# Patient Record
Sex: Male | Born: 1945 | Race: White | Hispanic: No | Marital: Married | Smoking: Former smoker
Health system: Southern US, Community
[De-identification: ages and names within clinical notes are randomized; demographics above are authoritative.]

## PROBLEM LIST (undated history)

## (undated) DIAGNOSIS — E7800 Pure hypercholesterolemia, unspecified: Secondary | ICD-10-CM

## (undated) DIAGNOSIS — R040 Epistaxis: Secondary | ICD-10-CM

## (undated) DIAGNOSIS — F419 Anxiety disorder, unspecified: Secondary | ICD-10-CM

## (undated) DIAGNOSIS — I10 Essential (primary) hypertension: Secondary | ICD-10-CM

## (undated) DIAGNOSIS — E669 Obesity, unspecified: Secondary | ICD-10-CM

## (undated) DIAGNOSIS — M1990 Unspecified osteoarthritis, unspecified site: Secondary | ICD-10-CM

## (undated) DIAGNOSIS — J439 Emphysema, unspecified: Secondary | ICD-10-CM

## (undated) DIAGNOSIS — R7303 Prediabetes: Secondary | ICD-10-CM

## (undated) DIAGNOSIS — Z8709 Personal history of other diseases of the respiratory system: Secondary | ICD-10-CM

## (undated) DIAGNOSIS — F17210 Nicotine dependence, cigarettes, uncomplicated: Secondary | ICD-10-CM

## (undated) DIAGNOSIS — I2510 Atherosclerotic heart disease of native coronary artery without angina pectoris: Secondary | ICD-10-CM

## (undated) DIAGNOSIS — C679 Malignant neoplasm of bladder, unspecified: Secondary | ICD-10-CM

## (undated) DIAGNOSIS — Z86010 Personal history of colonic polyps: Secondary | ICD-10-CM

## (undated) DIAGNOSIS — J449 Chronic obstructive pulmonary disease, unspecified: Secondary | ICD-10-CM

## (undated) HISTORY — DX: Personal history of other diseases of the respiratory system: Z87.09

## (undated) HISTORY — DX: Essential (primary) hypertension: I10

## (undated) HISTORY — DX: Chronic obstructive pulmonary disease, unspecified: J44.9

## (undated) HISTORY — DX: Pure hypercholesterolemia, unspecified: E78.00

## (undated) HISTORY — DX: Malignant neoplasm of bladder, unspecified: C67.9

## (undated) HISTORY — PX: PTCA: SHX146

## (undated) HISTORY — DX: Anxiety disorder, unspecified: F41.9

## (undated) HISTORY — DX: Unspecified osteoarthritis, unspecified site: M19.90

## (undated) HISTORY — DX: Prediabetes: R73.03

## (undated) HISTORY — DX: Personal history of colonic polyps: Z86.010

## (undated) HISTORY — DX: Epistaxis: R04.0

## (undated) HISTORY — DX: Nicotine dependence, cigarettes, uncomplicated: F17.210

## (undated) HISTORY — DX: Obesity, unspecified: E66.9

## (undated) HISTORY — DX: Atherosclerotic heart disease of native coronary artery without angina pectoris: I25.10

## (undated) HISTORY — DX: Emphysema, unspecified: J43.9

---

## 1994-11-15 HISTORY — PX: OTHER SURGICAL HISTORY: SHX169

## 1998-04-26 ENCOUNTER — Inpatient Hospital Stay (HOSPITAL_COMMUNITY): Admission: EM | Admit: 1998-04-26 | Discharge: 1998-05-01 | Payer: Self-pay | Admitting: Emergency Medicine

## 1998-07-10 ENCOUNTER — Ambulatory Visit: Admission: RE | Admit: 1998-07-10 | Discharge: 1998-07-10 | Payer: Self-pay | Admitting: Pulmonary Disease

## 2002-11-15 DIAGNOSIS — Z86010 Personal history of colonic polyps: Secondary | ICD-10-CM

## 2002-11-15 HISTORY — DX: Personal history of colonic polyps: Z86.010

## 2004-09-24 ENCOUNTER — Ambulatory Visit: Payer: Self-pay | Admitting: Gastroenterology

## 2004-10-20 ENCOUNTER — Ambulatory Visit: Payer: Self-pay | Admitting: Pulmonary Disease

## 2004-12-16 ENCOUNTER — Ambulatory Visit: Payer: Self-pay | Admitting: Pulmonary Disease

## 2005-01-14 ENCOUNTER — Ambulatory Visit: Payer: Self-pay | Admitting: Pulmonary Disease

## 2005-03-19 ENCOUNTER — Ambulatory Visit: Payer: Self-pay | Admitting: Pulmonary Disease

## 2005-07-20 ENCOUNTER — Ambulatory Visit: Payer: Self-pay | Admitting: Pulmonary Disease

## 2005-11-22 ENCOUNTER — Ambulatory Visit: Payer: Self-pay | Admitting: Pulmonary Disease

## 2005-11-26 ENCOUNTER — Ambulatory Visit: Payer: Self-pay | Admitting: Pulmonary Disease

## 2006-03-22 ENCOUNTER — Ambulatory Visit: Payer: Self-pay | Admitting: Pulmonary Disease

## 2006-05-04 ENCOUNTER — Ambulatory Visit: Payer: Self-pay | Admitting: Pulmonary Disease

## 2006-09-22 ENCOUNTER — Ambulatory Visit: Payer: Self-pay | Admitting: Pulmonary Disease

## 2006-12-19 ENCOUNTER — Ambulatory Visit: Payer: Self-pay | Admitting: Pulmonary Disease

## 2006-12-19 LAB — CONVERTED CEMR LAB
ALT: 32 units/L (ref 0–40)
AST: 27 units/L (ref 0–37)
Albumin: 3.9 g/dL (ref 3.5–5.2)
Alkaline Phosphatase: 43 units/L (ref 39–117)
BUN: 14 mg/dL (ref 6–23)
Bilirubin, Direct: 0.2 mg/dL (ref 0.0–0.3)
CO2: 32 meq/L (ref 19–32)
Calcium: 9.1 mg/dL (ref 8.4–10.5)
Chloride: 105 meq/L (ref 96–112)
Creatinine, Ser: 1.1 mg/dL (ref 0.4–1.5)
GFR calc Af Amer: 88 mL/min
GFR calc non Af Amer: 72 mL/min
Glucose, Bld: 122 mg/dL — ABNORMAL HIGH (ref 70–99)
Hgb A1c MFr Bld: 6.3 % — ABNORMAL HIGH (ref 4.6–6.0)
Potassium: 4.2 meq/L (ref 3.5–5.1)
Sodium: 143 meq/L (ref 135–145)
Total Bilirubin: 0.7 mg/dL (ref 0.3–1.2)
Total Protein: 7.2 g/dL (ref 6.0–8.3)

## 2007-01-09 ENCOUNTER — Ambulatory Visit: Payer: Self-pay | Admitting: Cardiology

## 2007-01-17 ENCOUNTER — Ambulatory Visit: Payer: Self-pay

## 2007-02-06 ENCOUNTER — Ambulatory Visit: Payer: Self-pay | Admitting: Cardiology

## 2007-02-06 LAB — CONVERTED CEMR LAB
BUN: 17 mg/dL (ref 6–23)
Basophils Absolute: 0 10*3/uL (ref 0.0–0.1)
Basophils Relative: 0.3 % (ref 0.0–1.0)
CO2: 27 meq/L (ref 19–32)
Calcium: 9.5 mg/dL (ref 8.4–10.5)
Chloride: 109 meq/L (ref 96–112)
Creatinine, Ser: 1 mg/dL (ref 0.4–1.5)
Eosinophils Absolute: 0.2 10*3/uL (ref 0.0–0.6)
Eosinophils Relative: 3.4 % (ref 0.0–5.0)
GFR calc Af Amer: 98 mL/min
GFR calc non Af Amer: 81 mL/min
Glucose, Bld: 121 mg/dL — ABNORMAL HIGH (ref 70–99)
HCT: 45.3 % (ref 39.0–52.0)
Hemoglobin: 15.4 g/dL (ref 13.0–17.0)
INR: 1 (ref 0.9–2.0)
Lymphocytes Relative: 28.6 % (ref 12.0–46.0)
MCHC: 34 g/dL (ref 30.0–36.0)
MCV: 96.9 fL (ref 78.0–100.0)
Monocytes Absolute: 0.6 10*3/uL (ref 0.2–0.7)
Monocytes Relative: 9.3 % (ref 3.0–11.0)
Neutro Abs: 3.9 10*3/uL (ref 1.4–7.7)
Neutrophils Relative %: 58.4 % (ref 43.0–77.0)
Platelets: 243 10*3/uL (ref 150–400)
Potassium: 4.5 meq/L (ref 3.5–5.1)
Prothrombin Time: 11.8 s (ref 10.0–14.0)
RBC: 4.68 M/uL (ref 4.22–5.81)
RDW: 12.9 % (ref 11.5–14.6)
Sodium: 146 meq/L — ABNORMAL HIGH (ref 135–145)
WBC: 6.6 10*3/uL (ref 4.5–10.5)
aPTT: 25.3 s — ABNORMAL LOW (ref 26.5–36.5)

## 2007-02-10 ENCOUNTER — Inpatient Hospital Stay (HOSPITAL_BASED_OUTPATIENT_CLINIC_OR_DEPARTMENT_OTHER): Admission: RE | Admit: 2007-02-10 | Discharge: 2007-02-10 | Payer: Self-pay | Admitting: Cardiology

## 2007-02-10 ENCOUNTER — Ambulatory Visit: Payer: Self-pay | Admitting: Cardiology

## 2007-02-14 ENCOUNTER — Ambulatory Visit (HOSPITAL_COMMUNITY): Admission: RE | Admit: 2007-02-14 | Discharge: 2007-02-15 | Payer: Self-pay | Admitting: Cardiology

## 2007-02-14 ENCOUNTER — Ambulatory Visit: Payer: Self-pay | Admitting: Cardiology

## 2007-02-23 ENCOUNTER — Ambulatory Visit: Payer: Self-pay | Admitting: Cardiology

## 2007-04-11 ENCOUNTER — Ambulatory Visit: Payer: Self-pay | Admitting: Pulmonary Disease

## 2007-07-18 ENCOUNTER — Ambulatory Visit: Payer: Self-pay

## 2007-07-25 ENCOUNTER — Ambulatory Visit: Payer: Self-pay | Admitting: Cardiology

## 2007-08-15 ENCOUNTER — Ambulatory Visit: Payer: Self-pay | Admitting: Pulmonary Disease

## 2007-08-15 LAB — CONVERTED CEMR LAB
ALT: 22 units/L (ref 0–53)
AST: 22 units/L (ref 0–37)
Albumin: 3.8 g/dL (ref 3.5–5.2)
Alkaline Phosphatase: 55 units/L (ref 39–117)
BUN: 13 mg/dL (ref 6–23)
Basophils Absolute: 0 10*3/uL (ref 0.0–0.1)
Basophils Relative: 0.3 % (ref 0.0–1.0)
Bilirubin, Direct: 0.2 mg/dL (ref 0.0–0.3)
CO2: 26 meq/L (ref 19–32)
Calcium: 9.2 mg/dL (ref 8.4–10.5)
Chloride: 105 meq/L (ref 96–112)
Cholesterol: 151 mg/dL (ref 0–200)
Creatinine, Ser: 0.8 mg/dL (ref 0.4–1.5)
Eosinophils Absolute: 0.1 10*3/uL (ref 0.0–0.6)
Eosinophils Relative: 2 % (ref 0.0–5.0)
GFR calc Af Amer: 126 mL/min
GFR calc non Af Amer: 104 mL/min
Glucose, Bld: 100 mg/dL — ABNORMAL HIGH (ref 70–99)
HCT: 38.7 % — ABNORMAL LOW (ref 39.0–52.0)
HDL: 23.1 mg/dL — ABNORMAL LOW (ref >39.0)
Hemoglobin: 13.6 g/dL (ref 13.0–17.0)
Hgb A1c MFr Bld: 6.1 % — ABNORMAL HIGH (ref 4.6–6.0)
LDL Cholesterol: 107 mg/dL — ABNORMAL HIGH (ref 0–99)
Lymphocytes Relative: 23.1 % (ref 12.0–46.0)
MCHC: 35.1 g/dL (ref 30.0–36.0)
MCV: 95.7 fL (ref 78.0–100.0)
Monocytes Absolute: 0.5 10*3/uL (ref 0.2–0.7)
Monocytes Relative: 8.3 % (ref 3.0–11.0)
Neutro Abs: 4.5 10*3/uL (ref 1.4–7.7)
Neutrophils Relative %: 66.3 % (ref 43.0–77.0)
PSA: 0.48 ng/mL (ref 0.10–4.00)
Platelets: 288 10*3/uL (ref 150–400)
Potassium: 3.7 meq/L (ref 3.5–5.1)
RBC: 4.04 M/uL — ABNORMAL LOW (ref 4.22–5.81)
RDW: 12.9 % (ref 11.5–14.6)
Sodium: 139 meq/L (ref 135–145)
Total Bilirubin: 1 mg/dL (ref 0.3–1.2)
Total CHOL/HDL Ratio: 6.5
Total Protein: 7.5 g/dL (ref 6.0–8.3)
Triglycerides: 106 mg/dL (ref 0–149)
VLDL: 21 mg/dL (ref 0–40)
WBC: 6.6 10*3/uL (ref 4.5–10.5)

## 2007-12-18 DIAGNOSIS — R7301 Impaired fasting glucose: Secondary | ICD-10-CM | POA: Insufficient documentation

## 2007-12-18 DIAGNOSIS — K2990 Gastroduodenitis, unspecified, without bleeding: Secondary | ICD-10-CM

## 2007-12-18 DIAGNOSIS — I10 Essential (primary) hypertension: Secondary | ICD-10-CM | POA: Insufficient documentation

## 2007-12-18 DIAGNOSIS — F419 Anxiety disorder, unspecified: Secondary | ICD-10-CM

## 2007-12-18 DIAGNOSIS — E669 Obesity, unspecified: Secondary | ICD-10-CM

## 2007-12-18 DIAGNOSIS — E7800 Pure hypercholesterolemia, unspecified: Secondary | ICD-10-CM

## 2007-12-18 DIAGNOSIS — D126 Benign neoplasm of colon, unspecified: Secondary | ICD-10-CM

## 2007-12-18 DIAGNOSIS — K2970 Gastritis, unspecified, without bleeding: Secondary | ICD-10-CM | POA: Insufficient documentation

## 2007-12-19 ENCOUNTER — Ambulatory Visit: Payer: Self-pay | Admitting: Pulmonary Disease

## 2007-12-19 DIAGNOSIS — I2510 Atherosclerotic heart disease of native coronary artery without angina pectoris: Secondary | ICD-10-CM | POA: Insufficient documentation

## 2007-12-19 DIAGNOSIS — J449 Chronic obstructive pulmonary disease, unspecified: Secondary | ICD-10-CM

## 2007-12-19 DIAGNOSIS — J019 Acute sinusitis, unspecified: Secondary | ICD-10-CM

## 2008-01-11 ENCOUNTER — Ambulatory Visit: Payer: Self-pay | Admitting: Gastroenterology

## 2008-03-14 ENCOUNTER — Telehealth (INDEPENDENT_AMBULATORY_CARE_PROVIDER_SITE_OTHER): Payer: Self-pay | Admitting: *Deleted

## 2008-06-20 ENCOUNTER — Ambulatory Visit: Payer: Self-pay | Admitting: Pulmonary Disease

## 2008-06-20 LAB — CONVERTED CEMR LAB
ALT: 31 units/L (ref 0–53)
AST: 28 units/L (ref 0–37)
Albumin: 4.2 g/dL (ref 3.5–5.2)
Alkaline Phosphatase: 40 units/L (ref 39–117)
BUN: 19 mg/dL (ref 6–23)
Basophils Absolute: 0.1 10*3/uL (ref 0.0–0.1)
Basophils Relative: 0.8 % (ref 0.0–3.0)
Bilirubin, Direct: 0.2 mg/dL (ref 0.0–0.3)
CO2: 26 meq/L (ref 19–32)
Calcium: 9.7 mg/dL (ref 8.4–10.5)
Chloride: 103 meq/L (ref 96–112)
Cholesterol: 142 mg/dL (ref 0–200)
Creatinine, Ser: 0.9 mg/dL (ref 0.4–1.5)
Eosinophils Absolute: 0.2 10*3/uL (ref 0.0–0.7)
Eosinophils Relative: 1.8 % (ref 0.0–5.0)
GFR calc Af Amer: 110 mL/min
GFR calc non Af Amer: 91 mL/min
Glucose, Bld: 107 mg/dL — ABNORMAL HIGH (ref 70–99)
HCT: 40.4 % (ref 39.0–52.0)
HDL: 28.2 mg/dL — ABNORMAL LOW (ref >39.0)
Hemoglobin: 14.3 g/dL (ref 13.0–17.0)
Hgb A1c MFr Bld: 6.2 % — ABNORMAL HIGH (ref 4.6–6.0)
LDL Cholesterol: 87 mg/dL (ref 0–99)
Lymphocytes Relative: 16.2 % (ref 12.0–46.0)
MCHC: 35.3 g/dL (ref 30.0–36.0)
MCV: 95.8 fL (ref 78.0–100.0)
Monocytes Absolute: 0.5 10*3/uL (ref 0.1–1.0)
Monocytes Relative: 6.3 % (ref 3.0–12.0)
Neutro Abs: 6.2 10*3/uL (ref 1.4–7.7)
Neutrophils Relative %: 74.9 % (ref 43.0–77.0)
PSA: 0.33 ng/mL (ref 0.10–4.00)
Platelets: 179 10*3/uL (ref 150–400)
Potassium: 3.9 meq/L (ref 3.5–5.1)
RBC: 4.22 M/uL (ref 4.22–5.81)
RDW: 12.5 % (ref 11.5–14.6)
Sodium: 142 meq/L (ref 135–145)
TSH: 1.11 microintl units/mL (ref 0.35–5.50)
Total Bilirubin: 1.2 mg/dL (ref 0.3–1.2)
Total CHOL/HDL Ratio: 5
Total Protein: 7.7 g/dL (ref 6.0–8.3)
Triglycerides: 135 mg/dL (ref 0–149)
VLDL: 27 mg/dL (ref 0–40)
WBC: 8.4 10*3/uL (ref 4.5–10.5)

## 2008-07-24 ENCOUNTER — Ambulatory Visit: Payer: Self-pay | Admitting: Cardiology

## 2008-12-17 ENCOUNTER — Telehealth (INDEPENDENT_AMBULATORY_CARE_PROVIDER_SITE_OTHER): Payer: Self-pay | Admitting: *Deleted

## 2008-12-20 ENCOUNTER — Ambulatory Visit: Payer: Self-pay | Admitting: Pulmonary Disease

## 2009-04-21 ENCOUNTER — Ambulatory Visit: Payer: Self-pay | Admitting: Pulmonary Disease

## 2009-04-21 LAB — CONVERTED CEMR LAB: Vit D, 25-Hydroxy: 54 ng/mL (ref 30–89)

## 2009-04-26 LAB — CONVERTED CEMR LAB
ALT: 29 units/L (ref 0–53)
AST: 29 units/L (ref 0–37)
Albumin: 4.2 g/dL (ref 3.5–5.2)
Alkaline Phosphatase: 44 units/L (ref 39–117)
BUN: 13 mg/dL (ref 6–23)
Basophils Absolute: 0 10*3/uL (ref 0.0–0.1)
Basophils Relative: 0.1 % (ref 0.0–3.0)
Bilirubin, Direct: 0.2 mg/dL (ref 0.0–0.3)
CO2: 29 meq/L (ref 19–32)
Calcium: 8.9 mg/dL (ref 8.4–10.5)
Chloride: 107 meq/L (ref 96–112)
Cholesterol: 151 mg/dL (ref 0–200)
Creatinine, Ser: 0.8 mg/dL (ref 0.4–1.5)
Eosinophils Absolute: 0.2 10*3/uL (ref 0.0–0.7)
Eosinophils Relative: 2.7 % (ref 0.0–5.0)
GFR calc non Af Amer: 103.43 mL/min (ref >60)
Glucose, Bld: 108 mg/dL — ABNORMAL HIGH (ref 70–99)
HCT: 43 % (ref 39.0–52.0)
HDL: 30.6 mg/dL — ABNORMAL LOW (ref >39.00)
Hemoglobin: 15.1 g/dL (ref 13.0–17.0)
Hgb A1c MFr Bld: 6.2 % (ref 4.6–6.5)
LDL Cholesterol: 97 mg/dL (ref 0–99)
Lymphocytes Relative: 25.7 % (ref 12.0–46.0)
Lymphs Abs: 1.6 10*3/uL (ref 0.7–4.0)
MCHC: 35 g/dL (ref 30.0–36.0)
MCV: 97.3 fL (ref 78.0–100.0)
Monocytes Absolute: 0.6 10*3/uL (ref 0.1–1.0)
Monocytes Relative: 10.1 % (ref 3.0–12.0)
Neutro Abs: 3.7 10*3/uL (ref 1.4–7.7)
Neutrophils Relative %: 61.4 % (ref 43.0–77.0)
PSA: 0.44 ng/mL (ref 0.10–4.00)
Platelets: 198 10*3/uL (ref 150.0–400.0)
Potassium: 3.7 meq/L (ref 3.5–5.1)
RBC: 4.42 M/uL (ref 4.22–5.81)
RDW: 12.6 % (ref 11.5–14.6)
Sodium: 143 meq/L (ref 135–145)
TSH: 1.15 microintl units/mL (ref 0.35–5.50)
Total Bilirubin: 1.3 mg/dL — ABNORMAL HIGH (ref 0.3–1.2)
Total CHOL/HDL Ratio: 5
Total Protein: 7.9 g/dL (ref 6.0–8.3)
Triglycerides: 117 mg/dL (ref 0.0–149.0)
VLDL: 23.4 mg/dL (ref 0.0–40.0)
WBC: 6.1 10*3/uL (ref 4.5–10.5)

## 2009-04-30 ENCOUNTER — Encounter (INDEPENDENT_AMBULATORY_CARE_PROVIDER_SITE_OTHER): Payer: Self-pay | Admitting: *Deleted

## 2009-07-17 ENCOUNTER — Ambulatory Visit: Payer: Self-pay | Admitting: Cardiology

## 2009-08-04 ENCOUNTER — Encounter: Payer: Self-pay | Admitting: Pulmonary Disease

## 2009-08-20 ENCOUNTER — Ambulatory Visit: Payer: Self-pay | Admitting: Pulmonary Disease

## 2009-08-20 ENCOUNTER — Telehealth (INDEPENDENT_AMBULATORY_CARE_PROVIDER_SITE_OTHER): Payer: Self-pay | Admitting: *Deleted

## 2009-08-20 ENCOUNTER — Inpatient Hospital Stay (HOSPITAL_COMMUNITY): Admission: AD | Admit: 2009-08-20 | Discharge: 2009-08-22 | Payer: Self-pay | Admitting: Pulmonary Disease

## 2009-08-20 LAB — CONVERTED CEMR LAB
Basophils Absolute: 0 10*3/uL (ref 0.0–0.1)
Basophils Relative: 0 % (ref 0.0–3.0)
Eosinophils Absolute: 0.2 10*3/uL (ref 0.0–0.7)
Eosinophils Relative: 1.7 % (ref 0.0–5.0)
HCT: 44 % (ref 39.0–52.0)
Hemoglobin: 15.1 g/dL (ref 13.0–17.0)
Lymphocytes Relative: 17.7 % (ref 12.0–46.0)
Lymphs Abs: 1.7 10*3/uL (ref 0.7–4.0)
MCHC: 34.3 g/dL (ref 30.0–36.0)
MCV: 99 fL (ref 78.0–100.0)
Monocytes Absolute: 0.6 10*3/uL (ref 0.1–1.0)
Monocytes Relative: 6.6 % (ref 3.0–12.0)
Neutro Abs: 7.1 10*3/uL (ref 1.4–7.7)
Neutrophils Relative %: 74 % (ref 43.0–77.0)
Platelets: 230 10*3/uL (ref 150.0–400.0)
RBC: 4.44 M/uL (ref 4.22–5.81)
RDW: 12.7 % (ref 11.5–14.6)
WBC: 9.6 10*3/uL (ref 4.5–10.5)

## 2009-08-22 ENCOUNTER — Encounter: Payer: Self-pay | Admitting: Pulmonary Disease

## 2009-08-27 ENCOUNTER — Ambulatory Visit: Payer: Self-pay | Admitting: Pulmonary Disease

## 2009-09-16 ENCOUNTER — Encounter: Payer: Self-pay | Admitting: Pulmonary Disease

## 2009-10-20 ENCOUNTER — Ambulatory Visit: Payer: Self-pay | Admitting: Pulmonary Disease

## 2009-10-28 ENCOUNTER — Encounter: Payer: Self-pay | Admitting: Pulmonary Disease

## 2009-11-15 DIAGNOSIS — C679 Malignant neoplasm of bladder, unspecified: Secondary | ICD-10-CM

## 2009-11-15 HISTORY — DX: Malignant neoplasm of bladder, unspecified: C67.9

## 2010-03-18 ENCOUNTER — Telehealth (INDEPENDENT_AMBULATORY_CARE_PROVIDER_SITE_OTHER): Payer: Self-pay | Admitting: *Deleted

## 2010-03-26 ENCOUNTER — Ambulatory Visit: Payer: Self-pay | Admitting: Pulmonary Disease

## 2010-03-28 DIAGNOSIS — M1990 Unspecified osteoarthritis, unspecified site: Secondary | ICD-10-CM | POA: Insufficient documentation

## 2010-03-30 ENCOUNTER — Ambulatory Visit: Payer: Self-pay | Admitting: Pulmonary Disease

## 2010-04-05 LAB — CONVERTED CEMR LAB
ALT: 42 units/L (ref 0–53)
AST: 34 units/L (ref 0–37)
Albumin: 4.1 g/dL (ref 3.5–5.2)
Alkaline Phosphatase: 42 units/L (ref 39–117)
BUN: 19 mg/dL (ref 6–23)
Basophils Absolute: 0 10*3/uL (ref 0.0–0.1)
Basophils Relative: 0.2 % (ref 0.0–3.0)
Bilirubin, Direct: 0.2 mg/dL (ref 0.0–0.3)
CO2: 29 meq/L (ref 19–32)
Calcium: 8.9 mg/dL (ref 8.4–10.5)
Chloride: 104 meq/L (ref 96–112)
Cholesterol: 144 mg/dL (ref 0–200)
Creatinine, Ser: 0.9 mg/dL (ref 0.4–1.5)
Direct LDL: 88.7 mg/dL
Eosinophils Absolute: 0.2 10*3/uL (ref 0.0–0.7)
Eosinophils Relative: 3.5 % (ref 0.0–5.0)
GFR calc non Af Amer: 94.87 mL/min (ref >60)
Glucose, Bld: 121 mg/dL — ABNORMAL HIGH (ref 70–99)
HCT: 42 % (ref 39.0–52.0)
HDL: 26 mg/dL — ABNORMAL LOW (ref >39.00)
Hemoglobin: 14.9 g/dL (ref 13.0–17.0)
Hgb A1c MFr Bld: 6.7 % — ABNORMAL HIGH (ref 4.6–6.5)
Lymphocytes Relative: 27.7 % (ref 12.0–46.0)
Lymphs Abs: 1.7 10*3/uL (ref 0.7–4.0)
MCHC: 35.4 g/dL (ref 30.0–36.0)
MCV: 97.5 fL (ref 78.0–100.0)
Monocytes Absolute: 0.7 10*3/uL (ref 0.1–1.0)
Monocytes Relative: 10.8 % (ref 3.0–12.0)
Neutro Abs: 3.5 10*3/uL (ref 1.4–7.7)
Neutrophils Relative %: 57.8 % (ref 43.0–77.0)
PSA: 0.3 ng/mL (ref 0.10–4.00)
Platelets: 198 10*3/uL (ref 150.0–400.0)
Potassium: 4.9 meq/L (ref 3.5–5.1)
RBC: 4.31 M/uL (ref 4.22–5.81)
RDW: 13.6 % (ref 11.5–14.6)
Sodium: 139 meq/L (ref 135–145)
TSH: 1.65 microintl units/mL (ref 0.35–5.50)
Total Bilirubin: 1 mg/dL (ref 0.3–1.2)
Total CHOL/HDL Ratio: 6
Total Protein: 7.2 g/dL (ref 6.0–8.3)
Triglycerides: 215 mg/dL — ABNORMAL HIGH (ref 0.0–149.0)
VLDL: 43 mg/dL — ABNORMAL HIGH (ref 0.0–40.0)
WBC: 6.1 10*3/uL (ref 4.5–10.5)

## 2010-05-14 ENCOUNTER — Ambulatory Visit: Payer: Self-pay | Admitting: Pulmonary Disease

## 2010-05-14 ENCOUNTER — Telehealth (INDEPENDENT_AMBULATORY_CARE_PROVIDER_SITE_OTHER): Payer: Self-pay | Admitting: *Deleted

## 2010-05-14 ENCOUNTER — Ambulatory Visit: Payer: Self-pay | Admitting: Internal Medicine

## 2010-05-14 DIAGNOSIS — R319 Hematuria, unspecified: Secondary | ICD-10-CM

## 2010-05-14 LAB — CONVERTED CEMR LAB
Bilirubin Urine: NEGATIVE
Ketones, ur: NEGATIVE mg/dL
Leukocytes, UA: NEGATIVE
Nitrite: NEGATIVE
Specific Gravity, Urine: 1.015 (ref 1.000–1.030)
Total Protein, Urine: 30 mg/dL
Urine Glucose: NEGATIVE mg/dL
Urobilinogen, UA: 0.2 (ref 0.0–1.0)
pH: 8 (ref 5.0–8.0)

## 2010-05-20 ENCOUNTER — Ambulatory Visit: Payer: Self-pay | Admitting: Pulmonary Disease

## 2010-05-22 LAB — CONVERTED CEMR LAB
Bilirubin Urine: NEGATIVE
Ketones, ur: NEGATIVE mg/dL
Leukocytes, UA: NEGATIVE
Nitrite: NEGATIVE
Specific Gravity, Urine: <=1.005 (ref 1.000–1.030)
Total Protein, Urine: NEGATIVE mg/dL
Urine Glucose: NEGATIVE mg/dL
Urobilinogen, UA: 0.2 (ref 0.0–1.0)
pH: 7 (ref 5.0–8.0)

## 2010-06-30 ENCOUNTER — Ambulatory Visit: Payer: Self-pay | Admitting: Pulmonary Disease

## 2010-06-30 LAB — CONVERTED CEMR LAB
Bilirubin Urine: NEGATIVE
Ketones, ur: NEGATIVE mg/dL
Leukocytes, UA: NEGATIVE
Nitrite: NEGATIVE
Specific Gravity, Urine: <=1.005 (ref 1.000–1.030)
Total Protein, Urine: NEGATIVE mg/dL
Urine Glucose: NEGATIVE mg/dL
Urobilinogen, UA: 0.2 (ref 0.0–1.0)
pH: 7 (ref 5.0–8.0)

## 2010-07-02 ENCOUNTER — Telehealth (INDEPENDENT_AMBULATORY_CARE_PROVIDER_SITE_OTHER): Payer: Self-pay | Admitting: *Deleted

## 2010-07-02 ENCOUNTER — Encounter: Payer: Self-pay | Admitting: Adult Health

## 2010-07-15 ENCOUNTER — Encounter: Payer: Self-pay | Admitting: Adult Health

## 2010-07-16 ENCOUNTER — Telehealth: Payer: Self-pay | Admitting: Adult Health

## 2010-07-17 ENCOUNTER — Ambulatory Visit: Payer: Self-pay | Admitting: Cardiology

## 2010-07-24 ENCOUNTER — Ambulatory Visit (HOSPITAL_BASED_OUTPATIENT_CLINIC_OR_DEPARTMENT_OTHER): Admission: RE | Admit: 2010-07-24 | Discharge: 2010-07-24 | Payer: Self-pay | Admitting: Urology

## 2010-08-03 ENCOUNTER — Encounter: Payer: Self-pay | Admitting: Adult Health

## 2010-09-07 ENCOUNTER — Telehealth (INDEPENDENT_AMBULATORY_CARE_PROVIDER_SITE_OTHER): Payer: Self-pay | Admitting: *Deleted

## 2010-09-14 ENCOUNTER — Ambulatory Visit (HOSPITAL_BASED_OUTPATIENT_CLINIC_OR_DEPARTMENT_OTHER): Admission: RE | Admit: 2010-09-14 | Discharge: 2010-09-14 | Payer: Self-pay | Admitting: Urology

## 2010-09-21 ENCOUNTER — Encounter: Payer: Self-pay | Admitting: Adult Health

## 2010-09-22 ENCOUNTER — Ambulatory Visit: Payer: Self-pay | Admitting: Pulmonary Disease

## 2010-09-22 DIAGNOSIS — C679 Malignant neoplasm of bladder, unspecified: Secondary | ICD-10-CM

## 2010-11-27 ENCOUNTER — Ambulatory Visit
Admission: RE | Admit: 2010-11-27 | Discharge: 2010-11-27 | Payer: Self-pay | Source: Home / Self Care | Attending: Urology | Admitting: Urology

## 2010-11-30 LAB — POCT I-STAT 4, (NA,K, GLUC, HGB,HCT)
Glucose, Bld: 112 mg/dL — ABNORMAL HIGH (ref 70–99)
HCT: 45 % (ref 39.0–52.0)
Hemoglobin: 15.3 g/dL (ref 13.0–17.0)
Potassium: 4.4 mEq/L (ref 3.5–5.1)
Sodium: 143 mEq/L (ref 135–145)

## 2010-12-08 ENCOUNTER — Encounter: Payer: Self-pay | Admitting: Adult Health

## 2010-12-17 NOTE — Assessment & Plan Note (Signed)
Summary: NP follow up - hematuria   Primary Provider/Referring Provider:  Kriste Basque, MD  CC:  5 day follow up - states symptoms have completely resolved.  History of Present Illness: 65 yo male with known hx of HTN, CAD    ~  Dec10:  he's been stable- no renewed epistaxis etc... he saw DrWolicki who rec a sleep study, but pt cancelled this... he tells me he is trying the "e-cigarette" & cut down his regular cigs to 1/4ppd... BP controlled, back on Plavix now and no angina   ~  Mar 26, 2010:  stable 33mo- but still smoking 5-10 cig/d, hasn't lost weight (actually gained 6#), not exercising etc... BP controlled- denies CP,worsening dyspnea, etc... due for CXR & fasting labs...  May 14, 2010--Presents for work in visit today. Had episode of bloody urine x1 episode yesterday with extreme pain, and states has had some a lesser degree of bloody urine since with small blood clots 4-5x with no pain.  also some right kidney achiness since onset. No discharge, dysuria, abd pain, n/v. Has seen blood in urine today but not as much pain. UA today positive for blood   May 21, 2010--Returns for 5 day follow up - states symptoms have completely resolved. Was seen last visit w/ gross hematuria. KUB neg for renal calculi. UA positive for bld. He was tx for possible UTI +/- kidney stone. w/ fluids and cipro. He is much improved w/ no futher visible blood. He says shortly after leaving our office last week, he urinated w/ passage of small pebble w/ resolution of pain and hematuria.  Denies chest pain, dyspnea, orthopnea, hemoptysis, fever, n/v/d, edema, headache,back pain .   Medications Prior to Update: 1)  Nasonex 50 Mcg/act  Susp (Mometasone Furoate) .... 2 Spray Each Nostril Two Times A Day 2)  Advair Diskus 250-50 Mcg/dose  Misc (Fluticasone-Salmeterol) .... I Puff Two Times A Day, Rinse Mouth 3)  Bufferin Low Dose 81 Mg Tbec (Aspirin) .... Take 1 Tab By Mouth Once Daily.Marland KitchenMarland Kitchen 4)  Plavix 75 Mg  Tabs (Clopidogrel  Bisulfate) .... Take 1 Tab By Mouth Once Daily.Marland KitchenMarland Kitchen 5)  Nitroglycerin 0.4 Mg Subl (Nitroglycerin) .... One Tablet Under Tongue Every 5 Minutes As Needed For Chest Pain---May Repeat Times Three 6)  Metoprolol Tartrate 25 Mg  Tabs (Metoprolol Tartrate) .... Take One Tablet By Mouth Three Times A Day 7)  Amlodipine Besylate 10 Mg Tabs (Amlodipine Besylate) .... Take 1 Tablet By Mouth Once A Day 8)  Lisinopril 40 Mg Tabs (Lisinopril) .... Take 1 Tablet By Mouth Once A Day 9)  Simvastatin 80 Mg  Tabs (Simvastatin) .... Take 1 Tablet By Mouth Once A Day 10)  Fish Oil 1200 Mg  Caps (Omega-3 Fatty Acids) .... Take 1 Tablet By Mouth Once A Day 11)  Flaxseed Oil 1000 Mg  Caps (Flaxseed (Linseed)) .... Take 1 Tablet By Mouth Once A Day 12)  Ranitidine Hcl 300 Mg Caps (Ranitidine Hcl) .... Take 1 Tab By Mouth Once Daily For Stomach Acid... 13)  Multivitamins   Tabs (Multiple Vitamin) .... Take 1 Tablet By Mouth Once A Day 14)  Clorazepate Dipotassium 7.5 Mg  Tabs (Clorazepate Dipotassium) .... Take 3 Tablet By Mouth As Needed 15)  Icaps  Caps (Multiple Vitamins-Minerals) .... Take One Capsule By Mouth Two Times A Day 16)  Tylenol Arthritis Pain 650 Mg Cr-Tabs (Acetaminophen) .... Per Bottle 17)  Cipro 500 Mg Tabs (Ciprofloxacin Hcl) .[name].. 1 By Mouth Two Times A Day  Current  Medications (verified): 1)  Nasonex 50 Mcg/act  Susp (Mometasone Furoate) .... 2 Spray Each Nostril Two Times A Day 2)  Advair Diskus 250-50 Mcg/dose  Misc (Fluticasone-Salmeterol) .... I Puff Two Times A Day, Rinse Mouth 3)  Bufferin Low Dose 81 Mg Tbec (Aspirin) .... Take 1 Tab By Mouth Once Daily.Marland KitchenMarland Kitchen 4)  Plavix 75 Mg  Tabs (Clopidogrel Bisulfate) .... Take 1 Tab By Mouth Once Daily.Marland KitchenMarland Kitchen 5)  Nitroglycerin 0.4 Mg Subl (Nitroglycerin) .... One Tablet Under Tongue Every 5 Minutes As Needed For Chest Pain---May Repeat Times Three 6)  Metoprolol Tartrate 25 Mg  Tabs (Metoprolol Tartrate) .... Take One Tablet By Mouth Three Times A Day 7)   Amlodipine Besylate 10 Mg Tabs (Amlodipine Besylate) .... Take 1 Tablet By Mouth Once A Day 8)  Lisinopril 40 Mg Tabs (Lisinopril) .... Take 1 Tablet By Mouth Once A Day 9)  Simvastatin 80 Mg  Tabs (Simvastatin) .... Take 1 Tablet By Mouth Once A Day 10)  Fish Oil 1200 Mg  Caps (Omega-3 Fatty Acids) .... Take 1 Tablet By Mouth Once A Day 11)  Flaxseed Oil 1000 Mg  Caps (Flaxseed (Linseed)) .... Take 1 Tablet By Mouth Once A Day 12)  Ranitidine Hcl 300 Mg Caps (Ranitidine Hcl) .... Take 1 Tab By Mouth Once Daily For Stomach Acid... 13)  Multivitamins   Tabs (Multiple Vitamin) .... Take 1 Tablet By Mouth Once A Day 14)  Clorazepate Dipotassium 7.5 Mg  Tabs (Clorazepate Dipotassium) .... Take 3 Tablet By Mouth As Needed 15)  Icaps  Caps (Multiple Vitamins-Minerals) .... Take One Capsule By Mouth Two Times A Day 16)  Tylenol Arthritis Pain 650 Mg Cr-Tabs (Acetaminophen) .... Per Bottle 17)  Cipro 500 Mg Tabs (Ciprofloxacin Hcl) .[name].. 1 By Mouth Two Times A Day  Allergies (verified): 1)  ! 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 Coronary artery disease, status post remote diaphragmatic wall     infarction in 1995 treated with stenting in the right coronary     artery with subsequent overlapping stents placed in the right     coronary artery and recently 3 non-overlapping Cypher stents placed     in the proximal mid and distal right coronary artery in 2008 --total 6 stents 2. Good left ventricular function. 3. Hypertension. 4. Hyperlipidemia. 5. Tobacco use.   Past Surgical  History: Last updated: 03/26/2010 S/P Cataract Surg & subseq posterior vitrectomy in Left eye by DrRankin 1996  Family History: Last updated: 07/04/08 mother deceased age 70  from heart problems father deceased age 35 from cancer 1 sibling alive age 89 1 sibling alive age 74  Social History: Last updated: 04-Jul-2008 current smoker  1/2 ppd exercises with work drinks caffeine--3-4 cups per day  no etoh married 2 children  Risk Factors: Smoking Status: current (12/18/2007)  Review of Systems      See HPI  Vital Signs:  Patient profile:   65 year old male Height:      67 inches Weight:      242 pounds BMI:     38.04 O2 Sat:      93 % on Room air Temp:     98.7 degrees F oral Pulse rate:   76 / minute BP sitting:  104 / 72  (left arm) Cuff size:   regular  Vitals Entered By: Boone Master CNA/MA (May 20, 2010 10:14 AM)  O2 Flow:  Room air CC: 5 day follow up - states symptoms have completely resolved Is Patient Diabetic? No Comments Medications reviewed with patient Daytime contact number verified with patient. Boone Master CNA/MA  May 20, 2010 10:15 AM    Physical Exam  Additional Exam:  General: well developed adult HEENT: [place]/AT.  Mucus membranes pink/moist.  No JVD.  No lymphadenopathy.    Neuro: Awake, alert, oriented.  CV: S1S2 RRR, no murmurs/rubs/gallops.  Radial pulses +2.   Pulm: Resp's shallow, tachypnic.  Lungs bilaterally diminished.  GI: abdomen round/soft, non-tender to palpation.  BSx4 active. No organomegaly., neg CVA tenderness. no guarding or rebound.  Skin: no rashes or lesions Ext: warm/dry.  No edema      Impression & Recommendations:  Problem # 1:  HEMATURIA UNSPECIFIED (ICD-599.70) Suspect secondary to renal calculi (w/ visible passage of stone"pebble".  UA shows some residual blood will need to have follow up UA in 1-2 months to verifiy clearance.  REC:  kidney stone education given.  advised on increaesing fluids.  follow  up for repeat urine in 4 week.    Orders: TLB-Udip ONLY ([id number]) Est. Patient Level II (47829)  Complete Medication List: 1)  Nasonex 50 Mcg/act Susp (Mometasone furoate) .... 2 spray each nostril two times a day 2)  Advair Diskus 250-50 Mcg/dose Misc (Fluticasone-salmeterol) .... I puff two times a day, rinse mouth 3)  Bufferin Low Dose 81 Mg Tbec (Aspirin) .... Take 1 tab by mouth once daily.Marland KitchenMarland Kitchen 4)  Plavix 75 Mg Tabs (Clopidogrel bisulfate) .... Take 1 tab by mouth once daily.Marland KitchenMarland Kitchen 5)  Nitroglycerin 0.4 Mg Subl (Nitroglycerin) .... One tablet under tongue every 5 minutes as needed for chest pain---may repeat times three 6)  Metoprolol Tartrate 25 Mg Tabs (Metoprolol tartrate) .... Take one tablet by mouth three times a day 7)  Amlodipine Besylate 10 Mg Tabs (Amlodipine besylate) .... Take 1 tablet by mouth once a day 8)  Lisinopril 40 Mg Tabs (Lisinopril) .... Take 1 tablet by mouth once a day 9)  Simvastatin 80 Mg Tabs (Simvastatin) .... Take 1 tablet by mouth once a day 10)  Fish Oil 1200 Mg Caps (Omega-3 fatty acids) .... Take 1 tablet by mouth once a day 11)  Flaxseed Oil 1000 Mg Caps (Flaxseed (linseed)) .... Take 1 tablet by mouth once a day 12)  Ranitidine Hcl 300 Mg Caps (Ranitidine hcl) .... Take 1 tab by mouth once daily for stomach acid... 13)  Multivitamins Tabs (Multiple vitamin) .... Take 1 tablet by mouth once a day 14)  Clorazepate Dipotassium 7.5 Mg Tabs (Clorazepate dipotassium) .... Take 3 tablet by mouth as needed 15)  Icaps Caps (Multiple vitamins-minerals) .... Take one capsule by mouth two times a day 16)  Tylenol Arthritis Pain 650 Mg Cr-tabs (Acetaminophen) .... Per bottle 17)  Cipro 500 Mg Tabs (Ciprofloxacin hcl) .[name].. 1 by mouth two times a day  Patient Instructions: 1)  We are repeating your urine today , I will call with results.  2)  Increase fluids, drink lots of water.  3)  Please contact office for sooner follow up if symptoms do not improve or worsen

## 2010-12-17 NOTE — Letter (Signed)
Summary: [facility]  [facility]   Imported By: Lester [place] 08/11/2010 08:29:00  _____________________________________________________________________  External Attachment:    Type:   Image     Comment:   External Document

## 2010-12-17 NOTE — Progress Notes (Signed)
Summary: hematuria/ pt in pain  Phone Note Call from Patient Call back at Home Phone [phone]   Caller: Spouse Call For: tammy parrett Summary of Call: spouse states that pt is still passing blood while urinating. today unable to urinate at all. in pain. still waiting for referral to urologist. please advise asap today.  Initial call taken by: Tivis Ringer, CNA,  July 02, 2010 11:21 AM  Follow-up for Phone Call        Called Dr. Vernie Ammons pt to be seen today at 3pm today. Pt's wife is aware to be at Dr.Ottelin's office at 245pm.Katie [facility] CMA  July 02, 2010 11:33 AM

## 2010-12-17 NOTE — Progress Notes (Signed)
Summary: talk to nurse  Phone Note Call from Patient Call back at Home Phone [phone]   Caller: Patient Call For: nadel Summary of Call: Pt states he has been passing blood since yesterday evening, pls advise.//pleasasnt garden Initial call taken by: Darletta Moll,  May 14, 2010 9:03 AM  Follow-up for Phone Call        Spoke with pt.  He states that yesterday he had the urge to urinate and had trouble with this.  When he finally did urine had blood in it.  I advised needs ov for eval.    Pt needs to come in at 2:30 to have udip and cx done.  Spoke with him and he is okay with this.  Labs ordered in IDX STAT. Follow-up by: Vernie Murders,  May 14, 2010 9:22 AM  New Problems: HEMATURIA UNSPECIFIED (ICD-599.70)   New Problems: HEMATURIA UNSPECIFIED (ICD-599.70)

## 2010-12-17 NOTE — Progress Notes (Signed)
Summary: thanks  Phone Note Call from Patient Call back at Home Phone [phone]   Caller: Spouse//rita Call For: parrett Summary of Call: Wanted to thank TP and Katheren Shams. for taking care of her husband and getting him an appt with Dr. Vernie Ammons. Initial call taken by: Darletta Moll,  July 16, 2010 3:00 PM  Follow-up for Phone Call        great  Follow-up by: Rubye Oaks NP,  July 16, 2010 3:12 PM

## 2010-12-17 NOTE — Assessment & Plan Note (Signed)
Summary: [name] ///kp   Primary Care Provider:  Kriste Basque, MD  CC:  5 month [name] & review of mult medical problems....  History of Present Illness: 65 y/o WM here for a follow up visit and on-going management of mult med problems...    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  Unfortunately he continues to smoke 1/2ppd- denies CP, palpit, or change in SOB/ DOE... no new complaints or concerns... he has been caring for his wife who had a TKR recently...  ~  Oct10:  Adm 10/10 w/ epistaxis ?hemoptysis- CXR= NAD, labs OK, seen by DrWolicki w/ rec for saline lavage.... nose bleed resolved & disch home... improved now- & he reports no further problems... he is anxious to restart his Plavix since DrBrodie stressed to him how important this med was (I indicated to him that smoking cessation was even MORE important)...  ~  Dec10:  he's been stable- no renewed epistaxis etc... he saw DrWolicki who rec a sleep study, but pt cancelled this... he tells me he is trying the "e-cigarette" & cut down his regular cigs to 1/4ppd... BP controlled, back on Plavix now and no angina reported...    ~  Mar 26, 2010:  stable 46mo- but still smoking 5-10 cig/d, hasn't lost weight (actually gained 6#), not exercising etc... BP controlled- denies CP,worsening dyspnea, etc... due for CXR & fasting labs...    Current Problem List:  Hx of SINUSITIS (ICD-473.9) - on NASONEX 2spQhs & recent Rx w/ Augmentin/ Saline... notes occas sinus congestion, drainage, sl HA, etc... he knows that he needs to quit smoking!  ~  10/10: eval for epistaxis by DrWolicki- stable, no recurrence...   COPD (ICD-496) - STILL SMOKES 5-10 cig/d as above... He's refused Chantix... on ADVAIR 250Bid... episode of hemoptysis 6/99 without lesion on CXR, CTChest, or Bronchoscopy...  ~  Adm 10/10 w/ epistaxsis & eval DrWolicki as noted...  HYPERTENSION (ICD-401.9) - controlled on METOPROLOL 25mg Tid,  NORVASC  10mg /d, LISINOPRIL 40mg /d...  BP=140/80, not checking BP's at home... wt back up to 244#... denies HA, fatigue, visual changes, CP, palipit, dizziness, syncope, dyspnea, edema, etc...   CORONARY ARTERY DISEASE (ICD-414.00) - ASA 81mg /d & PLAVIX 75mg /d as before... 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 Myoview 4/08 abnormal w/ inferior ischemia and subseq cath revealing more restenoses in RCA- s/p repeat PTCA/ stents... f/u Myoview 9/08 w/ prev infer infarct, no ischemia, EF=56%...  ~  08/27/09:  OK to restart Plavix & watch for any incr bleeding...  HYPERCHOLESTEROLEMIA (ICD-272.0) - on SIMVASTATIN 80 now + Fish Oil & Red Wine per DrBB- not really on diet, not exercising & we discussed this again today!  ~  FLP 9/08 on Simva40 showed TChol 151, TG 106, HDL 23, LDL 107  ~  FLP 8/09 showed TChol 142, TG 135, HDL 28, LDL 87  ~  FLP 6/10 showed TChol 151, TG 117, HDL 31, LDL 97  ~  FLP in hosp 10/10 showed TChol 128, TG 110, HDL 26, LDL 80  ~  FLP 5/11 showed =   DIABETES MELLITUS, BORDERLINE (ICD-790.29) - on diet alone...  ~  labs 9/08 showed BS=100, HgA1c=6.1  ~  labs 8/09 showed BS= 107, A1c= 6.2  ~  labs 6/10 showed BS= 108, A1c= 6.2  ~  labd 5/11 showed =   OBESITY (ICD-278.00) - weight was down as low as  202# in 2/09, and steadily incr to 244# now... we reviewed diet + exercise necessary to lose weight...  GASTRITIS (ICD-535.50) - had EGD 10/05 showing gastritis... on RANITADINE 300mg /d due to his Plavix Rx, off prev Omep.  COLONIC POLYPS (ICD-211.3) - last colonoscopy by DrStark 3/04 showed 7mm polyp = hyperplastic w/ f/u planned 31yrs..  DEGENERATIVE JOINT DISEASE (ICD-715.90) - he has right shoulder pain, also hip & knee pain, Rx ADVIL w/ some benefit... offered Ortho referral & he will decide.  ANXIETY (ICD-300.00) - on CHLORAZEPATE 7.5mg  Prn...   Allergies: 1)  ! * Mucinex Dm  Comments:  Nurse/Medical Assistant: The patient's medications and  allergies were reviewed with the patient and were updated in the Medication and Allergy Lists.  Past History:  Past Medical History: Hx of SINUSITIS (ICD-473.9) EPISTAXIS (ICD-784.7) COPD (ICD-496) CIGARETTE SMOKER (ICD-305.1) HYPERTENSION (ICD-401.9) CORONARY ARTERY DISEASE (ICD-414.00) HYPERCHOLESTEROLEMIA (ICD-272.0) DIABETES MELLITUS, BORDERLINE (ICD-790.29) OBESITY (ICD-278.00) GASTRITIS (ICD-535.50) COLONIC POLYPS (ICD-211.3) DEGENERATIVE JOINT DISEASE (ICD-715.90) ANXIETY (ICD-300.00)  1. Coronary artery disease, status post remote diaphragmatic wall     infarction in 1995 treated with stenting in the right coronary     artery with subsequent overlapping stents placed in the right     coronary artery and recently 3 non-overlapping Cypher stents placed     in the proximal mid and distal right coronary artery in 2008 --total 6 stents 2. Good left ventricular function. 3. Hypertension. 4. Hyperlipidemia. 5. Tobacco use.   Past Surgical History: S/P Cataract Surg & subseq posterior vitrectomy in Left eye by DrRankin 1996  Family History: Reviewed history from 06/20/2008 and no changes required. mother deceased age 42  from heart problems father deceased age 30 from cancer 1 sibling alive age 56 1 sibling alive age 33  Social History: Reviewed history from 06/20/2008 and no changes required. current smoker  1/2 ppd exercises with work drinks caffeine--3-4 cups per day  no etoh married 2 children  Review of Systems      See HPI       The patient complains of dyspnea on exertion and difficulty walking.  The patient denies anorexia, fever, weight loss, weight gain, vision loss, decreased hearing, hoarseness, chest pain, syncope, peripheral edema, prolonged cough, headaches, hemoptysis, abdominal pain, melena, hematochezia, severe indigestion/heartburn, hematuria, incontinence, muscle weakness, suspicious skin lesions, transient blindness, depression, unusual weight  change, abnormal bleeding, enlarged lymph nodes, and angioedema.    Vital Signs:  Patient profile:   65 year old male Height:      67 inches Weight:      244 pounds BMI:     38.35 O2 Sat:      94 % on Room air Temp:     97.9 degrees F oral Pulse rate:   68 / minute BP sitting:   140 / 80  (left arm) Cuff size:   large  Vitals Entered By: Randell Loop CMA (Mar 26, 2010 9:05 AM)  O2 Sat at Rest %:  94 O2 Flow:  Room air CC: 5 month [name] & review of mult medical problems... Is Patient Diabetic? Yes Pain Assessment Patient in pain? yes      Comments no changes in meds--pt pays out of pocket for his generic meds at [facility] in [place] and uses [facility] for his non-generic meds   Physical Exam  Additional Exam:  WD, Obese, 65 y/o WM in NAD... GENERAL:  Alert & oriented; pleasant & cooperative... HEENT:  [place]/AT, EOM-wnl, PERRLA, EACs-clear, TMs-wnl, NOSE- sl congestion, THROAT-clear &  wnl. NECK:  Supple w/ fairROM; no JVD; normal carotid impulses w/o bruits; no thyromegaly or nodules palpated; no lymphadenopathy. CHEST:  Clear to P & A; without wheezes/ rales/ or rhonchi heard... HEART:  Regular Rhythm; without murmurs/ rubs/ or gallops detected... ABDOMEN:  Obese, soft, & nontender; normal bowel sounds; no organomegaly or masses detected. EXT: without deformities, mild arthritic changes; no varicose veins/ +venous insuffic/ no edema. NEURO:  CN's intact; no focal neuro deficits... DERM:  No lesions noted; no rash etc...    MISC. Report  Procedure date:  03/26/2010  Findings:      CHEST - 2 VIEW Comparison: 08/21/2009   Findings: Bullous emphysema again noted.  No evidence of pulmonary infiltrate or edema.  No evidence of pleural effusion.  Heart size and mediastinal contours are within normal limits.  No mass or adenopathy identified.   IMPRESSION: Bullous emphysema.  No active disease.   Read By:  Danae Orleans,  M.D.   MISC. Report  Procedure date:   03/26/2010  Findings:      PT will return next week for fasting blood work & we will communicate the results to him...  SN   Impression & Recommendations:  Problem # 1:  COPD (ICD-496) He must quit all smoking but is not motivated to quit... we discussed smoking cessation strategies but he declines Chantix etc. His updated medication list for this problem includes:    Advair Diskus 250-50 Mcg/dose Misc (Fluticasone-salmeterol) ..... I puff two times a day, rinse mouth  Orders: T-2 View CXR (71020TC)  Problem # 2:  HYPERTENSION (ICD-401.9) Controlled>  same meds. His updated medication list for this problem includes:    Metoprolol Tartrate 25 Mg Tabs (Metoprolol tartrate) .[name]... Take one tablet by mouth three times a day    Amlodipine Besylate 10 Mg Tabs (Amlodipine besylate) .[name]... Take 1 tablet by mouth once a day    Lisinopril 40 Mg Tabs (Lisinopril) .[name]... Take 1 tablet by mouth once a day  Problem # 3:  CORONARY ARTERY DISEASE (ICD-414.00) Followed by DrBrodie>  continue same meds. His updated medication list for this problem includes:    Bufferin Low Dose 81 Mg Tbec (Aspirin) .[name]... Take 1 tab by mouth once daily...    Plavix 75 Mg Tabs (Clopidogrel bisulfate) .[name]... Take 1 tab by mouth once daily...    Nitroglycerin 0.4 Mg Subl (Nitroglycerin) ..... One tablet under tongue every 5 minutes as needed for chest pain---may repeat times three    Metoprolol Tartrate 25 Mg Tabs (Metoprolol tartrate) .[name]... Take one tablet by mouth three times a day    Amlodipine Besylate 10 Mg Tabs (Amlodipine besylate) .[name]... Take 1 tablet by mouth once a day    Lisinopril 40 Mg Tabs (Lisinopril) .[name]... Take 1 tablet by mouth once a day  Problem # 4:  HYPERCHOLESTEROLEMIA (ICD-272.0) On Simva80 w/ FLP pending>  needs better diet, get weight down... His updated medication list for this problem includes:    Simvastatin 80 Mg Tabs (Simvastatin) .[name]... Take 1 tablet by mouth once a day  Problem # 5:   DIABETES MELLITUS, BORDERLINE (ICD-790.29) On  diet alone>  needs to get weight down!!!  Problem # 6:  GASTRITIS (ICD-535.50) Continue Ranitadine in light of Plavix rx... His updated medication list for this problem includes:    Ranitidine Hcl 300 Mg Caps (Ranitidine hcl) .[name]... Take 1 tab by mouth once daily for stomach acid...  Problem # 7:  ANXIETY (ICD-300.00) Meds refilled... His updated medication list  for this problem includes:    Clorazepate Dipotassium 7.5 Mg Tabs (Clorazepate dipotassium) .[name]... Take 3 tablet by mouth as needed  Complete Medication List: 1)  Nasonex 50 Mcg/act Susp (Mometasone furoate) .... 2 spray each nostril two times a day 2)  Advair Diskus 250-50 Mcg/dose Misc (Fluticasone-salmeterol) .... I puff two times a day, rinse mouth 3)  Bufferin Low Dose 81 Mg Tbec (Aspirin) .... Take 1 tab by mouth once daily.Marland KitchenMarland Kitchen 4)  Plavix 75 Mg Tabs (Clopidogrel bisulfate) .... Take 1 tab by mouth once daily.Marland KitchenMarland Kitchen 5)  Nitroglycerin 0.4 Mg Subl (Nitroglycerin) .... One tablet under tongue every 5 minutes as needed for chest pain---may repeat times three 6)  Metoprolol Tartrate 25 Mg Tabs (Metoprolol tartrate) .... Take one tablet by mouth three times a day 7)  Amlodipine Besylate 10 Mg Tabs (Amlodipine besylate) .... Take 1 tablet by mouth once a day 8)  Lisinopril 40 Mg Tabs (Lisinopril) .... Take 1 tablet by mouth once a day 9)  Simvastatin 80 Mg Tabs (Simvastatin) .... Take 1 tablet by mouth once a day 10)  Fish Oil 1200 Mg Caps (Omega-3 fatty acids) .... Take 1 tablet by mouth once a day 11)  Flaxseed Oil 1000 Mg Caps (Flaxseed (linseed)) .... Take 1 tablet by mouth once a day 12)  Ranitidine Hcl 300 Mg Caps (Ranitidine hcl) .... Take 1 tab by mouth once daily for stomach acid... 13)  Multivitamins Tabs (Multiple vitamin) .... Take 1 tablet by mouth once a day 14)  Clorazepate Dipotassium 7.5 Mg Tabs (Clorazepate dipotassium) .... Take 3 tablet by mouth as needed 15)  Icaps Caps  (Multiple vitamins-minerals) .... Take one capsule by mouth two times a day  Patient Instructions: 1)  Today we updated your med list- see below....  2)  Continue your current meds the same... 3)  Let's get back on track w/ our diet & exercise program- the goal is to lose 15-20lbs!!! 4)  [name], you need to quit the last of those cigarettes!!! (please quit before they kill you).Marland KitchenMarland Kitchen 5)  Today we did your f/u CXR.Marland KitchenMarland Kitchen  6)  please return to our lab one morning next week for your FASTING blood work... 7)  Then call the "phone tree" in a few days for your lab results... 8)  Call for any problems.Marland KitchenMarland Kitchen 9)  Please schedule a follow-up appointment in 6 months.

## 2010-12-17 NOTE — Assessment & Plan Note (Signed)
Summary: Acute NP office visit - hematuria   Primary Provider/Referring Provider:  Kriste Basque, MD  CC:  bloody urine x1 episode yesterday with extreme pain and and states has had some a lesser degree of bloody urine since with small blood clots 4-5x with no pain.  also some right kidney achiness since onset.[name]  History of Present Illness: 65  with known history of Hyperlipidemia, DM, CAD.  August 20, 2009 --Pt presents to office with c/o coughing up blood since last pm.  Reports he fills his mouth with blood when coughing, chest burning, shortness of breath.  Also reports continuous drip of blood from nose but feels the blood is coming up from his lungs. Was in usual state of health prior to  last evening.      06/2008-- follow up visit and on-going management of mult med problems... 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  Unfortunately he continues to smoke 1/2ppd- denies CP, palpit, or change in SOB/ DOE... no new complaints or concerns...  December 20, 2008--follow up visit. Also c/o nasal congestion, drainage, cough, yellow mucous, ear fullness,. Since last visit doing wel, tolerating meds well. Has gained 15# , "not eating good". Pt is not fasing today. Denies chest pain, dyspnea, orthopnea, hemoptysis, fever, n/v/d, edema, recent travel or antibiotics.  Pt is still smoking.     ~  Dec10:  he's been stable- no renewed epistaxis etc... he saw DrWolicki who rec a sleep study, but pt cancelled this... he tells me he is trying the "e-cigarette" & cut down his regular cigs to 1/4ppd... BP controlled, back on Plavix now and no angina   ~  Mar 26, 2010:  stable 66mo- but still smoking 5-10 cig/d, hasn't lost weight (actually gained 6#), not exercising etc... BP controlled- denies CP,worsening dyspnea, etc... due for CXR & fasting labs...  May 14, 2010--Presents for work in visit today. Had episode of bloody urine x1 episode yesterday with extreme pain, and  states has had some a lesser degree of bloody urine since with small blood clots 4-5x with no pain.  also some right kidney achiness since onset. No discharge, dysuria, abd pain, n/v. Has seen blood in urine today but not as much pain. UA today positive for blood. Denies chest pain, dyspnea, orthopnea, hemoptysis, fever, n/v/d, edema, headache,recent travel or antibiotics. NO prevous episodes.    Medications Prior to Update: 1)  Nasonex 50 Mcg/act  Susp (Mometasone Furoate) .... 2 Spray Each Nostril Two Times A Day 2)  Advair Diskus 250-50 Mcg/dose  Misc (Fluticasone-Salmeterol) .... I Puff Two Times A Day, Rinse Mouth 3)  Bufferin Low Dose 81 Mg Tbec (Aspirin) .... Take 1 Tab By Mouth Once Daily.Marland KitchenMarland Kitchen 4)  Plavix 75 Mg  Tabs (Clopidogrel Bisulfate) .... Take 1 Tab By Mouth Once Daily.Marland KitchenMarland Kitchen 5)  Nitroglycerin 0.4 Mg Subl (Nitroglycerin) .... One Tablet Under Tongue Every 5 Minutes As Needed For Chest Pain---May Repeat Times Three 6)  Metoprolol Tartrate 25 Mg  Tabs (Metoprolol Tartrate) .... Take One Tablet By Mouth Three Times A Day 7)  Amlodipine Besylate 10 Mg Tabs (Amlodipine Besylate) .... Take 1 Tablet By Mouth Once A Day 8)  Lisinopril 40 Mg Tabs (Lisinopril) .... Take 1 Tablet By Mouth Once A Day 9)  Simvastatin 80 Mg  Tabs (Simvastatin) .... Take 1 Tablet By Mouth Once A Day 10)  Fish Oil 1200 Mg  Caps (Omega-3 Fatty Acids) .... Take 1  Tablet By Mouth Once A Day 11)  Flaxseed Oil 1000 Mg  Caps (Flaxseed (Linseed)) .... Take 1 Tablet By Mouth Once A Day 12)  Ranitidine Hcl 300 Mg Caps (Ranitidine Hcl) .... Take 1 Tab By Mouth Once Daily For Stomach Acid... 13)  Multivitamins   Tabs (Multiple Vitamin) .... Take 1 Tablet By Mouth Once A Day 14)  Clorazepate Dipotassium 7.5 Mg  Tabs (Clorazepate Dipotassium) .... Take 3 Tablet By Mouth As Needed 15)  Icaps  Caps (Multiple Vitamins-Minerals) .... Take One Capsule By Mouth Two Times A Day  Current Medications (verified): 1)  Nasonex 50 Mcg/act  Susp  (Mometasone Furoate) .... 2 Spray Each Nostril Two Times A Day 2)  Advair Diskus 250-50 Mcg/dose  Misc (Fluticasone-Salmeterol) .... I Puff Two Times A Day, Rinse Mouth 3)  Bufferin Low Dose 81 Mg Tbec (Aspirin) .... Take 1 Tab By Mouth Once Daily.Marland KitchenMarland Kitchen 4)  Plavix 75 Mg  Tabs (Clopidogrel Bisulfate) .... Take 1 Tab By Mouth Once Daily.Marland KitchenMarland Kitchen 5)  Nitroglycerin 0.4 Mg Subl (Nitroglycerin) .... One Tablet Under Tongue Every 5 Minutes As Needed For Chest Pain---May Repeat Times Three 6)  Metoprolol Tartrate 25 Mg  Tabs (Metoprolol Tartrate) .... Take One Tablet By Mouth Three Times A Day 7)  Amlodipine Besylate 10 Mg Tabs (Amlodipine Besylate) .... Take 1 Tablet By Mouth Once A Day 8)  Lisinopril 40 Mg Tabs (Lisinopril) .... Take 1 Tablet By Mouth Once A Day 9)  Simvastatin 80 Mg  Tabs (Simvastatin) .... Take 1 Tablet By Mouth Once A Day 10)  Fish Oil 1200 Mg  Caps (Omega-3 Fatty Acids) .... Take 1 Tablet By Mouth Once A Day 11)  Flaxseed Oil 1000 Mg  Caps (Flaxseed (Linseed)) .... Take 1 Tablet By Mouth Once A Day 12)  Ranitidine Hcl 300 Mg Caps (Ranitidine Hcl) .... Take 1 Tab By Mouth Once Daily For Stomach Acid... 13)  Multivitamins   Tabs (Multiple Vitamin) .... Take 1 Tablet By Mouth Once A Day 14)  Clorazepate Dipotassium 7.5 Mg  Tabs (Clorazepate Dipotassium) .... Take 3 Tablet By Mouth As Needed 15)  Icaps  Caps (Multiple Vitamins-Minerals) .... Take One Capsule By Mouth Two Times A Day 16)  Tylenol Arthritis Pain 650 Mg Cr-Tabs (Acetaminophen) .... Per Bottle  Allergies (verified): 1)  ! * Mucinex Dm  Past History:  Past Medical History: Last updated: 03/26/2010 Hx of SINUSITIS (ICD-473.9) EPISTAXIS (ICD-784.7) COPD (ICD-496) CIGARETTE SMOKER (ICD-305.1) HYPERTENSION (ICD-401.9) CORONARY ARTERY DISEASE (ICD-414.00) HYPERCHOLESTEROLEMIA (ICD-272.0) DIABETES MELLITUS, BORDERLINE (ICD-790.29) OBESITY (ICD-278.00) GASTRITIS (ICD-535.50) COLONIC POLYPS (ICD-211.3) DEGENERATIVE JOINT  DISEASE (ICD-715.90) ANXIETY (ICD-300.00)  1. Coronary artery disease, status post remote diaphragmatic wall     infarction in 1995 treated with stenting in the right coronary     artery with subsequent overlapping stents placed in the right     coronary artery and recently 3 non-overlapping Cypher stents placed     in the proximal mid and distal right coronary artery in 2008 --total 6 stents 2. Good left ventricular function. 3. Hypertension. 4. Hyperlipidemia. 5. Tobacco use.   Past Surgical History: Last updated: 03/26/2010 S/P Cataract Surg & subseq posterior vitrectomy in Left eye by DrRankin 1996  Family History: Last updated: 2008/06/22 mother deceased age 4  from heart problems father deceased age 41 from cancer 1 sibling alive age 48 1 sibling alive age 28  Social History: Last updated: June 22, 2008 current smoker  1/2 ppd exercises with work drinks caffeine--3-4 cups per day  no etoh married 2  children  Risk Factors: Smoking Status: current (12/18/2007)  Review of Systems      See HPI  Vital Signs:  Patient profile:   65 year old male Height:      67 inches Weight:      242.31 pounds BMI:     38.09 O2 Sat:      94 % on Room air Temp:     99.2 degrees F oral Pulse rate:   94 / minute BP sitting:   120 / 80  (left arm) Cuff size:   regular  Vitals Entered By: Boone Master CNA/MA (May 14, 2010 3:06 PM)  O2 Flow:  Room air CC: bloody urine x1 episode yesterday with extreme pain, and states has had some a lesser degree of bloody urine since with small blood clots 4-5x with no pain.  also some right kidney achiness since onset. Is Patient Diabetic? No Comments Medications reviewed with patient Daytime contact number verified with patient. Boone Master CNA/MA  May 14, 2010 3:06 PM    Physical Exam  Additional Exam:  General: well developed adult HEENT: [place]/AT.  Mucus membranes pink/moist.  No JVD.  No lymphadenopathy.    Neuro: Awake, alert,  oriented.  CV: S1S2 RRR, no murmurs/rubs/gallops.  Radial pulses +2.   Pulm: Resp's shallow, tachypnic.  Lungs bilaterally diminished.  GI: abdomen round/soft, non-tender to palpation.  BSx4 active. No organomegaly., neg CVA tenderness. no guarding or rebound.  Skin: no rashes or lesions Ext: warm/dry.  No edema      Impression & Recommendations:  Problem # 1:  HEMATURIA UNSPECIFIED (ICD-599.70) UA postiive for blood, ?hemorrhagic cystitis. Cx pending. May have renal calculi, none seen on KUB today will  encourage to push fluids, empiric abx for now. close follow up w/ repeat ua.  REC:  Cirpo 500mg  two times a day for 10 days.  Increase fluids, drink plenty of water.  Tylneol as needed pain.  follow up 5 days for repeat UA w/ micro.  Hold Aspirin and plavix until seen back in office.  if this worsens will need to go to ER over holiday weekend.  Please contact office for sooner follow up if symptoms do not improve or worsen   Orders: T-1 View Abdomen (KUB) (74000TC) Est. Patient Level IV (16109)  Medications Added to Medication List This Visit: 1)  Tylenol Arthritis Pain 650 Mg Cr-tabs (Acetaminophen) .... Per bottle 2)  Cipro 500 Mg Tabs (Ciprofloxacin hcl) .[name].. 1 by mouth two times a day  Complete Medication List: 1)  Nasonex 50 Mcg/act Susp (Mometasone furoate) .... 2 spray each nostril two times a day 2)  Advair Diskus 250-50 Mcg/dose Misc (Fluticasone-salmeterol) .... I puff two times a day, rinse mouth 3)  Bufferin Low Dose 81 Mg Tbec (Aspirin) .... Take 1 tab by mouth once daily.Marland KitchenMarland Kitchen 4)  Plavix 75 Mg Tabs (Clopidogrel bisulfate) .... Take 1 tab by mouth once daily.Marland KitchenMarland Kitchen 5)  Nitroglycerin 0.4 Mg Subl (Nitroglycerin) .... One tablet under tongue every 5 minutes as needed for chest pain---may repeat times three 6)  Metoprolol Tartrate 25 Mg Tabs (Metoprolol tartrate) .... Take one tablet by mouth three times a day 7)  Amlodipine Besylate 10 Mg Tabs (Amlodipine besylate) .... Take  1 tablet by mouth once a day 8)  Lisinopril 40 Mg Tabs (Lisinopril) .... Take 1 tablet by mouth once a day 9)  Simvastatin 80 Mg Tabs (Simvastatin) .... Take 1 tablet by mouth once a day 10)  Fish Oil 1200 Mg Caps (Omega-3  fatty acids) .... Take 1 tablet by mouth once a day 11)  Flaxseed Oil 1000 Mg Caps (Flaxseed (linseed)) .... Take 1 tablet by mouth once a day 12)  Ranitidine Hcl 300 Mg Caps (Ranitidine hcl) .... Take 1 tab by mouth once daily for stomach acid... 13)  Multivitamins Tabs (Multiple vitamin) .... Take 1 tablet by mouth once a day 14)  Clorazepate Dipotassium 7.5 Mg Tabs (Clorazepate dipotassium) .... Take 3 tablet by mouth as needed 15)  Icaps Caps (Multiple vitamins-minerals) .... Take one capsule by mouth two times a day 16)  Tylenol Arthritis Pain 650 Mg Cr-tabs (Acetaminophen) .... Per bottle 17)  Cipro 500 Mg Tabs (Ciprofloxacin hcl) .[name].. 1 by mouth two times a day  Patient Instructions: 1)  Cirpo 500mg  two times a day for 10 days.  2)  Increase fluids, drink plenty of water.  3)  Tylneol as needed pain.  4)  follow up 5 days for repeat UA w/ micro.  5)  Hold Aspirin and plavix until seen back in office.  6)  if this worsens will need to go to ER over holiday weekend.  7)  Please contact office for sooner follow up if symptoms do not improve or worsen  Prescriptions: CIPRO 500 MG TABS (CIPROFLOXACIN HCL) 1 by mouth two times a day  #20 x 0   Entered and Authorized by:   Rubye Oaks NP   Signed by:   Rubye Oaks NP on 05/14/2010   Method used:   Electronically to        [facility] (retail)       [address]       [place], [place]  [address]       Ph: [phone]       Fax: [phone]   RxID:   [id number]    Immunization History:  Influenza Immunization History:    Influenza:  historical (08/15/2009)  Pneumovax Immunization History:    Pneumovax:  historical (11/15/1998)

## 2010-12-17 NOTE — Letter (Signed)
Summary: [facility]   Imported By: Lester Silverton 09/28/2010 10:53:04  _____________________________________________________________________  External Attachment:    Type:   Image     Comment:   External Document

## 2010-12-17 NOTE — Consult Note (Signed)
Summary: [facility]   Imported By: Sherian Rein 07/27/2010 09:40:22  _____________________________________________________________________  External Attachment:    Type:   Image     Comment:   External Document

## 2010-12-17 NOTE — Progress Notes (Signed)
Summary: PRESCRIPTS  Phone Note Call from Patient   Caller: Patient Call For: NADEL Summary of Call: PT HAS CHANGED INSURANCE HE WOULD LIKE WRITTEN PRESCRIPT OF HIS MED AND HE WILL PICK UP Initial call taken by: Rickard Patience,  Mar 18, 2010 8:47 AM  Follow-up for Phone Call        pt is trying to avoid [facility] and take some of his rx to [facility] and some to [facility] depending on what they charge. He wants handwritten rx for each med so he can take them to the ccorrect pharmacy. I advised pt that advair and clorazepate we called into [facility] recently with refills. he states that is fine for the clorazepate but he need new rx for advair because he will take that to [facility]. i HAVE PRINTED REQUESTED RXS AND PLACED ON sn LOOK-AT. Carron Curie CMA  Mar 18, 2010 9:31 AM    Additional Follow-up for Phone Call Additional follow up Details #1::        called spoke with patient, advised of ready rx's.  pt to pick up at his convenience. Boone Master CNA  Mar 18, 2010 2:35 PM     Prescriptions: SIMVASTATIN 80 MG  TABS (SIMVASTATIN) Take 1 tablet by mouth once a day  #30 x 4   Entered by:   Carron Curie CMA   Authorized by:   Michele Mcalpine MD   Signed by:   Carron Curie CMA on 03/18/2010   Method used:   Print then Give to Patient   RxID:   [id number] LISINOPRIL 40 MG TABS (LISINOPRIL) Take 1 tablet by mouth once a day  #30 x 4   Entered by:   Carron Curie CMA   Authorized by:   Michele Mcalpine MD   Signed by:   Carron Curie CMA on 03/18/2010   Method used:   Print then Give to Patient   RxID:   [id number] AMLODIPINE BESYLATE 10 MG TABS (AMLODIPINE BESYLATE) Take 1 tablet by mouth once a day  #30 x 4   Entered by:   Carron Curie CMA   Authorized by:   Michele Mcalpine MD   Signed by:   Carron Curie CMA on 03/18/2010   Method used:   Print then Give to Patient   RxID:   [id number] METOPROLOL TARTRATE 25 MG  TABS  (METOPROLOL TARTRATE) take one tablet by mouth three times a day  #90 x 4   Entered by:   Carron Curie CMA   Authorized by:   Michele Mcalpine MD   Signed by:   Carron Curie CMA on 03/18/2010   Method used:   Print then Give to Patient   RxID:   [id number] PLAVIX 75 MG  TABS (CLOPIDOGREL BISULFATE) take 1 tab by mouth once daily...  #30 x 4   Entered by:   Carron Curie CMA   Authorized by:   Michele Mcalpine MD   Signed by:   Carron Curie CMA on 03/18/2010   Method used:   Print then Give to Patient   RxID:   [id number] ADVAIR DISKUS 250-50 MCG/DOSE  MISC (FLUTICASONE-SALMETEROL) i puff two times a day, rinse mouth  #60 Each x 4   Entered by:   Carron Curie CMA   Authorized by:   Michele Mcalpine MD   Signed by:   Carron Curie CMA on 03/18/2010   Method used:   Print then Give to Patient  RxID:   [id number] NASONEX 50 MCG/ACT  SUSP (MOMETASONE FUROATE) 2 spray each nostril two times a day  #17 Gram x 4   Entered by:   Carron Curie CMA   Authorized by:   Michele Mcalpine MD   Signed by:   Carron Curie CMA on 03/18/2010   Method used:   Print then Give to Patient   RxID:   [id number]

## 2010-12-17 NOTE — Assessment & Plan Note (Signed)
Summary: f1y  Medications Added ASPIRIN 81 MG TBEC (ASPIRIN) Take one tablet by mouth daily- on HOLD PLAVIX 75 MG  TABS (CLOPIDOGREL BISULFATE) take 1 tab by mouth once daily- on HOLD METOPROLOL TARTRATE 25 MG  TABS (METOPROLOL TARTRATE) take two tablets in the morning and one tablet in the evening SIMVASTATIN 20 MG TABS (SIMVASTATIN) Take one tablet by mouth daily at bedtime FISH OIL 1200 MG  CAPS (OMEGA-3 FATTY ACIDS) Take 1 tablet by mouth once a day- on HOLD      Allergies Added:   Visit Type:  Pre-op Evaluation Primary Ursula Dermody:  Dr Vernie Ammons  CC:  No cardiac complaints. The pt was diagnosed wed. with bladder cancer. He is pending bladder surgery next week on 9/9 with Dr. Vernie Ammons.[name]  History of Present Illness: The patient is 65 years old and comes in today for preoperative evaluation prior bladder surgery.  he recently was found to have hematuria and workup revealed a bladder cancer and he is scheduled for surgery I believe under general anesthesia a week from Friday.  He has CAD. In 1995 he had a inferior MI treated with a bare-metal stent. In 2008 he had 3 Cypher stent placed to the right coronary artery. He has done well recently with no recent chest pain shortness of breath or palpitations. His other problems include hypertension, hyperlipidemia, and continued cigarette use.  Current Medications (verified): 1)  Nasonex 50 Mcg/act  Susp (Mometasone Furoate) .... 2 Spray Each Nostril Two Times A Day 2)  Advair Diskus 250-50 Mcg/dose  Misc (Fluticasone-Salmeterol) .... I Puff Two Times A Day, Rinse Mouth 3)  Aspirin 81 Mg Tbec (Aspirin) .... Take One Tablet By Mouth Daily- On Hold 4)  Plavix 75 Mg  Tabs (Clopidogrel Bisulfate) .... Take 1 Tab By Mouth Once Daily- On Hold 5)  Nitroglycerin 0.4 Mg Subl (Nitroglycerin) .... One Tablet Under Tongue Every 5 Minutes As Needed For Chest Pain---May Repeat Times Three 6)  Metoprolol Tartrate 25 Mg  Tabs (Metoprolol Tartrate) .... Take Two  Tablets in The Morning and One Tablet in The Evening 7)  Amlodipine Besylate 10 Mg Tabs (Amlodipine Besylate) .... Take 1 Tablet By Mouth Once A Day 8)  Lisinopril 40 Mg Tabs (Lisinopril) .... Take 1 Tablet By Mouth Once A Day 9)  Simvastatin 80 Mg  Tabs (Simvastatin) .... Take 1 Tablet By Mouth Once A Day 10)  Fish Oil 1200 Mg  Caps (Omega-3 Fatty Acids) .... Take 1 Tablet By Mouth Once A Day- On Hold 11)  Flaxseed Oil 1000 Mg  Caps (Flaxseed (Linseed)) .... Take 1 Tablet By Mouth Once A Day 12)  Ranitidine Hcl 300 Mg Caps (Ranitidine Hcl) .... Take 1 Tab By Mouth Once Daily For Stomach Acid... 13)  Multivitamins   Tabs (Multiple Vitamin) .... Take 1 Tablet By Mouth Once A Day 14)  Clorazepate Dipotassium 7.5 Mg  Tabs (Clorazepate Dipotassium) .... Take 3 Tablet By Mouth As Needed 15)  Icaps  Caps (Multiple Vitamins-Minerals) .... Take One Capsule By Mouth Two Times A Day 16)  Tylenol Arthritis Pain 650 Mg Cr-Tabs (Acetaminophen) .... Per Bottle  Allergies (verified): 1)  ! * Mucinex Dm  Past History:  Past Medical History: Reviewed history from 06/30/2010 and no changes required. Hx of SINUSITIS (ICD-473.9) EPISTAXIS (ICD-784.7) COPD (ICD-496) CIGARETTE SMOKER (ICD-305.1) HYPERTENSION (ICD-401.9) CORONARY ARTERY DISEASE (ICD-414.00) HYPERCHOLESTEROLEMIA (ICD-272.0) DIABETES MELLITUS, BORDERLINE (ICD-790.29) OBESITY (ICD-278.00) GASTRITIS (ICD-535.50) COLONIC POLYPS (ICD-211.3) DEGENERATIVE JOINT DISEASE (ICD-715.90) ANXIETY (ICD-300.00)  1. Coronary artery disease, status post  remote diaphragmatic wall     infarction in 1995 treated with stenting in the right coronary     artery with subsequent overlapping stents placed in the right     coronary artery and recently 3 non-overlapping Cypher stents placed     in the proximal mid and distal right coronary artery in 2008 --total 6 stents 2. Good left ventricular function. 3. Hypertension. 4. Hyperlipidemia. 5. Tobacco use.  6.  Hematuria >>refer to urology June 30, 2010 >>>>  Family History: Reviewed history from 06/20/2008 and no changes required. mother deceased age 44  from heart problems father deceased age 75 from cancer 1 sibling alive age 49 1 sibling alive age 71  Social History: Reviewed history from 06/20/2008 and no changes required. current smoker  1/2 ppd exercises with work drinks caffeine--3-4 cups per day  no etoh married 2 children  Review of Systems       ROS is negative except as outlined in HPI.   Vital Signs:  Patient profile:   65 year old male Height:      67 inches Weight:      221.50 pounds Pulse rate:   91 / minute Pulse rhythm:   regular BP sitting:   130 / 70  (left arm)  Vitals Entered By: Sherri Rad, RN, BSN (July 17, 2010 3:12 PM)  Physical Exam  Additional Exam:  Gen. Well-nourished, in no distress   Neck: No JVD, thyroid not enlarged, no carotid bruits Lungs: No tachypnea, clear without rales, rhonchi or wheezes Cardiovascular: Rhythm regular, PMI not displaced,  heart sounds  normal, no murmurs or gallops, no peripheral edema, pulses normal in all 4 extremities. Abdomen: BS normal, abdomen soft and non-tender without masses or organomegaly, no hepatosplenomegaly. MS: No deformities, no cyanosis or clubbing   Neuro:  No focal sns   Skin:  no lesions    Impression & Recommendations:  Problem # 1:  PREOPERATIVE EXAMINATION (ICD-V72.84) He is scheduled for bladder surgery for bladder cancer in about 10 days. His cardiac condition has been stable with no recent chest pain shortness of breath or palpitations and he has never had any problem with congestive heart failure. His left card and shows an old diaphragmatic wall infarction which has not changed. I think his cardiac condition is stable and I think his cardiac risk for undergoing surgery is only slightly increased. I recommend proceeding with surgery as planned.  I would like to resume both the  aspirin and Plavix after surgery.  Problem # 2:  CORONARY ARTERY DISEASE (ICD-414.00) He has CAD as described in the history of present illness. He's had no recent chest pain this problem appears stable. He is currently off aspirin and Plavix and I would recommend resuming both of these drugs as soon as urology feels it is safe after surgery.  He has 3 first generation drug and stents in right coronary so I would recommend continued Plavix use long-term. His updated medication list for this problem includes:    Aspirin 81 Mg Tbec (Aspirin) .[name]... Take one tablet by mouth daily- on hold    Plavix 75 Mg Tabs (Clopidogrel bisulfate) .[name]... Take 1 tab by mouth once daily- on hold    Nitroglycerin 0.4 Mg Subl (Nitroglycerin) ..... One tablet under tongue every 5 minutes as needed for chest pain---may repeat times three    Metoprolol Tartrate 25 Mg Tabs (Metoprolol tartrate) .[name]... Take two tablets in the morning and one tablet in the evening  Amlodipine Besylate 10 Mg Tabs (Amlodipine besylate) .[name]... Take 1 tablet by mouth once a day    Lisinopril 40 Mg Tabs (Lisinopril) .[name]... Take 1 tablet by mouth once a day  Orders: EKG w/ Interpretation (93000)  Problem # 3:  CIGARETTE SMOKER (ICD-305.1) Wwill continue to encourage cessation.  Problem # 4:  HYPERTENSION (ICD-401.9)  This is well controlled on current medications. His updated medication list for this problem includes:    Aspirin 81 Mg Tbec (Aspirin) .[name]... Take one tablet by mouth daily- on hold    Metoprolol Tartrate 25 Mg Tabs (Metoprolol tartrate) .[name]... Take two tablets in the morning and one tablet in the evening    Amlodipine Besylate 10 Mg Tabs (Amlodipine besylate) .[name]... Take 1 tablet by mouth once a day    Lisinopril 40 Mg Tabs (Lisinopril) .[name]... Take 1 tablet by mouth once a day  His updated medication list for this problem includes:    Aspirin 81 Mg Tbec (Aspirin) .[name]... Take one tablet by mouth daily- on hold    Metoprolol Tartrate  25 Mg Tabs (Metoprolol tartrate) .[name]... Take two tablets in the morning and one tablet in the evening    Amlodipine Besylate 10 Mg Tabs (Amlodipine besylate) .[name]... Take 1 tablet by mouth once a day    Lisinopril 40 Mg Tabs (Lisinopril) .[name]... Take 1 tablet by mouth once a day  Patient Instructions: 1)  Decrease simvastatin to 20mg  once daily. 2)  Your physician wants you to follow-up in: 6 months with Dr. Excell Seltzer.  You will receive a reminder letter in the mail two months in advance. If you don't receive a letter, please call our office to schedule the follow-up appointment. 3)  Your physician recommends that you return for FASTING lab work in: 6 weeks- lipid/liver (272.2;414.01).

## 2010-12-17 NOTE — Assessment & Plan Note (Signed)
Summary: 6 months/apc   Primary Care Provider:  Dr Vernie Ammons  CC:  6 month ROV & review of mult medical problems....  History of Present Illness: 65 y/o WM here for a follow up visit and on-going management of mult med problems...    ~  Mar 26, 2010:  stable 35mo- but still smoking 5-10 cig/d, hasn't lost weight (actually gained 6#), not exercising etc... BP controlled- denies CP,worsening dyspnea, etc... due for CXR & fasting labs...   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 he is now sched for 6 BCG instillations... he was seen by DrBBrodie 9/11- preop eval OK for surg (hx CAD, prev IWMI & several PCIs> stable, no angina, & he tol surg well...     Current Problem List:  Hx of SINUSITIS (ICD-473.9) - on NASONEX 2spQhs & recent Rx w/ Augmentin/ Saline... notes occas sinus congestion, drainage, sl HA, etc... he knows that he needs to quit smoking!  ~  10/10: Adm for epistaxis w/ eval by DrWolicki- CXR= NAD, labs OK, rec for saline lavage- nose bleed resolved & disch home (no recurrence so far).  COPD (ICD-496) - STILL SMOKES 5-10 cig/d as above... He's refused Chantix... on ADVAIR 250Bid... episode of hemoptysis 6/99 without lesion on CXR, CTChest, or Bronchoscopy...  ~  Adm 10/10 w/ epistaxsis & eval DrWolicki as noted> he wanted to do Sleep Study but pt cancelled it & declines to resched.  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  HYPERTENSION (ICD-401.9) - controlled on METOPROLOL 25mg Tid,  NORVASC 10mg /d, LISINOPRIL 40mg /d...  BP=122/70, not checking BP's at home... denies HA, fatigue, visual changes, CP, palipit, dizziness, syncope, dyspnea, edema, etc...   CORONARY ARTERY DISEASE  (ICD-414.00) - ASA 81mg /d & PLAVIX 75mg /d as before... 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 Myoview 4/08 abnormal w/ inferior ischemia and subseq cath revealing restenoses in RCA- s/p repeat PTCA/ stents... f/u Myoview 9/08 w/ prev infer infarct, no ischemia, EF=56%...  ~  9/11:  pre op cardiac eval DrBrodie> OK to hold ASA/ Plavix for bladder surg.  HYPERCHOLESTEROLEMIA (Mixed Hyperlipidemia) - prev on Simva80 but DrBrodie decr him to SIMVASTATIN 20mg /d + Fish Oil & Red Wine qd- not really on diet, not exercising & we discussed this again today!  ~  FLP 9/08 on Simva40 showed TChol 151, TG 106, HDL 23, LDL 107  ~  FLP 8/09 on Simva80 showed TChol 142, TG 135, HDL 28, LDL 87  ~  FLP 6/10 on Simva80 showed TChol 151, TG 117, HDL 31, LDL 97  ~  FLP in hosp 10/10 showed TChol 128, TG 110, HDL 26, LDL 80  ~  FLP 5/11 on Simva80 showed TChol 144, TG 215, HDL 26, LDL 89  ~  9/11:  DrBrodie decr him to Simva20... we discussed that he may need ch to Lipitor.  DIABETES MELLITUS, BORDERLINE (ICD-790.29) - on diet alone...  ~  labs 9/08 showed BS=100, HgA1c=6.1  ~  labs 8/09 showed BS= 107, A1c= 6.2  ~  labs 6/10 showed BS= 108, A1c= 6.2  ~  labd 5/11 (wt=244#) showed BS= 121. A1c= 6.7.[name]. he was told "get wt  down, or start meds".  OBESITY (ICD-278.00) - weight was down as low as 202# in 2/09, and steadily incr to 244# by 5/11... we reviewed diet + exercise necessary to lose weight...  ~  weight 5/11 = 244#  ~  weight 11/11 = 217# (after dx & rx for bladder cancer)  GASTRITIS (ICD-535.50) - had EGD 10/05 showing gastritis... on RANITADINE 300mg /d due to his Plavix Rx, off prev Omep.  COLONIC POLYPS (ICD-211.3) - last colonoscopy by DrStark 3/04 showed 7mm polyp = hyperplastic w/ f/u planned 3yrs..  DEGENERATIVE JOINT DISEASE (ICD-715.90) - he has right shoulder pain, also hip & knee pain, Rx ADVIL w/ some benefit... offered Ortho referral & he will decide.  ANXIETY  (ICD-300.00) - on CHLORAZEPATE 7.5mg  Prn...   Preventive Screening-Counseling & Management  Alcohol-Tobacco     Smoking Status: current     Packs/Day: 0.5  Allergies: 1)  ! * Mucinex Dm  Comments:  Nurse/Medical Assistant: The patient's medications and allergies were reviewed with the patient and were updated in the Medication and Allergy Lists.  Past History:  Past Medical History: Hx of SINUSITIS (ICD-473.9) EPISTAXIS (ICD-784.7) COPD (ICD-496) CIGARETTE SMOKER (ICD-305.1) HYPERTENSION (ICD-401.9) CORONARY ARTERY DISEASE (ICD-414.00) HYPERCHOLESTEROLEMIA (ICD-272.0) DIABETES MELLITUS, BORDERLINE (ICD-790.29) OBESITY (ICD-278.00) GASTRITIS (ICD-535.50) COLONIC POLYPS (ICD-211.3) DEGENERATIVE JOINT DISEASE (ICD-715.90) ANXIETY (ICD-300.00)  1. Coronary artery disease, status post remote diaphragmatic wall     infarction in 1995 treated with stenting in the right coronary     artery with subsequent overlapping stents placed in the right     coronary artery and recently 3 non-overlapping Cypher stents placed     in the proximal mid and distal right coronary artery in 2008 --total 6 stents 2. Good left ventricular function. 3. Hypertension. 4. Hyperlipidemia. 5. Tobacco use.  6. Hematuria >>refer to urology June 30, 2010 >>>>dx w/ transitional cell bladder cancer f/by Dr. Vernie Ammons  Past Surgical History: S/P Cataract Surg & subseq posterior vitrectomy in Left eye by DrRankin 1996  Family History: Reviewed history from 06/20/2008 and no changes required. mother deceased age 67  from heart problems father deceased age 60 from cancer 1 sibling alive age 58 1 sibling alive age 67  Social History: Reviewed history from 06/20/2008 and no changes required. current smoker  1/2 ppd exercises with work drinks caffeine--3-4 cups per day  no etoh married 2 children Packs/Day:  0.5  Review of Systems      See HPI       The patient complains of dyspnea on exertion.   The patient denies anorexia, fever, weight loss, weight gain, vision loss, decreased hearing, hoarseness, chest pain, syncope, peripheral edema, prolonged cough, headaches, hemoptysis, abdominal pain, melena, hematochezia, severe indigestion/heartburn, hematuria, incontinence, muscle weakness, suspicious skin lesions, transient blindness, difficulty walking, depression, unusual weight change, abnormal bleeding, enlarged lymph nodes, and angioedema.    Vital Signs:  Patient profile:   65 year old male Height:      67 inches Weight:      217.13 pounds BMI:     34.13 O2 Sat:      99 % on Room air Temp:     98.3 degrees F oral Pulse rate:   68 / minute BP sitting:   122 / 70  (left arm) Cuff size:   large  Vitals Entered By: Randell Loop CMA (September 22, 2010 11:14 AM)  O2 Sat at Rest %:  99 O2 Flow:  Room air CC: 6 month ROV & review of mult medical  problems... Is Patient Diabetic? Yes Pain Assessment Patient in pain? no      Comments no changes in meds today   Physical Exam  Additional Exam:  WD, Obese, 65 y/o WM in NAD... GENERAL:  Alert & oriented; pleasant & cooperative... HEENT:  [place]/AT, EOM-wnl, PERRLA, EACs-clear, TMs-wnl, NOSE- sl congestion, THROAT-clear & wnl. NECK:  Supple w/ fairROM; no JVD; normal carotid impulses w/o bruits; no thyromegaly or nodules palpated; no lymphadenopathy. CHEST:  Clear to P & A; without wheezes/ rales/ or rhonchi heard... HEART:  Regular Rhythm; without murmurs/ rubs/ or gallops detected... ABDOMEN:  Obese, soft, & nontender; normal bowel sounds; no organomegaly or masses detected. EXT: without deformities, mild arthritic changes; no varicose veins/ +venous insuffic/ no edema. NEURO:  CN's intact; no focal neuro deficits... DERM:  No lesions noted; no rash etc...    MISC. Report  Procedure date:  09/22/2010  Findings:      DATA REVIEWED:  ~  Urology eval w/ CT Abd, Cystoscopy, Path reports, etc...  ~  Pre op Cards eval DrBBrodie  9/11...  ~  Labs summary sheets...   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  Problem # 2:  COPD (ICD-496) Still smoking despite everything!!! Discussed smoking cessation options but he won't even discuss this... His updated medication list for this problem includes:    Advair Diskus 250-50 Mcg/dose Misc (Fluticasone-salmeterol) ..... I puff two times a day, rinse mouth  Problem # 3:  HYPERTENSION (ICD-401.9) Controlled on meds> continue same... His updated medication list for this problem includes:    Metoprolol Tartrate 25 Mg Tabs (Metoprolol tartrate) .[name]... Take two tablets in the morning and one tablet in the evening    Amlodipine Besylate 10 Mg Tabs (Amlodipine besylate) .[name]... Take 1 tablet by mouth once a day    Lisinopril 40 Mg Tabs (Lisinopril) .[name]... Take 1 tablet by mouth once a day  Problem # 4:  CORONARY ARTERY DISEASE (ICD-414.00) S/p preop eval from DrBB> stable, no angina, too sedentary, still smoking, etc... continue current meds. His updated medication list for this problem includes:    Aspirin 81 Mg Tbec (Aspirin) .[name]... Take one tablet by mouth daily- on hold    Plavix 75 Mg Tabs (Clopidogrel bisulfate) .[name]... Take 1 tab by mouth once daily- on hold    Nitroglycerin 0.4 Mg Subl (Nitroglycerin) ..... One tablet under tongue every 5 minutes as needed for chest pain---may repeat times three    Metoprolol Tartrate 25 Mg Tabs (Metoprolol tartrate) .[name]... Take two tablets in the morning and one tablet in the evening    Amlodipine Besylate 10 Mg Tabs (Amlodipine besylate) .[name]... Take 1 tablet by mouth once a day    Lisinopril 40 Mg Tabs (Lisinopril) .[name]... Take 1 tablet by mouth once a day  Problem # 5:  HYPERCHOLESTEROLEMIA (ICD-272.0) He has a mixed hyperlipidemia & should be better w/ his wt loss but DrBB decr his Simva to 20mg  & f/u FLP pending  (may need switch to Lipitor)... His updated medication list for this problem includes:    Simvastatin 20 Mg Tabs (Simvastatin) .[name]... Take one tablet by mouth daily at bedtime  Problem # 6:  OBESITY (ICD-278.00) Great job w/ wt reduction...  Problem # 7:  GASTRITIS (ICD-535.50) GI is stable>  continue same Rx... His updated medication list for this problem includes:    Ranitidine Hcl 300 Mg Caps (  Ranitidine hcl) .[name]... Take 1 tab by mouth once daily for stomach acid...  Problem # 8:  DEGENERATIVE JOINT DISEASE (ICD-715.90) Aware> stable on OTC pain Rx... His updated medication list for this problem includes:    Aspirin 81 Mg Tbec (Aspirin) .[name]... Take one tablet by mouth daily- on hold    Tylenol Arthritis Pain 650 Mg Cr-tabs (Acetaminophen) .[name]... Per bottle  Complete Medication List: 1)  Nasonex 50 Mcg/act Susp (Mometasone furoate) .... 2 spray each nostril two times a day 2)  Advair Diskus 250-50 Mcg/dose Misc (Fluticasone-salmeterol) .... I puff two times a day, rinse mouth 3)  Aspirin 81 Mg Tbec (Aspirin) .... Take one tablet by mouth daily- on hold 4)  Plavix 75 Mg Tabs (Clopidogrel bisulfate) .... Take 1 tab by mouth once daily- on hold 5)  Nitroglycerin 0.4 Mg Subl (Nitroglycerin) .... One tablet under tongue every 5 minutes as needed for chest pain---may repeat times three 6)  Metoprolol Tartrate 25 Mg Tabs (Metoprolol tartrate) .... Take two tablets in the morning and one tablet in the evening 7)  Amlodipine Besylate 10 Mg Tabs (Amlodipine besylate) .... Take 1 tablet by mouth once a day 8)  Lisinopril 40 Mg Tabs (Lisinopril) .... Take 1 tablet by mouth once a day 9)  Simvastatin 20 Mg Tabs (Simvastatin) .... Take one tablet by mouth daily at bedtime 10)  Fish Oil 1200 Mg Caps (Omega-3 fatty acids) .... Take 1 tablet by mouth once a day- on hold 11)  Flaxseed Oil 1000 Mg Caps (Flaxseed (linseed)) .... Take 1 tablet by mouth once a day 12)  Ranitidine Hcl 300 Mg Caps (Ranitidine  hcl) .... Take 1 tab by mouth once daily for stomach acid... 13)  Multivitamins Tabs (Multiple vitamin) .... Take 1 tablet by mouth once a day 14)  Alprazolam 0.5 Mg Tabs (Alprazolam) .... Take 1 tab by mouth three times a day as needed for nerves... 15)  Icaps Caps (Multiple vitamins-minerals) .... Take one capsule by mouth two times a day 16)  Tylenol Arthritis Pain 650 Mg Cr-tabs (Acetaminophen) .... Per bottle  Patient Instructions: 1)  Today we updated your med list- see below.... 2)  We refilled your perscriptions as requested & changed the Chlorazepate to ALPRAZOLAM..[name] 3)  Keep up the good work w/ weight reduction... and try to discontinue the last of those cigarettes.Marland KitchenMarland Kitchen 4)  Call for any problems.Marland KitchenMarland Kitchen 5)  Please schedule a follow-up appointment in 6 months, with FASTING blood work at that visit... Prescriptions: ALPRAZOLAM 0.5 MG TABS (ALPRAZOLAM) take 1 tab by mouth three times a day as needed for nerves...  #90 x 6   Entered and Authorized by:   Michele Mcalpine MD   Signed by:   Michele Mcalpine MD on 09/22/2010   Method used:   Print then Give to Patient   RxID:   [id number] RANITIDINE HCL 300 MG CAPS (RANITIDINE HCL) take 1 tab by mouth once daily for stomach acid...  #30 x 12   Entered and Authorized by:   Michele Mcalpine MD   Signed by:   Michele Mcalpine MD on 09/22/2010   Method used:   Print then Give to Patient   RxID:   [id number] ADVAIR DISKUS 250-50 MCG/DOSE  MISC (FLUTICASONE-SALMETEROL) i puff two times a day, rinse mouth  #1 x 12   Entered and Authorized by:   Michele Mcalpine MD   Signed by:   Michele Mcalpine MD on 09/22/2010   Method used:  Print then Give to Patient   RxID:   [id number] NASONEX 50 MCG/ACT  SUSP (MOMETASONE FUROATE) 2 spray each nostril two times a day  #1 x 12   Entered and Authorized by:   Michele Mcalpine MD   Signed by:   Michele Mcalpine MD on 09/22/2010   Method used:   Print then Give to Patient   RxID:    [id number]    Immunization History:  Influenza Immunization History:    Influenza:  historical (08/03/2010)

## 2010-12-17 NOTE — Progress Notes (Signed)
Summary: Records Request   Faxed OV & EKG to [facility] at [facility] ([phone]). Debby Freiberg  September 07, 2010 12:35 PM

## 2010-12-17 NOTE — Letter (Signed)
Summary: [facility]   Imported By: Sherian Rein 07/27/2010 09:43:14  _____________________________________________________________________  External Attachment:    Type:   Image     Comment:   External Document

## 2010-12-23 NOTE — Letter (Signed)
Summary: Ihor Gully MD/[facility]  Ihor Gully MD/[facility]   Imported By: Lester [place] 12/17/2010 09:04:43  _____________________________________________________________________  External Attachment:    Type:   Image     Comment:   External Document

## 2011-01-05 ENCOUNTER — Ambulatory Visit (INDEPENDENT_AMBULATORY_CARE_PROVIDER_SITE_OTHER): Payer: Medicare Other | Admitting: Cardiovascular Disease

## 2011-01-05 ENCOUNTER — Encounter: Payer: Self-pay | Admitting: Cardiovascular Disease

## 2011-01-05 DIAGNOSIS — I2510 Atherosclerotic heart disease of native coronary artery without angina pectoris: Secondary | ICD-10-CM

## 2011-01-12 NOTE — Assessment & Plan Note (Signed)
Summary: f/u   Medications Added ASPIRIN 81 MG TBEC (ASPIRIN) Take one tablet by mouth daily PLAVIX 75 MG  TABS (CLOPIDOGREL BISULFATE) take 1 tab by mouth once daily FISH OIL 1200 MG  CAPS (OMEGA-3 FATTY ACIDS) Take 1 tablet by mouth once a day XANAX 0.5 MG TABS (ALPRAZOLAM) as needed        Visit Type:  Follow-up Primary Provider:  Dr Kriste Basque  CC:  No complaints.  History of Present Illness: 65 year-old male with hx of CAD and inferior MI back in 1995. He has undergone multiple PCI procedures, most-recently treated with overlapping DES in the right coronary artery in 2009. He has undergone 3 separate bladder surgeries since his last visit with Dr Juanda Chance and has had no cardiac problems since then. He has been on and off of plavix around his surgeries without problems.  The patient is doing well at present. He is getting back to his normal activities following surgery. The patient denies chest pain, dyspnea, orthopnea, PND, edema, palpitations, lightheadedness, or syncope. He does have generalized fatigue.   Current Medications (verified): 1)  Nasonex 50 Mcg/act  Susp (Mometasone Furoate) .... 2 Spray Each Nostril Two Times A Day 2)  Advair Diskus 250-50 Mcg/dose  Misc (Fluticasone-Salmeterol) .... I Puff Two Times A Day, Rinse Mouth 3)  Aspirin 81 Mg Tbec (Aspirin) .... Take One Tablet By Mouth Daily 4)  Plavix 75 Mg  Tabs (Clopidogrel Bisulfate) .... Take 1 Tab By Mouth Once Daily 5)  Nitroglycerin 0.4 Mg Subl (Nitroglycerin) .... One Tablet Under Tongue Every 5 Minutes As Needed For Chest Pain---May Repeat Times Three 6)  Metoprolol Tartrate 25 Mg  Tabs (Metoprolol Tartrate) .... Take Two Tablets in The Morning and One Tablet in The Evening 7)  Amlodipine Besylate 10 Mg Tabs (Amlodipine Besylate) .... Take 1 Tablet By Mouth Once A Day 8)  Lisinopril 40 Mg Tabs (Lisinopril) .... Take 1 Tablet By Mouth Once A Day 9)  Simvastatin 20 Mg Tabs (Simvastatin) .... Take One Tablet By Mouth  Daily At Bedtime 10)  Fish Oil 1200 Mg  Caps (Omega-3 Fatty Acids) .... Take 1 Tablet By Mouth Once A Day 11)  Flaxseed Oil 1000 Mg  Caps (Flaxseed (Linseed)) .... Take 1 Tablet By Mouth Once A Day 12)  Ranitidine Hcl 300 Mg Caps (Ranitidine Hcl) .... Take 1 Tab By Mouth Once Daily For Stomach Acid... 13)  Multivitamins   Tabs (Multiple Vitamin) .... Take 1 Tablet By Mouth Once A Day 14)  Xanax 0.5 Mg Tabs (Alprazolam) .... As Needed 15)  Icaps  Caps (Multiple Vitamins-Minerals) .... Take One Capsule By Mouth Two Times A Day 16)  Tylenol Arthritis Pain 650 Mg Cr-Tabs (Acetaminophen) .... Per Bottle  Allergies: 1)  ! * Mucinex Dm  Past History:  Past medical history reviewed for relevance to current acute and chronic problems.  Past Medical History: Hx of SINUSITIS (ICD-473.9) EPISTAXIS (ICD-784.7) COPD (ICD-496) CIGARETTE SMOKER (ICD-305.1) HYPERTENSION (ICD-401.9) CORONARY ARTERY DISEASE (ICD-414.00), hx MI 1995. 3 DES in the RCA in 2009. HYPERCHOLESTEROLEMIA (ICD-272.0) DIABETES MELLITUS, BORDERLINE (ICD-790.29) OBESITY (ICD-278.00) GASTRITIS (ICD-535.50) COLONIC POLYPS (ICD-211.3) DEGENERATIVE JOINT DISEASE (ICD-715.90) ANXIETY (ICD-300.00)  1. Coronary artery disease, status post remote diaphragmatic wall     infarction in 1995 treated with stenting in the right coronary     artery with subsequent overlapping stents placed in the right     coronary artery and recently 3 non-overlapping Cypher stents placed     in the proximal mid and  distal right coronary artery in 2008 --total 6 stents 2. Good left ventricular function. 3. Hypertension. 4. Hyperlipidemia. 5. Tobacco use.  6. Hematuria >>refer to urology June 30, 2010 >>>>dx w/ transitional cell bladder cancer f/by Dr. Vernie Ammons  Review of Systems       Negative except as per HPI   Vital Signs:  Patient profile:   65 year old male Height:      67 inches Weight:      212 pounds BMI:     33.32 Pulse rate:   79 /  minute Pulse rhythm:   regular Resp:     18 per minute BP sitting:   138 / 84  (left arm) Cuff size:   large  Vitals Entered By: Vikki Ports (January 05, 2011 1:45 PM)  Physical Exam  General:  Pt is alert and oriented, overweight male in no acute distress. HEENT: normal Neck: normal carotid upstrokes without bruits, JVP normal Lungs: CTA CV: RRR without murmur or gallop Abd: soft, obese, NT, positive BS Ext: no clubbing, cyanosis, or edema. peripheral pulses 2+ and equal Skin: warm and dry without rash    EKG  Procedure date:  01/05/2011  Findings:      NSR 79 bpm, within normal limits.  Impression & Recommendations:  Problem # 1:  CORONARY ARTERY DISEASE (ICD-414.00) The patient is stable without angina. He has undergone 3 surgeries with interruption of dual antiplatelet Rx and hasn't had ischemic problems. Recommend followup Myoview stress testing in one year. Surveillance stress testing in this patient is reasonable as he has multiple stents and past history of silent ischemia.  His updated medication list for this problem includes:    Aspirin 81 Mg Tbec (Aspirin) .[name]... Take one tablet by mouth daily    Plavix 75 Mg Tabs (Clopidogrel bisulfate) .[name]... Take 1 tab by mouth once daily    Nitroglycerin 0.4 Mg Subl (Nitroglycerin) ..... One tablet under tongue every 5 minutes as needed for chest pain---may repeat times three    Metoprolol Tartrate 25 Mg Tabs (Metoprolol tartrate) .[name]... Take two tablets in the morning and one tablet in the evening    Amlodipine Besylate 10 Mg Tabs (Amlodipine besylate) .[name]... Take 1 tablet by mouth once a day    Lisinopril 40 Mg Tabs (Lisinopril) .[name]... Take 1 tablet by mouth once a day  Problem # 2:  HYPERTENSION (ICD-401.9) Controlled.  His updated medication list for this problem includes:    Aspirin 81 Mg Tbec (Aspirin) .[name]... Take one tablet by mouth daily    Metoprolol Tartrate 25 Mg Tabs (Metoprolol tartrate) .[name]... Take two tablets  in the morning and one tablet in the evening    Amlodipine Besylate 10 Mg Tabs (Amlodipine besylate) .[name]... Take 1 tablet by mouth once a day    Lisinopril 40 Mg Tabs (Lisinopril) .[name]... Take 1 tablet by mouth once a day  BP today: 138/84 Prior BP: 122/70 (09/22/2010)  Labs Reviewed: K+: 4.9 (03/30/2010) Creat: : 0.9 (03/30/2010)   Chol: 144 (03/30/2010)   HDL: 26.00 (03/30/2010)   LDL: 97 (04/21/2009)   TG: 215.0 (03/30/2010)  Problem # 3:  HYPERCHOLESTEROLEMIA (ICD-272.0) Low HDL noted. Medication cost is a major issue. Continue low-dose simvastatin.  His updated medication list for this problem includes:    Simvastatin 20 Mg Tabs (Simvastatin) .[name]... Take one tablet by mouth daily at bedtime  CHOL: 144 (03/30/2010)   LDL: 97 (04/21/2009)   HDL: 26.00 (03/30/2010)   TG: 215.0 (03/30/2010)  Patient Instructions: 1)  Your physician recommends that you schedule a follow-up appointment in: 1 year with Dr. Excell Seltzer 2)  Your physician recommends that you continue on your current medications as directed. Please refer to the Current Medication list given to you today. 3)  Your physician has requested that you have an lexiscan myoview prior to your appointment with Dr. Excell Seltzer. For further information please visit [email or web address]  Please follow instruction sheet, as given. Please call us for instructions when you schedule your test.

## 2011-01-27 LAB — POCT I-STAT 4, (NA,K, GLUC, HGB,HCT)
Glucose, Bld: 126 mg/dL — ABNORMAL HIGH (ref 70–99)
HCT: 33 % — ABNORMAL LOW (ref 39.0–52.0)
Hemoglobin: 11.2 g/dL — ABNORMAL LOW (ref 13.0–17.0)
Potassium: 4.4 mEq/L (ref 3.5–5.1)
Sodium: 142 mEq/L (ref 135–145)

## 2011-01-27 LAB — GLUCOSE, CAPILLARY: Glucose-Capillary: 148 mg/dL — ABNORMAL HIGH (ref 70–99)

## 2011-01-28 LAB — POCT I-STAT 4, (NA,K, GLUC, HGB,HCT)
Glucose, Bld: 121 mg/dL — ABNORMAL HIGH (ref 70–99)
HCT: 39 % (ref 39.0–52.0)
Hemoglobin: 13.3 g/dL (ref 13.0–17.0)
Potassium: 3.8 mEq/L (ref 3.5–5.1)
Sodium: 142 mEq/L (ref 135–145)

## 2011-01-28 LAB — URINE CULTURE
Colony Count: >=100000
Culture  Setup Time: 201109091025

## 2011-01-28 LAB — GLUCOSE, CAPILLARY: Glucose-Capillary: 114 mg/dL — ABNORMAL HIGH (ref 70–99)

## 2011-02-18 LAB — COMPREHENSIVE METABOLIC PANEL
ALT: 27 U/L (ref 0–53)
AST: 28 U/L (ref 0–37)
Albumin: 3.6 g/dL (ref 3.5–5.2)
Alkaline Phosphatase: 35 U/L — ABNORMAL LOW (ref 39–117)
BUN: 12 mg/dL (ref 6–23)
CO2: 25 mEq/L (ref 19–32)
Calcium: 8.6 mg/dL (ref 8.4–10.5)
Chloride: 105 mEq/L (ref 96–112)
Creatinine, Ser: 0.83 mg/dL (ref 0.4–1.5)
GFR calc Af Amer: >60 mL/min (ref >60)
GFR calc non Af Amer: >60 mL/min (ref >60)
Glucose, Bld: 130 mg/dL — ABNORMAL HIGH (ref 70–99)
Potassium: 3.6 mEq/L (ref 3.5–5.1)
Sodium: 138 mEq/L (ref 135–145)
Total Bilirubin: 0.9 mg/dL (ref 0.3–1.2)
Total Protein: 6.6 g/dL (ref 6.0–8.3)

## 2011-02-18 LAB — CBC
HCT: 36.3 % — ABNORMAL LOW (ref 39.0–52.0)
HCT: 37.7 % — ABNORMAL LOW (ref 39.0–52.0)
Hemoglobin: 12.7 g/dL — ABNORMAL LOW (ref 13.0–17.0)
Hemoglobin: 13.2 g/dL (ref 13.0–17.0)
MCHC: 34.9 g/dL (ref 30.0–36.0)
MCHC: 35 g/dL (ref 30.0–36.0)
MCV: 100.2 fL — ABNORMAL HIGH (ref 78.0–100.0)
MCV: 99 fL (ref 78.0–100.0)
Platelets: 192 10*3/uL (ref 150–400)
Platelets: 196 10*3/uL (ref 150–400)
RBC: 3.63 MIL/uL — ABNORMAL LOW (ref 4.22–5.81)
RBC: 3.81 MIL/uL — ABNORMAL LOW (ref 4.22–5.81)
RDW: 13.3 % (ref 11.5–15.5)
RDW: 13.6 % (ref 11.5–15.5)
WBC: 7 10*3/uL (ref 4.0–10.5)
WBC: 7.3 10*3/uL (ref 4.0–10.5)

## 2011-02-18 LAB — BASIC METABOLIC PANEL
BUN: 11 mg/dL (ref 6–23)
CO2: 27 mEq/L (ref 19–32)
Calcium: 9.3 mg/dL (ref 8.4–10.5)
Chloride: 106 mEq/L (ref 96–112)
Creatinine, Ser: 0.83 mg/dL (ref 0.4–1.5)
GFR calc Af Amer: >60 mL/min (ref >60)
GFR calc non Af Amer: >60 mL/min (ref >60)
Glucose, Bld: 122 mg/dL — ABNORMAL HIGH (ref 70–99)
Potassium: 3.7 mEq/L (ref 3.5–5.1)
Sodium: 140 mEq/L (ref 135–145)

## 2011-02-18 LAB — HEMOGLOBIN A1C
Hgb A1c MFr Bld: 6.4 % — ABNORMAL HIGH (ref 4.6–6.1)
Mean Plasma Glucose: 137 mg/dL

## 2011-02-18 LAB — LIPID PANEL
LDL Cholesterol: 80 mg/dL (ref 0–99)
VLDL: 22 mg/dL (ref 0–40)

## 2011-03-24 ENCOUNTER — Ambulatory Visit (INDEPENDENT_AMBULATORY_CARE_PROVIDER_SITE_OTHER): Payer: Medicare Other | Admitting: Pulmonary Disease

## 2011-03-24 ENCOUNTER — Encounter: Payer: Self-pay | Admitting: Pulmonary Disease

## 2011-03-24 ENCOUNTER — Other Ambulatory Visit (INDEPENDENT_AMBULATORY_CARE_PROVIDER_SITE_OTHER): Payer: Medicare Other

## 2011-03-24 ENCOUNTER — Ambulatory Visit (INDEPENDENT_AMBULATORY_CARE_PROVIDER_SITE_OTHER)
Admission: RE | Admit: 2011-03-24 | Discharge: 2011-03-24 | Disposition: A | Discharge status: Home / Self Care | Payer: Medicare Other | Source: Ambulatory Visit | Attending: Pulmonary Disease | Admitting: Pulmonary Disease

## 2011-03-24 DIAGNOSIS — C679 Malignant neoplasm of bladder, unspecified: Secondary | ICD-10-CM

## 2011-03-24 DIAGNOSIS — F419 Anxiety disorder, unspecified: Secondary | ICD-10-CM

## 2011-03-24 DIAGNOSIS — E7800 Pure hypercholesterolemia, unspecified: Secondary | ICD-10-CM

## 2011-03-24 DIAGNOSIS — M545 Low back pain, unspecified: Secondary | ICD-10-CM

## 2011-03-24 DIAGNOSIS — K2990 Gastroduodenitis, unspecified, without bleeding: Secondary | ICD-10-CM

## 2011-03-24 DIAGNOSIS — F411 Generalized anxiety disorder: Secondary | ICD-10-CM

## 2011-03-24 DIAGNOSIS — I2510 Atherosclerotic heart disease of native coronary artery without angina pectoris: Secondary | ICD-10-CM

## 2011-03-24 DIAGNOSIS — R7309 Other abnormal glucose: Secondary | ICD-10-CM

## 2011-03-24 DIAGNOSIS — J449 Chronic obstructive pulmonary disease, unspecified: Secondary | ICD-10-CM

## 2011-03-24 DIAGNOSIS — I10 Essential (primary) hypertension: Secondary | ICD-10-CM

## 2011-03-24 DIAGNOSIS — J4489 Other specified chronic obstructive pulmonary disease: Secondary | ICD-10-CM

## 2011-03-24 DIAGNOSIS — K2970 Gastritis, unspecified, without bleeding: Secondary | ICD-10-CM

## 2011-03-24 DIAGNOSIS — M1990 Unspecified osteoarthritis, unspecified site: Secondary | ICD-10-CM

## 2011-03-24 DIAGNOSIS — E669 Obesity, unspecified: Secondary | ICD-10-CM

## 2011-03-24 DIAGNOSIS — N139 Obstructive and reflux uropathy, unspecified: Secondary | ICD-10-CM

## 2011-03-24 LAB — CBC WITH DIFFERENTIAL/PLATELET
Basophils Absolute: 0 10*3/uL (ref 0.0–0.1)
Eosinophils Relative: 2.3 % (ref 0.0–5.0)
HCT: 42.8 % (ref 39.0–52.0)
Hemoglobin: 15.1 g/dL (ref 13.0–17.0)
Lymphocytes Relative: 22.5 % (ref 12.0–46.0)
Lymphs Abs: 1.5 10*3/uL (ref 0.7–4.0)
Monocytes Relative: 8 % (ref 3.0–12.0)
Neutro Abs: 4.4 10*3/uL (ref 1.4–7.7)
Platelets: 211 10*3/uL (ref 150.0–400.0)
WBC: 6.6 10*3/uL (ref 4.5–10.5)

## 2011-03-24 LAB — BASIC METABOLIC PANEL
BUN: 11 mg/dL (ref 6–23)
CO2: 28 mEq/L (ref 19–32)
Calcium: 9.3 mg/dL (ref 8.4–10.5)
GFR: 101.35 mL/min (ref >60.00)
Glucose, Bld: 109 mg/dL — ABNORMAL HIGH (ref 70–99)

## 2011-03-24 LAB — HEPATIC FUNCTION PANEL
Albumin: 4.1 g/dL (ref 3.5–5.2)
Total Protein: 7.1 g/dL (ref 6.0–8.3)

## 2011-03-24 LAB — PSA: PSA: 0.53 ng/mL (ref 0.10–4.00)

## 2011-03-24 LAB — LIPID PANEL
Cholesterol: 162 mg/dL (ref 0–200)
HDL: 31.9 mg/dL — ABNORMAL LOW (ref >39.00)
Triglycerides: 159 mg/dL — ABNORMAL HIGH (ref 0.0–149.0)
VLDL: 31.8 mg/dL (ref 0.0–40.0)

## 2011-03-24 LAB — TSH: TSH: 0.98 u[IU]/mL (ref 0.35–5.50)

## 2011-03-24 MED ORDER — MOMETASONE FUROATE 50 MCG/ACT NA SUSP: 2.0000 | Freq: Two times a day (BID) | NASAL | Status: DC

## 2011-03-24 MED ORDER — SIMVASTATIN 20 MG PO TABS: 20.0000 mg | ORAL_TABLET | Freq: Every day | ORAL | Status: DC

## 2011-03-24 MED ORDER — AMLODIPINE BESYLATE 10 MG PO TABS: 10.0000 mg | ORAL_TABLET | Freq: Every day | ORAL | Status: DC

## 2011-03-24 MED ORDER — METOPROLOL TARTRATE 25 MG PO TABS: ORAL_TABLET | ORAL | Status: DC

## 2011-03-24 MED ORDER — RANITIDINE HCL 300 MG PO CAPS: 300.0000 mg | ORAL_CAPSULE | Freq: Every evening | ORAL | Status: DC

## 2011-03-24 MED ORDER — FLUTICASONE-SALMETEROL 250-50 MCG/DOSE IN AEPB: 1.0000 | INHALATION_SPRAY | Freq: Two times a day (BID) | RESPIRATORY_TRACT | Status: DC

## 2011-03-24 MED ORDER — ALPRAZOLAM 0.5 MG PO TABS: 0.5000 mg | ORAL_TABLET | Freq: Three times a day (TID) | ORAL | Status: DC | PRN

## 2011-03-24 MED ORDER — CHLORZOXAZONE 250 MG PO TABS: 250.0000 mg | ORAL_TABLET | Freq: Four times a day (QID) | ORAL | Status: DC | PRN

## 2011-03-24 MED ORDER — CLOPIDOGREL BISULFATE 75 MG PO TABS: 75.0000 mg | ORAL_TABLET | Freq: Every day | ORAL | Status: DC

## 2011-03-24 MED ORDER — LISINOPRIL 40 MG PO TABS: 40.0000 mg | ORAL_TABLET | Freq: Every day | ORAL | Status: DC

## 2011-03-24 NOTE — Progress Notes (Signed)
Subjective:    Patient ID: [name], male    DOB: 1946-08-17, 65 y.o.   MRN: [id number]  HPI 65 y/o WM here for a follow up visit and on-going management of mult med problems...   ~  Mar 26, 2010:  stable 7mo- but still smoking 5-10 cig/d, hasn't lost weight (actually gained 6#), not exercising etc... BP controlled- denies CP,worsening dyspnea, etc... due for CXR & fasting labs...  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 he is now sched for 6 BCG instillations... he was seen by DrBBrodie 9/11- preop eval OK for surg (hx CAD, prev IWMI & several PCIs> stable, no angina, & he tol surg well...   ~  Mat 9, 2012:  7mo ROV & doing well he says> in the interim he has had f/u w/ [name] & [name]; smoking "less"; BP controlled; weight down nicely w/ diet & exercise;  Due for f/u CXR & fasting labs> see below:        Problem List:  Hx of SINUSITIS (ICD-473.9) - on NASONEX 2spQhs & recent Rx w/ Augmentin/ Saline... notes occas sinus congestion, drainage, sl HA, etc... he knows that he needs to quit smoking! ~  10/10: Adm for epistaxis w/ eval by DrWolicki- CXR= NAD, labs OK, rec for saline lavage- nose bleed resolved & disch home (no recurrence so far).  COPD (ICD-496) - on ADVAIR 250Bid; STILL SMOKES 5-10 cig/d & he's refused Chantix...  ~  He had hemoptysis 6/99 without lesion on CXR, CTChest, or Bronchoscopy... ~  Adm 10/10 w/ epistaxsis & eval DrWolicki as noted> he wanted to do Sleep Study but pt cancelled it & declined to resched. 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 ~  CXR 5/12 showed ectatic calcif & tort Aorta, COPD w/ incr markings ar bases, NAD, +degen  spondylosis.  HYPERTENSION (ICD-401.9) - controlled on METOPROLOL 25mg Tid,  NORVASC 10mg /d, LISINOPRIL 40mg /d...   ~  11/11:  BP=122/70, not checking BP's at home... denies HA, fatigue, visual changes, CP, palipit, dizziness, syncope, dyspnea, edema, etc. ~  5/12:  BP= 120/72, pt very pleased w/ his wt loss & improved exercise ability.  CORONARY ARTERY DISEASE (ICD-414.00) - ASA 81mg /d & PLAVIX 75mg /d...   ~  Hx prev IWMI and PTCA/ stent in RCA by DrBrodie 12/95 & 5/96...  ~  Myoview 4/08 abnormal w/ inferior ischemia and subseq cath revealing restenoses in RCA- s/p repeat PTCA/ stents...  ~  f/u Myoview 9/08 w/ prev infer infarct, no ischemia, EF=56%... ~  9/11:  pre op cardiac eval DrBrodie> OK to hold ASA/ Plavix for bladder surg. 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  HYPERCHOLESTEROLEMIA (Mixed Hyperlipidemia) - prev on Simva80 but DrBrodie decr him to SIMVASTATIN 20mg /d + Fish Oil & Red Wine qd... ~  FLP 9/08 on Simva40 showed TChol 151, TG 106, HDL 23, LDL 107 ~  FLP 8/09 on Simva80 showed TChol 142, TG 135, HDL 28, LDL 87 ~  FLP 6/10 on Simva80 showed TChol 151, TG 117, HDL 31, LDL 97 ~  FLP in hosp 10/10  showed TChol 128, TG 110, HDL 26, LDL 80 ~  FLP 5/11 on Simva80 showed TChol 144, TG 215, HDL 26, LDL 89 ~  9/11:  DrBrodie decr him to Simva20... we discussed the need for better diet & wt reduction. ~  FLP 5/12 on Simva20 showed TChol 162, TG 159, HDL 32, LDL 98  DIABETES MELLITUS, BORDERLINE (ICD-790.29) - on diet alone... ~  labs 9/08 showed BS=100, HgA1c=6.1 ~  labs 8/09 showed BS= 107, A1c= 6.2 ~  labs 6/10 showed BS= 108, A1c= 6.2 ~  lab 5/11 (wt=244#) showed BS= 121. A1c= 6.7.[name]. he was told "get wt down, or start meds". ~  Labs 5/12 showed BS= 109, A1c= 6.1  OBESITY (ICD-278.00) - weight was down as low as 202# in 2/09, and steadily incr to 244# by 5/11... ~  weight 5/11 = 244#.[name].  we  reviewed diet + exercise necessary to lose weight... ~  weight 11/11 = 217# (after dx & rx for bladder cancer) ~  Weight 5/12 = 214# but says it's 192# at home...  GASTRITIS (ICD-535.50) - had EGD 10/05 showing gastritis... on RANITADINE 300mg /d due to his Plavix Rx, off prev Omep.  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 ~  4/12:  Pt reportsd f/u cysto was neg, doing well...  DEGENERATIVE JOINT DISEASE (ICD-715.90) - He has c/o right shoulder pain, hip pain, knee pain, and LBP> Rx ADVIL w/ some benefit... offered Ortho referral & he will decide.  ANXIETY (ICD-300.00) - on CHLORAZEPATE 7.5mg  Prn...   Past Surgical History  Procedure Date  . Cataract surgery/sub posterior vitrectomy in left eye 1996    Dr. Luciana Axe    Outpatient Encounter Prescriptions as of 03/24/2011  Medication Sig Dispense Refill  . Acetaminophen (TYLENOL ARTHRITIS PAIN PO) Per bottle instructions       . ALPRAZolam (XANAX) 0.5 MG tablet Take 0.5 mg by mouth 3 (three) times daily as needed.        [name] amLODipine (NORVASC) 10 MG tablet Take 10 mg by mouth daily.        [name] aspirin 81 MG tablet Take 81 mg by mouth daily.        . clopidogrel (PLAVIX) 75 MG tablet Take 75 mg by mouth daily.        . Flaxseed, Linseed, (FLAXSEED OIL) 1000 MG CAPS Take 1 capsule by mouth daily.        . Fluticasone-Salmeterol (ADVAIR DISKUS) 250-50 MCG/DOSE AEPB Inhale 1 puff into the lungs every 12 (twelve) hours.        [name] lisinopril (PRINIVIL,ZESTRIL) 40 MG tablet Take 40 mg by mouth daily.        . metoprolol tartrate (LOPRESSOR) 25 MG tablet Take 2 tablets by mouth in the  morning and 1 tablet by mouth at night       . mometasone (NASONEX) 50 MCG/ACT nasal spray 2 sprays by Nasal route 2 (two) times daily.        . Multiple Vitamin (MULTIVITAMIN) capsule Take 1 capsule by mouth daily.        . Multiple Vitamins-Minerals (OCUVITE ADULT 50+ PO) Take  1 tablet by mouth 2 (two) times daily.        . nitroGLYCERIN (NITROSTAT) 0.4 MG SL tablet Place 0.4 mg under the tongue every 5 (five) minutes as needed.        . Omega-3 Fatty Acids (FISH OIL) 1200 MG CAPS Take 1 capsule by mouth daily.        . ranitidine (ZANTAC) 300 MG capsule Take 300 mg by mouth every evening.        . simvastatin (ZOCOR) 20 MG tablet Take 20 mg by mouth at bedtime.          Allergies  Allergen Reactions  . Dextromethorphan-Guaifenesin     REACTION: GI upset    Review of Systems        See HPI - all other systems neg except as noted... The patient complains of some dyspnea on exertion.  The patient denies anorexia, fever, weight loss, weight gain, vision loss, decreased hearing, hoarseness, chest pain, syncope, peripheral edema, prolonged cough, headaches, hemoptysis, abdominal pain, melena, hematochezia, severe indigestion/heartburn, hematuria, incontinence, muscle weakness, suspicious skin lesions, transient blindness, difficulty walking, depression, unusual weight change, abnormal bleeding, enlarged lymph nodes, and angioedema.     Objective:   Physical Exam      WD, Obese, 65 y/o WM in NAD... Vital Signs:  Reviewed... GENERAL:  Alert & oriented; pleasant & cooperative... HEENT:  [place]/AT, EOM-wnl, PERRLA, EACs-clear, TMs-wnl, NOSE- sl congestion, THROAT-clear & wnl. NECK:  Supple w/ fairROM; no JVD; normal carotid impulses w/o bruits; no thyromegaly or nodules palpated; no lymphadenopathy. CHEST:  Clear to P & A; without wheezes/ rales/ or rhonchi heard... HEART:  Regular Rhythm; without murmurs/ rubs/ or gallops detected... ABDOMEN:  Obese, soft, & nontender; normal bowel sounds; no  organomegaly or masses detected. EXT: without deformities, mild arthritic changes; no varicose veins/ +venous insuffic/ no edema. NEURO:  CN's intact; no focal neuro deficits... DERM:  No lesions noted; no rash etc...   Assessment & Plan:   COPD>  He is still smoking & understands the importance of smoking cessation; continue Advair, Mucinex prn, wt reduction/ exercise/ etc...  HBP>  Controlled on meds + diet & exercise program...  CAD>  On ASA/ Plavix & stable w/o angina, etc...  LIPIDS>  Improved on diet/ wt reduction & his Simva20/ FishOil/ & wine daily (states DrBrodie told him to do this); FLP looks better w/ wt down to 214#...  DM>  Labs much improved w/ wt reduction...  GU>  He has BPH w/ LTOS, and TCCa of bladder, followed by [name] & pt reports surveillence cysto 4/12 was neg.Marland KitchenMarland Kitchen

## 2011-03-24 NOTE — Patient Instructions (Signed)
Today we updated your med list in EPIC...    Continue the same meds for now...  Keep up the great job w/ diet & exercise...  Today we did your follow up CXR & fasting blood work...    Please call the PHONE TREE in a few days for your results...    Dial [id number] & when prompted enter your patient number followed by the # symbol...    Your patient number is:  [id number]#  Call for any questions...  Let's plan a follow up visit in 6 months, sooner should the need arise.Marland KitchenMarland Kitchen

## 2011-03-25 ENCOUNTER — Telehealth: Payer: Self-pay | Admitting: Pulmonary Disease

## 2011-03-25 MED ORDER — CHLORZOXAZONE 500 MG PO TABS: 250.0000 mg | ORAL_TABLET | Freq: Four times a day (QID) | ORAL | Status: AC | PRN

## 2011-03-25 NOTE — Telephone Encounter (Signed)
Was stopped by [facility] while she had pharmacy on the phone.  Gave her the "okay" to give verbal for 500mg  chlorzaxazone, 1/2 tab daily > stood by her desk while this was done.  Will sign off on note.  TD is aware.  Appropriate change made on med list.

## 2011-03-26 ENCOUNTER — Encounter: Payer: Self-pay | Admitting: Pulmonary Disease

## 2011-03-30 NOTE — Assessment & Plan Note (Signed)
[facility]                            CARDIOLOGY OFFICE NOTE   NAME:[name], [name]                      MRN:          [id number]  DATE:07/24/2008                            DOB:          07-Jan-1946    PRIMARY CARE PHYSICIAN:  Lonzo Cloud. Kriste Basque, MD   CLINICAL HISTORY:  Mr. Siebenaler is 65 years old and returns for management  of his coronary heart disease.  He had a diaphragmatic wall infarction  treated with stenting of the mid right coronary artery with subsequent  PTCA for in-stent restenosis in 1995.  He had 2 new overlapping stents  in the proximal right coronary artery at that time.  In 2008, he had an  abnormal Myoview scan and was found to have a tight stenosis in the  proximal mid and distal right coronary artery and underwent placement of  3 non-overlapping Cypher stents.  This gave him a total of 6 stents in  the right coronary artery.  He has done quite well since that time.  He  has had no recent chest pain, shortness of breath, or palpitations.   PAST MEDICAL HISTORY:  Significant for hypertension, hyperlipidemia, and  COPD.   CURRENT MEDICATIONS:  1. Amlodipine 5 mg daily.  2. Lisinopril 20 mg daily.  3. Felodipine 5 mg daily.  4. Prilosec.  5. Nasonex.  6. Plavix.  7. Aspirin.  8. Advair.  9. Potassium.  10.Robitussin.  11.Fish oil.  12.Flaxseed oil.  13.Metoprolol.  14.Simvastatin 80 mg.   PHYSICAL EXAMINATION:  VITAL SIGNS:  The blood pressure is 152/89 and  pulse 73 and regular.  NECK:  There was no venous distention.  The carotid upstrokes were full  without bruits.  CHEST:  Clear.  HEART:  Rhythm is regular.  There are no murmurs or gallops.  ABDOMEN:  Soft.  No organomegaly.  Abdomen is protuberant.  There is no  hepatosplenomegaly.  EXTREMITIES:  There is trace peripheral edema and pedal pulses are  equal.   Electrocardiogram was normal.   IMPRESSION:  1. Coronary artery disease, status post remote diaphragmatic  wall      infarction in 1995 treated with stenting in the right coronary      artery with subsequent overlapping stents placed in the right      coronary artery and recently 3 non-overlapping Cypher stents placed      in the proximal mid and distal right coronary artery in 2008, now      stable.  2. Good left ventricular function.  3. Hypertension.  4. Hyperlipidemia.  5. Tobacco use.   RECOMMENDATIONS:  I think, Mr. Penrod is doing well.  I will need to  encourage him about smoking.  His blood pressure is not optimal.  He is  on 2 ACE inhibitors.  I think, we can simplify and adjust his regimen.  We will have an increase of Felodipine from 5 to 10 a day until he runs  out and then, we will have him go on lisinopril 40 mg a day.  We will  check his BMP and blood pressure a  week after this change.  He can  double up on the 20 mg of lisinopril until he runs out and we will give  him a new prescription for 40.  I will plan to see him back in followup  in a year.      [name], MD, [facility]  Electronically Signed    BRB/MedQ  DD: 07/24/2008  DT: 07/25/2008  Job #: [id number]   cc:   Lonzo Cloud. Kriste Basque, MD

## 2011-03-30 NOTE — Assessment & Plan Note (Signed)
[place] [facility]                            CARDIOLOGY OFFICE NOTE   NAME:Kalisz, BERYLE ZEITZ                      MRN:          [id number]  DATE:07/25/2007                            DOB:          [birth year]    CLINICAL HISTORY:  Mr. Lambert is 65 years old.  In 1995, he had a  diaphragmatic wall infarction treated with stenting to the mid right  coronary artery with subsequent PTCA for in-stent restenosis and  placement of 2 overlapping stents in the proximal right coronary artery.  He recently had an abnormal Myoview scan, although he had no symptoms of  angina and underwent catheterization.  He was found to have tight  lesions in the proximal, mid, and distal right coronary artery, 2 of  which were in-stent restenotic lesions.  We put 3 non-overlapping CYPHER  stents in the proximal, mid, and distal vessel.  He has done quite well  since that time and had a Myoview scan recently, which showed no  evidence of ischemia.   He had an IVIS to see if he would qualify for the SATURN trial, but  apparently, his LDL was too low.   PAST MEDICAL HISTORY:  Significant for hypertension, hyperlipidemia, and  COPD.   CURRENT MEDICATIONS:  Lisinopril.  Felodipine.  Prilosec.  Nasonex.  Simvastatin.  Plavix.  Aspirin.  Advair.  Potassium.  Metoprolol.   EXAMINATION:  Blood pressure is 132/73, pulse 65 and regular.  There was no venous distension.  The carotid pulses were full without  bruits.  CHEST:  Clear without rales or rhonchi.  CARDIAC:  Rhythm was regular.  I could hear no murmurs or gallops.  ABDOMEN:  Soft with normal bowel sounds.  Peripheral pulses were full and there is no peripheral edema.   IMPRESSION:  1. Coronary artery disease status post remote diaphragmatic wall      infarction in 1995, treated with stenting in the right coronary      artery with recent placement of 3 non-overlapping CYPHER stents in      the proximal, mid, and distal right  coronary artery for in-stent      restenotic lesions now stable with negative Myoview scan.  2. Good left ventricular function.  3. Hyperlipidemia.  4. Hypertension.  5. Tobacco use.   RECOMMENDATIONS:  I think Mr. Kazee is doing well.  He needs to see Dr.  Kriste Basque next month and he is going to follow up on his cholesterol  readings.  Will need to counsel him on  smoking.  His HDL has been low, and I talked to him about low glycemic  diet.  We will plan to see him back in followup in a year.     [name], MD, [facility]  Electronically Signed    BRB/MedQ  DD: 07/25/2007  DT: 07/26/2007  Job #: [id number]

## 2011-04-02 NOTE — Assessment & Plan Note (Signed)
[facility]                            CARDIOLOGY OFFICE NOTE   NAME:[name], Andrew BERNARDS                    MRN:          [id number]  DATE:01/09/2007                            DOB:          [birth year]    PRIMARY CARE PHYSICIAN:  Lonzo Cloud. Kriste Basque, MD   REASON FOR REFERRAL:  Cardiac evaluation in a patient with known and  previous stent procedure.   CLINICAL HISTORY:  [name] is 65 years old and has known coronary  disease. He had three non-overlapping bare metal stents placed in his  right coronary in 1999. It was a difficult procedure, but he  subsequently has done quite well. He has had no recent chest pain,  shortness of breath or palpitations.   He saw Dr. Kriste Basque recently for a complete physical evaluation. A friend  of his, who was a patient of Dr. Jodelle Green, died suddenly so he became  concerned about his potential risk and he and Dr. Kriste Basque decided to  arrange this cardiac consultation today.   PAST MEDICAL HISTORY:  Significant for:  1. Hypertension.  2. Hyperlipidemia.  3. Chronic obstructive pulmonary disease with continued cigarette use.  4. There is no history of diabetes.   CURRENT MEDICATIONS:  1. Toprol.  2. Norvasc.  3. Zocor.  4. Zestoretic.  5. Astelin.  6. Advair.  7. Prilosec.  8. Minitabs.  9. Nasonex.   SOCIAL HISTORY:  He retired about a year ago and was previously I  believe a Chartered certified accountant. He will be on [employer] shortly. He smokes  about a half a pack of cigarettes a day and lives with his wife.   FAMILY HISTORY:  Is positive in that his father had a heart attack and  stroke, but lived to the age of 16. He has a brother who had an  myocardial infarction and stents and is living.   REVIEW OF SYSTEMS:  Is positive for symptoms of reflux.   PHYSICAL EXAMINATION:  Blood pressure is 129/84 and the pulse is 76 and  regular.  There was no venous distention. The carotid pulses were full without  bruits.  CHEST:  Was clear without rales or rhonchi.  CARDIAC: Rhythm was regular. The heart sounds were normal and there were  no murmurs or gallops.  ABDOMEN: Was soft with normal bowel sounds. There was no  hepatosplenomegaly.  The peripheral pulses were full and there was no peripheral edema.  MUSCULOSKELETAL: System showed no deformities.  SKIN: Was warm and dry.  NEUROLOGIC: Examination showed no focal neurological signs.   An ECG was normal.   IMPRESSION:  1. Coronary artery disease, status post prior placement of three non-      overlapping stents in the right coronary artery in 1999.  2. Good left ventricular function.  3. Hypertension.  4. Hyperlipidemia.  5. Chronic obstructive pulmonary disease.   RECOMMENDATIONS:  Mr. Restivo appears to be stable from the standpoint of  cardiac disease. He has lost 30 pounds and he has cut back on his  cigarette smoking, but his risk factor modification is still not ideal.  His last  cholesterol showed an HDL of 22 and an LDL of 108, so he is  still not at target. I will increase Zocor from 20 to 40 a day, but I  think he would prefer to stay on generic medication so this may be the  best we can do. Will arrange for him to have an adenosine rest/stress  Myoview scan to rule out silent ischemia. I encouraged him to continue  work on cutting back on the cigarettes and to continue to work on losing  weight. If his Myoview scan is negative, I will leave his other followup  to Dr. Kriste Basque.     [name], MD, [facility]  Electronically Signed    BRB/MedQ  DD: 01/09/2007  DT: 01/09/2007  Job #: [id number]

## 2011-04-02 NOTE — Discharge Summary (Signed)
[name], [name]               ACCOUNT NO.:  [id number]   MEDICAL RECORD NO.:  [id number]          PATIENT TYPE:  OIB   LOCATION:  6532                         FACILITY:  [facility]   PHYSICIAN:  Bruce R. Juanda Chance, MD, FACCDATE OF BIRTH:  1946-06-25   DATE OF ADMISSION:  02/14/2007  DATE OF DISCHARGE:  02/15/2007                               DISCHARGE SUMMARY   DISCHARGING DIAGNOSES:  1. Coronary artery disease status post cardiac catheterization with      percutaneous coronary intervention of the tandem lesions in the      proximal mid and distal right coronary artery using Taxus drug-      eluting stents with improvement in the distal stenosis from 90 to      0% and improvement in mid in-stent restenosis from 80 to 0% and      improvement in the proximal in-stent restenosis from 80 to 0%.      Also intravascular ultrasound IVUS of the circumflex artery as part      of the SATURN trial.  2. Participant in the SATURN trial research study of statins secondary      to hyperlipidemia.  3. Ongoing tobacco use.   PAST MEDICAL HISTORY:  1. Coronary artery disease status post 3 non-overlapping bare metal      stents in the right coronary artery in 1999.  2. Chronic obstructive pulmonary disease with ongoing tobacco use.  3. Hyperlipidemia.  4. Hypertension.   PROCEDURES THIS ADMISSION:  Cardiac catheterization on February 14, 2007  with intervention per Dr. Charlies Constable.   HOSPITAL COURSE:  [name] is a 65 year old Caucasian gentleman with a  known history of coronary artery disease status post cardiac  catheterization on February 10, 2007.  The patient was found to have  coronary artery disease status post remote diaphragmatic wall infarction  and remote PTCA of the right coronary artery.  [name] has developed  multiple restenotic sites in the right coronary artery.  He underwent  intervention with Taxus drug-eluting stents on February 14, 2007 as  described above.  The patient tolerated  the procedure without  complications.  On the day of discharge, he is afebrile, heart rate 76,  blood pressure 113/64, saturation 93% on room air.  Sinus rhythm without  ectopy.  Hemoglobin and hematocrit of 13.7 and 39.7.  Potassium 3.8, BUN  and creatinine 10 and 0.89.  Cath site to right groin stable.  Patient  being discharged home after being seen by Dr. Juanda Chance.  Follow-up  appointment with Dr. Juanda Chance February 27, 2007 at 11:15.  The patient will  also be seen by the research team at [name] on that same  date as a participant in the SATURN drug study.   MEDICATIONS AT DISCHARGE:  1. As previously, Toprol XL 75 mg daily.  2. Lisinopril/HCT 20/25 mg daily.  3. Aspirin 325.  4. Amlodipine 5 mg daily.  5. Advair 250/50 one puff b.i.d.   The patient was started on Chantix therapy while an inpatient.  He is  not sure that he will be able to afford this  at home; however, I have  written a prescription for the Chantix starter pack and also  prescription for Wellbutrin 150 mg daily.  The patient has been  instructed to use either of these, whichever one he can afford.  I have  also given a prescription for his nitroglycerin, his Plavix, lisinopril  and Toprol, which he is to continue as previously taken.  Also  reinforced smoking cessation with him and a heart-healthy diet.  He has  been given the post cardiac catheterization discharge instructions and  agrees to call our office for any problems from his cath site prior to  his appointment.   DURATION OF DISCHARGE ENCOUNTER:  Thirty minutes.      Dorian Pod, ACNP      Bruce R. Juanda Chance, MD, [facility]  Electronically Signed    MB/MEDQ  D:  02/15/2007  T:  02/15/2007  Job:  [id number]   cc:   Andrew Cloud. Kriste Basque, MD

## 2011-04-02 NOTE — Assessment & Plan Note (Signed)
[facility]                            CARDIOLOGY OFFICE NOTE   NAME:[name], [name]                      MRN:          [id number]  DATE:02/23/2007                            DOB:          1945-12-28    PRIMARY CARE PHYSICIAN:  Dr. Lonzo Cloud. [name].   CLINICAL HISTORY:  [name] is 65 years old and returns for a follow  up visit after his recent percutaneous coronary intervention.  He had a  out of hospital diaphragmatic wall infarction in 1995 with subsequent  stenting in the right mid coronary artery and then had PTCA for in-stent  restenosis and placement of 2 non-overlapping stents in the proximal  right coronary artery.  He recently had an abnormal Myoview scan,  although he had no symptoms of angina.  This showed inferior ischemia.  He was studied in the outpatient laboratory  and found to have a tight  stenoses in the proximal mid and distal right coronary artery.  We  brought him back in for intervention and we treated him with 3 non-  overlapping Cypher drug-eluting stents.  He also had IVUS of a  circumflex lesion from the Saturn trial.  We did IVUS in the both the AV  circumflex and the marginal.  The marginal had a lesion which appeared  much tighter by IVUS than it did by angiography.   He has done quite well since that time.  He has no symptoms of chest  pain, shortness of breath or palpitations.  His LDL was too low for him  to qualify for the Saturn trial.   PAST MEDICAL HISTORY:  Significant for hypertension, hyperlipidemia and  COPD.   CURRENT MEDICATIONS:  Toprol, lisinopril, aspirin, Norvasc, Advair,  Zocor and Plavix.   EXAMINATION:  VITAL SIGNS:  The blood pressure is 115/74 and the pulse  73 and regular.  NECK:  There was no vein distension.  The carotid pulses were full  without bruits.  CHEST:  Clear.  CARDIAC:  Rhythm was regular without murmurs or gallops.  ABDOMEN:  Soft without organomegaly.  EXTREMITIES:   Peripheral pulses were full with no peripheral edema.  The  right femoral artery site had a small knot, but there was no widened  pulsation.   IMPRESSION:  1. Coronary artery disease status post remote diaphragmatic wall      infarction status post multiple percutaneous coronary interventions      with recent placement of 3 non-overlapping Cypher drug-eluting      stents in the proximal, mid and distal right coronary artery for in-      stent  restenosis in the proximal and mid artery for a non-stent      restenosis in the distal right coronary artery.  2. Good left ventricular function.  3. Hyperlipidemia.  4. Hypertension.  5. Tobacco use.   RECOMMENDATIONS:  I think [name] is doing well.  We will plan to  continue his current medications.  We will plan to see him back 6 months  from the time of his intervention, which will be September and we will  plan a repeat Myoview scan prior to that visit.     [name], MD, [facility]  Electronically Signed    BRB/MedQ  DD: 02/23/2007  DT: 02/23/2007  Job #: [id number]   cc:   Andrew Cloud. Kriste Basque, MD

## 2011-04-02 NOTE — Cardiovascular Report (Signed)
[name], [name]               ACCOUNT NO.:  [id number]   MEDICAL RECORD NO.:  [id number]          PATIENT TYPE:  OIB   LOCATION:  2807                         FACILITY:  [facility]   PHYSICIAN:  Andrew Beals. Juanda Chance, MD, FACCDATE OF BIRTH:  [birth year]   DATE OF PROCEDURE:  02/14/2007  DATE OF DISCHARGE:                            CARDIAC CATHETERIZATION   HISTORY:  Mr. [name] is 65 years old.  In 1995, he had an out of  hospital diaphragmatic wall infarction with sepsis stenting of the mid-  right coronary artery.  In 1996, he developed restenosis and had a PTCA  for in-stent restenosis and two non-overlapping stents in the proximal  right coronary in addition to the proximal right coronary artery.  Recently, Dr. Kriste [name] referred him for further evaluation because he was  quite concerned that a friend had died, and we did a Myoview scan which  showed inferior ischemia.  The was studied in the outpatient laboratory  and found to have three tight lesions in the proximal and mid right  coronary arteries within the stent, and a separate lesion in the distal  right coronary.  We brought him back today for intervention.   PROCEDURE:  The procedure was followed with a right femoral arterial  sheath and a 6-French JR-4 guiding catheter with side holes.  The  patient given Angiomax bolus and infusion.  He was given chewable  aspirin.  He had been on Plavix load from last week.  We passed a  Prowater wire across these without difficulty.  We predilated all three  lesions with a 2.25 x 10 mm cutting balloon, performing one inflation up  to 8 atmospheres at each site.  We then deployed a 2.5 x 13 mm Cypher  stent in the distal lesion.  We post dilated it with a 2.75 x 12-mm  Quantum Maverick up to 16 atmospheres.  We then deployed a 2.5 x 18 mm  Cypher stent within the in-stent restenosis in the mid-right coronary  artery, deployed this with one inflation of 15 atmospheres for 30  seconds.  We post  dilated with a 2.75 x 12-mm Quantum Maverick following  two inflations up to 16 atmospheres for 30 seconds.  We then treated the  proximal in-stent restenosis with a long 28 mm x 3 mm Cypher stent  deploying this with one inflation of 16 atmospheres for 30 seconds.  We  post dilated with a 3.75 x 20-mm Quantum Maverick performing three  inflations up to 18 atmospheres for 30 seconds.  We then performed an  IVUS run and documented that there was some malaposition of the distal  and mid stents at the proximal portion where the vessel became more  ectatic.  For this reason, we dilated the proximal portion of the distal  stent with a 3.0 x 8 mm Quantum Maverick performing one inflation up to  15 atmospheres for 30 seconds.  We post dilated the proximal portion of  the mid stent with a 3.75 x 8-mm Quantum Maverick.  Final diagnostic  study was then prompted with the guiding catheter.   The  patient had been enrolled in the Saturn Trial and we performed  intravascular ultrasound on the circumflex artery.  We used a CLS4  guiding catheter with side holes.  We initially passed a Prowater wire  down the AV circ and did automatic pullback.  There was not very much  plaque in this vessel, so we also decided to IVUS the marginal branch to  the circumflex artery.  We passed the Prowater wire down the marginal  branch and did automatic pullback with the Atlantis catheter.  Final  diagnostic study was then prompted with the guiding catheter.   The patient tolerated procedure well and left the laboratory in  satisfactory condition.  The right femoral was too small to close with  AngioSeal.   RESULTS:  Initially, the in-stent restenosis in the proximal right groin  was estimated at 80% and following stenting has improved to 0%.   The in-stent restenosis in the mid artery was initially 80% and  following stenting has improved to 0%.  The stenosis in the distal  portion of the right coronary was  initially 90% and following stenting  has improved to 0%.   The IVUS run of the AV circumflex showed minimal plaque.   The IVUS run of the circumflex artery showed that there was a moderately  tight lesion that in its smallest dimension was 2.0 x 1.5 mm.  The  reference vessel was 4 mm, although the reference lumen in its best  portion was 3 mm in diameter.   CONCLUSION:  1. Successful PCI of tandem lesions in the proximal mid and distal      right coronary artery using Taxus drug-eluting stents with      improvement in the distal stenosis from 90% to 0% and improvement      in the mid in-stent restenosis from 80% to 0% and improvement in      the proximal in-stent restenosis from 80% to 0%.  2. IVUS of the circumflex artery as part of the Saturn Trial.   RECOMMENDATIONS:  The patient will return for further observation.  He  will be at somewhat increased risk for stent thrombosis because of the  multiple stents and because of the use of the stent for in-stent  restenosis.  We made every attempt to optimize our results using IVUS  guidance.  He also has what appears to be a tight lesion in the  circumflex marginal vessel which did not show up as being ischemic on  his Myoview scan.  Will plan to follow that lesion, but will plan a  follow-up Myoview scan in 6 months.      [name], MD, [facility]  Electronically Signed     BRB/MEDQ  D:  02/14/2007  T:  02/14/2007  Job:  [id number]   cc:   Lonzo Cloud. Andrew Basque, MD  Cardiopulmonary

## 2011-04-02 NOTE — H&P (Signed)
[name], [name]               ACCOUNT NO.:  [id number]   MEDICAL RECORD NO.:  [id number]           PATIENT TYPE:   LOCATION:                                 FACILITY:   PHYSICIAN:  Bruce R. Juanda Chance, MD, [facility]   DATE OF BIRTH:   DATE OF ADMISSION:  02/14/2007  DATE OF DISCHARGE:                              HISTORY & PHYSICAL   CLINICAL HISTORY:  The patient is 65 years old and in 1995 he had an  diaphragmatic wall infarction with subsequent stenting of the mid-right  coronary artery.  He developed restenosis in 1996 and had PTCA for in-  stent restenosis with two non-overlapping stents in the proximal right  in addition to the previous stent.  I saw him recently in office for  further evaluation and we did a Myoview scan which showed inferior  ischemia.  For this reason, he is brought in for evaluation with  angiography.  He has had no recent chest pain.   PAST MEDICAL HISTORY:  Significant for:  1. Chronic obstructive pulmonary disease with continued tobacco use.  2. Hyperlipidemia.  3. Hypertension.   MEDICATIONS:  Include:  1. Toprol XL 75 mg daily.  2. Lisinopril/hydrochlorothiazide 20/25 mg daily.  3. Aspirin 325 mg daily.  4. Lotrel 5 mg daily.  5. Advair 250/50 one puff b.i.d.   SOCIAL HISTORY/FAMILY HISTORY:  As per in the old records.   REVIEW OF SYSTEMS:  Positive for fatigue.   PHYSICAL EXAMINATION:  VITAL SIGNS:  The blood pressure was 113/60,  pulse 70 and regular.  NECK:  There was no venous distention. Carotids without bruits.  CHEST:  Had decreased breath sounds.  CARDIOVASCULAR:  Heart rhythm was regular.  I could hear no murmurs or  gallops.  ABDOMEN:  Soft.  Normal bowel sounds.  EXTREMITIES:  There was no peripheral edema.   IMPRESSION:  1. Coronary artery disease status post prior diaphragmatic wall      infarction and status post multiple percutaneous interventions as      described above with recent abnormal Myoview scan.  2. Good left  ventricular function.  3. Hypertension.  4. Hyperlipidemia.  5. Coronary artery disease.   PLAN:  Will plan to evaluate patient with catheterization today.      [name], MD, [facility]  Electronically Signed     BRB/MEDQ  D:  09/28/2007  T:  09/28/2007  Job:  [id number]

## 2011-04-02 NOTE — Cardiovascular Report (Signed)
[name], [name]               ACCOUNT NO.:  [id number]   MEDICAL RECORD NO.:  [id number]          PATIENT TYPE:  INP   LOCATION:  6532                         FACILITY:  [facility]   PHYSICIAN:  Andrew R. Juanda Chance, MD, FACCDATE OF BIRTH:  [birth year]   DATE OF PROCEDURE:  02/14/2007  DATE OF DISCHARGE:  02/15/2007                            CARDIAC CATHETERIZATION   CLINICAL HISTORY:  [name] is 65 years old and a patient of Dr.  Kriste Basque.  He had a remote diaphragmatic wall infarction and remote PTCA of  the right coronary artery.  He underwent catheterization on March 28 in  the outpatient laboratory and was found to have multiple restenotic  lesions in the right coronary artery and was brought back for  intervention.  He has chronic obstructive pulmonary disease with  continued cigarette use, hypertension, hyperlipidemia.   PROCEDURE:  The procedure was performed by the right femoral artery  using an arterial sheath and a JR-4-guiding catheter with side holes.  Patient was given Angiomax bolus and infusion and had been previously  loaded with Plavix and aspirin.  We passed the ProWater wire down the  wire without difficulty.  We initially went in with a 2.25 x 10 mm  cutting balloon, performed several inflations at 8 atmospheres for 20  seconds.  We then deployed a 2.5 x 13 mm CYPHER stent in the distal  right coronary artery and post dilated this with a 2.75 and 3.0 Quantum  Maverick up to 16 atmospheres for 20 seconds.  We then deployed a 2.5 x  18 mm CYPHER stent in the mid-right coronary artery and post dilated  this with a 2.75 and a 3.75 Quantum Maverick with inflations up to 16  atmospheres for 20 seconds.  We finally deployed a 3.0 x 28 mm CYPHER  stent in the proximal right coronary artery and post dilated this with a  3.75 Quantum Maverick.   Following completion of the coronary intervention, we performed  intravascular ultrasound in the circumflex artery with an [facility]  thymus  catheter and automatic pullback as part of the Saturn Trial.   RESULTS:  Initially, the stenosis in the distal right coronary artery  was 90%; this improved to 0% with a drug-eluting stent.  The stenosis in  the mid-right coronary artery which was an in-stent restenosis improved  from 80% to 0% with the use of a CYPHER drug-eluting stent.  Stenosis in  the proximal right groin which was also an in-stent restenotic lesion  improved from 80% to 0% with a CYPHER drug-eluting stent.   Intravascular ultrasound showed a mild plaque in AV circumflex and  mildly tight lesion with calcification in the marginal branch of the  circumflex artery.   CONCLUSION:  1. Successful PCI of tandem lesions in the proximal, mid and distal      right coronary artery with improvement in distal stenosis from 90%      to 0% using a CYPHER drug-eluting stent, improvement in the mid in-      stent restenosis from 80% to 0% using a CYPHER drug-eluting stent  and improvement in the proximal in-stent restenosis from 80% to 0%      using a CYPHER drug-eluting stent.  2. Moderately severe plaque in the obtuse marginal branch of the      circumflex artery performed by intravascular ultrasound as part of      the Saturn Trial.   DISPOSITION:  The patient to return post intervention for further  observation.      Andrew Elvera Lennox Juanda Chance, MD, [facility]  Electronically Signed     BRB/MEDQ  D:  06/29/2007  T:  06/29/2007  Job:  [id number]

## 2011-04-02 NOTE — Cardiovascular Report (Signed)
NAME:  [name], [name]               ACCOUNT NO.:  [id number]   MEDICAL RECORD NO.:  [id number]          PATIENT TYPE:  OIB   LOCATION:  NA                           FACILITY:  [facility]   PHYSICIAN:  Bruce R. Juanda Chance, MD, FACCDATE OF BIRTH:  1946/06/22   DATE OF PROCEDURE:  02/10/2007  DATE OF DISCHARGE:                            CARDIAC CATHETERIZATION   HISTORY:  [name] is 65 years old.  In 1995, he had an out-of-  hospital diaphragmatic wall infarction with subsequent stenting of the  mid right coronary artery.  In 1996, he developed restenosis and had  PTCA for in-stent restenosis and had two non-overlapping stents in the  proximal right coronary artery in addition to the stent in the mid right  coronary artery.  He has done well since that time.  He recently had a  friend who died, and he called [name] and they arranged for him to  come back in followup.  He had not been having any angina, but we did a  Myoview scan which suggested inferior ischemia.  For this reason we  brought him in for a catheterization.   PROCEDURE:  The procedure was performed via the right femoral with an  arterial sheath 4-French preformed coronary artery catheters.  A front  wall arterial puncture was performed and nonopaque contrast was used.  The patient tolerated the procedure well and left the laboratory in  satisfactory condition.   RESULTS:  The aortic pressure was 103/62 with a mean of 80 and left  ventricular pressure was 103/7   The left main coronary artery was free of significant disease.   Left anterior descending artery gave rise to a diagonal branch and four  septal perforators.  The LAD was irregular.  There was 30% proximal  stenosis and 40% mid stenosis.   The circumflex artery gave rise to a marginal branch and a second  marginal branch and a posterolateral branch.  There was 40% narrowing in  the first marginal branch.   The right coronary artery had moderate calcification.   The vessel gave  rise to a conus branch, a right ventricular branch, a posterior  descending branch and two posterolateral branches.  Because of the  calcification it was somewhat difficult to identify the three stents,  but it appeared that there were two non-overlapping stents in the  proximal right coronary artery and one non-overlapping stent in the mid  right coronary.  There was 90% stenosis within the first stent and 80%  stenosis within the second stent with some segmental disease between the  two stents.  The first stent was a [id number] and the second stent was a  [id number].  There was 80% narrowing within the stent in the mid vessel.  There was 90% stenosis in the distal vessel.   The left ventriculogram performed in the RAO projection showed  hypokinesis of the inferobasal segment.  The overall ejection fraction  was estimated at 55%.   CONCLUSION:  Coronary artery disease status post remote diaphragmatic  wall infarction and remote PTCA of the right coronary artery with 30%  narrowing in the proximal LAD and 40% narrowing in the mid LAD, 40%  narrowing in the marginal branch of the circumflex artery, 90% stenosis  within the first stent in the proximal right coronary artery and 80%  stenosis within the second stent in the proximal right coronary artery,  80% narrowing within the stent in the mid right coronary artery and 90%  stenosis in the distal right coronary artery with inferobasal wall  hypokinesis and estimated fraction of 55%.   RECOMMENDATIONS:  [name] has developed multiple restenotic sites in  the right coronary artery.  Will plan to bring him back next week for  intervention.      [name], MD, [facility]  Electronically Signed     BRB/MEDQ  D:  02/10/2007  T:  02/10/2007  Job:  [id number]   cc:   Andrew Cloud. Kriste Basque, MD  Cath Lab

## 2011-04-05 ENCOUNTER — Other Ambulatory Visit: Payer: Self-pay | Admitting: Pulmonary Disease

## 2011-09-30 ENCOUNTER — Ambulatory Visit: Payer: Medicare Other | Admitting: Pulmonary Disease

## 2011-10-13 ENCOUNTER — Other Ambulatory Visit: Payer: Self-pay | Admitting: Pulmonary Disease

## 2011-10-19 ENCOUNTER — Encounter: Payer: Self-pay | Admitting: Pulmonary Disease

## 2011-10-19 ENCOUNTER — Ambulatory Visit (INDEPENDENT_AMBULATORY_CARE_PROVIDER_SITE_OTHER): Payer: Medicare Other | Admitting: Pulmonary Disease

## 2011-10-19 ENCOUNTER — Other Ambulatory Visit: Payer: Self-pay | Admitting: Pulmonary Disease

## 2011-10-19 DIAGNOSIS — F411 Generalized anxiety disorder: Secondary | ICD-10-CM

## 2011-10-19 DIAGNOSIS — E7800 Pure hypercholesterolemia, unspecified: Secondary | ICD-10-CM

## 2011-10-19 DIAGNOSIS — E669 Obesity, unspecified: Secondary | ICD-10-CM

## 2011-10-19 DIAGNOSIS — M1990 Unspecified osteoarthritis, unspecified site: Secondary | ICD-10-CM

## 2011-10-19 DIAGNOSIS — I10 Essential (primary) hypertension: Secondary | ICD-10-CM

## 2011-10-19 DIAGNOSIS — J449 Chronic obstructive pulmonary disease, unspecified: Secondary | ICD-10-CM

## 2011-10-19 DIAGNOSIS — D126 Benign neoplasm of colon, unspecified: Secondary | ICD-10-CM

## 2011-10-19 DIAGNOSIS — C679 Malignant neoplasm of bladder, unspecified: Secondary | ICD-10-CM

## 2011-10-19 DIAGNOSIS — R7309 Other abnormal glucose: Secondary | ICD-10-CM

## 2011-10-19 DIAGNOSIS — F17200 Nicotine dependence, unspecified, uncomplicated: Secondary | ICD-10-CM

## 2011-10-19 DIAGNOSIS — I2510 Atherosclerotic heart disease of native coronary artery without angina pectoris: Secondary | ICD-10-CM

## 2011-10-19 NOTE — Patient Instructions (Signed)
Today we updated your med list in our EPIC system...    Continue your current medications the same...  Keep up the good work w/ your diet & increase your exercise program (just not on the roof!)...  Call for any problems...  Let's plan a follow up visit in 6 months w/ CXR & FASTING blood work at that time.Marland KitchenMarland Kitchen

## 2011-10-19 NOTE — Progress Notes (Signed)
Subjective:    Patient ID: [name], male    DOB: [birth year], 65 y.o.   MRN: [id number]  HPI 65 y/o WM here for a follow up visit and on-going management of mult med problems...   ~  Mar 26, 2010:  stable 457mo- but still smoking 5-10 cig/d, hasn't lost weight (actually gained 6#), not exercising etc... BP controlled- denies CP,worsening dyspnea, etc... due for CXR & fasting labs...  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 he is now sched for 6 BCG instillations... he was seen by DrBBrodie 9/11- preop eval OK for surg (hx CAD, prev IWMI & several PCIs> stable, no angina, & he tol surg well...   ~  Mar 24, 2011:  457mo ROV & doing well he says> in the interim he has had f/u w/ [name] & [name]; smoking "less"; BP controlled; weight down nicely w/ diet & exercise;  Due for f/u CXR & fasting labs> see below:  ~  October 19, 2011:  57mo ROV & doing satis he says w/o new complaints or concerns at this time... We reviewed his prev labs from 5/12...    He saw [name] 10/12 for f/u hematuria & TCCa of bladder w/ TURBT 9/11 & subseq 6wk course of BCG when f/u cysto & bx revealed CIS; his last cysto was 1/12 w/ repeat bx all neg for TCCa/ CIS; repeat cysto 10/12 was also neg x scars where prev bx were done (f/u planned 52yr)...        Problem List:  Hx of SINUSITIS (ICD-473.9) - on NASONEX 2spQhs & recent Rx w/ Augmentin/ Saline... notes occas sinus congestion, drainage, sl HA, etc... he knows that he needs to quit smoking! ~  10/10: Adm for epistaxis w/ eval by DrWolicki- CXR= NAD, labs OK, rec for saline lavage- nose bleed resolved & disch home (no recurrence so far).  COPD (ICD-496) - on ADVAIR  250Bid; STILL SMOKES 5-10 cig/d & he's refused Chantix...  ~  He had hemoptysis 6/99 without lesion on CXR, CTChest, or Bronchoscopy... ~  Adm 10/10 w/ epistaxsis & eval DrWolicki as noted> he wanted to do Sleep Study but pt cancelled it & declined to resched. 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 ~  CXR 5/12 showed ectatic calcif & tort Aorta, COPD w/ incr markings ar bases, NAD, +degen spondylosis.  HYPERTENSION (ICD-401.9) - controlled on METOPROLOL 25mg Tid,  NORVASC 10mg /d, LISINOPRIL 40mg /d...   ~  11/11:  BP=122/70, not checking BP's at home... denies HA, fatigue, visual changes, CP, palipit, dizziness, syncope, dyspnea, edema, etc. ~  5/12:  BP= 120/72, pt very pleased w/ his wt loss & improved exercise ability.  CORONARY ARTERY DISEASE (ICD-414.00) - ASA 81mg /d & PLAVIX 75mg /d...   ~  Hx prev IWMI and PTCA/ stent in RCA by DrBrodie 12/95 & 5/96...  ~  Myoview 4/08 abnormal w/ inferior ischemia and subseq cath revealing restenoses in RCA- s/p repeat PTCA/ stents...  ~  f/u Myoview 9/08 w/ prev infer infarct, no ischemia, EF=56%... ~  9/11:  pre op cardiac eval DrBrodie> OK to hold ASA/ Plavix for bladder surg. ~  2/12: f/u [name] w/ hx CAD, MI in  95, mult PCIs last 2009> stable, on ASA/ Plavix, no changes made; EKG= NSR, WNL...  HYPERCHOLESTEROLEMIA (Mixed Hyperlipidemia) - prev on Simva80 but DrBrodie decr him to SIMVASTATIN 20mg /d + Fish Oil & Red Wine qd... ~  FLP 9/08 on Simva40 showed TChol 151, TG 106, HDL 23, LDL 107 ~  FLP 8/09 on Simva80 showed TChol 142, TG 135, HDL 28, LDL 87 ~  FLP 6/10 on Simva80 showed TChol 151, TG 117, HDL 31, LDL 97 ~  FLP in hosp 10/10 showed TChol 128, TG 110, HDL 26, LDL 80 ~  FLP 5/11 on Simva80 showed TChol 144, TG 215, HDL 26, LDL 89 ~  9/11:  DrBrodie decr him to Simva20... we discussed the need for better diet & wt reduction. ~  FLP 5/12 on Simva20 showed TChol 162, TG 159, HDL 32, LDL 98  DIABETES MELLITUS, BORDERLINE  (ICD-790.29) - on diet alone... ~  labs 9/08 showed BS=100, HgA1c=6.1 ~  labs 8/09 showed BS= 107, A1c= 6.2 ~  labs 6/10 showed BS= 108, A1c= 6.2 ~  lab 5/11 (wt=244#) showed BS= 121. A1c= 6.7.[name]. he was told "get wt down, or start meds". ~  Labs 5/12 showed BS= 109, A1c= 6.1  OBESITY (ICD-278.00) - weight was down as low as 202# in 2/09, and steadily incr to 244# by 5/11... ~  weight 5/11 = 244#.[name].  we reviewed diet + exercise necessary to lose weight... ~  weight 11/11 = 217# (after dx & rx for bladder cancer) ~  Weight 5/12 = 214# but says it's 192# at home...  GASTRITIS (ICD-535.50) - had EGD 10/05 showing gastritis... on RANITADINE 300mg /d due to his Plavix Rx, off prev Omep.  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 ~  4/12:  Pt reported f/u cysto was neg, doing well... ~  10/12:  Note from [name] reviewed> cysto was neg, no recurrent tumors...  DEGENERATIVE JOINT DISEASE (ICD-715.90) - He has c/o right shoulder pain, hip pain, knee pain, and LBP> Rx ADVIL w/ some benefit... offered Ortho referral & he will decide.  ANXIETY (ICD-300.00) - on CHLORAZEPATE 7.5mg  Prn...   Past Surgical History  Procedure Date  . Cataract surgery/sub posterior vitrectomy in left eye 1996    Dr. Luciana Axe    Outpatient Encounter Prescriptions as of 10/19/2011  Medication Sig Dispense Refill  . ALPRAZolam (XANAX) 0.5 MG tablet TAKE ONE TABLET BY MOUTH THREE TIMES DAILY AS NEEDED  90 tablet  5  . amLODipine (NORVASC) 10 MG tablet Take 1 tablet (10 mg total) by mouth daily.  30 tablet  11  . aspirin 81 MG tablet Take 81 mg by mouth  daily.        . clopidogrel (PLAVIX) 75 MG tablet Take 1 tablet (75 mg total) by mouth daily.  30 tablet  11  . Flaxseed, Linseed, (FLAXSEED OIL) 1000 MG CAPS Take 1 capsule by mouth daily.        . Fluticasone-Salmeterol (ADVAIR DISKUS) 250-50 MCG/DOSE AEPB Inhale 1 puff into the lungs every 12 (twelve) hours.  60 each  11  . Ibuprofen (ADVIL PO) Take by mouth as  needed.        [name] lisinopril (PRINIVIL,ZESTRIL) 40 MG tablet Take 1 tablet (40 mg total) by mouth daily.  30 tablet  11  . metoprolol tartrate (LOPRESSOR) 25 MG tablet Take 2 tablets by mouth in the morning and 1 tablet by mouth at night  90 tablet  11  . mometasone (NASONEX) 50 MCG/ACT nasal spray 2 sprays by Nasal route 2 (two) times daily.  17 g  11  . Multiple Vitamin (MULTIVITAMIN) capsule Take 1 capsule by mouth daily.        . Multiple Vitamins-Minerals (OCUVITE ADULT 50+ PO) Take 1 tablet by mouth 2 (two) times daily.        . nitroGLYCERIN (NITROSTAT) 0.4 MG SL tablet Place 0.4 mg under the tongue every 5 (five) minutes as needed.        . Omega-3 Fatty Acids (FISH OIL) 1200 MG CAPS Take 1 capsule by mouth daily.        . ranitidine (ZANTAC) 300 MG capsule Take 1 capsule (300 mg total) by mouth every evening.  30 capsule  11  . simvastatin (ZOCOR) 20 MG tablet Take 1 tablet (20 mg total) by mouth at bedtime.  30 tablet  11  . DISCONTD: Acetaminophen (TYLENOL ARTHRITIS PAIN PO) Per bottle instructions         Allergies  Allergen Reactions  . Guiatuss Dm     REACTION: GI upset    Current Medications, Allergies, Past Medical History, Past Surgical History, Family History, and Social History were reviewed in [employer] record.    Review of Systems        See HPI - all other systems neg except as noted... The patient complains of some dyspnea on exertion.  The patient denies anorexia, fever, weight loss, weight gain, vision loss, decreased hearing, hoarseness, chest pain, syncope, peripheral edema,  prolonged cough, headaches, hemoptysis, abdominal pain, melena, hematochezia, severe indigestion/heartburn, hematuria, incontinence, muscle weakness, suspicious skin lesions, transient blindness, difficulty walking, depression, unusual weight change, abnormal bleeding, enlarged lymph nodes, and angioedema.     Objective:   Physical Exam      WD, Obese, 65 y/o WM in NAD... Vital Signs:  Reviewed... GENERAL:  Alert & oriented; pleasant & cooperative... HEENT:  Hydro/AT, EOM-wnl, PERRLA, EACs-clear, TMs-wnl, NOSE- sl congestion, THROAT-clear & wnl. NECK:  Supple w/ fairROM; no JVD; normal carotid impulses w/o bruits; no thyromegaly or nodules palpated; no lymphadenopathy. CHEST:  Clear to P & A; without wheezes/ rales/ or rhonchi heard... HEART:  Regular Rhythm; without murmurs/ rubs/ or gallops detected... ABDOMEN:  Obese, soft, & nontender; normal bowel sounds; no organomegaly or masses detected. EXT: without deformities, mild arthritic changes; no varicose veins/ +venous insuffic/ no edema. NEURO:  CN's intact; no focal neuro deficits... DERM:  No lesions noted; no rash etc...   Assessment & Plan:   COPD>  He is still smoking & understands the importance of smoking cessation; continue Advair, Mucinex prn, wt reduction/ exercise/ etc...  HBP>  Controlled on meds + diet & exercise program...  CAD>  On ASA/ Plavix & stable w/o angina, etc...  LIPIDS>  Improved on diet/ wt reduction & his Simva20/ FishOil/ & wine daily (states DrBrodie told him to do this); FLP looks better w/ wt down to 214#...  DM>  Labs much improved w/ wt reduction...  GI> Hx Gastritis, Polpyps>  On Ranitadine w/o symptoms & f/u colon due from DrStark in 2014...  GU>  He has BPH w/ LTOS,  and TCCa of bladder, followed by [name] & pt reports surveillence cysto 4/12 was neg.Marland KitchenMarland Kitchen

## 2011-11-12 ENCOUNTER — Encounter: Payer: Self-pay | Admitting: Pulmonary Disease

## 2011-12-09 DIAGNOSIS — Z8551 Personal history of malignant neoplasm of bladder: Secondary | ICD-10-CM | POA: Diagnosis not present

## 2011-12-09 DIAGNOSIS — D090 Carcinoma in situ of bladder: Secondary | ICD-10-CM | POA: Diagnosis not present

## 2012-01-04 ENCOUNTER — Other Ambulatory Visit (HOSPITAL_COMMUNITY): Payer: Self-pay | Admitting: Cardiovascular Disease

## 2012-01-04 DIAGNOSIS — I2510 Atherosclerotic heart disease of native coronary artery without angina pectoris: Secondary | ICD-10-CM

## 2012-01-05 ENCOUNTER — Ambulatory Visit (HOSPITAL_COMMUNITY): Payer: Medicare Other | Attending: Cardiovascular Disease | Admitting: Radiology

## 2012-01-05 VITALS — BP 132/76 | Ht 67.0 in | Wt 205.0 lb

## 2012-01-05 DIAGNOSIS — Z9861 Coronary angioplasty status: Secondary | ICD-10-CM | POA: Diagnosis not present

## 2012-01-05 DIAGNOSIS — I10 Essential (primary) hypertension: Secondary | ICD-10-CM | POA: Insufficient documentation

## 2012-01-05 DIAGNOSIS — I4949 Other premature depolarization: Secondary | ICD-10-CM

## 2012-01-05 DIAGNOSIS — Z8249 Family history of ischemic heart disease and other diseases of the circulatory system: Secondary | ICD-10-CM | POA: Insufficient documentation

## 2012-01-05 DIAGNOSIS — I252 Old myocardial infarction: Secondary | ICD-10-CM | POA: Insufficient documentation

## 2012-01-05 DIAGNOSIS — E669 Obesity, unspecified: Secondary | ICD-10-CM | POA: Diagnosis not present

## 2012-01-05 DIAGNOSIS — F17200 Nicotine dependence, unspecified, uncomplicated: Secondary | ICD-10-CM | POA: Insufficient documentation

## 2012-01-05 DIAGNOSIS — E785 Hyperlipidemia, unspecified: Secondary | ICD-10-CM | POA: Insufficient documentation

## 2012-01-05 DIAGNOSIS — I2510 Atherosclerotic heart disease of native coronary artery without angina pectoris: Secondary | ICD-10-CM | POA: Diagnosis not present

## 2012-01-05 MED ORDER — REGADENOSON 0.4 MG/5ML IV SOLN
0.4000 mg | Freq: Once | INTRAVENOUS | Status: AC
  Administered 2012-01-05: 0.4 mg via INTRAVENOUS

## 2012-01-05 MED ORDER — TECHNETIUM TC 99M TETROFOSMIN IV KIT
33.0000 | PACK | Freq: Once | INTRAVENOUS | Status: AC | PRN
  Administered 2012-01-05: 33 via INTRAVENOUS

## 2012-01-05 MED ORDER — TECHNETIUM TC 99M TETROFOSMIN IV KIT
11.0000 | PACK | Freq: Once | INTRAVENOUS | Status: AC | PRN
  Administered 2012-01-05: 11 via INTRAVENOUS

## 2012-01-05 NOTE — Progress Notes (Signed)
[facility] SITE 3 NUCLEAR MED [address] [place] [address] [phone]  Cardiology Nuclear Med Study  [name] is a 66 y.o. male [id number] [birth year]   Nuclear Med Background Indication for Stress Test:  Evaluation for Ischemia and PTCA/Stent Patency History: '95 Myocardial Infarction and Multiple Stentings;3/08 Stent-RCA; 4/08 PTCA/Stents-RCA; 9/08 OZH:[id number] inferior infarct, no ischemia, EF=56% Cardiac Risk Factors: Family History - CAD, Hypertension, Lipids, Obesity and Smoker  Symptoms:  No cardiac complaints.   Nuclear Pre-Procedure Caffeine/Decaff Intake:  None NPO After: 7:00pm   Lungs:  Clear.  O2 SAT 94% on RA. IV 0.9% NS with Angio Cath:  22g  IV Site: R Antecubital  IV Started by:  Stanton Kidney, EMT-P  Chest Size (in):  48 Cup Size: n/a  Height: 5\' 7"  (1.702 m)  Weight:  205 lb (92.987 kg)  BMI:  Body mass index is 32.11 kg/(m^2). Tech Comments:  Metoprolol held this am, as directed.    Nuclear Med Study 1 or 2 day study: 1 day  Stress Test Type:  Eugenie Birks  Reading MD: Charlton Haws, MD  Order Authorizing Provider:  Tonny Bollman, MD  Resting Radionuclide: Technetium 26m Tetrofosmin  Resting Radionuclide Dose: 11.0 mCi   Stress Radionuclide:  Technetium 63m Tetrofosmin  Stress Radionuclide Dose: 33.0 mCi           Stress Protocol Rest HR: 74 Stress HR: 110  Rest BP: 132/76 Stress BP: 128/84  Exercise Time (min): n/a METS: n/a   Predicted Max HR: 154 bpm % Max HR: 71.43 bpm Rate Pressure Product: 96295   Dose of Adenosine (mg):  n/a Dose of Lexiscan: 0.4 mg  Dose of Atropine (mg): n/a Dose of Dobutamine: n/a mcg/kg/min (at max HR)  Stress Test Technologist: Smiley Houseman, CMA-N  Nuclear Technologist:  Doyne Keel, CNMT     Rest Procedure:  Myocardial perfusion imaging was performed at rest 45 minutes following the intravenous administration of Technetium 7m Tetrofosmin.  Rest ECG:  No acute changes, occasional  PVC.  Stress Procedure:  The patient received IV Lexiscan 0.4 mg over 15-seconds.  Technetium 70m Tetrofosmin injected at 30-seconds.  There were no significant changes with Lexiscan.  Quantitative spect images were obtained after a 45 minute delay.  Stress ECG: No significant change from baseline ECG  QPS Raw Data Images:  Normal; no motion artifact; normal heart/lung ratio. Stress Images:  There is decreased uptake in the inferior wall. Rest Images:  There is decreased uptake in the inferior wall. Subtraction (SDS):  There is a fixed defect that is most consistent with a previous infarction. Transient Ischemic Dilatation (Normal <1.22):  1.12 Lung/Heart Ratio (Normal <0.45):  0.34  Quantitative Gated Spect Images QGS EDV:  110 ml QGS ESV:  45 ml QGS cine images:  NL LV Function; NL Wall Motion QGS EF: 58%  Impression Exercise Capacity:  Lexiscan with no exercise. BP Response:  Normal blood pressure response. Clinical Symptoms:  There is dyspnea. ECG Impression:  No significant ST segment change suggestive of ischemia. Comparison with Prior Nuclear Study: No images to compare  Overall Impression:  Low risk stress nuclear study. Small inferior wall infarct from apex to base with no ischemia.  EF preserved 58%   Charlton Haws

## 2012-01-11 ENCOUNTER — Encounter: Payer: Self-pay | Admitting: Cardiovascular Disease

## 2012-01-11 ENCOUNTER — Ambulatory Visit (INDEPENDENT_AMBULATORY_CARE_PROVIDER_SITE_OTHER): Payer: Medicare Other | Admitting: Cardiovascular Disease

## 2012-01-11 DIAGNOSIS — I10 Essential (primary) hypertension: Secondary | ICD-10-CM

## 2012-01-11 DIAGNOSIS — I2510 Atherosclerotic heart disease of native coronary artery without angina pectoris: Secondary | ICD-10-CM | POA: Diagnosis not present

## 2012-01-11 DIAGNOSIS — E7800 Pure hypercholesterolemia, unspecified: Secondary | ICD-10-CM | POA: Diagnosis not present

## 2012-01-11 NOTE — Assessment & Plan Note (Signed)
LDL cholesterol is less than 100. Total cholesterol when last checked was 162 with a low HDL of 32. The patient is on simvastatin 20 mg and fish oil.  Followup labs will be drawn this summer when he sees Dr. Kriste Basque in followup.

## 2012-01-11 NOTE — Patient Instructions (Signed)
Your physician has recommended you make the following change in your medication: STOP Plavix  Your physician wants you to follow-up in: 1 YEAR.  You will receive a reminder letter in the mail two months in advance. If you don't receive a letter, please call our office to schedule the follow-up appointment.

## 2012-01-11 NOTE — Assessment & Plan Note (Signed)
Blood pressure is well-controlled on multidrug therapy. The patient continues on amlodipine, lisinopril, and metoprolol.

## 2012-01-11 NOTE — Assessment & Plan Note (Signed)
The patient is stable without angina. He has a low risk Myoview as described above. He will continue with his current medical program, with the exception of discontinuation of Plavix. He has 2 years out from his last stenting procedure and he is experiencing side effects of Plavix. I think it is reasonable to discontinue it at this point. He should remain on aspirin 81 mg daily. Smoking cessation counseling was given today.

## 2012-01-11 NOTE — Progress Notes (Signed)
HPI:  This is a 66 year old gentleman with history of coronary artery disease and inferior myocardial infarction in 1995. He has undergone multiple PCI procedures, last in 2009 when he was treated with overlapping drug-eluting stents in the right coronary artery.  The patient had a stress Myoview scan February 20th demonstrating a fixed defect in the inferior wall. The gated left ventricular ejection fraction was 58%.  This study impression was "low risk."  The patient has been experiencing right-sided flank pain associated with Plavix. He tells me that if he stops taking Plavix for 2-3 days that his pain dissipates. He has not been taking it regularly because of this. The patient denies chest pain or pressure. He denies dyspnea, orthopnea, PND, edema, or palpitations.  He continues to smoke cigarettes, one half to one pack daily. We discussed this at length today.  Outpatient Encounter Prescriptions as of 01/11/2012  Medication Sig Dispense Refill  . ALPRAZolam (XANAX) 0.5 MG tablet TAKE ONE TABLET BY MOUTH THREE TIMES DAILY AS NEEDED  90 tablet  5  . amLODipine (NORVASC) 10 MG tablet Take 1 tablet (10 mg total) by mouth daily.  30 tablet  11  . aspirin 81 MG tablet Take 81 mg by mouth daily.        . clopidogrel (PLAVIX) 75 MG tablet Take 1 tablet (75 mg total) by mouth daily.  30 tablet  11  . Flaxseed, Linseed, (FLAXSEED OIL) 1000 MG CAPS Take 1 capsule by mouth daily.        . Fluticasone-Salmeterol (ADVAIR DISKUS) 250-50 MCG/DOSE AEPB Inhale 1 puff into the lungs every 12 (twelve) hours.  60 each  11  . Ibuprofen (ADVIL PO) Take by mouth as needed.        [name] lisinopril (PRINIVIL,ZESTRIL) 40 MG tablet Take 1 tablet (40 mg total) by mouth daily.  30 tablet  11  . metoprolol tartrate (LOPRESSOR) 25 MG tablet Take 2 tablets by mouth in the morning and 1 tablet by mouth at night  90 tablet  11  . mometasone (NASONEX) 50 MCG/ACT nasal spray Place 2 sprays into the nose as needed.      .  Multiple Vitamin (MULTIVITAMIN) capsule Take 1 capsule by mouth daily.        . Multiple Vitamins-Minerals (OCUVITE ADULT 50+ PO) Take 1 tablet by mouth 2 (two) times daily.        . nitroGLYCERIN (NITROSTAT) 0.4 MG SL tablet Place 0.4 mg under the tongue every 5 (five) minutes as needed.        . Omega-3 Fatty Acids (FISH OIL) 1200 MG CAPS Take 1 capsule by mouth daily.        . ranitidine (ZANTAC) 300 MG capsule Take 300 mg by mouth as needed.      . simvastatin (ZOCOR) 20 MG tablet Take 1 tablet (20 mg total) by mouth at bedtime.  30 tablet  11  . DISCONTD: mometasone (NASONEX) 50 MCG/ACT nasal spray 2 sprays by Nasal route 2 (two) times daily.  17 g  11  . DISCONTD: ranitidine (ZANTAC) 300 MG capsule Take 1 capsule (300 mg total) by mouth every evening.  30 capsule  11    Allergies  Allergen Reactions  . Guiatuss Dm     REACTION: GI upset    Past Medical History  Diagnosis Date  . History of sinusitis   . Epistaxis   . COPD (chronic obstructive pulmonary disease)   . Cigarette smoker   . Hypertension   .  CAD (coronary artery disease)   . Hypercholesteremia   . Borderline diabetes mellitus   . Obesity   . History of colonic polyps   . DJD (degenerative joint disease)   . Anxiety     ROS: Negative except as per HPI  BP 128/78  Pulse 76  Ht 5\' 7"  (1.702 m)  Wt 93.949 kg (207 lb 1.9 oz)  BMI 32.44 kg/m2  PHYSICAL EXAM: Pt is alert and oriented, NAD HEENT: normal Neck: JVP - normal, carotids 2+= without bruits Lungs: Prolonged expiration, otherwise clear CV: RRR without murmur or gallop Abd: soft, NT, Positive BS, obese Ext: no C/C/E, distal pulses intact and equal Skin: warm/dry no rash  ASSESSMENT AND PLAN:

## 2012-01-26 DIAGNOSIS — M67919 Unspecified disorder of synovium and tendon, unspecified shoulder: Secondary | ICD-10-CM | POA: Diagnosis not present

## 2012-01-26 DIAGNOSIS — M719 Bursopathy, unspecified: Secondary | ICD-10-CM | POA: Diagnosis not present

## 2012-01-26 DIAGNOSIS — M25519 Pain in unspecified shoulder: Secondary | ICD-10-CM | POA: Diagnosis not present

## 2012-02-23 DIAGNOSIS — M25519 Pain in unspecified shoulder: Secondary | ICD-10-CM | POA: Diagnosis not present

## 2012-02-23 DIAGNOSIS — M67919 Unspecified disorder of synovium and tendon, unspecified shoulder: Secondary | ICD-10-CM | POA: Diagnosis not present

## 2012-03-08 DIAGNOSIS — Z8551 Personal history of malignant neoplasm of bladder: Secondary | ICD-10-CM | POA: Diagnosis not present

## 2012-03-08 DIAGNOSIS — D090 Carcinoma in situ of bladder: Secondary | ICD-10-CM | POA: Diagnosis not present

## 2012-04-11 ENCOUNTER — Other Ambulatory Visit: Payer: Self-pay | Admitting: Pulmonary Disease

## 2012-04-11 ENCOUNTER — Telehealth: Payer: Self-pay | Admitting: Pulmonary Disease

## 2012-04-11 MED ORDER — FLUTICASONE-SALMETEROL 250-50 MCG/DOSE IN AEPB: 1.0000 | INHALATION_SPRAY | Freq: Two times a day (BID) | RESPIRATORY_TRACT | Status: DC

## 2012-04-11 NOTE — Telephone Encounter (Signed)
Refill sent. Pt is aware. Cayleb Jarnigan, CMA  

## 2012-04-18 ENCOUNTER — Ambulatory Visit (INDEPENDENT_AMBULATORY_CARE_PROVIDER_SITE_OTHER): Payer: Medicare Other | Admitting: Pulmonary Disease

## 2012-04-18 ENCOUNTER — Ambulatory Visit (INDEPENDENT_AMBULATORY_CARE_PROVIDER_SITE_OTHER)
Admission: RE | Admit: 2012-04-18 | Discharge: 2012-04-18 | Disposition: A | Discharge status: Home / Self Care | Payer: Medicare Other | Source: Ambulatory Visit | Attending: Pulmonary Disease | Admitting: Pulmonary Disease

## 2012-04-18 ENCOUNTER — Encounter: Payer: Self-pay | Admitting: Pulmonary Disease

## 2012-04-18 ENCOUNTER — Other Ambulatory Visit (INDEPENDENT_AMBULATORY_CARE_PROVIDER_SITE_OTHER): Payer: Medicare Other

## 2012-04-18 VITALS — BP 122/64 | HR 69 | Temp 98.3°F | Ht 67.0 in | Wt 211.0 lb

## 2012-04-18 DIAGNOSIS — D126 Benign neoplasm of colon, unspecified: Secondary | ICD-10-CM

## 2012-04-18 DIAGNOSIS — E7800 Pure hypercholesterolemia, unspecified: Secondary | ICD-10-CM

## 2012-04-18 DIAGNOSIS — J449 Chronic obstructive pulmonary disease, unspecified: Secondary | ICD-10-CM | POA: Diagnosis not present

## 2012-04-18 DIAGNOSIS — K2970 Gastritis, unspecified, without bleeding: Secondary | ICD-10-CM

## 2012-04-18 DIAGNOSIS — M1990 Unspecified osteoarthritis, unspecified site: Secondary | ICD-10-CM

## 2012-04-18 DIAGNOSIS — F17200 Nicotine dependence, unspecified, uncomplicated: Secondary | ICD-10-CM

## 2012-04-18 DIAGNOSIS — J984 Other disorders of lung: Secondary | ICD-10-CM | POA: Diagnosis not present

## 2012-04-18 DIAGNOSIS — N139 Obstructive and reflux uropathy, unspecified: Secondary | ICD-10-CM | POA: Diagnosis not present

## 2012-04-18 DIAGNOSIS — I2510 Atherosclerotic heart disease of native coronary artery without angina pectoris: Secondary | ICD-10-CM | POA: Diagnosis not present

## 2012-04-18 DIAGNOSIS — F411 Generalized anxiety disorder: Secondary | ICD-10-CM

## 2012-04-18 DIAGNOSIS — I10 Essential (primary) hypertension: Secondary | ICD-10-CM

## 2012-04-18 DIAGNOSIS — R7309 Other abnormal glucose: Secondary | ICD-10-CM

## 2012-04-18 DIAGNOSIS — J45909 Unspecified asthma, uncomplicated: Secondary | ICD-10-CM | POA: Diagnosis not present

## 2012-04-18 DIAGNOSIS — J439 Emphysema, unspecified: Secondary | ICD-10-CM | POA: Diagnosis not present

## 2012-04-18 DIAGNOSIS — C679 Malignant neoplasm of bladder, unspecified: Secondary | ICD-10-CM

## 2012-04-18 LAB — HEPATIC FUNCTION PANEL
Albumin: 4.1 g/dL (ref 3.5–5.2)
Bilirubin, Direct: 0.1 mg/dL (ref 0.0–0.3)
Total Protein: 7.9 g/dL (ref 6.0–8.3)

## 2012-04-18 LAB — CBC WITH DIFFERENTIAL/PLATELET
Basophils Relative: 0.3 % (ref 0.0–3.0)
Eosinophils Relative: 2.6 % (ref 0.0–5.0)
Hemoglobin: 15.3 g/dL (ref 13.0–17.0)
Lymphocytes Relative: 21.3 % (ref 12.0–46.0)
Monocytes Relative: 9.3 % (ref 3.0–12.0)
Neutro Abs: 5.1 10*3/uL (ref 1.4–7.7)
Neutrophils Relative %: 66.5 % (ref 43.0–77.0)
RBC: 4.53 Mil/uL (ref 4.22–5.81)
WBC: 7.6 10*3/uL (ref 4.5–10.5)

## 2012-04-18 LAB — PSA: PSA: 0.5 ng/mL (ref 0.10–4.00)

## 2012-04-18 LAB — BASIC METABOLIC PANEL
BUN: 18 mg/dL (ref 6–23)
CO2: 27 mEq/L (ref 19–32)
Calcium: 9 mg/dL (ref 8.4–10.5)
Creatinine, Ser: 0.8 mg/dL (ref 0.4–1.5)

## 2012-04-18 LAB — LIPID PANEL
Cholesterol: 130 mg/dL (ref 0–200)
HDL: 32.7 mg/dL — ABNORMAL LOW (ref >39.00)
LDL Cholesterol: 77 mg/dL (ref 0–99)
Total CHOL/HDL Ratio: 4
Triglycerides: 102 mg/dL (ref 0.0–149.0)
VLDL: 20.4 mg/dL (ref 0.0–40.0)

## 2012-04-18 NOTE — Progress Notes (Signed)
Subjective:    Patient ID: [name], male    DOB: 1946/06/17, 66 y.o.   MRN: [id number]  HPI 66 y/o WM here for a follow up visit and on-going management of mult med problems...   ~  May11:  stable 60mo- but still smoking 5-10 cig/d, hasn't lost weight (actually gained 6#), not exercising etc... BP controlled- denies CP,worsening dyspnea, etc... due for CXR & fasting labs... 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 he is now sched for 6 BCG instillations... he was seen by DrBBrodie 9/11- preop eval OK for surg (hx CAD, prev IWMI & several PCIs> stable, no angina, & he tol surg well...   ~  Mar 24, 2011:  60mo ROV & doing well he says> in the interim he has had f/u w/ [name] & [name]; smoking "less"; BP controlled; weight down nicely w/ diet & exercise;  Due for f/u CXR & fasting labs> see below:  ~  October 19, 2011:  52mo ROV & doing satis he says w/o new complaints or concerns at this time... We reviewed his prev labs from 5/12...    He saw [name] 10/12 for f/u hematuria & TCCa of bladder w/ TURBT 9/11 & subseq 6wk course of BCG when f/u cysto & bx revealed CIS; his last cysto was 1/12 w/ repeat bx all neg for TCCa/ CIS; repeat cysto 10/12 was also neg x scars where prev bx were done (f/u planned 18yr)...  ~  April 18, 2012:  60mo ROV & [place] says that he is doing well & denies new complaints or concerns; he tells me he is sched for Cat surg soon...     He saw [name] 2/13 for Cards f/u> Hx CAD & IWMI 1995, hx mult PCIs, Myoview 2/13 showed fixed defect in infer wall, no ischemia, EF=58%; despite everything he continues to smoke! They decided to stop his Plavix since he wasn't taking it regularly anyway &  sustitute ASA81mg /d...    He saw [name] 4/13 for Urology f/u> hx BPH w/ mild obstructive symptoms, hx TCCa of bladder, s/p TURBT 9/11 (hi grade TCCa w/ focal invasion), then had 6wks BCG, surveillance cysto 4/13 was neg & they continue to check Q71mo...  We reviewed prob list, meds, xrays and labs> see below>>          Problem List:  Hx of SINUSITIS (ICD-473.9) - off Nasonex now & recent Rx w/ Augmentin/ Saline... notes occas sinus congestion, drainage, sl HA, etc... he knows that he needs to quit smoking! ~  10/10: Adm for epistaxis w/ eval by DrWolicki- CXR= NAD, labs OK, rec for saline lavage- nose bleed resolved & disch home (no recurrence so far).  COPD (ICD-496) - on ADVAIR 250Bid; STILL SMOKES 5-10 cig/d & he's refused Chantix...  ~  He had hemoptysis 6/99 without lesion on CXR, CTChest, or Bronchoscopy... ~  Adm 10/10 w/ epistaxsis & eval DrWolicki as noted> he wanted to do Sleep Study but pt cancelled it & declined to resched. 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 ~  CXR 5/12 showed ectatic calcif & tort Aorta, COPD w/ incr markings ar bases, NAD, +degen spondylosis. ~  CXR 6/13 showed COPD/  emphysema, scarring in the lower lobes, otherw clear lungs, DJD in spine...  HYPERTENSION (ICD-401.9) - controlled on METOPROLOL 25mg Tid,  NORVASC 10mg /d, LISINOPRIL 40mg /d...   ~  11/11:  BP=122/70, not checking BP's at home... denies HA, fatigue, visual changes, CP, palipit, dizziness, syncope, dyspnea, edema, etc. ~  5/12:  BP= 120/72, pt very pleased w/ his wt loss & improved exercise ability. ~  6/13:  BP= 122/64 & feeling well w/o CP, palpit, SOB, edema, etc...  CORONARY ARTERY DISEASE (ICD-414.00) - ASA 81mg /d & off Plavix as of 2/13 officially as he wasn't taking it regularly anyway. ~  Hx prev IWMI and PTCA/ stent in RCA by DrBrodie 12/95 & 5/96...  ~  Myoview 4/08 abnormal w/ inferior ischemia and subseq cath revealing restenoses in RCA- s/p repeat PTCA/ stents...  ~   f/u Myoview 9/08 w/ prev infer infarct, no ischemia, EF=56%... ~  9/11:  pre op cardiac eval DrBrodie> OK to hold ASA/ Plavix for bladder surg. 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 ~  2/13:  He had f/u [name] w/ Myoview showing a fixed defect in infer wall, no ischemia, EF=58%; pt wasn't taking Plavix, therefore stopped in favor of ASA81mg /d...  HYPERCHOLESTEROLEMIA (Mixed Hyperlipidemia) - prev on Simva80 but DrBrodie decr him to SIMVASTATIN 20mg /d + Fish Oil & Red Wine qd... ~  FLP 9/08 on Simva40 showed TChol 151, TG 106, HDL 23, LDL 107 ~  FLP 8/09 on Simva80 showed TChol 142, TG 135, HDL 28, LDL 87 ~  FLP 6/10 on Simva80 showed TChol 151, TG 117, HDL 31, LDL 97 ~  FLP in hosp 10/10 showed TChol 128, TG 110, HDL 26, LDL 80 ~  FLP 5/11 on Simva80 showed TChol 144, TG 215, HDL 26, LDL 89 ~  9/11:  DrBrodie decr him to Simva20... we discussed the need for better diet & wt reduction. ~  FLP 5/12 on Simva20 showed TChol 162, TG 159, HDL 32, LDL 98 ~  FLP 6/13 on Simva20 showed TChol 130, TG 102, HDL 33, LDL 77... rec to incr exercise program & get wt down!  DIABETES MELLITUS, BORDERLINE (ICD-790.29) - on diet alone... ~  labs 9/08 showed BS=100, HgA1c=6.1 ~  labs 8/09 showed BS= 107, A1c= 6.2 ~  labs 6/10 showed BS= 108, A1c= 6.2 ~  lab 5/11 (wt=244#) showed BS= 121. A1c= 6.7.[name]. he was told "get wt down, or start meds". ~  Labs 5/12 showed BS= 109, A1c= 6.1 ~  Labs 6/13 showed BS= 114, & he needs to lose the weight...  OBESITY (ICD-278.00) - weight was down as low as 202# in 2/09, and steadily incr to 244# by 5/11... ~  weight 5/11 = 244#.[name].  we reviewed diet + exercise necessary to lose weight... ~  weight 11/11 = 217# (after dx & rx for bladder cancer) ~  Weight 5/12 = 214# but says it's 192# at home... ~  Weight 6/13 = 211# but knows it's better since his pant/ belt is down to 36" (under his  roll).  GASTRITIS (ICD-535.50) - had EGD 10/05 showing gastritis... on RANITADINE 300mg /d due to his Plavix Rx, off prev Omep.  COLONIC POLYPS (ICD-211.3) - last colonoscopy by DrStark 3/04 showed 7mm polyp = hyperplastic w/ f/u planned 74yrs.[name]  BPH w/ LTOS BLADDER CANCER>  Eval by [name]> ~ Episode hematuria 7/11 w/ neg KUB/ CT scan; Cysto w/ papillary tumor  w/ TURBT 9/11 w/ hi grade transitional cell ca; 10/11 repeat cysto w/ CIS on bx, therefore f/u w/ intravesical BCG treatments, & repeat cysto 1/12 was free of malig disease... ~  4/12:  Pt reported f/u cysto was neg, doing well... ~  10/12:  Note from [name] reviewed> cysto was neg, no recurrent tumors... ~  4/13:  Note from [name] reviewed> neg f/u cysto w/o recurrent TCCa...  DEGENERATIVE JOINT DISEASE (ICD-715.90) - He has c/o right shoulder pain, hip pain, knee pain, and LBP> Rx ADVIL w/ some benefit... offered Ortho referral & he will decide.  ANXIETY (ICD-300.00) - on ALPRAZOLAM 0.5mg  Prn...   Past Surgical History  Procedure Date  . Cataract surgery/sub posterior vitrectomy in left eye 1996    Dr. Luciana Axe    Outpatient Encounter Prescriptions as of 04/18/2012  Medication Sig Dispense Refill  . ALPRAZolam (XANAX) 0.5 MG tablet TAKE ONE TABLET BY MOUTH THREE TIMES DAILY AS NEEDED  90 tablet  5  . amLODipine (NORVASC) 10 MG tablet TAKE ONE TABLET BY MOUTH EVERY DAY  30 tablet  11  . aspirin 81 MG tablet Take 81 mg by mouth daily.        . Flaxseed, Linseed, (FLAXSEED OIL) 1000 MG CAPS Take 1 capsule by mouth daily.        . Fluticasone-Salmeterol (ADVAIR DISKUS) 250-50 MCG/DOSE AEPB Inhale 1 puff into the lungs every 12 (twelve) hours.  60 each  11  . Ibuprofen (ADVIL PO) Take by mouth as needed.        [name] lisinopril (PRINIVIL,ZESTRIL) 40 MG tablet Take 1 tablet (40 mg total) by mouth daily.  30 tablet  11  . metoprolol tartrate (LOPRESSOR) 25 MG tablet TAKE TWO TABLETS BY MOUTH IN THE MORNING AND ONE AT NIGHT  90  tablet  11  . Multiple Vitamin (MULTIVITAMIN) capsule Take 1 capsule by mouth daily.        . Multiple Vitamins-Minerals (OCUVITE ADULT 50+ PO) Take 1 tablet by mouth 2 (two) times daily.        . nitroGLYCERIN (NITROSTAT) 0.4 MG SL tablet Place 0.4 mg under the tongue every 5 (five) minutes as needed.        . Omega-3 Fatty Acids (FISH OIL) 1200 MG CAPS Take 1 capsule by mouth daily.        . simvastatin (ZOCOR) 20 MG tablet TAKE ONE TABLET BY MOUTH AT BEDTIME  30 tablet  11  . DISCONTD: mometasone (NASONEX) 50 MCG/ACT nasal spray Place 2 sprays into the nose as needed.      [name] DISCONTD: ranitidine (ZANTAC) 300 MG capsule Take 300 mg by mouth as needed.        Allergies  Allergen Reactions  . Dextromethorphan-Guaifenesin     REACTION: GI upset    Current Medications, Allergies, Past Medical History, Past Surgical History, Family History, and Social History were reviewed in [employer] record.    Review of Systems        See HPI - all other systems neg except as noted... The patient complains of some dyspnea on exertion.  The patient denies anorexia, fever, weight loss, weight gain, vision loss, decreased hearing, hoarseness, chest pain, syncope, peripheral edema, prolonged cough, headaches, hemoptysis, abdominal pain, melena, hematochezia, severe indigestion/heartburn, hematuria, incontinence, muscle weakness, suspicious skin lesions, transient blindness, difficulty walking, depression, unusual weight change, abnormal bleeding, enlarged lymph nodes, and angioedema.     Objective:   Physical Exam      WD,  Obese, 66 y/o WM in NAD... Vital Signs:  Reviewed... GENERAL:  Alert & oriented; pleasant & cooperative... HEENT:  [place]/AT, EOM-wnl, PERRLA, EACs-clear, TMs-wnl, NOSE- sl congestion, THROAT-clear & wnl. NECK:  Supple w/ fairROM; no JVD; normal carotid impulses w/o bruits; no thyromegaly or nodules palpated; no lymphadenopathy. CHEST:  Clear to P & A; without  wheezes/ rales/ or rhonchi heard... HEART:  Regular Rhythm; without murmurs/ rubs/ or gallops detected... ABDOMEN:  Obese, soft, & nontender; normal bowel sounds; no organomegaly or masses detected. EXT: without deformities, mild arthritic changes; no varicose veins/ +venous insuffic/ no edema. NEURO:  CN's intact; no focal neuro deficits... DERM:  No lesions noted; no rash etc...  RADIOLOGY DATA:  Reviewed in the EPIC EMR & discussed w/ the patient...  LABORATORY DATA:  Reviewed in the EPIC EMR & discussed w/ the patient...   Assessment & Plan:   COPD>  He is still smoking & understands the importance of smoking cessation; continue Advair, Mucinex prn, wt reduction/ exercise/ etc...  HBP>  Controlled on meds + diet & exercise program...  CAD>  On ASA/ off Plavix & stable w/o angina, etc...  LIPIDS>  Improved on diet/ wt reduction & his Simva20/ FishOil/ & wine daily (states DrBrodie told him to do this)...  DM>  Labs much improved w/ wt reduction...  GI> Hx Gastritis, Polpyps>  On Ranitadine w/o symptoms & f/u colon due from DrStark in 2014...  GU>  He has BPH w/ LTOS, and TCCa of bladder, followed by [name] & pt reports surveillence cysto 4/13 was neg...   Patient's Medications  New Prescriptions   No medications on file  Previous Medications   ALPRAZOLAM (XANAX) 0.5 MG TABLET    TAKE ONE TABLET BY MOUTH THREE TIMES DAILY AS NEEDED   AMLODIPINE (NORVASC) 10 MG TABLET    TAKE ONE TABLET BY MOUTH EVERY DAY   ASPIRIN 81 MG TABLET    Take 81 mg by mouth daily.     FLAXSEED, LINSEED, (FLAXSEED OIL) 1000 MG CAPS    Take 1 capsule by mouth daily.     FLUTICASONE-SALMETEROL (ADVAIR DISKUS) 250-50 MCG/DOSE AEPB    Inhale 1 puff into the lungs every 12 (twelve) hours.   IBUPROFEN (ADVIL PO)    Take by mouth as needed.     LISINOPRIL (PRINIVIL,ZESTRIL) 40 MG TABLET    Take 1 tablet (40 mg total) by mouth daily.   METOPROLOL TARTRATE (LOPRESSOR) 25 MG TABLET    TAKE TWO TABLETS BY  MOUTH IN THE MORNING AND ONE AT NIGHT   MULTIPLE VITAMIN (MULTIVITAMIN) CAPSULE    Take 1 capsule by mouth daily.     MULTIPLE VITAMINS-MINERALS (OCUVITE ADULT 50+ PO)    Take 1 tablet by mouth 2 (two) times daily.     NITROGLYCERIN (NITROSTAT) 0.4 MG SL TABLET    Place 0.4 mg under the tongue every 5 (five) minutes as needed.     OMEGA-3 FATTY ACIDS (FISH OIL) 1200 MG CAPS    Take 1 capsule by mouth daily.     SIMVASTATIN (ZOCOR) 20 MG TABLET    TAKE ONE TABLET BY MOUTH AT BEDTIME  Modified Medications   No medications on file  Discontinued Medications   MOMETASONE (NASONEX) 50 MCG/ACT NASAL SPRAY    Place 2 sprays into the nose as needed.   RANITIDINE (ZANTAC) 300 MG CAPSULE    Take 300 mg by mouth as needed.

## 2012-04-18 NOTE — Patient Instructions (Signed)
Today we updated your med list in our EPIC system...    Continue your current medications the same...  Today we did your follow up CXR & FASTING blood work...    We will call you w/ the results when avail...  Keep up the good work w/ diet & exercise!!!    Now all you need to do is QUIT SMOKING!!!  Call for any questions...  Let's continue or 6 month follow up visits.Marland KitchenMarland Kitchen

## 2012-04-25 ENCOUNTER — Telehealth: Payer: Self-pay | Admitting: Pulmonary Disease

## 2012-04-25 NOTE — Telephone Encounter (Signed)
Spoke with pt requesting  A 90 day supply of following meds be sent to   [facility]  norvasc 10 mg  simistatin 20 mg  Lisinopril 40  Xanax0.5 Allergies  Allergen Reactions  . Dextromethorphan-Guaifenesin     REACTION: GI upset   Dr Kriste Basque is this ok to fill. Thank you

## 2012-04-25 NOTE — Telephone Encounter (Signed)
Per SN-cant do 90 day Xanax due to controlled substance- can give Xanax #90 take 1 po tid with 5 refills; otherwise okay to give Amlodipine 10mg  #90 take 1 po qd with 4 refills; Simvastatin 20mg  #90 take 1 po qhs with 4 refills; Lisinopril 40mg  #90 take 1 po qd with 4 refills. Okay to send to [facility].

## 2012-04-26 MED ORDER — LISINOPRIL 40 MG PO TABS: 40.0000 mg | ORAL_TABLET | Freq: Every day | ORAL | Status: DC

## 2012-04-26 MED ORDER — ALPRAZOLAM 0.5 MG PO TABS: 0.5000 mg | ORAL_TABLET | Freq: Three times a day (TID) | ORAL | Status: DC | PRN

## 2012-04-26 MED ORDER — AMLODIPINE BESYLATE 10 MG PO TABS: 10.0000 mg | ORAL_TABLET | Freq: Every day | ORAL | Status: DC

## 2012-04-26 MED ORDER — SIMVASTATIN 20 MG PO TABS: 20.0000 mg | ORAL_TABLET | Freq: Every day | ORAL | Status: DC

## 2012-04-26 NOTE — Telephone Encounter (Signed)
Called and spoke with pts wife and she is aware of the rx for the alprazolam has been faxed to [facility] pharmacy. Nothing further needed.

## 2012-04-26 NOTE — Telephone Encounter (Signed)
I have sent all meds to [facility] EXCEPT xanax. I ATC the pt to see if they still want it sent since it will only be for 30 day supply. I ATCx1 NA, no voicemail. WCB. Carron Curie, CMA

## 2012-06-13 DIAGNOSIS — D090 Carcinoma in situ of bladder: Secondary | ICD-10-CM | POA: Diagnosis not present

## 2012-06-13 DIAGNOSIS — Z8551 Personal history of malignant neoplasm of bladder: Secondary | ICD-10-CM | POA: Diagnosis not present

## 2012-06-14 DIAGNOSIS — H2510 Age-related nuclear cataract, unspecified eye: Secondary | ICD-10-CM | POA: Diagnosis not present

## 2012-06-15 ENCOUNTER — Other Ambulatory Visit: Payer: Self-pay | Admitting: Cardiovascular Disease

## 2012-06-15 DIAGNOSIS — R82998 Other abnormal findings in urine: Secondary | ICD-10-CM | POA: Diagnosis not present

## 2012-06-15 DIAGNOSIS — Z8551 Personal history of malignant neoplasm of bladder: Secondary | ICD-10-CM | POA: Diagnosis not present

## 2012-06-15 DIAGNOSIS — D090 Carcinoma in situ of bladder: Secondary | ICD-10-CM | POA: Diagnosis not present

## 2012-06-15 MED ORDER — NITROGLYCERIN 0.4 MG SL SUBL: 0.4000 mg | SUBLINGUAL_TABLET | SUBLINGUAL | Status: DC | PRN

## 2012-07-05 ENCOUNTER — Other Ambulatory Visit: Payer: Self-pay | Admitting: Pulmonary Disease

## 2012-07-16 HISTORY — PX: OTHER SURGICAL HISTORY: SHX169

## 2012-09-21 ENCOUNTER — Ambulatory Visit (INDEPENDENT_AMBULATORY_CARE_PROVIDER_SITE_OTHER): Payer: Medicare Other

## 2012-09-21 DIAGNOSIS — Z23 Encounter for immunization: Secondary | ICD-10-CM

## 2012-10-05 DIAGNOSIS — Z961 Presence of intraocular lens: Secondary | ICD-10-CM | POA: Diagnosis not present

## 2012-10-19 ENCOUNTER — Ambulatory Visit (INDEPENDENT_AMBULATORY_CARE_PROVIDER_SITE_OTHER): Payer: Medicare Other | Admitting: Pulmonary Disease

## 2012-10-19 ENCOUNTER — Encounter: Payer: Self-pay | Admitting: Pulmonary Disease

## 2012-10-19 VITALS — BP 122/82 | HR 70 | Temp 98.7°F | Ht 67.0 in | Wt 219.2 lb

## 2012-10-19 DIAGNOSIS — J449 Chronic obstructive pulmonary disease, unspecified: Secondary | ICD-10-CM | POA: Diagnosis not present

## 2012-10-19 DIAGNOSIS — F411 Generalized anxiety disorder: Secondary | ICD-10-CM

## 2012-10-19 DIAGNOSIS — E669 Obesity, unspecified: Secondary | ICD-10-CM

## 2012-10-19 DIAGNOSIS — E7800 Pure hypercholesterolemia, unspecified: Secondary | ICD-10-CM | POA: Diagnosis not present

## 2012-10-19 DIAGNOSIS — N139 Obstructive and reflux uropathy, unspecified: Secondary | ICD-10-CM

## 2012-10-19 DIAGNOSIS — I10 Essential (primary) hypertension: Secondary | ICD-10-CM

## 2012-10-19 DIAGNOSIS — I2510 Atherosclerotic heart disease of native coronary artery without angina pectoris: Secondary | ICD-10-CM | POA: Diagnosis not present

## 2012-10-19 DIAGNOSIS — R7309 Other abnormal glucose: Secondary | ICD-10-CM

## 2012-10-19 DIAGNOSIS — D126 Benign neoplasm of colon, unspecified: Secondary | ICD-10-CM

## 2012-10-19 DIAGNOSIS — K2990 Gastroduodenitis, unspecified, without bleeding: Secondary | ICD-10-CM

## 2012-10-19 DIAGNOSIS — M1990 Unspecified osteoarthritis, unspecified site: Secondary | ICD-10-CM

## 2012-10-19 DIAGNOSIS — K2970 Gastritis, unspecified, without bleeding: Secondary | ICD-10-CM

## 2012-10-19 DIAGNOSIS — C679 Malignant neoplasm of bladder, unspecified: Secondary | ICD-10-CM

## 2012-10-19 NOTE — Patient Instructions (Addendum)
Today we updated your med list in our EPIC system...    Continue your current medications the same...  Keep up the good work w/ smoking cessation...    Let's get on track w/ our diet & exercise program...  Call for any questions...  Let's [plan a follow up visit in 6 months w/ CXR & FASTING blood work.Marland KitchenMarland Kitchen

## 2012-10-19 NOTE — Progress Notes (Signed)
Subjective:    Patient ID: [name], male    DOB: 26-Apr-1946, 66 y.o.   MRN: [id number]  HPI 66 y/o WM here for a follow up visit and on-going management of mult med problems...   ~  Mar 24, 2011:  74mo ROV & doing well he says> in the interim he has had f/u w/ [name] & [name]; smoking "less"; BP controlled; weight down nicely w/ diet & exercise;  Due for f/u CXR & fasting labs> see below:  ~  October 19, 2011:  68mo ROV & doing satis he says w/o new complaints or concerns at this time... We reviewed his prev labs from 5/12...    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  ~  April 18, 2012:  74mo ROV & [name] says that he is doing well & denies new complaints or concerns; he tells me he is sched for Cat surg soon...     He saw [name] 2/13 for Cards f/u> Hx CAD & IWMI 1995, hx mult PCIs, Myoview 2/13 showed fixed defect in infer wall, no ischemia, EF=58%; despite everything he continues to smoke! They decided to stop his Plavix since he wasn't taking it regularly anyway & sustitute ASA81mg /d...    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 We reviewed prob list, meds, xrays and labs> see below>>  ~  October 19, 2012:  74mo ROV & [name] reports stable, proud that he has decreased his smoking further to 1/4 ppd, encouraged to quit... We reviewed the following problems during today's visit>>    COPD, smoker> on Advair250; still smoking but decr to 1/4ppd; min cough, sm amt beige phlegm w/o blood, denies SOB but sedentary x yard work...    HBP> on Metop25-3/d, Amlod10, Lisin40;  BP=122/82 & he denies CP, palpit, SOB, edema...    CAD> on ASA81; followed by [name] & follow up due; denies angina etc but too sedentary; his 35 y/o brother died recently w/ AAA rupture...    Chol> on Simva20; FLP 6/13 showed TChol 130, TG 102, HDL 33, LDL 77     DM> on diet alone; we reviewed low carb diet, exercise program, and wt reduction...    Obese> wt=219# which is up 8# despite his attempts at diet, exercise    GI- Gastritis, polyps> prev on Zantac; last colon 2004 w/ hyperplastic polyp removed & f/u due 2014...    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    DJD> on OTC analgesics as needed...    Anxiety> on Alpraz0.5mg  prn... We reviewed prob list, meds, xrays and labs> see below for updates >> he received 2013 Flu vaccine 11/13...         Problem List:  Hx of SINUSITIS (ICD-473.9) - off Nasonex now & recent Rx w/ Augmentin/ Saline... notes occas sinus congestion, drainage, sl HA, etc... he knows that he needs to quit smoking! ~  10/10: Adm for epistaxis  w/ eval by DrWolicki- CXR= NAD, labs OK, rec for saline lavage- nose bleed resolved & disch home (no recurrence so far).  COPD (ICD-496) - on ADVAIR 250Bid; STILL SMOKES 5-10 cig/d & he's refused Chantix...  ~  He had hemoptysis 6/99 without lesion on CXR, CTChest, or Bronchoscopy... ~  Adm 10/10 w/ epistaxsis & eval DrWolicki as noted> he wanted to do Sleep Study but pt cancelled it & declined to resched. 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 ~  CXR 5/12 showed ectatic calcif & tort Aorta, COPD w/ incr markings ar bases, NAD, +degen spondylosis. ~  CXR 6/13 showed COPD/ emphysema, scarring in the lower lobes, otherw clear lungs, DJD in spine...  HYPERTENSION (ICD-401.9) - controlled on METOPROLOL 25mg Tid,  NORVASC 10mg /d, LISINOPRIL 40mg /d...   ~  11/11:  BP=122/70, not checking BP's at home... denies HA, fatigue, visual changes,  CP, palipit, dizziness, syncope, dyspnea, edema, etc. ~  5/12:  BP= 120/72, pt very pleased w/ his wt loss & improved exercise ability. ~  6/13:  BP= 122/64 & feeling well w/o CP, palpit, SOB, edema, etc... ~  12/13:  BP=122/82 & he denies CP, palpit, SOB, edema...  CORONARY ARTERY DISEASE (ICD-414.00) - ASA 81mg /d & off Plavix as of 2/13 officially as he wasn't taking it regularly anyway. ~  Hx prev IWMI and PTCA/ stent in RCA by DrBrodie 12/95 & 5/96...  ~  Myoview 4/08 abnormal w/ inferior ischemia and subseq cath revealing restenoses in RCA- s/p repeat PTCA/ stents...  ~  f/u Myoview 9/08 w/ prev infer infarct, no ischemia, EF=56%... ~  9/11:  pre op cardiac eval DrBrodie> OK to hold ASA/ Plavix for bladder surg. 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 ~  2/13:  He had f/u [name] w/ Myoview showing a fixed defect in infer wall, no ischemia, EF=58%; pt wasn't taking Plavix, therefore stopped in favor of ASA81mg /d...  HYPERCHOLESTEROLEMIA (Mixed Hyperlipidemia) - prev on Simva80 but DrBrodie decr him to SIMVASTATIN 20mg /d + Fish Oil & Red Wine qd... ~  FLP 9/08 on Simva40 showed TChol 151, TG 106, HDL 23, LDL 107 ~  FLP 8/09 on Simva80 showed TChol 142, TG 135, HDL 28, LDL 87 ~  FLP 6/10 on Simva80 showed TChol 151, TG 117, HDL 31, LDL 97 ~  FLP in hosp 10/10 showed TChol 128, TG 110, HDL 26, LDL 80 ~  FLP 5/11 on Simva80 showed TChol 144, TG 215, HDL 26, LDL 89 ~  9/11:  DrBrodie decr him to Simva20... we discussed the need for better diet & wt reduction. ~  FLP 5/12 on Simva20 showed TChol 162, TG 159, HDL 32, LDL 98 ~  FLP 6/13 on Simva20 showed TChol 130, TG 102, HDL 33, LDL 77... rec to incr exercise program & get wt down!  DIABETES MELLITUS, BORDERLINE (ICD-790.29) - on diet alone... ~  labs 9/08 showed BS=100, HgA1c=6.1 ~  labs 8/09 showed BS= 107, A1c= 6.2 ~  labs 6/10 showed BS= 108, A1c= 6.2 ~   lab 5/11 (wt=244#) showed BS= 121. A1c= 6.7.[name]. he was told "get wt down, or start meds". ~  Labs 5/12 showed BS= 109, A1c= 6.1 ~  Labs 6/13 showed BS= 114, & he needs to lose the weight...  OBESITY (ICD-278.00) - weight was down as low as 202# in 2/09, and steadily incr to 244# by 5/11... ~  weight 5/11 = 244#.[name].  we reviewed diet + exercise necessary to lose weight... ~  weight 11/11 = 217# (after dx & rx for bladder cancer) ~  Weight 5/12 = 214# but says it's 192# at home... ~  Weight 6/13 = 211# but knows it's better since his pant/ belt is down to 36" (under his roll).  GASTRITIS (ICD-535.50) - had EGD 10/05 showing gastritis... on RANITADINE 300mg /d due to his Plavix Rx, off prev Omep.  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 ~  4/12:  Pt reported f/u cysto was neg, doing well... ~  10/12:  Note from [name] reviewed> cysto was neg, no recurrent tumors... ~  4/13:  Note from [name] reviewed> neg f/u cysto w/o recurrent TCCa...  DEGENERATIVE JOINT DISEASE (ICD-715.90) - He has c/o right shoulder pain, hip pain, knee pain, and LBP> Rx ADVIL w/ some benefit... offered Ortho referral & he will decide.  ANXIETY (ICD-300.00) - on ALPRAZOLAM 0.5mg  Prn...   Past Surgical History  Procedure Date  . Cataract surgery/sub posterior vitrectomy in left eye 1996    Dr. Luciana Axe  . Cataract surgery septemeber 2013    Outpatient Encounter Prescriptions as of 10/19/2012  Medication Sig Dispense Refill  . ALPRAZolam (XANAX) 0.5 MG tablet TAKE ONE TABLET BY MOUTH THREE TIMES DAILY AS NEEDED  90 tablet  5  . amLODipine (NORVASC)  10 MG tablet Take 1 tablet (10 mg total) by mouth daily.  90 tablet  4  . aspirin 81 MG tablet Take 81 mg by mouth daily.        . Flaxseed, Linseed, (FLAXSEED OIL) 1000 MG CAPS Take 1 capsule by mouth daily.        . Fluticasone-Salmeterol (ADVAIR DISKUS) 250-50 MCG/DOSE AEPB Inhale 1 puff into the lungs every 12 (twelve) hours.  60 each  11  . Ibuprofen (ADVIL PO) Take by mouth as needed.        [name] lisinopril (PRINIVIL,ZESTRIL) 40 MG tablet Take 1 tablet (40 mg total) by mouth daily.  90 tablet  4  . metoprolol tartrate (LOPRESSOR) 25 MG tablet TAKE TWO TABLETS BY MOUTH IN THE MORNING AND ONE AT NIGHT  90 tablet  11  . Multiple Vitamin (MULTIVITAMIN) capsule Take 1 capsule by mouth daily.        . Multiple Vitamins-Minerals (OCUVITE ADULT 50+ PO) Take 1 tablet by mouth 2 (two) times daily.        . nitroGLYCERIN (NITROSTAT) 0.4 MG SL tablet Place 1 tablet (0.4 mg total) under the tongue every 5 (five) minutes as needed.  25 tablet  11  . Omega-3 Fatty Acids (FISH OIL) 1200 MG CAPS Take 1 capsule by mouth daily.        . simvastatin (ZOCOR) 20 MG tablet Take 1 tablet (20 mg total) by mouth at bedtime.  90 tablet  4    Allergies  Allergen Reactions  . Dextromethorphan-Guaifenesin     REACTION: GI upset    Current Medications, Allergies, Past Medical History, Past Surgical History, Family History, and Social History were reviewed in [employer] record.  Review of Systems        See HPI - all other systems neg except as noted... The patient complains of some dyspnea on exertion.  The patient denies anorexia, fever, weight loss, weight gain, vision loss, decreased hearing, hoarseness, chest pain, syncope, peripheral edema, prolonged cough, headaches, hemoptysis, abdominal pain, melena, hematochezia, severe indigestion/heartburn, hematuria, incontinence, muscle weakness, suspicious skin lesions, transient blindness, difficulty walking, depression, unusual weight change,  abnormal bleeding, enlarged lymph nodes, and angioedema.     Objective:   Physical Exam      WD, Obese, 66 y/o WM in NAD... Vital Signs:  Reviewed... GENERAL:  Alert & oriented; pleasant & cooperative... HEENT:  Golden/AT, EOM-wnl, PERRLA, EACs-clear, TMs-wnl, NOSE- sl congestion, THROAT-clear & wnl. NECK:  Supple w/ fairROM; no JVD; normal carotid impulses w/o bruits; no thyromegaly or nodules palpated; no lymphadenopathy. CHEST:  Clear to P & A; without wheezes/ rales/ or rhonchi heard... HEART:  Regular Rhythm; without murmurs/ rubs/ or gallops detected... ABDOMEN:  Obese, soft, & nontender; normal bowel sounds; no organomegaly or masses detected. EXT: without deformities, mild arthritic changes; no varicose veins/ +venous insuffic/ no edema. NEURO:  CN's intact; no focal neuro deficits... DERM:  No lesions noted; no rash etc...  RADIOLOGY DATA:  Reviewed in the EPIC EMR & discussed w/ the patient...  LABORATORY DATA:  Reviewed in the EPIC EMR & discussed w/ the patient...   Assessment & Plan:    COPD>  He is still smoking & understands the importance of smoking cessation; continue Advair, Mucinex prn, wt reduction/ exercise/ etc...  HBP>  Controlled on meds + diet & exercise program...  CAD>  On ASA/ off Plavix & stable w/o angina, etc...  LIPIDS>  Improved on diet/ wt reduction & his Simva20/ FishOil/ & wine daily (states DrBrodie told him to do this)...  DM>  Labs much improved w/ wt reduction...  GI> Hx Gastritis, Polpyps>  On Ranitadine w/o symptoms & f/u colon due from DrStark in 2014...  GU>  He has BPH w/ LTOS, and TCCa of bladder, followed by [name] & pt reports surveillence cysto 4/13 was neg...   Patient's Medications  New Prescriptions   No medications on file  Previous Medications   ALPRAZOLAM (XANAX) 0.5 MG TABLET    TAKE ONE TABLET BY MOUTH THREE TIMES DAILY AS NEEDED   AMLODIPINE (NORVASC) 10 MG TABLET    Take 1 tablet (10 mg total) by mouth daily.    ASPIRIN 81 MG TABLET    Take 81 mg by mouth daily.     FLAXSEED, LINSEED, (FLAXSEED OIL) 1000 MG CAPS    Take 1 capsule by mouth daily.     FLUTICASONE-SALMETEROL (ADVAIR DISKUS) 250-50 MCG/DOSE AEPB    Inhale 1 puff into the lungs every 12 (twelve) hours.   IBUPROFEN (ADVIL PO)    Take by mouth as needed.     LISINOPRIL (PRINIVIL,ZESTRIL) 40 MG TABLET    Take 1 tablet (40 mg total) by mouth daily.   METOPROLOL TARTRATE (LOPRESSOR) 25 MG TABLET    TAKE TWO TABLETS BY MOUTH IN THE MORNING AND ONE AT NIGHT   MULTIPLE VITAMIN (MULTIVITAMIN) CAPSULE    Take 1 capsule by mouth daily.     MULTIPLE VITAMINS-MINERALS (OCUVITE ADULT 50+ PO)    Take 1 tablet by mouth 2 (two) times daily.     NITROGLYCERIN (NITROSTAT) 0.4 MG SL TABLET    Place 1 tablet (0.4 mg total) under the tongue every 5 (five) minutes as needed.  OMEGA-3 FATTY ACIDS (FISH OIL) 1200 MG CAPS    Take 1 capsule by mouth daily.     SIMVASTATIN (ZOCOR) 20 MG TABLET    Take 1 tablet (20 mg total) by mouth at bedtime.  Modified Medications   No medications on file  Discontinued Medications   No medications on file

## 2012-12-12 DIAGNOSIS — Z8551 Personal history of malignant neoplasm of bladder: Secondary | ICD-10-CM | POA: Diagnosis not present

## 2012-12-12 DIAGNOSIS — D090 Carcinoma in situ of bladder: Secondary | ICD-10-CM | POA: Diagnosis not present

## 2012-12-29 ENCOUNTER — Other Ambulatory Visit: Payer: Self-pay | Admitting: Pulmonary Disease

## 2013-01-25 ENCOUNTER — Ambulatory Visit (INDEPENDENT_AMBULATORY_CARE_PROVIDER_SITE_OTHER): Payer: Medicare Other | Admitting: Cardiovascular Disease

## 2013-01-25 ENCOUNTER — Encounter: Payer: Self-pay | Admitting: Cardiovascular Disease

## 2013-01-25 VITALS — BP 124/72 | HR 65 | Ht 67.0 in | Wt 221.8 lb

## 2013-01-25 DIAGNOSIS — I2510 Atherosclerotic heart disease of native coronary artery without angina pectoris: Secondary | ICD-10-CM

## 2013-01-25 NOTE — Patient Instructions (Addendum)
Your physician wants you to follow-up in: 12 months.  You will receive a reminder letter in the mail two months in advance. If you don't receive a letter, please call our office to schedule the follow-up appointment.  Your physician recommends that you continue on your current medications as directed. Please refer to the Current Medication list given to you today.   

## 2013-01-25 NOTE — Progress Notes (Signed)
HPI:  67 year old gentleman presenting for followup of coronary artery disease. He had a remote inferior MI in 1995. He has undergone multiple PCI procedures, most recently in 2009 when he was treated with overlapping drug-eluting stents in the right coronary artery. His last nuclear stress test in 2013 was low risk.  He has been very sedentary over the winter. He was out using a chainsaw the past week because of all the trees that are down around his property. He had to rest frequently. He did not have chest pain or pressure. He has shortness of breath with exertion as well as fatigue. He continues to smoke cigarettes. He denies leg swelling, orthopnea, PND, palpitations, lightheadedness, or syncope.  Outpatient Encounter Prescriptions as of 01/25/2013  Medication Sig Dispense Refill  . ALPRAZolam (XANAX) 0.5 MG tablet TAKE ONE TABLET BY MOUTH THREE TIMES DAILY AS NEEDED  90 tablet  4  . amLODipine (NORVASC) 10 MG tablet Take 1 tablet (10 mg total) by mouth daily.  90 tablet  4  . aspirin 81 MG tablet Take 81 mg by mouth daily.        . Flaxseed, Linseed, (FLAXSEED OIL) 1000 MG CAPS Take 1 capsule by mouth daily.        . Fluticasone-Salmeterol (ADVAIR DISKUS) 250-50 MCG/DOSE AEPB Inhale 1 puff into the lungs every 12 (twelve) hours.  60 each  11  . Ibuprofen (ADVIL PO) Take by mouth as needed.        [name] lisinopril (PRINIVIL,ZESTRIL) 40 MG tablet Take 1 tablet (40 mg total) by mouth daily.  90 tablet  4  . metoprolol tartrate (LOPRESSOR) 25 MG tablet TAKE TWO TABLETS BY MOUTH IN THE MORNING AND ONE AT NIGHT  90 tablet  11  . Multiple Vitamin (MULTIVITAMIN) capsule Take 1 capsule by mouth daily.        . Multiple Vitamins-Minerals (OCUVITE ADULT 50+ PO) Take 1 tablet by mouth 2 (two) times daily.        . nitroGLYCERIN (NITROSTAT) 0.4 MG SL tablet Place 1 tablet (0.4 mg total) under the tongue every 5 (five) minutes as needed.  25 tablet  11  . Omega-3 Fatty Acids (FISH OIL) 1200 MG CAPS Take 1  capsule by mouth daily.        . simvastatin (ZOCOR) 20 MG tablet Take 1 tablet (20 mg total) by mouth at bedtime.  90 tablet  4   No facility-administered encounter medications on file as of 01/25/2013.    Allergies  Allergen Reactions  . Dextromethorphan-Guaifenesin     REACTION: GI upset    Past Medical History  Diagnosis Date  . History of sinusitis   . Epistaxis   . COPD (chronic obstructive pulmonary disease)   . Cigarette smoker   . Hypertension   . CAD (coronary artery disease)   . Hypercholesteremia   . Borderline diabetes mellitus   . Obesity   . History of colonic polyps   . DJD (degenerative joint disease)   . Anxiety     ROS: Negative except as per HPI  BP 124/72  Pulse 65  Ht 5\' 7"  (1.702 m)  Wt 100.608 kg (221 lb 12.8 oz)  BMI 34.73 kg/m2  PHYSICAL EXAM: Pt is alert and oriented, pleasant obese male in NAD HEENT: normal Neck: JVP - normal, carotids 2+= without bruits Lungs: CTA bilaterally CV: RRR without murmur or gallop Abd: soft, NT, obese Ext: no C/C/E, distal pulses intact and equal Skin: warm/dry no rash  EKG:  Normal sinus rhythm 65 beats per minute, within normal limits.  ASSESSMENT AND PLAN: 1. Coronary atherosclerosis, native vessel. No clear cut symptoms of angina. I suspect his fatigue and exertional intolerance or related to obesity and continued cigarette smoking. He is on appropriate medical therapy. We had extensive discussion today about necessary lifestyle changes. Specifically he was counseled regarding tobacco cessation, weight loss, and initiation of an exercise program. He will continue his current medical regimen and I would like to see him back in 12 months.  2. Hypertension. Blood pressure is well controlled on a combination of amlodipine, lisinopril, and metoprolol.  3. Hyperlipidemia. His cholesterol is managed by Dr. Kriste Basque. He is on simvastatin 20 mg daily.  Tonny Bollman 01/25/2013 11:15 AM

## 2013-04-18 DIAGNOSIS — L03019 Cellulitis of unspecified finger: Secondary | ICD-10-CM | POA: Diagnosis not present

## 2013-04-19 DIAGNOSIS — L03019 Cellulitis of unspecified finger: Secondary | ICD-10-CM | POA: Diagnosis not present

## 2013-04-20 ENCOUNTER — Ambulatory Visit: Payer: Medicare Other | Admitting: Pulmonary Disease

## 2013-04-23 ENCOUNTER — Ambulatory Visit: Payer: Medicare Other | Admitting: Pulmonary Disease

## 2013-04-23 DIAGNOSIS — L03019 Cellulitis of unspecified finger: Secondary | ICD-10-CM | POA: Diagnosis not present

## 2013-04-30 ENCOUNTER — Other Ambulatory Visit: Payer: Self-pay | Admitting: Pulmonary Disease

## 2013-05-01 ENCOUNTER — Other Ambulatory Visit: Payer: Self-pay | Admitting: Pulmonary Disease

## 2013-05-02 ENCOUNTER — Telehealth: Payer: Self-pay | Admitting: Pulmonary Disease

## 2013-05-02 MED ORDER — FLUTICASONE-SALMETEROL 250-50 MCG/DOSE IN AEPB: 1.0000 | INHALATION_SPRAY | Freq: Two times a day (BID) | RESPIRATORY_TRACT | Status: DC

## 2013-05-02 NOTE — Telephone Encounter (Signed)
Called and spoke with pt and he is aware that other meds have already been sent in to the pharmacy.  Pt stated that the advair was sent to [facility] and this should have gone to [facility].  i called [facility] and they have cancelled this rx for this pt and i have resent this to [facility]. Nothing further is needed.

## 2013-05-02 NOTE — Telephone Encounter (Signed)
Pt called and states that his rx for advair was sent to [facility] but should be sent to [facility]. Pt ph# [phone]. [name]

## 2013-05-02 NOTE — Telephone Encounter (Signed)
[name] has been sent in to last until his appointment in 05/2013. Pt is aware. He stated something about [name] was supposed to send over requests for his other medications.  Leigh - have you seen anything on this pt from [facility]?

## 2013-05-07 DIAGNOSIS — L03019 Cellulitis of unspecified finger: Secondary | ICD-10-CM | POA: Diagnosis not present

## 2013-06-05 ENCOUNTER — Ambulatory Visit (INDEPENDENT_AMBULATORY_CARE_PROVIDER_SITE_OTHER)
Admission: RE | Admit: 2013-06-05 | Discharge: 2013-06-05 | Disposition: A | Discharge status: Home / Self Care | Payer: Medicare Other | Source: Ambulatory Visit | Attending: Pulmonary Disease | Admitting: Pulmonary Disease

## 2013-06-05 ENCOUNTER — Ambulatory Visit (INDEPENDENT_AMBULATORY_CARE_PROVIDER_SITE_OTHER): Payer: Medicare Other | Admitting: Pulmonary Disease

## 2013-06-05 ENCOUNTER — Encounter: Payer: Self-pay | Admitting: Pulmonary Disease

## 2013-06-05 ENCOUNTER — Encounter: Payer: Self-pay | Admitting: Gastroenterology

## 2013-06-05 ENCOUNTER — Other Ambulatory Visit (INDEPENDENT_AMBULATORY_CARE_PROVIDER_SITE_OTHER): Payer: Medicare Other

## 2013-06-05 VITALS — BP 130/78 | HR 70 | Temp 98.8°F | Ht 67.0 in | Wt 230.0 lb

## 2013-06-05 DIAGNOSIS — C679 Malignant neoplasm of bladder, unspecified: Secondary | ICD-10-CM

## 2013-06-05 DIAGNOSIS — J449 Chronic obstructive pulmonary disease, unspecified: Secondary | ICD-10-CM

## 2013-06-05 DIAGNOSIS — D126 Benign neoplasm of colon, unspecified: Secondary | ICD-10-CM

## 2013-06-05 DIAGNOSIS — I10 Essential (primary) hypertension: Secondary | ICD-10-CM | POA: Diagnosis not present

## 2013-06-05 DIAGNOSIS — N320 Bladder-neck obstruction: Secondary | ICD-10-CM | POA: Diagnosis not present

## 2013-06-05 DIAGNOSIS — R7309 Other abnormal glucose: Secondary | ICD-10-CM | POA: Diagnosis not present

## 2013-06-05 DIAGNOSIS — K2990 Gastroduodenitis, unspecified, without bleeding: Secondary | ICD-10-CM

## 2013-06-05 DIAGNOSIS — E7800 Pure hypercholesterolemia, unspecified: Secondary | ICD-10-CM | POA: Diagnosis not present

## 2013-06-05 DIAGNOSIS — K2970 Gastritis, unspecified, without bleeding: Secondary | ICD-10-CM

## 2013-06-05 DIAGNOSIS — M1990 Unspecified osteoarthritis, unspecified site: Secondary | ICD-10-CM

## 2013-06-05 DIAGNOSIS — E669 Obesity, unspecified: Secondary | ICD-10-CM

## 2013-06-05 DIAGNOSIS — N139 Obstructive and reflux uropathy, unspecified: Secondary | ICD-10-CM

## 2013-06-05 DIAGNOSIS — I2510 Atherosclerotic heart disease of native coronary artery without angina pectoris: Secondary | ICD-10-CM

## 2013-06-05 DIAGNOSIS — F411 Generalized anxiety disorder: Secondary | ICD-10-CM | POA: Diagnosis not present

## 2013-06-05 LAB — CBC WITH DIFFERENTIAL/PLATELET
Basophils Absolute: 0 10*3/uL (ref 0.0–0.1)
HCT: 44.6 % (ref 39.0–52.0)
Lymphs Abs: 1.8 10*3/uL (ref 0.7–4.0)
MCV: 100.3 fl — ABNORMAL HIGH (ref 78.0–100.0)
Monocytes Absolute: 0.5 10*3/uL (ref 0.1–1.0)
Monocytes Relative: 7.4 % (ref 3.0–12.0)
Neutrophils Relative %: 64.6 % (ref 43.0–77.0)
Platelets: 210 10*3/uL (ref 150.0–400.0)
RDW: 14.1 % (ref 11.5–14.6)

## 2013-06-05 LAB — HEPATIC FUNCTION PANEL
ALT: 37 U/L (ref 0–53)
AST: 30 U/L (ref 0–37)
Albumin: 4.5 g/dL (ref 3.5–5.2)
Alkaline Phosphatase: 41 U/L (ref 39–117)

## 2013-06-05 LAB — BASIC METABOLIC PANEL
Calcium: 9.9 mg/dL (ref 8.4–10.5)
GFR: 96.54 mL/min (ref >60.00)
Potassium: 5 mEq/L (ref 3.5–5.1)
Sodium: 141 mEq/L (ref 135–145)

## 2013-06-05 LAB — TSH: TSH: 1.26 u[IU]/mL (ref 0.35–5.50)

## 2013-06-05 LAB — LIPID PANEL
HDL: 32.7 mg/dL — ABNORMAL LOW (ref >39.00)
LDL Cholesterol: 96 mg/dL (ref 0–99)
VLDL: 29 mg/dL (ref 0.0–40.0)

## 2013-06-05 MED ORDER — ALPRAZOLAM 0.5 MG PO TABS: ORAL_TABLET | ORAL | Status: DC

## 2013-06-05 MED ORDER — IBUPROFEN 600 MG PO TABS: 600.0000 mg | ORAL_TABLET | Freq: Every day | ORAL | Status: DC | PRN

## 2013-06-05 MED ORDER — SIMVASTATIN 20 MG PO TABS: ORAL_TABLET | ORAL | Status: DC

## 2013-06-05 NOTE — Patient Instructions (Addendum)
Today we updated your med list in our EPIC system...    Continue your current medications the same...  Today we did your follow up CXR 7 FASTING blood work...    We will contact you w/ the results when available...   Let's get on track w/ our diet & exercise program...  Call for any questions...  Let's plan a follow up visit in 58mo, sooner if needed for problems.Marland KitchenMarland Kitchen

## 2013-06-05 NOTE — Progress Notes (Signed)
Subjective:    Patient ID: [name], male    DOB: 1946/03/27, 67 y.o.   MRN: [id number]  HPI 67 y/o WM here for a follow up visit and on-going management of mult med problems...   ~  Mar 24, 2011:  81mo ROV & doing well he says> in the interim he has had f/u w/ [name] & [name]; smoking "less"; BP controlled; weight down nicely w/ diet & exercise;  Due for f/u CXR & fasting labs> see below:  ~  October 19, 2011:  21mo ROV & doing satis he says w/o new complaints or concerns at this time... We reviewed his prev labs from 5/12...    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  ~  April 18, 2012:  81mo ROV & [name] says that he is doing well & denies new complaints or concerns; he tells me he is sched for Cat surg soon...     He saw [name] 2/13 for Cards f/u> Hx CAD & IWMI 1995, hx mult PCIs, Myoview 2/13 showed fixed defect in infer wall, no ischemia, EF=58%; despite everything he continues to smoke! They decided to stop his Plavix since he wasn't taking it regularly anyway & sustitute ASA81mg /d...    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 We reviewed prob list, meds, xrays and labs> see below>>  ~  October 19, 2012:  81mo ROV & [name] reports stable, proud that he has decreased his smoking further to 1/4 ppd, encouraged to quit... We reviewed the following problems during today's visit>>    COPD, smoker> on Advair250; still smoking but decr to 1/4ppd; min cough, sm amt beige phlegm w/o blood, denies SOB but sedentary x yard work...    HBP> on Metop25-3/d, Amlod10, Lisin40;  BP=122/82 & he denies CP, palpit, SOB, edema...    CAD> on ASA81; followed by [name] & follow up due; denies angina etc but too sedentary; his 83 y/o brother died recently w/ AAA rupture...    Chol> on Simva20; FLP 6/13 showed TChol 130, TG 102, HDL 33, LDL 77     DM> on diet alone; we reviewed low carb diet, exercise program, and wt reduction...    Obese> wt=219# which is up 8# despite his attempts at diet, exercise    GI- Gastritis, polyps> prev on Zantac; last colon 2004 w/ hyperplastic polyp removed & f/u due 2014...    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    DJD> on OTC analgesics as needed...    Anxiety> on Alpraz0.5mg  prn... We reviewed prob list, meds, xrays and labs> see below for updates >> he received 2013 Flu vaccine 11/13...  ~  June 05, 2013:  21mo ROV and [name] has gained 11# having stopped smoking in the interval, states his last cig was 6/22- now we have to work on his weight;  We reviewed the following medical problems during today's office visit >>  COPD, ex-smoker> on Advair250; states he quit smoking 6/14; min cough, sm amt beige phlegm w/o blood, denies SOB but sedentary x yard work...    HBP> on Metop25-3/d, Amlod10, Lisin40; BP=130/78 & he denies CP, palpit, SOB, edema...    CAD> on ASA81; followed by [name] & seen 3/14> CAD, IWMI 1995, mult PCIs, last Myoview 2013 was low risk; too sedentary, no changes made; 36 y/o brother died recently w/ AAA rupture...    Chol> on Simva20; FLP 7/14 showed TChol 158, TG 145, HDL 33, LDL 96; needs better diet & wt reduction or incr dose next time...     DM> on diet alone; LABS 7/14 showed BS= 120, A1c= 6.7; we reviewed low carb diet, exercise program, and wt reduction...    Obese> wt=230# which is up 11# despite his attempts at diet, exercise!    GI- Gastritis, polyps> prev on Zantac; last colon 2004 w/ hyperplastic polyp removed & f/u due  2014...    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    DJD> on OTC analgesics as needed...    Anxiety> on Alpraz0.5mg  prn... We reviewed prob list, meds, xrays and labs> see below for updates >>  CXR 7/14 showed COPD/ emphysema, incr markings and scarring at the bases, NAD... LABS 7/14:  FLP- ok on Simva20;  Chems- ok x BS=120 A1c=6.7;  CBC- wnl;  TSH=1.26;  PSA=0.41...           Problem List:  Hx of SINUSITIS (ICD-473.9) - off Nasonex now & recent Rx w/ Augmentin/ Saline... notes occas sinus congestion, drainage, sl HA, etc... he knows that he needs to quit smoking! ~  10/10: Adm for epistaxis w/ eval by DrWolicki- CXR= NAD, labs OK, rec for saline lavage- nose bleed resolved & disch home (no recurrence so far).  COPD (ICD-496) - on ADVAIR 250Bid; STILL SMOKES 5-10 cig/d & he's refused Chantix...  ~  He had hemoptysis 6/99 without lesion on CXR, CTChest, or Bronchoscopy... ~  Adm 10/10 w/ epistaxsis & eval DrWolicki as noted> he wanted to do Sleep Study but pt cancelled it & declined to resched. 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 ~  CXR 5/12 showed ectatic calcif & tort Aorta, COPD w/ incr markings ar bases, NAD, +degen spondylosis. ~  CXR 6/13 showed COPD/ emphysema, scarring in the lower lobes, otherw clear lungs, DJD in spine... ~  CXR 7/14 showed COPD/ emphysema, incr markings and scarring at the bases, NAD.  HYPERTENSION (ICD-401.9) - controlled on METOPROLOL 25mg Tid,  NORVASC 10mg /d, LISINOPRIL 40mg /d...   ~  11/11:  BP=122/70, not checking BP's at home... denies HA, fatigue, visual changes, CP, palipit, dizziness, syncope, dyspnea, edema, etc. ~  5/12:  BP= 120/72, pt very pleased w/ his wt loss & improved exercise ability. ~  6/13:  BP= 122/64 & feeling well w/o CP, palpit, SOB, edema, etc... ~  12/13:  BP=122/82 & he denies CP, palpit, SOB, edema... ~  7/14: on  Metop25-3/d, Amlod10, Lisin40; BP=130/78 & he denies CP, palpit, SOB, edema  CORONARY ARTERY DISEASE (ICD-414.00) - ASA 81mg /d & off Plavix as of 2/13 officially as he wasn't taking it regularly anyway. ~  Hx prev IWMI and PTCA/ stent in RCA by DrBrodie 12/95 & 5/96...  ~  Myoview 4/08 abnormal w/ inferior ischemia and subseq cath revealing restenoses in RCA- s/p repeat PTCA/ stents...  ~  f/u  Myoview 9/08 w/ prev infer infarct, no ischemia, EF=56%... ~  9/11:  pre op cardiac eval DrBrodie> OK to hold ASA/ Plavix for bladder surg. 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 ~  2/13:  He had f/u [name] w/ Myoview showing a fixed defect in infer wall, no ischemia, EF=58%; pt wasn't taking Plavix, therefore stopped in favor of ASA81mg /d... ~  3/14:  He had f/u [name]> CAD, IWMI 1995, mult PCIs, last Myoview 2013 was low risk; too sedentary, no changes made; 37 y/o brother died recently w/ AAA rupture... ~  EKG 3/14 showed NSR, rate65, WNL, NAD...  HYPERCHOLESTEROLEMIA (Mixed Hyperlipidemia) - prev on Simva80 but DrBrodie decr him to SIMVASTATIN 20mg /d + Fish Oil & Red Wine qd... ~  FLP 9/08 on Simva40 showed TChol 151, TG 106, HDL 23, LDL 107 ~  FLP 8/09 on Simva80 showed TChol 142, TG 135, HDL 28, LDL 87 ~  FLP 6/10 on Simva80 showed TChol 151, TG 117, HDL 31, LDL 97 ~  FLP in hosp 10/10 showed TChol 128, TG 110, HDL 26, LDL 80 ~  FLP 5/11 on Simva80 showed TChol 144, TG 215, HDL 26, LDL 89 ~  9/11:  DrBrodie decr him to Simva20... we discussed the need for better diet & wt reduction. ~  FLP 5/12 on Simva20 showed TChol 162, TG 159, HDL 32, LDL 98 ~  FLP 6/13 on Simva20 showed TChol 130, TG 102, HDL 33, LDL 77... rec to incr exercise program & get wt down! ~  FLP 7/14 on Simva20 showed TChol 158, TG 145, HDL 33, LDL 96... He has gained way too much wt 7 must get on diet!  DIABETES MELLITUS, BORDERLINE (ICD-790.29)  - on diet alone... ~  labs 9/08 showed BS=100, HgA1c=6.1 ~  labs 8/09 showed BS= 107, A1c= 6.2 ~  labs 6/10 showed BS= 108, A1c= 6.2 ~  lab 5/11 (wt=244#) showed BS= 121. A1c= 6.7.[name]. he was told "get wt down, or start meds". ~  Labs 5/12 showed BS= 109, A1c= 6.1 ~  Labs 6/13 showed BS= 114, & he needs to lose the weight... ~  Labs 7/14 on diet alone showed BS= 120, A1c= 6.7  OBESITY (ICD-278.00) - weight was down as low as 202# in 2/09, and steadily incr to 244# by 5/11... ~  weight 5/11 = 244#.[name].  we reviewed diet + exercise necessary to lose weight... ~  weight 11/11 = 217# (after dx & rx for bladder cancer) ~  Weight 5/12 = 214# but says it's 192# at home... ~  Weight 6/13 = 211# but knows it's better since his pant/ belt is down to 36" (under his roll). ~  Weight 7/14 = 230# but he has quit smoking he says, now must lose the weight...  GASTRITIS (ICD-535.50) - had EGD 10/05 showing gastritis... on RANITADINE 300mg /d due to his Plavix Rx, off prev Omep.  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 ~  4/12:  Pt reported f/u cysto was neg, doing well... ~  10/12:  Note from [name] reviewed> cysto was neg, no recurrent tumors... ~  4/13:  Note from [name] reviewed> neg f/u cysto w/o recurrent TCCa... He wants to continue Q43mo cystoscopies... ~  1/14:  He had f/u w/ [name]> Hx TCCa bladder, s/p TURBT 9/11, given BCG 11/11-12/11 and f/u Cysto 1/12 was neg; repeat cysto 1/14 was neg as well...  DEGENERATIVE JOINT DISEASE (ICD-715.90) - He has c/o right shoulder pain, hip pain, knee pain, and LBP> Rx ADVIL w/ some  benefit... offered Ortho referral & he will decide.  ANXIETY (ICD-300.00) - on ALPRAZOLAM 0.5mg  Prn...   Past Surgical History  Procedure Laterality Date  . Cataract surgery/sub posterior vitrectomy in left eye  1996    Dr. Luciana Axe  . Cataract surgery  septemeber 2013    Outpatient Encounter Prescriptions as of 06/05/2013  Medication Sig Dispense Refill  . ALPRAZolam (XANAX) 0.5 MG tablet TAKE ONE TABLET BY MOUTH THREE TIMES DAILY AS NEEDED  90 tablet  4  . amLODipine (NORVASC) 10 MG tablet TAKE 1 TABLET (10 MG TOTAL) BY MOUTH DAILY.  90 tablet  4  . aspirin 81 MG tablet Take 81 mg by mouth daily.        . Flaxseed, Linseed, (FLAXSEED OIL) 1000 MG CAPS Take 1 capsule by mouth daily.        . Fluticasone-Salmeterol (ADVAIR DISKUS) 250-50 MCG/DOSE AEPB Inhale 1 puff into the lungs every 12 (twelve) hours.  60 each  1  . Ibuprofen (ADVIL PO) Take by mouth as needed.        [name] lisinopril (PRINIVIL,ZESTRIL) 40 MG tablet TAKE 1 TABLET (40 MG TOTAL) BY MOUTH DAILY.  90 tablet  4  . metoprolol tartrate (LOPRESSOR) 25 MG tablet TAKE TWO TABLETS BY MOUTH IN THE MORNING AND ONE AT NIGHT  90 tablet  4  . Multiple Vitamin (MULTIVITAMIN) capsule Take 1 capsule by mouth daily.        . Multiple Vitamins-Minerals (OCUVITE ADULT 50+ PO) Take 1 tablet by mouth 2 (two) times daily.        . nitroGLYCERIN (NITROSTAT) 0.4 MG SL tablet Place 1 tablet (0.4 mg total) under the tongue every 5 (five) minutes as needed.  25 tablet  11  . Omega-3 Fatty Acids (FISH OIL) 1200 MG CAPS Take 1 capsule by mouth daily.        . simvastatin (ZOCOR) 20 MG tablet TAKE 1 TABLET (20 MG TOTAL) BY MOUTH AT BEDTIME.  90 tablet  4   No facility-administered encounter medications on file as of 06/05/2013.    Allergies  Allergen Reactions  . Dextromethorphan-Guaifenesin     REACTION: GI upset    Current Medications, Allergies, Past Medical History, Past Surgical History, Family History, and Social History were reviewed in  [employer] record.    Review of Systems        See HPI - all other systems neg except as noted... The patient complains of some dyspnea on exertion.  The patient denies anorexia, fever, weight loss, weight gain, vision loss, decreased hearing, hoarseness, chest pain, syncope, peripheral edema, prolonged cough, headaches, hemoptysis, abdominal pain, melena, hematochezia, severe indigestion/heartburn, hematuria, incontinence, muscle weakness, suspicious skin lesions, transient blindness, difficulty walking, depression, unusual weight change, abnormal bleeding, enlarged lymph nodes, and angioedema.     Objective:   Physical Exam      WD, Obese, 67 y/o WM in NAD... Vital Signs:  Reviewed... GENERAL:  Alert & oriented; pleasant & cooperative... HEENT:  [place]/AT, EOM-wnl, PERRLA, EACs-clear, TMs-wnl, NOSE- sl congestion, THROAT-clear & wnl. NECK:  Supple w/ fairROM; no JVD; normal carotid impulses w/o bruits; no thyromegaly or nodules palpated; no lymphadenopathy. CHEST:  Clear to P & A; without wheezes/ rales/ or rhonchi heard... HEART:  Regular Rhythm; without murmurs/ rubs/ or gallops detected... ABDOMEN:  Obese, soft, & nontender; normal bowel sounds; no organomegaly or masses detected. EXT: without deformities, mild arthritic changes; no varicose veins/ +venous insuffic/ no edema. NEURO:  CN's intact; no focal neuro deficits... DERM:  No lesions noted; no rash etc...  RADIOLOGY DATA:  Reviewed in the EPIC EMR & discussed w/ the patient...  LABORATORY DATA:  Reviewed in the EPIC EMR & discussed w/ the patient...   Assessment & Plan:    COPD>  He says he has quit smoking, & gained wt to prove it! continue Advair, Mucinex prn, wt reduction/ exercise/ etc...  HBP>  Controlled on meds + diet & exercise program...  CAD>  On ASA/ off Plavix & stable w/o angina, etc...  LIPIDS>  Improved his Simva20/ FishOil/ & wine daily (states DrBrodie told him to do  this)...  DM>  Labs stable but he'll need meds soon if he doesn't get the wt down!  GI> Hx Gastritis, Polpyps>  On Ranitadine w/o symptoms & f/u colon due from DrStark in 2014...  GU>  He has BPH w/ LTOS, and TCCa of bladder, followed by [name] & pt reports surveillence cysto 1/14 was neg...   Patient's Medications  New Prescriptions   IBUPROFEN (ADVIL,MOTRIN) 600 MG TABLET    Take 1 tablet (600 mg total) by mouth daily as needed for pain.  Previous Medications   AMLODIPINE (NORVASC) 10 MG TABLET    TAKE 1 TABLET (10 MG TOTAL) BY MOUTH DAILY.   ASPIRIN 81 MG TABLET    Take 81 mg by mouth daily.     FLAXSEED, LINSEED, (FLAXSEED OIL) 1000 MG CAPS    Take 1 capsule by mouth daily.     FLUTICASONE-SALMETEROL (ADVAIR DISKUS) 250-50 MCG/DOSE AEPB    Inhale 1 puff into the lungs every 12 (twelve) hours.   LISINOPRIL (PRINIVIL,ZESTRIL) 40 MG TABLET    TAKE 1 TABLET (40 MG TOTAL) BY MOUTH DAILY.   METOPROLOL TARTRATE (LOPRESSOR) 25 MG TABLET    TAKE TWO TABLETS BY MOUTH IN THE MORNING AND ONE AT NIGHT   MULTIPLE VITAMIN (MULTIVITAMIN) CAPSULE    Take 1 capsule by mouth daily.     MULTIPLE VITAMINS-MINERALS (OCUVITE ADULT 50+ PO)    Take 1 tablet by mouth 2 (two) times daily.     NITROGLYCERIN (NITROSTAT) 0.4 MG SL TABLET    Place 1 tablet (0.4 mg total) under the tongue every 5 (five) minutes as needed.   OMEGA-3 FATTY ACIDS (FISH OIL) 1200 MG CAPS    Take 1 capsule by mouth daily.    Modified Medications   Modified Medication Previous Medication   ALPRAZOLAM (XANAX) 0.5 MG TABLET ALPRAZolam (XANAX) 0.5 MG tablet      TAKE ONE TABLET BY MOUTH THREE TIMES DAILY AS NEEDED    TAKE ONE TABLET BY MOUTH THREE TIMES DAILY AS NEEDED   SIMVASTATIN (ZOCOR) 20 MG TABLET simvastatin (ZOCOR) 20 MG tablet      TAKE 1 TABLET (20 MG TOTAL) BY MOUTH AT BEDTIME.    TAKE 1 TABLET (20 MG TOTAL) BY MOUTH AT BEDTIME.  Discontinued Medications   IBUPROFEN (ADVIL PO)    Take by mouth as needed.

## 2013-06-12 DIAGNOSIS — Z8551 Personal history of malignant neoplasm of bladder: Secondary | ICD-10-CM | POA: Diagnosis not present

## 2013-06-12 DIAGNOSIS — D090 Carcinoma in situ of bladder: Secondary | ICD-10-CM | POA: Diagnosis not present

## 2013-06-29 ENCOUNTER — Telehealth: Payer: Self-pay | Admitting: Pulmonary Disease

## 2013-06-29 MED ORDER — FLUTICASONE-SALMETEROL 250-50 MCG/DOSE IN AEPB: 1.0000 | INHALATION_SPRAY | Freq: Two times a day (BID) | RESPIRATORY_TRACT | Status: DC

## 2013-06-29 NOTE — Telephone Encounter (Signed)
Rx has been sent in. 

## 2013-07-06 ENCOUNTER — Ambulatory Visit (INDEPENDENT_AMBULATORY_CARE_PROVIDER_SITE_OTHER): Payer: Medicare Other | Admitting: Gastroenterology

## 2013-07-06 ENCOUNTER — Encounter: Payer: Self-pay | Admitting: Gastroenterology

## 2013-07-06 VITALS — BP 120/74 | HR 72 | Ht 67.0 in | Wt 228.6 lb

## 2013-07-06 DIAGNOSIS — Z1211 Encounter for screening for malignant neoplasm of colon: Secondary | ICD-10-CM | POA: Diagnosis not present

## 2013-07-06 DIAGNOSIS — K5900 Constipation, unspecified: Secondary | ICD-10-CM

## 2013-07-06 MED ORDER — PEG-KCL-NACL-NASULF-NA ASC-C 100 G PO SOLR: 1.0000 | Freq: Once | ORAL | Status: DC

## 2013-07-06 NOTE — Patient Instructions (Addendum)
You have been scheduled for a colonoscopy with propofol. Please follow written instructions given to you at your visit today.  Please pick up your prep kit at the pharmacy within the next 1-3 days. If you use inhalers (even only as needed), please bring them with you on the day of your procedure. Your physician has requested that you go to www.[email or web address] and enter the access code given to you at your visit today. This web site gives a general overview about your procedure. However, you should still follow specific instructions given to you by our office regarding your preparation for the procedure.  Thank you for choosing me and [facility].  Malcolm T. Stark, Jr., MD., FACG  

## 2013-07-06 NOTE — Progress Notes (Signed)
History of Present Illness: This is a 67 year old male accompanied by his wife. He was recently evaluated by Dr. Kriste Basque and is referred for colorectal cancer screening. He notes occasional mild constipation and has no other gastrointestinal complaints. Denies weight loss, abdominal pain, diarrhea, change in stool caliber, melena, hematochezia, nausea, vomiting, dysphagia, reflux symptoms, chest pain.  Review of Systems: Pertinent positive and negative review of systems were noted in the above HPI section. All other review of systems were otherwise negative.  Current Medications, Allergies, Past Medical History, Past Surgical History, Family History and Social History were reviewed in [employer] [facility] record.  Physical Exam: General: Well developed , well nourished, obese, no acute distress Head: Normocephalic and atraumatic Eyes:  sclerae anicteric, EOMI Ears: Normal auditory acuity Mouth: No deformity or lesions Neck: Supple, no masses or thyromegaly Lungs: Clear throughout to auscultation Heart: Regular rate and rhythm; no murmurs, rubs or bruits Abdomen: Soft, large, non tender and non distended. No masses, hepatosplenomegaly or hernias noted. Normal Bowel sounds Rectal: deferred to colonoscopy Musculoskeletal: Symmetrical with no gross deformities  Skin: No lesions on visible extremities Pulses:  Normal pulses noted Extremities: No clubbing, cyanosis, edema or deformities noted Neurological: Alert oriented x 4, grossly nonfocal Cervical Nodes:  No significant cervical adenopathy Inguinal Nodes: No significant inguinal adenopathy Psychological:  Alert and cooperative. Normal mood and affect  Assessment and Recommendations:  1. Colorectal cancer screening, average risk. Prior history of a hyperplastic colon polyp in 2004. Mild occasional, constipation. Increase dietary fiber and water intake. Schedule colonoscopy. The risks, benefits, and alternatives to colonoscopy  with possible biopsy and possible polypectomy were discussed with the patient and they consent to proceed.   [name]

## 2013-07-11 ENCOUNTER — Encounter: Payer: Self-pay | Admitting: Gastroenterology

## 2013-07-11 ENCOUNTER — Ambulatory Visit (AMBULATORY_SURGERY_CENTER): Payer: Medicare Other | Admitting: Gastroenterology

## 2013-07-11 VITALS — BP 116/68 | HR 62 | Temp 96.9°F | Resp 12 | Ht 67.0 in | Wt 228.0 lb

## 2013-07-11 DIAGNOSIS — J449 Chronic obstructive pulmonary disease, unspecified: Secondary | ICD-10-CM | POA: Diagnosis not present

## 2013-07-11 DIAGNOSIS — Z1211 Encounter for screening for malignant neoplasm of colon: Secondary | ICD-10-CM | POA: Diagnosis not present

## 2013-07-11 DIAGNOSIS — E119 Type 2 diabetes mellitus without complications: Secondary | ICD-10-CM | POA: Diagnosis not present

## 2013-07-11 DIAGNOSIS — I10 Essential (primary) hypertension: Secondary | ICD-10-CM | POA: Diagnosis not present

## 2013-07-11 DIAGNOSIS — D126 Benign neoplasm of colon, unspecified: Secondary | ICD-10-CM

## 2013-07-11 DIAGNOSIS — E669 Obesity, unspecified: Secondary | ICD-10-CM | POA: Diagnosis not present

## 2013-07-11 MED ORDER — SODIUM CHLORIDE 0.9 % IV SOLN: 500.0000 mL | INTRAVENOUS | Status: DC

## 2013-07-11 NOTE — Op Note (Signed)
[facility] [address] [place], [address]   COLONOSCOPY PROCEDURE REPORT PATIENT: [name], [name]  MR#: [id number] BIRTHDATE: [birth year] , 67  yrs. old GENDER: Male ENDOSCOPIST: Meryl Dare, MD, [facility] PROCEDURE DATE:  07/11/2013 PROCEDURE:   Colonoscopy with snare polypectomy First Screening Colonoscopy - Avg.  risk and is 50 yrs.  old or older - No.  Prior Negative Screening - Now for repeat screening. 10 or more years since last screening  History of Adenoma - Now for follow-up colonoscopy & has been > or = to 3 yrs.  N/A  Polyps Removed Today? Yes. ASA CLASS:   Class II INDICATIONS:average risk screening. MEDICATIONS: MAC sedation, administered by CRNA and propofol (Diprivan) 300mg  IV DESCRIPTION OF PROCEDURE:   After the risks benefits and alternatives of the procedure were thoroughly explained, informed consent was obtained.  A digital rectal exam revealed no abnormalities of the rectum.   The LB [id number]  endoscope was introduced through the anus and advanced to the cecum, which was identified by both the appendix and ileocecal valve. No adverse events experienced.   The quality of the prep was good, using MoviPrep  The instrument was then slowly withdrawn as the colon was fully examined.  COLON FINDINGS: A sessile polyp measuring 5 mm in size was found in the ascending colon.  A polypectomy was performed with a cold snare.  The resection was complete and the polyp tissue was completely retrieved.   A sessile polyp measuring 7 mm in size was found in the transverse colon.  A polypectomy was performed with a cold snare.  The resection was complete and the polyp tissue was completely retrieved.   A few sessile polyps measuring 6 mm in size were found in the sigmoid colon.  A polypectomy was performed with a cold snare.  The resection was complete and the polyp tissue was completely retrieved.   Mild diverticulosis was noted in the sigmoid  colon.   The colon was otherwise normal.  There was no diverticulosis, inflammation, polyps or cancers unless previously stated.  Retroflexed views revealed no abnormalities. The time to cecum=1 minutes 26 seconds.  Withdrawal time=11 minutes 50 seconds. The scope was withdrawn and the procedure completed. COMPLICATIONS: There were no complications. ENDOSCOPIC IMPRESSION: 1.   Sessile polyp measuring 5 mm in the ascending colon; polypectomy performed with a cold snare 2.   Sessile polyp measuring 7 mm in the transverse colon; polypectomy performed with a cold snare 3.   Sessile polyp measuring 6 mm in the sigmoid colon; polypectomy performed with a cold snare 4.   Mild diverticulosis was noted in the sigmoid colon RECOMMENDATIONS: 1.  Await pathology results 2.  Repeat colonoscopy in 5 years if polyp(s) adenomatous; otherwise 10 years 3.  High fiber diet with liberal fluid intake.  eSigned:  Meryl Dare, MD, [facility] 07/11/2013 9:14 AM

## 2013-07-11 NOTE — Progress Notes (Signed)
Lidocaine-40mg IV prior to Propofol InductionPropofol given over incremental dosages 

## 2013-07-11 NOTE — Progress Notes (Signed)
Called to room to assist during endoscopic procedure.  Patient ID and intended procedure confirmed with present staff. Received instructions for my participation in the procedure from the performing physician.  

## 2013-07-11 NOTE — Progress Notes (Signed)
Patient did not experience any of the following events: a burn prior to discharge; a fall within the facility; wrong site/side/patient/procedure/implant event; or a hospital transfer or hospital admission upon discharge from the facility. (G8907) Patient did not have preoperative order for IV antibiotic SSI prophylaxis. ([id number])  

## 2013-07-11 NOTE — Patient Instructions (Signed)
YOU HAD AN ENDOSCOPIC PROCEDURE TODAY AT THE [facility]: Refer to the procedure report that was given to you for any specific questions about what was found during the examination.  If the procedure report does not answer your questions, please call your gastroenterologist to clarify.  If you requested that your care partner not be given the details of your procedure findings, then the procedure report has been included in a sealed envelope for you to review at your convenience later.  YOU SHOULD EXPECT: Some feelings of bloating in the abdomen. Passage of more gas than usual.  Walking can help get rid of the air that was put into your GI tract during the procedure and reduce the bloating. If you had a lower endoscopy (such as a colonoscopy or flexible sigmoidoscopy) you may notice spotting of blood in your stool or on the toilet paper. If you underwent a bowel prep for your procedure, then you may not have a normal bowel movement for a few days.  DIET: Your first meal following the procedure should be a light meal and then it is ok to progress to your normal diet.  A half-sandwich or bowl of soup is an example of a good first meal.  Heavy or fried foods are harder to digest and may make you feel nauseous or bloated.  Likewise meals heavy in dairy and vegetables can cause extra gas to form and this can also increase the bloating.  Drink plenty of fluids but you should avoid alcoholic beverages for 24 hours.  ACTIVITY: Your care partner should take you home directly after the procedure.  You should plan to take it easy, moving slowly for the rest of the day.  You can resume normal activity the day after the procedure however you should NOT DRIVE or use heavy machinery for 24 hours (because of the sedation medicines used during the test).    SYMPTOMS TO REPORT IMMEDIATELY: A gastroenterologist can be reached at any hour.  During normal business hours, 8:30 AM to 5:00 PM Monday through Friday,  call [phone].  After hours and on weekends, please call the GI answering service at [phone] who will take a message and have the physician on call contact you.   Following lower endoscopy (colonoscopy or flexible sigmoidoscopy):  Excessive amounts of blood in the stool  Significant tenderness or worsening of abdominal pains  Swelling of the abdomen that is new, acute  Fever of 100F or higher    FOLLOW UP: If any biopsies were taken you will be contacted by phone or by letter within the next 1-3 weeks.  Call your gastroenterologist if you have not heard about the biopsies in 3 weeks.  Our staff will call the home number listed on your records the next business day following your procedure to check on you and address any questions or concerns that you may have at that time regarding the information given to you following your procedure. This is a courtesy call and so if there is no answer at the home number and we have not heard from you through the emergency physician on call, we will assume that you have returned to your regular daily activities without incident.  SIGNATURES/CONFIDENTIALITY: You and/or your care partner have signed paperwork which will be entered into your electronic medical record.  These signatures attest to the fact that that the information above on your After Visit Summary has been reviewed and is understood.  Full responsibility of the confidentiality   of this discharge information lies with you and/or your care-partner.     

## 2013-07-12 ENCOUNTER — Telehealth: Payer: Self-pay | Admitting: *Deleted

## 2013-07-12 NOTE — Telephone Encounter (Signed)
  Follow up Call-  Call back number 07/11/2013  Post procedure Call Back phone  # [phone]  Permission to leave phone message Yes     Patient questions:  Do you have a fever, pain , or abdominal swelling? no Pain Score  0 *  Have you tolerated food without any problems? yes  Have you been able to return to your normal activities? yes  Do you have any questions about your discharge instructions: Diet   no Medications  no Follow up visit  no  Do you have questions or concerns about your Care? no  Actions: * If pain score is 4 or above: No action needed, pain <4.

## 2013-07-17 ENCOUNTER — Encounter: Payer: Self-pay | Admitting: Gastroenterology

## 2013-08-23 DIAGNOSIS — Z23 Encounter for immunization: Secondary | ICD-10-CM | POA: Diagnosis not present

## 2013-09-05 ENCOUNTER — Other Ambulatory Visit: Payer: Self-pay | Admitting: Pulmonary Disease

## 2013-09-05 MED ORDER — FLUTICASONE-SALMETEROL 250-50 MCG/DOSE IN AEPB: 1.0000 | INHALATION_SPRAY | Freq: Two times a day (BID) | RESPIRATORY_TRACT | Status: DC

## 2013-10-01 ENCOUNTER — Other Ambulatory Visit: Payer: Self-pay | Admitting: Pulmonary Disease

## 2013-10-08 DIAGNOSIS — Z961 Presence of intraocular lens: Secondary | ICD-10-CM | POA: Diagnosis not present

## 2013-12-03 ENCOUNTER — Other Ambulatory Visit: Payer: Self-pay | Admitting: Pulmonary Disease

## 2013-12-05 ENCOUNTER — Encounter: Payer: Self-pay | Admitting: Pulmonary Disease

## 2013-12-05 ENCOUNTER — Ambulatory Visit (INDEPENDENT_AMBULATORY_CARE_PROVIDER_SITE_OTHER): Payer: Medicare Other | Admitting: Pulmonary Disease

## 2013-12-05 VITALS — BP 120/72 | HR 70 | Temp 98.6°F | Ht 67.0 in | Wt 229.2 lb

## 2013-12-05 DIAGNOSIS — K2990 Gastroduodenitis, unspecified, without bleeding: Secondary | ICD-10-CM

## 2013-12-05 DIAGNOSIS — F17200 Nicotine dependence, unspecified, uncomplicated: Secondary | ICD-10-CM

## 2013-12-05 DIAGNOSIS — J449 Chronic obstructive pulmonary disease, unspecified: Secondary | ICD-10-CM | POA: Diagnosis not present

## 2013-12-05 DIAGNOSIS — F411 Generalized anxiety disorder: Secondary | ICD-10-CM

## 2013-12-05 DIAGNOSIS — I2510 Atherosclerotic heart disease of native coronary artery without angina pectoris: Secondary | ICD-10-CM

## 2013-12-05 DIAGNOSIS — M1990 Unspecified osteoarthritis, unspecified site: Secondary | ICD-10-CM

## 2013-12-05 DIAGNOSIS — D126 Benign neoplasm of colon, unspecified: Secondary | ICD-10-CM

## 2013-12-05 DIAGNOSIS — C679 Malignant neoplasm of bladder, unspecified: Secondary | ICD-10-CM

## 2013-12-05 DIAGNOSIS — I10 Essential (primary) hypertension: Secondary | ICD-10-CM

## 2013-12-05 DIAGNOSIS — K2970 Gastritis, unspecified, without bleeding: Secondary | ICD-10-CM

## 2013-12-05 DIAGNOSIS — E7800 Pure hypercholesterolemia, unspecified: Secondary | ICD-10-CM

## 2013-12-05 DIAGNOSIS — R7309 Other abnormal glucose: Secondary | ICD-10-CM

## 2013-12-05 DIAGNOSIS — E669 Obesity, unspecified: Secondary | ICD-10-CM

## 2013-12-05 MED ORDER — METOPROLOL TARTRATE 25 MG PO TABS: ORAL_TABLET | ORAL | Status: DC

## 2013-12-05 MED ORDER — NITROGLYCERIN 0.4 MG SL SUBL: 0.4000 mg | SUBLINGUAL_TABLET | SUBLINGUAL | Status: DC | PRN

## 2013-12-05 MED ORDER — FLUTICASONE-SALMETEROL 250-50 MCG/DOSE IN AEPB: 1.0000 | INHALATION_SPRAY | Freq: Two times a day (BID) | RESPIRATORY_TRACT | Status: DC

## 2013-12-05 MED ORDER — LISINOPRIL 40 MG PO TABS: ORAL_TABLET | ORAL | Status: DC

## 2013-12-05 MED ORDER — ALPRAZOLAM 0.5 MG PO TABS: ORAL_TABLET | ORAL | Status: DC

## 2013-12-05 MED ORDER — SIMVASTATIN 20 MG PO TABS: ORAL_TABLET | ORAL | Status: DC

## 2013-12-05 MED ORDER — AMLODIPINE BESYLATE 10 MG PO TABS: ORAL_TABLET | ORAL | Status: DC

## 2013-12-05 NOTE — Progress Notes (Signed)
Subjective:    Patient ID: [name], male    DOB: [birth year], 68 y.o.   MRN: [id number]  HPI 68 y/o WM here for a follow up visit and on-going management of mult med problems...   ~  April 18, 2012:  88mo ROV & [name] says that he is doing well & denies new complaints or concerns; he tells me he is sched for Cat surg soon...     He saw [name] 2/13 for Cards f/u> Hx CAD & IWMI 1995, hx mult PCIs, Myoview 2/13 showed fixed defect in infer wall, no ischemia, EF=58%; despite everything he continues to smoke! They decided to stop his Plavix since he wasn't taking it regularly anyway & sustitute ASA81mg /d...    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 We reviewed prob list, meds, xrays and labs> see below>>  ~  October 19, 2012:  76mo ROV & [name] reports stable, proud that he has decreased his smoking further to 1/4 ppd, encouraged to quit... We reviewed the following problems during today's visit>>    COPD, smoker> on Advair250; still smoking but decr to 1/4ppd; min cough, sm amt beige phlegm w/o blood, denies SOB but sedentary x yard work...    HBP> on Metop25-3/d, Amlod10, Lisin40; BP=122/82 & he denies CP, palpit, SOB, edema...    CAD> on ASA81; followed by [name] & follow up due; denies angina etc but too sedentary; his 73 y/o brother died recently w/ AAA rupture...    Chol> on Simva20; FLP 6/13 showed TChol 130, TG 102, HDL 33, LDL 77     DM> on diet alone; we reviewed low carb diet, exercise program, and wt reduction...    Obese> wt=219# which is up 8# despite his attempts at diet, exercise    GI- Gastritis, polyps> prev on Zantac; last colon 2004 w/ hyperplastic polyp removed & f/u due 2014...    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    DJD> on OTC analgesics as needed...    Anxiety> on Alpraz0.5mg  prn... We reviewed prob list, meds, xrays and labs> see below for updates >> he received 2013 Flu vaccine 11/13...  ~  June 05, 2013:  42mo ROV and [name] has gained 11# having stopped smoking in the interval, states his last cig was 6/22- now we have to work on his weight;  We reviewed the following medical problems during today's office visit >>     COPD, ex-smoker> on Advair250; states he quit smoking 6/14; min cough, sm amt beige phlegm w/o blood, denies SOB but sedentary x yard work...    HBP> on Metop25-3/d, Amlod10, Lisin40; BP=130/78 & he denies CP, palpit, SOB, edema...    CAD> on ASA81; followed by [name] & seen 3/14> CAD, IWMI 1995, mult PCIs, last Myoview 2013 was low risk; too sedentary, no changes made; 36 y/o brother died recently w/ AAA rupture...    Chol> on Simva20; FLP 7/14 showed TChol 158, TG 145, HDL 33, LDL 96; needs better diet & wt reduction or incr dose next time...     DM> on diet alone; LABS 7/14 showed BS= 120, A1c= 6.7; we reviewed low carb diet, exercise program, and wt reduction.Marland KitchenMarland Kitchen  Obese> wt=230# which is up 11# despite his attempts at diet, exercise!    GI- Gastritis, polyps> prev on Zantac; last colon 2004 w/ hyperplastic polyp removed & f/u due 2014...    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    DJD> on OTC analgesics as needed...    Anxiety> on Alpraz0.5mg  prn... We reviewed prob list, meds, xrays and labs> see below for updates >>   CXR 7/14 showed COPD/ emphysema, incr markings and scarring at the bases, NAD...  LABS 7/14:  FLP- ok on Simva20;  Chems- ok x BS=120 A1c=6.7;  CBC- wnl;  TSH=1.26;  PSA=0.41...   ~  December 05, 2013:  61mo ROV & [name] says he's decr his smoking from Prev 3-4ppd down to 3 cig/d;  His weight is unchanged at 229# w/ BMI=36...    He remains on  Advair250> states breathing is ok, at baseline CAT score=12; baseline cough, sput, no hemoptysis, stable DOE, no edema...    BP remains stable on Metop25-2AM & 1PM, Amlod5, Lisin40; BP= 120/72 and he denies CP, palpit, ch in SOB, edema...    Chol is treated w/ Simva20> last FLP 7/14 was ok x low HDL=33, weight = 229# (no change) & we reviewed diet, exercise, wt reduction strategies...    Borderline DM treated w/ diet alone 7 he realizes that he must lose wt, limit carbs, or face DM meds in the future... We reviewed prob list, meds, xrays and labs> see below for updates >> he received the 2014 flu vaccine 10/14 and requests refills today...           Problem List:  Hx of SINUSITIS (ICD-473.9) - off Nasonex now & recent Rx w/ Augmentin/ Saline... notes occas sinus congestion, drainage, sl HA, etc... he knows that he needs to quit smoking! ~  10/10: Adm for epistaxis w/ eval by DrWolicki- CXR= NAD, labs OK, rec for saline lavage- nose bleed resolved & disch home (no recurrence so far).  COPD (ICD-496) - on ADVAIR 250Bid; STILL SMOKES 5-10 cig/d & he's refused Chantix...  ~  He had hemoptysis 6/99 without lesion on CXR, CTChest, or Bronchoscopy... ~  Adm 10/10 w/ epistaxsis & eval DrWolicki as noted> he wanted to do Sleep Study but pt cancelled it & declined to resched. 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 ~  CXR 5/12 showed ectatic calcif & tort Aorta, COPD w/ incr markings ar bases, NAD, +degen spondylosis. ~  CXR 6/13 showed COPD/ emphysema, scarring in the lower lobes, otherw clear lungs, DJD in spine... ~  CXR 7/14 showed COPD/ emphysema, incr markings and scarring at the bases, NAD. ~  1/15: [name] says he's decr his smoking from Prev 3-4ppd down to 3 cig/d;  He remains on Advair250> states breathing is ok, at baseline CAT score=12; baseline cough, sput, no hemoptysis, stable DOE, no edema...  HYPERTENSION (ICD-401.9) - controlled on METOPROLOL 25mg Tid,  NORVASC 10mg /d,  LISINOPRIL 40mg /d...   ~  11/11:  BP=122/70, not checking BP's at home... denies HA, fatigue, visual changes, CP, palipit, dizziness, syncope, dyspnea, edema, etc. ~  5/12:  BP= 120/72, pt very pleased w/ his wt loss & improved exercise ability. ~  6/13:  BP= 122/64 & feeling well w/o CP, palpit, SOB, edema, etc... ~  12/13:  BP=122/82 & he denies CP, palpit, SOB, edema... ~  7/14: on Metop25-3/d, Amlod10, Lisin40; BP=130/78 &  he denies CP, palpit, SOB, edema ~  1/15: BP remains stable on Metop25-2AM & 1PM, Amlod5, Lisin40; BP= 120/72 and he denies CP, palpit, ch in SOB, edema.  CORONARY ARTERY DISEASE (ICD-414.00) - ASA 81mg /d & off Plavix as of 2/13 officially as he wasn't taking it regularly anyway. ~  Hx prev IWMI and PTCA/ stent in RCA by DrBrodie 12/95 & 5/96...  ~  Myoview 4/08 abnormal w/ inferior ischemia and subseq cath revealing restenoses in RCA- s/p repeat PTCA/ stents...  ~  f/u Myoview 9/08 w/ prev infer infarct, no ischemia, EF=56%... ~  9/11:  pre op cardiac eval DrBrodie> OK to hold ASA/ Plavix for bladder surg. 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 ~  2/13:  He had f/u [name] w/ Myoview showing a fixed defect in infer wall, no ischemia, EF=58%; pt wasn't taking Plavix, therefore stopped in favor of ASA81mg /d... ~  3/14:  He had f/u [name]> CAD, IWMI 1995, mult PCIs, last Myoview 2013 was low risk; too sedentary, no changes made; 52 y/o brother died recently w/ AAA rupture... ~  EKG 3/14 showed NSR, rate65, WNL, NAD...  HYPERCHOLESTEROLEMIA (Mixed Hyperlipidemia) - prev on Simva80 but DrBrodie decr him to SIMVASTATIN 20mg /d + Fish Oil & Red Wine qd... ~  Browning 9/08 on Simva40 showed TChol 151, TG 106, HDL 23, LDL 107 ~  FLP 8/09 on Simva80 showed TChol 142, TG 135, HDL 28, LDL 87 ~  FLP 6/10 on Simva80 showed TChol 151, TG 117, HDL 31, LDL 97 ~  FLP in hosp 10/10 showed TChol 128, TG 110, HDL  26, LDL 80 ~  FLP 5/11 on Simva80 showed TChol 144, TG 215, HDL 26, LDL 89 ~  9/11:  DrBrodie decr him to Simva20... we discussed the need for better diet & wt reduction. ~  FLP 5/12 on Simva20 showed TChol 162, TG 159, HDL 32, LDL 98 ~  FLP 6/13 on Simva20 showed TChol 130, TG 102, HDL 33, LDL 77... rec to incr exercise program & get wt down! ~  FLP 7/14 on Simva20 showed TChol 158, TG 145, HDL 33, LDL 96... He has gained way too much wt 7 must get on diet!  DIABETES MELLITUS, BORDERLINE (ICD-790.29) - on diet alone... ~  labs 9/08 showed BS=100, HgA1c=6.1 ~  labs 8/09 showed BS= 107, A1c= 6.2 ~  labs 6/10 showed BS= 108, A1c= 6.2 ~  lab 5/11 (wt=244#) showed BS= 121. A1c= 6.7.[name]. he was told "get wt down, or start meds". ~  Labs 5/12 showed BS= 109, A1c= 6.1 ~  Labs 6/13 showed BS= 114, & he needs to lose the weight... ~  Labs 7/14 on diet alone showed BS= 120, A1c= 6.7  OBESITY (ICD-278.00) - weight was down as low as 202# in 2/09, and steadily incr to 244# by 5/11... ~  weight 5/11 = 244#.[name].  we reviewed diet + exercise necessary to lose weight... ~  weight 11/11 = 217# (after dx & rx for bladder cancer) ~  Weight 5/12 = 214# but says it's 192# at home... ~  Weight 6/13 = 211# but knows it's better since his pant/ belt is down to 36" (under his roll). ~  Weight 7/14 = 230# but he has quit smoking he says, now must lose the weight... ~  Weight 1/15 = 229#  GASTRITIS (ICD-535.50) - had EGD 10/05 showing gastritis... on RANITADINE  300mg /d due to his Plavix Rx, off prev Omep.  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 ~   4/12:  Pt reported f/u cysto was neg, doing well... ~  10/12:  Note from [name] reviewed> cysto was neg, no recurrent tumors... ~  4/13:  Note from [name] reviewed> neg f/u cysto w/o recurrent TCCa... He wants to continue Q57mo cystoscopies... ~  1/14:  He had f/u w/ [name]> Hx TCCa bladder, s/p TURBT 9/11, given BCG 11/11-12/11 and f/u Cysto 1/12 was neg; repeat cysto 1/14 was neg as well...  DEGENERATIVE JOINT DISEASE (ICD-715.90) - He has c/o right shoulder pain, hip pain, knee pain, and LBP> Rx ADVIL w/ some benefit... offered Ortho referral & he will decide.  ANXIETY (ICD-300.00) - on ALPRAZOLAM 0.5mg  Prn...   Past Surgical History  Procedure Laterality Date  . Cataract surgery/sub posterior vitrectomy in left eye  1996    Dr. Zadie Rhine  . Cataract surgery  septemeber 2013  . [facility]  [phone]    Outpatient Encounter Prescriptions as of 12/05/2013  Medication Sig  . ALPRAZolam (XANAX) 0.5 MG tablet TAKE ONE TABLET BY MOUTH THREE TIMES DAILY AS NEEDED  . amLODipine (NORVASC) 10 MG tablet TAKE 1 TABLET (10 MG TOTAL) BY MOUTH DAILY.  [name] aspirin 81 MG tablet Take 81 mg by mouth daily.    . Flaxseed, Linseed, (FLAXSEED OIL) 1000 MG CAPS Take 1 capsule by mouth daily.    . Fluticasone-Salmeterol (ADVAIR DISKUS) 250-50 MCG/DOSE AEPB Inhale 1 puff into the lungs every 12 (twelve) hours.  [name] ibuprofen (ADVIL,MOTRIN) 600 MG tablet Take 1 tablet (600 mg total) by mouth daily as needed for pain.  [name] lisinopril (PRINIVIL,ZESTRIL) 40 MG tablet TAKE 1 TABLET (40 MG TOTAL) BY MOUTH DAILY.  . metoprolol tartrate (LOPRESSOR) 25 MG tablet TAKE TWO TABLETS BY MOUTH IN THE MORNING AND ONE TABLET IN THE EVENING  . Multiple Vitamin (MULTIVITAMIN) capsule Take 1 capsule by mouth daily.    . Multiple Vitamins-Minerals (OCUVITE ADULT 50+ PO) Take 1 tablet by mouth 2 (two) times daily.    . nitroGLYCERIN (NITROSTAT) 0.4 MG SL tablet Place 1 tablet (0.4 mg total) under the tongue every 5 (five) minutes as  needed.  . Omega-3 Fatty Acids (FISH OIL) 1200 MG CAPS Take 1 capsule by mouth daily.    . simvastatin (ZOCOR) 20 MG tablet TAKE 1 TABLET (20 MG TOTAL) BY MOUTH AT BEDTIME.    Allergies  Allergen Reactions  . Dextromethorphan-Guaifenesin     REACTION: GI upset    Current Medications, Allergies, Past Medical History, Past Surgical History, Family History, and Social History were reviewed in [employer] record.    Review of Systems        See HPI - all other systems neg except as noted... The patient complains of some dyspnea on exertion.  The patient denies anorexia, fever, weight loss, weight gain, vision loss, decreased hearing, hoarseness, chest pain, syncope, peripheral edema, prolonged cough, headaches, hemoptysis, abdominal pain, melena, hematochezia, severe indigestion/heartburn, hematuria, incontinence, muscle weakness, suspicious skin lesions, transient blindness, difficulty walking, depression, unusual weight change, abnormal bleeding, enlarged  lymph nodes, and angioedema.     Objective:   Physical Exam      WD, Obese, 68 y/o WM in NAD... Vital Signs:  Reviewed... GENERAL:  Alert & oriented; pleasant & cooperative... HEENT:  [place]/AT, EOM-wnl, PERRLA, EACs-clear, TMs-wnl, NOSE- sl congestion, THROAT-clear & wnl. NECK:  Supple w/ fairROM; no JVD; normal carotid impulses w/o bruits; no thyromegaly or nodules palpated; no lymphadenopathy. CHEST:  Clear to P & A; without wheezes/ rales/ or rhonchi heard... HEART:  Regular Rhythm; without murmurs/ rubs/ or gallops detected... ABDOMEN:  Obese, soft, & nontender; normal bowel sounds; no organomegaly or masses detected. EXT: without deformities, mild arthritic changes; no varicose veins/ +venous insuffic/ no edema. NEURO:  CN's intact; no focal neuro deficits... DERM:  No lesions noted; no rash etc...  RADIOLOGY DATA:  Reviewed in the EPIC EMR & discussed w/ the patient...  LABORATORY DATA:  Reviewed in the  EPIC EMR & discussed w/ the patient...   Assessment & Plan:    COPD>  He says he has cut back on smoking, & gained wt to prove it! continue Advair, Mucinex prn, wt reduction/ exercise/ etc...  HBP>  Controlled on meds + diet & exercise program...  CAD>  On ASA/ off Plavix & stable w/o angina, etc...  LIPIDS>  Improved his Simva20/ FishOil/ & wine daily (states DrBrodie told him to do this)...  DM>  Labs stable but he'll need meds soon if he doesn't get the wt down!  GI> Hx Gastritis, Polpyps>  On Ranitadine w/o symptoms & f/u colon due from DrStark in 2014...  GU>  He has BPH w/ LTOS, and TCCa of bladder, followed by [name] & pt reports surveillence cysto 1/14 was neg...   Patient's Medications  New Prescriptions   No medications on file  Previous Medications   ASPIRIN 81 MG TABLET    Take 81 mg by mouth daily.     FLAXSEED, LINSEED, (FLAXSEED OIL) 1000 MG CAPS    Take 1 capsule by mouth daily.     IBUPROFEN (ADVIL,MOTRIN) 600 MG TABLET    Take 1 tablet (600 mg total) by mouth daily as needed for pain.   MULTIPLE VITAMIN (MULTIVITAMIN) CAPSULE    Take 1 capsule by mouth daily.     MULTIPLE VITAMINS-MINERALS (OCUVITE ADULT 50+ PO)    Take 1 tablet by mouth 2 (two) times daily.     OMEGA-3 FATTY ACIDS (FISH OIL) 1200 MG CAPS    Take 1 capsule by mouth daily.    Modified Medications   Modified Medication Previous Medication   ALPRAZOLAM (XANAX) 0.5 MG TABLET ALPRAZolam (XANAX) 0.5 MG tablet      TAKE ONE TABLET BY MOUTH THREE TIMES DAILY AS NEEDED    TAKE ONE TABLET BY MOUTH THREE TIMES DAILY AS NEEDED   AMLODIPINE (NORVASC) 10 MG TABLET amLODipine (NORVASC) 10 MG tablet      TAKE 1 TABLET (10 MG TOTAL) BY MOUTH DAILY.    TAKE 1 TABLET (10 MG TOTAL) BY MOUTH DAILY.   FLUTICASONE-SALMETEROL (ADVAIR DISKUS) 250-50 MCG/DOSE AEPB Fluticasone-Salmeterol (ADVAIR DISKUS) 250-50 MCG/DOSE AEPB      Inhale 1 puff into the lungs every 12 (twelve) hours.    Inhale 1 puff into the lungs  every 12 (twelve) hours.   LISINOPRIL (PRINIVIL,ZESTRIL) 40 MG TABLET lisinopril (PRINIVIL,ZESTRIL) 40 MG tablet      TAKE 1 TABLET (40 MG TOTAL) BY MOUTH DAILY.    TAKE 1 TABLET (40 MG TOTAL) BY MOUTH DAILY.  METOPROLOL TARTRATE (LOPRESSOR) 25 MG TABLET metoprolol tartrate (LOPRESSOR) 25 MG tablet      TAKE TWO TABLETS BY MOUTH IN THE MORNING AND ONE TABLET IN THE EVENING    TAKE TWO TABLETS BY MOUTH IN THE MORNING AND ONE TABLET IN THE EVENING   NITROGLYCERIN (NITROSTAT) 0.4 MG SL TABLET nitroGLYCERIN (NITROSTAT) 0.4 MG SL tablet      Place 1 tablet (0.4 mg total) under the tongue every 5 (five) minutes as needed.    Place 1 tablet (0.4 mg total) under the tongue every 5 (five) minutes as needed.   SIMVASTATIN (ZOCOR) 20 MG TABLET simvastatin (ZOCOR) 20 MG tablet      TAKE 1 TABLET (20 MG TOTAL) BY MOUTH AT BEDTIME.    TAKE 1 TABLET (20 MG TOTAL) BY MOUTH AT BEDTIME.  Discontinued Medications   No medications on file

## 2013-12-05 NOTE — Patient Instructions (Signed)
Today we updated your med list in our EPIC system...    Continue your current medications the same...  We refilled your meds per request...  Keep up the good work w/ diet, exercise, & smoking cessation efforts!!!  Call for any questions...  Let's plan a follow up visit in 55mo, sooner if needed for problems.Marland KitchenMarland Kitchen

## 2013-12-18 DIAGNOSIS — Z8551 Personal history of malignant neoplasm of bladder: Secondary | ICD-10-CM | POA: Diagnosis not present

## 2013-12-18 DIAGNOSIS — N35919 Unspecified urethral stricture, male, unspecified site: Secondary | ICD-10-CM | POA: Diagnosis not present

## 2014-02-13 DIAGNOSIS — Z1331 Encounter for screening for depression: Secondary | ICD-10-CM | POA: Diagnosis not present

## 2014-02-13 DIAGNOSIS — I10 Essential (primary) hypertension: Secondary | ICD-10-CM | POA: Diagnosis not present

## 2014-02-13 DIAGNOSIS — E669 Obesity, unspecified: Secondary | ICD-10-CM | POA: Diagnosis not present

## 2014-02-13 DIAGNOSIS — Z9861 Coronary angioplasty status: Secondary | ICD-10-CM | POA: Diagnosis not present

## 2014-02-13 DIAGNOSIS — Z8551 Personal history of malignant neoplasm of bladder: Secondary | ICD-10-CM | POA: Diagnosis not present

## 2014-02-13 DIAGNOSIS — F17200 Nicotine dependence, unspecified, uncomplicated: Secondary | ICD-10-CM | POA: Diagnosis not present

## 2014-02-13 DIAGNOSIS — E785 Hyperlipidemia, unspecified: Secondary | ICD-10-CM | POA: Diagnosis not present

## 2014-02-13 DIAGNOSIS — J449 Chronic obstructive pulmonary disease, unspecified: Secondary | ICD-10-CM | POA: Diagnosis not present

## 2014-03-06 ENCOUNTER — Ambulatory Visit (INDEPENDENT_AMBULATORY_CARE_PROVIDER_SITE_OTHER): Payer: Medicare Other | Admitting: Cardiovascular Disease

## 2014-03-06 ENCOUNTER — Encounter: Payer: Self-pay | Admitting: Cardiovascular Disease

## 2014-03-06 VITALS — BP 140/80 | HR 75 | Ht 67.0 in | Wt 214.0 lb

## 2014-03-06 DIAGNOSIS — I2510 Atherosclerotic heart disease of native coronary artery without angina pectoris: Secondary | ICD-10-CM | POA: Diagnosis not present

## 2014-03-06 NOTE — Progress Notes (Signed)
HPI:  68 year old gentleman presenting for followup of coronary artery disease. He had a remote inferior MI in 1995. He has undergone multiple PCI procedures, most recently in 2009 when he was treated with overlapping drug-eluting stents in the right coronary artery. His last nuclear stress test in 2013 was low risk.  The patient is doing okay from a cardiac perspective. He denies chest pain, chest pressure, or shortness of breath. 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 He continues to smoke and has struggled with quitting. He is down to about one half pack per day. He is using nicotine gum. His weight is down a little better from previous visits.  Outpatient Encounter Prescriptions as of 03/06/2014  Medication Sig  . ALPRAZolam (XANAX) 0.5 MG tablet TAKE ONE TABLET BY MOUTH THREE TIMES DAILY AS NEEDED  . amLODipine (NORVASC) 10 MG tablet TAKE 1 TABLET (10 MG TOTAL) BY MOUTH DAILY.  [name] aspirin 81 MG tablet Take 81 mg by mouth daily.    . Flaxseed, Linseed, (FLAXSEED OIL) 1000 MG CAPS Take 1 capsule by mouth daily.    . Fluticasone-Salmeterol (ADVAIR DISKUS) 250-50 MCG/DOSE AEPB Inhale 1 puff into the lungs every 12 (twelve) hours.  [name] ibuprofen (ADVIL,MOTRIN) 600 MG tablet Take 1 tablet (600 mg total) by mouth daily as needed for pain.  [name] lisinopril (PRINIVIL,ZESTRIL) 40 MG tablet TAKE 1 TABLET (40 MG TOTAL) BY MOUTH DAILY.  . metoprolol tartrate (LOPRESSOR) 25 MG tablet TAKE TWO TABLETS BY MOUTH IN THE MORNING AND ONE TABLET IN THE EVENING  . Multiple Vitamin (MULTIVITAMIN) capsule Take 1 capsule by mouth daily.    . Multiple Vitamins-Minerals (OCUVITE ADULT 50+ PO) Take 1 tablet by mouth 2 (two) times daily.    . nitroGLYCERIN (NITROSTAT) 0.4 MG SL tablet Place 1 tablet (0.4 mg total) under the tongue every 5 (five) minutes as needed.  . Omega-3 Fatty Acids (FISH OIL) 1200 MG CAPS Take 1 capsule by mouth daily.    . simvastatin (ZOCOR) 20 MG tablet TAKE 1 TABLET (20 MG TOTAL)  BY MOUTH AT BEDTIME.    Allergies  Allergen Reactions  . Dextromethorphan-Guaifenesin     REACTION: GI upset    Past Medical History  Diagnosis Date  . History of sinusitis   . Epistaxis   . COPD (chronic obstructive pulmonary disease)   . Cigarette smoker   . Hypertension   . CAD (coronary artery disease)   . Hypercholesteremia   . Borderline diabetes mellitus   . Obesity   . History of colonic polyps 2004    hyperplastic   . DJD (degenerative joint disease)   . Anxiety   . Bladder cancer 2011    ROS: Negative except as per HPI  BP 140/80  Pulse 75  Ht 5\' 7"  (1.702 m)  Wt 214 lb (97.07 kg)  BMI 33.51 kg/m2  PHYSICAL EXAM: Pt is alert and oriented, pleasant obese male in NAD HEENT: normal Neck: JVP - normal, carotids 2+= without bruits Lungs: CTA bilaterally CV: RRR without murmur or gallop Abd: soft, NT, Positive BS, obese Ext: no C/C/E, distal pulses intact and equal Skin: warm/dry no rash  EKG:  Normal sinus rhythm 75 beats per minute, within normal limits.  ASSESSMENT AND PLAN: 1. Coronary artery disease, native vessel. The patient is stable without symptoms of angina. We reviewed the importance of complete tobacco cessation as well as dietary modification and weight loss. The patient understands. He is considering a nicotine  patch. He will continue on his current medical program without changes and I will see him back in one year.  2. Hypertension. Blood pressure is controlled on amlodipine, lisinopril, and metoprolol.  3. Hyperlipidemia. He remains on simvastatin. Again, lifestyle modification was reviewed.  Sherren Mocha 03/06/2014 12:47 PM

## 2014-03-06 NOTE — Patient Instructions (Signed)
Your physician wants you to follow-up in: 1 YEAR with Dr Cooper.  You will receive a reminder letter in the mail two months in advance. If you don't receive a letter, please call our office to schedule the follow-up appointment.  Your physician recommends that you continue on your current medications as directed. Please refer to the Current Medication list given to you today.  

## 2014-06-04 ENCOUNTER — Ambulatory Visit (INDEPENDENT_AMBULATORY_CARE_PROVIDER_SITE_OTHER): Payer: Medicare Other | Admitting: Pulmonary Disease

## 2014-06-04 ENCOUNTER — Encounter: Payer: Self-pay | Admitting: Pulmonary Disease

## 2014-06-04 ENCOUNTER — Ambulatory Visit (INDEPENDENT_AMBULATORY_CARE_PROVIDER_SITE_OTHER)
Admission: RE | Admit: 2014-06-04 | Discharge: 2014-06-04 | Disposition: A | Discharge status: Home / Self Care | Payer: Medicare Other | Source: Ambulatory Visit | Attending: Pulmonary Disease | Admitting: Pulmonary Disease

## 2014-06-04 VITALS — BP 122/60 | HR 76 | Temp 97.7°F | Ht 65.0 in | Wt 215.6 lb

## 2014-06-04 DIAGNOSIS — I2510 Atherosclerotic heart disease of native coronary artery without angina pectoris: Secondary | ICD-10-CM | POA: Diagnosis not present

## 2014-06-04 DIAGNOSIS — R7309 Other abnormal glucose: Secondary | ICD-10-CM

## 2014-06-04 DIAGNOSIS — F17200 Nicotine dependence, unspecified, uncomplicated: Secondary | ICD-10-CM | POA: Diagnosis not present

## 2014-06-04 DIAGNOSIS — I10 Essential (primary) hypertension: Secondary | ICD-10-CM

## 2014-06-04 DIAGNOSIS — E7800 Pure hypercholesterolemia, unspecified: Secondary | ICD-10-CM

## 2014-06-04 DIAGNOSIS — J449 Chronic obstructive pulmonary disease, unspecified: Secondary | ICD-10-CM

## 2014-06-04 DIAGNOSIS — J4489 Other specified chronic obstructive pulmonary disease: Secondary | ICD-10-CM

## 2014-06-04 NOTE — Patient Instructions (Signed)
Today we updated your med list in our EPIC system...    Continue your current medications the same...  Continue your ADVAIR twice daily...    You absolutely MUST quit the smoking!!!       Please let me know if you want some help w/ smoking cessation...  Today we did your follow up CXR and a Pulmonary function test...    We will contact you w/ the results when available...   Keep up the good work w/ DIET & EXERCISE...     The goal is to lose another 15-20 lbs...  Call for any questions...  Let's plan a follow up visit in 12mo, sooner if needed for problems.Marland KitchenMarland Kitchen

## 2014-06-04 NOTE — Progress Notes (Signed)
Subjective:    Patient ID: [name], male    DOB: [birth year], 68 y.o.   MRN: [id number]  HPI 68 y/o WM here for a follow up visit and on-going management of mult med problems...   ~  October 19, 2012:  63mo ROV & [name] reports stable, proud that he has decreased his smoking further to 1/4 ppd, encouraged to quit... We reviewed the following problems during today's visit>>    COPD, smoker> on Advair250; still smoking but decr to 1/4ppd; min cough, sm amt beige phlegm w/o blood, denies SOB but sedentary x yard work...    HBP> on Metop25-3/d, Amlod10, Lisin40; BP=122/82 & he denies CP, palpit, SOB, edema...    CAD> on ASA81; followed by [name] & follow up due; denies angina etc but too sedentary; his 24 y/o brother died recently w/ AAA rupture...    Chol> on Simva20; FLP 6/13 showed TChol 130, TG 102, HDL 33, LDL 77     DM> on diet alone; we reviewed low carb diet, exercise program, and wt reduction...    Obese> wt=219# which is up 8# despite his attempts at diet, exercise    GI- Gastritis, polyps> prev on Zantac; last colon 2004 w/ hyperplastic polyp removed & f/u due 2014...    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    DJD> on OTC analgesics as needed...    Anxiety> on Alpraz0.5mg  prn... We reviewed prob list, meds, xrays and labs> see below for updates >> he received 2013 Flu vaccine 11/13...  ~  June 05, 2013:  53mo ROV and [place] has gained 11# having stopped smoking in the interval, states his last cig was 6/22- now we have to work on his weight;  We reviewed the following medical problems during today's office visit >>     COPD, ex-smoker> on Advair250; states he quit smoking 6/14; min cough, sm amt beige phlegm w/o blood, denies SOB but sedentary x yard work...    HBP> on Metop25-3/d, Amlod10, Lisin40; BP=130/78 & he denies CP, palpit, SOB, edema...    CAD> on ASA81; followed by [name] &  seen 3/14> CAD, IWMI 1995, mult PCIs, last Myoview 2013 was low risk; too sedentary, no changes made; 54 y/o brother died recently w/ AAA rupture...    Chol> on Simva20; FLP 7/14 showed TChol 158, TG 145, HDL 33, LDL 96; needs better diet & wt reduction or incr dose next time...     DM> on diet alone; LABS 7/14 showed BS= 120, A1c= 6.7; we reviewed low carb diet, exercise program, and wt reduction...    Obese> wt=230# which is up 11# despite his attempts at diet, exercise!    GI- Gastritis, polyps> prev on Zantac; last colon 2004 w/ hyperplastic polyp removed & f/u due 2014...    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    DJD> on OTC analgesics as needed...    Anxiety> on Alpraz0.5mg  prn... We reviewed prob list, meds, xrays and labs> see below for updates >>   CXR 7/14 showed COPD/ emphysema, incr markings and scarring at the bases, NAD...  LABS 7/14:  FLP- ok on Simva20;  Chems- ok x BS=120 A1c=6.7;  CBC- wnl;  TSH=1.26;  PSA=0.41...   ~  December 05, 2013:  5mo ROV & [name] says  he's decr his smoking from Prev 3-4ppd down to 3 cig/d;  His weight is unchanged at 229# w/ BMI=36...    He remains on Advair250> states breathing is ok, at baseline CAT score=12; baseline cough, sput, no hemoptysis, stable DOE, no edema...    BP remains stable on Metop25-2AM & 1PM, Amlod5, Lisin40; BP= 120/72 and he denies CP, palpit, ch in SOB, edema...    Chol is treated w/ Simva20> last FLP 7/14 was ok x low HDL=33, weight = 229# (no change) & we reviewed diet, exercise, wt reduction strategies...    Borderline DM treated w/ diet alone 7 he realizes that he must lose wt, limit carbs, or face DM meds in the future... We reviewed prob list, meds, xrays and labs> see below for updates >> he received the 2014 flu vaccine 10/14 and requests refills today...   ~  June 04, 2014:  25mo ROV & [name] has established Primary Care  follow up w/ [name] at [facility]>>     Here for a 80mo pulmonary follow up visit> he has Stage3 COPD (see PFTs below) on Advair250Bid but still smoking & while he claimed to have decreased to 3cig/d last visit, he is now back up to 1/2-1ppd again (recall hx of former 3-4ppd habit);  Furthermore he anounces that he is in the donut hole and needs samples of his Advair & declines to consider additional medications (ie- Spiriva) due to the cost issues;  He notes mild cough, sm amt beige-yellow sput production, no hemoptysis; he denies CP, palpit, or edema; he notes SOB/ DOE w/ exercise/ activity in the hot weather but he maintains 3 yards (riding mower) and has no issues w/ ADLs...     He had f/u visit w/ [name] for Cards 4/15> known CAD w/ IWMI 1995, mult PCIs over the yrs and last 2009 w/ overlapping stents in RCA, last Myoview was 2013 & low risk; asked to quit smoking & lose weight; meds include: ASA81, Metop25Bid, Amlod10, Lisin40, and no changes made...     He is maintained on Simva20 + FishOil for his Lipids and he is due for yearly FLP which he will get w/ [name] in August...    He had f/u visit w/ [name], Urology 2/15> Hx Bladder cancer 2011 w/ TURBT followed by BCG and surveillance cysto 2/15 was neg; BPH w/ LTOS and CC is nocturia x 2-3; last PSA was 0.41 in [id number]...    He is under some increased stress w/ wife Dx w/ skin cancer (?melanoma) for which she has had several surgeries; he has Xanax 0.5mg  for prn use... We reviewed prob list, meds, xrays and labs> see below for updates >>   CXR 7/15 showed underlying COPD, mild bibasilar fibrosis, norm heart size, DJD in spine, NAD...   PFT 7/15 showed> FVC=3.18 (78%), FEV1=1.22 (38%), %1sec=38; mid-flows=13% predicted... This is c/w GOLD Stage3 COPD and we discussed this... REC>  He is rec to quit all smoking immediately; he has been using the nicotine gum & rec to try patches, offered Wellbutrin, he refuses Chantix; needs to  continue ADVAIR250Bid & samples givem; he declines to start [place] at this time due to cost & donut hole (we will address this again on return); reminded to use MUCINEX 600mg - 1to2 Bid w/ fluids, and OK to use OTC DELSYM as needed for cough...            Problem List:  Hx of SINUSITIS (ICD-473.9) - off Nasonex now & recent Rx w/ Augmentin/  Saline... notes occas sinus congestion, drainage, sl HA, etc... he knows that he needs to quit smoking! ~  10/10: Adm for epistaxis w/ eval by DrWolicki- CXR= NAD, labs OK, rec for saline lavage- nose bleed resolved & disch home (no recurrence so far).  COPD (ICD-496) - on ADVAIR 250Bid; STILL SMOKES CIGARETTES & he's refused Chantix...  ~  He had hemoptysis 6/99 without lesion on CXR, CTChest, or Bronchoscopy... ~  Adm 10/10 w/ epistaxsis & eval DrWolicki as noted> he wanted to do Sleep Study but pt cancelled it & declined to resched. 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 ~  CXR 5/12 showed ectatic calcif & tort Aorta, COPD w/ incr markings ar bases, NAD, +degen spondylosis. ~  CXR 6/13 showed COPD/ emphysema, scarring in the lower lobes, otherw clear lungs, DJD in spine... ~  CXR 7/14 showed COPD/ emphysema, incr markings and scarring at the bases, NAD. ~  1/15: [name] says he's decr his smoking from prev 3-4ppd down to 3 cig/d;  He remains on Advair250> states breathing is ok, at baseline CAT score=12; baseline cough, sput, no hemoptysis, stable DOE, no edema... ~  CXR 7/15 showed underlying COPD, mild bibasilar fibrosis, norm heart size, DJD in spine, NAD...  ~  PFT 7/15 showed> FVC=3.18 (78%), FEV1=1.22 (38%), %1sec=38; mid-flows=13% predicted... This is c/w GOLD Stage3 COPD and we discussed this> He is rec to quit all smoking immediately; he has been using the nicotine gum & rec to try patches, offered Wellbutrin, he refuses Chantix; needs to continue ADVAIR250Bid & samples givem; he declines to start [place] at this time due to cost & donut  hole (we will address this again on return); reminded to use MUCINEX 600mg - 1to2 Bid w/ fluids, and OK to use OTC DELSYM as needed for cough...      HE HAS ESTABLISHED PRIMARY CARE w/ [name], [facility]>>   HYPERTENSION (ICD-401.9) - controlled on METOPROLOL 25mg Tid,  NORVASC 10mg /d, LISINOPRIL 40mg /d...   ~  11/11:  BP=122/70, not checking BP's at home... denies HA, fatigue, visual changes, CP, palipit, dizziness, syncope, dyspnea, edema, etc. ~  5/12:  BP= 120/72, pt very pleased w/ his wt loss & improved exercise ability. ~  6/13:  BP= 122/64 & feeling well w/o CP, palpit, SOB, edema, etc... ~  12/13:  BP=122/82 & he denies CP, palpit, SOB, edema... ~  7/14: on Metop25-3/d, Amlod10, Lisin40; BP=130/78 & he denies CP, palpit, SOB, edema ~  1/15: BP remains stable on Metop25-2AM & 1PM, Amlod5, Lisin40; BP= 120/72 and he denies CP, palpit, ch in SOB, edema.  CORONARY ARTERY DISEASE (ICD-414.00) - ASA 81mg /d & off Plavix as of 2/13 officially as he wasn't taking it regularly anyway. ~  Hx prev IWMI and PTCA/ stent in RCA by DrBrodie 12/95 & 5/96...  ~  Myoview 4/08 abnormal w/ inferior ischemia and subseq cath revealing restenoses in RCA- s/p repeat PTCA/ stents...  ~  f/u Myoview 9/08 w/ prev infer infarct, no ischemia, EF=56%... ~  9/11:  pre op cardiac eval DrBrodie> OK to hold ASA/ Plavix for bladder surg. 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 ~  2/13:  He had f/u [name] w/ Myoview showing a fixed defect in infer wall, no ischemia, EF=58%; pt wasn't taking Plavix, therefore stopped in favor of ASA81mg /d... ~  3/14:  He had f/u [name]> CAD, IWMI 1995, mult PCIs, last  Myoview 2013 was low risk; too sedentary, no changes made; 86 y/o brother died recently w/ AAA rupture... ~  EKG 3/14 showed NSR, rate65, WNL, NAD...  HYPERCHOLESTEROLEMIA (Mixed Hyperlipidemia) - prev on Simva80 but DrBrodie decr  him to SIMVASTATIN 20mg /d + Fish Oil & Red Wine qd... ~  [place] 9/08 on Simva40 showed TChol 151, TG 106, HDL 23, LDL 107 ~  FLP 8/09 on Simva80 showed TChol 142, TG 135, HDL 28, LDL 87 ~  FLP 6/10 on Simva80 showed TChol 151, TG 117, HDL 31, LDL 97 ~  FLP in hosp 10/10 showed TChol 128, TG 110, HDL 26, LDL 80 ~  FLP 5/11 on Simva80 showed TChol 144, TG 215, HDL 26, LDL 89 ~  9/11:  DrBrodie decr him to Simva20... we discussed the need for better diet & wt reduction. ~  FLP 5/12 on Simva20 showed TChol 162, TG 159, HDL 32, LDL 98 ~  FLP 6/13 on Simva20 showed TChol 130, TG 102, HDL 33, LDL 77... rec to incr exercise program & get wt down! ~  FLP 7/14 on Simva20 showed TChol 158, TG 145, HDL 33, LDL 96... He has gained way too much wt 7 must get on diet!  DIABETES MELLITUS, BORDERLINE (ICD-790.29) - on diet alone... ~  labs 9/08 showed BS=100, HgA1c=6.1 ~  labs 8/09 showed BS= 107, A1c= 6.2 ~  labs 6/10 showed BS= 108, A1c= 6.2 ~  lab 5/11 (wt=244#) showed BS= 121. A1c= 6.7.[name]. he was told "get wt down, or start meds". ~  Labs 5/12 showed BS= 109, A1c= 6.1 ~  Labs 6/13 showed BS= 114, & he needs to lose the weight... ~  Labs 7/14 on diet alone showed BS= 120, A1c= 6.7  OBESITY (ICD-278.00) - weight was down as low as 202# in 2/09, and steadily incr to 244# by 5/11... ~  weight 5/11 = 244#.[name].  we reviewed diet + exercise necessary to lose weight... ~  weight 11/11 = 217# (after dx & rx for bladder cancer) ~  Weight 5/12 = 214# but says it's 192# at home... ~  Weight 6/13 = 211# but knows it's better since his pant/ belt is down to 36" (under his roll). ~  Weight 7/14 = 230# but he has quit smoking he says, now must lose the weight... ~  Weight 1/15 = 229#  GASTRITIS (ICD-535.50) - had EGD 10/05 showing gastritis... on RANITADINE 300mg /d due to his Plavix Rx, off prev Omep.  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 ~  4/12:  Pt reported f/u cysto was neg, doing well... ~  10/12:  Note from [name] reviewed> cysto was neg, no recurrent tumors... ~  4/13:  Note from [name] reviewed> neg f/u cysto w/o recurrent TCCa... He wants to continue Q78mo cystoscopies... ~  1/14:  He had f/u w/ [name]> Hx TCCa bladder, s/p TURBT 9/11, given BCG 11/11-12/11 and f/u Cysto 1/12 was neg; repeat cysto 1/14 was neg as well...  DEGENERATIVE JOINT DISEASE (ICD-715.90) - He has c/o right shoulder pain, hip pain, knee pain, and LBP> Rx ADVIL w/ some benefit... offered Ortho  referral & he will decide.  ANXIETY (ICD-300.00) - on ALPRAZOLAM 0.5mg  Prn...   Past Surgical History  Procedure Laterality Date  . Cataract surgery/sub posterior vitrectomy in left eye  1996    Dr. Zadie Rhine  . Cataract surgery  septemeber 2013  . [facility]  [phone]    Outpatient Encounter Prescriptions as of 06/04/2014  Medication Sig  . ALPRAZolam (XANAX) 0.5 MG tablet TAKE ONE TABLET BY MOUTH THREE TIMES DAILY AS NEEDED  . amLODipine (NORVASC) 10 MG tablet TAKE 1 TABLET (10 MG TOTAL) BY MOUTH DAILY.  [name] aspirin 81 MG tablet Take 81 mg by mouth daily.    . Flaxseed, Linseed, (FLAXSEED OIL) 1000 MG CAPS Take 1 capsule by mouth daily.    . Fluticasone-Salmeterol (ADVAIR DISKUS) 250-50 MCG/DOSE AEPB Inhale 1 puff into the lungs every 12 (twelve) hours.  [name] ibuprofen (ADVIL,MOTRIN) 600 MG tablet Take 1 tablet (600 mg total) by mouth daily as needed for pain.  [name] lisinopril (PRINIVIL,ZESTRIL) 40 MG tablet TAKE 1 TABLET (40 MG TOTAL) BY MOUTH DAILY.  . metoprolol tartrate (LOPRESSOR) 25 MG tablet TAKE TWO TABLETS BY MOUTH IN THE MORNING AND ONE TABLET  IN THE EVENING  . Multiple Vitamin (MULTIVITAMIN) capsule Take 1 capsule by mouth daily.    . Multiple Vitamins-Minerals (OCUVITE ADULT 50+ PO) Take 1 tablet by mouth 2 (two) times daily.    . nitroGLYCERIN (NITROSTAT) 0.4 MG SL tablet Place 1 tablet (0.4 mg total) under the tongue every 5 (five) minutes as needed.  . Omega-3 Fatty Acids (FISH OIL) 1200 MG CAPS Take 1 capsule by mouth daily.    . simvastatin (ZOCOR) 20 MG tablet TAKE 1 TABLET (20 MG TOTAL) BY MOUTH AT BEDTIME.    Allergies  Allergen Reactions  . Dextromethorphan-Guaifenesin     REACTION: GI upset    Current Medications, Allergies, Past Medical History, Past Surgical History, Family History, and Social History were reviewed in [employer] record.    Review of Systems        See HPI - all other systems neg except as noted... The patient complains of some dyspnea on exertion.  The patient denies anorexia, fever, weight loss, weight gain, vision loss, decreased hearing, hoarseness, chest pain, syncope, peripheral edema, prolonged cough, headaches, hemoptysis, abdominal pain, melena, hematochezia, severe indigestion/heartburn, hematuria, incontinence, muscle weakness, suspicious skin lesions, transient blindness, difficulty walking, depression, unusual weight change, abnormal bleeding, enlarged lymph nodes, and angioedema.     Objective:   Physical Exam    WD, Obese, 68 y/o WM in NAD... Vital Signs:  Reviewed... GENERAL:  Alert & oriented; pleasant & cooperative... HEENT:  [place]/AT, EOM-wnl, PERRLA, EACs-clear, TMs-wnl, NOSE- sl congestion, THROAT-clear & wnl. NECK:  Supple w/ fairROM; no JVD; normal carotid impulses w/o bruits; no thyromegaly or nodules palpated; no lymphadenopathy. CHEST:  Clear to P & A; without wheezes/ rales/ or rhonchi heard... HEART:  Regular Rhythm; without murmurs/ rubs/ or gallops detected... ABDOMEN:  Obese, soft, & nontender; normal bowel sounds; no organomegaly or masses  detected. EXT: without deformities, mild arthritic changes; no varicose veins/ +venous insuffic/ no edema. NEURO:  CN's intact; no focal neuro deficits... DERM:  No lesions noted; no rash etc...  RADIOLOGY DATA:  Reviewed in the EPIC EMR & discussed w/ the patient...  LABORATORY DATA:  Reviewed in the EPIC EMR & discussed w/ the patient...   Assessment & Plan:    COPD>  He has GOLD Stage3 COPD, still smoking cigaretttes and begged to quit; continue  ADVAIR250Bid & needs Spiriva added but he declines due to cost & donut hole; I reminded him that if he quit smoking he would have the $$ for his meds and some left over!   Primary Care followed by [name]>> HBP>  .. CAD>   Followed by [name] LIPIDS>   DM>   GI> Hx Gastritis, Polpyps>   GU>   Followed by [name]   Patient's Medications  New Prescriptions   No medications on file  Previous Medications   ALPRAZOLAM (XANAX) 0.5 MG TABLET    TAKE ONE TABLET BY MOUTH THREE TIMES DAILY AS NEEDED   AMLODIPINE (NORVASC) 10 MG TABLET    TAKE 1 TABLET (10 MG TOTAL) BY MOUTH DAILY.   ASPIRIN 81 MG TABLET    Take 81 mg by mouth daily.     FLAXSEED, LINSEED, (FLAXSEED OIL) 1000 MG CAPS    Take 1 capsule by mouth daily.     FLUTICASONE-SALMETEROL (ADVAIR DISKUS) 250-50 MCG/DOSE AEPB    Inhale 1 puff into the lungs every 12 (twelve) hours.   IBUPROFEN (ADVIL,MOTRIN) 600 MG TABLET    Take 1 tablet (600 mg total) by mouth daily as needed for pain.   LISINOPRIL (PRINIVIL,ZESTRIL) 40 MG TABLET    TAKE 1 TABLET (40 MG TOTAL) BY MOUTH DAILY.   METOPROLOL TARTRATE (LOPRESSOR) 25 MG TABLET    TAKE TWO TABLETS BY MOUTH IN THE MORNING AND ONE TABLET IN THE EVENING   MULTIPLE VITAMIN (MULTIVITAMIN) CAPSULE    Take 1 capsule by mouth daily.     MULTIPLE VITAMINS-MINERALS (OCUVITE ADULT 50+ PO)    Take 1 tablet by mouth 2 (two) times daily.     NITROGLYCERIN (NITROSTAT) 0.4 MG SL TABLET    Place 1 tablet (0.4 mg total) under the tongue every 5 (five)  minutes as needed.   OMEGA-3 FATTY ACIDS (FISH OIL) 1200 MG CAPS    Take 1 capsule by mouth daily.     SIMVASTATIN (ZOCOR) 20 MG TABLET    TAKE 1 TABLET (20 MG TOTAL) BY MOUTH AT BEDTIME.  Modified Medications   No medications on file  Discontinued Medications   No medications on file

## 2014-06-18 DIAGNOSIS — N35919 Unspecified urethral stricture, male, unspecified site: Secondary | ICD-10-CM | POA: Diagnosis not present

## 2014-06-18 DIAGNOSIS — Z8551 Personal history of malignant neoplasm of bladder: Secondary | ICD-10-CM | POA: Diagnosis not present

## 2014-06-26 DIAGNOSIS — I10 Essential (primary) hypertension: Secondary | ICD-10-CM | POA: Diagnosis not present

## 2014-06-26 DIAGNOSIS — Z125 Encounter for screening for malignant neoplasm of prostate: Secondary | ICD-10-CM | POA: Diagnosis not present

## 2014-06-26 DIAGNOSIS — E785 Hyperlipidemia, unspecified: Secondary | ICD-10-CM | POA: Diagnosis not present

## 2014-07-02 ENCOUNTER — Other Ambulatory Visit: Payer: Self-pay | Admitting: Pulmonary Disease

## 2014-07-03 DIAGNOSIS — Z23 Encounter for immunization: Secondary | ICD-10-CM | POA: Diagnosis not present

## 2014-07-03 DIAGNOSIS — E785 Hyperlipidemia, unspecified: Secondary | ICD-10-CM | POA: Diagnosis not present

## 2014-07-03 DIAGNOSIS — I10 Essential (primary) hypertension: Secondary | ICD-10-CM | POA: Diagnosis not present

## 2014-07-03 DIAGNOSIS — E669 Obesity, unspecified: Secondary | ICD-10-CM | POA: Diagnosis not present

## 2014-07-03 DIAGNOSIS — Z9861 Coronary angioplasty status: Secondary | ICD-10-CM | POA: Diagnosis not present

## 2014-07-03 DIAGNOSIS — Z8551 Personal history of malignant neoplasm of bladder: Secondary | ICD-10-CM | POA: Diagnosis not present

## 2014-07-03 DIAGNOSIS — Z0000 Encounter for general adult medical examination without abnormal findings: Secondary | ICD-10-CM | POA: Diagnosis not present

## 2014-07-03 DIAGNOSIS — J449 Chronic obstructive pulmonary disease, unspecified: Secondary | ICD-10-CM | POA: Diagnosis not present

## 2014-07-04 DIAGNOSIS — Z136 Encounter for screening for cardiovascular disorders: Secondary | ICD-10-CM | POA: Diagnosis not present

## 2014-07-05 ENCOUNTER — Other Ambulatory Visit: Payer: Self-pay | Admitting: Pulmonary Disease

## 2014-08-07 DIAGNOSIS — Z23 Encounter for immunization: Secondary | ICD-10-CM | POA: Diagnosis not present

## 2014-12-05 ENCOUNTER — Encounter: Payer: Self-pay | Admitting: Pulmonary Disease

## 2014-12-05 ENCOUNTER — Ambulatory Visit (INDEPENDENT_AMBULATORY_CARE_PROVIDER_SITE_OTHER): Payer: Medicare Other | Admitting: Pulmonary Disease

## 2014-12-05 ENCOUNTER — Other Ambulatory Visit: Payer: Self-pay | Admitting: Pulmonary Disease

## 2014-12-05 VITALS — BP 124/70 | HR 76

## 2014-12-05 DIAGNOSIS — E7800 Pure hypercholesterolemia, unspecified: Secondary | ICD-10-CM

## 2014-12-05 DIAGNOSIS — E669 Obesity, unspecified: Secondary | ICD-10-CM | POA: Diagnosis not present

## 2014-12-05 DIAGNOSIS — J0100 Acute maxillary sinusitis, unspecified: Secondary | ICD-10-CM | POA: Diagnosis not present

## 2014-12-05 DIAGNOSIS — J449 Chronic obstructive pulmonary disease, unspecified: Secondary | ICD-10-CM | POA: Diagnosis not present

## 2014-12-05 DIAGNOSIS — R7301 Impaired fasting glucose: Secondary | ICD-10-CM

## 2014-12-05 DIAGNOSIS — I10 Essential (primary) hypertension: Secondary | ICD-10-CM | POA: Diagnosis not present

## 2014-12-05 MED ORDER — LISINOPRIL 40 MG PO TABS: ORAL_TABLET | ORAL | Status: DC

## 2014-12-05 MED ORDER — TIOTROPIUM BROMIDE MONOHYDRATE 18 MCG IN CAPS: 18.0000 ug | ORAL_CAPSULE | Freq: Every day | RESPIRATORY_TRACT | Status: DC

## 2014-12-05 MED ORDER — AMOXICILLIN-POT CLAVULANATE 875-125 MG PO TABS: 1.0000 | ORAL_TABLET | Freq: Two times a day (BID) | ORAL | Status: DC

## 2014-12-05 MED ORDER — SIMVASTATIN 20 MG PO TABS: ORAL_TABLET | ORAL | Status: DC

## 2014-12-05 MED ORDER — FLUTICASONE-SALMETEROL 250-50 MCG/DOSE IN AEPB: 1.0000 | INHALATION_SPRAY | Freq: Two times a day (BID) | RESPIRATORY_TRACT | Status: DC

## 2014-12-05 MED ORDER — IBUPROFEN 600 MG PO TABS: 600.0000 mg | ORAL_TABLET | Freq: Every day | ORAL | Status: DC | PRN

## 2014-12-05 MED ORDER — ALPRAZOLAM 0.5 MG PO TABS: 0.5000 mg | ORAL_TABLET | Freq: Three times a day (TID) | ORAL | Status: DC | PRN

## 2014-12-05 MED ORDER — AMLODIPINE BESYLATE 10 MG PO TABS: ORAL_TABLET | ORAL | Status: DC

## 2014-12-05 NOTE — Patient Instructions (Signed)
Today we updated your med list in our EPIC system...    Continue your current medications the same...  For your SINUSITIS>>    We wrote a new prescription for AUGMENTIN 875mg - take one tab twice daily til gone...    You should also take a probiotic like ALIGN daily while you are on the Augmentin...    You may use a SALINE nasal spray and OTC MUCINEX as needed...  Continue the [id number]- twwice daily...  Add the new SPIRIVA- inhale the contents of one cap daily...  yu need to continue working on smoking cessation!!!  Call for any questions...  Let's plan a follow up visit in 43mo at which time we will recheck your CXR & breathing test on the meds,,,

## 2014-12-05 NOTE — Progress Notes (Signed)
Subjective:    Patient ID: [name], male    DOB: 1946-03-24, 69 y.o.   MRN: [id number]  HPI 69 y/o WM here for a follow up visit and on-going management of mult med problems...  ~  SEE PREV EPIC NOTES FOR OLDER DATA >>   ~  June 05, 2013:  46mo ROV and [name] has gained 11# having stopped smoking in the interval, states his last cig was 6/22- now we have to work on his weight;  We reviewed the following medical problems during today's office visit >>     COPD, ex-smoker> on Advair250; states he quit smoking 6/14; min cough, sm amt beige phlegm w/o blood, denies SOB but sedentary x yard work...    HBP> on Metop25-3/d, Amlod10, Lisin40; BP=130/78 & he denies CP, palpit, SOB, edema...    CAD> on ASA81; followed by [name] & seen 3/14> CAD, IWMI 1995, mult PCIs, last Myoview 2013 was low risk; too sedentary, no changes made; 60 y/o brother died recently w/ AAA rupture...    Chol> on Simva20; FLP 7/14 showed TChol 158, TG 145, HDL 33, LDL 96; needs better diet & wt reduction or incr dose next time...     DM> on diet alone; LABS 7/14 showed BS= 120, A1c= 6.7; we reviewed low carb diet, exercise program, and wt reduction...    Obese> wt=230# which is up 11# despite his attempts at diet, exercise!    GI- Gastritis, polyps> prev on Zantac; last colon 2004 w/ hyperplastic polyp removed & f/u due 2014...    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    DJD> on OTC analgesics as needed...    Anxiety> on Alpraz0.5mg  prn... We reviewed prob list, meds, xrays and labs> see below for updates >>   CXR 7/14 showed COPD/ emphysema, incr markings and scarring at the bases, NAD...  LABS 7/14:  FLP- ok on Simva20;  Chems- ok x BS=120 A1c=6.7;  CBC- wnl;  TSH=1.26;  PSA=0.41...   ~  December 05, 2013:  73mo ROV & [name] says he's decr his smoking from Prev 3-4ppd down to 3 cig/d;  His weight is unchanged at 229# w/  BMI=36...    He remains on Advair250> states breathing is ok, at baseline CAT score=12; baseline cough, sput, no hemoptysis, stable DOE, no edema...    BP remains stable on Metop25-2AM & 1PM, Amlod5, Lisin40; BP= 120/72 and he denies CP, palpit, ch in SOB, edema...    Chol is treated w/ Simva20> last FLP 7/14 was ok x low HDL=33, weight = 229# (no change) & we reviewed diet, exercise, wt reduction strategies...    Borderline DM treated w/ diet alone 7 he realizes that he must lose wt, limit carbs, or face DM meds in the future... We reviewed prob list, meds, xrays and labs> see below for updates >> he received the 2014 flu vaccine 10/14 and requests refills today...   ~  June 04, 2014:  49mo ROV & [name] has established Primary Care follow up w/ [name] at [facility]>>     Here for a 54mo pulmonary follow up visit> he has Stage3 COPD (see PFTs below) on Advair250Bid but still smoking & while he claimed to have decreased to 3cig/d last visit, he is now back up to 1/2-1ppd again (recall hx of former 3-4ppd habit);  Furthermore he anounces that he is in the donut  hole and needs samples of his Advair & declines to consider additional medications (ie- Spiriva) due to the cost issues;  He notes mild cough, sm amt beige-yellow sput production, no hemoptysis; he denies CP, palpit, or edema; he notes SOB/ DOE w/ exercise/ activity in the hot weather but he maintains 3 yards (riding mower) and has no issues w/ ADLs...     He had f/u visit w/ [name] for Cards 4/15> known CAD w/ IWMI 1995, mult PCIs over the yrs and last 2009 w/ overlapping stents in RCA, last Myoview was 2013 & low risk; asked to quit smoking & lose weight; meds include: ASA81, Metop25Bid, Amlod10, Lisin40, and no changes made...     He is maintained on Simva20 + FishOil for his Lipids and he is due for yearly FLP which he will get w/ [name] in August...    He had f/u visit w/ [name], Urology 2/15> Hx Bladder cancer 2011 w/ TURBT  followed by BCG and surveillance cysto 2/15 was neg; BPH w/ LTOS and CC is nocturia x 2-3; last PSA was 0.41 in [id number]...    He is under some increased stress w/ wife Dx w/ skin cancer (?melanoma) for which she has had several surgeries; he has Xanax 0.5mg  for prn use... We reviewed prob list, meds, xrays and labs> see below for updates >>   CXR 7/15 showed underlying COPD, mild bibasilar fibrosis, norm heart size, DJD in spine, NAD...   PFT 7/15 showed> FVC=3.18 (78%), FEV1=1.22 (38%), %1sec=38; mid-flows=13% predicted... This is c/w GOLD Stage3 COPD and we discussed this... REC>  He is rec to quit all smoking immediately; he has been using the nicotine gum & rec to try patches, offered Wellbutrin, he refuses Chantix; needs to continue ADVAIR250Bid & samples givem; he declines to start [place] at this time due to cost & donut hole (we will address this again on return); reminded to use MUCINEX 600mg - 1to2 Bid w/ fluids, and OK to use OTC DELSYM as needed for cough...    ~  December 05, 2014:  48mo ROV & pulmonary recheck; [name] has continued smoking, perhaps decr slightly to 1/2ppd but unable to quite & he has gained 6# in the interim up to 221# today;  His CC is sinusitis w/ nasal congestion, drainage & green mucus plus maxillary/ frontal HAs; he denies f/c/s, notes stable cough & chest symptoms w/ DOE;  He has been taking his Advair250Bid but prev refused addition of an anticholinergic due to "the donut hole";  States his breathing is "fine, except in cold air" ( we discussed wearing a scarf in winter etc)... He has GOLD Stage3 COPD w/ FEV1=1.22 (38%predicted) done 7/15;;  Exam shows decr BS bilat w/ few scat rhonchi & mild exp wheezing esp w/ forced maneuver...     We reviewed prob list, meds, xrays and labs> see below for updates >> he is followed by [facility] for [facility] and sees him once per year... PLAN>> we discussed treating his acute max sinusitis w/ Augmentin875Bid, plus align daily &  nasal saline as needed; he knows the importance of smoking cessation but is not motivated & declines smoking cessation hekp; he will continue the Advair250Bid 7 we are adding spiriva via handihaler once per day; we plan ROV in 47mo w/ f/u CXR & PFT, call in the interim for any breathing issues....           Problem List:  Hx of SINUSITIS (ICD-473.9) - off Nasonex now & recent Rx w/  Augmentin/ Saline... notes occas sinus congestion, drainage, sl HA, etc... he knows that he needs to quit smoking! ~  10/10: Adm for epistaxis w/ eval by DrWolicki- CXR= NAD, labs OK, rec for saline lavage- nose bleed resolved & disch home (no recurrence so far). ~  1/16: he presented w/ acute max & frontal sinusitis> Rx w/ augmentin, Align, Saline, Mucinex, etc...  COPD (ICD-496) - on ADVAIR 250Bid; STILL SMOKES CIGARETTES & he's refused Chantix...  ~  He had hemoptysis 6/99 without lesion on CXR, CTChest, or Bronchoscopy... ~  Adm 10/10 w/ epistaxsis & eval DrWolicki as noted> he wanted to do Sleep Study but pt cancelled it & declined to resched. 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 ~  CXR 5/12 showed ectatic calcif & tort Aorta, COPD w/ incr markings ar bases, NAD, +degen spondylosis. ~  CXR 6/13 showed COPD/ emphysema, scarring in the lower lobes, otherw clear lungs, DJD in spine... ~  CXR 7/14 showed COPD/ emphysema, incr markings and scarring at the bases, NAD. ~  1/15: [name] says he's decr his smoking from prev 3-4ppd down to 3 cig/d;  He remains on Advair250> states breathing is ok, at baseline CAT score=12; baseline cough, sput, no hemoptysis, stable DOE, no edema... ~  CXR 7/15 showed underlying COPD, mild bibasilar fibrosis, norm heart size, DJD in spine, NAD...  ~  PFT 7/15 showed> FVC=3.18 (78%), FEV1=1.22 (38%), %1sec=38; mid-flows=13% predicted... This is c/w GOLD Stage3 COPD and we discussed this> He is rec to quit all smoking immediately; he has been using the nicotine gum & rec to try  patches, offered Wellbutrin, he refuses Chantix; needs to continue ADVAIR250Bid & samples givem; he declines to start [place] at this time due to cost & donut hole (we will address this again on return); reminded to use MUCINEX 600mg - 1to2 Bid w/ fluids, and OK to use OTC DELSYM as needed for cough...   ~  1/16: he claims stable on Advair250 but still smoking & can't vs won't quit; we decided to add Spiriva daily via handihaler...    HE HAS ESTABLISHED PRIMARY CARE w/ [name], [facility]>>   HYPERTENSION (ICD-401.9) - controlled on METOPROLOL 25mg Tid,  NORVASC 10mg /d, LISINOPRIL 40mg /d...   ~  11/11:  BP=122/70, not checking BP's at home... denies HA, fatigue, visual changes, CP, palipit, dizziness, syncope, dyspnea, edema, etc. ~  5/12:  BP= 120/72, pt very pleased w/ his wt loss & improved exercise ability. ~  6/13:  BP= 122/64 & feeling well w/o CP, palpit, SOB, edema, etc... ~  12/13:  BP=122/82 & he denies CP, palpit, SOB, edema... ~  7/14: on Metop25-3/d, Amlod10, Lisin40; BP=130/78 & he denies CP, palpit, SOB, edema ~  1/15: BP remains stable on Metop25-2AM & 1PM, Amlod5, Lisin40; BP= 120/72 and he denies CP, palpit, ch in SOB, edema.  CORONARY ARTERY DISEASE (ICD-414.00) - ASA 81mg /d & off Plavix as of 2/13 officially as he wasn't taking it regularly anyway. ~  Hx prev IWMI and PTCA/ stent in RCA by DrBrodie 12/95 & 5/96...  ~  Myoview 4/08 abnormal w/ inferior ischemia and subseq cath revealing restenoses in RCA- s/p repeat PTCA/ stents...  ~  f/u Myoview 9/08 w/ prev infer infarct, no ischemia, EF=56%... ~  9/11:  pre op cardiac eval DrBrodie> OK to hold ASA/ Plavix for bladder surg. ~  2/12: f/u [name] w/ hx CAD, MI in 95, mult PCIs last 2009> stable, on ASA/ Plavix, no changes made; EKG= NSR, WNL.Marland KitchenMarland Kitchen ~  2/13:  He had f/u [name] w/ Myoview showing a fixed defect in infer wall, no ischemia, EF=58%; pt wasn't taking Plavix, therefore stopped in favor of ASA81mg /d... ~   3/14:  He had f/u [name]> CAD, IWMI 1995, mult PCIs, last Myoview 2013 was low risk; too sedentary, no changes made; 26 y/o brother died recently w/ AAA rupture... ~  EKG 3/14 showed NSR, rate65, WNL, NAD...  HYPERCHOLESTEROLEMIA (Mixed Hyperlipidemia) - prev on Simva80 but DrBrodie decr him to SIMVASTATIN 20mg /d + Fish Oil & Red Wine qd... ~  [place] 9/08 on Simva40 showed TChol 151, TG 106, HDL 23, LDL 107 ~  FLP 8/09 on Simva80 showed TChol 142, TG 135, HDL 28, LDL 87 ~  FLP 6/10 on Simva80 showed TChol 151, TG 117, HDL 31, LDL 97 ~  FLP in hosp 10/10 showed TChol 128, TG 110, HDL 26, LDL 80 ~  FLP 5/11 on Simva80 showed TChol 144, TG 215, HDL 26, LDL 89 ~  9/11:  DrBrodie decr him to Simva20... we discussed the need for better diet & wt reduction. ~  FLP 5/12 on Simva20 showed TChol 162, TG 159, HDL 32, LDL 98 ~  FLP 6/13 on Simva20 showed TChol 130, TG 102, HDL 33, LDL 77... rec to incr exercise program & get wt down! ~  FLP 7/14 on Simva20 showed TChol 158, TG 145, HDL 33, LDL 96... He has gained way too much wt 7 must get on diet!  DIABETES MELLITUS, BORDERLINE (ICD-790.29) - on diet alone... ~  labs 9/08 showed BS=100, HgA1c=6.1 ~  labs 8/09 showed BS= 107, A1c= 6.2 ~  labs 6/10 showed BS= 108, A1c= 6.2 ~  lab 5/11 (wt=244#) showed BS= 121. A1c= 6.7.[name]. he was told "get wt down, or start meds". ~  Labs 5/12 showed BS= 109, A1c= 6.1 ~  Labs 6/13 showed BS= 114, & he needs to lose the weight... ~  Labs 7/14 on diet alone showed BS= 120, A1c= 6.7  OBESITY (ICD-278.00) - weight was down as low as 202# in 2/09, and steadily incr to 244# by 5/11... ~  weight 5/11 = 244#.[name].  we reviewed diet + exercise necessary to lose weight... ~  weight 11/11 = 217# (after dx & rx for bladder cancer) ~  Weight 5/12 = 214# but says it's 192# at home... ~  Weight 6/13 = 211# but knows it's better since his pant/ belt is down to 36" (under his roll). ~  Weight 7/14 = 230# but he has quit smoking he says,  now must lose the weight... ~  Weight 1/15 = 229#  GASTRITIS (ICD-535.50) - had EGD 10/05 showing gastritis... on RANITADINE 300mg /d due to his Plavix Rx, off prev Omep.  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 ~  4/12:  Pt reported f/u cysto was neg, doing well... ~  10/12:  Note from [name] reviewed> cysto was neg, no recurrent tumors... ~  4/13:  Note from [name] reviewed> neg f/u cysto w/o recurrent TCCa... He wants to continue Q56mo cystoscopies... ~  1/14:  He had f/u w/ [name]> Hx TCCa bladder, s/p TURBT  9/11, given BCG 11/11-12/11 and f/u Cysto 1/12 was neg; repeat cysto 1/14 was neg as well...  DEGENERATIVE JOINT DISEASE (ICD-715.90) - He has c/o right shoulder pain, hip pain, knee pain, and LBP> Rx ADVIL w/ some benefit... offered Ortho referral & he will decide.  ANXIETY (ICD-300.00) - on ALPRAZOLAM 0.5mg  Prn...   Past Surgical History  Procedure Laterality Date  . Cataract surgery/sub posterior vitrectomy in left eye  1996    Dr. Zadie Rhine  . Cataract surgery  septemeber 2013  . [facility]  [phone]    Outpatient Encounter Prescriptions as of 12/05/2014  Medication Sig  . ALPRAZolam (XANAX) 0.5 MG tablet Take 1 tablet (0.5 mg total) by mouth 3 (three) times daily as needed.  [name] amLODipine (NORVASC) 10 MG tablet TAKE 1 TABLET (10 MG TOTAL) BY MOUTH DAILY.  [name] aspirin 81 MG tablet Take 81 mg by mouth daily.    . Flaxseed, Linseed, (FLAXSEED OIL) 1000 MG CAPS Take 1 capsule by mouth daily.    . Fluticasone-Salmeterol (ADVAIR DISKUS) 250-50 MCG/DOSE AEPB Inhale 1 puff into the lungs every 12 (twelve) hours.   [name] ibuprofen (ADVIL,MOTRIN) 600 MG tablet Take 1 tablet (600 mg total) by mouth daily as needed.  [name] lisinopril (PRINIVIL,ZESTRIL) 40 MG tablet TAKE 1 TABLET (40 MG TOTAL) BY MOUTH DAILY.  . metoprolol tartrate (LOPRESSOR) 25 MG tablet TAKE TWO TABLETS BY MOUTH IN THE MORNING AND ONE TABLET IN THE EVENING  . Multiple Vitamin (MULTIVITAMIN) capsule Take 1 capsule by mouth daily.    . Multiple Vitamins-Minerals (OCUVITE ADULT 50+ PO) Take 1 tablet by mouth 2 (two) times daily.    . Omega-3 Fatty Acids (FISH OIL) 1200 MG CAPS Take 1 capsule by mouth daily.    . simvastatin (ZOCOR) 20 MG tablet TAKE 1 TABLET (20 MG TOTAL) BY MOUTH AT BEDTIME.  . [DISCONTINUED] ALPRAZolam (XANAX) 0.5 MG tablet TAKE ONE TABLET BY MOUTH THREE TIMES DAILY AS NEEDED  . [DISCONTINUED] amLODipine (NORVASC) 10 MG tablet TAKE 1 TABLET (10 MG TOTAL) BY MOUTH DAILY.  . [DISCONTINUED] Fluticasone-Salmeterol (ADVAIR DISKUS) 250-50 MCG/DOSE AEPB Inhale 1 puff into the lungs every 12 (twelve) hours.  . [DISCONTINUED] ibuprofen (ADVIL,MOTRIN) 600 MG tablet Take 1 tablet (600 mg total) by mouth daily as needed for pain.  . [DISCONTINUED] lisinopril (PRINIVIL,ZESTRIL) 40 MG tablet TAKE 1 TABLET (40 MG TOTAL) BY MOUTH DAILY.  . [DISCONTINUED] simvastatin (ZOCOR) 20 MG tablet TAKE 1 TABLET (20 MG TOTAL) BY MOUTH AT BEDTIME.  [name] amoxicillin-clavulanate (AUGMENTIN) 875-125 MG per tablet Take 1 tablet by mouth 2 (two) times daily.  [name] tiotropium (SPIRIVA) 18 MCG inhalation capsule Place 1 capsule (18 mcg total) into inhaler and inhale daily.  . [DISCONTINUED] nitroGLYCERIN (NITROSTAT) 0.4 MG SL tablet Place 1 tablet (0.4 mg total) under the tongue every 5 (five) minutes as needed. (Patient not taking: Reported on 12/05/2014)    Allergies  Allergen Reactions  . Dextromethorphan-Guaifenesin     REACTION: GI upset    Current Medications, Allergies, Past Medical History, Past Surgical History, Family History, and Social History were reviewed  in [employer] record.    Review of Systems        See HPI - all other systems neg except as noted... The patient complains of some dyspnea on exertion.  The patient denies anorexia, fever, weight loss, weight gain, vision loss, decreased hearing, hoarseness, chest pain, syncope, peripheral edema, prolonged cough, headaches, hemoptysis, abdominal pain, melena, hematochezia, severe indigestion/heartburn, hematuria, incontinence, muscle weakness, suspicious  skin lesions, transient blindness, difficulty walking, depression, unusual weight change, abnormal bleeding, enlarged lymph nodes, and angioedema.     Objective:   Physical Exam    WD, Obese, 69 y/o WM in NAD... Vital Signs:  Reviewed... GENERAL:  Alert & oriented; pleasant & cooperative... HEENT:  [place]/AT, EOM-wnl, PERRLA, EACs-clear, TMs-wnl, NOSE- sl congestion, THROAT-clear & wnl. NECK:  Supple w/ fairROM; no JVD; normal carotid impulses w/o bruits; no thyromegaly or nodules palpated; no lymphadenopathy. CHEST:  Clear to P & A; without wheezes/ rales/ or rhonchi heard... HEART:  Regular Rhythm; without murmurs/ rubs/ or gallops detected... ABDOMEN:  Obese, soft, & nontender; normal bowel sounds; no organomegaly or masses detected. EXT: without deformities, mild arthritic changes; no varicose veins/ +venous insuffic/ no edema. NEURO:  CN's intact; no focal neuro deficits... DERM:  No lesions noted; no rash etc...  RADIOLOGY DATA:  Reviewed in the EPIC EMR & discussed w/ the patient...  LABORATORY DATA:  Reviewed in the EPIC EMR & discussed w/ the patient...   Assessment & Plan:    COPD>  He has GOLD Stage3 COPD, still smoking cigaretttes and begged to quit; continue ADVAIR250Bid & add Spiriva daily; I reminded him that if he quit smoking he would have the $$ for his meds and some left over!  Acute sinusitis> presented 1/16 w/ 2wk hx bilat max & frontal sinusitis; treated w/ Augmentin875 Bid + nasal saline  etc...   Primary Care followed by [name]>> HBP>  .. CAD>   Followed by [name] LIPIDS>   DM>   GI> Hx Gastritis, Polpyps>   GU>   Followed by [name]   Patient's Medications  New Prescriptions   AMOXICILLIN-CLAVULANATE (AUGMENTIN) 875-125 MG PER TABLET    Take 1 tablet by mouth 2 (two) times daily.   TIOTROPIUM (SPIRIVA) 18 MCG INHALATION CAPSULE    Place 1 capsule (18 mcg total) into inhaler and inhale daily.  Previous Medications   ASPIRIN 81 MG TABLET    Take 81 mg by mouth daily.     FLAXSEED, LINSEED, (FLAXSEED OIL) 1000 MG CAPS    Take 1 capsule by mouth daily.     METOPROLOL TARTRATE (LOPRESSOR) 25 MG TABLET    TAKE TWO TABLETS BY MOUTH IN THE MORNING AND ONE TABLET IN THE EVENING   MULTIPLE VITAMIN (MULTIVITAMIN) CAPSULE    Take 1 capsule by mouth daily.     MULTIPLE VITAMINS-MINERALS (OCUVITE ADULT 50+ PO)    Take 1 tablet by mouth 2 (two) times daily.     OMEGA-3 FATTY ACIDS (FISH OIL) 1200 MG CAPS    Take 1 capsule by mouth daily.    Modified Medications   Modified Medication Previous Medication   ALPRAZOLAM (XANAX) 0.5 MG TABLET ALPRAZolam (XANAX) 0.5 MG tablet      Take 1 tablet (0.5 mg total) by mouth 3 (three) times daily as needed.    TAKE ONE TABLET BY MOUTH THREE TIMES DAILY AS NEEDED   AMLODIPINE (NORVASC) 10 MG TABLET amLODipine (NORVASC) 10 MG tablet      TAKE 1 TABLET (10 MG TOTAL) BY MOUTH DAILY.    TAKE 1 TABLET (10 MG TOTAL) BY MOUTH DAILY.   FLUTICASONE-SALMETEROL (ADVAIR DISKUS) 250-50 MCG/DOSE AEPB Fluticasone-Salmeterol (ADVAIR DISKUS) 250-50 MCG/DOSE AEPB      Inhale 1 puff into the lungs every 12 (twelve) hours.    Inhale 1 puff into the lungs every 12 (twelve) hours.   IBUPROFEN (ADVIL,MOTRIN) 600 MG TABLET ibuprofen (ADVIL,MOTRIN) 600 MG tablet  Take 1 tablet (600 mg total) by mouth daily as needed.    Take 1 tablet (600 mg total) by mouth daily as needed for pain.   LISINOPRIL (PRINIVIL,ZESTRIL) 40 MG TABLET lisinopril (PRINIVIL,ZESTRIL)  40 MG tablet      TAKE 1 TABLET (40 MG TOTAL) BY MOUTH DAILY.    TAKE 1 TABLET (40 MG TOTAL) BY MOUTH DAILY.   SIMVASTATIN (ZOCOR) 20 MG TABLET simvastatin (ZOCOR) 20 MG tablet      TAKE 1 TABLET (20 MG TOTAL) BY MOUTH AT BEDTIME.    TAKE 1 TABLET (20 MG TOTAL) BY MOUTH AT BEDTIME.  Discontinued Medications   NITROGLYCERIN (NITROSTAT) 0.4 MG SL TABLET    Place 1 tablet (0.4 mg total) under the tongue every 5 (five) minutes as needed.

## 2014-12-24 DIAGNOSIS — N359 Urethral stricture, unspecified: Secondary | ICD-10-CM | POA: Diagnosis not present

## 2014-12-24 DIAGNOSIS — R3915 Urgency of urination: Secondary | ICD-10-CM | POA: Diagnosis not present

## 2014-12-24 DIAGNOSIS — Z8551 Personal history of malignant neoplasm of bladder: Secondary | ICD-10-CM | POA: Diagnosis not present

## 2015-06-05 ENCOUNTER — Ambulatory Visit (INDEPENDENT_AMBULATORY_CARE_PROVIDER_SITE_OTHER)
Admission: RE | Admit: 2015-06-05 | Discharge: 2015-06-05 | Disposition: A | Discharge status: Home / Self Care | Payer: Medicare Other | Source: Ambulatory Visit | Attending: Pulmonary Disease | Admitting: Pulmonary Disease

## 2015-06-05 ENCOUNTER — Encounter: Payer: Self-pay | Admitting: Pulmonary Disease

## 2015-06-05 ENCOUNTER — Ambulatory Visit (INDEPENDENT_AMBULATORY_CARE_PROVIDER_SITE_OTHER): Payer: Medicare Other | Admitting: Pulmonary Disease

## 2015-06-05 VITALS — BP 114/64 | HR 78 | Temp 97.3°F | Wt 204.2 lb

## 2015-06-05 DIAGNOSIS — I10 Essential (primary) hypertension: Secondary | ICD-10-CM

## 2015-06-05 DIAGNOSIS — J449 Chronic obstructive pulmonary disease, unspecified: Secondary | ICD-10-CM | POA: Diagnosis not present

## 2015-06-05 DIAGNOSIS — Z720 Tobacco use: Secondary | ICD-10-CM | POA: Diagnosis not present

## 2015-06-05 DIAGNOSIS — I2510 Atherosclerotic heart disease of native coronary artery without angina pectoris: Secondary | ICD-10-CM | POA: Diagnosis not present

## 2015-06-05 DIAGNOSIS — F17200 Nicotine dependence, unspecified, uncomplicated: Secondary | ICD-10-CM

## 2015-06-05 MED ORDER — FLUTICASONE-SALMETEROL 250-50 MCG/DOSE IN AEPB: 1.0000 | INHALATION_SPRAY | Freq: Two times a day (BID) | RESPIRATORY_TRACT | Status: DC

## 2015-06-05 NOTE — Patient Instructions (Signed)
Today we updated your med list in our EPIC system...    Continue your current medications the same...  Be sure to use your [place] regularly every day...    And continue to work on smoking cessation...  Today we did a follow up CXR-     It shows your COPD/ emphysema, some scar tissue but no acute changes...    Given your long smoking history you should consider the "low-dose screening CT Chest" for the early detection of lung cancer...       We can set this up t any time if you agree...  Call for any questions...  Let's plan a follow up visit in 46mo sooner if needed for problems..Marland KitchenMarland Kitchen

## 2015-06-05 NOTE — Progress Notes (Addendum)
Subjective:    Patient ID: [name], male    DOB: 12-13-1945, 69 y.o.   MRN: [id number]  HPI [name] here for a follow up visit and on-going management of mult med problems...  ~  SEE PREV EPIC NOTES FOR OLDER DATA >>   ~  June 05, 2013:  [name] gained 11# having stopped smoking in the interval, states his last cig was 6/22- now we have to work on his weight;  We reviewed the following medical problems during today's office visit >>     COPD, ex-smoker> on Advair250; states he quit smoking 6/14; min cough, sm amt beige phlegm w/o blood, denies SOB but sedentary x yard work...    HBP> on Metop25-3/d, Amlod10, Lisin40; BP=130/78 & he denies CP, palpit, SOB, edema...    CAD> on ASA81; followed by [name] & seen 3/14> CAD, IWMI 1995, mult PCIs, last Myoview 2013 was low risk; too sedentary, no changes made; 752y/o brother died recently w/ AAA rupture...    Chol> on Simva20; FLP 7/14 showed TChol 158, TG 145, HDL 33, LDL 96; needs better diet & wt reduction or incr dose next time...     DM> on diet alone; LABS 7/14 showed BS= 120, A1c= 6.7; we reviewed low carb diet, exercise program, and wt reduction...    Obese> wt=230# which is up 11# despite his attempts at diet, exercise!    GI- Gastritis, polyps> prev on Zantac; last colon 2004 w/ hyperplastic polyp removed & f/u due 2014...    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    DJD> on OTC analgesics as needed...    Anxiety> on Alpraz0.563mprn... We reviewed prob list, meds, xrays and labs> see below for updates >>   CXR 7/14 showed COPD/ emphysema, incr markings and scarring at the bases, NAD...  LABS 7/14:  FLP- ok on Simva20;  Chems- ok x BS=120 A1c=6.7;  CBC- wnl;  TSH=1.26;  PSA=0.41...   ~  December 05, 2013:  [name] says he's decr his smoking from Prev 3-4ppd down to 3 cig/d;  His weight is unchanged at 229# w/  BMI=36...    He remains on Advair250> states breathing is ok, at baseline CAT score=12; baseline cough, sput, no hemoptysis, stable DOE, no edema...    BP remains stable on Metop25-2AM & 1PM, Amlod5, Lisin40; BP= 120/72 and he denies CP, palpit, ch in SOB, edema...    Chol is treated w/ Simva20> last FLP 7/14 was ok x low HDL=33, weight = 229# (no change) & we reviewed diet, exercise, wt reduction strategies...    Borderline DM treated w/ diet alone 7 he realizes that he must lose wt, limit carbs, or face DM meds in the future... We reviewed prob list, meds, xrays and labs> see below for updates >> he received the 2014 flu vaccine 10/14 and requests refills today...   ~  June 04, 2014:  [name] & [name] has established Primary Care follow [facility]/ [name]     Here for a 643mo 54moonary follow up visit> he has Stage3 COPD (see PFTs below) on Advair250Bid but still smoking & while he claimed to have decreased to 3cig/d last visit, he is now back up to 1/2-1ppd again (recall hx of former 3-4ppd habit);  Furthermore he anounces that he is in the donut  hole and needs samples of his Advair & declines to consider additional medications (ie- Spiriva) due to the cost issues;  He notes mild cough, sm amt beige-yellow sput production, no hemoptysis; he denies CP, palpit, or edema; he notes SOB/ DOE w/ exercise/ activity in the hot weather but he maintains 3 yards (riding mower) and has no issues w/ ADLs...     He had f/u visit w/ [name] for Cards 4/15> known CAD w/ IWMI 1995, mult PCIs over the yrs and last 2009 w/ overlapping stents in RCA, last Myoview was 2013 & low risk; asked to quit smoking & lose weight; meds include: ASA81, Metop25Bid, Amlod10, Lisin40, and no changes made...     He is maintained on Simva20 + FishOil for his Lipids and he is due for yearly FLP which he will get w/ [name] in August...    He had f/u visit w/ [name], Urology 2/15> Hx Bladder cancer 2011 w/ TURBT  followed by BCG and surveillance cysto 2/15 was neg; BPH w/ LTOS and CC is nocturia x 2-3; last PSA was 0.41 in [id number]...    He is under some increased stress w/ wife Dx w/ skin cancer (?melanoma) for which she has had several surgeries; he has [name] for prn use... We reviewed prob list, meds, xrays and labs> see below for updates >>   CXR 7/15 showed underlying COPD, mild bibasilar fibrosis, norm heart size, DJD in spine, NAD...   PFT 7/15 showed> FVC=3.18 (78%), FEV1=1.22 (38%), %1sec=38; mid-flows=13% predicted... This is c/w GOLD Stage3 COPD and we discussed this... REC>  He is rec to quit all smoking immediately; he has been using the nicotine gum & rec to try patches, offered Wellbutrin, he refuses Chantix; needs to continue ADVAIR250Bid & samples givem; he declines to start SSpauldingat this time due to cost & donut hole (we will address this again on return); reminded to use MUCINEX 6070m 1to2 Bid w/ fluids, and OK to use OTC DELSYM as needed for cough...    ~  December 05, 2014:  39m64moV & pulmonary recheck; [name] continued smoking, perhaps decr slightly to 1/2ppd but unable to quit & he has gained 6# in the interim up to 221# today;  His CC is sinusitis w/ nasal congestion, drainage & green mucus plus maxillary/ frontal HAs; he denies f/c/s, notes stable cough & chest symptoms w/ DOE;  He has been taking his Advair250Bid but prev refused addition of an anticholinergic due to "the donut hole";  States his breathing is "fine, except in cold air" ( we discussed wearing a scarf in winter etc)... He has GOLD Stage3 COPD w/ FEV1=1.22 (38%predicted) done 7/15;;  Exam shows decr BS bilat w/ few scat rhonchi & mild exp wheezing esp w/ forced maneuver...     We reviewed prob list, meds, xrays and labs> see below for updates >> [name] and sees him once per year... PLAN>> we discussed treating his acute max sinusitis w/ Augmentin875Bid, plus align daily &  nasal saline as needed; he knows the importance of smoking cessation but is not motivated & declines smoking cessation help; he will continue the Advair250Bid 7 we are adding spiriva via handihaler once per day; we plan ROV in 39mo8mof/u CXR & PFT, call in the interim for any breathing issues....   ~  June 05, 2015:  39mo 75mo& Nathaneal reports a good interval, no new complaints or concerns; still smoking +/-1ppd and  blames family stress; notes cough, sm amy yellow sput, no hemoptysis, SOB w/o change/stable- still mows 3 yards, does chores, tends to 20+chickens & their eggs, etc; he has repeatedly declined smoking cessation help, clearly not motivated to quit, not even doing his inhalers regularly!!!    COPD, +smoker> on Advair250Bid & Spiriva daily; symptoms as above, he is stoic, still smoking 1ppd & NOT motivated to quit, asked to cut back & use both inhalers regularly...    HBP> on Metop25Bid, Amlod10, Lisin40; BP=114/64 & he denies CP, palpit, SOB, edema...    CAD> on ASA81; followed by [name] & seen 4/15 (overdue for f/u)> CAD, IWMI 1995, mult PCIs, last Myoview 2013 was low risk; too sedentary, no changes made; 23 y/o brother died w/ AAA rupture...    Chol> on Simva20; FLP 7/14 showed TChol 158, TG 145, HDL 33, LDL 96; needs better diet & wt reduction and ret for Fasting blood work overdue.    DM> on diet alone; LABS 7/14 showed BS= 120, A1c= 6.7; we reviewed low carb diet, exercise program, and wt reduction...    Obese> wt recorded as 204# today but it doesn't look like he's lost 26#; we reviewed diet & exercise...    GI- Gastritis, polyps> prev on Zantac; prev colon 2004 w/ hyperplastic polyp removed & f/u 8/14 by DrStark w/ 3 polyps removed (5-19m size, all were tubular adenomas), +divertics in sigmoid, f/u planned 5 yrs... NOTE> exam showed a small palp knot in mid-abd above the umbilicus ?etiology (I offered CT Abd but he declines & wants to discuss w/ his primary- [name]).    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    DJD> on OTC analgesics as needed...    Anxiety> on Alpraz0.68m39mrn... We reviewed prob list, meds, xrays and labs> see below for updates >>  NOTE> HE IS FOLLOWED BY [name] for GEN MED/ PCP now...  CXR 06/05/15 showed stable heart size, Ao atherosclerosis & tortuousiy, COPD/emphysema, incr markings at the bases, arthritis in Tspine...  LABS 7/16: pt asked to ret for fasting blood work... IMP/PLAN>>  ErnRajeevys he is stable but continues to smoke 1ppd; CXR w/ COPD/emphysema, atherosclerotic Ao, chronic changes but NAD- we discussed the low-dose screening CT Chest for the early detection of lung cancer (he is a candidate & he will consider participating in this program); in the meanwhile- continue Advair/ Spiriva regularly;  He is overdue for Cards f/u w/ [name], and needs to continue Q6mo19mo w/ [name] (w/ copy of notes to us);[place] will ret for FASTING blood work.  ADDENDUM>> given his age and smoking history, ErneIsreal Molinea candidate for LUNG CANCER SCREENING w/ LOW-DOSE CT Chest>>  This office visit constitutes a face-to-face visit for the purpose of lung cancer screening counseling and a shared decision making visit...  Eligibility for LDCT screening:  1) Age=70 (must be 55-77y/o);  2) Absence of symptoms of lung cancer: he denies CP, ch in SOB, hemoptysis.  Smoking History:  1) He has 60+ pk-yrs (must have >30 pk-yr hx smoking);  2) Smoking status- still smoking ~1ppd (current or former smoker who quit<15 yrs ago).  Counseling:  Importance of smoking cessation &/or continued abstinence discussed ; offered smoking cessation interventions- pt declined; pt agreed to continued  LDCT screening annually.  Shared decision making:  We reviewed the potential benefits  and harms of screening> eg. Early detection of lung cancer and improved survival, the need for additional diagnostic tests & possible surgery, the risk of over-diagnosis/ false positives/ and radiation exposure... All questions were answered to the best of my ability & he would like to consider the LDCT screening & he will let us know his decision...          Problem List:  Hx of SINUSITIS (ICD-473.9) - off Nasonex now & recent Rx w/ Augmentin/ Saline... notes occas sinus congestion, drainage, sl HA, etc... he knows that he needs to quit smoking! ~  10/10: Adm for epistaxis w/ eval by DrWolicki- CXR= NAD, labs OK, rec for saline lavage- nose bleed resolved & disch home (no recurrence so far). ~  1/16: he presented w/ acute max & frontal sinusitis> Rx w/ augmentin, Align, Saline, Mucinex, etc...  COPD (ICD-496) - on ADVAIR 250Bid; STILL SMOKES CIGARETTES & he's refused Chantix...  ~  He had hemoptysis 6/99 without lesion on CXR, CTChest, or Bronchoscopy... ~  Adm 10/10 w/ epistaxsis & eval DrWolicki as noted> he wanted to do Sleep Study but pt cancelled it & declined to resched. 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 ~  CXR 5/12 showed ectatic calcif & tort Aorta, COPD w/ incr markings ar bases, NAD, +degen spondylosis. ~  CXR 6/13 showed COPD/ emphysema, scarring in the lower lobes, otherw clear lungs, DJD in spine... ~  CXR 7/14 showed COPD/ emphysema, incr markings and scarring at the bases, NAD. ~  1/15: [name] says he's decr his smoking from prev 3-4ppd down to 3 cig/d;  He remains on Advair250> states breathing is ok, at baseline CAT score=12; baseline cough, sput, no hemoptysis, stable DOE, no edema... ~  CXR 7/15 showed underlying COPD, mild bibasilar fibrosis, norm heart size, DJD in spine, NAD...  ~  PFT 7/15 showed> FVC=3.18 (78%), FEV1=1.22 (38%), %1sec=38; mid-flows=13% predicted... This is c/w GOLD Stage3 COPD and we discussed this> He is rec to quit all smoking  immediately; he has been using the nicotine gum & rec to try patches, offered Wellbutrin, he refuses Chantix; needs to continue ADVAIR250Bid & samples givem; he declines to start Ubly at this time due to cost & donut hole (we will address this again on return); reminded to use MUCINEX 664m- 1to2 Bid w/ fluids, and OK to use OTC DELSYM as needed for cough...   ~  1/16: he claims stable on Advair250 but still smoking & can't vs won't quit; we decided to add Spiriva daily via handihaler... 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    HE HAS ESTABLISHED PRIMARY CARE w/ [name]>>   HYPERTENSION (ICD-401.9) - controlled on METOPROLOL 259mid,  NORVASC 1033m, LISINOPRIL 27m44m..   ~  11/11:  BP=122/70, not checking BP's at home... denies HA, fatigue, visual changes, CP, palipit, dizziness, syncope, dyspnea, edema, etc. ~  5/12:  BP= 120/72, pt very pleased w/ his wt loss & improved exercise ability. ~  6/13:  BP= 122/64 & feeling well w/o CP, palpit, SOB, edema, etc... ~  12/13:  BP=122/82 & he denies CP, palpit,  SOB, edema... ~  7/14: on Metop25-3/d, Amlod10, Lisin40; BP=130/78 & he denies CP, palpit, SOB, edema ~  1/15: BP remains stable on Metop25-2AM & 1PM, Amlod5, Lisin40; BP= 120/72 and he denies CP, palpit, ch in SOB, edema.  CORONARY ARTERY DISEASE (ICD-414.00) - ASA 66m/d & off Plavix as of 2/13 officially as he wasn't taking it regularly anyway. ~  Hx prev IWMI and PTCA/ stent in RCA by DrBrodie 12/95 & 5/96...  ~  Myoview 4/08 abnormal w/ inferior ischemia and subseq cath revealing restenoses in RCA- s/p repeat PTCA/ stents...  ~  f/u Myoview 9/08 w/ prev infer infarct, no ischemia, EF=56%... ~   9/11:  pre op cardiac eval DrBrodie> OK to hold ASA/ Plavix for bladder surg. 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 ~  2/13:  He had f/u [name] w/ Myoview showing a fixed defect in infer wall, no ischemia, EF=58%; pt wasn't taking Plavix, therefore stopped in favor of ASA872md... ~  3/14:  He had f/u [name]> CAD, IWMI 1995, mult PCIs, last Myoview 2013 was low risk; too sedentary, no changes made; 7338/o brother died recently w/ AAA rupture... ~  EKG 3/14 showed NSR, rate65, WNL, NAD...  HYPERCHOLESTEROLEMIA (Mixed Hyperlipidemia) - prev on Simva80 but DrBrodie decr him to SIMVASTATIN 2043m + Fish Oil & Red Wine qd... ~  FLPPort Dickinson08 on Simva40 showed TChol 151, TG 106, HDL 23, LDL 107 ~  FLP 8/09 on Simva80 showed TChol 142, TG 135, HDL 28, LDL 87 ~  FLP 6/10 on Simva80 showed TChol 151, TG 117, HDL 31, LDL 97 ~  FLP in hosp 10/10 showed TChol 128, TG 110, HDL 26, LDL 80 ~  FLP 5/11 on Simva80 showed TChol 144, TG 215, HDL 26, LDL 89 ~  9/11:  DrBrodie decr him to Simva20... we discussed the need for better diet & wt reduction. ~  FLP 5/12 on Simva20 showed TChol 162, TG 159, HDL 32, LDL 98 ~  FLP 6/13 on Simva20 showed TChol 130, TG 102, HDL 33, LDL 77... rec to incr exercise program & get wt down! ~  FLP 7/14 on Simva20 showed TChol 158, TG 145, HDL 33, LDL 96... He has gained way too much wt 7 must get on diet!  DIABETES MELLITUS, BORDERLINE (ICD-790.29) - on diet alone... ~  labs 9/08 showed BS=100, HgA1c=6.1 ~  labs 8/09 showed BS= 107, A1c= 6.2 ~  labs 6/10 showed BS= 108, A1c= 6.2 ~  lab 5/11 (wt=244#) showed BS= 121. A1c= 6.7... Marland Kitchene was told "get wt down, or start meds". ~  Labs 5/12 showed BS= 109, A1c= 6.1 ~  Labs 6/13 showed BS= 114, & he needs to lose the weight... ~  Labs 7/14 on diet alone showed BS= 120, A1c= 6.7  OBESITY (ICD-278.00) - weight was down as low as 202# in 2/09, and steadily  incr to 244# by 5/11... ~  weight 5/11 = 244#... Marland Kitchenwe reviewed diet + exercise necessary to lose weight... ~  weight 11/11 = 217# (after dx & rx for bladder cancer) ~  Weight 5/12 = 214# but says it's 192# at home... ~  Weight 6/13 = 211# but knows it's better since his pant/ belt is down to 36" (under his roll). ~  Weight 7/14 = 230# but he has quit smoking he says, now must lose the weight... ~  Weight 1/15 =  229#  GASTRITIS (ICD-535.50) - had EGD 10/05 showing gastritis... on RANITADINE 338m/d due to his Plavix Rx, off prev Omep.  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 ~  4/12:  Pt reported f/u cysto was neg, doing well... ~  10/12:  Note from [name] reviewed> cysto was neg, no recurrent tumors... ~  4/13:  Note from [name] reviewed> neg f/u cysto w/o recurrent TCCa... He wants to continue Q6mo52motoscopies... ~  1/14:  He had f/u w/ [name]> Hx TCCa bladder, s/p TURBT 9/11, given BCG 11/11-12/11 and f/u Cysto 1/12 was neg; repeat cysto 1/14 was neg as well...  DEGENERATIVE JOINT DISEASE (ICD-715.90) - He has c/o right shoulder pain, hip pain, knee pain, and LBP> Rx ADVIL w/ some benefit... offered Ortho referral & he will decide.  ANXIETY (ICD-300.00) - on ALPRAZOLAM 0.5mg 82m...   Past Surgical History  Procedure Laterality Date  . Cataract surgery/sub posterior vitrectomy in left eye  1996    Dr. RankiZadie Rhineataract surgery  septemeber 2013  . Ptca  1995,[phone] Encounter Prescriptions as of 06/05/2015  Medication Sig  . ALPRAZolam (XANAX) 0.5 MG tablet Take 1 tablet (0.5 mg total) by  mouth 3 (three) times daily as needed.  . amLMarland KitchenDipine (NORVASC) 10 MG tablet TAKE 1 TABLET (10 MG TOTAL) BY MOUTH DAILY.  . amoMarland Kitchenicillin-clavulanate (AUGMENTIN) 875-125 MG per tablet Take 1 tablet by mouth 2 (two) times daily.  . aspMarland Kitchenrin 81 MG tablet Take 81 mg by mouth daily.    . Flaxseed, Linseed, (FLAXSEED OIL) 1000 MG CAPS Take 1 capsule by mouth daily.    . Fluticasone-Salmeterol (ADVAIR DISKUS) 250-50 MCG/DOSE AEPB Inhale 1 puff into the lungs every 12 (twelve) hours.  . ibuMarland Kitchenrofen (ADVIL,MOTRIN) 600 MG tablet Take 1 tablet (600 mg total) by mouth daily as needed.  . lisMarland Kitchennopril (PRINIVIL,ZESTRIL) 40 MG tablet TAKE 1 TABLET (40 MG TOTAL) BY MOUTH DAILY.  . metoprolol tartrate (LOPRESSOR) 25 MG tablet TAKE TWO TABLETS BY MOUTH IN THE MORNING AND ONE TABLET IN THE EVENING  . Multiple Vitamin (MULTIVITAMIN) capsule Take 1 capsule by mouth daily.    . Multiple Vitamins-Minerals (OCUVITE ADULT 50+ PO) Take 1 tablet by mouth 2 (two) times daily.    . Omega-3 Fatty Acids (FISH OIL) 1200 MG CAPS Take 1 capsule by mouth daily.    . simvastatin (ZOCOR) 20 MG tablet TAKE 1 TABLET (20 MG TOTAL) BY MOUTH AT BEDTIME.  . tioMarland Kitchenropium (SPIRIVA) 18 MCG inhalation capsule Place 1 capsule (18 mcg total) into inhaler and inhale daily.   No facility-administered encounter medications on file as of 06/05/2015.    Allergies  Allergen Reactions  . Dextromethorphan-Guaifenesin     REACTION: GI upset    Current Medications, Allergies, Past Medical History, Past Surgical History, Family History, and Social History were reviewed in [employer].    Review of Systems        See HPI - all other systems neg except as noted... The patient complains of some dyspnea on exertion.  The patient denies anorexia, fever, weight loss, weight gain, vision  loss, decreased hearing, hoarseness, chest pain, syncope, peripheral edema, prolonged cough, headaches, hemoptysis, abdominal pain, melena,  hematochezia, severe indigestion/heartburn, hematuria, incontinence, muscle weakness, suspicious skin lesions, transient blindness, difficulty walking, depression, unusual weight change, abnormal bleeding, enlarged lymph nodes, and angioedema.     Objective:   Physical Exam    WD, Obese, 69 y/o [name] in NAD... Vital Signs:  Reviewed... GENERAL:  Alert & oriented; pleasant & cooperative... HEENT:  [place]/AT, EOM-wnl, PERRLA, EACs-clear, TMs-wnl, NOSE- sl congestion, THROAT-clear & wnl. NECK:  Supple w/ fairROM; no JVD; normal carotid impulses w/o bruits; no thyromegaly or nodules palpated; no lymphadenopathy. CHEST:  Clear to P & A; without wheezes/ rales/ or rhonchi heard... HEART:  Regular Rhythm; without murmurs/ rubs/ or gallops detected... ABDOMEN:  Obese, soft, & nontender; ?firm knot palpated in mid abd above umbilicus ?etiology; normal bowel sounds; no organomegaly detected. EXT: without deformities, mild arthritic changes; no varicose veins/ +venous insuffic/ no edema. NEURO:  CN's intact; no focal neuro deficits... DERM:  No lesions noted; no rash etc...  RADIOLOGY DATA:  Reviewed in the EPIC EMR & discussed w/ the patient...  LABORATORY DATA:  Reviewed in the EPIC EMR & discussed w/ the patient...   Assessment & Plan:    COPD>  He has GOLD Stage3 COPD, still smoking cigaretttes and begged to quit; continue ADVAIR250Bid & Spiriva daily; I reminded him that if he quit smoking he would have the $$ for his meds and some left over!  We discussed his participation in the low dose screening CT program & he will decide...  Acute sinusitis> presented 1/16 w/ 2wk hx bilat max & frontal sinusitis; treated w/ Augmentin875 Bid + nasal saline etc...   Primary Care followed by [name]>> HBP>  .. CAD>   Followed by [name]> he is overdue for f/u OV... LIPIDS>   DM>   GI> Hx Gastritis, Polyps>  Knot palpated in mid abd ?etiology, non-tender, offered CT Abd for further eval- he declines and  wants to discuss w/ his primary, DrHowlerda. GU>   Followed by [name]   Patient's Medications  New Prescriptions   FLUTICASONE-SALMETEROL (ADVAIR DISKUS) 250-50 MCG/DOSE AEPB    Inhale 1 puff into the lungs 2 (two) times daily.  Previous Medications   ALPRAZOLAM (XANAX) 0.5 MG TABLET    Take 1 tablet (0.5 mg total) by mouth 3 (three) times daily as needed.   AMLODIPINE (NORVASC) 10 MG TABLET    TAKE 1 TABLET (10 MG TOTAL) BY MOUTH DAILY.   ASPIRIN 81 MG TABLET    Take 81 mg by mouth daily.     FLAXSEED, LINSEED, (FLAXSEED OIL) 1000 MG CAPS    Take 1 capsule by mouth daily.     FLUTICASONE-SALMETEROL (ADVAIR DISKUS) 250-50 MCG/DOSE AEPB    Inhale 1 puff into the lungs every 12 (twelve) hours.   IBUPROFEN (ADVIL,MOTRIN) 600 MG TABLET    Take 1 tablet (600 mg total) by mouth daily as needed.   LISINOPRIL (PRINIVIL,ZESTRIL) 40 MG TABLET    TAKE 1 TABLET (40 MG TOTAL) BY MOUTH DAILY.   METOPROLOL TARTRATE (LOPRESSOR) 25 MG TABLET    TAKE TWO TABLETS BY MOUTH IN THE MORNING AND ONE TABLET IN THE EVENING   MULTIPLE VITAMIN (MULTIVITAMIN) CAPSULE    Take 1 capsule by mouth daily.     MULTIPLE VITAMINS-MINERALS (OCUVITE ADULT 50+ PO)    Take 1 tablet by mouth 2 (two) times daily.     OMEGA-3 FATTY ACIDS (FISH OIL) 1200 MG CAPS  Take 1 capsule by mouth daily.     SIMVASTATIN (ZOCOR) 20 MG TABLET    TAKE 1 TABLET (20 MG TOTAL) BY MOUTH AT BEDTIME.   TIOTROPIUM (SPIRIVA) 18 MCG INHALATION CAPSULE    Place 1 capsule (18 mcg total) into inhaler and inhale daily.  Modified Medications   No medications on file  Discontinued Medications   AMOXICILLIN-CLAVULANATE (AUGMENTIN) 875-125 MG PER TABLET    Take 1 tablet by mouth 2 (two) times daily.

## 2015-06-08 ENCOUNTER — Encounter: Payer: Self-pay | Admitting: Pulmonary Disease

## 2015-06-26 ENCOUNTER — Other Ambulatory Visit: Payer: Self-pay | Admitting: Pulmonary Disease

## 2015-07-02 ENCOUNTER — Other Ambulatory Visit: Payer: Self-pay | Admitting: Pulmonary Disease

## 2015-07-09 DIAGNOSIS — I10 Essential (primary) hypertension: Secondary | ICD-10-CM | POA: Diagnosis not present

## 2015-07-09 DIAGNOSIS — E785 Hyperlipidemia, unspecified: Secondary | ICD-10-CM | POA: Diagnosis not present

## 2015-07-09 DIAGNOSIS — Z125 Encounter for screening for malignant neoplasm of prostate: Secondary | ICD-10-CM | POA: Diagnosis not present

## 2015-07-16 DIAGNOSIS — I25118 Atherosclerotic heart disease of native coronary artery with other forms of angina pectoris: Secondary | ICD-10-CM | POA: Diagnosis not present

## 2015-07-16 DIAGNOSIS — R809 Proteinuria, unspecified: Secondary | ICD-10-CM | POA: Diagnosis not present

## 2015-07-16 DIAGNOSIS — E785 Hyperlipidemia, unspecified: Secondary | ICD-10-CM | POA: Diagnosis not present

## 2015-07-16 DIAGNOSIS — R739 Hyperglycemia, unspecified: Secondary | ICD-10-CM | POA: Diagnosis not present

## 2015-07-16 DIAGNOSIS — Z23 Encounter for immunization: Secondary | ICD-10-CM | POA: Diagnosis not present

## 2015-07-16 DIAGNOSIS — F17200 Nicotine dependence, unspecified, uncomplicated: Secondary | ICD-10-CM | POA: Diagnosis not present

## 2015-07-16 DIAGNOSIS — R7309 Other abnormal glucose: Secondary | ICD-10-CM | POA: Diagnosis not present

## 2015-07-16 DIAGNOSIS — Z0000 Encounter for general adult medical examination without abnormal findings: Secondary | ICD-10-CM | POA: Diagnosis not present

## 2015-07-16 DIAGNOSIS — I10 Essential (primary) hypertension: Secondary | ICD-10-CM | POA: Diagnosis not present

## 2015-07-16 DIAGNOSIS — Z6833 Body mass index (BMI) 33.0-33.9, adult: Secondary | ICD-10-CM | POA: Diagnosis not present

## 2015-07-16 DIAGNOSIS — Z1389 Encounter for screening for other disorder: Secondary | ICD-10-CM | POA: Diagnosis not present

## 2015-07-16 DIAGNOSIS — J449 Chronic obstructive pulmonary disease, unspecified: Secondary | ICD-10-CM | POA: Diagnosis not present

## 2015-07-18 DIAGNOSIS — Z1212 Encounter for screening for malignant neoplasm of rectum: Secondary | ICD-10-CM | POA: Diagnosis not present

## 2015-10-20 ENCOUNTER — Encounter (HOSPITAL_COMMUNITY): Payer: Self-pay | Admitting: Family Medicine

## 2015-10-20 ENCOUNTER — Emergency Department (HOSPITAL_COMMUNITY): Payer: Medicare Other

## 2015-10-20 ENCOUNTER — Inpatient Hospital Stay (HOSPITAL_COMMUNITY)
Admission: EM | Admit: 2015-10-20 | Discharge: 2015-10-29 | DRG: 190 | Disposition: A | Discharge status: Home / Self Care | Payer: Medicare Other | Attending: Internal Medicine | Admitting: Internal Medicine

## 2015-10-20 DIAGNOSIS — I272 Other secondary pulmonary hypertension: Secondary | ICD-10-CM | POA: Diagnosis present

## 2015-10-20 DIAGNOSIS — Z6835 Body mass index (BMI) 35.0-35.9, adult: Secondary | ICD-10-CM

## 2015-10-20 DIAGNOSIS — F17210 Nicotine dependence, cigarettes, uncomplicated: Secondary | ICD-10-CM | POA: Diagnosis present

## 2015-10-20 DIAGNOSIS — R0602 Shortness of breath: Secondary | ICD-10-CM | POA: Diagnosis not present

## 2015-10-20 DIAGNOSIS — J9601 Acute respiratory failure with hypoxia: Secondary | ICD-10-CM | POA: Diagnosis present

## 2015-10-20 DIAGNOSIS — J440 Chronic obstructive pulmonary disease with acute lower respiratory infection: Principal | ICD-10-CM | POA: Diagnosis present

## 2015-10-20 DIAGNOSIS — Z9842 Cataract extraction status, left eye: Secondary | ICD-10-CM | POA: Diagnosis not present

## 2015-10-20 DIAGNOSIS — R0600 Dyspnea, unspecified: Secondary | ICD-10-CM | POA: Diagnosis not present

## 2015-10-20 DIAGNOSIS — I420 Dilated cardiomyopathy: Secondary | ICD-10-CM | POA: Diagnosis present

## 2015-10-20 DIAGNOSIS — Z955 Presence of coronary angioplasty implant and graft: Secondary | ICD-10-CM

## 2015-10-20 DIAGNOSIS — D7589 Other specified diseases of blood and blood-forming organs: Secondary | ICD-10-CM | POA: Diagnosis present

## 2015-10-20 DIAGNOSIS — E784 Other hyperlipidemia: Secondary | ICD-10-CM | POA: Diagnosis not present

## 2015-10-20 DIAGNOSIS — Z7951 Long term (current) use of inhaled steroids: Secondary | ICD-10-CM

## 2015-10-20 DIAGNOSIS — R0902 Hypoxemia: Secondary | ICD-10-CM | POA: Diagnosis not present

## 2015-10-20 DIAGNOSIS — M1990 Unspecified osteoarthritis, unspecified site: Secondary | ICD-10-CM | POA: Diagnosis present

## 2015-10-20 DIAGNOSIS — Z8551 Personal history of malignant neoplasm of bladder: Secondary | ICD-10-CM

## 2015-10-20 DIAGNOSIS — E669 Obesity, unspecified: Secondary | ICD-10-CM | POA: Diagnosis present

## 2015-10-20 DIAGNOSIS — Z888 Allergy status to other drugs, medicaments and biological substances status: Secondary | ICD-10-CM

## 2015-10-20 DIAGNOSIS — E785 Hyperlipidemia, unspecified: Secondary | ICD-10-CM | POA: Diagnosis present

## 2015-10-20 DIAGNOSIS — Z79899 Other long term (current) drug therapy: Secondary | ICD-10-CM | POA: Diagnosis not present

## 2015-10-20 DIAGNOSIS — J441 Chronic obstructive pulmonary disease with (acute) exacerbation: Secondary | ICD-10-CM | POA: Diagnosis not present

## 2015-10-20 DIAGNOSIS — J189 Pneumonia, unspecified organism: Secondary | ICD-10-CM | POA: Diagnosis present

## 2015-10-20 DIAGNOSIS — R7303 Prediabetes: Secondary | ICD-10-CM | POA: Diagnosis present

## 2015-10-20 DIAGNOSIS — F419 Anxiety disorder, unspecified: Secondary | ICD-10-CM | POA: Diagnosis present

## 2015-10-20 DIAGNOSIS — I25118 Atherosclerotic heart disease of native coronary artery with other forms of angina pectoris: Secondary | ICD-10-CM | POA: Diagnosis not present

## 2015-10-20 DIAGNOSIS — Z9841 Cataract extraction status, right eye: Secondary | ICD-10-CM

## 2015-10-20 DIAGNOSIS — Z7982 Long term (current) use of aspirin: Secondary | ICD-10-CM

## 2015-10-20 DIAGNOSIS — I10 Essential (primary) hypertension: Secondary | ICD-10-CM | POA: Diagnosis not present

## 2015-10-20 DIAGNOSIS — F1910 Other psychoactive substance abuse, uncomplicated: Secondary | ICD-10-CM

## 2015-10-20 DIAGNOSIS — I2510 Atherosclerotic heart disease of native coronary artery without angina pectoris: Secondary | ICD-10-CM | POA: Diagnosis present

## 2015-10-20 DIAGNOSIS — Z720 Tobacco use: Secondary | ICD-10-CM

## 2015-10-20 DIAGNOSIS — R509 Fever, unspecified: Secondary | ICD-10-CM | POA: Diagnosis not present

## 2015-10-20 DIAGNOSIS — J449 Chronic obstructive pulmonary disease, unspecified: Secondary | ICD-10-CM | POA: Diagnosis not present

## 2015-10-20 DIAGNOSIS — J9622 Acute and chronic respiratory failure with hypercapnia: Secondary | ICD-10-CM | POA: Diagnosis present

## 2015-10-20 DIAGNOSIS — E7800 Pure hypercholesterolemia, unspecified: Secondary | ICD-10-CM | POA: Diagnosis present

## 2015-10-20 DIAGNOSIS — J9621 Acute and chronic respiratory failure with hypoxia: Secondary | ICD-10-CM | POA: Diagnosis present

## 2015-10-20 DIAGNOSIS — F17200 Nicotine dependence, unspecified, uncomplicated: Secondary | ICD-10-CM | POA: Diagnosis not present

## 2015-10-20 DIAGNOSIS — Z6837 Body mass index (BMI) 37.0-37.9, adult: Secondary | ICD-10-CM | POA: Diagnosis not present

## 2015-10-20 LAB — BRAIN NATRIURETIC PEPTIDE: B NATRIURETIC PEPTIDE 5: 498.4 pg/mL — AB (ref 0.0–100.0)

## 2015-10-20 LAB — CBC
HCT: 49.1 % (ref 39.0–52.0)
HEMOGLOBIN: 15.3 g/dL (ref 13.0–17.0)
MCH: 32.3 pg (ref 26.0–34.0)
MCHC: 31.2 g/dL (ref 30.0–36.0)
MCV: 103.8 fL — ABNORMAL HIGH (ref 78.0–100.0)
PLATELETS: 149 10*3/uL — AB (ref 150–400)
RBC: 4.73 MIL/uL (ref 4.22–5.81)
RDW: 19.9 % — ABNORMAL HIGH (ref 11.5–15.5)
WBC: 7.9 10*3/uL (ref 4.0–10.5)

## 2015-10-20 LAB — BASIC METABOLIC PANEL
ANION GAP: 7 (ref 5–15)
BUN: 12 mg/dL (ref 6–20)
CALCIUM: 9.1 mg/dL (ref 8.9–10.3)
CO2: 29 mmol/L (ref 22–32)
CREATININE: 0.81 mg/dL (ref 0.61–1.24)
Chloride: 102 mmol/L (ref 101–111)
Glucose, Bld: 122 mg/dL — ABNORMAL HIGH (ref 65–99)
Potassium: 5 mmol/L (ref 3.5–5.1)
SODIUM: 138 mmol/L (ref 135–145)

## 2015-10-20 LAB — VITAMIN B12: Vitamin B-12: 1614 pg/mL — ABNORMAL HIGH (ref 180–914)

## 2015-10-20 LAB — I-STAT TROPONIN, ED: TROPONIN I, POC: 0.01 ng/mL (ref 0.00–0.08)

## 2015-10-20 LAB — MRSA PCR SCREENING: MRSA by PCR: NEGATIVE

## 2015-10-20 LAB — FOLATE: Folate: 32.2 ng/mL (ref >5.9)

## 2015-10-20 LAB — D-DIMER, QUANTITATIVE (NOT AT ARMC): D DIMER QUANT: 1.09 ug{FEU}/mL — AB (ref 0.00–0.50)

## 2015-10-20 LAB — STREP PNEUMONIAE URINARY ANTIGEN: Strep Pneumo Urinary Antigen: NEGATIVE

## 2015-10-20 MED ORDER — FOLIC ACID 1 MG PO TABS
1.0000 mg | ORAL_TABLET | Freq: Every day | ORAL | Status: DC
  Administered 2015-10-20 – 2015-10-29 (×10): 1 mg via ORAL
  Filled 2015-10-20 (×10): qty 1

## 2015-10-20 MED ORDER — LORAZEPAM 2 MG/ML IJ SOLN: 1.0000 mg | Freq: Four times a day (QID) | INTRAMUSCULAR | Status: AC | PRN

## 2015-10-20 MED ORDER — ADULT MULTIVITAMIN W/MINERALS CH: 1.0000 | ORAL_TABLET | Freq: Every day | ORAL | Status: DC

## 2015-10-20 MED ORDER — SIMVASTATIN 20 MG PO TABS
20.0000 mg | ORAL_TABLET | Freq: Every day | ORAL | Status: DC
  Administered 2015-10-21 – 2015-10-28 (×8): 20 mg via ORAL
  Filled 2015-10-20 (×8): qty 1

## 2015-10-20 MED ORDER — IPRATROPIUM-ALBUTEROL 0.5-2.5 (3) MG/3ML IN SOLN
3.0000 mL | Freq: Four times a day (QID) | RESPIRATORY_TRACT | Status: DC
  Administered 2015-10-21 – 2015-10-28 (×29): 3 mL via RESPIRATORY_TRACT
  Filled 2015-10-20 (×28): qty 3

## 2015-10-20 MED ORDER — OMEGA-3-ACID ETHYL ESTERS 1 G PO CAPS
1.0000 g | ORAL_CAPSULE | Freq: Every day | ORAL | Status: DC
  Administered 2015-10-20 – 2015-10-29 (×10): 1 g via ORAL
  Filled 2015-10-20 (×11): qty 1

## 2015-10-20 MED ORDER — LISINOPRIL 20 MG PO TABS
40.0000 mg | ORAL_TABLET | Freq: Every day | ORAL | Status: DC
  Administered 2015-10-20 – 2015-10-24 (×5): 40 mg via ORAL
  Filled 2015-10-20 (×5): qty 2

## 2015-10-20 MED ORDER — MOMETASONE FURO-FORMOTEROL FUM 100-5 MCG/ACT IN AERO
2.0000 | INHALATION_SPRAY | Freq: Two times a day (BID) | RESPIRATORY_TRACT | Status: DC
  Administered 2015-10-20 – 2015-10-29 (×18): 2 via RESPIRATORY_TRACT
  Filled 2015-10-20: qty 8.8

## 2015-10-20 MED ORDER — DEXTROSE 5 % IV SOLN
500.0000 mg | INTRAVENOUS | Status: DC
  Administered 2015-10-21 – 2015-10-23 (×3): 500 mg via INTRAVENOUS
  Filled 2015-10-20 (×4): qty 500

## 2015-10-20 MED ORDER — IPRATROPIUM-ALBUTEROL 0.5-2.5 (3) MG/3ML IN SOLN
3.0000 mL | RESPIRATORY_TRACT | Status: DC
  Administered 2015-10-20: 3 mL via RESPIRATORY_TRACT
  Filled 2015-10-20: qty 3

## 2015-10-20 MED ORDER — THIAMINE HCL 100 MG/ML IJ SOLN: 100.0000 mg | Freq: Every day | INTRAMUSCULAR | Status: DC

## 2015-10-20 MED ORDER — ALBUTEROL (5 MG/ML) CONTINUOUS INHALATION SOLN
10.0000 mg/h | INHALATION_SOLUTION | RESPIRATORY_TRACT | Status: AC
  Administered 2015-10-20 (×2): 10 mg/h via RESPIRATORY_TRACT
  Filled 2015-10-20: qty 20

## 2015-10-20 MED ORDER — MAGNESIUM SULFATE 2 GM/50ML IV SOLN
2.0000 g | Freq: Once | INTRAVENOUS | Status: AC
  Administered 2015-10-20: 2 g via INTRAVENOUS
  Filled 2015-10-20: qty 50

## 2015-10-20 MED ORDER — PANTOPRAZOLE SODIUM 40 MG PO TBEC
40.0000 mg | DELAYED_RELEASE_TABLET | Freq: Every day | ORAL | Status: DC
  Administered 2015-10-21 – 2015-10-29 (×9): 40 mg via ORAL
  Filled 2015-10-20 (×9): qty 1

## 2015-10-20 MED ORDER — DEXTROSE 5 % IV SOLN
1.0000 g | INTRAVENOUS | Status: DC
  Administered 2015-10-21 – 2015-10-24 (×4): 1 g via INTRAVENOUS
  Filled 2015-10-20 (×5): qty 10

## 2015-10-20 MED ORDER — AZITHROMYCIN 250 MG PO TABS
500.0000 mg | ORAL_TABLET | Freq: Once | ORAL | Status: AC
  Administered 2015-10-20: 500 mg via ORAL
  Filled 2015-10-20: qty 2

## 2015-10-20 MED ORDER — DEXAMETHASONE SODIUM PHOSPHATE 10 MG/ML IJ SOLN
10.0000 mg | Freq: Once | INTRAMUSCULAR | Status: AC
  Administered 2015-10-20: 10 mg via INTRAVENOUS
  Filled 2015-10-20: qty 1

## 2015-10-20 MED ORDER — METHYLPREDNISOLONE SODIUM SUCC 125 MG IJ SOLR
60.0000 mg | Freq: Four times a day (QID) | INTRAMUSCULAR | Status: DC
  Administered 2015-10-20 – 2015-10-21 (×3): 60 mg via INTRAVENOUS
  Filled 2015-10-20 (×3): qty 2

## 2015-10-20 MED ORDER — DEXTROSE 5 % IV SOLN
1.0000 g | Freq: Once | INTRAVENOUS | Status: AC
  Administered 2015-10-20: 1 g via INTRAVENOUS
  Filled 2015-10-20: qty 10

## 2015-10-20 MED ORDER — ASPIRIN EC 81 MG PO TBEC
81.0000 mg | DELAYED_RELEASE_TABLET | Freq: Every day | ORAL | Status: DC
  Administered 2015-10-21 – 2015-10-29 (×9): 81 mg via ORAL
  Filled 2015-10-20 (×9): qty 1

## 2015-10-20 MED ORDER — MULTIVITAMINS PO CAPS: 1.0000 | ORAL_CAPSULE | Freq: Every day | ORAL | Status: DC

## 2015-10-20 MED ORDER — LORAZEPAM 1 MG PO TABS: 1.0000 mg | ORAL_TABLET | Freq: Four times a day (QID) | ORAL | Status: AC | PRN

## 2015-10-20 MED ORDER — ADULT MULTIVITAMIN W/MINERALS CH
1.0000 | ORAL_TABLET | Freq: Every day | ORAL | Status: DC
  Administered 2015-10-21 – 2015-10-29 (×9): 1 via ORAL
  Filled 2015-10-20 (×9): qty 1

## 2015-10-20 MED ORDER — NICOTINE 21 MG/24HR TD PT24
21.0000 mg | MEDICATED_PATCH | Freq: Every day | TRANSDERMAL | Status: DC
  Administered 2015-10-20 – 2015-10-29 (×10): 21 mg via TRANSDERMAL
  Filled 2015-10-20 (×10): qty 1

## 2015-10-20 MED ORDER — ALPRAZOLAM 0.5 MG PO TABS
0.5000 mg | ORAL_TABLET | Freq: Three times a day (TID) | ORAL | Status: DC | PRN
  Administered 2015-10-21 – 2015-10-28 (×8): 0.5 mg via ORAL
  Filled 2015-10-20 (×8): qty 1

## 2015-10-20 MED ORDER — METOPROLOL TARTRATE 25 MG PO TABS
25.0000 mg | ORAL_TABLET | Freq: Two times a day (BID) | ORAL | Status: DC
  Administered 2015-10-20 – 2015-10-21 (×2): 25 mg via ORAL
  Filled 2015-10-20 (×2): qty 1

## 2015-10-20 MED ORDER — ENOXAPARIN SODIUM 40 MG/0.4ML ~~LOC~~ SOLN
40.0000 mg | SUBCUTANEOUS | Status: DC
  Administered 2015-10-20 – 2015-10-21 (×2): 40 mg via SUBCUTANEOUS
  Filled 2015-10-20 (×2): qty 0.4

## 2015-10-20 MED ORDER — ALBUTEROL (5 MG/ML) CONTINUOUS INHALATION SOLN: 10.0000 mg/h | INHALATION_SOLUTION | Freq: Once | RESPIRATORY_TRACT | Status: DC

## 2015-10-20 MED ORDER — ASPIRIN 81 MG PO TABS: 81.0000 mg | ORAL_TABLET | Freq: Every day | ORAL | Status: DC

## 2015-10-20 MED ORDER — FUROSEMIDE 10 MG/ML IJ SOLN
20.0000 mg | INTRAMUSCULAR | Status: AC
  Administered 2015-10-20: 20 mg via INTRAVENOUS
  Filled 2015-10-20: qty 2

## 2015-10-20 MED ORDER — ALBUTEROL SULFATE (2.5 MG/3ML) 0.083% IN NEBU: 2.5000 mg | INHALATION_SOLUTION | RESPIRATORY_TRACT | Status: DC | PRN

## 2015-10-20 MED ORDER — VITAMIN B-1 100 MG PO TABS
100.0000 mg | ORAL_TABLET | Freq: Every day | ORAL | Status: DC
  Administered 2015-10-20 – 2015-10-29 (×10): 100 mg via ORAL
  Filled 2015-10-20 (×10): qty 1

## 2015-10-20 NOTE — Progress Notes (Signed)
10/20/2015 Patient came from the emergency room to 2 central at 1907. He is alert, oriented and ambulatory. Patient came up to 2central with a non re breather at [employer]. He was place on the monitor, received CHG bath, MRSA swab. Notify Elink and central monitor of patient of patient arrival to the unit.  Patient have bruises on arms and chest. Rico Sheehan RN.

## 2015-10-20 NOTE — ED Notes (Signed)
Pt here for worsening SOB over the past month. sts productive cough. Sent here by PCP with low sats around 74% on 2L. Pt 88% at triage on 3L.

## 2015-10-20 NOTE — ED Notes (Signed)
Sats room air 78%. 85% on 5L [place]. Place nonrebreather on pt

## 2015-10-20 NOTE — ED Provider Notes (Signed)
CSN: [id number]     Arrival date & time 10/20/15  1113 History   First MD Initiated Contact with Patient 10/20/15 1231     Chief Complaint  Patient presents with  . Shortness of Breath     (Consider location/radiation/quality/duration/timing/severity/associated sxs/prior Treatment) HPI Comments: 69 year old male with extensive past medical history including COPD, CAD, hypertension, hyperlipidemia who presents with shortness of breath and cough. The patient reports a one-month history of worsening shortness of breath associated with productive cough. He has had mild subjective fevers. Patient notes a 15-20 pound weight gain over the past few months. He was seen at his PCP office where he was noted to have low oxygen saturation and was given Solu-Medrol, breathing treatment, and oxygen prior to transport here. Patient denies any recent treatment for COPD exacerbation. He denies any chest pain or abdominal pain. No vomiting. No sick contacts.  Patient is a 69 y.o. male presenting with shortness of breath. The history is provided by the patient.  Shortness of Breath   Past Medical History  Diagnosis Date  . History of sinusitis   . Epistaxis   . COPD (chronic obstructive pulmonary disease) ([place])   . Cigarette smoker   . Hypertension   . CAD (coronary artery disease)   . Hypercholesteremia   . Borderline diabetes mellitus   . Obesity   . History of colonic polyps 2004    hyperplastic   . DJD (degenerative joint disease)   . Anxiety   . Bladder cancer ([place]) 2011   Past Surgical History  Procedure Laterality Date  . Cataract surgery/sub posterior vitrectomy in left eye  1996    Dr. Zadie Rhine  . Cataract surgery  septemeber 2013  . [facility]  [phone]   Family History  Problem Relation Age of Onset  . Heart disease Mother   . Heart disease Brother   . Cancer Brother   . Aneurysm Brother    Social History  Substance Use Topics  . Smoking status: Current Every Day Smoker -- 0.50  packs/day for 45 years    Types: Cigarettes    Last Attempt to Quit: 05/06/2013  . Smokeless tobacco: Never Used  . Alcohol Use: 0.0 oz/week    0 Standard drinks or equivalent per week     Comment: glass of wine at night    Review of Systems  Respiratory: Positive for shortness of breath.    10 Systems reviewed and are negative for acute change except as noted in the HPI.    Allergies  Dextromethorphan-guaifenesin  Home Medications   Prior to Admission medications   Medication Sig Start Date End Date Taking? Authorizing Provider  ALPRAZolam Duanne Moron) 0.5 MG tablet TAKE ONE TABLET BY MOUTH THREE TIMES DAILY AS NEEDED 07/03/15   Noralee Space, MD  amLODipine (NORVASC) 10 MG tablet TAKE 1 TABLET (10 MG TOTAL) BY MOUTH DAILY. 12/05/14   Noralee Space, MD  aspirin 81 MG tablet Take 81 mg by mouth daily.      Historical Provider, MD  Flaxseed, Linseed, (FLAXSEED OIL) 1000 MG CAPS Take 1 capsule by mouth daily.      Historical Provider, MD  Fluticasone-Salmeterol (ADVAIR DISKUS) 250-50 MCG/DOSE AEPB Inhale 1 puff into the lungs every 12 (twelve) hours. 12/05/14   Noralee Space, MD  Fluticasone-Salmeterol (ADVAIR DISKUS) 250-50 MCG/DOSE AEPB Inhale 1 puff into the lungs 2 (two) times daily. 06/05/15 06/06/15  Noralee Space, MD  ibuprofen (ADVIL,MOTRIN) 600 MG tablet Take 1 tablet (600 mg total)  by mouth daily as needed. 12/05/14   Noralee Space, MD  lisinopril (PRINIVIL,ZESTRIL) 40 MG tablet TAKE 1 TABLET (40 MG TOTAL) BY MOUTH DAILY. 12/05/14   Noralee Space, MD  metoprolol tartrate (LOPRESSOR) 25 MG tablet TAKE TWO TABLETS BY MOUTH IN THE MORNING AND ONE TABLET IN THE EVENING 12/05/13   Noralee Space, MD  Multiple Vitamin (MULTIVITAMIN) capsule Take 1 capsule by mouth daily.      Historical Provider, MD  Multiple Vitamins-Minerals (OCUVITE ADULT 50+ PO) Take 1 tablet by mouth 2 (two) times daily.      Historical Provider, MD  Omega-3 Fatty Acids (FISH OIL) 1200 MG CAPS Take 1 capsule by mouth  daily.      Historical Provider, MD  simvastatin (ZOCOR) 20 MG tablet TAKE 1 TABLET (20 MG TOTAL) BY MOUTH AT BEDTIME. 12/05/14   Noralee Space, MD  tiotropium (SPIRIVA) 18 MCG inhalation capsule Place 1 capsule (18 mcg total) into inhaler and inhale daily. 12/05/14   Noralee Space, MD   BP 123/77 mmHg  Pulse 71  Temp(Src) 98.2 F (36.8 C)  Resp 17  Wt 226 lb (102.513 kg)  SpO2 95% Physical Exam  Constitutional: He is oriented to person, place, and time. He appears well-developed and well-nourished.  Sitting up, increased WOB but NAD  HENT:  Head: Normocephalic and atraumatic.  Moist mucous membranes  Eyes: Conjunctivae are normal. Pupils are equal, round, and reactive to light.  Neck: Neck supple.  Cardiovascular: Normal rate, regular rhythm and normal heart sounds.   No murmur heard. Pulmonary/Chest:  Increased WOB w/ severely diminished BS b/l, faint expiratory wheezes  Abdominal: Soft. Bowel sounds are normal. He exhibits no distension. There is no tenderness.  Musculoskeletal:  Trace b/l LE edema  Neurological: He is alert and oriented to person, place, and time.  Fluent speech  Skin: Skin is warm.  diaphoretic  Psychiatric: He has a normal mood and affect. Judgment normal.  Nursing note and vitals reviewed.   ED Course  .Critical Care Performed by: Sharlett Iles Authorized by: Sharlett Iles Total critical care time: 40 minutes Critical care time was exclusive of separately billable procedures and treating other patients. Critical care was necessary to treat or prevent imminent or life-threatening deterioration of the following conditions: respiratory failure. Critical care was time spent personally by me on the following activities: development of treatment plan with patient or surrogate, evaluation of patient's response to treatment, examination of patient, obtaining history from patient or surrogate, ordering and performing treatments and interventions,  ordering and review of radiographic studies, ordering and review of laboratory studies, pulse oximetry, re-evaluation of patient's condition and review of old charts.   (including critical care time) Labs Review Labs Reviewed  BASIC METABOLIC PANEL - Abnormal; Notable for the following:    Glucose, Bld 122 (*)    All other components within normal limits  CBC - Abnormal; Notable for the following:    MCV 103.8 (*)    RDW 19.9 (*)    Platelets 149 (*)    All other components within normal limits  BRAIN NATRIURETIC PEPTIDE - Abnormal; Notable for the following:    B Natriuretic Peptide 498.4 (*)    All other components within normal limits  I-STAT TROPOININ, ED    Imaging Review Dg Chest 2 View  10/20/2015  CLINICAL DATA:  Shortness of breath for 1 month.  Low-grade fever. EXAM: CHEST  2 VIEW COMPARISON:  June 05, 2015 FINDINGS: There  is stable fibrotic change in the mid and lower lung zones bilaterally. There is bullous disease in the upper lobes. There is no well-defined edema or consolidation. The heart is borderline enlarged. Pulmonary vascularity is stable with decreased vascularity to the upper lobes due the bullous disease in the upper lobes. No adenopathy. No bone lesions. IMPRESSION: Bullous disease is noted in both upper lobes with diminished pulmonary vascularity to the upper lobes. Underlying interstitial fibrosis. There may be some redistribution of blood flow to viable segments of lung contributing to the interstitial prominence in the mid and lower lung zones. No new opacity. No change in cardiac silhouette. Electronically Signed   By: Lowella Grip III M.D.   On: 10/20/2015 12:29   I have personally reviewed and evaluated these lab results as part of my medical decision-making.   EKG Interpretation None     Medications  albuterol (PROVENTIL,VENTOLIN) solution continuous neb (not administered)  magnesium sulfate IVPB 2 g 50 mL (2 g Intravenous New Bag/Given 10/20/15  1322)  azithromycin (ZITHROMAX) tablet 500 mg (not administered)  dexamethasone (DECADRON) injection 10 mg (10 mg Intravenous Given 10/20/15 1322)  furosemide (LASIX) injection 20 mg (20 mg Intravenous Given 10/20/15 1322)    MDM   Final diagnoses:  Chronic obstructive pulmonary disease with acute exacerbation ([place])  Hypoxia   Patient presents with 1 month of worsening shortness of breath and was noted to be hypoxic at PCP office today. On arrival, patient w/ diminished breath sounds bilaterally, 78% on RA. He was placed on NRB. Faint exp wheezes noted. Ace the patient on continuous albuterol and gave magnesium, Decadron, and azithromycin given his productive cough. Obtained chest x-ray which showed some changes consistent with COPD, no obvious infiltrate. Labs notable for BNP 498. Gave the patient '20mg'$  IV lasix.  On reexamination after one hour of continuous albuterol, the patient continued to have a significant oxygen requirement but reported that his work of breathing was improved. He had mild improvement in aeration although continues to have significantly diminished BS. I have ordered a second hour of continuous albuterol. Discussed presentation with [facility] hospitalist, Dr. Tana Coast; I appreciate her assistance with patient's care. Patient admitted to stepdown for further treatment.  Sharlett Iles, MD 10/20/15 [phone]

## 2015-10-20 NOTE — H&P (Signed)
History and Physical        Hospital Admission Note Date: 10/20/2015  Patient name: [name] Medical record number: [id number] Date of birth: Dec 07, 1945 Age: 69 y.o. Gender: male  PCP: [name], MD  Referring physician: Dr. Rex Kras   Chief Complaint:  Shortness of breath, sent from PCPs office with hypoxia  HPI: Patient is a 69 year old male with COPD, CAD, hypertension, hyperlipidemia presented with coughing, hypoxia and shortness of breath, going on for last 1 month. Patient reported that his symptoms started one month ago. Per patient, they have mold in the house and his mother-in-law who has been living in the house has been dusting which flared his symptoms. He also noted subjective fevers, productive cough with yellowish and greenish sputum. Patient reported about 15 pound weight gain over the last few months and swelling of his ankles. The patient was seen at his PCPs office today and was noted to have hypoxia with O2 sats in the 70s. He was given Solu-Medrol, breathing treatments and O2 and patient was transported to ED. At the time of my encounter, patient still has significant wheezing and O2 sats in 80s on 4 L O2 via nasal cannula  ED workup showed unremarkable CBC, BMET, MCV 103.8, BNP elevated at 498.4, troponin 0.01. Chest x-ray showed bullous disease in both upper lobes with diminished pulmonary vascularity to the upper lobes, underlying interstitial fibrosis.   Review of Systems:  Constitutional: + fever, chills, diaphoresis, poor appetite and fatigue.  HEENT: Denies photophobia, eye pain, redness, hearing loss, ear pain, +congestion,  denies sore throat, rhinorrhea, sneezing, mouth sores, trouble swallowing, neck pain, neck stiffness and tinnitus.   Respiratory: please see history of present illness  Cardiovascular: Denies chest pain, palpitations and +  leg swelling.  Gastrointestinal: Denies nausea, vomiting, abdominal pain, diarrhea, constipation, blood in stool and abdominal distention.  Genitourinary: Denies dysuria, urgency, frequency, hematuria, flank pain and difficulty urinating.  Musculoskeletal: Denies myalgias, back pain, joint swelling, arthralgias and gait problem.  Skin: Denies pallor, rash and wound.  Neurological: Denies dizziness, seizures, syncope, weakness, light-headedness, numbness and headaches.  Hematological: Denies adenopathy. Easy bruising, personal or family bleeding history  Psychiatric/Behavioral: Denies suicidal ideation, mood changes, confusion, nervousness, sleep disturbance and agitation  Past Medical History: Past Medical History  Diagnosis Date  . History of sinusitis   . Epistaxis   . COPD (chronic obstructive pulmonary disease) ([place])   . Cigarette smoker   . Hypertension   . CAD (coronary artery disease)   . Hypercholesteremia   . Borderline diabetes mellitus   . Obesity   . History of colonic polyps 2004    hyperplastic   . DJD (degenerative joint disease)   . Anxiety   . Bladder cancer ([place]) 2011    Past Surgical History  Procedure Laterality Date  . Cataract surgery/sub posterior vitrectomy in left eye  1996    Dr. Zadie Rhine  . Cataract surgery  septemeber 2013  . [facility]  [phone]    Medications: Prior to Admission medications   Medication Sig Start Date End Date Taking? Authorizing Provider  ALPRAZolam (XANAX) 0.5 MG tablet TAKE ONE TABLET BY MOUTH THREE TIMES DAILY AS NEEDED  Patient taking differently: TAKE ONE TABLET BY MOUTH THREE TIMES DAILY AS NEEDED for anxiety or sleep 07/03/15  Yes Noralee Space, MD  amLODipine (NORVASC) 10 MG tablet TAKE 1 TABLET (10 MG TOTAL) BY MOUTH DAILY. 12/05/14  Yes Noralee Space, MD  aspirin 81 MG tablet Take 81 mg by mouth at bedtime.    Yes Historical Provider, MD  Flaxseed, Linseed, (FLAXSEED OIL) 1000 MG CAPS Take 1 capsule by mouth daily.      Yes Historical Provider, MD  Fluticasone-Salmeterol (ADVAIR DISKUS) 250-50 MCG/DOSE AEPB Inhale 1 puff into the lungs every 12 (twelve) hours. 12/05/14  Yes Noralee Space, MD  lisinopril (PRINIVIL,ZESTRIL) 40 MG tablet TAKE 1 TABLET (40 MG TOTAL) BY MOUTH DAILY. 12/05/14  Yes Noralee Space, MD  metoprolol tartrate (LOPRESSOR) 25 MG tablet TAKE TWO TABLETS BY MOUTH IN THE MORNING AND ONE TABLET IN THE EVENING Patient taking differently: Take 25-50 mg by mouth 2 (two) times daily. TAKE TWO TABLETS BY MOUTH IN THE MORNING AND ONE TABLET IN THE EVENING 12/05/13  Yes Noralee Space, MD  Multiple Vitamin (MULTIVITAMIN) capsule Take 1 capsule by mouth daily.     Yes Historical Provider, MD  Multiple Vitamins-Minerals (OCUVITE ADULT 50+ PO) Take 1 tablet by mouth 2 (two) times daily.     Yes Historical Provider, MD  Omega-3 Fatty Acids (FISH OIL) 1200 MG CAPS Take 1 capsule by mouth daily.     Yes Historical Provider, MD  simvastatin (ZOCOR) 20 MG tablet TAKE 1 TABLET (20 MG TOTAL) BY MOUTH AT BEDTIME. 12/05/14  Yes Noralee Space, MD  tiotropium (SPIRIVA) 18 MCG inhalation capsule Place 1 capsule (18 mcg total) into inhaler and inhale daily. 12/05/14  Yes Noralee Space, MD  Fluticasone-Salmeterol (ADVAIR DISKUS) 250-50 MCG/DOSE AEPB Inhale 1 puff into the lungs 2 (two) times daily. 06/05/15 06/06/15  Noralee Space, MD  ibuprofen (ADVIL,MOTRIN) 600 MG tablet Take 1 tablet (600 mg total) by mouth daily as needed. Patient taking differently: Take 600 mg by mouth daily as needed for moderate pain.  12/05/14   Noralee Space, MD    Allergies:   Allergies  Allergen Reactions  . Dextromethorphan-Guaifenesin     REACTION: GI upset    Social History:  reports that he has been smoking Cigarettes.  Patient reports that for the last 1 month he has been smoking one to 2 packs per day due to stress. He has a 22.5 pack-year smoking history. He has never used smokeless tobacco. He reports that he drinks alcohol occasionally.  He reports that he does not use illicit drugs. He lives at home with his wife and 27 year old mother-in-law   Family History: Family History  Problem Relation Age of Onset  . Heart disease Mother   . Heart disease Brother   . Cancer Brother   . Aneurysm Brother    patient reported sister has advanced lung cancer  Physical Exam: Blood pressure 112/62, pulse 87, temperature 98.2 F (36.8 C), resp. rate 17, weight 102.513 kg (226 lb), SpO2 87 %. General: Alert, awake, oriented x3, in mild respiratory distress, able to speak in full sentences. HEENT: normocephalic, atraumatic, anicteric sclera, pink conjunctiva, pupils equal and reactive to light and accomodation, oropharynx clear Neck: supple, no masses or lymphadenopathy, no goiter, no bruits  Heart: Regular rate and rhythm, without murmurs, rubs or gallops. Lungs: Diminished breath sounds with diffuse expiratory wheezing Abdomen: Soft, nontender, nondistended, positive bowel sounds, no masses. Extremities: No clubbing,  cyanosis, trace edema with positive pedal pulses. Neuro: Grossly intact, no focal neurological deficits, strength 5/5 upper and lower extremities bilaterally Psych: alert and oriented x 3, normal mood and affect Skin: no rashes or lesions, warm and dry   LABS on Admission:  Basic Metabolic Panel:  Recent Labs Lab 10/20/15 1157  NA 138  K 5.0  CL 102  CO2 29  GLUCOSE 122*  BUN 12  CREATININE 0.81  CALCIUM 9.1   Liver Function Tests: No results for input(s): AST, ALT, ALKPHOS, BILITOT, PROT, ALBUMIN in the last 168 hours. No results for input(s): LIPASE, AMYLASE in the last 168 hours. No results for input(s): AMMONIA in the last 168 hours. CBC:  Recent Labs Lab 10/20/15 1157  WBC 7.9  HGB 15.3  HCT 49.1  MCV 103.8*  PLT 149*   Cardiac Enzymes: No results for input(s): CKTOTAL, CKMB, CKMBINDEX, TROPONINI in the last 168 hours. BNP: Invalid input(s): POCBNP CBG: No results for input(s): GLUCAP in  the last 168 hours.  Radiological Exams on Admission:  Dg Chest 2 View  10/20/2015  CLINICAL DATA:  Shortness of breath for 1 month.  Low-grade fever. EXAM: CHEST  2 VIEW COMPARISON:  June 05, 2015 FINDINGS: There is stable fibrotic change in the mid and lower lung zones bilaterally. There is bullous disease in the upper lobes. There is no well-defined edema or consolidation. The heart is borderline enlarged. Pulmonary vascularity is stable with decreased vascularity to the upper lobes due the bullous disease in the upper lobes. No adenopathy. No bone lesions. IMPRESSION: Bullous disease is noted in both upper lobes with diminished pulmonary vascularity to the upper lobes. Underlying interstitial fibrosis. There may be some redistribution of blood flow to viable segments of lung contributing to the interstitial prominence in the mid and lower lung zones. No new opacity. No change in cardiac silhouette. Electronically Signed   By: Lowella Grip III M.D.   On: 10/20/2015 12:29    *I have personally reviewed the images above*  EKG: Independently reviewed. Rate 78, T-wave inversion in V2 and V3   Assessment/Plan Principal Problem:   Acute respiratory failure (HCC) with hypoxia likely due to COPD exacerbation with ongoing nicotine abuse, clinical pneumonia, possible underlying CHF - Admit to stepdown, place on scheduled bronchodilators, to let her, IV Solu-Medrol, IV Rocephin and Zithromax - Obtain blood cultures, sputum cultures, urine legionella antigen, urine strep antigen - Patient counseled strongly on smoking cessation - Obtain 2-D echocardiogram to rule out underlying CHF, patient was given Lasix 20 mg IV 1 in ed  Active Problems:   HYPERCHOLESTEROLEMIA - Continue statin    Obesity - Patient counseled on diet and weight control    Essential hypertension - Continue lisinopril, beta blocker    Nicotine abuse - Patient counseled strongly on smoking cessation, placed a nicotine  patch  Macrocytosis: Possibly due to alcohol, check B12, folate   DVT prophylaxis: Lovenox  CODE STATUS: Full CODE STATUS  Family Communication: Admission, patients condition and plan of care including tests being ordered have been discussed with the patient  who indicates understanding and agree with the plan and Code Status  Disposition plan: Further plan will depend as patient's clinical course evolves and further radiologic and laboratory data become available. Likely home When ready  Time Spent on Admission: 6 minutes   RAI,RIPUDEEP M.D. [facility] 10/20/2015, 4:47 PM Pager: [phone]  If 7PM-7AM, please contact night-coverage [email or web address] Password [email or web address]

## 2015-10-20 NOTE — ED Notes (Signed)
Heart healthy meal tray ordered @ 17:35

## 2015-10-21 ENCOUNTER — Ambulatory Visit (HOSPITAL_COMMUNITY): Payer: Medicare Other

## 2015-10-21 DIAGNOSIS — E7800 Pure hypercholesterolemia, unspecified: Secondary | ICD-10-CM

## 2015-10-21 DIAGNOSIS — R0600 Dyspnea, unspecified: Secondary | ICD-10-CM

## 2015-10-21 LAB — CBC
HEMATOCRIT: 44.7 % (ref 39.0–52.0)
HEMOGLOBIN: 14.2 g/dL (ref 13.0–17.0)
MCH: 32.5 pg (ref 26.0–34.0)
MCHC: 31.8 g/dL (ref 30.0–36.0)
MCV: 102.3 fL — AB (ref 78.0–100.0)
PLATELETS: 150 10*3/uL (ref 150–400)
RBC: 4.37 MIL/uL (ref 4.22–5.81)
RDW: 19.8 % — ABNORMAL HIGH (ref 11.5–15.5)
WBC: 6 10*3/uL (ref 4.0–10.5)

## 2015-10-21 LAB — EXPECTORATED SPUTUM ASSESSMENT W GRAM STAIN, RFLX TO RESP C

## 2015-10-21 LAB — BASIC METABOLIC PANEL
ANION GAP: 5 (ref 5–15)
BUN: 17 mg/dL (ref 6–20)
CO2: 30 mmol/L (ref 22–32)
Calcium: 8.6 mg/dL — ABNORMAL LOW (ref 8.9–10.3)
Chloride: 100 mmol/L — ABNORMAL LOW (ref 101–111)
Creatinine, Ser: 0.94 mg/dL (ref 0.61–1.24)
GFR calc Af Amer: >60 mL/min (ref >60)
GLUCOSE: 140 mg/dL — AB (ref 65–99)
POTASSIUM: 4.9 mmol/L (ref 3.5–5.1)
Sodium: 135 mmol/L (ref 135–145)

## 2015-10-21 LAB — EXPECTORATED SPUTUM ASSESSMENT W REFEX TO RESP CULTURE

## 2015-10-21 LAB — STREP PNEUMONIAE URINARY ANTIGEN: STREP PNEUMO URINARY ANTIGEN: NEGATIVE

## 2015-10-21 LAB — HIV ANTIBODY (ROUTINE TESTING W REFLEX): HIV Screen 4th Generation wRfx: NONREACTIVE

## 2015-10-21 MED ORDER — CETYLPYRIDINIUM CHLORIDE 0.05 % MT LIQD
7.0000 mL | Freq: Two times a day (BID) | OROMUCOSAL | Status: DC
  Administered 2015-10-21 – 2015-10-28 (×10): 7 mL via OROMUCOSAL

## 2015-10-21 MED ORDER — AMLODIPINE BESYLATE 10 MG PO TABS
10.0000 mg | ORAL_TABLET | Freq: Every day | ORAL | Status: DC
  Administered 2015-10-21 – 2015-10-29 (×9): 10 mg via ORAL
  Filled 2015-10-21 (×9): qty 1

## 2015-10-21 MED ORDER — METHYLPREDNISOLONE SODIUM SUCC 125 MG IJ SOLR
60.0000 mg | Freq: Every day | INTRAMUSCULAR | Status: DC
  Administered 2015-10-22 – 2015-10-28 (×7): 60 mg via INTRAVENOUS
  Filled 2015-10-21 (×7): qty 2

## 2015-10-21 NOTE — Progress Notes (Signed)
Utilization Review Completed.  

## 2015-10-21 NOTE — Progress Notes (Signed)
TEAM 1 - Stepdown/ICU TEAM Progress Note  [name] IRJ:[id number] DOB: 1946-07-04 DOA: 10/20/2015 PCP: [name], MD  Admit HPI / Brief Narrative: 69 year old WM PMHx Anxiety, Bladder Ca, Hx Colonic Polyps (hyperplastic), COPD with continued Tobacco Abuse (1-2 PPD), CAD, HTN, HLD,   Presented with coughing, hypoxia and shortness of breath, going on for last 1 month. Patient reported that his symptoms started one month ago. Per patient, they have mold in the house and his mother-in-law who has been living in the house has been dusting which flared his symptoms. He also noted subjective fevers, productive cough with yellowish and greenish sputum. Patient reported about 15 pound weight gain over the last few months and swelling of his ankles. The patient was seen at his PCPs office today and was noted to have hypoxia with O2 sats in the 70s. He was given Solu-Medrol, breathing treatments and O2 and patient was transported to ED. At the time of my encounter, patient still has significant wheezing and O2 sats in 80s on 4 L O2 via nasal cannula ED workup showed unremarkable CBC, BMET, MCV 103.8, BNP elevated at 498.4, troponin 0.01. Chest x-ray showed bullous disease in both upper lobes with diminished pulmonary vascularity to the upper lobes, underlying interstitial fibrosis.  HPI/Subjective: 12/6 A/O 4, states does not use O2 at home, however does use Spiriva and Advair. Patient states continues to smoke. Wife states that patient is always fatigued.  Assessment/Plan: Acute respiratory failure (HCC) with hypoxia likely due to COPD exacerbation -Ongoing nicotine abuse, clinical pneumonia, possible underlying CHF - Solu-Medrol 60 mg daily -Continue Dulera -DuoNeb QID -Flutter valve - IV Rocephin and Zithromax - Obtain blood cultures pending -sputum cultures pending -Strep pneumo urine antigen/Legionella urine antigen pending  Essential HTN -Given patient's significant  respiratory disease would not use beta blocker;DC metoprolol -Amlodipine 10 mg daily -Lisinopril 40 mg daily -If BP still not controlled would add clonidine -Obtain 2-D echocardiogram to rule out underlying CHF  HYPERCHOLESTEROLEMIA - Simvastatin 20 mg daily -Lipid panel pending  Obesity - Patient counseled on diet and weight control  Nicotine abuse - Patient counseled strongly on smoking cessation, placed a nicotine patch  Macrocytosis:  -Possibly due to alcohol, check B12, folate    Code Status: FULL Family Communication: family present at time of exam Disposition Plan: SNF vs home    Consultants: NA  Procedure/Significant Events: 12/5 CXR;Bullous disease  both upper lobes w/ diminished pulmonary vascularity to the upper lobes. Underlying interstitial fibrosis.   Culture 12/5 MRSA by PCR negative 12/5 blood right AC/wrist NGTD 12/5 urine NGTD 12/5 HIV negative 12/6 strep pneumo urine antigen negative 12/6 respiratory virus panel pending   Antibiotics: Azithromycin 12/5>> Ceftriaxone 12/5>>  DVT prophylaxis: Lovenox   Devices    LINES / TUBES:      Continuous Infusions:   Objective: VITAL SIGNS: Temp: 98.5 F (36.9 C) (12/06 1758) Temp Source: Oral (12/06 1758) BP: 126/64 mmHg (12/06 1758) Pulse Rate: 91 (12/06 1758) SPO2; FIO2:   Intake/Output Summary (Last 24 hours) at 10/21/15 1859 Last data filed at 10/21/15 1700  Gross per 24 hour  Intake   1080 ml  Output   1200 ml  Net   -120 ml     Exam: General: A/O 4, positive  acute respiratory distress Eyes: Negative headache, negative scleral hemorrhage ENT: Negative Runny nose, negative gingival bleeding, Neck:  Negative scars, masses, torticollis, lymphadenopathy, JVD Lungs: diffuse poor air movement, positive expiratory wheezing, negative crackles Cardiovascular: Regular  rate and rhythm without murmur gallop or rub normal S1 and S2 Abdomen:negative abdominal pain, nondistended,  positive soft, bowel sounds, no rebound, no ascites, no appreciable mass Extremities: No significant cyanosis, clubbing, or edema bilateral lower extremities Psychiatric:  Negative depression, negative anxiety, negative fatigue, negative mania  Neurologic:  Cranial nerves II through XII intact, tongue/uvula midline, all extremities muscle strength 5/5, sensation intact throughout,  negative dysarthria, negative expressive aphasia, negative receptive aphasia.   Data Reviewed: Basic Metabolic Panel:  Recent Labs Lab 10/20/15 1157 10/21/15 0748  NA 138 135  K 5.0 4.9  CL 102 100*  CO2 29 30  GLUCOSE 122* 140*  BUN 12 17  CREATININE 0.81 0.94  CALCIUM 9.1 8.6*   Liver Function Tests: No results for input(s): AST, ALT, ALKPHOS, BILITOT, PROT, ALBUMIN in the last 168 hours. No results for input(s): LIPASE, AMYLASE in the last 168 hours. No results for input(s): AMMONIA in the last 168 hours. CBC:  Recent Labs Lab 10/20/15 1157 10/21/15 0315  WBC 7.9 6.0  HGB 15.3 14.2  HCT 49.1 44.7  MCV 103.8* 102.3*  PLT 149* 150   Cardiac Enzymes: No results for input(s): CKTOTAL, CKMB, CKMBINDEX, TROPONINI in the last 168 hours. BNP (last 3 results)  Recent Labs  10/20/15 1157  BNP 498.4*    ProBNP (last 3 results) No results for input(s): PROBNP in the last 8760 hours.  CBG: No results for input(s): GLUCAP in the last 168 hours.  Recent Results (from the past 240 hour(s))  MRSA PCR Screening     Status: None   Collection Time: 10/20/15  7:27 PM  Result Value Ref Range Status   MRSA by PCR NEGATIVE NEGATIVE Final    Comment:        The GeneXpert MRSA Assay (FDA approved for NASAL specimens only), is one component of a comprehensive MRSA colonization surveillance program. It is not intended to diagnose MRSA infection nor to guide or monitor treatment for MRSA infections.   Culture, blood (routine x 2) Call MD if unable to obtain prior to antibiotics being given      Status: None (Preliminary result)   Collection Time: 10/20/15  8:42 PM  Result Value Ref Range Status   Specimen Description BLOOD RIGHT ANTECUBITAL  Final   Special Requests BOTTLES DRAWN AEROBIC AND ANAEROBIC 5CC  Final   Culture NO GROWTH < 24 HOURS  Final   Report Status PENDING  Incomplete  Culture, blood (routine x 2) Call MD if unable to obtain prior to antibiotics being given     Status: None (Preliminary result)   Collection Time: 10/20/15  8:50 PM  Result Value Ref Range Status   Specimen Description BLOOD RIGHT WRIST  Final   Special Requests BOTTLES DRAWN AEROBIC AND ANAEROBIC 10CC  Final   Culture NO GROWTH < 24 HOURS  Final   Report Status PENDING  Incomplete  Urine culture     Status: None (Preliminary result)   Collection Time: 10/20/15 10:35 PM  Result Value Ref Range Status   Specimen Description URINE, CLEAN CATCH  Final   Special Requests NONE  Final   Culture TOO YOUNG TO READ  Final   Report Status PENDING  Incomplete     Studies:  Recent x-ray studies have been reviewed in detail by the Attending Physician  Scheduled Meds:  Scheduled Meds: . amLODipine  10 mg Oral Daily  . antiseptic oral rinse  7 mL Mouth Rinse BID  . aspirin EC  81  mg Oral Daily  . azithromycin  500 mg Intravenous Q24H  . cefTRIAXone (ROCEPHIN)  IV  1 g Intravenous Q24H  . enoxaparin (LOVENOX) injection  40 mg Subcutaneous Q24H  . folic acid  1 mg Oral Daily  . ipratropium-albuterol  3 mL Nebulization QID  . lisinopril  40 mg Oral Daily  . [START ON 10/22/2015] methylPREDNISolone (SOLU-MEDROL) injection  60 mg Intravenous Daily  . mometasone-formoterol  2 puff Inhalation BID  . multivitamin with minerals  1 tablet Oral Daily  . nicotine  21 mg Transdermal Daily  . omega-3 acid ethyl esters  1 g Oral Daily  . pantoprazole  40 mg Oral Q0600  . simvastatin  20 mg Oral q1800  . thiamine  100 mg Oral Daily   Or  . thiamine  100 mg Intravenous Daily    Time spent on care of this  patient: 40 mins   Joya Willmott, Geraldo Docker , MD  [facility] Office  [phone] Pager - [phone]  On-Call/Text Page:      [email or web address]      password [email or web address]  If 7PM-7AM, please contact night-coverage [email or web address] Password [email or web address] 10/21/2015, 6:59 PM   LOS: 1 day   Care during the described time interval was provided by me .  I have reviewed this patient's available data, including medical history, events of note, physical examination, and all test results as part of my evaluation. I have personally reviewed and interpreted all radiology studies.   Dia Crawford, MD [phone] Pager

## 2015-10-21 NOTE — Progress Notes (Signed)
  Echocardiogram 2D Echocardiogram has been performed.  Darlina Sicilian M 10/21/2015, 12:09 PM

## 2015-10-22 DIAGNOSIS — J9601 Acute respiratory failure with hypoxia: Secondary | ICD-10-CM

## 2015-10-22 LAB — CBC WITH DIFFERENTIAL/PLATELET
Basophils Absolute: 0 10*3/uL (ref 0.0–0.1)
Basophils Relative: 0 %
Eosinophils Absolute: 0 10*3/uL (ref 0.0–0.7)
Eosinophils Relative: 0 %
HEMATOCRIT: 45.6 % (ref 39.0–52.0)
HEMOGLOBIN: 13.9 g/dL (ref 13.0–17.0)
LYMPHS ABS: 1.1 10*3/uL (ref 0.7–4.0)
Lymphocytes Relative: 8 %
MCH: 31.7 pg (ref 26.0–34.0)
MCHC: 30.5 g/dL (ref 30.0–36.0)
MCV: 104.1 fL — AB (ref 78.0–100.0)
MONOS PCT: 8 %
Monocytes Absolute: 1.2 10*3/uL — ABNORMAL HIGH (ref 0.1–1.0)
NEUTROS ABS: 12.8 10*3/uL — AB (ref 1.7–7.7)
NEUTROS PCT: 84 %
Platelets: 156 10*3/uL (ref 150–400)
RBC: 4.38 MIL/uL (ref 4.22–5.81)
RDW: 19.7 % — ABNORMAL HIGH (ref 11.5–15.5)
WBC: 14.3 10*3/uL — AB (ref 4.0–10.5)

## 2015-10-22 LAB — RESPIRATORY VIRUS PANEL
Adenovirus: NEGATIVE
INFLUENZA A: NEGATIVE
INFLUENZA B 1: NEGATIVE
METAPNEUMOVIRUS: NEGATIVE
PARAINFLUENZA 1 A: NEGATIVE
Parainfluenza 2: NEGATIVE
Parainfluenza 3: NEGATIVE
RESPIRATORY SYNCYTIAL VIRUS A: NEGATIVE
Respiratory Syncytial Virus B: NEGATIVE
Rhinovirus: NEGATIVE

## 2015-10-22 LAB — URINE CULTURE

## 2015-10-22 LAB — LIPID PANEL
Cholesterol: 138 mg/dL (ref 0–200)
HDL: 25 mg/dL — AB (ref >40)
LDL CALC: 98 mg/dL (ref 0–99)
TRIGLYCERIDES: 75 mg/dL (ref <150)
Total CHOL/HDL Ratio: 5.5 RATIO
VLDL: 15 mg/dL (ref 0–40)

## 2015-10-22 LAB — COMPREHENSIVE METABOLIC PANEL
ALT: 17 U/L (ref 17–63)
AST: 31 U/L (ref 15–41)
Albumin: 3.2 g/dL — ABNORMAL LOW (ref 3.5–5.0)
Alkaline Phosphatase: 39 U/L (ref 38–126)
Anion gap: 5 (ref 5–15)
BILIRUBIN TOTAL: 0.6 mg/dL (ref 0.3–1.2)
BUN: 20 mg/dL (ref 6–20)
CALCIUM: 8.7 mg/dL — AB (ref 8.9–10.3)
CHLORIDE: 102 mmol/L (ref 101–111)
CO2: 31 mmol/L (ref 22–32)
CREATININE: 0.89 mg/dL (ref 0.61–1.24)
Glucose, Bld: 135 mg/dL — ABNORMAL HIGH (ref 65–99)
Potassium: 5.2 mmol/L — ABNORMAL HIGH (ref 3.5–5.1)
Sodium: 138 mmol/L (ref 135–145)
TOTAL PROTEIN: 6.2 g/dL — AB (ref 6.5–8.1)

## 2015-10-22 LAB — LEGIONELLA ANTIGEN, URINE

## 2015-10-22 LAB — LEGIONELLA PNEUMOPHILA SEROGP 1 UR AG: L. PNEUMOPHILA SEROGP 1 UR AG: NEGATIVE

## 2015-10-22 LAB — LACTIC ACID, PLASMA: LACTIC ACID, VENOUS: 2 mmol/L (ref 0.5–2.0)

## 2015-10-22 LAB — MAGNESIUM: MAGNESIUM: 2.2 mg/dL (ref 1.7–2.4)

## 2015-10-22 MED ORDER — ENOXAPARIN SODIUM 40 MG/0.4ML ~~LOC~~ SOLN
40.0000 mg | SUBCUTANEOUS | Status: DC
  Administered 2015-10-22 – 2015-10-28 (×7): 40 mg via SUBCUTANEOUS
  Filled 2015-10-22 (×7): qty 0.4

## 2015-10-22 MED ORDER — SENNOSIDES-DOCUSATE SODIUM 8.6-50 MG PO TABS
1.0000 | ORAL_TABLET | Freq: Two times a day (BID) | ORAL | Status: DC
  Administered 2015-10-22 – 2015-10-24 (×2): 1 via ORAL
  Filled 2015-10-22 (×9): qty 1

## 2015-10-22 MED ORDER — POLYETHYLENE GLYCOL 3350 17 G PO PACK
17.0000 g | PACK | Freq: Every day | ORAL | Status: DC
  Filled 2015-10-22 (×4): qty 1

## 2015-10-22 MED ORDER — FLAXSEED OIL 1000 MG PO CAPS: 1.0000 | ORAL_CAPSULE | Freq: Every day | ORAL | Status: DC

## 2015-10-22 NOTE — Progress Notes (Signed)
[place] TEAM 1 - Stepdown/ICU TEAM PROGRESS NOTE  SAMIT SYLVE WLN:[id number] DOB: [birth year] DOA: 10/20/2015 PCP: [name], MD  Admit HPI / Brief Narrative: 69 year old M Hx Anxiety, Bladder Ca, Colonic Polyps (hyperplastic), COPD with continued Tobacco Abuse (1-2 PPD), CAD, HTN, and HLD who presented with coug, hypoxia, and shortness of breath for 1 month. Per patient, they have mold in the house and his mother-in-law who has been living in the house has been dusting which flared his symptoms. Patient reported about 15 pound weight gain over the last few months and swelling of his ankles. The patient was seen at his PCPs office and was noted to have hypoxia with O2 sats in the 70s. He was given Solu-Medrol, breathing treatments and O2 and transported to ED.  In the ED Chest x-ray showed bullous disease in both upper lobes with diminished pulmonary vascularity to the upper lobes, underlying interstitial fibrosis.  HPI/Subjective: The patient states he feels somewhat less short of breath today.  He denies chest pain nausea vomiting or abdominal pain.  He denies a headache.  He states he does not use oxygen at home.  He admits to ongoing tobacco abuse.  Assessment/Plan:  Acute respiratory failure with hypoxia likely due to COPD exacerbation -Ongoing smoking -Strep pneumo urine antigen / Legionella urine antigen both negative -less wheezing today - cont current tx - add flutter valve - slowly improving  -wean O2 as able - not yet on home O2 - may require temporary home O2 support at d/c    Essential HTN -BP now well controlled   HLD  -Simvastatin 20 mg daily -LDL 98 / HDL 25  Obesity - Body mass index is 35.14 kg/(m^2). -Patient counseled on diet and weight control  Nicotine abuse -Patient counseled again strongly on smoking cessation - nicotine patch  Macrocytosis w/o anemia  -folate and B12 normal/high   Code Status: FULL Family Communication: Spoke with patient and  wife at bedside Disposition Plan: SDU  Consultants: none  Procedures: 12/6 TTE - mild LVH - EF 55-60 % - no WMA - LA mod dilated - RV mod dilated - RA severely dilated - PA pressure 51 mmHg  Antibiotics: Azithromycin 12/5 > Ceftriaxone 12/5 >  DVT prophylaxis: lovenox   Objective: Blood pressure 119/59, pulse 88, temperature 98.3 F (36.8 C), temperature source Oral, resp. rate 20, height '5\' 6"'$  (1.676 m), weight 98.7 kg (217 lb 9.5 oz), SpO2 94 %.  Intake/Output Summary (Last 24 hours) at 10/22/15 1437 Last data filed at 10/22/15 1323  Gross per 24 hour  Intake    780 ml  Output   2160 ml  Net  -1380 ml   Exam: General: No acute respiratory distress at rest in bed  Lungs: Poor air movement throughout all fields, mild expiratory wheeze, no focal crackles Cardiovascular: Heart sounds distant - Regular rate and rhythm without murmur gallop or rub normal S1 and S2 Abdomen: Nontender, overweight, soft, bowel sounds positive, no rebound, no ascites, no appreciable mass Extremities: No significant cyanosis, clubbing, or edema bilateral lower extremities  Data Reviewed: Basic Metabolic Panel:  Recent Labs Lab 10/20/15 1157 10/21/15 0748 10/22/15 0309  NA 138 135 138  K 5.0 4.9 5.2*  CL 102 100* 102  CO2 '29 30 31  '$ GLUCOSE 122* 140* 135*  BUN '12 17 20  '$ CREATININE 0.81 0.94 0.89  CALCIUM 9.1 8.6* 8.7*  MG  --   --  2.2    CBC:  Recent Labs Lab 10/20/15  1157 10/21/15 0315 10/22/15 0309  WBC 7.9 6.0 14.3*  NEUTROABS  --   --  12.8*  HGB 15.3 14.2 13.9  HCT 49.1 44.7 45.6  MCV 103.8* 102.3* 104.1*  PLT 149* 150 156    Liver Function Tests:  Recent Labs Lab 10/22/15 0309  AST 31  ALT 17  ALKPHOS 39  BILITOT 0.6  PROT 6.2*  ALBUMIN 3.2*    Recent Results (from the past 240 hour(s))  MRSA PCR Screening     Status: None   Collection Time: 10/20/15  7:27 PM  Result Value Ref Range Status   MRSA by PCR NEGATIVE NEGATIVE Final    Comment:        The  GeneXpert MRSA Assay (FDA approved for NASAL specimens only), is one component of a comprehensive MRSA colonization surveillance program. It is not intended to diagnose MRSA infection nor to guide or monitor treatment for MRSA infections.   Culture, blood (routine x 2) Call MD if unable to obtain prior to antibiotics being given     Status: None (Preliminary result)   Collection Time: 10/20/15  8:42 PM  Result Value Ref Range Status   Specimen Description BLOOD RIGHT ANTECUBITAL  Final   Special Requests BOTTLES DRAWN AEROBIC AND ANAEROBIC 5CC  Final   Culture NO GROWTH 2 DAYS  Final   Report Status PENDING  Incomplete  Culture, blood (routine x 2) Call MD if unable to obtain prior to antibiotics being given     Status: None (Preliminary result)   Collection Time: 10/20/15  8:50 PM  Result Value Ref Range Status   Specimen Description BLOOD RIGHT WRIST  Final   Special Requests BOTTLES DRAWN AEROBIC AND ANAEROBIC 10CC  Final   Culture NO GROWTH 2 DAYS  Final   Report Status PENDING  Incomplete  Urine culture     Status: None   Collection Time: 10/20/15 10:35 PM  Result Value Ref Range Status   Specimen Description URINE, CLEAN CATCH  Final   Special Requests NONE  Final   Culture MULTIPLE SPECIES PRESENT, SUGGEST RECOLLECTION  Final   Report Status 10/22/2015 FINAL  Final  Culture, blood (routine x 2)     Status: None (Preliminary result)   Collection Time: 10/21/15  2:10 PM  Result Value Ref Range Status   Specimen Description BLOOD LEFT ARM  Final   Special Requests BOTTLES DRAWN AEROBIC ONLY 3CCS  Final   Culture NO GROWTH < 24 HOURS  Final   Report Status PENDING  Incomplete  Culture, blood (routine x 2)     Status: None (Preliminary result)   Collection Time: 10/21/15  2:15 PM  Result Value Ref Range Status   Specimen Description BLOOD LEFT HAND  Final   Special Requests IN PEDIATRIC BOTTLE 2.5CCS  Final   Culture NO GROWTH < 24 HOURS  Final   Report Status PENDING   Incomplete  Culture, expectorated sputum-assessment     Status: None   Collection Time: 10/21/15  8:24 PM  Result Value Ref Range Status   Specimen Description SPUTUM  Final   Special Requests NONE  Final   Sputum evaluation   Final    MICROSCOPIC FINDINGS SUGGEST THAT THIS SPECIMEN IS NOT REPRESENTATIVE OF LOWER RESPIRATORY SECRETIONS. PLEASE RECOLLECT. CRITICAL RESULT CALLED TO, READ BACK BY AND VERIFIED WITH: CHRIS [id number] 10/21/15 mkelly    Report Status 10/21/2015 FINAL  Final     Studies:   Recent x-ray studies have been reviewed in detail  by the Attending Physician  Scheduled Meds:  Scheduled Meds: . amLODipine  10 mg Oral Daily  . antiseptic oral rinse  7 mL Mouth Rinse BID  . aspirin EC  81 mg Oral Daily  . azithromycin  500 mg Intravenous Q24H  . cefTRIAXone (ROCEPHIN)  IV  1 g Intravenous Q24H  . enoxaparin (LOVENOX) injection  40 mg Subcutaneous Q24H  . folic acid  1 mg Oral Daily  . ipratropium-albuterol  3 mL Nebulization QID  . lisinopril  40 mg Oral Daily  . methylPREDNISolone (SOLU-MEDROL) injection  60 mg Intravenous Daily  . mometasone-formoterol  2 puff Inhalation BID  . multivitamin with minerals  1 tablet Oral Daily  . nicotine  21 mg Transdermal Daily  . omega-3 acid ethyl esters  1 g Oral Daily  . pantoprazole  40 mg Oral Q0600  . simvastatin  20 mg Oral q1800  . thiamine  100 mg Oral Daily   Or  . thiamine  100 mg Intravenous Daily    Time spent on care of this patient: 35 mins   Eliberto Sole T , MD   [facility] Office  [phone] Pager - Text Page per Shea Evans as per below:  On-Call/Text Page:      [email or web address]      password [email or web address]  If 7PM-7AM, please contact night-coverage [email or web address] Password [email or web address] 10/22/2015, 2:37 PM   LOS: 2 days

## 2015-10-22 NOTE — Progress Notes (Signed)
PHARMACIST - PHYSICIAN ORDER COMMUNICATION  CONCERNING: P&T Medication Policy on Herbal Medications  DESCRIPTION:  This patient's order for:  Flaxseed oil  has been noted.  This product(s) is classified as an "herbal" or natural product. Due to a lack of definitive safety studies or FDA approval, nonstandard manufacturing practices, plus the potential risk of unknown drug-drug interactions while on inpatient medications, the Pharmacy and Therapeutics Committee does not permit the use of "herbal" or natural products of this type within [facility].   ACTION TAKEN: The pharmacy department is unable to verify this order at this time and your patient has been informed of this safety policy. Please reevaluate patient's clinical condition at discharge and address if the herbal or natural product(s) should be resumed at that time.

## 2015-10-23 LAB — CBC
HEMATOCRIT: 47.5 % (ref 39.0–52.0)
HEMOGLOBIN: 14.3 g/dL (ref 13.0–17.0)
MCH: 31.1 pg (ref 26.0–34.0)
MCHC: 30.1 g/dL (ref 30.0–36.0)
MCV: 103.3 fL — AB (ref 78.0–100.0)
Platelets: 166 10*3/uL (ref 150–400)
RBC: 4.6 MIL/uL (ref 4.22–5.81)
RDW: 19.4 % — ABNORMAL HIGH (ref 11.5–15.5)
WBC: 13.1 10*3/uL — AB (ref 4.0–10.5)

## 2015-10-23 LAB — COMPREHENSIVE METABOLIC PANEL
ALK PHOS: 39 U/L (ref 38–126)
ALT: 26 U/L (ref 17–63)
AST: 33 U/L (ref 15–41)
Albumin: 3.2 g/dL — ABNORMAL LOW (ref 3.5–5.0)
Anion gap: 9 (ref 5–15)
BILIRUBIN TOTAL: 1 mg/dL (ref 0.3–1.2)
BUN: 17 mg/dL (ref 6–20)
CHLORIDE: 99 mmol/L — AB (ref 101–111)
CO2: 30 mmol/L (ref 22–32)
CREATININE: 0.92 mg/dL (ref 0.61–1.24)
Calcium: 8.6 mg/dL — ABNORMAL LOW (ref 8.9–10.3)
GFR calc Af Amer: >60 mL/min (ref >60)
Glucose, Bld: 107 mg/dL — ABNORMAL HIGH (ref 65–99)
Potassium: 4.6 mmol/L (ref 3.5–5.1)
Sodium: 138 mmol/L (ref 135–145)
Total Protein: 6.5 g/dL (ref 6.5–8.1)

## 2015-10-23 NOTE — Progress Notes (Signed)
[facility] TEAM 1 - Stepdown/ICU TEAM Progress Note  [name] RUE:[id number] DOB: June 13, 1946 DOA: 10/20/2015 PCP: [name], MD  Admit HPI / Brief Narrative: 69 year old WM PMHx Anxiety, Bladder Ca, Hx Colonic Polyps (hyperplastic), COPD with continued Tobacco Abuse (1-2 PPD), CAD, HTN, HLD,   Presented with coughing, hypoxia and shortness of breath, going on for last 1 month. Patient reported that his symptoms started one month ago. Per patient, they have mold in the house and his mother-in-law who has been living in the house has been dusting which flared his symptoms. He also noted subjective fevers, productive cough with yellowish and greenish sputum. Patient reported about 15 pound weight gain over the last few months and swelling of his ankles. The patient was seen at his PCPs office today and was noted to have hypoxia with O2 sats in the 70s. He was given Solu-Medrol, breathing treatments and O2 and patient was transported to ED. At the time of my encounter, patient still has significant wheezing and O2 sats in 80s on 4 L O2 via nasal cannula ED workup showed unremarkable CBC, BMET, MCV 103.8, BNP elevated at 498.4, troponin 0.01. Chest x-ray showed bullous disease in both upper lobes with diminished pulmonary vascularity to the upper lobes, underlying interstitial fibrosis.  HPI/Subjective: 12/8 A/O 4, patient able to walk to and from the bathroom today, sitting in chair for while. Patient still having positive DOE. States he understands he must stop smoking.  Assessment/Plan: Acute respiratory failure (HCC) with hypoxia likely due to COPD exacerbation -Ongoing nicotine abuse, clinical pneumonia, possible underlying CHF - Solu-Medrol 60 mg daily -Continue Dulera -DuoNeb QID -Flutter valve - IV Rocephin and Zithromax - Obtain blood cultures pending -sputum cultures pending -Strep pneumo urine antigen/Legionella urine antigen negative  Essential HTN -Given patient's  significant respiratory disease would not use beta blocker;DC metoprolol -Amlodipine 10 mg daily -Lisinopril 40 mg daily -If BP still not controlled would add clonidine -Obtain 2-D echocardiogram to rule out underlying CHF  HYPERCHOLESTEROLEMIA - Simvastatin 20 mg daily -Lipid panel pending  Obesity - Patient counseled on diet and weight control  Nicotine abuse - Patient counseled strongly on smoking cessation, placed a nicotine patch  Macrocytosis:  -Possibly due to alcohol, check B12, folate    Code Status: FULL Family Communication: family present at time of exam Disposition Plan: SNF vs home    Consultants: NA  Procedure/Significant Events: 12/5 CXR;Bullous disease  both upper lobes w/ diminished pulmonary vascularity to the upper lobes. Underlying interstitial fibrosis.   Culture 12/5 MRSA by PCR negative 12/5 blood right AC/wrist NGTD 12/5 urine positive multiple species 12/5 HIV negative 12/6 strep pneumo urine antigen negative 12/6 respiratory virus panel negative 12/6 blood left arm/hand NGTD   Antibiotics: Azithromycin 12/5>> Ceftriaxone 12/5>>  DVT prophylaxis: Lovenox   Devices    LINES / TUBES:      Continuous Infusions:   Objective: VITAL SIGNS: Temp: 98 F (36.7 C) (12/08 1200) Temp Source: Oral (12/08 1200) BP: 137/63 mmHg (12/08 1200) Pulse Rate: 97 (12/08 0941) SPO2; FIO2:   Intake/Output Summary (Last 24 hours) at 10/23/15 1423 Last data filed at 10/23/15 1007  Gross per 24 hour  Intake    540 ml  Output   1800 ml  Net  -1260 ml     Exam: General: A/O 4, positive  acute respiratory distress (significantly improved) Eyes: Negative headache, negative scleral hemorrhage ENT: Negative Runny nose, negative gingival bleeding, Neck:  Negative scars, masses, torticollis, lymphadenopathy, JVD Lungs:  diffuse poor air movement (improved from 48 hours ago), negative expiratory wheezing, negative crackles Cardiovascular:  Regular rate and rhythm without murmur gallop or rub normal S1 and S2 Abdomen:negative abdominal pain, nondistended, positive soft, bowel sounds, no rebound, no ascites, no appreciable mass Extremities: No significant cyanosis, clubbing, or edema bilateral lower extremities Psychiatric:  Negative depression, negative anxiety, negative fatigue, negative mania  Neurologic:  Cranial nerves II through XII intact, tongue/uvula midline, all extremities muscle strength 5/5, sensation intact throughout,  negative dysarthria, negative expressive aphasia, negative receptive aphasia.   Data Reviewed: Basic Metabolic Panel:  Recent Labs Lab 10/20/15 1157 10/21/15 0748 10/22/15 0309 10/23/15 0345  NA 138 135 138 138  K 5.0 4.9 5.2* 4.6  CL 102 100* 102 99*  CO2 '29 30 31 30  '$ GLUCOSE 122* 140* 135* 107*  BUN '12 17 20 17  '$ CREATININE 0.81 0.94 0.89 0.92  CALCIUM 9.1 8.6* 8.7* 8.6*  MG  --   --  2.2  --    Liver Function Tests:  Recent Labs Lab 10/22/15 0309 10/23/15 0345  AST 31 33  ALT 17 26  ALKPHOS 39 39  BILITOT 0.6 1.0  PROT 6.2* 6.5  ALBUMIN 3.2* 3.2*   No results for input(s): LIPASE, AMYLASE in the last 168 hours. No results for input(s): AMMONIA in the last 168 hours. CBC:  Recent Labs Lab 10/20/15 1157 10/21/15 0315 10/22/15 0309 10/23/15 0345  WBC 7.9 6.0 14.3* 13.1*  NEUTROABS  --   --  12.8*  --   HGB 15.3 14.2 13.9 14.3  HCT 49.1 44.7 45.6 47.5  MCV 103.8* 102.3* 104.1* 103.3*  PLT 149* 150 156 166   Cardiac Enzymes: No results for input(s): CKTOTAL, CKMB, CKMBINDEX, TROPONINI in the last 168 hours. BNP (last 3 results)  Recent Labs  10/20/15 1157  BNP 498.4*    ProBNP (last 3 results) No results for input(s): PROBNP in the last 8760 hours.  CBG: No results for input(s): GLUCAP in the last 168 hours.  Recent Results (from the past 240 hour(s))  MRSA PCR Screening     Status: None   Collection Time: 10/20/15  7:27 PM  Result Value Ref Range  Status   MRSA by PCR NEGATIVE NEGATIVE Final    Comment:        The GeneXpert MRSA Assay (FDA approved for NASAL specimens only), is one component of a comprehensive MRSA colonization surveillance program. It is not intended to diagnose MRSA infection nor to guide or monitor treatment for MRSA infections.   Culture, blood (routine x 2) Call MD if unable to obtain prior to antibiotics being given     Status: None (Preliminary result)   Collection Time: 10/20/15  8:42 PM  Result Value Ref Range Status   Specimen Description BLOOD RIGHT ANTECUBITAL  Final   Special Requests BOTTLES DRAWN AEROBIC AND ANAEROBIC 5CC  Final   Culture NO GROWTH 3 DAYS  Final   Report Status PENDING  Incomplete  Culture, blood (routine x 2) Call MD if unable to obtain prior to antibiotics being given     Status: None (Preliminary result)   Collection Time: 10/20/15  8:50 PM  Result Value Ref Range Status   Specimen Description BLOOD RIGHT WRIST  Final   Special Requests BOTTLES DRAWN AEROBIC AND ANAEROBIC 10CC  Final   Culture NO GROWTH 3 DAYS  Final   Report Status PENDING  Incomplete  Urine culture     Status: None   Collection Time:  10/20/15 10:35 PM  Result Value Ref Range Status   Specimen Description URINE, CLEAN CATCH  Final   Special Requests NONE  Final   Culture MULTIPLE SPECIES PRESENT, SUGGEST RECOLLECTION  Final   Report Status 10/22/2015 FINAL  Final  Respiratory virus panel     Status: None   Collection Time: 10/21/15 12:57 PM  Result Value Ref Range Status   Source - RVPAN NASOPHARYNGEAL  Final   Respiratory Syncytial Virus A Negative Negative Final   Respiratory Syncytial Virus B Negative Negative Final   Influenza A Negative Negative Final   Influenza B Negative Negative Final   Parainfluenza 1 Negative Negative Final   Parainfluenza 2 Negative Negative Final   Parainfluenza 3 Negative Negative Final   Metapneumovirus Negative Negative Final   Rhinovirus Negative Negative Final     Adenovirus Negative Negative Final    Comment: (NOTE) Performed At: [facility] [address] [place], [place] [id number] Lindon Romp MD CW:[id number]   Culture, blood (routine x 2)     Status: None (Preliminary result)   Collection Time: 10/21/15  2:10 PM  Result Value Ref Range Status   Specimen Description BLOOD LEFT ARM  Final   Special Requests BOTTLES DRAWN AEROBIC ONLY 3CCS  Final   Culture NO GROWTH 2 DAYS  Final   Report Status PENDING  Incomplete  Culture, blood (routine x 2)     Status: None (Preliminary result)   Collection Time: 10/21/15  2:15 PM  Result Value Ref Range Status   Specimen Description BLOOD LEFT HAND  Final   Special Requests IN PEDIATRIC BOTTLE 2.5CCS  Final   Culture NO GROWTH 2 DAYS  Final   Report Status PENDING  Incomplete  Culture, expectorated sputum-assessment     Status: None   Collection Time: 10/21/15  8:24 PM  Result Value Ref Range Status   Specimen Description SPUTUM  Final   Special Requests NONE  Final   Sputum evaluation   Final    MICROSCOPIC FINDINGS SUGGEST THAT THIS SPECIMEN IS NOT REPRESENTATIVE OF LOWER RESPIRATORY SECRETIONS. PLEASE RECOLLECT. CRITICAL RESULT CALLED TO, READ BACK BY AND VERIFIED WITH: CHRIS [id number] 10/21/15 mkelly    Report Status 10/21/2015 FINAL  Final     Studies:  Recent x-ray studies have been reviewed in detail by the Attending Physician  Scheduled Meds:  Scheduled Meds: . amLODipine  10 mg Oral Daily  . antiseptic oral rinse  7 mL Mouth Rinse BID  . aspirin EC  81 mg Oral Daily  . azithromycin  500 mg Intravenous Q24H  . cefTRIAXone (ROCEPHIN)  IV  1 g Intravenous Q24H  . enoxaparin (LOVENOX) injection  40 mg Subcutaneous Q24H  . folic acid  1 mg Oral Daily  . ipratropium-albuterol  3 mL Nebulization QID  . lisinopril  40 mg Oral Daily  . methylPREDNISolone (SOLU-MEDROL) injection  60 mg Intravenous Daily  . mometasone-formoterol  2 puff Inhalation BID  . multivitamin with  minerals  1 tablet Oral Daily  . nicotine  21 mg Transdermal Daily  . omega-3 acid ethyl esters  1 g Oral Daily  . pantoprazole  40 mg Oral Q0600  . polyethylene glycol  17 g Oral Daily  . senna-docusate  1 tablet Oral BID  . simvastatin  20 mg Oral q1800  . thiamine  100 mg Oral Daily   Or  . thiamine  100 mg Intravenous Daily    Time spent on care of this patient: 40 mins  Allie Bossier , MD  [facility] Office  [phone] Pager [phone]  On-Call/Text Page:      [email or web address]      password [email or web address]  If 7PM-7AM, please contact night-coverage [email or web address] Password [email or web address] 10/23/2015, 2:23 PM   LOS: 3 days   Care during the described time interval was provided by me .  I have reviewed this patient's available data, including medical history, events of note, physical examination, and all test results as part of my evaluation. I have personally reviewed and interpreted all radiology studies.   Dia Crawford, MD [phone] Pager

## 2015-10-24 MED ORDER — AZITHROMYCIN 500 MG PO TABS
500.0000 mg | ORAL_TABLET | Freq: Every day | ORAL | Status: DC
  Administered 2015-10-24 – 2015-10-25 (×2): 500 mg via ORAL
  Filled 2015-10-24 (×3): qty 1

## 2015-10-24 MED ORDER — FUROSEMIDE 10 MG/ML IJ SOLN
40.0000 mg | Freq: Once | INTRAMUSCULAR | Status: AC
  Administered 2015-10-24: 40 mg via INTRAVENOUS
  Filled 2015-10-24: qty 4

## 2015-10-24 MED ORDER — LISINOPRIL 20 MG PO TABS
20.0000 mg | ORAL_TABLET | Freq: Every day | ORAL | Status: DC
  Administered 2015-10-25 – 2015-10-27 (×3): 20 mg via ORAL
  Filled 2015-10-24 (×4): qty 1

## 2015-10-24 NOTE — Progress Notes (Signed)
Occupational Therapy Evaluation Patient Details Name: [name] MRN: [id number] DOB: [birth year] Today's Date: 10/24/2015    History of Present Illness 69 year old M Hx Anxiety, Bladder Ca, Colonic Polyps (hyperplastic), COPD with continued Tobacco Abuse (1-2 PPD), CAD, HTN, and HLD who presented with coug, hypoxia, and shortness of breath for 1 month.  hypoxia with O2 sats in the 70s. He was given Solu-Medrol, breathing treatments and O2 and transported to ED. In the ED Chest x-ray showed bullous disease in both upper lobes with diminished pulmonary vascularity to the upper lobes, underlying interstitial fibrosis.   Clinical Impression   PTA, pt independent with ADL and mobility and very active. Pt currently requires A for ADL and is limited by poor endurance  -on 3L O2. Dypsnea 2/4 with minimal activity. Will benefit from OT for education on energy conservation and reducing risk of falls to facilitate safe D/C home.    Follow Up Recommendations  No OT follow up;Supervision - Intermittent    Equipment Recommendations  3 in 1 bedside comode    Recommendations for Other Services       Precautions / Restrictions Precautions Precautions: Fall Restrictions Weight Bearing Restrictions: No      Mobility Bed Mobility Overal bed mobility: Independent             General bed mobility comments: pt sitting EOB  Transfers Overall transfer level: Needs assistance Equipment used: Rolling walker (2 wheeled)   Sit to Stand: Min guard         General transfer comment: Pt needed steadying assist. Pt declined mobility due to being "so tired from walking earlier"    Balance                                            ADL Overall ADL's : Needs assistance/impaired                                     Functional mobility during ADLs: Min guard General ADL Comments: Pt overall requires set up /supervision with ADL due to SOB with minimal  activity. Began education regarding E conservation and discussed need to modify activities given COPD. Will continue to assess on next visit. discussed using 3 in 1 for bathing. Pt states he does not have a 3 in 1.      Vision     Perception     Praxis      Pertinent Vitals/Pain Pain Assessment: No/denies pain     Hand Dominance Right   Extremity/Trunk Assessment Upper Extremity Assessment Upper Extremity Assessment: Overall WFL for tasks assessed   Lower Extremity Assessment Lower Extremity Assessment: Defer to PT evaluation   Cervical / Trunk Assessment Cervical / Trunk Assessment: Normal   Communication Communication Communication: No difficulties   Cognition Arousal/Alertness: Awake/alert Behavior During Therapy: WFL for tasks assessed/performed Overall Cognitive Status: Within Functional Limits for tasks assessed                     General Comments       Exercises       Shoulder Instructions      Home Living Family/patient expects to be discharged to:: Private residence Living Arrangements: Spouse/significant other Available Help at Discharge: Family;Available 24 hours/day Type of Home: House Home Access: Stairs to  enter Entrance Stairs-Number of Steps: 3 Entrance Stairs-Rails: Right;Left;Can reach both Home Layout: One level     Bathroom Shower/Tub: Tub/shower unit;Door   [employer] Toilet: Programmer, systems: Yes   Home Equipment: Bedside commode;Walker - standard;Cane - single point;Grab bars - tub/shower   Additional Comments: ? pt states he does not have a BSC - this is his mother in laws      Prior Functioning/Environment Level of Independence: Independent        Comments: very active    OT Diagnosis: Generalized weakness   OT Problem List: Decreased activity tolerance;Decreased knowledge of use of DME or AE;Cardiopulmonary status limiting activity;Obesity   OT Treatment/Interventions: Self-care/ADL  training;Energy conservation;Therapeutic activities;Patient/family education    OT Goals(Current goals can be found in the care plan section) Acute Rehab OT Goals Patient Stated Goal: to get better OT Goal Formulation: With patient Time For Goal Achievement: 11/07/15 Potential to Achieve Goals: Good ADL Goals Additional ADL Goal #1: Pt verbalize understadnign of 3 energy conservation techniques for ADL Additional ADL Goal #2: maintain O2 greater than 90 during ADL task  OT Frequency: Min 2X/week   Barriers to D/C:            Co-evaluation              End of Session Equipment Utilized During Treatment: Oxygen (3L) Nurse Communication: Mobility status  Activity Tolerance: Patient limited by fatigue Patient left: in bed;with call bell/phone within reach   Time: 1659-1715 OT Time Calculation (min): 16 min Charges:  OT General Charges $OT Visit: 1 Procedure G-Codes:    [name],HILLARY 11/13/15, 5:38 PM   [facility], OTR/L  [phone] 11-13-15

## 2015-10-24 NOTE — Evaluation (Signed)
Physical Therapy Evaluation Patient Details Name: [name] MRN: [id number] DOB: 11/19/1945 Today's Date: 10/24/2015   History of Present Illness  69 year old M Hx Anxiety, Bladder Ca, Colonic Polyps (hyperplastic), COPD with continued Tobacco Abuse (1-2 PPD), CAD, HTN, and HLD who presented with coug, hypoxia, and shortness of breath for 1 month. Per patient, they have mold in the house and his mother-in-law who has been living in the house has been dusting which flared his symptoms. Patient reported about 15 pound weight gain over the last few months and swelling of his ankles. The patient was seen at his PCPs office and was noted to have hypoxia with O2 sats in the 70s. He was given Solu-Medrol, breathing treatments and O2 and transported to ED. In the ED Chest x-ray showed bullous disease in both upper lobes with diminished pulmonary vascularity to the upper lobes, underlying interstitial fibrosis.  Clinical Impression  Pt admitted with above diagnosis. Pt currently with functional limitations due to the deficits listed below (see PT Problem List). Pt will need Outpt PT f/u to progress endurance.  Pt will also need RW.  Home O2 as well.   Will follow acutely.  Pt will benefit from skilled PT to increase their independence and safety with mobility to allow discharge to the venue listed below.     Follow Up Recommendations Supervision/Assistance - 24 hour;Outpatient PT    Equipment Recommendations  Rolling walker with 5" wheels, home O2   Recommendations for Other Services       Precautions / Restrictions Precautions Precautions: Fall Restrictions Weight Bearing Restrictions: No      Mobility  Bed Mobility Overal bed mobility: Independent                Transfers Overall transfer level: Needs assistance Equipment used: Rolling walker (2 wheeled) Transfers: Sit to/from Stand Sit to Stand: Min guard         General transfer comment: Pt needed steadying  assist.  Ambulation/Gait Ambulation/Gait assistance: Min guard;+2 safety/equipment Ambulation Distance (Feet): 100 Feet Assistive device: Rolling walker (2 wheeled) Gait Pattern/deviations: Step-through pattern;Decreased stride length;Drifts right/left;Wide base of support   Gait velocity interpretation: Below normal speed for age/gender General Gait Details: Pt was able to ambulate with RW and was generally unsteady at times needing the support of the RW.  Pt desats unless on 6LO2.    Stairs            Wheelchair Mobility    Modified Rankin (Stroke Patients Only)        Balance Overall balance assessment: Needs assistance Sitting-balance support: No upper extremity supported;Feet supported Sitting balance-Leahy Scale: Good     Standing balance support: Bilateral upper extremity supported;During functional activity Standing balance-Leahy Scale: Poor Standing balance comment: relies on RW for balance.                               Pertinent Vitals/Pain Pain Assessment: No/denies pain     SATURATION QUALIFICATIONS: (This note is used to comply with regulatory documentation for home oxygen)  Patient Saturations on Room Air at Rest = 85-88%  Patient Saturations on Room Air while Ambulating = Unable to assess as pt was desating down to 85% on RA at rest%  Patient Saturations on 4 Liters of oxygen while Ambulating = 86-89%  Patient Saturations on 6 Liters of oxygen while Ambulating = 89-92%   Please briefly explain why patient needs home oxygen:Pt desats on  RA at rest. Pt desats with O2 with ambulation unless incr to 6Liters of O2 and also needed cues for pursed lip breathing to keep sats >90%. Home Living Family/patient expects to be discharged to:: Private residence Living Arrangements: Spouse/significant other Available Help at Discharge: Family;Available 24 hours/day Type of Home: House Home Access: Stairs to enter Entrance Stairs-Rails:  Right;Left;Can reach both Entrance Stairs-Number of Steps: 3 Home Layout: One level Home Equipment: Bedside commode;Walker - standard;Cane - single point;Grab bars - tub/shower      Prior Function Level of Independence: Independent               Hand Dominance   Dominant Hand: Right    Extremity/Trunk Assessment   Upper Extremity Assessment: Defer to OT evaluation           Lower Extremity Assessment: Overall WFL for tasks assessed      Cervical / Trunk Assessment: Normal  Communication   Communication: No difficulties  Cognition Arousal/Alertness: Awake/alert Behavior During Therapy: WFL for tasks assessed/performed Overall Cognitive Status: Within Functional Limits for tasks assessed                      General Comments      Exercises        Assessment/Plan    PT Assessment Patient needs continued PT services  PT Diagnosis Generalized weakness   PT Problem List Decreased activity tolerance;Decreased balance;Decreased mobility;Decreased knowledge of use of DME;Decreased safety awareness;Decreased knowledge of precautions;Cardiopulmonary status limiting activity  PT Treatment Interventions DME instruction;Gait training;Functional mobility training;Therapeutic activities;Therapeutic exercise;Balance training;Patient/family education;Stair training   PT Goals (Current goals can be found in the Care Plan section) Acute Rehab PT Goals Patient Stated Goal: to get better PT Goal Formulation: With patient Time For Goal Achievement: 11/07/15 Potential to Achieve Goals: Good    Frequency Min 3X/week   Barriers to discharge        Co-evaluation               End of Session Equipment Utilized During Treatment: Gait belt;Oxygen Activity Tolerance: Patient limited by fatigue Patient left: with call bell/phone within reach;in bed;with family/visitor present (sitting on EOB) Nurse Communication: Mobility status         Time: 2542-7062 PT  Time Calculation (min) (ACUTE ONLY): 19 min   Charges:   PT Evaluation $Initial PT Evaluation Tier I: 1 Procedure     PT G CodesDenice Paradise 10-30-2015, 1:01 PM  [place] [facility] [facility] Acute Rehabilitation [phone] (pager)

## 2015-10-24 NOTE — Care Management Important Message (Signed)
Important Message  Patient Details  Name: [name] MRN: [id number] Date of Birth: [birth year]   Medicare Important Message Given:  Yes    Dafney Farler P Gertude Benito 10/24/2015, 1:31 PM

## 2015-10-24 NOTE — Progress Notes (Signed)
[facility] TEAM 1 - Stepdown/ICU TEAM  [name] UUV:[id number] DOB: May 25, 1946 DOA: 10/20/2015 PCP: [name], MD  Admit HPI / Brief Narrative: 69 year old M Hx Anxiety, Bladder Ca, Colonic Polyps (hyperplastic), COPD with continued Tobacco Abuse (1-2 PPD), CAD, HTN, and HLD who presented with coug, hypoxia, and shortness of breath for 1 month. Per patient, they have mold in the house and his mother-in-law who has been living in the house has been dusting which flared his symptoms. Patient reported about 15 pound weight gain over the last few months and swelling of his ankles. The patient was seen at his PCPs office and was noted to have hypoxia with O2 sats in the 70s. He was given Solu-Medrol, breathing treatments and O2 and transported to ED.  In the ED Chest x-ray showed bullous disease in both upper lobes with diminished pulmonary vascularity to the upper lobes, underlying interstitial fibrosis.  HPI/Subjective: The patient is not making much progress with attempts to wean his oxygen thus far.  He admits to feeling significantly short of breath and even somewhat dizzy when he attempts to exert himself.  He denies chest pain nausea vomiting fevers chills or abdominal pain.  Assessment/Plan:  Acute respiratory failure with hypoxia likely due to COPD exacerbation -Ongoing smoking but now understands that he can never smoke again  -Strep pneumo urine antigen / Legionella urine antigen both negative -wean O2 as able - was not yet on home O2 prior to admit, but this may be unavoidable at time of d/c - may require temporary home O2 support at d/c - failed attempt to obtain ambulatory O2 sat today as he still required 5+L just sitting still - cont in attempts to wean   Essential HTN -BP well controlled   HLD  -Simvastatin 20 mg daily -LDL 98 / HDL 25  Obesity - Body mass index is 35.14 kg/(m^2). -Patient counseled on diet and weight control  Nicotine abuse -Patient counseled  again strongly on smoking cessation - nicotine patch  Macrocytosis w/o anemia  -folate and B12 normal/high   Code Status: FULL Family Communication: no family present at time of exam today  Disposition Plan: SDU until O2 requirement less signif   Consultants: none  Procedures: 12/6 TTE - mild LVH - EF 55-60 % - no WMA - LA mod dilated - RV mod dilated - RA severely dilated - PA pressure 51 mmHg  Antibiotics: Azithromycin 12/5 > Ceftriaxone 12/5 >  DVT prophylaxis: lovenox   Objective: Blood pressure 127/78, pulse 96, temperature 98 F (36.7 C), temperature source Oral, resp. rate 23, height '5\' 6"'$  (1.676 m), weight 98.7 kg (217 lb 9.5 oz), SpO2 91 %.  Intake/Output Summary (Last 24 hours) at 10/24/15 1525 Last data filed at 10/24/15 1522  Gross per 24 hour  Intake    890 ml  Output   3575 ml  Net  -2685 ml   Exam: General: No acute respiratory distress sitting at bedside but requiring 4L to keep sat ~90% Lungs: Poor air movement throughout all fields, no wheeze, no focal crackles Cardiovascular: Heart sounds distant - Regular rate and rhythm without murmur gallop or rub Abdomen: Nontender, overweight, soft, bowel sounds positive, no rebound, no ascites, no appreciable mass Extremities: No significant cyanosis, or clubbing - trace edema bilateral lower extremities  Data Reviewed: Basic Metabolic Panel:  Recent Labs Lab 10/20/15 1157 10/21/15 0748 10/22/15 0309 10/23/15 0345  NA 138 135 138 138  K 5.0 4.9 5.2* 4.6  CL 102 100*  102 99*  CO2 '29 30 31 30  '$ GLUCOSE 122* 140* 135* 107*  BUN '12 17 20 17  '$ CREATININE 0.81 0.94 0.89 0.92  CALCIUM 9.1 8.6* 8.7* 8.6*  MG  --   --  2.2  --     CBC:  Recent Labs Lab 10/20/15 1157 10/21/15 0315 10/22/15 0309 10/23/15 0345  WBC 7.9 6.0 14.3* 13.1*  NEUTROABS  --   --  12.8*  --   HGB 15.3 14.2 13.9 14.3  HCT 49.1 44.7 45.6 47.5  MCV 103.8* 102.3* 104.1* 103.3*  PLT 149* 150 156 166    Liver Function  Tests:  Recent Labs Lab 10/22/15 0309 10/23/15 0345  AST 31 33  ALT 17 26  ALKPHOS 39 39  BILITOT 0.6 1.0  PROT 6.2* 6.5  ALBUMIN 3.2* 3.2*    Recent Results (from the past 240 hour(s))  MRSA PCR Screening     Status: None   Collection Time: 10/20/15  7:27 PM  Result Value Ref Range Status   MRSA by PCR NEGATIVE NEGATIVE Final    Comment:        The GeneXpert MRSA Assay (FDA approved for NASAL specimens only), is one component of a comprehensive MRSA colonization surveillance program. It is not intended to diagnose MRSA infection nor to guide or monitor treatment for MRSA infections.   Culture, blood (routine x 2) Call MD if unable to obtain prior to antibiotics being given     Status: None (Preliminary result)   Collection Time: 10/20/15  8:42 PM  Result Value Ref Range Status   Specimen Description BLOOD RIGHT ANTECUBITAL  Final   Special Requests BOTTLES DRAWN AEROBIC AND ANAEROBIC 5CC  Final   Culture NO GROWTH 4 DAYS  Final   Report Status PENDING  Incomplete  Culture, blood (routine x 2) Call MD if unable to obtain prior to antibiotics being given     Status: None (Preliminary result)   Collection Time: 10/20/15  8:50 PM  Result Value Ref Range Status   Specimen Description BLOOD RIGHT WRIST  Final   Special Requests BOTTLES DRAWN AEROBIC AND ANAEROBIC 10CC  Final   Culture NO GROWTH 4 DAYS  Final   Report Status PENDING  Incomplete  Urine culture     Status: None   Collection Time: 10/20/15 10:35 PM  Result Value Ref Range Status   Specimen Description URINE, CLEAN CATCH  Final   Special Requests NONE  Final   Culture MULTIPLE SPECIES PRESENT, SUGGEST RECOLLECTION  Final   Report Status 10/22/2015 FINAL  Final  Respiratory virus panel     Status: None   Collection Time: 10/21/15 12:57 PM  Result Value Ref Range Status   Source - RVPAN NASOPHARYNGEAL  Final   Respiratory Syncytial Virus A Negative Negative Final   Respiratory Syncytial Virus B Negative  Negative Final   Influenza A Negative Negative Final   Influenza B Negative Negative Final   Parainfluenza 1 Negative Negative Final   Parainfluenza 2 Negative Negative Final   Parainfluenza 3 Negative Negative Final   Metapneumovirus Negative Negative Final   Rhinovirus Negative Negative Final   Adenovirus Negative Negative Final    Comment: (NOTE) Performed At: [facility] [address] [place], [place] [id number] Lindon Romp MD ID:[id number]   Culture, blood (routine x 2)     Status: None (Preliminary result)   Collection Time: 10/21/15  2:10 PM  Result Value Ref Range Status   Specimen Description BLOOD LEFT ARM  Final   Special Requests BOTTLES DRAWN AEROBIC ONLY 3CCS  Final   Culture NO GROWTH 3 DAYS  Final   Report Status PENDING  Incomplete  Culture, blood (routine x 2)     Status: None (Preliminary result)   Collection Time: 10/21/15  2:15 PM  Result Value Ref Range Status   Specimen Description BLOOD LEFT HAND  Final   Special Requests IN PEDIATRIC BOTTLE 2.5CCS  Final   Culture NO GROWTH 3 DAYS  Final   Report Status PENDING  Incomplete  Culture, expectorated sputum-assessment     Status: None   Collection Time: 10/21/15  8:24 PM  Result Value Ref Range Status   Specimen Description SPUTUM  Final   Special Requests NONE  Final   Sputum evaluation   Final    MICROSCOPIC FINDINGS SUGGEST THAT THIS SPECIMEN IS NOT REPRESENTATIVE OF LOWER RESPIRATORY SECRETIONS. PLEASE RECOLLECT. CRITICAL RESULT CALLED TO, READ BACK BY AND VERIFIED WITH: CHRIS [id number] 10/21/15 mkelly    Report Status 10/21/2015 FINAL  Final     Studies:   Recent x-ray studies have been reviewed in detail by the Attending Physician  Scheduled Meds:  Scheduled Meds: . amLODipine  10 mg Oral Daily  . antiseptic oral rinse  7 mL Mouth Rinse BID  . aspirin EC  81 mg Oral Daily  . azithromycin  500 mg Oral Daily  . cefTRIAXone (ROCEPHIN)  IV  1 g Intravenous Q24H  . enoxaparin  (LOVENOX) injection  40 mg Subcutaneous Q24H  . folic acid  1 mg Oral Daily  . ipratropium-albuterol  3 mL Nebulization QID  . lisinopril  40 mg Oral Daily  . methylPREDNISolone (SOLU-MEDROL) injection  60 mg Intravenous Daily  . mometasone-formoterol  2 puff Inhalation BID  . multivitamin with minerals  1 tablet Oral Daily  . nicotine  21 mg Transdermal Daily  . omega-3 acid ethyl esters  1 g Oral Daily  . pantoprazole  40 mg Oral Q0600  . polyethylene glycol  17 g Oral Daily  . senna-docusate  1 tablet Oral BID  . simvastatin  20 mg Oral q1800  . thiamine  100 mg Oral Daily    Time spent on care of this patient: 35 mins   Peyten Punches T , MD   [facility] Office  [phone] Pager - Text Page per Shea Evans as per below:  On-Call/Text Page:      [email or web address]      password [email or web address]  If 7PM-7AM, please contact night-coverage [email or web address] Password [email or web address] 10/24/2015, 3:25 PM   LOS: 4 days

## 2015-10-24 NOTE — Progress Notes (Signed)
SATURATION QUALIFICATIONS: (This note is used to comply with regulatory documentation for home oxygen)  Patient Saturations on Room Air at Rest = 85-88%  Patient Saturations on Room Air while Ambulating = Unable to assess as pt was desating down to 85% on RA at rest%  Patient Saturations on 4 Liters of oxygen while Ambulating = 86-89%  Patient Saturations on 6 Liters of oxygen while Ambulating = 89-92%   Please briefly explain why patient needs home oxygen:Pt desats on RA at rest.  Pt desats with O2 with ambulation unless incr to 6Liters of O2 and also needed cues for pursed lip breathing to keep sats >90%.  Thanks. [facility] [phone] (pager)

## 2015-10-25 LAB — CULTURE, BLOOD (ROUTINE X 2)
CULTURE: NO GROWTH
Culture: NO GROWTH

## 2015-10-25 MED ORDER — LEVOFLOXACIN 750 MG PO TABS
750.0000 mg | ORAL_TABLET | ORAL | Status: AC
  Administered 2015-10-25 – 2015-10-27 (×3): 750 mg via ORAL
  Filled 2015-10-25 (×3): qty 1

## 2015-10-25 MED ORDER — FUROSEMIDE 10 MG/ML IJ SOLN
40.0000 mg | Freq: Once | INTRAMUSCULAR | Status: AC
  Administered 2015-10-25: 40 mg via INTRAVENOUS
  Filled 2015-10-25: qty 4

## 2015-10-25 NOTE — Progress Notes (Signed)
[name] [id number]:[id number] DOB: 07-28-1946 DOA: 10/20/2015 PCP: [name], MD  Admit HPI / Brief Narrative: 69 year old M Hx Anxiety, Bladder Ca, Colonic Polyps (hyperplastic), COPD with continued Tobacco Abuse (1-2 PPD), CAD, HTN, and HLD who presented with coug, hypoxia, and shortness of breath for 1 month. Per patient, they have mold in the house and his mother-in-law who has been living in the house has been dusting which flared his symptoms. Patient reported about 15 pound weight gain over the last few months and swelling of his ankles. The patient was seen at his PCPs office and was noted to have hypoxia with O2 sats in the 70s. He was given Solu-Medrol, breathing treatments and O2 and transported to ED.  In the ED Chest x-ray showed bullous disease in both upper lobes with diminished pulmonary vascularity to the upper lobes, underlying interstitial fibrosis.  HPI/Subjective: The patient is sitting up at the bedside.  He reports slow improvement in his shortness of breath.  He denies chest pain fevers chills nausea vomiting or abdominal pain.  Assessment/Plan:  Acute respiratory failure with hypoxia likely due to COPD exacerbation -Ongoing smoking but now understands that he can never smoke again  -Strep pneumo urine antigen / Legionella urine antigen both negative -wean O2 as able - was not yet on home O2 prior to admit, but this may be unavoidable at time of d/c - may require temporary home O2 support at d/c - cont in attempts to wean, with some improvement today from 5>3 L requirement   Essential HTN -BP well controlled   HLD  -Simvastatin 20 mg daily -LDL 98 / HDL 25  Obesity - Body mass index is 34.21 kg/(m^2). -Patient counseled on diet and weight control  Nicotine abuse -Patient counseled again strongly on smoking cessation - nicotine patch  Macrocytosis w/o anemia  -folate and B12 normal/high   Code Status: FULL Family  Communication: no family present at time of exam today  Disposition Plan: SDU until O2 requirement further improved   Consultants: none  Procedures: 12/6 TTE - mild LVH - EF 55-60 % - no WMA - LA mod dilated - RV mod dilated - RA severely dilated - PA pressure 51 mmHg  Antibiotics: Azithromycin 12/5 > 12/10 Ceftriaxone 12/5 > 12/10 Levaquin 12/10 >  DVT prophylaxis: lovenox   Objective: Blood pressure 135/82, pulse 99, temperature 98.5 F (36.9 C), temperature source Oral, resp. rate 18, height '5\' 6"'$  (1.676 m), weight 96.1 kg (211 lb 13.8 oz), SpO2 90 %.  Intake/Output Summary (Last 24 hours) at 10/25/15 1523 Last data filed at 10/25/15 1300  Gross per 24 hour  Intake    940 ml  Output   3030 ml  Net  -2090 ml   Exam: General: No acute respiratory distress sitting at bedside - requiring 3L to keep sat ~90% Lungs: Poor air movement throughout all fields, no wheeze Cardiovascular: Heart sounds distant - Regular rate and rhythm without murmur  Abdomen: Nontender, overweight, soft, bowel sounds positive, no rebound, no ascites, no appreciable mass Extremities: No significant cyanosis, or clubbing - trace edema bilateral lower extremities  Data Reviewed: Basic Metabolic Panel:  Recent Labs Lab 10/20/15 1157 10/21/15 0748 10/22/15 0309 10/23/15 0345  NA 138 135 138 138  K 5.0 4.9 5.2* 4.6  CL 102 100* 102 99*  CO2 '29 30 31 30  '$ GLUCOSE 122* 140* 135* 107*  BUN '12 17 20 17  '$ CREATININE 0.81 0.94 0.89  0.92  CALCIUM 9.1 8.6* 8.7* 8.6*  MG  --   --  2.2  --     CBC:  Recent Labs Lab 10/20/15 1157 10/21/15 0315 10/22/15 0309 10/23/15 0345  WBC 7.9 6.0 14.3* 13.1*  NEUTROABS  --   --  12.8*  --   HGB 15.3 14.2 13.9 14.3  HCT 49.1 44.7 45.6 47.5  MCV 103.8* 102.3* 104.1* 103.3*  PLT 149* 150 156 166    Liver Function Tests:  Recent Labs Lab 10/22/15 0309 10/23/15 0345  AST 31 33  ALT 17 26  ALKPHOS 39 39  BILITOT 0.6 1.0  PROT 6.2* 6.5  ALBUMIN 3.2*  3.2*    Recent Results (from the past 240 hour(s))  MRSA PCR Screening     Status: None   Collection Time: 10/20/15  7:27 PM  Result Value Ref Range Status   MRSA by PCR NEGATIVE NEGATIVE Final    Comment:        The GeneXpert MRSA Assay (FDA approved for NASAL specimens only), is one component of a comprehensive MRSA colonization surveillance program. It is not intended to diagnose MRSA infection nor to guide or monitor treatment for MRSA infections.   Culture, blood (routine x 2) Call MD if unable to obtain prior to antibiotics being given     Status: None   Collection Time: 10/20/15  8:42 PM  Result Value Ref Range Status   Specimen Description BLOOD RIGHT ANTECUBITAL  Final   Special Requests BOTTLES DRAWN AEROBIC AND ANAEROBIC 5CC  Final   Culture NO GROWTH 5 DAYS  Final   Report Status 10/25/2015 FINAL  Final  Culture, blood (routine x 2) Call MD if unable to obtain prior to antibiotics being given     Status: None   Collection Time: 10/20/15  8:50 PM  Result Value Ref Range Status   Specimen Description BLOOD RIGHT WRIST  Final   Special Requests BOTTLES DRAWN AEROBIC AND ANAEROBIC 10CC  Final   Culture NO GROWTH 5 DAYS  Final   Report Status 10/25/2015 FINAL  Final  Urine culture     Status: None   Collection Time: 10/20/15 10:35 PM  Result Value Ref Range Status   Specimen Description URINE, CLEAN CATCH  Final   Special Requests NONE  Final   Culture MULTIPLE SPECIES PRESENT, SUGGEST RECOLLECTION  Final   Report Status 10/22/2015 FINAL  Final  Respiratory virus panel     Status: None   Collection Time: 10/21/15 12:57 PM  Result Value Ref Range Status   Source - RVPAN NASOPHARYNGEAL  Final   Respiratory Syncytial Virus A Negative Negative Final   Respiratory Syncytial Virus B Negative Negative Final   Influenza A Negative Negative Final   Influenza B Negative Negative Final   Parainfluenza 1 Negative Negative Final   Parainfluenza 2 Negative Negative Final    Parainfluenza 3 Negative Negative Final   Metapneumovirus Negative Negative Final   Rhinovirus Negative Negative Final   Adenovirus Negative Negative Final    Comment: (NOTE) Performed At: [facility] [address] [place], [place] [id number] Lindon Romp MD [id number]   Culture, blood (routine x 2)     Status: None (Preliminary result)   Collection Time: 10/21/15  2:10 PM  Result Value Ref Range Status   Specimen Description BLOOD LEFT ARM  Final   Special Requests BOTTLES DRAWN AEROBIC ONLY 3CCS  Final   Culture NO GROWTH 4 DAYS  Final   Report Status PENDING  Incomplete  Culture, blood (routine x 2)     Status: None (Preliminary result)   Collection Time: 10/21/15  2:15 PM  Result Value Ref Range Status   Specimen Description BLOOD LEFT HAND  Final   Special Requests IN PEDIATRIC BOTTLE 2.5CCS  Final   Culture NO GROWTH 4 DAYS  Final   Report Status PENDING  Incomplete  Culture, expectorated sputum-assessment     Status: None   Collection Time: 10/21/15  8:24 PM  Result Value Ref Range Status   Specimen Description SPUTUM  Final   Special Requests NONE  Final   Sputum evaluation   Final    MICROSCOPIC FINDINGS SUGGEST THAT THIS SPECIMEN IS NOT REPRESENTATIVE OF LOWER RESPIRATORY SECRETIONS. PLEASE RECOLLECT. CRITICAL RESULT CALLED TO, READ BACK BY AND VERIFIED WITH: CHRIS [id number] 10/21/15 mkelly    Report Status 10/21/2015 FINAL  Final     Studies:   Recent x-ray studies have been reviewed in detail by the Attending Physician  Scheduled Meds:  Scheduled Meds: . amLODipine  10 mg Oral Daily  . antiseptic oral rinse  7 mL Mouth Rinse BID  . aspirin EC  81 mg Oral Daily  . azithromycin  500 mg Oral Daily  . cefTRIAXone (ROCEPHIN)  IV  1 g Intravenous Q24H  . enoxaparin (LOVENOX) injection  40 mg Subcutaneous Q24H  . folic acid  1 mg Oral Daily  . ipratropium-albuterol  3 mL Nebulization QID  . lisinopril  20 mg Oral Daily  . methylPREDNISolone  (SOLU-MEDROL) injection  60 mg Intravenous Daily  . mometasone-formoterol  2 puff Inhalation BID  . multivitamin with minerals  1 tablet Oral Daily  . nicotine  21 mg Transdermal Daily  . omega-3 acid ethyl esters  1 g Oral Daily  . pantoprazole  40 mg Oral Q0600  . polyethylene glycol  17 g Oral Daily  . senna-docusate  1 tablet Oral BID  . simvastatin  20 mg Oral q1800  . thiamine  100 mg Oral Daily    Time spent on care of this patient: 25 mins   [facility] T , MD   [facility] Office  [phone] Pager - Text Page per Shea Evans as per below:  On-Call/Text Page:      [email or web address]      password [email or web address]  If 7PM-7AM, please contact night-coverage [email or web address] Password [email or web address] 10/25/2015, 3:23 PM   LOS: 5 days

## 2015-10-26 LAB — BASIC METABOLIC PANEL
ANION GAP: 8 (ref 5–15)
BUN: 24 mg/dL — ABNORMAL HIGH (ref 6–20)
CALCIUM: 9.1 mg/dL (ref 8.9–10.3)
CHLORIDE: 94 mmol/L — AB (ref 101–111)
CO2: 31 mmol/L (ref 22–32)
Creatinine, Ser: 0.99 mg/dL (ref 0.61–1.24)
GFR calc non Af Amer: >60 mL/min (ref >60)
Glucose, Bld: 109 mg/dL — ABNORMAL HIGH (ref 65–99)
POTASSIUM: 5.2 mmol/L — AB (ref 3.5–5.1)
Sodium: 133 mmol/L — ABNORMAL LOW (ref 135–145)

## 2015-10-26 LAB — CULTURE, BLOOD (ROUTINE X 2)
CULTURE: NO GROWTH
CULTURE: NO GROWTH

## 2015-10-26 MED ORDER — FUROSEMIDE 10 MG/ML IJ SOLN
40.0000 mg | Freq: Once | INTRAMUSCULAR | Status: AC
  Administered 2015-10-26: 40 mg via INTRAVENOUS
  Filled 2015-10-26: qty 4

## 2015-10-26 NOTE — Progress Notes (Signed)
TEAM 1 - Stepdown/ICU TEAM  [name] JJH:[id number] DOB: [birth year] DOA: 10/20/2015 PCP: [name], MD  Admit HPI / Brief Narrative: 69 year old M Hx Anxiety, Bladder Ca, Colonic Polyps (hyperplastic), COPD with continued Tobacco Abuse (1-2 PPD), CAD, HTN, and HLD who presented with coug, hypoxia, and shortness of breath for 1 month. Per patient, they have mold in the house and his mother-in-law who has been living in the house has been dusting which flared his symptoms. Patient reported about 15 pound weight gain over the last few months and swelling of his ankles. The patient was seen at his PCPs office and was noted to have hypoxia with O2 sats in the 70s. He was given Solu-Medrol, breathing treatments and O2 and transported to ED.  In the ED Chest x-ray showed bullous disease in both upper lobes with diminished pulmonary vascularity to the upper lobes, underlying interstitial fibrosis.  HPI/Subjective: The patient is up ambulating in the hallway without on 2 L nasal cannula oxygen without difficulty.  He denies chest pain fevers chills nausea vomiting or abdominal pain.  He feels his shortness of breath has greatly improved.  He greatly desires to be liberated from oxygen prior to his discharge home.  Assessment/Plan:  Acute respiratory failure with hypoxia likely due to COPD exacerbation -Ongoing smoking but now understands that he can never smoke again  -Strep pneumo urine antigen / Legionella urine antigen both negative -wean O2 as able - was not yet on home O2 prior to admit, but this may be unavoidable at time of d/c - may require temporary home O2 support at d/c - cont in attempts to wean - now down to 2L [place] only - will give patient additional 48 hours to wean but if not successful in liberating from oxygen at time we'll discharge home with supplemental oxygen  Essential HTN -BP well controlled   HLD  -Simvastatin 20 mg daily -LDL 98 / HDL 25  Obesity - Body  mass index is 33.57 kg/(m^2). -Patient counseled on diet and weight control  Nicotine abuse -Patient counseled strongly on smoking cessation - nicotine patch  Macrocytosis w/o anemia  -folate and B12 normal/high   Code Status: FULL Family Communication: no family present at time of exam today  Disposition Plan: Transfer to telemetry bed with continuous oxygen monitoring - continue to wean oxygen as able   Consultants: none  Procedures: 12/6 TTE - mild LVH - EF 55-60 % - no WMA - LA mod dilated - RV mod dilated - RA severely dilated - PA pressure 51 mmHg  Antibiotics: Azithromycin 12/5 > 12/10 Ceftriaxone 12/5 > 12/10 Levaquin 12/10 >  DVT prophylaxis: lovenox   Objective: Blood pressure 120/70, pulse 102, temperature 99.1 F (37.3 C), temperature source Oral, resp. rate 22, height '5\' 6"'$  (1.676 m), weight 94.3 kg (207 lb 14.3 oz), SpO2 93 %.  Intake/Output Summary (Last 24 hours) at 10/26/15 1423 Last data filed at 10/26/15 0900  Gross per 24 hour  Intake    940 ml  Output   3100 ml  Net  -2160 ml   Exam: General: No acute respiratory distress sitting at bedside - requiring 2L to keep sat ~94% Lungs: Improved air movement throughout all fields with only mild wheezing diffusely Cardiovascular: Heart sounds distant - Regular rate and rhythm  Abdomen: Nontender, overweight, soft, bowel sounds positive, no rebound, no ascites, no appreciable mass Extremities: No significant cyanosis, or clubbing - no edema bilateral lower extremities  Data Reviewed: Basic  Metabolic Panel:  Recent Labs Lab 10/20/15 1157 10/21/15 0748 10/22/15 0309 10/23/15 0345 10/26/15 0355  NA 138 135 138 138 133*  K 5.0 4.9 5.2* 4.6 5.2*  CL 102 100* 102 99* 94*  CO2 '29 30 31 30 31  '$ GLUCOSE 122* 140* 135* 107* 109*  BUN '12 17 20 17 '$ 24*  CREATININE 0.81 0.94 0.89 0.92 0.99  CALCIUM 9.1 8.6* 8.7* 8.6* 9.1  MG  --   --  2.2  --   --     CBC:  Recent Labs Lab 10/20/15 1157 10/21/15 0315  10/22/15 0309 10/23/15 0345  WBC 7.9 6.0 14.3* 13.1*  NEUTROABS  --   --  12.8*  --   HGB 15.3 14.2 13.9 14.3  HCT 49.1 44.7 45.6 47.5  MCV 103.8* 102.3* 104.1* 103.3*  PLT 149* 150 156 166    Liver Function Tests:  Recent Labs Lab 10/22/15 0309 10/23/15 0345  AST 31 33  ALT 17 26  ALKPHOS 39 39  BILITOT 0.6 1.0  PROT 6.2* 6.5  ALBUMIN 3.2* 3.2*    Recent Results (from the past 240 hour(s))  MRSA PCR Screening     Status: None   Collection Time: 10/20/15  7:27 PM  Result Value Ref Range Status   MRSA by PCR NEGATIVE NEGATIVE Final    Comment:        The GeneXpert MRSA Assay (FDA approved for NASAL specimens only), is one component of a comprehensive MRSA colonization surveillance program. It is not intended to diagnose MRSA infection nor to guide or monitor treatment for MRSA infections.   Culture, blood (routine x 2) Call MD if unable to obtain prior to antibiotics being given     Status: None   Collection Time: 10/20/15  8:42 PM  Result Value Ref Range Status   Specimen Description BLOOD RIGHT ANTECUBITAL  Final   Special Requests BOTTLES DRAWN AEROBIC AND ANAEROBIC 5CC  Final   Culture NO GROWTH 5 DAYS  Final   Report Status 10/25/2015 FINAL  Final  Culture, blood (routine x 2) Call MD if unable to obtain prior to antibiotics being given     Status: None   Collection Time: 10/20/15  8:50 PM  Result Value Ref Range Status   Specimen Description BLOOD RIGHT WRIST  Final   Special Requests BOTTLES DRAWN AEROBIC AND ANAEROBIC 10CC  Final   Culture NO GROWTH 5 DAYS  Final   Report Status 10/25/2015 FINAL  Final  Urine culture     Status: None   Collection Time: 10/20/15 10:35 PM  Result Value Ref Range Status   Specimen Description URINE, CLEAN CATCH  Final   Special Requests NONE  Final   Culture MULTIPLE SPECIES PRESENT, SUGGEST RECOLLECTION  Final   Report Status 10/22/2015 FINAL  Final  Respiratory virus panel     Status: None   Collection Time:  10/21/15 12:57 PM  Result Value Ref Range Status   Source - RVPAN NASOPHARYNGEAL  Final   Respiratory Syncytial Virus A Negative Negative Final   Respiratory Syncytial Virus B Negative Negative Final   Influenza A Negative Negative Final   Influenza B Negative Negative Final   Parainfluenza 1 Negative Negative Final   Parainfluenza 2 Negative Negative Final   Parainfluenza 3 Negative Negative Final   Metapneumovirus Negative Negative Final   Rhinovirus Negative Negative Final   Adenovirus Negative Negative Final    Comment: (NOTE) Performed At: [facility] [address] [place], [place] [id number]  Lindon Romp MD SW:[id number]   Culture, blood (routine x 2)     Status: None   Collection Time: 10/21/15  2:10 PM  Result Value Ref Range Status   Specimen Description BLOOD LEFT ARM  Final   Special Requests BOTTLES DRAWN AEROBIC ONLY 3CCS  Final   Culture NO GROWTH 5 DAYS  Final   Report Status 10/26/2015 FINAL  Final  Culture, blood (routine x 2)     Status: None   Collection Time: 10/21/15  2:15 PM  Result Value Ref Range Status   Specimen Description BLOOD LEFT HAND  Final   Special Requests IN PEDIATRIC BOTTLE 2.5CCS  Final   Culture NO GROWTH 5 DAYS  Final   Report Status 10/26/2015 FINAL  Final  Culture, expectorated sputum-assessment     Status: None   Collection Time: 10/21/15  8:24 PM  Result Value Ref Range Status   Specimen Description SPUTUM  Final   Special Requests NONE  Final   Sputum evaluation   Final    MICROSCOPIC FINDINGS SUGGEST THAT THIS SPECIMEN IS NOT REPRESENTATIVE OF LOWER RESPIRATORY SECRETIONS. PLEASE RECOLLECT. CRITICAL RESULT CALLED TO, READ BACK BY AND VERIFIED WITH: CHRIS [id number] 10/21/15 mkelly    Report Status 10/21/2015 FINAL  Final     Studies:   Recent x-ray studies have been reviewed in detail by the Attending Physician  Scheduled Meds:  Scheduled Meds: . amLODipine  10 mg Oral Daily  . antiseptic oral rinse  7 mL Mouth  Rinse BID  . aspirin EC  81 mg Oral Daily  . enoxaparin (LOVENOX) injection  40 mg Subcutaneous Q24H  . folic acid  1 mg Oral Daily  . ipratropium-albuterol  3 mL Nebulization QID  . levofloxacin  750 mg Oral Q24H  . lisinopril  20 mg Oral Daily  . methylPREDNISolone (SOLU-MEDROL) injection  60 mg Intravenous Daily  . mometasone-formoterol  2 puff Inhalation BID  . multivitamin with minerals  1 tablet Oral Daily  . nicotine  21 mg Transdermal Daily  . omega-3 acid ethyl esters  1 g Oral Daily  . pantoprazole  40 mg Oral Q0600  . polyethylene glycol  17 g Oral Daily  . senna-docusate  1 tablet Oral BID  . simvastatin  20 mg Oral q1800  . thiamine  100 mg Oral Daily    Time spent on care of this patient: 25 mins   [facility] T , MD   [facility] Office  [phone] Pager - Text Page per Shea Evans as per below:  On-Call/Text Page:      [email or web address]      password [email or web address]  If 7PM-7AM, please contact night-coverage [email or web address] Password [email or web address] 10/26/2015, 2:23 PM   LOS: 6 days

## 2015-10-27 LAB — BASIC METABOLIC PANEL
Anion gap: 10 (ref 5–15)
BUN: 23 mg/dL — AB (ref 6–20)
CALCIUM: 9.1 mg/dL (ref 8.9–10.3)
CHLORIDE: 94 mmol/L — AB (ref 101–111)
CO2: 31 mmol/L (ref 22–32)
CREATININE: 1.1 mg/dL (ref 0.61–1.24)
GFR calc non Af Amer: >60 mL/min (ref >60)
GLUCOSE: 121 mg/dL — AB (ref 65–99)
Potassium: 4.5 mmol/L (ref 3.5–5.1)
Sodium: 135 mmol/L (ref 135–145)

## 2015-10-27 MED ORDER — FUROSEMIDE 10 MG/ML IJ SOLN
40.0000 mg | Freq: Once | INTRAMUSCULAR | Status: AC
  Administered 2015-10-27: 40 mg via INTRAVENOUS
  Filled 2015-10-27: qty 4

## 2015-10-27 MED ORDER — LISINOPRIL 10 MG PO TABS
10.0000 mg | ORAL_TABLET | Freq: Every day | ORAL | Status: DC
  Administered 2015-10-28 – 2015-10-29 (×2): 10 mg via ORAL
  Filled 2015-10-27 (×2): qty 1

## 2015-10-27 MED ORDER — SALINE SPRAY 0.65 % NA SOLN
1.0000 | NASAL | Status: DC | PRN
  Filled 2015-10-27: qty 44

## 2015-10-27 NOTE — Progress Notes (Signed)
Pt transferred to 3 [address] bed 20 per w/c with all belongings. Wife aware that pt is transferring.

## 2015-10-27 NOTE — Progress Notes (Signed)
Report called to [address] [place] to [employer]. RN aware that pt will be coming in a wheelchair.

## 2015-10-27 NOTE — Progress Notes (Signed)
Occupational Therapy Treatment Patient Details Name: [name] MRN: [id number] DOB: 06/19/1946 Today's Date: 10/27/2015    History of present illness 69 year old M Hx Anxiety, Bladder Ca, Colonic Polyps (hyperplastic), COPD with continued Tobacco Abuse (1-2 PPD), CAD, HTN, and HLD who presented with coug, hypoxia, and shortness of breath for 1 month.  hypoxia with O2 sats in the 70s. He was given Solu-Medrol, breathing treatments and O2 and transported to ED. In the ED Chest x-ray showed bullous disease in both upper lobes with diminished pulmonary vascularity to the upper lobes, underlying interstitial fibrosis.   OT comments  Pt requires 5L supplemental 02 to maintain 02 sats >88% during activity.   Reviewed energy conservation techniques, needs reinforcement.   Follow Up Recommendations  Home health OT    Equipment Recommendations  3 in 1 bedside comode    Recommendations for Other Services      Precautions / Restrictions Precautions Precautions: Fall       Mobility Bed Mobility                  Transfers Overall transfer level: Needs assistance   Transfers: Sit to/from Stand;Stand Pivot Transfers Sit to Stand: Supervision Stand pivot transfers: Supervision            Balance           Standing balance support: No upper extremity supported Standing balance-Leahy Scale: Fair                     ADL Overall ADL's : Needs assistance/impaired                                       General ADL Comments: Pt requires supervision with ADLs.  Discussed recommendation to sit for showering, use of LH brush for LB ADLs. Pacinig, rest breaks,  Discussed the need to maintain 02 sats >88% and to rest and breathe if 02 drops.  Also discussed risk of falls increases when his 02 drops during activity,   Sats 89% on 1L at rest.  Sats dropped to 84% on 3L with activity and increased to 89% on 5L.   Pt needs reinforcement for pursed lip  breathing.  Reinforced use of IS and flutter valve       Vision                     Perception     Praxis      Cognition   Behavior During Therapy: WFL for tasks assessed/performed Overall Cognitive Status: Within Functional Limits for tasks assessed                       Extremity/Trunk Assessment               Exercises     Shoulder Instructions       General Comments      Pertinent Vitals/ Pain       Pain Assessment: No/denies pain  Home Living                                          Prior Functioning/Environment              Frequency Min 2X/week     Progress Toward Goals  OT Goals(current goals can now be found in the care plan section)  Progress towards OT goals: Progressing toward goals  Acute Rehab OT Goals Patient Stated Goal: to get better OT Goal Formulation: With patient Time For Goal Achievement: 11/07/15 Potential to Achieve Goals: Good ADL Goals Additional ADL Goal #1: Pt verbalize understadnign of 3 energy conservation techniques for ADL Additional ADL Goal #2: maintain O2 greater than 90 during ADL task  Plan Discharge plan remains appropriate    Co-evaluation                 End of Session Equipment Utilized During Treatment: Oxygen   Activity Tolerance Patient limited by fatigue   Patient Left in bed;with call bell/phone within reach;with family/visitor present (pt sitting EOB )   Nurse Communication Mobility status        Time: 6599-3570 OT Time Calculation (min): 30 min  Charges: OT General Charges $OT Visit: 1 Procedure OT Treatments $Self Care/Home Management : 8-22 mins $Therapeutic Activity: 8-22 mins  Angelamarie Avakian M 10/27/2015, 12:18 PM

## 2015-10-27 NOTE — Progress Notes (Signed)
Transfer report received at 1222 and pt arrived to the unit via wheelchair with spouse and belongings to the side at 1300; VSS; telemetry applied and verified; pt oriented to the unit and room; call light within reach along with room telephone. Pt educated on fall precautions and preventions which he voices understanding; Pt remains on oxygen; IV intact. Pt sitting up in bed within reach and spouse at bedside. Will closely monitor. Delia Heady RN

## 2015-10-27 NOTE — Progress Notes (Signed)
SATURATION QUALIFICATIONS: (This note is used to comply with regulatory documentation for home oxygen)  Patient Saturations on Room Air at Rest = 84-89%  Patient Saturations on Room Air while Ambulating = Unable due to low sats on RA at rest  Patient Saturations on 6 Liters of oxygen while Ambulating = 89-93%  Please briefly explain why patient needs home oxygen:Requires O2 for ambulation and at rest to keep sats >88%.  Thanks.  [place] [phone] (pager)

## 2015-10-27 NOTE — Progress Notes (Signed)
Physical Therapy Treatment Patient Details Name: [name] MRN: [id number] DOB: 1945-12-01 Today's Date: 10/27/2015    History of Present Illness 69 year old M Hx Anxiety, Bladder Ca, Colonic Polyps (hyperplastic), COPD with continued Tobacco Abuse (1-2 PPD), CAD, HTN, and HLD who presented with coug, hypoxia, and shortness of breath for 1 month.  hypoxia with O2 sats in the 70s. He was given Solu-Medrol, breathing treatments and O2 and transported to ED. In the ED Chest x-ray showed bullous disease in both upper lobes with diminished pulmonary vascularity to the upper lobes, underlying interstitial fibrosis.    PT Comments    Pt admitted with above diagnosis. Pt currently with functional limitations due to balance and endurance deficits. Pt on 1LO2 on arrival with sats 90-93%.  Pt progressing with ambulation and needs supervision with RW.  Pt still has issues with sats and needed 6LO2 to keep sats >89% during walk.  Pt also needed cues for pursed lip breathing.  Pt states that doctor told him "it can drop to 83% as long as it rebounds quickly."  MD: Please advise as to parameters for O2 sats.  Pt will benefit from skilled PT to increase their independence and safety with mobility to allow discharge to the venue listed below.    Follow Up Recommendations  Outpatient PT;Supervision/Assistance - 24 hour     Equipment Recommendations  Rolling walker with 5" wheels (home O2)    Recommendations for Other Services       Precautions / Restrictions Precautions Precautions: Fall Restrictions Weight Bearing Restrictions: No    Mobility  Bed Mobility               General bed mobility comments: pt sitting EOB  Transfers Overall transfer level: Needs assistance Equipment used: Rolling walker (2 wheeled) Transfers: Sit to/from Stand Sit to Stand: Supervision            Ambulation/Gait Ambulation/Gait assistance: Supervision Ambulation Distance (Feet): 300  Feet Assistive device: Rolling walker (2 wheeled) Gait Pattern/deviations: Step-through pattern;Decreased stride length;Wide base of support   Gait velocity interpretation: Below normal speed for age/gender General Gait Details: Pt was able to ambulate with RW and was generally steady.  Still likes to use the RW.  Pt did not desat below 89% on 6LO2 with ambulation with pursed lip breathing.     Stairs            Wheelchair Mobility    Modified Rankin (Stroke Patients Only)       Balance Overall balance assessment: Needs assistance;History of Falls Sitting-balance support: No upper extremity supported;Feet supported Sitting balance-Leahy Scale: Good     Standing balance support: During functional activity;Bilateral upper extremity supported Standing balance-Leahy Scale: Poor Standing balance comment: Pt requires bil UE support.                     Cognition Arousal/Alertness: Awake/alert Behavior During Therapy: WFL for tasks assessed/performed Overall Cognitive Status: Within Functional Limits for tasks assessed                      Exercises General Exercises - Upper Extremity Shoulder Flexion: AROM;Both;10 reps;Seated General Exercises - Lower Extremity Ankle Circles/Pumps: AROM;Both;10 reps;Seated Long Arc Quad: AROM;Both;10 reps;Seated    General Comments        Pertinent Vitals/Pain Pain Assessment: No/denies pain  Sats 87-91% with ambulation with 6LO2.  On 1LO2 at rest with sats 88-92%.      Home Living  Prior Function            PT Goals (current goals can now be found in the care plan section) Progress towards PT goals: Progressing toward goals    Frequency  Min 3X/week    PT Plan Current plan remains appropriate    Co-evaluation             End of Session Equipment Utilized During Treatment: Gait belt;Oxygen Activity Tolerance: Patient limited by fatigue Patient left: in bed;with call  bell/phone within reach;with family/visitor present     Time: 1610-9604 PT Time Calculation (min) (ACUTE ONLY): 15 min  Charges:  $Gait Training: 8-22 mins                    G CodesDenice Paradise 11-15-2015, 4:19 PM Ada Jrue Jarriel,PT Acute Rehabilitation [phone] (pager)

## 2015-10-27 NOTE — Progress Notes (Signed)
[place] TEAM 1 - Stepdown/ICU TEAM  [name] UPJ:[id number] DOB: 03-23-1946 DOA: 10/20/2015 PCP: [name], MD  Admit HPI / Brief Narrative: 69 year old M Hx Anxiety, Bladder Ca, Colonic Polyps (hyperplastic), COPD with continued Tobacco Abuse (1-2 PPD), CAD, HTN, and HLD who presented with coug, hypoxia, and shortness of breath for 1 month. Per patient, they have mold in the house and his mother-in-law who has been living in the house has been dusting which flared his symptoms. Patient reported about 15 pound weight gain over the last few months and swelling of his ankles. The patient was seen at his PCPs office and was noted to have hypoxia with O2 sats in the 70s. He was given Solu-Medrol, breathing treatments and O2 and transported to ED.  In the ED Chest x-ray showed bullous disease in both upper lobes with diminished pulmonary vascularity to the upper lobes, underlying interstitial fibrosis.  HPI/Subjective: The patient continues to slowly improve.  During my visit today I have weaned him to 1 L nasal cannula and while sitting still he is tolerating this well.  He denies chest pain fevers chills nausea or vomiting.  He feels that his breathing continues to improve.  Assessment/Plan:  Acute respiratory failure with hypoxia likely due to COPD exacerbation -Ongoing smoking but now understands that he can never smoke again  -Strep pneumo urine antigen / Legionella urine antigen both negative -wean O2 as able - was not yet on home O2 prior to admit, but this may be unavoidable at time of d/c - may require temporary home O2 support at d/c - cont in attempts to wean - now down to 1L [place] w/ rest - have agreed to continue to attempt wean until Wed 12/14 - if not weaned to RA by Wed, pt understands he will need to be d/c home on O2 support   Essential HTN -BP well controlled   HLD  -Simvastatin 20 mg daily -LDL 98 / HDL 25  Obesity - Body mass index is 33.29 kg/(m^2). -Patient  counseled on diet and weight control  Nicotine abuse -Patient counseled strongly on smoking cessation - nicotine patch  Macrocytosis w/o anemia  -folate and B12 normal/high   Code Status: FULL Family Communication: Spoke with patient and wife at bedside Disposition Plan: Transfer to telemetry bed with continuous oxygen monitoring - continue to wean oxygen as able - d/c home 12/14  Consultants: none  Procedures: 12/6 TTE - mild LVH - EF 55-60 % - no WMA - LA mod dilated - RV mod dilated - RA severely dilated - PA pressure 51 mmHg  Antibiotics: Azithromycin 12/5 > 12/10 Ceftriaxone 12/5 > 12/10 Levaquin 12/10 >  DVT prophylaxis: lovenox   Objective: Blood pressure 123/87, pulse 92, temperature 98.8 F (37.1 C), temperature source Oral, resp. rate 18, height '5\' 6"'$  (1.676 m), weight 93.5 kg (206 lb 2.1 oz), SpO2 92 %.  Intake/Output Summary (Last 24 hours) at 10/27/15 1020 Last data filed at 10/26/15 1700  Gross per 24 hour  Intake    720 ml  Output    800 ml  Net    -80 ml   Exam: General: No acute respiratory distress sitting on side of bed on 1L [place] Lungs: no wheezing on exam today - no focal crackles  Cardiovascular: Regular rate and rhythm w/o M Abdomen: Nontender, overweight, soft, bowel sounds positive, no rebound, no ascites, no appreciable mass Extremities: No significant cyanosis, clubbing, or edema bilateral lower extremities  Data Reviewed: Basic Metabolic  Panel:  Recent Labs Lab 10/21/15 0748 10/22/15 0309 10/23/15 0345 10/26/15 0355 10/27/15 0301  NA 135 138 138 133* 135  K 4.9 5.2* 4.6 5.2* 4.5  CL 100* 102 99* 94* 94*  CO2 '30 31 30 31 31  '$ GLUCOSE 140* 135* 107* 109* 121*  BUN '17 20 17 '$ 24* 23*  CREATININE 0.94 0.89 0.92 0.99 1.10  CALCIUM 8.6* 8.7* 8.6* 9.1 9.1  MG  --  2.2  --   --   --     CBC:  Recent Labs Lab 10/20/15 1157 10/21/15 0315 10/22/15 0309 10/23/15 0345  WBC 7.9 6.0 14.3* 13.1*  NEUTROABS  --   --  12.8*  --   HGB  15.3 14.2 13.9 14.3  HCT 49.1 44.7 45.6 47.5  MCV 103.8* 102.3* 104.1* 103.3*  PLT 149* 150 156 166    Liver Function Tests:  Recent Labs Lab 10/22/15 0309 10/23/15 0345  AST 31 33  ALT 17 26  ALKPHOS 39 39  BILITOT 0.6 1.0  PROT 6.2* 6.5  ALBUMIN 3.2* 3.2*    Recent Results (from the past 240 hour(s))  MRSA PCR Screening     Status: None   Collection Time: 10/20/15  7:27 PM  Result Value Ref Range Status   MRSA by PCR NEGATIVE NEGATIVE Final    Comment:        The GeneXpert MRSA Assay (FDA approved for NASAL specimens only), is one component of a comprehensive MRSA colonization surveillance program. It is not intended to diagnose MRSA infection nor to guide or monitor treatment for MRSA infections.   Culture, blood (routine x 2) Call MD if unable to obtain prior to antibiotics being given     Status: None   Collection Time: 10/20/15  8:42 PM  Result Value Ref Range Status   Specimen Description BLOOD RIGHT ANTECUBITAL  Final   Special Requests BOTTLES DRAWN AEROBIC AND ANAEROBIC 5CC  Final   Culture NO GROWTH 5 DAYS  Final   Report Status 10/25/2015 FINAL  Final  Culture, blood (routine x 2) Call MD if unable to obtain prior to antibiotics being given     Status: None   Collection Time: 10/20/15  8:50 PM  Result Value Ref Range Status   Specimen Description BLOOD RIGHT WRIST  Final   Special Requests BOTTLES DRAWN AEROBIC AND ANAEROBIC 10CC  Final   Culture NO GROWTH 5 DAYS  Final   Report Status 10/25/2015 FINAL  Final  Urine culture     Status: None   Collection Time: 10/20/15 10:35 PM  Result Value Ref Range Status   Specimen Description URINE, CLEAN CATCH  Final   Special Requests NONE  Final   Culture MULTIPLE SPECIES PRESENT, SUGGEST RECOLLECTION  Final   Report Status 10/22/2015 FINAL  Final  Respiratory virus panel     Status: None   Collection Time: 10/21/15 12:57 PM  Result Value Ref Range Status   Source - RVPAN NASOPHARYNGEAL  Final    Respiratory Syncytial Virus A Negative Negative Final   Respiratory Syncytial Virus B Negative Negative Final   Influenza A Negative Negative Final   Influenza B Negative Negative Final   Parainfluenza 1 Negative Negative Final   Parainfluenza 2 Negative Negative Final   Parainfluenza 3 Negative Negative Final   Metapneumovirus Negative Negative Final   Rhinovirus Negative Negative Final   Adenovirus Negative Negative Final    Comment: (NOTE) Performed At: [facility] [address] [place], [place] [id number] Evette Doffing  Darrick Penna MD TG:[id number]   Culture, blood (routine x 2)     Status: None   Collection Time: 10/21/15  2:10 PM  Result Value Ref Range Status   Specimen Description BLOOD LEFT ARM  Final   Special Requests BOTTLES DRAWN AEROBIC ONLY 3CCS  Final   Culture NO GROWTH 5 DAYS  Final   Report Status 10/26/2015 FINAL  Final  Culture, blood (routine x 2)     Status: None   Collection Time: 10/21/15  2:15 PM  Result Value Ref Range Status   Specimen Description BLOOD LEFT HAND  Final   Special Requests IN PEDIATRIC BOTTLE 2.5CCS  Final   Culture NO GROWTH 5 DAYS  Final   Report Status 10/26/2015 FINAL  Final  Culture, expectorated sputum-assessment     Status: None   Collection Time: 10/21/15  8:24 PM  Result Value Ref Range Status   Specimen Description SPUTUM  Final   Special Requests NONE  Final   Sputum evaluation   Final    MICROSCOPIC FINDINGS SUGGEST THAT THIS SPECIMEN IS NOT REPRESENTATIVE OF LOWER RESPIRATORY SECRETIONS. PLEASE RECOLLECT. CRITICAL RESULT CALLED TO, READ BACK BY AND VERIFIED WITH: CHRIS [id number] 10/21/15 mkelly    Report Status 10/21/2015 FINAL  Final     Studies:   Recent x-ray studies have been reviewed in detail by the Attending Physician  Scheduled Meds:  Scheduled Meds: . amLODipine  10 mg Oral Daily  . antiseptic oral rinse  7 mL Mouth Rinse BID  . aspirin EC  81 mg Oral Daily  . enoxaparin (LOVENOX) injection  40 mg  Subcutaneous Q24H  . folic acid  1 mg Oral Daily  . ipratropium-albuterol  3 mL Nebulization QID  . levofloxacin  750 mg Oral Q24H  . lisinopril  20 mg Oral Daily  . methylPREDNISolone (SOLU-MEDROL) injection  60 mg Intravenous Daily  . mometasone-formoterol  2 puff Inhalation BID  . multivitamin with minerals  1 tablet Oral Daily  . nicotine  21 mg Transdermal Daily  . omega-3 acid ethyl esters  1 g Oral Daily  . pantoprazole  40 mg Oral Q0600  . polyethylene glycol  17 g Oral Daily  . senna-docusate  1 tablet Oral BID  . simvastatin  20 mg Oral q1800  . thiamine  100 mg Oral Daily    Time spent on care of this patient: 25 mins   [facility] T , MD   [facility] Office  [phone] Pager - Text Page per Shea Evans as per below:  On-Call/Text Page:      [email or web address]      password [email or web address]  If 7PM-7AM, please contact night-coverage [email or web address] Password [email or web address] 10/27/2015, 10:20 AM   LOS: 7 days

## 2015-10-28 ENCOUNTER — Other Ambulatory Visit: Payer: Self-pay

## 2015-10-28 DIAGNOSIS — I272 Other secondary pulmonary hypertension: Secondary | ICD-10-CM

## 2015-10-28 DIAGNOSIS — I420 Dilated cardiomyopathy: Secondary | ICD-10-CM

## 2015-10-28 MED ORDER — IPRATROPIUM-ALBUTEROL 0.5-2.5 (3) MG/3ML IN SOLN
3.0000 mL | Freq: Three times a day (TID) | RESPIRATORY_TRACT | Status: DC
  Administered 2015-10-28 – 2015-10-29 (×3): 3 mL via RESPIRATORY_TRACT
  Filled 2015-10-28 (×3): qty 3

## 2015-10-28 MED ORDER — METHYLPREDNISOLONE SODIUM SUCC 40 MG IJ SOLR
40.0000 mg | Freq: Every day | INTRAMUSCULAR | Status: DC
  Administered 2015-10-29: 40 mg via INTRAVENOUS
  Filled 2015-10-28: qty 1

## 2015-10-28 MED ORDER — SIMVASTATIN 40 MG PO TABS: 40.0000 mg | ORAL_TABLET | Freq: Every day | ORAL | Status: DC

## 2015-10-28 NOTE — Consult Note (Signed)
   [facility] Inpatient Consult   10/28/2015  [name] May 23, 1946 [id number] Patient evaluated for community based chronic disease management services with [place] [facility] as a benefit of patient's Medicare Insurance. Patient was admitted for Acute Respiratory Failure.  Met with patient and his wife [name], bedside to explain [place] Management services. Consent form signed for [place] Management services.  Patient endorses that Dr. Cyndie Chime is his primary care provider and Dr. Teressa Lower as his pulmonologist.  Patient will receive post hospital discharge call and will be evaluated for monthly home visits for assessments and disease process education.  Left contact information and [facility] literature at bedside. Will make Inpatient Case Manager aware that [place] Management following. Of note, [facility] Care Management services does not replace or interfere with any services that are arranged by inpatient case management or social work.  For additional questions or referrals please contact:   Natividad Brood, RN BSN [facility] Liaison  [phone] business mobile phone

## 2015-10-28 NOTE — Progress Notes (Signed)
Nutrition Brief Note  Patient identified on the Malnutrition Screening Tool (MST) Report. Score filed inaccurately.   Wt Readings from Last 15 Encounters:  10/28/15 203 lb 8 oz (92.307 kg)  06/05/15 204 lb 3.2 oz (92.625 kg)  06/04/14 215 lb 9.6 oz (97.796 kg)  03/06/14 214 lb (97.07 kg)  12/05/13 229 lb 3.2 oz (103.964 kg)  07/11/13 228 lb (103.42 kg)  07/06/13 228 lb 9.6 oz (103.692 kg)  06/05/13 230 lb (104.327 kg)  01/25/13 221 lb 12.8 oz (100.608 kg)  10/19/12 219 lb 3.2 oz (99.428 kg)  04/18/12 211 lb (95.709 kg)  01/11/12 207 lb 1.9 oz (93.949 kg)  01/05/12 205 lb (92.987 kg)  10/19/11 213 lb 12.8 oz (96.979 kg)  03/24/11 213 lb 9.6 oz (96.888 kg)    Body mass index is 32.86 kg/(m^2). Patient meets criteria for Obesity based on current BMI. Weight is stable from 5 months ago. He denies having a decreased appetite.   Current diet order is Heart Healthy, patient is consuming approximately 100% of meals at this time. Labs and medications reviewed.   No nutrition interventions warranted at this time. If nutrition issues arise, please consult RD.   Scarlette Ar RD, LDN Inpatient Clinical Dietitian Pager: [phone] After Hours Pager: [phone]

## 2015-10-28 NOTE — Progress Notes (Signed)
CSW found and provided application for disability placard for pt- explained process to pt  No further CSW needs at this time  Domenica Reamer, [place] Social Worker [phone]

## 2015-10-28 NOTE — Care Management Note (Signed)
Case Management Note  Patient Details  Name: [name] MRN: [id number] Date of Birth: Oct 13, 1946  Subjective/Objective:       Admitted with  Acute Resp Failure             Action/Plan: Lives at home with spouse. PCP is [name], MD; Private insurance with Medicare and [employer]; pharmacy of choice is [facility] and he use a mail order pharmacy. Patient is agreeable to Outpatient PT/OT [facility]- referral made as requested. DME ordered -home 02, 3:1 and rolling walker.  Expected Discharge Date:      Possibly 10/29/2015           Expected Discharge Plan:  Home/Self Care  Discharge planning Services  CM Consult Choice offered to:  Patient  DME Arranged:  Walker rolling, 3-N-1, Oxygen DME Agency:  [place] Arranged:  NA    Status of Service:  In process, will continue to follow  Medicare Important Message Given:  Yes  Royston Bake RN<MHA,BSN [phone] 10/28/2015, 3:27 PM

## 2015-10-28 NOTE — Care Management Important Message (Signed)
Important Message  Patient Details  Name: [name] MRN: [id number] Date of Birth: Nov 25, 1945   Medicare Important Message Given:  Yes    Barb Merino Tiffeny Minchew 10/28/2015, 9:58 AM

## 2015-10-28 NOTE — Progress Notes (Signed)
SATURATION QUALIFICATIONS: (This note is used to comply with regulatory documentation for home oxygen)  Patient Saturations on Room Air at Rest = 91%  Patient Saturations on Room Air while Ambulating = 84%  Patient Saturations on 2 Liters of oxygen while Ambulating = 90%  Please briefly explain why patient needs home oxygen: 

## 2015-10-28 NOTE — Progress Notes (Signed)
[place] TEAM 1 - Stepdown/ICU TEAM Progress Note  [name]:[id number] DOB: 02/14/1946 DOA: 10/20/2015 PCP: [name], MD  Admit HPI / Brief Narrative: 69 year old WM PMHx Anxiety, Bladder Ca, Hx Colonic Polyps (hyperplastic), COPD with continued Tobacco Abuse (1-2 PPD), CAD, HTN, HLD,   Presented with coughing, hypoxia and shortness of breath, going on for last 1 month. Patient reported that his symptoms started one month ago. Per patient, they have mold in the house and his mother-in-law who has been living in the house has been dusting which flared his symptoms. He also noted subjective fevers, productive cough with yellowish and greenish sputum. Patient reported about 15 pound weight gain over the last few months and swelling of his ankles. The patient was seen at his PCPs office today and was noted to have hypoxia with O2 sats in the 70s. He was given Solu-Medrol, breathing treatments and O2 and patient was transported to ED. At the time of my encounter, patient still has significant wheezing and O2 sats in 80s on 4 L O2 via nasal cannula ED workup showed unremarkable CBC, BMET, MCV 103.8, BNP elevated at 498.4, troponin 0.01. Chest x-ray showed bullous disease in both upper lobes with diminished pulmonary vascularity to the upper lobes, underlying interstitial fibrosis.  HPI/Subjective: 12/13 A/O 4, patient able to walk to the length of the hallway and back without O2. Currently sitting on edge of bed eating his dinner comfortably.  States he understands he must stop smoking.  Assessment/Plan: Acute respiratory failure (HCC) with hypoxia likely due to COPD exacerbation -Ongoing nicotine abuse, clinical pneumonia, possible underlying CHF - Trace Solu-Medrol 40 mg daily -Continue Dulera -DuoNeb TID -Flutter valve - Completed course of antibiotics - blood cultures/sputum negative -Strep pneumo urine antigen/Legionella urine antigen negative - cont in attempts to wean  - now down to 1L [place] w/ rest - have agreed to continue to attempt wean until Wed 12/14 - if not weaned to RA by Wed, pt understands he will need to be d/c home on O2 support -Counseled patient most likely will need home O2;  SATURATION QUALIFICATIONS: (This note is used to comply with regulatory documentation for home oxygen) Patient Saturations on Room Air at Rest = 84-89% Patient Saturations on Room Air while Ambulating = Unable due to low sats on RA at rest Patient Saturations on 6 Liters of oxygen while Ambulating = 89-93% Please briefly explain why patient needs home oxygen:Requires O2 for ambulation and at rest to keep sats >88%. Thanks.   Dilated cardiomyopathy -See central hypertension -BP and HR currently controlled  Pulmonary Hypertension -See Essential hypertension -Patient understands the severity of his cardiovascular disease is going to attend pulmonary rehabilitation. In addition has agreed to DC smoking.  Essential HTN -Given patient's significant respiratory disease would not use beta blocker;DC metoprolol -Amlodipine 10 mg daily -Lisinopril 10 mg daily  HYPERCHOLESTEROLEMIA - Increase Simvastatin '40mg'$  daily -Lipid panel outside ADA guidelines  Obesity - Patient counseled on diet and weight control  Nicotine abuse - Patient counseled strongly on smoking cessation, placed a nicotine patch  Macrocytosis:  -Possibly due to alcohol, check B12, folate   Goals of care -Will be discharged in a.m. to pulmonary rehabilitation   Code Status: FULL Family Communication: family present at time of exam Disposition Plan: SNF vs home    Consultants: NA  Procedure/Significant Events: 12/5 CXR;Bullous disease  both upper lobes w/ diminished pulmonary vascularity to the upper lobes. Underlying interstitial fibrosis.  12/6 echocardiogram;mild LVH. -LVEF= 55%  to 60%. -Left atrium: moderately dilated- Right ventricle: moderately dilated.  - Right atrium: moderately to  severely dilated. - Pulmonary arteries: PA peak pressure: 51 mm Hg (S).   Culture 12/5 MRSA by PCR negative 12/5 blood right AC/wrist negative final 12/5 urine positive multiple species 12/5 HIV negative 12/6 strep pneumo urine antigen negative 12/6 respiratory virus panel negative 12/6 blood left arm/hand negative final 12/6 sputum not lower respiratory tract secretions   Antibiotics: Azithromycin 12/5>> 12/10 Ceftriaxone 12/5>> 12/10 Levofloxacin 12/10>> 12/12  DVT prophylaxis: Lovenox   Devices    LINES / TUBES:      Continuous Infusions:   Objective: VITAL SIGNS: Temp: 98.9 F (37.2 C) (12/13 1226) Temp Source: Oral (12/13 1226) BP: 114/68 mmHg (12/13 1226) Pulse Rate: 92 (12/13 1226) SPO2; FIO2:   Intake/Output Summary (Last 24 hours) at 10/28/15 1814 Last data filed at 10/28/15 1757  Gross per 24 hour  Intake   1350 ml  Output   1725 ml  Net   -375 ml     Exam: General: A/O 4, positive  acute respiratory distress (significantly improved) Eyes: Negative headache, negative scleral hemorrhage ENT: Negative Runny nose, negative gingival bleeding, Neck:  Negative scars, masses, torticollis, lymphadenopathy, JVD Lungs: decreased air movement but clear to auscultation bilateral, negative expiratory wheezing, negative crackles Cardiovascular: Regular rate and rhythm without murmur gallop or rub normal S1 and S2 Abdomen:negative abdominal pain, nondistended, positive soft, bowel sounds, no rebound, no ascites, no appreciable mass Extremities: No significant cyanosis, clubbing, or edema bilateral lower extremities Psychiatric:  Negative depression, negative anxiety, negative fatigue, negative mania  Neurologic:  Cranial nerves II through XII intact, tongue/uvula midline, all extremities muscle strength 5/5, sensation intact throughout,  negative dysarthria, negative expressive aphasia, negative receptive aphasia.   Data Reviewed: Basic Metabolic  Panel:  Recent Labs Lab 10/22/15 0309 10/23/15 0345 10/26/15 0355 10/27/15 0301  NA 138 138 133* 135  K 5.2* 4.6 5.2* 4.5  CL 102 99* 94* 94*  CO2 '31 30 31 31  '$ GLUCOSE 135* 107* 109* 121*  BUN 20 17 24* 23*  CREATININE 0.89 0.92 0.99 1.10  CALCIUM 8.7* 8.6* 9.1 9.1  MG 2.2  --   --   --    Liver Function Tests:  Recent Labs Lab 10/22/15 0309 10/23/15 0345  AST 31 33  ALT 17 26  ALKPHOS 39 39  BILITOT 0.6 1.0  PROT 6.2* 6.5  ALBUMIN 3.2* 3.2*   No results for input(s): LIPASE, AMYLASE in the last 168 hours. No results for input(s): AMMONIA in the last 168 hours. CBC:  Recent Labs Lab 10/22/15 0309 10/23/15 0345  WBC 14.3* 13.1*  NEUTROABS 12.8*  --   HGB 13.9 14.3  HCT 45.6 47.5  MCV 104.1* 103.3*  PLT 156 166   Cardiac Enzymes: No results for input(s): CKTOTAL, CKMB, CKMBINDEX, TROPONINI in the last 168 hours. BNP (last 3 results)  Recent Labs  10/20/15 1157  BNP 498.4*    ProBNP (last 3 results) No results for input(s): PROBNP in the last 8760 hours.  CBG: No results for input(s): GLUCAP in the last 168 hours.  Recent Results (from the past 240 hour(s))  MRSA PCR Screening     Status: None   Collection Time: 10/20/15  7:27 PM  Result Value Ref Range Status   MRSA by PCR NEGATIVE NEGATIVE Final    Comment:        The GeneXpert MRSA Assay (FDA approved for NASAL specimens only), is one component  of a comprehensive MRSA colonization surveillance program. It is not intended to diagnose MRSA infection nor to guide or monitor treatment for MRSA infections.   Culture, blood (routine x 2) Call MD if unable to obtain prior to antibiotics being given     Status: None   Collection Time: 10/20/15  8:42 PM  Result Value Ref Range Status   Specimen Description BLOOD RIGHT ANTECUBITAL  Final   Special Requests BOTTLES DRAWN AEROBIC AND ANAEROBIC 5CC  Final   Culture NO GROWTH 5 DAYS  Final   Report Status 10/25/2015 FINAL  Final  Culture, blood  (routine x 2) Call MD if unable to obtain prior to antibiotics being given     Status: None   Collection Time: 10/20/15  8:50 PM  Result Value Ref Range Status   Specimen Description BLOOD RIGHT WRIST  Final   Special Requests BOTTLES DRAWN AEROBIC AND ANAEROBIC 10CC  Final   Culture NO GROWTH 5 DAYS  Final   Report Status 10/25/2015 FINAL  Final  Urine culture     Status: None   Collection Time: 10/20/15 10:35 PM  Result Value Ref Range Status   Specimen Description URINE, CLEAN CATCH  Final   Special Requests NONE  Final   Culture MULTIPLE SPECIES PRESENT, SUGGEST RECOLLECTION  Final   Report Status 10/22/2015 FINAL  Final  Respiratory virus panel     Status: None   Collection Time: 10/21/15 12:57 PM  Result Value Ref Range Status   Source - RVPAN NASOPHARYNGEAL  Final   Respiratory Syncytial Virus A Negative Negative Final   Respiratory Syncytial Virus B Negative Negative Final   Influenza A Negative Negative Final   Influenza B Negative Negative Final   Parainfluenza 1 Negative Negative Final   Parainfluenza 2 Negative Negative Final   Parainfluenza 3 Negative Negative Final   Metapneumovirus Negative Negative Final   Rhinovirus Negative Negative Final   Adenovirus Negative Negative Final    Comment: (NOTE) Performed At: [facility] [address] [place], [place] [id number] Lindon Romp MD [id number]   Culture, blood (routine x 2)     Status: None   Collection Time: 10/21/15  2:10 PM  Result Value Ref Range Status   Specimen Description BLOOD LEFT ARM  Final   Special Requests BOTTLES DRAWN AEROBIC ONLY 3CCS  Final   Culture NO GROWTH 5 DAYS  Final   Report Status 10/26/2015 FINAL  Final  Culture, blood (routine x 2)     Status: None   Collection Time: 10/21/15  2:15 PM  Result Value Ref Range Status   Specimen Description BLOOD LEFT HAND  Final   Special Requests IN PEDIATRIC BOTTLE 2.5CCS  Final   Culture NO GROWTH 5 DAYS  Final   Report Status  10/26/2015 FINAL  Final  Culture, expectorated sputum-assessment     Status: None   Collection Time: 10/21/15  8:24 PM  Result Value Ref Range Status   Specimen Description SPUTUM  Final   Special Requests NONE  Final   Sputum evaluation   Final    MICROSCOPIC FINDINGS SUGGEST THAT THIS SPECIMEN IS NOT REPRESENTATIVE OF LOWER RESPIRATORY SECRETIONS. PLEASE RECOLLECT. CRITICAL RESULT CALLED TO, READ BACK BY AND VERIFIED WITH: CHRIS [id number] 10/21/15 mkelly    Report Status 10/21/2015 FINAL  Final     Studies:  Recent x-ray studies have been reviewed in detail by the Attending Physician  Scheduled Meds:  Scheduled Meds: . amLODipine  10 mg Oral Daily  .  antiseptic oral rinse  7 mL Mouth Rinse BID  . aspirin EC  81 mg Oral Daily  . enoxaparin (LOVENOX) injection  40 mg Subcutaneous Q24H  . folic acid  1 mg Oral Daily  . ipratropium-albuterol  3 mL Nebulization TID  . lisinopril  10 mg Oral Daily  . [START ON 10/29/2015] methylPREDNISolone (SOLU-MEDROL) injection  40 mg Intravenous Daily  . mometasone-formoterol  2 puff Inhalation BID  . multivitamin with minerals  1 tablet Oral Daily  . nicotine  21 mg Transdermal Daily  . omega-3 acid ethyl esters  1 g Oral Daily  . pantoprazole  40 mg Oral Q0600  . polyethylene glycol  17 g Oral Daily  . senna-docusate  1 tablet Oral BID  . [START ON 10/29/2015] simvastatin  40 mg Oral q1800  . thiamine  100 mg Oral Daily    Time spent on care of this patient: 40 mins   Elyjah Hazan, Geraldo Docker , MD  [facility] Office  [phone] Pager [phone]  On-Call/Text Page:      [email or web address]      password [email or web address]  If 7PM-7AM, please contact night-coverage [email or web address] Password [email or web address] 10/28/2015, 6:14 PM   LOS: 8 days   Care during the described time interval was provided by me .  I have reviewed this patient's available data, including medical history, events of note, physical examination, and all test results as part of my evaluation. I  have personally reviewed and interpreted all radiology studies.   Dia Crawford, MD [phone] Pager

## 2015-10-29 ENCOUNTER — Telehealth: Payer: Self-pay | Admitting: Pulmonary Disease

## 2015-10-29 MED ORDER — TIOTROPIUM BROMIDE MONOHYDRATE 18 MCG IN CAPS
18.0000 ug | ORAL_CAPSULE | Freq: Every day | RESPIRATORY_TRACT | Status: DC
  Filled 2015-10-29: qty 5

## 2015-10-29 MED ORDER — PREDNISONE 20 MG PO TABS: ORAL_TABLET | ORAL | Status: DC

## 2015-10-29 MED ORDER — ALBUTEROL SULFATE (2.5 MG/3ML) 0.083% IN NEBU
2.5000 mg | INHALATION_SOLUTION | Freq: Four times a day (QID) | RESPIRATORY_TRACT | Status: DC
  Administered 2015-10-29: 2.5 mg via RESPIRATORY_TRACT
  Filled 2015-10-29: qty 3

## 2015-10-29 MED ORDER — NICOTINE 21 MG/24HR TD PT24: 21.0000 mg | MEDICATED_PATCH | Freq: Every day | TRANSDERMAL | Status: DC

## 2015-10-29 MED ORDER — PREDNISONE 20 MG PO TABS: 40.0000 mg | ORAL_TABLET | Freq: Every day | ORAL | Status: DC

## 2015-10-29 MED ORDER — ALBUTEROL SULFATE (2.5 MG/3ML) 0.083% IN NEBU: 3.0000 mL | INHALATION_SOLUTION | Freq: Four times a day (QID) | RESPIRATORY_TRACT | Status: DC | PRN

## 2015-10-29 MED ORDER — SIMVASTATIN 40 MG PO TABS: 40.0000 mg | ORAL_TABLET | Freq: Every day | ORAL | Status: DC

## 2015-10-29 MED ORDER — ALBUTEROL SULFATE HFA 108 (90 BASE) MCG/ACT IN AERS: 2.0000 | INHALATION_SPRAY | Freq: Four times a day (QID) | RESPIRATORY_TRACT | Status: DC | PRN

## 2015-10-29 MED ORDER — SALINE SPRAY 0.65 % NA SOLN: 1.0000 | NASAL | Status: AC | PRN

## 2015-10-29 MED ORDER — ALBUTEROL SULFATE (2.5 MG/3ML) 0.083% IN NEBU: 2.5000 mg | INHALATION_SOLUTION | Freq: Four times a day (QID) | RESPIRATORY_TRACT | Status: DC | PRN

## 2015-10-29 MED ORDER — LISINOPRIL 10 MG PO TABS: 10.0000 mg | ORAL_TABLET | Freq: Every day | ORAL | Status: DC

## 2015-10-29 NOTE — Telephone Encounter (Signed)
Spoke with pt. Advised him that we would have to speak to SN about working him into the scheduled.  SN - please advise if we can work the pt into your schedule. Thanks.

## 2015-10-29 NOTE — Discharge Summary (Signed)
DISCHARGE SUMMARY  [name]  MR#: [id number]  DOB:[birth year]  Date of Admission: 10/20/2015 Date of Discharge: 10/29/2015  Attending Physician:MCCLUNG,JEFFREY T  Patient's WNI:[email or web address], Andrew Reaper, MD  Consults:  none  Disposition: D/C home   Follow-up Appts:     Follow-up Information    Follow up with [facility].   Specialty:  Rehabilitation   Why:  Coy Saunas will call you for a start up date and time for your therapy   Contact information:   [place] [phone]      Follow up with [name], MD.   Specialty:  Internal Medicine   Why:  The hospital secretary will make you a follow up appointment for a visit in 7-10 days.  If you are not given an appointment at the time of your discharge, call the office to arrange.     Contact information:   [address] [place] [address] [phone]       Follow up with Noralee Space, MD.   Specialty:  Pulmonary Disease   Why:  The hospital secretary will make you a follow up appointment for a visit in 7-10 days.  If you are not given an appointment at the time of your discharge, call the office to arrange.    Contact information:   [place] [address] [phone]      Tests Needing Follow-up: reassessment of oxygen needs will be required - pt d/c on new home O2, but was making progress w/ weaning at time of d/c home   Discharge Diagnoses: Acute respiratory failure with hypoxia due to COPD exacerbation Essential HTN HLD  Obesity - Body mass index is 33.29 kg/(m^2). Nicotine abuse Macrocytosis w/o anemia   Initial presentation: 69 year old M Hx Anxiety, Bladder Ca, Colonic Polyps (hyperplastic), COPD with continued Tobacco Abuse (1-2 PPD), CAD, HTN, and HLD who presented with coug, hypoxia, and shortness of breath for 1 month. Per patient, they have mold in the house and his mother-in-law  who has been living in the house has been dusting which flared his symptoms. Patient reported about 15 pound weight gain over the last few months and swelling of his ankles. The patient was seen at his PCPs office and was noted to have hypoxia with O2 sats in the 70s. He was given Solu-Medrol, breathing treatments and O2 and transported to ED. In the ED Chest x-ray showed bullous disease in both upper lobes with diminished pulmonary vascularity to the upper lobes, underlying interstitial fibrosis.  Hospital Course:  Acute respiratory failure with hypoxia due to COPD exacerbation -Ongoing smoking but now understands that he can never smoke again  -Strep pneumo urine antigen / Legionella urine antigen both negative -weaned O2 as able - was not yet on home O2 prior to admit, but O2 support remained necessary at time of d/c - cont in attempts to wean - down to 1L [place] w/ rest at time of d/c, w/ 2-3LPM on exertion or when sleeping    Essential HTN -BP well controlled th/o majority of hospital stay   HLD  -Simvastatin increased to '40mg'$  QD -LDL 98 / HDL 25  Obesity - Body mass index is 33.29 kg/(m^2). -Patient counseled on diet and weight control  Nicotine abuse -Patient counseled strongly on smoking cessation - nicotine patch provided when in hospital   Macrocytosis w/o anemia  -folate and B12 normal/high     Medication List    STOP taking these  medications        metoprolol tartrate 25 MG tablet  Commonly known as:  LOPRESSOR     OCUVITE ADULT 50+ PO      TAKE these medications        albuterol 108 (90 BASE) MCG/ACT inhaler  Commonly known as:  PROVENTIL HFA;VENTOLIN HFA  Inhale 2 puffs into the lungs 4 (four) times daily as needed for wheezing (for use when not at home / unable to use nebulizer).     albuterol (2.5 MG/3ML) 0.083% nebulizer solution  Commonly known as:  PROVENTIL  Take 3 mLs (2.5 mg total) by nebulization 4 (four) times daily as needed for wheezing or shortness  of breath.     ALPRAZolam 0.5 MG tablet  Commonly known as:  XANAX  TAKE ONE TABLET BY MOUTH THREE TIMES DAILY AS NEEDED     amLODipine 10 MG tablet  Commonly known as:  NORVASC  TAKE 1 TABLET (10 MG TOTAL) BY MOUTH DAILY.     aspirin 81 MG tablet  Take 81 mg by mouth at bedtime.     Fish Oil 1200 MG Caps  Take 1 capsule by mouth daily.     Flaxseed Oil 1000 MG Caps  Take 1 capsule by mouth daily.     Fluticasone-Salmeterol 250-50 MCG/DOSE Aepb  Commonly known as:  ADVAIR DISKUS  Inhale 1 puff into the lungs every 12 (twelve) hours.     ibuprofen 600 MG tablet  Commonly known as:  ADVIL,MOTRIN  Take 1 tablet (600 mg total) by mouth daily as needed.     lisinopril 10 MG tablet  Commonly known as:  PRINIVIL,ZESTRIL  Take 1 tablet (10 mg total) by mouth daily.     multivitamin capsule  Take 1 capsule by mouth daily.     nicotine 21 mg/24hr patch  Commonly known as:  NICODERM CQ - dosed in mg/24 hours  Place 1 patch (21 mg total) onto the skin daily.     predniSONE 20 MG tablet  Commonly known as:  DELTASONE  Take 2 tablets a day on 12/15 and 12/16, then one tablet a day for 6 days, then 1/2 (half) a tablet for 6 days, then stop     simvastatin 40 MG tablet  Commonly known as:  ZOCOR  Take 1 tablet (40 mg total) by mouth daily at 6 PM.     sodium chloride 0.65 % Soln nasal spray  Commonly known as:  OCEAN  Place 1 spray into both nostrils as needed for congestion.     tiotropium 18 MCG inhalation capsule  Commonly known as:  SPIRIVA  Place 1 capsule (18 mcg total) into inhaler and inhale daily.        Day of Discharge BP 176/64 mmHg  Pulse 90  Temp(Src) 98.2 F (36.8 C) (Oral)  Resp 18  Ht '5\' 6"'$  (1.676 m)  Wt 91.672 kg (202 lb 1.6 oz)  BMI 32.64 kg/m2  SpO2 92%  Physical Exam: General: No acute respiratory distress at rest on side of bed  Lungs: distant breath sounds th/o - no active wheeze or focal crackles  Cardiovascular: Regular rate and rhythm  without murmur gallop or rub Abdomen: Nontender, nondistended, soft, bowel sounds positive, no rebound, no ascites, no appreciable mass Extremities: No significant cyanosis, clubbing, or edema bilateral lower extremities  Basic Metabolic Panel:  Recent Labs Lab 10/23/15 0345 10/26/15 0355 10/27/15 0301  NA 138 133* 135  K 4.6 5.2* 4.5  CL 99* 94* 94*  CO2 '30 31 31  '$ GLUCOSE 107* 109* 121*  BUN 17 24* 23*  CREATININE 0.92 0.99 1.10  CALCIUM 8.6* 9.1 9.1    Liver Function Tests:  Recent Labs Lab 10/23/15 0345  AST 33  ALT 26  ALKPHOS 39  BILITOT 1.0  PROT 6.5  ALBUMIN 3.2*   CBC:  Recent Labs Lab 10/23/15 0345  WBC 13.1*  HGB 14.3  HCT 47.5  MCV 103.3*  PLT 166    Recent Results (from the past 240 hour(s))  MRSA PCR Screening     Status: None   Collection Time: 10/20/15  7:27 PM  Result Value Ref Range Status   MRSA by PCR NEGATIVE NEGATIVE Final    Comment:        The GeneXpert MRSA Assay (FDA approved for NASAL specimens only), is one component of a comprehensive MRSA colonization surveillance program. It is not intended to diagnose MRSA infection nor to guide or monitor treatment for MRSA infections.   Culture, blood (routine x 2) Call MD if unable to obtain prior to antibiotics being given     Status: None   Collection Time: 10/20/15  8:42 PM  Result Value Ref Range Status   Specimen Description BLOOD RIGHT ANTECUBITAL  Final   Special Requests BOTTLES DRAWN AEROBIC AND ANAEROBIC 5CC  Final   Culture NO GROWTH 5 DAYS  Final   Report Status 10/25/2015 FINAL  Final  Culture, blood (routine x 2) Call MD if unable to obtain prior to antibiotics being given     Status: None   Collection Time: 10/20/15  8:50 PM  Result Value Ref Range Status   Specimen Description BLOOD RIGHT WRIST  Final   Special Requests BOTTLES DRAWN AEROBIC AND ANAEROBIC 10CC  Final   Culture NO GROWTH 5 DAYS  Final   Report Status 10/25/2015 FINAL  Final  Urine culture      Status: None   Collection Time: 10/20/15 10:35 PM  Result Value Ref Range Status   Specimen Description URINE, CLEAN CATCH  Final   Special Requests NONE  Final   Culture MULTIPLE SPECIES PRESENT, SUGGEST RECOLLECTION  Final   Report Status 10/22/2015 FINAL  Final  Respiratory virus panel     Status: None   Collection Time: 10/21/15 12:57 PM  Result Value Ref Range Status   Source - RVPAN NASOPHARYNGEAL  Final   Respiratory Syncytial Virus A Negative Negative Final   Respiratory Syncytial Virus B Negative Negative Final   Influenza A Negative Negative Final   Influenza B Negative Negative Final   Parainfluenza 1 Negative Negative Final   Parainfluenza 2 Negative Negative Final   Parainfluenza 3 Negative Negative Final   Metapneumovirus Negative Negative Final   Rhinovirus Negative Negative Final   Adenovirus Negative Negative Final    Comment: (NOTE) Performed At: [facility] [address] [place], [place] [id number] Lindon Romp MD [id number]   Culture, blood (routine x 2)     Status: None   Collection Time: 10/21/15  2:10 PM  Result Value Ref Range Status   Specimen Description BLOOD LEFT ARM  Final   Special Requests BOTTLES DRAWN AEROBIC ONLY 3CCS  Final   Culture NO GROWTH 5 DAYS  Final   Report Status 10/26/2015 FINAL  Final  Culture, blood (routine x 2)     Status: None   Collection Time: 10/21/15  2:15 PM  Result Value Ref Range Status   Specimen Description BLOOD LEFT HAND  Final  Special Requests IN PEDIATRIC BOTTLE 2.5CCS  Final   Culture NO GROWTH 5 DAYS  Final   Report Status 10/26/2015 FINAL  Final  Culture, expectorated sputum-assessment     Status: None   Collection Time: 10/21/15  8:24 PM  Result Value Ref Range Status   Specimen Description SPUTUM  Final   Special Requests NONE  Final   Sputum evaluation   Final    MICROSCOPIC FINDINGS SUGGEST THAT THIS SPECIMEN IS NOT REPRESENTATIVE OF LOWER RESPIRATORY SECRETIONS. PLEASE  RECOLLECT. CRITICAL RESULT CALLED TO, READ BACK BY AND VERIFIED WITH: CHRIS [id number] 10/21/15 mkelly    Report Status 10/21/2015 FINAL  Final     Time spent in discharge (includes decision making & examination of pt): >35 minutes  10/29/2015, 11:44 AM   Cherene Altes, MD [facility] Office  [phone] Pager [phone]  On-Call/Text Page:      [email or web address]      password [facility]

## 2015-10-29 NOTE — Telephone Encounter (Signed)
Per SN>> add pt to schedule on 10/31/15 at 10am  Attempted to call pt No answer and could not leave a VM  Will try to call pt back later

## 2015-10-29 NOTE — Progress Notes (Signed)
[facility] called for delivery of home oxygen, walker and 3:1 for discharge home today. Mindi Slicker [facility] [phone]

## 2015-10-29 NOTE — Progress Notes (Addendum)
SATURATION QUALIFICATIONS: (This note is used to comply with regulatory documentation for home oxygen)  Patient Saturations on Room Air at Rest = 88%  Patient Saturations on Room Air while Ambulating =80-85 %    Please briefly explain why patient needs home oxygen: Patient noted minor distress with ambulation. Desat in the mids 80s % at this time

## 2015-10-29 NOTE — Progress Notes (Signed)
Discharge instructions reviewed with patient/family. All questions answered at this time. RX given to patient. Transport by family.  Ave Filter, RN

## 2015-10-29 NOTE — Discharge Instructions (Signed)
YOU MUST NEVER EVER SMOKE SO MUCH AS EVEN ONE MORE CIGARETTE!  NEVER!  Chronic Obstructive Pulmonary Disease  Chronic obstructive pulmonary disease (COPD) is a common lung condition in which airflow from the lungs is limited. COPD is a general term that can be used to describe many different lung problems that limit airflow, including both chronic bronchitis and emphysema. If you have COPD, your lung function will probably never return to normal, but there are measures you can take to improve lung function and make yourself feel better. CAUSES   Smoking (common).  Exposure to secondhand smoke.  Genetic problems.  Chronic inflammatory lung diseases or recurrent infections. SYMPTOMS  Shortness of breath, especially with physical activity.  Deep, persistent (chronic) cough with a large amount of thick mucus.  Wheezing.  Rapid breaths (tachypnea).  Gray or bluish discoloration (cyanosis) of the skin, especially in your fingers, toes, or lips.  Fatigue.  Weight loss.  Frequent infections or episodes when breathing symptoms become much worse (exacerbations).  Chest tightness. DIAGNOSIS Your health care provider will take a medical history and perform a physical examination to diagnose COPD. Additional tests for COPD may include:  Lung (pulmonary) function tests.  Chest X-ray.  CT scan.  Blood tests. TREATMENT  Treatment for COPD may include:  Inhaler and nebulizer medicines. These help manage the symptoms of COPD and make your breathing more comfortable.  Supplemental oxygen. Supplemental oxygen is only helpful if you have a low oxygen level in your blood.  Exercise and physical activity. These are beneficial for nearly all people with COPD.  Lung surgery or transplant.  Nutrition therapy to gain weight, if you are underweight.  Pulmonary rehabilitation. This may involve working with a team of health care providers and specialists, such as respiratory, occupational,  and physical therapists. HOME CARE INSTRUCTIONS  Take all medicines (inhaled or pills) as directed by your health care provider.  Avoid over-the-counter medicines or cough syrups that dry up your airway (such as antihistamines) and slow down the elimination of secretions unless instructed otherwise by your health care provider.  If you are a smoker, the most important thing that you can do is stop smoking. Continuing to smoke will cause further lung damage and breathing trouble. Ask your health care provider for help with quitting smoking. He or she can direct you to community resources or hospitals that provide support.  Avoid exposure to irritants such as smoke, chemicals, and fumes that aggravate your breathing.  Use oxygen therapy and pulmonary rehabilitation if directed by your health care provider. If you require home oxygen therapy, ask your health care provider whether you should purchase a pulse oximeter to measure your oxygen level at home.  Avoid contact with individuals who have a contagious illness.  Avoid extreme temperature and humidity changes.  Eat healthy foods. Eating smaller, more frequent meals and resting before meals may help you maintain your strength.  Stay active, but balance activity with periods of rest. Exercise and physical activity will help you maintain your ability to do things you want to do.  Preventing infection and hospitalization is very important when you have COPD. Make sure to receive all the vaccines your health care provider recommends, especially the pneumococcal and influenza vaccines. Ask your health care provider whether you need a pneumonia vaccine.  Learn and use relaxation techniques to manage stress.  Learn and use controlled breathing techniques as directed by your health care provider. Controlled breathing techniques include:  Pursed lip breathing. Start by breathing  in (inhaling) through your nose for 1 second. Then, purse your lips as  if you were going to whistle and breathe out (exhale) through the pursed lips for 2 seconds.  Diaphragmatic breathing. Start by putting one hand on your abdomen just above your waist. Inhale slowly through your nose. The hand on your abdomen should move out. Then purse your lips and exhale slowly. You should be able to feel the hand on your abdomen moving in as you exhale.  Learn and use controlled coughing to clear mucus from your lungs. Controlled coughing is a series of short, progressive coughs. The steps of controlled coughing are: 1. Lean your head slightly forward. 2. Breathe in deeply using diaphragmatic breathing. 3. Try to hold your breath for 3 seconds. 4. Keep your mouth slightly open while coughing twice. 5. Spit any mucus out into a tissue. 6. Rest and repeat the steps once or twice as needed. SEEK MEDICAL CARE IF:  You are coughing up more mucus than usual.  There is a change in the color or thickness of your mucus.  Your breathing is more labored than usual.  Your breathing is faster than usual. SEEK IMMEDIATE MEDICAL CARE IF:  You have shortness of breath while you are resting.  You have shortness of breath that prevents you from:  Being able to talk.  Performing your usual physical activities.  You have chest pain lasting longer than 5 minutes.  Your skin color is more cyanotic than usual.  You measure low oxygen saturations for longer than 5 minutes with a pulse oximeter. MAKE SURE YOU:  Understand these instructions.  Will watch your condition.  Will get help right away if you are not doing well or get worse.   This information is not intended to replace advice given to you by your health care provider. Make sure you discuss any questions you have with your health care provider.   Document Released: 08/11/2005 Document Revised: 11/22/2014 Document Reviewed: 06/28/2013 Elsevier Interactive Patient Education [employer].

## 2015-10-29 NOTE — Progress Notes (Signed)
Physical Therapy Treatment Patient Details Name: [name] MRN: [id number] DOB: 1946-08-23 Today's Date: 10/29/2015    History of Present Illness 69 year old M Hx Anxiety, Bladder Ca, Colonic Polyps (hyperplastic), COPD with continued Tobacco Abuse (1-2 PPD), CAD, HTN, and HLD who presented with coug, hypoxia, and shortness of breath for 1 month.  hypoxia with O2 sats in the 70s. He was given Solu-Medrol, breathing treatments and O2 and transported to ED. In the ED Chest x-ray showed bullous disease in both upper lobes with diminished pulmonary vascularity to the upper lobes, underlying interstitial fibrosis.    PT Comments    Pt performed activity but stated he didn't need any directions/education. He continues to improve his capacity to ambulate on less O2. When he desat'd to 87%, he was easily able to increase his SpO2 to >90% with rest and pursed lip breathing. Will continue to follow to improve activity tolerance. His SpO2 maintained at 95% on 1L O2 when sitting EOB after Tx.    Follow Up Recommendations   Outpatient PT     Equipment Recommendations   2-Wheeled Walker    Recommendations for Other Services       Precautions / Restrictions      Mobility  Bed Mobility               General bed mobility comments: Pt sitting EOB upon arrival  Transfers Overall transfer level: Independent   Transfers: Sit to/from Stand Sit to Stand: Independent            Ambulation/Gait Ambulation/Gait assistance: Supervision Ambulation Distance (Feet): 350 Feet Assistive device: Rolling walker (2 wheeled) Gait Pattern/deviations: Step-through pattern;Decreased stride length;Wide base of support   Gait velocity interpretation: Below normal speed for age/gender General Gait Details: Pt was able to ambulate with RW and was generally steady. Still likes to use the RW. Ambulated on 3L, pt desat to 87% but increased quickly >90% with short rest and purse lip breathing.     Stairs            Wheelchair Mobility    Modified Rankin (Stroke Patients Only)       Balance Overall balance assessment: Needs assistance;History of Falls   Sitting balance-Leahy Scale: Good     Standing balance support: During functional activity;Bilateral upper extremity supported Standing balance-Leahy Scale: Poor Standing balance comment: Pt preferred use of RW when ambulating for steadiness                    Cognition Arousal/Alertness: Awake/alert Behavior During Therapy: WFL for tasks assessed/performed Overall Cognitive Status: Within Functional Limits for tasks assessed                      Exercises General Exercises - Upper Extremity Shoulder Flexion: AROM;Both;10 reps;Seated Shoulder ABduction: AROM;Both;5 reps;Seated General Exercises - Lower Extremity Long Arc Quad: AROM;Both;10 reps;Seated Hip Flexion/Marching: AROM;Seated;Both;10 reps    General Comments        Pertinent Vitals/Pain Pain Assessment: No/denies pain    Home Living                      Prior Function            PT Goals (current goals can now be found in the care plan section) Progress towards PT goals: Progressing toward goals    Frequency  Min 3X/week    PT Plan Current plan remains appropriate    Co-evaluation  End of Session Equipment Utilized During Treatment: Oxygen Activity Tolerance: Patient tolerated treatment well Patient left: with call bell/phone within reach;in bed     Time: 0756-0812 PT Time Calculation (min) (ACUTE ONLY): 16 min  Charges:  $Gait Training: 8-22 mins                    G CodesHaynes [name] 10/30/15, 10:06 AM [name], SPT 2015-10-30 10:06 AM

## 2015-10-30 ENCOUNTER — Other Ambulatory Visit: Payer: Self-pay | Admitting: *Deleted

## 2015-10-30 DIAGNOSIS — J441 Chronic obstructive pulmonary disease with (acute) exacerbation: Secondary | ICD-10-CM

## 2015-10-30 NOTE — Telephone Encounter (Signed)
Spoke with pt's wife. Pt has been added on to SN's schedule. Nothing further was needed.

## 2015-10-30 NOTE — Telephone Encounter (Signed)
ATC, Rang several time with NA. No vmail to leave message [facility]

## 2015-10-30 NOTE — Patient Outreach (Signed)
[facility]) Care Management  [place]  10/30/2015   [name] [birth year] [id number]  Initial Transition of care call  Spoke with patient today concerning his recent discharge from the hospital and introduced to [facility] care management services and purpose of call. Patient reports that he understands his discharge instructions, and states that he has been able to all prescriptions filled, has nebulizer and albuterol for nebulizer, and able to verbalize understanding of how to take tapering doses of prednisone. [name] reports that he slept with his oxygen at 2 liters and has turned it down to 1 liter during the day, he states his O2 saturation is 95%. Denies smoking, and interested in smoking cessation help, reinforced the importance of not smoking. [name] has a follow up appointment with [name] on 12/16, and encouraged him to make a appointment with his PCP,[name], he states that his wife will do that for him. Reminded patient that Outpatient Rehab will call him with start date.  Discussed importance with patient of notifying MD of increase in symptoms of shortness of breath,increased sputum and if nebulizer and inhaler are not helping.  Provided my contact information and encouraged to call for concerns.   Plan Reiterate with patient the importance of taking all medication as prescribed and to notify Md of increased shortness of breath and begin education on COPD yellow zone and actions to take. Continue transition of care call for next week, and schedule home visit Send PCP letter of involvement Consult to pharmacist regarding inhaler medication cost,  Patient concerns about  being in the donut  whole, and smoking cessation    [facility] CM Care Plan Problem One        Most Recent Value   Care Plan Problem One  Recent hospital Admission for COPD with acute exacerbation at risk    Role Documenting the Problem One  Care Management [place] for Problem One  Active   [facility] Long Term Goal (31-90 days)  Patient will not experience a hospital admission related to COPD in the next 60 days   [facility] Long Term Goal Start Date  10/30/15   Interventions for Problem One Long Term Goal  Discussed the importance of notifying MD of increased symptoms,and not getting relief with inhaler and nebulizer treatment to arrange early office visit   [facility] CM Short Term Goal #1 (0-30 days)  Patient will not experience a hospital admission in the next 30 days   [facility] CM Short Term Goal #1 Start Date  10/30/15   Interventions for Short Term Goal #1  Discuss COPD zones and actions to take if an acute episode occurs, reinforced importance of not smoking,importance of taking all medication as prescribed, and to notify MD of problems or concerns    [facility] CM Short Term Goal #2 (0-30 days)  Patient will  verbalize attending  follow up visit with PCP/ Specialist in 7 days of discharge   [facility] CM Short Term Goal #2 Start Date  10/30/15   Interventions for Short Term Goal #2  Discussed importance of earlier follow up with MD after discharge from the hospital, patient has scheduled appointment with pulmonologist on 12/16, wife to call and schedule PCP follow up.     Andrew Draft, RN, [place] [facility] [phone]- Mobile [phone]- Toll Free Main Office

## 2015-10-31 ENCOUNTER — Encounter: Payer: Self-pay | Admitting: *Deleted

## 2015-10-31 ENCOUNTER — Telehealth: Payer: Self-pay | Admitting: Pulmonary Disease

## 2015-10-31 ENCOUNTER — Encounter: Payer: Self-pay | Admitting: Pulmonary Disease

## 2015-10-31 ENCOUNTER — Ambulatory Visit (INDEPENDENT_AMBULATORY_CARE_PROVIDER_SITE_OTHER): Payer: Medicare Other | Admitting: Pulmonary Disease

## 2015-10-31 VITALS — BP 124/76 | HR 72 | Temp 97.8°F | Wt 210.4 lb

## 2015-10-31 DIAGNOSIS — I272 Other secondary pulmonary hypertension: Secondary | ICD-10-CM

## 2015-10-31 DIAGNOSIS — E669 Obesity, unspecified: Secondary | ICD-10-CM

## 2015-10-31 DIAGNOSIS — J9611 Chronic respiratory failure with hypoxia: Secondary | ICD-10-CM | POA: Diagnosis not present

## 2015-10-31 DIAGNOSIS — I2510 Atherosclerotic heart disease of native coronary artery without angina pectoris: Secondary | ICD-10-CM | POA: Diagnosis not present

## 2015-10-31 DIAGNOSIS — J441 Chronic obstructive pulmonary disease with (acute) exacerbation: Secondary | ICD-10-CM | POA: Diagnosis not present

## 2015-10-31 DIAGNOSIS — I10 Essential (primary) hypertension: Secondary | ICD-10-CM | POA: Diagnosis not present

## 2015-10-31 DIAGNOSIS — J449 Chronic obstructive pulmonary disease, unspecified: Secondary | ICD-10-CM

## 2015-10-31 DIAGNOSIS — Z87891 Personal history of nicotine dependence: Secondary | ICD-10-CM

## 2015-10-31 NOTE — Patient Instructions (Signed)
Today we updated your med list in our EPIC system...  Continue the OXYGEN- one liter per min at rest and 2L/min w/ activity/ exercise...  Continue your NEBULIZER  Three times daily...    Followed by the [id number]- twice daily & and the [facility] once daily as we discussed...  Continue the PREDNISONE taper as outlined at your [facility] discharge...  Use the [facility] & flutter valve if you are congested...  And you know the importance of not smoking anymore...  Call for any questions...  Let's plan a follow up visit in 3 weeks, sooner if needed for problems.Marland KitchenMarland Kitchen

## 2015-10-31 NOTE — Progress Notes (Signed)
Subjective:    Patient ID: [name], male    DOB: Jun 02, 1946, 69 y.o.   MRN: [id number]  HPI 69 y/o WM here for a follow up visit and on-going management of mult med problems...  ~  SEE PREV EPIC NOTES FOR OLDER DATA >>    CXR 7/14 showed COPD/ emphysema, incr markings and scarring at the bases, NAD...  LABS 7/14:  FLP- ok on Simva20;  Chems- ok x BS=120 A1c=6.7;  CBC- wnl;  TSH=1.26;  PSA=0.41...  [name] has established [name] follow up w/ [name] at [name]>>   CXR 7/15 showed underlying COPD, mild bibasilar fibrosis, norm heart size, DJD in spine, NAD...   PFT 7/15 showed> FVC=3.18 (78%), FEV1=1.22 (38%), %1sec=38; mid-flows=13% predicted... This is c/w GOLD Stage3 COPD and we discussed this...  ~  December 05, 2014:  79moROV & pulmonary recheck; [name] continued smoking, perhaps decr slightly to 1/2ppd but unable to quit & he has gained 6# in the interim up to 221# today;  His CC is sinusitis w/ nasal congestion, drainage & green mucus plus maxillary/ frontal HAs; he denies f/c/s, notes stable cough & chest symptoms w/ DOE;  He has been taking his Advair250Bid but prev refused addition of an anticholinergic due to "the donut hole";  States his breathing is "fine, except in cold air" ( we discussed wearing a scarf in winter etc)... He has GOLD Stage3 COPD w/ FEV1=1.22 (38%predicted) done 7/15;;  Exam shows decr BS bilat w/ few scat rhonchi & mild exp wheezing esp w/ forced maneuver...     We reviewed prob list, meds, xrays and labs> see below for updates >> he is followed by [name] and sees him once per year... PLAN>> we discussed treating his acute max sinusitis w/ Augmentin875Bid, plus align daily & nasal saline as needed; he knows the importance of smoking cessation but is not motivated & declines smoking cessation help; he will continue the Advair250Bid 7 we are adding spiriva via handihaler once per day; we plan ROV in 681mo/ f/u CXR & PFT,  call in the interim for any breathing issues....   ~  June 05, 2015:  64m70moV & [name] a Andrew interval, no new complaints or concerns; still smoking +/-1ppd and blames family stress; notes cough, sm amy yellow sput, no hemoptysis, SOB w/o change/stable- still mows 3 yards, does chores, tends to 20+chickens & their eggs, etc; he has repeatedly declined smoking cessation help, clearly not motivated to quit, not even doing his inhalers regularly!!!    COPD, +smoker> on Advair250Bid & Spiriva daily; symptoms as above, he is stoic, still smoking 1ppd & NOT motivated to quit, asked to cut back & use both inhalers regularly...    HBP> on Metop25Bid, Amlod10, Lisin40; BP=114/64 & he denies CP, palpit, SOB, edema...    CAD> on ASA81; followed by [name] & seen 4/15 (overdue for f/u)> CAD, IWMI 1995, mult PCIs, last Myoview 2013 was low risk; too sedentary, no changes made; 73 65o brother died w/ AAA rupture...    Chol> on Simva20; FLP 7/14 showed TChol 158, TG 145, HDL 33, LDL 96; needs better diet & wt reduction and ret for Fasting blood work overdue.    DM> on diet alone; LABS 7/14 showed BS= 120, A1c= 6.7; we reviewed low carb diet, exercise program, and wt reduction...    Obese> wt recorded as 204# today but it doesn't look like he's lost 26#; we reviewed diet &  exercise...    GI- Gastritis, polyps> prev on Zantac; prev colon 2004 w/ hyperplastic polyp removed & f/u 8/14 by [name] w/ 3 polyps removed (5-60m size, all were tubular adenomas), +divertics in sigmoid, f/u planned 5 yrs... NOTE> exam showed a small palp knot in mid-abd above the umbilicus ?etiology (I offered CT Abd but he declines & wants to discuss w/ his primary- [name]).    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    DJD> on OTC analgesics  as needed...    Anxiety> on Alpraz0.18m57mrn... We reviewed prob list, meds, xrays and labs> see below for updates >>  NOTE> HE IS FOLLOWED BY [name] for GEN MED/ PCP now...  CXR 06/05/15 showed stable heart size, Ao atherosclerosis & tortuousiy, COPD/emphysema, incr markings at the bases, arthritis in Tspine...  LABS 7/16: pt asked to ret for fasting blood work... IMP/PLAN>>  [name] he is stable but continues to smoke 1ppd; CXR w/ COPD/emphysema, atherosclerotic Ao, chronic changes but NAD- we discussed the low-dose screening CT Chest for the early detection of lung cancer (he is a candidate & he will consider participating in this program); in the meanwhile- continue Advair/ Spiriva regularly;  He is overdue for Cards f/u w/ [name], and needs to continue Q6mo29mo w/ [name] (w/ copy of notes to us);[name] will ret for FASTING blood work.  ADDENDUM>> given his age and smoking history, [name] candidate for LUNG CANCER SCREENING w/ LOW-DOSE CT Chest>>  This office visit constitutes a face-to-face visit for the purpose of lung cancer screening counseling and a shared decision making visit...  Eligibility for LDCT screening:  1) Age=70 (must be 55-77y/o);  2) Absence of symptoms of lung cancer: he denies CP, ch in SOB, hemoptysis.  Smoking History:  1) He has 60+ pk-yrs (must have >30 pk-yr hx smoking);  2) Smoking status- still smoking ~1ppd (current or former smoker who quit<15 yrs ago).  Counseling:  Importance of smoking cessation &/or continued abstinence discussed ; offered smoking cessation interventions- pt declined; pt agreed to continued LDCT screening annually.  Shared decision making:  We reviewed the potential benefits and harms of screening> eg. Early detection of lung cancer and improved survival, the need for additional diagnostic tests & possible surgery, the risk of over-diagnosis/ false positives/ and radiation exposure... All questions were answered to the best  of my ability & he would like to consider the LDCT screening & he will let us kKoreaw his decision=> he ignored this discussion & never called.  ~  October 31, 2015:  5 month ROV & post hospital follow up visit>  [name] was Adm 12/5 - 10/29/15 by [facility] w/ acute hypoxemic resp failure precipitated by a COPD exac; for his part he noted incr SOB, cough, green sput, & "slowing down", he denied f/c/s, no CP, etc;  In the ER he was hypoxemic w/ O2sats in the 70s on RA and eval revealed> CXR- COPD, bullous dis, incr markings at bases, NAD; Cultures of blood/ sput/ urine were all neg; Serologies for strep & legionella were neg; Viral panel was neg;  LABs were OK (WBC=7.9=>14, Hg=15.3=>13.9, BS=100-140, BNP=498);  EKG showed NSR, rate78, NSSTTWA;  2DEcho 10/21/15 showed mildLVH, norm LVF w/ EF=55-60%, no regional wall motion abn, LA-mod  dil, RA- mod to severe dil, RV- mod dil, mod TR, PAsys-82mHg... He was treated w/ Oxygen, IV Solumedrol, Rocephin/ Zithromax, NEBS, Lovenox, flutter, etc; he was disch on Home O2- 2L/min activ & 1L/min rest, Pred taper, Levaquin, Advair250/ Spiriva...  He returns today feeling better, notes his home pulse-ox checks are improving & he has less SOB/DOE, less cough/sput, etc;  He says he has not smoked since PTA...      EXAM shows Afeb, VSS, O2sat=94% on 2L/min [place];  Wt=210# (he was 204 prev);  HEENT- neg, mallampati1;  Chest- decr BS bilat, few scat rhonchi, no w/r/consolidation;  Heart- RR w/o m/r/g;  Abd- obese, soft;  Ext- neg w/o c/c/e...  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           Problem List:  Hx of SINUSITIS  (ICD-473.9) - off Nasonex now & recent Rx w/ Augmentin/ Saline... notes occas sinus congestion, drainage, sl HA, etc... he knows that he needs to quit smoking! ~  10/10: Adm for epistaxis w/ eval by DrWolicki- CXR= NAD, labs OK, rec for saline lavage- nose bleed resolved & disch home (no recurrence so far). ~  1/16: he presented w/ acute max & frontal sinusitis> Rx w/ augmentin, Align, Saline, Mucinex, etc...  COPD (ICD-496) - on ADVAIR 250Bid; STILL SMOKES CIGARETTES & he's refused Chantix...  ~  He had hemoptysis 6/99 without lesion on CXR, CTChest, or Bronchoscopy... ~  Adm 10/10 w/ epistaxsis & eval DrWolicki as noted> he wanted to do Sleep Study but pt cancelled it & declined to resched. ~  CXR 5/11 showed COPD/ bullous emphysema, NAD..[name] ~  CXR 5/12 showed ectatic calcif & tort Aorta, COPD w/ incr markings ar bases, NAD, +degen spondylosis. ~  CXR 6/13 showed COPD/ emphysema, scarring in the lower lobes, otherw clear lungs, DJD in spine... ~  CXR 7/14 showed COPD/ emphysema, incr markings and scarring at the bases, NAD. ~  1/15: Andrew Bittersays he's decr his smoking from prev 3-4ppd down to 3 cig/d;  He remains on Advair250> states breathing is ok, at baseline CAT score=12; baseline cough, sput, no hemoptysis, stable DOE, no edema... ~  CXR 7/15 showed underlying COPD, mild bibasilar fibrosis, norm heart size, DJD in spine, NAD...  ~  PFT 7/15 showed> FVC=3.18 (78%), FEV1=1.22 (38%), %1sec=38; mid-flows=13% predicted... This is c/w GOLD Stage3 COPD and we discussed this> He is rec to quit all smoking immediately; he has been using the nicotine gum & rec to try patches, offered Wellbutrin, he refuses Chantix; needs to continue ADVAIR250Bid & samples givem; he declines to start [place] this time due to cost & donut hole (we will address this again on return); reminded to use MUCINEX '600mg'$ - 1to2 Bid w/ fluids, and OK to use OTC DELSYM as needed for cough...   ~  1/16: he claims stable on Advair250 but  still smoking & can't vs won't quit; we decided to add Spiriva daily via handihaler... ~  7/16: on Advair250Bid & Spiriva daily; symptoms as above, he is stoic, still smoking 1ppd & NOT motivated to quit, asked to cut back & use both inhalers  regularly;  We reviewed the low-dose screening CT Chest program for the early detection of lung cvancer- he will consider this & let me know his decision... ~  12/16:  He was [facility] for 9d w/ hypoxemic resp failure, COPD exac, and found to have pulmHTN w/ 2DEcho showing PAsys=51;  Treated w/ O2, Pred taper, antibiotics, NEBS, Advair/Spiriva, etc;  He says he has quit smoking...    HE HAS ESTABLISHED PRIMARY [name] w/ [name], [facility]>>   HYPERTENSION (ICD-401.9) - controlled on METOPROLOL '25mg'$ Tid,  NORVASC '10mg'$ /d, LISINOPRIL '40mg'$ /d...   ~  11/11:  BP=122/70, not checking BP's at home... denies HA, fatigue, visual changes, CP, palipit, dizziness, syncope, dyspnea, edema, etc. ~  5/12:  BP= 120/72, pt very pleased w/ his wt loss & improved exercise ability. ~  6/13:  BP= 122/64 & feeling well w/o CP, palpit, SOB, edema, etc... ~  12/13:  BP=122/82 & he denies CP, palpit, SOB, edema... ~  7/14: on Metop25-3/d, Amlod10, Lisin40; BP=130/78 & he denies CP, palpit, SOB, edema ~  1/15: BP remains stable on Metop25-2AM & 1PM, Amlod5, Lisin40; BP= 120/72 and he denies CP, palpit, ch in SOB, edema.  CORONARY ARTERY DISEASE (ICD-414.00) - ASA '81mg'$ /d & off Plavix as of 2/13 officially as he wasn't taking it regularly anyway. ~  Hx prev IWMI and PTCA/ stent in RCA by DrBrodie 12/95 & 5/96...  ~  Myoview 4/08 abnormal w/ inferior ischemia and subseq cath revealing restenoses in RCA- s/p repeat PTCA/ stents...  ~  f/u Myoview 9/08 w/ prev infer infarct, no ischemia, EF=56%... ~  9/11:  pre op cardiac eval DrBrodie> OK to hold ASA/ Plavix for bladder surg. ~  2/12: f/u [name] w/ hx CAD, MI in 95, mult PCIs last 2009> stable, on ASA/ Plavix, no changes made; EKG=  NSR, WNL... ~  2/13:  He had f/u [name] w/ Myoview showing a fixed defect in infer wall, no ischemia, EF=58%; pt wasn't taking Plavix, therefore stopped in favor of ASA'81mg'$ /d... ~  3/14:  He had f/u [name]> CAD, IWMI 1995, mult PCIs, last Myoview 2013 was low risk; too sedentary, no changes made; 29 y/o brother died recently w/ AAA rupture... ~  EKG 3/14 showed NSR, rate65, WNL, NAD...  HYPERCHOLESTEROLEMIA (Mixed Hyperlipidemia) - prev on Simva80 but DrBrodie decr him to SIMVASTATIN '20mg'$ /d + Fish Oil & Red Wine qd... ~  Aldrich 9/08 on Simva40 showed TChol 151, TG 106, HDL 23, LDL 107 ~  FLP 8/09 on Simva80 showed TChol 142, TG 135, HDL 28, LDL 87 ~  FLP 6/10 on Simva80 showed TChol 151, TG 117, HDL 31, LDL 97 ~  FLP in hosp 10/10 showed TChol 128, TG 110, HDL 26, LDL 80 ~  FLP 5/11 on Simva80 showed TChol 144, TG 215, HDL 26, LDL 89 ~  9/11:  DrBrodie decr him to Simva20... we discussed the need for better diet & wt reduction. ~  FLP 5/12 on Simva20 showed TChol 162, TG 159, HDL 32, LDL 98 ~  FLP 6/13 on Simva20 showed TChol 130, TG 102, HDL 33, LDL 77... rec to incr exercise program & get wt down! ~  FLP 7/14 on Simva20 showed TChol 158, TG 145, HDL 33, LDL 96... He has gained way too much wt 7 must get on diet!  DIABETES MELLITUS, BORDERLINE (ICD-790.29) - on diet alone... ~  labs 9/08 showed BS=100, HgA1c=6.1 ~  labs 8/09 showed BS= 107, A1c= 6.2 ~  labs 6/10 showed BS= 108, A1c= 6.2 ~  lab 5/11 (wt=244#) showed BS= 121. A1c= 6.7.[name]. he was told "get wt down, or start meds". ~  Labs 5/12 showed BS= 109, A1c= 6.1 ~  Labs 6/13 showed BS= 114, & he needs to lose the weight... ~  Labs 7/14 on diet alone showed BS= 120, A1c= 6.7  OBESITY (ICD-278.00) - weight was down as low as 202# in 2/09, and steadily incr to 244# by 5/11... ~  weight 5/11 = 244#.[name].  we reviewed diet + exercise necessary to lose weight... ~  weight 11/11 = 217# (after dx & rx for bladder cancer) ~  Weight 5/12 = 214#  but says it's 192# at home... ~  Weight 6/13 = 211# but knows it's better since his pant/ belt is down to 36" (under his roll). ~  Weight 7/14 = 230# but he has quit smoking he says, now must lose the weight... ~  Weight 1/15 = 229#  GASTRITIS (ICD-535.50) - had EGD 10/05 showing gastritis... on RANITADINE '300mg'$ /d due to his Plavix Rx, off prev Omep.  COLONIC POLYPS (ICD-211.3) - last colonoscopy by [name] 3/04 showed 86m polyp = hyperplastic w/ f/u planned 132yr. 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 ~  4/12:  Pt reported f/u cysto was neg, doing well... ~  10/12:  Note from [name] reviewed> cysto was neg, no recurrent tumors... ~  4/13:  Note from [name] reviewed> neg f/u cysto w/o recurrent TCCa... He wants to continue Q6m37mostoscopies... ~  1/14:  He had f/u w/ [name]> Hx TCCa bladder, s/p TURBT 9/11, given BCG 11/11-12/11 and f/u Cysto 1/12 was neg; repeat cysto 1/14 was neg as well...  DEGENERATIVE JOINT DISEASE (ICD-715.90) - He has c/o right shoulder pain, hip pain, knee pain, and LBP> Rx ADVIL w/ some benefit... offered Ortho referral & he will decide.  ANXIETY (ICD-300.00) - on ALPRAZOLAM 0.'5mg'$  Prn...   Past Surgical History  Procedure Laterality Date  . Cataract surgery/sub posterior vitrectomy in left eye  1996    Dr. RanZadie Rhine Cataract surgery  septemeber 2013  . Ptca  [phone] Outpatient Encounter Prescriptions as of 10/31/2015  Medication Sig  . albuterol (PROVENTIL HFA;VENTOLIN HFA) 108 (90 BASE) MCG/ACT inhaler Inhale 2 puffs into the lungs 4 (four) times daily as needed for wheezing (for use when not at home / unable to use nebulizer).  . aMarland Kitchenbuterol (PROVENTIL) (2.5 MG/3ML) 0.083% nebulizer  solution Take 3 mLs (2.5 mg total) by nebulization 4 (four) times daily as needed for wheezing or shortness of breath.  . ALPRAZolam (XANAX) 0.5 MG tablet TAKE ONE TABLET BY MOUTH THREE TIMES DAILY AS NEEDED (Patient taking differently: TAKE ONE TABLET BY MOUTH THREE TIMES DAILY AS NEEDED for anxiety or sleep)  . amLODipine (NORVASC) 10 MG tablet TAKE 1 TABLET (10 MG TOTAL) BY MOUTH DAILY.  . aMarland Kitchenpirin 81 MG tablet Take 81 mg by mouth at bedtime.   . Flaxseed, Linseed, (FLAXSEED OIL) 1000 MG CAPS Take 1 capsule by mouth daily.    . Fluticasone-Salmeterol (ADVAIR DISKUS) 250-50 MCG/DOSE AEPB Inhale 1 puff into the lungs every 12 (twelve) hours.  . iMarland Kitchenuprofen (ADVIL,MOTRIN) 600 MG tablet Take 1 tablet (600 mg total) by mouth daily as needed. (Patient  taking differently: Take 600 mg by mouth daily as needed for moderate pain. )  . lisinopril (PRINIVIL,ZESTRIL) 10 MG tablet Take 1 tablet (10 mg total) by mouth daily.  . Multiple Vitamin (MULTIVITAMIN) capsule Take 1 capsule by mouth daily.    . nicotine (NICODERM CQ - DOSED IN MG/24 HOURS) 21 mg/24hr patch Place 1 patch (21 mg total) onto the skin daily.  . Omega-3 Fatty Acids (FISH OIL) 1200 MG CAPS Take 1 capsule by mouth daily.    . predniSONE (DELTASONE) 20 MG tablet Take 2 tablets a day on 12/15 and 12/16, then one tablet a day for 6 days, then 1/2 (half) a tablet for 6 days, then stop  . simvastatin (ZOCOR) 40 MG tablet Take 1 tablet (40 mg total) by mouth daily at 6 PM.  . sodium chloride (OCEAN) 0.65 % SOLN nasal spray Place 1 spray into both nostrils as needed for congestion.  [name] tiotropium (SPIRIVA) 18 MCG inhalation capsule Place 1 capsule (18 mcg total) into inhaler and inhale daily.   No facility-administered encounter medications on file as of 10/31/2015.    Allergies  Allergen Reactions  . Dextromethorphan-Guaifenesin     REACTION: GI upset    Current Medications, Allergies, Past Medical History, Past Surgical History, Family History,  and Social History were reviewed in [employer] record.    Review of Systems        See HPI - all other systems neg except as noted... The patient complains of some dyspnea on exertion.  The patient denies anorexia, fever, weight loss, weight gain, vision loss, decreased hearing, hoarseness, chest pain, syncope, peripheral edema, prolonged cough, headaches, hemoptysis, abdominal pain, melena, hematochezia, severe indigestion/heartburn, hematuria, incontinence, muscle weakness, suspicious skin lesions, transient blindness, difficulty walking, depression, unusual weight change, abnormal bleeding, enlarged lymph nodes, and angioedema.     Objective:   Physical Exam    WD, Obese, 69 y/o WM in NAD... Vital Signs:  Reviewed... GENERAL:  Alert & oriented; pleasant & cooperative... HEENT:  [place]/AT, EOM-wnl, PERRLA, EACs-clear, TMs-wnl, NOSE- sl congestion, THROAT-clear & wnl. NECK:  Supple w/ fairROM; no JVD; normal carotid impulses w/o bruits; no thyromegaly or nodules palpated; no lymphadenopathy. CHEST:  decr BS at bases bilat w/ few scat rhonchi, no wheezing/ rales/ or signs of consolidation... HEART:  Regular Rhythm; without murmurs/ rubs/ or gallops detected... ABDOMEN:  Obese, soft, & nontender; sm knot palpated in mid abd above umbilicus; normal bowel sounds; no organomegaly detected. EXT: without deformities, mild arthritic changes; no varicose veins/ +venous insuffic/ no edema. NEURO:  CN's intact; no focal neuro deficits... DERM:  No lesions noted; no rash etc...  RADIOLOGY DATA:  Reviewed in the EPIC EMR & discussed w/ the patient...  LABORATORY DATA:  Reviewed in the EPIC EMR & discussed w/ the patient...   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   Acute sinusitis> presented 1/16 w/ 2wk hx bilat max & frontal sinusitis; treated w/ Augmentin875 Bid + nasal saline etc...   Primary [name] followed by [name]>> HBP>  .. CAD>   Followed by [name]>  he is overdue for f/u OV... LIPIDS>   DM>   GI> Hx Gastritis, Polyps>   GU>   Followed by [name]   Patient's Medications  New Prescriptions   No medications on file  Previous Medications   ALBUTEROL (PROVENTIL HFA;VENTOLIN HFA) 108 (90 BASE) MCG/ACT INHALER    Inhale 2 puffs into the lungs 4 (four) times daily as needed for wheezing (for use when not at home / unable to use nebulizer).   ALBUTEROL (PROVENTIL) (2.5 MG/3ML) 0.083% NEBULIZER SOLUTION    Take 3 mLs (2.5 mg total) by nebulization 4 (four) times daily as needed for wheezing or shortness of breath.   ALPRAZOLAM (XANAX) 0.5 MG TABLET    TAKE ONE TABLET BY MOUTH THREE TIMES DAILY AS NEEDED   AMLODIPINE (NORVASC) 10 MG TABLET    TAKE 1 TABLET (10 MG TOTAL) BY MOUTH DAILY.   ASPIRIN 81 MG TABLET    Take 81 mg by mouth at bedtime.    FLAXSEED, LINSEED, (FLAXSEED OIL) 1000 MG CAPS    Take 1 capsule by mouth daily.     FLUTICASONE-SALMETEROL (ADVAIR DISKUS) 250-50 MCG/DOSE AEPB    Inhale 1 puff into the lungs every 12 (twelve) hours.   IBUPROFEN (ADVIL,MOTRIN) 600 MG TABLET    Take 1 tablet (600 mg total) by mouth daily as needed.   LISINOPRIL (PRINIVIL,ZESTRIL) 10 MG TABLET    Take 1 tablet (10 mg total) by mouth daily.   MULTIPLE VITAMIN (MULTIVITAMIN) CAPSULE    Take 1 capsule by mouth daily.     NICOTINE (NICODERM CQ - DOSED IN MG/24 HOURS) 21 MG/24HR PATCH    Place 1 patch (21 mg total) onto the skin daily.   OMEGA-3 FATTY ACIDS (FISH OIL) 1200 MG CAPS    Take 1 capsule by mouth daily.     PREDNISONE (DELTASONE) 20 MG TABLET    Take 2 tablets a day on  12/15 and 12/16, then one tablet a day for 6 days, then 1/2 (half) a tablet for 6 days, then stop   SIMVASTATIN (ZOCOR) 40 MG TABLET    Take 1 tablet (40 mg total) by mouth daily at 6 PM.   SODIUM CHLORIDE (OCEAN) 0.65 % SOLN NASAL SPRAY    Place 1 spray into both nostrils as needed for congestion.   TIOTROPIUM (SPIRIVA) 18 MCG INHALATION CAPSULE    Place 1 capsule (18 mcg total) into inhaler and inhale daily.  Modified Medications   No medications on file  Discontinued Medications   No medications on file

## 2015-11-03 NOTE — Telephone Encounter (Signed)
[name] please advise where patient can be worked in to [employer] schedule. Seen 10/31/15 and needs a 3 week recheck. Thanks.

## 2015-11-04 NOTE — Telephone Encounter (Signed)
[name] please advise. Thanks.

## 2015-11-04 NOTE — Telephone Encounter (Signed)
Per SN>> Schedule pt for follow up appt on 11-21-15 at [facility] and spoke with pt's wife and she stated that this appt time and date was fine  Pt added to SN schedule  Nothing further is needed

## 2015-11-06 ENCOUNTER — Other Ambulatory Visit: Payer: Self-pay | Admitting: *Deleted

## 2015-11-06 ENCOUNTER — Encounter: Payer: Self-pay | Admitting: Pharmacist

## 2015-11-06 ENCOUNTER — Other Ambulatory Visit: Payer: Self-pay | Admitting: Pharmacist

## 2015-11-06 ENCOUNTER — Ambulatory Visit: Payer: Medicare Other | Attending: Internal Medicine

## 2015-11-06 DIAGNOSIS — R5381 Other malaise: Secondary | ICD-10-CM | POA: Diagnosis not present

## 2015-11-06 DIAGNOSIS — M6281 Muscle weakness (generalized): Secondary | ICD-10-CM | POA: Diagnosis not present

## 2015-11-06 NOTE — Therapy (Addendum)
[facility] [address] [place], [place], [address] Phone: [phone]   Fax:  [phone]  Physical Therapy Evaluation  Patient Details  Name: [name] MRN: [id number] Date of Birth: 1946/01/28 Referring Provider: Dr. Thereasa Solo  Encounter Date: 11/06/2015      PT End of Session - 11/06/15 1457    Visit Number 1   Number of Visits 1   Date for PT Re-Evaluation 11/06/15   Authorization Type G-code   PT Start Time 1019   PT Stop Time 1050   PT Time Calculation (min) 31 min   Equipment Utilized During Treatment Gait belt;Oxygen   Activity Tolerance Other (comment)  limited by decr. SaO2 on SpO2   Behavior During Therapy [facility] for tasks assessed/performed      Past Medical History  Diagnosis Date  . History of sinusitis   . Epistaxis   . COPD (chronic obstructive pulmonary disease) ([place])   . Cigarette smoker   . Hypertension   . CAD (coronary artery disease)   . Hypercholesteremia   . Borderline diabetes mellitus   . Obesity   . History of colonic polyps 2004    hyperplastic   . DJD (degenerative joint disease)   . Anxiety   . Bladder cancer ([place]) 2011    Past Surgical History  Procedure Laterality Date  . Cataract surgery/sub posterior vitrectomy in left eye  1996    Dr. Zadie Rhine  . Cataract surgery  septemeber 2013  . Ptca  [phone]    There were no vitals filed for this visit.  Addendum by Billie Ruddy, PT, DPT [facility] [address] [place], [place], [address] Phone: [phone]   Fax:  [phone] 05/10/2016, 1:27 PM  Visit Diagnosis: Muscle weakness (generalized) - Plan: PT plan of care cert/re-cert        Subjective Assessment - 11/06/15 1028    Subjective Pt reported he was in the hospital on 10/20/15 for COPD exerabation. Pt wears oxygen at all times 1L/min when resting and 2L/min when active and at night. Pt denied falls in  last 6 months and feels balance is back to normal and does not require AD to amb.  Pt not sure why he's at PT, as he thought that he would go to pulmonary rehab.   Patient is accompained by: Family member  dtr-[name]   Patient Stated Goals Back to my normal life, without oxygen. No mobility goals, he feels balance and gait is back to normal.   Currently in Pain? No/denies            [facility] PT Assessment - 11/06/15 1032    Assessment   Medical Diagnosis Acute repiratory failure   Referring Provider Dr. Thereasa Solo   Onset Date/Surgical Date 10/20/15   Prior Therapy none   Precautions   Precautions None   Restrictions   Weight Bearing Restrictions No   Balance Screen   Has the patient fallen in the past 6 months No   Has the patient had a decrease in activity level because of a fear of falling?  No   Is the patient reluctant to leave their home because of a fear of falling?  No   Home Social worker Private residence   Living Arrangements Children;Spouse/significant other   Available Help at Discharge Family   Type of [place] to enter   Entrance Stairs-Number of Steps 7-8   Entrance Stairs-Rails Right  Home Layout One level   [place] - 2 wheels;[place]asandra Knudsen - single point   Prior Function   Level of Independence Independent   Vocation Retired   Charity fundraiser Status Within Functional Limits for tasks assessed   Sensation   Light Touch Appears Intact   Additional Comments Denies N/T   Coordination   Gross Motor Movements are Fluid and Coordinated Not tested   Fine Motor Movements are Fluid and Coordinated Not tested   ROM / Strength   AROM / PROM / Strength AROM;Strength   AROM   Overall AROM  Within functional limits for tasks performed   Overall AROM Comments B UE/LE ROM   Strength   Overall Strength Within functional limits for tasks performed   Overall Strength Comments B LE strength: globally 4-5/5 and B  UE strength WFL.   Transfers   Transfers Sit to Stand;Stand to Sit   Sit to Stand 7: Independent;Without upper extremity assist;From chair/3-in-1   Stand to Sit 7: Independent;Without upper extremity assist;To chair/3-in-1   Ambulation/Gait   Ambulation/Gait Yes   Ambulation/Gait Assistance 6: Modified independent (Device/Increase time)  increased time, amb. with SpO2   Ambulation Distance (Feet) 300 Feet   Assistive device None   Gait Pattern Within Functional Limits   Ambulation Surface Level;Indoor   Gait velocity 2.63f/sec.  no AD   Standardized Balance Assessment   Standardized Balance Assessment Timed Up and Go Test;Dynamic Gait Index   Dynamic Gait Index   Level Surface Normal   Change in Gait Speed Normal   Gait with Horizontal Head Turns Normal   Gait with Vertical Head Turns Normal   Gait and Pivot Turn Moderate Impairment   Step Over Obstacle Normal   Step Around Obstacles Normal   Steps Mild Impairment   Total Score 21   Timed Up and Go Test   TUG Normal TUG   Normal TUG (seconds) 9.4                           PT Education - 11/06/15 1456    Education provided Yes   Education Details PT discussed outcome measure results and that pt does not require PT at this time, as he is back to PLOF and not at falls risk. Pt would benefit from pulmonary rehab, as pt's SaO2 on 1.5-2.0L of O2 decresaed during activity to 88% but increased. to >/=92% with seated rest break and pursed lip breathing.    Person(s) Educated Patient;Child(ren)   Methods Explanation   Comprehension Verbalized understanding          PT Short Term Goals - 11/06/15 1500    PT SHORT TERM GOAL #1   Title eval only           PT Long Term Goals - 11/06/15 1500    PT LONG TERM GOAL #1   Title eval only               Plan - 11/06/15 1457    Clinical Impression Statement Pt is a pleasant 89y/omale presenting to OPPT neuro with acute respiratory failure. Pt's gait,  strength, and balance was [facility] DGI score indicates pt is not at risk for falls. Pt did experience decrease SaO2 on 1.5-2.0L of O2 during activity and required seated rest breaks and pursed lip breathing to incr. SaO2 >/=92%. Pt does not require PT services at this time but would benefit from pulmonary rehab to improve  endurance and decr. reliance of SpO2.    PT Frequency One time visit   Consulted and Agree with Plan of Care Patient;Family member/caregiver   Family Member Consulted pt dtr-[name]          G-Codes - Dec 02, 2015 1500    Functional Assessment Tool Used DGI: 21/24; gait speed no AD: 2.31f/sec; TUG no AD: 9.4 sec.   Functional Limitation Mobility: Walking and moving around   Mobility: Walking and Moving Around Current Status [phone] At least 1 percent but less than 20 percent impaired, limited or restricted   Mobility: Walking and Moving Around Goal Status [phone] At least 1 percent but less than 20 percent impaired, limited or restricted   Mobility: Walking and Moving Around Discharge Status [phone] At least 1 percent but less than 20 percent impaired, limited or restricted       Problem List Patient Active Problem List   Diagnosis Date Noted  . Chronic respiratory failure with hypoxia ([place] 10/31/2015  . Ex-smoker 10/31/2015  . Dilated cardiomyopathy ([place]   . Pulmonary hypertension ([place]   . COPD exacerbation ([place] 10/20/2015  . Nicotine abuse 10/20/2015  . 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 LBP (low back pain) 03/24/2011  . CARCINOMA, BLADDER, TRANSITIONAL CELL 09/22/2010  . HEMATURIA UNSPECIFIED 05/14/2010  . DEGENERATIVE JOINT DISEASE 03/28/2010  . CIGARETTE SMOKER 06/20/2008  . Coronary atherosclerosis of native coronary artery 12/19/2007  . Acute sinusitis 12/19/2007  . COPD mixed type ([place] 12/19/2007  . COLONIC POLYPS 12/18/2007  . HYPERCHOLESTEROLEMIA 12/18/2007  . Obesity 12/18/2007  . ANXIETY 12/18/2007  . Essential hypertension 12/18/2007  .  GASTRITIS 12/18/2007  . Impaired fasting glucose 12/18/2007    Ensley Blas L 101-17-2017 3:01 PM  [place]SSpart[address] [place] [address]Phone: [phone]  Fax:  [phone] Name: ETULLY MCINTURFFMRN: [id number] of Birth: [birth year]  JGeoffry Paradise PT,DPT 12017/01/173:02 PM Phone: [phone]: [phone]

## 2015-11-06 NOTE — Patient Outreach (Signed)
[facility] [facility]) Care Management  [place]   11/06/2015  FRIEDRICH HARRIOTT 1946-08-16 [id number]  Subjective: [name] is a 69yo who was referred to [place] for medication assistance with inhalers and smoking cessation.  Patient is currently in the donut hole.    I made outreach call to patient who reports he currently has all of his medications.  He reports he has been in the donut hole for several months, but he states his daughter was able to pick p his Advair and Spiriva inhalers on Tuesday.  Discussed with patient how the donut hole works and that he will be out of the donut hole beginning in January 2017.  Patient reports he has no trouble affording his medication copays when he is not in the donut hole.  Discussed patient assistance programs with the patient and explained to him that he can apply for assistance with his medications in the future if he goes in the donut hole again next year.  Patient voiced understanding.      Patient reports he has been tobacco free since he was admitted to the hospital on 10/20/15.  Patient reports he is not using any pharmacotherapy for tobacco cessation.  He has patches at home but has not been using them.    Objective:   Current Medications: Current Outpatient Prescriptions  Medication Sig Dispense Refill  . albuterol (PROVENTIL HFA;VENTOLIN HFA) 108 (90 BASE) MCG/ACT inhaler Inhale 2 puffs into the lungs 4 (four) times daily as needed for wheezing (for use when not at home / unable to use nebulizer). 1 Inhaler 0  . albuterol (PROVENTIL) (2.5 MG/3ML) 0.083% nebulizer solution Take 3 mLs (2.5 mg total) by nebulization 4 (four) times daily as needed for wheezing or shortness of breath. 75 mL 0  . ALPRAZolam (XANAX) 0.5 MG tablet TAKE ONE TABLET BY MOUTH THREE TIMES DAILY AS NEEDED (Patient taking differently: TAKE ONE TABLET BY MOUTH THREE TIMES DAILY AS NEEDED for anxiety or sleep) 90 tablet 2  . amLODipine (NORVASC)  10 MG tablet TAKE 1 TABLET (10 MG TOTAL) BY MOUTH DAILY. 90 tablet 4  . aspirin 81 MG tablet Take 81 mg by mouth at bedtime.     . Flaxseed, Linseed, (FLAXSEED OIL) 1000 MG CAPS Take 1 capsule by mouth daily.      . Fluticasone-Salmeterol (ADVAIR DISKUS) 250-50 MCG/DOSE AEPB Inhale 1 puff into the lungs every 12 (twelve) hours. 60 each 11  . ibuprofen (ADVIL,MOTRIN) 600 MG tablet Take 1 tablet (600 mg total) by mouth daily as needed. (Patient taking differently: Take 600 mg by mouth daily as needed for moderate pain. ) 30 tablet 11  . lisinopril (PRINIVIL,ZESTRIL) 10 MG tablet Take 1 tablet (10 mg total) by mouth daily. 30 tablet 0  . Multiple Vitamin (MULTIVITAMIN) capsule Take 1 capsule by mouth daily.      . Omega-3 Fatty Acids (FISH OIL) 1200 MG CAPS Take 1 capsule by mouth daily.      . predniSONE (DELTASONE) 20 MG tablet Take 2 tablets a day on 12/15 and 12/16, then one tablet a day for 6 days, then 1/2 (half) a tablet for 6 days, then stop 13 tablet 0  . simvastatin (ZOCOR) 40 MG tablet Take 1 tablet (40 mg total) by mouth daily at 6 PM. 30 tablet 0  . sodium chloride (OCEAN) 0.65 % SOLN nasal spray Place 1 spray into both nostrils as needed for congestion.  0  . tiotropium (SPIRIVA) 18 MCG  inhalation capsule Place 1 capsule (18 mcg total) into inhaler and inhale daily. 90 capsule 3  . nicotine (NICODERM CQ - DOSED IN MG/24 HOURS) 21 mg/24hr patch Place 1 patch (21 mg total) onto the skin daily. (Patient not taking: Reported on 11/06/2015) 28 patch 0   No current facility-administered medications for this visit.   Functional Status: In your present state of health, do you have any difficulty performing the following activities: 10/20/2015  Hearing? N  Vision? N  Difficulty concentrating or making decisions? N  Walking or climbing stairs? N  Dressing or bathing? N  Doing errands, shopping? N   Fall/Depression Screening: PHQ 2/9 Scores 06/05/2013  PHQ - 2 Score 0    Assessment: 1.   Medication assistance:  Patient reports he has all medications at this time.  Patient will be out of donut hole in January 2017 and patient reports he has no trouble affording his medication copays when he is not in the donut hole.    2.  Tobacco cessation:  Patient reports he is quit smoking and denies needing any assistance with smoking cessation.    3.  Medication review:   Drugs sorted by system:  Neurologic/Psychologic: alprazolam  Cardiovascular: amlodipine, aspirin, lisinopril, fish oil, simvastatin   Pulmonary/Allergy: albuterol HFA and nebulizer, Advair, prednisone, saline nasal spray, Spiriva  Gastrointestinal: none  Endocrine: none  Renal: none  Topical: nicotine patches (patient is not using)  Pain: ibuprofen  Vitamins/Minerals: multivitamin  Infectious Diseases: none  Miscellaneous: flaxseed oil   Duplications in therapy: none noted Gaps in therapy: none noted Medications to avoid in the elderly: alprazolam (increaesd risk of cognitive impairment, delirium, falls, fractures, and motor vehicle crashes in older adults); ibuprofen (increased risk of GI bleeding)  Drug interactions: simvastatin and amlodipine - plasma concentrations and pharmacologic effects of simvastatin may be increased by amlodipine; aspirin and ibuprofen - regular use of ibuprofen may decrease antiplatelet effects of aspirin Other issues noted: none noted  Plan:  1.  Medication review:  All medications appear appropriate based on patient's problem list.  Please use caution with medications on the beers list.  Of note, there is a drug interaction with simvastatin and amlodipine.  The daily dose of simvastatin should not exceed '20mg'$  in patients receiving amlodipine.  Would recommend dose reduction of simvastatin dose to '20mg'$  or switching to alternative statin.  Will send a fax to patient's primary care provider, Dr. Ardeth Perfect with this information.  There is also interaction between ibuprofen and  aspirin.  Patient's reports he does not take ibuprofen regularly.  I counseled patient to administer ibuprofen at least 8 hours before or 30 minutes after aspirin.  Patient voices understanding.   2.  Medication assistance:  Will send outreach letter to patient with my contact information should he need assistance in applying for patient assistance programs in the future.   3.  Tobacco cessation:  Encouraged patient to contact me if he needs assistance with smoking cessation in the future.   4.  Will close pharmacy program at this time as no further intervention needed.  Will alert [facility] Landis Martins of pharmacy program closure.    Elisabeth Most, Pharm.D. Pharmacy Resident [place] [phone]

## 2015-11-06 NOTE — Patient Outreach (Signed)
[place] [facility]) Care Management  11/06/2015  [name] [birth year] [id number]   Transition of care week 2  Telephone call to [name], doing well denies any increases in shortness of breath. Patient continues not to smoke. [name] has had follow up visit with his PCP as well as Dr.Nadel  Patient reports taking his medications without problems and has all inhalers for use Patient unable to set up home visit appointment at this time No new concern, reinforced to call MD with new symptoms, of increased shortness of breath,  Cough,sputum production.  Plan Continue to call with transition of care and schedule home visit at call on next week   [facility] CM Care Plan Problem One        Most Recent Value   Care Plan Problem One  Recent hospital Admission for COPD with acute exacerbation at risk    Role Documenting the Problem One  Care Management [place] for Problem One  Active   [facility] Long Term Goal (31-90 days)  Patient will not experience a hospital admission related to COPD in the next 60 days   [facility] Long Term Goal Start Date  10/30/15   Interventions for Problem One Long Term Goal  Discussed the importance of notifying MD of increased symptoms,and not getting relief with inhaler and nebulizer treatment to arrange early office visit   [facility] CM Short Term Goal #1 (0-30 days)  Patient will not experience a hospital admission in the next 30 days   [facility] CM Short Term Goal #1 Start Date  10/30/15   Interventions for Short Term Goal #1  Discuss COPD zones and actions to take if an acute episode occurs, reinforced importance of not smoking,importance of taking all medication as prescribed, and to notify MD of problems or concerns    [facility] CM Short Term Goal #2 (0-30 days)  Patient will  verbalize attending  follow up visit with PCP/ Specialist in 7 days of discharge   [facility] CM Short Term Goal #2 Start Date  10/30/15   [facility] CM Short Term Goal #2 Met Date  11/06/15      Joylene Draft, RN, [place] Management [phone]- Mobile [phone]- [place] .

## 2015-11-13 ENCOUNTER — Other Ambulatory Visit: Payer: Self-pay | Admitting: *Deleted

## 2015-11-13 NOTE — Patient Outreach (Signed)
[facility] [facility]) Care Management  11/13/2015  [name] [birth year] [id number]  Transition of care week 3 Spoke with [name] by telephone, reports that he is doing "Pretty good", he has moved from his daughter's home back to his home with his wife on this week, voiced being happy at being back at home and getting into his own routine.  [name] has attended session at outpatient neuro rehab- and stated his is not sure why he went there, he thought that he was to be going to pulmonary rehab,"I think that would be the best thing for me"  [name] continues to wear his oxygen at 1 liter when resting and up to 2 liters with activity, he continues not to smoke and reports that he is taking his medication as prescribed. Patient denies any episodes of increased shortness of breath or  sputum production . Patient voiced understanding to call MD for increased shortness of breath, patient is able to state actions that he is to take for symptoms.  Plan Discuss with PCP office patient request for Pulmonary Rehab consult,- spoke with Amy from the office request had already been sent - anticipate patient will be contacted on next week, I  updated [name].  Patient will attend follow up appointment with Dr.Nadel on next week, and Cardiology Continue transition of care call on next week and Initial home visit on January 9.  Joylene Draft, RN, [place] Management [phone]- Mobile [phone]- Toll Free Main Office  Joylene Draft, [place], [place] Management [phone]- Mobile [phone]- [place] Office

## 2015-11-14 ENCOUNTER — Telehealth: Payer: Self-pay | Admitting: Pulmonary Disease

## 2015-11-14 ENCOUNTER — Ambulatory Visit: Payer: Medicare Other | Admitting: Occupational Therapy

## 2015-11-14 MED ORDER — ALBUTEROL SULFATE (2.5 MG/3ML) 0.083% IN NEBU: 2.5000 mg | INHALATION_SOLUTION | Freq: Three times a day (TID) | RESPIRATORY_TRACT | Status: DC

## 2015-11-14 NOTE — Telephone Encounter (Signed)
Called # provided and line busy x 3 wcb

## 2015-11-14 NOTE — Telephone Encounter (Signed)
Called spoke with pt. Aware RX for albuterol neb has been sent in. Nothing further needed

## 2015-11-18 NOTE — Progress Notes (Signed)
Cardiology Office Note    Date:  11/19/2015   ID:  [name], DOB 19-Dec-1945, MRN [id number]  PCP:  [name], MD  Cardiologist:  Dr. Sherren Mocha   Electrophysiologist:  n/a  Chief Complaint  Patient presents with  . Follow-up  . Coronary Artery Disease  . Hospitalization Follow-up    admx with COPD exacerbation 10/2015    History of Present Illness:  [name] is a 70 y.o. male with a hx of CAD status post remote inferior MI in 1995. He has undergone multiple PCI procedures. Most recent PCI was in 2009 (DES 3, overlapping in the RCA). Other history includes COPD, tobacco abuse, HTN, HL, bladder CA. Last seen by Dr. Burt Knack 4/15.  He was admitted 12/5-12/14 with hypoxic respiratory failure secondary to AECOPD.  He returns for follow-up. Overall, he is feeling better. Breathing is improved. He is now on O2. He has stopped smoking. Denies chest discomfort, syncope, orthopnea, PND. He did have some LE edema. He tells me he received Lasix in the hospital. Edema has resolved.   Past Medical History  Diagnosis Date  . History of sinusitis   . Epistaxis   . COPD (chronic obstructive pulmonary disease) ([place])   . Cigarette smoker   . Hypertension   . CAD (coronary artery disease)   . Hypercholesteremia   . Borderline diabetes mellitus   . Obesity   . History of colonic polyps 2004    hyperplastic   . DJD (degenerative joint disease)   . Anxiety   . Bladder cancer ([place]) 2011    Past Surgical History  Procedure Laterality Date  . Cataract surgery/sub posterior vitrectomy in left eye  1996    Dr. Zadie Rhine  . Cataract surgery  septemeber 2013  . [facility]  [phone]    Current Outpatient Prescriptions  Medication Sig Dispense Refill  . albuterol (PROVENTIL HFA;VENTOLIN HFA) 108 (90 BASE) MCG/ACT inhaler Inhale 2 puffs into the lungs 4 (four) times daily as needed for wheezing (for use when not at home / unable to use nebulizer). 1 Inhaler 0  . albuterol  (PROVENTIL) (2.5 MG/3ML) 0.083% nebulizer solution Take 3 mLs (2.5 mg total) by nebulization 3 (three) times daily. 360 mL 3  . ALPRAZolam (XANAX) 0.5 MG tablet Take 0.5 mg by mouth 3 (three) times daily as needed for anxiety or sleep.    [name] amLODipine (NORVASC) 10 MG tablet TAKE 1 TABLET (10 MG TOTAL) BY MOUTH DAILY. 90 tablet 4  . aspirin 81 MG tablet Take 81 mg by mouth at bedtime.     . Flaxseed, Linseed, (FLAXSEED OIL) 1000 MG CAPS Take 1 capsule by mouth daily.      . Fluticasone-Salmeterol (ADVAIR DISKUS) 250-50 MCG/DOSE AEPB Inhale 1 puff into the lungs every 12 (twelve) hours. 60 each 11  . ibuprofen (ADVIL,MOTRIN) 600 MG tablet Take 600 mg by mouth every 6 (six) hours as needed for moderate pain.    [name] lisinopril (PRINIVIL,ZESTRIL) 10 MG tablet Take 1 tablet (10 mg total) by mouth daily. 30 tablet 0  . Multiple Vitamin (MULTIVITAMIN) capsule Take 1 capsule by mouth daily.      . Omega-3 Fatty Acids (FISH OIL) 1200 MG CAPS Take 1 capsule by mouth daily.      . simvastatin (ZOCOR) 20 MG tablet Take 1 tablet (20 mg total) by mouth daily at 6 PM. 30 tablet 2  . sodium chloride (OCEAN) 0.65 % SOLN nasal spray Place 1 spray into both nostrils as  needed for congestion.  0  . tiotropium (SPIRIVA) 18 MCG inhalation capsule Place 1 capsule (18 mcg total) into inhaler and inhale daily. 90 capsule 3   No current facility-administered medications for this visit.    Allergies:   Dextromethorphan-guaifenesin   Social History   Social History  . Marital Status: Married    Spouse Name: [name]  . Number of Children: 2  . Years of Education: N/A   Occupational History  . Retired    Social History Main Topics  . Smoking status: Former Smoker -- 0.50 packs/day for 45 years    Types: Cigarettes    Quit date: 10/19/2013  . Smokeless tobacco: Never Used  . Alcohol Use: 0.0 oz/week    0 Standard drinks or equivalent per week     Comment: glass of wine at night  . Drug Use: No  . Sexual Activity:  Not Asked   Other Topics Concern  . None   Social History Narrative     Family History:  The patient's family history includes Aneurysm in his brother; Cancer in his brother; Heart disease in his brother and mother.   ROS:   Please see the history of present illness.    Review of Systems  Constitution: Positive for decreased appetite.  Cardiovascular: Positive for dyspnea on exertion and leg swelling.  Respiratory: Positive for shortness of breath.   All other systems reviewed and are negative.   PHYSICAL EXAM:   VS:  BP 112/66 mmHg  Pulse 58  Ht '5\' 6"'$  (1.676 m)  Wt 217 lb 12.8 oz (98.793 kg)  BMI 35.17 kg/m2   GEN: Well nourished, well developed, in no acute distress HEENT: normal Neck: no JVD, no masses Cardiac: Normal S1/S2, RRR; no murmurs, rubs, or gallops, no edema;     Respiratory:  Decreased breath soundsbilaterally; no wheezing, rhonchi or rales GI: soft, nontender, nondistended, + BS MS: no deformity or atrophy Skin: warm and dry, no rash Neuro:  Bilateral strength equal, no focal deficits  Psych: Alert and oriented x 3, normal affect  Wt Readings from Last 3 Encounters:  11/19/15 217 lb 12.8 oz (98.793 kg)  10/31/15 210 lb 6.4 oz (95.437 kg)  10/29/15 202 lb 1.6 oz (91.672 kg)      Studies/Labs Reviewed:   EKG:  EKG is  ordered today.  The ekg ordered today demonstrates  NSR, HR 73, normal axis, QTC 398 ms, no change since prior tracing   Recent Labs: 10/20/2015: B Natriuretic Peptide 498.4* 10/22/2015: Magnesium 2.2 10/23/2015: ALT 26; Hemoglobin 14.3; Platelets 166 10/27/2015: BUN 23*; Creatinine, Ser 1.10; Potassium 4.5; Sodium 135   Recent Lipid Panel    Component Value Date/Time   CHOL 138 10/22/2015 0309   TRIG 75 10/22/2015 0309   HDL 25* 10/22/2015 0309   CHOLHDL 5.5 10/22/2015 0309   VLDL 15 10/22/2015 0309   LDLCALC 98 10/22/2015 0309   LDLDIRECT 88.7 03/30/2010 0948    Additional studies/ records that were reviewed today include:    Echo 10/21/15 Mild LVH, EF 55-60%, no RWMA, MAC, mod LAE, mod to severe RAE, mod TR, PASP 51 mmHg  Myoview 2/13 Low risk stress nuclear study. Small inferior wall infarct from apex to base with no ischemia. EF preserved 58%  PCI 4/08 [place] DES x 3 to RCA  [facility] 3/08 LAD prox 30%, mid 40% OM 40% RCA prox 90% ISR, then 80% ISR, mid 80% ISR, dist 90% EF 55%   ASSESSMENT:    1. Coronary  artery disease involving native coronary artery of native heart without angina pectoris   2. Essential hypertension   3. Hyperlipidemia   4. COPD mixed type ([place])     PLAN:  In order of problems listed above:  1. CAD - Hx of remote MI and multiple PCI procedures.  Last PCI in 2009 with DES x 3 to RCA.  Myoview in 2013 was low risk.  He denies any symptoms of angina. Continue aspirin, amlodipine, statin.  2. HTN -  Controlled.  Continue current Rx.   3. HL - Managed by PCP.  He was changed to Simvastatin 40 in the hospital. Max dose of Simvastatin with concomitant Amlodipine Rx is 20 mg.  Decrease Simvastatin to 20 mg QHS.  FU with PCP for repeat labs in 2-3 mos.  4. COPD - Recent admit for AECOPD. He has already FU with Pulmonology.  He has quit smoking.    Medication Adjustments/Labs and Tests Ordered: Current medicines are reviewed at length with the patient today.  Concerns regarding medicines are outlined above.  Medication changes, Labs and Tests ordered today are listed below. Patient Instructions  Medication Instructions:  1. DECREASE SIMVASTATIN TO 20 MG DAILY; NEW RX SENT IN  Labwork: FOLLOW UP WITH PRIMARY CARE IN REGARDS TO NEEDING FASTING LAB WORK FOR YOUR CHOLESTEROL TO BE DONE IN ABOUT 3 MONTHS; SINCE [place]zar [place], PAC HAS DECREASED SIMVASTATIN TODAY;   Testing/Procedures: NONE  Follow-Up: Your physician wants you to follow-up in: [place]. Burt Knack. You will receive a reminder letter in the mail two months in advance. If you don't receive a letter, please call our  office to schedule the follow-up appointment.   Any Other Special Instructions Will Be Listed Below (If Applicable).     If you need a refill on your cardiac medications before your next appointment, please call your pharmacy.    Signed, Richardson Dopp, PA-C  11/19/2015 9:56 AM    [facility] [address], [place], [place]  [address] Phone: [phone]; Fax: [phone]

## 2015-11-19 ENCOUNTER — Other Ambulatory Visit: Payer: Self-pay | Admitting: *Deleted

## 2015-11-19 ENCOUNTER — Ambulatory Visit (INDEPENDENT_AMBULATORY_CARE_PROVIDER_SITE_OTHER): Payer: Medicare Other | Admitting: Physician Assistant

## 2015-11-19 ENCOUNTER — Encounter: Payer: Self-pay | Admitting: Physician Assistant

## 2015-11-19 VITALS — BP 112/66 | HR 58 | Ht 66.0 in | Wt 217.8 lb

## 2015-11-19 DIAGNOSIS — I10 Essential (primary) hypertension: Secondary | ICD-10-CM | POA: Diagnosis not present

## 2015-11-19 DIAGNOSIS — E785 Hyperlipidemia, unspecified: Secondary | ICD-10-CM

## 2015-11-19 DIAGNOSIS — I2510 Atherosclerotic heart disease of native coronary artery without angina pectoris: Secondary | ICD-10-CM | POA: Diagnosis not present

## 2015-11-19 DIAGNOSIS — J449 Chronic obstructive pulmonary disease, unspecified: Secondary | ICD-10-CM

## 2015-11-19 MED ORDER — NITROGLYCERIN 0.4 MG SL SUBL: 0.4000 mg | SUBLINGUAL_TABLET | SUBLINGUAL | Status: AC | PRN

## 2015-11-19 MED ORDER — SIMVASTATIN 20 MG PO TABS: 20.0000 mg | ORAL_TABLET | Freq: Every day | ORAL | Status: AC

## 2015-11-19 NOTE — Addendum Note (Signed)
Addended by: Michae Kava on: 11/19/2015 10:00 AM   Modules accepted: Orders

## 2015-11-19 NOTE — Patient Outreach (Signed)
[facility]) Care Management  11/19/2015  [name] Jan 16, 1946 [id number]   Transition of care week 4   AndrewKim report that he is managing will at home, taking medications as ordered, still wearing home O2 at 1 to 2 liter, not smoking. Patient denies increase in shortness of breath episode. Patient anticipating being able to start pulmonary rehab, states he has an appointment with Dr.Nadel on Friday.  Patient voiced no further needs or concerns at this time  Plan Home visit on Monday, January 9.  Joylene Draft, RN, [place] Management [phone]- Mobile [phone]- Toll Free Main Office

## 2015-11-19 NOTE — Patient Instructions (Addendum)
Medication Instructions:  1. DECREASE SIMVASTATIN TO 20 MG DAILY; NEW RX SENT IN  2. A REFILL FOR NTG HAS BEEN SENT IN  Labwork: FOLLOW UP WITH PRIMARY CARE IN REGARDS TO NEEDING FASTING LAB WORK FOR YOUR CHOLESTEROL TO BE DONE IN ABOUT 3 MONTHS; SINCE [place] WEAVER, PAC HAS DECREASED SIMVASTATIN TODAY;   Testing/Procedures: NONE  Follow-Up: Your physician wants you to follow-up in: [place]. Burt Knack. You will receive a reminder letter in the mail two months in advance. If you don't receive a letter, please call our office to schedule the follow-up appointment.   Any Other Special Instructions Will Be Listed Below (If Applicable).     If you need a refill on your cardiac medications before your next appointment, please call your pharmacy.

## 2015-11-21 ENCOUNTER — Encounter: Payer: Self-pay | Admitting: Pulmonary Disease

## 2015-11-21 ENCOUNTER — Ambulatory Visit (INDEPENDENT_AMBULATORY_CARE_PROVIDER_SITE_OTHER): Payer: Medicare Other | Admitting: Pulmonary Disease

## 2015-11-21 VITALS — BP 130/72 | HR 70 | Temp 98.1°F | Ht 66.0 in | Wt 216.0 lb

## 2015-11-21 DIAGNOSIS — Z87891 Personal history of nicotine dependence: Secondary | ICD-10-CM

## 2015-11-21 DIAGNOSIS — J9611 Chronic respiratory failure with hypoxia: Secondary | ICD-10-CM | POA: Diagnosis not present

## 2015-11-21 DIAGNOSIS — J449 Chronic obstructive pulmonary disease, unspecified: Secondary | ICD-10-CM

## 2015-11-21 DIAGNOSIS — I10 Essential (primary) hypertension: Secondary | ICD-10-CM | POA: Diagnosis not present

## 2015-11-21 DIAGNOSIS — I2510 Atherosclerotic heart disease of native coronary artery without angina pectoris: Secondary | ICD-10-CM

## 2015-11-21 DIAGNOSIS — I272 Other secondary pulmonary hypertension: Secondary | ICD-10-CM

## 2015-11-21 DIAGNOSIS — I420 Dilated cardiomyopathy: Secondary | ICD-10-CM

## 2015-11-21 MED ORDER — AMLODIPINE BESYLATE 10 MG PO TABS: ORAL_TABLET | ORAL | Status: DC

## 2015-11-21 NOTE — Patient Instructions (Signed)
Today we updated your med list in our EPIC system...    Continue your current medications the same...  Continue your NEB treatments 3 times daily, followed by the [place] as we've discussed...  We will make a referral to PULMONARY REHAB at [facility]...  We will place a request w/ [facility] for a portable oxygen concentrator to provide for longer time out & aabout...  Stay as active as poss (and as directed by the rehab team), AND get on that weight reducing diet....  Call for any questions...  Let's plan a follow up visit in 6 weeks, sooner if needed for problems.Marland KitchenMarland Kitchen

## 2015-11-23 ENCOUNTER — Encounter: Payer: Self-pay | Admitting: Pulmonary Disease

## 2015-11-23 NOTE — Progress Notes (Signed)
Subjective:    Patient ID: [name], male    DOB: 27-Dec-1945, 70 y.o.   MRN: [id number]  HPI 70 y/o WM here for a follow up visit and on-going management of mult med problems...  ~  SEE PREV EPIC NOTES FOR OLDER DATA >>    CXR 7/14 showed COPD/ emphysema, incr markings and scarring at the bases, NAD...  LABS 7/14:  FLP- ok on Simva20;  Chems- ok x BS=120 A1c=6.7;  CBC- wnl;  TSH=1.26;  PSA=0.41...  [name] has established Primary [name] follow up w/ [name] at [facility]>>   CXR 7/15 showed underlying COPD, mild bibasilar fibrosis, norm heart size, DJD in spine, NAD...   PFT 7/15 showed> FVC=3.18 (78%), FEV1=1.22 (38%), %1sec=38; mid-flows=13% predicted... This is c/w GOLD Stage3 COPD and we discussed this...  ~  December 05, 2014:  73moROV & pulmonary recheck; EVencenthas continued smoking, perhaps decr slightly to 1/2ppd but unable to quit & he has gained 6# in the interim up to 221# today;  His CC is sinusitis w/ nasal congestion, drainage & green mucus plus maxillary/ frontal HAs; he denies f/c/s, notes stable cough & chest symptoms w/ DOE;  He has been taking his Advair250Bid but prev refused addition of an anticholinergic due to "the donut hole";  States his breathing is "fine, except in cold air" ( we discussed wearing a scarf in winter etc)... He has GOLD Stage3 COPD w/ FEV1=1.22 (38%predicted) done 7/15;;  Exam shows decr BS bilat w/ few scat rhonchi & mild exp wheezing esp w/ forced maneuver...     We reviewed prob list, meds, xrays and labs> see below for updates >> he is followed by [name] and sees him once per year... PLAN>> we discussed treating his acute max sinusitis w/ Augmentin875Bid, plus align daily & nasal saline as needed; he knows the importance of smoking cessation but is not motivated & declines smoking cessation help; he will continue the Advair250Bid 7 we are adding spiriva via handihaler once per day; we plan ROV in 617mo/ f/u CXR & PFT,  call in the interim for any breathing issues....   ~  June 05, 2015:  37m75moV & ErnDonielports a good interval, no new complaints or concerns; still smoking +/-1ppd and blames family stress; notes cough, sm amy yellow sput, no hemoptysis, SOB w/o change/stable- still mows 3 yards, does chores, tends to 20+chickens & their eggs, etc; he has repeatedly declined smoking cessation help, clearly not motivated to quit, not even doing his inhalers regularly!!!    COPD, +smoker> on Advair250Bid & Spiriva daily; symptoms as above, he is stoic, still smoking 1ppd & NOT motivated to quit, asked to cut back & use both inhalers regularly...    HBP> on Metop25Bid, Amlod10, Lisin40; BP=114/64 & he denies CP, palpit, SOB, edema...    CAD> on ASA81; followed by [name] & seen 4/15 (overdue for f/u)> CAD, IWMI 1995, mult PCIs, last Myoview 2013 was low risk; too sedentary, no changes made; 73 55o brother died w/ AAA rupture...    Chol> on Simva20; FLP 7/14 showed TChol 158, TG 145, HDL 33, LDL 96; needs better diet & wt reduction and ret for Fasting blood work overdue.    DM> on diet alone; LABS 7/14 showed BS= 120, A1c= 6.7; we reviewed low carb diet, exercise program, and wt reduction...    Obese> wt recorded as 204# today but it doesn't look like he's lost 26#; we reviewed diet &  exercise...    GI- Gastritis, polyps> prev on Zantac; prev colon 2004 w/ hyperplastic polyp removed & f/u 8/14 by [name] w/ 3 polyps removed (5-60m size, all were tubular adenomas), +divertics in sigmoid, f/u planned 5 yrs... NOTE> exam showed a small palp knot in mid-abd above the umbilicus ?etiology (I offered CT Abd but he declines & wants to discuss w/ his primary- [name]).    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    DJD> on OTC analgesics  as needed...    Anxiety> on Alpraz0.18m57mrn... We reviewed prob list, meds, xrays and labs> see below for updates >>  NOTE> HE IS FOLLOWED BY [name] for GEN MED/ PCP now...  CXR 06/05/15 showed stable heart size, Ao atherosclerosis & tortuousiy, COPD/emphysema, incr markings at the bases, arthritis in Tspine...  LABS 7/16: pt asked to ret for fasting blood work... IMP/PLAN>>  [name] he is stable but continues to smoke 1ppd; CXR w/ COPD/emphysema, atherosclerotic Ao, chronic changes but NAD- we discussed the low-dose screening CT Chest for the early detection of lung cancer (he is a candidate & he will consider participating in this program); in the meanwhile- continue Advair/ Spiriva regularly;  He is overdue for Cards f/u w/ [name], and needs to continue Q6mo29mo w/ [name] (w/ copy of notes to us);[place] will ret for FASTING blood work.  ADDENDUM>> given his age and smoking history, [name] candidate for LUNG CANCER SCREENING w/ LOW-DOSE CT Chest>>  This office visit constitutes a face-to-face visit for the purpose of lung cancer screening counseling and a shared decision making visit...  Eligibility for LDCT screening:  1) Age=70 (must be 55-77y/o);  2) Absence of symptoms of lung cancer: he denies CP, ch in SOB, hemoptysis.  Smoking History:  1) He has 60+ pk-yrs (must have >30 pk-yr hx smoking);  2) Smoking status- still smoking ~1ppd (current or former smoker who quit<15 yrs ago).  Counseling:  Importance of smoking cessation &/or continued abstinence discussed ; offered smoking cessation interventions- pt declined; pt agreed to continued LDCT screening annually.  Shared decision making:  We reviewed the potential benefits and harms of screening> eg. Early detection of lung cancer and improved survival, the need for additional diagnostic tests & possible surgery, the risk of over-diagnosis/ false positives/ and radiation exposure... All questions were answered to the best  of my ability & he would like to consider the LDCT screening & he will let us kKoreaw his decision=> he ignored this discussion & never called.  ~  October 31, 2015:  5 month ROV & post [name] follow up visit>  [name] was Adm 12/5 - 10/29/15 by [facility] w/ acute hypoxemic resp failure precipitated by a COPD exac; for his part he noted incr SOB, cough, green sput, & "slowing down", he denied f/c/s, no CP, etc;  In the ER he was hypoxemic w/ O2sats in the 70s on RA and eval revealed> CXR- COPD, bullous dis, incr markings at bases, NAD; Cultures of blood/ sput/ urine were all neg; Serologies for strep & legionella were neg; Viral panel was neg;  LABs were OK (WBC=7.9=>14, Hg=15.3=>13.9, BS=100-140, BNP=498);  EKG showed NSR, rate78, NSSTTWA;  2DEcho 10/21/15 showed mildLVH, norm LVF w/ EF=55-60%, no regional wall motion abn, LA-mod  dil, RA- mod to severe dil, RV- mod dil, mod TR, PAsys-25mHg... He was treated w/ Oxygen, IV Solumedrol, Rocephin/ Zithromax, NEBS, Lovenox, flutter, etc; he was disch on Home O2- 2L/min activ & 1L/min rest, Pred taper, Levaquin, Advair250/ Spiriva...  He returns today feeling better, notes his home pulse-ox checks are improving & he has less SOB/DOE, less cough/sput, etc;  He says he has not smoked since PTA...      EXAM shows Afeb, VSS, O2sat=94% on 2L/min [name];  Wt=210# (he was 204 prev);  HEENT- neg, mallampati1;  Chest- decr BS bilat, few scat rhonchi, no w/r/consolidation;  Heart- RR w/o m/r/g;  Abd- obese, soft;  Ext- neg w/o c/c/e...  IMP/PLAN>>  COPD/emphysema, GOLD Stage 3, chronic hypoxemic resp failure- on Home O2 now, exsmoker- quit 10/2015, pulmonary HTN (PAsys by [name]- WHO group 3>  I congratulated him on not smoking & we discussed continuing the Oxygen regularly (esp due to the PKindred [name] [place], Prednisone taper, Advair, Spiriva, NEBS w/ Albut, Mucinex/flutter;  He will follow up in 3 wks when the Pred has been tapered & we briefly discussed the need for Full PFTs and CT Chest later...    ~  November 21, 2015:  3wk ROV & [name] reports that he feels much better, SOB improved, cough improved, still prod of a sm amt green sput w/o blood w/o f/c/s;  He's been using the NEB w/ Albut TID followed by his AdvairBid & SpirivaQd; he is off the Pred now, he is too sedentary & not exercising => he needs PULM REHAB & we will refer, [facility] will inquire as to what he might be elig for...       EXAM shows Afeb, VSS, O2sat=98% on 2L/min [place];  Wt=216# (he was 204-210# prev);  HEENT- neg, mallampati2;  Chest- decr BS bilat, otherw clear w/o w/r/r;  Heart- RR w/o m/r/g;  Abd- obese, soft;  Ext- neg w/o c/c/e...  IMP/PLAN>>  COPD/emphysema, GOLD Stage 3, chronic hypoxemic resp failure- on Home O2, exsmoker- quit 10/2015, pulmonary HTN (PAsys by [name]- WHO group 3> referred for PulmRehab, contact [facility] re- POC, he still needs Full PFTs & CT Chest... We plan ROV in 6 wks.          Problem List:  Hx of SINUSITIS (ICD-473.9) - off Nasonex now & recent Rx w/ Augmentin/ Saline... notes occas sinus congestion, drainage, sl HA, etc... he knows that he needs to quit smoking! ~  10/10: Adm for epistaxis w/ eval by DrWolicki- CXR= NAD, labs OK, rec for saline lavage- nose bleed resolved & disch home (no recurrence so far). ~  1/16: he presented w/ acute max & frontal sinusitis> Rx w/ augmentin, Align, Saline, Mucinex, etc...  COPD (ICD-496) - on ADVAIR 250Bid; STILL SMOKES CIGARETTES & he's refused Chantix...  ~  He had hemoptysis 6/99 without lesion on CXR, CTChest, or Bronchoscopy... ~  Adm 10/10 w/ epistaxsis & eval DrWolicki as noted> he wanted to do Sleep Study but pt cancelled it & declined to resched. ~  CXR 5/11 showed COPD/ bullous emphysema, NAD..[name] ~  CXR 5/12 showed ectatic calcif & tort Aorta, COPD w/ incr markings ar bases, NAD, +degen spondylosis. ~  CXR 6/13 showed COPD/ emphysema, scarring in the lower lobes, otherw clear lungs, DJD in spine... ~  CXR 7/14 showed COPD/  emphysema, incr markings and scarring at the bases, NAD. ~  1/15: [name] he's decr his smoking from prev 3-4ppd down to 3 cig/d;  He remains on Advair250> states breathing is ok, at baseline CAT score=12; baseline cough, sput, no hemoptysis, stable DOE, no edema... ~  CXR 7/15 showed underlying COPD, mild bibasilar fibrosis, norm heart size, DJD in spine, NAD...  ~  PFT 7/15 showed> FVC=3.18 (78%), FEV1=1.22 (38%), %1sec=38; mid-flows=13% predicted... This is c/w GOLD Stage3 COPD and we discussed this> He is rec to quit all smoking immediately; he has been using the nicotine gum & rec to try patches, offered Wellbutrin, he refuses Chantix; needs to continue ADVAIR250Bid & samples givem; he declines to start [place] at this time due to cost & donut hole (we will address this again on return); reminded to use MUCINEX '600mg'$ - 1to2 Bid w/ fluids, and OK to use OTC DELSYM as needed for cough...   ~  1/16: he claims stable on Advair250 but still smoking & can't vs won't quit; we decided to add Spiriva daily via handihaler... 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 ~  12/16:  He was [facility] for 9d w/ hypoxemic resp failure, COPD exac, and found to have pulmHTN w/ 2DEcho showing PAsys=51;  Treated w/ O2, Pred taper, antibiotics, NEBS, Advair/Spiriva, etc;  He says he has quit smoking... ~  [facility] 10/2015> CXR- COPD, bullous dis, incr markings at bases, NAD; Cultures of blood/ sput/ urine were all neg; Serologies for strep & legionella were neg; Viral panel was neg; He was treated w/ Oxygen, IV Solumedrol, Rocephin/ Zithromax, NEBS, Lovenox, flutter, etc; He was disch on Home O2- 2L/min activ & 1L/min rest, Pred taper, NEBS, Levaquin, Advair250/  Spiriva => improved.   HE HAS ESTABLISHED PRIMARY [name] w/ [name], [facility]>>   HYPERTENSION (ICD-401.9) - controlled on METOPROLOL '25mg'$ Tid,  NORVASC '10mg'$ /d, LISINOPRIL '40mg'$ /d...   ~  11/11:  BP=122/70, not checking BP's at home... denies HA, fatigue, visual changes, CP, palipit, dizziness, syncope, dyspnea, edema, etc. ~  5/12:  BP= 120/72, pt very pleased w/ his wt loss & improved exercise ability. ~  6/13:  BP= 122/64 & feeling well w/o CP, palpit, SOB, edema, etc... ~  12/13:  BP=122/82 & he denies CP, palpit, SOB, edema... ~  7/14: on Metop25-3/d, Amlod10, Lisin40; BP=130/78 & he denies CP, palpit, SOB, edema ~  1/15: BP remains stable on Metop25-2AM & 1PM, Amlod5, Lisin40; BP= 120/72 and he denies CP, palpit, ch in SOB, edema. ~  2016> His meds have been adjusted- now on Rx w/ Amlod10, Lisin10; BP= 130/72 7 he denies CP, palpit, edema; chr SOB/DOE due to his COPD.  CORONARY ARTERY DISEASE (ICD-414.00) - ASA '81mg'$ /d & off Plavix as of 2/13 officially as he wasn't taking it regularly anyway. ~  Hx prev IWMI and PTCA/ stent in RCA by DrBrodie 12/95 & 5/96...  ~  Myoview 4/08 abnormal w/ inferior ischemia and subseq cath revealing restenoses in RCA- s/p repeat PTCA/ stents...  ~  f/u Myoview 9/08 w/ prev infer infarct, no ischemia, EF=56%... ~  9/11:  pre op cardiac eval DrBrodie> OK to hold ASA/ Plavix for bladder surg. ~  2/12: f/u [name] w/ hx CAD, MI in 95, mult PCIs last 2009> stable, on ASA/ Plavix, no changes made; EKG= NSR, WNL... ~  2/13:  He had f/u  [name] w/ Myoview showing a fixed defect in infer wall, no ischemia, EF=58%; pt wasn't taking Plavix, therefore stopped in favor of ASA'81mg'$ /d... ~  3/14:  He had f/u [name]> CAD, IWMI 1995, mult PCIs, last Myoview 2013 was low risk; too sedentary, no changes made; 56 y/o brother died recently w/ AAA rupture... ~  EKG 3/14 showed NSR, rate65, WNL, NAD... ~  He maintains f/u w/ CARDS, [name]- known CAD, Hx inferior MI  1995, s/p mult PCIs- most recent 2009; finally qit smoking 12/16 after [name] for AECOPD w/ hypoxemia; on ASA, statin, Amlod & Lisin.  HYPERCHOLESTEROLEMIA (Mixed Hyperlipidemia) - prev on Simva80 but DrBrodie decr him to SIMVASTATIN '20mg'$ /d + Fish Oil & Red Wine qd... ~  [place] 9/08 on Simva40 showed TChol 151, TG 106, HDL 23, LDL 107 ~  FLP 8/09 on Simva80 showed TChol 142, TG 135, HDL 28, LDL 87 ~  FLP 6/10 on Simva80 showed TChol 151, TG 117, HDL 31, LDL 97 ~  FLP in hosp 10/10 showed TChol 128, TG 110, HDL 26, LDL 80 ~  FLP 5/11 on Simva80 showed TChol 144, TG 215, HDL 26, LDL 89 ~  9/11:  DrBrodie decr him to Simva20... we discussed the need for better diet & wt reduction. ~  FLP 5/12 on Simva20 showed TChol 162, TG 159, HDL 32, LDL 98 ~  FLP 6/13 on Simva20 showed TChol 130, TG 102, HDL 33, LDL 77... rec to incr exercise program & get wt down! ~  FLP 7/14 on Simva20 showed TChol 158, TG 145, HDL 33, LDL 96... He has gained way too much wt 7 must get on diet! ~  Followed by PCP- [name]...  DIABETES MELLITUS, BORDERLINE (ICD-790.29) - on diet alone... ~  labs 9/08 showed BS=100, HgA1c=6.1 ~  labs 8/09 showed BS= 107, A1c= 6.2 ~  labs 6/10 showed BS= 108, A1c= 6.2 ~  lab 5/11 (wt=244#) showed BS= 121. A1c= 6.7.[name]. he was told "get wt down, or start meds". ~  Labs 5/12 showed BS= 109, A1c= 6.1 ~  Labs 6/13 showed BS= 114, & he needs to lose the weight... ~  Labs 7/14 on diet alone showed BS= 120, A1c= 6.7 ~  Followed by PCP- [name]...  OBESITY (ICD-278.00) - weight was down as low as 202# in 2/09, and steadily incr to 244# by 5/11... ~  weight 5/11 = 244#.[name].  we reviewed diet + exercise necessary to lose weight... ~  weight 11/11 = 217# (after dx & rx for bladder cancer) ~  Weight 5/12 = 214# but says it's 192# at home... ~  Weight 6/13 = 211# but knows it's better since his pant/ belt is down to 36" (under his roll). ~  Weight 7/14 = 230# but he has quit smoking he says, now must  lose the weight... ~  Weight 1/15 = 229#  GASTRITIS (ICD-535.50) - had EGD 10/05 showing gastritis... on RANITADINE '300mg'$ /d due to his Plavix Rx, off prev Omep.  COLONIC POLYPS (ICD-211.3) - last colonoscopy by [name] 3/04 showed 74m polyp = hyperplastic w/ f/u planned 141yr. 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 ~  4/12:  Pt reported f/u cysto was neg, doing well... ~  10/12:  Note  from [name] reviewed> cysto was neg, no recurrent tumors... ~  4/13:  Note from [name] reviewed> neg f/u cysto w/o recurrent TCCa... He wants to continue Q49mocystoscopies... ~  1/14:  He had f/u w/ [name]> Hx TCCa bladder, s/p TURBT 9/11, given BCG 11/11-12/11 and f/u Cysto 1/12 was neg; repeat cysto 1/14 was neg as well...  DEGENERATIVE JOINT DISEASE (ICD-715.90) - He has c/o right shoulder pain, hip pain, knee pain, and LBP> Rx ADVIL w/ some benefit... offered Ortho referral & he will decide.  ANXIETY (ICD-300.00) - on ALPRAZOLAM 0.'5mg'$  Prn...   Past Surgical History  Procedure Laterality Date  . Cataract surgery/sub posterior vitrectomy in left eye  1996    Dr. RZadie Rhine . Cataract surgery  septemeber 2013  . Ptca  [phone]   Outpatient Encounter Prescriptions as of 11/21/2015  Medication Sig  . albuterol (PROVENTIL HFA;VENTOLIN HFA) 108 (90 BASE) MCG/ACT inhaler Inhale 2 puffs into the lungs 4 (four) times daily as needed for wheezing (for use when not at home / unable to use nebulizer).  .Marland Kitchenalbuterol (PROVENTIL) (2.5 MG/3ML) 0.083% nebulizer solution Take 3 mLs (2.5 mg total) by nebulization 3 (three) times daily.  .Marland KitchenALPRAZolam (XANAX) 0.5 MG tablet Take 0.5 mg by mouth 3 (three) times daily as needed for anxiety or sleep.  .[name] amLODipine (NORVASC) 10 MG tablet TAKE 1 TABLET (10 MG TOTAL) BY MOUTH DAILY.  .Marland Kitchenaspirin 81 MG tablet Take 81 mg by mouth at bedtime.   . Flaxseed, Linseed, (FLAXSEED OIL) 1000 MG CAPS Take 1 capsule by mouth daily.    . Fluticasone-Salmeterol (ADVAIR DISKUS) 250-50 MCG/DOSE AEPB Inhale 1 puff into the lungs every 12 (twelve) hours.  .Marland Kitchenibuprofen (ADVIL,MOTRIN) 600 MG tablet Take 600 mg by mouth every 6 (six) hours as needed for moderate pain.  .Marland Kitchenlisinopril (PRINIVIL,ZESTRIL) 10 MG tablet Take 1 tablet (10 mg total) by mouth daily.  . Multiple Vitamin (MULTIVITAMIN) capsule Take 1 capsule by mouth daily.    . nitroGLYCERIN (NITROSTAT) 0.4 MG SL tablet Place 1 tablet (0.4 mg total) under the tongue every 5 (five) minutes as needed for chest pain.  . Omega-3 Fatty Acids (FISH OIL) 1200 MG CAPS Take 1 capsule by mouth daily.    . simvastatin (ZOCOR) 20 MG tablet Take 1 tablet (20 mg total) by mouth daily at 6 PM.  . sodium chloride (OCEAN) 0.65 % SOLN nasal spray Place 1 spray into both nostrils as needed for congestion.  .Marland Kitchentiotropium (SPIRIVA) 18 MCG inhalation capsule Place 1 capsule (18 mcg total) into inhaler and inhale daily.  . [DISCONTINUED] amLODipine (NORVASC) 10 MG tablet TAKE 1 TABLET (10 MG TOTAL) BY MOUTH DAILY.   No facility-administered encounter medications on file as of 11/21/2015.    Allergies  Allergen Reactions  . Dextromethorphan-Guaifenesin     REACTION: GI upset    Current Medications, Allergies, Past Medical History, Past Surgical History, Family History, and Social History were reviewed in CRel[employer] Energyrecord.    Review of Systems        See HPI - all other systems neg except as noted... The patient complains of some dyspnea on exertion.  The patient denies anorexia, fever, weight loss, weight gain, vision loss, decreased hearing, hoarseness, chest pain, syncope, peripheral edema, prolonged cough, headaches, hemoptysis, abdominal pain, melena,  hematochezia, severe indigestion/heartburn, hematuria, incontinence, muscle weakness, suspicious skin lesions, transient blindness, difficulty walking, depression, unusual weight change, abnormal bleeding, enlarged lymph nodes, and angioedema.  Objective:   Physical Exam    WD, Obese, 70 y/o WM in NAD... Vital Signs:  Reviewed... GENERAL:  Alert & oriented; pleasant & cooperative... HEENT:  [place]/AT, EOM-wnl, PERRLA, EACs-clear, TMs-wnl, NOSE- sl congestion, THROAT-clear & wnl. NECK:  Supple w/ fairROM; no JVD; normal carotid impulses w/o bruits; no thyromegaly or nodules palpated; no lymphadenopathy. CHEST:  decr BS at bases bilat w/ few scat rhonchi, no wheezing/ rales/ or signs of consolidation... HEART:  Regular Rhythm; without murmurs/ rubs/ or gallops detected... ABDOMEN:  Obese, soft, & nontender; sm knot palpated in mid abd above umbilicus; normal bowel sounds; no organomegaly detected. EXT: without deformities, mild arthritic changes; no varicose veins/ +venous insuffic/ no edema. NEURO:  CN's intact; no focal neuro deficits... DERM:  No lesions noted; no rash etc...  RADIOLOGY DATA:  Reviewed in the EPIC EMR & discussed w/ the patient...  LABORATORY DATA:  Reviewed in the EPIC EMR & discussed w/ the patient...   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  1/6>  referred for PulmRehab, contact [name] re- POC, he still needs Full PFTs & CT Chest... We plan ROV in 6 wks  Acute sinusitis> presented 1/16 w/ 2wk hx bilat max & frontal sinusitis; treated w/ Augmentin875 Bid + nasal saline etc...   Primary  [name] followed by [name]>> HBP>   CAD>            Followed by [name] & [name] LIPIDS>   DM>   GI> Hx Gastritis, Polyps>   GU>   Followed by [name]   Patient's Medications  New Prescriptions   No medications on file  Previous Medications   ALBUTEROL (PROVENTIL HFA;VENTOLIN HFA) 108 (90 BASE) MCG/ACT INHALER    Inhale 2 puffs into the lungs 4 (four) times daily as needed for wheezing (for use when not at home / unable to use nebulizer).   ALBUTEROL (PROVENTIL) (2.5 MG/3ML) 0.083% NEBULIZER SOLUTION    Take 3 mLs (2.5 mg total) by nebulization 3 (three) times daily.   ALPRAZOLAM (XANAX) 0.5 MG TABLET    Take 0.5 mg by mouth 3 (three) times daily as needed for anxiety or sleep.   ASPIRIN 81 MG TABLET    Take 81 mg by mouth at bedtime.    FLAXSEED, LINSEED, (FLAXSEED OIL) 1000 MG CAPS    Take 1 capsule by mouth daily.     FLUTICASONE-SALMETEROL (ADVAIR DISKUS) 250-50 MCG/DOSE AEPB    Inhale 1 puff into the lungs every 12 (twelve) hours.   IBUPROFEN (ADVIL,MOTRIN) 600 MG TABLET    Take 600 mg by mouth every 6 (six) hours as needed for moderate pain.   LISINOPRIL (PRINIVIL,ZESTRIL) 10 MG TABLET    Take 1 tablet (10 mg total) by mouth daily.   MULTIPLE VITAMIN (MULTIVITAMIN) CAPSULE    Take 1 capsule by mouth daily.     NITROGLYCERIN (NITROSTAT) 0.4 MG SL TABLET    Place 1 tablet (0.4 mg total) under the tongue every 5 (five) minutes as needed for chest pain.   OMEGA-3 FATTY ACIDS (FISH OIL) 1200 MG CAPS    Take 1 capsule  by mouth daily.     SIMVASTATIN (ZOCOR) 20 MG TABLET    Take 1 tablet (20 mg total) by mouth daily at 6 PM.   SODIUM CHLORIDE (OCEAN) 0.65 % SOLN NASAL SPRAY    Place 1 spray into both nostrils as needed for congestion.   TIOTROPIUM (SPIRIVA) 18 MCG INHALATION CAPSULE    Place 1 capsule (18 mcg total) into inhaler and inhale daily.  Modified Medications   Modified Medication Previous Medication   AMLODIPINE (NORVASC) 10 MG TABLET amLODipine (NORVASC) 10 MG tablet       TAKE 1 TABLET (10 MG TOTAL) BY MOUTH DAILY.    TAKE 1 TABLET (10 MG TOTAL) BY MOUTH DAILY.  Discontinued Medications   No medications on file

## 2015-11-24 ENCOUNTER — Encounter: Payer: Self-pay | Admitting: *Deleted

## 2015-11-24 ENCOUNTER — Ambulatory Visit: Payer: Self-pay | Admitting: *Deleted

## 2015-11-24 ENCOUNTER — Other Ambulatory Visit: Payer: Self-pay | Admitting: *Deleted

## 2015-11-24 NOTE — Patient Outreach (Signed)
[facility]) Care Management  11/24/2015  [name] [birth year] [id number]   Transition of care  Initial Home visit scheduled for today, Visit canceled due to weather conditions. I spoke with patient by telephone patient stating "It is not a good idea for you, to come out here today, you probably can't get in the drive.  [name] reports that he is doing great, had another visit with Dr.Nadel and hopes to start Pulmonary rehab soon, as MD has sent in a request. [name] reports that he is taking all medications, inhaler, nebulizer treatments as ordered. Patient reports that he carries his rescue inhaler in his pocket, but denies having to use it . Patient states that he has not smoked since December 2, and doesn't plan to start again, and does not want anyone preaching to him about smoking. Patient continues to use oxygen 2 liters by nasal cannula. Reviewed COPD zones, patient describes his breathing as in the Green zone.   [name] declines rescheduling a home visit, reports that he is managing well at home. Patient is agreeable to final transition of care phone call and next week and follow up by telephone with Health Coach.   Plan Patient will benefit from further education on COPD Send patient COPD education packet, COPD magnet and [facility] Calendar Final Transition of care call on next week, and transition patient to Health Coach for further COPD education  Andrew Draft, RN, [place] Management [phone]- Mobile [phone]- [place]  .

## 2015-11-27 DIAGNOSIS — J441 Chronic obstructive pulmonary disease with (acute) exacerbation: Secondary | ICD-10-CM | POA: Diagnosis not present

## 2015-11-27 DIAGNOSIS — I10 Essential (primary) hypertension: Secondary | ICD-10-CM | POA: Diagnosis not present

## 2015-11-27 DIAGNOSIS — J9601 Acute respiratory failure with hypoxia: Secondary | ICD-10-CM | POA: Diagnosis not present

## 2015-11-27 DIAGNOSIS — I272 Other secondary pulmonary hypertension: Secondary | ICD-10-CM | POA: Diagnosis not present

## 2015-11-27 DIAGNOSIS — Z87891 Personal history of nicotine dependence: Secondary | ICD-10-CM | POA: Diagnosis not present

## 2015-11-27 DIAGNOSIS — Z6833 Body mass index (BMI) 33.0-33.9, adult: Secondary | ICD-10-CM | POA: Diagnosis not present

## 2015-12-01 ENCOUNTER — Encounter: Payer: Self-pay | Admitting: *Deleted

## 2015-12-01 ENCOUNTER — Other Ambulatory Visit: Payer: Self-pay | Admitting: *Deleted

## 2015-12-01 ENCOUNTER — Telehealth: Payer: Self-pay | Admitting: Pulmonary Disease

## 2015-12-01 ENCOUNTER — Ambulatory Visit: Payer: Self-pay | Admitting: *Deleted

## 2015-12-01 DIAGNOSIS — J441 Chronic obstructive pulmonary disease with (acute) exacerbation: Secondary | ICD-10-CM

## 2015-12-01 NOTE — Telephone Encounter (Signed)
Attempted to call [name]. Received a fast busy signal. Will try back.

## 2015-12-01 NOTE — Telephone Encounter (Signed)
Order was placed 11/21/15 for the POC eval to [facility]. LMTCB x 1 with Melissa from [facility] to see if pt did not truly pass?

## 2015-12-01 NOTE — Patient Outreach (Signed)
[facility]) Care Management  12/01/2015  TREMEL SETTERS Mar 26, 1946 [id number]  Transition of care call final call  Mr.Yu reports that he is doing fine, he continues to wear his oxygen at 2 liters, he states that his oxygen saturation per his meter is 97 %. He denies increased shortness of breath, cough or sputum production, and denies need for his rescue inhaler.  Patient verified that he has received mailed information regarding COPD packet and [facility] calendar.  Mr.Bettenhausen reports that he is managing well at home, taking all of medications including nebulizer treatments as ordered.  Mr.Korson voiced his only concern is regarding his recent evaluation for the "simply go mini" oxygen unit. Patient voiced concern regarding [facility] at [facility] , that he did not qualify for the mini tank.   Plan Follow up with [facility] regarding patient concerns, notify Dr.Nadel of patient request another evaluation for the simply go Mini unit.- ( as [place] states current order has expired ) Spoke with Apolonio Schneiders at [facility] and she stated that patient did not pass the evaluation with the respiratory therapist for this oxygen unit.- I have placed a phone call to Dr.Nadel office spoke with Wilburn Cornelia that took the message regarding request. I will follow up with Mr.Rhudy regarding communicating his concern to Dr.Nadel Will plan to close case and transfer to Health Coach patient in agreement for continued education for COPD  Joylene Draft, RN, [place] Management [phone]- Mobile [phone]- [place] Office

## 2015-12-01 NOTE — Telephone Encounter (Signed)
Melissa called back [phone]

## 2015-12-02 NOTE — Telephone Encounter (Signed)
Spoke with Melissa at [facility], states he was at 85% sp02 on 5lpm pulse- states he could perhaps use a Simply Go to deliver 2lpm continuous, but did not qualify for the Simply Go Mini.  Melissa states that the Simply Go is larger and is unsure if pt would want this.    SN would you be ok with this order?  Thanks!

## 2015-12-02 NOTE — Telephone Encounter (Signed)
Per SN: please explain to the patient that it is his choice, but he may want to wait to see if he improves over time on therapy and then requalify for the mini.

## 2015-12-02 NOTE — Telephone Encounter (Signed)
Spoke with pt, states he will wait until after his next ov with SN to reevaluate on 01/07/16.  Nothing further needed at this time.

## 2015-12-04 ENCOUNTER — Telehealth (HOSPITAL_COMMUNITY): Payer: Self-pay

## 2015-12-08 ENCOUNTER — Ambulatory Visit: Payer: No Typology Code available for payment source | Admitting: Pulmonary Disease

## 2015-12-15 ENCOUNTER — Telehealth: Payer: Self-pay | Admitting: Pulmonary Disease

## 2015-12-15 ENCOUNTER — Other Ambulatory Visit: Payer: Self-pay | Admitting: Pulmonary Disease

## 2015-12-15 NOTE — Telephone Encounter (Signed)
Patient calling to check on the status of Pulmonary Rehab referral. Patient says that he spoke with Truddie Crumble at Pulmonary Rehab and that Truddie Crumble told her that she sent papers to our office to have them filled out and they are awaiting the orders.    [facility] - please advise on status of this referral.  Patient getting upset.

## 2015-12-15 NOTE — Telephone Encounter (Signed)
Called and spoke with pt. Informed him of the below. Pt voiced understanding and had no further questions. Will forward to [facility] for follow up.

## 2015-12-15 NOTE — Telephone Encounter (Signed)
The pulm rehab form was given to Dr Lenna Gilford on January 6 for him to complete.  He hasn't given it back for it to be sent over to Pulmonary Rehab.

## 2015-12-17 ENCOUNTER — Telehealth: Payer: Self-pay | Admitting: Pulmonary Disease

## 2015-12-17 NOTE — Telephone Encounter (Signed)
Patient calling to confirm that we refilled his Advair and Spiriva. Advised patient that Advair and Spiriva were sent to his pharmacy yesterday and that we received confirmation that it has been received from pharmacy.   ADVAIR DISKUS 250-50 MCG/DOSE AEPB 60 each 11 12/16/2015       Sig: INHALE 1 PUFF BY MOUTH EVERY 12 HOURS     E-Prescribing Status: Receipt confirmed by pharmacy (12/16/2015 4:28 PM EST)     SPIRIVA HANDIHALER 18 MCG inhalation capsule 90 capsule 3 12/16/2015       Sig: INHALE CONTENTS OF 1 CAPSULE DAILY     E-Prescribing Status: Receipt confirmed by pharmacy (12/16/2015 4:28 PM EST)

## 2015-12-17 NOTE — Telephone Encounter (Signed)
[name], please advise status for this form, thanks

## 2015-12-17 NOTE — Telephone Encounter (Signed)
This has not been returned back to me nor do I remember seeing this.   PCC's can a new form be completed for him to sign? Thanks.

## 2015-12-17 NOTE — Telephone Encounter (Signed)
I will bring another pulmonary rehab form to [place].

## 2015-12-18 NOTE — Telephone Encounter (Signed)
Form completed and given back to [place]. Called and spoke to pt to inform him. Pt verbalized understanding and denied any further questions or concerns at this time.

## 2015-12-30 DIAGNOSIS — Z8551 Personal history of malignant neoplasm of bladder: Secondary | ICD-10-CM | POA: Diagnosis not present

## 2015-12-30 DIAGNOSIS — Z0000 Encounter for general adult medical examination without abnormal findings: Secondary | ICD-10-CM | POA: Diagnosis not present

## 2016-01-06 ENCOUNTER — Ambulatory Visit: Payer: Medicare Other | Admitting: Pulmonary Disease

## 2016-01-07 ENCOUNTER — Ambulatory Visit: Payer: Medicare Other | Admitting: Pulmonary Disease

## 2016-01-14 ENCOUNTER — Other Ambulatory Visit: Payer: Self-pay | Admitting: *Deleted

## 2016-01-14 NOTE — Patient Outreach (Signed)
[place] [facility]) Care Management  01/14/2016  BRANDON WIECHMAN 19-Apr-1946 [id number]   RN Health Coach telephone call to patient.  Hipaa compliance verified. RN Health Coach  introduced self to patient. Patient was very agreeable to follow up out reach calls. Patient was referred by Joylene Draft.  Plan: RN Health Coach will do follow up call within a month for assessment. Patient agreed to follow up call  [name], BSN, [place] Management RN Health Coach Phone: [place] complies with applicable Federal civil rights laws and does not discriminate on the basis of race, color, national origin, age, disability, or sex. Espaol (Spanish)  [place] cumple con las leyes federales de derechos civiles aplicables y no discrimina por motivos de raza, color, nacionalidad, edad, discapacidad o sexo.    [place]  [place] tun th? lu?t dn quy?n hi?n hnh c?a Lin bang v khng phn bi?t ?i x? d?a trn ch?ng t?c, mu da, ngu?n g?c qu?c gia, ? tu?i, khuy?t t?t, ho?c gi?i tnh.    (Arabic)    [place]                      .

## 2016-01-28 ENCOUNTER — Ambulatory Visit: Payer: Self-pay | Admitting: *Deleted

## 2016-01-28 ENCOUNTER — Other Ambulatory Visit: Payer: Self-pay | Admitting: *Deleted

## 2016-01-29 ENCOUNTER — Encounter: Payer: Self-pay | Admitting: *Deleted

## 2016-01-29 ENCOUNTER — Other Ambulatory Visit: Payer: Self-pay | Admitting: *Deleted
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
## 2016-01-29 NOTE — Patient Outreach (Signed)
[facility]) Care Management  01/29/2016  [name] [birth year] [id number]   RN Health Coach telephone call to patient.  Hipaa compliance verified. Patient had been upset because he hadn't heard anything from Rehab. RN Health Coach called [name] and made sure that they had received the forms from the Dr.  Pulmonary rehab has received the forms and  will be calling the patient soon.  RN made patient aware that they have been received. Patient was very pleased with information.  [name], BSN, RN [facility] Care Management RN Health Coach Phone: [place] complies with applicable Federal civil rights laws and does not discriminate on the basis of race, color, national origin, age, disability, or sex. Espaol (Spanish)  [place] cumple con las leyes federales de derechos civiles aplicables y no discrimina por motivos de raza, color, nacionalidad, edad, discapacidad o sexo.    [place]  [place] tun th? lu?t dn quy?n hi?n hnh c?a Lin bang v khng phn bi?t ?i x? d?a trn ch?ng t?c, mu da, ngu?n g?c qu?c gia, ? tu?i, khuy?t t?t, ho?c gi?i tnh.    (Arabic)    [place]                      .

## 2016-02-02 ENCOUNTER — Encounter: Payer: Self-pay | Admitting: *Deleted

## 2016-02-03 ENCOUNTER — Ambulatory Visit (INDEPENDENT_AMBULATORY_CARE_PROVIDER_SITE_OTHER): Payer: Medicare Other | Admitting: Pulmonary Disease

## 2016-02-03 ENCOUNTER — Encounter: Payer: Self-pay | Admitting: Pulmonary Disease

## 2016-02-03 ENCOUNTER — Telehealth: Payer: Self-pay | Admitting: Pulmonary Disease

## 2016-02-03 VITALS — BP 132/64 | HR 80 | Temp 97.1°F | Ht 66.0 in | Wt 231.0 lb

## 2016-02-03 DIAGNOSIS — J209 Acute bronchitis, unspecified: Secondary | ICD-10-CM | POA: Insufficient documentation

## 2016-02-03 DIAGNOSIS — I2510 Atherosclerotic heart disease of native coronary artery without angina pectoris: Secondary | ICD-10-CM

## 2016-02-03 DIAGNOSIS — I10 Essential (primary) hypertension: Secondary | ICD-10-CM | POA: Diagnosis not present

## 2016-02-03 DIAGNOSIS — J449 Chronic obstructive pulmonary disease, unspecified: Secondary | ICD-10-CM

## 2016-02-03 DIAGNOSIS — Z87891 Personal history of nicotine dependence: Secondary | ICD-10-CM

## 2016-02-03 DIAGNOSIS — I272 Other secondary pulmonary hypertension: Secondary | ICD-10-CM

## 2016-02-03 DIAGNOSIS — I420 Dilated cardiomyopathy: Secondary | ICD-10-CM

## 2016-02-03 DIAGNOSIS — J9611 Chronic respiratory failure with hypoxia: Secondary | ICD-10-CM

## 2016-02-03 DIAGNOSIS — E669 Obesity, unspecified: Secondary | ICD-10-CM

## 2016-02-03 LAB — PULMONARY FUNCTION TEST
DL/VA % pred: 29 %
DL/VA: 1.29 ml/min/mmHg/L
DLCO UNC % PRED: 22 %
DLCO UNC: 6.03 ml/min/mmHg
DLCO cor % pred: 23 %
DLCO cor: 6.4 ml/min/mmHg
FEF 25-75 PRE: 0.38 L/s
FEF 25-75 Post: 0.43 L/sec
FEF2575-%Change-Post: 12 %
FEF2575-%Pred-Post: 20 %
FEF2575-%Pred-Pre: 18 %
FEV1-%CHANGE-POST: 2 %
FEV1-%PRED-POST: 39 %
FEV1-%PRED-PRE: 38 %
FEV1-PRE: 1.05 L
FEV1-Post: 1.07 L
FEV1FVC-%CHANGE-POST: 3 %
FEV1FVC-%PRED-PRE: 55 %
FEV6-%Change-Post: 8 %
FEV6-%Pred-Post: 69 %
FEV6-%Pred-Pre: 64 %
FEV6-POST: 2.44 L
FEV6-Pre: 2.25 L
FEV6FVC-%Change-Post: 9 %
FEV6FVC-%PRED-POST: 101 %
FEV6FVC-%Pred-Pre: 93 %
FVC-%Change-Post: 0 %
FVC-%PRED-PRE: 68 %
FVC-%Pred-Post: 68 %
FVC-PRE: 2.57 L
FVC-Post: 2.55 L
PRE FEV6/FVC RATIO: 87 %
Post FEV1/FVC ratio: 42 %
Post FEV6/FVC ratio: 96 %
Pre FEV1/FVC ratio: 41 %
RV % PRED: 176 %
RV: 3.97 L
TLC % PRED: 107 %
TLC: 6.67 L

## 2016-02-03 MED ORDER — LEVOFLOXACIN 500 MG PO TABS: 500.0000 mg | ORAL_TABLET | Freq: Every day | ORAL | Status: DC

## 2016-02-03 NOTE — Telephone Encounter (Signed)
Called and spoke to rep at the call center at [place]. She stated their office has closed and will relay the information to [place] come Wednesday morning to return our call.

## 2016-02-03 NOTE — Progress Notes (Signed)
PFT done today. 

## 2016-02-03 NOTE — Progress Notes (Addendum)
Subjective:    Patient ID: [name], male    DOB: July 09, 1946, 70 y.o.   MRN: [id number]  HPI 70 y/o WM here for a follow up visit and on-going management of mult med problems...  ~  SEE PREV EPIC NOTES FOR OLDER DATA >>    CXR 7/14 showed COPD/ emphysema, incr markings and scarring at the bases, NAD...  LABS 7/14:  FLP- ok on Simva20;  Chems- ok x BS=120 A1c=6.7;  CBC- wnl;  TSH=1.26;  PSA=0.41...  [name] has established Primary Care follow up w/ [name] at [name]>>   CXR 7/15 showed underlying COPD, mild bibasilar fibrosis, norm heart size, DJD in spine, NAD...   PFT 7/15 showed> FVC=3.18 (78%), FEV1=1.22 (38%), %1sec=38; mid-flows=13% predicted... This is c/w GOLD Stage3 COPD and we discussed this...  ~  December 05, 2014:  267moROV & pulmonary recheck; [name] continued smoking, perhaps decr slightly to 1/2ppd but unable to quit & he has gained 6# in the interim up to 221# today;  His CC is sinusitis w/ nasal congestion, drainage & green mucus plus maxillary/ frontal HAs; he denies f/c/s, notes stable cough & chest symptoms w/ DOE;  He has been taking his Advair250Bid but prev refused addition of an anticholinergic due to "the donut hole";  States his breathing is "fine, except in cold air" ( we discussed wearing a scarf in winter etc)... He has GOLD Stage3 COPD w/ FEV1=1.22 (38%predicted) done 7/15;;  Exam shows decr BS bilat w/ few scat rhonchi & mild exp wheezing esp w/ forced maneuver...     We reviewed prob list, meds, xrays and labs> see below for updates >> [facility]... PLAN>> we discussed treating his acute max sinusitis w/ Augmentin875Bid, plus align daily & nasal saline as needed; he knows the importance of smoking cessation but is not motivated & declines smoking cessation help; he will continue the Advair250Bid 7 we are adding spiriva via handihaler once per day; we plan ROV in 6467mo/ f/u CXR & PFT,  call in the interim for any breathing issues....   ~  June 05, 2015:  67m47moV & [name] a good interval, no new complaints or concerns; still smoking +/-1ppd and blames family stress; notes cough, sm amy yellow sput, no hemoptysis, SOB w/o change/stable- still mows 3 yards, does chores, tends to 20+chickens & their eggs, etc; he has repeatedly declined smoking cessation help, clearly not motivated to quit, not even doing his inhalers regularly!!!    COPD, +smoker> on Advair250Bid & Spiriva daily; symptoms as above, he is stoic, still smoking 1ppd & NOT motivated to quit, asked to cut back & use both inhalers regularly...    HBP> on Metop25Bid, Amlod10, Lisin40; BP=114/64 & he denies CP, palpit, SOB, edema...    CAD> on ASA81; followed by [name] & seen 4/15 (overdue for f/u)> CAD, IWMI 1995, mult PCIs, last Myoview 2013 was low risk; too sedentary, no changes made; 73 79o brother died w/ AAA rupture...    Chol> on Simva20; FLP 7/14 showed TChol 158, TG 145, HDL 33, LDL 96; needs better diet & wt reduction and ret for Fasting blood work overdue.    DM> on diet alone; LABS 7/14 showed BS= 120, A1c= 6.7; we reviewed low carb diet, exercise program, and wt reduction...    Obese> wt recorded as 204# today but it doesn't look like he's lost 26#; we reviewed diet &  exercise...    GI- Gastritis, polyps> prev on Zantac; prev colon 2004 w/ hyperplastic polyp removed & f/u 8/14 by [name] w/ 3 polyps removed (5-60m size, all were tubular adenomas), +divertics in sigmoid, f/u planned 5 yrs... NOTE> exam showed a small palp knot in mid-abd above the umbilicus ?etiology (I offered CT Abd but he declines & wants to discuss w/ his primary- [name]).    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    DJD> on OTC analgesics  as needed...    Anxiety> on Alpraz0.18m57mrn... We reviewed prob list, meds, xrays and labs> see below for updates >>  NOTE> HE IS FOLLOWED BY [name] for GEN MED/ PCP now...  CXR 06/05/15 showed stable heart size, Ao atherosclerosis & tortuousiy, COPD/emphysema, incr markings at the bases, arthritis in Tspine...  LABS 7/16: pt asked to ret for fasting blood work... IMP/PLAN>>  [name] he is stable but continues to smoke 1ppd; CXR w/ COPD/emphysema, atherosclerotic Ao, chronic changes but NAD- we discussed the low-dose screening CT Chest for the early detection of lung cancer (he is a candidate & he will consider participating in this program); in the meanwhile- continue Advair/ Spiriva regularly;  He is overdue for Cards f/u w/ [name], and needs to continue Q6mo29mo w/ [name] (w/ copy of notes to us);[place] will ret for FASTING blood work.  ADDENDUM>> given his age and smoking history, [name] candidate for LUNG CANCER SCREENING w/ LOW-DOSE CT Chest>>  This office visit constitutes a face-to-face visit for the purpose of lung cancer screening counseling and a shared decision making visit...  Eligibility for LDCT screening:  1) Age=70 (must be 55-77y/o);  2) Absence of symptoms of lung cancer: he denies CP, ch in SOB, hemoptysis.  Smoking History:  1) He has 60+ pk-yrs (must have >30 pk-yr hx smoking);  2) Smoking status- still smoking ~1ppd (current or former smoker who quit<15 yrs ago).  Counseling:  Importance of smoking cessation &/or continued abstinence discussed ; offered smoking cessation interventions- pt declined; pt agreed to continued LDCT screening annually.  Shared decision making:  We reviewed the potential benefits and harms of screening> eg. Early detection of lung cancer and improved survival, the need for additional diagnostic tests & possible surgery, the risk of over-diagnosis/ false positives/ and radiation exposure... All questions were answered to the best  of my ability & he would like to consider the LDCT screening & he will let us kKoreaw his decision=> he ignored this discussion & never called.  ~  October 31, 2015:  5 month ROV & post hospital follow up visit>  [name] was Adm 12/5 - 10/29/15 by [facility] w/ acute hypoxemic resp failure precipitated by a COPD exac; for his part he noted incr SOB, cough, green sput, & "slowing down", he denied f/c/s, no CP, etc;  In the ER he was hypoxemic w/ O2sats in the 70s on RA and eval revealed> CXR- COPD, bullous dis, incr markings at bases, NAD; Cultures of blood/ sput/ urine were all neg; Serologies for strep & legionella were neg; Viral panel was neg;  LABs were OK (WBC=7.9=>14, Hg=15.3=>13.9, BS=100-140, BNP=498);  EKG showed NSR, rate78, NSSTTWA;  2DEcho 10/21/15 showed mildLVH, norm LVF w/ EF=55-60%, no regional wall motion abn, LA-mod  dil, RA- mod to severe dil, RV- mod dil, mod TR, PAsys-70mHg... He was treated w/ Oxygen, IV Solumedrol, Rocephin/ Zithromax, NEBS, Lovenox, flutter, etc; he was disch on Home O2- 2L/min activ & 1L/min rest, Pred taper, Levaquin, Advair250/ Spiriva...  He returns today feeling better, notes his home pulse-ox checks are improving & he has less SOB/DOE, less cough/sput, etc;  He says he has not smoked since PTA...      EXAM shows Afeb, VSS, O2sat=94% on 2L/min [place];  Wt=210# (he was 204 prev);  HEENT- neg, mallampati1;  Chest- decr BS bilat, few scat rhonchi, no w/r/consolidation;  Heart- RR w/o m/r/g;  Abd- obese, soft;  Ext- neg w/o c/c/e...  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    ~  November 21, 2015:  3wk ROV & [name] reports that he feels much better, SOB improved, cough improved, still prod of a sm amt green sput w/o blood w/o f/c/s;  He's been using the NEB w/ Albut TID followed by his AdvairBid & SpirivaQd; he is off the Pred now, he is too sedentary & not exercising => he needs PULM REHAB & we will refer, [name] we will inquire as to what he might be elig for...       EXAM shows Afeb, VSS, O2sat=98% on 2L/min [place];  Wt=216# (he was 204-210# prev);  HEENT- neg, mallampati2;  Chest- decr BS bilat, otherw clear w/o w/r/r;  Heart- RR w/o m/r/g;  Abd- obese, soft;  Ext- neg w/o c/c/e...  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 We plan ROV in 6 wks.  ~  February 03, 2016:  2-376moOV & Andrew Crateeturns w/ his severe obstructive lung dis, GOLD Stage 3, chr hypoxemic resp failure w/ pulmHTN (WHO group3); he quit smoking 10/2015 & is using home O2 3L/min, NEBS w/ Albut tid, Advair250Bid, Spiriva daily, Mucinex etc... He reports that breathing is stable overall but he is c/o 51m86mo URI w/ cough, thick yellow sput hard to produce, incr SOB 7 chest tightness but no fever, no CP=> he wants another antibiotic... He spent much time ventilating about his problems w/ DME-[facility], his O2 set-up, & [facility] program- they still haven't contacted him about starting Rehab... He does have mult entries into EPIC from [employer] reviewed...     EXAM shows Afeb, VSS, O2sat=92% on 3L/min [place];  Wt=231# (up 15# & he was 204-210# prev);  HEENT- neg, mallampati2;  Chest- decr BS bilat, otherw clear w/o w/r/r;  Heart- RR w/o m/r/g;  Abd- obese, soft;  Ext- neg w/o c/c/e.  PFT 02/03/16>  FVC=2.57 (68%), FEV1=1.05 (38%), %1sec=41, mid-flows reduced at 18% predicted; post bronchodil  FEV1 w/o change;  TLC=6.67 (107%), RV=3.97 (176%), RV/TLC=60;  DLCO=22% and DL/VA=29%... This is c/w a severe obstructive ventilatory defect w/ air trapping and severely decr DLCO c/w emphysema- GOLD Stage  3 COPD... IMP/PLAN>>  We discussed continuing the NEBS Tid followed by Advair250Bid & Spiriva daily; add Mucinex '1200mg'$ Bid & we will Rx w/ Levaquin500/d x7d for his bronchitic infection (see AVS);  He still needs a CT Chest but wants to wait for now;  Needs to get into [name] program [facility], and he wants diff DME supplier- we will inquire, plan ROV 31mo..  ADDENDUM 03/18/16>> Pt unable to afford inhalers- ADVAIR & SPIRIVA discontinued and placed on NEBULIZER w/ DUONEB QID & PULMICORT 0.25 BID regularly (we will update med list to reflect this change).          Problem List:  Hx of SINUSITIS (ICD-473.9) - off Nasonex now & recent Rx w/ Augmentin/ Saline... notes occas sinus congestion, drainage, sl HA, etc... he knows that he needs to quit smoking! ~  10/10: Adm for epistaxis w/ eval by DrWolicki- CXR= NAD, labs OK, rec for saline lavage- nose bleed resolved & disch home (no recurrence so far). ~  1/16: he presented w/ acute max & frontal sinusitis> Rx w/ Augmentin, Align, Saline, Mucinex, etc... ~  3/17: he has acute bronchitic exac- treated w/ Levaquin x7d + his regular meds...  COPD (ICD-496) >>   ~  on ADVAIR 250Bid; STILL SMOKES CIGARETTES & he's refused Chantix...  ~  He had hemoptysis 6/99 without lesion on CXR, CTChest, or Bronchoscopy... ~  Adm 10/10 w/ epistaxsis & eval DrWolicki as noted> he wanted to do Sleep Study but pt cancelled it & declined to resched. ~  CXR 5/11 showed COPD/ bullous emphysema, NAD..[name] ~  CXR 5/12 showed ectatic calcif & tort Aorta, COPD w/ incr markings ar bases, NAD, +degen spondylosis. ~  CXR 6/13 showed COPD/ emphysema, scarring in the lower lobes, otherw clear lungs, DJD in spine... ~  CXR 7/14 showed COPD/ emphysema, incr markings and scarring at the  bases, NAD. ~  1/15: EJaquelyn Bittersays he's decr his smoking from prev 3-4ppd down to 3 cig/d;  He remains on Advair250> states breathing is ok, at baseline CAT score=12; baseline cough, sput, no hemoptysis, stable DOE, no edema... ~  CXR 7/15 showed underlying COPD, mild bibasilar fibrosis, norm heart size, DJD in spine, NAD...  ~  PFT 7/15 showed> FVC=3.18 (78%), FEV1=1.22 (38%), %1sec=38; mid-flows=13% predicted... This is c/w GOLD Stage3 COPD and we discussed this> He is rec to quit all smoking immediately; he has been using the nicotine gum & rec to try patches, offered Wellbutrin, he refuses Chantix; needs to continue ADVAIR250Bid & samples givem; he declines to start [place] this time due to cost & donut hole (we will address this again on return); reminded to use MUCINEX '600mg'$ - 1to2 Bid w/ fluids, and OK to use OTC DELSYM as needed for cough...   ~  1/16: he claims stable on Advair250 but still smoking & can't vs won't quit; we decided to add Spiriva daily via handihaler... 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 ~  12/16:  [name], COPD exac, and found to have pulmHTN w/ 2DEcho showing PAsys=51;  Treated w/ O2, Pred taper, antibiotics, NEBS, Advair/Spiriva, etc;  He says he has quit smoking... ~  [facility] CXR- COPD, bullous dis, incr markings at bases, NAD; Cultures of blood/ sput/ urine were all neg; Serologies for strep & legionella were neg; Viral panel was neg; He was treated w/ Oxygen, IV Solumedrol, Rocephin/ Zithromax, NEBS, Lovenox, flutter, etc; He was disch on Home O2- 2L/min activ & 1L/min rest, Pred taper, NEBS, Levaquin, Advair250/ Spiriva => improved. ~  3/17:  severe obstructive  lung dis, GOLD Stage 3, chr hypoxemic resp failure w/ pulmHTN (WHO group3); he quit smoking 10/2015 & is using home O2 3L/min, NEBS w/ Albut tid, Advair250Bid, Spiriva daily, Mucinex etc; he has acute bronchitis exac- Rx w/ Levaquin x7d, continue Home O2 24/7 and push to start Pulm Rehab...   HE HAS ESTABLISHED PRIMARY CARE w/ [name], [facility]>>   HYPERTENSION (ICD-401.9) - controlled on METOPROLOL '25mg'$ Tid,  NORVASC '10mg'$ /d, LISINOPRIL '40mg'$ /d...   ~  11/11:  BP=122/70, not checking BP's at home... denies HA, fatigue, visual changes, CP, palipit, dizziness, syncope, dyspnea, edema, etc. ~  5/12:  BP= 120/72, pt very pleased w/ his wt loss & improved exercise ability. ~  6/13:  BP= 122/64 & feeling well w/o CP, palpit, SOB, edema, etc... ~  12/13:  BP=122/82 & he denies CP, palpit, SOB, edema... ~  7/14: on Metop25-3/d, Amlod10, Lisin40; BP=130/78 & he denies CP, palpit, SOB, edema ~  1/15: BP remains stable on Metop25-2AM & 1PM, Amlod5, Lisin40; BP= 120/72 and he denies CP, palpit, ch in SOB, edema. ~  2016> His meds have been adjusted- now on Rx w/ Amlod10, Lisin10; BP= 130/72 7 he denies CP, palpit, edema; chr SOB/DOE due to his COPD.  CORONARY ARTERY DISEASE (ICD-414.00) - ASA '81mg'$ /d & off Plavix as of 2/13 officially as he wasn't taking it regularly anyway. ~  Hx prev IWMI and PTCA/ stent in RCA by DrBrodie 12/95 & 5/96...  ~  Myoview 4/08 abnormal w/ inferior ischemia and subseq cath revealing restenoses in RCA- s/p repeat PTCA/ stents...  ~  f/u Myoview 9/08 w/ prev infer infarct, no ischemia, EF=56%... ~  9/11:  pre op cardiac eval DrBrodie> OK to hold ASA/ Plavix for bladder surg. ~  2/12: f/u [name] w/ hx CAD, MI in 95, mult PCIs last 2009> stable, on ASA/ Plavix, no changes made; EKG= NSR, WNL... ~  2/13:  He had f/u [name] w/ Myoview showing a fixed defect in infer wall, no ischemia, EF=58%; pt wasn't taking Plavix, therefore stopped in favor of ASA'81mg'$ /d... ~  3/14:  He  had f/u [name]> CAD, IWMI 1995, mult PCIs, last Myoview 2013 was low risk; too sedentary, no changes made; 68 y/o brother died recently w/ AAA rupture... ~  EKG 3/14 showed NSR, rate65, WNL, NAD... ~  He maintains f/u w/ CARDS, [name]- known CAD, Hx inferior MI 1995, s/p mult PCIs- most recent 2009; finally qit smoking 12/16 after [facility] for AECOPD w/ hypoxemia; on ASA, statin, Amlod & Lisin.  HYPERCHOLESTEROLEMIA (Mixed Hyperlipidemia) - prev on Simva80 but DrBrodie decr him to SIMVASTATIN '20mg'$ /d + Fish Oil & Red Wine qd... ~  [place] 9/08 on Simva40 showed TChol 151, TG 106, HDL 23, LDL 107 ~  FLP 8/09 on Simva80 showed TChol 142, TG 135, HDL 28, LDL 87 ~  FLP 6/10 on Simva80 showed TChol 151, TG 117, HDL 31, LDL 97 ~  FLP in hosp 10/10 showed TChol 128, TG 110, HDL 26, LDL 80 ~  FLP 5/11 on Simva80 showed TChol 144, TG 215, HDL 26, LDL 89 ~  9/11:  DrBrodie  decr him to Simva20... we discussed the need for better diet & wt reduction. ~  FLP 5/12 on Simva20 showed TChol 162, TG 159, HDL 32, LDL 98 ~  FLP 6/13 on Simva20 showed TChol 130, TG 102, HDL 33, LDL 77... rec to incr exercise program & get wt down! ~  FLP 7/14 on Simva20 showed TChol 158, TG 145, HDL 33, LDL 96... He has gained way too much wt 7 must get on diet! ~  Followed by PCP- [name]...  DIABETES MELLITUS, BORDERLINE (ICD-790.29) - on diet alone... ~  labs 9/08 showed BS=100, HgA1c=6.1 ~  labs 8/09 showed BS= 107, A1c= 6.2 ~  labs 6/10 showed BS= 108, A1c= 6.2 ~  lab 5/11 (wt=244#) showed BS= 121. A1c= 6.7.[name]. he was told "get wt down, or start meds". ~  Labs 5/12 showed BS= 109, A1c= 6.1 ~  Labs 6/13 showed BS= 114, & he needs to lose the weight... ~  Labs 7/14 on diet alone showed BS= 120, A1c= 6.7 ~  Followed by PCP- [name]...  OBESITY (ICD-278.00) - weight was down as low as 202# in 2/09, and steadily incr to 244# by 5/11... ~  weight 5/11 = 244#.[name].  we reviewed diet + exercise necessary to lose weight... ~  weight  11/11 = 217# (after dx & rx for bladder cancer) ~  Weight 5/12 = 214# but says it's 192# at home... ~  Weight 6/13 = 211# but knows it's better since his pant/ belt is down to 36" (under his roll). ~  Weight 7/14 = 230# but he has quit smoking he says, now must lose the weight... ~  Weight 1/15 = 229# ~  Weight 1/17 = 216# ~  Weight 3/17 = 231# and we reviewed diet, exercise, wt reducing strategies...  GASTRITIS (ICD-535.50) - had EGD 10/05 showing gastritis... on RANITADINE '300mg'$ /d due to his Plavix Rx, off prev Omep.  COLONIC POLYPS (ICD-211.3) - last colonoscopy by [name] 3/04 showed 14m polyp = hyperplastic w/ f/u planned 146yr. 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 ~  4/12:  Pt reported f/u cysto was neg, doing well... ~  10/12:  Note from [name] reviewed> cysto was neg, no recurrent tumors... ~  4/13:  Note from [name] reviewed> neg f/u cysto w/o recurrent TCCa... He wants to continue Q6m66mostoscopies... ~  1/14:  He had f/u w/ [name]> Hx TCCa bladder, s/p TURBT 9/11, given BCG 11/11-12/11 and f/u Cysto 1/12 was neg; repeat cysto 1/14 was neg as well...  DEGENERATIVE JOINT DISEASE (ICD-715.90) - He has c/o right shoulder pain, hip pain, knee pain, and LBP> Rx ADVIL w/ some benefit... offered Ortho referral & he will decide.  ANXIETY (ICD-300.00) - on ALPRAZOLAM 0.'5mg'$  Prn...   Past Surgical History  Procedure Laterality Date  . Cataract surgery/sub posterior vitrectomy in left eye  1996    Dr. RanZadie Rhine Cataract surgery  septemeber 2013  . Ptca  [phone] Outpatient Encounter Prescriptions as of 02/03/2016  Medication Sig  . ADVAIR DISKUS 250-50 MCG/DOSE AEPB INHALE 1 PUFF BY MOUTH EVERY 12 HOURS  .  albuterol (PROVENTIL HFA;VENTOLIN HFA) 108 (90 BASE) MCG/ACT inhaler Inhale 2 puffs into the lungs 4 (four) times  daily as needed for wheezing (for use when not at home / unable to use nebulizer).  [name] albuterol (PROVENTIL) (2.5 MG/3ML) 0.083% nebulizer solution Take 3 mLs (2.5 mg total) by nebulization 3 (three) times daily.  [name] ALPRAZolam (XANAX) 0.5 MG tablet Take 0.5 mg by mouth 3 (three) times daily as needed for anxiety or sleep.  [name] amLODipine (NORVASC) 10 MG tablet TAKE 1 TABLET (10 MG TOTAL) BY MOUTH DAILY.  [name] aspirin 81 MG tablet Take 81 mg by mouth at bedtime.   . Flaxseed, Linseed, (FLAXSEED OIL) 1000 MG CAPS Take 1 capsule by mouth daily.    [name] ibuprofen (ADVIL,MOTRIN) 600 MG tablet Take 600 mg by mouth every 6 (six) hours as needed for moderate pain.  [name] lisinopril (PRINIVIL,ZESTRIL) 10 MG tablet Take 1 tablet (10 mg total) by mouth daily.  [name] METOPROLOL SUCCINATE ER PO Take by mouth. Pt unsure of MG, Pt states he is taking 2 tabs in morning and 1 tab in evening.  . Multiple Vitamin (MULTIVITAMIN) capsule Take 1 capsule by mouth daily.    . nitroGLYCERIN (NITROSTAT) 0.4 MG SL tablet Place 1 tablet (0.4 mg total) under the tongue every 5 (five) minutes as needed for chest pain.  . Omega-3 Fatty Acids (FISH OIL) 1200 MG CAPS Take 1 capsule by mouth daily.    . simvastatin (ZOCOR) 20 MG tablet Take 1 tablet (20 mg total) by mouth daily at 6 PM.  . sodium chloride (OCEAN) 0.65 % SOLN nasal spray Place 1 spray into both nostrils as needed for congestion.  [name] SPIRIVA HANDIHALER 18 MCG inhalation capsule INHALE CONTENTS OF 1 CAPSULE  DAILY    Allergies  Allergen Reactions  . Dextromethorphan-Guaifenesin     REACTION: GI upset    Current Medications, Allergies, Past Medical History, Past Surgical History, Family History, and Social History were reviewed in [employer] record.    Review of Systems        See HPI - all other systems neg except as noted... The patient  complains of some dyspnea on exertion.  The patient denies anorexia, fever, weight loss, weight gain, vision loss, decreased hearing, hoarseness, chest pain, syncope, peripheral edema, prolonged cough, headaches, hemoptysis, abdominal pain, melena, hematochezia, severe indigestion/heartburn, hematuria, incontinence, muscle weakness, suspicious skin lesions, transient blindness, difficulty walking, depression, unusual weight change, abnormal bleeding, enlarged lymph nodes, and angioedema.     Objective:   Physical Exam    WD, Obese, 70 y/o WM in NAD... Vital Signs:  Reviewed... GENERAL:  Alert & oriented; pleasant & cooperative... HEENT:  [name]/AT, EOM-wnl, PERRLA, EACs-clear, TMs-wnl, NOSE- sl congestion, THROAT-clear & wnl. NECK:  Supple w/ fairROM; no JVD; normal carotid impulses w/o bruits; no thyromegaly or nodules palpated; no lymphadenopathy. CHEST:  decr BS at bases bilat w/ few scat rhonchi, no wheezing/ rales/ or signs of consolidation... HEART:  Regular Rhythm; without murmurs/ rubs/ or gallops detected... ABDOMEN:  Obese, soft, & nontender; sm knot palpated in mid abd above umbilicus; normal bowel sounds; no organomegaly detected. EXT: without deformities, mild arthritic changes; no varicose veins/ +venous insuffic/ no edema. NEURO:  CN's intact; no focal neuro deficits... DERM:  No lesions noted; no rash etc...  RADIOLOGY DATA:  Reviewed in the EPIC EMR & discussed w/ the patient...  LABORATORY DATA:  Reviewed in the EPIC EMR & discussed w/ the patient...   Assessment & Plan:    COPD/emphysema, GOLD Stage 3, chronic hypoxemic resp failure- on Home O2, exsmoker- quit 10/2015, pulmonary HTN-  WHO group 3>   12/16>  I congratulated him on not smoking & we discussed continuing the Oxygen, Prednisone taper, Advair, Spiriva, NEBS w/ Albut, Mucinex/flutter;  He will follow up in 3 wks when the Pred has been tapered & we briefly discussed the need for Full PFTs and CT Chest later...   11/21/15>  referred for PulmRehab, contact Vera re- POC, he still needs Full PFTs & CT Chest... We plan ROV in 6 wks 02/02/16> We discussed continuing the NEBS Tid followed by Advair250Bid & Spiriva daily; add Mucinex '1200mg'$ Bid & we will Rx w/ Levaquin500/d x7d for his bronchitic infection (see AVS);  He still needs a CT Chest but wants to wait for now;  Needs to get into the [name] program ASAP, and he wants diff DME supplier- we will inquire, plan ROV 53mo  Primary Care followed by [name]>> HBP>   CAD>            Followed by [name] & DWilburt FinlayLIPIDS>   DM>   GI> Hx Gastritis, Polyps>   GU>   Followed by [name]   Patient's Medications  New Prescriptions   LEVOFLOXACIN (LEVAQUIN) 500 MG TABLET    Take 1 tablet (500 mg total) by mouth daily.  Previous Medications   ADVAIR DISKUS 250-50 MCG/DOSE AEPB    INHALE 1 PUFF BY MOUTH EVERY 12 HOURS   ALBUTEROL (PROVENTIL HFA;VENTOLIN HFA) 108 (90 BASE) MCG/ACT INHALER    Inhale 2 puffs into the lungs 4 (four) times daily as needed for wheezing (for use when not at home / unable to use nebulizer).   ALBUTEROL (PROVENTIL) (2.5 MG/3ML) 0.083% NEBULIZER SOLUTION    Take 3 mLs (2.5 mg total) by nebulization 3 (three) times daily.   ALPRAZOLAM (XANAX) 0.5 MG TABLET    Take 0.5 mg by mouth 3 (three) times daily as needed for anxiety or sleep.   AMLODIPINE (NORVASC) 10 MG TABLET    TAKE 1 TABLET (10 MG TOTAL) BY MOUTH DAILY.   ASPIRIN 81 MG TABLET    Take 81 mg by mouth at bedtime.    FLAXSEED, LINSEED, (FLAXSEED OIL) 1000 MG CAPS    Take 1 capsule by mouth daily.     IBUPROFEN (ADVIL,MOTRIN) 600 MG TABLET    Take 600 mg by mouth every 6 (six) hours as needed for moderate pain.   LISINOPRIL (PRINIVIL,ZESTRIL) 10 MG TABLET    Take 1 tablet (10 mg total) by mouth daily.   METOPROLOL SUCCINATE ER PO    Take by mouth. Pt unsure of MG, Pt states he is taking 2 tabs in morning and 1 tab in evening.   MULTIPLE VITAMIN (MULTIVITAMIN) CAPSULE    Take 1  capsule by mouth daily.     NITROGLYCERIN (NITROSTAT) 0.4 MG SL TABLET    Place 1 tablet (0.4 mg total) under the tongue every 5 (five) minutes as needed for chest pain.   OMEGA-3 FATTY ACIDS (FISH OIL) 1200 MG CAPS    Take 1 capsule by mouth daily.     SIMVASTATIN (ZOCOR) 20 MG TABLET    Take 1 tablet (20 mg total) by mouth daily at 6 PM.   SODIUM CHLORIDE (OCEAN) 0.65 % SOLN NASAL SPRAY    Place 1 spray into both nostrils as needed for congestion.   SPIRIVA HANDIHALER 18 MCG INHALATION CAPSULE    INHALE CONTENTS OF 1 CAPSULE  DAILY  Modified Medications   No medications on file  Discontinued Medications   No medications on  file  ADDENDUM>> Advair & Spiriva discontinued due to cost & replaced by DUONEB-Qid & PULMICORT 0,'25mg'$ Bid via NEBULIZER.Marland KitchenMarland Kitchen

## 2016-02-03 NOTE — Patient Instructions (Signed)
Today we updated your med list in our EPIC system...    Continue your current medications the same...    Continue the NEBULIZER 3 times daily followed by the [place] as discussed...  Add OTC MUCINEX '600mg'$  tabs- 2 tabs twice daily w/ extra water...  Today we wrote for [facility] antibiotic for your bronchitis- take one daily til gone...    Be sure to use an OTC Probiotic like ALIGN one daily while you are on the strong antibiotics...  We will inquire about the delay in Pulmonary rehab program, and the poss of switching Oxygen companies...  Call for any questions...  Let's plan a follow up visit in ~40mosooner if needed for problems..Marland KitchenMarland Kitchen

## 2016-02-04 NOTE — Telephone Encounter (Signed)
Spoke with Lynn/APS - needs OV note, O2 CMN(from [facility]) and O2 testing from when O2 was started.   O2 was started upon D/C from [facility] 10/29/15 - No CMN on file.  Walk test done 12/01/15 at OV with SN  Acute respiratory failure with hypoxia due to COPD exacerbation -Ongoing smoking but now understands that he can never smoke again  -Strep pneumo urine antigen / Legionella urine antigen both negative -weaned O2 as able - was not yet on home O2 prior to admit, but O2 support remained necessary at time of d/c - cont in attempts to wean - down to 1L [place] w/ rest at time of d/c, w/ 2-3LPM on exertion or when sleeping   Called and LM for Jeani Hawking to return call to make aware.

## 2016-02-04 NOTE — Telephone Encounter (Signed)
Jeani Hawking calling [phone]

## 2016-02-04 NOTE — Telephone Encounter (Signed)
Spoke with [name]- states that she is going to communicate with [facility] and see what all information she can get from them regarding the patient's O2 - if anything further needed she will call out office back for records. Nothing further needed.

## 2016-02-06 ENCOUNTER — Telehealth: Payer: Self-pay | Admitting: Pulmonary Disease

## 2016-02-06 ENCOUNTER — Encounter: Payer: Self-pay | Admitting: Emergency Medicine

## 2016-02-06 NOTE — Telephone Encounter (Signed)
Per SN: Pt has been waiting to hear from pulm rehab and will need to find out when pt can start and if there is anything else needed.   Called pulm rehab and spoke to [place]. Thayer Headings states she would find out what the hold up is and then call us back. Will await call.

## 2016-02-06 NOTE — Telephone Encounter (Signed)
Thayer Headings returned call and states she is unable to find the pt's referral form (the order was placed when referral forms were still needed). SN signed a new referral form, form faxed to [place] at [phone]. Thayer Headings is aware. Nothing further needed at this time.

## 2016-02-20 ENCOUNTER — Encounter (HOSPITAL_COMMUNITY): Payer: Self-pay

## 2016-02-20 ENCOUNTER — Encounter (HOSPITAL_COMMUNITY)
Admission: RE | Admit: 2016-02-20 | Discharge: 2016-02-20 | Disposition: A | Discharge status: Home / Self Care | Payer: Medicare Other | Source: Ambulatory Visit | Attending: Pulmonary Disease | Admitting: Pulmonary Disease

## 2016-02-20 ENCOUNTER — Telehealth: Payer: Self-pay | Admitting: Pulmonary Disease

## 2016-02-20 VITALS — BP 120/69 | HR 73 | Resp 12 | Ht 66.25 in | Wt 235.0 lb

## 2016-02-20 DIAGNOSIS — J449 Chronic obstructive pulmonary disease, unspecified: Secondary | ICD-10-CM | POA: Insufficient documentation

## 2016-02-20 DIAGNOSIS — I10 Essential (primary) hypertension: Secondary | ICD-10-CM | POA: Insufficient documentation

## 2016-02-20 DIAGNOSIS — I2510 Atherosclerotic heart disease of native coronary artery without angina pectoris: Secondary | ICD-10-CM | POA: Insufficient documentation

## 2016-02-20 DIAGNOSIS — Z7982 Long term (current) use of aspirin: Secondary | ICD-10-CM | POA: Insufficient documentation

## 2016-02-20 DIAGNOSIS — E7800 Pure hypercholesterolemia, unspecified: Secondary | ICD-10-CM | POA: Diagnosis not present

## 2016-02-20 DIAGNOSIS — J438 Other emphysema: Secondary | ICD-10-CM | POA: Insufficient documentation

## 2016-02-20 DIAGNOSIS — E669 Obesity, unspecified: Secondary | ICD-10-CM | POA: Diagnosis not present

## 2016-02-20 DIAGNOSIS — Z86010 Personal history of colonic polyps: Secondary | ICD-10-CM | POA: Diagnosis not present

## 2016-02-20 DIAGNOSIS — Z79899 Other long term (current) drug therapy: Secondary | ICD-10-CM | POA: Diagnosis not present

## 2016-02-20 DIAGNOSIS — Z87891 Personal history of nicotine dependence: Secondary | ICD-10-CM | POA: Diagnosis not present

## 2016-02-20 DIAGNOSIS — Z6837 Body mass index (BMI) 37.0-37.9, adult: Secondary | ICD-10-CM | POA: Insufficient documentation

## 2016-02-20 NOTE — Progress Notes (Signed)
Pulmonary Individual Treatment Plan  Patient Details  Name: [name] MRN: [id number] Date of Birth: [birth year] Referring Provider:  Noralee Space, MD  Initial Encounter Date:   Visit Diagnosis: Other emphysema [facility])  Patient's Home Medications on Admission:   Current outpatient prescriptions:  .  ADVAIR DISKUS 250-50 MCG/DOSE AEPB, INHALE 1 PUFF BY MOUTH EVERY 12 HOURS, Disp: 60 each, Rfl: 11 .  albuterol (PROVENTIL HFA;VENTOLIN HFA) 108 (90 BASE) MCG/ACT inhaler, Inhale 2 puffs into the lungs 4 (four) times daily as needed for wheezing (for use when not at home / unable to use nebulizer)., Disp: 1 Inhaler, Rfl: 0 .  albuterol (PROVENTIL) (2.5 MG/3ML) 0.083% nebulizer solution, Take 3 mLs (2.5 mg total) by nebulization 3 (three) times daily., Disp: 360 mL, Rfl: 3 .  ALPRAZolam (XANAX) 0.5 MG tablet, Take 0.5 mg by mouth 3 (three) times daily as needed for anxiety or sleep., Disp: , Rfl:  .  amLODipine (NORVASC) 10 MG tablet, TAKE 1 TABLET (10 MG TOTAL) BY MOUTH DAILY., Disp: 90 tablet, Rfl: 3 .  aspirin 81 MG tablet, Take 81 mg by mouth at bedtime. , Disp: , Rfl:  .  Flaxseed, Linseed, (FLAXSEED OIL) 1000 MG CAPS, Take 1 capsule by mouth daily.  , Disp: , Rfl:  .  ibuprofen (ADVIL,MOTRIN) 600 MG tablet, Take 600 mg by mouth every 6 (six) hours as needed for moderate pain., Disp: , Rfl:  .  lisinopril (PRINIVIL,ZESTRIL) 10 MG tablet, Take 1 tablet (10 mg total) by mouth daily., Disp: 30 tablet, Rfl: 0 .  METOPROLOL SUCCINATE ER PO, Take by mouth. Pt unsure of MG, Pt states he is taking 2 tabs in morning and 1 tab in evening., Disp: , Rfl:  .  Multiple Vitamin (MULTIVITAMIN) capsule, Take 1 capsule by mouth daily.  , Disp: , Rfl:  .  nitroGLYCERIN (NITROSTAT) 0.4 MG SL tablet, Place 1 tablet (0.4 mg total) under the tongue every 5 (five) minutes as needed for chest pain., Disp: 25 tablet, Rfl: 3 .  Omega-3 Fatty Acids (FISH OIL) 1200 MG CAPS, Take 1 capsule by mouth daily.  , Disp: ,  Rfl:  .  simvastatin (ZOCOR) 20 MG tablet, Take 1 tablet (20 mg total) by mouth daily at 6 PM., Disp: 30 tablet, Rfl: 2 .  sodium chloride (OCEAN) 0.65 % SOLN nasal spray, Place 1 spray into both nostrils as needed for congestion., Disp: , Rfl: 0 .  SPIRIVA HANDIHALER 18 MCG inhalation capsule, INHALE CONTENTS OF 1 CAPSULE  DAILY, Disp: 90 capsule, Rfl: 3  Past Medical History: Past Medical History  Diagnosis Date  . History of sinusitis   . Epistaxis   . COPD (chronic obstructive pulmonary disease) ([place])   . Cigarette smoker   . Hypertension   . CAD (coronary artery disease)   . Hypercholesteremia   . Borderline diabetes mellitus   . Obesity   . History of colonic polyps 2004    hyperplastic   . DJD (degenerative joint disease)   . Anxiety   . Bladder cancer ([place]) 2011  . Emphysema of lung ([place])     Tobacco Use: History  Smoking status  . Former Smoker -- 2.00 packs/day for 45 years  . Types: Cigarettes  . Quit date: 10/17/2015  Smokeless tobacco  . Never Used    Labs:     Recent Review Flowsheet Data    Labs for ITP Cardiac and Pulmonary Rehab Latest Ref Rng 03/30/2010 03/24/2011 04/18/2012 06/05/2013 10/22/2015  Cholestrol 0 - 200 mg/dL 144 162 130 158 138   LDLCALC 0 - 99 mg/dL - 98 77 96 98   LDLDIRECT - 88.7 - - - -   HDL >40 mg/dL 26.00(L) 31.90(L) 32.70(L) 32.70(L) 25(L)   Trlycerides <150 mg/dL 215.0(H) 159.0(H) 102.0 145.0 75   Hemoglobin A1c 4.6 - 6.5 % 6.7(H) 6.1 - 6.7(H) -      Capillary Blood Glucose: Lab Results  Component Value Date   GLUCAP 148* 09/14/2010   GLUCAP 114* 07/24/2010     ADL UCSD:   Pulmonary Function Assessment:     Pulmonary Function Assessment - 02/20/16 1126    Breath   Bilateral Breath Sounds Decreased   Shortness of Breath Yes;Fear of Shortness of Breath;Limiting activity;Panic with Shortness of Breath      Exercise Target Goals:    Exercise Program Goal: Individual exercise prescription set with THRR, safety &  activity barriers. Participant demonstrates ability to understand and report RPE using BORG scale, to self-measure pulse accurately, and to acknowledge the importance of the exercise prescription.  Exercise Prescription Goal: Starting with aerobic activity 30 plus minutes a day, 3 days per week for initial exercise prescription. Provide home exercise prescription and guidelines that participant acknowledges understanding prior to discharge.  Activity Barriers & Risk Stratification:     Activity Barriers & Cardiac Risk Stratification - 02/20/16 1125    Activity Barriers & Cardiac Risk Stratification   Activity Barriers Deconditioning;Shortness of Breath;Back Problems      6 Minute Walk:     6 Minute Walk      02/24/16 1642       6 Minute Walk   Phase Initial     Distance 400 feet     Walk Time 3.12 minutes     # of Rest Breaks 2     MPH 0.93     METS 0.93     RPE 13     Perceived Dyspnea  3     VO2 Peak 3.23     Symptoms No     Resting HR 90 bpm     Resting BP 122/60 mmHg     Max Ex. HR 105 bpm     Max Ex. BP 150/68 mmHg     Interval HR   Baseline HR 90     2 Minute HR 90     4 Minute HR 100     6 Minute HR 105     Interval Heart Rate? Yes     Interval Oxygen   Interval Oxygen? Yes     Baseline Oxygen Saturation % 87 %     Baseline Liters of Oxygen 2 L     1 Minute Oxygen Saturation % 84 %     1 Minute Liters of Oxygen 2 L     2 Minute Oxygen Saturation % 84 %     2 Minute Liters of Oxygen 3 L     3 Minute Oxygen Saturation % 85 %     3 Minute Liters of Oxygen 3 L     4 Minute Oxygen Saturation % 82 %     4 Minute Liters of Oxygen 4 L     5 Minute Oxygen Saturation % 85 %     5 Minute Liters of Oxygen 4 L     6 Minute Oxygen Saturation % 82 %     6 Minute Liters of Oxygen 4 L     2 Minute Post Oxygen Saturation %  88 %     2 Minute Post Liters of Oxygen 4 L        Initial Exercise Prescription:     Initial Exercise Prescription - 02/26/16 0700    Date  of Initial Exercise Prescription   Date 02/26/16   Oxygen   Oxygen Continuous   Liters 4   Arm Ergometer   Level 1   Watts 13   Minutes 15   Track   Laps 3   Minutes 15   Prescription Details   Frequency (times per week) 2   Duration Progress to 45 minutes of aerobic exercise without signs/symptoms of physical distress   Intensity   THRR 40-80% of Max Heartrate 60-120   Ratings of Perceived Exertion 11-13   Perceived Dyspnea 0-4   Progression   Progression Continue progressive overload as per policy without signs/symptoms or physical distress.   Resistance Training   Training Prescription Yes   Weight blue bands   Reps 10-12      Perform Capillary Blood Glucose checks as needed.  Exercise Prescription Changes:   Exercise Comments:   Discharge Exercise Prescription (Final Exercise Prescription Changes):    Nutrition:  Target Goals: Understanding of nutrition guidelines, daily intake of sodium '1500mg'$ , cholesterol '200mg'$ , calories 30% from fat and 7% or less from saturated fats, daily to have 5 or more servings of fruits and vegetables.  Biometrics:     Pre Biometrics - 02/20/16 1203    Pre Biometrics   Grip Strength 35 kg       Nutrition Therapy Plan and Nutrition Goals:   Nutrition Discharge: Rate Your Plate Scores:   Psychosocial: Target Goals: Acknowledge presence or absence of depression, maximize coping skills, provide positive support system. Participant is able to verbalize types and ability to use techniques and skills needed for reducing stress and depression.  Initial Review & Psychosocial Screening:     Initial Psych Review & Screening - 02/20/16 1126    Initial Review   Current issues with Current Depression;Current Anxiety/Panic   Family Dynamics   Good Support System? Yes   Barriers   Psychosocial barriers to participate in program The patient should benefit from training in stress management and relaxation.   Screening  Interventions   Interventions Encouraged to exercise      Quality of Life Scores:   PHQ-9:     Recent Review Flowsheet Data    Depression screen [facility] 2/9 02/20/2016 01/28/2016 01/28/2016 11/13/2015 06/05/2013   Decreased Interest 3 0 0 0 0   Down, Depressed, Hopeless '1 1 1 '$ 0 0   PHQ - 2 Score '4 1 1 '$ 0 0   Altered sleeping 1 - - - -   Tired, decreased energy 3 - - - -   Change in appetite 0 - - - -   Trouble concentrating 3 - - - -   Moving slowly or fidgety/restless 0 - - - -   Suicidal thoughts 0 - - - -   PHQ-9 Score 11 - - - -   Difficult doing work/chores Somewhat difficult - - - -      Psychosocial Evaluation and Intervention:     Psychosocial Evaluation - 02/20/16 1129    Psychosocial Evaluation & Interventions   Interventions Encouraged to exercise with the program and follow exercise prescription      Psychosocial Re-Evaluation:  Education: Education Goals: Education classes will be provided on a weekly basis, covering required topics. Participant will state understanding/return demonstration of topics presented.  Learning Barriers/Preferences:     Learning Barriers/Preferences - 02/20/16 1126    Learning Barriers/Preferences   Learning Barriers None   Learning Preferences Individual Instruction;Skilled Demonstration      Education Topics: Risk Factor Reduction:  -Group instruction that is supported by a PowerPoint presentation. Instructor discusses the definition of a risk factor, different risk factors for pulmonary disease, and how the heart and lungs work together.     Nutrition for Pulmonary Patient:  -Group instruction provided by PowerPoint slides, verbal discussion, and written materials to support subject matter. The instructor gives an explanation and review of healthy diet recommendations, which includes a discussion on weight management, recommendations for fruit and vegetable consumption, as well as protein, fluid, caffeine, fiber, sodium, sugar,  and alcohol. Tips for eating when patients are short of breath are discussed.   Pursed Lip Breathing:  -Group instruction that is supported by demonstration and informational handouts. Instructor discusses the benefits of pursed lip and diaphragmatic breathing and detailed demonstration on how to preform both.     Oxygen Safety:  -Group instruction provided by PowerPoint, verbal discussion, and written material to support subject matter. There is an overview of "What is Oxygen" and "Why do we need it".  Instructor also reviews how to create a safe environment for oxygen use, the importance of using oxygen as prescribed, and the risks of noncompliance. There is a brief discussion on traveling with oxygen and resources the patient may utilize.   Oxygen Equipment:  -Group instruction provided by [facility] Staff utilizing handouts, written materials, and equipment demonstrations.   Signs and Symptoms:  -Group instruction provided by written material and verbal discussion to support subject matter. Warning signs and symptoms of infection, stroke, and heart attack are reviewed and when to call the physician/911 reinforced. Tips for preventing the spread of infection discussed.   Advanced Directives:  -Group instruction provided by verbal instruction and written material to support subject matter. Instructor reviews Advanced Directive laws and proper instruction for filling out document.   Pulmonary Video:  -Group video education that reviews the importance of medication and oxygen compliance, exercise, good nutrition, pulmonary hygiene, and pursed lip and diaphragmatic breathing for the pulmonary patient.   Exercise for the Pulmonary Patient:  -Group instruction that is supported by a PowerPoint presentation. Instructor discusses benefits of exercise, core components of exercise, frequency, duration, and intensity of an exercise routine, importance of utilizing pulse oximetry during exercise,  safety while exercising, and options of places to exercise outside of rehab.     Pulmonary Medications:  -Verbally interactive group education provided by instructor with focus on inhaled medications and proper administration.   Anatomy and Physiology of the Respiratory System and Intimacy:  -Group instruction provided by PowerPoint, verbal discussion, and written material to support subject matter. Instructor reviews respiratory cycle and anatomical components of the respiratory system and their functions. Instructor also reviews differences in obstructive and restrictive respiratory diseases with examples of each. Intimacy, Sex, and Sexuality differences are reviewed with a discussion on how relationships can change when diagnosed with pulmonary disease. Common sexual concerns are reviewed.   Knowledge Questionnaire Score:   Core Components/Risk Factors/Patient Goals at Admission:     Personal Goals and Risk Factors at Admission - 02/20/16 1130    Core Components/Risk Factors/Patient Goals on Admission    Weight Management (p) Obesity;Weight Loss;Yes   Intervention (p) Obesity: Provide education and appropriate resources to help participant work on and attain dietary goals.;Weight Management/Obesity: Establish reasonable short term  and long term weight goals.   Expected Outcomes (p) Short Term: Continue to assess and modify interventions until short term weight is achieved;Long Term: Adherence to nutrition and physical activity/exercise program aimed toward attainment of established weight goal;Weight Loss: Understanding of general recommendations for a balanced deficit meal plan, which promotes 1-2 lb weight loss per week and includes a negative energy balance of [phone] kcal/d;Understanding recommendations for meals to include 15-35% energy as protein, 25-35% energy from fat, 35-60% energy from carbohydrates, less than '200mg'$  of dietary cholesterol, 20-35 gm of total fiber  daily;Understanding of distribution of calorie intake throughout the day with the consumption of 4-5 meals/snacks   Sedentary (p) Yes   Intervention (p) Provide advice, education, support and counseling about physical activity/exercise needs.;Develop an individualized exercise prescription for aerobic and resistive training based on initial evaluation findings, risk stratification, comorbidities and participant's personal goals.   Expected Outcomes (p) Achievement of increased cardiorespiratory fitness and enhanced flexibility, muscular endurance and strength shown through measurements of functional capacity and personal statement of participant.   Increase Strength and Stamina (p) Yes   Intervention (p) Provide advice, education, support and counseling about physical activity/exercise needs.;Develop an individualized exercise prescription for aerobic and resistive training based on initial evaluation findings, risk stratification, comorbidities and participant's personal goals.   Expected Outcomes (p) Achievement of increased cardiorespiratory fitness and enhanced flexibility, muscular endurance and strength shown through measurements of functional capacity and personal statement of participant.   Improve shortness of breath with ADL's (p) Yes   Intervention (p) Provide education, individualized exercise plan and daily activity instruction to help decrease symptoms of SOB with activities of daily living.   Expected Outcomes (p) Short Term: Achieves a reduction of symptoms when performing activities of daily living.   Develop more efficient breathing techniques such as purse lipped breathing and diaphragmatic breathing; and practicing self-pacing with activity (p) Yes   Intervention (p) Provide education, demonstration and support about specific breathing techniuqes utilized for more efficient breathing. Include techniques such as pursed lipped breathing, diaphragmatic breathing and self-pacing activity.    Expected Outcomes (p) Short Term: Participant will be able to demonstrate and use breathing techniques as needed throughout daily activities.   Increase knowledge of respiratory medications and ability to use respiratory devices properly  (p) Yes   Intervention (p) Provide education and demonstration as needed of appropriate use of medications, inhalers, and oxygen therapy.   Expected Outcomes (p) Short Term: Achieves understanding of medications use. Understands that oxygen is a medication prescribed by physician. Demonstrates appropriate use of inhaler and oxygen therapy.   Hypertension (p) Yes   Intervention (p) Provide education on lifestyle modifcations including regular physical activity/exercise, weight management, moderate sodium restriction and increased consumption of fresh fruit, vegetables, and low fat dairy, alcohol moderation, and smoking cessation.;Monitor prescription use compliance.   Expected Outcomes (p) Short Term: Continued assessment and intervention until BP is < 140/52m HG in hypertensive participants. < 130/826mHG in hypertensive participants with diabetes, heart failure or chronic kidney disease.;Long Term: Maintenance of blood pressure at goal levels.   Stress (p) Yes   Intervention (p) Offer individual and/or small group education and counseling on adjustment to heart disease, stress management and health-related lifestyle change. Teach and support self-help strategies.;Refer participants experiencing significant psychosocial distress to appropriate mental health specialists for further evaluation and treatment. When possible, include family members and significant others in education/counseling sessions.   Expected Outcomes (p) Short Term: Participant demonstrates changes in health-related behavior, relaxation and  other stress management skills, ability to obtain effective social support, and compliance with psychotropic medications if prescribed.;Long Term: Emotional  wellbeing is indicated by absence of clinically significant psychosocial distress or social isolation.   Personal Goal Other (p) Yes      Core Components/Risk Factors/Patient Goals Review:    Core Components/Risk Factors/Patient Goals at Discharge (Final Review):    ITP Comments:   Comments: Service time is from 0940 to [place] 70 y.o. male Pulmonary Rehab Orientation Note Patient arrived today in Cardiac and Pulmonary Rehab for orientation to Pulmonary Rehab. He was transported from [employer] via wheel chair, accompanied by his wife. He does carry portable oxygen. Per pt, he uses oxygen continuously;2 liters at rest, 3-5 liters with exertion, and 5 liters at night. Color good, skin warm and dry. Patient is oriented to time and place. Patient's medical history, psychosocial health, and medications reviewed. Psychosocial assessment reveals pt lives with their spouse. Pt is currently retired. Pt hobbies include fishing, however he has not been fishing lately because of his deconditioning. Pt reports his stress level is moderate. Areas of stress/anxiety include his health and family. His mother-in-law requires constant attention because of recent falls, but she insists on remaining independent at home. His daughter is struggling with "a nervous breakdown" due to divorce and discusses her problems daily with her parents.  Pt does not exhibit exhibits signs of depression, but verbalizes that he is probably depresses. He feels that is why he has a lack of interest in social activities.Signs of depression include anxiety and irritability and difficulty falling asleep. PHQ2/9 score 4/11. Patient encouraged to speak with PCP about depression symptoms. Declined counseling. Pt shows fair  coping skills with positive outlook . He was offered emotional support and reassurance. Will continue to monitor and evaluate progress toward psychosocial goal(s) of decreasing stress and improving  socialization. Physical assessment reveals heart rate is normal, S1S2 present. Breath sounds diminished throughout. Grip strength equal, strong. Distal pulses palpable. 2+ pitting edema noted knees to toes bilat, L>R. Patient reports he does take medications as prescribed. Patient states he follows a Regular diet. The patient reports no specific efforts to gain or lose weight. He has unintentionally gained a significant amount of weight since his most recent hospitalization in December 2016. He also attributes most of the weight gain to his sedentary lifestyle and smoking cessation. Patient's weight will be monitored closely. Demonstration and practice of PLB using pulse oximeter. Patient able to return demonstration satisfactorily. Safety and hand hygiene in the exercise area reviewed with patient. Patient voices understanding of the information reviewed. Department expectations discussed with patient and achievable goals were set. The patient shows enthusiasm about attending the program and we look forward to working with this nice gentleman. The patient is scheduled for a 6 min walk test on Tuesday 4/11 at 3:30 and to begin exercise on Tuesday 4/18 in the 1:30 class.

## 2016-02-20 NOTE — Telephone Encounter (Signed)
Spoke with Jeani Hawking at [facility]-states they need O2 sats from order placed 02-03-16. Pt is aware that we will need to have him come by the office to have O2 levels checked. Pt then stated that he has 6MW schedule at pulmonary rehab at [facility] Tuesday next week and see if we could use those numbers.   Molly at Pulmonary Rehab is aware that we need the sats on patient for [facility]. She will fax results to my attention on Tuesday. Will forward to [facility] once received. Will hold in my basket until complete.

## 2016-02-24 ENCOUNTER — Encounter (HOSPITAL_COMMUNITY)
Admission: RE | Admit: 2016-02-24 | Discharge: 2016-02-24 | Disposition: A | Discharge status: Home / Self Care | Payer: Medicare Other | Source: Ambulatory Visit | Attending: Pulmonary Disease | Admitting: Pulmonary Disease

## 2016-02-24 DIAGNOSIS — Z79899 Other long term (current) drug therapy: Secondary | ICD-10-CM | POA: Diagnosis not present

## 2016-02-24 DIAGNOSIS — Z7982 Long term (current) use of aspirin: Secondary | ICD-10-CM | POA: Diagnosis not present

## 2016-02-24 DIAGNOSIS — I2510 Atherosclerotic heart disease of native coronary artery without angina pectoris: Secondary | ICD-10-CM | POA: Diagnosis not present

## 2016-02-24 DIAGNOSIS — Z87891 Personal history of nicotine dependence: Secondary | ICD-10-CM | POA: Diagnosis not present

## 2016-02-24 DIAGNOSIS — J438 Other emphysema: Secondary | ICD-10-CM | POA: Diagnosis not present

## 2016-02-24 DIAGNOSIS — J449 Chronic obstructive pulmonary disease, unspecified: Secondary | ICD-10-CM | POA: Diagnosis not present

## 2016-02-24 NOTE — Telephone Encounter (Signed)
[name] [phone] at Pulmonary rehab is calling stating she did the walk test and is requesting a call back.

## 2016-02-24 NOTE — Telephone Encounter (Signed)
Called [place] with Pulm rehab, LM x 1 to return call 02/25/16

## 2016-02-25 NOTE — Telephone Encounter (Signed)
Spoke with [name]-she is aware that O2 sats have been taken care of and faxing them over to her.   Faxed to: [phone].

## 2016-02-25 NOTE — Telephone Encounter (Signed)
Called and spoke with [name] at Pulmonary Rehab, she states that [name] does not work on Wednesdays and she is not aware of what [name] was calling about.  She took number and left message for [name] to call us back tomorrow.

## 2016-02-26 ENCOUNTER — Ambulatory Visit: Payer: Self-pay | Admitting: *Deleted

## 2016-02-26 ENCOUNTER — Telehealth: Payer: Self-pay | Admitting: Pulmonary Disease

## 2016-02-26 ENCOUNTER — Encounter (HOSPITAL_COMMUNITY): Payer: Self-pay | Admitting: *Deleted

## 2016-02-26 NOTE — Telephone Encounter (Signed)
Per SN: Keep O2 sats >85%, ok to increase O2 to 5lpm prn. Stop exercise if O2 sat gets <85%.  Called and spoke to [facility] with Pulmonary Rehab and informed her of the recs per SN. Molly verbalized understanding and denied any further questions or concerns at this time.

## 2016-02-26 NOTE — Telephone Encounter (Signed)
Spoke with Molly'[email or web address]  regarding pt's walk test. She states that during the walk test she bumped him up to 4L and his O2 level was only at 82%. She needs to know what his O2 guidelines are and what his ranges should be. She is faxing over the walk test but a preliminary is in chart.   SN please advise. Thank you!

## 2016-03-02 ENCOUNTER — Encounter (HOSPITAL_COMMUNITY)
Admission: RE | Admit: 2016-03-02 | Discharge: 2016-03-02 | Disposition: A | Discharge status: Home / Self Care | Payer: Medicare Other | Source: Ambulatory Visit | Attending: Pulmonary Disease | Admitting: Pulmonary Disease

## 2016-03-02 VITALS — Wt 233.0 lb

## 2016-03-02 DIAGNOSIS — Z87891 Personal history of nicotine dependence: Secondary | ICD-10-CM | POA: Diagnosis not present

## 2016-03-02 DIAGNOSIS — Z79899 Other long term (current) drug therapy: Secondary | ICD-10-CM | POA: Diagnosis not present

## 2016-03-02 DIAGNOSIS — Z7982 Long term (current) use of aspirin: Secondary | ICD-10-CM | POA: Diagnosis not present

## 2016-03-02 DIAGNOSIS — J438 Other emphysema: Secondary | ICD-10-CM

## 2016-03-02 DIAGNOSIS — J449 Chronic obstructive pulmonary disease, unspecified: Secondary | ICD-10-CM | POA: Diagnosis not present

## 2016-03-02 DIAGNOSIS — I2510 Atherosclerotic heart disease of native coronary artery without angina pectoris: Secondary | ICD-10-CM | POA: Diagnosis not present

## 2016-03-02 NOTE — Progress Notes (Signed)
Pulmonary Individual Treatment Plan  Patient Details  Name: [name] MRN: [id number] Date of Birth: [birth year] Referring Provider:    Initial Encounter Date:       Pulmonary Rehab Walk Test from 02/24/2016 in [facility]   Date  02/26/16      Visit Diagnosis: Other emphysema ([place])  Patient's Home Medications on Admission:   Current outpatient prescriptions:  .  ADVAIR DISKUS 250-50 MCG/DOSE AEPB, INHALE 1 PUFF BY MOUTH EVERY 12 HOURS, Disp: 60 each, Rfl: 11 .  albuterol (PROVENTIL HFA;VENTOLIN HFA) 108 (90 BASE) MCG/ACT inhaler, Inhale 2 puffs into the lungs 4 (four) times daily as needed for wheezing (for use when not at home / unable to use nebulizer)., Disp: 1 Inhaler, Rfl: 0 .  albuterol (PROVENTIL) (2.5 MG/3ML) 0.083% nebulizer solution, Take 3 mLs (2.5 mg total) by nebulization 3 (three) times daily., Disp: 360 mL, Rfl: 3 .  ALPRAZolam (XANAX) 0.5 MG tablet, Take 0.5 mg by mouth 3 (three) times daily as needed for anxiety or sleep., Disp: , Rfl:  .  amLODipine (NORVASC) 10 MG tablet, TAKE 1 TABLET (10 MG TOTAL) BY MOUTH DAILY., Disp: 90 tablet, Rfl: 3 .  aspirin 81 MG tablet, Take 81 mg by mouth at bedtime. , Disp: , Rfl:  .  Flaxseed, Linseed, (FLAXSEED OIL) 1000 MG CAPS, Take 1 capsule by mouth daily.  , Disp: , Rfl:  .  ibuprofen (ADVIL,MOTRIN) 600 MG tablet, Take 600 mg by mouth every 6 (six) hours as needed for moderate pain., Disp: , Rfl:  .  lisinopril (PRINIVIL,ZESTRIL) 10 MG tablet, Take 1 tablet (10 mg total) by mouth daily., Disp: 30 tablet, Rfl: 0 .  METOPROLOL SUCCINATE ER PO, Take by mouth. Pt unsure of MG, Pt states he is taking 2 tabs in morning and 1 tab in evening., Disp: , Rfl:  .  Multiple Vitamin (MULTIVITAMIN) capsule, Take 1 capsule by mouth daily.  , Disp: , Rfl:  .  nitroGLYCERIN (NITROSTAT) 0.4 MG SL tablet, Place 1 tablet (0.4 mg total) under the tongue every 5 (five) minutes as needed for chest pain., Disp: 25 tablet,  Rfl: 3 .  Omega-3 Fatty Acids (FISH OIL) 1200 MG CAPS, Take 1 capsule by mouth daily.  , Disp: , Rfl:  .  simvastatin (ZOCOR) 20 MG tablet, Take 1 tablet (20 mg total) by mouth daily at 6 PM., Disp: 30 tablet, Rfl: 2 .  sodium chloride (OCEAN) 0.65 % SOLN nasal spray, Place 1 spray into both nostrils as needed for congestion., Disp: , Rfl: 0 .  SPIRIVA HANDIHALER 18 MCG inhalation capsule, INHALE CONTENTS OF 1 CAPSULE  DAILY, Disp: 90 capsule, Rfl: 3  Past Medical History: Past Medical History  Diagnosis Date  . History of sinusitis   . Epistaxis   . COPD (chronic obstructive pulmonary disease) ([place])   . Cigarette smoker   . Hypertension   . CAD (coronary artery disease)   . Hypercholesteremia   . Borderline diabetes mellitus   . Obesity   . History of colonic polyps 2004    hyperplastic   . DJD (degenerative joint disease)   . Anxiety   . Bladder cancer ([place]) 2011  . Emphysema of lung ([place])     Tobacco Use: History  Smoking status  . Former Smoker -- 2.00 packs/day for 45 years  . Types: Cigarettes  . Quit date: 10/17/2015  Smokeless tobacco  . Never Used    Labs:  Recent Review Flowsheet Data    Labs for ITP Cardiac and Pulmonary Rehab Latest Ref Rng 03/30/2010 03/24/2011 04/18/2012 06/05/2013 10/22/2015   Cholestrol 0 - 200 mg/dL 144 162 130 158 138   LDLCALC 0 - 99 mg/dL - 98 77 96 98   LDLDIRECT - 88.7 - - - -   HDL >40 mg/dL 26.00(L) 31.90(L) 32.70(L) 32.70(L) 25(L)   Trlycerides <150 mg/dL 215.0(H) 159.0(H) 102.0 145.0 75   Hemoglobin A1c 4.6 - 6.5 % 6.7(H) 6.1 - 6.7(H) -      Capillary Blood Glucose: Lab Results  Component Value Date   GLUCAP 148* 09/14/2010   GLUCAP 114* 07/24/2010     ADL UCSD:     Pulmonary Assessment Scores      02/26/16 1159       ADL UCSD   SOB Score total 88        Pulmonary Function Assessment:     Pulmonary Function Assessment - 02/20/16 1126    Breath   Bilateral Breath Sounds Decreased   Shortness of Breath  Yes;Fear of Shortness of Breath;Limiting activity;Panic with Shortness of Breath      Exercise Target Goals:    Exercise Program Goal: Individual exercise prescription set with THRR, safety & activity barriers. Participant demonstrates ability to understand and report RPE using BORG scale, to self-measure pulse accurately, and to acknowledge the importance of the exercise prescription.  Exercise Prescription Goal: Starting with aerobic activity 30 plus minutes a day, 3 days per week for initial exercise prescription. Provide home exercise prescription and guidelines that participant acknowledges understanding prior to discharge.  Activity Barriers & Risk Stratification:     Activity Barriers & Cardiac Risk Stratification - 02/20/16 1125    Activity Barriers & Cardiac Risk Stratification   Activity Barriers Deconditioning;Shortness of Breath;Back Problems      6 Minute Walk:     6 Minute Walk      02/24/16 1642       6 Minute Walk   Phase Initial     Distance 400 feet     Walk Time 3.12 minutes     # of Rest Breaks 2     MPH 0.93     METS 0.93     RPE 13     Perceived Dyspnea  3     VO2 Peak 3.23     Symptoms No     Resting HR 90 bpm     Resting BP 122/60 mmHg     Max Ex. HR 105 bpm     Max Ex. BP 150/68 mmHg     Interval HR   Baseline HR 90     2 Minute HR 90     4 Minute HR 100     6 Minute HR 105     Interval Heart Rate? Yes     Interval Oxygen   Interval Oxygen? Yes     Baseline Oxygen Saturation % 87 %     Baseline Liters of Oxygen 2 L     1 Minute Oxygen Saturation % 84 %     1 Minute Liters of Oxygen 2 L     2 Minute Oxygen Saturation % 84 %     2 Minute Liters of Oxygen 3 L     3 Minute Oxygen Saturation % 85 %     3 Minute Liters of Oxygen 3 L     4 Minute Oxygen Saturation % 82 %     4 Minute Liters  of Oxygen 4 L     5 Minute Oxygen Saturation % 85 %     5 Minute Liters of Oxygen 4 L     6 Minute Oxygen Saturation % 82 %     6 Minute Liters of  Oxygen 4 L     2 Minute Post Oxygen Saturation % 88 %     2 Minute Post Liters of Oxygen 4 L        Initial Exercise Prescription:     Initial Exercise Prescription - 02/26/16 0700    Date of Initial Exercise RX and Referring Provider   Date 02/26/16   Oxygen   Oxygen Continuous   Liters 4   Arm Ergometer   Level 1   Watts 13   Minutes 15   Track   Laps 3   Minutes 15   Prescription Details   Frequency (times per week) 2   Duration Progress to 45 minutes of aerobic exercise without signs/symptoms of physical distress   Intensity   THRR 40-80% of Max Heartrate 60-120   Ratings of Perceived Exertion 11-13   Perceived Dyspnea 0-4   Progression   Progression Continue progressive overload as per policy without signs/symptoms or physical distress.   Resistance Training   Training Prescription Yes   Weight blue bands   Reps 10-12      Perform Capillary Blood Glucose checks as needed.  Exercise Prescription Changes:      Exercise Prescription Changes      03/02/16 1500 03/04/16 1500         Response to Exercise   Blood Pressure (Admit) 120/66 mmHg 130/66 mmHg      Blood Pressure (Exercise) 130/70 mmHg 140/70 mmHg      Blood Pressure (Exit) 124/56 mmHg 120/64 mmHg      Heart Rate (Admit) 72 bpm 72 bpm      Heart Rate (Exercise) 106 bpm 88 bpm      Heart Rate (Exit) 82 bpm 75 bpm      Oxygen Saturation (Admit) 91 % 88 %      Oxygen Saturation (Exercise) 80 % 85 %      Oxygen Saturation (Exit) 95 % 93 %      Rating of Perceived Exertion (Exercise) 15 13      Perceived Dyspnea (Exercise) 2 2      Duration Progress to 45 minutes of aerobic exercise without signs/symptoms of physical distress Progress to 45 minutes of aerobic exercise without signs/symptoms of physical distress      Intensity Other (comment)  40-80% HRR THRR unchanged      Progression   Progression Continue to progress workloads to maintain intensity without signs/symptoms of physical distress.  Continue to progress workloads to maintain intensity without signs/symptoms of physical distress.      Resistance Training   Training Prescription Yes Yes      Weight Blue bands blue bands      Reps 10-12 10-12      Oxygen   Oxygen Continuous Continuous      Liters 4 4      NuStep   Level 1       Minutes 15       METs 1.5       Arm Ergometer   Level 1 1      Minutes 15 15      Track   Laps 3 4      Minutes 15 15  Exercise Comments:      Exercise Comments      03/04/16 0914           Exercise Comments Patient is starting off well with exercise. Will cont. to monitor progression.           Discharge Exercise Prescription (Final Exercise Prescription Changes):     Exercise Prescription Changes - 03/04/16 1500    Response to Exercise   Blood Pressure (Admit) 130/66 mmHg   Blood Pressure (Exercise) 140/70 mmHg   Blood Pressure (Exit) 120/64 mmHg   Heart Rate (Admit) 72 bpm   Heart Rate (Exercise) 88 bpm   Heart Rate (Exit) 75 bpm   Oxygen Saturation (Admit) 88 %   Oxygen Saturation (Exercise) 85 %   Oxygen Saturation (Exit) 93 %   Rating of Perceived Exertion (Exercise) 13   Perceived Dyspnea (Exercise) 2   Duration Progress to 45 minutes of aerobic exercise without signs/symptoms of physical distress   Intensity THRR unchanged   Progression   Progression Continue to progress workloads to maintain intensity without signs/symptoms of physical distress.   Resistance Training   Training Prescription Yes   Weight blue bands   Reps 10-12   Oxygen   Oxygen Continuous   Liters 4   Arm Ergometer   Level 1   Minutes 15   Track   Laps 4   Minutes 15       Nutrition:  Target Goals: Understanding of nutrition guidelines, daily intake of sodium <1532m, cholesterol <2052m calories 30% from fat and 7% or less from saturated fats, daily to have 5 or more servings of fruits and vegetables.  Biometrics:     Pre Biometrics - 02/20/16 1203    Pre  Biometrics   Grip Strength 35 kg       Nutrition Therapy Plan and Nutrition Goals:   Nutrition Discharge: Rate Your Plate Scores:   Psychosocial: Target Goals: Acknowledge presence or absence of depression, maximize coping skills, provide positive support system. Participant is able to verbalize types and ability to use techniques and skills needed for reducing stress and depression.  Initial Review & Psychosocial Screening:     Initial Psych Review & Screening - 02/20/16 1126    Initial Review   Current issues with Current Depression;Current Anxiety/Panic   Family Dynamics   Good Support System? Yes   Barriers   Psychosocial barriers to participate in program The patient should benefit from training in stress management and relaxation.   Screening Interventions   Interventions Encouraged to exercise      Quality of Life Scores:     Quality of Life - 02/26/16 1159    Quality of Life Scores   Health/Function Pre 22.4 %   Socioeconomic Pre 28 %   Psych/Spiritual Pre 22.29 %   Family Pre 19.5 %   GLOBAL Pre 23.49 %      PHQ-9:     Recent Review Flowsheet Data    [facility] 02/20/2016 01/28/2016 01/28/2016 11/13/2015 06/05/2013   Decreased Interest 3 0 0 0 0   Down, Depressed, Hopeless _0 0 0   PHQ - 2 Score _1 0 0   Altered sleeping 1 - - - -   Tired, decreased energy 3 - - - -   Change in appetite 0 - - - -   Trouble concentrating 3 - - - -   Moving slowly or fidgety/restless 0 - - - -   Suicidal  thoughts 0 - - - -   PHQ-9 Score 11 - - - -   Difficult doing work/chores Somewhat difficult - - - -      Psychosocial Evaluation and Intervention:     Psychosocial Evaluation - 02/20/16 1129    Psychosocial Evaluation & Interventions   Interventions Encouraged to exercise with the program and follow exercise prescription      Psychosocial Re-Evaluation:  Education: Education Goals: Education classes will be provided on a weekly basis,  covering required topics. Participant will state understanding/return demonstration of topics presented.  Learning Barriers/Preferences:     Learning Barriers/Preferences - 02/20/16 1126    Learning Barriers/Preferences   Learning Barriers None   Learning Preferences Individual Instruction;Skilled Demonstration      Education Topics: Risk Factor Reduction:  -Group instruction that is supported by a PowerPoint presentation. Instructor discusses the definition of a risk factor, different risk factors for pulmonary disease, and how the heart and lungs work together.     Nutrition for Pulmonary Patient:  -Group instruction provided by PowerPoint slides, verbal discussion, and written materials to support subject matter. The instructor gives an explanation and review of healthy diet recommendations, which includes a discussion on weight management, recommendations for fruit and vegetable consumption, as well as protein, fluid, caffeine, fiber, sodium, sugar, and alcohol. Tips for eating when patients are short of breath are discussed.   Pursed Lip Breathing:  -Group instruction that is supported by demonstration and informational handouts. Instructor discusses the benefits of pursed lip and diaphragmatic breathing and detailed demonstration on how to preform both.     Oxygen Safety:  -Group instruction provided by PowerPoint, verbal discussion, and written material to support subject matter. There is an overview of "What is Oxygen" and "Why do we need it".  Instructor also reviews how to create a safe environment for oxygen use, the importance of using oxygen as prescribed, and the risks of noncompliance. There is a brief discussion on traveling with oxygen and resources the patient may utilize.   Oxygen Equipment:  -Group instruction provided by [facility] Staff utilizing handouts, written materials, and equipment demonstrations.   Signs and Symptoms:  -Group instruction provided by  written material and verbal discussion to support subject matter. Warning signs and symptoms of infection, stroke, and heart attack are reviewed and when to call the physician/911 reinforced. Tips for preventing the spread of infection discussed.   Advanced Directives:  -Group instruction provided by verbal instruction and written material to support subject matter. Instructor reviews Advanced Directive laws and proper instruction for filling out document.   Pulmonary Video:  -Group video education that reviews the importance of medication and oxygen compliance, exercise, good nutrition, pulmonary hygiene, and pursed lip and diaphragmatic breathing for the pulmonary patient.   Exercise for the Pulmonary Patient:  -Group instruction that is supported by a PowerPoint presentation. Instructor discusses benefits of exercise, core components of exercise, frequency, duration, and intensity of an exercise routine, importance of utilizing pulse oximetry during exercise, safety while exercising, and options of places to exercise outside of rehab.     Pulmonary Medications:  -Verbally interactive group education provided by instructor with focus on inhaled medications and proper administration.   Anatomy and Physiology of the Respiratory System and Intimacy:  -Group instruction provided by PowerPoint, verbal discussion, and written material to support subject matter. Instructor reviews respiratory cycle and anatomical components of the respiratory system and their functions. Instructor also reviews differences in obstructive and restrictive respiratory diseases with  examples of each. Intimacy, Sex, and Sexuality differences are reviewed with a discussion on how relationships can change when diagnosed with pulmonary disease. Common sexual concerns are reviewed.   Knowledge Questionnaire Score:     Knowledge Questionnaire Score - 02/26/16 1158    Knowledge Questionnaire Score   Pre Score 12/13       Core Components/Risk Factors/Patient Goals at Admission:     Personal Goals and Risk Factors at Admission - 02/20/16 1130    Core Components/Risk Factors/Patient Goals on Admission    Weight Management (p) Obesity;Weight Loss;Yes   Intervention (p) Obesity: Provide education and appropriate resources to help participant work on and attain dietary goals.;Weight Management/Obesity: Establish reasonable short term and long term weight goals.   Expected Outcomes (p) Short Term: Continue to assess and modify interventions until short term weight is achieved;Long Term: Adherence to nutrition and physical activity/exercise program aimed toward attainment of established weight goal;Weight Loss: Understanding of general recommendations for a balanced deficit meal plan, which promotes 1-2 lb weight loss per week and includes a negative energy balance of [phone] kcal/d;Understanding recommendations for meals to include 15-35% energy as protein, 25-35% energy from fat, 35-60% energy from carbohydrates, less than 24m of dietary cholesterol, 20-35 gm of total fiber daily;Understanding of distribution of calorie intake throughout the day with the consumption of 4-5 meals/snacks   Sedentary (p) Yes   Intervention (p) Provide advice, education, support and counseling about physical activity/exercise needs.;Develop an individualized exercise prescription for aerobic and resistive training based on initial evaluation findings, risk stratification, comorbidities and participant's personal goals.   Expected Outcomes (p) Achievement of increased cardiorespiratory fitness and enhanced flexibility, muscular endurance and strength shown through measurements of functional capacity and personal statement of participant.   Increase Strength and Stamina (p) Yes   Intervention (p) Provide advice, education, support and counseling about physical activity/exercise needs.;Develop an individualized exercise prescription for  aerobic and resistive training based on initial evaluation findings, risk stratification, comorbidities and participant's personal goals.   Expected Outcomes (p) Achievement of increased cardiorespiratory fitness and enhanced flexibility, muscular endurance and strength shown through measurements of functional capacity and personal statement of participant.   Improve shortness of breath with ADL's (p) Yes   Intervention (p) Provide education, individualized exercise plan and daily activity instruction to help decrease symptoms of SOB with activities of daily living.   Expected Outcomes (p) Short Term: Achieves a reduction of symptoms when performing activities of daily living.   Develop more efficient breathing techniques such as purse lipped breathing and diaphragmatic breathing; and practicing self-pacing with activity (p) Yes   Intervention (p) Provide education, demonstration and support about specific breathing techniuqes utilized for more efficient breathing. Include techniques such as pursed lipped breathing, diaphragmatic breathing and self-pacing activity.   Expected Outcomes (p) Short Term: Participant will be able to demonstrate and use breathing techniques as needed throughout daily activities.   Increase knowledge of respiratory medications and ability to use respiratory devices properly  (p) Yes   Intervention (p) Provide education and demonstration as needed of appropriate use of medications, inhalers, and oxygen therapy.   Expected Outcomes (p) Short Term: Achieves understanding of medications use. Understands that oxygen is a medication prescribed by physician. Demonstrates appropriate use of inhaler and oxygen therapy.   Hypertension (p) Yes   Intervention (p) Provide education on lifestyle modifcations including regular physical activity/exercise, weight management, moderate sodium restriction and increased consumption of fresh fruit, vegetables, and low fat dairy, alcohol moderation,  and  smoking cessation.;Monitor prescription use compliance.   Expected Outcomes (p) Short Term: Continued assessment and intervention until BP is < 140/59m HG in hypertensive participants. < 130/876mHG in hypertensive participants with diabetes, heart failure or chronic kidney disease.;Long Term: Maintenance of blood pressure at goal levels.   Stress (p) Yes   Intervention (p) Offer individual and/or small group education and counseling on adjustment to heart disease, stress management and health-related lifestyle change. Teach and support self-help strategies.;Refer participants experiencing significant psychosocial distress to appropriate mental health specialists for further evaluation and treatment. When possible, include family members and significant others in education/counseling sessions.   Expected Outcomes (p) Short Term: Participant demonstrates changes in health-related behavior, relaxation and other stress management skills, ability to obtain effective social support, and compliance with psychotropic medications if prescribed.;Long Term: Emotional wellbeing is indicated by absence of clinically significant psychosocial distress or social isolation.   Personal Goal Other (p) Yes      Core Components/Risk Factors/Patient Goals Review:    Core Components/Risk Factors/Patient Goals at Discharge (Final Review):    ITP Comments:   Comments: ITP REVIEW Pt is making expected progress toward personal goals after completing 1 sessions.   Recommend continued exercise, life style modification, education, and utilization of breathing texhniques to increase stamina and strength and decrease shortness of breath with exertion.

## 2016-03-02 NOTE — Progress Notes (Signed)
Daily Session Note  Patient Details  Name: [name] MRN: [id number] Date of Birth: 06/24/1946 Referring Provider:    Encounter Date: 03/02/2016  Check In:     Session Check In - 03/02/16 1419    Check-In   Location [facility]   Staff Present Joan Behrens, RN, BSN;Molly diVincenzo, MS, ACSM RCEP, Exercise Physiologist;Lisa Hughes, RN   Supervising physician immediately available to respond to emergencies [facility] immediately available   Physician(s) Dr. Merrell   Medication changes reported     No   Fall or balance concerns reported    No   Warm-up and Cool-down Performed as group-led instruction   Resistance Training Performed Yes   VAD Patient? No   Pain Assessment   Currently in Pain? No/denies   Multiple Pain Sites No      Capillary Blood Glucose: No results found for this or any previous visit (from the past 24 hour(s)).      Exercise Prescription Changes - 03/02/16 1500    Response to Exercise   Blood Pressure (Admit) 120/66 mmHg   Blood Pressure (Exercise) 130/70 mmHg   Blood Pressure (Exit) 124/56 mmHg   Heart Rate (Admit) 72 bpm   Heart Rate (Exercise) 106 bpm   Heart Rate (Exit) 82 bpm   Oxygen Saturation (Admit) 91 %   Oxygen Saturation (Exercise) 80 %   Oxygen Saturation (Exit) 95 %   Rating of Perceived Exertion (Exercise) 15   Perceived Dyspnea (Exercise) 2   Duration Progress to 45 minutes of aerobic exercise without signs/symptoms of physical distress   Intensity Other (comment)  40-80% HRR   Progression   Progression Continue to progress workloads to maintain intensity without signs/symptoms of physical distress.   Resistance Training   Training Prescription Yes   Weight Blue bands   Reps 10-12   Oxygen   Oxygen Continuous   Liters 4   NuStep   Level 1   Minutes 15   METs 1.5   Arm Ergometer   Level 1   Minutes 15   Track   Laps 3   Minutes 15     Goals Met:  Achieving weight loss Exercise  tolerated well Queuing for purse lip breathing No report of cardiac concerns or symptoms Strength training completed today  Goals Unmet:  Not Applicable  Comments: Service time is from 1330 to 1500   Dr. Wesam G. Yacoub is Medical Director for Pulmonary Rehab at [facility]. 

## 2016-03-04 ENCOUNTER — Telehealth: Payer: Self-pay | Admitting: Pulmonary Disease

## 2016-03-04 ENCOUNTER — Encounter (HOSPITAL_COMMUNITY)
Admission: RE | Admit: 2016-03-04 | Discharge: 2016-03-04 | Disposition: A | Discharge status: Home / Self Care | Payer: Medicare Other | Source: Ambulatory Visit | Attending: Pulmonary Disease | Admitting: Pulmonary Disease

## 2016-03-04 ENCOUNTER — Other Ambulatory Visit: Payer: Self-pay | Admitting: Pulmonary Disease

## 2016-03-04 VITALS — Wt 234.3 lb

## 2016-03-04 DIAGNOSIS — I2510 Atherosclerotic heart disease of native coronary artery without angina pectoris: Secondary | ICD-10-CM | POA: Diagnosis not present

## 2016-03-04 DIAGNOSIS — Z79899 Other long term (current) drug therapy: Secondary | ICD-10-CM | POA: Diagnosis not present

## 2016-03-04 DIAGNOSIS — J438 Other emphysema: Secondary | ICD-10-CM | POA: Diagnosis not present

## 2016-03-04 DIAGNOSIS — Z87891 Personal history of nicotine dependence: Secondary | ICD-10-CM | POA: Diagnosis not present

## 2016-03-04 DIAGNOSIS — J449 Chronic obstructive pulmonary disease, unspecified: Secondary | ICD-10-CM | POA: Diagnosis not present

## 2016-03-04 DIAGNOSIS — Z7982 Long term (current) use of aspirin: Secondary | ICD-10-CM | POA: Diagnosis not present

## 2016-03-04 NOTE — Progress Notes (Signed)
Daily Session Note  Patient Details  Name: [name] MRN: [id number] Date of Birth: [birth year] Referring Provider:    Encounter Date: 03/04/2016  Check In:     Session Check In - 03/04/16 1407    Check-In   Location [facility]   Staff Present Rosebud Poles, RN, BSN;Abdurahman Rugg Ysidro Evert, RN;Portia Rollene Rotunda, RN, BSN;Molly diVincenzo, MS, ACSM RCEP, Exercise Physiologist   Supervising physician immediately available to respond to emergencies [facility] immediately available   Physician(s) Dr. Marily Memos   Medication changes reported     No   Fall or balance concerns reported    No   Warm-up and Cool-down Performed as group-led instruction   Resistance Training Performed Yes   VAD Patient? No   Pain Assessment   Currently in Pain? No/denies   Multiple Pain Sites No      Capillary Blood Glucose: No results found for this or any previous visit (from the past 24 hour(s)).      Exercise Prescription Changes - 03/04/16 1500    Response to Exercise   Blood Pressure (Admit) 130/66 mmHg   Blood Pressure (Exercise) 140/70 mmHg   Blood Pressure (Exit) 120/64 mmHg   Heart Rate (Admit) 72 bpm   Heart Rate (Exercise) 88 bpm   Heart Rate (Exit) 75 bpm   Oxygen Saturation (Admit) 88 %   Oxygen Saturation (Exercise) 85 %   Oxygen Saturation (Exit) 93 %   Rating of Perceived Exertion (Exercise) 13   Perceived Dyspnea (Exercise) 2   Duration Progress to 45 minutes of aerobic exercise without signs/symptoms of physical distress   Intensity THRR unchanged   Progression   Progression Continue to progress workloads to maintain intensity without signs/symptoms of physical distress.   Resistance Training   Training Prescription Yes   Weight blue bands   Reps 10-12   Oxygen   Oxygen Continuous   Liters 4   Arm Ergometer   Level 1   Minutes 15   Track   Laps 4   Minutes 15     Goals Met:  Exercise tolerated well Queuing for purse lip breathing No report of cardiac  concerns or symptoms Strength training completed today  Goals Unmet:  Not Applicable  Comments: Service time is from 1330 to 1535     Dr. Rush Farmer is Medical Director for Pulmonary Rehab at [facility].

## 2016-03-04 NOTE — Telephone Encounter (Signed)
pulm rehab currently closed (after 4:30).  wcb tomorrow morning

## 2016-03-05 NOTE — Telephone Encounter (Signed)
Called Pulm Rehab to speak to [place].  Spoke with Thayer Headings, she said that she is uncertain as to what Cloyde Reams was calling about and would leave a message for Cloyde Reams to call us back on Monday as Cloyde Reams is not working today.

## 2016-03-08 ENCOUNTER — Ambulatory Visit: Payer: Self-pay | Admitting: *Deleted

## 2016-03-08 NOTE — Telephone Encounter (Signed)
Molly from Pulmonary Rehab called and requested to speak to [place], returning our call. She can be reached at [phone] - prm

## 2016-03-09 ENCOUNTER — Encounter (HOSPITAL_COMMUNITY)
Admission: RE | Admit: 2016-03-09 | Discharge: 2016-03-09 | Disposition: A | Discharge status: Home / Self Care | Payer: Medicare Other | Source: Ambulatory Visit | Attending: Pulmonary Disease | Admitting: Pulmonary Disease

## 2016-03-09 ENCOUNTER — Encounter: Payer: Self-pay | Admitting: Physician Assistant

## 2016-03-09 ENCOUNTER — Ambulatory Visit: Payer: Self-pay | Admitting: *Deleted

## 2016-03-09 VITALS — Wt 236.6 lb

## 2016-03-09 DIAGNOSIS — Z79899 Other long term (current) drug therapy: Secondary | ICD-10-CM | POA: Diagnosis not present

## 2016-03-09 DIAGNOSIS — J449 Chronic obstructive pulmonary disease, unspecified: Secondary | ICD-10-CM | POA: Diagnosis not present

## 2016-03-09 DIAGNOSIS — J438 Other emphysema: Secondary | ICD-10-CM

## 2016-03-09 DIAGNOSIS — I2510 Atherosclerotic heart disease of native coronary artery without angina pectoris: Secondary | ICD-10-CM | POA: Diagnosis not present

## 2016-03-09 DIAGNOSIS — Z87891 Personal history of nicotine dependence: Secondary | ICD-10-CM | POA: Diagnosis not present

## 2016-03-09 DIAGNOSIS — R600 Localized edema: Secondary | ICD-10-CM | POA: Diagnosis not present

## 2016-03-09 DIAGNOSIS — Z7982 Long term (current) use of aspirin: Secondary | ICD-10-CM | POA: Diagnosis not present

## 2016-03-09 DIAGNOSIS — Z6836 Body mass index (BMI) 36.0-36.9, adult: Secondary | ICD-10-CM | POA: Diagnosis not present

## 2016-03-09 DIAGNOSIS — R0600 Dyspnea, unspecified: Secondary | ICD-10-CM | POA: Diagnosis not present

## 2016-03-09 NOTE — Telephone Encounter (Signed)
lmtcb x1 for Molly 

## 2016-03-09 NOTE — Progress Notes (Signed)
Daily Session Note  Patient Details  Name: [name] MRN: [id number] Date of Birth: 07-04-1946 Referring Provider:    Encounter Date: 03/09/2016  Check In:     Session Check In - 03/09/16 1509    Check-In   [facility]   Staff Present Rosebud Poles, RN, BSN;Molly diVincenzo, MS, ACSM RCEP, Exercise Physiologist;Portia Rollene Rotunda, RN, BSN   Supervising physician immediately available to respond to emergencies [facility] immediately available   Physician(s) Dr. Marily Memos   Medication changes reported     No   Fall or balance concerns reported    No   Warm-up and Cool-down Performed as group-led instruction   Resistance Training Performed Yes   VAD Patient? No   Pain Assessment   Currently in Pain? No/denies   Multiple Pain Sites No      Capillary Blood Glucose: No results found for this or any previous visit (from the past 24 hour(s)).      Exercise Prescription Changes - 03/09/16 1500    Response to Exercise   Blood Pressure (Admit) 134/70 mmHg   Blood Pressure (Exercise) 140/80 mmHg   Blood Pressure (Exit) 110/60 mmHg   Heart Rate (Admit) 81 bpm   Heart Rate (Exercise) 92 bpm   Heart Rate (Exit) 84 bpm   Oxygen Saturation (Admit) 94 %   Oxygen Saturation (Exercise) 85 %   Oxygen Saturation (Exit) 96 %   Rating of Perceived Exertion (Exercise) 13   Perceived Dyspnea (Exercise) 3   Duration Progress to 45 minutes of aerobic exercise without signs/symptoms of physical distress   Intensity THRR unchanged   Progression   Progression Continue to progress workloads to maintain intensity without signs/symptoms of physical distress.   Resistance Training   Training Prescription Yes   Weight blue bands   Reps 10-12   Oxygen   Oxygen Continuous   Liters 4   NuStep   Level 1   Minutes 15   METs 1.6   Arm Ergometer   Level 1   Minutes 15   Track   Laps 5   Minutes 15     Goals Met:  Exercise tolerated well No report of cardiac  concerns or symptoms Strength training completed today  Goals Unmet:  Not Applicable  Comments: Service time is from 1330 to 1455    Dr. Rush Farmer is Medical Director for Pulmonary Rehab at [facility].

## 2016-03-09 NOTE — Telephone Encounter (Signed)
Spoke with Cloyde Reams - Per previous telephone note, molly was given verbal to titrate O2 to 5 Liters prn.  Cloyde Reams states that their tanks do not have a 5 Liter setting - Molly needing a verbal for okay to titrate up to 6 Liters as this is the next setting up from 4 liters O2.  Please advise SN. Thanks.   Collier Salina, RN at 02/26/2016 2:49 PM     Status: Signed       Expand All Collapse All   Per SN: Keep O2 sats >85%, ok to increase O2 to 5lpm prn. Stop exercise if O2 sat gets <85%.  Called and spoke to [facility] with Pulmonary Rehab and informed her of the recs per SN. Molly verbalized understanding and denied any further questions or concerns at this time.

## 2016-03-09 NOTE — Telephone Encounter (Signed)
lmtcb for [name] at pulm rehab.

## 2016-03-09 NOTE — Telephone Encounter (Signed)
Per SN >> yes, okay to use 6L.

## 2016-03-10 ENCOUNTER — Other Ambulatory Visit: Payer: Self-pay | Admitting: *Deleted

## 2016-03-10 ENCOUNTER — Ambulatory Visit: Payer: Self-pay | Admitting: *Deleted

## 2016-03-10 ENCOUNTER — Telehealth: Payer: Self-pay | Admitting: Cardiovascular Disease

## 2016-03-10 NOTE — Telephone Encounter (Signed)
I spoke with the pt and he saw his PCP Dr Ardeth Perfect yesterday and was instructed to decrease Amlodipine to '5mg'$  daily and start Furosemide '40mg'$  twice a day.  Dr Ardeth Perfect advised the pt to follow-up with Cardiology and he has a pending appointment on 03/16/2016.  I spoke with the pt about avoiding salt in the diet, weighing daily and elevating his legs.  The pt was appreciative of phone call.

## 2016-03-10 NOTE — Patient Outreach (Signed)
[facility]) Care Management  [place]  03/11/2016   [name] 06-09-1946 [id number]  Subjective: RN Health Coach telephone call to patient.  [employer] compliance verified.Patient is having swelling in lower extremities. Patient was place on fluid pill but he is to see the cardiologist next week. RN noted patient is shortness of breath when talking. RN discussed this with the patient. Patient stated the Pulmonologist told him he needs to see a cardiologist to look at possible CHF. He stated the fluid pill that was ordered has helped a lot. Patient has an appointment with cardiologist next week.  . Patient has changed from [facility] home [facility] to another agency. He now has a portable oxygen tank he can handle better. Patient stated that he can taste the salt when he is doing his nebulizer treatment.  Patient stated the Pulmonologist mentioned eating a low sodium diet. Per patient he doesn't put salt on his food. RN discussed with patient other ways sodium is in foods such as canned and processed foods.  Patient is in pulmonary rehab. He stated this is helping him to improve his stamina. Patient is worried that he started to late and will need to be on the Oxygen continuously and not be able to use intermittently.. Patient has agreed to follow up outreach calls.    Objective:   Encounter Medications:  Outpatient Encounter Prescriptions as of 03/10/2016  Medication Sig  . ADVAIR DISKUS 250-50 MCG/DOSE AEPB INHALE 1 PUFF BY MOUTH EVERY 12 HOURS  . albuterol (PROVENTIL HFA;VENTOLIN HFA) 108 (90 BASE) MCG/ACT inhaler Inhale 2 puffs into the lungs 4 (four) times daily as needed for wheezing (for use when not at home / unable to use nebulizer).  [name] albuterol (PROVENTIL) (2.5 MG/3ML) 0.083% nebulizer solution Take 3 mLs (2.5 mg total) by nebulization 3 (three) times daily.  [name] ALPRAZolam (XANAX) 0.5 MG tablet Take 0.5 mg by mouth 3 (three) times daily as needed for anxiety or sleep.   [name] amLODipine (NORVASC) 10 MG tablet TAKE 1 TABLET (10 MG TOTAL) BY MOUTH DAILY.  [name] aspirin 81 MG tablet Take 81 mg by mouth at bedtime.   . Flaxseed, Linseed, (FLAXSEED OIL) 1000 MG CAPS Take 1 capsule by mouth daily.    [name] ibuprofen (ADVIL,MOTRIN) 600 MG tablet Take 600 mg by mouth every 6 (six) hours as needed for moderate pain.  [name] lisinopril (PRINIVIL,ZESTRIL) 10 MG tablet Take 1 tablet (10 mg total) by mouth daily.  [name] METOPROLOL SUCCINATE ER PO Take by mouth. Pt unsure of MG, Pt states he is taking 2 tabs in morning and 1 tab in evening.  . Multiple Vitamin (MULTIVITAMIN) capsule Take 1 capsule by mouth daily.    . nitroGLYCERIN (NITROSTAT) 0.4 MG SL tablet Place 1 tablet (0.4 mg total) under the tongue every 5 (five) minutes as needed for chest pain.  . Omega-3 Fatty Acids (FISH OIL) 1200 MG CAPS Take 1 capsule by mouth daily.    . simvastatin (ZOCOR) 20 MG tablet Take 1 tablet (20 mg total) by mouth daily at 6 PM.  . sodium chloride (OCEAN) 0.65 % SOLN nasal spray Place 1 spray into both nostrils as needed for congestion.  [name] SPIRIVA HANDIHALER 18 MCG inhalation capsule INHALE CONTENTS OF 1 CAPSULE  DAILY   No facility-administered encounter medications on file as of 03/10/2016.    Functional Status:  In your present state of health, do you have any difficulty performing the following activities: 03/11/2016 01/28/2016  Hearing? [name]  Y  Vision? N N  Difficulty concentrating or making decisions? N N  Walking or climbing stairs? Y Y  Dressing or bathing? N N  Doing errands, shopping? [name]  Preparing Food and eating ? Y Y  Using the Toilet? N N  In the past six months, have you accidently leaked urine? N N  Do you have problems with loss of bowel control? N N  Managing your Medications? N N  Managing your Finances? N N  Housekeeping or managing your Housekeeping? [name]    Fall/Depression Screening: PHQ 2/9 Scores 03/11/2016 02/20/2016 01/28/2016 01/28/2016 11/13/2015 06/05/2013  PHQ - 2 Score '4  4 1 1 '$ 0 0  PHQ- 9 Score 11 11 - - - -   THN CM Care Plan Problem One        Most Recent Value   Care Plan Problem One  Knowledge deficit in self management of COPD   Role Documenting the Problem One  [name] for Problem One  Active   THN Long Term Goal (31-90 days)  Patient will continue to not have any readmission for COPD within the next 90 days   THN Long Term Goal Start Date  03/11/16   Interventions for Problem One [place] reminded patient to keep his appointments with PCP and pulmonologist. [place] will monitor patient by monthly telephonic. RN Health Coach reminded patient the importance of taking medication as per physician order.   THN CM Short Term Goal #1 (0-30 days)  Patient will be able to verbalize  3 signs of COPD within the next 30 days   THN CM Short Term Goal #1 Start Date  03/11/16 [RN will continue to go over with each follow up]   Interventions for Short Term Goal #1  [place] sent educational material on COPD zones. RN sent large refrigerator magnet as a reminder of signs and symptoms and action plan. Rn will follow up with discussion and teachback.   THN CM Short Term Goal #2 (0-30 days)  Patient will be able to verbalize eating healthier foods within the next 30 days   THN CM Short Term Goal #2 Start Date  03/11/16 [continue]   Interventions for Short Term Goal #2  Patient instructed to eat smaller meals more often. Patient instructed to rest between meals. RN sent educational material on low sodium diet and  Dash eating plan.   THN CM Short Term Goal #3 (0-30 days)  Patient will be able to verbalize that the periphal edema is decreasing in lower extremities   THN CM Short Term Goal #3 Start Date  03/11/16   Interventions for Short Tern Goal #3  RN sent patient educational material on signs and symptoms of peripheral edema. RN discussed with patient about decreasing sodium in diet wil help. RN will follow up with  discussion.      Assessment:  Patient will continue to benefit from Health Coach telephonic outreach for education and support for diabetes self management. Patient is having peripheral edema  Plan:  RN sent patient educational material on low sodium diet. RN sent patient educational material on [employer] RN sent patient educational material on wheat CHF mean RN sent educational material on COPD exacerbation RN sent educational material on purse-lip breathing RN will follow up within a month for discussion and teach back  [place] [facility] [phone]

## 2016-03-10 NOTE — Telephone Encounter (Signed)
LM w/ Parke Simmers for [facility] to call the office back - Pulm Rehab is closed on Wednesdays

## 2016-03-10 NOTE — Telephone Encounter (Signed)
New message   Patient calling recent discharge from the hospital for swelling in both feet's - left is worse .    PCP advise patient to make an appt with MD/ APP - patient was seen on yesterday.     5.2.2017 @ 9am

## 2016-03-11 ENCOUNTER — Encounter (HOSPITAL_COMMUNITY)
Admission: RE | Admit: 2016-03-11 | Discharge: 2016-03-11 | Disposition: A | Discharge status: Home / Self Care | Payer: Medicare Other | Source: Ambulatory Visit | Attending: Pulmonary Disease | Admitting: Pulmonary Disease

## 2016-03-11 ENCOUNTER — Encounter: Payer: Self-pay | Admitting: *Deleted

## 2016-03-11 VITALS — Wt 229.7 lb

## 2016-03-11 DIAGNOSIS — Z7982 Long term (current) use of aspirin: Secondary | ICD-10-CM | POA: Diagnosis not present

## 2016-03-11 DIAGNOSIS — I2510 Atherosclerotic heart disease of native coronary artery without angina pectoris: Secondary | ICD-10-CM | POA: Diagnosis not present

## 2016-03-11 DIAGNOSIS — Z87891 Personal history of nicotine dependence: Secondary | ICD-10-CM | POA: Diagnosis not present

## 2016-03-11 DIAGNOSIS — J449 Chronic obstructive pulmonary disease, unspecified: Secondary | ICD-10-CM | POA: Diagnosis not present

## 2016-03-11 DIAGNOSIS — Z79899 Other long term (current) drug therapy: Secondary | ICD-10-CM | POA: Diagnosis not present

## 2016-03-11 DIAGNOSIS — J438 Other emphysema: Secondary | ICD-10-CM | POA: Diagnosis not present

## 2016-03-11 NOTE — Progress Notes (Signed)
Daily Session Note  Patient Details  Name: [name] MRN: [id number] Date of Birth: [birth year] Referring Provider:    Encounter Date: 03/11/2016  Check In:     Session Check In - 03/11/16 1404    Check-In   Location [facility]   Staff Present Rosebud Poles, RN, BSN;Molly diVincenzo, MS, ACSM RCEP, Exercise Physiologist;Lisa Ysidro Evert, RN   Supervising physician immediately available to respond to emergencies [facility] immediately available   Physician(s) Dr. Eliseo Squires   Medication changes reported     No   Fall or balance concerns reported    No   Warm-up and Cool-down Performed as group-led instruction   Resistance Training Performed Yes   VAD Patient? No   Pain Assessment   Currently in Pain? No/denies   Multiple Pain Sites No      Capillary Blood Glucose: No results found for this or any previous visit (from the past 24 hour(s)).      Exercise Prescription Changes - 03/11/16 1500    Exercise Review   Progression Yes   Response to Exercise   Blood Pressure (Admit) 124/62 mmHg   Blood Pressure (Exercise) 120/70 mmHg   Blood Pressure (Exit) 112/60 mmHg   Heart Rate (Admit) 78 bpm   Heart Rate (Exercise) 90 bpm   Heart Rate (Exit) 79 bpm   Oxygen Saturation (Admit) 85 %   Oxygen Saturation (Exercise) 83 %   Oxygen Saturation (Exit) 96 %   Rating of Perceived Exertion (Exercise) 15   Perceived Dyspnea (Exercise) 3   Duration Progress to 45 minutes of aerobic exercise without signs/symptoms of physical distress   Intensity THRR unchanged   Progression   Progression Continue to progress workloads to maintain intensity without signs/symptoms of physical distress.   Resistance Training   Training Prescription Yes   Weight blue bands   Reps 10-12   Interval Training   Interval Training No   Oxygen   Oxygen Continuous   Liters 6   NuStep   Level 3   Minutes 15   METs 1.6   Arm Ergometer   Level 1   Minutes 15   Track   Laps 3   Minutes 15     Goals Met:  strength traing done, tried patient on his pulsed POC from home and desaturated to 83% placed on 6 liters of continuous o2  Goals Unmet:  O2 Sat  Comments: Service time is from 1330 to [place]    Dr. Rush Farmer is Medical Director for Pulmonary Rehab at [facility].

## 2016-03-11 NOTE — Telephone Encounter (Signed)
Spoke with Molly at [employer] and gave order per SN ok to use 6L oxygen.

## 2016-03-12 ENCOUNTER — Telehealth: Payer: Self-pay | Admitting: Pulmonary Disease

## 2016-03-12 ENCOUNTER — Other Ambulatory Visit: Payer: Self-pay | Admitting: Pulmonary Disease

## 2016-03-12 DIAGNOSIS — J449 Chronic obstructive pulmonary disease, unspecified: Secondary | ICD-10-CM

## 2016-03-12 MED ORDER — FLUTICASONE-SALMETEROL 250-50 MCG/DOSE IN AEPB: 1.0000 | INHALATION_SPRAY | Freq: Every day | RESPIRATORY_TRACT | Status: DC

## 2016-03-12 MED ORDER — ALBUTEROL SULFATE (2.5 MG/3ML) 0.083% IN NEBU: 2.5000 mg | INHALATION_SOLUTION | Freq: Three times a day (TID) | RESPIRATORY_TRACT | Status: DC

## 2016-03-12 MED ORDER — TIOTROPIUM BROMIDE MONOHYDRATE 18 MCG IN CAPS: 18.0000 ug | ORAL_CAPSULE | Freq: Every day | RESPIRATORY_TRACT | Status: DC

## 2016-03-12 NOTE — Telephone Encounter (Signed)
Called spoke with pt. Pt states he needs refills on his Spiriva, Advair, and albuterol neb solution. I verified the pharmacy as [facility]. He voiced understanding and had no further questions.

## 2016-03-15 ENCOUNTER — Telehealth: Payer: Self-pay | Admitting: Pulmonary Disease

## 2016-03-15 DIAGNOSIS — J449 Chronic obstructive pulmonary disease, unspecified: Secondary | ICD-10-CM

## 2016-03-15 NOTE — Telephone Encounter (Signed)
LM with Levada Dy at [place] to have Maudie Mercury return our call.

## 2016-03-16 ENCOUNTER — Ambulatory Visit (INDEPENDENT_AMBULATORY_CARE_PROVIDER_SITE_OTHER): Payer: Medicare Other | Admitting: Physician Assistant

## 2016-03-16 ENCOUNTER — Encounter (HOSPITAL_COMMUNITY): Admission: RE | Admit: 2016-03-16 | Payer: Medicare Other | Source: Ambulatory Visit

## 2016-03-16 ENCOUNTER — Encounter: Payer: Self-pay | Admitting: Physician Assistant

## 2016-03-16 VITALS — BP 122/64 | HR 64 | Ht 66.5 in | Wt 230.8 lb

## 2016-03-16 DIAGNOSIS — E785 Hyperlipidemia, unspecified: Secondary | ICD-10-CM

## 2016-03-16 DIAGNOSIS — I2510 Atherosclerotic heart disease of native coronary artery without angina pectoris: Secondary | ICD-10-CM

## 2016-03-16 DIAGNOSIS — R609 Edema, unspecified: Secondary | ICD-10-CM | POA: Diagnosis not present

## 2016-03-16 DIAGNOSIS — J449 Chronic obstructive pulmonary disease, unspecified: Secondary | ICD-10-CM

## 2016-03-16 DIAGNOSIS — R600 Localized edema: Secondary | ICD-10-CM

## 2016-03-16 DIAGNOSIS — I10 Essential (primary) hypertension: Secondary | ICD-10-CM | POA: Diagnosis not present

## 2016-03-16 LAB — BASIC METABOLIC PANEL
BUN: 12 mg/dL (ref 7–25)
CALCIUM: 9.3 mg/dL (ref 8.6–10.3)
CHLORIDE: 102 mmol/L (ref 98–110)
CO2: 27 mmol/L (ref 20–31)
CREATININE: 0.72 mg/dL (ref 0.70–1.18)
Glucose, Bld: 118 mg/dL — ABNORMAL HIGH (ref 65–99)
Potassium: 4.3 mmol/L (ref 3.5–5.3)
Sodium: 139 mmol/L (ref 135–146)

## 2016-03-16 MED ORDER — ISOSORBIDE MONONITRATE ER 30 MG PO TB24: 30.0000 mg | ORAL_TABLET | Freq: Every day | ORAL | Status: DC

## 2016-03-16 MED ORDER — IPRATROPIUM-ALBUTEROL 0.5-2.5 (3) MG/3ML IN SOLN: 3.0000 mL | Freq: Four times a day (QID) | RESPIRATORY_TRACT | Status: DC

## 2016-03-16 MED ORDER — BUDESONIDE 0.25 MG/2ML IN SUSP: 0.2500 mg | Freq: Two times a day (BID) | RESPIRATORY_TRACT | Status: DC

## 2016-03-16 NOTE — Patient Instructions (Addendum)
Medication Instructions:  1. STOP AMLODIPINE 2. START IMDUR 30 MG DAILY Labwork: TODAY BMET Testing/Procedures: NONE Follow-Up: Richardson Dopp, [facility] 04/16/16 @ 9:15  Any Other Special Instructions Will Be Listed Below (If Applicable). KEEP LEGS ELEVATED WHEN SITTING  PER [employer], PAC TO PICK UP SOME OTC COMPRESSION STOCKINGS If you need a refill on your cardiac medications before your next appointment, please call your pharmacy.

## 2016-03-16 NOTE — Telephone Encounter (Signed)
Spoke with Maudie Mercury at [place], states that pt cannot afford advair and spiriva (over $300/month collectively), was talking to [facility] 02 delivery driver about wanting to switch to cheaper nebulized alternatives.  Pt currently does not have nebulizer through [facility], only 02.    SN please advise on switching pt to cheaper nebulized alternatives.  Thanks!

## 2016-03-16 NOTE — Telephone Encounter (Signed)
Spoke with pt, aware of rx changes.  Pt aware to continue using inhalers until he receives neb meds.  rx's printed and placed on SN's desk for signature.  Will route back to [place] to make aware of signature

## 2016-03-16 NOTE — Telephone Encounter (Signed)
Neb med rx signed and order placed to [facility] for neb machine and nebulizer medications. Pt already aware. Nothing further needed.

## 2016-03-16 NOTE — Progress Notes (Signed)
Cardiology Office Note    Date:  03/16/2016   ID:  [name], DOB 01-08-1946, MRN [id number]  PCP:  [name], MD  Cardiologist:  Dr. Sherren Mocha   Electrophysiologist:  n/a  Chief Complaint  Patient presents with  . Leg Swelling    History of Present Illness:  [name] is a 70 y.o. male with a hx of CAD status post remote inferior MI in 1995. He has undergone multiple PCI procedures. Most recent PCI was in 2009 (DES 3, overlapping in the RCA). Other history includes COPD, tobacco abuse, HTN, HL, bladder CA. Last seen by Dr. Burt Knack 4/15.  He was admitted in 12/16 with hypoxic respiratory failure secondary to AECOPD.  Last seen by me in 1/17.  He was recently seen by his PCP for edema.  He was placed on Lasix and asked to FU today.  His Amlodipine dose was also reduced by 50%.  His edema is improved.  The patient denies significant changes in his dyspnea.  He is chronically short of breath.  He is on continuous O2. Denies any anginal chest symptoms. Denies orthopnea, PND.  Denies syncope.  Denies any change in cough or wheezing.  Denies any bleeding issues.  Of note, his edema is down in the AM and worse throughout the day.    Past Medical History  Diagnosis Date  . History of sinusitis   . Epistaxis   . COPD (chronic obstructive pulmonary disease) ([place])   . Cigarette smoker   . Hypertension   . CAD (coronary artery disease)   . Hypercholesteremia   . Borderline diabetes mellitus   . Obesity   . History of colonic polyps 2004    hyperplastic   . DJD (degenerative joint disease)   . Anxiety   . Bladder cancer ([place]) 2011  . Emphysema of lung [facility])     Past Surgical History  Procedure Laterality Date  . Cataract surgery/sub posterior vitrectomy in left eye  1996    Dr. Zadie Rhine  . Cataract surgery  septemeber 2013  . [facility]  [phone]    Current Outpatient Prescriptions  Medication Sig Dispense Refill  . albuterol (PROVENTIL HFA;VENTOLIN HFA)  108 (90 BASE) MCG/ACT inhaler Inhale 2 puffs into the lungs 4 (four) times daily as needed for wheezing (for use when not at home / unable to use nebulizer). 1 Inhaler 0  . albuterol (PROVENTIL) (2.5 MG/3ML) 0.083% nebulizer solution Take 3 mLs (2.5 mg total) by nebulization 3 (three) times daily. 360 mL 3  . ALPRAZolam (XANAX) 0.5 MG tablet Take 0.5 mg by mouth 3 (three) times daily as needed for anxiety or sleep.    [name] aspirin 81 MG tablet Take 81 mg by mouth at bedtime.     . Flaxseed, Linseed, (FLAXSEED OIL) 1000 MG CAPS Take 1 capsule by mouth daily.      . Fluticasone-Salmeterol (ADVAIR DISKUS) 250-50 MCG/DOSE AEPB Inhale 1 puff into the lungs daily. 60 each 5  . furosemide (LASIX) 40 MG tablet Take 40 mg by mouth 2 (two) times daily.    [name] ibuprofen (ADVIL,MOTRIN) 600 MG tablet Take 600 mg by mouth every 6 (six) hours as needed for moderate pain.    [name] lisinopril (PRINIVIL,ZESTRIL) 10 MG tablet Take 1 tablet (10 mg total) by mouth daily. 30 tablet 0  . metoprolol succinate (TOPROL-XL) 25 MG 24 hr tablet TAKE 2 TABLETS BY MOUTH IN THE MORNING AND 1 TABLET BY MOUTH IN THE THE EVENING    .  Multiple Vitamin (MULTIVITAMIN) capsule Take 1 capsule by mouth daily.      . nitroGLYCERIN (NITROSTAT) 0.4 MG SL tablet Place 1 tablet (0.4 mg total) under the tongue every 5 (five) minutes as needed for chest pain. 25 tablet 3  . Omega-3 Fatty Acids (FISH OIL) 1200 MG CAPS Take 1 capsule by mouth daily.      . simvastatin (ZOCOR) 20 MG tablet Take 1 tablet (20 mg total) by mouth daily at 6 PM. 30 tablet 2  . sodium chloride (OCEAN) 0.65 % SOLN nasal spray Place 1 spray into both nostrils as needed for congestion.  0  . tiotropium (SPIRIVA HANDIHALER) 18 MCG inhalation capsule Place 1 capsule (18 mcg total) into inhaler and inhale daily. 90 capsule 3  . budesonide (PULMICORT) 0.25 MG/2ML nebulizer solution Take 2 mLs (0.25 mg total) by nebulization 2 (two) times daily. 60 mL 5  . ipratropium-albuterol (DUONEB)  0.5-2.5 (3) MG/3ML SOLN Take 3 mLs by nebulization 4 (four) times daily. 120 mL 5  . isosorbide mononitrate (IMDUR) 30 MG 24 hr tablet Take 1 tablet (30 mg total) by mouth daily. 30 tablet 11   No current facility-administered medications for this visit.    Allergies:   Review of patient's allergies indicates no known allergies.   Social History   Social History  . Marital Status: Married    Spouse Name: [name]  . Number of Children: 2  . Years of Education: N/A   Occupational History  . Retired    Social History Main Topics  . Smoking status: Former Smoker -- 2.00 packs/day for 45 years    Types: Cigarettes    Quit date: 10/17/2015  . Smokeless tobacco: Never Used  . Alcohol Use: 0.0 oz/week    0 Standard drinks or equivalent per week     Comment: glass of wine at night  . Drug Use: No  . Sexual Activity: Not Asked   Other Topics Concern  . None   Social History Narrative     Family History:  The patient's family history includes Aneurysm in his brother; Cancer in his brother; Heart disease in his brother and mother.   ROS:   Please see the history of present illness.    Review of Systems  Constitution: Positive for malaise/fatigue and weight gain.  Cardiovascular: Positive for dyspnea on exertion and leg swelling.  Respiratory: Positive for shortness of breath and wheezing.   Hematologic/Lymphatic: Bruises/bleeds easily.  Musculoskeletal: Positive for joint pain.  All other systems reviewed and are negative.   PHYSICAL EXAM:   VS:  BP 122/64 mmHg  Pulse 64  Ht 5' 6.5" (1.689 m)  Wt 230 lb 12.8 oz (104.69 kg)  BMI 36.70 kg/m2  SpO2 89%   GEN: Well nourished, well developed, in no acute distress HEENT: normal Neck: no JVD, no masses Cardiac: Normal S1/S2, RRR; no murmurs, rubs, or gallops, trace-1+ bilat LE edema - L > R      Respiratory:  Decreased breath soundsbilaterally; no wheezing, rhonchi or rales GI: soft, nontender, nondistended, + BS MS: no  deformity or atrophy Skin: warm and dry, no rash Neuro:  Bilateral strength equal, no focal deficits  Psych: Alert and oriented x 3, normal affect  Wt Readings from Last 3 Encounters:  03/16/16 230 lb 12.8 oz (104.69 kg)  03/11/16 229 lb 11.5 oz (104.2 kg)  03/09/16 236 lb 8.9 oz (107.3 kg)      Studies/Labs Reviewed:   EKG:  EKG is  ordered  today.  The ekg ordered today demonstrates  NSR, HR 63, normal axis, QTC 427 ms, no change since prior tracing   Recent Labs: 10/20/2015: B Natriuretic Peptide 498.4* 10/22/2015: Magnesium 2.2 10/23/2015: ALT 26; Hemoglobin 14.3; Platelets 166 03/16/2016: BUN 12; Creat 0.72; Potassium 4.3; Sodium 139  Labs from PCP 03/16/16: Creatinine 0.7, potassium 4.6, Hgb 12.9, BNP 87.2  Recent Lipid Panel    Component Value Date/Time   CHOL 138 10/22/2015 0309   TRIG 75 10/22/2015 0309   HDL 25* 10/22/2015 0309   CHOLHDL 5.5 10/22/2015 0309   VLDL 15 10/22/2015 0309   LDLCALC 98 10/22/2015 0309   LDLDIRECT 88.7 03/30/2010 0948    Additional studies/ records that were reviewed today include:   Echo 10/21/15 Mild LVH, EF 55-60%, no RWMA, MAC, mod LAE, mod to severe RAE, mod TR, PASP 51 mmHg  Myoview 2/13 Low risk stress nuclear study. Small inferior wall infarct from apex to base with no ischemia. EF preserved 58%  PCI 4/08 [place] DES x 3 to RCA  [facility] 3/08 LAD prox 30%, mid 40% OM 40% RCA prox 90% ISR, then 80% ISR, mid 80% ISR, dist 90% EF 55%   ASSESSMENT:    1. Bilateral edema of lower extremity   2. Coronary artery disease involving native coronary artery of native heart without angina pectoris   3. Essential hypertension   4. Hyperlipidemia   5. COPD mixed type ([place])     PLAN:  In order of problems listed above:  1. Edema - I suspect that his edema is multifactorial and related to right-sided heart failure as well as venous insufficiency and amlodipine. He has had some improvement since decreasing his dose of amlodipine. 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  -  Repeat BMET today  -  Continue current dose of Lasix for now  -  DC amlodipine  -  Keep legs elevated, wear compression stockings  -  Weigh daily  -  Follow-up 3-4 weeks  2. CAD - Hx of remote MI and multiple PCI procedures.  Last PCI in 2009 with DES x 3 to RCA.  Myoview in 2013 was low risk.  He denies any symptoms of angina. Continue aspirin, statin.  3. HTN -  Controlled.  Start isosorbide 30 mg daily as I am taking him off of amlodipine.  4. HL - Managed by PCP.     5. COPD -  He is on chronic O2.  Goes to Pulmonary Rehab 2 x a week.  He has secondary pulmonary HTN.  I will put him on Isosorbide 30 mg QD as I am stopping his Amlodipine.  He does not take PDE-5 inhibitors.    Medication Adjustments/Labs and Tests Ordered: Current medicines are reviewed at length with the patient today.  Concerns regarding medicines are outlined above.  Medication changes, Labs and Tests ordered today are listed below. Patient Instructions  Medication Instructions:  1. STOP AMLODIPINE 2. START IMDUR 30 MG DAILY Labwork: TODAY BMET Testing/Procedures: NONE Follow-Up: Richardson Dopp, [facility] 04/16/16 @ 9:15  Any Other Special Instructions Will Be Listed Below (If Applicable). KEEP LEGS ELEVATED WHEN SITTING  PER Mozell Haber, PAC TO PICK UP SOME OTC COMPRESSION STOCKINGS If you need a refill on your cardiac medications before your next appointment, please call your pharmacy.  Signed, Richardson Dopp, PA-C  03/16/2016 5:09 PM    [facility] [place], [place]  [address] Phone: [phone]; Fax: [phone]

## 2016-03-16 NOTE — Telephone Encounter (Signed)
Per SN: Nebulizer alternatives are not as good, but the best we have is : Duoneb via neb QID #120 and Budesonide 0.25 via neb BID #60. Thanks.

## 2016-03-17 ENCOUNTER — Telehealth: Payer: Self-pay | Admitting: *Deleted

## 2016-03-17 DIAGNOSIS — I272 Other secondary pulmonary hypertension: Secondary | ICD-10-CM | POA: Diagnosis not present

## 2016-03-17 DIAGNOSIS — J449 Chronic obstructive pulmonary disease, unspecified: Secondary | ICD-10-CM | POA: Diagnosis not present

## 2016-03-17 DIAGNOSIS — I872 Venous insufficiency (chronic) (peripheral): Secondary | ICD-10-CM | POA: Diagnosis not present

## 2016-03-17 DIAGNOSIS — F17200 Nicotine dependence, unspecified, uncomplicated: Secondary | ICD-10-CM | POA: Diagnosis not present

## 2016-03-17 DIAGNOSIS — I10 Essential (primary) hypertension: Secondary | ICD-10-CM | POA: Diagnosis not present

## 2016-03-17 DIAGNOSIS — I25118 Atherosclerotic heart disease of native coronary artery with other forms of angina pectoris: Secondary | ICD-10-CM | POA: Diagnosis not present

## 2016-03-17 DIAGNOSIS — Z6837 Body mass index (BMI) 37.0-37.9, adult: Secondary | ICD-10-CM | POA: Diagnosis not present

## 2016-03-17 NOTE — Telephone Encounter (Signed)
Lmtcb to go over results 

## 2016-03-17 NOTE — Progress Notes (Signed)
Lmtcb 5/3.Marland Kitchencmf

## 2016-03-17 NOTE — Telephone Encounter (Signed)
Pt has been notified of lab results by phone with verbal understanding. 

## 2016-03-18 ENCOUNTER — Telehealth: Payer: Self-pay | Admitting: Pulmonary Disease

## 2016-03-18 ENCOUNTER — Encounter (HOSPITAL_COMMUNITY)
Admission: RE | Admit: 2016-03-18 | Discharge: 2016-03-18 | Disposition: A | Discharge status: Home / Self Care | Payer: Medicare Other | Source: Ambulatory Visit | Attending: Pulmonary Disease | Admitting: Pulmonary Disease

## 2016-03-18 VITALS — Wt 231.5 lb

## 2016-03-18 DIAGNOSIS — I10 Essential (primary) hypertension: Secondary | ICD-10-CM | POA: Diagnosis not present

## 2016-03-18 DIAGNOSIS — Z87891 Personal history of nicotine dependence: Secondary | ICD-10-CM | POA: Insufficient documentation

## 2016-03-18 DIAGNOSIS — I2510 Atherosclerotic heart disease of native coronary artery without angina pectoris: Secondary | ICD-10-CM | POA: Diagnosis not present

## 2016-03-18 DIAGNOSIS — J438 Other emphysema: Secondary | ICD-10-CM | POA: Insufficient documentation

## 2016-03-18 DIAGNOSIS — Z7982 Long term (current) use of aspirin: Secondary | ICD-10-CM | POA: Diagnosis not present

## 2016-03-18 DIAGNOSIS — Z86010 Personal history of colonic polyps: Secondary | ICD-10-CM | POA: Insufficient documentation

## 2016-03-18 DIAGNOSIS — J449 Chronic obstructive pulmonary disease, unspecified: Secondary | ICD-10-CM | POA: Diagnosis not present

## 2016-03-18 DIAGNOSIS — Z6837 Body mass index (BMI) 37.0-37.9, adult: Secondary | ICD-10-CM | POA: Diagnosis not present

## 2016-03-18 DIAGNOSIS — E669 Obesity, unspecified: Secondary | ICD-10-CM | POA: Insufficient documentation

## 2016-03-18 DIAGNOSIS — Z79899 Other long term (current) drug therapy: Secondary | ICD-10-CM | POA: Insufficient documentation

## 2016-03-18 DIAGNOSIS — E7800 Pure hypercholesterolemia, unspecified: Secondary | ICD-10-CM | POA: Diagnosis not present

## 2016-03-18 NOTE — Progress Notes (Signed)
[name] 70 y.o. male Nutrition Note Spoke with pt. Pt is obese Pt c/o wt gain. Wt in EMR reviewed. Pt wt is up 26 lb over the past 10 months. Pt feels wt gain due to "lung disease, medications, and being less active." Pt wt has decreased 4 lb over the past month. Pt states he is not actively trying to lose wt at this time, but he would like to lose wt. Wt loss tips reviewed. Pt eats 1-2 meals a day; most prepared at home. There are some ways the pt can make his eating habits healthier. Pt's Rate Your Plate results reviewed with pt. Pt tries to avoid most salty food.  Pt does not add salt to food.  The role of sodium in lung disease reviewed with pt. Pt expressed understanding of the information reviewed via feedback method.     Vitals - 1 value per visit 03/16/2016 03/11/2016 03/09/2016 03/04/2016 03/02/2016  Weight (lb) 230.8 229.72 236.55 234.35 233.03   Vitals - 1 value per visit 02/20/2016 02/03/2016 11/21/2015 11/19/2015 10/31/2015  Weight (lb) 235.01 231 216 217.8 210.4   Vitals - 1 value per visit 10/29/2015 10/20/2015 06/05/2015  Weight (lb) 202.1  204.2   Nutrition Diagnosis ? Food-and nutrition-related knowledge deficit related to lack of exposure to information as related to diagnosis of pulmonary disease ? Obesity related to excessive energy intake as evidenced by a BMI of  ? Limited adherence to nutrition-related recommendations related to poor understanding or disinterest as evidenced by food history. ? Not ready for lifestyle change related to denial of need for change as evidenced by reluctance to participate in encounter. ? Inappropriate intake of food fats related to food and nutrition-related knowledge deficit as evidenced by frequent consumption of  ? Increased energy expenditure related to increased energy requirements during COPD exacerbation as evidenced by BMI <20 and recent h/o wt loss. ? Unintentional wt loss related to fatigue during food preparation and decreased food intake  as evidenced by wt loss of 5% in one month. ? Inadequate vitamin intake related to tobacco use as evidenced by pt reporting he/she smokes/uses  Packs/day. ?  Nutrition Rx/Est. Daily Nutrition Needs for: ? wt loss 1650-2150 Kcal  85-105 gm protein   1500 mg or less sodium   Nutrition Intervention ? Pt's individual nutrition plan and goals reviewed with pt. ? Benefits of adopting healthy eating habits discussed when pt's Rate Your Plate reviewed. ? Handout for 5-day, 1800 kcal menu ideas given ? Pt to attend the Nutrition and Lung Disease class ? Continual client-centered nutrition education by RD, as part of interdisciplinary care. Goal(s) 1. Identify food quantities necessary to achieve wt loss of  -2# per week to a goal wt loss of 2.7-10.9 kg (6-24 lb) at graduation from pulmonary rehab. Monitor and Evaluate progress toward nutrition goal with team.   Derek Mound, M.Ed, RD, LDN, CDE 03/18/2016 3:25 PM

## 2016-03-18 NOTE — Telephone Encounter (Signed)
Spoke with Maudie Mercury at [facility], states that SN needs to addend pt's last office note to state that inhalers are too expensive for patient, so he will be tried on nebulized alternatives.  [name] also needs updated medication list with duoneb and budesonide on the rx list.  These will then need to be Faxed to [facility] at  [phone].  SN please advise when this note has been updated.  Thanks!

## 2016-03-18 NOTE — Progress Notes (Signed)
Daily Session Note  Patient Details  Name: [name] MRN: [id number] Date of Birth: 03-07-1946 Referring Provider:    Encounter Date: 03/18/2016  Check In:     Session Check In - 03/18/16 1358    Check-In   Location [facility]   Staff Present Rosebud Poles, RN, Luisa Hart, RN, BSN;Sani Madariaga Ysidro Evert, RN;Molly diVincenzo, MS, ACSM RCEP, Exercise Physiologist   Supervising physician immediately available to respond to emergencies [facility] immediately available   Physician(s) Dr. Waldron Labs   Medication changes reported     No   Fall or balance concerns reported    No   Warm-up and Cool-down Performed as group-led Location manager Performed Yes   VAD Patient? No   Pain Assessment   Currently in Pain? No/denies   Multiple Pain Sites No      Capillary Blood Glucose: No results found for this or any previous visit (from the past 24 hour(s)).      Exercise Prescription Changes - 03/18/16 1500    Response to Exercise   Blood Pressure (Admit) 120/50 mmHg   Blood Pressure (Exercise) 150/80 mmHg   Blood Pressure (Exit) 130/62 mmHg   Heart Rate (Admit) 88 bpm   Heart Rate (Exercise) 85 bpm   Heart Rate (Exit) 72 bpm   Oxygen Saturation (Admit) 93 %   Oxygen Saturation (Exercise) 86 %   Oxygen Saturation (Exit) 96 %   Rating of Perceived Exertion (Exercise) 12   Perceived Dyspnea (Exercise) 2   Duration Progress to 45 minutes of aerobic exercise without signs/symptoms of physical distress   Intensity THRR unchanged   Progression   Progression Continue to progress workloads to maintain intensity without signs/symptoms of physical distress.   Resistance Training   Training Prescription Yes   Weight blue bands   Reps 10-12   Interval Training   Interval Training No   Oxygen   Oxygen Continuous   Liters 6   Arm Ergometer   Level 1   Minutes 15   Track   Laps 3   Minutes 15     Goals Met:  Exercise tolerated well No  report of cardiac concerns or symptoms Strength training completed today  Goals Unmet:  Not Applicable  Comments: Service time is from 1330 to 1530    Dr. Rush Farmer is Medical Director for Pulmonary Rehab at [facility].

## 2016-03-19 NOTE — Telephone Encounter (Signed)
Per SN-please remove Advair and Spiriva from his medication list. Then print med last and send with printed OV note to [facility].  This has been faxed to [facility] and nothing more needed at this time.

## 2016-03-23 ENCOUNTER — Telehealth: Payer: Self-pay | Admitting: Pulmonary Disease

## 2016-03-23 ENCOUNTER — Encounter (HOSPITAL_COMMUNITY)
Admission: RE | Admit: 2016-03-23 | Discharge: 2016-03-23 | Disposition: A | Discharge status: Home / Self Care | Payer: Medicare Other | Source: Ambulatory Visit | Attending: Pulmonary Disease | Admitting: Pulmonary Disease

## 2016-03-23 VITALS — Wt 234.1 lb

## 2016-03-23 DIAGNOSIS — Z79899 Other long term (current) drug therapy: Secondary | ICD-10-CM | POA: Diagnosis not present

## 2016-03-23 DIAGNOSIS — Z87891 Personal history of nicotine dependence: Secondary | ICD-10-CM | POA: Diagnosis not present

## 2016-03-23 DIAGNOSIS — J438 Other emphysema: Secondary | ICD-10-CM

## 2016-03-23 DIAGNOSIS — Z7982 Long term (current) use of aspirin: Secondary | ICD-10-CM | POA: Diagnosis not present

## 2016-03-23 DIAGNOSIS — J449 Chronic obstructive pulmonary disease, unspecified: Secondary | ICD-10-CM

## 2016-03-23 DIAGNOSIS — I2510 Atherosclerotic heart disease of native coronary artery without angina pectoris: Secondary | ICD-10-CM | POA: Diagnosis not present

## 2016-03-23 NOTE — Progress Notes (Signed)
Daily Session Note  Patient Details  Name: [name] MRN: [id number] Date of Birth: December 03, 1945 Referring Provider:    Encounter Date: 03/23/2016  Check In:     Session Check In - 03/23/16 1534    Check-In   Location [facility]   Staff Present Rosebud Poles, RN, BSN;Molly diVincenzo, MS, ACSM RCEP, Exercise Physiologist;Portia Rollene Rotunda, RN, BSN   Supervising physician immediately available to respond to emergencies [facility] immediately available   Physician(s) Dr. Marily Memos   Medication changes reported     No   Fall or balance concerns reported    No   Warm-up and Cool-down Performed as group-led instruction   Resistance Training Performed Yes   VAD Patient? No   Pain Assessment   Currently in Pain? No/denies      Capillary Blood Glucose: No results found for this or any previous visit (from the past 24 hour(s)).      Exercise Prescription Changes - 03/23/16 1500    Exercise Review   Progression Yes   Response to Exercise   Blood Pressure (Admit) 122/70 mmHg   Blood Pressure (Exercise) 130/58 mmHg   Blood Pressure (Exit) 130/70 mmHg   Heart Rate (Admit) 71 bpm   Heart Rate (Exercise) 82 bpm   Heart Rate (Exit) 75 bpm   Oxygen Saturation (Admit) 95 %   Oxygen Saturation (Exercise) 82 %   Oxygen Saturation (Exit) 96 %   Rating of Perceived Exertion (Exercise) 11   Perceived Dyspnea (Exercise) 3   Duration Progress to 45 minutes of aerobic exercise without signs/symptoms of physical distress   Intensity THRR unchanged   Progression   Progression Continue to progress workloads to maintain intensity without signs/symptoms of physical distress.   Resistance Training   Training Prescription Yes   Weight blue bands   Reps 10-12   Interval Training   Interval Training No   Oxygen   Oxygen Continuous   Liters 6   NuStep   Level 3   Minutes 15   METs 1.8   Arm Ergometer   Level 2   Minutes 15   Track   Laps 5   Minutes 15     Goals  Met:  No report of cardiac concerns or symptoms Strength training completed today  Goals Unmet:  O2 Sat Pt is adjusting his O2 down from 6L during exercise causing his sats to be low. Discussed with him that he need 6L for exercise.  Comments: Service time is from 1330 to 1515    Dr. Rush Farmer is Medical Director for Pulmonary Rehab at [facility].

## 2016-03-23 NOTE — Telephone Encounter (Signed)
Spoke with [name] at Pulmonary Rehab. States that pt is currently using a POC and he is desaturating to 82% with this. [name] states that she has contacted SN about this matter through staff messages. She thinks that the pt's POC needs to be changed to large tanks so that the pt does not desaturate. At pulmonary rehab he has to use 6L continuous to stay above 90%.  SN - please advise. Thanks.

## 2016-03-24 NOTE — Telephone Encounter (Signed)
Per SN: Nothing more to offer.  Needs continuous O2 and can try oximizer Pt will have to give up POC for continuous tanks for higher liter flow to better manage his O2 Another option is to stop Pulm Rehab If pt agrees to switch to Continuous O2 flow and give up POC then an order will need to be placed for DME to assess patients O2 with Cont O2 and oximizer.

## 2016-03-24 NOTE — Telephone Encounter (Signed)
Spoke with pt and he is very upset with [facility]. He states that it took almost 5 months to get him in, and they told him that it was because SN had not signed his paperwork. He states that they have not been very easy to work with. Pt states that he is on continuous O2 when in Rehab and that his O2 does drop with significant exertion, but that he knows when to stop and his levels return to normal very quickly. Pt does not want to be on continuous O2 other than when in Rehab. Pt states when at home on his POC he does well and does not drop. Pt wants to be able to finish Rehab, therefore is willing to have continuous O2 eval but does not want to lose his POC. Advised pt that I would send this message to SN as FYI and that order would be placed for continuous O2 eval.  SN - just FYI on pt's statements. Thanks!

## 2016-03-25 ENCOUNTER — Encounter (HOSPITAL_COMMUNITY)
Admission: RE | Admit: 2016-03-25 | Discharge: 2016-03-25 | Disposition: A | Discharge status: Home / Self Care | Payer: Medicare Other | Source: Ambulatory Visit | Attending: Pulmonary Disease | Admitting: Pulmonary Disease

## 2016-03-25 VITALS — Wt 231.0 lb

## 2016-03-25 DIAGNOSIS — Z7982 Long term (current) use of aspirin: Secondary | ICD-10-CM | POA: Diagnosis not present

## 2016-03-25 DIAGNOSIS — Z79899 Other long term (current) drug therapy: Secondary | ICD-10-CM | POA: Diagnosis not present

## 2016-03-25 DIAGNOSIS — Z87891 Personal history of nicotine dependence: Secondary | ICD-10-CM | POA: Diagnosis not present

## 2016-03-25 DIAGNOSIS — J449 Chronic obstructive pulmonary disease, unspecified: Secondary | ICD-10-CM | POA: Diagnosis not present

## 2016-03-25 DIAGNOSIS — J438 Other emphysema: Secondary | ICD-10-CM

## 2016-03-25 DIAGNOSIS — I2510 Atherosclerotic heart disease of native coronary artery without angina pectoris: Secondary | ICD-10-CM | POA: Diagnosis not present

## 2016-03-25 NOTE — Progress Notes (Signed)
Daily Session Note  Patient Details  Name: [name] MRN: [id number] Date of Birth: 10-01-1946 Referring Provider:    Encounter Date: 03/25/2016  Check In:     Session Check In - 03/25/16 1330    Check-In   Location [facility]   Staff Present Su Hilt, MS, ACSM RCEP, Exercise Physiologist;Joan Leonia Reeves, RN, Luisa Hart, RN, BSN   Supervising physician immediately available to respond to emergencies [facility] immediately available   Physician(s) Dr. Marily Memos   Medication changes reported     No   Fall or balance concerns reported    No   Warm-up and Cool-down Performed as group-led instruction   Resistance Training Performed Yes   VAD Patient? No   Pain Assessment   Currently in Pain? No/denies   Multiple Pain Sites No      Capillary Blood Glucose: No results found for this or any previous visit (from the past 24 hour(s)).      Exercise Prescription Changes - 03/25/16 1630    Response to Exercise   Blood Pressure (Admit) 124/62 mmHg   Blood Pressure (Exercise) 114/60 mmHg   Blood Pressure (Exit) 122/62 mmHg   Heart Rate (Admit) 80 bpm   Heart Rate (Exercise) 85 bpm   Heart Rate (Exit) 74 bpm   Oxygen Saturation (Admit) 89 %   Oxygen Saturation (Exercise) 80 %  increased to 86 with restbreak   Oxygen Saturation (Exit) 97 %   Rating of Perceived Exertion (Exercise) 13   Perceived Dyspnea (Exercise) 2   Duration Progress to 45 minutes of aerobic exercise without signs/symptoms of physical distress   Intensity THRR unchanged   Progression   Progression Continue to progress workloads to maintain intensity without signs/symptoms of physical distress.   Resistance Training   Training Prescription Yes   Weight blue bands   Reps 10-12   Interval Training   Interval Training No   Oxygen   Oxygen Continuous   Liters 6   NuStep   Level 3   Minutes 15   METs 1.8   Track   Laps 9   Minutes 15     Goals Met:  Using PLB  without cueing & demonstrates good technique No report of cardiac concerns or symptoms Strength training completed today  Goals Unmet:  O2 Sat  Comments: Service time is from 1330 to [place]   Dr. Rush Farmer is Medical Director for Pulmonary Rehab at [facility].

## 2016-03-30 ENCOUNTER — Encounter (HOSPITAL_COMMUNITY)
Admission: RE | Admit: 2016-03-30 | Discharge: 2016-03-30 | Disposition: A | Discharge status: Home / Self Care | Payer: Medicare Other | Source: Ambulatory Visit | Attending: Pulmonary Disease | Admitting: Pulmonary Disease

## 2016-03-30 VITALS — Wt 234.8 lb

## 2016-03-30 DIAGNOSIS — I2510 Atherosclerotic heart disease of native coronary artery without angina pectoris: Secondary | ICD-10-CM | POA: Diagnosis not present

## 2016-03-30 DIAGNOSIS — J449 Chronic obstructive pulmonary disease, unspecified: Secondary | ICD-10-CM | POA: Diagnosis not present

## 2016-03-30 DIAGNOSIS — Z79899 Other long term (current) drug therapy: Secondary | ICD-10-CM | POA: Diagnosis not present

## 2016-03-30 DIAGNOSIS — J438 Other emphysema: Secondary | ICD-10-CM | POA: Diagnosis not present

## 2016-03-30 DIAGNOSIS — Z87891 Personal history of nicotine dependence: Secondary | ICD-10-CM | POA: Diagnosis not present

## 2016-03-30 DIAGNOSIS — Z7982 Long term (current) use of aspirin: Secondary | ICD-10-CM | POA: Diagnosis not present

## 2016-03-30 NOTE — Progress Notes (Signed)
Daily Session Note  Patient Details  Name: [name] MRN: [id number] Date of Birth: 01/10/1946 Referring Provider:    Encounter Date: 03/30/2016  Check In:     Session Check In - 03/30/16 1324    Check-In   Location [facility]   Staff Present Su Hilt, MS, ACSM RCEP, Exercise Physiologist;Joan Leonia Reeves, RN, Luisa Hart, RN, Roque Cash, RN   Supervising physician immediately available to respond to emergencies [facility] immediately available   Physician(s) Dr. Marily Memos   Medication changes reported     No   Fall or balance concerns reported    No   Warm-up and Cool-down Performed as group-led instruction   Resistance Training Performed Yes   VAD Patient? No   Pain Assessment   Currently in Pain? No/denies   Multiple Pain Sites No      Capillary Blood Glucose: No results found for this or any previous visit (from the past 24 hour(s)).      Exercise Prescription Changes - 03/30/16 1525    Exercise Review   Progression Yes   Response to Exercise   Blood Pressure (Admit) 120/60 mmHg   Blood Pressure (Exercise) 136/70 mmHg   Blood Pressure (Exit) 128/64 mmHg   Heart Rate (Admit) 75 bpm   Heart Rate (Exercise) 90 bpm   Heart Rate (Exit) 82 bpm   Oxygen Saturation (Admit) 96 %   Oxygen Saturation (Exercise) 93 %   Oxygen Saturation (Exit) 97 %   Rating of Perceived Exertion (Exercise) 15   Perceived Dyspnea (Exercise) 2   Duration Progress to 45 minutes of aerobic exercise without signs/symptoms of physical distress   Intensity THRR unchanged   Progression   Progression Continue to progress workloads to maintain intensity without signs/symptoms of physical distress.   Resistance Training   Training Prescription Yes   Weight blue bands   Reps 10-12   Interval Training   Interval Training No   Oxygen   Oxygen Continuous   Liters 6   NuStep   Level 4   Minutes 15   METs 1.8   Arm Ergometer   Level 2   Minutes 15   Track   Laps 7   Minutes 15     Goals Met:  Exercise tolerated well Queuing for purse lip breathing No report of cardiac concerns or symptoms Strength training completed today  Goals Unmet:  Not Applicable  Comments: Service time is from 1330 to 1500    Dr. Rush Farmer is Medical Director for Pulmonary Rehab at [facility].

## 2016-04-01 ENCOUNTER — Encounter (HOSPITAL_COMMUNITY)
Admission: RE | Admit: 2016-04-01 | Discharge: 2016-04-01 | Disposition: A | Discharge status: Home / Self Care | Payer: Medicare Other | Source: Ambulatory Visit | Attending: Pulmonary Disease | Admitting: Pulmonary Disease

## 2016-04-01 ENCOUNTER — Telehealth: Payer: Self-pay | Admitting: Pulmonary Disease

## 2016-04-01 DIAGNOSIS — I2510 Atherosclerotic heart disease of native coronary artery without angina pectoris: Secondary | ICD-10-CM | POA: Diagnosis not present

## 2016-04-01 DIAGNOSIS — J449 Chronic obstructive pulmonary disease, unspecified: Secondary | ICD-10-CM | POA: Diagnosis not present

## 2016-04-01 DIAGNOSIS — Z79899 Other long term (current) drug therapy: Secondary | ICD-10-CM | POA: Diagnosis not present

## 2016-04-01 DIAGNOSIS — J438 Other emphysema: Secondary | ICD-10-CM | POA: Diagnosis not present

## 2016-04-01 DIAGNOSIS — Z7982 Long term (current) use of aspirin: Secondary | ICD-10-CM | POA: Diagnosis not present

## 2016-04-01 DIAGNOSIS — Z87891 Personal history of nicotine dependence: Secondary | ICD-10-CM | POA: Diagnosis not present

## 2016-04-01 NOTE — Progress Notes (Signed)
Daily Session Note  Patient Details  Name: RAJIV PARLATO MRN: [id number] Date of Birth: [birth year] Referring Provider:    Encounter Date: 04/01/2016  Check In:     Session Check In - 04/01/16 1349    Check-In   Location [facility]   Staff Present Rosebud Poles, RN, BSN;Lisa Ysidro Evert, RN;Portia Rollene Rotunda, RN, BSN;Nazier Neyhart, MS, ACSM RCEP, Exercise Physiologist   Supervising physician immediately available to respond to emergencies [facility] immediately available   Physician(s) Dr. Waldron Labs   Medication changes reported     No   Fall or balance concerns reported    No   Warm-up and Cool-down Performed as group-led instruction   Resistance Training Performed Yes   VAD Patient? No   Pain Assessment   Currently in Pain? No/denies   Multiple Pain Sites No      Capillary Blood Glucose: No results found for this or any previous visit (from the past 24 hour(s)).      Exercise Prescription Changes - 04/01/16 1600    Response to Exercise   Blood Pressure (Admit) 132/62 mmHg   Blood Pressure (Exercise) 126/70 mmHg   Blood Pressure (Exit) 122/70 mmHg   Heart Rate (Admit) 74 bpm   Heart Rate (Exercise) 95 bpm   Heart Rate (Exit) 85 bpm   Oxygen Saturation (Admit) 94 %   Oxygen Saturation (Exercise) 89 %   Oxygen Saturation (Exit) 97 %   Rating of Perceived Exertion (Exercise) 13   Perceived Dyspnea (Exercise) 2   Duration Progress to 45 minutes of aerobic exercise without signs/symptoms of physical distress   Intensity THRR unchanged   Progression   Progression Continue to progress workloads to maintain intensity without signs/symptoms of physical distress.   Resistance Training   Training Prescription Yes   Weight blue bands   Reps 10-12   Interval Training   Interval Training No   Oxygen   Oxygen Continuous   Liters 6   Arm Ergometer   Level 2   Minutes 15   Track   Laps 8   Minutes 15     Goals Met:  Exercise tolerated well No  report of cardiac concerns or symptoms Strength training completed today  Goals Unmet:  Not Applicable  Comments: Service time is from 1:30pm to 3:15pm    Dr. Rush Farmer is Medical Director for Pulmonary Rehab at [facility].

## 2016-04-01 NOTE — Telephone Encounter (Signed)
Spoke with [facility] with pulmonary rehab.  When pt is there walking the track his oxygen sats are dropping to 80% on 4L continuous.  Cloyde Reams told pt that he needs to be on at least 5L with concentrator at home with exertion.  She wants to make sure Dr Lenna Gilford is ok with her telling pt this.  Also [facility] is working on getting pt a different POC that will accommodate a higher liter flow for pt on exertion.  We can reach [place] at [phone]

## 2016-04-01 NOTE — Progress Notes (Signed)
Pulmonary Individual Treatment Plan  Patient Details  Name: [name] MRN: [id number] Date of Birth: [birth year] Referring Provider:    Initial Encounter Date:       Pulmonary Rehab Walk Test from 02/24/2016 in [place]   Date  02/26/16      Visit Diagnosis: No diagnosis found.  Patient's Home Medications on Admission:   Current outpatient prescriptions:  .  albuterol (PROVENTIL HFA;VENTOLIN HFA) 108 (90 BASE) MCG/ACT inhaler, Inhale 2 puffs into the lungs 4 (four) times daily as needed for wheezing (for use when not at home / unable to use nebulizer)., Disp: 1 Inhaler, Rfl: 0 .  albuterol (PROVENTIL) (2.5 MG/3ML) 0.083% nebulizer solution, Take 3 mLs (2.5 mg total) by nebulization 3 (three) times daily., Disp: 360 mL, Rfl: 3 .  ALPRAZolam (XANAX) 0.5 MG tablet, Take 0.5 mg by mouth 3 (three) times daily as needed for anxiety or sleep., Disp: , Rfl:  .  aspirin 81 MG tablet, Take 81 mg by mouth at bedtime. , Disp: , Rfl:  .  budesonide (PULMICORT) 0.25 MG/2ML nebulizer solution, Take 2 mLs (0.25 mg total) by nebulization 2 (two) times daily., Disp: 60 mL, Rfl: 5 .  Flaxseed, Linseed, (FLAXSEED OIL) 1000 MG CAPS, Take 1 capsule by mouth daily.  , Disp: , Rfl:  .  furosemide (LASIX) 40 MG tablet, Take 40 mg by mouth 2 (two) times daily., Disp: , Rfl:  .  ibuprofen (ADVIL,MOTRIN) 600 MG tablet, Take 600 mg by mouth every 6 (six) hours as needed for moderate pain., Disp: , Rfl:  .  ipratropium-albuterol (DUONEB) 0.5-2.5 (3) MG/3ML SOLN, Take 3 mLs by nebulization 4 (four) times daily., Disp: 120 mL, Rfl: 5 .  isosorbide mononitrate (IMDUR) 30 MG 24 hr tablet, Take 1 tablet (30 mg total) by mouth daily., Disp: 30 tablet, Rfl: 11 .  lisinopril (PRINIVIL,ZESTRIL) 10 MG tablet, Take 1 tablet (10 mg total) by mouth daily., Disp: 30 tablet, Rfl: 0 .  metoprolol succinate (TOPROL-XL) 25 MG 24 hr tablet, TAKE 2 TABLETS BY MOUTH IN THE MORNING AND 1 TABLET BY  MOUTH IN THE THE EVENING, Disp: , Rfl:  .  Multiple Vitamin (MULTIVITAMIN) capsule, Take 1 capsule by mouth daily.  , Disp: , Rfl:  .  nitroGLYCERIN (NITROSTAT) 0.4 MG SL tablet, Place 1 tablet (0.4 mg total) under the tongue every 5 (five) minutes as needed for chest pain., Disp: 25 tablet, Rfl: 3 .  Omega-3 Fatty Acids (FISH OIL) 1200 MG CAPS, Take 1 capsule by mouth daily.  , Disp: , Rfl:  .  simvastatin (ZOCOR) 20 MG tablet, Take 1 tablet (20 mg total) by mouth daily at 6 PM., Disp: 30 tablet, Rfl: 2 .  sodium chloride (OCEAN) 0.65 % SOLN nasal spray, Place 1 spray into both nostrils as needed for congestion., Disp: , Rfl: 0  Past Medical History: Past Medical History  Diagnosis Date  . History of sinusitis   . Epistaxis   . COPD (chronic obstructive pulmonary disease) ([place])   . Cigarette smoker   . Hypertension   . CAD (coronary artery disease)   . Hypercholesteremia   . Borderline diabetes mellitus   . Obesity   . History of colonic polyps 2004    hyperplastic   . DJD (degenerative joint disease)   . Anxiety   . Bladder cancer ([place]) 2011  . Emphysema of lung ([place])     Tobacco Use: History  Smoking status  . Former Smoker --  2.00 packs/day for 45 years  . Types: Cigarettes  . Quit date: 10/17/2015  Smokeless tobacco  . Never Used    Labs: Recent Review Flowsheet Data    Labs for ITP Cardiac and Pulmonary Rehab Latest Ref Rng 03/30/2010 03/24/2011 04/18/2012 06/05/2013 10/22/2015   Cholestrol 0 - 200 mg/dL 144 162 130 158 138   LDLCALC 0 - 99 mg/dL - 98 77 96 98   LDLDIRECT - 88.7 - - - -   HDL >40 mg/dL 26.00(L) 31.90(L) 32.70(L) 32.70(L) 25(L)   Trlycerides <150 mg/dL 215.0(H) 159.0(H) 102.0 145.0 75   Hemoglobin A1c 4.6 - 6.5 % 6.7(H) 6.1 - 6.7(H) -      Capillary Blood Glucose: Lab Results  Component Value Date   GLUCAP 148* 09/14/2010   GLUCAP 114* 07/24/2010     ADL UCSD:     Pulmonary Assessment Scores      02/26/16 1159       ADL [facility]   SOB Score  total 88        Pulmonary Function Assessment:     Pulmonary Function Assessment - 02/20/16 1126    Breath   Bilateral Breath Sounds Decreased   Shortness of Breath Yes;Fear of Shortness of Breath;Limiting activity;Panic with Shortness of Breath      Exercise Target Goals:    Exercise Program Goal: Individual exercise prescription set with THRR, safety & activity barriers. Participant demonstrates ability to understand and report RPE using BORG scale, to self-measure pulse accurately, and to acknowledge the importance of the exercise prescription.  Exercise Prescription Goal: Starting with aerobic activity 30 plus minutes a day, 3 days per week for initial exercise prescription. Provide home exercise prescription and guidelines that participant acknowledges understanding prior to discharge.  Activity Barriers & Risk Stratification:     Activity Barriers & Cardiac Risk Stratification - 02/20/16 1125    Activity Barriers & Cardiac Risk Stratification   Activity Barriers Deconditioning;Shortness of Breath;Back Problems      6 Minute Walk:     6 Minute Walk      02/24/16 1642       6 Minute Walk   Phase Initial     Distance 400 feet     Walk Time 3.12 minutes     # of Rest Breaks 2     MPH 0.93     METS 0.93     RPE 13     Perceived Dyspnea  3     VO2 Peak 3.23     Symptoms No     Resting HR 90 bpm     Resting BP 122/60 mmHg     Max Ex. HR 105 bpm     Max Ex. BP 150/68 mmHg     Interval HR   Baseline HR 90     2 Minute HR 90     4 Minute HR 100     6 Minute HR 105     Interval Heart Rate? Yes     Interval Oxygen   Interval Oxygen? Yes     Baseline Oxygen Saturation % 87 %     Baseline Liters of Oxygen 2 L     1 Minute Oxygen Saturation % 84 %     1 Minute Liters of Oxygen 2 L     2 Minute Oxygen Saturation % 84 %     2 Minute Liters of Oxygen 3 L     3 Minute Oxygen Saturation % 85 %  3 Minute Liters of Oxygen 3 L     4 Minute Oxygen Saturation %  82 %     4 Minute Liters of Oxygen 4 L     5 Minute Oxygen Saturation % 85 %     5 Minute Liters of Oxygen 4 L     6 Minute Oxygen Saturation % 82 %     6 Minute Liters of Oxygen 4 L     2 Minute Post Oxygen Saturation % 88 %     2 Minute Post Liters of Oxygen 4 L        Initial Exercise Prescription:     Initial Exercise Prescription - 02/26/16 0700    Date of Initial Exercise RX and Referring Provider   Date 02/26/16   Oxygen   Oxygen Continuous   Liters 4   Arm Ergometer   Level 1   Watts 13   Minutes 15   Track   Laps 3   Minutes 15   Prescription Details   Frequency (times per week) 2   Duration Progress to 45 minutes of aerobic exercise without signs/symptoms of physical distress   Intensity   THRR 40-80% of Max Heartrate 60-120   Ratings of Perceived Exertion 11-13   Perceived Dyspnea 0-4   Progression   Progression Continue progressive overload as per policy without signs/symptoms or physical distress.   Resistance Training   Training Prescription Yes   Weight blue bands   Reps 10-12      Perform Capillary Blood Glucose checks as needed.  Exercise Prescription Changes:     Exercise Prescription Changes      03/02/16 1500 03/04/16 1500 03/09/16 1500 03/11/16 1500 03/18/16 1500   Exercise Review   Progression    Yes    Response to Exercise   Blood Pressure (Admit) 120/66 mmHg 130/66 mmHg 134/70 mmHg 124/62 mmHg 120/50 mmHg   Blood Pressure (Exercise) 130/70 mmHg 140/70 mmHg 140/80 mmHg 120/70 mmHg 150/80 mmHg   Blood Pressure (Exit) 124/56 mmHg 120/64 mmHg 110/60 mmHg 112/60 mmHg 130/62 mmHg   Heart Rate (Admit) 72 bpm 72 bpm 81 bpm 78 bpm 88 bpm   Heart Rate (Exercise) 106 bpm 88 bpm 92 bpm 90 bpm 85 bpm   Heart Rate (Exit) 82 bpm 75 bpm 84 bpm 79 bpm 72 bpm   Oxygen Saturation (Admit) 91 % 88 % 94 % 85 % 93 %   Oxygen Saturation (Exercise) 80 % 85 % 85 % 83 % 86 %   Oxygen Saturation (Exit) 95 % 93 % 96 % 96 % 96 %   Rating of Perceived Exertion  (Exercise) '15 13 13 15 12   '$ Perceived Dyspnea (Exercise) '2 2 3 3 2   '$ Duration Progress to 45 minutes of aerobic exercise without signs/symptoms of physical distress Progress to 45 minutes of aerobic exercise without signs/symptoms of physical distress Progress to 45 minutes of aerobic exercise without signs/symptoms of physical distress Progress to 45 minutes of aerobic exercise without signs/symptoms of physical distress Progress to 45 minutes of aerobic exercise without signs/symptoms of physical distress   Intensity Other (comment)  40-80% HRR THRR unchanged THRR unchanged THRR unchanged THRR unchanged   Progression   Progression Continue to progress workloads to maintain intensity without signs/symptoms of physical distress. Continue to progress workloads to maintain intensity without signs/symptoms of physical distress. Continue to progress workloads to maintain intensity without signs/symptoms of physical distress. Continue to progress workloads to maintain intensity without signs/symptoms of physical  distress. Continue to progress workloads to maintain intensity without signs/symptoms of physical distress.   Resistance Training   Training Prescription Yes Yes Yes Yes Yes   Weight Blue bands blue bands blue bands blue bands blue bands   Reps 10-12 10-12 10-12 10-12 10-12   Interval Training   Interval Training    No No   Oxygen   Oxygen Continuous Continuous Continuous Continuous Continuous   Liters '4 4 4 6 6   '$ NuStep   Level '1  1 3    '$ Minutes '15  15 15    '$ METs 1.5  1.6 1.6    Arm Ergometer   Level '1 1 1 1 1   '$ Minutes '15 15 15 15 15   '$ Track   Laps '3 4 5 3 3   '$ Minutes '15 15 15 15 15     '$ 03/23/16 1500 03/25/16 1630 03/30/16 1525 04/01/16 1600     Exercise Review   Progression Yes  Yes     Response to Exercise   Blood Pressure (Admit) 122/70 mmHg 124/62 mmHg 120/60 mmHg 132/62 mmHg    Blood Pressure (Exercise) 130/58 mmHg 114/60 mmHg 136/70 mmHg 126/70 mmHg    Blood Pressure  (Exit) 130/70 mmHg 122/62 mmHg 128/64 mmHg 122/70 mmHg    Heart Rate (Admit) 71 bpm 80 bpm 75 bpm 74 bpm    Heart Rate (Exercise) 82 bpm 85 bpm 90 bpm 95 bpm    Heart Rate (Exit) 75 bpm 74 bpm 82 bpm 85 bpm    Oxygen Saturation (Admit) 95 % 89 % 96 % 94 %    Oxygen Saturation (Exercise) 82 % 80 %  increased to 86 with restbreak 93 % 89 %    Oxygen Saturation (Exit) 96 % 97 % 97 % 97 %    Rating of Perceived Exertion (Exercise) '11 13 15 13    '$ Perceived Dyspnea (Exercise) '3 2 2 2    '$ Duration Progress to 45 minutes of aerobic exercise without signs/symptoms of physical distress Progress to 45 minutes of aerobic exercise without signs/symptoms of physical distress Progress to 45 minutes of aerobic exercise without signs/symptoms of physical distress Progress to 45 minutes of aerobic exercise without signs/symptoms of physical distress    Intensity THRR unchanged THRR unchanged THRR unchanged THRR unchanged    Progression   Progression Continue to progress workloads to maintain intensity without signs/symptoms of physical distress. Continue to progress workloads to maintain intensity without signs/symptoms of physical distress. Continue to progress workloads to maintain intensity without signs/symptoms of physical distress. Continue to progress workloads to maintain intensity without signs/symptoms of physical distress.    Resistance Training   Training Prescription Yes Yes Yes Yes    Weight blue bands blue bands blue bands blue bands    Reps 10-12 10-12 10-12 10-12    Interval Training   Interval Training No No No No    Oxygen   Oxygen Continuous Continuous Continuous Continuous    Liters '6 6 6 6    '$ NuStep   Level '3 3 4     '$ Minutes '15 15 15     '$ METs 1.8 1.8 1.8     Arm Ergometer   Level '2  2 2    '$ Minutes '15  15 15    '$ Track   Laps '5 9 7 8    '$ Minutes '15 15 15 15       '$ Exercise Comments:     Exercise Comments      03/04/16  [id number] 04/01/16 0914         Exercise Comments Patient is  starting off well with exercise. Will cont. to monitor progression.  Patient is cont. to increase exercise intensity on the walking track, Nustep, and Arm Ergometer. Will continue to monitor and progress as appropriate.         Discharge Exercise Prescription (Final Exercise Prescription Changes):     Exercise Prescription Changes - 04/01/16 1600    Response to Exercise   Blood Pressure (Admit) 132/62 mmHg   Blood Pressure (Exercise) 126/70 mmHg   Blood Pressure (Exit) 122/70 mmHg   Heart Rate (Admit) 74 bpm   Heart Rate (Exercise) 95 bpm   Heart Rate (Exit) 85 bpm   Oxygen Saturation (Admit) 94 %   Oxygen Saturation (Exercise) 89 %   Oxygen Saturation (Exit) 97 %   Rating of Perceived Exertion (Exercise) 13   Perceived Dyspnea (Exercise) 2   Duration Progress to 45 minutes of aerobic exercise without signs/symptoms of physical distress   Intensity THRR unchanged   Progression   Progression Continue to progress workloads to maintain intensity without signs/symptoms of physical distress.   Resistance Training   Training Prescription Yes   Weight blue bands   Reps 10-12   Interval Training   Interval Training No   Oxygen   Oxygen Continuous   Liters 6   Arm Ergometer   Level 2   Minutes 15   Track   Laps 8   Minutes 15       Nutrition:  Target Goals: Understanding of nutrition guidelines, daily intake of sodium '1500mg'$ , cholesterol '200mg'$ , calories 30% from fat and 7% or less from saturated fats, daily to have 5 or more servings of fruits and vegetables.  Biometrics:     Pre Biometrics - 02/20/16 1203    Pre Biometrics   Grip Strength 35 kg       Nutrition Therapy Plan and Nutrition Goals:     Nutrition Therapy & Goals - 03/18/16 1530    Nutrition Therapy   Diet Therapeutic Lifestyle Changes   Personal Nutrition Goals   Personal Goal #1 0.5-2 lb wt loss/week to a goal wt loss of 6-24 lb at graduation from [place], educate and counsel regarding individualized specific dietary modifications aiming towards targeted core components such as weight, hypertension, lipid management, diabetes, heart failure and other comorbidities.;Nutrition handout(s) given to patient.  1800 kcal, 5-day menu ideas given   Expected Outcomes Short Term Goal: Understand basic principles of dietary content, such as calories, fat, sodium, cholesterol and nutrients.;Long Term Goal: Adherence to prescribed nutrition plan.      Nutrition Discharge: Rate Your Plate Scores:     Nutrition Assessments - 03/18/16 1522    Rate Your Plate Scores   Pre Score 54      Psychosocial: Target Goals: Acknowledge presence or absence of depression, maximize coping skills, provide positive support system. Participant is able to verbalize types and ability to use techniques and skills needed for reducing stress and depression.  Initial Review & Psychosocial Screening:     Initial Psych Review & Screening - 02/20/16 1126    Initial Review   Current issues with Current Depression;Current Anxiety/Panic   Family Dynamics   Good Support System? Yes   Barriers   Psychosocial barriers to participate in program The patient should benefit from training in stress management and relaxation.   Screening Interventions   Interventions Encouraged to  exercise      Quality of Life Scores:     Quality of Life - 02/26/16 1159    Quality of Life Scores   Health/Function Pre 22.4 %   Socioeconomic Pre 28 %   Psych/Spiritual Pre 22.29 %   Family Pre 19.5 %   GLOBAL Pre 23.49 %      PHQ-9:     Recent Review Flowsheet Data    Depression screen [facility] 2/9 03/11/2016 02/20/2016 01/28/2016 01/28/2016 11/13/2015   Decreased Interest 3 3 0 0 0   Down, Depressed, Hopeless '1 1 1 1 '$ 0   PHQ - 2 Score '4 4 1 1 '$ 0   Altered sleeping 1 1 - - -   Tired, decreased energy 3 3 - - -   Change in appetite 0 0 - - -   Feeling bad or failure about  yourself  0 - - - -   Trouble concentrating 3 3 - - -   Moving slowly or fidgety/restless 0 0 - - -   Suicidal thoughts 0 0 - - -   PHQ-9 Score 11 11 - - -   Difficult doing work/chores Somewhat difficult Somewhat difficult - - -      Psychosocial Evaluation and Intervention:     Psychosocial Evaluation - 02/20/16 1129    Psychosocial Evaluation & Interventions   Interventions Encouraged to exercise with the program and follow exercise prescription      Psychosocial Re-Evaluation:     Psychosocial Re-Evaluation      03/30/16 1709           Psychosocial Re-Evaluation   Interventions Encouraged to attend Pulmonary Rehabilitation for the exercise       Comments --  no psychsocial issues identified at this time       Continued Psychosocial Services Needed No         Education: Education Goals: Education classes will be provided on a weekly basis, covering required topics. Participant will state understanding/return demonstration of topics presented.  Learning Barriers/Preferences:     Learning Barriers/Preferences - 02/20/16 1126    Learning Barriers/Preferences   Learning Barriers None   Learning Preferences Individual Instruction;Skilled Demonstration      Education Topics: Risk Factor Reduction:  -Group instruction that is supported by a PowerPoint presentation. Instructor discusses the definition of a risk factor, different risk factors for pulmonary disease, and how the heart and lungs work together.     Nutrition for Pulmonary Patient:  -Group instruction provided by PowerPoint slides, verbal discussion, and written materials to support subject matter. The instructor gives an explanation and review of healthy diet recommendations, which includes a discussion on weight management, recommendations for fruit and vegetable consumption, as well as protein, fluid, caffeine, fiber, sodium, sugar, and alcohol. Tips for eating when patients are short of breath are  discussed.   Pursed Lip Breathing:  -Group instruction that is supported by demonstration and informational handouts. Instructor discusses the benefits of pursed lip and diaphragmatic breathing and detailed demonstration on how to preform both.     Oxygen Safety:  -Group instruction provided by PowerPoint, verbal discussion, and written material to support subject matter. There is an overview of "What is Oxygen" and "Why do we need it".  Instructor also reviews how to create a safe environment for oxygen use, the importance of using oxygen as prescribed, and the risks of noncompliance. There is a brief discussion on traveling with oxygen and resources the patient may utilize.   Oxygen  Equipment:  -Group instruction provided by [employer] Staff utilizing handouts, written materials, and equipment demonstrations.          PULMONARY REHAB OTHER RESPIRATORY from 04/01/2016 in [place]   Date  03/04/16   Educator  RT   Instruction Review Code  2- meets goals/outcomes      Signs and Symptoms:  -Group instruction provided by written material and verbal discussion to support subject matter. Warning signs and symptoms of infection, stroke, and heart attack are reviewed and when to call the physician/911 reinforced. Tips for preventing the spread of infection discussed.   Advanced Directives:  -Group instruction provided by verbal instruction and written material to support subject matter. Instructor reviews Advanced Directive laws and proper instruction for filling out document.      PULMONARY REHAB OTHER RESPIRATORY from 04/01/2016 in [place]   Date  04/01/16   Educator  Jeanella Craze   Instruction Review Code  2- meets goals/outcomes      Pulmonary Video:  -Group video education that reviews the importance of medication and oxygen compliance, exercise, good nutrition, pulmonary hygiene, and pursed lip and diaphragmatic  breathing for the pulmonary patient.   Exercise for the Pulmonary Patient:  -Group instruction that is supported by a PowerPoint presentation. Instructor discusses benefits of exercise, core components of exercise, frequency, duration, and intensity of an exercise routine, importance of utilizing pulse oximetry during exercise, safety while exercising, and options of places to exercise outside of rehab.        PULMONARY REHAB OTHER RESPIRATORY from 04/01/2016 in [place]   Date  03/18/16   Educator  EP   Instruction Review Code  2- meets goals/outcomes      Pulmonary Medications:  -Verbally interactive group education provided by instructor with focus on inhaled medications and proper administration.   Anatomy and Physiology of the Respiratory System and Intimacy:  -Group instruction provided by PowerPoint, verbal discussion, and written material to support subject matter. Instructor reviews respiratory cycle and anatomical components of the respiratory system and their functions. Instructor also reviews differences in obstructive and restrictive respiratory diseases with examples of each. Intimacy, Sex, and Sexuality differences are reviewed with a discussion on how relationships can change when diagnosed with pulmonary disease. Common sexual concerns are reviewed.      PULMONARY REHAB OTHER RESPIRATORY from 04/01/2016 in [place]   Date  03/25/16   Educator  RN   Instruction Review Code  2- meets goals/outcomes      Knowledge Questionnaire Score:     Knowledge Questionnaire Score - 02/26/16 1158    Knowledge Questionnaire Score   Pre Score 12/13      Core Components/Risk Factors/Patient Goals at Admission:     Personal Goals and Risk Factors at Admission - 02/20/16 1130    Core Components/Risk Factors/Patient Goals on Admission    Weight Management (p) Obesity;Weight Loss;Yes   Intervention (p) Obesity:  Provide education and appropriate resources to help participant work on and attain dietary goals.;Weight Management/Obesity: Establish reasonable short term and long term weight goals.   Expected Outcomes (p) Short Term: Continue to assess and modify interventions until short term weight is achieved;Long Term: Adherence to nutrition and physical activity/exercise program aimed toward attainment of established weight goal;Weight Loss: Understanding of general recommendations for a balanced deficit meal plan, which promotes 1-2 lb weight loss per week and includes a negative energy  balance of [phone] kcal/d;Understanding recommendations for meals to include 15-35% energy as protein, 25-35% energy from fat, 35-60% energy from carbohydrates, less than '200mg'$  of dietary cholesterol, 20-35 gm of total fiber daily;Understanding of distribution of calorie intake throughout the day with the consumption of 4-5 meals/snacks   Sedentary (p) Yes   Intervention (p) Provide advice, education, support and counseling about physical activity/exercise needs.;Develop an individualized exercise prescription for aerobic and resistive training based on initial evaluation findings, risk stratification, comorbidities and participant's personal goals.   Expected Outcomes (p) Achievement of increased cardiorespiratory fitness and enhanced flexibility, muscular endurance and strength shown through measurements of functional capacity and personal statement of participant.   Increase Strength and Stamina (p) Yes   Intervention (p) Provide advice, education, support and counseling about physical activity/exercise needs.;Develop an individualized exercise prescription for aerobic and resistive training based on initial evaluation findings, risk stratification, comorbidities and participant's personal goals.   Expected Outcomes (p) Achievement of increased cardiorespiratory fitness and enhanced flexibility, muscular endurance and strength  shown through measurements of functional capacity and personal statement of participant.   Improve shortness of breath with ADL's (p) Yes   Intervention (p) Provide education, individualized exercise plan and daily activity instruction to help decrease symptoms of SOB with activities of daily living.   Expected Outcomes (p) Short Term: Achieves a reduction of symptoms when performing activities of daily living.   Develop more efficient breathing techniques such as purse lipped breathing and diaphragmatic breathing; and practicing self-pacing with activity (p) Yes   Intervention (p) Provide education, demonstration and support about specific breathing techniuqes utilized for more efficient breathing. Include techniques such as pursed lipped breathing, diaphragmatic breathing and self-pacing activity.   Expected Outcomes (p) Short Term: Participant will be able to demonstrate and use breathing techniques as needed throughout daily activities.   Increase knowledge of respiratory medications and ability to use respiratory devices properly  (p) Yes   Intervention (p) Provide education and demonstration as needed of appropriate use of medications, inhalers, and oxygen therapy.   Expected Outcomes (p) Short Term: Achieves understanding of medications use. Understands that oxygen is a medication prescribed by physician. Demonstrates appropriate use of inhaler and oxygen therapy.   Hypertension (p) Yes   Intervention (p) Provide education on lifestyle modifcations including regular physical activity/exercise, weight management, moderate sodium restriction and increased consumption of fresh fruit, vegetables, and low fat dairy, alcohol moderation, and smoking cessation.;Monitor prescription use compliance.   Expected Outcomes (p) Short Term: Continued assessment and intervention until BP is < 140/40m HG in hypertensive participants. < 130/831mHG in hypertensive participants with diabetes, heart failure or  chronic kidney disease.;Long Term: Maintenance of blood pressure at goal levels.   Stress (p) Yes   Intervention (p) Offer individual and/or small group education and counseling on adjustment to heart disease, stress management and health-related lifestyle change. Teach and support self-help strategies.;Refer participants experiencing significant psychosocial distress to appropriate mental health specialists for further evaluation and treatment. When possible, include family members and significant others in education/counseling sessions.   Expected Outcomes (p) Short Term: Participant demonstrates changes in health-related behavior, relaxation and other stress management skills, ability to obtain effective social support, and compliance with psychotropic medications if prescribed.;Long Term: Emotional wellbeing is indicated by absence of clinically significant psychosocial distress or social isolation.   Personal Goal Other (p) Yes      Core Components/Risk Factors/Patient Goals Review:      Goals and Risk Factor Review  03/18/16 1522 03/30/16 1707         Core Components/Risk Factors/Patient Goals Review   Personal Goals Review Weight Management/Obesity       Review see 03/18/16 RD note --  workloads have been increased as his strength and stamina increase, purse lip breathing technique is improving      Expected Outcomes 0.5-2 lb wt loss/week to a goal wt loss of 6-24 lb at graduation from Pulmonary Rehab --  Continued increase in strength and stamina with a decrese in SOB         Core Components/Risk Factors/Patient Goals at Discharge (Final Review):      Goals and Risk Factor Review - 03/30/16 1707    Core Components/Risk Factors/Patient Goals Review   Review --  workloads have been increased as his strength and stamina increase, purse lip breathing technique is improving   Expected Outcomes --  Continued increase in strength and stamina with a decrese in SOB      ITP  Comments:   Comments: ITP REVIEW Pt is making expected progress toward personal goals after completing 9sessions.   Recommend continued exercise, life style modification, education, and utilization of breathing techniques to increase stamina and strength and decrease shortness of breath with exertion.

## 2016-04-01 NOTE — Telephone Encounter (Signed)
Per SN: yes, okay to tell patient it's okay to have a different POC for his use.  What was the result w/ an oxymizer cannula?  Pulm rehab is closed, will need to call back tomorrow.

## 2016-04-02 NOTE — Telephone Encounter (Signed)
Spoke with [place], using 6 liters continuous O2 at rehab keeping O2 above 88% -- anything lower the patient desats deep into the 80's. Pts concentrator at home only goes up to 5 Liters and [name] notes that during Pulmonary Rehab if they drop his O2 down to 4 liters then his O2 dips into the low 80's.  [name] states that the patient is prescribed to use 3 Liters continuous at home on concentrator and given his experience with Rehab, he needs a higher amount of O2 at home.  Pt was advised by [name] to use 5 liters O2 at home with exertion on concentrator (continuous flow).  Please advise [name] if you agree. Thanks.

## 2016-04-02 NOTE — Telephone Encounter (Signed)
Per SN >> I agree with the below.

## 2016-04-02 NOTE — Telephone Encounter (Signed)
Called Pulmonary rehab. [name] was with pt's. She will call us back when she gets a chance.

## 2016-04-02 NOTE — Telephone Encounter (Signed)
lmtcb x1 for [name] 

## 2016-04-05 NOTE — Telephone Encounter (Signed)
Spoke with Thayer Headings at [employer], aware of below recs.  Nothing further needed

## 2016-04-06 ENCOUNTER — Encounter (HOSPITAL_COMMUNITY): Payer: Medicare Other

## 2016-04-07 ENCOUNTER — Ambulatory Visit: Payer: Self-pay | Admitting: *Deleted

## 2016-04-08 ENCOUNTER — Encounter (HOSPITAL_COMMUNITY)
Admission: RE | Admit: 2016-04-08 | Discharge: 2016-04-08 | Disposition: A | Discharge status: Home / Self Care | Payer: Medicare Other | Source: Ambulatory Visit | Attending: Pulmonary Disease | Admitting: Pulmonary Disease

## 2016-04-08 VITALS — Wt 234.1 lb

## 2016-04-08 DIAGNOSIS — J438 Other emphysema: Secondary | ICD-10-CM

## 2016-04-08 DIAGNOSIS — Z7982 Long term (current) use of aspirin: Secondary | ICD-10-CM | POA: Diagnosis not present

## 2016-04-08 DIAGNOSIS — Z87891 Personal history of nicotine dependence: Secondary | ICD-10-CM | POA: Diagnosis not present

## 2016-04-08 DIAGNOSIS — J449 Chronic obstructive pulmonary disease, unspecified: Secondary | ICD-10-CM | POA: Diagnosis not present

## 2016-04-08 DIAGNOSIS — Z79899 Other long term (current) drug therapy: Secondary | ICD-10-CM | POA: Diagnosis not present

## 2016-04-08 DIAGNOSIS — I2510 Atherosclerotic heart disease of native coronary artery without angina pectoris: Secondary | ICD-10-CM | POA: Diagnosis not present

## 2016-04-08 NOTE — Progress Notes (Signed)
Daily Session Note  Patient Details  Name: [name] MRN: [id number] Date of Birth: [birth year] Referring Provider:    Encounter Date: 04/08/2016  Check In:     Session Check In - 04/08/16 1352    Check-In   Location [facility]   Staff Present Rosebud Poles, RN, BSN;Molly diVincenzo, MS, ACSM RCEP, Exercise Physiologist;Lisa Ysidro Evert, RN;Portia Rollene Rotunda, RN, BSN   Supervising physician immediately available to respond to emergencies [facility] immediately available   Physician(s) Dr. Eliseo Squires   Medication changes reported     No   Fall or balance concerns reported    No   Warm-up and Cool-down Performed as group-led instruction   Resistance Training Performed Yes   VAD Patient? No   Pain Assessment   Currently in Pain? No/denies   Multiple Pain Sites No      Capillary Blood Glucose: No results found for this or any previous visit (from the past 24 hour(s)).      Exercise Prescription Changes - 04/08/16 1500    Exercise Review   Progression Yes   Response to Exercise   Blood Pressure (Admit) 128/62 mmHg   Blood Pressure (Exercise) 130/70 mmHg   Blood Pressure (Exit) 112/56 mmHg   Heart Rate (Admit) 88 bpm   Heart Rate (Exercise) 81 bpm   Heart Rate (Exit) 62 bpm   Oxygen Saturation (Admit) 94 %   Oxygen Saturation (Exercise) 86 %   Oxygen Saturation (Exit) 97 %   Rating of Perceived Exertion (Exercise) 13   Perceived Dyspnea (Exercise) 3   Duration Progress to 45 minutes of aerobic exercise without signs/symptoms of physical distress   Intensity THRR unchanged   Progression   Progression Continue to progress workloads to maintain intensity without signs/symptoms of physical distress.   Resistance Training   Training Prescription Yes   Weight blue bands   Reps 10-12   Interval Training   Interval Training No   Oxygen   Oxygen Continuous   Liters 6   NuStep   Level 5   Minutes 15   Arm Ergometer   Level 3   Minutes 15     Goals Met:   Using PLB without cueing & demonstrates good technique Exercise tolerated well Strength training completed today  Goals Unmet:  Not Applicable  Comments: Service time is from 1330 to 1515    Dr. Rush Farmer is Medical Director for Pulmonary Rehab at [facility].

## 2016-04-09 ENCOUNTER — Other Ambulatory Visit: Payer: Self-pay | Admitting: *Deleted

## 2016-04-09 ENCOUNTER — Ambulatory Visit: Payer: Self-pay | Admitting: *Deleted

## 2016-04-09 ENCOUNTER — Telehealth: Payer: Self-pay | Admitting: Pulmonary Disease

## 2016-04-09 DIAGNOSIS — J449 Chronic obstructive pulmonary disease, unspecified: Secondary | ICD-10-CM

## 2016-04-09 DIAGNOSIS — I272 Pulmonary hypertension, unspecified: Secondary | ICD-10-CM

## 2016-04-09 NOTE — Telephone Encounter (Signed)
Spoke with [employer] @ Pulm Rehab. She states that pt is still on POC when ambulating and that he is dropping to the low 80's, 82-83% while on POC. She wanted to know if we were aware of pt's possible need for continuous O2 vs POC. [name] that order for conrinuous O2 eval was placed 03/24/16 and sent to [facility]. Per Cloyde Reams pt states that this has not been done. She is concerned that pt is not getting adequate O2 on the POC. [name] that I would call [facility] and see why pt had not had eval yet.  Spoke with [name] @ [facility]. She states that they have not done eval because pt cannot use oxymizer with POC. I advised her that the order was to Eval for continuous O2 with oxymizer. Not oxymizer for a POC. They also state that pt has told them he "will die with the POC on before he switches to tanks". I let [facility] know that regardless of pt's comments we still need eval for continuous O2 done asap. She states they will call and get him scheduled.   AT[facility] [name]. LMTCB

## 2016-04-09 NOTE — Patient Outreach (Signed)
[place] [facility]) Care Management  04/09/2016  [name] Apr 04, 1946 [id number]   RN Health Coach  Attempted #1  Follow up outreach call to patient.  Patient was unavailable. No voicemail answering service. [facility] [phone]

## 2016-04-09 NOTE — Patient Outreach (Signed)
[place] [facility]) Care Management  [place]  04/09/2016   [name] 06-19-1946 [id number]  Subjective: RN Health Coach returned telephone call to patient.  [employer] compliance verified. Per patient he is trying to stay mobile. He stated his oxygen saturation drops in the 80's when moving around. Per patient he can tell when it is dropping below 85%. Per patient the Dr told him that his new baseline for oxygen saturation is 88 percent. Patient stated that he did not want to be tied inside the house connected to a large concentrator. Per patient he likes to get up and mow his grass. He stated that he knows when to sit down and rest a little to get his O2 sat up to 85%. Patient stated that at rest it is currently 93-94% on 3 liters. Patient stated the Dr wanted to go up to 5 -6 liters with activity.  RN discussed with patient about community acquired pneumonia. Patient stated he would like more information about it. Patient stated his medications have been changed to all nebulizer instead on inhalers.Per patient he is taking his medications as per order. Patient is continuing his pulmonary rehab classes finishing in august. Patient is not having increase in mucus. Patient has agreed to follow up outreach calls.  Objective:   Encounter Medications:  Outpatient Encounter Prescriptions as of 04/09/2016  Medication Sig  . albuterol (PROVENTIL) (2.5 MG/3ML) 0.083% nebulizer solution Take 3 mLs (2.5 mg total) by nebulization 3 (three) times daily.  [name] ALPRAZolam (XANAX) 0.5 MG tablet Take 0.5 mg by mouth 3 (three) times daily as needed for anxiety or sleep.  [name] aspirin 81 MG tablet Take 81 mg by mouth at bedtime.   . budesonide (PULMICORT) 0.25 MG/2ML nebulizer solution Take 2 mLs (0.25 mg total) by nebulization 2 (two) times daily.  . Flaxseed, Linseed, (FLAXSEED OIL) 1000 MG CAPS Take 1 capsule by mouth daily.    . furosemide (LASIX) 40 MG tablet Take 40 mg by mouth 2 (two) times  daily.  [name] ibuprofen (ADVIL,MOTRIN) 600 MG tablet Take 600 mg by mouth every 6 (six) hours as needed for moderate pain.  [name] ipratropium-albuterol (DUONEB) 0.5-2.5 (3) MG/3ML SOLN Take 3 mLs by nebulization 4 (four) times daily.  . isosorbide mononitrate (IMDUR) 30 MG 24 hr tablet Take 1 tablet (30 mg total) by mouth daily.  [name] lisinopril (PRINIVIL,ZESTRIL) 10 MG tablet Take 1 tablet (10 mg total) by mouth daily.  . metoprolol succinate (TOPROL-XL) 25 MG 24 hr tablet TAKE 2 TABLETS BY MOUTH IN THE MORNING AND 1 TABLET BY MOUTH IN THE THE EVENING  . Multiple Vitamin (MULTIVITAMIN) capsule Take 1 capsule by mouth daily.    . nitroGLYCERIN (NITROSTAT) 0.4 MG SL tablet Place 1 tablet (0.4 mg total) under the tongue every 5 (five) minutes as needed for chest pain.  . Omega-3 Fatty Acids (FISH OIL) 1200 MG CAPS Take 1 capsule by mouth daily.    . simvastatin (ZOCOR) 20 MG tablet Take 1 tablet (20 mg total) by mouth daily at 6 PM.  . sodium chloride (OCEAN) 0.65 % SOLN nasal spray Place 1 spray into both nostrils as needed for congestion.  [name] albuterol (PROVENTIL HFA;VENTOLIN HFA) 108 (90 BASE) MCG/ACT inhaler Inhale 2 puffs into the lungs 4 (four) times daily as needed for wheezing (for use when not at home / unable to use nebulizer). (Patient not taking: Reported on 04/09/2016)   No facility-administered encounter medications on file as of 04/09/2016.  Functional Status:  In your present state of health, do you have any difficulty performing the following activities: 04/09/2016 03/11/2016  Hearing? N Y  Vision? N N  Difficulty concentrating or making decisions? N N  Walking or climbing stairs? Y Y  Dressing or bathing? N N  Doing errands, shopping? [name]  Preparing Food and eating ? Y Y  Using the Toilet? N N  In the past six months, have you accidently leaked urine? N N  Do you have problems with loss of bowel control? N N  Managing your Medications? N N  Managing your Finances? N N  Housekeeping or  managing your Housekeeping? [name]    Fall/Depression Screening: PHQ 2/9 Scores 04/09/2016 03/11/2016 02/20/2016 01/28/2016 01/28/2016 11/13/2015 06/05/2013  PHQ - 2 Score '1 4 4 1 1 '$ 0 0  PHQ- 9 Score - 11 11 - - - -   THN CM Care Plan Problem One        Most Recent Value   Care Plan Problem One  Knowledge deficit in self management of COPD   Role Documenting the Problem One  [place] for Problem One  Active   THN Long Term Goal (31-90 days)  Patient will continue to not have any readmission for COPD within the next 90 days   THN Long Term Goal Start Date  04/09/16   Interventions for Problem One [place] reminded patient to keep his appointments with PCP and pulmonologist. Foreman will monitor patient by monthly telephonic. RN Health Coach reminded patient the importance of taking medication as per physician order.   THN CM Short Term Goal #1 (0-30 days)  Patient will be able to verbalize  3 signs of COPD within the next 30 days   THN CM Short Term Goal #1 Start Date  04/09/16 [ continous Shelly Bombard goes over his symptoms. Patient is at a at the borderline of green and yellow but this is his usual area. He is very short of breath on min exertion]   THN CM Short Term Goal #2 (0-30 days)  Patient will be able to verbalize eating healthier foods within the next 30 days   THN CM Short Term Goal #2 Start Date  04/09/16 [ongoing. RN discussed with patient about eating healthy and limiting na]   Interventions for Short Term Goal #2  Patient instructed to eat smaller meals more often. Patient instructed to rest between meals. RN sent educational material on low sodium diet and  Dash eating plan.   THN CM Short Term Goal #3 (0-30 days)  Patient will be able to verbalize that the periphal edema is decreasing in lower extremities   THN CM Short Term Goal #3 Start Date  04/09/16 [ongoing. Patient continues to have fluid in extremities]   Interventions for Short Tern Goal #3   RN sent patient educational material on signs and symptoms of peripheral edema. RN discussed with patient about decreasing sodium in diet wil help. RN will follow up with discussion.      Assessment:  Patient will continue to  benefit from Health Coach telephonic outreach for education and support for diabetes self management.  Plan: RN sent patient educational material on community acquired pneumonia RN sent patient educational material on cold and flu RN will follow up outreach within 30 days  [place] Management [phone]

## 2016-04-13 ENCOUNTER — Encounter (HOSPITAL_COMMUNITY)
Admission: RE | Admit: 2016-04-13 | Discharge: 2016-04-13 | Disposition: A | Discharge status: Home / Self Care | Payer: Medicare Other | Source: Ambulatory Visit | Attending: Pulmonary Disease | Admitting: Pulmonary Disease

## 2016-04-13 DIAGNOSIS — Z87891 Personal history of nicotine dependence: Secondary | ICD-10-CM | POA: Diagnosis not present

## 2016-04-13 DIAGNOSIS — Z79899 Other long term (current) drug therapy: Secondary | ICD-10-CM | POA: Diagnosis not present

## 2016-04-13 DIAGNOSIS — Z7982 Long term (current) use of aspirin: Secondary | ICD-10-CM | POA: Diagnosis not present

## 2016-04-13 DIAGNOSIS — J438 Other emphysema: Secondary | ICD-10-CM | POA: Diagnosis not present

## 2016-04-13 DIAGNOSIS — I2510 Atherosclerotic heart disease of native coronary artery without angina pectoris: Secondary | ICD-10-CM | POA: Diagnosis not present

## 2016-04-13 DIAGNOSIS — J449 Chronic obstructive pulmonary disease, unspecified: Secondary | ICD-10-CM | POA: Diagnosis not present

## 2016-04-13 NOTE — Progress Notes (Signed)
Daily Session Note  Patient Details  Name: [name] MRN: [id number] Date of Birth: 02-22-1946 Referring Provider:    Encounter Date: 04/13/2016  Check In:     Session Check In - 04/13/16 1336    Check-In   Location [facility]   Staff Present Rosebud Poles, RN, Luisa Hart, RN, BSN;Ramon Dredge, RN, MHA;Kate Sweetman, MS, ACSM RCEP, Exercise Physiologist   Supervising physician immediately available to respond to emergencies [facility] immediately available   Physician(s) Dr. Marily Memos   Medication changes reported     No   Fall or balance concerns reported    No   Warm-up and Cool-down Performed as group-led instruction   Resistance Training Performed Yes   VAD Patient? No   Pain Assessment   Currently in Pain? No/denies   Multiple Pain Sites No      Capillary Blood Glucose: No results found for this or any previous visit (from the past 24 hour(s)).      Exercise Prescription Changes - 04/13/16 1500    Exercise Review   Progression Yes   Response to Exercise   Blood Pressure (Admit) 120/66 mmHg   Blood Pressure (Exercise) 146/70 mmHg   Blood Pressure (Exit) 120/60 mmHg   Heart Rate (Admit) 87 bpm   Heart Rate (Exercise) 104 bpm   Heart Rate (Exit) 86 bpm   Oxygen Saturation (Admit) 87 %   Oxygen Saturation (Exercise) 85 %   Oxygen Saturation (Exit) 96 %   Rating of Perceived Exertion (Exercise) 15   Perceived Dyspnea (Exercise) 3   Duration Progress to 45 minutes of aerobic exercise without signs/symptoms of physical distress   Intensity THRR unchanged   Progression   Progression Continue to progress workloads to maintain intensity without signs/symptoms of physical distress.   Resistance Training   Training Prescription Yes   Weight blue bands   Reps 10-12   Interval Training   Interval Training No   Oxygen   Oxygen Continuous   Liters 6   NuStep   Level 6   Minutes 15   Arm Ergometer   Level 3   Minutes 15    Track   Laps 6   Minutes 15     Goals Met:  Exercise tolerated well No report of cardiac concerns or symptoms Strength training completed today  Goals Unmet:  O2 Sat  Comments: Service time is from 1:30pm to 3:00pm    Dr. Rush Farmer is Medical Director for Pulmonary Rehab at [facility].

## 2016-04-14 ENCOUNTER — Encounter: Payer: Self-pay | Admitting: *Deleted

## 2016-04-14 NOTE — Telephone Encounter (Signed)
LM for Cloyde Reams, will return to office 04/15/16

## 2016-04-15 ENCOUNTER — Encounter (HOSPITAL_COMMUNITY): Payer: Medicare Other

## 2016-04-15 NOTE — Telephone Encounter (Signed)
Per Cloyde Reams, need order placed for Oxymizer to be used at Pulmonary Rehab. Cloyde Reams is aware of the below per [facility] and patient - states that we cannot force the patient to switch to tanks from his POC if he does not want to. The only issue is that the Oxymizer cannot be used with a POC so the patient will not have access to it for exertional activities at home - he will only have it for Pulmonary Rehab. Cloyde Reams states that she has explained in great detail to the patient what risk he is placing in not wanting to give up his POC - Cloyde Reams states that she he is not stable enough to exercise at home and she has not worked with him on any home exercises d/t this. Pt is unable to use an Oxymizer at home with his POC and needs tanks but patient again refuses.  Order to be placed for oxymizer if SN agree.

## 2016-04-15 NOTE — Telephone Encounter (Signed)
Ok per SN to order Oxymizer. Order placed. Nothing further needed.

## 2016-04-15 NOTE — Progress Notes (Signed)
Cardiology Office Note:    Date:  04/16/2016   ID:  [name], DOB 1946/01/13, MRN [id number]  PCP:  Andrew Hatchet, MD  Cardiologist:  [name]   Electrophysiologist:  n/a  Referring MD: Andrew Hatchet, MD   Chief Complaint  Patient presents with  . Follow-up    Leg edema    History of Present Illness:     [name] is a 70 y.o. male with a hx of CAD status post remote inferior MI in 1995. He has undergone multiple PCI procedures. Most recent PCI was in 2009 (DES 3, overlapping in the RCA). Other history includes COPD, tobacco abuse, HTN, HL, bladder CA. Last seen by Dr. Burt [name] 4/15.  He was admitted in 12/16 with hypoxic respiratory failure secondary to AECOPD.  He was recently seen by his PCP for edema. He was placed on Lasix.  I saw him last month and stopped his Amlodipine and started Isosorbide.  He returns for FU.  His leg edema is resolved.  He is doing well.  Dyspnea is somewhat improved.  Denies chest pain. No syncope. No PND.     Past Medical History  Diagnosis Date  . History of sinusitis   . Epistaxis   . COPD (chronic obstructive pulmonary disease) ([place])   . Cigarette smoker   . Hypertension   . CAD (coronary artery disease)   . Hypercholesteremia   . Borderline diabetes mellitus   . Obesity   . History of colonic polyps 2004    hyperplastic   . DJD (degenerative joint disease)   . Anxiety   . Bladder cancer ([place]) 2011  . Emphysema of lung [facility])     Past Surgical History  Procedure Laterality Date  . Cataract surgery/sub posterior vitrectomy in left eye  1996    Dr. Zadie [name]  . Cataract surgery  septemeber 2013  . [facility]  [phone]    Current Medications: Outpatient Prescriptions Prior to Visit  Medication Sig Dispense Refill  . albuterol (PROVENTIL HFA;VENTOLIN HFA) 108 (90 BASE) MCG/ACT inhaler Inhale 2 puffs into the lungs 4 (four) times daily as needed for wheezing (for use when not at home / unable to use  nebulizer). 1 Inhaler 0  . albuterol (PROVENTIL) (2.5 MG/3ML) 0.083% nebulizer solution Take 3 mLs (2.5 mg total) by nebulization 3 (three) times daily. 360 mL 3  . ALPRAZolam (XANAX) 0.5 MG tablet Take 0.5 mg by mouth 3 (three) times daily as needed for anxiety or sleep.    [name] aspirin 81 MG tablet Take 81 mg by mouth at bedtime.     . budesonide (PULMICORT) 0.25 MG/2ML nebulizer solution Take 2 mLs (0.25 mg total) by nebulization 2 (two) times daily. 60 mL 5  . Flaxseed, Linseed, (FLAXSEED OIL) 1000 MG CAPS Take 1 capsule by mouth daily.      . furosemide (LASIX) 40 MG tablet Take 40 mg by mouth 2 (two) times daily.    [name] ibuprofen (ADVIL,MOTRIN) 600 MG tablet Take 600 mg by mouth every 6 (six) hours as needed for moderate pain.    [name] ipratropium-albuterol (DUONEB) 0.5-2.5 (3) MG/3ML SOLN Take 3 mLs by nebulization 4 (four) times daily. 120 mL 5  . isosorbide mononitrate (IMDUR) 30 MG 24 hr tablet Take 1 tablet (30 mg total) by mouth daily. 30 tablet 11  . lisinopril (PRINIVIL,ZESTRIL) 10 MG tablet Take 1 tablet (10 mg total) by mouth daily. 30 tablet 0  . metoprolol succinate (TOPROL-XL) 25 MG 24 hr tablet  TAKE 2 TABLETS BY MOUTH IN THE MORNING AND 1 TABLET BY MOUTH IN THE THE EVENING    . Multiple Vitamin (MULTIVITAMIN) capsule Take 1 capsule by mouth daily.      . nitroGLYCERIN (NITROSTAT) 0.4 MG SL tablet Place 1 tablet (0.4 mg total) under the tongue every 5 (five) minutes as needed for chest pain. 25 tablet 3  . Omega-3 Fatty Acids (FISH OIL) 1200 MG CAPS Take 1 capsule by mouth daily.      . simvastatin (ZOCOR) 20 MG tablet Take 1 tablet (20 mg total) by mouth daily at 6 PM. 30 tablet 2  . sodium chloride (OCEAN) 0.65 % SOLN nasal spray Place 1 spray into both nostrils as needed for congestion.  0   No facility-administered medications prior to visit.      Allergies:   Review of patient's allergies indicates no known allergies.   Social History   Social History  . Marital Status:  Married    Spouse Name: [name]  . Number of Children: 2  . Years of Education: N/A   Occupational History  . Retired    Social History Main Topics  . Smoking status: Former Smoker -- 2.00 packs/day for 45 years    Types: Cigarettes    Quit date: 10/17/2015  . Smokeless tobacco: Never Used  . Alcohol Use: 0.0 oz/week    0 Standard drinks or equivalent per week     Comment: glass of wine at night  . Drug Use: No  . Sexual Activity: Not Asked   Other Topics Concern  . None   Social History Narrative     Family History:  The patient's family history includes Aneurysm in his brother; Cancer in his brother; Heart disease in his brother and mother.   ROS:   Please see the history of present illness.    ROS All other systems reviewed and are negative.   Physical Exam:    VS:  BP 126/76 mmHg  Pulse 68  Ht '5\' 6"'$  (1.676 m)  Wt 233 lb (105.688 kg)  BMI 37.63 kg/m2   Physical Exam  Constitutional: He is oriented to person, place, and time. He appears well-developed and well-nourished.  Wearing O2  HENT:  Head: Normocephalic and atraumatic.  Neck: Normal range of motion. No JVD present.  Cardiovascular: Normal rate and regular rhythm.  Exam reveals distant heart sounds.   No murmur heard. Pulmonary/Chest: He has no wheezes. He has no rales.  Decreased breath sounds  Abdominal: He exhibits distension.  Musculoskeletal: Normal range of motion. He exhibits no edema.  Neurological: He is alert and oriented to person, place, and time.  Skin: Skin is warm and dry.  Psychiatric: He has a normal mood and affect.    Wt Readings from Last 3 Encounters:  04/16/16 233 lb (105.688 kg)  04/08/16 234 lb 2.1 oz (106.2 kg)  04/01/16 233 lb 7.5 oz (105.9 kg)      Studies/Labs Reviewed:     EKG:  EKG is  ordered today.  The ekg ordered today demonstrates NSR, HR 69, normal axis, no changes, QTc 443 ms  Recent Labs: 10/20/2015: B Natriuretic Peptide 498.4* 10/22/2015: Magnesium  2.2 10/23/2015: ALT 26; Hemoglobin 14.3; Platelets 166 03/16/2016: BUN 12; Creat 0.72; Potassium 4.3; Sodium 139   Recent Lipid Panel    Component Value Date/Time   CHOL 138 10/22/2015 0309   TRIG 75 10/22/2015 0309   HDL 25* 10/22/2015 0309   CHOLHDL 5.5 10/22/2015 0309  VLDL 15 10/22/2015 0309   LDLCALC 98 10/22/2015 0309   LDLDIRECT 88.7 03/30/2010 0948    Additional studies/ records that were reviewed today include:   Echo 10/21/15 Mild LVH, EF 55-60%, no RWMA, MAC, mod LAE, mod to severe RAE, mod TR, PASP 51 mmHg  Myoview 2/13 Low risk stress nuclear study. Small inferior wall infarct from apex to base with no ischemia. EF preserved 58%  PCI 4/08 Cypher DES x 3 to RCA  [facility] 3/08 LAD prox 30%, mid 40% OM 40% RCA prox 90% ISR, then 80% ISR, mid 80% ISR, dist 90% EF 55%  ASSESSMENT:     1. Bilateral edema of lower extremity   2. Coronary artery disease involving native coronary artery of native heart without angina pectoris     PLAN:     In order of problems listed above:  1. Edema - Edema is resolved.  Suspect edema is multifactorial but more related to Amlodipine than right-sided heart failure or venous insufficiency.  No further workup. Continue current Rx.  2. CAD - Hx of remote MI and multiple PCI procedures. Last PCI in 2009 with DES x 3 to RCA. Myoview in 2013 was low risk. He denies any symptoms of angina. Continue aspirin, statin, nitrates.   Medication Adjustments/Labs and Tests Ordered: Current medicines are reviewed at length with the patient today.  Concerns regarding medicines are outlined above.  Medication changes, Labs and Tests ordered today are outlined in the Patient Instructions noted below. Patient Instructions  Medication Instructions:  No changes.  Continue your current medications.  See your medication list.  Labwork: None today.   Testing/Procedures: None   Follow-Up: [name] in 6 mos  Any Other Special Instructions  Will Be Listed Below (If Applicable). If you need a refill on your cardiac medications before your next appointment, please call your pharmacy.    Signed, Richardson Dopp, PA-C  04/16/2016 9:53 AM    [facility] [place], [place], [place]  [address] Phone: [phone]; Fax: [phone]

## 2016-04-16 ENCOUNTER — Encounter: Payer: Self-pay | Admitting: Physician Assistant

## 2016-04-16 ENCOUNTER — Ambulatory Visit (INDEPENDENT_AMBULATORY_CARE_PROVIDER_SITE_OTHER): Payer: Medicare Other | Admitting: Physician Assistant

## 2016-04-16 VITALS — BP 126/76 | HR 68 | Ht 66.0 in | Wt 233.0 lb

## 2016-04-16 DIAGNOSIS — R600 Localized edema: Secondary | ICD-10-CM | POA: Diagnosis not present

## 2016-04-16 DIAGNOSIS — I2510 Atherosclerotic heart disease of native coronary artery without angina pectoris: Secondary | ICD-10-CM | POA: Diagnosis not present

## 2016-04-16 NOTE — Patient Instructions (Addendum)
Medication Instructions:  No changes.  Continue your current medications.  See your medication list.  Labwork: None today.   Testing/Procedures: None   Follow-Up: Dr. Sherren Mocha in 6 mos  Any Other Special Instructions Will Be Listed Below (If Applicable). If you need a refill on your cardiac medications before your next appointment, please call your pharmacy.

## 2016-04-20 ENCOUNTER — Encounter (HOSPITAL_COMMUNITY)
Admission: RE | Admit: 2016-04-20 | Discharge: 2016-04-20 | Disposition: A | Discharge status: Home / Self Care | Payer: Medicare Other | Source: Ambulatory Visit | Attending: Pulmonary Disease | Admitting: Pulmonary Disease

## 2016-04-20 VITALS — Wt 235.9 lb

## 2016-04-20 DIAGNOSIS — Z87891 Personal history of nicotine dependence: Secondary | ICD-10-CM | POA: Diagnosis not present

## 2016-04-20 DIAGNOSIS — Z79899 Other long term (current) drug therapy: Secondary | ICD-10-CM | POA: Diagnosis not present

## 2016-04-20 DIAGNOSIS — I10 Essential (primary) hypertension: Secondary | ICD-10-CM | POA: Insufficient documentation

## 2016-04-20 DIAGNOSIS — Z7982 Long term (current) use of aspirin: Secondary | ICD-10-CM | POA: Insufficient documentation

## 2016-04-20 DIAGNOSIS — J438 Other emphysema: Secondary | ICD-10-CM | POA: Insufficient documentation

## 2016-04-20 DIAGNOSIS — Z86010 Personal history of colonic polyps: Secondary | ICD-10-CM | POA: Diagnosis not present

## 2016-04-20 DIAGNOSIS — E7800 Pure hypercholesterolemia, unspecified: Secondary | ICD-10-CM | POA: Diagnosis not present

## 2016-04-20 DIAGNOSIS — I2510 Atherosclerotic heart disease of native coronary artery without angina pectoris: Secondary | ICD-10-CM | POA: Insufficient documentation

## 2016-04-20 DIAGNOSIS — Z6837 Body mass index (BMI) 37.0-37.9, adult: Secondary | ICD-10-CM | POA: Diagnosis not present

## 2016-04-20 DIAGNOSIS — E669 Obesity, unspecified: Secondary | ICD-10-CM | POA: Diagnosis not present

## 2016-04-20 DIAGNOSIS — J449 Chronic obstructive pulmonary disease, unspecified: Secondary | ICD-10-CM | POA: Insufficient documentation

## 2016-04-20 NOTE — Progress Notes (Signed)
Daily Session Note  Patient Details  Name: [name] MRN: [id number] Date of Birth: [birth year] Referring Provider:    Encounter Date: 04/20/2016  Check In:     Session Check In - 04/20/16 1334    Check-In   Location [facility]   Staff Present Trish Fountain, RN, BSN;Ramon Dredge, RN, MHA;Molly diVincenzo, MS, ACSM RCEP, Exercise Physiologist;Joan Leonia Reeves, RN, BSN   Supervising physician immediately available to respond to emergencies [facility] immediately available   Physician(s) Dr. Marily Memos   Medication changes reported     No   Fall or balance concerns reported    No   Warm-up and Cool-down Performed as group-led instruction   Resistance Training Performed Yes   VAD Patient? No   Pain Assessment   Currently in Pain? No/denies   Multiple Pain Sites No      Capillary Blood Glucose: No results found for this or any previous visit (from the past 24 hour(s)).      Exercise Prescription Changes - 04/20/16 1500    Exercise Review   Progression Yes   Response to Exercise   Blood Pressure (Admit) 138/70 mmHg   Blood Pressure (Exercise) 142/60 mmHg   Blood Pressure (Exit) 104/60 mmHg   Heart Rate (Admit) 81 bpm   Heart Rate (Exercise) 92 bpm   Heart Rate (Exit) 82 bpm   Oxygen Saturation (Admit) 97 %   Oxygen Saturation (Exercise) 83 %  [place]at with walking recovered with rest   Oxygen Saturation (Exit) 96 %   Rating of Perceived Exertion (Exercise) 12   Perceived Dyspnea (Exercise) 1   Duration Progress to 45 minutes of aerobic exercise without signs/symptoms of physical distress   Intensity THRR unchanged   Progression   Progression Continue to progress workloads to maintain intensity without signs/symptoms of physical distress.   Resistance Training   Training Prescription Yes   Weight blue bands   Reps 10-12   Interval Training   Interval Training No   Oxygen   Oxygen Continuous   Liters 6   NuStep   Level 6   Minutes 15   Arm Ergometer   Level 4   Minutes 15   Track   Laps 6   Minutes 15     Goals Met:  No report of cardiac concerns or symptoms Strength training completed today  Goals Unmet:  O2 Sat  Comments: **Service time is from 1330 to 1500    Dr. Rush Farmer is Medical Director for Pulmonary Rehab at [facility].

## 2016-04-22 ENCOUNTER — Encounter (HOSPITAL_COMMUNITY)
Admission: RE | Admit: 2016-04-22 | Discharge: 2016-04-22 | Disposition: A | Discharge status: Home / Self Care | Payer: Medicare Other | Source: Ambulatory Visit | Attending: Pulmonary Disease | Admitting: Pulmonary Disease

## 2016-04-22 VITALS — Wt 235.5 lb

## 2016-04-22 DIAGNOSIS — J438 Other emphysema: Secondary | ICD-10-CM | POA: Diagnosis not present

## 2016-04-22 DIAGNOSIS — J449 Chronic obstructive pulmonary disease, unspecified: Secondary | ICD-10-CM | POA: Diagnosis not present

## 2016-04-22 DIAGNOSIS — Z79899 Other long term (current) drug therapy: Secondary | ICD-10-CM | POA: Diagnosis not present

## 2016-04-22 DIAGNOSIS — Z87891 Personal history of nicotine dependence: Secondary | ICD-10-CM | POA: Diagnosis not present

## 2016-04-22 DIAGNOSIS — Z7982 Long term (current) use of aspirin: Secondary | ICD-10-CM | POA: Diagnosis not present

## 2016-04-22 DIAGNOSIS — I2510 Atherosclerotic heart disease of native coronary artery without angina pectoris: Secondary | ICD-10-CM | POA: Diagnosis not present

## 2016-04-22 NOTE — Progress Notes (Signed)
Daily Session Note  Patient Details  Name: [name] MRN: [id number] Date of Birth: [birth year] Referring Provider:    Encounter Date: 04/22/2016  Check In:     Session Check In - 04/22/16 1349    Check-In   Location [facility]   Staff Present Su Hilt, MS, ACSM RCEP, Exercise Physiologist;Kimiyo Carmicheal Leonia Reeves, RN, BSN;Lisa Ysidro Evert, RN;Portia Rollene Rotunda, RN, BSN   Supervising physician immediately available to respond to emergencies [facility] immediately available   Physician(s) Dr. Marily Memos   Medication changes reported     No   Fall or balance concerns reported    No   Warm-up and Cool-down Performed as group-led instruction   Resistance Training Performed Yes   VAD Patient? No   Pain Assessment   Currently in Pain? No/denies   Multiple Pain Sites No      Capillary Blood Glucose: No results found for this or any previous visit (from the past 24 hour(s)).      Exercise Prescription Changes - 04/22/16 1600    Response to Exercise   Blood Pressure (Admit) 142/70 mmHg   Blood Pressure (Exercise) 126/60 mmHg   Blood Pressure (Exit) 150/76 mmHg   Heart Rate (Admit) 93 bpm   Heart Rate (Exercise) 90 bpm   Heart Rate (Exit) 84 bpm   Oxygen Saturation (Admit) 96 %   Oxygen Saturation (Exercise) 92 %   Oxygen Saturation (Exit) 97 %   Rating of Perceived Exertion (Exercise) 13   Perceived Dyspnea (Exercise) 2   Duration Progress to 45 minutes of aerobic exercise without signs/symptoms of physical distress   Intensity THRR unchanged   Progression   Progression Continue to progress workloads to maintain intensity without signs/symptoms of physical distress.   Resistance Training   Training Prescription Yes   Weight blue bands   Reps 10-12   Interval Training   Interval Training No   Oxygen   Oxygen Continuous   Liters 6   NuStep   Level 6   Minutes 15   METs 1.9   Arm Ergometer   Level 4   Minutes 15     Goals Met:  Exercise tolerated  well Strength training completed today  Goals Unmet:  Not Applicable  Comments: Service time is from 1330 to 1500    Dr. Rush Farmer is Medical Director for Pulmonary Rehab at [facility].

## 2016-04-27 ENCOUNTER — Encounter (HOSPITAL_COMMUNITY)
Admission: RE | Admit: 2016-04-27 | Discharge: 2016-04-27 | Disposition: A | Discharge status: Home / Self Care | Payer: Medicare Other | Source: Ambulatory Visit | Attending: Pulmonary Disease | Admitting: Pulmonary Disease

## 2016-04-27 DIAGNOSIS — Z79899 Other long term (current) drug therapy: Secondary | ICD-10-CM | POA: Diagnosis not present

## 2016-04-27 DIAGNOSIS — Z7982 Long term (current) use of aspirin: Secondary | ICD-10-CM | POA: Diagnosis not present

## 2016-04-27 DIAGNOSIS — J449 Chronic obstructive pulmonary disease, unspecified: Secondary | ICD-10-CM | POA: Diagnosis not present

## 2016-04-27 DIAGNOSIS — Z87891 Personal history of nicotine dependence: Secondary | ICD-10-CM | POA: Diagnosis not present

## 2016-04-27 DIAGNOSIS — I2510 Atherosclerotic heart disease of native coronary artery without angina pectoris: Secondary | ICD-10-CM | POA: Diagnosis not present

## 2016-04-27 DIAGNOSIS — J438 Other emphysema: Secondary | ICD-10-CM | POA: Diagnosis not present

## 2016-04-27 NOTE — Progress Notes (Signed)
Pulmonary Individual Treatment Plan  Patient Details  Name: [name] MRN: [id number] Date of Birth: [birth year] Referring Provider:    Initial Encounter Date:       Pulmonary Rehab Walk Test from 02/24/2016 in [place]   Date  02/26/16      Visit Diagnosis: No diagnosis found.  Patient's Home Medications on Admission:   Current outpatient prescriptions:  .  albuterol (PROVENTIL HFA;VENTOLIN HFA) 108 (90 BASE) MCG/ACT inhaler, Inhale 2 puffs into the lungs 4 (four) times daily as needed for wheezing (for use when not at home / unable to use nebulizer)., Disp: 1 Inhaler, Rfl: 0 .  albuterol (PROVENTIL) (2.5 MG/3ML) 0.083% nebulizer solution, Take 3 mLs (2.5 mg total) by nebulization 3 (three) times daily., Disp: 360 mL, Rfl: 3 .  ALPRAZolam (XANAX) 0.5 MG tablet, Take 0.5 mg by mouth 3 (three) times daily as needed for anxiety or sleep., Disp: , Rfl:  .  aspirin 81 MG tablet, Take 81 mg by mouth at bedtime. , Disp: , Rfl:  .  budesonide (PULMICORT) 0.25 MG/2ML nebulizer solution, Take 2 mLs (0.25 mg total) by nebulization 2 (two) times daily., Disp: 60 mL, Rfl: 5 .  Flaxseed, Linseed, (FLAXSEED OIL) 1000 MG CAPS, Take 1 capsule by mouth daily.  , Disp: , Rfl:  .  furosemide (LASIX) 40 MG tablet, Take 40 mg by mouth 2 (two) times daily., Disp: , Rfl:  .  ibuprofen (ADVIL,MOTRIN) 600 MG tablet, Take 600 mg by mouth every 6 (six) hours as needed for moderate pain., Disp: , Rfl:  .  ipratropium-albuterol (DUONEB) 0.5-2.5 (3) MG/3ML SOLN, Take 3 mLs by nebulization 4 (four) times daily., Disp: 120 mL, Rfl: 5 .  isosorbide mononitrate (IMDUR) 30 MG 24 hr tablet, Take 1 tablet (30 mg total) by mouth daily., Disp: 30 tablet, Rfl: 11 .  lisinopril (PRINIVIL,ZESTRIL) 10 MG tablet, Take 1 tablet (10 mg total) by mouth daily., Disp: 30 tablet, Rfl: 0 .  metoprolol succinate (TOPROL-XL) 25 MG 24 hr tablet, TAKE 2 TABLETS BY MOUTH IN THE MORNING AND 1 TABLET BY  MOUTH IN THE THE EVENING, Disp: , Rfl:  .  Multiple Vitamin (MULTIVITAMIN) capsule, Take 1 capsule by mouth daily.  , Disp: , Rfl:  .  nitroGLYCERIN (NITROSTAT) 0.4 MG SL tablet, Place 1 tablet (0.4 mg total) under the tongue every 5 (five) minutes as needed for chest pain., Disp: 25 tablet, Rfl: 3 .  Omega-3 Fatty Acids (FISH OIL) 1200 MG CAPS, Take 1 capsule by mouth daily.  , Disp: , Rfl:  .  simvastatin (ZOCOR) 20 MG tablet, Take 1 tablet (20 mg total) by mouth daily at 6 PM., Disp: 30 tablet, Rfl: 2 .  sodium chloride (OCEAN) 0.65 % SOLN nasal spray, Place 1 spray into both nostrils as needed for congestion., Disp: , Rfl: 0  Past Medical History: Past Medical History  Diagnosis Date  . History of sinusitis   . Epistaxis   . COPD (chronic obstructive pulmonary disease) ([place])   . Cigarette smoker   . Hypertension   . CAD (coronary artery disease)   . Hypercholesteremia   . Borderline diabetes mellitus   . Obesity   . History of colonic polyps 2004    hyperplastic   . DJD (degenerative joint disease)   . Anxiety   . Bladder cancer ([place]) 2011  . Emphysema of lung ([place])     Tobacco Use: History  Smoking status  . Former Smoker --  2.00 packs/day for 45 years  . Types: Cigarettes  . Quit date: 10/17/2015  Smokeless tobacco  . Never Used    Labs:     Recent Review Flowsheet Data    Labs for ITP Cardiac and Pulmonary Rehab Latest Ref Rng 03/30/2010 03/24/2011 04/18/2012 06/05/2013 10/22/2015   Cholestrol 0 - 200 mg/dL 144 162 130 158 138   LDLCALC 0 - 99 mg/dL - 98 77 96 98   LDLDIRECT - 88.7 - - - -   HDL >40 mg/dL 26.00(L) 31.90(L) 32.70(L) 32.70(L) 25(L)   Trlycerides <150 mg/dL 215.0(H) 159.0(H) 102.0 145.0 75   Hemoglobin A1c 4.6 - 6.5 % 6.7(H) 6.1 - 6.7(H) -      Capillary Blood Glucose: Lab Results  Component Value Date   GLUCAP 148* 09/14/2010   GLUCAP 114* 07/24/2010     ADL UCSD:   Pulmonary Function Assessment:     Pulmonary Function Assessment -  02/20/16 1126    Breath   Bilateral Breath Sounds Decreased   Shortness of Breath Yes;Fear of Shortness of Breath;Limiting activity;Panic with Shortness of Breath      Exercise Target Goals:    Exercise Program Goal: Individual exercise prescription set with THRR, safety & activity barriers. Participant demonstrates ability to understand and report RPE using BORG scale, to self-measure pulse accurately, and to acknowledge the importance of the exercise prescription.  Exercise Prescription Goal: Starting with aerobic activity 30 plus minutes a day, 3 days per week for initial exercise prescription. Provide home exercise prescription and guidelines that participant acknowledges understanding prior to discharge.  Activity Barriers & Risk Stratification:     Activity Barriers & Cardiac Risk Stratification - 02/20/16 1125    Activity Barriers & Cardiac Risk Stratification   Activity Barriers Deconditioning;Shortness of Breath;Back Problems      6 Minute Walk:     6 Minute Walk      02/24/16 1642       6 Minute Walk   Phase Initial     Distance 400 feet     Walk Time 3.12 minutes     # of Rest Breaks 2     MPH 0.93     METS 0.93     RPE 13     Perceived Dyspnea  3     VO2 Peak 3.23     Symptoms No     Resting HR 90 bpm     Resting BP 122/60 mmHg     Max Ex. HR 105 bpm     Max Ex. BP 150/68 mmHg     Interval HR   Baseline HR 90     2 Minute HR 90     4 Minute HR 100     6 Minute HR 105     Interval Heart Rate? Yes     Interval Oxygen   Interval Oxygen? Yes     Baseline Oxygen Saturation % 87 %     Baseline Liters of Oxygen 2 L     1 Minute Oxygen Saturation % 84 %     1 Minute Liters of Oxygen 2 L     2 Minute Oxygen Saturation % 84 %     2 Minute Liters of Oxygen 3 L     3 Minute Oxygen Saturation % 85 %     3 Minute Liters of Oxygen 3 L     4 Minute Oxygen Saturation % 82 %     4 Minute Liters of Oxygen 4 L  5 Minute Oxygen Saturation % 85 %     5  Minute Liters of Oxygen 4 L     6 Minute Oxygen Saturation % 82 %     6 Minute Liters of Oxygen 4 L     2 Minute Post Oxygen Saturation % 88 %     2 Minute Post Liters of Oxygen 4 L        Initial Exercise Prescription:     Initial Exercise Prescription - 02/26/16 0700    Date of Initial Exercise RX and Referring Provider   Date 02/26/16   Oxygen   Oxygen Continuous   Liters 4   Arm Ergometer   Level 1   Watts 13   Minutes 15   Track   Laps 3   Minutes 15   Prescription Details   Frequency (times per week) 2   Duration Progress to 45 minutes of aerobic exercise without signs/symptoms of physical distress   Intensity   THRR 40-80% of Max Heartrate 60-120   Ratings of Perceived Exertion 11-13   Perceived Dyspnea 0-4   Progression   Progression Continue progressive overload as per policy without signs/symptoms or physical distress.   Resistance Training   Training Prescription Yes   Weight blue bands   Reps 10-12      Perform Capillary Blood Glucose checks as needed.  Exercise Prescription Changes:      Exercise Prescription Changes      03/02/16 1500 03/04/16 1500 03/09/16 1500 03/11/16 1500 03/18/16 1500   Exercise Review   Progression    Yes    Response to Exercise   Blood Pressure (Admit) 120/66 mmHg 130/66 mmHg 134/70 mmHg 124/62 mmHg 120/50 mmHg   Blood Pressure (Exercise) 130/70 mmHg 140/70 mmHg 140/80 mmHg 120/70 mmHg 150/80 mmHg   Blood Pressure (Exit) 124/56 mmHg 120/64 mmHg 110/60 mmHg 112/60 mmHg 130/62 mmHg   Heart Rate (Admit) 72 bpm 72 bpm 81 bpm 78 bpm 88 bpm   Heart Rate (Exercise) 106 bpm 88 bpm 92 bpm 90 bpm 85 bpm   Heart Rate (Exit) 82 bpm 75 bpm 84 bpm 79 bpm 72 bpm   Oxygen Saturation (Admit) 91 % 88 % 94 % 85 % 93 %   Oxygen Saturation (Exercise) 80 % 85 % 85 % 83 % 86 %   Oxygen Saturation (Exit) 95 % 93 % 96 % 96 % 96 %   Rating of Perceived Exertion (Exercise) _0 Perceived Dyspnea (Exercise) _1 Duration  Progress to 45 minutes of aerobic exercise without signs/symptoms of physical distress Progress to 45 minutes of aerobic exercise without signs/symptoms of physical distress Progress to 45 minutes of aerobic exercise without signs/symptoms of physical distress Progress to 45 minutes of aerobic exercise without signs/symptoms of physical distress Progress to 45 minutes of aerobic exercise without signs/symptoms of physical distress   Intensity Other (comment)  40-80% HRR THRR unchanged THRR unchanged THRR unchanged THRR unchanged   Progression   Progression Continue to progress workloads to maintain intensity without signs/symptoms of physical distress. Continue to progress workloads to maintain intensity without signs/symptoms of physical distress. Continue to progress workloads to maintain intensity without signs/symptoms of physical distress. Continue to progress workloads to maintain intensity without signs/symptoms of physical distress. Continue to progress workloads to maintain intensity without signs/symptoms of physical distress.   Resistance Training   Training Prescription _2    Weight Blue bands blue  bands blue bands blue bands blue bands   Reps 10-12 10-12 10-12 10-12 10-12   Interval Training   Interval Training    No No   Oxygen   Oxygen _0    Liters _1 NuStep   Level _2 Minutes _3 METs 1.5  1.6 1.6    Arm Ergometer   Level _4 Minutes _5 Track   Laps _6 Minutes _7 03/23/16 1500 03/25/16 1630 03/30/16 1525 04/01/16 1600 04/08/16 1500   Exercise Review   Progression Yes  Yes  Yes   Response to Exercise   Blood Pressure (Admit) 122/70 mmHg 124/62 mmHg 120/60 mmHg 132/62 mmHg 128/62 mmHg   Blood Pressure (Exercise) 130/58 mmHg 114/60 mmHg 136/70 mmHg 126/70 mmHg 130/70 mmHg   Blood Pressure (Exit) 130/70 mmHg 122/62 mmHg 128/64 mmHg 122/70  mmHg 112/56 mmHg   Heart Rate (Admit) 71 bpm 80 bpm 75 bpm 74 bpm 88 bpm   Heart Rate (Exercise) 82 bpm 85 bpm 90 bpm 95 bpm 81 bpm   Heart Rate (Exit) 75 bpm 74 bpm 82 bpm 85 bpm 62 bpm   Oxygen Saturation (Admit) 95 % 89 % 96 % 94 % 94 %   Oxygen Saturation (Exercise) 82 % 80 %  increased to 86 with restbreak 93 % 89 % 86 %   Oxygen Saturation (Exit) 96 % 97 % 97 % 97 % 97 %   Rating of Perceived Exertion (Exercise) _8 Perceived Dyspnea (Exercise) _9 Duration Progress to 45 minutes of aerobic exercise without signs/symptoms of physical distress Progress to 45 minutes of aerobic exercise without signs/symptoms of physical distress Progress to 45 minutes of aerobic exercise without signs/symptoms of physical distress Progress to 45 minutes of aerobic exercise without signs/symptoms of physical distress Progress to 45 minutes of aerobic exercise without signs/symptoms of physical distress   Intensity _10    Progression   Progression Continue to progress workloads to maintain intensity without signs/symptoms of physical distress. Continue to progress workloads to maintain intensity without signs/symptoms of physical distress. Continue to progress workloads to maintain intensity without signs/symptoms of physical distress. Continue to progress workloads to maintain intensity without signs/symptoms of physical distress. Continue to progress workloads to maintain intensity without signs/symptoms of physical distress.   Resistance Training   Training Prescription _11    Weight _12    Reps 10-12 10-12 10-12 10-12 10-12   Interval Training   Interval Training _13    Oxygen   Oxygen _14    Liters _15 NuStep   Level _16 Minutes _17 METs 1.8 1.8 1.8     Arm  Ergometer   Level _18 Minutes _19 Track   Laps _20 Minutes _21 04/13/16 1500 04/20/16 1500 04/22/16 1600 04/27/16 1600     Exercise  Review   Progression Yes Yes      Response to Exercise   Blood Pressure (Admit) 120/66 mmHg 138/70 mmHg 142/70 mmHg 136/80 mmHg    Blood Pressure (Exercise) 146/70 mmHg 142/60 mmHg 126/60 mmHg 130/74 mmHg    Blood Pressure (Exit) 120/60 mmHg 104/60 mmHg 150/76 mmHg 122/64 mmHg    Heart Rate (Admit) 87 bpm 81 bpm 93 bpm 76 bpm    Heart Rate (Exercise) 104 bpm 92 bpm 90 bpm 90 bpm    Heart Rate (Exit) 86 bpm 82 bpm 84 bpm 77 bpm    Oxygen Saturation (Admit) 87 % 97 % 96 % 96 %    Oxygen Saturation (Exercise) 85 % 83 %  [place] with walking recovered with rest 92 % 89 %    Oxygen Saturation (Exit) 96 % 96 % 97 % 98 %    Rating of Perceived Exertion (Exercise) _0 Perceived Dyspnea (Exercise) _1 Duration Progress to 45 minutes of aerobic exercise without signs/symptoms of physical distress Progress to 45 minutes of aerobic exercise without signs/symptoms of physical distress Progress to 45 minutes of aerobic exercise without signs/symptoms of physical distress Progress to 45 minutes of aerobic exercise without signs/symptoms of physical distress    Intensity THRR unchanged THRR unchanged THRR unchanged THRR unchanged    Progression   Progression Continue to progress workloads to maintain intensity without signs/symptoms of physical distress. Continue to progress workloads to maintain intensity without signs/symptoms of physical distress. Continue to progress workloads to maintain intensity without signs/symptoms of physical distress. Continue to progress workloads to maintain intensity without signs/symptoms of physical distress.    Resistance Training   Training Prescription Yes Yes Yes Yes    Weight blue bands blue bands blue bands blue bands    Reps 10-12 10-12 10-12 10-12    Interval Training    Interval Training No No No No    Oxygen   Oxygen Continuous Continuous Continuous Continuous    Liters _2 NuStep   Level _3 Minutes _4 METs   1.9 1.9    Arm Ergometer   Level _5 Minutes _6 Track   Laps _7 Minutes _8 Exercise Comments:      Exercise Comments      03/04/16 0914 04/01/16 0914 04/29/16 0856       Exercise Comments Patient is starting off well with exercise. Will cont. to monitor progression.  Patient is cont. to increase exercise intensity on the walking track, Nustep, and Arm Ergometer. Will continue to monitor and progress as appropriate. Patient is cont. to increase exercise intensity on the walking track, Nustep, and Arm Ergometer. Will continue to monitor and progress as appropriate.        Discharge Exercise Prescription (Final Exercise Prescription Changes):     Exercise Prescription Changes - 04/27/16 1600    Response to Exercise   Blood Pressure (Admit) 136/80 mmHg   Blood Pressure (Exercise) 130/74 mmHg   Blood Pressure (Exit) 122/64 mmHg   Heart Rate (Admit) 76 bpm   Heart Rate (Exercise) 90 bpm   Heart Rate (Exit) 77 bpm   Oxygen Saturation (Admit) 96 %   Oxygen Saturation (Exercise) 89 %  Oxygen Saturation (Exit) 98 %   Rating of Perceived Exertion (Exercise) 13   Perceived Dyspnea (Exercise) 2   Duration Progress to 45 minutes of aerobic exercise without signs/symptoms of physical distress   Intensity THRR unchanged   Progression   Progression Continue to progress workloads to maintain intensity without signs/symptoms of physical distress.   Resistance Training   Training Prescription Yes   Weight blue bands   Reps 10-12   Interval Training   Interval Training No   Oxygen   Oxygen Continuous   Liters 6   NuStep   Level 6   Minutes 15   METs 1.9   Arm Ergometer   Level 3   Minutes 15   Track   Laps 7   Minutes 15       Nutrition:  Target Goals:  Understanding of nutrition guidelines, daily intake of sodium <1575m, cholesterol <2042m calories 30% from fat and 7% or less from saturated fats, daily to have 5 or more servings of fruits and vegetables.  Biometrics:     Pre Biometrics - 02/20/16 1203    Pre Biometrics   Grip Strength 35 kg       Nutrition Therapy Plan and Nutrition Goals:     Nutrition Therapy & Goals - 03/18/16 1530    Nutrition Therapy   Diet Therapeutic Lifestyle Changes   Personal Nutrition Goals   Personal Goal #1 0.5-2 lb wt loss/week to a goal wt loss of 6-24 lb [facility] [facility] [place] individualized specific dietary modifications aiming towards targeted core components such as weight, hypertension, lipid management, diabetes, heart failure and other comorbidities.;Nutrition handout(s) given to patient.  1800 kcal, 5-day menu ideas given   Expected Outcomes Short Term Goal: Understand basic principles of dietary content, such as calories, fat, sodium, cholesterol and nutrients.;Long Term Goal: Adherence to prescribed nutrition plan.      Nutrition Discharge: Rate Your Plate Scores:     Nutrition Assessments - 03/18/16 1522    Rate Your Plate Scores   Pre Score 54      Psychosocial: Target Goals: Acknowledge presence or absence of depression, maximize coping skills, provide positive support system. Participant is able to verbalize types and ability to use techniques and skills needed for reducing stress and depression.  Initial Review & Psychosocial Screening:     Initial Psych Review & Screening - 02/20/16 1126    Initial Review   Current issues with Current Depression;Current Anxiety/Panic   Family Dynamics   Good Support System? Yes   Barriers   Psychosocial barriers to participate in program The patient should benefit from training in stress management and relaxation.   Screening Interventions    Interventions Encouraged to exercise      Quality of Life Scores:   PHQ-9:     Recent Review Flowsheet Data    [facility] 04/09/2016 03/11/2016 02/20/2016 01/28/2016 01/28/2016   Decreased Interest 0 3 3 0 0   Down, Depressed, Hopeless _0 PHQ - 2 Score _1 Altered sleeping - 1 1 - -   Tired, decreased energy - 3 3 - -   Change in appetite - 0 0 - -   Feeling bad or failure about yourself  - 0 - - -   Trouble concentrating - 3 3 - -   Moving slowly or fidgety/restless - 0  0 - -   Suicidal thoughts - 0 0 - -   PHQ-9 Score - 11 11 - -   Difficult doing work/chores - Somewhat difficult Somewhat difficult - -      Psychosocial Evaluation and Intervention:     Psychosocial Evaluation - 02/20/16 1129    Psychosocial Evaluation & Interventions   Interventions Encouraged to exercise with the program and follow exercise prescription      Psychosocial Re-Evaluation:     Psychosocial Re-Evaluation      03/30/16 1709 04/27/16 1647         Psychosocial Re-Evaluation   Interventions Encouraged to attend Pulmonary Rehabilitation for the exercise Encouraged to attend Pulmonary Rehabilitation for the exercise      Comments --  no psychsocial issues identified at this time No psychosocial issues identified.      Continued Psychosocial Services Needed No No        Education: Education Goals: Education classes will be provided on a weekly basis, covering required topics. Participant will state understanding/return demonstration of topics presented.  Learning Barriers/Preferences:     Learning Barriers/Preferences - 02/20/16 1126    Learning Barriers/Preferences   Learning Barriers None   Learning Preferences Individual Instruction;Skilled Demonstration      Education Topics: Risk Factor Reduction:  -Group instruction that is supported by a PowerPoint presentation. Instructor discusses the definition of a risk factor, different risk factors for  pulmonary disease, and how the heart and lungs work together.     Nutrition for Pulmonary Patient:  -Group instruction provided by PowerPoint slides, verbal discussion, and written materials to support subject matter. The instructor gives an explanation and review of healthy diet recommendations, which includes a discussion on weight management, recommendations for fruit and vegetable consumption, as well as protein, fluid, caffeine, fiber, sodium, sugar, and alcohol. Tips for eating when patients are short of breath are discussed.   Pursed Lip Breathing:  -Group instruction that is supported by demonstration and informational handouts. Instructor discusses the benefits of pursed lip and diaphragmatic breathing and detailed demonstration on how to preform both.     Oxygen Safety:  -Group instruction provided by PowerPoint, verbal discussion, and written material to support subject matter. There is an overview of "What is Oxygen" and "Why do we need it".  Instructor also reviews how to create a safe environment for oxygen use, the importance of using oxygen as prescribed, and the risks of noncompliance. There is a brief discussion on traveling with oxygen and resources the patient may utilize.   Oxygen Equipment:  -Group instruction provided by [facility] Staff utilizing handouts, written materials, and equipment demonstrations.      PULMONARY REHAB OTHER RESPIRATORY from 04/22/2016 in [place]   Date  03/04/16   Educator  RT   Instruction Review Code  2- meets goals/outcomes      Signs and Symptoms:  -Group instruction provided by written material and verbal discussion to support subject matter. Warning signs and symptoms of infection, stroke, and heart attack are reviewed and when to call the physician/911 reinforced. Tips for preventing the spread of infection discussed.   Advanced Directives:  -Group instruction provided by verbal instruction and  written material to support subject matter. Instructor reviews Advanced Directive laws and proper instruction for filling out document.      PULMONARY REHAB OTHER RESPIRATORY from 04/22/2016 in [place]   Date  04/01/16   Educator  Jeanella Craze  Instruction Review Code  2- meets goals/outcomes      Pulmonary Video:  -Group video education that reviews the importance of medication and oxygen compliance, exercise, good nutrition, pulmonary hygiene, and pursed lip and diaphragmatic breathing for the pulmonary patient.      PULMONARY REHAB OTHER RESPIRATORY from 04/22/2016 in [place]   Date  04/22/16   Instruction Review Code  2- meets goals/outcomes      Exercise for the Pulmonary Patient:  -Group instruction that is supported by a PowerPoint presentation. Instructor discusses benefits of exercise, core components of exercise, frequency, duration, and intensity of an exercise routine, importance of utilizing pulse oximetry during exercise, safety while exercising, and options of places to exercise outside of rehab.        PULMONARY REHAB OTHER RESPIRATORY from 04/22/2016 in [place]   Date  03/18/16   Educator  EP   Instruction Review Code  2- meets goals/outcomes      Pulmonary Medications:  -Verbally interactive group education provided by instructor with focus on inhaled medications and proper administration.   Anatomy and Physiology of the Respiratory System and Intimacy:  -Group instruction provided by PowerPoint, verbal discussion, and written material to support subject matter. Instructor reviews respiratory cycle and anatomical components of the respiratory system and their functions. Instructor also reviews differences in obstructive and restrictive respiratory diseases with examples of each. Intimacy, Sex, and Sexuality differences are reviewed with a discussion on how relationships  can change when diagnosed with pulmonary disease. Common sexual concerns are reviewed.          PULMONARY REHAB OTHER RESPIRATORY from 04/22/2016 in [place]   Date  03/25/16   Educator  RN   Instruction Review Code  2- meets goals/outcomes      Knowledge Questionnaire Score:   Core Components/Risk Factors/Patient Goals at Admission:     Personal Goals and Risk Factors at Admission - 02/20/16 1130    Core Components/Risk Factors/Patient Goals on Admission    Weight Management (p) Obesity;Weight Loss;Yes   Intervention (p) Obesity: Provide education and appropriate resources to help participant work on and attain dietary goals.;Weight Management/Obesity: Establish reasonable short term and long term weight goals.   Expected Outcomes (p) Short Term: Continue to assess and modify interventions until short term weight is achieved;Long Term: Adherence to nutrition and physical activity/exercise program aimed toward attainment of established weight goal;Weight Loss: Understanding of general recommendations for a balanced deficit meal plan, which promotes 1-2 lb weight loss per week and includes a negative energy balance of [phone] kcal/d;Understanding recommendations for meals to include 15-35% energy as protein, 25-35% energy from fat, 35-60% energy from carbohydrates, less than 273m of dietary cholesterol, 20-35 gm of total fiber daily;Understanding of distribution of calorie intake throughout the day with the consumption of 4-5 meals/snacks   Sedentary (p) Yes   Intervention (p) Provide advice, education, support and counseling about physical activity/exercise needs.;Develop an individualized exercise prescription for aerobic and resistive training based on initial evaluation findings, risk stratification, comorbidities and participant's personal goals.   Expected Outcomes (p) Achievement of increased cardiorespiratory fitness and enhanced flexibility, muscular  endurance and strength shown through measurements of functional capacity and personal statement of participant.   Increase Strength and Stamina (p) Yes   Intervention (p) Provide advice, education, support and counseling about physical activity/exercise needs.;Develop an individualized exercise prescription for aerobic and resistive training based on initial evaluation findings, risk  stratification, comorbidities and participant's personal goals.   Expected Outcomes (p) Achievement of increased cardiorespiratory fitness and enhanced flexibility, muscular endurance and strength shown through measurements of functional capacity and personal statement of participant.   Improve shortness of breath with ADL's (p) Yes   Intervention (p) Provide education, individualized exercise plan and daily activity instruction to help decrease symptoms of SOB with activities of daily living.   Expected Outcomes (p) Short Term: Achieves a reduction of symptoms when performing activities of daily living.   Develop more efficient breathing techniques such as purse lipped breathing and diaphragmatic breathing; and practicing self-pacing with activity (p) Yes   Intervention (p) Provide education, demonstration and support about specific breathing techniuqes utilized for more efficient breathing. Include techniques such as pursed lipped breathing, diaphragmatic breathing and self-pacing activity.   Expected Outcomes (p) Short Term: Participant will be able to demonstrate and use breathing techniques as needed throughout daily activities.   Increase knowledge of respiratory medications and ability to use respiratory devices properly  (p) Yes   Intervention (p) Provide education and demonstration as needed of appropriate use of medications, inhalers, and oxygen therapy.   Expected Outcomes (p) Short Term: Achieves understanding of medications use. Understands that oxygen is a medication prescribed by physician. Demonstrates  appropriate use of inhaler and oxygen therapy.   Hypertension (p) Yes   Intervention (p) Provide education on lifestyle modifcations including regular physical activity/exercise, weight management, moderate sodium restriction and increased consumption of fresh fruit, vegetables, and low fat dairy, alcohol moderation, and smoking cessation.;Monitor prescription use compliance.   Expected Outcomes (p) Short Term: Continued assessment and intervention until BP is < 140/66m HG in hypertensive participants. < 130/819mHG in hypertensive participants with diabetes, heart failure or chronic kidney disease.;Long Term: Maintenance of blood pressure at goal levels.   Stress (p) Yes   Intervention (p) Offer individual and/or small group education and counseling on adjustment to heart disease, stress management and health-related lifestyle change. Teach and support self-help strategies.;Refer participants experiencing significant psychosocial distress to appropriate mental health specialists for further evaluation and treatment. When possible, include family members and significant others in education/counseling sessions.   Expected Outcomes (p) Short Term: Participant demonstrates changes in health-related behavior, relaxation and other stress management skills, ability to obtain effective social support, and compliance with psychotropic medications if prescribed.;Long Term: Emotional wellbeing is indicated by absence of clinically significant psychosocial distress or social isolation.   Personal Goal Other (p) Yes      Core Components/Risk Factors/Patient Goals Review:      Goals and Risk Factor Review      03/18/16 1522 03/30/16 1707 04/27/16 1645       Core Components/Risk Factors/Patient Goals Review   Personal Goals Review Weight Management/Obesity  Increase Strength and Stamina;Improve shortness of breath with ADL's;Weight Management/Obesity     Review see 03/18/16 RD note --  workloads have been  increased as his strength and stamina increase, purse lip breathing technique is improving no weight loss, strength and stamina are improving, shortness of breath has not drastically improved.     Expected Outcomes 0.5-2 lb wt loss/week to a goal wt loss of 6-24 lb at graduation from Pulmonary Rehab --  Continued increase in strength and stamina with a decrese in SOB weight loss and increased strength and stamina        Core Components/Risk Factors/Patient Goals at Discharge (Final Review):      Goals and Risk Factor Review - 04/27/16 1645  Core Components/Risk Factors/Patient Goals Review   Personal Goals Review Increase Strength and Stamina;Improve shortness of breath with ADL's;Weight Management/Obesity   Review no weight loss, strength and stamina are improving, shortness of breath has not drastically improved.   Expected Outcomes weight loss and increased strength and stamina      ITP Comments:   Comments: ITP REVIEW Pt is making expected progress toward personal goals after completing 14 sessions.   Recommend continued exercise, life style modification, education, and utilization of breathing techniques to increase stamina and strength and decrease shortness of breath with exertion.

## 2016-04-27 NOTE — Progress Notes (Signed)
Daily Session Note  Patient Details  Name: [name] MRN: [id number] Date of Birth: 12-23-1945 Referring Provider:    Encounter Date: 04/27/2016  Check In:     Session Check In - 04/27/16 1329    Check-In   Location [facility]   Staff Present Su Hilt, MS, ACSM RCEP, Exercise Physiologist;Joan Leonia Reeves, RN, BSN;Fredrico Beedle [employer], RN;[employer], Therapist, sports, BSN;Ramon Dredge, RN, [facility]   Supervising physician immediately available to respond to emergencies [facility] immediately available   Physician(s) Dr. Marily Memos   Medication changes reported     No   Fall or balance concerns reported    No   Warm-up and Cool-down Performed as group-led instruction   Resistance Training Performed Yes   VAD Patient? No   Pain Assessment   Currently in Pain? No/denies   Multiple Pain Sites No      Capillary Blood Glucose: No results found for this or any previous visit (from the past 24 hour(s)).      Exercise Prescription Changes - 04/27/16 1600    Response to Exercise   Blood Pressure (Admit) 136/80 mmHg   Blood Pressure (Exercise) 130/74 mmHg   Blood Pressure (Exit) 122/64 mmHg   Heart Rate (Admit) 76 bpm   Heart Rate (Exercise) 90 bpm   Heart Rate (Exit) 77 bpm   Oxygen Saturation (Admit) 96 %   Oxygen Saturation (Exercise) 89 %   Oxygen Saturation (Exit) 98 %   Rating of Perceived Exertion (Exercise) 13   Perceived Dyspnea (Exercise) 2   Duration Progress to 45 minutes of aerobic exercise without signs/symptoms of physical distress   Intensity THRR unchanged   Progression   Progression Continue to progress workloads to maintain intensity without signs/symptoms of physical distress.   Resistance Training   Training Prescription Yes   Weight blue bands   Reps 10-12   Interval Training   Interval Training No   Oxygen   Oxygen Continuous   Liters 6   NuStep   Level 6   Minutes 15   METs 1.9   Arm Ergometer   Level 3   Minutes 15   Track   Laps 7   Minutes 15     Goals Met:  Exercise tolerated well No report of cardiac concerns or symptoms Strength training completed today  Goals Unmet:  Not Applicable  Comments: Service time is from 1330 to 1500    Dr. Rush Farmer is Medical Director for Pulmonary Rehab at [facility].

## 2016-04-29 ENCOUNTER — Encounter (HOSPITAL_COMMUNITY)
Admission: RE | Admit: 2016-04-29 | Discharge: 2016-04-29 | Disposition: A | Discharge status: Home / Self Care | Payer: Medicare Other | Source: Ambulatory Visit | Attending: Pulmonary Disease | Admitting: Pulmonary Disease

## 2016-04-29 VITALS — Wt 234.8 lb

## 2016-04-29 DIAGNOSIS — J438 Other emphysema: Secondary | ICD-10-CM | POA: Diagnosis not present

## 2016-04-29 DIAGNOSIS — Z79899 Other long term (current) drug therapy: Secondary | ICD-10-CM | POA: Diagnosis not present

## 2016-04-29 DIAGNOSIS — J449 Chronic obstructive pulmonary disease, unspecified: Secondary | ICD-10-CM | POA: Diagnosis not present

## 2016-04-29 DIAGNOSIS — I2510 Atherosclerotic heart disease of native coronary artery without angina pectoris: Secondary | ICD-10-CM | POA: Diagnosis not present

## 2016-04-29 DIAGNOSIS — Z7982 Long term (current) use of aspirin: Secondary | ICD-10-CM | POA: Diagnosis not present

## 2016-04-29 DIAGNOSIS — Z87891 Personal history of nicotine dependence: Secondary | ICD-10-CM | POA: Diagnosis not present

## 2016-04-29 NOTE — Progress Notes (Signed)
Daily Session Note  Patient Details  Name: DENY CHEVEZ MRN: [id number] Date of Birth: Dec 28, 1945 Referring Provider:    Encounter Date: 04/29/2016  Check In:     Session Check In - 04/29/16 1344    Check-In   Location [facility]   Staff Present Su Hilt, MS, ACSM RCEP, Exercise Physiologist;Joan Leonia Reeves, RN, Luisa Hart, RN, BSN   Supervising physician immediately available to respond to emergencies [facility] immediately available   Physician(s) Dr. Waldron Labs   Medication changes reported     No   Fall or balance concerns reported    No   Warm-up and Cool-down Performed as group-led Location manager Performed Yes   VAD Patient? No   Pain Assessment   Currently in Pain? No/denies   Multiple Pain Sites No      Capillary Blood Glucose: No results found for this or any previous visit (from the past 24 hour(s)).      Exercise Prescription Changes - 04/29/16 1639    Response to Exercise   Blood Pressure (Admit) 132/60 mmHg   Blood Pressure (Exercise) 110/70 mmHg   Blood Pressure (Exit) 124/66 mmHg   Heart Rate (Admit) 89 bpm   Heart Rate (Exercise) 86 bpm   Heart Rate (Exit) 78 bpm   Oxygen Saturation (Admit) 93 %   Oxygen Saturation (Exercise) 91 %   Oxygen Saturation (Exit) 94 %   Rating of Perceived Exertion (Exercise) 14   Perceived Dyspnea (Exercise) 3   Duration Progress to 45 minutes of aerobic exercise without signs/symptoms of physical distress   Intensity THRR unchanged   Progression   Progression Continue to progress workloads to maintain intensity without signs/symptoms of physical distress.   Resistance Training   Training Prescription Yes   Weight blue bands   Reps 10-12   Interval Training   Interval Training No   Oxygen   Oxygen Continuous   Liters 6   NuStep   Level 6   Minutes 15   METs 2   Track   Laps 6   Minutes 15      Goals Met:  Improved SOB with ADL's Using PLB without  cueing & demonstrates good technique Exercise tolerated well No report of cardiac concerns or symptoms Strength training completed today  Goals Unmet:  Not Applicable  Comments: Service time is from 1330 to 1550   Dr. Rush Farmer is Medical Director for Pulmonary Rehab at [facility].

## 2016-05-03 NOTE — Progress Notes (Signed)
I will not be conducting a Home Exercise Prescription with patient due to patient refusing to get adequate oxygen at home. I do not suggest that the patient exercises at home since he does not have the adequate oxygen. POC will not suffice for exercise. Patient aware.

## 2016-05-04 ENCOUNTER — Encounter (HOSPITAL_COMMUNITY)
Admission: RE | Admit: 2016-05-04 | Discharge: 2016-05-04 | Disposition: A | Discharge status: Home / Self Care | Payer: Medicare Other | Source: Ambulatory Visit | Attending: Pulmonary Disease | Admitting: Pulmonary Disease

## 2016-05-04 VITALS — Wt 235.7 lb

## 2016-05-04 DIAGNOSIS — Z79899 Other long term (current) drug therapy: Secondary | ICD-10-CM | POA: Diagnosis not present

## 2016-05-04 DIAGNOSIS — J438 Other emphysema: Secondary | ICD-10-CM

## 2016-05-04 DIAGNOSIS — Z7982 Long term (current) use of aspirin: Secondary | ICD-10-CM | POA: Diagnosis not present

## 2016-05-04 DIAGNOSIS — J449 Chronic obstructive pulmonary disease, unspecified: Secondary | ICD-10-CM | POA: Diagnosis not present

## 2016-05-04 DIAGNOSIS — Z87891 Personal history of nicotine dependence: Secondary | ICD-10-CM | POA: Diagnosis not present

## 2016-05-04 DIAGNOSIS — I2510 Atherosclerotic heart disease of native coronary artery without angina pectoris: Secondary | ICD-10-CM | POA: Diagnosis not present

## 2016-05-04 NOTE — Progress Notes (Signed)
Daily Session Note  Patient Details  Name: [name] MRN: [id number] Date of Birth: 1946-04-04 Referring Provider:    Encounter Date: 05/04/2016  Check In:     Session Check In - 05/04/16 1344    Check-In   [facility]   Staff Present Su Hilt, MS, ACSM RCEP, Exercise Physiologist;[facility] Leonia Reeves, RN, BSN   Supervising physician immediately available to respond to emergencies [facility] immediately available   Physician(s) Dr. Marily Memos   Medication changes reported     No   Fall or balance concerns reported    No   Warm-up and Cool-down Performed as group-led instruction   Resistance Training Performed Yes   VAD Patient? No   Pain Assessment   Currently in Pain? No/denies   Multiple Pain Sites No      Capillary Blood Glucose: No results found for this or any previous visit (from the past 24 hour(s)).      Exercise Prescription Changes - 05/04/16 1600    Response to Exercise   Blood Pressure (Admit) 122/60 mmHg   Blood Pressure (Exercise) 140/64 mmHg   Blood Pressure (Exit) 100/50 mmHg   Heart Rate (Admit) 81 bpm   Heart Rate (Exercise) 88 bpm   Heart Rate (Exit) 78 bpm   Oxygen Saturation (Admit) 95 %   Oxygen Saturation (Exercise) 90 %   Oxygen Saturation (Exit) 97 %   Rating of Perceived Exertion (Exercise) 13   Perceived Dyspnea (Exercise) 2   Duration Progress to 45 minutes of aerobic exercise without signs/symptoms of physical distress   Intensity THRR unchanged   Progression   Progression Continue to progress workloads to maintain intensity without signs/symptoms of physical distress.   Resistance Training   Training Prescription Yes   Weight blue bands   Reps 10-12   Interval Training   Interval Training No   Oxygen   Oxygen Continuous   Liters 6   NuStep   Level 6   Minutes 15   METs 1.6   Arm Ergometer   Level 3   Minutes 15   Track   Laps 8   Minutes 15     Goals Met:  Improved SOB with  ADL's Using PLB without cueing & demonstrates good technique Exercise tolerated well Strength training completed today  Goals Unmet:  Not Applicable  Comments: Service time is from 1330 to 1505    Dr. Rush Farmer is Medical Director for Pulmonary Rehab at [facility].

## 2016-05-05 ENCOUNTER — Ambulatory Visit (INDEPENDENT_AMBULATORY_CARE_PROVIDER_SITE_OTHER): Payer: Medicare Other | Admitting: Pulmonary Disease

## 2016-05-05 ENCOUNTER — Encounter: Payer: Self-pay | Admitting: Pulmonary Disease

## 2016-05-05 VITALS — BP 136/66 | HR 70 | Ht 66.0 in | Wt 235.0 lb

## 2016-05-05 DIAGNOSIS — I420 Dilated cardiomyopathy: Secondary | ICD-10-CM

## 2016-05-05 DIAGNOSIS — J449 Chronic obstructive pulmonary disease, unspecified: Secondary | ICD-10-CM

## 2016-05-05 DIAGNOSIS — I10 Essential (primary) hypertension: Secondary | ICD-10-CM

## 2016-05-05 DIAGNOSIS — I272 Other secondary pulmonary hypertension: Secondary | ICD-10-CM

## 2016-05-05 DIAGNOSIS — J9611 Chronic respiratory failure with hypoxia: Secondary | ICD-10-CM | POA: Diagnosis not present

## 2016-05-05 DIAGNOSIS — E669 Obesity, unspecified: Secondary | ICD-10-CM

## 2016-05-05 DIAGNOSIS — Z87891 Personal history of nicotine dependence: Secondary | ICD-10-CM

## 2016-05-05 DIAGNOSIS — I2510 Atherosclerotic heart disease of native coronary artery without angina pectoris: Secondary | ICD-10-CM

## 2016-05-05 MED ORDER — ALBUTEROL SULFATE 108 (90 BASE) MCG/ACT IN AEPB: 2.0000 | INHALATION_SPRAY | Freq: Four times a day (QID) | RESPIRATORY_TRACT | Status: DC | PRN

## 2016-05-05 NOTE — Patient Instructions (Signed)
Today we updated your med list in our EPIC system...    Continue your current medications the same...  We wrote a new prescription for your RESCUE inhaler= PROAIR RESPICLICK    1-2 sprays every 4-6H as needed for wheezing...  Keep up the good work w/ DIET & EXERCISE!!!  Call for any questions...  Let's plan a follow up visit in 3-15mo sooner if needed for problems..Marland KitchenMarland Kitchen

## 2016-05-05 NOTE — Progress Notes (Signed)
Subjective:    Patient ID: [name], male    DOB: 1946-07-19, 70 y.o.   MRN: [id number]  HPI 70 y/o WM here for a follow up visit and on-going management of mult med problems...  ~  SEE PREV EPIC NOTES FOR OLDER DATA >>    CXR 7/14 showed COPD/ emphysema, incr markings and scarring at the bases, NAD...  LABS 7/14:  FLP- ok on Simva20;  Chems- ok x BS=120 A1c=6.7;  CBC- wnl;  TSH=1.26;  PSA=0.41...  [name] has established Primary Care follow up w/ [name] at [facility]>>   CXR 7/15 showed underlying COPD, mild bibasilar fibrosis, norm heart size, DJD in spine, NAD...   PFT 7/15 showed> FVC=3.18 (78%), FEV1=1.22 (38%), %1sec=38; mid-flows=13% predicted... This is c/w GOLD Stage3 COPD and we discussed this...  ~  December 05, 2014:  43moROV & pulmonary recheck; [name] continued smoking, perhaps decr slightly to 1/2ppd but unable to quit & he has gained 6# in the interim up to 221# today;  His CC is sinusitis w/ nasal congestion, drainage & green mucus plus maxillary/ frontal HAs; he denies f/c/s, notes stable cough & chest symptoms w/ DOE;  He has been taking his Advair250Bid but prev refused addition of an anticholinergic due to "the donut hole";  States his breathing is "fine, except in cold air" ( we discussed wearing a scarf in winter etc)... He has GOLD Stage3 COPD w/ FEV1=1.22 (38%predicted) done 7/15;;  Exam shows decr BS bilat w/ few scat rhonchi & mild exp wheezing esp w/ forced maneuver...     We reviewed prob list, meds, xrays and labs> see below for updates >> [facility]... PLAN>> we discussed treating his acute max sinusitis w/ Augmentin875Bid, plus align daily & nasal saline as needed; he knows the importance of smoking cessation but is not motivated & declines smoking cessation help; he will continue the Advair250Bid 7 we are adding spiriva via handihaler once per day; we plan ROV in 627mo/ f/u CXR & PFT,  call in the interim for any breathing issues....   ~  June 05, 2015:  75m21moV & [name] a good interval, no new complaints or concerns; still smoking +/-1ppd and blames family stress; notes cough, sm amy yellow sput, no hemoptysis, SOB w/o change/stable- still mows 3 yards, does chores, tends to 20+chickens & their eggs, etc; he has repeatedly declined smoking cessation help, clearly not motivated to quit, not even doing his inhalers regularly!!!    COPD, +smoker> on Advair250Bid & Spiriva daily; symptoms as above, he is stoic, still smoking 1ppd & NOT motivated to quit, asked to cut back & use both inhalers regularly...    HBP> on Metop25Bid, Amlod10, Lisin40; BP=114/64 & he denies CP, palpit, SOB, edema...    CAD> on ASA81; followed by [name] & seen 4/15 (overdue for f/u)> CAD, IWMI 1995, mult PCIs, last Myoview 2013 was low risk; too sedentary, no changes made; 73 48o brother died w/ AAA rupture...    Chol> on Simva20; FLP 7/14 showed TChol 158, TG 145, HDL 33, LDL 96; needs better diet & wt reduction and ret for Fasting blood work overdue.    DM> on diet alone; LABS 7/14 showed BS= 120, A1c= 6.7; we reviewed low carb diet, exercise program, and wt reduction...    Obese> wt recorded as 204# today but it doesn't look like he's lost 26#; we reviewed diet &  exercise...    GI- Gastritis, polyps> prev on Zantac; prev colon 2004 w/ hyperplastic polyp removed & f/u 8/14 by DrStark w/ 3 polyps removed (5-60m size, all were tubular adenomas), +divertics in sigmoid, f/u planned 5 yrs... NOTE> exam showed a small palp knot in mid-abd above the umbilicus ?etiology (I offered CT Abd but he declines & wants to discuss w/ his primary- [name]).    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    DJD> on OTC analgesics  as needed...    Anxiety> on Alpraz0.18m57mrn... We reviewed prob list, meds, xrays and labs> see below for updates >>  NOTE> HE IS FOLLOWED BY [name] for GEN MED/ PCP now...  CXR 06/05/15 showed stable heart size, Ao atherosclerosis & tortuousiy, COPD/emphysema, incr markings at the bases, arthritis in Tspine...  LABS 7/16: pt asked to ret for fasting blood work... IMP/PLAN>>  [name] is stable but continues to smoke 1ppd; CXR w/ COPD/emphysema, atherosclerotic Ao, chronic changes but NAD- we discussed the low-dose screening CT Chest for the early detection of lung cancer (he is a candidate & he will consider participating in this program); in the meanwhile- continue Advair/ Spiriva regularly;  He is overdue for Cards f/u w/ [name], and needs to continue Q6mo29mo w/ [name] (w/ copy of notes to us);KoreaHe will ret for FASTING blood work.  ADDENDUM>> given his age and smoking history, [name] candidate for LUNG CANCER SCREENING w/ LOW-DOSE CT Chest>>  This office visit constitutes a face-to-face visit for the purpose of lung cancer screening counseling and a shared decision making visit...  Eligibility for LDCT screening:  1) Age=70 (must be 55-77y/o);  2) Absence of symptoms of lung cancer: he denies CP, ch in SOB, hemoptysis.  Smoking History:  1) He has 60+ pk-yrs (must have >30 pk-yr hx smoking);  2) Smoking status- still smoking ~1ppd (current or former smoker who quit<15 yrs ago).  Counseling:  Importance of smoking cessation &/or continued abstinence discussed ; offered smoking cessation interventions- pt declined; pt agreed to continued LDCT screening annually.  Shared decision making:  We reviewed the potential benefits and harms of screening> eg. Early detection of lung cancer and improved survival, the need for additional diagnostic tests & possible surgery, the risk of over-diagnosis/ false positives/ and radiation exposure... All questions were answered to the best  of my ability & he would like to consider the LDCT screening & he will let us kKoreaw his decision=> he ignored this discussion & never called.  ~  October 31, 2015:  5 month ROV & post hospital follow up visit>  [name] was Adm 12/5 - 10/29/15 by [facility] w/ acute hypoxemic resp failure precipitated by a COPD exac; for his part he noted incr SOB, cough, green sput, & "slowing down", he denied f/c/s, no CP, etc;  In the ER he was hypoxemic w/ O2sats in the 70s on RA and eval revealed> CXR- COPD, bullous dis, incr markings at bases, NAD; Cultures of blood/ sput/ urine were all neg; Serologies for strep & legionella were neg; Viral panel was neg;  LABs were OK (WBC=7.9=>14, Hg=15.3=>13.9, BS=100-140, BNP=498);  EKG showed NSR, rate78, NSSTTWA;  2DEcho 10/21/15 showed mildLVH, norm LVF w/ EF=55-60%, no regional wall motion abn, LA-mod  dil, RA- mod to severe dil, RV- mod dil, mod TR, PAsys-70mHg... He was treated w/ Oxygen, IV Solumedrol, Rocephin/ Zithromax, NEBS, Lovenox, flutter, etc; he was disch on Home O2- 2L/min activ & 1L/min rest, Pred taper, Levaquin, Advair250/ Spiriva...  He returns today feeling better, notes his home pulse-ox checks are improving & he has less SOB/DOE, less cough/sput, etc;  He says he has not smoked since PTA...      EXAM shows Afeb, VSS, O2sat=94% on 2L/min [place];  Wt=210# (he was 204 prev);  HEENT- neg, mallampati1;  Chest- decr BS bilat, few scat rhonchi, no w/r/consolidation;  Heart- RR w/o m/r/g;  Abd- obese, soft;  Ext- neg w/o c/c/e...  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    ~  November 21, 2015:  3wk ROV & MrWicker reports that he feels much better, SOB improved, cough improved, still prod of a sm amt green sput w/o blood w/o f/c/s;  He's been using the NEB w/ Albut TID followed by his AdvairBid & SpirivaQd; he is off the Pred now, he is too sedentary & not exercising => he needs PULM REHAB & we will refer, [facility] we will inquire as to what he might be elig for...       EXAM shows Afeb, VSS, O2sat=98% on 2L/min [place];  Wt=216# (he was 204-210# prev);  HEENT- neg, mallampati2;  Chest- decr BS bilat, otherw clear w/o w/r/r;  Heart- RR w/o m/r/g;  Abd- obese, soft;  Ext- neg w/o c/c/e...  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 We plan ROV in 6 wks.  ~  February 03, 2016:  2-376moOV & ErSharman Crateeturns w/ his severe obstructive lung dis, GOLD Stage 3, chr hypoxemic resp failure w/ pulmHTN (WHO group3); he quit smoking 10/2015 & is using home O2 3L/min, NEBS w/ Albut tid, Advair250Bid, Spiriva daily, Mucinex etc... He reports that breathing is stable overall but he is c/o 51m86mo URI w/ cough, thick yellow sput hard to produce, incr SOB 7 chest tightness but no fever, no CP=> he wants another antibiotic... He spent much time ventilating about his problems w/ DME[facility], his O2 set-up, & [facility] program- they still haven't contacted him about starting Rehab... He does have mult entries into EPIC from [employer] reviewed...     EXAM shows Afeb, VSS, O2sat=92% on 3L/min [place];  Wt=231# (up 15# & he was 204-210# prev);  HEENT- neg, mallampati2;  Chest- decr BS bilat, otherw clear w/o w/r/r;  Heart- RR w/o m/r/g;  Abd- obese, soft;  Ext- neg w/o c/c/e.  PFT 02/03/16>  FVC=2.57 (68%), FEV1=1.05 (38%), %1sec=41, mid-flows reduced at 18% predicted; post bronchodil  FEV1 w/o change;  TLC=6.67 (107%), RV=3.97 (176%), RV/TLC=60;  DLCO=22% and DL/VA=29%... This is c/w a severe obstructive ventilatory defect w/ air trapping and severely decr DLCO c/w emphysema- GOLD Stage  3 COPD... IMP/PLAN>>  We discussed continuing the NEBS Tid followed by Advair250Bid & Spiriva daily; add Mucinex '1200mg'$ Bid & we will Rx w/ Levaquin500/d x7d for his bronchitic infection (see AVS);  He still needs a CT Chest but wants to wait for now;  Needs to get into the [facility] program ASAP, and he wants diff DME supplier- we will inquire, plan ROV 24mo.. ADDENDUM 03/18/16>> Pt unable to afford inhalers- ADVAIR & SPIRIVA discontinued and placed on NEBULIZER w/ DUONEB QID & PULMICORT 0.25 BID regularly (we will update med list to reflect this change).  ~  May 05, 2016:  339moOV & [name] notes DOE w/ exertion- he is in [facility] [employer] using up to 6L/min w/ exercise (he tried [employer] using 3-4L/min at rest & Qhs; he feels that he is making some progress & I reminded him the DIET + EXERCISE and wt loss would be very beneficial to his breathing; states he is walking & mowing on his off days (ie- when not in rehab); despite everything his wt continues to climb- now up 4# to 235# "exercise makes me hungry"... we reviewed the following medical problems during today's office visit >> his new PCP is [name]...    COPD, ex-smoker, chr hypoxemic RF> on NEB w/ Duoneb Qid + Budes-Bid + HFA prn (Advair & Spiriva too $$); he finally quit smoking, symptoms as above, he is now in pulm rehab but having issues w/ low sats...    HBP> on MetopER-'25mg'$ (2AM&1PM), Amlod10, Lisin10, Lasix40Bid; BP=136/66 & he denies CP, palpit, SOB, edema...    CAD> on ASA81, Imdur30; followed by [name] & seen 04/2016> CAD, IWMI 1995, mult PCIs, last Myoview 2013 was low risk; too sedentary, edema decr w/ Lasix; 7350/o brother died w/ AAA rupture...    Chol> on Simva20; FLP 12/16 showed TChol 138, TG 75, HDL 25, LDL 98; needs better diet & wt  reduction and better compliance.    DM> on diet alone; LABS 5/17 showed BS= 118, A1c= per PCP; we reviewed low carb diet, exercise program, and wt reduction...    Obese> wt = 235#, 5'6"Tall, BMI=38;  we reviewed diet & exercise...    GI- Gastritis, polyps> prev on Zantac; prev colon 2004 w/ hyperplastic polyp removed & f/u 8/14 by DrStark w/ 3 polyps removed (5-53m51mize, all were tubular adenomas), +divertics in sigmoid, f/u planned 5 yrs... NOTE> exam showed a small palp knot in mid-abd above the umbilicus ?etiology (I offered CT Abd but he declines & wants to discuss w/ his primary- [name]).    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     DJD> on OTC analgesics as needed...    Anxiety> on Alpraz0.'5mg'$  prn... EXAM shows Afeb, VSS, O2sat=92% on 3L/min [place];  Wt=231# (up 15# & he was 204-210# prev);  HEENT- neg, mallampati3;  Chest- decr BS bilat, otherw clear w/o w/r/r;  Heart- RR w/o m/r/g;  Abd- obese, soft;  Ext- neg w/o c/c/e. IMP/PLAN>>  [name] is rec to continue current meds regularly, get on diet & get wt down , continue in PulmRehab, etc; we wrote for a ProairHFA rescue inhaler for prn use...           Problem List:  Hx of SINUSITIS (ICD-473.9) - off Nasonex now & recent Rx w/ Augmentin/  Saline... notes occas sinus congestion, drainage, sl HA, etc... he knows that he needs to quit smoking! ~  10/10: Adm for epistaxis w/ eval by DrWolicki- CXR= NAD, labs OK, rec for saline lavage- nose bleed resolved & disch home (no recurrence so far). ~  1/16: he presented w/ acute max & frontal sinusitis> Rx w/ Augmentin, Align, Saline, Mucinex, etc... ~  3/17: he has acute bronchitic exac- treated w/ Levaquin x7d + his regular meds...  COPD (ICD-496) >>   ~  on ADVAIR 250Bid; STILL SMOKES CIGARETTES & he's refused  Chantix...  ~  He had hemoptysis 6/99 without lesion on CXR, CTChest, or Bronchoscopy... ~  Adm 10/10 w/ epistaxsis & eval DrWolicki as noted> he wanted to do Sleep Study but pt cancelled it & declined to resched. 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 ~  CXR 5/12 showed ectatic calcif & tort Aorta, COPD w/ incr markings ar bases, NAD, +degen spondylosis. ~  CXR 6/13 showed COPD/ emphysema, scarring in the lower lobes, otherw clear lungs, DJD in spine... ~  CXR 7/14 showed COPD/ emphysema, incr markings and scarring at the bases, NAD. ~  1/15: [name] says he's decr his smoking from prev 3-4ppd down to 3 cig/d;  He remains on Advair250> states breathing is ok, at baseline CAT score=12; baseline cough, sput, no hemoptysis, stable DOE, no edema... ~  CXR 7/15 showed underlying COPD, mild bibasilar fibrosis, norm heart size, DJD in spine, NAD...  ~  PFT 7/15 showed> FVC=3.18 (78%), FEV1=1.22 (38%), %1sec=38; mid-flows=13% predicted... This is c/w GOLD Stage3 COPD and we discussed this> He is rec to quit all smoking immediately; he has been using the nicotine gum & rec to try patches, offered Wellbutrin, he refuses Chantix; needs to continue ADVAIR250Bid & samples givem; he declines to start [place] at this time due to cost & donut hole (we will address this again on return); reminded to use MUCINEX '600mg'$ - 1to2 Bid w/ fluids, and OK to use OTC DELSYM as needed for cough...   ~  1/16: he claims stable on Advair250 but still smoking & can't vs won't quit; we decided to add Spiriva daily via handihaler... 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 ~  12/16:  [facility] w/ hypoxemic resp failure, COPD exac, and found to have  pulmHTN w/ 2DEcho showing PAsys=51;  Treated w/ O2, Pred taper, antibiotics, NEBS, Advair/Spiriva, etc;  He says he has quit smoking... ~  [facility] 10/2015> CXR- COPD, bullous dis, incr markings at bases, NAD; Cultures of blood/ sput/ urine were all neg; Serologies for strep & legionella were neg; Viral panel was neg; He was treated w/ Oxygen, IV Solumedrol, Rocephin/ Zithromax, NEBS, Lovenox, flutter, etc; He was disch on Home O2- 2L/min activ & 1L/min rest, Pred taper, NEBS, Levaquin, Advair250/ Spiriva => improved. 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   HE HAS ESTABLISHED PRIMARY  CARE w/ [facility]>>   HYPERTENSION (ICD-401.9) - controlled on METOPROLOL '25mg'$ Tid,  NORVASC '10mg'$ /d, LISINOPRIL '40mg'$ /d...   ~  11/11:  BP=122/70, not checking BP's at home... denies HA, fatigue, visual changes, CP, palipit, dizziness, syncope, dyspnea, edema, etc. ~  5/12:  BP= 120/72, pt very pleased w/ his wt loss & improved exercise ability. ~  6/13:  BP= 122/64 & feeling well w/o CP, palpit, SOB, edema, etc... ~  12/13:  BP=122/82 & he denies CP, palpit, SOB, edema... ~  7/14: on Metop25-3/d, Amlod10, Lisin40; BP=130/78 & he denies CP, palpit, SOB, edema ~  1/15: BP remains stable on Metop25-2AM & 1PM, Amlod5, Lisin40; BP= 120/72 and he denies CP, palpit, ch in SOB, edema. ~  2016> His meds have been adjusted- now on Rx w/ Amlod10, Lisin10; BP= 130/72 7 he denies CP, palpit, edema; chr SOB/DOE due to his COPD.  CORONARY ARTERY DISEASE (ICD-414.00) - ASA '81mg'$ /d & off Plavix as of 2/13 officially as he wasn't taking it regularly anyway. ~  Hx prev IWMI and PTCA/ stent in RCA by DrBrodie 12/95 & 5/96...  ~  Myoview 4/08  abnormal w/ inferior ischemia and subseq cath revealing restenoses in RCA- s/p repeat PTCA/ stents...  ~  f/u Myoview 9/08 w/ prev infer infarct, no ischemia, EF=56%... ~  9/11:  pre op cardiac eval DrBrodie> OK to hold ASA/ Plavix for bladder surg. ~  2/12: f/u [name] w/ hx CAD, MI in 95, mult PCIs last 2009> stable, on ASA/ Plavix, no changes made; EKG= NSR, WNL... ~  2/13:  He had f/u [name] w/ Myoview showing a fixed defect in infer wall, no ischemia, EF=58%; pt wasn't taking Plavix, therefore stopped in favor of ASA'81mg'$ /d... ~  3/14:  He had f/u [name]> CAD, IWMI 1995, mult PCIs, last Myoview 2013 was low risk; too sedentary, no changes made; 44 y/o brother died recently w/ AAA rupture... ~  EKG 3/14 showed NSR, rate65, WNL, NAD... ~  He maintains f/u w/ CARDS, [name]- known CAD, Hx inferior MI 1995, s/p mult PCIs- most recent 2009; finally qit smoking 12/16 after [facility] for AECOPD w/ hypoxemia; on ASA, statin, Amlod & Lisin.  HYPERCHOLESTEROLEMIA (Mixed Hyperlipidemia) - prev on Simva80 but DrBrodie decr him to SIMVASTATIN '20mg'$ /d + Fish Oil & Red Wine qd... ~  [place] 9/08 on Simva40 showed TChol 151, TG 106, HDL 23, LDL 107 ~  FLP 8/09 on Simva80 showed TChol 142, TG 135, HDL 28, LDL 87 ~  FLP 6/10 on Simva80 showed TChol 151, TG 117, HDL 31, LDL 97 ~  FLP in hosp 10/10 showed TChol 128, TG 110, HDL 26, LDL 80 ~  FLP 5/11 on Simva80 showed TChol 144, TG 215, HDL 26, LDL 89 ~  9/11:  DrBrodie decr him to Simva20... we discussed the need for better diet & wt reduction. ~  FLP 5/12 on Simva20 showed TChol 162, TG 159, HDL 32, LDL 98 ~  FLP 6/13 on Simva20 showed TChol 130, TG 102, HDL 33, LDL 77... rec to incr exercise program & get wt down! ~  FLP 7/14 on Simva20 showed TChol 158, TG 145, HDL 33, LDL 96... He has gained way too much wt 7 must get on diet! ~  Followed by PCP- [name]...  DIABETES MELLITUS, BORDERLINE (ICD-790.29) - on diet alone... ~  labs 9/08 showed BS=100,  HgA1c=6.1 ~  labs 8/09 showed BS= 107, A1c= 6.2 ~  labs 6/10 showed BS= 108, A1c= 6.2 ~  lab 5/11 (  wt=244#) showed BS= 121. A1c= 6.7.[name]. he was told "get wt down, or start meds". ~  Labs 5/12 showed BS= 109, A1c= 6.1 ~  Labs 6/13 showed BS= 114, & he needs to lose the weight... ~  Labs 7/14 on diet alone showed BS= 120, A1c= 6.7 ~  Followed by PCP- [name]...  OBESITY (ICD-278.00) - weight was down as low as 202# in 2/09, and steadily incr to 244# by 5/11... ~  weight 5/11 = 244#.[name].  we reviewed diet + exercise necessary to lose weight... ~  weight 11/11 = 217# (after dx & rx for bladder cancer) ~  Weight 5/12 = 214# but says it's 192# at home... ~  Weight 6/13 = 211# but knows it's better since his pant/ belt is down to 36" (under his roll). ~  Weight 7/14 = 230# but he has quit smoking he says, now must lose the weight... ~  Weight 1/15 = 229# ~  Weight 1/17 = 216# ~  Weight 3/17 = 231# and we reviewed diet, exercise, wt reducing strategies...  GASTRITIS (ICD-535.50) - had EGD 10/05 showing gastritis... on RANITADINE '300mg'$ /d due to his Plavix Rx, off prev Omep.  COLONIC POLYPS (ICD-211.3) - last colonoscopy by DrStark 3/04 showed 35m polyp = hyperplastic w/ f/u planned 138yr. 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 ~  4/12:  Pt reported f/u cysto was neg, doing well... ~  10/12:  Note from [name] reviewed> cysto was neg, no recurrent tumors... ~  4/13:  Note from [name] reviewed> neg f/u cysto w/o recurrent TCCa... He wants to continue Q6m46mostoscopies... ~  1/14:  He had f/u w/ [name]> Hx TCCa bladder, s/p TURBT 9/11, given BCG 11/11-12/11 and f/u Cysto 1/12 was neg; repeat cysto 1/14 was neg as well...  DEGENERATIVE  JOINT DISEASE (ICD-715.90) - He has c/o right shoulder pain, hip pain, knee pain, and LBP> Rx ADVIL w/ some benefit... offered Ortho referral & he will decide.  ANXIETY (ICD-300.00) - on ALPRAZOLAM 0.'5mg'$  Prn...   Past Surgical History  Procedure Laterality Date  . Cataract surgery/sub posterior vitrectomy in left eye  1996    Dr. RanZadie [name] Cataract surgery  septemeber 2013  . Ptca  [phone] Outpatient Encounter Prescriptions as of 02/03/2016  Medication Sig  . ADVAIR DISKUS 250-50 MCG/DOSE AEPB INHALE 1 PUFF BY MOUTH EVERY 12 HOURS  . albuterol (PROVENTIL HFA;VENTOLIN HFA) 108 (90 BASE) MCG/ACT inhaler Inhale 2 puffs into the lungs 4 (four) times daily as needed for wheezing (for use when not at home / unable to use nebulizer).  . aMarland Kitchenbuterol (PROVENTIL) (2.5 MG/3ML) 0.083% nebulizer solution Take 3 mLs (2.5 mg total) by nebulization 3 (three) times daily.  . AMarland KitchenPRAZolam (XANAX) 0.5 MG tablet Take 0.5 mg by mouth 3 (three) times daily as needed for anxiety or sleep.  . aMarland KitchenLODipine (NORVASC) 10 MG tablet TAKE 1 TABLET (10 MG TOTAL) BY MOUTH DAILY.  . aMarland Kitchenpirin 81 MG tablet Take 81 mg by mouth at bedtime.   . Flaxseed, Linseed, (FLAXSEED OIL) 1000 MG CAPS Take 1 capsule by mouth daily.    . iMarland Kitchenuprofen (ADVIL,MOTRIN) 600 MG tablet Take 600 mg by mouth every 6 (six) hours as needed for  moderate pain.  [name] lisinopril (PRINIVIL,ZESTRIL) 10 MG tablet Take 1 tablet (10 mg total) by mouth daily.  [name] METOPROLOL SUCCINATE ER PO Take by mouth. Pt unsure of MG, Pt states he is taking 2 tabs in morning and 1 tab in evening.  . Multiple Vitamin (MULTIVITAMIN) capsule Take 1 capsule by mouth daily.    . nitroGLYCERIN (NITROSTAT) 0.4 MG SL tablet Place 1 tablet (0.4 mg total) under the tongue every 5 (five) minutes as needed for chest pain.  . Omega-3 Fatty Acids (FISH OIL) 1200 MG CAPS Take 1 capsule by mouth daily.    . simvastatin (ZOCOR) 20 MG tablet Take 1 tablet (20 mg total) by mouth daily at 6 PM.   . sodium chloride (OCEAN) 0.65 % SOLN nasal spray Place 1 spray into both nostrils as needed for congestion.  [name] SPIRIVA HANDIHALER 18 MCG inhalation capsule INHALE CONTENTS OF 1 CAPSULE  DAILY    No Known Allergies  Current Medications, Allergies, Past Medical History, Past Surgical History, Family History, and Social History were reviewed in [employer] record.    Review of Systems        See HPI - all other systems neg except as noted... The patient complains of some dyspnea on exertion.  The patient denies anorexia, fever, weight loss, weight gain, vision loss, decreased hearing, hoarseness, chest pain, syncope, peripheral edema, prolonged cough, headaches, hemoptysis, abdominal pain, melena, hematochezia, severe indigestion/heartburn, hematuria, incontinence, muscle weakness, suspicious skin lesions, transient blindness, difficulty walking, depression, unusual weight change, abnormal bleeding, enlarged lymph nodes, and angioedema.     Objective:   Physical Exam    WD, Obese, 70 y/o WM in NAD... Vital Signs:  Reviewed... GENERAL:  Alert & oriented; pleasant & cooperative... HEENT:  [place]/AT, EOM-wnl, PERRLA, EACs-clear, TMs-wnl, NOSE- sl congestion, THROAT-clear & wnl. NECK:  Supple w/ fairROM; no JVD; normal carotid impulses w/o bruits; no thyromegaly or nodules palpated; no lymphadenopathy. CHEST:  decr BS at bases bilat w/ few scat rhonchi, no wheezing/ rales/ or signs of consolidation... HEART:  Regular Rhythm; without murmurs/ rubs/ or gallops detected... ABDOMEN:  Obese, soft, & nontender; sm knot palpated in mid abd above umbilicus; normal bowel sounds; no organomegaly detected. EXT: without deformities, mild arthritic changes; no varicose veins/ +venous insuffic/ no edema. NEURO:  CN's intact; no focal neuro deficits... DERM:  No lesions noted; no rash etc...  RADIOLOGY DATA:  Reviewed in the EPIC EMR & discussed w/ the patient...  LABORATORY DATA:   Reviewed in the EPIC EMR & discussed w/ the patient...   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  11/21/15>  referred for PulmRehab, contact [place] re- POC, he still needs Full PFTs & CT Chest... We plan ROV in 6 wks 02/02/16> We discussed continuing the NEBS Tid followed by Advair250Bid & Spiriva daily; add Mucinex '1200mg'$ Bid & we will Rx w/ Levaquin500/d x7d for his bronchitic infection (see AVS);  He still needs a CT Chest but wants to wait for now;  Needs to get into the [facility] program ASAP, and he wants diff DME supplier- we  will inquire, plan ROV 22mo6/21/17>   The Advair & Spiriva were too $$ so we switched to NEBS w/ Duoneb Qid & Budes Bid; added ProairHFA prn; he desperately needs to lose wt in conjunction w/ his PulmRehab etc...   Primary Care followed by [name]>> HBP>   CAD>            Followed by [name] & DWilburt FinlayLIPIDS>   DM>   GI> Hx Gastritis, Polyps>   GU>   Followed by [name]   Patient's Medications  New Prescriptions   ALBUTEROL SULFATE (PROAIR RESPICLICK) 1612(90 BASE) MCG/ACT AEPB    Inhale 2 puffs into the lungs every 6 (six) hours as needed.  Previous Medications   ALBUTEROL (PROVENTIL HFA;VENTOLIN HFA) 108 (90 BASE) MCG/ACT INHALER    Inhale 2 puffs into the lungs 4 (four) times daily as needed for wheezing (for use when not at home / unable to use nebulizer).   ALBUTEROL (PROVENTIL) (2.5 MG/3ML) 0.083% NEBULIZER SOLUTION    Take 3 mLs (2.5 mg total) by nebulization 3 (three) times daily.   ALPRAZOLAM (XANAX) 0.5 MG TABLET    Take 0.5 mg by mouth 3 (three) times daily as needed  for anxiety or sleep.   ASPIRIN 81 MG TABLET    Take 81 mg by mouth at bedtime.    BUDESONIDE (PULMICORT) 0.25 MG/2ML NEBULIZER SOLUTION    Take 2 mLs (0.25 mg total) by nebulization 2 (two) times daily.   FLAXSEED, LINSEED, (FLAXSEED OIL) 1000 MG CAPS    Take 1 capsule by mouth daily.     FUROSEMIDE (LASIX) 40 MG TABLET    Take 40 mg by mouth 2 (two) times daily.   IBUPROFEN (ADVIL,MOTRIN) 600 MG TABLET    Take 600 mg by mouth every 6 (six) hours as needed for moderate pain.   IPRATROPIUM-ALBUTEROL (DUONEB) 0.5-2.5 (3) MG/3ML SOLN    Take 3 mLs by nebulization 4 (four) times daily.   ISOSORBIDE MONONITRATE (IMDUR) 30 MG 24 HR TABLET    Take 1 tablet (30 mg total) by mouth daily.   LISINOPRIL (PRINIVIL,ZESTRIL) 10 MG TABLET    Take 1 tablet (10 mg total) by mouth daily.   METOPROLOL SUCCINATE (TOPROL-XL) 25 MG 24 HR TABLET    TAKE 2 TABLETS BY MOUTH IN THE MORNING AND 1 TABLET BY MOUTH IN THE THE EVENING   MULTIPLE VITAMIN (MULTIVITAMIN) CAPSULE    Take 1 capsule by mouth daily.     NITROGLYCERIN (NITROSTAT) 0.4 MG SL TABLET    Place 1 tablet (0.4 mg total) under the tongue every 5 (five) minutes as needed for chest pain.   OMEGA-3 FATTY ACIDS (FISH OIL) 1200 MG CAPS    Take 1 capsule by mouth daily.     SIMVASTATIN (ZOCOR) 20 MG TABLET    Take 1 tablet (20 mg total) by mouth daily at 6 PM.   SODIUM CHLORIDE (OCEAN) 0.65 % SOLN NASAL SPRAY    Place 1 spray into both nostrils as needed for congestion.  Modified Medications   No medications on file  Discontinued Medications   No medications on file  ADDENDUM>> Advair & Spiriva discontinued due to cost & replaced by DUONEB-Qid & PULMICORT 0,'25mg'$ Bid via NEBULIZER..Marland KitchenMarland Kitchen

## 2016-05-06 ENCOUNTER — Encounter (HOSPITAL_COMMUNITY)
Admission: RE | Admit: 2016-05-06 | Discharge: 2016-05-06 | Disposition: A | Discharge status: Home / Self Care | Payer: Medicare Other | Source: Ambulatory Visit | Attending: Pulmonary Disease | Admitting: Pulmonary Disease

## 2016-05-06 VITALS — Wt 233.5 lb

## 2016-05-06 DIAGNOSIS — Z79899 Other long term (current) drug therapy: Secondary | ICD-10-CM | POA: Diagnosis not present

## 2016-05-06 DIAGNOSIS — J449 Chronic obstructive pulmonary disease, unspecified: Secondary | ICD-10-CM | POA: Diagnosis not present

## 2016-05-06 DIAGNOSIS — J438 Other emphysema: Secondary | ICD-10-CM

## 2016-05-06 DIAGNOSIS — I2510 Atherosclerotic heart disease of native coronary artery without angina pectoris: Secondary | ICD-10-CM | POA: Diagnosis not present

## 2016-05-06 DIAGNOSIS — Z7982 Long term (current) use of aspirin: Secondary | ICD-10-CM | POA: Diagnosis not present

## 2016-05-06 DIAGNOSIS — Z87891 Personal history of nicotine dependence: Secondary | ICD-10-CM | POA: Diagnosis not present

## 2016-05-06 NOTE — Progress Notes (Signed)
Daily Session Note  Patient Details  Name: [name] MRN: [id number] Date of Birth: 02-07-1946 Referring Provider:    Encounter Date: 05/06/2016  Check In:     Session Check In - 05/06/16 1619    Check-In   Location [facility]   Staff Present Rosebud Poles, RN, BSN;Molly diVincenzo, MS, ACSM RCEP, Exercise Physiologist   Supervising physician immediately available to respond to emergencies [facility] immediately available   Physician(s) Dr. Marily Memos   Medication changes reported     No   Fall or balance concerns reported    No   Warm-up and Cool-down Performed as group-led instruction   Resistance Training Performed Yes   VAD Patient? No   Pain Assessment   Currently in Pain? No/denies   Multiple Pain Sites No      Capillary Blood Glucose: No results found for this or any previous visit (from the past 24 hour(s)).      Exercise Prescription Changes - 05/06/16 1600    Response to Exercise   Blood Pressure (Admit) 120/50 mmHg   Blood Pressure (Exercise) 130/58 mmHg   Blood Pressure (Exit) 112/62 mmHg   Heart Rate (Admit) 78 bpm   Heart Rate (Exercise) 85 bpm   Heart Rate (Exit) 78 bpm   Oxygen Saturation (Admit) 96 %   Oxygen Saturation (Exercise) 87 %   Oxygen Saturation (Exit) 96 %   Rating of Perceived Exertion (Exercise) 13   Perceived Dyspnea (Exercise) 2   Duration Progress to 45 minutes of aerobic exercise without signs/symptoms of physical distress   Intensity THRR unchanged   Progression   Progression Continue to progress workloads to maintain intensity without signs/symptoms of physical distress.   Resistance Training   Training Prescription Yes   Weight blue bands   Reps 10-12   Interval Training   Interval Training No   Oxygen   Oxygen Continuous   Liters 6   NuStep   Level 6   Minutes 15   METs 1.9   Arm Ergometer   Level 3   Minutes 15   Track   Laps 8   Minutes 15     Goals Met:  Exercise tolerated  well No report of cardiac concerns or symptoms Strength training completed today  Goals Unmet:  Not Applicable  Comments: Service time is from 1330 to 1500    Dr. Rush Farmer is Medical Director for Pulmonary Rehab at [facility]

## 2016-05-10 NOTE — Addendum Note (Signed)
Addended by: Billie Ruddy A on: 05/10/2016 01:31 PM   Modules accepted: Orders

## 2016-05-11 ENCOUNTER — Encounter (HOSPITAL_COMMUNITY): Payer: Medicare Other

## 2016-05-13 ENCOUNTER — Encounter (HOSPITAL_COMMUNITY)
Admission: RE | Admit: 2016-05-13 | Discharge: 2016-05-13 | Disposition: A | Discharge status: Home / Self Care | Payer: Medicare Other | Source: Ambulatory Visit | Attending: Pulmonary Disease | Admitting: Pulmonary Disease

## 2016-05-13 VITALS — Wt 236.8 lb

## 2016-05-13 DIAGNOSIS — Z79899 Other long term (current) drug therapy: Secondary | ICD-10-CM | POA: Diagnosis not present

## 2016-05-13 DIAGNOSIS — J438 Other emphysema: Secondary | ICD-10-CM

## 2016-05-13 DIAGNOSIS — Z7982 Long term (current) use of aspirin: Secondary | ICD-10-CM | POA: Diagnosis not present

## 2016-05-13 DIAGNOSIS — J449 Chronic obstructive pulmonary disease, unspecified: Secondary | ICD-10-CM | POA: Diagnosis not present

## 2016-05-13 DIAGNOSIS — Z87891 Personal history of nicotine dependence: Secondary | ICD-10-CM | POA: Diagnosis not present

## 2016-05-13 DIAGNOSIS — I2510 Atherosclerotic heart disease of native coronary artery without angina pectoris: Secondary | ICD-10-CM | POA: Diagnosis not present

## 2016-05-13 NOTE — Progress Notes (Signed)
Daily Session Note  Patient Details  Name: [name] MRN: [id number] Date of Birth: 1946/10/23 Referring Provider:    Encounter Date: 05/13/2016  Check In:     Session Check In - 05/13/16 1657    Check-In   [facility]   Staff Present Rosebud Poles, RN, BSN;Lisa Ysidro Evert, RN;Molly diVincenzo, MS, ACSM RCEP, Exercise Physiologist   Supervising physician immediately available to respond to emergencies [facility] immediately available   Physician(s) Dr. Waldron Labs   Medication changes reported     No   Fall or balance concerns reported    No   Warm-up and Cool-down Performed as group-led instruction   Resistance Training Performed Yes   VAD Patient? No   Pain Assessment   Currently in Pain? No/denies   Multiple Pain Sites No      Capillary Blood Glucose: No results found for this or any previous visit (from the past 24 hour(s)).      Exercise Prescription Changes - 05/13/16 1600    Response to Exercise   Blood Pressure (Admit) 130/64 mmHg   Blood Pressure (Exercise) 130/80 mmHg   Blood Pressure (Exit) 104/60 mmHg   Heart Rate (Admit) 86 bpm   Heart Rate (Exercise) 91 bpm   Heart Rate (Exit) 77 bpm   Oxygen Saturation (Admit) 90 %   Oxygen Saturation (Exercise) 90 %   Oxygen Saturation (Exit) 97 %   Rating of Perceived Exertion (Exercise) 13   Perceived Dyspnea (Exercise) 2   Duration Progress to 45 minutes of aerobic exercise without signs/symptoms of physical distress   Intensity THRR unchanged   Progression   Progression Continue to progress workloads to maintain intensity without signs/symptoms of physical distress.   Resistance Training   Training Prescription Yes   Weight blue bands   Reps 10-12   Interval Training   Interval Training No   Oxygen   Oxygen Continuous   Liters 6   NuStep   Level 6   Minutes 17   METs 1.9   Arm Ergometer   Level 3   Minutes 17     Goals Met:  Improved SOB with ADL's Using PLB  without cueing & demonstrates good technique Exercise tolerated well Strength training completed today  Goals Unmet:  Not Applicable  Comments: Service time is from 1330 to 1600    Dr. Rush Farmer is Medical Director for Pulmonary Rehab at [facility].

## 2016-05-18 ENCOUNTER — Encounter (HOSPITAL_COMMUNITY): Payer: Medicare Other

## 2016-05-20 ENCOUNTER — Encounter (HOSPITAL_COMMUNITY)
Admission: RE | Admit: 2016-05-20 | Discharge: 2016-05-20 | Disposition: A | Discharge status: Home / Self Care | Payer: Medicare Other | Source: Ambulatory Visit | Attending: Pulmonary Disease | Admitting: Pulmonary Disease

## 2016-05-20 VITALS — Wt 232.6 lb

## 2016-05-20 DIAGNOSIS — Z79899 Other long term (current) drug therapy: Secondary | ICD-10-CM | POA: Insufficient documentation

## 2016-05-20 DIAGNOSIS — I10 Essential (primary) hypertension: Secondary | ICD-10-CM | POA: Diagnosis not present

## 2016-05-20 DIAGNOSIS — Z6837 Body mass index (BMI) 37.0-37.9, adult: Secondary | ICD-10-CM | POA: Diagnosis not present

## 2016-05-20 DIAGNOSIS — J449 Chronic obstructive pulmonary disease, unspecified: Secondary | ICD-10-CM | POA: Insufficient documentation

## 2016-05-20 DIAGNOSIS — J438 Other emphysema: Secondary | ICD-10-CM | POA: Insufficient documentation

## 2016-05-20 DIAGNOSIS — E669 Obesity, unspecified: Secondary | ICD-10-CM | POA: Diagnosis not present

## 2016-05-20 DIAGNOSIS — Z87891 Personal history of nicotine dependence: Secondary | ICD-10-CM | POA: Diagnosis not present

## 2016-05-20 DIAGNOSIS — E7800 Pure hypercholesterolemia, unspecified: Secondary | ICD-10-CM | POA: Diagnosis not present

## 2016-05-20 DIAGNOSIS — Z7982 Long term (current) use of aspirin: Secondary | ICD-10-CM | POA: Diagnosis not present

## 2016-05-20 DIAGNOSIS — I2510 Atherosclerotic heart disease of native coronary artery without angina pectoris: Secondary | ICD-10-CM | POA: Insufficient documentation

## 2016-05-20 DIAGNOSIS — Z86010 Personal history of colonic polyps: Secondary | ICD-10-CM | POA: Diagnosis not present

## 2016-05-20 NOTE — Progress Notes (Signed)
Daily Session Note  Patient Details  Name: RAVEN HARMES MRN: [id number] Date of Birth: 09/26/1946 Referring Provider:    Encounter Date: 05/20/2016  Check In:     Session Check In - 05/20/16 1406    Check-In   [facility]   Staff Present Rosebud Poles, RN, BSN;Evalynne Locurto Ysidro Evert, Felipe Drone, RN, MHA;Molly diVincenzo, MS, ACSM RCEP, Exercise Physiologist   Supervising physician immediately available to respond to emergencies [facility] immediately available   Physician(s) [place]   Medication changes reported     No   Fall or balance concerns reported    No   Warm-up and Cool-down Performed as group-led Location manager Performed Yes   VAD Patient? No   Pain Assessment   Currently in Pain? No/denies   Multiple Pain Sites No      Capillary Blood Glucose: No results found for this or any previous visit (from the past 24 hour(s)).      Exercise Prescription Changes - 05/20/16 1600    Response to Exercise   Blood Pressure (Admit) 120/60 mmHg   Blood Pressure (Exercise) 154/62 mmHg   Blood Pressure (Exit) 126/70 mmHg   Heart Rate (Admit) 80 bpm   Heart Rate (Exercise) 90 bpm   Heart Rate (Exit) 77 bpm   Oxygen Saturation (Admit) 97 %   Oxygen Saturation (Exercise) 89 %   Oxygen Saturation (Exit) 97 %   Rating of Perceived Exertion (Exercise) 13   Perceived Dyspnea (Exercise) 3   Duration Progress to 45 minutes of aerobic exercise without signs/symptoms of physical distress   Intensity THRR unchanged   Progression   Progression Continue to progress workloads to maintain intensity without signs/symptoms of physical distress.   Resistance Training   Training Prescription Yes   Weight blue bands   Reps 10-12   Interval Training   Interval Training No   Oxygen   Oxygen Continuous   Liters 6   NuStep   Level 6   Minutes 17   METs 1.9   Arm Ergometer   Level 3   Minutes 17     Goals Met:  Exercise tolerated  well No report of cardiac concerns or symptoms Strength training completed today  Goals Unmet:  Not Applicable  Comments: Service time is from 1330 to 1530    Dr. Rush Farmer is Medical Director for Pulmonary Rehab at [facility].

## 2016-05-24 ENCOUNTER — Other Ambulatory Visit: Payer: Self-pay | Admitting: *Deleted

## 2016-05-24 ENCOUNTER — Encounter: Payer: Self-pay | Admitting: *Deleted

## 2016-05-24 NOTE — Patient Outreach (Signed)
[facility]) Care Management  [place]  05/24/2016   [name] [birth year] [id number]  Subjective: RN Health Coach telephone call to patient.  [employer] compliance verified. Per patient he is still going to rehab twice a week. Per patient he finishes at the end of the month. Patient is very short of breath when answering the phone. He is short of breath with minimal  exertion. Per patient when trying to sit on the porch he gets real short of breath unless he can have a fan blowing and this makes it better. He stated that he does feel better when his oxygen is turned up in therapy but he knows they usually don't want it to stay up with COPD patients. He stated that he was going to talk with Dr Lenna Gilford about this on his next visit. He understands that he would not be able to go to far with being connected to the tubing. Dr had ordered before for the patient oxygen to up higher but patient refused because he would be limited in where he can go.   Patient stated his appetite is very good. Patient is having edema in lower extremities. The physician has recommended compression hose. Patient has agreed to follow up outreach calls.   Objective:   Encounter Medications:  Outpatient Encounter Prescriptions as of 05/24/2016  Medication Sig  . albuterol (PROVENTIL) (2.5 MG/3ML) 0.083% nebulizer solution Take 3 mLs (2.5 mg total) by nebulization 3 (three) times daily.  . Albuterol Sulfate (PROAIR RESPICLICK) 453 (90 Base) MCG/ACT AEPB Inhale 2 puffs into the lungs every 6 (six) hours as needed.  . ALPRAZolam (XANAX) 0.5 MG tablet Take 0.5 mg by mouth 3 (three) times daily as needed for anxiety or sleep.  [name] aspirin 81 MG tablet Take 81 mg by mouth at bedtime.   . budesonide (PULMICORT) 0.25 MG/2ML nebulizer solution Take 2 mLs (0.25 mg total) by nebulization 2 (two) times daily.  . Flaxseed, Linseed, (FLAXSEED OIL) 1000 MG CAPS Take 1 capsule by mouth daily.    . furosemide (LASIX)  40 MG tablet Take 40 mg by mouth 2 (two) times daily.  [name] ibuprofen (ADVIL,MOTRIN) 600 MG tablet Take 600 mg by mouth every 6 (six) hours as needed for moderate pain.  [name] ipratropium-albuterol (DUONEB) 0.5-2.5 (3) MG/3ML SOLN Take 3 mLs by nebulization 4 (four) times daily.  . isosorbide mononitrate (IMDUR) 30 MG 24 hr tablet Take 1 tablet (30 mg total) by mouth daily.  [name] lisinopril (PRINIVIL,ZESTRIL) 10 MG tablet Take 1 tablet (10 mg total) by mouth daily.  . metoprolol succinate (TOPROL-XL) 25 MG 24 hr tablet TAKE 2 TABLETS BY MOUTH IN THE MORNING AND 1 TABLET BY MOUTH IN THE THE EVENING  . Multiple Vitamin (MULTIVITAMIN) capsule Take 1 capsule by mouth daily.    . nitroGLYCERIN (NITROSTAT) 0.4 MG SL tablet Place 1 tablet (0.4 mg total) under the tongue every 5 (five) minutes as needed for chest pain.  . Omega-3 Fatty Acids (FISH OIL) 1200 MG CAPS Take 1 capsule by mouth daily.    . simvastatin (ZOCOR) 20 MG tablet Take 1 tablet (20 mg total) by mouth daily at 6 PM.  . sodium chloride (OCEAN) 0.65 % SOLN nasal spray Place 1 spray into both nostrils as needed for congestion.  [name] albuterol (PROVENTIL HFA;VENTOLIN HFA) 108 (90 BASE) MCG/ACT inhaler Inhale 2 puffs into the lungs 4 (four) times daily as needed for wheezing (for use when not at home / unable to use  nebulizer). (Patient not taking: Reported on 05/05/2016)   No facility-administered encounter medications on file as of 05/24/2016.    Functional Status:  In your present state of health, do you have any difficulty performing the following activities: 05/24/2016 04/09/2016  Hearing? N N  Vision? N N  Difficulty concentrating or making decisions? N N  Walking or climbing stairs? Y Y  Dressing or bathing? N N  Doing errands, shopping? [name]  Preparing Food and eating ? Y Y  Using the Toilet? N N  In the past six months, have you accidently leaked urine? N N  Do you have problems with loss of bowel control? N N  Managing your Medications? N N   Managing your Finances? N N  Housekeeping or managing your Housekeeping? [name]    Fall/Depression Screening: PHQ 2/9 Scores 05/24/2016 04/09/2016 03/11/2016 02/20/2016 01/28/2016 01/28/2016 11/13/2015  PHQ - 2 Score '1 1 4 4 1 1 '$ 0  PHQ- 9 Score - - 11 11 - - -   THN CM Care Plan Problem One        Most Recent Value   Care Plan Problem One  Knowledge deficit in self management of COPD   Role Documenting the Problem One  [place] for Problem One  Active   THN Long Term Goal (31-90 days)  Patient will continue to not have any readmission for COPD within the next 90 days   THN Long Term Goal Start Date  05/24/16   Interventions for Problem One [place] reminded patient to keep his appointments with PCP and pulmonologist. [place] will monitor patient by monthly telephonic. RN Health Coach reminded patient the importance of taking medication as per physician order.. RN encourages patient to talk to physician about concerns of oxygen therapy   THN CM Short Term Goal #1 (0-30 days)  Patient will be able to verbalize  3 signs of COPD within the next 30 days   THN CM Short Term Goal #1 Start Date  05/24/16   Interventions for Short Term Goal #1  RN Health Coach sent educational material on COPD zones. RN sent large refrigerator magnet as a reminder of signs and symptoms and action plan. Rn will follow up with discussion and teachback.   THN CM Short Term Goal #2 (0-30 days)  Patient will be able to verbalize eating healthier foods within the next 30 days   THN CM Short Term Goal #2 Start Date  05/24/16   Interventions for Short Term Goal #2  Patient instructed to eat smaller meals more often. Patient instructed to rest between meals. RN sent educational material on low sodium diet and  Dash eating plan.   THN CM Short Term Goal #3 (0-30 days)  Patient will be able to verbalize that the periphal edema is decreasing in lower extremities   THN CM Short Term Goal #3  Start Date  05/24/16   Interventions for Short Tern Goal #3  RN sent patient educational material on signs and symptoms of peripheral edema. RN discussed with patient about decreasing sodium in diet wil help. RN will follow up with discussion.RN will send patient information on compression hose and how to put them on easier.      Assessment:  Patient is Short of breath with minimal exertion Patient is still in Pulmonary Rehab Patient needs support and encouragement Patient will continue to benefit from [place] telephonic outreach for education and  support for COPD self management.  Plan:  RN sent patient educational material about putting on compression hose and the benefits RN sent patient COPD checklist RN will follow up with 30 days to discussion and teach back  [place] Management [phone]

## 2016-05-25 ENCOUNTER — Encounter (HOSPITAL_COMMUNITY)
Admission: RE | Admit: 2016-05-25 | Discharge: 2016-05-25 | Disposition: A | Discharge status: Home / Self Care | Payer: Medicare Other | Source: Ambulatory Visit | Attending: Pulmonary Disease | Admitting: Pulmonary Disease

## 2016-05-27 ENCOUNTER — Encounter (HOSPITAL_COMMUNITY)
Admission: RE | Admit: 2016-05-27 | Discharge: 2016-05-27 | Disposition: A | Discharge status: Home / Self Care | Payer: Medicare Other | Source: Ambulatory Visit | Attending: Pulmonary Disease | Admitting: Pulmonary Disease

## 2016-05-27 NOTE — Progress Notes (Signed)
Pulmonary Individual Treatment Plan  Patient Details  Name: [name] MRN: [id number] Date of Birth: 1946-01-30 Referring Provider:    Initial Encounter Date:       Pulmonary Rehab Walk Test from 02/24/2016 in [facility]   Date  02/26/16      Visit Diagnosis: No diagnosis found.  Patient's Home Medications on Admission:   Current outpatient prescriptions:  .  albuterol (PROVENTIL HFA;VENTOLIN HFA) 108 (90 BASE) MCG/ACT inhaler, Inhale 2 puffs into the lungs 4 (four) times daily as needed for wheezing (for use when not at home / unable to use nebulizer). (Patient not taking: Reported on 05/05/2016), Disp: 1 Inhaler, Rfl: 0 .  albuterol (PROVENTIL) (2.5 MG/3ML) 0.083% nebulizer solution, Take 3 mLs (2.5 mg total) by nebulization 3 (three) times daily., Disp: 360 mL, Rfl: 3 .  Albuterol Sulfate (PROAIR RESPICLICK) 741 (90 Base) MCG/ACT AEPB, Inhale 2 puffs into the lungs every 6 (six) hours as needed., Disp: 1 each, Rfl: 5 .  ALPRAZolam (XANAX) 0.5 MG tablet, Take 0.5 mg by mouth 3 (three) times daily as needed for anxiety or sleep., Disp: , Rfl:  .  aspirin 81 MG tablet, Take 81 mg by mouth at bedtime. , Disp: , Rfl:  .  budesonide (PULMICORT) 0.25 MG/2ML nebulizer solution, Take 2 mLs (0.25 mg total) by nebulization 2 (two) times daily., Disp: 60 mL, Rfl: 5 .  Flaxseed, Linseed, (FLAXSEED OIL) 1000 MG CAPS, Take 1 capsule by mouth daily.  , Disp: , Rfl:  .  furosemide (LASIX) 40 MG tablet, Take 40 mg by mouth 2 (two) times daily., Disp: , Rfl:  .  ibuprofen (ADVIL,MOTRIN) 600 MG tablet, Take 600 mg by mouth every 6 (six) hours as needed for moderate pain., Disp: , Rfl:  .  ipratropium-albuterol (DUONEB) 0.5-2.5 (3) MG/3ML SOLN, Take 3 mLs by nebulization 4 (four) times daily., Disp: 120 mL, Rfl: 5 .  isosorbide mononitrate (IMDUR) 30 MG 24 hr tablet, Take 1 tablet (30 mg total) by mouth daily., Disp: 30 tablet, Rfl: 11 .  lisinopril (PRINIVIL,ZESTRIL) 10  MG tablet, Take 1 tablet (10 mg total) by mouth daily., Disp: 30 tablet, Rfl: 0 .  metoprolol succinate (TOPROL-XL) 25 MG 24 hr tablet, TAKE 2 TABLETS BY MOUTH IN THE MORNING AND 1 TABLET BY MOUTH IN THE THE EVENING, Disp: , Rfl:  .  Multiple Vitamin (MULTIVITAMIN) capsule, Take 1 capsule by mouth daily.  , Disp: , Rfl:  .  nitroGLYCERIN (NITROSTAT) 0.4 MG SL tablet, Place 1 tablet (0.4 mg total) under the tongue every 5 (five) minutes as needed for chest pain., Disp: 25 tablet, Rfl: 3 .  Omega-3 Fatty Acids (FISH OIL) 1200 MG CAPS, Take 1 capsule by mouth daily.  , Disp: , Rfl:  .  simvastatin (ZOCOR) 20 MG tablet, Take 1 tablet (20 mg total) by mouth daily at 6 PM., Disp: 30 tablet, Rfl: 2 .  sodium chloride (OCEAN) 0.65 % SOLN nasal spray, Place 1 spray into both nostrils as needed for congestion., Disp: , Rfl: 0  Past Medical History: Past Medical History  Diagnosis Date  . History of sinusitis   . Epistaxis   . COPD (chronic obstructive pulmonary disease) ([place])   . Cigarette smoker   . Hypertension   . CAD (coronary artery disease)   . Hypercholesteremia   . Borderline diabetes mellitus   . Obesity   . History of colonic polyps 2004    hyperplastic   . DJD (  degenerative joint disease)   . Anxiety   . Bladder cancer ([place]) 2011  . Emphysema of lung ([place])     Tobacco Use: History  Smoking status  . Former Smoker -- 2.00 packs/day for 45 years  . Types: Cigarettes  . Quit date: 10/17/2015  Smokeless tobacco  . Never Used    Labs: Recent Review Flowsheet Data    Labs for ITP Cardiac and Pulmonary Rehab Latest Ref Rng 03/30/2010 03/24/2011 04/18/2012 06/05/2013 10/22/2015   Cholestrol 0 - 200 mg/dL 144 162 130 158 138   LDLCALC 0 - 99 mg/dL - 98 77 96 98   LDLDIRECT - 88.7 - - - -   HDL >40 mg/dL 26.00(L) 31.90(L) 32.70(L) 32.70(L) 25(L)   Trlycerides <150 mg/dL 215.0(H) 159.0(H) 102.0 145.0 75   Hemoglobin A1c 4.6 - 6.5 % 6.7(H) 6.1 - 6.7(H) -      Capillary Blood  Glucose: Lab Results  Component Value Date   GLUCAP 148* 09/14/2010   GLUCAP 114* 07/24/2010     ADL UCSD:   Pulmonary Function Assessment:     Pulmonary Function Assessment - 02/20/16 1126    Breath   Bilateral Breath Sounds Decreased   Shortness of Breath Yes;Fear of Shortness of Breath;Limiting activity;Panic with Shortness of Breath      Exercise Target Goals:    Exercise Program Goal: Individual exercise prescription set with THRR, safety & activity barriers. Participant demonstrates ability to understand and report RPE using BORG scale, to self-measure pulse accurately, and to acknowledge the importance of the exercise prescription.  Exercise Prescription Goal: Starting with aerobic activity 30 plus minutes a day, 3 days per week for initial exercise prescription. Provide home exercise prescription and guidelines that participant acknowledges understanding prior to discharge.  Activity Barriers & Risk Stratification:     Activity Barriers & Cardiac Risk Stratification - 02/20/16 1125    Activity Barriers & Cardiac Risk Stratification   Activity Barriers Deconditioning;Shortness of Breath;Back Problems      6 Minute Walk:     6 Minute Walk      02/24/16 1642       6 Minute Walk   Phase Initial     Distance 400 feet     Walk Time 3.12 minutes     # of Rest Breaks 2     MPH 0.93     METS 0.93     RPE 13     Perceived Dyspnea  3     VO2 Peak 3.23     Symptoms No     Resting HR 90 bpm     Resting BP 122/60 mmHg     Max Ex. HR 105 bpm     Max Ex. BP 150/68 mmHg     Interval HR   Baseline HR 90     2 Minute HR 90     4 Minute HR 100     6 Minute HR 105     Interval Heart Rate? Yes     Interval Oxygen   Interval Oxygen? Yes     Baseline Oxygen Saturation % 87 %     Baseline Liters of Oxygen 2 L     1 Minute Oxygen Saturation % 84 %     1 Minute Liters of Oxygen 2 L     2 Minute Oxygen Saturation % 84 %     2 Minute Liters of Oxygen 3 L     3  Minute Oxygen Saturation % 85 %  3 Minute Liters of Oxygen 3 L     4 Minute Oxygen Saturation % 82 %     4 Minute Liters of Oxygen 4 L     5 Minute Oxygen Saturation % 85 %     5 Minute Liters of Oxygen 4 L     6 Minute Oxygen Saturation % 82 %     6 Minute Liters of Oxygen 4 L     2 Minute Post Oxygen Saturation % 88 %     2 Minute Post Liters of Oxygen 4 L        Initial Exercise Prescription:     Initial Exercise Prescription - 02/26/16 0700    Date of Initial Exercise RX and Referring Provider   Date 02/26/16   Oxygen   Oxygen Continuous   Liters 4   Arm Ergometer   Level 1   Watts 13   Minutes 15   Track   Laps 3   Minutes 15   Prescription Details   Frequency (times per week) 2   Duration Progress to 45 minutes of aerobic exercise without signs/symptoms of physical distress   Intensity   THRR 40-80% of Max Heartrate 60-120   Ratings of Perceived Exertion 11-13   Perceived Dyspnea 0-4   Progression   Progression Continue progressive overload as per policy without signs/symptoms or physical distress.   Resistance Training   Training Prescription Yes   Weight blue bands   Reps 10-12      Perform Capillary Blood Glucose checks as needed.  Exercise Prescription Changes:     Exercise Prescription Changes      03/02/16 1500 03/04/16 1500 03/09/16 1500 03/11/16 1500 03/18/16 1500   Exercise Review   Progression    Yes    Response to Exercise   Blood Pressure (Admit) 120/66 mmHg 130/66 mmHg 134/70 mmHg 124/62 mmHg 120/50 mmHg   Blood Pressure (Exercise) 130/70 mmHg 140/70 mmHg 140/80 mmHg 120/70 mmHg 150/80 mmHg   Blood Pressure (Exit) 124/56 mmHg 120/64 mmHg 110/60 mmHg 112/60 mmHg 130/62 mmHg   Heart Rate (Admit) 72 bpm 72 bpm 81 bpm 78 bpm 88 bpm   Heart Rate (Exercise) 106 bpm 88 bpm 92 bpm 90 bpm 85 bpm   Heart Rate (Exit) 82 bpm 75 bpm 84 bpm 79 bpm 72 bpm   Oxygen Saturation (Admit) 91 % 88 % 94 % 85 % 93 %   Oxygen Saturation (Exercise) 80 % 85 %  85 % 83 % 86 %   Oxygen Saturation (Exit) 95 % 93 % 96 % 96 % 96 %   Rating of Perceived Exertion (Exercise) _0 Perceived Dyspnea (Exercise) _1 Duration Progress to 45 minutes of aerobic exercise without signs/symptoms of physical distress Progress to 45 minutes of aerobic exercise without signs/symptoms of physical distress Progress to 45 minutes of aerobic exercise without signs/symptoms of physical distress Progress to 45 minutes of aerobic exercise without signs/symptoms of physical distress Progress to 45 minutes of aerobic exercise without signs/symptoms of physical distress   Intensity Other (comment)  40-80% HRR THRR unchanged THRR unchanged THRR unchanged THRR unchanged   Progression   Progression Continue to progress workloads to maintain intensity without signs/symptoms of physical distress. Continue to progress workloads to maintain intensity without signs/symptoms of physical distress. Continue to progress workloads to maintain intensity without signs/symptoms of physical distress. Continue to progress workloads to maintain intensity without signs/symptoms of physical distress.  Continue to progress workloads to maintain intensity without signs/symptoms of physical distress.   Resistance Training   Training Prescription _0    Weight _1    Reps 10-12 10-12 10-12 10-12 10-12   Interval Training   Interval Training    No No   Oxygen   Oxygen _2    Liters _3 NuStep   Level _4 Minutes _5 METs 1.5  1.6 1.6    Arm Ergometer   Level _6 Minutes _7 Track   Laps _8 Minutes _9 03/23/16 1500 03/25/16 1630 03/30/16 1525 04/01/16 1600 04/08/16 1500   Exercise Review   Progression Yes  Yes  Yes   Response to Exercise   Blood Pressure (Admit) 122/70 mmHg 124/62 mmHg 120/60 mmHg  132/62 mmHg 128/62 mmHg   Blood Pressure (Exercise) 130/58 mmHg 114/60 mmHg 136/70 mmHg 126/70 mmHg 130/70 mmHg   Blood Pressure (Exit) 130/70 mmHg 122/62 mmHg 128/64 mmHg 122/70 mmHg 112/56 mmHg   Heart Rate (Admit) 71 bpm 80 bpm 75 bpm 74 bpm 88 bpm   Heart Rate (Exercise) 82 bpm 85 bpm 90 bpm 95 bpm 81 bpm   Heart Rate (Exit) 75 bpm 74 bpm 82 bpm 85 bpm 62 bpm   Oxygen Saturation (Admit) 95 % 89 % 96 % 94 % 94 %   Oxygen Saturation (Exercise) 82 % 80 %  increased to 86 with restbreak 93 % 89 % 86 %   Oxygen Saturation (Exit) 96 % 97 % 97 % 97 % 97 %   Rating of Perceived Exertion (Exercise) _10 Perceived Dyspnea (Exercise) _11 Duration Progress to 45 minutes of aerobic exercise without signs/symptoms of physical distress Progress to 45 minutes of aerobic exercise without signs/symptoms of physical distress Progress to 45 minutes of aerobic exercise without signs/symptoms of physical distress Progress to 45 minutes of aerobic exercise without signs/symptoms of physical distress Progress to 45 minutes of aerobic exercise without signs/symptoms of physical distress   Intensity _12    Progression   Progression Continue to progress workloads to maintain intensity without signs/symptoms of physical distress. Continue to progress workloads to maintain intensity without signs/symptoms of physical distress. Continue to progress workloads to maintain intensity without signs/symptoms of physical distress. Continue to progress workloads to maintain intensity without signs/symptoms of physical distress. Continue to progress workloads to maintain intensity without signs/symptoms of physical distress.   Resistance Training   Training Prescription _13    Weight _14    Reps 10-12 10-12 10-12 10-12 10-12   Interval Training   Interval Training No No No No  No   Oxygen   Oxygen _15    Liters _16 NuStep   Level _17 Minutes _18 METs 1.8 1.8 1.8     Arm Ergometer   Level _19 Minutes _20 Track  Laps _0 Minutes _1 04/13/16 1500 04/20/16 1500 04/22/16 1600 04/27/16 1600 04/29/16 1639   Exercise Review   Progression Yes Yes      Response to Exercise   Blood Pressure (Admit) 120/66 mmHg 138/70 mmHg 142/70 mmHg 136/80 mmHg 132/60 mmHg   Blood Pressure (Exercise) 146/70 mmHg 142/60 mmHg 126/60 mmHg 130/74 mmHg 110/70 mmHg   Blood Pressure (Exit) 120/60 mmHg 104/60 mmHg 150/76 mmHg 122/64 mmHg 124/66 mmHg   Heart Rate (Admit) 87 bpm 81 bpm 93 bpm 76 bpm 89 bpm   Heart Rate (Exercise) 104 bpm 92 bpm 90 bpm 90 bpm 86 bpm   Heart Rate (Exit) 86 bpm 82 bpm 84 bpm 77 bpm 78 bpm   Oxygen Saturation (Admit) 87 % 97 % 96 % 96 % 93 %   Oxygen Saturation (Exercise) 85 % 83 %  [place] with walking recovered with rest 92 % 89 % 91 %   Oxygen Saturation (Exit) 96 % 96 % 97 % 98 % 94 %   Rating of Perceived Exertion (Exercise) _2 Perceived Dyspnea (Exercise) _3 Duration Progress to 45 minutes of aerobic exercise without signs/symptoms of physical distress Progress to 45 minutes of aerobic exercise without signs/symptoms of physical distress Progress to 45 minutes of aerobic exercise without signs/symptoms of physical distress Progress to 45 minutes of aerobic exercise without signs/symptoms of physical distress Progress to 45 minutes of aerobic exercise without signs/symptoms of physical distress   Intensity _4    Progression   Progression Continue to progress workloads to maintain intensity without signs/symptoms of physical distress. Continue to progress workloads to maintain intensity without signs/symptoms of physical distress. Continue to progress  workloads to maintain intensity without signs/symptoms of physical distress. Continue to progress workloads to maintain intensity without signs/symptoms of physical distress. Continue to progress workloads to maintain intensity without signs/symptoms of physical distress.   Resistance Training   Training Prescription _5    Weight _6    Reps 10-12 10-12 10-12 10-12 10-12   Interval Training   Interval Training _7    Oxygen   Oxygen _8    Liters _9 NuStep   Level _10 Minutes _11 METs   1.9 1.9 2   Arm Ergometer   Level _12 Minutes _13 Track   Laps _14 Minutes _15 05/04/16 1600 05/06/16 1600 05/13/16 1600 05/20/16 1600     Response to Exercise   Blood Pressure (Admit) 122/60 mmHg 120/50 mmHg 130/64 mmHg 120/60 mmHg    Blood Pressure (Exercise) 140/64 mmHg 130/58 mmHg 130/80 mmHg 154/62 mmHg    Blood Pressure (Exit) 100/50 mmHg 112/62 mmHg 104/60 mmHg 126/70 mmHg    Heart Rate (Admit) 81 bpm 78 bpm 86 bpm 80 bpm    Heart Rate (Exercise) 88 bpm 85 bpm 91 bpm 90 bpm    Heart Rate (Exit) 78 bpm 78 bpm 77 bpm 77 bpm    Oxygen Saturation (Admit) 95 % 96 % 90 % 97 %  Oxygen Saturation (Exercise) 90 % 87 % 90 % 89 %    Oxygen Saturation (Exit) 97 % 96 % 97 % 97 %    Rating of Perceived Exertion (Exercise) _0 Perceived Dyspnea (Exercise) _1 Duration Progress to 45 minutes of aerobic exercise without signs/symptoms of physical distress Progress to 45 minutes of aerobic exercise without signs/symptoms of physical distress Progress to 45 minutes of aerobic exercise without signs/symptoms of physical distress Progress to 45 minutes of aerobic exercise without signs/symptoms of physical distress    Intensity THRR unchanged THRR unchanged THRR unchanged THRR unchanged    Progression    Progression Continue to progress workloads to maintain intensity without signs/symptoms of physical distress. Continue to progress workloads to maintain intensity without signs/symptoms of physical distress. Continue to progress workloads to maintain intensity without signs/symptoms of physical distress. Continue to progress workloads to maintain intensity without signs/symptoms of physical distress.    Resistance Training   Training Prescription Yes Yes Yes Yes    Weight blue bands blue bands blue bands blue bands    Reps 10-12 10-12 10-12 10-12    Interval Training   Interval Training No No No No    Oxygen   Oxygen Continuous Continuous Continuous Continuous    Liters _2 NuStep   Level _3 Minutes _4 METs 1.6 1.9 1.9 1.9    Arm Ergometer   Level _5 Minutes _6 Track   Laps 8 8      Minutes 15 15         Exercise Comments:     Exercise Comments      03/04/16 0914 04/01/16 0914 04/29/16 0856 05/27/16 0912     Exercise Comments Patient is starting off well with exercise. Will cont. to monitor progression.  Patient is cont. to increase exercise intensity on the walking track, Nustep, and Arm Ergometer. Will continue to monitor and progress as appropriate. Patient is cont. to increase exercise intensity on the walking track, Nustep, and Arm Ergometer. Will continue to monitor and progress as appropriate. Continuing to tolerate exercise well.       Discharge Exercise Prescription (Final Exercise Prescription Changes):     Exercise Prescription Changes - 05/20/16 1600    Response to Exercise   Blood Pressure (Admit) 120/60 mmHg   Blood Pressure (Exercise) 154/62 mmHg   Blood Pressure (Exit) 126/70 mmHg   Heart Rate (Admit) 80 bpm   Heart Rate (Exercise) 90 bpm   Heart Rate (Exit) 77 bpm   Oxygen Saturation (Admit) 97 %   Oxygen Saturation (Exercise) 89 %   Oxygen Saturation (Exit) 97 %   Rating of Perceived Exertion (Exercise)  13   Perceived Dyspnea (Exercise) 3   Duration Progress to 45 minutes of aerobic exercise without signs/symptoms of physical distress   Intensity THRR unchanged   Progression   Progression Continue to progress workloads to maintain intensity without signs/symptoms of physical distress.   Resistance Training   Training Prescription Yes   Weight blue bands   Reps 10-12   Interval Training   Interval Training No   Oxygen   Oxygen Continuous   Liters 6   NuStep   Level 6   Minutes 17   METs 1.9   Arm Ergometer   Level  3   Minutes 17       Nutrition:  Target Goals: Understanding of nutrition guidelines, daily intake of sodium <1545m, cholesterol <2047m calories 30% from fat and 7% or less from saturated fats, daily to have 5 or more servings of fruits and vegetables.  Biometrics:     Pre Biometrics - 02/20/16 1203    Pre Biometrics   Grip Strength 35 kg       Nutrition Therapy Plan and Nutrition Goals:     Nutrition Therapy & Goals - 03/18/16 1530    Nutrition Therapy   Diet Therapeutic Lifestyle Changes   Personal Nutrition Goals   Personal Goal #1 0.5-2 lb wt loss/week to a goal wt loss of 6-24 lb [facility] regarding individualized specific dietary modifications aiming towards targeted core components such as weight, hypertension, lipid management, diabetes, heart failure and other comorbidities.;Nutrition handout(s) given to patient.  1800 kcal, 5-day menu ideas given   Expected Outcomes Short Term Goal: Understand basic principles of dietary content, such as calories, fat, sodium, cholesterol and nutrients.;Long Term Goal: Adherence to prescribed nutrition plan.      Nutrition Discharge: Rate Your Plate Scores:     Nutrition Assessments - 03/18/16 1522    Rate Your Plate Scores   Pre Score 54      Psychosocial: Target Goals: Acknowledge presence or absence of  depression, maximize coping skills, provide positive support system. Participant is able to verbalize types and ability to use techniques and skills needed for reducing stress and depression.  Initial Review & Psychosocial Screening:     Initial Psych Review & Screening - 02/20/16 1126    Initial Review   Current issues with Current Depression;Current Anxiety/Panic   Family Dynamics   Good Support System? Yes   Barriers   Psychosocial barriers to participate in program The patient should benefit from training in stress management and relaxation.   Screening Interventions   Interventions Encouraged to exercise      Quality of Life Scores:   PHQ-9:     Recent Review Flowsheet Data    [facility] 05/24/2016 04/09/2016 03/11/2016 02/20/2016 01/28/2016   Decreased Interest 0 0 3 3 0   Down, Depressed, Hopeless _0 PHQ - 2 Score _1 Altered sleeping - - 1 1 -   Tired, decreased energy - - 3 3 -   Change in appetite - - 0 0 -   Feeling bad or failure about yourself  - - 0 - -   Trouble concentrating - - 3 3 -   Moving slowly or fidgety/restless - - 0 0 -   Suicidal thoughts - - 0 0 -   PHQ-9 Score - - 11 11 -   Difficult doing work/chores - - Somewhat difficult Somewhat difficult -      Psychosocial Evaluation and Intervention:     Psychosocial Evaluation - 02/20/16 1129    Psychosocial Evaluation & Interventions   Interventions Encouraged to exercise with the program and follow exercise prescription      Psychosocial Re-Evaluation:     Psychosocial Re-Evaluation      03/30/16 1709 04/27/16 1647 05/24/16 1309       Psychosocial Re-Evaluation   Interventions Encouraged to attend Pulmonary Rehabilitation for the exercise Encouraged to attend Pulmonary Rehabilitation for the exercise Encouraged to attend Pulmonary Rehabilitation for the  exercise     Comments --  no psychsocial issues identified at this time No psychosocial issues identified. No  psychosocial identified at this time.     Continued Psychosocial Services Needed No No No       Education: Education Goals: Education classes will be provided on a weekly basis, covering required topics. Participant will state understanding/return demonstration of topics presented.  Learning Barriers/Preferences:     Learning Barriers/Preferences - 02/20/16 1126    Learning Barriers/Preferences   Learning Barriers None   Learning Preferences Individual Instruction;Skilled Demonstration      Education Topics: Risk Factor Reduction:  -Group instruction that is supported by a PowerPoint presentation. Instructor discusses the definition of a risk factor, different risk factors for pulmonary disease, and how the heart and lungs work together.     Nutrition for Pulmonary Patient:  -Group instruction provided by PowerPoint slides, verbal discussion, and written materials to support subject matter. The instructor gives an explanation and review of healthy diet recommendations, which includes a discussion on weight management, recommendations for fruit and vegetable consumption, as well as protein, fluid, caffeine, fiber, sodium, sugar, and alcohol. Tips for eating when patients are short of breath are discussed.   Pursed Lip Breathing:  -Group instruction that is supported by demonstration and informational handouts. Instructor discusses the benefits of pursed lip and diaphragmatic breathing and detailed demonstration on how to preform both.     Oxygen Safety:  -Group instruction provided by PowerPoint, verbal discussion, and written material to support subject matter. There is an overview of "What is Oxygen" and "Why do we need it".  Instructor also reviews how to create a safe environment for oxygen use, the importance of using oxygen as prescribed, and the risks of noncompliance. There is a brief discussion on traveling with oxygen and resources the patient may utilize.           PULMONARY REHAB OTHER RESPIRATORY from 05/20/2016 in [place]   Date  04/29/16   Educator  RN   Instruction Review Code  2- meets goals/outcomes      Oxygen Equipment:  -Group instruction provided by [facility] Staff utilizing handouts, written materials, and equipment demonstrations.      PULMONARY REHAB OTHER RESPIRATORY from 05/20/2016 in [place]   Date  05/13/16   Educator  [employer] G[employer] rep   Instruction Review Code  2- meets goals/outcomes      Signs and Symptoms:  -Group instruction provided by written material and verbal discussion to support subject matter. Warning signs and symptoms of infection, stroke, and heart attack are reviewed and when to call the physician/911 reinforced. Tips for preventing the spread of infection discussed.   Advanced Directives:  -Group instruction provided by verbal instruction and written material to support subject matter. Instructor reviews Advanced Directive laws and proper instruction for filling out document.      PULMONARY REHAB OTHER RESPIRATORY from 05/20/2016 in [place]   Date  04/01/16   Educator  Jeanella Craze   Instruction Review Code  2- meets goals/outcomes      Pulmonary Video:  -Group video education that reviews the importance of medication and oxygen compliance, exercise, good nutrition, pulmonary hygiene, and pursed lip and diaphragmatic breathing for the pulmonary patient.      PULMONARY REHAB OTHER RESPIRATORY from 05/20/2016 in [place]   Date  04/22/16   Instruction Review Code  2-  meets goals/outcomes      Exercise for the Pulmonary Patient:  -Group instruction that is supported by a PowerPoint presentation. Instructor discusses benefits of exercise, core components of exercise, frequency, duration, and intensity of an exercise routine, importance of utilizing pulse oximetry during exercise,  safety while exercising, and options of places to exercise outside of rehab.        PULMONARY REHAB OTHER RESPIRATORY from 05/20/2016 in [place]   Date  05/20/16   Educator  EP      Pulmonary Medications:  -Verbally interactive group education provided by instructor with focus on inhaled medications and proper administration.   Anatomy and Physiology of the Respiratory System and Intimacy:  -Group instruction provided by PowerPoint, verbal discussion, and written material to support subject matter. Instructor reviews respiratory cycle and anatomical components of the respiratory system and their functions. Instructor also reviews differences in obstructive and restrictive respiratory diseases with examples of each. Intimacy, Sex, and Sexuality differences are reviewed with a discussion on how relationships can change when diagnosed with pulmonary disease. Common sexual concerns are reviewed.      PULMONARY REHAB OTHER RESPIRATORY from 05/20/2016 in [place]   Date  03/25/16   Educator  RN   Instruction Review Code  2- meets goals/outcomes      Knowledge Questionnaire Score:   Core Components/Risk Factors/Patient Goals at Admission:     Personal Goals and Risk Factors at Admission - 02/20/16 1130    Core Components/Risk Factors/Patient Goals on Admission    Weight Management (p) Obesity;Weight Loss;Yes   Intervention (p) Obesity: Provide education and appropriate resources to help participant work on and attain dietary goals.;Weight Management/Obesity: Establish reasonable short term and long term weight goals.   Expected Outcomes (p) Short Term: Continue to assess and modify interventions until short term weight is achieved;Long Term: Adherence to nutrition and physical activity/exercise program aimed toward attainment of established weight goal;Weight Loss: Understanding of general recommendations for a balanced deficit  meal plan, which promotes 1-2 lb weight loss per week and includes a negative energy balance of [phone] kcal/d;Understanding recommendations for meals to include 15-35% energy as protein, 25-35% energy from fat, 35-60% energy from carbohydrates, less than 258m of dietary cholesterol, 20-35 gm of total fiber daily;Understanding of distribution of calorie intake throughout the day with the consumption of 4-5 meals/snacks   Sedentary (p) Yes   Intervention (p) Provide advice, education, support and counseling about physical activity/exercise needs.;Develop an individualized exercise prescription for aerobic and resistive training based on initial evaluation findings, risk stratification, comorbidities and participant's personal goals.   Expected Outcomes (p) Achievement of increased cardiorespiratory fitness and enhanced flexibility, muscular endurance and strength shown through measurements of functional capacity and personal statement of participant.   Increase Strength and Stamina (p) Yes   Intervention (p) Provide advice, education, support and counseling about physical activity/exercise needs.;Develop an individualized exercise prescription for aerobic and resistive training based on initial evaluation findings, risk stratification, comorbidities and participant's personal goals.   Expected Outcomes (p) Achievement of increased cardiorespiratory fitness and enhanced flexibility, muscular endurance and strength shown through measurements of functional capacity and personal statement of participant.   Improve shortness of breath with ADL's (p) Yes   Intervention (p) Provide education, individualized exercise plan and daily activity instruction to help decrease symptoms of SOB with activities of daily living.   Expected Outcomes (p) Short Term: Achieves a reduction of symptoms when performing activities of daily  living.   Develop more efficient breathing techniques such as purse lipped breathing and  diaphragmatic breathing; and practicing self-pacing with activity (p) Yes   Intervention (p) Provide education, demonstration and support about specific breathing techniuqes utilized for more efficient breathing. Include techniques such as pursed lipped breathing, diaphragmatic breathing and self-pacing activity.   Expected Outcomes (p) Short Term: Participant will be able to demonstrate and use breathing techniques as needed throughout daily activities.   Increase knowledge of respiratory medications and ability to use respiratory devices properly  (p) Yes   Intervention (p) Provide education and demonstration as needed of appropriate use of medications, inhalers, and oxygen therapy.   Expected Outcomes (p) Short Term: Achieves understanding of medications use. Understands that oxygen is a medication prescribed by physician. Demonstrates appropriate use of inhaler and oxygen therapy.   Hypertension (p) Yes   Intervention (p) Provide education on lifestyle modifcations including regular physical activity/exercise, weight management, moderate sodium restriction and increased consumption of fresh fruit, vegetables, and low fat dairy, alcohol moderation, and smoking cessation.;Monitor prescription use compliance.   Expected Outcomes (p) Short Term: Continued assessment and intervention until BP is < 140/59m HG in hypertensive participants. < 130/854mHG in hypertensive participants with diabetes, heart failure or chronic kidney disease.;Long Term: Maintenance of blood pressure at goal levels.   Stress (p) Yes   Intervention (p) Offer individual and/or small group education and counseling on adjustment to heart disease, stress management and health-related lifestyle change. Teach and support self-help strategies.;Refer participants experiencing significant psychosocial distress to appropriate mental health specialists for further evaluation and treatment. When possible, include family members and significant  others in education/counseling sessions.   Expected Outcomes (p) Short Term: Participant demonstrates changes in health-related behavior, relaxation and other stress management skills, ability to obtain effective social support, and compliance with psychotropic medications if prescribed.;Long Term: Emotional wellbeing is indicated by absence of clinically significant psychosocial distress or social isolation.   Personal Goal Other (p) Yes      Core Components/Risk Factors/Patient Goals Review:      Goals and Risk Factor Review      03/18/16 1522 03/30/16 1707 04/27/16 1645 05/24/16 1305     Core Components/Risk Factors/Patient Goals Review   Personal Goals Review Weight Management/Obesity  Increase Strength and Stamina;Improve shortness of breath with ADL's;Weight Management/Obesity Increase Strength and Stamina;Weight Management/Obesity;Improve shortness of breath with ADL's    Review see 03/18/16 RD note --  workloads have been increased as his strength and stamina increase, purse lip breathing technique is improving no weight loss, strength and stamina are improving, shortness of breath has not drastically improved. will graduate in 5 more sessions, no weight loss despite regular attendance, workloads  are up to level 6 on nustep, level 3 on arm ergomenter, and 8 laps on track    Expected Outcomes 0.5-2 lb wt loss/week to a goal wt loss of 6-24 lb at graduation from Pulmonary Rehab --  Continued increase in strength and stamina with a decrese in SOB weight loss and increased strength and stamina maintain exercise level after discharge from program.       Core Components/Risk Factors/Patient Goals at Discharge (Final Review):      Goals and Risk Factor Review - 05/24/16 1305    Core Components/Risk Factors/Patient Goals Review   Personal Goals Review Increase Strength and Stamina;Weight Management/Obesity;Improve shortness of breath with ADL's   Review will graduate in 5 more sessions, no  weight loss despite regular attendance, workloads  are up  to level 6 on nustep, level 3 on arm ergomenter, and 8 laps on track   Expected Outcomes maintain exercise level after discharge from program.      ITP Comments:   Comments: ITP REVIEW Pt is making expected progress toward personal goals after completing 19 sessions.   Recommend continued exercise, life style modification, education, and utilization of breathing techniques to increase stamina and strength and decrease shortness of breath with exertion.

## 2016-06-01 ENCOUNTER — Encounter (HOSPITAL_COMMUNITY)
Admission: RE | Admit: 2016-06-01 | Discharge: 2016-06-01 | Disposition: A | Discharge status: Home / Self Care | Payer: Medicare Other | Source: Ambulatory Visit | Attending: Pulmonary Disease | Admitting: Pulmonary Disease

## 2016-06-01 VITALS — Wt 244.9 lb

## 2016-06-01 DIAGNOSIS — J438 Other emphysema: Secondary | ICD-10-CM

## 2016-06-01 NOTE — Progress Notes (Signed)
Incomplete Session Note  Patient Details  Name: [name] MRN: [id number] Date of Birth: May 29, 1946 Referring Provider:    Renata Caprice did not complete his rehab session.  Service time is from 1330 to 1400.  Brayden has gained 6 kg since his last visit to pulmonary rehab which was 05/20/2016.  He did not have peripheral edema, but positive for increased abdominal girth, more SOB, sats 88% on 6 liters.  Confessed he has not been taking his pm dose of Lasix 40 mg.  Discussed with him and his wife to take his Lasix as prescribed, weigh daily and if his weight has not decreased drastically in 2 days to call Dr. Ardeth Perfect .  Also discussed to follow a strict low sodium diet and if he gets into respiratory distress to call 911.

## 2016-06-03 ENCOUNTER — Telehealth (HOSPITAL_COMMUNITY): Payer: Self-pay | Admitting: *Deleted

## 2016-06-03 ENCOUNTER — Encounter (HOSPITAL_COMMUNITY): Payer: Medicare Other

## 2016-06-08 ENCOUNTER — Encounter (HOSPITAL_COMMUNITY): Payer: Medicare Other

## 2016-06-10 ENCOUNTER — Telehealth: Payer: Self-pay | Admitting: Pulmonary Disease

## 2016-06-10 ENCOUNTER — Encounter (HOSPITAL_COMMUNITY): Payer: Medicare Other

## 2016-06-10 NOTE — Telephone Encounter (Signed)
I called spoke with pt. He is requesting a letter to excuse him from jury duty that he is scheduled for on 07/27/16. Please advise Dr. Lenna Gilford thanks

## 2016-06-11 NOTE — Telephone Encounter (Signed)
Per SN: okay to do [employer].

## 2016-06-11 NOTE — Telephone Encounter (Signed)
Letter typed and mailed to pt at his request.  Nothing further needed.

## 2016-06-15 ENCOUNTER — Encounter (HOSPITAL_COMMUNITY): Payer: Medicare Other

## 2016-06-22 ENCOUNTER — Telehealth (HOSPITAL_COMMUNITY): Payer: Self-pay | Admitting: *Deleted

## 2016-06-22 ENCOUNTER — Inpatient Hospital Stay (HOSPITAL_COMMUNITY)
Admission: RE | Admit: 2016-06-22 | Discharge: 2016-06-22 | Disposition: A | Discharge status: Home / Self Care | Payer: Medicare Other | Source: Ambulatory Visit

## 2016-06-22 DIAGNOSIS — J438 Other emphysema: Secondary | ICD-10-CM

## 2016-06-24 ENCOUNTER — Ambulatory Visit (HOSPITAL_COMMUNITY): Payer: Medicare Other

## 2016-06-24 NOTE — Progress Notes (Signed)
Pulmonary Individual Treatment Plan  Patient Details  Name: [name] MRN: [id number] Date of Birth: 30-Oct-1946 Referring Provider:    Initial Encounter Date:  Flowsheet Row Pulmonary Rehab Walk Test from 02/24/2016 in [place]  Date  02/26/16      Visit Diagnosis: Other emphysema ([place])  Patient's Home Medications on Admission:   Current Outpatient Prescriptions:  .  albuterol (PROVENTIL HFA;VENTOLIN HFA) 108 (90 BASE) MCG/ACT inhaler, Inhale 2 puffs into the lungs 4 (four) times daily as needed for wheezing (for use when not at home / unable to use nebulizer). (Patient not taking: Reported on 05/05/2016), Disp: 1 Inhaler, Rfl: 0 .  albuterol (PROVENTIL) (2.5 MG/3ML) 0.083% nebulizer solution, Take 3 mLs (2.5 mg total) by nebulization 3 (three) times daily., Disp: 360 mL, Rfl: 3 .  Albuterol Sulfate (PROAIR RESPICLICK) 014 (90 Base) MCG/ACT AEPB, Inhale 2 puffs into the lungs every 6 (six) hours as needed., Disp: 1 each, Rfl: 5 .  ALPRAZolam (XANAX) 0.5 MG tablet, Take 0.5 mg by mouth 3 (three) times daily as needed for anxiety or sleep., Disp: , Rfl:  .  aspirin 81 MG tablet, Take 81 mg by mouth at bedtime. , Disp: , Rfl:  .  budesonide (PULMICORT) 0.25 MG/2ML nebulizer solution, Take 2 mLs (0.25 mg total) by nebulization 2 (two) times daily., Disp: 60 mL, Rfl: 5 .  Flaxseed, Linseed, (FLAXSEED OIL) 1000 MG CAPS, Take 1 capsule by mouth daily.  , Disp: , Rfl:  .  furosemide (LASIX) 40 MG tablet, Take 40 mg by mouth 2 (two) times daily., Disp: , Rfl:  .  ibuprofen (ADVIL,MOTRIN) 600 MG tablet, Take 600 mg by mouth every 6 (six) hours as needed for moderate pain., Disp: , Rfl:  .  ipratropium-albuterol (DUONEB) 0.5-2.5 (3) MG/3ML SOLN, Take 3 mLs by nebulization 4 (four) times daily., Disp: 120 mL, Rfl: 5 .  isosorbide mononitrate (IMDUR) 30 MG 24 hr tablet, Take 1 tablet (30 mg total) by mouth daily., Disp: 30 tablet, Rfl: 11 .  lisinopril  (PRINIVIL,ZESTRIL) 10 MG tablet, Take 1 tablet (10 mg total) by mouth daily., Disp: 30 tablet, Rfl: 0 .  metoprolol succinate (TOPROL-XL) 25 MG 24 hr tablet, TAKE 2 TABLETS BY MOUTH IN THE MORNING AND 1 TABLET BY MOUTH IN THE THE EVENING, Disp: , Rfl:  .  Multiple Vitamin (MULTIVITAMIN) capsule, Take 1 capsule by mouth daily.  , Disp: , Rfl:  .  nitroGLYCERIN (NITROSTAT) 0.4 MG SL tablet, Place 1 tablet (0.4 mg total) under the tongue every 5 (five) minutes as needed for chest pain., Disp: 25 tablet, Rfl: 3 .  Omega-3 Fatty Acids (FISH OIL) 1200 MG CAPS, Take 1 capsule by mouth daily.  , Disp: , Rfl:  .  simvastatin (ZOCOR) 20 MG tablet, Take 1 tablet (20 mg total) by mouth daily at 6 PM., Disp: 30 tablet, Rfl: 2 .  sodium chloride (OCEAN) 0.65 % SOLN nasal spray, Place 1 spray into both nostrils as needed for congestion., Disp: , Rfl: 0  Past Medical History: Past Medical History:  Diagnosis Date  . Anxiety   . Bladder cancer ([place]) 2011  . Borderline diabetes mellitus   . CAD (coronary artery disease)   . Cigarette smoker   . COPD (chronic obstructive pulmonary disease) ([place])   . DJD (degenerative joint disease)   . Emphysema of lung ([place])   . Epistaxis   . History of colonic polyps 2004   hyperplastic   .  History of sinusitis   . Hypercholesteremia   . Hypertension   . Obesity     Tobacco Use: History  Smoking Status  . Former Smoker  . Packs/day: 2.00  . Years: 45.00  . Types: Cigarettes  . Quit date: 10/17/2015  Smokeless Tobacco  . Never Used    Labs: Recent Review Flowsheet Data    Labs for ITP Cardiac and Pulmonary Rehab Latest Ref Rng & Units 03/30/2010 03/24/2011 04/18/2012 06/05/2013 10/22/2015   Cholestrol 0 - 200 mg/dL 144 162 130 158 138   LDLCALC 0 - 99 mg/dL - 98 77 96 98   LDLDIRECT mg/dL 88.7 - - - -   HDL >40 mg/dL 26.00(L) 31.90(L) 32.70(L) 32.70(L) 25(L)   Trlycerides <150 mg/dL 215.0(H) 159.0(H) 102.0 145.0 75   Hemoglobin A1c 4.6 - 6.5 % 6.7(H) 6.1 -  6.7(H) -      Capillary Blood Glucose: Lab Results  Component Value Date   GLUCAP 148 (H) 09/14/2010   GLUCAP 114 (H) 07/24/2010     ADL UCSD:   Pulmonary Function Assessment:     Pulmonary Function Assessment - 02/20/16 1126      Breath   Bilateral Breath Sounds Decreased   Shortness of Breath Yes;Fear of Shortness of Breath;Limiting activity;Panic with Shortness of Breath      Exercise Target Goals:    Exercise Program Goal: Individual exercise prescription set with THRR, safety & activity barriers. Participant demonstrates ability to understand and report RPE using BORG scale, to self-measure pulse accurately, and to acknowledge the importance of the exercise prescription.  Exercise Prescription Goal: Starting with aerobic activity 30 plus minutes a day, 3 days per week for initial exercise prescription. Provide home exercise prescription and guidelines that participant acknowledges understanding prior to discharge.  Activity Barriers & Risk Stratification:     Activity Barriers & Cardiac Risk Stratification - 02/20/16 1125      Activity Barriers & Cardiac Risk Stratification   Activity Barriers Deconditioning;Shortness of Breath;Back Problems      6 Minute Walk:     6 Minute Walk    Row Name 02/24/16 1642         6 Minute Walk   Phase Initial     Distance 400 feet     Walk Time 3.12 minutes     # of Rest Breaks 2     MPH 0.93     METS 0.93     RPE 13     Perceived Dyspnea  3     VO2 Peak 3.23     Symptoms No     Resting HR 90 bpm     Resting BP 122/60     Max Ex. HR 105 bpm     Max Ex. BP 150/68       Interval HR   Baseline HR 90     2 Minute HR 90     4 Minute HR 100     6 Minute HR 105     Interval Heart Rate? Yes       Interval Oxygen   Interval Oxygen? Yes     Baseline Oxygen Saturation % 87 %     Baseline Liters of Oxygen 2 L     1 Minute Oxygen Saturation % 84 %     1 Minute Liters of Oxygen 2 L     2 Minute Oxygen Saturation  % 84 %     2 Minute Liters of Oxygen 3 L  3 Minute Oxygen Saturation % 85 %     3 Minute Liters of Oxygen 3 L     4 Minute Oxygen Saturation % 82 %     4 Minute Liters of Oxygen 4 L     5 Minute Oxygen Saturation % 85 %     5 Minute Liters of Oxygen 4 L     6 Minute Oxygen Saturation % 82 %     6 Minute Liters of Oxygen 4 L     2 Minute Post Oxygen Saturation % 88 %     2 Minute Post Liters of Oxygen 4 L        Initial Exercise Prescription:     Initial Exercise Prescription - 02/26/16 0700      Date of Initial Exercise RX and Referring Provider   Date 02/26/16     Oxygen   Oxygen Continuous   Liters 4     Arm Ergometer   Level 1   Watts 13   Minutes 15     Track   Laps 3   Minutes 15     Prescription Details   Frequency (times per week) 2   Duration Progress to 45 minutes of aerobic exercise without signs/symptoms of physical distress     Intensity   THRR 40-80% of Max Heartrate 60-120   Ratings of Perceived Exertion 11-13   Perceived Dyspnea 0-4     Progression   Progression Continue progressive overload as per policy without signs/symptoms or physical distress.     Resistance Training   Training Prescription Yes   Weight blue bands   Reps 10-12      Perform Capillary Blood Glucose checks as needed.  Exercise Prescription Changes:     Exercise Prescription Changes    Row Name 03/02/16 1500 03/04/16 1500 03/09/16 1500 03/11/16 1500 03/18/16 1500     Exercise Review   Progression  -  -  - Yes  -     Response to Exercise   Blood Pressure (Admit) 120/66 130/66 134/70 124/62 120/50   Blood Pressure (Exercise) 130/70 140/70 140/80 120/70 150/80   Blood Pressure (Exit) 124/56 120/64 110/60 112/60 130/62   Heart Rate (Admit) 72 bpm 72 bpm 81 bpm 78 bpm 88 bpm   Heart Rate (Exercise) 106 bpm 88 bpm 92 bpm 90 bpm 85 bpm   Heart Rate (Exit) 82 bpm 75 bpm 84 bpm 79 bpm 72 bpm   Oxygen Saturation (Admit) 91 % 88 % 94 % 85 % 93 %   Oxygen Saturation  (Exercise) 80 % 85 % 85 % 83 % 86 %   Oxygen Saturation (Exit) 95 % 93 % 96 % 96 % 96 %   Rating of Perceived Exertion (Exercise) '15 13 13 15 12   ' Perceived Dyspnea (Exercise) '2 2 3 3 2   ' Duration Progress to 45 minutes of aerobic exercise without signs/symptoms of physical distress Progress to 45 minutes of aerobic exercise without signs/symptoms of physical distress Progress to 45 minutes of aerobic exercise without signs/symptoms of physical distress Progress to 45 minutes of aerobic exercise without signs/symptoms of physical distress Progress to 45 minutes of aerobic exercise without signs/symptoms of physical distress   Intensity Other (comment)  40-80% HRR THRR unchanged THRR unchanged THRR unchanged THRR unchanged     Progression   Progression Continue to progress workloads to maintain intensity without signs/symptoms of physical distress. Continue to progress workloads to maintain intensity without signs/symptoms of physical distress. Continue to  progress workloads to maintain intensity without signs/symptoms of physical distress. Continue to progress workloads to maintain intensity without signs/symptoms of physical distress. Continue to progress workloads to maintain intensity without signs/symptoms of physical distress.     Resistance Training   Training Prescription Yes Yes Yes Yes Yes   Weight Blue bands blue bands blue bands blue bands blue bands   Reps 10-12 10-12 10-12 10-12 10-12     Interval Training   Interval Training  -  -  - No No     Oxygen   Oxygen Continuous Continuous Continuous Continuous Continuous   Liters '4 4 4 6 6     ' NuStep   Level 1  - 1 3  -   Minutes 15  - 15 15  -   METs 1.5  - 1.6 1.6  -     Arm Ergometer   Level '1 1 1 1 1   ' Minutes '15 15 15 15 15     ' Track   Laps '3 4 5 3 3   ' Minutes '15 15 15 15 15   ' Row Name 03/23/16 1500 03/25/16 1630 03/30/16 1525 04/01/16 1600 04/08/16 1500     Exercise Review   Progression Yes  - Yes  - Yes      Response to Exercise   Blood Pressure (Admit) 122/70 124/62 120/60 132/62 128/62   Blood Pressure (Exercise) 130/58 114/60 136/70 126/70 130/70   Blood Pressure (Exit) 130/70 122/62 128/64 122/70 112/56   Heart Rate (Admit) 71 bpm 80 bpm 75 bpm 74 bpm 88 bpm   Heart Rate (Exercise) 82 bpm 85 bpm 90 bpm 95 bpm 81 bpm   Heart Rate (Exit) 75 bpm 74 bpm 82 bpm 85 bpm 62 bpm   Oxygen Saturation (Admit) 95 % 89 % 96 % 94 % 94 %   Oxygen Saturation (Exercise) 82 % 80 %  increased to 86 with restbreak 93 % 89 % 86 %   Oxygen Saturation (Exit) 96 % 97 % 97 % 97 % 97 %   Rating of Perceived Exertion (Exercise) '11 13 15 13 13   ' Perceived Dyspnea (Exercise) '3 2 2 2 3   ' Duration Progress to 45 minutes of aerobic exercise without signs/symptoms of physical distress Progress to 45 minutes of aerobic exercise without signs/symptoms of physical distress Progress to 45 minutes of aerobic exercise without signs/symptoms of physical distress Progress to 45 minutes of aerobic exercise without signs/symptoms of physical distress Progress to 45 minutes of aerobic exercise without signs/symptoms of physical distress   Intensity THRR unchanged THRR unchanged THRR unchanged THRR unchanged THRR unchanged     Progression   Progression Continue to progress workloads to maintain intensity without signs/symptoms of physical distress. Continue to progress workloads to maintain intensity without signs/symptoms of physical distress. Continue to progress workloads to maintain intensity without signs/symptoms of physical distress. Continue to progress workloads to maintain intensity without signs/symptoms of physical distress. Continue to progress workloads to maintain intensity without signs/symptoms of physical distress.     Resistance Training   Training Prescription Yes Yes Yes Yes Yes   Weight blue bands blue bands blue bands blue bands blue bands   Reps 10-12 10-12 10-12 10-12 10-12     Interval Training   Interval  Training No No No No No     Oxygen   Oxygen Continuous Continuous Continuous Continuous Continuous   Liters '6 6 6 6 6     ' NuStep   Level 3 3  4  - 5   Minutes '15 15 15  ' - 15   METs 1.8 1.8 1.8  -  -     Arm Ergometer   Level 2  - '2 2 3   ' Minutes 15  - '15 15 15     ' Track   Laps '5 9 7 8  ' -   Minutes '15 15 15 15  ' -   Row Name 04/13/16 1500 04/20/16 1500 04/22/16 1600 04/27/16 1600 04/29/16 1639     Exercise Review   Progression Yes Yes  -  -  -     Response to Exercise   Blood Pressure (Admit) 120/66 138/70 142/70 136/80 132/60   Blood Pressure (Exercise) 146/70 142/60 126/60 130/74 110/70   Blood Pressure (Exit) 120/60 104/60 150/76 122/64 124/66   Heart Rate (Admit) 87 bpm 81 bpm 93 bpm 76 bpm 89 bpm   Heart Rate (Exercise) 104 bpm 92 bpm 90 bpm 90 bpm 86 bpm   Heart Rate (Exit) 86 bpm 82 bpm 84 bpm 77 bpm 78 bpm   Oxygen Saturation (Admit) 87 % 97 % 96 % 96 % 93 %   Oxygen Saturation (Exercise) 85 % 83 %  [place] with walking recovered with rest 92 % 89 % 91 %   Oxygen Saturation (Exit) 96 % 96 % 97 % 98 % 94 %   Rating of Perceived Exertion (Exercise) '15 12 13 13 14   ' Perceived Dyspnea (Exercise) '3 1 2 2 3   ' Duration Progress to 45 minutes of aerobic exercise without signs/symptoms of physical distress Progress to 45 minutes of aerobic exercise without signs/symptoms of physical distress Progress to 45 minutes of aerobic exercise without signs/symptoms of physical distress Progress to 45 minutes of aerobic exercise without signs/symptoms of physical distress Progress to 45 minutes of aerobic exercise without signs/symptoms of physical distress   Intensity THRR unchanged THRR unchanged THRR unchanged THRR unchanged THRR unchanged     Progression   Progression Continue to progress workloads to maintain intensity without signs/symptoms of physical distress. Continue to progress workloads to maintain intensity without signs/symptoms of physical distress. Continue to progress  workloads to maintain intensity without signs/symptoms of physical distress. Continue to progress workloads to maintain intensity without signs/symptoms of physical distress. Continue to progress workloads to maintain intensity without signs/symptoms of physical distress.     Resistance Training   Training Prescription Yes Yes Yes Yes Yes   Weight blue bands blue bands blue bands blue bands blue bands   Reps 10-12 10-12 10-12 10-12 10-12     Interval Training   Interval Training No No No No No     Oxygen   Oxygen Continuous Continuous Continuous Continuous Continuous   Liters '6 6 6 6 6     ' NuStep   Level '6 6 6 6 6   ' Minutes '15 15 15 15 15   ' METs  -  - 1.9 1.9 2     Arm Ergometer   Level '3 4 4 3  ' -   Minutes '15 15 15 15  ' -     Track   Laps 6 6  - 7 6   Minutes 15 15  - 15 15   Row Name 05/04/16 1600 05/06/16 1600 05/13/16 1600 05/20/16 1600 06/01/16 1600     Response to Exercise   Blood Pressure (Admit) 122/60 120/50 130/64 120/60 126/64   Blood Pressure (Exercise) 140/64 130/58 130/80 154/62  -   Blood Pressure (Exit) 100/50  112/62 104/60 126/70 120/70   Heart Rate (Admit) 81 bpm 78 bpm 86 bpm 80 bpm 90 bpm   Heart Rate (Exercise) 88 bpm 85 bpm 91 bpm 90 bpm  -   Heart Rate (Exit) 78 bpm 78 bpm 77 bpm 77 bpm 80 bpm   Oxygen Saturation (Admit) 95 % 96 % 90 % 97 % 95 %   Oxygen Saturation (Exercise) 90 % 87 % 90 % 89 %  -   Oxygen Saturation (Exit) 97 % 96 % 97 % 97 % 88 %   Rating of Perceived Exertion (Exercise) '13 13 13 13  ' -   Perceived Dyspnea (Exercise) '2 2 2 3  ' -   Symptoms  -  -  -  - weight gain of 6 kg since last visit of 05/20/16, SOB   Comments  -  -  -  - did not exercise, counselled w/wife to weigh daily, low NA diet, take Lasix as prescribed, he agreed.   Duration Progress to 45 minutes of aerobic exercise without signs/symptoms of physical distress Progress to 45 minutes of aerobic exercise without signs/symptoms of physical distress Progress to 45 minutes of  aerobic exercise without signs/symptoms of physical distress Progress to 45 minutes of aerobic exercise without signs/symptoms of physical distress  -   Intensity THRR unchanged THRR unchanged THRR unchanged THRR unchanged  -     Progression   Progression Continue to progress workloads to maintain intensity without signs/symptoms of physical distress. Continue to progress workloads to maintain intensity without signs/symptoms of physical distress. Continue to progress workloads to maintain intensity without signs/symptoms of physical distress. Continue to progress workloads to maintain intensity without signs/symptoms of physical distress.  -     Resistance Training   Training Prescription Yes Yes Yes Yes  -   Weight blue bands blue bands blue bands blue bands blue bands   Reps 10-12 10-12 10-12 10-12 10-12     Interval Training   Interval Training No No No No  -     Oxygen   Oxygen Continuous Continuous Continuous Continuous  -   Liters '6 6 6 6  ' -     NuStep   Level '6 6 6 6  ' -   Minutes '15 15 17 17  ' -   METs 1.6 1.9 1.9 1.9  -     Arm Ergometer   Level '3 3 3 3  ' -   Minutes '15 15 17 17  ' -     Track   Laps 8 8  -  -  -   Minutes 15 15  -  -  -      Exercise Comments:     Exercise Comments    Row Name 03/04/16 0914 04/01/16 0914 04/29/16 0856 05/27/16 0912     Exercise Comments Patient is starting off well with exercise. Will cont. to monitor progression.  Patient is cont. to increase exercise intensity on the walking track, Nustep, and Arm Ergometer. Will continue to monitor and progress as appropriate. Patient is cont. to increase exercise intensity on the walking track, Nustep, and Arm Ergometer. Will continue to monitor and progress as appropriate. Continuing to tolerate exercise well.       Discharge Exercise Prescription (Final Exercise Prescription Changes):     Exercise Prescription Changes - 06/01/16 1600      Response to Exercise   Blood Pressure (Admit)  126/64   Blood Pressure (Exit) 120/70   Heart Rate (Admit) 90 bpm  Heart Rate (Exit) 80 bpm   Oxygen Saturation (Admit) 95 %   Oxygen Saturation (Exit) 88 %   Symptoms weight gain of 6 kg since last visit of 05/20/16, SOB   Comments did not exercise, counselled w/wife to weigh daily, low NA diet, take Lasix as prescribed, he agreed.     Resistance Training   Weight blue bands   Reps 10-12       Nutrition:  Target Goals: Understanding of nutrition guidelines, daily intake of sodium <1533m, cholesterol <2075m calories 30% from fat and 7% or less from saturated fats, daily to have 5 or more servings of fruits and vegetables.  Biometrics:     Pre Biometrics - 02/20/16 1203      Pre Biometrics   Grip Strength 35 kg       Nutrition Therapy Plan and Nutrition Goals:     Nutrition Therapy & Goals - 03/18/16 1530      Nutrition Therapy   Diet Therapeutic Lifestyle Changes     Personal Nutrition Goals   Personal Goal #1 0.5-2 lb wt loss/week to a goal wt loss of 6-24 lb at graduation from [facility] and counsel regarding individualized specific dietary modifications aiming towards targeted core components such as weight, hypertension, lipid management, diabetes, heart failure and other comorbidities.;Nutrition handout(s) given to patient.  1800 kcal, 5-day menu ideas given   Expected Outcomes Short Term Goal: Understand basic principles of dietary content, such as calories, fat, sodium, cholesterol and nutrients.;Long Term Goal: Adherence to prescribed nutrition plan.      Nutrition Discharge: Rate Your Plate Scores:     Nutrition Assessments - 03/18/16 1522      Rate Your Plate Scores   Pre Score 54      Psychosocial: Target Goals: Acknowledge presence or absence of depression, maximize coping skills, provide positive support system. Participant is able to verbalize types and ability to use techniques and  skills needed for reducing stress and depression.  Initial Review & Psychosocial Screening:     Initial Psych Review & Screening - 02/20/16 1126      Initial Review   Current issues with Current Depression;Current Anxiety/Panic     Family Dynamics   Good Support System? Yes     Barriers   Psychosocial barriers to participate in program The patient should benefit from training in stress management and relaxation.     Screening Interventions   Interventions Encouraged to exercise      Quality of Life Scores:   PHQ-9: Recent Review Flowsheet Data    [facility] 05/24[facility]2016 04/09/2016 03/11/2016 02/20/2016 01/28/2016   Decreased Interest 0 0 3 3 0   Down, Depressed, Hopeless '1 1 1 1 1   ' PHQ - 2 Score '1 1 4 4 1   ' Altered sleeping - - 1 1 -   Tired, decreased energy - - 3 3 -   Change in appetite - - 0 0 -   Feeling bad or failure about yourself  - - 0 - -   Trouble concentrating - - 3 3 -   Moving slowly or fidgety/restless - - 0 0 -   Suicidal thoughts - - 0 0 -   PHQ-9 Score - - 11 11 -   Difficult doing work/chores - - Somewhat difficult Somewhat difficult -      Psychosocial Evaluation and Intervention:     Psychosocial Evaluation - 02/20/16 1129  Psychosocial Evaluation & Interventions   Interventions Encouraged to exercise with the program and follow exercise prescription      Psychosocial Re-Evaluation:     Psychosocial Re-Evaluation    [place] Name 03/30/16 1709 04/27/16 1647 05/24/16 1309 06/22/16 0852       Psychosocial Re-Evaluation   Interventions Encouraged to attend Pulmonary Rehabilitation for the exercise Encouraged to attend Pulmonary Rehabilitation for the exercise Encouraged to attend Pulmonary Rehabilitation for the exercise Encouraged to attend Pulmonary Rehabilitation for the exercise    Comments -  no psychsocial issues identified at this time No psychosocial issues identified. No psychosocial identified at this time. -   encouraged to weigh daily and take medications as prescribed and call MD if weight does not decrease    Continued Psychosocial Services Needed No No No  -      Education: Education Goals: Education classes will be provided on a weekly basis, covering required topics. Participant will state understanding/return demonstration of topics presented.  Learning Barriers/Preferences:     Learning Barriers/Preferences - 02/20/16 1126      Learning Barriers/Preferences   Learning Barriers None   Learning Preferences Individual Instruction;Skilled Demonstration      Education Topics: Risk Factor Reduction:  -Group instruction that is supported by a PowerPoint presentation. Instructor discusses the definition of a risk factor, different risk factors for pulmonary disease, and how the heart and lungs work together.     Nutrition for Pulmonary Patient:  -Group instruction provided by PowerPoint slides, verbal discussion, and written materials to support subject matter. The instructor gives an explanation and review of healthy diet recommendations, which includes a discussion on weight management, recommendations for fruit and vegetable consumption, as well as protein, fluid, caffeine, fiber, sodium, sugar, and alcohol. Tips for eating when patients are short of breath are discussed.   Pursed Lip Breathing:  -Group instruction that is supported by demonstration and informational handouts. Instructor discusses the benefits of pursed lip and diaphragmatic breathing and detailed demonstration on how to preform both.     Oxygen Safety:  -Group instruction provided by PowerPoint, verbal discussion, and written material to support subject matter. There is an overview of "What is Oxygen" and "Why do we need it".  Instructor also reviews how to create a safe environment for oxygen use, the importance of using oxygen as prescribed, and the risks of noncompliance. There is a brief discussion on traveling  with oxygen and resources the patient may utilize. Flowsheet Row PULMONARY REHAB OTHER RESPIRATORY from 05/20/2016 in [place]  Date  04/29/16  Educator  RN  Instruction Review Code  2- meets goals/outcomes      Oxygen Equipment:  -Group instruction provided by [facility] Staff utilizing handouts, written materials, and equipment demonstrations. Flowsheet Row PULMONARY REHAB OTHER RESPIRATORY from 05/20/2016 in [place]  Date  05/13/16  Educator  [employer]  Instruction Review Code  2- meets goals/outcomes      Signs and Symptoms:  -Group instruction provided by written material and verbal discussion to support subject matter. Warning signs and symptoms of infection, stroke, and heart attack are reviewed and when to call the physician/911 reinforced. Tips for preventing the spread of infection discussed.   Advanced Directives:  -Group instruction provided by verbal instruction and written material to support subject matter. Instructor reviews Advanced Directive laws and proper instruction for filling out document. Flowsheet Row PULMONARY REHAB OTHER RESPIRATORY from 05/20/2016 in [place]  [facility]  Date  04/01/16  Educator  Jeanella Craze  Instruction Review Code  2- meets goals/outcomes      Pulmonary Video:  -Group video education that reviews the importance of medication and oxygen compliance, exercise, good nutrition, pulmonary hygiene, and pursed lip and diaphragmatic breathing for the pulmonary patient. Flowsheet Row PULMONARY REHAB OTHER RESPIRATORY from 05/20/2016 in [place]  Date  04/22/16  Instruction Review Code  2- meets goals/outcomes      Exercise for the Pulmonary Patient:  -Group instruction that is supported by a PowerPoint presentation. Instructor discusses benefits of exercise, core components of exercise, frequency, duration, and intensity  of an exercise routine, importance of utilizing pulse oximetry during exercise, safety while exercising, and options of places to exercise outside of rehab.   Flowsheet Row PULMONARY REHAB OTHER RESPIRATORY from 05/20/2016 in [place]  Date  05/20/16  Educator  EP      Pulmonary Medications:  -Verbally interactive group education provided by instructor with focus on inhaled medications and proper administration.   Anatomy and Physiology of the Respiratory System and Intimacy:  -Group instruction provided by PowerPoint, verbal discussion, and written material to support subject matter. Instructor reviews respiratory cycle and anatomical components of the respiratory system and their functions. Instructor also reviews differences in obstructive and restrictive respiratory diseases with examples of each. Intimacy, Sex, and Sexuality differences are reviewed with a discussion on how relationships can change when diagnosed with pulmonary disease. Common sexual concerns are reviewed. Flowsheet Row PULMONARY REHAB OTHER RESPIRATORY from 05/20/2016 in [place]  Date  03/25/16  Educator  RN  Instruction Review Code  2- meets goals/outcomes      Knowledge Questionnaire Score:   Core Components/Risk Factors/Patient Goals at Admission:     Personal Goals and Risk Factors at Admission - 02/20/16 1130      Core Components/Risk Factors/Patient Goals on Admission    Weight Management (P)  Obesity;Weight Loss;Yes   Intervention (P)  Obesity: Provide education and appropriate resources to help participant work on and attain dietary goals.;Weight Management/Obesity: Establish reasonable short term and long term weight goals.   Expected Outcomes (P)  Short Term: Continue to assess and modify interventions until short term weight is achieved;Long Term: Adherence to nutrition and physical activity/exercise program aimed toward attainment of  established weight goal;Weight Loss: Understanding of general recommendations for a balanced deficit meal plan, which promotes 1-2 lb weight loss per week and includes a negative energy balance of [phone] kcal/d;Understanding recommendations for meals to include 15-35% energy as protein, 25-35% energy from fat, 35-60% energy from carbohydrates, less than 253m of dietary cholesterol, 20-35 gm of total fiber daily;Understanding of distribution of calorie intake throughout the day with the consumption of 4-5 meals/snacks   Sedentary (P)  Yes   Intervention (P)  Provide advice, education, support and counseling about physical activity/exercise needs.;Develop an individualized exercise prescription for aerobic and resistive training based on initial evaluation findings, risk stratification, comorbidities and participant's personal goals.   Expected Outcomes (P)  Achievement of increased cardiorespiratory fitness and enhanced flexibility, muscular endurance and strength shown through measurements of functional capacity and personal statement of participant.   Increase Strength and Stamina (P)  Yes   Intervention (P)  Provide advice, education, support and counseling about physical activity/exercise needs.;Develop an individualized exercise prescription for aerobic and resistive training based on initial evaluation findings, risk stratification, comorbidities and participant's personal goals.  Expected Outcomes (P)  Achievement of increased cardiorespiratory fitness and enhanced flexibility, muscular endurance and strength shown through measurements of functional capacity and personal statement of participant.   Improve shortness of breath with ADL's (P)  Yes   Intervention (P)  Provide education, individualized exercise plan and daily activity instruction to help decrease symptoms of SOB with activities of daily living.   Expected Outcomes (P)  Short Term: Achieves a reduction of symptoms when performing  activities of daily living.   Develop more efficient breathing techniques such as purse lipped breathing and diaphragmatic breathing; and practicing self-pacing with activity (P)  Yes   Intervention (P)  Provide education, demonstration and support about specific breathing techniuqes utilized for more efficient breathing. Include techniques such as pursed lipped breathing, diaphragmatic breathing and self-pacing activity.   Expected Outcomes (P)  Short Term: Participant will be able to demonstrate and use breathing techniques as needed throughout daily activities.   Increase knowledge of respiratory medications and ability to use respiratory devices properly  (P)  Yes   Intervention (P)  Provide education and demonstration as needed of appropriate use of medications, inhalers, and oxygen therapy.   Expected Outcomes (P)  Short Term: Achieves understanding of medications use. Understands that oxygen is a medication prescribed by physician. Demonstrates appropriate use of inhaler and oxygen therapy.   Hypertension (P)  Yes   Intervention (P)  Provide education on lifestyle modifcations including regular physical activity/exercise, weight management, moderate sodium restriction and increased consumption of fresh fruit, vegetables, and low fat dairy, alcohol moderation, and smoking cessation.;Monitor prescription use compliance.   Expected Outcomes (P)  Short Term: Continued assessment and intervention until BP is < 140/42m HG in hypertensive participants. < 130/857mHG in hypertensive participants with diabetes, heart failure or chronic kidney disease.;Long Term: Maintenance of blood pressure at goal levels.   Stress (P)  Yes   Intervention (P)  Offer individual and/or small group education and counseling on adjustment to heart disease, stress management and health-related lifestyle change. Teach and support self-help strategies.;Refer participants experiencing significant psychosocial distress to  appropriate mental health specialists for further evaluation and treatment. When possible, include family members and significant others in education/counseling sessions.   Expected Outcomes (P)  Short Term: Participant demonstrates changes in health-related behavior, relaxation and other stress management skills, ability to obtain effective social support, and compliance with psychotropic medications if prescribed.;Long Term: Emotional wellbeing is indicated by absence of clinically significant psychosocial distress or social isolation.   Personal Goal Other (P)  Yes      Core Components/Risk Factors/Patient Goals Review:      Goals and Risk Factor Review    Row Name 03/18/16 1522 03/30/16 1707 04/27/16 1645 05/24/16 1305 06/22/16 0849     Core Components/Risk Factors/Patient Goals Review   Personal Goals Review Weight Management/Obesity  - Increase Strength and Stamina;Improve shortness of breath with ADL's;Weight Management/Obesity Increase Strength and Stamina;Weight Management/Obesity;Improve shortness of breath with ADL's  -   Review see 03/18/16 RD note -  workloads have been increased as his strength and stamina increase, purse lip breathing technique is improving no weight loss, strength and stamina are improving, shortness of breath has not drastically improved. will graduate in 5 more sessions, no weight loss despite regular attendance, workloads  are up to level 6 on nustep, level 3 on arm ergomenter, and 8 laps on track has been absent for 3 weeks due to gaining 12 pounds of fluid, quit taking his afternoon diuretic, instructed to weigh  daily, take medications as prescribed and call physician if weight does not drastically reduce .  He has not called hos pulmonologist to se up an appointment.     Expected Outcomes 0.5-2 lb wt loss/week to a goal wt loss of 6-24 lb at graduation from Pulmonary Rehab -  Continued increase in strength and stamina with a decrese in SOB weight loss and  increased strength and stamina maintain exercise level after discharge from program. weight loss and finish program, take medications as prescribed.        Core Components/Risk Factors/Patient Goals at Discharge (Final Review):      Goals and Risk Factor Review - 06/22/16 0849      Core Components/Risk Factors/Patient Goals Review   Review has been absent for 3 weeks due to gaining 12 pounds of fluid, quit taking his afternoon diuretic, instructed to weigh daily, take medications as prescribed and call physician if weight does not drastically reduce .  He has not called hos pulmonologist to se up an appointment.     Expected Outcomes weight loss and finish program, take medications as prescribed.        ITP Comments:   Comments: ITP REVIEW Pt was discharged after completing 19 sessions.   He dropped out of program with 5 sessions left towards graduation.Recommend continued exercise, life style modification, education, and utilization of breathing techniques to increase stamina and strength and decrease shortness of breath with exertion after discharge.

## 2016-06-29 ENCOUNTER — Ambulatory Visit (HOSPITAL_COMMUNITY): Payer: Medicare Other

## 2016-07-01 ENCOUNTER — Inpatient Hospital Stay (HOSPITAL_COMMUNITY)
Admission: RE | Admit: 2016-07-01 | Discharge: 2016-07-01 | Disposition: A | Discharge status: Home / Self Care | Payer: Medicare Other | Source: Ambulatory Visit

## 2016-07-01 DIAGNOSIS — J438 Other emphysema: Secondary | ICD-10-CM

## 2016-07-02 ENCOUNTER — Other Ambulatory Visit: Payer: Self-pay | Admitting: *Deleted

## 2016-07-02 NOTE — Patient Outreach (Signed)
[facility]) Care Management  07/02/2016  [name] September 09, 1946 [id number]   RN Health Coach telephone call to patient.  [employer]ipaa compliance verified. Patient answered phone. He is very short of breath. Per patient he was doing his breathing treatment. RN explained to patient she will call him back in a couple of hours to let him finish his treatment. Plan: RN will call patient again later today  [facility] [phone]

## 2016-07-05 ENCOUNTER — Encounter: Payer: Self-pay | Admitting: *Deleted

## 2016-07-05 ENCOUNTER — Other Ambulatory Visit: Payer: Self-pay | Admitting: *Deleted

## 2016-07-05 NOTE — Patient Outreach (Signed)
[facility]) Care Management  07/05/2016   [name] 1946/06/01 [id number]  Subjective: RN Health Coach telephone call to patient.  Hipaa compliance verified. Per patient he had just finished doing his nebulizer treatments. Patient is in the green zone today. Patient stated when the humidity is up he has increase shortness of breath. Patient stated he has not had to use his rescue inhalers only once or twice in a week.  Patient has been dismissed from going to rehab. Per patient his blood oxygen would drop in the low 80's at rehab. Patient stated he didn't go to three classes.  Patient is not doing any additional exercises at this time. RN discussed with patient about doing chair exercises. Per patient his appetite is good but he is not loosing any weight. Patient understands that it is due to him not exercising. Patient stated he is going out with his portable tanks.     Objective:   Current Medications:  Current Outpatient Prescriptions  Medication Sig Dispense Refill  . albuterol (PROVENTIL) (2.5 MG/3ML) 0.083% nebulizer solution Take 3 mLs (2.5 mg total) by nebulization 3 (three) times daily. 360 mL 3  . Albuterol Sulfate (PROAIR RESPICLICK) 371 (90 Base) MCG/ACT AEPB Inhale 2 puffs into the lungs every 6 (six) hours as needed. 1 each 5  . ALPRAZolam (XANAX) 0.5 MG tablet Take 0.5 mg by mouth 3 (three) times daily as needed for anxiety or sleep.    [name] aspirin 81 MG tablet Take 81 mg by mouth at bedtime.     . budesonide (PULMICORT) 0.25 MG/2ML nebulizer solution Take 2 mLs (0.25 mg total) by nebulization 2 (two) times daily. 60 mL 5  . Flaxseed, Linseed, (FLAXSEED OIL) 1000 MG CAPS Take 1 capsule by mouth daily.      . furosemide (LASIX) 40 MG tablet Take 40 mg by mouth 2 (two) times daily.    [name] ibuprofen (ADVIL,MOTRIN) 600 MG tablet Take 600 mg by mouth every 6 (six) hours as needed for moderate pain.    [name] ipratropium-albuterol (DUONEB) 0.5-2.5 (3) MG/3ML SOLN Take  3 mLs by nebulization 4 (four) times daily. 120 mL 5  . isosorbide mononitrate (IMDUR) 30 MG 24 hr tablet Take 1 tablet (30 mg total) by mouth daily. 30 tablet 11  . lisinopril (PRINIVIL,ZESTRIL) 10 MG tablet Take 1 tablet (10 mg total) by mouth daily. 30 tablet 0  . metoprolol succinate (TOPROL-XL) 25 MG 24 hr tablet TAKE 2 TABLETS BY MOUTH IN THE MORNING AND 1 TABLET BY MOUTH IN THE THE EVENING    . Multiple Vitamin (MULTIVITAMIN) capsule Take 1 capsule by mouth daily.      . nitroGLYCERIN (NITROSTAT) 0.4 MG SL tablet Place 1 tablet (0.4 mg total) under the tongue every 5 (five) minutes as needed for chest pain. 25 tablet 3  . Omega-3 Fatty Acids (FISH OIL) 1200 MG CAPS Take 1 capsule by mouth daily.      . simvastatin (ZOCOR) 20 MG tablet Take 1 tablet (20 mg total) by mouth daily at 6 PM. 30 tablet 2  . sodium chloride (OCEAN) 0.65 % SOLN nasal spray Place 1 spray into both nostrils as needed for congestion.  0  . albuterol (PROVENTIL HFA;VENTOLIN HFA) 108 (90 BASE) MCG/ACT inhaler Inhale 2 puffs into the lungs 4 (four) times daily as needed for wheezing (for use when not at home / unable to use nebulizer). (Patient not taking: Reported on 05/05/2016) 1 Inhaler 0   No current facility-administered medications  for this visit.     Functional Status:  In your present state of health, do you have any difficulty performing the following activities: 07/05/2016 05/24/2016  Hearing? N N  Vision? N N  Difficulty concentrating or making decisions? N N  Walking or climbing stairs? Y Y  Dressing or bathing? N N  Doing errands, shopping? [name]  Preparing Food and eating ? Y Y  Using the Toilet? N N  In the past six months, have you accidently leaked urine? N N  Do you have problems with loss of bowel control? N N  Managing your Medications? N N  Managing your Finances? N N  Housekeeping or managing your Housekeeping? [name]  Some recent data might be hidden    Fall/Depression Screening: PHQ 2/9  Scores 07/05/2016 05/24/2016 04/09/2016 03/11/2016 02/20/2016 01/28/2016 01/28/2016  PHQ - 2 Score '1 1 1 4 4 1 1  ' PHQ- 9 Score 8 - - 11 11 - -   THN CM Care Plan Problem One   Flowsheet Row Most Recent Value  Care Plan Problem One  Knowledge deficit in self management of COPD  Role Documenting the Problem One  [place] for Problem One  Active  THN Long Term Goal (31-90 days)  Patient will continue to not have any readmission for COPD within the next 90 days  THN Long Term Goal Start Date  07/05/16  Interventions for Problem One [place] reminded patient to keep his appointments with PCP and pulmonologist. [place] will monitor patient by monthly telephonic. RN Health Coach reminded patient the importance of taking medication as per physician order.. RN encourages patient to talk to physician about concerns of oxygen therapy  THN CM Short Term Goal #1 (0-30 days)  Patient will be able to verbalize  3 signs of COPD within the next 30 days  THN CM Short Term Goal #1 Met Date  07/05/16  Interventions for Short Term Goal #1  RN Health Coach sent educational material on COPD zones. RN sent large refrigerator magnet as a reminder of signs and symptoms and action plan. Rn will follow up with discussion and teachback.  THN CM Short Term Goal #2 (0-30 days)  Patient will be able to verbalize eating healthier foods within the next 30 days  THN CM Short Term Goal #2 Start Date  05/24/16  Interventions for Short Term Goal #2  Patient instructed to eat smaller meals more often. Patient instructed to rest between meals. RN sent educational material on low sodium diet and  Dash eating plan.  THN CM Short Term Goal #3 (0-30 days)  Patient will be able to verbalize that the periphal edema is decreasing in lower extremities  Interventions for Short Tern Goal #3  RN sent patient educational material on signs and symptoms of peripheral edema. RN discussed with patient about  decreasing sodium in diet wil help. RN will follow up with discussion.RN will send patient information on compression hose and how to put them on easier.  THN CM Short Term Goal #4 (0-30 days)  patient will report that he is trying to do chair exercises sine he is not in rehab within the next 30 days  THN CM Short Term Goal #4 Start Date  07/05/16  Interventions for Short Term Goal #4  RN discussed with patient about doing exercises. RN sent patient picture chart and information on chair exercises.      Assessment: Patient  is not doing any exercising since dismissed from Rehab Patient will continue to benefit from [employer] telephonic outreach for education and support for COPD self management.    Plan:  RN sent patient educational material on chair exercises RN sent patient educational material on serving sizes RN sent patient educational material on Healthy eating RN sent patient educational material on [employer] Exhaustion RN will follow up in the month of September  [place] [facility] [phone]

## 2016-07-13 DIAGNOSIS — I10 Essential (primary) hypertension: Secondary | ICD-10-CM | POA: Diagnosis not present

## 2016-07-13 DIAGNOSIS — E784 Other hyperlipidemia: Secondary | ICD-10-CM | POA: Diagnosis not present

## 2016-07-13 DIAGNOSIS — Z125 Encounter for screening for malignant neoplasm of prostate: Secondary | ICD-10-CM | POA: Diagnosis not present

## 2016-07-15 NOTE — Addendum Note (Signed)
Encounter addended by: Jewel Baize, RD on: 07/15/2016  2:58 PM<BR>    Actions taken: Flowsheet data copied forward, Visit Navigator Flowsheet section accepted

## 2016-07-20 DIAGNOSIS — Z23 Encounter for immunization: Secondary | ICD-10-CM | POA: Diagnosis not present

## 2016-07-20 DIAGNOSIS — Z6839 Body mass index (BMI) 39.0-39.9, adult: Secondary | ICD-10-CM | POA: Diagnosis not present

## 2016-07-20 DIAGNOSIS — I25118 Atherosclerotic heart disease of native coronary artery with other forms of angina pectoris: Secondary | ICD-10-CM | POA: Diagnosis not present

## 2016-07-20 DIAGNOSIS — Z1212 Encounter for screening for malignant neoplasm of rectum: Secondary | ICD-10-CM | POA: Diagnosis not present

## 2016-07-20 DIAGNOSIS — I10 Essential (primary) hypertension: Secondary | ICD-10-CM | POA: Diagnosis not present

## 2016-07-20 DIAGNOSIS — Z1389 Encounter for screening for other disorder: Secondary | ICD-10-CM | POA: Diagnosis not present

## 2016-07-20 DIAGNOSIS — J449 Chronic obstructive pulmonary disease, unspecified: Secondary | ICD-10-CM | POA: Diagnosis not present

## 2016-07-20 DIAGNOSIS — I272 Other secondary pulmonary hypertension: Secondary | ICD-10-CM | POA: Diagnosis not present

## 2016-07-20 DIAGNOSIS — E784 Other hyperlipidemia: Secondary | ICD-10-CM | POA: Diagnosis not present

## 2016-07-20 DIAGNOSIS — R600 Localized edema: Secondary | ICD-10-CM | POA: Diagnosis not present

## 2016-07-20 DIAGNOSIS — Z0000 Encounter for general adult medical examination without abnormal findings: Secondary | ICD-10-CM | POA: Diagnosis not present

## 2016-07-20 NOTE — Addendum Note (Signed)
Encounter addended by: Lance Morin, RN on: 07/20/2016  9:17 AM<BR>    Actions taken: Episode resolved

## 2016-07-20 NOTE — Addendum Note (Signed)
Encounter addended by: Lance Morin, RN on: 07/20/2016  9:15 AM<BR>    Actions taken: Sign clinical note

## 2016-07-20 NOTE — Progress Notes (Signed)
Discharge Summary  Patient Details  Name: [name] MRN: [id number] Date of Birth: Apr 16, 1946 Referring Provider:     Number of Visits: 19  Reason for Discharge:  Early Exit:  Due to poor attendance, he stopped coming to the exercise sessions.  Smoking History:  History  Smoking Status  . Former Smoker  . Packs/day: 2.00  . Years: 45.00  . Types: Cigarettes  . Quit date: 10/17/2015  Smokeless Tobacco  . Never Used    Diagnosis:  Other emphysema ([place])  ADL [facility]:   Initial Exercise Prescription:     Initial Exercise Prescription - 02/26/16 0700      Date of Initial Exercise RX and Referring Provider   Date 02/26/16     Oxygen   Oxygen Continuous   Liters 4     Arm Ergometer   Level 1   Watts 13   Minutes 15     Track   Laps 3   Minutes 15     Prescription Details   Frequency (times per week) 2   Duration Progress to 45 minutes of aerobic exercise without signs/symptoms of physical distress     Intensity   THRR 40-80% of Max Heartrate 60-120   Ratings of Perceived Exertion 11-13   Perceived Dyspnea 0-4     Progression   Progression Continue progressive overload as per policy without signs/symptoms or physical distress.     Resistance Training   Training Prescription Yes   Weight blue bands   Reps 10-12      Discharge Exercise Prescription (Final Exercise Prescription Changes):     Exercise Prescription Changes - 06/01/16 1600      Response to Exercise   Blood Pressure (Admit) 126/64   Blood Pressure (Exit) 120/70   Heart Rate (Admit) 90 bpm   Heart Rate (Exit) 80 bpm   Oxygen Saturation (Admit) 95 %   Oxygen Saturation (Exit) 88 %   Symptoms weight gain of 6 kg since last visit of 05/20/16, SOB   Comments did not exercise, counselled w/wife to weigh daily, low NA diet, take Lasix as prescribed, he agreed.     Resistance Training   Weight blue bands   Reps 10-12      Functional Capacity:     6 Minute Walk    Row Name  02/24/16 1642         6 Minute Walk   Phase Initial     Distance 400 feet     Walk Time 3.12 minutes     # of Rest Breaks 2     MPH 0.93     METS 0.93     RPE 13     Perceived Dyspnea  3     VO2 Peak 3.23     Symptoms No     Resting HR 90 bpm     Resting BP 122/60     Max Ex. HR 105 bpm     Max Ex. BP 150/68       Interval HR   Baseline HR 90     2 Minute HR 90     4 Minute HR 100     6 Minute HR 105     Interval Heart Rate? Yes       Interval Oxygen   Interval Oxygen? Yes     Baseline Oxygen Saturation % 87 %     Baseline Liters of Oxygen 2 L     1 Minute Oxygen Saturation % 84 %  1 Minute Liters of Oxygen 2 L     2 Minute Oxygen Saturation % 84 %     2 Minute Liters of Oxygen 3 L     3 Minute Oxygen Saturation % 85 %     3 Minute Liters of Oxygen 3 L     4 Minute Oxygen Saturation % 82 %     4 Minute Liters of Oxygen 4 L     5 Minute Oxygen Saturation % 85 %     5 Minute Liters of Oxygen 4 L     6 Minute Oxygen Saturation % 82 %     6 Minute Liters of Oxygen 4 L     2 Minute Post Oxygen Saturation % 88 %     2 Minute Post Liters of Oxygen 4 L        Psychological, QOL, Others - Outcomes: PHQ 2/9: Depression screen [facility] 2/9 07/05/2016 07/05/2016 05/24/2016 04/09/2016 03/11/2016  Decreased Interest - 0 0 0 3  Down, Depressed, Hopeless - '1 1 1 1  '$ PHQ - 2 Score - '1 1 1 4  '$ Altered sleeping - 1 - - 1  Tired, decreased energy - 3 - - 3  Change in appetite - 0 - - 0  Feeling bad or failure about yourself  - 0 - - 0  Trouble concentrating - 3 - - 3  Moving slowly or fidgety/restless - 0 - - 0  Suicidal thoughts - 0 - - 0  PHQ-9 Score - 8 - - 11  Difficult doing work/chores Not difficult at all Somewhat difficult - - Somewhat difficult    Quality of Life:   Personal Goals: Goals established at orientation with interventions provided to work toward goal.     Personal Goals and Risk Factors at Admission - 02/20/16 1130      Core Components/Risk  Factors/Patient Goals on Admission    Weight Management (P)  Obesity;Weight Loss;Yes   Intervention (P)  Obesity: Provide education and appropriate resources to help participant work on and attain dietary goals.;Weight Management/Obesity: Establish reasonable short term and long term weight goals.   Expected Outcomes (P)  Short Term: Continue to assess and modify interventions until short term weight is achieved;Long Term: Adherence to nutrition and physical activity/exercise program aimed toward attainment of established weight goal;Weight Loss: Understanding of general recommendations for a balanced deficit meal plan, which promotes 1-2 lb weight loss per week and includes a negative energy balance of [phone] kcal/d;Understanding recommendations for meals to include 15-35% energy as protein, 25-35% energy from fat, 35-60% energy from carbohydrates, less than '200mg'$  of dietary cholesterol, 20-35 gm of total fiber daily;Understanding of distribution of calorie intake throughout the day with the consumption of 4-5 meals/snacks   Sedentary (P)  Yes   Intervention (P)  Provide advice, education, support and counseling about physical activity/exercise needs.;Develop an individualized exercise prescription for aerobic and resistive training based on initial evaluation findings, risk stratification, comorbidities and participant's personal goals.   Expected Outcomes (P)  Achievement of increased cardiorespiratory fitness and enhanced flexibility, muscular endurance and strength shown through measurements of functional capacity and personal statement of participant.   Increase Strength and Stamina (P)  Yes   Intervention (P)  Provide advice, education, support and counseling about physical activity/exercise needs.;Develop an individualized exercise prescription for aerobic and resistive training based on initial evaluation findings, risk stratification, comorbidities and participant's personal goals.   Expected  Outcomes (P)  Achievement of increased cardiorespiratory fitness and enhanced  flexibility, muscular endurance and strength shown through measurements of functional capacity and personal statement of participant.   Improve shortness of breath with ADL's (P)  Yes   Intervention (P)  Provide education, individualized exercise plan and daily activity instruction to help decrease symptoms of SOB with activities of daily living.   Expected Outcomes (P)  Short Term: Achieves a reduction of symptoms when performing activities of daily living.   Develop more efficient breathing techniques such as purse lipped breathing and diaphragmatic breathing; and practicing self-pacing with activity (P)  Yes   Intervention (P)  Provide education, demonstration and support about specific breathing techniuqes utilized for more efficient breathing. Include techniques such as pursed lipped breathing, diaphragmatic breathing and self-pacing activity.   Expected Outcomes (P)  Short Term: Participant will be able to demonstrate and use breathing techniques as needed throughout daily activities.   Increase knowledge of respiratory medications and ability to use respiratory devices properly  (P)  Yes   Intervention (P)  Provide education and demonstration as needed of appropriate use of medications, inhalers, and oxygen therapy.   Expected Outcomes (P)  Short Term: Achieves understanding of medications use. Understands that oxygen is a medication prescribed by physician. Demonstrates appropriate use of inhaler and oxygen therapy.   Hypertension (P)  Yes   Intervention (P)  Provide education on lifestyle modifcations including regular physical activity/exercise, weight management, moderate sodium restriction and increased consumption of fresh fruit, vegetables, and low fat dairy, alcohol moderation, and smoking cessation.;Monitor prescription use compliance.   Expected Outcomes (P)  Short Term: Continued assessment and intervention  until BP is < 140/48m HG in hypertensive participants. < 130/827mHG in hypertensive participants with diabetes, heart failure or chronic kidney disease.;Long Term: Maintenance of blood pressure at goal levels.   Stress (P)  Yes   Intervention (P)  Offer individual and/or small group education and counseling on adjustment to heart disease, stress management and health-related lifestyle change. Teach and support self-help strategies.;Refer participants experiencing significant psychosocial distress to appropriate mental health specialists for further evaluation and treatment. When possible, include family members and significant others in education/counseling sessions.   Expected Outcomes (P)  Short Term: Participant demonstrates changes in health-related behavior, relaxation and other stress management skills, ability to obtain effective social support, and compliance with psychotropic medications if prescribed.;Long Term: Emotional wellbeing is indicated by absence of clinically significant psychosocial distress or social isolation.   Personal Goal Other (P)  Yes       Personal Goals Discharge:     Goals and Risk Factor Review    Row Name 03/18/16 1522 03/30/16 1707 04/27/16 1645 05/24/16 1305 06/22/16 0849     Core Components/Risk Factors/Patient Goals Review   Personal Goals Review Weight Management/Obesity  - Increase Strength and Stamina;Improve shortness of breath with ADL's;Weight Management/Obesity Increase Strength and Stamina;Weight Management/Obesity;Improve shortness of breath with ADL's  -   Review see 03/18/16 RD note -  workloads have been increased as his strength and stamina increase, purse lip breathing technique is improving no weight loss, strength and stamina are improving, shortness of breath has not drastically improved. will graduate in 5 more sessions, no weight loss despite regular attendance, workloads  are up to level 6 on nustep, level 3 on arm ergomenter, and 8 laps on  track has been absent for 3 weeks due to gaining 12 pounds of fluid, quit taking his afternoon diuretic, instructed to weigh daily, take medications as prescribed and call physician if weight does not drastically  reduce .  He has not called hos pulmonologist to se up an appointment.     Expected Outcomes 0.5-2 lb wt loss/week to a goal wt loss of 6-24 lb at graduation from Pulmonary Rehab -  Continued increase in strength and stamina with a decrese in SOB weight loss and increased strength and stamina maintain exercise level after discharge from program. weight loss and finish program, take medications as prescribed.        Nutrition & Weight - Outcomes:     Pre Biometrics - 02/20/16 1203      Pre Biometrics   Grip Strength 35 kg       Nutrition:     Nutrition Therapy & Goals - 03/18/16 1530      Nutrition Therapy   Diet Therapeutic Lifestyle Changes     Personal Nutrition Goals   Personal Goal #1 0.5-2 lb wt loss/week to a goal wt loss of 6-24 lb at graduation from [place], educate and counsel regarding individualized specific dietary modifications aiming towards targeted core components such as weight, hypertension, lipid management, diabetes, heart failure and other comorbidities.;Nutrition handout(s) given to patient.  1800 kcal, 5-day menu ideas given   Expected Outcomes Short Term Goal: Understand basic principles of dietary content, such as calories, fat, sodium, cholesterol and nutrients.;Long Term Goal: Adherence to prescribed nutrition plan.      Nutrition Discharge:     Nutrition Assessments - 03/18/16 1522      Rate Your Plate Scores   Pre Score 54      Education Questionnaire Score:   Goals reviewed with patient; copy given to patient.++

## 2016-08-05 ENCOUNTER — Ambulatory Visit (INDEPENDENT_AMBULATORY_CARE_PROVIDER_SITE_OTHER): Payer: Medicare Other | Admitting: Pulmonary Disease

## 2016-08-05 ENCOUNTER — Encounter: Payer: Self-pay | Admitting: Pulmonary Disease

## 2016-08-05 VITALS — BP 140/70 | HR 67 | Temp 97.5°F | Ht 66.0 in | Wt 243.0 lb

## 2016-08-05 DIAGNOSIS — J9611 Chronic respiratory failure with hypoxia: Secondary | ICD-10-CM

## 2016-08-05 DIAGNOSIS — J449 Chronic obstructive pulmonary disease, unspecified: Secondary | ICD-10-CM

## 2016-08-05 DIAGNOSIS — I2510 Atherosclerotic heart disease of native coronary artery without angina pectoris: Secondary | ICD-10-CM

## 2016-08-05 DIAGNOSIS — I10 Essential (primary) hypertension: Secondary | ICD-10-CM | POA: Diagnosis not present

## 2016-08-05 DIAGNOSIS — J441 Chronic obstructive pulmonary disease with (acute) exacerbation: Secondary | ICD-10-CM | POA: Diagnosis not present

## 2016-08-05 DIAGNOSIS — I272 Other secondary pulmonary hypertension: Secondary | ICD-10-CM

## 2016-08-05 DIAGNOSIS — Z87891 Personal history of nicotine dependence: Secondary | ICD-10-CM

## 2016-08-05 DIAGNOSIS — E669 Obesity, unspecified: Secondary | ICD-10-CM

## 2016-08-05 DIAGNOSIS — I420 Dilated cardiomyopathy: Secondary | ICD-10-CM

## 2016-08-05 MED ORDER — PREDNISONE 20 MG PO TABS: ORAL_TABLET | ORAL | 0 refills | Status: DC

## 2016-08-05 MED ORDER — LEVOFLOXACIN 500 MG PO TABS: 500.0000 mg | ORAL_TABLET | Freq: Every day | ORAL | 1 refills | Status: DC

## 2016-08-05 NOTE — Patient Instructions (Signed)
Today we updated your med list in our EPIC system...    Continue your current medications the same...  Today we wrote for an antibiotic for your sinuses and bronchial tubes>>    Take the [facility] '500mg'$  one tab daily x 7d til gone...  We also wrote for a tapering course of the PREDNISONE '20mg'$  tabs for inflammation>>    Start w/ 1 tab twice daily for 4 days,     Then decrease to one tab each AM for 4 days,     Then decrease to 1/2 tab daily each AM for 4 days,    Then decrease to 1/2 tab every other day til gone (1/2, 0, 1/2, 0, etc)  Continue the Duoneb 4 times daily & the Budesonide twice daily as you are doing...  Work on weight reduction...  Call for any questions...  Let's plan a follow up visit in 3-77mo sooner if needed for problems..Marland KitchenMarland Kitchen

## 2016-08-09 ENCOUNTER — Other Ambulatory Visit: Payer: Self-pay | Admitting: Internal Medicine

## 2016-08-09 DIAGNOSIS — Z87891 Personal history of nicotine dependence: Secondary | ICD-10-CM

## 2016-08-10 ENCOUNTER — Other Ambulatory Visit: Payer: Self-pay | Admitting: *Deleted

## 2016-08-11 ENCOUNTER — Encounter: Payer: Self-pay | Admitting: *Deleted

## 2016-08-11 NOTE — Patient Outreach (Signed)
[name]) Care Management  08/11/2016   [name] September 07, 1946 [id number]  Subjective: RN Health Coach telephone call to patient.  [employer] compliance verified. Per patient he is feeling very good. Per patient is in the green zone. Per patient he is tapering  his prednisone for sinusitis. Patient stated that he received the information on chair exercises and has read it but has not tried any of them. Patient stated he does go outside of the house.  Per patient he had a mishap with his oxygen tubing. Per patient it got twisted up near his neck and he couldn't get any oxygen. Patient ordered shorter tubing. The new tubing arrived 08/10/16  Patient is able to ambulate but his knees hurts him some. Patient has not had any recent falls. Patient has agreed to follow up outreach calls.     Objective:   Current Medications:  Current Outpatient Prescriptions  Medication Sig Dispense Refill  . albuterol (PROVENTIL HFA;VENTOLIN HFA) 108 (90 BASE) MCG/ACT inhaler Inhale 2 puffs into the lungs 4 (four) times daily as needed for wheezing (for use when not at home / unable to use nebulizer). 1 Inhaler 0  . Albuterol Sulfate (PROAIR RESPICLICK) 166 (90 Base) MCG/ACT AEPB Inhale 2 puffs into the lungs every 6 (six) hours as needed. 1 each 5  . ALPRAZolam (XANAX) 0.5 MG tablet Take 0.5 mg by mouth 3 (three) times daily as needed for anxiety or sleep.    [name] aspirin 81 MG tablet Take 81 mg by mouth at bedtime.     . budesonide (PULMICORT) 0.25 MG/2ML nebulizer solution Take 2 mLs (0.25 mg total) by nebulization 2 (two) times daily. 60 mL 5  . Flaxseed, Linseed, (FLAXSEED OIL) 1000 MG CAPS Take 1 capsule by mouth daily.      . furosemide (LASIX) 40 MG tablet Take 40 mg by mouth 2 (two) times daily.    [name] ibuprofen (ADVIL,MOTRIN) 600 MG tablet Take 600 mg by mouth every 6 (six) hours as needed for moderate pain.    [name] ipratropium-albuterol (DUONEB) 0.5-2.5 (3) MG/3ML SOLN Take 3 mLs by  nebulization 4 (four) times daily. 120 mL 5  . isosorbide mononitrate (IMDUR) 30 MG 24 hr tablet Take 1 tablet (30 mg total) by mouth daily. 30 tablet 11  . levofloxacin (LEVAQUIN) 500 MG tablet Take 1 tablet (500 mg total) by mouth daily. 7 tablet 1  . lisinopril (PRINIVIL,ZESTRIL) 10 MG tablet Take 1 tablet (10 mg total) by mouth daily. 30 tablet 0  . metoprolol succinate (TOPROL-XL) 25 MG 24 hr tablet TAKE 2 TABLETS BY MOUTH IN THE MORNING AND 1 TABLET BY MOUTH IN THE THE EVENING    . Multiple Vitamin (MULTIVITAMIN) capsule Take 1 capsule by mouth daily.      . nitroGLYCERIN (NITROSTAT) 0.4 MG SL tablet Place 1 tablet (0.4 mg total) under the tongue every 5 (five) minutes as needed for chest pain. 25 tablet 3  . Omega-3 Fatty Acids (FISH OIL) 1200 MG CAPS Take 1 capsule by mouth daily.      . predniSONE (DELTASONE) 20 MG tablet Take as directed 20 tablet 0  . simvastatin (ZOCOR) 20 MG tablet Take 1 tablet (20 mg total) by mouth daily at 6 PM. 30 tablet 2  . sodium chloride (OCEAN) 0.65 % SOLN nasal spray Place 1 spray into both nostrils as needed for congestion.  0   No current facility-administered medications for this visit.     Functional Status:  In your  present state of health, do you have any difficulty performing the following activities: 08/11/2016 07/05/2016  Hearing? N N  Vision? - N  Difficulty concentrating or making decisions? N N  Walking or climbing stairs? Y Y  Dressing or bathing? N N  Doing errands, shopping? [name]  Preparing Food and eating ? Y Y  Using the Toilet? N N  In the past six months, have you accidently leaked urine? N N  Do you have problems with loss of bowel control? N N  Managing your Medications? N N  Managing your Finances? N N  Housekeeping or managing your Housekeeping? [name]  Some recent data might be hidden    Fall/Depression Screening: PHQ 2/9 Scores 08/10/2016 07/05/2016 05/24/2016 04/09/2016 03/11/2016 02/20/2016 01/28/2016  PHQ - 2 Score '1 1 1 1 4 4  1  '$ PHQ- 9 Score 8 8 - - 11 11 -   THN CM Care Plan Problem One   Flowsheet Row Most Recent Value  Care Plan Problem One  Knowledge deficit in self management of COPD  Role Documenting the Problem One  [place] for Problem One  Active  THN Long Term Goal (31-90 days)  Patient will continue to not have any readmission for COPD within the next 90 days  THN Long Term Goal Start Date  08/11/16  Interventions for Problem One [place] reminded patient to keep his appointments with PCP and pulmonologist. Winnie will monitor patient by monthly telephonic. RN Health Coach reminded patient the importance of taking medication as per physician order.. RN encourages patient to talk to physician about concerns of oxygen therapy  THN CM Short Term Goal #2 (0-30 days)  Patient will be able to verbalize eating healthier foods within the next 30 days  THN CM Short Term Goal #2 Start Date  08/11/16  Interventions for Short Term Goal #2  Patient instructed to eat smaller meals more often. Patient instructed to rest between meals. RN sent educational material on low sodium diet and  Dash eating plan.  THN CM Short Term Goal #3 Start Date  08/10/16  Interventions for Short Tern Goal #3  RN sent patient educational material on signs and symptoms of peripheral edema. RN discussed with patient about decreasing sodium in diet wil help. RN will follow up with discussion.RN will send patient information on compression hose and how to put them on easier..RN disucced with patient about elevation of lower extremities  THN CM Short Term Goal #4 (0-30 days)  patient will report that he is trying to do chair exercises sine he is not in rehab within the next 30 days  THN CM Short Term Goal #4 Start Date  08/10/16  Interventions for Short Term Goal #4  RN discussed with patient about doing exercises. RN sent patient picture chart and information on chair exercises.        Assessment: Patient is on a prednisone pack for sinusitis Patient is doing a regular exercise routine. Patient will continue  benefit from Health Coach telephonic outreach for education and support for COPD self management.   Plan: RN sent educational material on O2 safety RN sent educational material ion Sinusitis RN sent educational material on Bronchitis RN sent educational material on Health Maintenance RN sent a calendar book for patient to keep up with appointments  RN will follow up outreach with patient in November  [name] [place] [facility] [phone]

## 2016-08-19 ENCOUNTER — Ambulatory Visit
Admission: RE | Admit: 2016-08-19 | Discharge: 2016-08-19 | Disposition: A | Discharge status: Home / Self Care | Payer: Medicare Other | Source: Ambulatory Visit | Attending: Internal Medicine | Admitting: Internal Medicine

## 2016-08-19 DIAGNOSIS — Z87891 Personal history of nicotine dependence: Secondary | ICD-10-CM

## 2016-09-23 DIAGNOSIS — R069 Unspecified abnormalities of breathing: Secondary | ICD-10-CM | POA: Diagnosis not present

## 2016-09-24 ENCOUNTER — Inpatient Hospital Stay (HOSPITAL_COMMUNITY)
Admission: EM | Admit: 2016-09-24 | Discharge: 2016-09-29 | DRG: 190 | Disposition: A | Discharge status: Home / Self Care | Payer: Medicare Other | Attending: Internal Medicine | Admitting: Internal Medicine

## 2016-09-24 ENCOUNTER — Encounter (HOSPITAL_COMMUNITY): Payer: Self-pay

## 2016-09-24 ENCOUNTER — Emergency Department (HOSPITAL_COMMUNITY): Payer: Medicare Other

## 2016-09-24 DIAGNOSIS — Z8249 Family history of ischemic heart disease and other diseases of the circulatory system: Secondary | ICD-10-CM | POA: Diagnosis not present

## 2016-09-24 DIAGNOSIS — I472 Ventricular tachycardia: Secondary | ICD-10-CM | POA: Diagnosis present

## 2016-09-24 DIAGNOSIS — J9621 Acute and chronic respiratory failure with hypoxia: Secondary | ICD-10-CM | POA: Diagnosis present

## 2016-09-24 DIAGNOSIS — I5033 Acute on chronic diastolic (congestive) heart failure: Secondary | ICD-10-CM | POA: Diagnosis present

## 2016-09-24 DIAGNOSIS — I509 Heart failure, unspecified: Secondary | ICD-10-CM | POA: Diagnosis not present

## 2016-09-24 DIAGNOSIS — R0602 Shortness of breath: Secondary | ICD-10-CM | POA: Diagnosis not present

## 2016-09-24 DIAGNOSIS — Z87891 Personal history of nicotine dependence: Secondary | ICD-10-CM

## 2016-09-24 DIAGNOSIS — Z8551 Personal history of malignant neoplasm of bladder: Secondary | ICD-10-CM

## 2016-09-24 DIAGNOSIS — J441 Chronic obstructive pulmonary disease with (acute) exacerbation: Principal | ICD-10-CM | POA: Diagnosis present

## 2016-09-24 DIAGNOSIS — Z6838 Body mass index (BMI) 38.0-38.9, adult: Secondary | ICD-10-CM | POA: Diagnosis not present

## 2016-09-24 DIAGNOSIS — R0600 Dyspnea, unspecified: Secondary | ICD-10-CM | POA: Diagnosis not present

## 2016-09-24 DIAGNOSIS — D539 Nutritional anemia, unspecified: Secondary | ICD-10-CM | POA: Diagnosis present

## 2016-09-24 DIAGNOSIS — I110 Hypertensive heart disease with heart failure: Secondary | ICD-10-CM | POA: Diagnosis present

## 2016-09-24 DIAGNOSIS — Z9981 Dependence on supplemental oxygen: Secondary | ICD-10-CM | POA: Diagnosis not present

## 2016-09-24 DIAGNOSIS — I10 Essential (primary) hypertension: Secondary | ICD-10-CM | POA: Diagnosis present

## 2016-09-24 DIAGNOSIS — Z7951 Long term (current) use of inhaled steroids: Secondary | ICD-10-CM

## 2016-09-24 DIAGNOSIS — J9622 Acute and chronic respiratory failure with hypercapnia: Secondary | ICD-10-CM | POA: Diagnosis not present

## 2016-09-24 DIAGNOSIS — Z79899 Other long term (current) drug therapy: Secondary | ICD-10-CM | POA: Diagnosis not present

## 2016-09-24 DIAGNOSIS — F419 Anxiety disorder, unspecified: Secondary | ICD-10-CM | POA: Diagnosis not present

## 2016-09-24 DIAGNOSIS — Z7982 Long term (current) use of aspirin: Secondary | ICD-10-CM

## 2016-09-24 DIAGNOSIS — I420 Dilated cardiomyopathy: Secondary | ICD-10-CM | POA: Diagnosis present

## 2016-09-24 DIAGNOSIS — Z86010 Personal history of colonic polyps: Secondary | ICD-10-CM | POA: Diagnosis not present

## 2016-09-24 DIAGNOSIS — Z7952 Long term (current) use of systemic steroids: Secondary | ICD-10-CM

## 2016-09-24 DIAGNOSIS — I2510 Atherosclerotic heart disease of native coronary artery without angina pectoris: Secondary | ICD-10-CM | POA: Diagnosis not present

## 2016-09-24 DIAGNOSIS — E669 Obesity, unspecified: Secondary | ICD-10-CM | POA: Diagnosis present

## 2016-09-24 DIAGNOSIS — E7800 Pure hypercholesterolemia, unspecified: Secondary | ICD-10-CM | POA: Diagnosis present

## 2016-09-24 LAB — BASIC METABOLIC PANEL
Anion gap: 9 (ref 5–15)
BUN: 10 mg/dL (ref 6–20)
CHLORIDE: 100 mmol/L — AB (ref 101–111)
CO2: 30 mmol/L (ref 22–32)
Calcium: 9 mg/dL (ref 8.9–10.3)
Creatinine, Ser: 0.74 mg/dL (ref 0.61–1.24)
GFR calc non Af Amer: >60 mL/min (ref >60)
Glucose, Bld: 166 mg/dL — ABNORMAL HIGH (ref 65–99)
POTASSIUM: 3.8 mmol/L (ref 3.5–5.1)
SODIUM: 139 mmol/L (ref 135–145)

## 2016-09-24 LAB — I-STAT ARTERIAL BLOOD GAS, ED
ACID-BASE EXCESS: 5 mmol/L — AB (ref 0.0–2.0)
BICARBONATE: 33.2 mmol/L — AB (ref 20.0–28.0)
O2 Saturation: 98 %
PH ART: 7.318 — AB (ref 7.350–7.450)
TCO2: 35 mmol/L (ref 0–100)
pCO2 arterial: 64.6 mmHg — ABNORMAL HIGH (ref 32.0–48.0)
pO2, Arterial: 128 mmHg — ABNORMAL HIGH (ref 83.0–108.0)

## 2016-09-24 LAB — RESPIRATORY PANEL BY PCR
Adenovirus: NOT DETECTED
BORDETELLA PERTUSSIS-RVPCR: NOT DETECTED
CHLAMYDOPHILA PNEUMONIAE-RVPPCR: NOT DETECTED
CORONAVIRUS 229E-RVPPCR: NOT DETECTED
CORONAVIRUS OC43-RVPPCR: NOT DETECTED
Coronavirus HKU1: NOT DETECTED
Coronavirus NL63: NOT DETECTED
INFLUENZA A-RVPPCR: NOT DETECTED
INFLUENZA B-RVPPCR: NOT DETECTED
MYCOPLASMA PNEUMONIAE-RVPPCR: NOT DETECTED
Metapneumovirus: NOT DETECTED
PARAINFLUENZA VIRUS 1-RVPPCR: NOT DETECTED
PARAINFLUENZA VIRUS 4-RVPPCR: NOT DETECTED
Parainfluenza Virus 2: NOT DETECTED
Parainfluenza Virus 3: NOT DETECTED
RESPIRATORY SYNCYTIAL VIRUS-RVPPCR: NOT DETECTED
Rhinovirus / Enterovirus: NOT DETECTED

## 2016-09-24 LAB — RETICULOCYTES
RBC.: 3.22 MIL/uL — ABNORMAL LOW (ref 4.22–5.81)
RETIC COUNT ABSOLUTE: 202.9 10*3/uL — AB (ref 19.0–186.0)
Retic Ct Pct: 6.3 % — ABNORMAL HIGH (ref 0.4–3.1)

## 2016-09-24 LAB — CBC WITH DIFFERENTIAL/PLATELET
BASOS ABS: 0 10*3/uL (ref 0.0–0.1)
BASOS PCT: 0 %
EOS ABS: 0.1 10*3/uL (ref 0.0–0.7)
Eosinophils Relative: 1 %
HCT: 36.7 % — ABNORMAL LOW (ref 39.0–52.0)
HEMOGLOBIN: 11.4 g/dL — AB (ref 13.0–17.0)
Lymphocytes Relative: 11 %
Lymphs Abs: 1.4 10*3/uL (ref 0.7–4.0)
MCH: 31.5 pg (ref 26.0–34.0)
MCHC: 31.1 g/dL (ref 30.0–36.0)
MCV: 101.4 fL — ABNORMAL HIGH (ref 78.0–100.0)
Monocytes Absolute: 1.1 10*3/uL — ABNORMAL HIGH (ref 0.1–1.0)
Monocytes Relative: 8 %
NEUTROS PCT: 80 %
Neutro Abs: 10.5 10*3/uL — ABNORMAL HIGH (ref 1.7–7.7)
Platelets: 149 10*3/uL — ABNORMAL LOW (ref 150–400)
RBC: 3.62 MIL/uL — AB (ref 4.22–5.81)
RDW: 16.3 % — ABNORMAL HIGH (ref 11.5–15.5)
WBC: 13.1 10*3/uL — AB (ref 4.0–10.5)

## 2016-09-24 LAB — EXPECTORATED SPUTUM ASSESSMENT W GRAM STAIN, RFLX TO RESP C

## 2016-09-24 LAB — FOLATE: FOLATE: 33.8 ng/mL (ref >5.9)

## 2016-09-24 LAB — EXPECTORATED SPUTUM ASSESSMENT W REFEX TO RESP CULTURE

## 2016-09-24 LAB — I-STAT TROPONIN, ED: TROPONIN I, POC: 0.02 ng/mL (ref 0.00–0.08)

## 2016-09-24 LAB — IRON AND TIBC
IRON: 18 ug/dL — AB (ref 45–182)
Saturation Ratios: 6 % — ABNORMAL LOW (ref 17.9–39.5)
TIBC: 322 ug/dL (ref 250–450)
UIBC: 304 ug/dL

## 2016-09-24 LAB — MRSA PCR SCREENING: MRSA BY PCR: NEGATIVE

## 2016-09-24 LAB — FERRITIN: Ferritin: 84 ng/mL (ref 24–336)

## 2016-09-24 LAB — BRAIN NATRIURETIC PEPTIDE: B Natriuretic Peptide: 29.3 pg/mL (ref 0.0–100.0)

## 2016-09-24 LAB — VITAMIN B12: VITAMIN B 12: 917 pg/mL — AB (ref 180–914)

## 2016-09-24 MED ORDER — OMEGA-3-ACID ETHYL ESTERS 1 G PO CAPS
1.0000 g | ORAL_CAPSULE | Freq: Every day | ORAL | Status: DC
  Administered 2016-09-24 – 2016-09-29 (×6): 1 g via ORAL
  Filled 2016-09-24 (×6): qty 1

## 2016-09-24 MED ORDER — BUDESONIDE 0.25 MG/2ML IN SUSP
0.2500 mg | Freq: Two times a day (BID) | RESPIRATORY_TRACT | Status: DC
  Administered 2016-09-24 – 2016-09-29 (×11): 0.25 mg via RESPIRATORY_TRACT
  Filled 2016-09-24 (×11): qty 2

## 2016-09-24 MED ORDER — FUROSEMIDE 40 MG PO TABS
40.0000 mg | ORAL_TABLET | Freq: Two times a day (BID) | ORAL | Status: DC
  Administered 2016-09-24 – 2016-09-29 (×11): 40 mg via ORAL
  Filled 2016-09-24: qty 1
  Filled 2016-09-24: qty 2
  Filled 2016-09-24 (×4): qty 1
  Filled 2016-09-24 (×2): qty 2
  Filled 2016-09-24: qty 1
  Filled 2016-09-24: qty 2
  Filled 2016-09-24 (×2): qty 1

## 2016-09-24 MED ORDER — ACETAMINOPHEN 650 MG RE SUPP: 650.0000 mg | Freq: Four times a day (QID) | RECTAL | Status: DC | PRN

## 2016-09-24 MED ORDER — ORAL CARE MOUTH RINSE
15.0000 mL | Freq: Two times a day (BID) | OROMUCOSAL | Status: DC
  Administered 2016-09-24 – 2016-09-28 (×8): 15 mL via OROMUCOSAL

## 2016-09-24 MED ORDER — IPRATROPIUM BROMIDE 0.02 % IN SOLN
1.0000 mg | Freq: Once | RESPIRATORY_TRACT | Status: AC
  Administered 2016-09-24: 1 mg via RESPIRATORY_TRACT
  Filled 2016-09-24: qty 5

## 2016-09-24 MED ORDER — ONDANSETRON HCL 4 MG/2ML IJ SOLN
4.0000 mg | Freq: Four times a day (QID) | INTRAMUSCULAR | Status: DC | PRN
Start: 2016-09-24 — End: 2016-09-29

## 2016-09-24 MED ORDER — METOPROLOL SUCCINATE ER 25 MG PO TB24
25.0000 mg | ORAL_TABLET | Freq: Every day | ORAL | Status: DC
Start: 2016-09-24 — End: 2016-09-29
  Administered 2016-09-24 – 2016-09-29 (×6): 25 mg via ORAL
  Filled 2016-09-24 (×6): qty 1

## 2016-09-24 MED ORDER — BUDESONIDE 0.5 MG/2ML IN SUSP
0.2500 mg | Freq: Two times a day (BID) | RESPIRATORY_TRACT | Status: DC
  Filled 2016-09-24: qty 2

## 2016-09-24 MED ORDER — POLYETHYLENE GLYCOL 3350 17 G PO PACK: 17.0000 g | PACK | Freq: Every day | ORAL | Status: DC | PRN

## 2016-09-24 MED ORDER — LISINOPRIL 20 MG PO TABS
10.0000 mg | ORAL_TABLET | Freq: Every day | ORAL | Status: DC
  Administered 2016-09-24 – 2016-09-29 (×6): 10 mg via ORAL
  Filled 2016-09-24: qty 1
  Filled 2016-09-24: qty 4
  Filled 2016-09-24 (×6): qty 1

## 2016-09-24 MED ORDER — MAGNESIUM SULFATE 2 GM/50ML IV SOLN
2.0000 g | Freq: Once | INTRAVENOUS | Status: AC
  Administered 2016-09-24: 2 g via INTRAVENOUS
  Filled 2016-09-24: qty 50

## 2016-09-24 MED ORDER — ACETAMINOPHEN 325 MG PO TABS: 650.0000 mg | ORAL_TABLET | Freq: Four times a day (QID) | ORAL | Status: DC | PRN

## 2016-09-24 MED ORDER — SODIUM CHLORIDE 0.9% FLUSH
3.0000 mL | Freq: Two times a day (BID) | INTRAVENOUS | Status: DC
  Administered 2016-09-24 – 2016-09-29 (×12): 3 mL via INTRAVENOUS

## 2016-09-24 MED ORDER — LORAZEPAM 2 MG/ML IJ SOLN
0.5000 mg | INTRAMUSCULAR | Status: DC | PRN
  Administered 2016-09-27: 1 mg via INTRAVENOUS
  Filled 2016-09-24: qty 1

## 2016-09-24 MED ORDER — LEVOFLOXACIN IN D5W 750 MG/150ML IV SOLN
750.0000 mg | Freq: Every day | INTRAVENOUS | Status: DC
  Administered 2016-09-24 – 2016-09-25 (×2): 750 mg via INTRAVENOUS
  Filled 2016-09-24 (×2): qty 150

## 2016-09-24 MED ORDER — FUROSEMIDE 10 MG/ML IJ SOLN
40.0000 mg | Freq: Once | INTRAMUSCULAR | Status: AC
  Administered 2016-09-24: 40 mg via INTRAVENOUS
  Filled 2016-09-24: qty 4

## 2016-09-24 MED ORDER — SIMVASTATIN 20 MG PO TABS
20.0000 mg | ORAL_TABLET | Freq: Every day | ORAL | Status: DC
  Administered 2016-09-24 – 2016-09-28 (×5): 20 mg via ORAL
  Filled 2016-09-24 (×6): qty 1

## 2016-09-24 MED ORDER — MORPHINE SULFATE (PF) 4 MG/ML IV SOLN: 1.0000 mg | INTRAVENOUS | Status: DC | PRN

## 2016-09-24 MED ORDER — BUDESONIDE 0.25 MG/2ML IN SUSP: 0.2500 mg | Freq: Two times a day (BID) | RESPIRATORY_TRACT | Status: DC

## 2016-09-24 MED ORDER — ISOSORBIDE MONONITRATE ER 30 MG PO TB24
30.0000 mg | ORAL_TABLET | Freq: Every day | ORAL | Status: DC
  Administered 2016-09-24 – 2016-09-29 (×6): 30 mg via ORAL
  Filled 2016-09-24 (×6): qty 1

## 2016-09-24 MED ORDER — SALINE SPRAY 0.65 % NA SOLN: 1.0000 | NASAL | Status: DC | PRN

## 2016-09-24 MED ORDER — ONDANSETRON HCL 4 MG PO TABS: 4.0000 mg | ORAL_TABLET | Freq: Four times a day (QID) | ORAL | Status: DC | PRN

## 2016-09-24 MED ORDER — SODIUM CHLORIDE 0.9% FLUSH
3.0000 mL | Freq: Two times a day (BID) | INTRAVENOUS | Status: DC
  Administered 2016-09-24 – 2016-09-28 (×6): 3 mL via INTRAVENOUS

## 2016-09-24 MED ORDER — ALBUTEROL (5 MG/ML) CONTINUOUS INHALATION SOLN
20.0000 mg/h | INHALATION_SOLUTION | Freq: Once | RESPIRATORY_TRACT | Status: AC
  Administered 2016-09-24: 20 mg/h via RESPIRATORY_TRACT
  Filled 2016-09-24: qty 20

## 2016-09-24 MED ORDER — METHYLPREDNISOLONE SODIUM SUCC 125 MG IJ SOLR
125.0000 mg | Freq: Once | INTRAMUSCULAR | Status: AC
  Administered 2016-09-24: 125 mg via INTRAVENOUS
  Filled 2016-09-24: qty 2

## 2016-09-24 MED ORDER — IPRATROPIUM-ALBUTEROL 0.5-2.5 (3) MG/3ML IN SOLN
3.0000 mL | RESPIRATORY_TRACT | Status: DC | PRN
Start: 2016-09-24 — End: 2016-09-25
  Administered 2016-09-25 (×2): 3 mL via RESPIRATORY_TRACT
  Filled 2016-09-24 (×2): qty 3

## 2016-09-24 MED ORDER — SODIUM CHLORIDE 0.9 % IV SOLN: 250.0000 mL | INTRAVENOUS | Status: DC | PRN

## 2016-09-24 MED ORDER — ADULT MULTIVITAMIN W/MINERALS CH
1.0000 | ORAL_TABLET | Freq: Every day | ORAL | Status: DC
  Administered 2016-09-24 – 2016-09-29 (×6): 1 via ORAL
  Filled 2016-09-24 (×6): qty 1

## 2016-09-24 MED ORDER — ASPIRIN EC 81 MG PO TBEC
81.0000 mg | DELAYED_RELEASE_TABLET | Freq: Every day | ORAL | Status: DC
  Administered 2016-09-24 – 2016-09-28 (×5): 81 mg via ORAL
  Filled 2016-09-24 (×5): qty 1

## 2016-09-24 MED ORDER — SODIUM CHLORIDE 0.9% FLUSH
3.0000 mL | INTRAVENOUS | Status: DC | PRN
Start: 2016-09-24 — End: 2016-09-29

## 2016-09-24 MED ORDER — ENOXAPARIN SODIUM 40 MG/0.4ML ~~LOC~~ SOLN
40.0000 mg | Freq: Every day | SUBCUTANEOUS | Status: DC
  Administered 2016-09-24 – 2016-09-29 (×6): 40 mg via SUBCUTANEOUS
  Filled 2016-09-24 (×6): qty 0.4

## 2016-09-24 MED ORDER — METHYLPREDNISOLONE SODIUM SUCC 125 MG IJ SOLR
60.0000 mg | Freq: Four times a day (QID) | INTRAMUSCULAR | Status: DC
  Administered 2016-09-24 – 2016-09-27 (×13): 60 mg via INTRAVENOUS
  Filled 2016-09-24 (×13): qty 2

## 2016-09-24 MED ORDER — CHLORHEXIDINE GLUCONATE 0.12 % MT SOLN
15.0000 mL | Freq: Two times a day (BID) | OROMUCOSAL | Status: DC
  Administered 2016-09-24 – 2016-09-29 (×10): 15 mL via OROMUCOSAL
  Filled 2016-09-24 (×8): qty 15

## 2016-09-24 NOTE — Progress Notes (Signed)
Pt taken off bipap for trial. Placed on 5lt FIO2. Pt SATs dropped to 85% with increased WOB. Pt placed back on BIPAP at previous settings.

## 2016-09-24 NOTE — Evaluation (Signed)
Physical Therapy Evaluation Patient Details Name: [name] MRN: [id number] DOB: 1946/11/01 Today's Date: 09/24/2016   History of Present Illness  Pt adm with acute on chronic respiratory failure and COPD exacerbation. Pt required bipap. PMH - COPD, bladder CA, HTN, CAD, anxiety,   Clinical Impression  Pt admitted with above diagnosis and presents to PT with functional limitations due to deficits listed below (See PT problem list). Pt needs skilled PT to maximize independence and safety to allow discharge to home. Expect pt's mobility will improve as respiratory status improves.     Follow Up Recommendations No PT follow up    Equipment Recommendations  None recommended by PT    Recommendations for Other Services       Precautions / Restrictions Precautions Precautions: Other (comment) Precaution Comments: watch SpO2 Restrictions Weight Bearing Restrictions: No      Mobility  Bed Mobility Overal bed mobility: Modified Independent                Transfers Overall transfer level: Needs assistance Equipment used: None Transfers: Sit to/from Stand Sit to Stand: Min guard         General transfer comment: Assist for safety  Ambulation/Gait Ambulation/Gait assistance: Min guard Ambulation Distance (Feet): 3 Feet Assistive device: None Gait Pattern/deviations: Step-through pattern;Decreased stride length Gait velocity: decr Gait velocity interpretation: Below normal speed for age/gender General Gait Details: Assist for safety and balance. Unsteady. Pt with SpO2 86-90% on 5l of O2.  Stairs            Wheelchair Mobility    Modified Rankin (Stroke Patients Only)       Balance Overall balance assessment: Needs assistance Sitting-balance support: No upper extremity supported;Feet supported Sitting balance-Leahy Scale: Good     Standing balance support: No upper extremity supported Standing balance-Leahy Scale: Fair                                Pertinent Vitals/Pain Pain Assessment: No/denies pain    Home Living Family/patient expects to be discharged to:: Private residence Living Arrangements: Spouse/significant other Available Help at Discharge: Family;Available 24 hours/day Type of Home: House Home Access: Stairs to enter Entrance Stairs-Rails: Right;Left;Can reach both Entrance Stairs-Number of Steps: 3 Home Layout: One level Home Equipment: Cane - single point;Grab bars - tub/shower;Walker - 2 wheels      Prior Function Level of Independence: Independent               Hand Dominance   Dominant Hand: Right    Extremity/Trunk Assessment   Upper Extremity Assessment: Defer to OT evaluation           Lower Extremity Assessment: Overall WFL for tasks assessed         Communication   Communication: No difficulties  Cognition Arousal/Alertness: Awake/alert Behavior During Therapy: WFL for tasks assessed/performed Overall Cognitive Status: Within Functional Limits for tasks assessed                      General Comments      Exercises     Assessment/Plan    PT Assessment Patient needs continued PT services  PT Problem List Decreased activity tolerance;Decreased balance;Decreased mobility;Cardiopulmonary status limiting activity          PT Treatment Interventions DME instruction;Gait training;Functional mobility training;Balance training;Patient/family education    PT Goals (Current goals can be found in the Care Plan section)  Acute  Rehab PT Goals Patient Stated Goal: return home PT Goal Formulation: With patient Time For Goal Achievement: 10/01/16 Potential to Achieve Goals: Good    Frequency Min 3X/week   Barriers to discharge        Co-evaluation               End of Session Equipment Utilized During Treatment: Gait belt Activity Tolerance: Other (comment) (dyspnea ) Patient left: in chair;with call bell/phone within reach Nurse  Communication: Mobility status         Time: 2353-6144 PT Time Calculation (min) (ACUTE ONLY): 20 min   Charges:   PT Evaluation $PT Eval Moderate Complexity: 1 Procedure     PT G Codes:        [name] 2016-10-14, 1:35 PM [facility] [phone]

## 2016-09-24 NOTE — ED Provider Notes (Signed)
By signing my name below, I, Maud Deed. Royston Sinner, attest that this documentation has been prepared under the direction and in the presence of [place], DO.  Electronically Signed: Maud Deed. Royston Sinner, ED Scribe. 09/24/16. 12:17 AM.   TIME SEEN: 12:04 AM   CHIEF COMPLAINT:  Chief Complaint  Patient presents with  . Respiratory Distress    HPI: HPI Comments: [name], brought in by EMS is a 70 y.o. male with a PMHx of COPD, CAD, emphysema, and HTN who presents to the Emergency Department complaining of sudden onset, constant shortness of breath onset 10:30 PM this evening shortly after going to bed. Shortness of breath is exacerbated when supine and mildly improved when sitting up. En route to department, pt was placed on CPAP which he could not tolerate. Albuterol and Atrovent administered without any significant improvement per EMS. Pt denies any pain at this time. No chest pain. He is typically on 4 liters of oxygen at home.  PCP: [name], MD    ROS: See HPI Constitutional: no fever  Eyes: no drainage  ENT: no runny nose   Cardiovascular:  no chest pain  Resp: SOB  GI: no vomiting GU: no dysuria Integumentary: no rash  Allergy: no hives  Musculoskeletal: no leg swelling  Neurological: no slurred speech ROS otherwise negative  PAST MEDICAL HISTORY/PAST SURGICAL HISTORY:  Past Medical History:  Diagnosis Date  . Anxiety   . Bladder cancer ([place]) 2011  . Borderline diabetes mellitus   . CAD (coronary artery disease)   . Cigarette smoker   . COPD (chronic obstructive pulmonary disease) ([place])   . DJD (degenerative joint disease)   . Emphysema of lung ([place])   . Epistaxis   . History of colonic polyps 2004   hyperplastic   . History of sinusitis   . Hypercholesteremia   . Hypertension   . Obesity     MEDICATIONS:  Prior to Admission medications   Medication Sig Start Date End Date Taking? Authorizing Provider  albuterol (PROVENTIL HFA;VENTOLIN HFA) 108  (90 BASE) MCG/ACT inhaler Inhale 2 puffs into the lungs 4 (four) times daily as needed for wheezing (for use when not at home / unable to use nebulizer). 10/29/15   Cherene Altes, MD  Albuterol Sulfate (PROAIR RESPICLICK) 563 (90 Base) MCG/ACT AEPB Inhale 2 puffs into the lungs every 6 (six) hours as needed. 05/05/16   Noralee Space, MD  ALPRAZolam Duanne Moron) 0.5 MG tablet Take 0.5 mg by mouth 3 (three) times daily as needed for anxiety or sleep.    Historical Provider, MD  aspirin 81 MG tablet Take 81 mg by mouth at bedtime.     Historical Provider, MD  budesonide (PULMICORT) 0.25 MG/2ML nebulizer solution Take 2 mLs (0.25 mg total) by nebulization 2 (two) times daily. 03/16/16   Noralee Space, MD  Flaxseed, Linseed, (FLAXSEED OIL) 1000 MG CAPS Take 1 capsule by mouth daily.      Historical Provider, MD  furosemide (LASIX) 40 MG tablet Take 40 mg by mouth 2 (two) times daily.    Historical Provider, MD  ibuprofen (ADVIL,MOTRIN) 600 MG tablet Take 600 mg by mouth every 6 (six) hours as needed for moderate pain.    Historical Provider, MD  ipratropium-albuterol (DUONEB) 0.5-2.5 (3) MG/3ML SOLN Take 3 mLs by nebulization 4 (four) times daily. 03/16/16   Noralee Space, MD  isosorbide mononitrate (IMDUR) 30 MG 24 hr tablet Take 1 tablet (30 mg total) by mouth daily. 03/16/16  Scott Joylene Draft, PA-C  levofloxacin (LEVAQUIN) 500 MG tablet Take 1 tablet (500 mg total) by mouth daily. 08/05/16   Noralee Space, MD  lisinopril (PRINIVIL,ZESTRIL) 10 MG tablet Take 1 tablet (10 mg total) by mouth daily. 10/29/15   Cherene Altes, MD  metoprolol succinate (TOPROL-XL) 25 MG 24 hr tablet TAKE 2 TABLETS BY MOUTH IN THE MORNING AND 1 TABLET BY MOUTH IN THE THE EVENING    Historical Provider, MD  Multiple Vitamin (MULTIVITAMIN) capsule Take 1 capsule by mouth daily.      Historical Provider, MD  nitroGLYCERIN (NITROSTAT) 0.4 MG SL tablet Place 1 tablet (0.4 mg total) under the tongue every 5 (five) minutes as needed for  chest pain. 11/19/15   Liliane Shi, PA-C  Omega-3 Fatty Acids (FISH OIL) 1200 MG CAPS Take 1 capsule by mouth daily.      Historical Provider, MD  predniSONE (DELTASONE) 20 MG tablet Take as directed 08/05/16   Noralee Space, MD  simvastatin (ZOCOR) 20 MG tablet Take 1 tablet (20 mg total) by mouth daily at 6 PM. 11/19/15   Liliane Shi, PA-C  sodium chloride (OCEAN) 0.65 % SOLN nasal spray Place 1 spray into both nostrils as needed for congestion. 10/29/15   Cherene Altes, MD    ALLERGIES:  No Known Allergies  SOCIAL HISTORY:  Social History  Substance Use Topics  . Smoking status: Former Smoker    Packs/day: 2.00    Years: 45.00    Types: Cigarettes    Quit date: 10/17/2015  . Smokeless tobacco: Never Used  . Alcohol use 0.0 oz/week     Comment: glass of wine at night    FAMILY HISTORY: Family History  Problem Relation Age of Onset  . Heart disease Mother   . Heart disease Brother   . Cancer Brother   . Aneurysm Brother     EXAM: BP 128/68   Pulse (!) 122   Temp 97.9 F (36.6 C)   Resp 19   SpO2 100%  CONSTITUTIONAL: chronically ill appearing in severe respitatory distress; afebrile, nontoxic HEAD: Normocephalic EYES: Conjunctivae clear, PERRL, EOMI ENT: normal nose; no rhinorrhea; moist mucous membranes NECK: Supple, no meningismus, no nuchal rigidity, no LAD  CARD: Regular and tachycardic; S1 and S2 appreciated; no murmurs, no clicks, no rubs, no gallops RESP: tachyneic, speaking in short sentences, severe respiratory distress, significantly diminished aeration diffusely, no rhonchi or rales or wheezing noted. ABD/GI: Normal bowel sounds; non-distended; soft, non-tender, no rebound, no guarding, no peritoneal signs, no hepatosplenomegaly BACK:  The back appears normal and is non-tender to palpation, there is no CVA tenderness EXT: Normal ROM in all joints; non-tender to palpation; Bilateral lower extremity edema; normal capillary refill; no cyanosis, no calf  tenderness or swelling    SKIN: Normal color for age and race; warm; no rash NEURO: Moves all extremities equally PSYCH: The patient's mood and manner are appropriate. Grooming and personal hygiene are appropriate.  MEDICAL DECISION MAKING: Patient here with respiratory distress. Suspect COPD exacerbation versus CHF. Echocardiogram in 2016 showed EF of 50-55%. ABG shows a respiratory acidosis. No recent infectious symptoms. No chest pain. We'll obtain labs, give albuterol, Atrovent, Solu-Medrol, magnesium.     EKG Interpretation  Date/Time:  Friday September 24 2016 00:25:27 EST Ventricular Rate:  123 PR Interval:    QRS Duration: 91 QT Interval:  311 QTC Calculation: 445 R Axis:   49 Text Interpretation:  Sinus tachycardia Minimal ST depression, lateral leads  Confirmed by WARD,  DO, KRISTEN [phone]) on 09/24/2016 1:04:46 AM       ED PROGRESS: 1:20 AM  Patient reports he is feeling much better. Not able to speak in full sentences. Respiratory rate and tachycardia have improved. He has better aeration on examination. Chest x-ray shows pulmonary edema. He is on Lasix 40 mg twice a day. We'll give 40 mg IV Lasix now.    1:50 AM  Pt continues to improve and would like to be transitioned off of BiPAP. We will attempt to transition him to nasal cannula. He does have a mild leukocytosis but no sign of infection. Suspect this could be reactive. Troponin negative.  2:10 AM  Attempted to transition patient off of BiPAP without success. His oxygen saturation quickly dropped to 85% on 5 L nasal cannula and he had increased work of breathing. Patient was placed back on BiPAP. Doing well again.  Discussed patient's case with hospitalist, Dr. Myna Hidalgo.  Recommend admission to inpatient, stepdown bed.  I will place holding orders per their request. Patient and family (if present) updated with plan. Care transferred to hospitalist service.  I reviewed all nursing notes, vitals, pertinent old records, EKGs,  labs, imaging (as available).   CRITICAL CARE Performed by: Nyra Jabs   Total critical care time: 45 minutes  Critical care time was exclusive of separately billable procedures and treating other patients.  Critical care was necessary to treat or prevent imminent or life-threatening deterioration.  Critical care was time spent personally by me on the following activities: development of treatment plan with patient and/or surrogate as well as nursing, discussions with consultants, evaluation of patient's response to treatment, examination of patient, obtaining history from patient or surrogate, ordering and performing treatments and interventions, ordering and review of laboratory studies, ordering and review of radiographic studies, pulse oximetry and re-evaluation of patient's condition.   I personally performed the services described in this documentation, which was scribed in my presence. The recorded information has been reviewed and is accurate.    [place], DO 09/24/16 [phone]

## 2016-09-24 NOTE — H&P (Addendum)
History and Physical    VAL SCHIAVO VQQ:[id number] DOB: 1946-11-05 DOA: 09/24/2016  PCP: [name], MD   Patient coming from: Home  Chief Complaint: Dyspnea, cough, chills   HPI: [name] is a 70 y.o. male with medical history significant for COPD with chronic requirement for 2-3 L/m of supplemental oxygen, hypertension, coronary artery disease, and obesity who presents to the ED with dyspnea, chills, and increased cough with yellow sputum production. Patient reports that he had been in his usual state of health until several days ago when he began developing a increase in his chronic cough with increased sputum production which has turned yellow. He did not feel particularly short of breath beyond his baseline, however, until tonight, just prior to arrival when he developed acute worsening associated with subjective fevers and chills. He had difficulty catching his breath even while at rest and EMS was activated for transport to the hospital. Patient was given a DuoNeb treatment en route to the hospital and was placed on CPAP briefly but could not tolerate it. He denies any recent long distance travel, prolonged immobilization, or swelling or tenderness of the lower extremity. He denies chest pain or palpitations.  ED Course: Upon arrival to the ED, patient is found to be afebrile, saturating 83% on 5 L/m via nasal cannula, tachypneic, tachycardic in the 120s, and mildly hypertensive. EKG demonstrates a sinus tachycardia with rate 128, PA-C, and minimal ST depression in the lateral leads. Chest x-ray is notable for increased airspace opacities superimposed on a background of his chronic lung disease suggestive of possible pulmonary edema. Chemistry panel is largely unremarkable and CBC is notable for a leukocytosis to 13,100 and a macrocytic anemia with hemoglobin of 11.4 and MCV of 101.4. Troponin and BNP are both within the normal limits. Patient was treated with 40 mg of IV Lasix,  DuoNeb nebs, IV magnesium, and 125 mg IV Solu-Medrol in the emergency department. He was kept on BiPAP. ABG was performed and pH is 7.32 with pCO2 65 and pO2 of 128. Patient was transitioned to 5 L/m via nasal cannula and quickly desaturated to the mid 80s. He was placed back on BiPAP. He will be admitted to the stepdown unit for ongoing evaluation and management of acute on chronic respiratory failure with hypoxia and hypercarbia suspected secondary to acute exacerbation and COPD.  Review of Systems:  All other systems reviewed and apart from HPI, are negative.  Past Medical History:  Diagnosis Date  . Anxiety   . Bladder cancer ([place]) 2011  . Borderline diabetes mellitus   . CAD (coronary artery disease)   . Cigarette smoker   . COPD (chronic obstructive pulmonary disease) ([place])   . DJD (degenerative joint disease)   . Emphysema of lung ([place])   . Epistaxis   . History of colonic polyps 2004   hyperplastic   . History of sinusitis   . Hypercholesteremia   . Hypertension   . Obesity     Past Surgical History:  Procedure Laterality Date  . cataract surgery  septemeber 2013  . cataract surgery/sub posterior vitrectomy in left eye  1996   Dr. Zadie Rhine  . [facility]  [phone]     reports that he quit smoking about 11 months ago. His smoking use included Cigarettes. He has a 90.00 pack-year smoking history. He has never used smokeless tobacco. He reports that he drinks alcohol. He reports that he does not use drugs.  No Known Allergies  Family History  Problem  Relation Age of Onset  . Heart disease Mother   . Heart disease Brother   . Cancer Brother   . Aneurysm Brother      Prior to Admission medications   Medication Sig Start Date End Date Taking? Authorizing Provider  albuterol (PROVENTIL HFA;VENTOLIN HFA) 108 (90 BASE) MCG/ACT inhaler Inhale 2 puffs into the lungs 4 (four) times daily as needed for wheezing (for use when not at home / unable to use nebulizer). 10/29/15    Cherene Altes, MD  Albuterol Sulfate (PROAIR RESPICLICK) 382 (90 Base) MCG/ACT AEPB Inhale 2 puffs into the lungs every 6 (six) hours as needed. 05/05/16   Noralee Space, MD  ALPRAZolam Duanne Moron) 0.5 MG tablet Take 0.5 mg by mouth 3 (three) times daily as needed for anxiety or sleep.    Historical Provider, MD  aspirin 81 MG tablet Take 81 mg by mouth at bedtime.     Historical Provider, MD  budesonide (PULMICORT) 0.25 MG/2ML nebulizer solution Take 2 mLs (0.25 mg total) by nebulization 2 (two) times daily. 03/16/16   Noralee Space, MD  Flaxseed, Linseed, (FLAXSEED OIL) 1000 MG CAPS Take 1 capsule by mouth daily.      Historical Provider, MD  furosemide (LASIX) 40 MG tablet Take 40 mg by mouth 2 (two) times daily.    Historical Provider, MD  ibuprofen (ADVIL,MOTRIN) 600 MG tablet Take 600 mg by mouth every 6 (six) hours as needed for moderate pain.    Historical Provider, MD  ipratropium-albuterol (DUONEB) 0.5-2.5 (3) MG/3ML SOLN Take 3 mLs by nebulization 4 (four) times daily. 03/16/16   Noralee Space, MD  isosorbide mononitrate (IMDUR) 30 MG 24 hr tablet Take 1 tablet (30 mg total) by mouth daily. 03/16/16   Liliane Shi, PA-C  levofloxacin (LEVAQUIN) 500 MG tablet Take 1 tablet (500 mg total) by mouth daily. 08/05/16   Noralee Space, MD  lisinopril (PRINIVIL,ZESTRIL) 10 MG tablet Take 1 tablet (10 mg total) by mouth daily. 10/29/15   Cherene Altes, MD  metoprolol succinate (TOPROL-XL) 25 MG 24 hr tablet TAKE 2 TABLETS BY MOUTH IN THE MORNING AND 1 TABLET BY MOUTH IN THE THE EVENING    Historical Provider, MD  Multiple Vitamin (MULTIVITAMIN) capsule Take 1 capsule by mouth daily.      Historical Provider, MD  nitroGLYCERIN (NITROSTAT) 0.4 MG SL tablet Place 1 tablet (0.4 mg total) under the tongue every 5 (five) minutes as needed for chest pain. 11/19/15   Liliane Shi, PA-C  Omega-3 Fatty Acids (FISH OIL) 1200 MG CAPS Take 1 capsule by mouth daily.      Historical Provider, MD  predniSONE  (DELTASONE) 20 MG tablet Take as directed 08/05/16   Noralee Space, MD  simvastatin (ZOCOR) 20 MG tablet Take 1 tablet (20 mg total) by mouth daily at 6 PM. 11/19/15   Liliane Shi, PA-C  sodium chloride (OCEAN) 0.65 % SOLN nasal spray Place 1 spray into both nostrils as needed for congestion. 10/29/15   Cherene Altes, MD    Physical Exam: Vitals:   09/24/16 0100 09/24/16 0112 09/24/16 0130 09/24/16 0145  BP: 128/68  139/61 153/60  Pulse: 118 (!) 122 118 (!) 123  Resp: '19  18 23  '$ Temp:      SpO2: 97% 100% 94% 90%      Constitutional: In acute respiratory distress with accessory muscle recruitment, tachypnea, no pallor  Eyes: PERTLA, lids and conjunctivae normal ENMT: Mucous membranes are  moist. Posterior pharynx clear of any exudate or lesions.   Neck: normal, supple, no masses, no thyromegaly Respiratory: Markedly diminished bilaterally with occasional expiratory wheeze. Increased WOB.  Cardiovascular: Rate ~120 and regular. Trace pretibial edema bilaterally. No significant JVD. Abdomen: No distension, no tenderness, no masses palpated. Bowel sounds normal.  Musculoskeletal: no clubbing / cyanosis. No joint deformity upper and lower extremities. No calf tenderness or swelling.  Skin: no significant rashes, lesions, ulcers. Warm, dry, well-perfused. Neurologic: CN 2-12 grossly intact. Sensation intact, DTR normal. Strength 5/5 in all 4 limbs.  Psychiatric: Normal judgment and insight. Alert and oriented x 3. Anxious.     Labs on Admission: I have personally reviewed following labs and imaging studies  CBC:  Recent Labs Lab 09/24/16 0011  WBC 13.1*  NEUTROABS 10.5*  HGB 11.4*  HCT 36.7*  MCV 101.4*  PLT 235*   Basic Metabolic Panel:  Recent Labs Lab 09/24/16 0011  NA 139  K 3.8  CL 100*  CO2 30  GLUCOSE 166*  BUN 10  CREATININE 0.74  CALCIUM 9.0   GFR: CrCl cannot be calculated (Unknown ideal weight.). Liver Function Tests: No results for input(s): AST,  ALT, ALKPHOS, BILITOT, PROT, ALBUMIN in the last 168 hours. No results for input(s): LIPASE, AMYLASE in the last 168 hours. No results for input(s): AMMONIA in the last 168 hours. Coagulation Profile: No results for input(s): INR, PROTIME in the last 168 hours. Cardiac Enzymes: No results for input(s): CKTOTAL, CKMB, CKMBINDEX, TROPONINI in the last 168 hours. BNP (last 3 results) No results for input(s): PROBNP in the last 8760 hours. HbA1C: No results for input(s): HGBA1C in the last 72 hours. CBG: No results for input(s): GLUCAP in the last 168 hours. Lipid Profile: No results for input(s): CHOL, HDL, LDLCALC, TRIG, CHOLHDL, LDLDIRECT in the last 72 hours. Thyroid Function Tests: No results for input(s): TSH, T4TOTAL, FREET4, T3FREE, THYROIDAB in the last 72 hours. Anemia Panel: No results for input(s): VITAMINB12, FOLATE, FERRITIN, TIBC, IRON, RETICCTPCT in the last 72 hours. Urine analysis:    Component Value Date/Time   COLORURINE LT. YELLOW 06/30/2010 1050   APPEARANCEUR CLEAR 06/30/2010 1050   LABSPEC <=1.005 06/30/2010 1050   PHURINE 7.0 06/30/2010 1050   GLUCOSEU NEGATIVE 06/30/2010 1050   BILIRUBINUR NEGATIVE 06/30/2010 1050   KETONESUR NEGATIVE 06/30/2010 1050   UROBILINOGEN 0.2 06/30/2010 1050   NITRITE NEGATIVE 06/30/2010 1050   LEUKOCYTESUR NEGATIVE 06/30/2010 1050   Sepsis Labs: '@LABRCNTIP'$ (procalcitonin:4,lacticidven:4) )No results found for this or any previous visit (from the past 240 hour(s)).   Radiological Exams on Admission: Dg Chest Portable 1 View  Result Date: 09/24/2016 CLINICAL DATA:  Shortness of breath.  Respiratory distress. EXAM: PORTABLE CHEST 1 VIEW COMPARISON:  Chest radiograph 10/20/2015 FINDINGS: Bilateral upper lobe bullous disease is again seen. There are diffuse reticulonodular opacities in the lung bases compatible with a degree of pulmonary fibrosis. Increased opacity in these areas suggest a degree of superimposed pulmonary edema.  Cardiomediastinal contours are unchanged with persistent atherosclerotic calcification in the aortic arch. No pneumothorax or sizable pleural effusion. IMPRESSION: 1. Increased airspace opacities superimposed on a background of chronic lung disease, suggestive of pulmonary edema. 2. Aortic atherosclerosis. 3. Bilateral upper lobe bullous disease. Electronically Signed   By: Ulyses Jarred M.D.   On: 09/24/2016 00:49    EKG: Independently reviewed. Sinus tachycardia (rate 128), PAC, minimal ST-depression in lateral leads  Assessment/Plan  1. COPD exacerbation, acute on chronic hypoxic/hypercarbic respiratory failure  - Pt has COPD  with 2-3 Lpm O2 requirement at baseline; he presents in acute exacerbation with a suspected infectious precipitant  - He was treated with nebs en route, placed on BiPAP in ED and treated with 2 g IV mag, 125 mg IV Solu-Medrol, nebs, and IV Lasix  - Plan to admit to stepdown on BiPAP, continue Pulmicort, Solu-Medrol 60 mg IV q6h, DuoNeb, check sputum culture and gram-stain and start Levaquin  - Check respiratory virus panel and maintain airborne precautions while awaiting results  - Repeat ABG in 2 hrs, sooner for any worsening, and transition off of BiPAP as tolerated    2. CAD  - EKG features borderline ST-depressions in lateral leads on admission; likely secondary to tachycardia, hypoxia, and increased WOB - No chest pain, troponin wnl  - Monitor on telemetry and resume Zocor, Toprol, ASA 81, lisinopril, Imdur as tolerated once off of BiPAP  3. Hypertension  - At goal currently  - Managed with lisinopril and Toprol at home; plan to resume as tolerated once off of BiPAP   4. Anxiety  - Managed with Xanax prn at home  - Ativan IV available prn for now while on BiPAP    5. Macrocytic anemia  - Hgb 11.4 on admission; previously in 14-range  - MCV is 101.4 - No s/s of bleeding; will check anemia panel   6. ?CHF  - TTE in December 2016 with preserved EF and no  diastolic dysfunction identified  - He takes Lasix 40 mg BID at home - Admission CXR features interstitial prominence possibly representing edema and he was given Lasix 40 mg IV in the ED  - Follow strict I/Os and daily wts, SLIV - Clinically, pt does not appear vol o/l on admission; plan to resume his home-dose Lasix once off of BiPAP    DVT prophylaxis: sq Lovenox  Code Status: Full  Family Communication: Wife updated at bedside Disposition Plan: Admit to stepdown Consults called: None Admission status: Inpatient    Vianne Bulls, MD [facility] Pager [phone]  If 7PM-7AM, please contact night-coverage [email or web address] Password [email or web address]  09/24/2016, 2:46 AM

## 2016-09-24 NOTE — Progress Notes (Signed)
Patient refuses for RT to tighten bi-pap mask down any tighter. Patient continue's to have a high leak.

## 2016-09-24 NOTE — Progress Notes (Signed)
Nutrition Consult/Brief Note  RD consulted per COPD Gold Protocol.  Wt Readings from Last 15 Encounters:  09/24/16 242 lb 8.1 oz (110 kg)  08/05/16 243 lb (110.2 kg)  06/01/16 244 lb 14.9 oz (111.1 kg)  05/20/16 232 lb 9.4 oz (105.5 kg)  05/13/16 236 lb 12.4 oz (107.4 kg)  05/06/16 233 lb 7.5 oz (105.9 kg)  05/05/16 235 lb (106.6 kg)  05/04/16 235 lb 10.8 oz (106.9 kg)  04/29/16 234 lb 12.6 oz (106.5 kg)  04/27/16 235 lb 7.2 oz (106.8 kg)  04/22/16 235 lb 7.2 oz (106.8 kg)  04/20/16 235 lb 14.3 oz (107 kg)  04/16/16 233 lb (105.7 kg)  04/08/16 234 lb 2.1 oz (106.2 kg)  04/01/16 233 lb 7.5 oz (105.9 kg)    Body mass index is 39.14 kg/m. Patient meets criteria for Obesity Class II based on current BMI.   Current diet order is NPO.   Currently on BiPAP.  Pt reports a good appetite PTA.   Labs and medications reviewed.   No nutrition interventions warranted at this time. If nutrition issues arise, please consult RD.   Arthur Holms, RD, LDN Pager #: [phone] After-Hours Pager #: [phone]

## 2016-09-24 NOTE — Progress Notes (Signed)
Responded to consult for prayer and to create advanced directive. Provided spiritual/emotional support and prayer to pt and daughter. Discussed advanced directive options with pt, daughter, and, briefly, with just-arrived wife. Explained process -- that they should ask nurse to page again when they had discussed and pt was ready to execute it. Chaplain available for follow-up.    09/24/16 1200  Clinical Encounter Type  Visited With Patient and family together  Visit Type Initial  Referral From Nurse  Spiritual Encounters  Spiritual Needs Brochure;Prayer;Emotional  Stress Factors  Patient Stress Factors Health changes;Loss of control  Family Stress Factors Family relationships;Health changes;Loss of control  Advance Directives (For Healthcare)  Does patient have an advance directive? No  Does patient want to make changes to advanced directive? Yes - information given

## 2016-09-24 NOTE — ED Triage Notes (Signed)
Pt comes via [facility], was going to bed around 1030, became very SOB, hx of COPD, wears home o2 at 3L, wheeziong all over, abd breathing, pt did not tolerate cpap, pta received 10 albuterol, 0.5 atrovent

## 2016-09-24 NOTE — Progress Notes (Signed)
PROGRESS NOTE    [name]  UMP:[id number] DOB: 02-Jan-1946 DOA: 09/24/2016 PCP: [name], MD   Brief Narrative: 70 y.o. male with medical history significant for COPD with chronic hypoxic respiratory failure requiring 4 L of oxygen at home, hypertension, coronary artery disease, and obesity who presents to the ED with dyspnea, chills, and increased cough with yellow sputum production.  Assessment & Plan:   #  COPD exacerbation ([place]), acute on chronic hypoxic/hypercapnic respiratory failure: -Patient reported feeling much better today. He is requiring about 5 L of oxygen to maintain saturation. Continue IV Solu-Medrol, Levaquin, nebulization. -Follow-up respiratory viral panel. -BiPAP as needed. -Continue supportive treatment.  #Essential hypertension: Blood pressure elevated. Currently on Lasix, Imdur, lisinopril and metoprolol. Monitor blood pressure closely.  #History of coronary artery disease: Continue aspirin, statin, beta blocker.  #History of dilated cardiomyopathy as per patient chart: As per echocardiogram from December 2016 patient has normal left ventricular systolic function. Patient looks euvolemic on physical exam. BNP not elevated. I do not think patient is having CHF this time. Continue home dose of oral Lasix.  Continue current supportive care.  DVT prophylaxis: Lovenox subcutaneous Code Status: Full code Family Communication: No family present at bedside Disposition Plan: Likely discharge home in 1-2 days    Consultants:   None  Procedures: Requiring BiPAP as needed. Antimicrobials: Levaquin since November 10  Subjective: Patient was seen and examined at bedside. Patient is still has shortness of breath however feels much better than yesterday. Has dry cough. Denied fever, chills, chest pain or nausea, vomiting or abdominal pain. Denied any urinary or bowel symptoms.   Objective: Vitals:   09/24/16 0757 09/24/16 0806 09/24/16 0818 09/24/16  1138  BP: 128/63  (!) 157/67 (!) 160/76  Pulse: (!) 102  98 (!) 102  Resp: (!) 22  (!) 22 (!) 21  Temp:   98.9 F (37.2 C)   TempSrc:   Axillary   SpO2: 97% 100% 96% 96%  Weight:      Height:        Intake/Output Summary (Last 24 hours) at 09/24/16 1227 Last data filed at 09/24/16 1052  Gross per 24 hour  Intake              203 ml  Output              550 ml  Net             -347 ml   Filed Weights   09/24/16 0436  Weight: 110 kg (242 lb 8.1 oz)    Examination:  General exam: Not in distress, sitting on chair comfortably.  Respiratory system: Bilateral diffuse expiratory wheeze, no crackle Cardiovascular system: S1 & S2 heard, RRR.  No pedal edema. Gastrointestinal system: Abdomen is nondistended, soft and nontender. Normal bowel sounds heard. Central nervous system: Alert and oriented. No focal neurological deficits. Extremities: Symmetric 5 x 5 power. Skin: No rashes, lesions or ulcers Psychiatry: Judgement and insight appear normal. Mood & affect appropriate.     Data Reviewed: I have personally reviewed following labs and imaging studies  CBC:  Recent Labs Lab 09/24/16 0011  WBC 13.1*  NEUTROABS 10.5*  HGB 11.4*  HCT 36.7*  MCV 101.4*  PLT 400*   Basic Metabolic Panel:  Recent Labs Lab 09/24/16 0011  NA 139  K 3.8  CL 100*  CO2 30  GLUCOSE 166*  BUN 10  CREATININE 0.74  CALCIUM 9.0   GFR: Estimated Creatinine Clearance: 98.6 mL/min (  by C-G formula based on SCr of 0.74 mg/dL). Liver Function Tests: No results for input(s): AST, ALT, ALKPHOS, BILITOT, PROT, ALBUMIN in the last 168 hours. No results for input(s): LIPASE, AMYLASE in the last 168 hours. No results for input(s): AMMONIA in the last 168 hours. Coagulation Profile: No results for input(s): INR, PROTIME in the last 168 hours. Cardiac Enzymes: No results for input(s): CKTOTAL, CKMB, CKMBINDEX, TROPONINI in the last 168 hours. BNP (last 3 results) No results for input(s): PROBNP  in the last 8760 hours. HbA1C: No results for input(s): HGBA1C in the last 72 hours. CBG: No results for input(s): GLUCAP in the last 168 hours. Lipid Profile: No results for input(s): CHOL, HDL, LDLCALC, TRIG, CHOLHDL, LDLDIRECT in the last 72 hours. Thyroid Function Tests: No results for input(s): TSH, T4TOTAL, FREET4, T3FREE, THYROIDAB in the last 72 hours. Anemia Panel:  Recent Labs  09/24/16 0309  VITAMINB12 917*  FOLATE 33.8  FERRITIN 84  TIBC 322  IRON 18*  RETICCTPCT 6.3*   Sepsis Labs: No results for input(s): PROCALCITON, LATICACIDVEN in the last 168 hours.  Recent Results (from the past 240 hour(s))  MRSA PCR Screening     Status: None   Collection Time: 09/24/16  4:41 AM  Result Value Ref Range Status   MRSA by PCR NEGATIVE NEGATIVE Final    Comment:        The GeneXpert MRSA Assay (FDA approved for NASAL specimens only), is one component of a comprehensive MRSA colonization surveillance program. It is not intended to diagnose MRSA infection nor to guide or monitor treatment for MRSA infections.   Culture, sputum-assessment     Status: None   Collection Time: 09/24/16  5:32 AM  Result Value Ref Range Status   Specimen Description EXPECTORATED SPUTUM  Final   Special Requests NONE  Final   Sputum evaluation THIS SPECIMEN IS ACCEPTABLE FOR SPUTUM CULTURE  Final   Report Status 09/24/2016 FINAL  Final  Culture, respiratory (NON-Expectorated)     Status: None (Preliminary result)   Collection Time: 09/24/16  5:32 AM  Result Value Ref Range Status   Specimen Description SPU  Final   Special Requests NONE  Final   Gram Stain   Final    MODERATE WBC PRESENT, PREDOMINANTLY PMN FEW SQUAMOUS EPITHELIAL CELLS PRESENT MODERATE GRAM POSITIVE COCCI IN PAIRS IN CLUSTERS FEW GRAM NEGATIVE RODS FEW GRAM POSITIVE RODS    Culture PENDING  Incomplete   Report Status PENDING  Incomplete         Radiology Studies: Dg Chest Portable 1 View  Result Date:  09/24/2016 CLINICAL DATA:  Shortness of breath.  Respiratory distress. EXAM: PORTABLE CHEST 1 VIEW COMPARISON:  Chest radiograph 10/20/2015 FINDINGS: Bilateral upper lobe bullous disease is again seen. There are diffuse reticulonodular opacities in the lung bases compatible with a degree of pulmonary fibrosis. Increased opacity in these areas suggest a degree of superimposed pulmonary edema. Cardiomediastinal contours are unchanged with persistent atherosclerotic calcification in the aortic arch. No pneumothorax or sizable pleural effusion. IMPRESSION: 1. Increased airspace opacities superimposed on a background of chronic lung disease, suggestive of pulmonary edema. 2. Aortic atherosclerosis. 3. Bilateral upper lobe bullous disease. Electronically Signed   By: Ulyses Jarred M.D.   On: 09/24/2016 00:49        Scheduled Meds: . aspirin EC  81 mg Oral QHS  . budesonide (PULMICORT) nebulizer solution  0.25 mg Nebulization BID  . chlorhexidine  15 mL Mouth Rinse BID  . enoxaparin (  LOVENOX) injection  40 mg Subcutaneous Daily  . furosemide  40 mg Oral BID  . isosorbide mononitrate  30 mg Oral Daily  . levofloxacin (LEVAQUIN) IV  750 mg Intravenous Q0600  . lisinopril  10 mg Oral Daily  . mouth rinse  15 mL Mouth Rinse q12n4p  . methylPREDNISolone (SOLU-MEDROL) injection  60 mg Intravenous Q6H  . metoprolol succinate  25 mg Oral Daily  . multivitamin with minerals  1 tablet Oral Daily  . omega-3 acid ethyl esters  1 g Oral Daily  . simvastatin  20 mg Oral q1800  . sodium chloride flush  3 mL Intravenous Q12H  . sodium chloride flush  3 mL Intravenous Q12H   Continuous Infusions:   LOS: 0 days    Time spent: 28 minutes.    Hawley Pavia Tanna Furry, MD [facility] Pager [phone]  If 7PM-7AM, please contact night-coverage [email or web address] Password [email or web address] 09/24/2016, 12:27 PM

## 2016-09-24 NOTE — Progress Notes (Signed)
Started patient on continuous albuterol nebulizer to deliver '20mg'$ /hr

## 2016-09-24 NOTE — Care Management Note (Signed)
Case Management Note  Patient Details  Name: [name] MRN: [id number] Date of Birth: 1946-05-29  Subjective/Objective:  Pt lives with spouse, has home O2 through [facility].  Pt states he also has 3-N-1 and rolling walker which he does not use.  Discussed home health services and pt states he does not need any services, states he went to [facility] recently and needs no more rehab.  Pt is followed by [employer] and is agreeable to continued telephone calls.                           Expected Discharge Plan:  Home/Self Care  Discharge planning Services  CM Consult  Status of Service:  In process, will continue to follow  Girard Cooter, RN 09/24/2016, 1:29 PM

## 2016-09-25 LAB — CBC
HEMATOCRIT: 32.1 % — AB (ref 39.0–52.0)
Hemoglobin: 10.2 g/dL — ABNORMAL LOW (ref 13.0–17.0)
MCH: 32.2 pg (ref 26.0–34.0)
MCHC: 31.8 g/dL (ref 30.0–36.0)
MCV: 101.3 fL — ABNORMAL HIGH (ref 78.0–100.0)
PLATELETS: 150 10*3/uL (ref 150–400)
RBC: 3.17 MIL/uL — ABNORMAL LOW (ref 4.22–5.81)
RDW: 17 % — AB (ref 11.5–15.5)
WBC: 13.2 10*3/uL — AB (ref 4.0–10.5)

## 2016-09-25 MED ORDER — IPRATROPIUM-ALBUTEROL 0.5-2.5 (3) MG/3ML IN SOLN
3.0000 mL | Freq: Four times a day (QID) | RESPIRATORY_TRACT | Status: DC
  Administered 2016-09-25 – 2016-09-29 (×16): 3 mL via RESPIRATORY_TRACT
  Filled 2016-09-25 (×16): qty 3

## 2016-09-25 MED ORDER — LEVOFLOXACIN 750 MG PO TABS
750.0000 mg | ORAL_TABLET | ORAL | Status: DC
  Administered 2016-09-26 – 2016-09-27 (×2): 750 mg via ORAL
  Filled 2016-09-25 (×2): qty 1

## 2016-09-25 NOTE — Plan of Care (Signed)
Problem: Activity: Goal: Ability to implement measures to reduce episodes of fatigue will improve Outcome: Progressing Patient aware of the variances in his ability to breathe/ get short of breath. Patient observed slowing down and doing pursed lip breathing when SOB occurs.   Problem: Respiratory: Goal: Levels of oxygenation will improve Outcome: Progressing Patient on BIPAP for HS; however, at shift change was able to maintain adequate oxygenation on 5L. Plans to decrease and wean to 4L(pt baseline of home O2).

## 2016-09-25 NOTE — Progress Notes (Signed)
OT Cancellation Note  Patient Details Name: [name] MRN: [id number] DOB: 1946-01-14   Cancelled Treatment:     Pt. Does not want OT services and states he is able to do his own ADLs.   Sanyla Summey 09/25/2016, 9:49 AM

## 2016-09-25 NOTE — Progress Notes (Signed)
PROGRESS NOTE    [name]  OFB:[id number] DOB: 23-Jun-1946 DOA: 09/24/2016 PCP: [name], MD   Brief Narrative: 70 y.o. male with medical history significant for COPD with chronic hypoxic respiratory failure requiring 4 L of oxygen at home, hypertension, coronary artery disease, and obesity who presents to the ED with dyspnea, chills, and increased cough with yellow sputum production.  Assessment & Plan:   #  COPD exacerbation ([name]), acute on chronic hypoxic/hypercapnic respiratory failure: -Has dry cough and shortness of breath, overall slowly improving. He is still requiring 5-6 L of oxygen. Continue IV Solu-Medrol, Levaquin, nebulization. He will need tapering off the steroid on discharge. -Continue to wean oxygen slowly to 4 L which is his home dose. Discussed with the patient's nurse. -Respiratory viral studies negative. Follow-up respiratory culture result. -BiPAP as needed. -Continue supportive treatment.  #Essential hypertension: Blood pressure acceptable. Continue  Lasix, Imdur, lisinopril and metoprolol. Monitor blood pressure closely.  #History of coronary artery disease: Continue aspirin, statin, beta blocker.  #History of dilated cardiomyopathy as per patient chart: As per echocardiogram from December 2016 patient has normal left ventricular systolic function. Patient looks euvolemic on physical exam. BNP not elevated. I do not think patient is having CHF this time. Continue home dose of oral Lasix.  Continue current supportive care.  DVT prophylaxis: Lovenox subcutaneous Code Status: Full code Family Communication: No family present at bedside Disposition Plan: Likely discharge home in 1-2 days    Consultants:   None  Procedures: Requiring BiPAP as needed. Antimicrobials: Levaquin since November 10  Subjective: Patient was seen and examined at bedside. No new event. Requiring intermittent BiPAP especially in the night. Still has shortness of breath  and dry cough. Overall feels better. Denied chest pain, nausea, vomiting, abdominal pain. Denied constipation.  Objective: Vitals:   09/25/16 0800 09/25/16 0900 09/25/16 0936 09/25/16 1000  BP: (!) 144/83 139/69 139/89 138/76  Pulse: 82 (!) 105 89 92  Resp: 18 (!) 27 (!) 24 (!) 25  Temp:   98.7 F (37.1 C)   TempSrc:   Oral   SpO2: 94% (!) 88% 93% 94%  Weight:      Height:        Intake/Output Summary (Last 24 hours) at 09/25/16 1046 Last data filed at 09/25/16 0900  Gross per 24 hour  Intake              876 ml  Output             1850 ml  Net             -974 ml   Filed Weights   09/24/16 0436 09/25/16 0500  Weight: 110 kg (242 lb 8.1 oz) 108.6 kg (239 lb 6.7 oz)    Examination:  General exam: Lying on bed comfortable. He is speaking full sentences.  Respiratory system: Minimal expiratory wheeze, no crackle Cardiovascular system: Regular rate rhythm, S1-S2 normal. No pedal edema. Gastrointestinal system: Abdomen is nondistended, soft and nontender. Normal bowel sounds heard. Central nervous system: Alert and oriented. No focal neurological deficits. Extremities: Symmetric 5 x 5 power. Skin: No rashes, lesions or ulcers Psychiatry: Judgement and insight appear normal. Mood & affect appropriate.     Data Reviewed: I have personally reviewed following labs and imaging studies  CBC:  Recent Labs Lab 09/24/16 0011 09/25/16 0341  WBC 13.1* 13.2*  NEUTROABS 10.5*  --   HGB 11.4* 10.2*  HCT 36.7* 32.1*  MCV 101.4* 101.3*  PLT 149*  151   Basic Metabolic Panel:  Recent Labs Lab 09/24/16 0011  NA 139  K 3.8  CL 100*  CO2 30  GLUCOSE 166*  BUN 10  CREATININE 0.74  CALCIUM 9.0   GFR: Estimated Creatinine Clearance: 97.9 mL/min (by C-G formula based on SCr of 0.74 mg/dL). Liver Function Tests: No results for input(s): AST, ALT, ALKPHOS, BILITOT, PROT, ALBUMIN in the last 168 hours. No results for input(s): LIPASE, AMYLASE in the last 168 hours. No  results for input(s): AMMONIA in the last 168 hours. Coagulation Profile: No results for input(s): INR, PROTIME in the last 168 hours. Cardiac Enzymes: No results for input(s): CKTOTAL, CKMB, CKMBINDEX, TROPONINI in the last 168 hours. BNP (last 3 results) No results for input(s): PROBNP in the last 8760 hours. HbA1C: No results for input(s): HGBA1C in the last 72 hours. CBG: No results for input(s): GLUCAP in the last 168 hours. Lipid Profile: No results for input(s): CHOL, HDL, LDLCALC, TRIG, CHOLHDL, LDLDIRECT in the last 72 hours. Thyroid Function Tests: No results for input(s): TSH, T4TOTAL, FREET4, T3FREE, THYROIDAB in the last 72 hours. Anemia Panel:  Recent Labs  09/24/16 0309  VITAMINB12 917*  FOLATE 33.8  FERRITIN 84  TIBC 322  IRON 18*  RETICCTPCT 6.3*   Sepsis Labs: No results for input(s): PROCALCITON, LATICACIDVEN in the last 168 hours.  Recent Results (from the past 240 hour(s))  Respiratory Panel by PCR     Status: None   Collection Time: 09/24/16  2:50 AM  Result Value Ref Range Status   Adenovirus NOT DETECTED NOT DETECTED Final   Coronavirus 229E NOT DETECTED NOT DETECTED Final   Coronavirus HKU1 NOT DETECTED NOT DETECTED Final   Coronavirus NL63 NOT DETECTED NOT DETECTED Final   Coronavirus OC43 NOT DETECTED NOT DETECTED Final   Metapneumovirus NOT DETECTED NOT DETECTED Final   Rhinovirus / Enterovirus NOT DETECTED NOT DETECTED Final   Influenza A NOT DETECTED NOT DETECTED Final   Influenza B NOT DETECTED NOT DETECTED Final   Parainfluenza Virus 1 NOT DETECTED NOT DETECTED Final   Parainfluenza Virus 2 NOT DETECTED NOT DETECTED Final   Parainfluenza Virus 3 NOT DETECTED NOT DETECTED Final   Parainfluenza Virus 4 NOT DETECTED NOT DETECTED Final   Respiratory Syncytial Virus NOT DETECTED NOT DETECTED Final   Bordetella pertussis NOT DETECTED NOT DETECTED Final   Chlamydophila pneumoniae NOT DETECTED NOT DETECTED Final   Mycoplasma pneumoniae NOT  DETECTED NOT DETECTED Final  MRSA PCR Screening     Status: None   Collection Time: 09/24/16  4:41 AM  Result Value Ref Range Status   MRSA by PCR NEGATIVE NEGATIVE Final    Comment:        The GeneXpert MRSA Assay (FDA approved for NASAL specimens only), is one component of a comprehensive MRSA colonization surveillance program. It is not intended to diagnose MRSA infection nor to guide or monitor treatment for MRSA infections.   Culture, sputum-assessment     Status: None   Collection Time: 09/24/16  5:32 AM  Result Value Ref Range Status   Specimen Description EXPECTORATED SPUTUM  Final   Special Requests NONE  Final   Sputum evaluation THIS SPECIMEN IS ACCEPTABLE FOR SPUTUM CULTURE  Final   Report Status 09/24/2016 FINAL  Final  Culture, respiratory (NON-Expectorated)     Status: None (Preliminary result)   Collection Time: 09/24/16  5:32 AM  Result Value Ref Range Status   Specimen Description SPU  Final   Special Requests  NONE  Final   Gram Stain   Final    MODERATE WBC PRESENT, PREDOMINANTLY PMN FEW SQUAMOUS EPITHELIAL CELLS PRESENT MODERATE GRAM POSITIVE COCCI IN PAIRS IN CLUSTERS FEW GRAM NEGATIVE RODS FEW GRAM POSITIVE RODS    Culture CULTURE REINCUBATED FOR BETTER GROWTH  Final   Report Status PENDING  Incomplete         Radiology Studies: Dg Chest Portable 1 View  Result Date: 09/24/2016 CLINICAL DATA:  Shortness of breath.  Respiratory distress. EXAM: PORTABLE CHEST 1 VIEW COMPARISON:  Chest radiograph 10/20/2015 FINDINGS: Bilateral upper lobe bullous disease is again seen. There are diffuse reticulonodular opacities in the lung bases compatible with a degree of pulmonary fibrosis. Increased opacity in these areas suggest a degree of superimposed pulmonary edema. Cardiomediastinal contours are unchanged with persistent atherosclerotic calcification in the aortic arch. No pneumothorax or sizable pleural effusion. IMPRESSION: 1. Increased airspace opacities  superimposed on a background of chronic lung disease, suggestive of pulmonary edema. 2. Aortic atherosclerosis. 3. Bilateral upper lobe bullous disease. Electronically Signed   By: Ulyses Jarred M.D.   On: 09/24/2016 00:49        Scheduled Meds: . aspirin EC  81 mg Oral QHS  . budesonide (PULMICORT) nebulizer solution  0.25 mg Nebulization BID  . chlorhexidine  15 mL Mouth Rinse BID  . enoxaparin (LOVENOX) injection  40 mg Subcutaneous Daily  . furosemide  40 mg Oral BID  . isosorbide mononitrate  30 mg Oral Daily  . levofloxacin (LEVAQUIN) IV  750 mg Intravenous Q0600  . lisinopril  10 mg Oral Daily  . mouth rinse  15 mL Mouth Rinse q12n4p  . methylPREDNISolone (SOLU-MEDROL) injection  60 mg Intravenous Q6H  . metoprolol succinate  25 mg Oral Daily  . multivitamin with minerals  1 tablet Oral Daily  . omega-3 acid ethyl esters  1 g Oral Daily  . simvastatin  20 mg Oral q1800  . sodium chloride flush  3 mL Intravenous Q12H  . sodium chloride flush  3 mL Intravenous Q12H   Continuous Infusions:   LOS: 1 day    Time spent: 26 minutes.    Aden Youngman Tanna Furry, MD [facility] Pager [phone]  If 7PM-7AM, please contact night-coverage [email or web address] Password [email or web address] 09/25/2016, 10:46 AM

## 2016-09-26 ENCOUNTER — Inpatient Hospital Stay (HOSPITAL_COMMUNITY): Payer: Medicare Other

## 2016-09-26 LAB — CULTURE, RESPIRATORY W GRAM STAIN

## 2016-09-26 LAB — CULTURE, RESPIRATORY: CULTURE: NORMAL

## 2016-09-26 NOTE — Progress Notes (Signed)
s PROGRESS NOTE    [name]  OFB:[id number] DOB: 08-Dec-1945 DOA: 09/24/2016 PCP: [name], MD   Brief Narrative: 70 y.o. male with medical history significant for COPD with chronic hypoxic respiratory failure requiring 4 L of oxygen at home, hypertension, coronary artery disease, and obesity who presents to the ED with dyspnea, chills, and increased cough with yellow sputum production.  Assessment & Plan:   #  COPD exacerbation ([place]), acute on chronic hypoxic/hypercapnic respiratory failure: -Very Gradual clinical improvement. He still requiring 5-6 L of oxygen. Patient becomes more hypoxic during exertion. I will check chest x-ray. Continue IV Solu-Medrol, Levaquin, nebulization. He will need slow tapering of the steroid on discharge. Follow-up respiratory culture result. BiPAP as needed. Respiratory viral studies are negative. Continue to provide supportive care. Overall feels better than yesterday.  #Essential hypertension: Blood pressure acceptable. Continue  Lasix, Imdur, lisinopril and metoprolol. Monitor blood pressure closely.  #History of coronary artery disease: Continue aspirin, statin, beta blocker.  #History of dilated cardiomyopathy as per patient chart: As per echocardiogram from December 2016 patient has normal left ventricular systolic function. Patient looks euvolemic on physical exam. BNP not elevated. I do not think patient is having CHF this time. Continue home dose of oral Lasix.  #Anxiety: Patient reported taking Xanax at home. Currently on Ativan as needed.  Continue current supportive care.  DVT prophylaxis: Lovenox subcutaneous Code Status: Full code Family Communication: Patient's wife at bedside. Disposition Plan: Likely discharge home in 1-2 days    Consultants:   None  Procedures: Requiring BiPAP as needed. Antimicrobials: Levaquin since November 10  Subjective: Patient was seen and examined at bedside. Patient reported feeling  anxious. His oxygen saturation drops with walking. Requiring 5-6 L of oxygen. Overall feels mild improvement but is still has shortness of breath and dry cough. Patient's wife at bedside. Denied fever, chills, nausea, vomiting, chest pain. Objective: Vitals:   09/26/16 0723 09/26/16 0727 09/26/16 0800 09/26/16 0928  BP:   (!) 161/88 (!) 161/87  Pulse:   93   Resp:   13   Temp:   97.7 F (36.5 C)   TempSrc:   Oral   SpO2: 95% 97% 91%   Weight:      Height:        Intake/Output Summary (Last 24 hours) at 09/26/16 1113 Last data filed at 09/26/16 0400  Gross per 24 hour  Intake              243 ml  Output             1275 ml  Net            -1032 ml   Filed Weights   09/24/16 0436 09/25/16 0500 09/26/16 0500  Weight: 110 kg (242 lb 8.1 oz) 108.6 kg (239 lb 6.7 oz) 108.7 kg (239 lb 10.2 oz)    Examination:  General exam: Sitting on bed comfortable, speaking full sentences.  Respiratory system: Clear to auscultation bilateral, no wheezing appreciated today. Cardiovascular system: Regular rate rhythm, S1-S2 normal. No pedal edema.. Gastrointestinal system: Abdomen is nondistended, soft and nontender. Normal bowel sounds heard. Central nervous system: Alert and oriented. No focal neurological deficits. Extremities: Symmetric 5 x 5 power. Skin: No rashes, lesions or ulcers Psychiatry: Judgement and insight appear normal. Mood & affect appropriate.     Data Reviewed: I have personally reviewed following labs and imaging studies  CBC:  Recent Labs Lab 09/24/16 0011 09/25/16 0341  WBC 13.1* 13.2*  NEUTROABS 10.5*  --   HGB 11.4* 10.2*  HCT 36.7* 32.1*  MCV 101.4* 101.3*  PLT 149* 865   Basic Metabolic Panel:  Recent Labs Lab 09/24/16 0011  NA 139  K 3.8  CL 100*  CO2 30  GLUCOSE 166*  BUN 10  CREATININE 0.74  CALCIUM 9.0   GFR: Estimated Creatinine Clearance: 98 mL/min (by C-G formula based on SCr of 0.74 mg/dL). Liver Function Tests: No results for  input(s): AST, ALT, ALKPHOS, BILITOT, PROT, ALBUMIN in the last 168 hours. No results for input(s): LIPASE, AMYLASE in the last 168 hours. No results for input(s): AMMONIA in the last 168 hours. Coagulation Profile: No results for input(s): INR, PROTIME in the last 168 hours. Cardiac Enzymes: No results for input(s): CKTOTAL, CKMB, CKMBINDEX, TROPONINI in the last 168 hours. BNP (last 3 results) No results for input(s): PROBNP in the last 8760 hours. HbA1C: No results for input(s): HGBA1C in the last 72 hours. CBG: No results for input(s): GLUCAP in the last 168 hours. Lipid Profile: No results for input(s): CHOL, HDL, LDLCALC, TRIG, CHOLHDL, LDLDIRECT in the last 72 hours. Thyroid Function Tests: No results for input(s): TSH, T4TOTAL, FREET4, T3FREE, THYROIDAB in the last 72 hours. Anemia Panel:  Recent Labs  09/24/16 0309  VITAMINB12 917*  FOLATE 33.8  FERRITIN 84  TIBC 322  IRON 18*  RETICCTPCT 6.3*   Sepsis Labs: No results for input(s): PROCALCITON, LATICACIDVEN in the last 168 hours.  Recent Results (from the past 240 hour(s))  Respiratory Panel by PCR     Status: None   Collection Time: 09/24/16  2:50 AM  Result Value Ref Range Status   Adenovirus NOT DETECTED NOT DETECTED Final   Coronavirus 229E NOT DETECTED NOT DETECTED Final   Coronavirus HKU1 NOT DETECTED NOT DETECTED Final   Coronavirus NL63 NOT DETECTED NOT DETECTED Final   Coronavirus OC43 NOT DETECTED NOT DETECTED Final   Metapneumovirus NOT DETECTED NOT DETECTED Final   Rhinovirus / Enterovirus NOT DETECTED NOT DETECTED Final   Influenza A NOT DETECTED NOT DETECTED Final   Influenza B NOT DETECTED NOT DETECTED Final   Parainfluenza Virus 1 NOT DETECTED NOT DETECTED Final   Parainfluenza Virus 2 NOT DETECTED NOT DETECTED Final   Parainfluenza Virus 3 NOT DETECTED NOT DETECTED Final   Parainfluenza Virus 4 NOT DETECTED NOT DETECTED Final   Respiratory Syncytial Virus NOT DETECTED NOT DETECTED Final    Bordetella pertussis NOT DETECTED NOT DETECTED Final   Chlamydophila pneumoniae NOT DETECTED NOT DETECTED Final   Mycoplasma pneumoniae NOT DETECTED NOT DETECTED Final  MRSA PCR Screening     Status: None   Collection Time: 09/24/16  4:41 AM  Result Value Ref Range Status   MRSA by PCR NEGATIVE NEGATIVE Final    Comment:        The GeneXpert MRSA Assay (FDA approved for NASAL specimens only), is one component of a comprehensive MRSA colonization surveillance program. It is not intended to diagnose MRSA infection nor to guide or monitor treatment for MRSA infections.   Culture, sputum-assessment     Status: None   Collection Time: 09/24/16  5:32 AM  Result Value Ref Range Status   Specimen Description EXPECTORATED SPUTUM  Final   Special Requests NONE  Final   Sputum evaluation THIS SPECIMEN IS ACCEPTABLE FOR SPUTUM CULTURE  Final   Report Status 09/24/2016 FINAL  Final  Culture, respiratory (NON-Expectorated)     Status: None (Preliminary result)   Collection Time: 09/24/16  5:32 AM  Result Value Ref Range Status   Specimen Description SPU  Final   Special Requests NONE  Final   Gram Stain   Final    MODERATE WBC PRESENT, PREDOMINANTLY PMN FEW SQUAMOUS EPITHELIAL CELLS PRESENT MODERATE GRAM POSITIVE COCCI IN PAIRS IN CLUSTERS FEW GRAM NEGATIVE RODS FEW GRAM POSITIVE RODS    Culture CULTURE REINCUBATED FOR BETTER GROWTH  Final   Report Status PENDING  Incomplete         Radiology Studies: No results found.      Scheduled Meds: . aspirin EC  81 mg Oral QHS  . budesonide (PULMICORT) nebulizer solution  0.25 mg Nebulization BID  . chlorhexidine  15 mL Mouth Rinse BID  . enoxaparin (LOVENOX) injection  40 mg Subcutaneous Daily  . furosemide  40 mg Oral BID  . ipratropium-albuterol  3 mL Nebulization QID  . isosorbide mononitrate  30 mg Oral Daily  . levofloxacin  750 mg Oral Q24H  . lisinopril  10 mg Oral Daily  . mouth rinse  15 mL Mouth Rinse q12n4p  .  methylPREDNISolone (SOLU-MEDROL) injection  60 mg Intravenous Q6H  . metoprolol succinate  25 mg Oral Daily  . multivitamin with minerals  1 tablet Oral Daily  . omega-3 acid ethyl esters  1 g Oral Daily  . simvastatin  20 mg Oral q1800  . sodium chloride flush  3 mL Intravenous Q12H  . sodium chloride flush  3 mL Intravenous Q12H   Continuous Infusions:   LOS: 2 days    Time spent: 25 minutes.     Dron Tanna Furry, MD [facility] Pager [phone]  If 7PM-7AM, please contact night-coverage [email or web address] Password [email or web address] 09/26/2016, 11:13 AM

## 2016-09-26 NOTE — Progress Notes (Signed)
Attempted to wean pt O2 level to 4L.  Pt desat into low 80's after ~5 mins.

## 2016-09-27 LAB — BASIC METABOLIC PANEL
Anion gap: 10 (ref 5–15)
BUN: 31 mg/dL — ABNORMAL HIGH (ref 6–20)
CALCIUM: 8.6 mg/dL — AB (ref 8.9–10.3)
CO2: 29 mmol/L (ref 22–32)
CREATININE: 1.11 mg/dL (ref 0.61–1.24)
Chloride: 98 mmol/L — ABNORMAL LOW (ref 101–111)
GLUCOSE: 316 mg/dL — AB (ref 65–99)
Potassium: 4.1 mmol/L (ref 3.5–5.1)
Sodium: 137 mmol/L (ref 135–145)

## 2016-09-27 MED ORDER — METHYLPREDNISOLONE SODIUM SUCC 40 MG IJ SOLR
40.0000 mg | Freq: Two times a day (BID) | INTRAMUSCULAR | Status: DC
  Administered 2016-09-27 – 2016-09-29 (×4): 40 mg via INTRAVENOUS
  Filled 2016-09-27 (×5): qty 1

## 2016-09-27 MED ORDER — AZITHROMYCIN 500 MG PO TABS
500.0000 mg | ORAL_TABLET | Freq: Every day | ORAL | Status: AC
  Administered 2016-09-27 – 2016-09-29 (×3): 500 mg via ORAL
  Filled 2016-09-27 (×3): qty 1

## 2016-09-27 MED ORDER — ALPRAZOLAM 0.5 MG PO TABS
0.5000 mg | ORAL_TABLET | Freq: Three times a day (TID) | ORAL | Status: DC | PRN
  Administered 2016-09-27 – 2016-09-28 (×2): 0.5 mg via ORAL
  Filled 2016-09-27 (×2): qty 1

## 2016-09-27 MED ORDER — IPRATROPIUM-ALBUTEROL 0.5-2.5 (3) MG/3ML IN SOLN: 3.0000 mL | RESPIRATORY_TRACT | Status: DC | PRN

## 2016-09-27 NOTE — Consult Note (Signed)
   [facility] Inpatient Consult   09/27/2016  [name] Mar 29, 1946 [id number]  Patient is currently active with [facility] for chronic disease management services.  Chart review reveals the patient is a 70 y.o.malewith medical history significant for COPD with chronic hypoxic respiratory failure requiring 4 L of oxygen at home, hypertension, coronary artery disease, and obesity who presents to the ED with dyspnea, chills, and increased cough with yellow sputum production. Patient has been engaged by a [place] for several months. Active consent is on file. Met with patient and wife at the bedside to endorse ongoing care management services.   Patient and wife verbalized understanding.  Patient will receive a post discharge transition of care call and will be evaluated for monthly home visits for assessments and disease process education. Reviewed Inpatient Case Manager notes and is  aware that [facility] following. Of note, [facility] Care Management services does not replace or interfere with any services that are needed or arranged by inpatient case management or social work.  For additional questions or referrals please contact:  Natividad Brood, RN BSN [facility] Liaison  [phone] business mobile phone Toll free office [phone]

## 2016-09-27 NOTE — Progress Notes (Signed)
PROGRESS NOTE    [name]  ASN:[id number] DOB: July 27, 1946 DOA: 09/24/2016 PCP: [name], MD   Brief Narrative:  70 yo male with COPD and chronic hypoxic respiratory failure on home 101 (4LPM), presented with 3 to 4 days of worsening dyspnea and increased sputum production. On initial examination found in respiratory distress, required non invasive mechanical ventilation, possible component of heart failure decompensations. On steroids and diuresis.     Assessment & Plan:   Principal Problem:   COPD exacerbation ([place]) Active Problems:   Anxiety   Essential hypertension   Coronary atherosclerosis of native coronary artery   Acute on chronic respiratory failure with hypoxia and hypercapnia (HCC)   Macrocytic anemia   COPD with acute exacerbation ([facility])   1, Hypoxic and hypercapnic respiratory failure acute on chronic. Will continue bronchodilator therapy scheduled and as needed. Oxymetry monitoring and supplemental 02 per [place] to target 02 sat above 88%. Patient on supplemental home 02 all the time  (4LPM). Will continue diuresis to target negative fluid balance. Chest film personally reviewed noted improvement of basilar alveolar and intertitial infiltrates. Will change levofloxacin for azithromycin for air way inflamation and will decrease dose of steroids, continue bipap at night and as needed.   2. Cardiogenic pulmonary edema. 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   3. Acute on chronic diastolic heart failure. Will continue diuresis, lisinopril, isosorbide and metoprolol, blood pressure 193 to 790 systolic.   4. CAD. Continue antiplatelet therapy and statin.  5. HTN. Continue blood pressure with isosorbide, metoprolol and lisinopril.   6, Anxiety. Will resume alprazolam and will dc lorazepam.   DVT prophylaxis: enoxaparin  Code Status: full  Family Communication: no  family at the bedside  Disposition Plan: home .   Consultants:     Procedures:    Antimicrobials:   Levofloxacin.   Subjective: Patient with persistent dyspnea but decreased intensity, positive cough with increased sputum production, no chest pain or palpitations, no nausea or vomiting.   Objective: Vitals:   09/27/16 0600 09/27/16 0700 09/27/16 0739 09/27/16 0756  BP: (!) 149/79 (!) 141/76    Pulse: (!) 58 65    Resp: 17 17    Temp:      TempSrc:      SpO2: 95% 97% 94% 94%  Weight:      Height:        Intake/Output Summary (Last 24 hours) at 09/27/16 0810 Last data filed at 09/27/16 0600  Gross per 24 hour  Intake                3 ml  Output             1950 ml  Net            -1947 ml   Filed Weights   09/26/16 0500 09/26/16 2141 09/27/16 0500  Weight: 108.7 kg (239 lb 10.2 oz) 107.3 kg (236 lb 8 oz) 107.2 kg (236 lb 5.3 oz)    Examination:  General exam: positive dyspnea, not in pain, deconditioned E ENT: mild pallor, oral mucosa moist, no icterus Respiratory system: bilateral decreased breath sounds with poor air movement, no wheezing or rhonchi. Cardiovascular system: S1 & S2 heard, RRR. No JVD, murmurs, rubs, gallops or clicks. Trace pedal edema. Gastrointestinal system: Abdomen is nondistended, soft and nontender. No organomegaly or masses felt. Normal bowel sounds heard. Central nervous system: Alert and oriented. No  focal neurological deficits. Extremities: Symmetric 5 x 5 power. Skin: No rashes, lesions or ulcers      Data Reviewed: I have personally reviewed following labs and imaging studies  CBC:  Recent Labs Lab 09/24/16 0011 09/25/16 0341  WBC 13.1* 13.2*  NEUTROABS 10.5*  --   HGB 11.4* 10.2*  HCT 36.7* 32.1*  MCV 101.4* 101.3*  PLT 149* 161   Basic Metabolic Panel:  Recent Labs Lab 09/24/16 0011  NA 139  K 3.8  CL 100*  CO2 30  GLUCOSE 166*  BUN 10  CREATININE 0.74  CALCIUM 9.0   GFR: Estimated Creatinine  Clearance: 97.3 mL/min (by C-G formula based on SCr of 0.74 mg/dL). Liver Function Tests: No results for input(s): AST, ALT, ALKPHOS, BILITOT, PROT, ALBUMIN in the last 168 hours. No results for input(s): LIPASE, AMYLASE in the last 168 hours. No results for input(s): AMMONIA in the last 168 hours. Coagulation Profile: No results for input(s): INR, PROTIME in the last 168 hours. Cardiac Enzymes: No results for input(s): CKTOTAL, CKMB, CKMBINDEX, TROPONINI in the last 168 hours. BNP (last 3 results) No results for input(s): PROBNP in the last 8760 hours. HbA1C: No results for input(s): HGBA1C in the last 72 hours. CBG: No results for input(s): GLUCAP in the last 168 hours. Lipid Profile: No results for input(s): CHOL, HDL, LDLCALC, TRIG, CHOLHDL, LDLDIRECT in the last 72 hours. Thyroid Function Tests: No results for input(s): TSH, T4TOTAL, FREET4, T3FREE, THYROIDAB in the last 72 hours. Anemia Panel: No results for input(s): VITAMINB12, FOLATE, FERRITIN, TIBC, IRON, RETICCTPCT in the last 72 hours. Sepsis Labs: No results for input(s): PROCALCITON, LATICACIDVEN in the last 168 hours.  Recent Results (from the past 240 hour(s))  Respiratory Panel by PCR     Status: None   Collection Time: 09/24/16  2:50 AM  Result Value Ref Range Status   Adenovirus NOT DETECTED NOT DETECTED Final   Coronavirus 229E NOT DETECTED NOT DETECTED Final   Coronavirus HKU1 NOT DETECTED NOT DETECTED Final   Coronavirus NL63 NOT DETECTED NOT DETECTED Final   Coronavirus OC43 NOT DETECTED NOT DETECTED Final   Metapneumovirus NOT DETECTED NOT DETECTED Final   Rhinovirus / Enterovirus NOT DETECTED NOT DETECTED Final   Influenza A NOT DETECTED NOT DETECTED Final   Influenza B NOT DETECTED NOT DETECTED Final   Parainfluenza Virus 1 NOT DETECTED NOT DETECTED Final   Parainfluenza Virus 2 NOT DETECTED NOT DETECTED Final   Parainfluenza Virus 3 NOT DETECTED NOT DETECTED Final   Parainfluenza Virus 4 NOT DETECTED  NOT DETECTED Final   Respiratory Syncytial Virus NOT DETECTED NOT DETECTED Final   Bordetella pertussis NOT DETECTED NOT DETECTED Final   Chlamydophila pneumoniae NOT DETECTED NOT DETECTED Final   Mycoplasma pneumoniae NOT DETECTED NOT DETECTED Final  MRSA PCR Screening     Status: None   Collection Time: 09/24/16  4:41 AM  Result Value Ref Range Status   MRSA by PCR NEGATIVE NEGATIVE Final    Comment:        The GeneXpert MRSA Assay (FDA approved for NASAL specimens only), is one component of a comprehensive MRSA colonization surveillance program. It is not intended to diagnose MRSA infection nor to guide or monitor treatment for MRSA infections.   Culture, sputum-assessment     Status: None   Collection Time: 09/24/16  5:32 AM  Result Value Ref Range Status   Specimen Description EXPECTORATED SPUTUM  Final   Special Requests NONE  Final   Sputum  evaluation THIS SPECIMEN IS ACCEPTABLE FOR SPUTUM CULTURE  Final   Report Status 09/24/2016 FINAL  Final  Culture, respiratory (NON-Expectorated)     Status: None   Collection Time: 09/24/16  5:32 AM  Result Value Ref Range Status   Specimen Description SPU  Final   Special Requests NONE  Final   Gram Stain   Final    MODERATE WBC PRESENT, PREDOMINANTLY PMN FEW SQUAMOUS EPITHELIAL CELLS PRESENT MODERATE GRAM POSITIVE COCCI IN PAIRS IN CLUSTERS FEW GRAM NEGATIVE RODS FEW GRAM POSITIVE RODS    Culture Consistent with normal respiratory flora.  Final   Report Status 09/26/2016 FINAL  Final         Radiology Studies: Dg Chest 2 View  Result Date: 09/26/2016 CLINICAL DATA:  Increased shortness of breath EXAM: CHEST  2 VIEW COMPARISON:  September 24, 2016 FINDINGS: Increasing bibasilar opacities, consistent with atelectasis versus infiltrate. Recommend follow-up to resolution. No other changes. IMPRESSION: Increasing bibasilar opacities, consistent with atelectasis versus infiltrate. Recommend follow-up to resolution.  Electronically Signed   By: Dorise Bullion III M.D   On: 09/26/2016 11:59        Scheduled Meds: . aspirin EC  81 mg Oral QHS  . budesonide (PULMICORT) nebulizer solution  0.25 mg Nebulization BID  . chlorhexidine  15 mL Mouth Rinse BID  . enoxaparin (LOVENOX) injection  40 mg Subcutaneous Daily  . furosemide  40 mg Oral BID  . ipratropium-albuterol  3 mL Nebulization QID  . isosorbide mononitrate  30 mg Oral Daily  . levofloxacin  750 mg Oral Q24H  . lisinopril  10 mg Oral Daily  . mouth rinse  15 mL Mouth Rinse q12n4p  . methylPREDNISolone (SOLU-MEDROL) injection  60 mg Intravenous Q6H  . metoprolol succinate  25 mg Oral Daily  . multivitamin with minerals  1 tablet Oral Daily  . omega-3 acid ethyl esters  1 g Oral Daily  . simvastatin  20 mg Oral q1800  . sodium chloride flush  3 mL Intravenous Q12H  . sodium chloride flush  3 mL Intravenous Q12H   Continuous Infusions:   LOS: 3 days       Mauricio Gerome Apley, MD [facility] Pager [phone]  If 7PM-7AM, please contact night-coverage [email or web address] Password [email or web address] 09/27/2016, 8:10 AM

## 2016-09-27 NOTE — Progress Notes (Addendum)
Physical Therapy Treatment Patient Details Name: [name] MRN: [id number] DOB: February 10, 1946 Today's Date: 09/27/2016    History of Present Illness Pt adm with acute on chronic respiratory failure and COPD exacerbation. Pt required bipap. PMH - COPD, bladder CA, HTN, CAD, anxiety,     PT Comments    Patient seen for mobility progression. Tolerated in hall ambulation on 6 liters with standing rest breaks required and desaturations to 86% on 6 liters, ~2 minutes to rebound with rest and pursed lip breathing. Patient then tolerated in room functional tasks but again easily desaturated despite 6 liters [place]. Increased time required to improved saturations >90%. Educated patient on energy conservation. Will continue to see and progress as tolerated.  Follow Up Recommendations  No PT follow up     Equipment Recommendations  None recommended by PT    Recommendations for Other Services       Precautions / Restrictions Precautions Precautions: Other (comment) Precaution Comments: watch SpO2 Restrictions Weight Bearing Restrictions: No    Mobility  Bed Mobility Overal bed mobility: Modified Independent                Transfers Overall transfer level: Needs assistance Equipment used: Rolling walker (2 wheeled) Transfers: Sit to/from Stand Sit to Stand: Min guard         General transfer comment: Assist for safety  Ambulation/Gait Ambulation/Gait assistance: Min guard Ambulation Distance (Feet): 120 Feet Assistive device: Rolling walker (2 wheeled) Gait Pattern/deviations: Decreased stride length;Step-through pattern;Trunk flexed Gait velocity: decreased Gait velocity interpretation: Below normal speed for age/gender General Gait Details: 2 standing rest breaks, ambulated on 6 liters [place], saturations dropped to 86% but rebounded with pursed lip breathing and standing rest break approximately 2 minutes.    Stairs            Wheelchair Mobility    Modified  [place] (Stroke Patients Only)       Balance Overall balance assessment: Needs assistance Sitting-balance support: No upper extremity supported Sitting balance-Leahy Scale: Good     Standing balance support: No upper extremity supported Standing balance-Leahy Scale: Fair Standing balance comment: some instability in static standing when not using UE support                    Cognition Arousal/Alertness: Awake/alert Behavior During Therapy: WFL for tasks assessed/performed Overall Cognitive Status: Within Functional Limits for tasks assessed                      Exercises      General Comments General comments (skin integrity, edema, etc.): patient performed in room functional tasks and hygiene/pericare. required increased O2 to maintain saturations.      Pertinent Vitals/Pain Pain Assessment: No/denies pain    Home Living                      Prior Function            PT Goals (current goals can now be found in the care plan section) Acute Rehab PT Goals Patient Stated Goal: return home PT Goal Formulation: With patient Time For Goal Achievement: 10/01/16 Potential to Achieve Goals: Good Progress towards PT goals: Progressing toward goals    Frequency    Min 3X/week      PT Plan Current plan remains appropriate    Co-evaluation             End of Session Equipment Utilized During Treatment: Gait belt  Activity Tolerance: Other (comment) (dyspnea ),  Patient left: in bed (sitting EOB);with call bell/phone within reach     Time: 9417-4081 PT Time Calculation (min) (ACUTE ONLY): 26 min  Charges:  $Gait Training: 8-22 mins $Therapeutic Activity: 8-22 mins                    G CodesDuncan Dull 10/07/2016, 5:26 PM Alben Deeds, [place] DPT  [phone]

## 2016-09-27 NOTE — Progress Notes (Addendum)
Neb tx done. Per Dr Cathlean Sauer at pts bedside sats goal greater than 88%. Decreased FIO2 to 4L [place] due to stable sats

## 2016-09-27 NOTE — Progress Notes (Signed)
Visited a few times with patient and wife to get advanced directive done. Provided spiritual/emotional support and prayer. Pt and wife were grateful for all. Chaplain available for follow-through.

## 2016-09-28 LAB — CBC WITH DIFFERENTIAL/PLATELET
BASOS ABS: 0 10*3/uL (ref 0.0–0.1)
Basophils Relative: 0 %
Eosinophils Absolute: 0 10*3/uL (ref 0.0–0.7)
Eosinophils Relative: 0 %
HCT: 37.3 % — ABNORMAL LOW (ref 39.0–52.0)
Hemoglobin: 11.8 g/dL — ABNORMAL LOW (ref 13.0–17.0)
LYMPHS PCT: 8 %
Lymphs Abs: 1 10*3/uL (ref 0.7–4.0)
MCH: 31.6 pg (ref 26.0–34.0)
MCHC: 31.6 g/dL (ref 30.0–36.0)
MCV: 99.7 fL (ref 78.0–100.0)
MONO ABS: 1.7 10*3/uL — AB (ref 0.1–1.0)
MONOS PCT: 13 %
Neutro Abs: 10.1 10*3/uL — ABNORMAL HIGH (ref 1.7–7.7)
Neutrophils Relative %: 79 %
Platelets: 184 10*3/uL (ref 150–400)
RBC: 3.74 MIL/uL — ABNORMAL LOW (ref 4.22–5.81)
RDW: 16.8 % — AB (ref 11.5–15.5)
WBC: 12.8 10*3/uL — ABNORMAL HIGH (ref 4.0–10.5)

## 2016-09-28 LAB — BASIC METABOLIC PANEL
ANION GAP: 8 (ref 5–15)
BUN: 35 mg/dL — ABNORMAL HIGH (ref 6–20)
CALCIUM: 8.9 mg/dL (ref 8.9–10.3)
CO2: 33 mmol/L — AB (ref 22–32)
Chloride: 97 mmol/L — ABNORMAL LOW (ref 101–111)
Creatinine, Ser: 1.09 mg/dL (ref 0.61–1.24)
GFR calc Af Amer: >60 mL/min (ref >60)
GFR calc non Af Amer: >60 mL/min (ref >60)
GLUCOSE: 240 mg/dL — AB (ref 65–99)
Potassium: 4.7 mmol/L (ref 3.5–5.1)
Sodium: 138 mmol/L (ref 135–145)

## 2016-09-28 LAB — MAGNESIUM: Magnesium: 2.2 mg/dL (ref 1.7–2.4)

## 2016-09-28 NOTE — Progress Notes (Signed)
Inpatient Diabetes Program Recommendations  AACE/ADA: New Consensus Statement on Inpatient Glycemic Control (2015)  Target Ranges:  Prepandial:   less than 140 mg/dL      Peak postprandial:   less than 180 mg/dL (1-2 hours)      Critically ill patients:  140 - 180 mg/dL   Lab Results  Component Value Date   GLUCAP 148 (H) 09/14/2010   HGBA1C 6.7 (H) 06/05/2013    Review of Glycemic Control  Diabetes history: No Diabetes history  Current orders for Inpatient glycemic control: none  Inpatient Diabetes Program Recommendations:  Patient admitted with COPD exacerbation placed on IV Solumedrol with current dose 40 mg Q12hours. Lab Glucose 316, 240 (0221 am). Hyperglycemia possibly due to steroids. Please consider CBGs and Novolog Sensitive Correction TID while on steroids.  Thanks,  Tama Headings RN, MSN, [facility] Inpatient Diabetes Coordinator Team Pager [phone] (8a-5p)

## 2016-09-28 NOTE — Care Management Note (Signed)
Case Management Note  Patient Details  Name: [name] MRN: [id number] Date of Birth: 19-Jun-1946  Subjective/Objective:  Pt lives with spouse, has O2 through [facility], 3-N-1, and rolling walker.  Has been on nocturnal BiPAP since admission and most likely will need same upon discharge.  CM talked to [facility] @ [facility] and she thinks Trilogy will be effective - will review clinical information to determine if pt qualifies for same.                         Expected Discharge Plan:  Home/Self Care  Discharge planning Services  CM Consult  HH Arranged:  Patient Refused  Status of Service:  In process, will continue to follow  Girard Cooter, RN 09/28/2016, 1:50 PM

## 2016-09-28 NOTE — Progress Notes (Signed)
Pt has 8 beats of Vtach '@619'$ . NP made aware

## 2016-09-28 NOTE — Progress Notes (Addendum)
PROGRESS NOTE    [name]  IZT:[id number] DOB: 01-22-1946 DOA: 09/24/2016 PCP: [name], MD    Brief Narrative:  70 yo male with COPD and chronic hypoxic respiratory failure on home 45 (4LPM), presented with 3 to 4 days of worsening dyspnea and increased sputum production. On initial examination found in respiratory distress, required non invasive mechanical ventilation, possible component of heart failure decompensations. On steroids and diuresis.    Assessment & Plan:   Principal Problem:   COPD exacerbation ([place]) Active Problems:   Anxiety   Essential hypertension   Coronary atherosclerosis of native coronary artery   Acute on chronic respiratory failure with hypoxia and hypercapnia (HCC)   Macrocytic anemia   COPD with acute exacerbation (HCC)  1, Hypoxic and hypercapnic respiratory failure acute on chronic. Responding well to bronchodilator therapy  Continue oxymetry monitoring and supplemental 02 per [place] to target 02 sat above 88%. Patient on supplemental home 02 all the time  (4LPM). Furosemide per his home dose. Will continue azithromycin. Will use bipap only as needed for now. If able to tolerate off bipap will plan to transfer to medical unit, not able to wean off bipap, social services consult for home bipap. Am ABG.   2. Cardiogenic pulmonary edema. 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 Will check echocardiogram, for now, will continue current dose of furosemide.   3. Acute on chronic diastolic heart failure. Contnue lisinopril, isosorbide and metoprolol, blood pressure 140 to 150, will continue furosemide and follow on echocardiogram.   4. CAD. Continue antiplatelet therapy and statin. No chest pain.   5. HTN. Continue with isosorbide, metoprolol and lisinopril, blood pressure controlled, no signs of hypervolemia.   6, Anxiety. Tolerating well alprazolam.   Late entry, patient had V. tach on the telemetry 8 beats, asymptomatic check magnesium,  echocardiogram. If persistent may need cardiology consultation.  Consultants:     Procedures:    Antimicrobials:   Levofloxacin. #1 (d/c)  Azithromycin #2  Subjective: Patient feeling better, able to sleep last night, no nausea or vomiting. Dyspnea has been improving, patient able to ambulate with supplemental oxygen per nasal cannula.   Objective: Vitals:   09/28/16 0600 09/28/16 0631 09/28/16 0739 09/28/16 0741  BP: (!) 156/92     Pulse: 70 73    Resp: 16 18    Temp:      TempSrc:      SpO2: 96% 90% 93% 94%  Weight:      Height:        Intake/Output Summary (Last 24 hours) at 09/28/16 0802 Last data filed at 09/28/16 0430  Gross per 24 hour  Intake              843 ml  Output             2126 ml  Net            -1283 ml   Filed Weights   09/26/16 2141 09/27/16 0500 09/28/16 0526  Weight: 107.3 kg (236 lb 8 oz) 107.2 kg (236 lb 5.3 oz) 107.8 kg (237 lb 10.5 oz)    Examination:  General exam: deconditioned E ENT: no pallor or icterus, oral mucosa moist. Respiratory system: Decreased breath sounds bilaterally with decreased ventilation. No wheezing, rhonchi or rales. Cardiovascular system: S1 & S2 heard, RRR. No JVD, murmurs, rubs, gallops or clicks. Trace nonpitting edema. Gastrointestinal system: Abdomen is nondistended, soft and nontender. No organomegaly or masses felt. Normal bowel sounds heard. Central nervous system:  Alert and oriented. No focal neurological deficits. Extremities: Symmetric 5 x 5 power. Skin: No rashes, lesions or ulcers   Data Reviewed: I have personally reviewed following labs and imaging studies  CBC:  Recent Labs Lab 09/24/16 0011 09/25/16 0341 09/28/16 0221  WBC 13.1* 13.2* 12.8*  NEUTROABS 10.5*  --  10.1*  HGB 11.4* 10.2* 11.8*  HCT 36.7* 32.1* 37.3*  MCV 101.4* 101.3* 99.7  PLT 149* 150 295   Basic Metabolic Panel:  Recent Labs Lab 09/24/16 0011 09/27/16 1008 09/28/16 0221  NA 139 137 138  K 3.8 4.1 4.7    CL 100* 98* 97*  CO2 30 29 33*  GLUCOSE 166* 316* 240*  BUN 10 31* 35*  CREATININE 0.74 1.11 1.09  CALCIUM 9.0 8.6* 8.9   GFR: Estimated Creatinine Clearance: 71.6 mL/min (by C-G formula based on SCr of 1.09 mg/dL). Liver Function Tests: No results for input(s): AST, ALT, ALKPHOS, BILITOT, PROT, ALBUMIN in the last 168 hours. No results for input(s): LIPASE, AMYLASE in the last 168 hours. No results for input(s): AMMONIA in the last 168 hours. Coagulation Profile: No results for input(s): INR, PROTIME in the last 168 hours. Cardiac Enzymes: No results for input(s): CKTOTAL, CKMB, CKMBINDEX, TROPONINI in the last 168 hours. BNP (last 3 results) No results for input(s): PROBNP in the last 8760 hours. HbA1C: No results for input(s): HGBA1C in the last 72 hours. CBG: No results for input(s): GLUCAP in the last 168 hours. Lipid Profile: No results for input(s): CHOL, HDL, LDLCALC, TRIG, CHOLHDL, LDLDIRECT in the last 72 hours. Thyroid Function Tests: No results for input(s): TSH, T4TOTAL, FREET4, T3FREE, THYROIDAB in the last 72 hours. Anemia Panel: No results for input(s): VITAMINB12, FOLATE, FERRITIN, TIBC, IRON, RETICCTPCT in the last 72 hours. Sepsis Labs: No results for input(s): PROCALCITON, LATICACIDVEN in the last 168 hours.  Recent Results (from the past 240 hour(s))  Respiratory Panel by PCR     Status: None   Collection Time: 09/24/16  2:50 AM  Result Value Ref Range Status   Adenovirus NOT DETECTED NOT DETECTED Final   Coronavirus 229E NOT DETECTED NOT DETECTED Final   Coronavirus HKU1 NOT DETECTED NOT DETECTED Final   Coronavirus NL63 NOT DETECTED NOT DETECTED Final   Coronavirus OC43 NOT DETECTED NOT DETECTED Final   Metapneumovirus NOT DETECTED NOT DETECTED Final   Rhinovirus / Enterovirus NOT DETECTED NOT DETECTED Final   Influenza A NOT DETECTED NOT DETECTED Final   Influenza B NOT DETECTED NOT DETECTED Final   Parainfluenza Virus 1 NOT DETECTED NOT DETECTED  Final   Parainfluenza Virus 2 NOT DETECTED NOT DETECTED Final   Parainfluenza Virus 3 NOT DETECTED NOT DETECTED Final   Parainfluenza Virus 4 NOT DETECTED NOT DETECTED Final   Respiratory Syncytial Virus NOT DETECTED NOT DETECTED Final   Bordetella pertussis NOT DETECTED NOT DETECTED Final   Chlamydophila pneumoniae NOT DETECTED NOT DETECTED Final   Mycoplasma pneumoniae NOT DETECTED NOT DETECTED Final  MRSA PCR Screening     Status: None   Collection Time: 09/24/16  4:41 AM  Result Value Ref Range Status   MRSA by PCR NEGATIVE NEGATIVE Final    Comment:        The GeneXpert MRSA Assay (FDA approved for NASAL specimens only), is one component of a comprehensive MRSA colonization surveillance program. It is not intended to diagnose MRSA infection nor to guide or monitor treatment for MRSA infections.   Culture, sputum-assessment     Status: None  Collection Time: 09/24/16  5:32 AM  Result Value Ref Range Status   Specimen Description EXPECTORATED SPUTUM  Final   Special Requests NONE  Final   Sputum evaluation THIS SPECIMEN IS ACCEPTABLE FOR SPUTUM CULTURE  Final   Report Status 09/24/2016 FINAL  Final  Culture, respiratory (NON-Expectorated)     Status: None   Collection Time: 09/24/16  5:32 AM  Result Value Ref Range Status   Specimen Description SPU  Final   Special Requests NONE  Final   Gram Stain   Final    MODERATE WBC PRESENT, PREDOMINANTLY PMN FEW SQUAMOUS EPITHELIAL CELLS PRESENT MODERATE GRAM POSITIVE COCCI IN PAIRS IN CLUSTERS FEW GRAM NEGATIVE RODS FEW GRAM POSITIVE RODS    Culture Consistent with normal respiratory flora.  Final   Report Status 09/26/2016 FINAL  Final         Radiology Studies: Dg Chest 2 View  Result Date: 09/26/2016 CLINICAL DATA:  Increased shortness of breath EXAM: CHEST  2 VIEW COMPARISON:  September 24, 2016 FINDINGS: Increasing bibasilar opacities, consistent with atelectasis versus infiltrate. Recommend follow-up to  resolution. No other changes. IMPRESSION: Increasing bibasilar opacities, consistent with atelectasis versus infiltrate. Recommend follow-up to resolution. Electronically Signed   By: Dorise Bullion III M.D   On: 09/26/2016 11:59        Scheduled Meds: . aspirin EC  81 mg Oral QHS  . azithromycin  500 mg Oral Daily  . budesonide (PULMICORT) nebulizer solution  0.25 mg Nebulization BID  . chlorhexidine  15 mL Mouth Rinse BID  . enoxaparin (LOVENOX) injection  40 mg Subcutaneous Daily  . furosemide  40 mg Oral BID  . ipratropium-albuterol  3 mL Nebulization QID  . isosorbide mononitrate  30 mg Oral Daily  . lisinopril  10 mg Oral Daily  . mouth rinse  15 mL Mouth Rinse q12n4p  . methylPREDNISolone (SOLU-MEDROL) injection  40 mg Intravenous BID  . metoprolol succinate  25 mg Oral Daily  . multivitamin with minerals  1 tablet Oral Daily  . omega-3 acid ethyl esters  1 g Oral Daily  . simvastatin  20 mg Oral q1800  . sodium chloride flush  3 mL Intravenous Q12H  . sodium chloride flush  3 mL Intravenous Q12H   Continuous Infusions:   LOS: 4 days       Tasheema Perrone Gerome Apley, MD [facility] Pager [phone]  If 7PM-7AM, please contact night-coverage [email or web address] Password [email or web address] 09/28/2016, 8:02 AM

## 2016-09-29 ENCOUNTER — Inpatient Hospital Stay (HOSPITAL_COMMUNITY): Payer: Medicare Other

## 2016-09-29 DIAGNOSIS — F419 Anxiety disorder, unspecified: Secondary | ICD-10-CM

## 2016-09-29 DIAGNOSIS — R0602 Shortness of breath: Secondary | ICD-10-CM

## 2016-09-29 DIAGNOSIS — J9621 Acute and chronic respiratory failure with hypoxia: Secondary | ICD-10-CM

## 2016-09-29 DIAGNOSIS — I2510 Atherosclerotic heart disease of native coronary artery without angina pectoris: Secondary | ICD-10-CM

## 2016-09-29 DIAGNOSIS — R0600 Dyspnea, unspecified: Secondary | ICD-10-CM

## 2016-09-29 DIAGNOSIS — D539 Nutritional anemia, unspecified: Secondary | ICD-10-CM

## 2016-09-29 DIAGNOSIS — I10 Essential (primary) hypertension: Secondary | ICD-10-CM

## 2016-09-29 DIAGNOSIS — J9622 Acute and chronic respiratory failure with hypercapnia: Secondary | ICD-10-CM

## 2016-09-29 DIAGNOSIS — I509 Heart failure, unspecified: Secondary | ICD-10-CM

## 2016-09-29 DIAGNOSIS — J441 Chronic obstructive pulmonary disease with (acute) exacerbation: Principal | ICD-10-CM

## 2016-09-29 LAB — BLOOD GAS, ARTERIAL
ACID-BASE EXCESS: 8.5 mmol/L — AB (ref 0.0–2.0)
BICARBONATE: 33.7 mmol/L — AB (ref 20.0–28.0)
DRAWN BY: 10006
FIO2: 36
O2 Saturation: 94.7 %
PATIENT TEMPERATURE: 98.6
PH ART: 7.381 (ref 7.350–7.450)
pCO2 arterial: 58.1 mmHg — ABNORMAL HIGH (ref 32.0–48.0)
pO2, Arterial: 78.8 mmHg — ABNORMAL LOW (ref 83.0–108.0)

## 2016-09-29 LAB — BASIC METABOLIC PANEL
ANION GAP: 8 (ref 5–15)
BUN: 28 mg/dL — ABNORMAL HIGH (ref 6–20)
CO2: 34 mmol/L — AB (ref 22–32)
Calcium: 8.4 mg/dL — ABNORMAL LOW (ref 8.9–10.3)
Chloride: 95 mmol/L — ABNORMAL LOW (ref 101–111)
Creatinine, Ser: 1 mg/dL (ref 0.61–1.24)
GLUCOSE: 240 mg/dL — AB (ref 65–99)
POTASSIUM: 5 mmol/L (ref 3.5–5.1)
Sodium: 137 mmol/L (ref 135–145)

## 2016-09-29 LAB — ECHOCARDIOGRAM COMPLETE
Height: 66 in
Weight: 3763.69 oz

## 2016-09-29 LAB — MAGNESIUM: Magnesium: 2.1 mg/dL (ref 1.7–2.4)

## 2016-09-29 MED ORDER — LISINOPRIL 10 MG PO TABS: 10.0000 mg | ORAL_TABLET | Freq: Every day | ORAL | 0 refills | Status: DC

## 2016-09-29 MED ORDER — PREDNISONE 10 MG PO TABS: 10.0000 mg | ORAL_TABLET | Freq: Every day | ORAL | 0 refills | Status: DC

## 2016-09-29 NOTE — Discharge Summary (Signed)
Physician Discharge Summary  [name] KGY:[id number] DOB: Apr 02, 1946 DOA: 09/24/2016  PCP: Andrew Hatchet, MD  Admit date: 09/24/2016 Discharge date: 09/29/2016  Time spent: 45 minutes  Recommendations for Outpatient Follow-up:  Patient will be discharged to home with nightly Bipap.  Continue home oxygen. Patient will need to follow up with primary care provider within one week of discharge.  Follow up with pulmonology in 1-2 weeks.  Patient should continue medications as prescribed.  Patient should follow a heart healthy diet.   Discharge Diagnoses:  Acute on chronic respiratory failure with hypoxia and hypercapnia COPD exacerbation Cardiogenic pulmonary edema Acute on chronic diastolic heart failure Nonsustained V. Tach Coronary artery disease Essential hypertension Anxiety  Discharge Condition: Stable  Diet recommendation: heart healthy   Filed Weights   09/27/16 0500 09/28/16 0526 09/29/16 0500  Weight: 107.2 kg (236 lb 5.3 oz) 107.8 kg (237 lb 10.5 oz) 106.7 kg (235 lb 3.7 oz)    History of present illness:  On 09/24/2016 by Dr. Christia Reading [name] [name] is a 70 y.o. male with medical history significant for COPD with chronic requirement for 2-3 L/m of supplemental oxygen, hypertension, coronary artery disease, and obesity who presents to the ED with dyspnea, chills, and increased cough with yellow sputum production. Patient reports that he had been in his usual state of health until several days ago when he began developing a increase in his chronic cough with increased sputum production which has turned yellow. He did not feel particularly short of breath beyond his baseline, however, until tonight, just prior to arrival when he developed acute worsening associated with subjective fevers and chills. He had difficulty catching his breath even while at rest and EMS was activated for transport to the hospital. Patient was given a DuoNeb treatment en route to the  hospital and was placed on CPAP briefly but could not tolerate it. He denies any recent long distance travel, prolonged immobilization, or swelling or tenderness of the lower extremity. He denies chest pain or palpitations.  Hospital Course:  Acute on chronic respiratory failure with hypoxia and hypercapnia -Patient noted to have O2 sats down to 83% -Patient does use 4 L of home oxygen -Patient responded well to bronchodilator therapy -Continue azithromycin -Plan for patient to be discharged with BiPAP to use nocturnally -Patient to follow-up with his pulmonologist, Dr. Lenna [name] -Patient will need sleep study  COPD exacerbation -Continue azithromycin, steroids, breathing treatments  Cardiogenic pulmonary edema -Suspected to be related to cor pulmonale -Echocardiogram EF 02-63%, grade 1 diastolic dysfunction. PA pressure 58mHg -Continue Lasix  Acute on chronic diastolic heart failure -Chest x-ray showed pulmonary edema -BNP 29 -Continue Lasix -Monitored intake and output, daily weights  Nonsustained V. Tach -Same that patient had 8 beat run of nonsustained V. tach yesterday evening -Magnesium 2.1, potassium 5 -Echocardiogram as noted above  Coronary artery disease -Currently chest pain-free -Continue statin, aspirin  Essential hypertension -Continue metoprolol, lisinopril, isosorbide, Lasix  Anxiety -Continue Xanax as needed  Procedures: Echocardiogram  Consultations: None  Discharge Exam: Vitals:   09/29/16 0939 09/29/16 1230  BP: (!) 158/83 115/64  Pulse: 96 95  Resp:  19  Temp:  98 F (36.7 C)     General: Well developed, well nourished, NAD, appears stated age  HEENT: NCAT, mucous membranes moist.  Cardiovascular: S1 S2 auscultated, no rubs, murmurs or gallops. Regular rate and rhythm.  Respiratory: Diminished breath sounds however clear. No wheezing, rhonchi or rales  Abdomen: Soft, nontender, nondistended, + bowel sounds  Extremities: warm dry  without cyanosis clubbing. +trace LE edema  Neuro: AAOx3, nonfocal  Psych: Normal affect and demeanor with intact judgement and insight  Discharge Instructions Discharge Instructions    AMB Referral to [facility]    Complete by:  As directed    Reason for consult:  COPD exacerbation, sepsis   Diagnoses of:  COPD/ Pneumonia   Expected date of contact:  1-3 days (reserved for hospital discharges)   Patient was active with [facility], please assign to community care manager for post hospital follow up and assess for home visits.  For questions, please contact:  Andrew Brood, RN BSN [facility] Liaison  [phone] business mobile phone Toll free office [phone]   Discharge instructions    Complete by:  As directed    Patient will be discharged to home with nightly Bipap.  Continue home oxygen. Patient will need to follow up with primary care provider within one week of discharge.  Follow up with pulmonology in 1-2 weeks.  Patient should continue medications as prescribed.  Patient should follow a heart healthy diet.     Current Discharge Medication List    START taking these medications   Details  predniSONE (DELTASONE) 10 MG tablet Take 1 tablet (10 mg total) by mouth daily with breakfast. Take '40mg'$  (4 tabs) x 3 days, then taper to '30mg'$  (3 tabs) x 3 days, then '20mg'$  (2 tabs) x 3days, then '10mg'$  (1 tab) x 3days, then stop. Qty: 30 tablet, Refills: 0      CONTINUE these medications which have CHANGED   Details  lisinopril (PRINIVIL,ZESTRIL) 10 MG tablet Take 1 tablet (10 mg total) by mouth daily. Qty: 30 tablet, Refills: 0      CONTINUE these medications which have NOT CHANGED   Details  albuterol (PROVENTIL HFA;VENTOLIN HFA) 108 (90 BASE) MCG/ACT inhaler Inhale 2 puffs into the lungs 4 (four) times daily as needed for wheezing (for use when not at home / unable to use nebulizer). Qty: 1 Inhaler, Refills: 0    Albuterol Sulfate (PROAIR  RESPICLICK) 644 (90 Base) MCG/ACT AEPB Inhale 2 puffs into the lungs every 6 (six) hours as needed. Qty: 1 each, Refills: 5    ALPRAZolam (XANAX) 0.5 MG tablet Take 0.5 mg by mouth 3 (three) times daily as needed for anxiety or sleep.    aspirin 81 MG tablet Take 81 mg by mouth at bedtime.     budesonide (PULMICORT) 0.25 MG/2ML nebulizer solution Take 2 mLs (0.25 mg total) by nebulization 2 (two) times daily. Qty: 60 mL, Refills: 5    Flaxseed, Linseed, (FLAXSEED OIL) 1000 MG CAPS Take 1 capsule by mouth daily.      furosemide (LASIX) 40 MG tablet Take 40 mg by mouth 2 (two) times daily.    ibuprofen (ADVIL,MOTRIN) 600 MG tablet Take 600 mg by mouth every 6 (six) hours as needed for moderate pain.    ipratropium-albuterol (DUONEB) 0.5-2.5 (3) MG/3ML SOLN Take 3 mLs by nebulization 4 (four) times daily. Qty: 120 mL, Refills: 5    isosorbide mononitrate (IMDUR) 30 MG 24 hr tablet Take 1 tablet (30 mg total) by mouth daily. Qty: 30 tablet, Refills: 11   Associated Diagnoses: Coronary artery disease involving native coronary artery of native heart without angina pectoris; Essential hypertension    metoprolol succinate (TOPROL-XL) 25 MG 24 hr tablet TAKE 2 TABLETS BY MOUTH IN THE MORNING AND 1 TABLET BY MOUTH IN THE THE EVENING    Multiple  Vitamin (MULTIVITAMIN) capsule Take 1 capsule by mouth daily.      nitroGLYCERIN (NITROSTAT) 0.4 MG SL tablet Place 1 tablet (0.4 mg total) under the tongue every 5 (five) minutes as needed for chest pain. Qty: 25 tablet, Refills: 3   Associated Diagnoses: Coronary artery disease involving native coronary artery of native heart without angina pectoris    Omega-3 Fatty Acids ([name] OIL) 1200 MG CAPS Take 1 capsule by mouth daily.      simvastatin (ZOCOR) 20 MG tablet Take 1 tablet (20 mg total) by mouth daily at 6 PM. Qty: 30 tablet, Refills: 2   Associated Diagnoses: Hyperlipidemia    sodium chloride (OCEAN) 0.65 % SOLN nasal spray Place 1 spray into  both nostrils as needed for congestion. Refills: 0       Allergies  Allergen Reactions  . Lisinopril Swelling   Follow-up Information    Andrew Hatchet, MD. Schedule an appointment as soon as possible for a visit in 1 week(s).   Specialty:  Internal Medicine Why:  Hospital follow up Contact information: [place] [address] [phone]        Noralee Space, MD. Schedule an appointment as soon as possible for a visit in 2 week(s).   Specialty:  Pulmonary Disease Why:  Hospital follow up Contact information: [address] [place] [address] [phone]            The results of significant diagnostics from this hospitalization (including imaging, microbiology, ancillary and laboratory) are listed below for reference.    Significant Diagnostic Studies: Dg Chest 2 View  Result Date: 09/26/2016 CLINICAL DATA:  Increased shortness of breath EXAM: CHEST  2 VIEW COMPARISON:  September 24, 2016 FINDINGS: Increasing bibasilar opacities, consistent with atelectasis versus infiltrate. Recommend follow-up to resolution. No other changes. IMPRESSION: Increasing bibasilar opacities, consistent with atelectasis versus infiltrate. Recommend follow-up to resolution. Electronically Signed   By: Dorise Bullion III M.D   On: 09/26/2016 11:59   Dg Chest Portable 1 View  Result Date: 09/24/2016 CLINICAL DATA:  Shortness of breath.  Respiratory distress. EXAM: PORTABLE CHEST 1 VIEW COMPARISON:  Chest radiograph 10/20/2015 FINDINGS: Bilateral upper lobe bullous disease is again seen. There are diffuse reticulonodular opacities in the lung bases compatible with a degree of pulmonary fibrosis. Increased opacity in these areas suggest a degree of superimposed pulmonary edema. Cardiomediastinal contours are unchanged with persistent atherosclerotic calcification in the aortic arch. No pneumothorax or sizable pleural effusion. IMPRESSION: 1. Increased airspace opacities  superimposed on a background of chronic lung disease, suggestive of pulmonary edema. 2. Aortic atherosclerosis. 3. Bilateral upper lobe bullous disease. Electronically Signed   By: Ulyses Jarred M.D.   On: 09/24/2016 00:49    Microbiology: Recent Results (from the past 240 hour(s))  Respiratory Panel by PCR     Status: None   Collection Time: 09/24/16  2:50 AM  Result Value Ref Range Status   Adenovirus NOT DETECTED NOT DETECTED Final   Coronavirus 229E NOT DETECTED NOT DETECTED Final   Coronavirus HKU1 NOT DETECTED NOT DETECTED Final   Coronavirus NL63 NOT DETECTED NOT DETECTED Final   Coronavirus OC43 NOT DETECTED NOT DETECTED Final   Metapneumovirus NOT DETECTED NOT DETECTED Final   Rhinovirus / Enterovirus NOT DETECTED NOT DETECTED Final   Influenza A NOT DETECTED NOT DETECTED Final   Influenza B NOT DETECTED NOT DETECTED Final   Parainfluenza Virus 1 NOT DETECTED NOT DETECTED Final   Parainfluenza Virus 2 NOT DETECTED NOT DETECTED  Final   Parainfluenza Virus 3 NOT DETECTED NOT DETECTED Final   Parainfluenza Virus 4 NOT DETECTED NOT DETECTED Final   Respiratory Syncytial Virus NOT DETECTED NOT DETECTED Final   Bordetella pertussis NOT DETECTED NOT DETECTED Final   Chlamydophila pneumoniae NOT DETECTED NOT DETECTED Final   Mycoplasma pneumoniae NOT DETECTED NOT DETECTED Final  MRSA PCR Screening     Status: None   Collection Time: 09/24/16  4:41 AM  Result Value Ref Range Status   MRSA by PCR NEGATIVE NEGATIVE Final    Comment:        The GeneXpert MRSA Assay (FDA approved for NASAL specimens only), is one component of a comprehensive MRSA colonization surveillance program. It is not intended to diagnose MRSA infection nor to guide or monitor treatment for MRSA infections.   Culture, sputum-assessment     Status: None   Collection Time: 09/24/16  5:32 AM  Result Value Ref Range Status   Specimen Description EXPECTORATED SPUTUM  Final   Special Requests NONE  Final    Sputum evaluation THIS SPECIMEN IS ACCEPTABLE FOR SPUTUM CULTURE  Final   Report Status 09/24/2016 FINAL  Final  Culture, respiratory (NON-Expectorated)     Status: None   Collection Time: 09/24/16  5:32 AM  Result Value Ref Range Status   Specimen Description SPU  Final   Special Requests NONE  Final   Gram Stain   Final    MODERATE WBC PRESENT, PREDOMINANTLY PMN FEW SQUAMOUS EPITHELIAL CELLS PRESENT MODERATE GRAM POSITIVE COCCI IN PAIRS IN CLUSTERS FEW GRAM NEGATIVE RODS FEW GRAM POSITIVE RODS    Culture Consistent with normal respiratory flora.  Final   Report Status 09/26/2016 FINAL  Final     Labs: Basic Metabolic Panel:  Recent Labs Lab 09/24/16 0011 09/27/16 1008 09/28/16 0221 09/28/16 0837 09/29/16 0318  NA 139 137 138  --  137  K 3.8 4.1 4.7  --  5.0  CL 100* 98* 97*  --  95*  CO2 30 29 33*  --  34*  GLUCOSE 166* 316* 240*  --  240*  BUN 10 31* 35*  --  28*  CREATININE 0.74 1.11 1.09  --  1.00  CALCIUM 9.0 8.6* 8.9  --  8.4*  MG  --   --   --  2.2 2.1   Liver Function Tests: No results for input(s): AST, ALT, ALKPHOS, BILITOT, PROT, ALBUMIN in the last 168 hours. No results for input(s): LIPASE, AMYLASE in the last 168 hours. No results for input(s): AMMONIA in the last 168 hours. CBC:  Recent Labs Lab 09/24/16 0011 09/25/16 0341 09/28/16 0221  WBC 13.1* 13.2* 12.8*  NEUTROABS 10.5*  --  10.1*  HGB 11.4* 10.2* 11.8*  HCT 36.7* 32.1* 37.3*  MCV 101.4* 101.3* 99.7  PLT 149* 150 184   Cardiac Enzymes: No results for input(s): CKTOTAL, CKMB, CKMBINDEX, TROPONINI in the last 168 hours. BNP: BNP (last 3 results)  Recent Labs  10/20/15 1157 09/24/16 0011  BNP 498.4* 29.3    ProBNP (last 3 results) No results for input(s): PROBNP in the last 8760 hours.  CBG: No results for input(s): GLUCAP in the last 168 hours.     SignedCristal Ford  [facility] 09/29/2016, 2:34 PM

## 2016-09-29 NOTE — Progress Notes (Signed)
*  PRELIMINARY RESULTS* Echocardiogram 2D Echocardiogram has been performed.  [name] 09/29/2016, 11:23 AM

## 2016-09-29 NOTE — Discharge Instructions (Signed)
Chronic Obstructive Pulmonary Disease Chronic obstructive pulmonary disease (COPD) is a common lung condition in which airflow from the lungs is limited. COPD is a general term that can be used to describe many different lung problems that limit airflow, including both chronic bronchitis and emphysema. If you have COPD, your lung function will probably never return to normal, but there are measures you can take to improve lung function and make yourself feel better. What are the causes?  Smoking (common).  Exposure to secondhand smoke.  Genetic problems.  Chronic inflammatory lung diseases or recurrent infections. What are the signs or symptoms?  Shortness of breath, especially with physical activity.  Deep, persistent (chronic) cough with a large amount of thick mucus.  Wheezing.  Rapid breaths (tachypnea).  Gray or bluish discoloration (cyanosis) of the skin, especially in your fingers, toes, or lips.  Fatigue.  Weight loss.  Frequent infections or episodes when breathing symptoms become much worse (exacerbations).  Chest tightness. How is this diagnosed? Your health care provider will take a medical history and perform a physical examination to diagnose COPD. Additional tests for COPD may include:  Lung (pulmonary) function tests.  Chest X-ray.  CT scan.  Blood tests. How is this treated? Treatment for COPD may include:  Inhaler and nebulizer medicines. These help manage the symptoms of COPD and make your breathing more comfortable.  Supplemental oxygen. Supplemental oxygen is only helpful if you have a low oxygen level in your blood.  Exercise and physical activity. These are beneficial for nearly all people with COPD.  Lung surgery or transplant.  Nutrition therapy to gain weight, if you are underweight.  Pulmonary rehabilitation. This may involve working with a team of health care providers and specialists, such as respiratory, occupational, and physical  therapists. Follow these instructions at home:  Take all medicines (inhaled or pills) as directed by your health care provider.  Avoid over-the-counter medicines or cough syrups that dry up your airway (such as antihistamines) and slow down the elimination of secretions unless instructed otherwise by your health care provider.  If you are a smoker, the most important thing that you can do is stop smoking. Continuing to smoke will cause further lung damage and breathing trouble. Ask your health care provider for help with quitting smoking. He or she can direct you to community resources or hospitals that provide support.  Avoid exposure to irritants such as smoke, chemicals, and fumes that aggravate your breathing.  Use oxygen therapy and pulmonary rehabilitation if directed by your health care provider. If you require home oxygen therapy, ask your health care provider whether you should purchase a pulse oximeter to measure your oxygen level at home.  Avoid contact with individuals who have a contagious illness.  Avoid extreme temperature and humidity changes.  Eat healthy foods. Eating smaller, more frequent meals and resting before meals may help you maintain your strength.  Stay active, but balance activity with periods of rest. Exercise and physical activity will help you maintain your ability to do things you want to do.  Preventing infection and hospitalization is very important when you have COPD. Make sure to receive all the vaccines your health care provider recommends, especially the pneumococcal and influenza vaccines. Ask your health care provider whether you need a pneumonia vaccine.  Learn and use relaxation techniques to manage stress.  Learn and use controlled breathing techniques as directed by your health care provider. Controlled breathing techniques include: 1. Pursed lip breathing. Start by breathing in (inhaling)  through your nose for 1 second. Then, purse your lips as  if you were going to whistle and breathe out (exhale) through the pursed lips for 2 seconds. 2. Diaphragmatic breathing. Start by putting one hand on your abdomen just above your waist. Inhale slowly through your nose. The hand on your abdomen should move out. Then purse your lips and exhale slowly. You should be able to feel the hand on your abdomen moving in as you exhale.  Learn and use controlled coughing to clear mucus from your lungs. Controlled coughing is a series of short, progressive coughs. The steps of controlled coughing are: 1. Lean your head slightly forward. 2. Breathe in deeply using diaphragmatic breathing. 3. Try to hold your breath for 3 seconds. 4. Keep your mouth slightly open while coughing twice. 5. Spit any mucus out into a tissue. 6. Rest and repeat the steps once or twice as needed. Contact a health care provider if:  You are coughing up more mucus than usual.  There is a change in the color or thickness of your mucus.  Your breathing is more labored than usual.  Your breathing is faster than usual. Get help right away if:  You have shortness of breath while you are resting.  You have shortness of breath that prevents you from:  Being able to talk.  Performing your usual physical activities.  You have chest pain lasting longer than 5 minutes.  Your skin color is more cyanotic than usual.  You measure low oxygen saturations for longer than 5 minutes with a pulse oximeter. This information is not intended to replace advice given to you by your health care provider. Make sure you discuss any questions you have with your health care provider. Document Released: 08/11/2005 Document Revised: 04/08/2016 Document Reviewed: 06/28/2013 Elsevier Interactive Patient Education  2017 [place].   Acute Respiratory Failure, Adult Acute respiratory failure occurs when there is not enough oxygen passing from your lungs to your body. When this happens, your lungs  have trouble removing carbon dioxide from the blood. This causes your blood oxygen level to drop too low as carbon dioxide builds up. Acute respiratory failure is a medical emergency. It can develop quickly, but it is temporary if treated promptly. Your lung capacity, or how much air your lungs can hold, may improve with time, exercise, and treatment. What are the causes? There are many possible causes of acute respiratory failure, including:  Lung injury.  Chest injury or damage to the ribs or tissues near the lungs.  Lung conditions that affect the flow of air and blood into and out of the lungs, such as pneumonia, acute respiratory distress syndrome, and cystic fibrosis.  Medical conditions, such as strokes or spinal cord injuries, that affect the muscles and nerves that control breathing.  Blood infection (sepsis).  Inflammation of the pancreas (pancreatitis).  A blood clot in the lungs (pulmonary embolism).  A large-volume blood transfusion.  Burns.  Near-drowning.  Seizure.  Smoke inhalation.  Reaction to medicines.  Alcohol or drug overdose. What increases the risk? This condition is more likely to develop in people who have:  A blocked airway.  Asthma.  A condition or disease that damages or weakens the muscles, nerves, bones, or tissues that are involved in breathing.  A serious infection.  A health problem that blocks the unconscious reflex that is involved in breathing, such as hypothyroidism or sleep apnea.  A lung injury or trauma. What are the signs or symptoms? Trouble  breathing is the main symptom of acute respiratory failure. Symptoms may also include:  Rapid breathing.  Restlessness or anxiety.  Skin, lips, or fingernails that appear blue (cyanosis).  Rapid heart rate.  Abnormal heart rhythms (arrhythmias).  Confusion or changes in behavior.  Tiredness or loss of energy.  Feeling sleepy or having a loss of consciousness. How is this  diagnosed? Your health care provider can diagnose acute respiratory failure with a medical history and physical exam. During the exam, your health care provider will listen to your heart and check for crackling or wheezing sounds in your lungs. Your may also have tests to confirm the diagnosis and determine what is causing respiratory failure. These tests may include:  Measuring the amount of oxygen in your blood (pulse oximetry). The measurement comes from a small device that is placed on your finger, earlobe, or toe.  Other blood tests to measure blood gases and to look for signs of infection.  Sampling your cerebral spinal fluid or tracheal fluid to check for infections.  Chest X-ray to look for fluid in spaces that should be filled with air.  Electrocardiogram (ECG) to look at the heart's electrical activity. How is this treated? Treatment for this condition usually takes places in a hospital intensive care unit (ICU). Treatment depends on what is causing the condition. It may include one or more treatments until your symptoms improve. Treatment may include:  Supplemental oxygen. Extra oxygen is given through a tube in the nose, a face mask, or a hood.  A device such as a continuous positive airway pressure (CPAP) or bi-level positive airway pressure (BiPAP or BPAP) machine. This treatment uses mild air pressure to keep the airways open. A mask or other device will be placed over your nose or mouth. A tube that is connected to a motor will deliver oxygen through the mask.  Ventilator. This treatment helps move air into and out of the lungs. This may be done with a bag and mask or a machine. For this treatment, a tube is placed in your windpipe (trachea) so air and oxygen can flow to the lungs.  Extracorporeal membrane oxygenation (ECMO). This treatment temporarily takes over the function of the heart and lungs, supplying oxygen and removing carbon dioxide. ECMO gives the lungs a chance to  recover. It may be used if a ventilator is not effective.  Tracheostomy. This is a procedure that creates a hole in the neck to insert a breathing tube.  Receiving fluids and medicines.  Rocking the bed to help breathing. Follow these instructions at home:  Take over-the-counter and prescription medicines only as told by your health care provider.  Return to normal activities as told by your health care provider. Ask your health care provider what activities are safe for you.  Keep all follow-up visits as told by your health care provider. This is important. How is this prevented? Treating infections and medical conditions that may lead to acute respiratory failure can help prevent the condition from developing. Contact a health care provider if:  You have a fever.  Your symptoms do not improve or they get worse. Get help right away if:  You are having trouble breathing.  You lose consciousness.  Your have cyanosis or turn blue.  You develop a rapid heart rate.  You are confused. These symptoms may represent a serious problem that is an emergency. Do not wait to see if the symptoms will go away. Get medical help right away. Call your local  emergency services (911 in the [place] Do not drive yourself to the hospital.  This information is not intended to replace advice given to you by your health care provider. Make sure you discuss any questions you have with your health care provider. Document Released: 11/06/2013 Document Revised: 05/29/2016 Document Reviewed: 05/19/2016 Elsevier Interactive Patient Education  2017 [place].  Heart Failure Heart failure is a condition in which the heart has trouble pumping blood because it has become weak or stiff. This means that the heart does not pump blood efficiently for the body to work well. For some people with heart failure, fluid may back up into the lungs and there may be swelling (edema) in the lower legs. Heart failure is  usually a long-term (chronic) condition. It is important for you to take good care of yourself and follow the treatment plan from your health care provider. What are the causes? This condition is caused by some health problems, including:  High blood pressure (hypertension). Hypertension causes the heart muscle to work harder than normal. High blood pressure eventually causes the heart to become stiff and weak.  Coronary artery disease (CAD). CAD is the buildup of cholesterol and fat (plaques) in the arteries of the heart.  Heart attack (myocardial infarction). Injured tissue, which is caused by the heart attack, does not contract as well and the heart's ability to pump blood is weakened.  Abnormal heart valves. When the heart valves do not open and close properly, the heart muscle must pump harder to keep the blood flowing.  Heart muscle disease (cardiomyopathy or myocarditis). Heart muscle disease is damage to the heart muscle from a variety of causes, such as drug or alcohol abuse, infections, or unknown causes. These can increase the risk of heart failure.  Lung disease. When the lungs do not work properly, the heart must work harder. What increases the risk? Risk of heart failure increases as a person ages. This condition is also more likely to develop in people who:  Are overweight.  Are male.  Smoke or chew tobacco.  Abuse alcohol or illegal drugs.  Have taken medicines that can damage the heart, such as chemotherapy drugs.  Have diabetes.  High blood sugar (glucose) is associated with high fat (lipid) levels in the blood.  Diabetes can also damage tiny blood vessels that carry nutrients to the heart muscle.  Have abnormal heart rhythms.  Have thyroid problems.  Have low blood counts (anemia). What are the signs or symptoms? Symptoms of this condition include:  Shortness of breath with activity, such as when climbing stairs.  Persistent cough.  Swelling of the  feet, ankles, legs, or abdomen.  Unexplained weight gain.  Difficulty breathing when lying flat (orthopnea).  Waking from sleep because of the need to sit up and get more air.  Rapid heartbeat.  Fatigue and loss of energy.  Feeling light-headed, dizzy, or close to fainting.  Loss of appetite.  Nausea.  Increased urination during the night (nocturia).  Confusion. How is this diagnosed? This condition is diagnosed based on:  Medical history, symptoms, and a physical exam.  Diagnostic tests, which may include:  Echocardiogram.  Electrocardiogram (ECG).  Chest X-ray.  Blood tests.  Exercise stress test.  Radionuclide scans.  Cardiac catheterization and angiogram. How is this treated? Treatment for this condition is aimed at managing the symptoms of heart failure. Medicines, behavioral changes, or other treatments may be necessary to treat heart failure. Medicines  These may include:  Angiotensin-converting enzyme (ACE)  inhibitors. This type of medicine blocks the effects of a blood protein called angiotensin-converting enzyme. ACE inhibitors relax (dilate) the blood vessels and help to lower blood pressure.  Angiotensin receptor blockers (ARBs). This type of medicine blocks the actions of a blood protein called angiotensin. ARBs dilate the blood vessels and help to lower blood pressure.  Water pills (diuretics). Diuretics cause the kidneys to remove salt and water from the blood. The extra fluid is removed through urination, leaving a lower volume of blood that the heart has to pump.  Beta blockers. These improve heart muscle strength and they prevent the heart from beating too quickly.  Digoxin. This increases the force of the heartbeat. Healthy behavior changes  These may include:  Reaching and maintaining a healthy weight.  Stopping smoking or chewing tobacco.  Eating heart-healthy foods.  Limiting or avoiding alcohol.  Stopping use of street drugs  (illegal drugs).  Physical activity. Other treatments  These may include:  Surgery to open blocked coronary arteries or repair damaged heart valves.  Placement of a biventricular pacemaker to improve heart muscle function (cardiac resynchronization therapy). This device paces both the right ventricle and left ventricle.  Placement of a device to treat serious abnormal heart rhythms (implantable cardioverter defibrillator, or ICD).  Placement of a device to improve the pumping ability of the heart (left ventricular assist device, or LVAD).  Heart transplant. This can cure heart failure, and it is considered for certain patients who do not improve with other therapies. Follow these instructions at home: Medicines  Take over-the-counter and prescription medicines only as told by your health care provider. Medicines are important in reducing the workload of your heart, slowing the progression of heart failure, and improving your symptoms.  Do not stop taking your medicine unless your health care provider told you to do that.  Do not skip any dose of medicine.  Refill your prescriptions before you run out of medicine. You need your medicines every day. Eating and drinking  Eat heart-healthy foods. Talk with a dietitian to make an eating plan that is right for you.  Choose foods that contain no trans fat and are low in saturated fat and cholesterol. Healthy choices include fresh or frozen fruits and vegetables, fish, lean meats, legumes, fat-free or low-fat dairy products, and whole-grain or high-fiber foods.  Limit salt (sodium) if directed by your health care provider. Sodium restriction may reduce symptoms of heart failure. Ask a dietitian to recommend heart-healthy seasonings.  Use healthy cooking methods instead of frying. Healthy methods include roasting, grilling, broiling, baking, poaching, steaming, and stir-frying.  Limit your fluid intake if directed by your health care  provider. Fluid restriction may reduce symptoms of heart failure. Lifestyle  Stop smoking or using chewing tobacco. Nicotine and tobacco can damage your heart and your blood vessels. Do not use nicotine gum or patches before talking to your health care provider.  Limit alcohol intake to no more than 1 drink per day for non-pregnant women and 2 drinks per day for men. One drink equals 12 oz of beer, 5 oz of wine, or 1 oz of hard liquor.  Drinking more than that is harmful to your heart. Tell your health care provider if you drink alcohol several times a week.  Talk with your health care provider about whether any level of alcohol use is safe for you.  If your heart has already been damaged by alcohol or you have severe heart failure, drinking alcohol should be stopped completely.  Stop use of illegal drugs.  Lose weight if directed by your health care provider. Weight loss may reduce symptoms of heart failure.  Do moderate physical activity if directed by your health care provider. People who are elderly and people with severe heart failure should consult with a health care provider for physical activity recommendations. Monitor important information  Weigh yourself every day. Keeping track of your weight daily helps you to notice excess fluid sooner.  Weigh yourself every morning after you urinate and before you eat breakfast.  Wear the same amount of clothing each time you weigh yourself.  Record your daily weight. Provide your health care provider with your weight record.  Monitor and record your blood pressure as told by your health care provider.  Check your pulse as told by your health care provider. Dealing with extreme temperatures  If the weather is extremely hot:  Avoid vigorous physical activity.  Use air conditioning or fans or seek a cooler location.  Avoid caffeine and alcohol.  Wear loose-fitting, lightweight, and light-colored clothing.  If the weather is  extremely cold:  Avoid vigorous physical activity.  Layer your clothes.  Wear mittens or gloves, a hat, and a scarf when you go outside.  Avoid alcohol. General instructions  Manage other health conditions such as hypertension, diabetes, thyroid disease, or abnormal heart rhythms as told by your health care provider.  Learn to manage stress. If you need help to do this, ask your health care provider.  Plan rest periods when fatigued.  Get ongoing education and support as needed.  Participate in or seek rehabilitation as needed to maintain or improve independence and quality of life.  Stay up to date with immunizations. Keeping current on pneumococcal and influenza immunizations is especially important to prevent respiratory infections.  Keep all follow-up visits as told by your health care provider. This is important. Contact a health care provider if:  You have a rapid weight gain.  You have increasing shortness of breath that is unusual for you.  You are unable to participate in your usual physical activities.  You tire easily.  You cough more than normal, especially with physical activity.  You have any swelling or more swelling in areas such as your hands, feet, ankles, or abdomen.  You are unable to sleep because it is hard to breathe.  You feel like your heart is beating quickly (palpitations).  You become dizzy or light-headed when you stand up. Get help right away if:  You have difficulty breathing.  You notice or your family notices a change in your awareness, such as having trouble staying awake or having difficulty with concentration.  You have pain or discomfort in your chest.  You have an episode of fainting (syncope). This information is not intended to replace advice given to you by your health care provider. Make sure you discuss any questions you have with your health care provider. Document Released: 11/01/2005 Document Revised: 07/06/2016  Document Reviewed: 05/26/2016 Elsevier Interactive Patient Education  2017 [employer].

## 2016-09-29 NOTE — Care Management Note (Signed)
Case Management Note  Patient Details  Name: [name] MRN: [id number] Date of Birth: Sep 19, 1946  Subjective/Objective:   Pt to discharge home on BiPAP, sleep study scheduled for Tuesday, January 9th @ 8:00 p.m.  Information given to pt, order faxed to [facility].                        Expected Discharge Plan:  Home/Self Care  Discharge planning Services  CM Consult  Post Acute Care Choice:  Durable Medical Equipment  DME Arranged:  [facility] DME Agency:  [facility]  [place] Arranged:  Patient Refused  Status of Service:  Completed, signed off  Girard Cooter, [place] 09/29/2016, 3:00 PM

## 2016-09-29 NOTE — Progress Notes (Signed)
Physical Therapy Treatment Patient Details Name: [name] MRN: [id number] DOB: 1945/12/13 Today's Date: 09/29/2016    History of Present Illness Pt adm with acute on chronic respiratory failure and COPD exacerbation. Pt required bipap. PMH - COPD, bladder CA, HTN, CAD, anxiety,     PT Comments    Pt presented sitting EOB in the process of a breathing treatment with RT. Pt's wife was present throughout session as well. Pt progressing with ambulation on 8L O2 via [place]. At rest, pt on 4L O2 via [place]. Pt's SPO2 decreased to 86% but rebounded quickly (within 30 seconds) with a standing rest break and pursed-lip breathing. Pt would continue to benefit from skilled physical therapy services at this time while admitted to address his limitations in order to improve his overall safety and independence with functional mobility.   Follow Up Recommendations  No PT follow up     Equipment Recommendations  None recommended by PT    Recommendations for Other Services       Precautions / Restrictions Precautions Precautions: Other (comment) Precaution Comments: watch SpO2 Restrictions Weight Bearing Restrictions: No    Mobility  Bed Mobility               General bed mobility comments: pt sitting EOB when PT entered room  Transfers Overall transfer level: Needs assistance Equipment used: Rolling walker (2 wheeled) Transfers: Sit to/from Stand Sit to Stand: Supervision         General transfer comment: supervision for safety  Ambulation/Gait Ambulation/Gait assistance: Supervision Ambulation Distance (Feet): 100 Feet Assistive device: Rolling walker (2 wheeled) Gait Pattern/deviations: Step-through pattern Gait velocity: decreased Gait velocity interpretation: Below normal speed for age/gender General Gait Details: pt required 2 standing rest breaks during ambulation secondary to decrease in SPO2 to 86%, rebounding quickly with pursed lip breathing. pt ambulated on 8L O2  via [place].   Stairs            Wheelchair Mobility    Modified Rankin (Stroke Patients Only)       Balance Overall balance assessment: Needs assistance Sitting-balance support: Feet supported;No upper extremity supported Sitting balance-Leahy Scale: Good     Standing balance support: During functional activity;No upper extremity supported Standing balance-Leahy Scale: Fair                      Cognition Arousal/Alertness: Awake/alert Behavior During Therapy: WFL for tasks assessed/performed Overall Cognitive Status: Within Functional Limits for tasks assessed                      Exercises      General Comments        Pertinent Vitals/Pain Pain Assessment: No/denies pain    Home Living                      Prior Function            PT Goals (current goals can now be found in the care plan section) Acute Rehab PT Goals Patient Stated Goal: return home PT Goal Formulation: With patient Time For Goal Achievement: 10/01/16 Potential to Achieve Goals: Good Progress towards PT goals: Progressing toward goals    Frequency    Min 3X/week      PT Plan Current plan remains appropriate    Co-evaluation             End of Session Equipment Utilized During Treatment: Gait belt;Oxygen Activity Tolerance: Other (comment) (  limited secondary to dyspnea) Patient left: in chair;with call bell/phone within reach;with family/visitor present     Time: 8938-1017 PT Time Calculation (min) (ACUTE ONLY): 20 min  Charges:  $Gait Training: 8-22 mins                    G CodesClearnce Sorrel Lilliahna Schubring 10-12-2016, 4:28 PM Sherie Don, [place], DPT [phone]

## 2016-09-29 NOTE — Care Management Important Message (Signed)
Important Message  Patient Details  Name: [name] MRN: [id number] Date of Birth: 09-Apr-1946   Medicare Important Message Given:  Yes    Even Budlong Abena 09/29/2016, 10:20 AM

## 2016-09-30 ENCOUNTER — Encounter: Payer: Self-pay | Admitting: *Deleted

## 2016-09-30 ENCOUNTER — Other Ambulatory Visit: Payer: Self-pay

## 2016-09-30 NOTE — Patient Outreach (Signed)
New referral for transition of care: Discharge date of 09/29/2016.  Placed call to home, cell phone with no answer. Left a message requesting a return call. Placed call to wife cell phone ( on consent) no answer. No message left.  PLAN: Will await a call back. Will attempt again tomorrow.  Tomasa Rand, RN, BSN, CEN [facility] [employer] [phone]

## 2016-10-01 ENCOUNTER — Other Ambulatory Visit (HOSPITAL_BASED_OUTPATIENT_CLINIC_OR_DEPARTMENT_OTHER): Payer: Self-pay

## 2016-10-01 ENCOUNTER — Other Ambulatory Visit: Payer: Self-pay

## 2016-10-01 DIAGNOSIS — G4730 Sleep apnea, unspecified: Secondary | ICD-10-CM

## 2016-10-01 NOTE — Patient Outreach (Signed)
Transition of care call: Placed call to patient who answered. ID confirmed. Reviewed reasons for call. Patient reports that he is doing well. States that he got a good night sleep last night.  Reports that he has follow up planned with primary MD later today. Reports no appointment yet with pulmonary. Patient states that he is waiting to talk with primary MD today prior to making appointment.  Reports that he is doing well with his BIPAP at home. Denies any problems or concerns today.  Reports he is taking his medications without problems.  PLAN: will outreach patient weekly for transition of care. Provided my contact information for patient to call if needed. Will send this note to MD and barrier letter. Next outreach planned for 10/05/2016   [facility] CM Care Plan Problem One   Flowsheet Row Most Recent Value  Care Plan Problem One  Recent hospital admission for shortness of breath.  Role Documenting the Problem One  Care Management [place] for Problem One  Active  [facility] Long Term Goal (31-90 days)  Patient will report no readmission for shortness of breath in the next 31 days.   [facility] Long Term Goal Start Date  10/01/16  Interventions for Problem One Long Term Goal  reviewed [facility] transition of care program. Provided my contact information. Reviewed discharge instructions.  [facility] CM Short Term Goal #1 (0-30 days)  Patient will see pulmonary MD in the next 2 weeks   [facility] CM Short Term Goal #1 Start Date  10/01/16  Interventions for Short Term Goal #1  Reviewed importance of timely follow up with specialist.  Patient to discuss with primary Md today.  [facility] CM Short Term Goal #2 (0-30 days)  Patient will report taking all medications as prescribed in the next 30 days.  [facility] CM Short Term Goal #2 Start Date  10/01/16  Interventions for Short Term Goal #2  reviewed all discharge medications and reasons for taking. encouraged paitent not to run out of meds.     Tomasa Rand, RN, BSN, CEN [facility]  [employer] [phone]

## 2016-10-05 ENCOUNTER — Other Ambulatory Visit: Payer: Self-pay

## 2016-10-05 ENCOUNTER — Encounter: Payer: Self-pay | Admitting: *Deleted

## 2016-10-05 NOTE — Patient Outreach (Signed)
Transition of care call: Placed call to patient for weekly transition of care: Patient reports to me that he is doing very well. Reports that he had his hospital follow up with primary MD last week and that his lungs were clear. Reports to me that he is using his BIPAP nightly and is sleeping well.  Reports that he is taking his nebs on a scheduled about every 4 hours. Denies any new problems or concerns.    PLAN: offered home visit however patient not willing to schedule today. Reviewed pending appointment with Dr. Lenna Gilford per EPIC. Patient requested phone call follow up next week.   Will plan to call patient in 7 days.  Tomasa Rand, RN, BSN, CEN [facility] [employer] [phone]

## 2016-10-05 NOTE — Telephone Encounter (Signed)
This encounter was created in error - please disregard.

## 2016-10-05 NOTE — Progress Notes (Signed)
This encounter was created in error - please disregard.

## 2016-10-12 ENCOUNTER — Other Ambulatory Visit: Payer: Self-pay

## 2016-10-12 NOTE — Patient Outreach (Signed)
Transition of care call: Placed call to patient who answered and identified himself. Patient reports that he is doing well. States no problems with breathing. Reports that he is using his BIPAP and "sleeping like a log".   Denies any new problems or concerns.   Reviewed pending pulmonary appointment for tomorrow.  PLAN: Will follow up with patient in 1 week.  Tomasa Rand, RN, BSN, CEN [facility] [employer] [phone]

## 2016-10-13 ENCOUNTER — Ambulatory Visit (INDEPENDENT_AMBULATORY_CARE_PROVIDER_SITE_OTHER): Payer: Medicare Other | Admitting: Pulmonary Disease

## 2016-10-13 VITALS — BP 120/70 | HR 76 | Temp 96.6°F | Ht 66.0 in | Wt 240.0 lb

## 2016-10-13 DIAGNOSIS — I272 Pulmonary hypertension, unspecified: Secondary | ICD-10-CM

## 2016-10-13 DIAGNOSIS — I10 Essential (primary) hypertension: Secondary | ICD-10-CM

## 2016-10-13 DIAGNOSIS — I2510 Atherosclerotic heart disease of native coronary artery without angina pectoris: Secondary | ICD-10-CM

## 2016-10-13 DIAGNOSIS — J449 Chronic obstructive pulmonary disease, unspecified: Secondary | ICD-10-CM

## 2016-10-13 DIAGNOSIS — J9612 Chronic respiratory failure with hypercapnia: Secondary | ICD-10-CM

## 2016-10-13 DIAGNOSIS — G4733 Obstructive sleep apnea (adult) (pediatric): Secondary | ICD-10-CM

## 2016-10-13 DIAGNOSIS — E662 Morbid (severe) obesity with alveolar hypoventilation: Secondary | ICD-10-CM

## 2016-10-13 DIAGNOSIS — J9611 Chronic respiratory failure with hypoxia: Secondary | ICD-10-CM | POA: Diagnosis not present

## 2016-10-13 DIAGNOSIS — Z6839 Body mass index (BMI) 39.0-39.9, adult: Secondary | ICD-10-CM

## 2016-10-13 DIAGNOSIS — I420 Dilated cardiomyopathy: Secondary | ICD-10-CM

## 2016-10-13 NOTE — Progress Notes (Signed)
Subjective:    Patient ID: [name], male    DOB: 1946/04/17, 70 y.o.   MRN: [id number]  HPI 70 y/o WM here for a follow up visit and on-going management of mult med problems...  ~  SEE PREV EPIC NOTES FOR OLDER DATA >>    CXR 7/14 showed COPD/ emphysema, incr markings and scarring at the bases, NAD...  LABS 7/14:  FLP- ok on Simva20;  Chems- ok x BS=120 A1c=6.7;  CBC- wnl;  TSH=1.26;  PSA=0.41...  [name] has established Primary Care follow up w/ [name] at [facility]>>   CXR 7/15 showed underlying COPD, mild bibasilar fibrosis, norm heart size, DJD in spine, NAD...   PFT 7/15 showed> FVC=3.18 (78%), FEV1=1.22 (38%), %1sec=38; mid-flows=13% predicted... This is c/w GOLD Stage3 COPD and we discussed this...  ~  December 05, 2014:  2moROV & pulmonary recheck; ETomazhas continued smoking, perhaps decr slightly to 1/2ppd but unable to quit & he has gained 6# in the interim up to 221# today;  His CC is sinusitis w/ nasal congestion, drainage & green mucus plus maxillary/ frontal HAs; he denies f/c/s, notes stable cough & chest symptoms w/ DOE;  He has been taking his Advair250Bid but prev refused addition of an anticholinergic due to "the donut hole";  States his breathing is "fine, except in cold air" ( we discussed wearing a scarf in winter etc)... He has GOLD Stage3 COPD w/ FEV1=1.22 (38%predicted) done 7/15;;  Exam shows decr BS bilat w/ few scat rhonchi & mild exp wheezing esp w/ forced maneuver...     We reviewed prob list, meds, xrays and labs> see below for updates >> [facility]... PLAN>> we discussed treating his acute max sinusitis w/ Augmentin875Bid, plus align daily & nasal saline as needed; he knows the importance of smoking cessation but is not motivated & declines smoking cessation help; he will continue the Advair250Bid 7 we are adding spiriva via handihaler once per day; we plan ROV in 663mo/ f/u CXR & PFT,  call in the interim for any breathing issues....   ~  June 05, 2015:  65m27moV & [name] a good interval, no new complaints or concerns; still smoking +/-1ppd and blames family stress; notes cough, sm amy yellow sput, no hemoptysis, SOB w/o change/stable- still mows 3 yards, does chores, tends to 20+chickens & their eggs, etc; he has repeatedly declined smoking cessation help, clearly not motivated to quit, not even doing his inhalers regularly!!!    COPD, +smoker> on Advair250Bid & Spiriva daily; symptoms as above, he is stoic, still smoking 1ppd & NOT motivated to quit, asked to cut back & use both inhalers regularly...    HBP> on Metop25Bid, Amlod10, Lisin40; BP=114/64 & he denies CP, palpit, SOB, edema...    CAD> on ASA81; followed by [name] & seen 4/15 (overdue for f/u)> CAD, IWMI 1995, mult PCIs, last Myoview 2013 was low risk; too sedentary, no changes made; 73 53o brother died w/ AAA rupture...    Chol> on Simva20; FLP 7/14 showed TChol 158, TG 145, HDL 33, LDL 96; needs better diet & wt reduction and ret for Fasting blood work overdue.    DM> on diet alone; LABS 7/14 showed BS= 120, A1c= 6.7; we reviewed low carb diet, exercise program, and wt reduction...    Obese> wt recorded as 204# today but it doesn't look like he's lost 26#; we reviewed diet &  exercise...    GI- Gastritis, polyps> prev on Zantac; prev colon 2004 w/ hyperplastic polyp removed & f/u 8/14 by DrStark w/ 3 polyps removed (5-60m size, all were tubular adenomas), +divertics in sigmoid, f/u planned 5 yrs... NOTE> exam showed a small palp knot in mid-abd above the umbilicus ?etiology (I offered CT Abd but he declines & wants to discuss w/ his primary- [name]).    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    DJD> on OTC analgesics  as needed...    Anxiety> on Alpraz0.18m57mrn... We reviewed prob list, meds, xrays and labs> see below for updates >>  NOTE> HE IS FOLLOWED BY [name] for GEN MED/ PCP now...  CXR 06/05/15 showed stable heart size, Ao atherosclerosis & tortuousiy, COPD/emphysema, incr markings at the bases, arthritis in Tspine...  LABS 7/16: pt asked to ret for fasting blood work... IMP/PLAN>>  [name] he is stable but continues to smoke 1ppd; CXR w/ COPD/emphysema, atherosclerotic Ao, chronic changes but NAD- we discussed the low-dose screening CT Chest for the early detection of lung cancer (he is a candidate & he will consider participating in this program); in the meanwhile- continue Advair/ Spiriva regularly;  He is overdue for Cards f/u w/ [name], and needs to continue Q6mo29mo w/ [name] (w/ copy of notes to us);[place]oreaHe will ret for FASTING blood work.  ADDENDUM>> given his age and smoking history, [name] candidate for LUNG CANCER SCREENING w/ LOW-DOSE CT Chest>>  This office visit constitutes a face-to-face visit for the purpose of lung cancer screening counseling and a shared decision making visit...  Eligibility for LDCT screening:  1) Age=70 (must be 55-77y/o);  2) Absence of symptoms of lung cancer: he denies CP, ch in SOB, hemoptysis.  Smoking History:  1) He has 60+ pk-yrs (must have >30 pk-yr hx smoking);  2) Smoking status- still smoking ~1ppd (current or former smoker who quit<15 yrs ago).  Counseling:  Importance of smoking cessation &/or continued abstinence discussed ; offered smoking cessation interventions- pt declined; pt agreed to continued LDCT screening annually.  Shared decision making:  We reviewed the potential benefits and harms of screening> eg. Early detection of lung cancer and improved survival, the need for additional diagnostic tests & possible surgery, the risk of over-diagnosis/ false positives/ and radiation exposure... All questions were answered to the best  of my ability & he would like to consider the LDCT screening & he will let us kKoreaw his decision=> he ignored this discussion & never called.  ~  October 31, 2015:  5 month ROV & post hospital follow up visit>  [name] was Adm 12/5 - 10/29/15 by [facility] w/ acute hypoxemic resp failure precipitated by a COPD exac; for his part he noted incr SOB, cough, green sput, & "slowing down", he denied f/c/s, no CP, etc;  In the ER he was hypoxemic w/ O2sats in the 70s on RA and eval revealed> CXR- COPD, bullous dis, incr markings at bases, NAD; Cultures of blood/ sput/ urine were all neg; Serologies for strep & legionella were neg; Viral panel was neg;  LABs were OK (WBC=7.9=>14, Hg=15.3=>13.9, BS=100-140, BNP=498);  EKG showed NSR, rate78, NSSTTWA;  2DEcho 10/21/15 showed mildLVH, norm LVF w/ EF=55-60%, no regional wall motion abn, LA-mod  dil, RA- mod to severe dil, RV- mod dil, mod TR, PAsys-70mHg... He was treated w/ Oxygen, IV Solumedrol, Rocephin/ Zithromax, NEBS, Lovenox, flutter, etc; he was disch on Home O2- 2L/min activ & 1L/min rest, Pred taper, Levaquin, Advair250/ Spiriva...  He returns today feeling better, notes his home pulse-ox checks are improving & he has less SOB/DOE, less cough/sput, etc;  He says he has not smoked since PTA...      EXAM shows Afeb, VSS, O2sat=94% on 2L/min [place];  Wt=210# (he was 204 prev);  HEENT- neg, mallampati1;  Chest- decr BS bilat, few scat rhonchi, no w/r/consolidation;  Heart- RR w/o m/r/g;  Abd- obese, soft;  Ext- neg w/o c/c/e...  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    ~  November 21, 2015:  3wk ROV & MrWicker reports that he feels much better, SOB improved, cough improved, still prod of a sm amt green sput w/o blood w/o f/c/s;  He's been using the NEB w/ Albut TID followed by his AdvairBid & SpirivaQd; he is off the Pred now, he is too sedentary & not exercising => he needs PULM REHAB & we will refer, [facility] we will inquire as to what he might be elig for...       EXAM shows Afeb, VSS, O2sat=98% on 2L/min [place];  Wt=216# (he was 204-210# prev);  HEENT- neg, mallampati2;  Chest- decr BS bilat, otherw clear w/o w/r/r;  Heart- RR w/o m/r/g;  Abd- obese, soft;  Ext- neg w/o c/c/e...  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 We plan ROV in 6 wks.  ~  February 03, 2016:  2-376moOV & ErSharman [name] w/ his severe obstructive lung dis, GOLD Stage 3, chr hypoxemic resp failure w/ pulmHTN (WHO group3); he quit smoking 10/2015 & is using home O2 3L/min, NEBS w/ Albut tid, Advair250Bid, Spiriva daily, Mucinex etc... He reports that breathing is stable overall but he is c/o 51m86mo URI w/ cough, thick yellow sput hard to produce, incr SOB 7 chest tightness but no fever, no CP=> he wants another antibiotic... He spent much time ventilating about his problems w/ DME[facility], his O2 set-up, & [facility] program- they still haven't contacted him about starting Rehab... He does have mult entries into EPIC from [employer] reviewed...     EXAM shows Afeb, VSS, O2sat=92% on 3L/min [place];  Wt=231# (up 15# & he was 204-210# prev);  HEENT- neg, mallampati2;  Chest- decr BS bilat, otherw clear w/o w/r/r;  Heart- RR w/o m/r/g;  Abd- obese, soft;  Ext- neg w/o c/c/e.  PFT 02/03/16>  FVC=2.57 (68%), FEV1=1.05 (38%), %1sec=41, mid-flows reduced at 18% predicted; post bronchodil  FEV1 w/o change;  TLC=6.67 (107%), RV=3.97 (176%), RV/TLC=60;  DLCO=22% and DL/VA=29%... This is c/w a severe obstructive ventilatory defect w/ air trapping and severely decr DLCO c/w emphysema- GOLD Stage  3 COPD... IMP/PLAN>>  We discussed continuing the NEBS Tid followed by Advair250Bid & Spiriva daily; add Mucinex 1279mBid & we will Rx w/ Levaquin500/d x7d for his bronchitic infection (see AVS);  He still needs a CT Chest but wants to wait for now;  [facility], and he wants diff DME supplier- we will inquire, plan ROV 378mo. ADDENDUM 03/18/16>> Pt unable to afford inhalers- ADVAIR & SPIRIVA discontinued and placed on NEBULIZER w/ DUONEB QID & PULMICORT 0.25 BID regularly (we will update med list to reflect this change).  ~  May 05, 2016:  54m78moV & [name] notes DOE w/ exertion- he is in PulHealth Netusing up to 6L/min w/ exercise (he tried Oxy[employer] 1 Automotiveusing 3-4L/min at rest & Qhs; he feels that he is making some progress & I reminded him the DIET + EXERCISE and wt loss would be very beneficial to his breathing; states he is walking & mowing on his off days (ie- when not in rehab); despite everything his wt continues to climb- now up 4# to 235# "exercise makes me hungry"... we reviewed the following medical problems during today's office visit >> his new PCP is [name]...    COPD, ex-smoker, chr hypoxemic RF> on NEB w/ Duoneb Qid + Budes-Bid + HFA prn (Advair & Spiriva too $$); he finally quit smoking, symptoms as above, he is now in pulm rehab but having issues w/ low sats...    HBP> on MetopER-78m51mM&1PM), Amlod10, Lisin10, Lasix40Bid; BP=136/66 & he denies CP, palpit, SOB, edema...    CAD> on ASA81, Imdur30; followed by [name] & seen 04/2016> CAD, IWMI 1995, mult PCIs, last Myoview 2013 was low risk; too sedentary, edema decr w/ Lasix; 73 y39 brother died w/ AAA rupture...    Chol> on Simva20; FLP 12/16 showed TChol 138, TG 75, HDL 25, LDL 98; needs better diet & wt  reduction and better compliance.    DM> on diet alone; LABS 5/17 showed BS= 118, A1c= per PCP; we reviewed low carb diet, exercise program, and wt reduction...    Obese> wt = 235#, 5'6"Tall, BMI=38;  we reviewed diet & exercise...    GI- Gastritis, polyps> prev on Zantac; prev colon 2004 w/ hyperplastic polyp removed & f/u 8/14 by DrStark w/ 3 polyps removed (5-7mm 39me, all were tubular adenomas), +divertics in sigmoid, f/u planned 5 yrs... NOTE> exam showed a small palp knot in mid-abd above the umbilicus ?etiology (I offered CT Abd but he declines & wants to discuss w/ his primary- [name]).    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     DJD> on OTC analgesics as needed...    Anxiety> on Alpraz0.5mg pr7m. EXAM shows Afeb, VSS, O2sat=92% on 3L/min [place];  Wt=231# (up 15# & he was 204-210# prev);  HEENT- neg, mallampati3;  Chest- decr BS bilat, otherw clear w/o w/r/r;  Heart- RR w/o m/r/g;  Abd- obese, soft;  Ext- neg w/o c/c/e. IMP/PLAN>>  [name] is rec to continue current meds regularly, get on diet & get wt down , continue in PulmRehab, etc; we wrote for a ProairHFA rescue inhaler for prn use...   ~  August 05, 2016:  54mo ROV59moast visit we rec continuing same meds but stressed DIET, EXERCISE, wt reduction-- unfortunately he  has GAINED 8# up to 243# (BMI=39) in the interval;  He states that "I don't get hungry, I only eat one sandwich & sometimes a salad at night;  He says "they kicked me out" of the [facility] program due to low oxygens even w/ use of the Oxymizer;  He says he is exercising at home now;  He has established PCP w/ [name] & apparently he did labs which pt reports were "OK".    [name] c/o cough, chest congestion, yellow sput, ans a "sinus infection" w/ yellow drainage; notes incr SOB but denies CP/  f/c/s, edema;   EXAM shows Afeb, VSS, O2sat=92% on 3L/min [place];  Wt=231# (up 15# & he was 204-210# prev);  HEENT- neg, mallampati3;  Chest- decr BS bilat, otherw clear w/o w/r/r;  Heart- RR w/o m/r/g;  Abd- obese, soft;  Ext- neg w/o c/c/e. IMP/PLAN>>  [name] describes an upper resp infection & COPD exac=> Rx w/ Levaquin, Pred52m-4d taper, + his NEBS w/ duoneb Qid & Budes Bid; he desperately needs to get the weight down...          Problem List:  Hx of SINUSITIS (ICD-473.9) - off Nasonex now & recent Rx w/ Augmentin/ Saline... notes occas sinus congestion, drainage, sl HA, etc... he knows that he needs to quit smoking! ~  10/10: Adm for epistaxis w/ eval by DrWolicki- CXR= NAD, labs OK, rec for saline lavage- nose bleed resolved & disch home (no recurrence so far). ~  1/16: he presented w/ acute max & frontal sinusitis> Rx w/ Augmentin, Align, Saline, Mucinex, etc... ~  3/17: he has acute bronchitic exac- treated w/ Levaquin x7d + his regular meds...  COPD (ICD-496) >>   ~  on ADVAIR 250Bid; STILL SMOKES CIGARETTES & he's refused Chantix...  ~  He had hemoptysis 6/99 without lesion on CXR, CTChest, or Bronchoscopy... ~  Adm 10/10 w/ epistaxsis & eval DrWolicki as noted> he wanted to do Sleep Study but pt cancelled it & declined to resched. ~  CXR 5/11 showed COPD/ bullous emphysema, NAD..[name] ~  CXR 5/12 showed ectatic calcif & tort Aorta, COPD w/ incr markings ar bases, NAD, +degen spondylosis. ~  CXR 6/13 showed COPD/ emphysema, scarring in the lower lobes, otherw clear lungs, DJD in spine... ~  CXR 7/14 showed COPD/ emphysema, incr markings and scarring at the bases, NAD. ~  1/15: EJaquelyn Bittersays he's decr his smoking from prev 3-4ppd down to 3 cig/d;  He remains on Advair250> states breathing is ok, at baseline CAT score=12; baseline cough, sput, no hemoptysis, stable DOE, no edema... ~  CXR 7/15 showed underlying COPD, mild bibasilar fibrosis, norm heart size, DJD in spine, NAD...  ~  PFT 7/15  showed> FVC=3.18 (78%), FEV1=1.22 (38%), %1sec=38; mid-flows=13% predicted... This is c/w GOLD Stage3 COPD and we discussed this> He is rec to quit all smoking immediately; he has been using the nicotine gum & rec to try patches, offered Wellbutrin, he refuses Chantix; needs to continue ADVAIR250Bid & samples givem; he declines to start SP[place]at this time due to cost & donut hole (we will address this again on return); reminded to use MUCINEX 6069m 1to2 Bid w/ fluids, and OK to use OTC DELSYM as needed for cough...   ~  1/16: he claims stable on Advair250 but still smoking & can't vs won't quit; we decided to add Spiriva daily via handihaler... ~  7/16: on Advair250Bid & Spiriva daily; symptoms as above, he is stoic, still smoking 1ppd & NOT  motivated to quit, asked to cut back & use both inhalers regularly;  We reviewed the low-dose screening CT Chest program for the early detection of lung cvancer- he will consider this & let me know his decision... ~  12/16:  He was [facility] for 9d w/ hypoxemic resp failure, COPD exac, and found to have pulmHTN w/ 2DEcho showing PAsys=51;  Treated w/ O2, Pred taper, antibiotics, NEBS, Advair/Spiriva, etc;  He says he has quit smoking... ~  [facility] 10/2015> CXR- COPD, bullous dis, incr markings at bases, NAD; Cultures of blood/ sput/ urine were all neg; Serologies for strep & legionella were neg; Viral panel was neg; He was treated w/ Oxygen, IV Solumedrol, Rocephin/ Zithromax, NEBS, Lovenox, flutter, etc; He was disch on Home O2- 2L/min activ & 1L/min rest, Pred taper, NEBS, Levaquin, Advair250/ Spiriva => improved. 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   HE HAS ESTABLISHED PRIMARY CARE w/  [name], [facility]>>   HYPERTENSION (ICD-401.9) - controlled on METOPROLOL 9mTid,  NORVASC 154md, LISINOPRIL 401m...   ~  11/11:  BP=122/70, not checking BP's at home... denies HA, fatigue, visual changes, CP, palipit, dizziness, syncope, dyspnea, edema, etc. ~  5/12:  BP= 120/72, pt very pleased w/ his wt loss & improved exercise ability. ~  6/13:  BP= 122/64 & feeling well w/o CP, palpit, SOB, edema, etc... ~  12/13:  BP=122/82 & he denies CP, palpit, SOB, edema... ~  7/14: on Metop25-3/d, Amlod10, Lisin40; BP=130/78 & he denies CP, palpit, SOB, edema ~  1/15: BP remains stable on Metop25-2AM & 1PM, Amlod5, Lisin40; BP= 120/72 and he denies CP, palpit, ch in SOB, edema. ~  2016> His meds have been adjusted- now on Rx w/ Amlod10, Lisin10; BP= 130/72 7 he denies CP, palpit, edema; chr SOB/DOE due to his COPD.  CORONARY ARTERY DISEASE (ICD-414.00) - ASA 73m13m& off Plavix as of 2/13 officially as he wasn't taking it regularly anyway. ~  Hx prev IWMI and PTCA/ stent in RCA by DrBrodie 12/95 & 5/96...  ~  Myoview 4/08 abnormal w/ inferior ischemia and subseq cath revealing restenoses in RCA- s/p repeat PTCA/ stents...  ~  f/u Myoview 9/08 w/ prev infer infarct, no ischemia, EF=56%... ~  9/11:  pre op cardiac eval DrBrodie> OK to hold ASA/ Plavix for bladder surg. 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 ~  2/13:  He had f/u [name] w/ Myoview showing a fixed defect in infer wall, no ischemia, EF=58%; pt wasn't taking Plavix, therefore stopped in favor of ASA73mg59m. ~  3/14:  He had f/u [name]> CAD, IWMI 1995, mult PCIs, last Myoview 2013 was low risk; too sedentary, no changes made; 73 y/13brother died recently w/ AAA rupture... ~  EKG 3/14 showed NSR, rate65, WNL, NAD... ~  He maintains f/u w/ CARDS, [name]- known CAD, Hx inferior MI 1995, s/p mult PCIs- most recent 2009; finally qit smoking  [facility] w/ hypoxemia; on ASA, statin, Amlod & Lisin.  HYPERCHOLESTEROLEMIA (Mixed Hyperlipidemia) - prev on Simva80 but DrBrodie decr him to SIMVASTATIN 20mg/82mFish Oil & Red  Wine qd... ~  [place] 9/08 on Simva40 showed TChol 151, TG 106, HDL 23, LDL 107 ~  FLP 8/09 on Simva80 showed TChol 142, TG 135, HDL 28, LDL 87 ~  FLP 6/10 on Simva80 showed TChol 151, TG 117, HDL 31, LDL 97 ~  FLP in hosp 10/10 showed TChol 128, TG 110, HDL 26, LDL 80 ~  FLP 5/11 on Simva80 showed TChol 144, TG 215, HDL 26, LDL 89 ~  9/11:  DrBrodie decr him to Simva20... we discussed the need for better diet & wt reduction. ~  FLP 5/12 on Simva20 showed TChol 162, TG 159, HDL 32, LDL 98 ~  FLP 6/13 on Simva20 showed TChol 130, TG 102, HDL 33, LDL 77... rec to incr exercise program & get wt down! ~  FLP 7/14 on Simva20 showed TChol 158, TG 145, HDL 33, LDL 96... He has gained way too much wt 7 must get on diet! ~  Followed by PCP- [name]...  DIABETES MELLITUS, BORDERLINE (ICD-790.29) - on diet alone... ~  labs 9/08 showed BS=100, HgA1c=6.1 ~  labs 8/09 showed BS= 107, A1c= 6.2 ~  labs 6/10 showed BS= 108, A1c= 6.2 ~  lab 5/11 (wt=244#) showed BS= 121. A1c= 6.7.[name]. he was told "get wt down, or start meds". ~  Labs 5/12 showed BS= 109, A1c= 6.1 ~  Labs 6/13 showed BS= 114, & he needs to lose the weight... ~  Labs 7/14 on diet alone showed BS= 120, A1c= 6.7 ~  Followed by PCP- [name]...  OBESITY (ICD-278.00) - weight was down as low as 202# in 2/09, and steadily incr to 244# by 5/11... ~  weight 5/11 = 244#.[name].  we reviewed diet + exercise necessary to lose weight... ~  weight 11/11 = 217# (after dx & rx for bladder cancer) ~  Weight 5/12 = 214# but says it's 192# at home... ~  Weight 6/13 = 211# but knows it's better since his pant/ belt is down to 36" (under his roll). ~  Weight 7/14 = 230# but he has quit smoking he says, now must lose the weight... ~  Weight 1/15 = 229# ~  Weight 1/17 =  216# ~  Weight 3/17 = 231# and we reviewed diet, exercise, wt reducing strategies...  GASTRITIS (ICD-535.50) - had EGD 10/05 showing gastritis... on RANITADINE 327m/d due to his Plavix Rx, off prev Omep.  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 ~  4/12:  Pt reported f/u cysto was neg, doing well... ~  10/12:  Note from [name] reviewed> cysto was neg, no recurrent tumors... ~  4/13:  Note from [name] reviewed> neg f/u cysto w/o recurrent TCCa... He wants to continue Q6mo36motoscopies... ~  1/14:  He had f/u w/ [name]> Hx TCCa bladder, s/p TURBT 9/11, given BCG 11/11-12/11 and f/u Cysto 1/12 was neg; repeat cysto 1/14 was neg as well...  DEGENERATIVE JOINT DISEASE (ICD-715.90) - He has c/o right shoulder pain, hip pain, knee pain, and LBP> Rx ADVIL w/ some benefit... offered Ortho referral & he will decide.  ANXIETY (ICD-300.00) - on ALPRAZOLAM 0.5mg 39m...   Past  Surgical History:  Procedure Laterality Date  . cataract surgery  septemeber 2013  . cataract surgery/sub posterior vitrectomy in left eye  1996   Dr. Zadie Rhine  . [facility]  [phone]    Outpatient Encounter Prescriptions as of 08/05/2016  Medication Sig  . albuterol (PROVENTIL HFA;VENTOLIN HFA) 108 (90 BASE) MCG/ACT inhaler Inhale 2 puffs into the lungs 4 (four) times daily as needed for wheezing (for use when not at home / unable to use nebulizer).  . Albuterol Sulfate (PROAIR RESPICLICK) 818 (90 Base) MCG/ACT AEPB Inhale 2 puffs into the lungs every 6 (six) hours as needed. (Patient taking differently: Inhale 2 puffs into the lungs every 6 (six) hours  as needed (shortness of breath). )  . ALPRAZolam (XANAX) 0.5 MG tablet Take 0.5 mg by mouth 3 (three) times daily as needed for anxiety or sleep.  [name] aspirin 81 MG tablet Take 81 mg by mouth at bedtime.   . budesonide (PULMICORT) 0.25 MG/2ML nebulizer solution Take 2 mLs (0.25 mg total) by nebulization 2 (two) times daily.  . Flaxseed, Linseed, (FLAXSEED OIL) 1000 MG CAPS Take 1 capsule by mouth daily.    . furosemide (LASIX) 40 MG tablet Take 40 mg by mouth 2 (two) times daily.  [name] ibuprofen (ADVIL,MOTRIN) 600 MG tablet Take 600 mg by mouth every 6 (six) hours as needed for moderate pain.  [name] ipratropium-albuterol (DUONEB) 0.5-2.5 (3) MG/3ML SOLN Take 3 mLs by nebulization 4 (four) times daily.  . isosorbide mononitrate (IMDUR) 30 MG 24 hr tablet Take 1 tablet (30 mg total) by mouth daily. (Patient taking differently: Take 30 mg by mouth 2 (two) times daily. )  . metoprolol succinate (TOPROL-XL) 25 MG 24 hr tablet TAKE 2 TABLETS BY MOUTH IN THE MORNING AND 1 TABLET BY MOUTH IN THE THE EVENING  . Multiple Vitamin (MULTIVITAMIN) capsule Take 1 capsule by mouth daily.    . nitroGLYCERIN (NITROSTAT) 0.4 MG SL tablet Place 1 tablet (0.4 mg total) under the tongue every 5 (five) minutes as needed for chest pain. (Patient not taking: Reported on 10/01/2016)  . Omega-3 Fatty Acids (FISH OIL) 1200 MG CAPS Take 1 capsule by mouth daily.    . simvastatin (ZOCOR) 20 MG tablet Take 1 tablet (20 mg total) by mouth daily at 6 PM.  . sodium chloride (OCEAN) 0.65 % SOLN nasal spray Place 1 spray into both nostrils as needed for congestion.  . [DISCONTINUED] lisinopril (PRINIVIL,ZESTRIL) 10 MG tablet Take 1 tablet (10 mg total) by mouth daily.  . [DISCONTINUED] albuterol (PROVENTIL) (2.5 MG/3ML) 0.083% nebulizer solution Take 3 mLs (2.5 mg total) by nebulization 3 (three) times daily. (Patient not taking: Reported on 08/05/2016)  . [DISCONTINUED] levofloxacin (LEVAQUIN) 500 MG tablet Take 1 tablet (500 mg total) by  mouth daily.  . [DISCONTINUED] predniSONE (DELTASONE) 20 MG tablet Take as directed   No facility-administered encounter medications on file as of 08/05/2016.     Allergies  Allergen Reactions  . Lisinopril Swelling    Current Medications, Allergies, Past Medical History, Past Surgical History, Family History, and Social History were reviewed in [employer] record.    Review of Systems        See HPI - all other systems neg except as noted... The patient complains of some dyspnea on exertion.  The patient denies anorexia, fever, weight loss, weight gain, vision loss, decreased hearing, hoarseness, chest pain, syncope, peripheral edema, prolonged cough, headaches, hemoptysis, abdominal pain, melena, hematochezia, severe indigestion/heartburn, hematuria, incontinence, muscle weakness,  suspicious skin lesions, transient blindness, difficulty walking, depression, unusual weight change, abnormal bleeding, enlarged lymph nodes, and angioedema.     Objective:   Physical Exam    WD, Obese, 70 y/o WM in NAD... Vital Signs:  Reviewed... GENERAL:  Alert & oriented; pleasant & cooperative... HEENT:  [place]/AT, EOM-wnl, PERRLA, EACs-clear, TMs-wnl, NOSE- sl congestion, THROAT-clear & wnl. NECK:  Supple w/ fairROM; no JVD; normal carotid impulses w/o bruits; no thyromegaly or nodules palpated; no lymphadenopathy. CHEST:  decr BS at bases bilat w/ few scat rhonchi, no wheezing/ rales/ or signs of consolidation... HEART:  Regular Rhythm; without murmurs/ rubs/ or gallops detected... ABDOMEN:  Obese, soft, & nontender; sm knot palpated in mid abd above umbilicus; normal bowel sounds; no organomegaly detected. EXT: without deformities, mild arthritic changes; no varicose veins/ +venous insuffic/ no edema. NEURO:  CN's intact; no focal neuro deficits... DERM:  No lesions noted; no rash etc...  RADIOLOGY DATA:  Reviewed in the EPIC EMR & discussed w/ the patient...  LABORATORY  DATA:  Reviewed in the EPIC EMR & discussed w/ the patient...   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  11/21/15>  referred for PulmRehab, contact [place] re- POC, he still needs Full PFTs & CT Chest... We plan ROV in 6 wks 02/02/16> We discussed continuing the NEBS Tid followed by Advair250Bid & Spiriva daily; add Mucinex 125mBid & we will Rx w/ Levaquin500/d x7d for his bronchitic infection (see AVS);  He still needs a CT Chest but wants to wait for now;  [facility] and he wants diff DME supplier- we will inquire, plan ROV 331mo/21/17>   The Ad[employer]here too $$ so we switched to NEBS w/ Duoneb Qid & Budes Bid; added ProairHFA prn; he desperately needs to lose wt in conjunction w/ his PulmRehab etc... 08/05/16>   He presents w/ a COPD exac-- Rx w/ Levaquin, Pred20-4d taper + NEBS w/ duoneb Qid & budes Bid...   Primary Care followed by [name]>> HBP>   CAD>            Followed by [name] & DrWilburt FinlayIPIDS>   DM>   GI> Hx Gastritis, Polyps>   GU>   Followed by [name]   Patient's Medications  New Prescriptions   LISINOPRIL (PRINIVIL,ZESTRIL) 10 MG TABLET    Take 1 tablet (10 mg total) by mouth daily.   PREDNISONE (DELTASONE) 10 MG TABLET    Take 1 tablet (10 mg total) by mouth daily with breakfast. Take 4073m4 tabs) x 3 days, then taper to 90m70m tabs) x 3 days, then 20mg25mtabs) x 3days, then 10mg 49mab) x 3days, then stop.  Previous Medications   ALBUTEROL (PROVENTIL HFA;VENTOLIN HFA) 108 (90 BASE) MCG/ACT INHALER    Inhale 2 puffs into the lungs 4 (four)  times daily as needed for wheezing (for use when not at home / unable to use nebulizer).   ALBUTEROL SULFATE (PROAIR RESPICLICK) 108 (9628ASE) MCG/ACT AEPB    Inhale 2 puffs into the lungs every 6 (six) hours  as needed.   ALPRAZOLAM (XANAX) 0.5 MG TABLET    Take 0.5 mg by mouth 3 (three) times daily as needed for anxiety or sleep.   ASPIRIN 81 MG TABLET    Take 81 mg by mouth at bedtime.    BUDESONIDE (PULMICORT) 0.25 MG/2ML NEBULIZER SOLUTION    Take 2 mLs (0.25 mg total) by nebulization 2 (two) times daily.   FLAXSEED, LINSEED, (FLAXSEED OIL) 1000 MG CAPS    Take 1 capsule by mouth daily.     FUROSEMIDE (LASIX) 40 MG TABLET    Take 40 mg by mouth 2 (two) times daily.   IBUPROFEN (ADVIL,MOTRIN) 600 MG TABLET    Take 600 mg by mouth every 6 (six) hours as needed for moderate pain.   IPRATROPIUM-ALBUTEROL (DUONEB) 0.5-2.5 (3) MG/3ML SOLN    Take 3 mLs by nebulization 4 (four) times daily.   ISOSORBIDE MONONITRATE (IMDUR) 30 MG 24 HR TABLET    Take 1 tablet (30 mg total) by mouth daily.   METOPROLOL SUCCINATE (TOPROL-XL) 25 MG 24 HR TABLET    TAKE 2 TABLETS BY MOUTH IN THE MORNING AND 1 TABLET BY MOUTH IN THE THE EVENING   MULTIPLE VITAMIN (MULTIVITAMIN) CAPSULE    Take 1 capsule by mouth daily.     NITROGLYCERIN (NITROSTAT) 0.4 MG SL TABLET    Place 1 tablet (0.4 mg total) under the tongue every 5 (five) minutes as needed for chest pain.   OMEGA-3 FATTY ACIDS (FISH OIL) 1200 MG CAPS    Take 1 capsule by mouth daily.     SIMVASTATIN (ZOCOR) 20 MG TABLET    Take 1 tablet (20 mg total) by mouth daily at 6 PM.   SODIUM CHLORIDE (OCEAN) 0.65 % SOLN NASAL SPRAY    Place 1 spray into both nostrils as needed for congestion.  Modified Medications   No medications on file  Discontinued Medications   ALBUTEROL (PROVENTIL) (2.5 MG/3ML) 0.083% NEBULIZER SOLUTION    Take 3 mLs (2.5 mg total) by nebulization 3 (three) times daily.   LEVOFLOXACIN (LEVAQUIN) 500 MG TABLET    Take 1 tablet (500 mg total) by mouth  daily.   LISINOPRIL (PRINIVIL,ZESTRIL) 10 MG TABLET    Take 1 tablet (10 mg total) by mouth daily.   PREDNISONE (DELTASONE) 20 MG TABLET    Take as directed  ADDENDUM>> Advair & Spiriva discontinued due to cost & replaced by DUONEB-Qid & PULMICORT 0,57mBid via NEBULIZER..Marland KitchenMarland Kitchen

## 2016-10-13 NOTE — Patient Instructions (Signed)
Today we updated your med list in our EPIC system...    Continue your current medications the same...  Continue using your NEBULIZER regularly as we discussed...  We will arrange for a SLEEP STUDY- requesting a SPLIT-NIGHT protocol to expedite your BiPAP approval...  Call for any questions...  Let's plan a follow up visit in 6-8 weeks, sooner if needed for problems.Marland KitchenMarland Kitchen

## 2016-10-15 ENCOUNTER — Ambulatory Visit (HOSPITAL_BASED_OUTPATIENT_CLINIC_OR_DEPARTMENT_OTHER): Payer: Medicare Other | Attending: Pulmonary Disease | Admitting: Internal Medicine

## 2016-10-15 VITALS — Ht 66.0 in | Wt 232.0 lb

## 2016-10-15 DIAGNOSIS — R0683 Snoring: Secondary | ICD-10-CM | POA: Insufficient documentation

## 2016-10-15 DIAGNOSIS — R5383 Other fatigue: Secondary | ICD-10-CM | POA: Diagnosis not present

## 2016-10-15 DIAGNOSIS — G4734 Idiopathic sleep related nonobstructive alveolar hypoventilation: Secondary | ICD-10-CM

## 2016-10-15 DIAGNOSIS — G4733 Obstructive sleep apnea (adult) (pediatric): Secondary | ICD-10-CM | POA: Diagnosis not present

## 2016-10-19 ENCOUNTER — Other Ambulatory Visit: Payer: Self-pay

## 2016-10-19 NOTE — Patient Outreach (Signed)
Transition of care note: Placed call to patient who identified himself. Reports that he is doing well. Reports slight shortness of breath today after being active. States BIPAP continues to work well. Denies any swelling.  Patient reports that he is waiting for his sleep study results. States that he has follow up planned with Dr. Cindee Salt in 6 weeks.   PLAN: Offered home visit and patient has accepted for 10/22/2016/ Confirmed address.  Tomasa Rand, RN, BSN, CEN [facility] [employer] [phone]

## 2016-10-22 ENCOUNTER — Other Ambulatory Visit: Payer: Self-pay

## 2016-10-22 NOTE — Patient Outreach (Signed)
[facility]) Care Management   10/22/2016  [name] [birth year] [id number]  [name] is an 70 y.o. male Arrived for home visit. Wife [name] present and engaged during home visit. Subjective: Patient reports that he has been doing very well since he was discharged. Reports Bipap has help him a lot and he is now sleeping well at night.  Reports he is waiting to get results of sleep study.  States that he takes all his medications as prescribed without difficulty. Patient reports that he is easily winded.  Reports wearing oxygen 24/7 on 4 liters. Reports using nebs without difficulty.   Objective:  Awake and alert.  No distress.  Home neat and clean. Respirations even and unlabored. Talking in complete sentences. Vitals:   10/22/16 1027  BP: 118/62  Pulse: 70  Resp: 20  SpO2: 97%  Weight: 240 lb (108.9 kg)  Height: 1.676 m (5\' 6" )   Review of Systems  Constitutional: Negative.   HENT: Positive for hearing loss.   Eyes: Negative.   Respiratory: Positive for cough and shortness of breath.   Cardiovascular: Negative.   Gastrointestinal: Negative.   Genitourinary: Positive for frequency.  Musculoskeletal: Positive for joint pain.       Shoulder pains   Skin: Negative.   Neurological: Negative.   Endo/Heme/Allergies: Negative.   Psychiatric/Behavioral: Negative.     Physical Exam  Constitutional: He is oriented to person, place, and time. He appears well-developed and well-nourished.  Cardiovascular: Normal rate, normal heart sounds and intact distal pulses.   Respiratory: Effort normal and breath sounds normal.  GI: Soft. Bowel sounds are normal.  Musculoskeletal: Normal range of motion. He exhibits edema.  1 plus edema  Neurological: He is alert and oriented to person, place, and time.  Skin: Skin is warm and dry.  Psychiatric: He has a normal mood and affect. His behavior is normal. Judgment and thought content normal.    Encounter Medications:    Outpatient Encounter Prescriptions as of 10/22/2016  Medication Sig  . albuterol (PROVENTIL HFA;VENTOLIN HFA) 108 (90 BASE) MCG/ACT inhaler Inhale 2 puffs into the lungs 4 (four) times daily as needed for wheezing (for use when not at home / unable to use nebulizer).  . Albuterol Sulfate (PROAIR RESPICLICK) 108 (90 Base) MCG/ACT AEPB Inhale 2 puffs into the lungs every 6 (six) hours as needed. (Patient taking differently: Inhale 2 puffs into the lungs every 6 (six) hours as needed (shortness of breath). )  . ALPRAZolam (XANAX) 0.5 MG tablet Take 0.5 mg by mouth 3 (three) times daily as needed for anxiety or sleep.  14/06/2016 aspirin 81 MG tablet Take 81 mg by mouth at bedtime.   . budesonide (PULMICORT) 0.25 MG/2ML nebulizer solution Take 2 mLs (0.25 mg total) by nebulization 2 (two) times daily.  . Flaxseed, Linseed, (FLAXSEED OIL) 1000 MG CAPS Take 1 capsule by mouth daily.    . furosemide (LASIX) 40 MG tablet Take 40 mg by mouth 2 (two) times daily.  [name] ibuprofen (ADVIL,MOTRIN) 600 MG tablet Take 600 mg by mouth every 6 (six) hours as needed for moderate pain.  [name] ipratropium-albuterol (DUONEB) 0.5-2.5 (3) MG/3ML SOLN Take 3 mLs by nebulization 4 (four) times daily.  . isosorbide mononitrate (IMDUR) 30 MG 24 hr tablet Take 1 tablet (30 mg total) by mouth daily. (Patient taking differently: Take 30 mg by mouth 2 (two) times daily. )  . metoprolol succinate (TOPROL-XL) 25 MG 24 hr tablet TAKE 2 TABLETS BY MOUTH  IN THE MORNING AND 1 TABLET BY MOUTH IN THE THE EVENING  . Multiple Vitamin (MULTIVITAMIN) capsule Take 1 capsule by mouth daily.    . nitroGLYCERIN (NITROSTAT) 0.4 MG SL tablet Place 1 tablet (0.4 mg total) under the tongue every 5 (five) minutes as needed for chest pain.  . Omega-3 Fatty Acids (FISH OIL) 1200 MG CAPS Take 1 capsule by mouth daily.    . simvastatin (ZOCOR) 20 MG tablet Take 1 tablet (20 mg total) by mouth daily at 6 PM.  . sodium chloride (OCEAN) 0.65 % SOLN nasal spray Place 1  spray into both nostrils as needed for congestion.   No facility-administered encounter medications on file as of 10/22/2016.     Functional Status:   In your present state of health, do you have any difficulty performing the following activities: 10/22/2016 09/24/2016  Hearing? Y N  Vision? N N  Difficulty concentrating or making decisions? N N  Walking or climbing stairs? Y N  Dressing or bathing? N N  Doing errands, shopping? N N  Preparing Food and eating ? N -  Using the Toilet? N -  In the past six months, have you accidently leaked urine? N -  Do you have problems with loss of bowel control? N -  Managing your Medications? N -  Managing your Finances? N -  Housekeeping or managing your Housekeeping? N -  Some recent data might be hidden    Fall/Depression Screening:    PHQ 2/9 Scores 10/22/2016 08/10/2016 07/05/2016 05/24/2016 04/09/2016 03/11/2016 02/20/2016  PHQ - 2 Score 0 _0 PHQ- 9 Score - 8 8 - - 11 11   Fall Risk  10/22/2016 08/10/2016 07/05/2016 05/24/2016 04/09/2016  Falls in the past year? No No No No -  Risk for fall due to : - Impaired balance/gait Impaired balance/gait Impaired balance/gait Impaired balance/gait   Assessment:   (1) Reviewed [facility] consent and no changes requested by patient. Provided my contact information and new patient packet. (2) trace edema noted. Following low salt diet. (3) pending sleep study results. (4) COPD imporved per patient report.  Plan:  (1) confirmed consent in EPIC. (2) encouraged patient to continue to follow low salt diet.   (3) encouraged patient to call MD for results. (4) provided [facility] COPD packet and reviewed COPD zones.   Care planning and goal setting during home visit and patients primary goal continues to be to avoid readmission. Will continue weekly outreach attempts.   This visit sent to primary MD.  [facility] CM Care Plan Problem One   Flowsheet Row Most Recent Value  Care Plan Problem One  Recent hospital  admission for shortness of breath.  Role Documenting the Problem One  Care Management [place] for Problem One  Active  [facility] Long Term Goal (31-90 days)  Patient will report no readmission for shortness of breath in the next 31 days.   [facility] Long Term Goal Start Date  10/01/16  Interventions for Problem One Long Term Goal  Home visit completed and reviewed importance of early recognition of symptoms and when to call MD.  [facility] CM Short Term Goal #1 (0-30 days)  Patient will see pulmonary MD in the next 2 weeks   [facility] CM Short Term Goal #1 Start Date  10/01/16  [facility] CM Short Term Goal #1 Met Date  10/22/16  Interventions for Short Term Goal #1  Reviewed EPIC pending appointment with patient who was unaware  of appointment.   [facility] CM Short Term Goal #2 (0-30 days)  Patient will report taking all medications as prescribed in the next 30 days.  [facility] CM Short Term Goal #2 Start Date  10/01/16  Interventions for Short Term Goal #2  Reinforced need to take all medications as prescribed.      Tomasa Rand, RN, BSN, CEN [facility] [employer] [phone]

## 2016-10-23 DIAGNOSIS — G4733 Obstructive sleep apnea (adult) (pediatric): Secondary | ICD-10-CM

## 2016-10-23 NOTE — Procedures (Signed)
  Patient Name: [name], [name] Date: 10/15/2016 Gender: Male D.O.B: 1945-12-28 Age (years): 66 Referring Provider: Teressa Lower Height (inches): 4 Interpreting Physician: Baird Lyons MD, ABSM Weight (lbs): 232 RPSGT: Baxter Flattery BMI: 37 MRN: [id number] Neck Size: 17.00 CLINICAL INFORMATION Sleep Study Type: NPSG   Indication for sleep study: Fatigue, OSA, Snoring, Witnessed Apneas   Epworth Sleepiness Score:  8/24   SLEEP STUDY TECHNIQUE As per the AASM Manual for the Scoring of Sleep and Associated Events v2.3 (April 2016) with a hypopnea requiring 4% desaturations. The channels recorded and monitored were frontal, central and occipital EEG, electrooculogram (EOG), submentalis EMG (chin), nasal and oral airflow, thoracic and abdominal wall motion, anterior tibialis EMG, snore microphone, electrocardiogram, and pulse oximetry.  MEDICATIONS Medications self-administered by patient taken the night of the study : none reported  SLEEP ARCHITECTURE The study was initiated at 10:19:08 PM and ended at 5:00:51 AM. Sleep onset time was 7.9 minutes and the sleep efficiency was 64.8%. The total sleep time was 260.3 minutes. Stage REM latency was 205.5 minutes. The patient spent 7.11% of the night in stage N1 sleep, 85.02% in stage N2 sleep, 0.00% in stage N3 and 7.88% in REM. Alpha intrusion was absent. Supine sleep was 0.00%.  RESPIRATORY PARAMETERS The overall apnea/hypopnea index (AHI) was 1.8 per hour. There were 0 total apneas, including 0 obstructive, 0 central and 0 mixed apneas. There were 8 hypopneas and 4 RERAs. The AHI during Stage REM sleep was 17.6 per hour. AHI while supine was N/A per hour. The mean oxygen saturation was 94.61%. The minimum SpO2 during sleep was 88.00%. Moderate snoring was noted during this study.  CARDIAC DATA The 2 lead EKG demonstrated sinus rhythm. The mean heart rate was 74.30 beats per minute. Other EKG findings include: None.  LEG  MOVEMENT DATA The total PLMS were 161 with a resulting PLMS index of 37.11. Associated arousal with leg movement index was 0.2 .  IMPRESSIONS - No significant obstructive sleep apnea occurred during this study (AHI = 1.8/h). - No significant central sleep apnea occurred during this study (CAI = 0.0/h). - The patient had minimal or no oxygen desaturation during the study (Min O2 = 88.00%). The patient was studied on his continuous home O2 setting  4 L/min.  Mean saturation 94.6%. - The patient snored with Moderate snoring volume. - No cardiac abnormalities were noted during this study. - Moderate periodic limb movements of sleep occurred during the study. No significant associated arousals.  DIAGNOSIS - Nocturnal Hypoxemia (327.26 [G47.36 ICD-10])  RECOMMENDATIONS - Avoid alcohol, sedatives and other CNS depressants that may worsen sleep apnea and disrupt normal sleep architecture. - Sleep hygiene should be reviewed to assess factors that may improve sleep quality. - Weight management and regular exercise should be initiated or continued if appropriate.  [Electronically signed] 10/23/2016 10:19 AM  Baird Lyons MD, ABSM Diplomate, American Board of Sleep Medicine   NPI: [id number]  [place], American Board of Sleep Medicine  ELECTRONICALLY SIGNED ON:  10/23/2016, 10:15 AM [place] PH: [phone]   FX: [phone] Lyons

## 2016-10-27 ENCOUNTER — Other Ambulatory Visit: Payer: Self-pay | Admitting: Pulmonary Disease

## 2016-10-27 DIAGNOSIS — J9611 Chronic respiratory failure with hypoxia: Secondary | ICD-10-CM

## 2016-10-28 DIAGNOSIS — I10 Essential (primary) hypertension: Secondary | ICD-10-CM | POA: Diagnosis not present

## 2016-10-28 DIAGNOSIS — F418 Other specified anxiety disorders: Secondary | ICD-10-CM | POA: Diagnosis not present

## 2016-10-28 DIAGNOSIS — I25118 Atherosclerotic heart disease of native coronary artery with other forms of angina pectoris: Secondary | ICD-10-CM | POA: Diagnosis not present

## 2016-10-28 DIAGNOSIS — Z6838 Body mass index (BMI) 38.0-38.9, adult: Secondary | ICD-10-CM | POA: Diagnosis not present

## 2016-10-28 DIAGNOSIS — J441 Chronic obstructive pulmonary disease with (acute) exacerbation: Secondary | ICD-10-CM | POA: Diagnosis not present

## 2016-10-28 DIAGNOSIS — J449 Chronic obstructive pulmonary disease, unspecified: Secondary | ICD-10-CM | POA: Diagnosis not present

## 2016-10-28 DIAGNOSIS — J9601 Acute respiratory failure with hypoxia: Secondary | ICD-10-CM | POA: Diagnosis not present

## 2016-10-28 DIAGNOSIS — I2510 Atherosclerotic heart disease of native coronary artery without angina pectoris: Secondary | ICD-10-CM | POA: Diagnosis not present

## 2016-10-28 DIAGNOSIS — I472 Ventricular tachycardia: Secondary | ICD-10-CM | POA: Diagnosis not present

## 2016-10-29 ENCOUNTER — Other Ambulatory Visit: Payer: Self-pay

## 2016-10-29 NOTE — Patient Outreach (Signed)
Transition of care call: Placed call to patient who identified himself.  Reports that he is doing well. States that he is having his normal shortness of breath with activity.  Reports that Dr. Lenna Gilford called him and is working on CPAP/ BIPAP approval.  Patient report that the BIPAP is making a big difference for him and is helping him with his COPD.  Denies any new problems or concerns.  PLAN: Will continue to outreach weekly for transition of care call. Patient to call me for concerns sooner if needed.  Tomasa Rand, RN, BSN, CEN [facility] [employer] [phone]

## 2016-11-02 ENCOUNTER — Telehealth: Payer: Self-pay | Admitting: Pulmonary Disease

## 2016-11-02 DIAGNOSIS — J9621 Acute and chronic respiratory failure with hypoxia: Secondary | ICD-10-CM

## 2016-11-02 DIAGNOSIS — J9611 Chronic respiratory failure with hypoxia: Secondary | ICD-10-CM

## 2016-11-02 DIAGNOSIS — J9622 Acute and chronic respiratory failure with hypercapnia: Principal | ICD-10-CM

## 2016-11-02 NOTE — Telephone Encounter (Signed)
Called and spoke to [place] with APS. Pt does not qualify for CPAP because he does not have sleep apena but may qualify for Bipap based on his recent ABG. Maudie Mercury states the pt will need an ONO on 4lpm to help get the Bipap approved by insurance.   Dr. Lenna Gilford please advise if ok to order ONO on 4lpm. Thanks.

## 2016-11-02 NOTE — Telephone Encounter (Signed)
Per SN---  Ok please order the ONO on 4 liters oxygen.  Pt is already on BIPAP at home and needs this done so the insurance will cover the bipap.

## 2016-11-04 ENCOUNTER — Ambulatory Visit: Payer: Medicare Other | Admitting: Pulmonary Disease

## 2016-11-04 ENCOUNTER — Other Ambulatory Visit: Payer: Self-pay

## 2016-11-04 NOTE — Patient Outreach (Signed)
Telephone call to patient: Placed follow up call to patient about progress of approval for BIPAP machine. Patient reports that MD office is working on it. Patient reports that his breathing is better as long as he has the a machine at night. Reports no new problems or concerns.  PLAN: reviewed with patient that he has completed transition of care program successfully.  Reviewed current needs and patient wishes to have a phone call in 2-3 weeks to make sure he can get approval for his BIPAP machine.   Will plan to call patient back in 2-3 weeks.  Transition of care program completed.  Tomasa Rand, RN, BSN, CEN [facility] [employer] [phone]

## 2016-11-09 ENCOUNTER — Encounter: Payer: Self-pay | Admitting: Pulmonary Disease

## 2016-11-09 DIAGNOSIS — J9611 Chronic respiratory failure with hypoxia: Secondary | ICD-10-CM | POA: Diagnosis not present

## 2016-11-09 DIAGNOSIS — J9622 Acute and chronic respiratory failure with hypercapnia: Secondary | ICD-10-CM | POA: Diagnosis not present

## 2016-11-09 NOTE — Progress Notes (Signed)
Subjective:    Patient ID: [name], male    DOB: 1946-07-19, 70 y.o.   MRN: [id number]  HPI 70 y/o WM here for a follow up visit and on-going management of mult med problems...  ~  SEE PREV EPIC NOTES FOR OLDER DATA >>    CXR 7/14 showed COPD/ emphysema, incr markings and scarring at the bases, NAD...  LABS 7/14:  FLP- ok on Simva20;  Chems- ok x BS=120 A1c=6.7;  CBC- wnl;  TSH=1.26;  PSA=0.41...  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   PFT 7/15 showed> FVC=3.18 (78%), FEV1=1.22 (38%), %1sec=38; mid-flows=13% predicted... This is c/w GOLD Stage3 COPD and we discussed this...  ~  December 05, 2014:  43moROV & pulmonary recheck; [name], perhaps decr [name] to 1/2ppd but unable to quit & he has gained 6# in the interim up to 221# today;  His CC is sinusitis w/ nasal congestion, drainage & green mucus plus maxillary/ frontal HAs; he denies f/c/s, notes stable cough & chest symptoms w/ DOE;  He has been taking his Advair250Bid but prev refused addition of an anticholinergic due to "the donut hole";  States his breathing is "fine, except in cold air" ( we discussed wearing a scarf in winter etc)... He has GOLD Stage3 COPD w/ FEV1=1.22 (38%predicted) done 7/15;;  Exam shows decr BS bilat w/ few scat rhonchi & mild exp wheezing esp w/ forced maneuver...     We reviewed prob list, meds, xrays and labs> see below for updates >> he is followed by [name] and sees him once per year... PLAN>> we discussed treating his acute max sinusitis w/ Augmentin875Bid, plus align daily & nasal saline as needed; he knows the importance of [name] cessation but is not motivated & declines [name] cessation help; he will continue the Advair250Bid 7 we are adding spiriva via handihaler once per day; we plan ROV in 627mo/ f/u CXR & PFT,  call in the interim for any breathing issues....   ~  June 05, 2015:  75m21moV & [name] a good interval, no new complaints or concerns; still [name] +/-1ppd and blames family stress; notes cough, [name], no hemoptysis, SOB w/o change/stable- still mows 3 yards, does chores, tends to 20+chickens & their eggs, etc; he has repeatedly declined [name] cessation help, clearly not motivated to quit, not even doing his inhalers regularly!!!    COPD, +smoker> on Advair250Bid & Spiriva daily; symptoms as above, he is stoic, still [name] 1ppd & NOT motivated to quit, asked to cut back & use both inhalers regularly...    HBP> on Metop25Bid, Amlod10, Lisin40; BP=114/64 & he denies CP, palpit, SOB, edema...    CAD> on ASA81; followed by [name] (overdue for f/u)> CAD, IWMI 1995, mult PCIs, last Myoview 2013 was low risk; too sedentary, no changes made; 73 48o brother died w/ AAA rupture...    Chol> on Simva20; FLP 7/14 showed TChol 158, TG 145, HDL 33, LDL 96; needs better diet & wt reduction and ret for Fasting blood work overdue.    DM> on diet alone; LABS 7/14 showed BS= 120, A1c= 6.7; we reviewed low carb diet, exercise program, and wt reduction...    Obese> wt recorded as 204# today but it doesn't look like he's lost 26#; we reviewed diet &  exercise...    GI- Gastritis, polyps> prev on Zantac; prev colon 2004 w/ hyperplastic polyp removed & f/u 8/14 by [name] w/ 3 polyps removed (5-60m size, all were tubular adenomas), +divertics in sigmoid, f/u planned 5 yrs... NOTE> exam showed a small palp knot in mid-abd above the umbilicus ?etiology (I offered CT Abd but he declines & wants to discuss w/ his primary- [name]).    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    DJD> on OTC analgesics  as needed...    Anxiety> on Alpraz0.18m57mrn... We reviewed prob list, meds, xrays and labs> see below for updates >>  NOTE> HE IS FOLLOWED BY [name] for GEN MED/ PCP now...  CXR 06/05/15 showed stable heart size, Ao atherosclerosis & tortuousiy, COPD/emphysema, incr markings at the bases, arthritis in Tspine...  LABS 7/16: pt asked to ret for fasting blood work... IMP/PLAN>>  [name] he is stable but continues to smoke 1ppd; CXR w/ COPD/emphysema, atherosclerotic Ao, chronic changes but NAD- we discussed the low-dose screening CT Chest for the early detection of lung cancer (he is a candidate & he will consider participating in this program); in the meanwhile- continue Advair/ Spiriva regularly;  He is overdue for Cards f/u w/ [name], and needs to continue Q6mo29mo w/ [name] (w/ copy of notes to us);[place] will ret for FASTING blood work.  ADDENDUM>> given his age and [name] history, [name] candidate for LUNG CANCER SCREENING w/ LOW-DOSE CT Chest>>  This office visit constitutes a face-to-face visit for the purpose of lung cancer screening counseling and a shared decision making visit...  Eligibility for LDCT screening:  1) Age=70 (must be 55-77y/o);  2) Absence of symptoms of lung cancer: he denies CP, ch in SOB, hemoptysis.  [name] History:  1) He has 60+ pk-yrs (must have >30 pk-yr hx [name]);  2) [name] status- still [name] ~1ppd (current or former smoker who quit<15 yrs ago).  Counseling:  Importance of [name] cessation &/or continued abstinence discussed ; offered [name] cessation interventions- pt declined; pt agreed to continued LDCT screening annually.  Shared decision making:  We reviewed the potential benefits and harms of screening> eg. Early detection of lung cancer and improved survival, the need for additional diagnostic tests & possible surgery, the risk of over-diagnosis/ false positives/ and radiation exposure... All questions were answered to the best  of my ability & he would like to consider the LDCT screening & he will let us kKoreaw his decision=> he ignored this discussion & never called.  ~  October 31, 2015:  5 month ROV & post hospital follow up visit>  [name] was Adm 12/5 - 10/29/15 by [facility] w/ acute hypoxemic resp failure precipitated by a COPD exac; for his part he noted incr SOB, cough, green [name], & "slowing down", he denied f/c/s, no CP, etc;  In the ER he was hypoxemic w/ O2sats in the 70s on RA and eval revealed> CXR- COPD, bullous dis, incr markings at bases, NAD; Cultures of blood/ [name]/ urine were all neg; Serologies for strep & legionella were neg; Viral panel was neg;  LABs were OK (WBC=7.9=>14, Hg=15.3=>13.9, BS=100-140, BNP=498);  EKG showed NSR, rate78, NSSTTWA;  2DEcho 10/21/15 showed mildLVH, norm LVF w/ EF=55-60%, no regional wall motion abn, LA-mod  dil, RA- mod to severe dil, RV- mod dil, mod TR, PAsys-70mHg... He was treated w/ Oxygen, IV Solumedrol, Rocephin/ Zithromax, NEBS, Lovenox, flutter, etc; he was disch on Home O2- 2L/min activ & 1L/min rest, Pred taper, Levaquin, Advair250/ Spiriva...  He returns today feeling better, notes his home pulse-ox checks are improving & he has less SOB/DOE, less cough/[name], etc;  He says he has not smoked since PTA...      EXAM shows Afeb, VSS, O2sat=94% on 2L/min [place];  Wt=210# (he was 204 prev);  HEENT- neg, mallampati1;  Chest- decr BS bilat, few scat rhonchi, no w/r/consolidation;  Heart- RR w/o m/r/g;  Abd- obese, soft;  Ext- neg w/o c/c/e...  IMP/PLAN>>  COPD/emphysema, GOLD Stage 3, chronic hypoxemic resp failure- on Home O2 now, exsmoker- quit 10/2015, pulmonary HTN (PAsys by EClarene Duke- WHO group 3>  I congratulated him on not [name] & we discussed continuing the Oxygen regularly ([facility], Prednisone taper, Advair, Spiriva, NEBS w/ Albut, Mucinex/flutter;  He will follow up in 3 wks when the Pred has been tapered & we briefly discussed the need for Full PFTs and CT Chest later...    ~  November 21, 2015:  3wk ROV & [name] reports that he feels much better, SOB improved, cough improved, still prod of a Andrew amt green [name] w/o blood w/o f/c/s;  He's been using the NEB w/ Albut TID followed by his AdvairBid & SpirivaQd; he is off the Pred now, he is too sedentary & not exercising => he needs PULM REHAB & we will refer, [facility] we will inquire as to what he might be elig for...       EXAM shows Afeb, VSS, O2sat=98% on 2L/min [place];  Wt=216# (he was 204-210# prev);  HEENT- neg, mallampati2;  Chest- decr BS bilat, otherw clear w/o w/r/r;  Heart- RR w/o m/r/g;  Abd- obese, soft;  Ext- neg w/o c/c/e...  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 We plan ROV in 6 wks.  ~  February 03, 2016:  2-376moOV & Andrew Crateeturns w/ his severe obstructive lung dis, GOLD Stage 3, chr hypoxemic resp failure w/ pulmHTN (WHO group3); he quit [name] 10/2015 & is using home O2 3L/min, NEBS w/ Albut tid, Advair250Bid, Spiriva daily, Mucinex etc... He reports that breathing is stable overall but he is c/o 51m86mo URI w/ cough, thick yellow [name] hard to produce, incr SOB 7 chest tightness but no fever, no CP=> he wants another antibiotic... He spent much time ventilating about his problems w/ DME-[facility], his O2 set-up, & [facility] program- they still haven't contacted him about starting Rehab... He does have mult entries into EPIC from [employer] reviewed...     EXAM shows Afeb, VSS, O2sat=92% on 3L/min [place];  Wt=231# (up 15# & he was 204-210# prev);  HEENT- neg, mallampati2;  Chest- decr BS bilat, otherw clear w/o w/r/r;  Heart- RR w/o m/r/g;  Abd- obese, soft;  Ext- neg w/o c/c/e.  PFT 02/03/16>  FVC=2.57 (68%), FEV1=1.05 (38%), %1sec=41, mid-flows reduced at 18% predicted; post bronchodil  FEV1 w/o change;  TLC=6.67 (107%), RV=3.97 (176%), RV/TLC=60;  DLCO=22% and DL/VA=29%... This is c/w a severe obstructive ventilatory defect w/ air trapping and severely decr DLCO c/w emphysema- GOLD Stage  3 COPD... IMP/PLAN>>  We discussed continuing the NEBS Tid followed by Advair250Bid & Spiriva daily; add Mucinex 1279mBid & we will Rx w/ Levaquin500/d x7d for his bronchitic infection (see AVS);  He still needs a CT Chest but wants to wait for now;  [facility], and he wants diff DME supplier- we will inquire, plan ROV 378mo. ADDENDUM 03/18/16>> Pt unable to afford inhalers- ADVAIR & SPIRIVA discontinued and placed on NEBULIZER w/ DUONEB QID & PULMICORT 0.25 BID regularly (we will update med list to reflect this change).  ~  May 05, 2016:  54m78moV & [name] notes DOE w/ exertion- he is in PulHealth Netusing up to 6L/min w/ exercise (he tried Oxy[employer] 1 Automotiveusing 3-4L/min at rest & Qhs; he feels that he is making some progress & I reminded him the DIET + EXERCISE and wt loss would be very beneficial to his breathing; states he is walking & mowing on his off days (ie- when not in rehab); despite everything his wt continues to climb- now up 4# to 235# "exercise makes me hungry"... we reviewed the following medical problems during today's office visit >> his new PCP is [name]...    COPD, ex-smoker, chr hypoxemic RF> on NEB w/ Duoneb Qid + Budes-Bid + HFA prn (Advair & Spiriva too $$); he finally quit [name], symptoms as above, he is now in pulm rehab but having issues w/ low sats...    HBP> on MetopER-78m51mM&1PM), Amlod10, Lisin10, Lasix40Bid; BP=136/66 & he denies CP, palpit, SOB, edema...    CAD> on ASA81, Imdur30; followed by [name] & seen 04/2016> CAD, IWMI 1995, mult PCIs, last Myoview 2013 was low risk; too sedentary, edema decr w/ Lasix; 73 y39 brother died w/ AAA rupture...    Chol> on Simva20; FLP 12/16 showed TChol 138, TG 75, HDL 25, LDL 98; needs better diet & wt  reduction and better compliance.    DM> on diet alone; LABS 5/17 showed BS= 118, A1c= per PCP; we reviewed low carb diet, exercise program, and wt reduction...    Obese> wt = 235#, 5'6"Tall, BMI=38;  we reviewed diet & exercise...    GI- Gastritis, polyps> prev on Zantac; prev colon 2004 w/ hyperplastic polyp removed & f/u 8/14 by [name] w/ 3 polyps removed (5-7mm 39me, all were tubular adenomas), +divertics in sigmoid, f/u planned 5 yrs... NOTE> exam showed a small palp knot in mid-abd above the umbilicus ?etiology (I offered CT Abd but he declines & wants to discuss w/ his primary- [name]).    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     DJD> on OTC analgesics as needed...    Anxiety> on Alpraz0.5mg pr7m. EXAM shows Afeb, VSS, O2sat=92% on 3L/min [place];  Wt=231# (up 15# & he was 204-210# prev);  HEENT- neg, mallampati3;  Chest- decr BS bilat, otherw clear w/o w/r/r;  Heart- RR w/o m/r/g;  Abd- obese, soft;  Ext- neg w/o c/c/e. IMP/PLAN>>  [name] is rec to continue current meds regularly, get on diet & get wt down , continue in PulmRehab, etc; we wrote for a ProairHFA rescue inhaler for prn use...   ~  August 05, 2016:  54mo ROV59moast visit we rec continuing same meds but stressed DIET, EXERCISE, wt reduction-- unfortunately he  has GAINED 8# up to 243# (BMI=39) in the interval;  He states that "I don't get hungry, I only eat one sandwich & sometimes a salad at night;  He says "they kicked me out" of the [facility] program due to low oxygens even w/ use of the Oxymizer;  He says he is exercising at home now;  He has established PCP w/ [name] & apparently he did labs which pt reports were "OK".    [name] c/o cough, chest congestion, yellow [name], ans a "sinus infection" w/ yellow drainage; notes incr SOB but denies CP/  f/c/s, edema;   EXAM shows Afeb, VSS, O2sat=92% on 3L/min [place];  Wt=231# (up 15# & he was 204-210# prev);  HEENT- neg, mallampati3;  Chest- decr BS bilat, otherw clear w/o w/r/r;  Heart- RR w/o m/r/g;  Abd- obese, soft;  Ext- neg w/o c/c/e. IMP/PLAN>>  [place] describes an upper resp infection & COPD exac=> Rx w/ Levaquin, Pred77m-4d taper, + his NEBS w/ duoneb Qid & Budes Bid; he desperately needs to get the weight down...  ~  October 13, 2016:  ETenochreturns and says "I've been worse" & indicates that he really likes the BiPAP he was placed on & disch with after his last Hosp 11/10 - 09/29/16; records reviewed by me- Adm w/ cough, yellow [name], chills and incr SOB; CXR revealed COPD, incr markings & bibasilar atx; [name]/ blood cultures and resp viral panel were all neg; LABS showed WBC~13K, anemia w/ Hg~11, Chems- ok x elev BS (his PCP is [name]); BNP was 29; ABGs showed pH=7.38, pCO2=58, pO2=79 on [place] O2; note that Bicarb level ~30;  2DEcho showed EF 60-65%, Gr1DD, PAsys~757mg;  He was felt to has a COPD exac, Cor Pulmonale- treated w/ BiPAP (he was INTOL to CPAP), given IV Solumedrol (DC on Pred taper), IV Zithromax, NEB treatments and improved...    He now returns & notes that he likes the BiPAP, feels like it is really helping (resting better, sleeps thru the night, wakes refreshed, no daytime sleepiness, & naps 2/7 days now) & wants to continue the BiPAP; NOTE that we had rec a sleep study to him on mult occas but he has always refused!  He now agrees to a sleep study, hoping that it will lead to medicare approval for his home BiPAP => HE QUALIFIES FOR BiPAP BASED ON COPD DIAGNOSIS w/ ABG SHOWING pCO2>52 ON HIS NASAL CANNULA O2, he needs the SLEEP OXIMETRY STUDY confirming O2sat<88% for cumulative >77m21mwhile on his prescribed O2, and OSA/CPAP treatment have been considered & ruled out... He has a home O2conc & a POC which he uses 3-4L pulse dose w/ his home sats in the 90s at rest & down to 88% w/  exerc...     COPD, mixed type w/ emphysema>  He has GOLD Stage3 COPD & on O2 as noted, NEBS w/ DUONEB Qid & BUDESONIDE0.25Bid, plus ProairHFA prn when out & about; [facility] the Pred [facility]...    Ex-smoker>  He quit late in 2016...    Chronic resp failure w/ hypoxemia and hypercarbia>  On above meds + ASA, Metoprolol, Imdur, Lasix    Cor pulmonale, secondary pulm hypertension>  He is on O2 24/7 using 4L/min continuous at night & 4L/min pulse dose during the day...    CARDIAC issues>  HBP, CAD w/ IWMI 1995 & mult PCIs, HL (on Simva20)- all followed by [name]...    MEDICAL issues>  HBP,  HL, DM, obesity, gastritis, divertics, colon polyps, BPH, hx bladder cancer, DJD, anxiety EXAM shows Afeb, VSS, O2sat=97% on 4L/min [place];  Wt=240#;  HEENT- neg, mallampati3;  Chest- decr BS bilat, otherw clear w/o w/r/r;  Heart- RR w/o m/r/g;  Abd- obese, soft;  Ext- neg w/o c/c/e. IMP/PLAN>>  [name] needs to continue his O2, NEBS w/ Duoneb & Budes regularly; he wants to continue the BiPAP & we need to see if he meets Medicare's criteria for BiPAP device- proceed w/ Sleep Study...   ADDENDUM>>  Sleep Study 10/15/16>  AHI=1.8/hr (8 hypopneas & 4 RERAs;  AHI during REM = 17.6/hr;  Study done on 4LO2 & min O2sat= 88%;  Mod snoring noted;  No arrhythmias;  PLMS index= 37, but assoc arousals was only 0.2;  He appears to meet the criteria for BiPAP & we will submit to [employer]/ [employer]... Plan ROV recheck in 6-8 weeks.          Problem List:  Hx of SINUSITIS (ICD-473.9) - off Nasonex now & recent Rx w/ Augmentin/ Saline... notes occas sinus congestion, drainage, sl HA, etc... he knows that he needs to quit [name]! ~  10/10: Adm for epistaxis w/ eval by DrWolicki- CXR= NAD, labs OK, rec for saline lavage- nose bleed resolved & disch home (no recurrence so far). ~  1/16: he presented w/ acute max & frontal sinusitis> Rx w/ Augmentin, Align, Saline, Mucinex, etc... ~  3/17: he has acute  bronchitic exac- treated w/ Levaquin x7d + his regular meds...  COPD (ICD-496) >>   ~  on ADVAIR 250Bid; STILL SMOKES CIGARETTES & he's refused Chantix...  ~  He had hemoptysis 6/99 without lesion on CXR, CTChest, or Bronchoscopy... ~  Adm 10/10 w/ epistaxsis & eval DrWolicki as noted> he wanted to do Sleep Study but pt cancelled it & declined to resched. 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 ~  CXR 5/12 showed ectatic calcif & tort Aorta, COPD w/ incr markings ar bases, NAD, +degen spondylosis. ~  CXR 6/13 showed COPD/ emphysema, scarring in the lower lobes, otherw clear lungs, DJD in spine... ~  CXR 7/14 showed COPD/ emphysema, incr markings and scarring at the bases, NAD. ~  1/15: [name] says he's decr his [name] from prev 3-4ppd down to 3 cig/d;  He remains on Advair250> states breathing is ok, at baseline CAT score=12; baseline cough, [name], no hemoptysis, stable DOE, no edema... ~  CXR 7/15 showed underlying COPD, mild bibasilar fibrosis, norm heart size, DJD in spine, NAD...  ~  PFT 7/15 showed> FVC=3.18 (78%), FEV1=1.22 (38%), %1sec=38; mid-flows=13% predicted... This is c/w GOLD Stage3 COPD and we discussed this> He is rec to quit all [name] immediately; he has been using the nicotine gum & rec to try patches, offered Wellbutrin, he refuses Chantix; needs to continue ADVAIR250Bid & samples givem; he declines to start [place] at this time due to cost & donut hole (we will address this again on return); reminded to use MUCINEX 673m- 1to2 Bid w/ fluids, and OK to use OTC DELSYM as needed for cough...   ~  1/16: he claims stable on Advair250 but still [name] & can't vs won't quit; we decided to add Spiriva daily via handihaler... ~  7/16: on Advair250Bid & Spiriva daily; symptoms as above, he is stoic, still [name] 1ppd & NOT motivated to quit, asked to cut back & use both inhalers regularly;  We reviewed the low-dose screening CT Chest program for the early detection of lung  cvancer- he will consider this & let me know his decision... ~  12/16:  He was [facility] for [facility]d w/ hypoxemic resp failure, COPD exac, and found to have pulmHTN w/ 2DEcho showing PAsys=51;  Treated w/ O2, Pred taper, antibiotics, NEBS, Advair/Spiriva, etc;  He says he has quit [name]... ~  [facility] 10/[facility]> CXR- COPD, bullous dis, incr markings at bases, NAD; Cultures of blood/ [name]/ urine were all neg; Serologies for strep & legionella were neg; Viral panel was neg; He was treated w/ Oxygen, IV Solumedrol, Rocephin/ Zithromax, NEBS, Lovenox, flutter, etc; He was disch on Home O2- 2L/min activ & 1L/min rest, Pred taper, NEBS, Levaquin, Advair250/ Spiriva => improved. ~  3/17:  severe obstructive lung dis, GOLD Stage 3, chr hypoxemic resp failure w/ pulmHTN (WHO group3); he quit [name] 10/2015 & is using home O2 3L/min, NEBS w/ Albut tid, Advair250Bid, Spiriva daily, Mucinex etc; he has acute bronchitis exac- Rx w/ Levaquin x7d, continue Home O2 24/7 and push to start Pulm Rehab...   HE HAS ESTABLISHED PRIMARY [name] w/ [name], [facility]>>   HYPERTENSION (ICD-401.9) - controlled on METOPROLOL 68mTid,  NORVASC 159md, LISINOPRIL 4058m...   ~  11/11:  BP=122/70, not checking BP's at home... denies HA, fatigue, visual changes, CP, palipit, dizziness, syncope, dyspnea, edema, etc. ~  5/12:  BP= 120/72, pt very pleased w/ his wt loss & improved exercise ability. ~  6/13:  BP= 122/64 & feeling well w/o CP, palpit, SOB, edema, etc... ~  12/13:  BP=122/82 & he denies CP, palpit, SOB, edema... ~  7/14: on Metop25-3/d, Amlod10, Lisin40; BP=130/78 & he denies CP, palpit, SOB, edema ~  1/15: BP remains stable on Metop25-2AM & 1PM, Amlod5, Lisin40; BP= 120/72 and he denies CP, palpit, ch in SOB, edema. ~  2016> His meds have been adjusted- now on Rx w/ Amlod10, Lisin10; BP= 130/72 7 he denies CP, palpit, edema; chr SOB/DOE due to his COPD.  CORONARY ARTERY DISEASE (ICD-414.00) - ASA 72m59m& off Plavix as of  2/13 officially as he wasn't taking it regularly anyway. ~  Hx prev IWMI and PTCA/ stent in RCA by DrBrodie 12/95 & 5/96...  ~  Myoview 4/08 abnormal w/ inferior ischemia and subseq cath revealing restenoses in RCA- s/p repeat PTCA/ stents...  ~  f/u Myoview 9/08 w/ prev infer infarct, no ischemia, EF=56%... ~  9/11:  pre op cardiac eval DrBrodie> OK to hold ASA/ Plavix for bladder surg. ~  2/12: f/u [name] w/ hx CAD, MI in 95, mult PCIs last 2009> stable, on ASA/ Plavix, no changes made; EKG= NSR, WNL... ~  2/13:  He had f/u [name] w/ Myoview showing a fixed defect in infer wall, no ischemia, EF=58%; pt wasn't taking Plavix, therefore stopped in favor of ASA72mg38m. ~  3/14:  He had f/u [name]> CAD, IWMI 1995, mult PCIs, last Myoview 2013 was low risk; too sedentary, no changes made; 73 y/47brother died recently w/ AAA rupture... ~  EKG 3/14 showed NSR, rate65, WNL, NAD... ~  He maintains f/u w/ CARDS, [name]- known CAD, Hx inferior MI 1995, s/p mult PCIs- most recent 2009; finally qit [name] 12/16 [facility]/ [facility]; on ASA, statin, Amlod & Lisin.  HYPERCHOLESTEROLEMIA (Mixed Hyperlipidemia) - prev on Simva80 but DrBrodie decr him to SIMVASTATIN 20mg/70mFish Oil & Red Wine qd... ~  FLP 9/[place] Simva40 showed TChol 151, TG 106, HDL 23, LDL 107 ~  FLP 8/09 on Simva80 showed TChol 142, TG 135,  HDL 28, LDL 87 ~  FLP 6/10 on Simva80 showed TChol 151, TG 117, HDL 31, LDL 97 ~  FLP in hosp 10/10 showed TChol 128, TG 110, HDL 26, LDL 80 ~  FLP 5/11 on Simva80 showed TChol 144, TG 215, HDL 26, LDL 89 ~  9/11:  DrBrodie decr him to Simva20... we discussed the need for better diet & wt reduction. ~  FLP 5/12 on Simva20 showed TChol 162, TG 159, HDL 32, LDL 98 ~  FLP 6/13 on Simva20 showed TChol 130, TG 102, HDL 33, LDL 77... rec to incr exercise program & get wt down! ~  FLP 7/14 on Simva20 showed TChol 158, TG 145, HDL 33, LDL 96... He has gained way too much wt 7 must get on  diet! ~  Followed by PCP- [name]...  DIABETES MELLITUS, BORDERLINE (ICD-790.29) - on diet alone... ~  labs 9/08 showed BS=100, HgA1c=6.1 ~  labs 8/09 showed BS= 107, A1c= 6.2 ~  labs 6/10 showed BS= 108, A1c= 6.2 ~  lab 5/11 (wt=244#) showed BS= 121. A1c= 6.7.[name]. he was told "get wt down, or start meds". ~  Labs 5/12 showed BS= 109, A1c= 6.1 ~  Labs 6/13 showed BS= 114, & he needs to lose the weight... ~  Labs 7/14 on diet alone showed BS= 89, A1c= 6.7 ~  Followed by PCP- [name]...  OBESITY (ICD-278.00) - weight was down as low as 202# in 2/09, and steadily incr to 244# by 5/11... ~  weight 5/11 = 244#.[name].  we reviewed diet + exercise necessary to lose weight... ~  weight 11/11 = 217# (after dx & rx for bladder cancer) ~  Weight 5/12 = 214# but says it's 192# at home... ~  Weight 6/13 = 211# but knows it's better since his pant/ belt is down to 36" (under his roll). ~  Weight 7/14 = 230# but he has quit [name] he says, now must lose the weight... ~  Weight 1/15 = 229# ~  Weight 1/17 = 216# ~  Weight 3/17 = 231# and we reviewed diet, exercise, wt reducing strategies...  GASTRITIS (ICD-535.50) - had EGD 10/05 showing gastritis... on RANITADINE 318m/d due to his Plavix Rx, off prev Omep.  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 ~  4/12:  Pt reported f/u cysto was neg, doing well... ~  10/12:  Note from [name] reviewed> cysto was neg, no recurrent tumors... ~  4/13:  Note from [name] reviewed> neg f/u cysto w/o recurrent TCCa... He wants to continue Q6mo19motoscopies... ~  1/14:  He had f/u w/ [name]> Hx  TCCa bladder, s/p TURBT 9/11, given BCG 11/11-12/11 and f/u Cysto 1/12 was neg; repeat cysto 1/14 was neg as well...  DEGENERATIVE JOINT DISEASE (ICD-715.90) - He has c/o right shoulder pain, hip pain, knee pain, and LBP> Rx ADVIL w/ some benefit... offered Ortho referral & he will decide.  ANXIETY (ICD-300.00) - on ALPRAZOLAM 0.5mg 85m...   Past Surgical History:  Procedure Laterality Date  . cataract surgery  septemeber 2013  . cataract surgery/sub posterior vitrectomy in left eye  1996   Dr.  Rankin  . PTCA  [phone]    Outpatient Encounter Prescriptions as of 10/13/2016  Medication Sig  . albuterol (PROVENTIL HFA;VENTOLIN HFA) 108 (90 BASE) MCG/ACT inhaler Inhale 2 puffs into the lungs 4 (four) times daily as needed for wheezing (for use when not at home / unable to use nebulizer).  . Albuterol Sulfate (PROAIR RESPICLICK) 503 (90 Base) MCG/ACT AEPB Inhale 2 puffs into the lungs every 6 (six) hours as needed. (Patient taking differently: Inhale 2 puffs into the lungs every 6 (six) hours as needed (shortness of breath). )  . ALPRAZolam (XANAX) 0.5 MG tablet Take 0.5 mg by mouth 3 (three) times daily as needed for anxiety or sleep.  [name] aspirin 81 MG tablet Take 81 mg by mouth at bedtime.   . budesonide (PULMICORT) 0.25 MG/2ML nebulizer solution Take 2 mLs (0.25 mg total) by nebulization 2 (two) times daily.  . Flaxseed, Linseed, (FLAXSEED OIL) 1000 MG CAPS Take 1 capsule by mouth daily.    . furosemide (LASIX) 40 MG tablet Take 40 mg by mouth 2 (two) times daily.  [name] ibuprofen (ADVIL,MOTRIN) 600 MG tablet Take 600 mg by mouth every 6 (six) hours as needed for moderate pain.  [name] ipratropium-albuterol (DUONEB) 0.5-2.5 (3) MG/3ML SOLN Take 3 mLs by nebulization 4 (four) times daily.  . isosorbide mononitrate (IMDUR) 30 MG 24 hr tablet Take 1 tablet (30 mg total) by mouth daily. (Patient taking differently: Take 30 mg by mouth 2 (two) times daily. )  . metoprolol succinate (TOPROL-XL) 25 MG  24 hr tablet TAKE 2 TABLETS BY MOUTH IN THE MORNING AND 1 TABLET BY MOUTH IN THE THE EVENING  . Multiple Vitamin (MULTIVITAMIN) capsule Take 1 capsule by mouth daily.    . nitroGLYCERIN (NITROSTAT) 0.4 MG SL tablet Place 1 tablet (0.4 mg total) under the tongue every 5 (five) minutes as needed for chest pain.  . Omega-3 Fatty Acids (FISH OIL) 1200 MG CAPS Take 1 capsule by mouth daily.    . simvastatin (ZOCOR) 20 MG tablet Take 1 tablet (20 mg total) by mouth daily at 6 PM.  . sodium chloride (OCEAN) 0.65 % SOLN nasal spray Place 1 spray into both nostrils as needed for congestion.  . [DISCONTINUED] lisinopril (PRINIVIL,ZESTRIL) 10 MG tablet Take 1 tablet (10 mg total) by mouth daily. (Patient not taking: Reported on 10/13/2016)  . [DISCONTINUED] predniSONE (DELTASONE) 10 MG tablet Take 1 tablet (10 mg total) by mouth daily with breakfast. Take 50m (4 tabs) x 3 days, then taper to 32m(3 tabs) x 3 days, then 2063m2 tabs) x 3days, then 72m39m tab) x 3days, then stop. (Patient not taking: Reported on 10/13/2016)   No facility-administered encounter medications on file as of 10/13/2016.     Allergies  Allergen Reactions  . Amlodipine Swelling    Current Medications, Allergies, Past Medical History, Past Surgical History, Family History, and Social History were reviewed in [employer].    Review of Systems        See HPI - all other systems neg except as noted... The patient complains of some dyspnea on exertion.  The patient denies anorexia, fever, weight loss, weight gain, vision loss, decreased hearing, hoarseness, chest pain, syncope, peripheral edema, prolonged cough, headaches, hemoptysis, abdominal pain, melena, hematochezia, severe indigestion/heartburn, hematuria, incontinence, muscle weakness, suspicious skin lesions, transient blindness, difficulty walking, depression, unusual weight change, abnormal bleeding, enlarged lymph nodes, and angioedema.      Objective:   Physical Exam  WD, Obese, 70 y/o WM in NAD... Vital Signs:  Reviewed... GENERAL:  Alert & oriented; pleasant & cooperative... HEENT:  Sac/AT, EOM-wnl, PERRLA, EACs-clear, TMs-wnl, NOSE- sl congestion, THROAT-clear & wnl. NECK:  Supple w/ fairROM; no JVD; normal carotid impulses w/o bruits; no thyromegaly or nodules palpated; no lymphadenopathy. CHEST:  decr BS at bases bilat w/ few scat rhonchi, no wheezing/ rales/ or signs of consolidation... HEART:  Regular Rhythm; without murmurs/ rubs/ or gallops detected... ABDOMEN:  Obese, soft, & nontender; Andrew knot palpated in mid abd above umbilicus; normal bowel sounds; no organomegaly detected. EXT: without deformities, mild arthritic changes; no varicose veins/ +venous insuffic/ no edema. NEURO:  CN's intact; no focal neuro deficits... DERM:  No lesions noted; no rash etc...  RADIOLOGY DATA:  Reviewed in the EPIC EMR & discussed w/ the patient...  LABORATORY DATA:  Reviewed in the EPIC EMR & discussed w/ the patient...   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  11/21/15>  referred for PulmRehab, contact [place] re- POC, he still needs Full PFTs & CT Chest... We plan ROV in 6 wks 02/02/16> We discussed continuing the NEBS Tid followed by Advair250Bid & Spiriva daily; add Mucinex 1279mBid & we will Rx w/ Levaquin500/d x7d for his bronchitic infection (see AVS);  He still needs a CT Chest but wants to wait for now;  [facility], and he wants diff DME supplier- we will inquire, plan ROV 336mo/21/17>   [place] so we switched to NEBS w/ Duoneb Qid & Budes Bid; added ProairHFA prn; he desperately needs to lose wt in conjunction w/ his PulmRehab etc... 08/05/16>   He presents w/ a COPD exac-- Rx w/ Levaquin, Pred20-4d taper + NEBS w/ duoneb Qid & budes Bid...   Primary [name] followed by [name]>> HBP>   CAD>            Followed by [name] & DrWilburt FinlayIPIDS>   DM>   GI> Hx Gastritis, Polyps>   GU>   Followed by [name]   Patient's Medications  New Prescriptions   No medications on file  Previous Medications   ALBUTEROL SULFATE (PROAIR RESPICLICK) [id number] BASE) MCG/ACT AEPB    Inhale 2 puffs into the lungs every 6 (six) hours as needed.   ALPRAZOLAM (XANAX) 0.5 MG TABLET    Take 0.5 mg by mouth 3 (three) times daily as needed for anxiety or sleep.   ASPIRIN 81 MG TABLET    Take 81 mg by mouth at bedtime.    BUDESONIDE (PULMICORT) 0.25 MG/2ML NEBULIZER SOLUTION    Take 2 mLs (0.25 mg total) by nebulization 2 (two) times daily.   FLAXSEED, LINSEED, (FLAXSEED OIL) 1000 MG CAPS    Take 1 capsule by mouth daily.     FUROSEMIDE (LASIX) 40 MG TABLET    Take 40 mg by mouth 2 (two) times daily.   IBUPROFEN (ADVIL,MOTRIN) 600 MG TABLET    Take 600 mg by mouth every 6 (six) hours as needed for moderate pain.   IPRATROPIUM-ALBUTEROL (DUONEB)  0.5-2.5 (3) MG/3ML SOLN    Take 3 mLs by nebulization 4 (four) times daily.   ISOSORBIDE MONONITRATE (IMDUR) 30 MG 24 HR TABLET    Take 1 tablet (30 mg total) by mouth daily.   METOPROLOL SUCCINATE (TOPROL-XL) 25 MG 24 HR TABLET    TAKE 2 TABLETS BY MOUTH IN THE MORNING AND 1 TABLET BY MOUTH IN THE THE EVENING   MULTIPLE VITAMIN (MULTIVITAMIN) CAPSULE    Take 1 capsule by mouth daily.     NITROGLYCERIN (NITROSTAT) 0.4 MG SL TABLET    Place 1 tablet (0.4 mg total) under the tongue every 5 (five) minutes as needed for chest pain.   OMEGA-3 FATTY ACIDS (FISH OIL) 1200 MG CAPS    Take 1 capsule by mouth daily.     SIMVASTATIN (ZOCOR) 20 MG TABLET    Take 1  tablet (20 mg total) by mouth daily at 6 PM.   SODIUM CHLORIDE (OCEAN) 0.65 % SOLN NASAL SPRAY    Place 1 spray into both nostrils as needed for congestion.  Modified Medications   No medications on file  Discontinued Medications   LISINOPRIL (PRINIVIL,ZESTRIL) 10 MG TABLET    Take 1 tablet (10 mg total) by mouth daily.   PREDNISONE (DELTASONE) 10 MG TABLET    Take 1 tablet (10 mg total) by mouth daily with breakfast. Take 85m (4 tabs) x 3 days, then taper to 344m(3 tabs) x 3 days, then 20170m2 tabs) x 3days, then 70m23m tab) x 3days, then stop.  NOTE>> Advair & Spiriva discontinued due to cost & replaced by DUONEB-Qid & PULMICORT 0,25mg24mvia NEBULIZER...Marland KitchenMarland Kitchen

## 2016-11-10 ENCOUNTER — Encounter: Payer: Self-pay | Admitting: Pulmonary Disease

## 2016-11-17 ENCOUNTER — Other Ambulatory Visit: Payer: Self-pay | Admitting: *Deleted

## 2016-11-23 ENCOUNTER — Ambulatory Visit (HOSPITAL_BASED_OUTPATIENT_CLINIC_OR_DEPARTMENT_OTHER): Payer: Medicare Other

## 2016-11-24 ENCOUNTER — Encounter: Payer: Self-pay | Admitting: Pulmonary Disease

## 2016-11-24 ENCOUNTER — Other Ambulatory Visit (INDEPENDENT_AMBULATORY_CARE_PROVIDER_SITE_OTHER): Payer: Medicare Other

## 2016-11-24 ENCOUNTER — Ambulatory Visit (INDEPENDENT_AMBULATORY_CARE_PROVIDER_SITE_OTHER): Payer: Medicare Other | Admitting: Pulmonary Disease

## 2016-11-24 VITALS — BP 130/74 | HR 75 | Temp 99.1°F | Ht 66.0 in | Wt 257.6 lb

## 2016-11-24 DIAGNOSIS — E662 Morbid (severe) obesity with alveolar hypoventilation: Secondary | ICD-10-CM

## 2016-11-24 DIAGNOSIS — I2510 Atherosclerotic heart disease of native coronary artery without angina pectoris: Secondary | ICD-10-CM

## 2016-11-24 DIAGNOSIS — F419 Anxiety disorder, unspecified: Secondary | ICD-10-CM

## 2016-11-24 DIAGNOSIS — J449 Chronic obstructive pulmonary disease, unspecified: Secondary | ICD-10-CM

## 2016-11-24 DIAGNOSIS — I10 Essential (primary) hypertension: Secondary | ICD-10-CM

## 2016-11-24 DIAGNOSIS — R109 Unspecified abdominal pain: Secondary | ICD-10-CM

## 2016-11-24 DIAGNOSIS — I272 Pulmonary hypertension, unspecified: Secondary | ICD-10-CM | POA: Diagnosis not present

## 2016-11-24 DIAGNOSIS — J9612 Chronic respiratory failure with hypercapnia: Secondary | ICD-10-CM

## 2016-11-24 DIAGNOSIS — Z6838 Body mass index (BMI) 38.0-38.9, adult: Secondary | ICD-10-CM

## 2016-11-24 DIAGNOSIS — J9611 Chronic respiratory failure with hypoxia: Secondary | ICD-10-CM

## 2016-11-24 DIAGNOSIS — R140 Abdominal distension (gaseous): Secondary | ICD-10-CM | POA: Diagnosis not present

## 2016-11-24 DIAGNOSIS — D539 Nutritional anemia, unspecified: Secondary | ICD-10-CM | POA: Diagnosis not present

## 2016-11-24 DIAGNOSIS — I420 Dilated cardiomyopathy: Secondary | ICD-10-CM

## 2016-11-24 DIAGNOSIS — E66812 Obesity, class 2: Secondary | ICD-10-CM

## 2016-11-24 LAB — COMPREHENSIVE METABOLIC PANEL
ALBUMIN: 4.2 g/dL (ref 3.5–5.2)
ALT: 23 U/L (ref 0–53)
AST: 24 U/L (ref 0–37)
Alkaline Phosphatase: 42 U/L (ref 39–117)
BUN: 8 mg/dL (ref 6–23)
CALCIUM: 9.4 mg/dL (ref 8.4–10.5)
CHLORIDE: 102 meq/L (ref 96–112)
CO2: 30 mEq/L (ref 19–32)
Creatinine, Ser: 0.64 mg/dL (ref 0.40–1.50)
GFR: 130.8 mL/min (ref >60.00)
Glucose, Bld: 117 mg/dL — ABNORMAL HIGH (ref 70–99)
POTASSIUM: 4.1 meq/L (ref 3.5–5.1)
Sodium: 139 mEq/L (ref 135–145)
Total Bilirubin: 2 mg/dL — ABNORMAL HIGH (ref 0.2–1.2)
Total Protein: 6.9 g/dL (ref 6.0–8.3)

## 2016-11-24 LAB — CBC WITH DIFFERENTIAL/PLATELET
BASOS ABS: 0 10*3/uL (ref 0.0–0.1)
Basophils Relative: 0.3 % (ref 0.0–3.0)
EOS ABS: 0.2 10*3/uL (ref 0.0–0.7)
EOS PCT: 3.6 % (ref 0.0–5.0)
HCT: 31.5 % — ABNORMAL LOW (ref 39.0–52.0)
Hemoglobin: 10.7 g/dL — ABNORMAL LOW (ref 13.0–17.0)
LYMPHS ABS: 1.2 10*3/uL (ref 0.7–4.0)
Lymphocytes Relative: 18.9 % (ref 12.0–46.0)
MCHC: 34.1 g/dL (ref 30.0–36.0)
MCV: 95.1 fl (ref 78.0–100.0)
MONO ABS: 0.6 10*3/uL (ref 0.1–1.0)
Monocytes Relative: 9 % (ref 3.0–12.0)
Neutro Abs: 4.2 10*3/uL (ref 1.4–7.7)
Neutrophils Relative %: 68.2 % (ref 43.0–77.0)
Platelets: 186 10*3/uL (ref 150.0–400.0)
RBC: 3.31 Mil/uL — AB (ref 4.22–5.81)
RDW: 16.8 % — ABNORMAL HIGH (ref 11.5–15.5)
WBC: 6.2 10*3/uL (ref 4.0–10.5)

## 2016-11-24 LAB — IBC PANEL
IRON: 191 ug/dL — AB (ref 42–165)
SATURATION RATIOS: 56.1 % — AB (ref 20.0–50.0)
TRANSFERRIN: 243 mg/dL (ref 212.0–360.0)

## 2016-11-24 LAB — TSH: TSH: 1.28 u[IU]/mL (ref 0.35–4.50)

## 2016-11-24 LAB — FERRITIN: FERRITIN: 109.1 ng/mL (ref 22.0–322.0)

## 2016-11-24 NOTE — Patient Instructions (Signed)
Today we updated your med list in our EPIC system...    Continue your current medications the same...  We are rechecking your blood work AND we will arrange for an outpt ABD ULTRASOUND to check for fluid in your abd...    We will contact you w/ the results when available...   Stay on your oxygen 24/7...  Try to increase the amount of home exercise as we discussed...  Call for any questions...  Let's plan a follow up visit in 94mo sooner if needed for problems..Marland KitchenMarland Kitchen

## 2016-11-24 NOTE — Progress Notes (Addendum)
Subjective:    Patient ID: [name], male    DOB: 1946-07-19, 71 y.o.   MRN: [id number]  HPI 71 y/o WM here for a follow up visit and on-going management of mult med problems...  ~  SEE PREV EPIC NOTES FOR OLDER DATA >>    CXR 7/14 showed COPD/ emphysema, incr markings and scarring at the bases, NAD...  LABS 7/14:  FLP- ok on Simva20;  Chems- ok x BS=120 A1c=6.7;  CBC- wnl;  TSH=1.26;  PSA=0.41...  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   PFT 7/15 showed> FVC=3.18 (78%), FEV1=1.22 (38%), %1sec=38; mid-flows=13% predicted... This is c/w GOLD Stage3 COPD and we discussed this...  ~  December 05, 2014:  43moROV & pulmonary recheck; [name] continued smoking, perhaps decr slightly to 1/2ppd but unable to quit & he has gained 6# in the interim up to 221# today;  His CC is sinusitis w/ nasal congestion, drainage & green mucus plus maxillary/ frontal HAs; he denies f/c/s, notes stable cough & chest symptoms w/ DOE;  He has been taking his Advair250Bid but prev refused addition of an anticholinergic due to "the donut hole";  States his breathing is "fine, except in cold air" ( we discussed wearing a scarf in winter etc)... He has GOLD Stage3 COPD w/ FEV1=1.22 (38%predicted) done 7/15;;  Exam shows decr BS bilat w/ few scat rhonchi & mild exp wheezing esp w/ forced maneuver...     We reviewed prob list, meds, xrays and labs> see below for updates >> he is followed by [name] and sees him once per year... PLAN>> we discussed treating his acute max sinusitis w/ Augmentin875Bid, plus align daily & nasal saline as needed; he knows the importance of smoking cessation but is not motivated & declines smoking cessation help; he will continue the Advair250Bid 7 we are adding spiriva via handihaler once per day; we plan ROV in 627mo/ f/u CXR & PFT,  call in the interim for any breathing issues....   ~  June 05, 2015:  75m21moV & [name] a good interval, no new complaints or concerns; still smoking +/-1ppd and blames family stress; notes cough, sm amy yellow sput, no hemoptysis, SOB w/o change/stable- still mows 3 yards, does chores, tends to 20+chickens & their eggs, etc; he has repeatedly declined smoking cessation help, clearly not motivated to quit, not even doing his inhalers regularly!!!    COPD, +smoker> on Advair250Bid & Spiriva daily; symptoms as above, he is stoic, still smoking 1ppd & NOT motivated to quit, asked to cut back & use both inhalers regularly...    HBP> on Metop25Bid, Amlod10, Lisin40; BP=114/64 & he denies CP, palpit, SOB, edema...    CAD> on ASA81; followed by [name] & seen 4/15 (overdue for f/u)> CAD, IWMI 1995, mult PCIs, last Myoview 2013 was low risk; too sedentary, no changes made; 73 48o brother died w/ AAA rupture...    Chol> on Simva20; FLP 7/14 showed TChol 158, TG 145, HDL 33, LDL 96; needs better diet & wt reduction and ret for Fasting blood work overdue.    DM> on diet alone; LABS 7/14 showed BS= 120, A1c= 6.7; we reviewed low carb diet, exercise program, and wt reduction...    Obese> wt recorded as 204# today but it doesn't look like he's lost 26#; we reviewed diet &  exercise...    GI- Gastritis, polyps> prev on Zantac; prev colon 2004 w/ hyperplastic polyp removed & f/u 8/14 by [name] w/ 3 polyps removed (5-60m size, all were tubular adenomas), +divertics in sigmoid, f/u planned 5 yrs... NOTE> exam showed a small palp knot in mid-abd above the umbilicus ?etiology (I offered CT Abd but he declines & wants to discuss w/ his primary- [name]).    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    DJD> on OTC analgesics  as needed...    Anxiety> on Alpraz0.18m57mrn... We reviewed prob list, meds, xrays and labs> see below for updates >>  NOTE> HE IS FOLLOWED BY [name] for GEN MED/ PCP now...  CXR 06/05/15 showed stable heart size, Ao atherosclerosis & tortuousiy, COPD/emphysema, incr markings at the bases, arthritis in Tspine...  LABS 7/16: pt asked to ret for fasting blood work... IMP/PLAN>>  [name] he is stable but continues to smoke 1ppd; CXR w/ COPD/emphysema, atherosclerotic Ao, chronic changes but NAD- we discussed the low-dose screening CT Chest for the early detection of lung cancer (he is a candidate & he will consider participating in this program); in the meanwhile- continue Advair/ Spiriva regularly;  He is overdue for Cards f/u w/ [name], and needs to continue Q6mo29mo w/ [name] (w/ copy of notes to us);[place] will ret for FASTING blood work.  ADDENDUM>> given his age and smoking history, [name] candidate for LUNG CANCER SCREENING w/ LOW-DOSE CT Chest>>  This office visit constitutes a face-to-face visit for the purpose of lung cancer screening counseling and a shared decision making visit...  Eligibility for LDCT screening:  1) Age=70 (must be 55-77y/o);  2) Absence of symptoms of lung cancer: he denies CP, ch in SOB, hemoptysis.  Smoking History:  1) He has 60+ pk-yrs (must have >30 pk-yr hx smoking);  2) Smoking status- still smoking ~1ppd (current or former smoker who quit<15 yrs ago).  Counseling:  Importance of smoking cessation &/or continued abstinence discussed ; offered smoking cessation interventions- pt declined; pt agreed to continued LDCT screening annually.  Shared decision making:  We reviewed the potential benefits and harms of screening> eg. Early detection of lung cancer and improved survival, the need for additional diagnostic tests & possible surgery, the risk of over-diagnosis/ false positives/ and radiation exposure... All questions were answered to the best  of my ability & he would like to consider the LDCT screening & he will let us kKoreaw his decision=> he ignored this discussion & never called.  ~  October 31, 2015:  5 month ROV & post hospital follow up visit>  [name] was Adm 12/5 - 10/29/15 by [facility] w/ acute hypoxemic resp failure precipitated by a COPD exac; for his part he noted incr SOB, cough, green sput, & "slowing down", he denied f/c/s, no CP, etc;  In the ER he was hypoxemic w/ O2sats in the 70s on RA and eval revealed> CXR- COPD, bullous dis, incr markings at bases, NAD; Cultures of blood/ sput/ urine were all neg; Serologies for strep & legionella were neg; Viral panel was neg;  LABs were OK (WBC=7.9=>14, Hg=15.3=>13.9, BS=100-140, BNP=498);  EKG showed NSR, rate78, NSSTTWA;  2DEcho 10/21/15 showed mildLVH, norm LVF w/ EF=55-60%, no regional wall motion abn, LA-mod  dil, RA- mod to severe dil, RV- mod dil, mod TR, PAsys-46mHg... He was treated w/ Oxygen, IV Solumedrol, Rocephin/ Zithromax, NEBS, Lovenox, flutter, etc; he was disch on Home O2- 2L/min activ & 1L/min rest, Pred taper, Levaquin, Advair250/ Spiriva...  He returns today feeling better, notes his home pulse-ox checks are improving & he has less SOB/DOE, less cough/sput, etc;  He says he has not smoked since PTA...      EXAM shows Afeb, VSS, O2sat=94% on 2L/min [place];  Wt=210# (he was 204 prev);  HEENT- neg, mallampati1;  Chest- decr BS bilat, few scat rhonchi, no w/r/consolidation;  Heart- RR w/o m/r/g;  Abd- obese, soft;  Ext- neg w/o c/c/Andrew...  IMP/PLAN>>  COPD/emphysema, GOLD Stage 3, chronic hypoxemic resp failure- on Home O2 now, exsmoker- quit 10/2015, pulmonary HTN (PAsys by EClarene Duke- WHO group 3>  I congratulated him on not smoking & we discussed continuing the Oxygen regularly ([facility], Prednisone taper, Advair, Spiriva, NEBS w/ Albut, Mucinex/flutter;  He will follow up in 3 wks when the Pred has been tapered & we briefly discussed the need for Full PFTs and CT Chest later...    ~  November 21, 2015:  3wk ROV & [name] reports that he feels much better, SOB improved, cough improved, still prod of a sm amt green sput w/o blood w/o f/c/s;  He's been using the NEB w/ Albut TID followed by his AdvairBid & SpirivaQd; he is off the Pred now, he is too sedentary & not exercising => he needs PULM REHAB & we will refer, [facility] we will inquire as to what he might be elig for...       EXAM shows Afeb, VSS, O2sat=98% on 2L/min  [place];  Wt=216# (he was 204-210# prev);  HEENT- neg, mallampati2;  Chest- decr BS bilat, otherw clear w/o w/r/r;  Heart- RR w/o m/r/g;  Abd- obese, soft;  Ext- neg w/o c/c/Andrew...  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 We plan ROV in 6 wks.  ~  February 03, 2016:  2-342moOV & ErSharman [name] w/ his severe obstructive lung dis, GOLD Stage 3, chr hypoxemic resp failure w/ pulmHTN (WHO group3); he quit smoking 10/2015 & is using home O2 3L/min, NEBS w/ Albut tid, Advair250Bid, Spiriva daily, Mucinex etc... He reports that breathing is stable overall but he is c/o 53m56mo URI w/ cough, thick yellow sput hard to produce, incr SOB 7 chest tightness but no fever, no CP=> he wants another antibiotic... He spent much time ventilating about his problems w/ DME-[facility], his O2 set-up, & [facility] program- they still haven't contacted him about starting Rehab... He does have mult entries into EPIC from THNKennanotes reviewed...     EXAM shows Afeb, VSS, O2sat=92% on 3L/min [place];  Wt=231# (up 15# & he was 204-210# prev);  HEENT- neg, mallampati2;  Chest- decr BS bilat, otherw clear w/o w/r/r;  Heart- RR w/o m/r/g;  Abd- obese, soft;  Ext- neg w/o c/c/Andrew.  PFT 02/03/16>  FVC=2.57 (68%), FEV1=1.05 (38%), %1sec=41, mid-flows reduced at 18% predicted; post bronchodil  FEV1 w/o change;  TLC=6.67 (107%), RV=3.97 (176%), RV/TLC=60;  DLCO=22% and DL/VA=29%... This is c/w a severe obstructive ventilatory defect w/ air trapping and severely decr DLCO c/w emphysema- GOLD Stage  3 COPD... IMP/PLAN>>  We discussed continuing the NEBS Tid followed by Advair250Bid & Spiriva daily; add Mucinex 1236mBid & we will Rx w/ Levaquin500/d x7d for his bronchitic infection (see AVS);  He still needs a CT Chest but wants to wait for now;  [facility], and he wants diff DME supplier- we will inquire, plan ROV 37264mo. ADDENDUM 03/18/16>> Pt unable to afford inhalers- ADVAIR & SPIRIVA discontinued and placed on NEBULIZER w/ DUONEB QID & PULMICORT 0.25 BID regularly (we will update med list to reflect this change).  ~  May 05, 2016:  23m32moV & [name] notes DOE w/ exertion- he is in PulHealth Netusing up to 6L/min w/ exercise (he tried Oxy[employer] 1 Automotiveusing 3-4L/min at rest & Qhs; he feels that he is making some progress & I reminded him the DIET + EXERCISE and wt loss would be very beneficial to his breathing; states he is walking & mowing on his off days (ie- when not in rehab); despite everything his wt continues to climb- now up 4# to 235# "exercise makes me hungry"... we reviewed the following medical problems during today's office visit >> his new PCP is [name]...    COPD, ex-smoker, chr hypoxemic RF> on NEB w/ Duoneb Qid + Budes-Bid + HFA prn (Advair & Spiriva too $$); he finally quit smoking, symptoms as above, he is now in pulm rehab but having issues w/ low sats...    HBP> on MetopER-57m52mM&1PM), Amlod10, Lisin10, Lasix40Bid; BP=136/66 & he denies CP, palpit, SOB, edema...    CAD> on ASA81, Imdur30; followed by [name] & seen 04/2016> CAD, IWMI 1995, mult PCIs, last Myoview 2013 was low risk; too sedentary, edema decr w/ Lasix; 73 y20 brother died w/ AAA rupture...    Chol> on Simva20; FLP 12/16 showed TChol 138, TG 75, HDL 25, LDL 98; needs better diet & wt  reduction and better compliance.    DM> on diet alone; LABS 5/17 showed BS= 118, A1c= per PCP; we reviewed low carb diet, exercise program, and wt reduction...    Obese> wt = 235#, 5'6"Tall, BMI=38;  we reviewed diet & exercise...    GI- Gastritis, polyps> prev on Zantac; prev colon 2004 w/ hyperplastic polyp removed & f/u 8/14 by [name] w/ 3 polyps removed (5-7mm 50me, all were tubular adenomas), +divertics in sigmoid, f/u planned 5 yrs... NOTE> exam showed a small palp knot in mid-abd above the umbilicus ?etiology (I offered CT Abd but he declines & wants to discuss w/ his primary- [name]).    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     DJD> on OTC analgesics as needed...    Anxiety> on Alpraz0.5mg pr72m. EXAM shows Afeb, VSS, O2sat=92% on 3L/min [place];  Wt=231# (up 15# & he was 204-210# prev);  HEENT- neg, mallampati3;  Chest- decr BS bilat, otherw clear w/o w/r/r;  Heart- RR w/o m/r/g;  Abd- obese, soft;  Ext- neg w/o c/c/Andrew. IMP/PLAN>>  [name] is rec to continue current meds regularly, get on diet & get wt down , continue in PulmRehab, etc; we wrote for a ProairHFA rescue inhaler for prn use...   ~  August 05, 2016:  264mo ROV58moast visit we rec continuing same meds but stressed DIET, EXERCISE, wt reduction-- unfortunately he  has GAINED 8# up to 243# (BMI=39) in the interval;  He states that "I don't get hungry, I only eat one sandwich & sometimes a salad at night;  He says "they kicked me out" of the [facility] program due to low oxygens even w/ use of the Oxymizer;  He says he is exercising at home now;  He has established PCP w/ [name] & apparently he did labs which pt reports were "OK".    [name] c/o cough, chest congestion, yellow sput, ans a "sinus infection" w/ yellow drainage; notes incr SOB but denies CP/  f/c/s, edema;   EXAM shows Afeb, VSS, O2sat=92% on 3L/min [place];  Wt=231# (up 15# & he was 204-210# prev);  HEENT- neg, mallampati3;  Chest- decr BS bilat, otherw clear w/o w/r/r;  Heart- RR w/o m/r/g;  Abd- obese, soft;  Ext- neg w/o c/c/Andrew. IMP/PLAN>>  [name] describes an upper resp infection & COPD exac=> Rx w/ Levaquin, Pred77m-4d taper, + his NEBS w/ duoneb Qid & Budes Bid; he desperately needs to get the weight down...  ~  October 13, 2016:  [name] and says "I've been worse" & indicates that he really likes the BiPAP he was placed on & disch with after his last Hosp 11/10 - 09/29/16; records reviewed by me- Adm w/ cough, yellow sput, chills and incr SOB; CXR revealed COPD, incr markings & bibasilar atx; Sput/ blood cultures and resp viral panel were all neg; LABS showed WBC~13K, anemia w/ Hg~11, Chems- ok x elev BS (his PCP is [name]); BNP was 29; ABGs showed pH=7.38, pCO2=58, pO2=79 on [place] O2; note that Bicarb level ~30;  2DEcho showed EF 60-65%, Gr1DD, PAsys~757mg;  He was felt to has a COPD exac, Cor Pulmonale- treated w/ BiPAP (he was INTOL to CPAP), given IV Solumedrol (DC on Pred taper), IV Zithromax, NEB treatments and improved...    He now returns & notes that he likes the BiPAP, feels like it is really helping (resting better, sleeps thru the night, wakes refreshed, no daytime sleepiness, & naps 2/7 days now) & wants to continue the BiPAP; NOTE that we had rec a sleep study to him on mult occas but he has always refused!  He now agrees to a sleep study, hoping that it will lead to medicare approval for his home BiPAP => HE QUALIFIES FOR BiPAP BASED ON COPD DIAGNOSIS w/ ABG SHOWING pCO2>52 ON HIS NASAL CANNULA O2, he needs the SLEEP OXIMETRY STUDY confirming O2sat<88% for cumulative >77m21mwhile on his prescribed O2, and OSA/CPAP treatment have been considered & ruled out... He has a home O2conc & a POC which he uses 3-4L pulse dose w/ his home sats in the 90s at rest & down to 88% w/  exerc...     COPD, mixed type w/ emphysema>  He has GOLD Stage3 COPD & on O2 as noted, NEBS w/ DUONEB Qid & BUDESONIDE0.25Bid, plus ProairHFA prn when out & about; off the Pred Rx [facility]...    Ex-smoker>  He quit late in 2016...    Chronic resp failure w/ hypoxemia and hypercarbia>  On above meds + ASA, Metoprolol, Imdur, Lasix    Cor pulmonale, secondary pulm hypertension>  He is on O2 24/7 using 4L/min continuous at night & 4L/min pulse dose during the day...    CARDIAC issues>  HBP, CAD w/ IWMI 1995 & mult PCIs, HL (on Simva20)- all followed by [name]...    MEDICAL issues>  HBP,  HL, DM, obesity, gastritis, divertics, colon polyps, BPH, hx bladder cancer, DJD, anxiety EXAM shows Afeb, VSS, O2sat=97% on 4L/min [place];  Wt=240#;  HEENT- neg, mallampati3;  Chest- decr BS bilat, otherw clear w/o w/r/r;  Heart- RR w/o m/r/g;  Abd- obese, soft;  Ext- neg w/o c/c/Andrew.  CXR 09/26/16>  Norm heart size, incr bibasilar atx... IMP/PLAN>>  [name] needs to continue his O2, NEBS w/ Duoneb & Budes regularly; he wants to continue the BiPAP & we need to see if he meets Medicare's criteria for BiPAP device- proceed w/ Sleep Study...   ADDENDUM>>  Sleep Study 10/15/16>  AHI=1.8/hr (8 hypopneas & 4 RERAs;  AHI during REM = 17.6/hr;  Study done on 4LO2 & min O2sat= 88%;  Mod snoring noted;  No arrhythmias;  PLMS index= 37, but assoc arousals was only 0.2;  He appears to meet the criteria for BiPAP & we will submit to [facility]/ [employer]... Plan ROV recheck in 6-8 weeks.  ~  November 24, 2016:  6wks ROV and pulmonary follow up visit>  [name] is stable w/ his severe disease- on BiPAP Qhs, Home O2 24/7, NEB w/ Duoneb Qid & Budes Bid, he has rescue inhaler and [place] nasal mist;  His weight is all the way up to 257# (up 18# in 6wks) and c/o cough off & on, small amt yellow sput, no f/c/s, SOB "the same" but notes incr DOE w/ walking; unfortunately he has been very sedentary- not exercising at all  (prev in pulm rehab & they discharged him from the program)...     SEE PROBLEM LIST-- above... EXAM shows Afeb, VSS, O2sat=97% on 4L/min [place];  Wt=240#;  HEENT- neg, mallampati3;  Chest- decr BS bilat, otherw clear w/o w/r/r;  Heart- RR w/o m/r/g;  Abd- obese, soft;  Ext- neg w/o c/c/Andrew.  LABS 11/24/16>  Chems- ok w/ BS=117, Cr=0.64, LFTs=wnl;  CBC- anemic w/ Hg=10.7mv=95, wbc-norm;  Fe=191 (sat=56%), Ferritin=109;  TSH=1.28...  Abd SONAR => ordered but never done...  Last CT Chest 08/19/16> mild cardiomeg w/ aortic & coronary atherosclerotic changes & PA enlargement w/ 3.4cm outflow tract; no adenopathy; severe bullous emphysema; bilat pulm nodules w/ 7.272mRUL nodule & 7.9 LLL lesion; Abd showssmall gallstones vs sludge & 6cm right renal cyst, mod thoracic spondylosis...  Last 2DEcho 09/29/16> mod conc LVH, norm LVF w/ EF=60-65%, no regional wall motion abn, Gr1 DD, norm valves, RV dil w/ norm sys function, PAsys=74 mmHg...  Last ONO > finally found results=> ONO on Oxygen at 4L/min 11/09/16 by [facility] showed 7.5Hr study- time <88% was 42 min, lowest O2sat=82% IMP?PLAN>>  ErJaylandas severe COPD/emphysema w/ pulmHTN & CorPulmonale; he refuses to fill inhalers due to costs & he is therefore on O2- 24/7, BiPAP nightly, NEBS w/ Duoneb Qid & Budes Bid, Mucinex 1200Bid, & asked to get into a regular exercise program;  He will continue regular f/u w/ CARDS- [name]; must work on weight reduction...          Problem List:  Hx of SINUSITIS (ICD-473.9) - off Nasonex now & recent Rx w/ Augmentin/ Saline... notes occas sinus congestion, drainage, sl HA, etc... he knows that he needs to quit smoking! ~  10/10: Adm for epistaxis w/ eval by DrWolicki- CXR= NAD, labs OK, rec for saline lavage- nose bleed resolved & disch home (no recurrence so far). ~  1/16: he presented w/ acute max & frontal sinusitis> Rx w/ Augmentin, Align, Saline, Mucinex, etc... ~  3/17: he has acute bronchitic exac- treated w/ Levaquin  x7d +  his regular meds...  COPD (ICD-496) >>   ~  on ADVAIR 250Bid; STILL SMOKES CIGARETTES & he's refused Chantix...  ~  He had hemoptysis 6/99 without lesion on CXR, CTChest, or Bronchoscopy... ~  Adm 10/10 w/ epistaxsis & eval DrWolicki as noted> he wanted to do Sleep Study but pt cancelled it & declined to resched. 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 ~  CXR 5/12 showed ectatic calcif & tort Aorta, COPD w/ incr markings ar bases, NAD, +degen spondylosis. ~  CXR 6/13 showed COPD/ emphysema, scarring in the lower lobes, otherw clear lungs, DJD in spine... ~  CXR 7/14 showed COPD/ emphysema, incr markings and scarring at the bases, NAD. ~  1/15: [name] says he's decr his smoking from prev 3-4ppd down to 3 cig/d;  He remains on Advair250> states breathing is ok, at baseline CAT score=12; baseline cough, sput, no hemoptysis, stable DOE, no edema... ~  CXR 7/15 showed underlying COPD, mild bibasilar fibrosis, norm heart size, DJD in spine, NAD...  ~  PFT 7/15 showed> FVC=3.18 (78%), FEV1=1.22 (38%), %1sec=38; mid-flows=13% predicted... This is c/w GOLD Stage3 COPD and we discussed this> He is rec to quit all smoking immediately; he has been using the nicotine gum & rec to try patches, offered Wellbutrin, he refuses Chantix; needs to continue ADVAIR250Bid & samples givem; he declines to start [place] at this time due to cost & donut hole (we will address this again on return); reminded to use MUCINEX 682m- 1to2 Bid w/ fluids, and OK to use OTC DELSYM as needed for cough...   ~  1/16: he claims stable on Advair250 but still smoking & can't vs won't quit; we decided to add Spiriva daily via handihaler... ~  7/16: on Advair250Bid & Spiriva daily; symptoms as above, he is stoic, still smoking 1ppd & NOT motivated to quit, asked to cut back & use both inhalers regularly;  We reviewed the low-dose screening CT Chest program for the early detection of lung cvancer- he will consider this & let me know  his decision... ~  12[facility]16:  [facility], COPD exac, and found to have pulmHTN w/ 2DEcho showing PAsys=51;  Treated w/ O2, Pred taper, antibiotics, NEBS, Advair/Spiriva, etc;  He says he has quit smoking... ~  [facility] CXR- COPD, bullous dis, incr markings at bases, NAD; Cultures of blood/ sput/ urine were all neg; Serologies for strep & legionella were neg; Viral panel was neg; He was treated w/ Oxygen, IV Solumedrol, Rocephin/ Zithromax, NEBS, Lovenox, flutter, etc; He was disch on Home O2- 2L/min activ & 1L/min rest, Pred taper, NEBS, Levaquin, Advair250/ Spiriva => improved. 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   HE HAS ESTABLISHED PRIMARY [name] w/ [facility]burt [name]>>   HYPERTENSION (ICD-401.9) - controlled on METOPROLOL 279mid,  NORVASC 1053m, LISINOPRIL 23m53m..   ~  11/11:  BP=122/70, not checking BP's at home... denies HA, fatigue, visual changes, CP, palipit, dizziness, syncope, dyspnea, edema, etc. ~  5/12:  BP= 120/72, pt very pleased w/ his wt loss & improved exercise ability. ~  6/13:  BP= 122/64 & feeling well w/o CP, palpit, SOB, edema,  etc... ~  12/13:  BP=122/82 & he denies CP, palpit, SOB, edema... ~  7/14: on Metop25-3/d, Amlod10, Lisin40; BP=130/78 & he denies CP, palpit, SOB, edema ~  1/15: BP remains stable on Metop25-2AM & 1PM, Amlod5, Lisin40; BP= 120/72 and he denies CP, palpit, ch in SOB, edema. ~  2016> His meds have been adjusted- now on Rx w/ Amlod10, Lisin10; BP= 130/72 7 he denies CP, palpit, edema; chr SOB/DOE due to his COPD.  CORONARY ARTERY DISEASE (ICD-414.00) - ASA 88m/d & off Plavix as of 2/13 officially as he wasn't taking it  regularly anyway. ~  Hx prev IWMI and PTCA/ stent in RCA by DrBrodie 12/95 & 5/96...  ~  Myoview 4/08 abnormal w/ inferior ischemia and subseq cath revealing restenoses in RCA- s/p repeat PTCA/ stents...  ~  f/u Myoview 9/08 w/ prev infer infarct, no ischemia, EF=56%... ~  9/11:  pre op cardiac eval DrBrodie> OK to hold ASA/ Plavix for bladder surg. ~  2/12: f/u [name] w/ hx CAD, MI in 95, mult PCIs last 2009> stable, on ASA/ Plavix, no changes made; EKG= NSR, WNL... ~  2/13:  He had f/u [name] w/ Myoview showing a fixed defect in infer wall, no ischemia, EF=58%; pt wasn't taking Plavix, therefore stopped in favor of ASA84md... ~  3/14:  He had f/u [name]> CAD, IWMI 1995, mult PCIs, last Myoview 2013 was low risk; too sedentary, no changes made; 7315/o brother died recently w/ AAA rupture... ~  EKG 3/14 showed NSR, rate65, WNL, NAD... ~  He maintains f/u w/ CARDS, [name]- known CAD, Hx inferior MI 1995, s/p mult PCIs- most recent 2009; finally qit smoking 12/16 after [facility] w/ hypoxemia; on ASA, statin, Amlod & Lisin.  HYPERCHOLESTEROLEMIA (Mixed Hyperlipidemia) - prev on Simva80 but DrBrodie decr him to SIMVASTATIN 2081m + Fish Oil & Red Wine qd... ~  FLPBethune08 on Simva40 showed TChol 151, TG 106, HDL 23, LDL 107 ~  FLP 8/09 on Simva80 showed TChol 142, TG 135, HDL 28, LDL 87 ~  FLP 6/10 on Simva80 showed TChol 151, TG 117, HDL 31, LDL 97 ~  FLP in hosp 10/10 showed TChol 128, TG 110, HDL 26, LDL 80 ~  FLP 5/11 on Simva80 showed TChol 144, TG 215, HDL 26, LDL 89 ~  9/11:  DrBrodie decr him to Simva20... we discussed the need for better diet & wt reduction. ~  FLP 5/12 on Simva20 showed TChol 162, TG 159, HDL 32, LDL 98 ~  FLP 6/13 on Simva20 showed TChol 130, TG 102, HDL 33, LDL 77... rec to incr exercise program & get wt down! ~  FLP 7/14 on Simva20 showed TChol 158, TG 145, HDL 33, LDL 96... He has gained way too much wt 7 must get on diet! ~  Followed by PCP-  [name]...  DIABETES MELLITUS, BORDERLINE (ICD-790.29) - on diet alone... ~  labs 9/08 showed BS=100, HgA1c=6.1 ~  labs 8/09 showed BS= 107, A1c= 6.2 ~  labs 6/10 showed BS= 108, A1c= 6.2 ~  lab 5/11 (wt=244#) showed BS= 121. A1c= 6.7... Marland Kitchene was told "get wt down, or start meds". ~  Labs 5/12 showed BS= 109, A1c= 6.1 ~  Labs 6/13 showed BS= 114, & he needs to lose the weight... ~  Labs 7/14 on diet alone showed BS= 120, A1c= 6.7 ~  Followed by PCP- [name]...  OBESITY (ICD-278.00) - weight was down as low as 202# in 2/09, and steadily incr to 244#  by 5/11... ~  weight 5/11 = 244#.[name].  we reviewed diet + exercise necessary to lose weight... ~  weight 11/11 = 217# (after dx & rx for bladder cancer) ~  Weight 5/12 = 214# but says it's 192# at home... ~  Weight 6/13 = 211# but knows it's better since his pant/ belt is down to 36" (under his roll). ~  Weight 7/14 = 230# but he has quit smoking he says, now must lose the weight... ~  Weight 1/15 = 229# ~  Weight 1/17 = 216# ~  Weight 3/17 = 231# and we reviewed diet, exercise, wt reducing strategies...  GASTRITIS (ICD-535.50) - had EGD 10/05 showing gastritis... on RANITADINE 36m/d due to his Plavix Rx, off prev Omep.  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 ~  4/12:  Pt reported f/u cysto was neg, doing well... ~  10/12:  Note from [name] reviewed> cysto was neg, no recurrent tumors... ~  4/13:  Note from [name] reviewed> neg f/u cysto w/o recurrent TCCa... He wants to continue Q6mo82motoscopies... ~  1/14:  He had f/u w/ [name]> Hx TCCa bladder, s/p TURBT  9/11, given BCG 11/11-12/11 and f/u Cysto 1/12 was neg; repeat cysto 1/14 was neg as well...  DEGENERATIVE JOINT DISEASE (ICD-715.90) - He has c/o right shoulder pain, hip pain, knee pain, and LBP> Rx ADVIL w/ some benefit... offered Ortho referral & he will decide.  ANXIETY (ICD-300.00) - on ALPRAZOLAM 0.5mg 37m...   Past Surgical History:  Procedure Laterality Date  . cataract surgery  septemeber 2013  . cataract surgery/sub posterior vitrectomy in left eye  1996   Andrew. RankiZadie RhineTCA  1995,[phone] Encounter Prescriptions as of 11/24/2016  Medication Sig  . albuterol (PROVENTIL HFA;VENTOLIN HFA) 108 (90 BASE) MCG/ACT inhaler Inhale 2 puffs into the lungs 4 (four) times daily as needed for wheezing (for use when not at home / unable to use nebulizer).  . Albuterol Sulfate (PROAIR RESPICLICK) 108 (371Base) MCG/ACT AEPB Inhale 2 puffs into the lungs every 6 (six) hours as needed. (Patient taking differently: Inhale 2 puffs into the lungs every 6 (six) hours as needed (shortness of breath). )  . ALPRAZolam (XANAX) 0.5 MG tablet Take 0.5 mg by mouth 3 (three) times daily as needed for anxiety or sleep.  . aspMarland Kitchenrin 81 MG tablet Take 81 mg by mouth at bedtime.   . budesonide (PULMICORT) 0.25 MG/2ML nebulizer solution Take 2 mLs (0.25 mg total) by nebulization 2 (two) times daily.  . Flaxseed, Linseed, (FLAXSEED OIL) 1000 MG CAPS Take 1 capsule by mouth daily.    . furosemide (LASIX) 40 MG tablet Take 40 mg by mouth 2 (two) times daily.  . ibuMarland Kitchenrofen (ADVIL,MOTRIN) 600 MG tablet Take 600 mg by mouth every 6 (six) hours as needed for moderate pain.  . iprMarland Kitchentropium-albuterol (DUONEB) 0.5-2.5 (3) MG/3ML SOLN Take 3 mLs by nebulization 4 (four) times daily.  . isosorbide mononitrate (IMDUR) 30 MG 24 hr tablet Take 1 tablet (30 mg total) by mouth daily. (Patient taking differently: Take 30 mg by mouth 2 (two) times  daily. )  . metoprolol succinate (TOPROL-XL) 25 MG 24 hr tablet TAKE 2  TABLETS BY MOUTH IN THE MORNING AND 1 TABLET BY MOUTH IN THE THE EVENING  . Multiple Vitamin (MULTIVITAMIN) capsule Take 1 capsule by mouth daily.    . nitroGLYCERIN (NITROSTAT) 0.4 MG SL tablet Place 1 tablet (0.4 mg total) under the tongue every 5 (five) minutes as needed for chest pain.  . Omega-3 Fatty Acids (FISH OIL) 1200 MG CAPS Take 1 capsule by mouth daily.    . simvastatin (ZOCOR) 20 MG tablet Take 1 tablet (20 mg total) by mouth daily at 6 PM.  . sodium chloride (OCEAN) 0.65 % SOLN nasal spray Place 1 spray into both nostrils as needed for congestion.   No facility-administered encounter medications on file as of 11/24/2016.     Allergies  Allergen Reactions  . Amlodipine Swelling    Current Medications, Allergies, Past Medical History, Past Surgical History, Family History, and Social History were reviewed in [employer] record.    Review of Systems        See HPI - all other systems neg except as noted... The patient complains of some dyspnea on exertion.  The patient denies anorexia, fever, weight loss, weight gain, vision loss, decreased hearing, hoarseness, chest pain, syncope, peripheral edema, prolonged cough, headaches, hemoptysis, abdominal pain, melena, hematochezia, severe indigestion/heartburn, hematuria, incontinence, muscle weakness, suspicious skin lesions, transient blindness, difficulty walking, depression, unusual weight change, abnormal bleeding, enlarged lymph nodes, and angioedema.     Objective:   Physical Exam    WD, Obese, 71 y/o WM in NAD... Vital Signs:  Reviewed... GENERAL:  Alert & oriented; pleasant & cooperative... HEENT:  [place]/AT, EOM-wnl, PERRLA, EACs-clear, TMs-wnl, NOSE- sl congestion, THROAT-clear & wnl. NECK:  Supple w/ fairROM; no JVD; normal carotid impulses w/o bruits; no thyromegaly or nodules palpated; no lymphadenopathy. CHEST:  decr BS at bases bilat w/ few scat rhonchi, no wheezing/ rales/ or signs of  consolidation... HEART:  Regular Rhythm; without murmurs/ rubs/ or gallops detected... ABDOMEN:  Obese, soft, & nontender; sm knot palpated in mid abd above umbilicus; normal bowel sounds; no organomegaly detected. EXT: without deformities, mild arthritic changes; no varicose veins/ +venous insuffic/ no edema. NEURO:  CN's intact; no focal neuro deficits... DERM:  No lesions noted; no rash etc...  RADIOLOGY DATA:  Reviewed in the EPIC EMR & discussed w/ the patient...  LABORATORY DATA:  Reviewed in the EPIC EMR & discussed w/ the patient...   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  11/21/15>  referred for PulmRehab, contact [place] re- POC, he still needs Full PFTs & CT Chest... We plan ROV in 6 wks 02/02/16> We discussed continuing the NEBS Tid followed by Advair250Bid & Spiriva daily; add Mucinex 1244mBid & we will Rx w/ Levaquin500/d x7d for his bronchitic infection (see AVS);  He still needs a CT Chest but wants to wait for now;  [place] [facility] [place] and he [place] diff DME supplier- we will inquire, plan ROV 322mo/21/17>   The Advair & Spiriva  were too $$ so we switched to NEBS w/ Duoneb Qid & Budes Bid; added ProairHFA prn; he desperately needs to lose wt in conjunction w/ his PulmRehab etc... 08/05/16>   He presents w/ a COPD exac-- Rx w/ Levaquin, Pred20-4d taper + NEBS w/ duoneb Qid & budes Bid...   Primary [name] followed by [name]>> HBP>   CAD>            Followed by [name] & [name] LIPIDS>   DM>   GI> Hx Gastritis, Polyps>   GU>   Followed by [name]  Patient's  Medications  New Prescriptions   No medications on file  Previous Medications   ALBUTEROL (PROVENTIL HFA;VENTOLIN HFA) 108 (90 BASE) MCG/ACT INHALER    Inhale 2 puffs into the lungs 4 (four) times daily as needed for wheezing (for use when not at home / unable to use nebulizer).   ALBUTEROL SULFATE (PROAIR RESPICLICK) 885 (90 BASE) MCG/ACT AEPB    Inhale 2 puffs into the lungs every 6 (six) hours as needed.   ALPRAZOLAM (XANAX) 0.5 MG TABLET    Take 0.5 mg by mouth 3 (three) times daily as needed for anxiety or sleep.   ASPIRIN 81 MG TABLET    Take 81 mg by mouth at bedtime.    BUDESONIDE (PULMICORT) 0.25 MG/2ML NEBULIZER SOLUTION    Take 2 mLs (0.25 mg total) by nebulization 2 (two) times daily.   FLAXSEED, LINSEED, (FLAXSEED OIL) 1000 MG CAPS    Take 1 capsule by mouth daily.     FUROSEMIDE (LASIX) 40 MG TABLET    Take 40 mg by mouth 2 (two) times daily.   IBUPROFEN (ADVIL,MOTRIN) 600 MG TABLET    Take 600 mg by mouth every 6 (six) hours as needed for moderate pain.   IPRATROPIUM-ALBUTEROL (DUONEB) 0.5-2.5 (3) MG/3ML SOLN    Take 3 mLs by nebulization 4 (four) times daily.   ISOSORBIDE MONONITRATE (IMDUR) 30 MG 24 HR TABLET    Take 1 tablet (30 mg total) by mouth daily.   METOPROLOL SUCCINATE (TOPROL-XL) 25 MG 24 HR TABLET    TAKE 2 TABLETS BY MOUTH IN THE MORNING AND 1 TABLET BY MOUTH IN THE THE EVENING   MULTIPLE VITAMIN (MULTIVITAMIN) CAPSULE    Take 1 capsule by mouth daily.     NITROGLYCERIN (NITROSTAT) 0.4 MG SL TABLET    Place 1 tablet (0.4 mg total) under the tongue every 5 (five) minutes as needed for chest pain.   OMEGA-3 FATTY ACIDS (FISH OIL) 1200 MG CAPS    Take 1 capsule by mouth daily.     SIMVASTATIN (ZOCOR) 20 MG TABLET    Take 1 tablet (20 mg total) by mouth daily at 6 PM.   SODIUM CHLORIDE (OCEAN) 0.65 % SOLN NASAL SPRAY    Place 1 spray into both nostrils as needed for congestion.  Modified Medications   No medications on file  Discontinued Medications   No medications on  file  NOTE>> Advair & Spiriva discontinued due to cost & replaced by DUONEB-Qid & PULMICORT 0,85mBid via NEBULIZER..Marland KitchenMarland Kitchen

## 2016-11-25 ENCOUNTER — Other Ambulatory Visit: Payer: Self-pay

## 2016-11-25 NOTE — Patient Outreach (Signed)
Follow up call/case closure:  Late call to patient as I did not have access due to computer issue.  Patient reports that he is continue to have shortness of breath with exertion. States MD appointment went well yesterday.    Reports that he continues to use his BIPAP at night and otherwise can not breathe without it.   Reviewed concerns at this time; patient reports that he is doing well and denies any concerns.  States as long as he has his BIPAP he is managing well.  Reviewed case closure with patient and he is in agreement.  Will notify MD and will send patient case closure letter.   Tomasa Rand, RN, BSN, CEN [facility] [employer] [phone]

## 2016-12-03 ENCOUNTER — Ambulatory Visit (HOSPITAL_COMMUNITY): Payer: Medicare Other

## 2016-12-08 ENCOUNTER — Encounter: Payer: Self-pay | Admitting: *Deleted

## 2016-12-08 ENCOUNTER — Telehealth: Payer: Self-pay | Admitting: Pulmonary Disease

## 2016-12-08 NOTE — Telephone Encounter (Signed)
Spoke with pt, received a letter from [facility] stating that he needed to re-qualify for exertional 02 per medicare guidelines.   Pt scheduled for qualifying walk on 12/27/16.  Nothing further needed at this time.

## 2016-12-08 NOTE — Telephone Encounter (Signed)
This encounter was created in error - please disregard.

## 2016-12-27 ENCOUNTER — Ambulatory Visit: Payer: Medicare Other | Admitting: Pulmonary Disease

## 2016-12-27 DIAGNOSIS — J9622 Acute and chronic respiratory failure with hypercapnia: Principal | ICD-10-CM

## 2016-12-27 DIAGNOSIS — J9621 Acute and chronic respiratory failure with hypoxia: Secondary | ICD-10-CM

## 2016-12-27 DIAGNOSIS — J449 Chronic obstructive pulmonary disease, unspecified: Secondary | ICD-10-CM

## 2017-01-19 ENCOUNTER — Inpatient Hospital Stay (HOSPITAL_COMMUNITY)
Admission: EM | Admit: 2017-01-19 | Discharge: 2017-01-26 | DRG: 291 | Disposition: A | Discharge status: Home Health | Payer: Medicare Other | Attending: Internal Medicine | Admitting: Internal Medicine

## 2017-01-19 ENCOUNTER — Emergency Department (HOSPITAL_COMMUNITY): Payer: Medicare Other

## 2017-01-19 ENCOUNTER — Encounter (HOSPITAL_COMMUNITY): Payer: Self-pay | Admitting: Emergency Medicine

## 2017-01-19 DIAGNOSIS — Z809 Family history of malignant neoplasm, unspecified: Secondary | ICD-10-CM | POA: Diagnosis not present

## 2017-01-19 DIAGNOSIS — J9602 Acute respiratory failure with hypercapnia: Secondary | ICD-10-CM | POA: Diagnosis not present

## 2017-01-19 DIAGNOSIS — I252 Old myocardial infarction: Secondary | ICD-10-CM | POA: Diagnosis not present

## 2017-01-19 DIAGNOSIS — I700 Atherosclerosis of aorta: Secondary | ICD-10-CM | POA: Diagnosis present

## 2017-01-19 DIAGNOSIS — I5033 Acute on chronic diastolic (congestive) heart failure: Secondary | ICD-10-CM | POA: Diagnosis not present

## 2017-01-19 DIAGNOSIS — I420 Dilated cardiomyopathy: Secondary | ICD-10-CM | POA: Diagnosis present

## 2017-01-19 DIAGNOSIS — I10 Essential (primary) hypertension: Secondary | ICD-10-CM | POA: Diagnosis present

## 2017-01-19 DIAGNOSIS — J9621 Acute and chronic respiratory failure with hypoxia: Secondary | ICD-10-CM

## 2017-01-19 DIAGNOSIS — I509 Heart failure, unspecified: Secondary | ICD-10-CM | POA: Diagnosis not present

## 2017-01-19 DIAGNOSIS — J9600 Acute respiratory failure, unspecified whether with hypoxia or hypercapnia: Secondary | ICD-10-CM | POA: Diagnosis present

## 2017-01-19 DIAGNOSIS — J449 Chronic obstructive pulmonary disease, unspecified: Secondary | ICD-10-CM | POA: Diagnosis present

## 2017-01-19 DIAGNOSIS — R040 Epistaxis: Secondary | ICD-10-CM | POA: Diagnosis not present

## 2017-01-19 DIAGNOSIS — Z8249 Family history of ischemic heart disease and other diseases of the circulatory system: Secondary | ICD-10-CM | POA: Diagnosis not present

## 2017-01-19 DIAGNOSIS — J9601 Acute respiratory failure with hypoxia: Secondary | ICD-10-CM | POA: Diagnosis not present

## 2017-01-19 DIAGNOSIS — I272 Pulmonary hypertension, unspecified: Secondary | ICD-10-CM | POA: Diagnosis not present

## 2017-01-19 DIAGNOSIS — F419 Anxiety disorder, unspecified: Secondary | ICD-10-CM | POA: Diagnosis present

## 2017-01-19 DIAGNOSIS — D649 Anemia, unspecified: Secondary | ICD-10-CM | POA: Diagnosis present

## 2017-01-19 DIAGNOSIS — Z6838 Body mass index (BMI) 38.0-38.9, adult: Secondary | ICD-10-CM | POA: Diagnosis not present

## 2017-01-19 DIAGNOSIS — E662 Morbid (severe) obesity with alveolar hypoventilation: Secondary | ICD-10-CM | POA: Diagnosis not present

## 2017-01-19 DIAGNOSIS — I2510 Atherosclerotic heart disease of native coronary artery without angina pectoris: Secondary | ICD-10-CM | POA: Diagnosis not present

## 2017-01-19 DIAGNOSIS — E669 Obesity, unspecified: Secondary | ICD-10-CM | POA: Diagnosis present

## 2017-01-19 DIAGNOSIS — Z952 Presence of prosthetic heart valve: Secondary | ICD-10-CM | POA: Diagnosis not present

## 2017-01-19 DIAGNOSIS — Z6836 Body mass index (BMI) 36.0-36.9, adult: Secondary | ICD-10-CM | POA: Diagnosis not present

## 2017-01-19 DIAGNOSIS — J441 Chronic obstructive pulmonary disease with (acute) exacerbation: Secondary | ICD-10-CM

## 2017-01-19 DIAGNOSIS — I110 Hypertensive heart disease with heart failure: Principal | ICD-10-CM | POA: Diagnosis present

## 2017-01-19 DIAGNOSIS — Z888 Allergy status to other drugs, medicaments and biological substances status: Secondary | ICD-10-CM

## 2017-01-19 DIAGNOSIS — Z9981 Dependence on supplemental oxygen: Secondary | ICD-10-CM

## 2017-01-19 DIAGNOSIS — Z87891 Personal history of nicotine dependence: Secondary | ICD-10-CM

## 2017-01-19 DIAGNOSIS — Z8551 Personal history of malignant neoplasm of bladder: Secondary | ICD-10-CM | POA: Diagnosis not present

## 2017-01-19 DIAGNOSIS — I2729 Other secondary pulmonary hypertension: Secondary | ICD-10-CM | POA: Diagnosis present

## 2017-01-19 DIAGNOSIS — E7800 Pure hypercholesterolemia, unspecified: Secondary | ICD-10-CM | POA: Diagnosis present

## 2017-01-19 DIAGNOSIS — Z7951 Long term (current) use of inhaled steroids: Secondary | ICD-10-CM

## 2017-01-19 DIAGNOSIS — Z79899 Other long term (current) drug therapy: Secondary | ICD-10-CM

## 2017-01-19 DIAGNOSIS — G4733 Obstructive sleep apnea (adult) (pediatric): Secondary | ICD-10-CM | POA: Diagnosis present

## 2017-01-19 DIAGNOSIS — R0602 Shortness of breath: Secondary | ICD-10-CM | POA: Diagnosis not present

## 2017-01-19 DIAGNOSIS — J9622 Acute and chronic respiratory failure with hypercapnia: Secondary | ICD-10-CM | POA: Diagnosis not present

## 2017-01-19 DIAGNOSIS — Z7982 Long term (current) use of aspirin: Secondary | ICD-10-CM

## 2017-01-19 DIAGNOSIS — Z86010 Personal history of colonic polyps: Secondary | ICD-10-CM | POA: Diagnosis not present

## 2017-01-19 LAB — URINALYSIS, ROUTINE W REFLEX MICROSCOPIC
Bacteria, UA: NONE SEEN
Bilirubin Urine: NEGATIVE
GLUCOSE, UA: NEGATIVE mg/dL
HGB URINE DIPSTICK: NEGATIVE
Ketones, ur: 5 mg/dL — AB
Leukocytes, UA: NEGATIVE
NITRITE: NEGATIVE
PH: 5 (ref 5.0–8.0)
PROTEIN: 30 mg/dL — AB
Specific Gravity, Urine: 1.023 (ref 1.005–1.030)

## 2017-01-19 LAB — CBC WITH DIFFERENTIAL/PLATELET
BASOS ABS: 0 10*3/uL (ref 0.0–0.1)
Basophils Relative: 0 %
Eosinophils Absolute: 0.2 10*3/uL (ref 0.0–0.7)
Eosinophils Relative: 4 %
HEMATOCRIT: 32.8 % — AB (ref 39.0–52.0)
Hemoglobin: 10.1 g/dL — ABNORMAL LOW (ref 13.0–17.0)
LYMPHS PCT: 20 %
Lymphs Abs: 1.2 10*3/uL (ref 0.7–4.0)
MCH: 30.6 pg (ref 26.0–34.0)
MCHC: 30.8 g/dL (ref 30.0–36.0)
MCV: 99.4 fL (ref 78.0–100.0)
Monocytes Absolute: 0.3 10*3/uL (ref 0.1–1.0)
Monocytes Relative: 5 %
NEUTROS ABS: 4.1 10*3/uL (ref 1.7–7.7)
NEUTROS PCT: 71 %
PLATELETS: 158 10*3/uL (ref 150–400)
RBC: 3.3 MIL/uL — AB (ref 4.22–5.81)
RDW: 18.2 % — ABNORMAL HIGH (ref 11.5–15.5)
WBC: 5.8 10*3/uL (ref 4.0–10.5)

## 2017-01-19 LAB — COMPREHENSIVE METABOLIC PANEL
ALBUMIN: 3.8 g/dL (ref 3.5–5.0)
ALT: 26 U/L (ref 17–63)
ANION GAP: 7 (ref 5–15)
AST: 29 U/L (ref 15–41)
Alkaline Phosphatase: 39 U/L (ref 38–126)
BILIRUBIN TOTAL: 1.8 mg/dL — AB (ref 0.3–1.2)
BUN: 13 mg/dL (ref 6–20)
CO2: 28 mmol/L (ref 22–32)
Calcium: 9 mg/dL (ref 8.9–10.3)
Chloride: 105 mmol/L (ref 101–111)
Creatinine, Ser: 0.78 mg/dL (ref 0.61–1.24)
Glucose, Bld: 129 mg/dL — ABNORMAL HIGH (ref 65–99)
POTASSIUM: 3.6 mmol/L (ref 3.5–5.1)
Sodium: 140 mmol/L (ref 135–145)
TOTAL PROTEIN: 6.5 g/dL (ref 6.5–8.1)

## 2017-01-19 LAB — TROPONIN I: Troponin I: <0.03 ng/mL (ref <0.03)

## 2017-01-19 LAB — I-STAT CG4 LACTIC ACID, ED: Lactic Acid, Venous: 1.15 mmol/L (ref 0.5–1.9)

## 2017-01-19 LAB — BRAIN NATRIURETIC PEPTIDE: B NATRIURETIC PEPTIDE 5: 35.2 pg/mL (ref 0.0–100.0)

## 2017-01-19 MED ORDER — ENOXAPARIN SODIUM 40 MG/0.4ML ~~LOC~~ SOLN
40.0000 mg | SUBCUTANEOUS | Status: DC
  Administered 2017-01-20 – 2017-01-26 (×7): 40 mg via SUBCUTANEOUS
  Filled 2017-01-19 (×8): qty 0.4

## 2017-01-19 MED ORDER — OMEGA-3-ACID ETHYL ESTERS 1 G PO CAPS
1.0000 g | ORAL_CAPSULE | Freq: Every day | ORAL | Status: DC
  Administered 2017-01-20 – 2017-01-26 (×7): 1 g via ORAL
  Filled 2017-01-19 (×8): qty 1

## 2017-01-19 MED ORDER — MULTIVITAMINS PO CAPS: 1.0000 | ORAL_CAPSULE | Freq: Every day | ORAL | Status: DC

## 2017-01-19 MED ORDER — POTASSIUM CHLORIDE CRYS ER 20 MEQ PO TBCR
40.0000 meq | EXTENDED_RELEASE_TABLET | Freq: Two times a day (BID) | ORAL | Status: DC
  Administered 2017-01-19 – 2017-01-21 (×4): 40 meq via ORAL
  Filled 2017-01-19 (×4): qty 2

## 2017-01-19 MED ORDER — FUROSEMIDE 10 MG/ML IJ SOLN
60.0000 mg | Freq: Once | INTRAMUSCULAR | Status: AC
  Administered 2017-01-19: 60 mg via INTRAVENOUS
  Filled 2017-01-19: qty 6

## 2017-01-19 MED ORDER — ALPRAZOLAM 0.5 MG PO TABS
0.5000 mg | ORAL_TABLET | Freq: Three times a day (TID) | ORAL | Status: DC | PRN
  Administered 2017-01-20 – 2017-01-25 (×6): 0.5 mg via ORAL
  Filled 2017-01-19 (×6): qty 1

## 2017-01-19 MED ORDER — IBUPROFEN 200 MG PO TABS: 600.0000 mg | ORAL_TABLET | Freq: Four times a day (QID) | ORAL | Status: DC | PRN

## 2017-01-19 MED ORDER — SODIUM CHLORIDE 0.9% FLUSH: 3.0000 mL | INTRAVENOUS | Status: DC | PRN

## 2017-01-19 MED ORDER — ADULT MULTIVITAMIN W/MINERALS CH
1.0000 | ORAL_TABLET | Freq: Every day | ORAL | Status: DC
  Administered 2017-01-20 – 2017-01-26 (×7): 1 via ORAL
  Filled 2017-01-19 (×7): qty 1

## 2017-01-19 MED ORDER — BUDESONIDE 0.25 MG/2ML IN SUSP
0.2500 mg | Freq: Two times a day (BID) | RESPIRATORY_TRACT | Status: DC
  Administered 2017-01-20 – 2017-01-26 (×13): 0.25 mg via RESPIRATORY_TRACT
  Filled 2017-01-19 (×15): qty 2

## 2017-01-19 MED ORDER — SODIUM CHLORIDE 0.9% FLUSH
3.0000 mL | Freq: Two times a day (BID) | INTRAVENOUS | Status: DC
  Administered 2017-01-20 – 2017-01-25 (×12): 3 mL via INTRAVENOUS

## 2017-01-19 MED ORDER — SODIUM CHLORIDE 0.9 % IV SOLN: 250.0000 mL | INTRAVENOUS | Status: DC | PRN

## 2017-01-19 MED ORDER — FLAXSEED OIL 1000 MG PO CAPS: 1.0000 | ORAL_CAPSULE | Freq: Every day | ORAL | Status: DC

## 2017-01-19 MED ORDER — ISOSORBIDE MONONITRATE ER 30 MG PO TB24
30.0000 mg | ORAL_TABLET | Freq: Two times a day (BID) | ORAL | Status: DC
  Administered 2017-01-19 – 2017-01-26 (×14): 30 mg via ORAL
  Filled 2017-01-19 (×14): qty 1

## 2017-01-19 MED ORDER — IRBESARTAN 150 MG PO TABS
75.0000 mg | ORAL_TABLET | Freq: Every day | ORAL | Status: DC
  Administered 2017-01-20 – 2017-01-26 (×7): 75 mg via ORAL
  Filled 2017-01-19 (×8): qty 1

## 2017-01-19 MED ORDER — SIMVASTATIN 20 MG PO TABS
20.0000 mg | ORAL_TABLET | Freq: Every day | ORAL | Status: DC
  Administered 2017-01-20 – 2017-01-25 (×6): 20 mg via ORAL
  Filled 2017-01-19 (×6): qty 1

## 2017-01-19 MED ORDER — FUROSEMIDE 10 MG/ML IJ SOLN
80.0000 mg | Freq: Four times a day (QID) | INTRAMUSCULAR | Status: DC
  Administered 2017-01-20 – 2017-01-21 (×5): 80 mg via INTRAVENOUS
  Filled 2017-01-19 (×6): qty 8

## 2017-01-19 MED ORDER — ALBUTEROL (5 MG/ML) CONTINUOUS INHALATION SOLN
10.0000 mg/h | INHALATION_SOLUTION | Freq: Once | RESPIRATORY_TRACT | Status: AC
  Administered 2017-01-19: 10 mg/h via RESPIRATORY_TRACT
  Filled 2017-01-19: qty 20

## 2017-01-19 MED ORDER — ALBUTEROL SULFATE (2.5 MG/3ML) 0.083% IN NEBU: 2.5000 mg | INHALATION_SOLUTION | RESPIRATORY_TRACT | Status: DC | PRN

## 2017-01-19 MED ORDER — ASPIRIN 81 MG PO CHEW
81.0000 mg | CHEWABLE_TABLET | Freq: Every day | ORAL | Status: DC
  Administered 2017-01-19 – 2017-01-25 (×7): 81 mg via ORAL
  Filled 2017-01-19 (×7): qty 1

## 2017-01-19 MED ORDER — ONDANSETRON HCL 4 MG/2ML IJ SOLN
4.0000 mg | Freq: Four times a day (QID) | INTRAMUSCULAR | Status: DC | PRN
  Administered 2017-01-20: 4 mg via INTRAVENOUS
  Filled 2017-01-19: qty 2

## 2017-01-19 MED ORDER — SALINE SPRAY 0.65 % NA SOLN
1.0000 | NASAL | Status: DC | PRN
  Administered 2017-01-22: 1 via NASAL
  Filled 2017-01-19: qty 44

## 2017-01-19 MED ORDER — IPRATROPIUM-ALBUTEROL 0.5-2.5 (3) MG/3ML IN SOLN
3.0000 mL | Freq: Four times a day (QID) | RESPIRATORY_TRACT | Status: DC
  Administered 2017-01-19 – 2017-01-26 (×27): 3 mL via RESPIRATORY_TRACT
  Filled 2017-01-19 (×27): qty 3

## 2017-01-19 MED ORDER — ACETAMINOPHEN 325 MG PO TABS: 650.0000 mg | ORAL_TABLET | ORAL | Status: DC | PRN

## 2017-01-19 NOTE — ED Provider Notes (Signed)
[facility] DEPT Provider Note   CSN: [id number] Arrival date & time: 01/19/17  1517     History   Chief Complaint Chief Complaint  Patient presents with  . Shortness of Breath    HPI [name] is a 71 y.o. male.  Pt presents to the ED today with sob.  The pt said he's had a cough for about a month.  Today, he had a doctor's appointment, and on his way there, he developed sob.  The SOB became worse and EMS was called. O2 sats initially in the 60s.  It did improve with nebs.  He also received 125 mg of solumedrol IV.  He denies f/c.      Past Medical History:  Diagnosis Date  . Anxiety   . Bladder cancer ([place]) 2011  . Borderline diabetes mellitus   . CAD (coronary artery disease)   . Cigarette smoker   . COPD (chronic obstructive pulmonary disease) ([place])   . DJD (degenerative joint disease)   . Emphysema of lung ([place])   . Epistaxis   . History of colonic polyps 2004   hyperplastic   . History of sinusitis   . Hypercholesteremia   . Hypertension   . Obesity     Patient Active Problem List   Diagnosis Date Noted  . Abdominal distention 11/24/2016  . Chronic respiratory failure with hypoxia and hypercapnia ([place]) 10/13/2016  . OSA treated with BiPAP 10/13/2016  . Acute on chronic congestive heart failure ([place])   . SOB (shortness of breath)   . Macrocytic anemia 09/24/2016  . COPD with acute exacerbation ([place]) 09/24/2016  . Acute bronchitis 02/03/2016  . Ex-smoker 10/31/2015  . Dilated cardiomyopathy ([place])   . Pulmonary hypertension   . Acute on chronic respiratory failure with hypoxia and hypercapnia ([place]) 10/20/2015  . Nicotine abuse 10/20/2015  . 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 LBP (low back pain) 03/24/2011  . CARCINOMA, BLADDER, TRANSITIONAL CELL 09/22/2010  . HEMATURIA UNSPECIFIED 05/14/2010  . DEGENERATIVE JOINT DISEASE 03/28/2010  . CIGARETTE SMOKER 06/20/2008  . Coronary atherosclerosis of native coronary artery 12/19/2007  .  Acute sinusitis 12/19/2007  . COPD mixed type ([place]) 12/19/2007  . COLONIC POLYPS 12/18/2007  . HYPERCHOLESTEROLEMIA 12/18/2007  . Obesity 12/18/2007  . Anxiety 12/18/2007  . Essential hypertension 12/18/2007  . GASTRITIS 12/18/2007  . Impaired fasting glucose 12/18/2007    Past Surgical History:  Procedure Laterality Date  . cataract surgery  septemeber 2013  . cataract surgery/sub posterior vitrectomy in left eye  1996   Dr. Zadie Rhine  . PTCA  [phone]       Home Medications    Prior to Admission medications   Medication Sig Start Date End Date Taking? Authorizing Provider  albuterol (PROVENTIL HFA;VENTOLIN HFA) 108 (90 BASE) MCG/ACT inhaler Inhale 2 puffs into the lungs 4 (four) times daily as needed for wheezing (for use when not at home / unable to use nebulizer). 10/29/15   Cherene Altes, MD  Albuterol Sulfate (PROAIR RESPICLICK) 956 (90 Base) MCG/ACT AEPB Inhale 2 puffs into the lungs every 6 (six) hours as needed. Patient taking differently: Inhale 2 puffs into the lungs every 6 (six) hours as needed (shortness of breath).  05/05/16   Noralee Space, MD  ALPRAZolam Duanne [place]on) 0.5 MG tablet Take 0.5 mg by mouth 3 (three) times daily as needed for anxiety or sleep.    Historical Provider, MD  aspirin 81 MG tablet Take 81 mg by mouth at bedtime.  Historical Provider, MD  budesonide (PULMICORT) 0.25 MG/2ML nebulizer solution Take 2 mLs (0.25 mg total) by nebulization 2 (two) times daily. 03/16/16   Noralee Space, MD  Flaxseed, Linseed, (FLAXSEED OIL) 1000 MG CAPS Take 1 capsule by mouth daily.      Historical Provider, MD  furosemide (LASIX) 40 MG tablet Take 40 mg by mouth 2 (two) times daily.    Historical Provider, MD  ibuprofen (ADVIL,MOTRIN) 600 MG tablet Take 600 mg by mouth every 6 (six) hours as needed for moderate pain.    Historical Provider, MD  ipratropium-albuterol (DUONEB) 0.5-2.5 (3) MG/3ML SOLN Take 3 mLs by nebulization 4 (four) times daily. 03/16/16   Noralee Space, MD  isosorbide mononitrate (IMDUR) 30 MG 24 hr tablet Take 1 tablet (30 mg total) by mouth daily. Patient taking differently: Take 30 mg by mouth 2 (two) times daily.  03/16/16   Liliane Shi, PA-C  metoprolol succinate (TOPROL-XL) 25 MG 24 hr tablet TAKE 2 TABLETS BY MOUTH IN THE MORNING AND 1 TABLET BY MOUTH IN THE THE EVENING    Historical Provider, MD  Multiple Vitamin (MULTIVITAMIN) capsule Take 1 capsule by mouth daily.      Historical Provider, MD  nitroGLYCERIN (NITROSTAT) 0.4 MG SL tablet Place 1 tablet (0.4 mg total) under the tongue every 5 (five) minutes as needed for chest pain. 11/19/15   Liliane Shi, PA-C  Omega-3 Fatty Acids (FISH OIL) 1200 MG CAPS Take 1 capsule by mouth daily.      Historical Provider, MD  simvastatin (ZOCOR) 20 MG tablet Take 1 tablet (20 mg total) by mouth daily at 6 PM. 11/19/15   Liliane Shi, PA-C  sodium chloride (OCEAN) 0.65 % SOLN nasal spray Place 1 spray into both nostrils as needed for congestion. 10/29/15   Cherene Altes, MD    Family History Family History  Problem Relation Age of Onset  . Heart disease Mother   . Heart disease Brother   . Cancer Brother   . Aneurysm Brother     Social History Social History  Substance Use Topics  . Smoking status: Former Smoker    Packs/day: 2.00    Years: 45.00    Types: Cigarettes    Quit date: 10/17/2015  . Smokeless tobacco: Never Used  . Alcohol use 0.0 oz/week     Comment: glass of wine at night     Allergies   Amlodipine   Review of Systems Review of Systems  Respiratory: Positive for cough, shortness of breath and wheezing.   All other systems reviewed and are negative.    Physical Exam Updated Vital Signs BP 139/72 (BP Location: Left Arm)   Pulse 92   SpO2 97%   Physical Exam  Constitutional: He is oriented to person, place, and time. He appears well-developed. He appears distressed.  HENT:  Head: Normocephalic and atraumatic.  Right Ear: External ear normal.    Left Ear: External ear normal.  Nose: Nose normal.  Mouth/Throat: Oropharynx is clear and moist.  Eyes: Conjunctivae and EOM are normal. Pupils are equal, round, and reactive to light.  Neck: Normal range of motion. Neck supple.  Cardiovascular: Normal rate, regular rhythm, normal heart sounds and intact distal pulses.   Pulmonary/Chest: He is in respiratory distress. He has wheezes.  Abdominal: Soft. Bowel sounds are normal.  Musculoskeletal: Normal range of motion.  Neurological: He is alert and oriented to person, place, and time.  Skin: Skin is warm.  Psychiatric:  He has a normal mood and affect. His behavior is normal. Judgment and thought content normal.  Nursing note and vitals reviewed.    ED Treatments / Results  Labs (all labs ordered are listed, but only abnormal results are displayed) Labs Reviewed  COMPREHENSIVE METABOLIC PANEL - Abnormal; Notable for the following:       Result Value   Glucose, Bld 129 (*)    Total Bilirubin 1.8 (*)    All other components within normal limits  CBC WITH DIFFERENTIAL/PLATELET - Abnormal; Notable for the following:    RBC 3.30 (*)    Hemoglobin 10.1 (*)    HCT 32.8 (*)    RDW 18.2 (*)    All other components within normal limits  TROPONIN I  BRAIN NATRIURETIC PEPTIDE  URINALYSIS, ROUTINE W REFLEX MICROSCOPIC  I-STAT CG4 LACTIC ACID, ED    EKG  EKG Interpretation  Date/Time:  Wednesday January 19 2017 15:28:19 EST Ventricular Rate:  92 PR Interval:    QRS Duration: 95 QT Interval:  378 QTC Calculation: 468 R Axis:   57 Text Interpretation:  Sinus rhythm Confirmed by Gilford Raid MD, Katyra Tomassetti ([id number]) on 01/19/2017 3:32:54 PM       Radiology Dg Chest Port 1 View  Result Date: 01/19/2017 CLINICAL DATA:  71 year old male with shortness of breath and hypoxia EXAM: PORTABLE CHEST 1 VIEW COMPARISON:  Prior chest x-ray 09/26/2016 FINDINGS: Interval development of diffuse interstitial prominence in a predominantly bilateral lower lobe  distribution. Cardiac and mediastinal contours remain unchanged. Atherosclerotic calcifications are present in the transverse aorta. The upper lungs are hyperlucent suggesting emphysema. No acute osseous abnormality. IMPRESSION: 1. Interval development of diffuse interstitial prominence in the bilateral lower lungs. Differential considerations include pulmonary edema, and an atypical or viral respiratory infection. Acute pneumonitis is also possible but less likely. 2. Advanced upper lung predominant emphysema. Emphysema. (ICD10-J43.9) 3.  Aortic Atherosclerosis (ICD10-170.0) Electronically Signed   By: Jacqulynn Cadet M.D.   On: 01/19/2017 15:38    Procedures Procedures (including critical care time)  Medications Ordered in ED Medications  furosemide (LASIX) injection 60 mg (not administered)  albuterol (PROVENTIL,VENTOLIN) solution continuous neb (10 mg/hr Nebulization Given 01/19/17 1535)     Initial Impression / Assessment and Plan / ED Course  I have reviewed the triage vital signs and the nursing notes.  Pertinent labs & imaging results that were available during my care of the patient were reviewed by me and considered in my medical decision making (see chart for details).    Pt not much improved after a continuous neb.  The pt's O2 sats dropped into the 70s on 6L [place] when he got up to urinate in the urinal.  Pt given lasix also.  The pt placed on bipap and d/w Dr. Rockne Menghini (hospitalists) who will admit.  CRITICAL CARE Performed by: Isla Pence   Total critical care time: 30 minutes  Critical care time was exclusive of separately billable procedures and treating other patients.  Critical care was necessary to treat or prevent imminent or life-threatening deterioration.  Critical care was time spent personally by me on the following activities: development of treatment plan with patient and/or surrogate as well as nursing, discussions with consultants, evaluation of patient's  response to treatment, examination of patient, obtaining history from patient or surrogate, ordering and performing treatments and interventions, ordering and review of laboratory studies, ordering and review of radiographic studies, pulse oximetry and re-evaluation of patient's condition.  Final Clinical Impressions(s) / ED Diagnoses  Final diagnoses:  COPD exacerbation ([place])  Acute congestive heart failure, unspecified congestive heart failure type ([place])  Acute on chronic respiratory failure with hypoxia ([facility])  Chronic anemia    New Prescriptions New Prescriptions   No medications on file     Isla Pence, MD 01/19/17 1751

## 2017-01-19 NOTE — ED Triage Notes (Signed)
Pt here from MD office , pt with c/o sob , pt sats in the 60"by ems , pt is on home O2 at 6 liters n/c , pt  received albuterol and Atrovent 125 solumedrol

## 2017-01-19 NOTE — ED Notes (Signed)
Patient's wife escorted back to patient's room.

## 2017-01-19 NOTE — H&P (Signed)
History and Physical:    [name]   YBO:[id number] DOB: Oct 14, 1946 DOA: 01/19/2017  Referring MD/provider: Isla Pence, MD PCP: [name], MD   Patient coming from: MD office/home  Chief Complaint: Shortness of breath  History of Present Illness:   [name] is an 71 y.o. male with a PMH of chronic respiratory failure on 6 L of oxygen, and nocturnal BiPAP, COPD, cardiogenic pulmonary edema, chronic diastolic CHF, history of nonsustained ventricular tachycardia, and coronary artery disease who presents for evaluation of a 24-hour history of worsening dyspnea. Patient reports that he has had a cough for at least one month, and was going to his primary care doctor's office when he developed an acute worsening of his shortness of breath. EMS was called and his oxygen saturations were noted to be in the 60s. He was given bronchodilators and 125 mg of Solu-Medrol. Patient tells me that he does not feel this is related to his heart, and that he has had a heart attack in the past and his symptoms are not similar. No aggravating or alleviating factors.  ED Course:  The patient was found to be hypoxic with an elevated BNP. Chest x-ray showed findings consistent with acute pulmonary edema versus pneumonitis. Advanced upper lung predominant emphysema also noted as well as aortic atherosclerosis. He was given a dose of IV Lasix and referred for admission.  ROS:   Review of Systems  Constitutional: Positive for malaise/fatigue. Negative for chills, fever and weight loss.  HENT: Negative.   Eyes: Negative.   Respiratory: Positive for shortness of breath. Negative for cough, hemoptysis and sputum production.   Cardiovascular: Positive for leg swelling. Negative for chest pain, palpitations and orthopnea.  Gastrointestinal: Negative.   Genitourinary: Negative.   Musculoskeletal: Negative.   Skin: Negative.   Neurological: Positive for weakness.  Endo/Heme/Allergies: Negative.    Psychiatric/Behavioral: Negative.     Past Medical History:   Past Medical History:  Diagnosis Date  . Anxiety   . Bladder cancer ([facility]) 2011  . Borderline diabetes mellitus   . CAD (coronary artery disease)   . Cigarette smoker   . COPD (chronic obstructive pulmonary disease) ([place])   . DJD (degenerative joint disease)   . Emphysema of lung ([place])   . Epistaxis   . History of colonic polyps 2004   hyperplastic   . History of sinusitis   . Hypercholesteremia   . Hypertension   . Obesity     Past Surgical History:   Past Surgical History:  Procedure Laterality Date  . cataract surgery  septemeber 2013  . cataract surgery/sub posterior vitrectomy in left eye  1996   Dr. Zadie Rhine  . [facility]  [phone]    Social History:   Social History   Social History  . Marital status: Married    Spouse name: [name]  . Number of children: 2  . Years of education: N/A   Occupational History  . Retired    Social History Main Topics  . Smoking status: Former Smoker    Packs/day: 2.00    Years: 45.00    Types: Cigarettes    Quit date: 10/17/2015  . Smokeless tobacco: Never Used  . Alcohol use 0.0 oz/week     Comment: glass of wine at night  . Drug use: No  . Sexual activity: Not on file   Other Topics Concern  . Not on file   Social History Narrative  . No narrative on file  Allergies   Amlodipine  Family history:   Family History  Problem Relation Age of Onset  . Heart disease Mother   . Heart disease Brother   . Cancer Brother   . Aneurysm Brother     Current Medications:   Prior to Admission medications   Medication Sig Start Date End Date Taking? Authorizing Provider  albuterol (PROVENTIL HFA;VENTOLIN HFA) 108 (90 BASE) MCG/ACT inhaler Inhale 2 puffs into the lungs 4 (four) times daily as needed for wheezing (for use when not at home / unable to use nebulizer). 10/29/15   Cherene Altes, MD  Albuterol Sulfate (PROAIR RESPICLICK) 956 (90 Base)  MCG/ACT AEPB Inhale 2 puffs into the lungs every 6 (six) hours as needed. Patient taking differently: Inhale 2 puffs into the lungs every 6 (six) hours as needed (shortness of breath).  05/05/16   Noralee Space, MD  ALPRAZolam Duanne Moron) 0.5 MG tablet Take 0.5 mg by mouth 3 (three) times daily as needed for anxiety or sleep.    Historical Provider, MD  aspirin 81 MG tablet Take 81 mg by mouth at bedtime.     Historical Provider, MD  budesonide (PULMICORT) 0.25 MG/2ML nebulizer solution Take 2 mLs (0.25 mg total) by nebulization 2 (two) times daily. 03/16/16   Noralee Space, MD  Flaxseed, Linseed, (FLAXSEED OIL) 1000 MG CAPS Take 1 capsule by mouth daily.      Historical Provider, MD  furosemide (LASIX) 40 MG tablet Take 40 mg by mouth 2 (two) times daily.    Historical Provider, MD  ibuprofen (ADVIL,MOTRIN) 600 MG tablet Take 600 mg by mouth every 6 (six) hours as needed for moderate pain.    Historical Provider, MD  ipratropium-albuterol (DUONEB) 0.5-2.5 (3) MG/3ML SOLN Take 3 mLs by nebulization 4 (four) times daily. 03/16/16   Noralee Space, MD  isosorbide mononitrate (IMDUR) 30 MG 24 hr tablet Take 1 tablet (30 mg total) by mouth daily. Patient taking differently: Take 30 mg by mouth 2 (two) times daily.  03/16/16   Liliane Shi, PA-C  metoprolol succinate (TOPROL-XL) 25 MG 24 hr tablet TAKE 2 TABLETS BY MOUTH IN THE MORNING AND 1 TABLET BY MOUTH IN THE THE EVENING    Historical Provider, MD  Multiple Vitamin (MULTIVITAMIN) capsule Take 1 capsule by mouth daily.      Historical Provider, MD  nitroGLYCERIN (NITROSTAT) 0.4 MG SL tablet Place 1 tablet (0.4 mg total) under the tongue every 5 (five) minutes as needed for chest pain. 11/19/15   Liliane Shi, PA-C  Omega-3 Fatty Acids (FISH OIL) 1200 MG CAPS Take 1 capsule by mouth daily.      Historical Provider, MD  simvastatin (ZOCOR) 20 MG tablet Take 1 tablet (20 mg total) by mouth daily at 6 PM. 11/19/15   Liliane Shi, PA-C  sodium chloride (OCEAN) 0.65  % SOLN nasal spray Place 1 spray into both nostrils as needed for congestion. 10/29/15   Cherene Altes, MD    Physical Exam:   Vitals:   01/19/17 1800 01/19/17 1830 01/19/17 1900 01/19/17 1930  BP: 144/83 128/64 133/66 127/60  Pulse: 96 95 97 94  Resp: '23 17 25 17  '$ SpO2: 97% 97% 95% 97%     Physical Exam: Blood pressure 127/60, pulse 94, resp. rate 17, SpO2 97 %. Gen: Obese man in moderate respiratory distress. Head: Normocephalic, atraumatic. Eyes: Pupils equal, round and reactive to light. Extraocular movements intact.  Sclerae nonicteric. No lid lag. Mouth: Oropharynx  reveals mildly dry mucous membranes. Dentition is fair. Neck: Supple, no thyromegaly, no lymphadenopathy, no jugular venous distention. Chest: Lungs are decreased throughout with poor air movement. No rales, rhonchi or wheezes.  CV: Heart sounds are regular with an S1, S2. No murmurs, rubs, clicks, or gallops.  Abdomen: Soft, nontender, nondistended with normal active bowel sounds. No hepatosplenomegaly or palpable masses. Extremities: Extremities are without clubbing, or cyanosis. 1+ edema. Pedal pulses 2+.  Skin: Warm and dry. No rashes, lesions or wounds. Neuro: Alert and oriented times 3; grossly nonfocal.  Psych: Insight is good and judgment is poor. Mood and affect irritable.   Data Review:    Labs: Basic Metabolic Panel:  Recent Labs Lab 01/19/17 1603  NA 140  K 3.6  CL 105  CO2 28  GLUCOSE 129*  BUN 13  CREATININE 0.78  CALCIUM 9.0   Liver Function Tests:  Recent Labs Lab 01/19/17 1603  AST 29  ALT 26  ALKPHOS 39  BILITOT 1.8*  PROT 6.5  ALBUMIN 3.8   CBC:  Recent Labs Lab 01/19/17 1603  WBC 5.8  NEUTROABS 4.1  HGB 10.1*  HCT 32.8*  MCV 99.4  PLT 158   Cardiac Enzymes:  Recent Labs Lab 01/19/17 1603  TROPONINI <0.03    Urinalysis    Component Value Date/Time   COLORURINE YELLOW 01/19/2017 1739   APPEARANCEUR CLEAR 01/19/2017 1739   LABSPEC 1.023  01/19/2017 1739   PHURINE 5.0 01/19/2017 1739   GLUCOSEU NEGATIVE 01/19/2017 1739   GLUCOSEU NEGATIVE 06/30/2010 1050   HGBUR NEGATIVE 01/19/2017 1739   BILIRUBINUR NEGATIVE 01/19/2017 1739   KETONESUR 5 (A) 01/19/2017 1739   PROTEINUR 30 (A) 01/19/2017 1739   UROBILINOGEN 0.2 06/30/2010 1050   NITRITE NEGATIVE 01/19/2017 1739   LEUKOCYTESUR NEGATIVE 01/19/2017 1739      Radiographic Studies: Dg Chest Port 1 View  Result Date: 01/19/2017 CLINICAL DATA:  71 year old male with shortness of breath and hypoxia EXAM: PORTABLE CHEST 1 VIEW COMPARISON:  Prior chest x-ray 09/26/2016 FINDINGS: Interval development of diffuse interstitial prominence in a predominantly bilateral lower lobe distribution. Cardiac and mediastinal contours remain unchanged. Atherosclerotic calcifications are present in the transverse aorta. The upper lungs are hyperlucent suggesting emphysema. No acute osseous abnormality. IMPRESSION: 1. Interval development of diffuse interstitial prominence in the bilateral lower lungs. Differential considerations include pulmonary edema, and an atypical or viral respiratory infection. Acute pneumonitis is also possible but less likely. 2. Advanced upper lung predominant emphysema. Emphysema. (ICD10-J43.9) 3.  Aortic Atherosclerosis (ICD10-170.0) Electronically Signed   By: Jacqulynn Cadet M.D.   On: 01/19/2017 15:38    EKG: Independently reviewed. Sinus rhythm at 92 bpm.   Assessment/Plan:   Principal Problem:   Acute on chronic diastolic CHF (congestive heart failure) (HCC)/Dilated cardiomyopathy/OSA treated with BiPAP Patient will be placed on 80 mg of Lasix every 6 hours. Repeat 2-D echocardiogram ordered. Cycle troponins. Last echocardiogram done 09/29/16 and showed an EF of 60-65 percent with grade 1 diastolic dysfunction. Patient was placed on BiPAP in the ED. Add Avapro. Chest x-ray is personally reviewed and is pictured below.   Active Problems:   Obesity We'll get  weight calculate BMI.    Essential hypertension Continue Lasix.    Coronary atherosclerosis of native coronary artery Continue aspirin. Continue isosorbide. Continue fish oil.    COPD mixed type (HCC)/pulmonary hypertension Continue bronchodilators, Pulmicort, oxygen.    Acute on chronic respiratory failure with hypoxia and hypercapnia (HCC) Multifactorial but likely CHF triggering. No wheezing to suggest  an acute COPD exacerbation.   Attestation regarding necessity of inpatient status:   The appropriate admission status for this patient is INPATIENT. Inpatient status is judged to be reasonable and necessary in order to provide the required intensity of service to ensure the patient's safety. The patient's presenting symptoms, physical exam findings, and initial radiographic and laboratory data in the context of their chronic comorbidities is felt to place them at high risk for further clinical deterioration. Furthermore, it is not anticipated that the patient will be medically stable for discharge from the hospital within 2 midnights of admission. The following factors support the admission status of inpatient.    The patient's presenting symptoms include Significant hypoxic respiratory failure with increased work of breathing.  The worrisome physical exam findings include use of accessory muscles degrees/labored breathing.  The initial radiographic and laboratory data are worrisome because of findings concerning for CHF.  The chronic co-morbidities include chronic respiratory failure on high flow oxygen at 6 L, COPD, obesity, coronary artery disease.   * I certify that at the point of admission it is my clinical judgment that the patient will require inpatient hospital care spanning beyond 2 midnights from the point of admission due to high intensity of service, high risk for further deterioration and high frequency of surveillance required.*    Other information:   DVT  prophylaxis: Lovenox ordered. Code Status: Full code. Family Communication: No family currently present. Disposition Plan: Home in 48-72 hours if stable. Consults called: Cardiology. Admission status: Inpatient.  The medical decision making on this patient was of high complexity and the patient is at high risk for clinical deterioration, therefore this is a level 3 visit.   Nylene Inlow [facility] Pager [phone] Cell: [phone]   If 7PM-7AM, please contact night-coverage [email or web address] Password [email or web address] 01/19/2017, 8:05 PM

## 2017-01-20 ENCOUNTER — Inpatient Hospital Stay (HOSPITAL_COMMUNITY): Payer: Medicare Other

## 2017-01-20 DIAGNOSIS — E669 Obesity, unspecified: Secondary | ICD-10-CM

## 2017-01-20 LAB — BASIC METABOLIC PANEL
Anion gap: 14 (ref 5–15)
BUN: 15 mg/dL (ref 6–20)
CALCIUM: 9 mg/dL (ref 8.9–10.3)
CHLORIDE: 101 mmol/L (ref 101–111)
CO2: 24 mmol/L (ref 22–32)
CREATININE: 0.8 mg/dL (ref 0.61–1.24)
GFR calc non Af Amer: >60 mL/min (ref >60)
GLUCOSE: 178 mg/dL — AB (ref 65–99)
Potassium: 3.8 mmol/L (ref 3.5–5.1)
Sodium: 139 mmol/L (ref 135–145)

## 2017-01-20 LAB — ECHOCARDIOGRAM COMPLETE
Height: 66 in
Weight: 3649.6 oz

## 2017-01-20 LAB — CREATININE, SERUM: Creatinine, Ser: 0.87 mg/dL (ref 0.61–1.24)

## 2017-01-20 LAB — TROPONIN I
Troponin I: <0.03 ng/mL (ref <0.03)
Troponin I: <0.03 ng/mL (ref <0.03)

## 2017-01-20 LAB — MRSA PCR SCREENING: MRSA BY PCR: NEGATIVE

## 2017-01-20 MED ORDER — CHLORHEXIDINE GLUCONATE 0.12 % MT SOLN
15.0000 mL | Freq: Two times a day (BID) | OROMUCOSAL | Status: DC
  Administered 2017-01-20: 15 mL via OROMUCOSAL
  Filled 2017-01-20: qty 15

## 2017-01-20 MED ORDER — ORAL CARE MOUTH RINSE
15.0000 mL | Freq: Two times a day (BID) | OROMUCOSAL | Status: DC
  Administered 2017-01-20 – 2017-01-25 (×10): 15 mL via OROMUCOSAL

## 2017-01-20 MED ORDER — ORAL CARE MOUTH RINSE: 15.0000 mL | Freq: Two times a day (BID) | OROMUCOSAL | Status: DC

## 2017-01-20 NOTE — Progress Notes (Signed)
Pt. admitted to 4[facility] from ED, report from [place], [place]. Oriented to room, call bell, visitation policy and fall safety plan. Bed in low position, yellow non-skid socks in place, bed alarm on. Full assessment to Epic; skin assessed with Baxter Flattery, RN. Vital signs stable, on BiPAP, A&O and appropriate. Denies any pain or discomfort at this time, denies any needs. Declined RN offer to call spouse with update of admission to unit. Will continue to monitor.

## 2017-01-20 NOTE — Progress Notes (Signed)
Progress Note    [name]  XQJ:[id number] DOB: 1946-11-15  DOA: 01/19/2017 PCP: [name], MD    Brief Narrative:   Chief complaint: Dyspnea  [name] is an 71 y.o. male with a PMH of chronic respiratory failure on 6 L of oxygen, and nocturnal BiPAP, COPD, cardiogenic pulmonary edema, chronic diastolic CHF, history of nonsustained ventricular tachycardia, and coronary artery disease who was admitted 01/19/17 for evaluation of a 24-hour history of worsening dyspnea.  Assessment/Plan:   Principal Problem:   Acute on chronic diastolic CHF (congestive heart failure) (HCC)/Dilated cardiomyopathy/OSA treated with BiPAP Currently on 80 mg of Lasix every 6 hours. Repeat 2-D echocardiogram ordered. Cycle troponins. Last echocardiogram done 09/29/16 and showed an EF of 60-65 percent with grade 1 diastolic dysfunction. Patient was placed on BiPAP in the ED. Add Avapro. Admission Chest x-ray is pictured below. I/O balance -1.1 L overnight. Troponins negative 3.Feels better, but not quite back to baseline.   Active Problems:   Obesity (BMI 30-39.9) Body mass index is 36.82 kg/m.    Essential hypertension Continue Lasix.    Coronary atherosclerosis of native coronary artery Continue aspirin. Continue isosorbide. Continue fish oil.    COPD mixed type (HCC)/pulmonary hypertension Continue bronchodilators, Pulmicort, oxygen.    Acute on chronic respiratory failure with hypoxia and hypercapnia (HCC) Multifactorial but likely CHF triggering. No wheezing to suggest an acute COPD exacerbation.   Family Communication/Anticipated D/C date and plan/Code Status   DVT prophylaxis: Lovenox ordered. Code Status: Full Code.  Family Communication: No family at bedside. Disposition Plan: Home when stable.   Medical Consultants:    None.   Procedures:    None  Anti-Infectives:    None  Subjective:   Patient feels better, but not yet quite back to baseline.  Still dyspneic. No chest pain.  Objective:    Vitals:   01/20/17 0645 01/20/17 0700 01/20/17 0715 01/20/17 0753  BP: (!) 146/85 (!) 147/77 (!) 145/79 (!) 148/76  Pulse: 89 80 88 84  Resp: '17 17 17 19  '$ Temp:    97.6 F (36.4 C)  TempSrc:    Oral  SpO2: 96% 97% 92% 91%  Weight:      Height:        Intake/Output Summary (Last 24 hours) at 01/20/17 0829 Last data filed at 01/20/17 0441  Gross per 24 hour  Intake                0 ml  Output             1175 ml  Net            -1175 ml   Filed Weights   01/20/17 0500  Weight: 103.5 kg (228 lb 1.6 oz)    Exam: General exam: Appears calm and comfortable.  Respiratory system: Diminished. Respiratory effort mildly increased with pursed lipped breathing. Cardiovascular system: S1 & S2 heard, RRR. No JVD,  rubs, gallops or clicks. No murmurs. Gastrointestinal system: Abdomen is nondistended, soft and nontender. No organomegaly or masses felt. Normal bowel sounds heard. Central nervous system: Alert and oriented. No focal neurological deficits. Extremities: No clubbing,  or cyanosis. No edema. Skin: No rashes, lesions or ulcers. Psychiatry: Judgement and insight appear minimally impaired. Mood & affect flat.   Data Reviewed:   I have personally reviewed following labs and imaging studies:  Labs: Basic Metabolic Panel:  Recent Labs Lab 01/19/17 1603 01/19/17 2342 01/20/17 0206  NA 140  --  139  K 3.6  --  3.8  CL 105  --  101  CO2 28  --  24  GLUCOSE 129*  --  178*  BUN 13  --  15  CREATININE 0.78 0.87 0.80  CALCIUM 9.0  --  9.0   GFR Estimated Creatinine Clearance: 95.5 mL/min (by C-G formula based on SCr of 0.8 mg/dL). Liver Function Tests:  Recent Labs Lab 01/19/17 1603  AST 29  ALT 26  ALKPHOS 39  BILITOT 1.8*  PROT 6.5  ALBUMIN 3.8   No results for input(s): LIPASE, AMYLASE in the last 168 hours. No results for input(s): AMMONIA in the last 168 hours. Coagulation profile No results for input(s):  INR, PROTIME in the last 168 hours.  CBC:  Recent Labs Lab 01/19/17 1603  WBC 5.8  NEUTROABS 4.1  HGB 10.1*  HCT 32.8*  MCV 99.4  PLT 158   Cardiac Enzymes:  Recent Labs Lab 01/19/17 1603 01/19/17 2342 01/20/17 0206  TROPONINI <0.03 <0.03 <0.03   BNP (last 3 results) No results for input(s): PROBNP in the last 8760 hours. CBG: No results for input(s): GLUCAP in the last 168 hours. D-Dimer: No results for input(s): DDIMER in the last 72 hours. Hgb A1c: No results for input(s): HGBA1C in the last 72 hours. Lipid Profile: No results for input(s): CHOL, HDL, LDLCALC, TRIG, CHOLHDL, LDLDIRECT in the last 72 hours. Thyroid function studies: No results for input(s): TSH, T4TOTAL, T3FREE, THYROIDAB in the last 72 hours.  Invalid input(s): FREET3 Anemia work up: No results for input(s): VITAMINB12, FOLATE, FERRITIN, TIBC, IRON, RETICCTPCT in the last 72 hours. Sepsis Labs:  Recent Labs Lab 01/19/17 1603 01/19/17 1614  WBC 5.8  --   LATICACIDVEN  --  1.15    Microbiology Recent Results (from the past 240 hour(s))  MRSA PCR Screening     Status: None   Collection Time: 01/20/17  4:52 AM  Result Value Ref Range Status   MRSA by PCR NEGATIVE NEGATIVE Final    Comment:        The GeneXpert MRSA Assay (FDA approved for NASAL specimens only), is one component of a comprehensive MRSA colonization surveillance program. It is not intended to diagnose MRSA infection nor to guide or monitor treatment for MRSA infections.     Radiology: Dg Chest Port 1 View  Result Date: 01/19/2017 CLINICAL DATA:  71 year old male with shortness of breath and hypoxia EXAM: PORTABLE CHEST 1 VIEW COMPARISON:  Prior chest x-ray 09/26/2016 FINDINGS: Interval development of diffuse interstitial prominence in a predominantly bilateral lower lobe distribution. Cardiac and mediastinal contours remain unchanged. Atherosclerotic calcifications are present in the transverse aorta. The upper lungs  are hyperlucent suggesting emphysema. No acute osseous abnormality. IMPRESSION: 1. Interval development of diffuse interstitial prominence in the bilateral lower lungs. Differential considerations include pulmonary edema, and an atypical or viral respiratory infection. Acute pneumonitis is also possible but less likely. 2. Advanced upper lung predominant emphysema. Emphysema. (ICD10-J43.9) 3.  Aortic Atherosclerosis (ICD10-170.0) Electronically Signed   By: Jacqulynn Cadet M.D.   On: 01/19/2017 15:38    Medications:   . aspirin  81 mg Oral QHS  . budesonide  0.25 mg Nebulization BID  . chlorhexidine  15 mL Mouth Rinse BID  . enoxaparin (LOVENOX) injection  40 mg Subcutaneous Q24H  . furosemide  80 mg Intravenous Q6H  . ipratropium-albuterol  3 mL Nebulization QID  . irbesartan  75 mg Oral Daily  . isosorbide mononitrate  30 mg Oral  BID  . mouth rinse  15 mL Mouth Rinse q12n4p  . multivitamin with minerals  1 tablet Oral Daily  . omega-3 acid ethyl esters  1 g Oral Daily  . potassium chloride  40 mEq Oral BID  . simvastatin  20 mg Oral q1800  . sodium chloride flush  3 mL Intravenous Q12H   Continuous Infusions:  Medical decision making is of high complexity and this patient is at high risk of deterioration, therefore this is a level 3 visit.  (> 4 problem points, 2 data points, high risk)   LOS: 1 day   Maximum Reiland  [facility] Pager [phone]. If unable to reach me by pager, please call my cell phone at [phone].  *Please refer to [email or web address], password [email or web address] to get updated schedule on who will round on this patient, as hospitalists switch teams weekly. If 7PM-7AM, please contact night-coverage at [email or web address], password [email or web address] for any overnight needs.  01/20/2017, 8:29 AM

## 2017-01-20 NOTE — ED Notes (Signed)
Hospital bed ordered.

## 2017-01-20 NOTE — Progress Notes (Signed)
RT placed patient on bipap. Patient is tolerating well at this time. No respiratory distress noted. RT will continue to monitor as needed.

## 2017-01-20 NOTE — Progress Notes (Signed)
  Echocardiogram 2D Echocardiogram has been performed.  [name] M 01/20/2017, 12:41 PM

## 2017-01-21 LAB — BASIC METABOLIC PANEL
Anion gap: 8 (ref 5–15)
BUN: 21 mg/dL — AB (ref 6–20)
CHLORIDE: 100 mmol/L — AB (ref 101–111)
CO2: 30 mmol/L (ref 22–32)
Calcium: 8.8 mg/dL — ABNORMAL LOW (ref 8.9–10.3)
Creatinine, Ser: 0.9 mg/dL (ref 0.61–1.24)
GFR calc Af Amer: >60 mL/min (ref >60)
GLUCOSE: 130 mg/dL — AB (ref 65–99)
Potassium: 4 mmol/L (ref 3.5–5.1)
Sodium: 138 mmol/L (ref 135–145)

## 2017-01-21 MED ORDER — GUAIFENESIN ER 600 MG PO TB12
600.0000 mg | ORAL_TABLET | Freq: Two times a day (BID) | ORAL | Status: DC
Start: 2017-01-21 — End: 2017-01-24
  Administered 2017-01-21 – 2017-01-24 (×7): 600 mg via ORAL
  Filled 2017-01-21 (×7): qty 1

## 2017-01-21 MED ORDER — POTASSIUM CHLORIDE CRYS ER 20 MEQ PO TBCR
40.0000 meq | EXTENDED_RELEASE_TABLET | Freq: Every day | ORAL | Status: DC
  Administered 2017-01-22 – 2017-01-26 (×5): 40 meq via ORAL
  Filled 2017-01-21 (×5): qty 2

## 2017-01-21 MED ORDER — LEVOFLOXACIN IN D5W 750 MG/150ML IV SOLN
750.0000 mg | INTRAVENOUS | Status: DC
  Administered 2017-01-21 – 2017-01-23 (×3): 750 mg via INTRAVENOUS
  Filled 2017-01-21 (×4): qty 150

## 2017-01-21 MED ORDER — PREDNISONE 20 MG PO TABS
40.0000 mg | ORAL_TABLET | Freq: Every day | ORAL | Status: DC
  Administered 2017-01-21 – 2017-01-26 (×6): 40 mg via ORAL
  Filled 2017-01-21 (×6): qty 2

## 2017-01-21 MED ORDER — FUROSEMIDE 10 MG/ML IJ SOLN
80.0000 mg | Freq: Two times a day (BID) | INTRAMUSCULAR | Status: DC
  Administered 2017-01-21 – 2017-01-24 (×6): 80 mg via INTRAVENOUS
  Filled 2017-01-21 (×6): qty 8

## 2017-01-21 NOTE — Progress Notes (Signed)
Patient stated he would call when he is ready to go on BIPAP for the night.

## 2017-01-21 NOTE — Progress Notes (Signed)
Pharmacy Antibiotic Note  [name] is a 71 y.o. male admitted on 01/19/2017 with pneumonia.  Pharmacy has been consulted for Levaquin dosing.  Plan: 1. Levaquin 750 mg q 24 hrs. 2. Watch renal function. 3. F/u cultures and renal function.  Height: '5\' 6"'$  (167.6 cm) Weight: 226 lb 3.2 oz (102.6 kg) IBW/kg (Calculated) : 63.8  Temp (24hrs), Avg:97.6 F (36.4 C), Min:97.2 F (36.2 C), Max:98 F (36.7 C)   Recent Labs Lab 01/19/17 1603 01/19/17 1614 01/19/17 2342 01/20/17 0206 01/21/17 0237  WBC 5.8  --   --   --   --   CREATININE 0.78  --  0.87 0.80 0.90  LATICACIDVEN  --  1.15  --   --   --     Estimated Creatinine Clearance: 84.4 mL/min (by C-G formula based on SCr of 0.9 mg/dL).    Allergies  Allergen Reactions  . Amlodipine Swelling    Antimicrobials this admission:  Levaquin 3/9 >> *  Dose adjustments this admission:   Microbiology results:  * BCx: * * UCx: * 3/8 Sputum:   3/8 MRSA PCR: neg  Thank you for allowing pharmacy to be a part of this patient's care.  Uvaldo Rising, BCPS  Clinical Pharmacist Pager [phone]  01/21/2017 1:17 PM

## 2017-01-21 NOTE — Care Management Note (Signed)
Case Management Note  Patient Details  Name: ARNALDO HEFFRON MRN: [id number] Date of Birth: June 24, 1946  Subjective/Objective:     Pt admitted with acute on chronic HF              Action/Plan:  Pt independent from home with wife - has walker and cane but does not use it.  Has been on supplemental oxygen for approximately 1 year 4-6 liters with [employer] - confirmed he has working BIPAP in the home and uses it every night.  Pt states he does weighs himself everyday and never adds salt to food - CM educated pt on daily weights and continuing to adhere to low salt diet.  Pt remains on IV lasix - CM will continue to follow for discharge needs.   Expected Discharge Date:                  Expected Discharge Plan:  [place]  In-House Referral:     Discharge planning Services  CM Consult  Post Acute Care Choice:    Choice offered to:     DME Arranged:    DME Agency:     HH Arranged:    [place] Agency:     Status of Service:  In process, will continue to follow  If discussed at Long Length of Stay Meetings, dates discussed:    Additional Comments:  [name], RN 01/21/2017, 10:13 AM

## 2017-01-21 NOTE — Care Management Note (Addendum)
Case Management Note  Patient Details  Name: [name] MRN: [id number] Date of Birth: January 30, 1946  Subjective/Objective:     Pt admitted with acute on chronic HF              Action/Plan:  Pt independent from home with wife - has walker and cane but does not use it.  Has been on supplemental oxygen for approximately 1 year 4-6 liters with [employer] - confirmed he has working BIPAP in the home and uses it every night.  Pt states he does weighs himself everyday and never adds salt to food - CM educated pt on daily weights and continuing to adhere to low salt diet.  Pt is in agreement with [facility] for disease management - Cm requested order via physician sticky note.  Pt remains on IV lasix - CM will continue to follow for discharge needs.  Pt has portable oxygen tank her for transport home   Expected Discharge Date:                  Expected Discharge Plan:  [place]  In-House Referral:     Discharge planning Services  CM Consult  Post Acute Care Choice:    Choice offered to:     DME Arranged:    DME Agency:     HH Arranged:    HH Agency:     Status of Service:  In process, will continue to follow  If discussed at Long Length of Stay Meetings, dates discussed:    Additional Comments: CM contacted HF liason and requested assessment Andrew Labrador, RN 01/21/2017, 10:18 AM

## 2017-01-21 NOTE — Progress Notes (Signed)
Progress Note    RAYDON CHAPPUIS  UXN:[id number] DOB: 1945-12-08  DOA: 01/19/2017 PCP: [name], MD    Brief Narrative:   Chief complaint: Dyspnea  THORVALD ORSINO is an 71 y.o. male with a PMH of chronic respiratory failure on 6 L of oxygen, and nocturnal BiPAP, COPD, cardiogenic pulmonary edema, chronic diastolic CHF, history of nonsustained ventricular tachycardia, and coronary artery disease who was admitted 01/19/17 for evaluation of a 24-hour history of worsening dyspnea.  Assessment/Plan:   Principal Problem:   Acute on chronic diastolic CHF (congestive heart failure) (HCC)/Dilated cardiomyopathy/OSA treated with BiPAP Currently on 80 mg of Lasix every 6 hours. Repeat 2-D echocardiogram ordered: EF 55-60%, no RWMA. . Last echocardiogram done 09/29/16 and showed an EF of 60-65 percent with grade 1 diastolic dysfunction. Troponins negative. Patient was placed on BiPAP in the ED, weaned off the following morning.  Admission Chest x-ray is pictured below, repeat in a.m. I/O balance -1.6 L overnight. Weight down 2 pounds. Creatinine stable with diuresis. Feels better, but not quite back to baseline.    Active Problems:   Obesity (BMI 30-39.9) Body mass index is 36.82 kg/m.    Essential hypertension Continue Lasix.    Coronary atherosclerosis of native coronary artery Continue aspirin. Continue isosorbide. Continue fish oil.    COPD mixed type (HCC)/pulmonary hypertension Continue bronchodilators, Pulmicort, oxygen. Will add Levaquin given purulent sputum, and prednisone 40 mg daily.    Acute on chronic respiratory failure with hypoxia and hypercapnia (HCC) Multifactorial but likely CHF triggering. Will treat for possible COPD exacerbation as well. Continue nocturnal CPAP.   Family Communication/Anticipated D/C date and plan/Code Status   DVT prophylaxis: Lovenox ordered. Code Status: Full Code.  Family Communication: Wife at bedside. Disposition Plan: Home  when stable.   Medical Consultants:    None.   Procedures:    None  Anti-Infectives:    Levaquin 01/21/17--->  Subjective:   Patient feels better, but not yet quite back to baseline. Still dyspneic. No chest pain. Reports purulent sputum and some mild epistaxis.   Objective:    Vitals:   01/21/17 0500 01/21/17 0805 01/21/17 0833 01/21/17 0835  BP:  121/68    Pulse:  86    Resp:  (!) 21    Temp:  97.7 F (36.5 C)    TempSrc:  Oral    SpO2:  97% 97% 96%  Weight: 102.6 kg (226 lb 3.2 oz)     Height:        Intake/Output Summary (Last 24 hours) at 01/21/17 0839 Last data filed at 01/21/17 0700  Gross per 24 hour  Intake              603 ml  Output             2250 ml  Net            -1647 ml   Filed Weights   01/20/17 0500 01/21/17 0500  Weight: 103.5 kg (228 lb 1.6 oz) 102.6 kg (226 lb 3.2 oz)    Exam: General exam: Appears calm and comfortable. Sitting up. Respiratory system: Diminished. Respiratory effort mildly increased with pursed lipped breathing. No wheeze or crackles. Cardiovascular system: S1 & S2 heard, RRR. No JVD,  rubs, gallops or clicks. No murmurs. Gastrointestinal system: Abdomen is nondistended, soft and nontender. No organomegaly or masses felt. Normal bowel sounds heard. Central nervous system: Alert and oriented. No focal neurological deficits. Extremities: No clubbing,  or cyanosis.  No edema. Skin: No rashes, lesions or ulcers. Psychiatry: Judgement and insight appear minimally impaired. Mood & affect appropriate.   Data Reviewed:   I have personally reviewed following labs and imaging studies:  Labs: Basic Metabolic Panel:  Recent Labs Lab 01/19/17 1603 01/19/17 2342 01/20/17 0206 01/21/17 0237  NA 140  --  139 138  K 3.6  --  3.8 4.0  CL 105  --  101 100*  CO2 28  --  24 30  GLUCOSE 129*  --  178* 130*  BUN 13  --  15 21*  CREATININE 0.78 0.87 0.80 0.90  CALCIUM 9.0  --  9.0 8.8*   GFR Estimated Creatinine  Clearance: 84.4 mL/min (by C-G formula based on SCr of 0.9 mg/dL). Liver Function Tests:  Recent Labs Lab 01/19/17 1603  AST 29  ALT 26  ALKPHOS 39  BILITOT 1.8*  PROT 6.5  ALBUMIN 3.8   No results for input(s): LIPASE, AMYLASE in the last 168 hours. No results for input(s): AMMONIA in the last 168 hours. Coagulation profile No results for input(s): INR, PROTIME in the last 168 hours.  CBC:  Recent Labs Lab 01/19/17 1603  WBC 5.8  NEUTROABS 4.1  HGB 10.1*  HCT 32.8*  MCV 99.4  PLT 158   Cardiac Enzymes:  Recent Labs Lab 01/19/17 1603 01/19/17 2342 01/20/17 0206 01/20/17 0816  TROPONINI <0.03 <0.03 <0.03 <0.03   BNP (last 3 results) No results for input(s): PROBNP in the last 8760 hours. CBG: No results for input(s): GLUCAP in the last 168 hours. D-Dimer: No results for input(s): DDIMER in the last 72 hours. Hgb A1c: No results for input(s): HGBA1C in the last 72 hours. Lipid Profile: No results for input(s): CHOL, HDL, LDLCALC, TRIG, CHOLHDL, LDLDIRECT in the last 72 hours. Thyroid function studies: No results for input(s): TSH, T4TOTAL, T3FREE, THYROIDAB in the last 72 hours.  Invalid input(s): FREET3 Anemia work up: No results for input(s): VITAMINB12, FOLATE, FERRITIN, TIBC, IRON, RETICCTPCT in the last 72 hours. Sepsis Labs:  Recent Labs Lab 01/19/17 1603 01/19/17 1614  WBC 5.8  --   LATICACIDVEN  --  1.15    Microbiology Recent Results (from the past 240 hour(s))  MRSA PCR Screening     Status: None   Collection Time: 01/20/17  4:52 AM  Result Value Ref Range Status   MRSA by PCR NEGATIVE NEGATIVE Final    Comment:        The GeneXpert MRSA Assay (FDA approved for NASAL specimens only), is one component of a comprehensive MRSA colonization surveillance program. It is not intended to diagnose MRSA infection nor to guide or monitor treatment for MRSA infections.     Radiology: Dg Chest Port 1 View  Result Date:  01/19/2017 CLINICAL DATA:  71 year old male with shortness of breath and hypoxia EXAM: PORTABLE CHEST 1 VIEW COMPARISON:  Prior chest x-ray 09/26/2016 FINDINGS: Interval development of diffuse interstitial prominence in a predominantly bilateral lower lobe distribution. Cardiac and mediastinal contours remain unchanged. Atherosclerotic calcifications are present in the transverse aorta. The upper lungs are hyperlucent suggesting emphysema. No acute osseous abnormality. IMPRESSION: 1. Interval development of diffuse interstitial prominence in the bilateral lower lungs. Differential considerations include pulmonary edema, and an atypical or viral respiratory infection. Acute pneumonitis is also possible but less likely. 2. Advanced upper lung predominant emphysema. Emphysema. (ICD10-J43.9) 3.  Aortic Atherosclerosis (ICD10-170.0) Electronically Signed   By: Jacqulynn Cadet M.D.   On: 01/19/2017 15:38    Medications:   .  aspirin  81 mg Oral QHS  . budesonide  0.25 mg Nebulization BID  . enoxaparin (LOVENOX) injection  40 mg Subcutaneous Q24H  . furosemide  80 mg Intravenous Q6H  . ipratropium-albuterol  3 mL Nebulization QID  . irbesartan  75 mg Oral Daily  . isosorbide mononitrate  30 mg Oral BID  . mouth rinse  15 mL Mouth Rinse BID  . multivitamin with minerals  1 tablet Oral Daily  . omega-3 acid ethyl esters  1 g Oral Daily  . potassium chloride  40 mEq Oral BID  . simvastatin  20 mg Oral q1800  . sodium chloride flush  3 mL Intravenous Q12H   Continuous Infusions:  Medical decision making is of high complexity and this patient is at high risk of deterioration, therefore this is a level 3 visit.  (> 4 problem points, 2 data points, high risk)   LOS: 2 days   RAMA,CHRISTINA  [facility] Pager [phone]. If unable to reach me by pager, please call my cell phone at [phone].  *Please refer to [email or web address], password [email or web address] to get updated schedule on who will round on this patient, as  hospitalists switch teams weekly. If 7PM-7AM, please contact night-coverage at [email or web address], password [email or web address] for any overnight needs.  01/21/2017, 8:39 AM

## 2017-01-21 NOTE — Progress Notes (Signed)
RT placed patient on BIPAP for the night. Patient is sitting up in bed and tolerating BIPAP well at this time. RT will continue to monitor as needed.

## 2017-01-22 ENCOUNTER — Inpatient Hospital Stay (HOSPITAL_COMMUNITY): Payer: Medicare Other

## 2017-01-22 LAB — BASIC METABOLIC PANEL
ANION GAP: 11 (ref 5–15)
BUN: 23 mg/dL — AB (ref 6–20)
CALCIUM: 9.2 mg/dL (ref 8.9–10.3)
CO2: 29 mmol/L (ref 22–32)
Chloride: 94 mmol/L — ABNORMAL LOW (ref 101–111)
Creatinine, Ser: 0.88 mg/dL (ref 0.61–1.24)
GFR calc Af Amer: >60 mL/min (ref >60)
GFR calc non Af Amer: >60 mL/min (ref >60)
Glucose, Bld: 149 mg/dL — ABNORMAL HIGH (ref 65–99)
Potassium: 4.2 mmol/L (ref 3.5–5.1)
Sodium: 134 mmol/L — ABNORMAL LOW (ref 135–145)

## 2017-01-22 NOTE — Evaluation (Signed)
Physical Therapy Evaluation Patient Details Name: [name] MRN: [id number] DOB: 12/11/1945 Today's Date: 01/22/2017   History of Present Illness  Pt is a 71 y/o male admitted secondary to acute respiratory failure requiring BiPAP. PMH including but not limited to COPD (on 4-6L of O2 at all times), CHF, CAD and HTN.   Clinical Impression  Pt presented sitting EOB when PT entered room. Prior to admission, pt reported that he was independent with all functional mobility as well as ADLs. Pt on 4L of supplemental O2 at rest with SPO2 maintaining at 94%. With ambulation, pt's SPO2 maintaining at 87-91% on 6L of O2. All other VSS throughout. Pt would continue to benefit from skilled physical therapy services at this time while admitted to ensure a safe d/c home.       Follow Up Recommendations No PT follow up    Equipment Recommendations  None recommended by PT    Recommendations for Other Services       Precautions / Restrictions Precautions Precautions: None Restrictions Weight Bearing Restrictions: No      Mobility  Bed Mobility               General bed mobility comments: pt sitting EOB when PT entered room  Transfers Overall transfer level: Needs assistance Equipment used: None Transfers: Sit to/from Stand Sit to Stand: Supervision         General transfer comment: supervision for safety  Ambulation/Gait Ambulation/Gait assistance: Supervision Ambulation Distance (Feet): 20 Feet Assistive device: None Gait Pattern/deviations: Step-through pattern;Decreased stride length;Wide base of support Gait velocity: decreased Gait velocity interpretation: Below normal speed for age/gender General Gait Details: no instability, LOB or need for physical assistance. Pt ambulated on 6L of O2 with SPO2 maintaining at 87-91% throughout  Stairs            Wheelchair Mobility    Modified Rankin (Stroke Patients Only)       Balance Overall balance assessment:  Needs assistance Sitting-balance support: Feet supported Sitting balance-Leahy Scale: Good     Standing balance support: During functional activity;No upper extremity supported Standing balance-Leahy Scale: Fair                               Pertinent Vitals/Pain Pain Assessment: No/denies pain    Home Living Family/patient expects to be discharged to:: Private residence Living Arrangements: Spouse/significant other Available Help at Discharge: Family;Available 24 hours/day Type of Home: House Home Access: Stairs to enter Entrance Stairs-Rails: Right;Left;Can reach both Entrance Stairs-Number of Steps: 3 Home Layout: One level Home Equipment: Walker - 2 wheels      Prior Function Level of Independence: Independent               Hand Dominance        Extremity/Trunk Assessment   Upper Extremity Assessment Upper Extremity Assessment: Overall WFL for tasks assessed    Lower Extremity Assessment Lower Extremity Assessment: Overall WFL for tasks assessed (sensation grossly intact)    Cervical / Trunk Assessment Cervical / Trunk Assessment: Normal  Communication   Communication: No difficulties  Cognition Arousal/Alertness: Awake/alert Behavior During Therapy: WFL for tasks assessed/performed Overall Cognitive Status: Within Functional Limits for tasks assessed                      General Comments      Exercises     Assessment/Plan    PT Assessment Patient needs continued  PT services  PT Problem List Decreased activity tolerance;Decreased balance;Decreased mobility;Decreased coordination;Decreased safety awareness;Cardiopulmonary status limiting activity       PT Treatment Interventions DME instruction;Gait training;Stair training;Functional mobility training;Therapeutic activities;Therapeutic exercise;Balance training;Neuromuscular re-education;Patient/family education    PT Goals (Current goals can be found in the Care Plan  section)  Acute Rehab PT Goals Patient Stated Goal: return home PT Goal Formulation: With patient Time For Goal Achievement: 02/05/17 Potential to Achieve Goals: Good    Frequency Min 3X/week   Barriers to discharge        Co-evaluation               End of Session Equipment Utilized During Treatment: Gait belt Activity Tolerance: Patient tolerated treatment well;Other (comment) (self-limiting with distance ambulated) Patient left: in chair;with call bell/phone within reach Nurse Communication: Mobility status PT Visit Diagnosis: Other abnormalities of gait and mobility (R26.89)         Time: 5072-2575 PT Time Calculation (min) (ACUTE ONLY): 15 min   Charges:   PT Evaluation $PT Eval Moderate Complexity: 1 Procedure     PT G CodesClearnce Sorrel Kodi Guerrera 01/22/2017, 10:58 AM Sherie Don, PT, DPT [phone]

## 2017-01-22 NOTE — Progress Notes (Signed)
Progress Note    [name]  QQP:[id number] DOB: 21-Apr-1946  DOA: 01/19/2017 PCP: [name], MD    Brief Narrative:   Chief complaint: Dyspnea  [name] is an 71 y.o. male with a PMH of chronic respiratory failure on 6 L of oxygen, and nocturnal BiPAP, COPD, cardiogenic pulmonary edema, chronic diastolic CHF, history of nonsustained ventricular tachycardia, and coronary artery disease who was admitted 01/19/17 for evaluation of a 24-hour history of worsening dyspnea.  Assessment/Plan:   Principal Problem:   Acute on chronic diastolic CHF (congestive heart failure) (HCC)/Dilated cardiomyopathy/OSA treated with BiPAP Currently on 80 mg of Lasix every 12 hours. Repeat 2-D echocardiogram showed: EF 55-60%, no RWMA. . Last echocardiogram done 09/29/16 and showed an EF of 60-65 percent with grade 1 diastolic dysfunction. Troponins negative. Patient was placed on BiPAP in the ED, weaned off the following morning.  Admission and follow-up Chest x-rays are pictured below, improvement seen. I/O balance -1.8 L overnight. Weight down 3 pounds. Creatinine stable with diuresis.      Active Problems:   Obesity (BMI 30-39.9) Body mass index is 36.82 kg/m.     Essential hypertension Continue Lasix.    Coronary atherosclerosis of native coronary artery Continue aspirin. Continue isosorbide. Continue fish oil.    COPD mixed type (HCC)/pulmonary hypertension Continue bronchodilators, Pulmicort, oxygen, Levaquin and prednisone 40 mg daily.    Acute on chronic respiratory failure with hypoxia and hypercapnia (HCC) Multifactorial but likely CHF triggering. Will treat for possible COPD exacerbation as well. Continue nocturnal CPAP.   Family Communication/Anticipated D/C date and plan/Code Status   DVT prophylaxis: Lovenox ordered. Code Status: Full Code.  Family Communication: Wife at bedside. Disposition Plan: Home when stable.   Medical Consultants:     None.   Procedures:    None  Anti-Infectives:    Levaquin 01/21/17--->  Subjective:   Patient continues to improve, but not yet quite back to baseline. Still dyspneic. No chest pain. Still coughing up purulent sputum.   Objective:    Vitals:   01/22/17 0446 01/22/17 0700 01/22/17 0821 01/22/17 0822  BP:  131/68    Pulse:  91    Resp:  19    Temp: 97.7 F (36.5 C) 97.8 F (36.6 C)    TempSrc: Oral Oral    SpO2:  96% 97% 96%  Weight:      Height:        Intake/Output Summary (Last 24 hours) at 01/22/17 0840 Last data filed at 01/22/17 0406  Gross per 24 hour  Intake              935 ml  Output             2760 ml  Net            -1825 ml   Filed Weights   01/20/17 0500 01/21/17 0500 01/22/17 0404  Weight: 103.5 kg (228 lb 1.6 oz) 102.6 kg (226 lb 3.2 oz) 102.4 kg (225 lb 11.2 oz)    Exam: General exam: Appears calm and comfortable. Sitting up. Respiratory system: Diminished. Respiratory effort mildly increased with pursed lipped breathing. No wheeze or crackles. Cardiovascular system: S1 & S2 heard, RRR. No JVD,  rubs, gallops or clicks. No murmurs. Gastrointestinal system: Abdomen is obese, soft and nontender. No organomegaly or masses felt. Normal bowel sounds heard. Central nervous system: Alert and oriented. No focal neurological deficits. Extremities: No clubbing,  or cyanosis. No edema. Skin: No rashes,  lesions or ulcers. Psychiatry: Judgement and insight appear minimally impaired. Mood & affect appropriate.   Data Reviewed:   I have personally reviewed following labs and imaging studies:  Labs: Basic Metabolic Panel:  Recent Labs Lab 01/19/17 1603 01/19/17 2342 01/20/17 0206 01/21/17 0237 01/22/17 0327  NA 140  --  139 138 134*  K 3.6  --  3.8 4.0 4.2  CL 105  --  101 100* 94*  CO2 28  --  '24 30 29  '$ GLUCOSE 129*  --  178* 130* 149*  BUN 13  --  15 21* 23*  CREATININE 0.78 0.87 0.80 0.90 0.88  CALCIUM 9.0  --  9.0 8.8* 9.2    GFR Estimated Creatinine Clearance: 86.3 mL/min (by C-G formula based on SCr of 0.88 mg/dL). Liver Function Tests:  Recent Labs Lab 01/19/17 1603  AST 29  ALT 26  ALKPHOS 39  BILITOT 1.8*  PROT 6.5  ALBUMIN 3.8    CBC:  Recent Labs Lab 01/19/17 1603  WBC 5.8  NEUTROABS 4.1  HGB 10.1*  HCT 32.8*  MCV 99.4  PLT 158   Cardiac Enzymes:  Recent Labs Lab 01/19/17 1603 01/19/17 2342 01/20/17 0206 01/20/17 0816  TROPONINI <0.03 <0.03 <0.03 <0.03   Sepsis Labs:  Recent Labs Lab 01/19/17 1603 01/19/17 1614  WBC 5.8  --   LATICACIDVEN  --  1.15    Microbiology Recent Results (from the past 240 hour(s))  MRSA PCR Screening     Status: None   Collection Time: 01/20/17  4:52 AM  Result Value Ref Range Status   MRSA by PCR NEGATIVE NEGATIVE Final    Comment:        The GeneXpert MRSA Assay (FDA approved for NASAL specimens only), is one component of a comprehensive MRSA colonization surveillance program. It is not intended to diagnose MRSA infection nor to guide or monitor treatment for MRSA infections.     Radiology: Dg Chest Port 1 View  Result Date: 01/22/2017 CLINICAL DATA:  CHF EXAM: PORTABLE CHEST 1 VIEW COMPARISON:  01/19/2017 FINDINGS: Bilateral emphysematous changes primarily involving the upper lobes. Inter social prominence of bilateral lung bases. No focal consolidation, pleural effusion or pneumothorax. Stable cardiomediastinal silhouette. No acute osseous abnormality. IMPRESSION: No acute cardiopulmonary disease. Emphysema. ([id number]-B01.9) Electronically Signed   By: Kathreen Devoid   On: 01/22/2017 08:32    Medications:   . aspirin  81 mg Oral QHS  . budesonide  0.25 mg Nebulization BID  . enoxaparin (LOVENOX) injection  40 mg Subcutaneous Q24H  . furosemide  80 mg Intravenous BID  . guaiFENesin  600 mg Oral BID  . ipratropium-albuterol  3 mL Nebulization QID  . irbesartan  75 mg Oral Daily  . isosorbide mononitrate  30 mg Oral BID  .  levofloxacin (LEVAQUIN) IV  750 mg Intravenous Q24H  . mouth rinse  15 mL Mouth Rinse BID  . multivitamin with minerals  1 tablet Oral Daily  . omega-3 acid ethyl esters  1 g Oral Daily  . potassium chloride  40 mEq Oral Daily  . predniSONE  40 mg Oral Q breakfast  . simvastatin  20 mg Oral q1800  . sodium chloride flush  3 mL Intravenous Q12H   Continuous Infusions:  Medical decision making is of high complexity and this patient is at high risk of deterioration, therefore this is a level 3 visit.  (> 4 problem points, 4 data points, high risk)   LOS: 3 days   Nicol Herbig  [facility] Pager [phone]. If unable to reach me by pager, please call my cell phone at [phone].  *Please refer to [email or web address], password [email or web address] to get updated schedule on who will round on this patient, as hospitalists switch teams weekly. If 7PM-7AM, please contact night-coverage at [email or web address], password [email or web address] for any overnight needs.  01/22/2017, 8:40 AM

## 2017-01-22 NOTE — Progress Notes (Signed)
Patient will have nurse call RT when he is ready to go on BIPAP for the night.

## 2017-01-22 NOTE — Progress Notes (Signed)
RT placed patient on bipap. Patient is sitting up on the side of the bed with no respiratory distress noted at this time. RT will continue to monitor as needed.

## 2017-01-23 LAB — BASIC METABOLIC PANEL
Anion gap: 8 (ref 5–15)
BUN: 29 mg/dL — ABNORMAL HIGH (ref 6–20)
CHLORIDE: 94 mmol/L — AB (ref 101–111)
CO2: 31 mmol/L (ref 22–32)
CREATININE: 1.1 mg/dL (ref 0.61–1.24)
Calcium: 9.1 mg/dL (ref 8.9–10.3)
Glucose, Bld: 149 mg/dL — ABNORMAL HIGH (ref 65–99)
Potassium: 4 mmol/L (ref 3.5–5.1)
SODIUM: 133 mmol/L — AB (ref 135–145)

## 2017-01-23 LAB — CBC
HCT: 33.9 % — ABNORMAL LOW (ref 39.0–52.0)
Hemoglobin: 10.5 g/dL — ABNORMAL LOW (ref 13.0–17.0)
MCH: 30.4 pg (ref 26.0–34.0)
MCHC: 31 g/dL (ref 30.0–36.0)
MCV: 98.3 fL (ref 78.0–100.0)
PLATELETS: 190 10*3/uL (ref 150–400)
RBC: 3.45 MIL/uL — AB (ref 4.22–5.81)
RDW: 18.2 % — ABNORMAL HIGH (ref 11.5–15.5)
WBC: 10.3 10*3/uL (ref 4.0–10.5)

## 2017-01-23 NOTE — Progress Notes (Signed)
Placed pt on BIPAP for HS via [facility], previous settings of 10/5 40%.

## 2017-01-23 NOTE — Progress Notes (Signed)
Progress Note    [name]  WCH:[id number] DOB: 08-12-1946  DOA: 01/19/2017 PCP: [name], MD    Brief Narrative:   Chief complaint: Dyspnea  [name] is an 71 y.o. male with a PMH of chronic respiratory failure on 6 L of oxygen, and nocturnal BiPAP, COPD, cardiogenic pulmonary edema, chronic diastolic CHF, history of nonsustained ventricular tachycardia, and coronary artery disease who was admitted 01/19/17 for evaluation of a 24-hour history of worsening dyspnea.  Assessment/Plan:   Principal Problem:   Acute on chronic diastolic CHF (congestive heart failure) (HCC)/Dilated cardiomyopathy/OSA treated with BiPAP Currently on 80 mg of Lasix every 12 hours. Repeat 2-D echocardiogram showed: EF 55-60%, no RWMA. Last echocardiogram done 09/29/16 and showed an EF of 60-65 percent with grade 1 diastolic dysfunction. Troponins negative. Patient was placed on BiPAP in the ED, weaned off the following morning.  Admission and follow-up Chest x-rays are pictured below, improvement seen. I/O balance -1.5 L overnight. Creatinine stable with diuresis, but beginning to trend up a bit. Feels like his breathing is getting close to baseline.     Active Problems:   Obesity (BMI 30-39.9) Body mass index is 36.82 kg/m.     Essential hypertension Continue Lasix.    Coronary atherosclerosis of native coronary artery Continue aspirin. Continue isosorbide. Continue fish oil.    COPD mixed type (HCC)/pulmonary hypertension Continue bronchodilators, Pulmicort, oxygen, Levaquin and prednisone 40 mg daily.    Acute on chronic respiratory failure with hypoxia and hypercapnia (HCC) Multifactorial but likely CHF triggering. Will treat for possible COPD exacerbation as well. Continue nocturnal CPAP.   Family Communication/Anticipated D/C date and plan/Code Status   DVT prophylaxis: Lovenox ordered. Code Status: Full Code.  Family Communication: Wife and daughter at  bedside. Disposition Plan: Home when stable.   Medical Consultants:    None.   Procedures:    None  Anti-Infectives:    Levaquin 01/21/17--->  Subjective:   Feels like his breathing is getting close to baseline. Still coughing up purulent sputum, but reports it is improving somewhat. Appetite remains good. No chest pain.   Objective:    Vitals:   01/22/17 2200 01/22/17 2348 01/23/17 0000 01/23/17 0400  BP: 127/71 (!) 127/32 130/69 127/70  Pulse: 83 86 82 72  Resp: '18 20 17 17  '$ Temp:  (!) 96.7 F (35.9 C)  97.7 F (36.5 C)  TempSrc:  Oral  Axillary  SpO2: 99% 99% 97% 98%  Weight:    103.5 kg (228 lb 1.6 oz)  Height:        Intake/Output Summary (Last 24 hours) at 01/23/17 0756 Last data filed at 01/23/17 0450  Gross per 24 hour  Intake              670 ml  Output             2150 ml  Net            -1480 ml   Filed Weights   01/21/17 0500 01/22/17 0404 01/23/17 0400  Weight: 102.6 kg (226 lb 3.2 oz) 102.4 kg (225 lb 11.2 oz) 103.5 kg (228 lb 1.6 oz)    Exam: General exam: Appears calm and comfortable. Sitting up. Respiratory system: Diminished. Respiratory effort mildly increased with pursed lipped breathing. No wheeze or crackles. Cardiovascular system: S1 & S2 heard, RRR. No JVD,  rubs, gallops or clicks. No murmurs. Gastrointestinal system: Abdomen is obese, soft and nontender. No organomegaly or masses felt. Normal  bowel sounds heard. Central nervous system: Alert and oriented. No focal neurological deficits. Extremities: No clubbing,  or cyanosis. No edema. Skin: No rashes, lesions or ulcers. Psychiatry: Judgement and insight appear minimally impaired. Mood & affect appropriate.   Data Reviewed:   I have personally reviewed following labs and imaging studies:  Labs: Basic Metabolic Panel:  Recent Labs Lab 01/19/17 1603 01/19/17 2342 01/20/17 0206 01/21/17 0237 01/22/17 0327 01/23/17 0312  NA 140  --  139 138 134* 133*  K 3.6  --  3.8  4.0 4.2 4.0  CL 105  --  101 100* 94* 94*  CO2 28  --  '24 30 29 31  '$ GLUCOSE 129*  --  178* 130* 149* 149*  BUN 13  --  15 21* 23* 29*  CREATININE 0.78 0.87 0.80 0.90 0.88 1.10  CALCIUM 9.0  --  9.0 8.8* 9.2 9.1   GFR Estimated Creatinine Clearance: 69.4 mL/min (by C-G formula based on SCr of 1.1 mg/dL). Liver Function Tests:  Recent Labs Lab 01/19/17 1603  AST 29  ALT 26  ALKPHOS 39  BILITOT 1.8*  PROT 6.5  ALBUMIN 3.8    CBC:  Recent Labs Lab 01/19/17 1603 01/23/17 0312  WBC 5.8 10.3  NEUTROABS 4.1  --   HGB 10.1* 10.5*  HCT 32.8* 33.9*  MCV 99.4 98.3  PLT 158 190   Cardiac Enzymes:  Recent Labs Lab 01/19/17 1603 01/19/17 2342 01/20/17 0206 01/20/17 0816  TROPONINI <0.03 <0.03 <0.03 <0.03   Sepsis Labs:  Recent Labs Lab 01/19/17 1603 01/19/17 1614 01/23/17 0312  WBC 5.8  --  10.3  LATICACIDVEN  --  1.15  --     Microbiology Recent Results (from the past 240 hour(s))  MRSA PCR Screening     Status: None   Collection Time: 01/20/17  4:52 AM  Result Value Ref Range Status   MRSA by PCR NEGATIVE NEGATIVE Final    Comment:        The GeneXpert MRSA Assay (FDA approved for NASAL specimens only), is one component of a comprehensive MRSA colonization surveillance program. It is not intended to diagnose MRSA infection nor to guide or monitor treatment for MRSA infections.     Radiology: Dg Chest Port 1 View  Result Date: 01/22/2017 CLINICAL DATA:  CHF EXAM: PORTABLE CHEST 1 VIEW COMPARISON:  01/19/2017 FINDINGS: Bilateral emphysematous changes primarily involving the upper lobes. Inter social prominence of bilateral lung bases. No focal consolidation, pleural effusion or pneumothorax. Stable cardiomediastinal silhouette. No acute osseous abnormality. IMPRESSION: No acute cardiopulmonary disease. Emphysema. ([id number]) Electronically Signed   By: Kathreen Devoid   On: 01/22/2017 08:32    Medications:   . aspirin  81 mg Oral QHS  . budesonide   0.25 mg Nebulization BID  . enoxaparin (LOVENOX) injection  40 mg Subcutaneous Q24H  . furosemide  80 mg Intravenous BID  . guaiFENesin  600 mg Oral BID  . ipratropium-albuterol  3 mL Nebulization QID  . irbesartan  75 mg Oral Daily  . isosorbide mononitrate  30 mg Oral BID  . levofloxacin (LEVAQUIN) IV  750 mg Intravenous Q24H  . mouth rinse  15 mL Mouth Rinse BID  . multivitamin with minerals  1 tablet Oral Daily  . omega-3 acid ethyl esters  1 g Oral Daily  . potassium chloride  40 mEq Oral Daily  . predniSONE  40 mg Oral Q breakfast  . simvastatin  20 mg Oral q1800  . sodium chloride flush  3 mL Intravenous Q12H   Continuous Infusions:  Medical decision making is of high complexity and this patient is at high risk of deterioration, therefore this is a level 3 visit.  (> 4 problem points, 2 data points, Severe exacerbation of chronic illness: high risk)   LOS: 4 days   Nastasia Kage  [facility] Pager [phone]. If unable to reach me by pager, please call my cell phone at [phone].  *Please refer to [email or web address], password [email or web address] to get updated schedule on who will round on this patient, as hospitalists switch teams weekly. If 7PM-7AM, please contact night-coverage at [email or web address], password [email or web address] for any overnight needs.  01/23/2017, 7:56 AM

## 2017-01-24 ENCOUNTER — Ambulatory Visit: Payer: Medicare Other | Admitting: Pulmonary Disease

## 2017-01-24 DIAGNOSIS — J449 Chronic obstructive pulmonary disease, unspecified: Secondary | ICD-10-CM

## 2017-01-24 DIAGNOSIS — I420 Dilated cardiomyopathy: Secondary | ICD-10-CM

## 2017-01-24 DIAGNOSIS — J9622 Acute and chronic respiratory failure with hypercapnia: Secondary | ICD-10-CM

## 2017-01-24 DIAGNOSIS — I5033 Acute on chronic diastolic (congestive) heart failure: Secondary | ICD-10-CM

## 2017-01-24 DIAGNOSIS — J9621 Acute and chronic respiratory failure with hypoxia: Secondary | ICD-10-CM

## 2017-01-24 LAB — BASIC METABOLIC PANEL
Anion gap: 10 (ref 5–15)
BUN: 29 mg/dL — ABNORMAL HIGH (ref 6–20)
CO2: 28 mmol/L (ref 22–32)
CREATININE: 0.98 mg/dL (ref 0.61–1.24)
Calcium: 9 mg/dL (ref 8.9–10.3)
Chloride: 93 mmol/L — ABNORMAL LOW (ref 101–111)
GFR calc Af Amer: >60 mL/min (ref >60)
GLUCOSE: 151 mg/dL — AB (ref 65–99)
Potassium: 4.4 mmol/L (ref 3.5–5.1)
Sodium: 131 mmol/L — ABNORMAL LOW (ref 135–145)

## 2017-01-24 MED ORDER — FUROSEMIDE 40 MG PO TABS
40.0000 mg | ORAL_TABLET | Freq: Two times a day (BID) | ORAL | Status: DC
  Administered 2017-01-24 – 2017-01-26 (×4): 40 mg via ORAL
  Filled 2017-01-24 (×4): qty 1

## 2017-01-24 MED ORDER — GUAIFENESIN ER 600 MG PO TB12
1200.0000 mg | ORAL_TABLET | Freq: Two times a day (BID) | ORAL | Status: DC
  Administered 2017-01-24 – 2017-01-26 (×4): 1200 mg via ORAL
  Filled 2017-01-24 (×4): qty 2

## 2017-01-24 MED ORDER — LEVOFLOXACIN 750 MG PO TABS
750.0000 mg | ORAL_TABLET | Freq: Every day | ORAL | Status: DC
  Administered 2017-01-24 – 2017-01-25 (×2): 750 mg via ORAL
  Filled 2017-01-24 (×2): qty 1

## 2017-01-24 NOTE — Progress Notes (Signed)
Progress Note    [name]  DDU:[id number] DOB: 04-Aug-1946  DOA: 01/19/2017 PCP: Andrew Hatchet, MD    Brief Narrative:   Chief complaint: Dyspnea  [name] is an 71 y.o. male with a PMH of chronic respiratory failure on 6 L of oxygen, and nocturnal BiPAP, COPD, cardiogenic pulmonary edema, chronic diastolic CHF, history of nonsustained ventricular tachycardia, and coronary artery disease who was admitted 01/19/17 for evaluation of a 24-hour history of worsening dyspnea.  Assessment/Plan:   Principal Problem:   Acute on chronic diastolic CHF (congestive heart failure) (HCC)/Dilated cardiomyopathy/OSA treated with BiPAP Currently on 80 mg of Lasix every 12 hours. Repeat 2-D echocardiogram showed: EF 55-60%, no RWMA. Last echocardiogram done 09/29/16 and showed an EF of 60-65 percent with grade 1 diastolic dysfunction. Troponins negative. Patient was placed on BiPAP in the ED, weaned off the following morning.  Admission and follow-up Chest x-rays are pictured below, improvement seen. I/O balance -2.4 L overnight. Creatinine stable with diuresis. Feels weak today. Would avoid beta blocker given his advanced COPD with exacerbation.     Active Problems:   Obesity (BMI 30-39.9) Body mass index is 36.82 kg/m.     Essential hypertension Continue Lasix.    Coronary atherosclerosis of native coronary artery Continue aspirin. Continue isosorbide. Continue fish oil.    COPD mixed type (HCC)/pulmonary hypertension Continue bronchodilators, Pulmicort, oxygen, Levaquin and prednisone 40 mg daily.    Acute on chronic respiratory failure with hypoxia and hypercapnia (HCC) Multifactorial with advanced COPD and diastolic CHF triggering. Continue to treat both COPD exacerbation and diastolic CHF. Continue nocturnal CPAP.   Family Communication/Anticipated D/C date and plan/Code Status   DVT prophylaxis: Lovenox ordered. Code Status: Full Code.  Family Communication: Wife  at bedside. Disposition Plan: Home when stable.   Medical Consultants:    Pulmonoloy   Procedures:    None  Anti-Infectives:    Levaquin 01/21/17--->  Subjective:   Feels weak today. Still coughing up purulent sputum, very thick and dark yellow to green in color. Appetite remains good. No chest pain.   Objective:    Vitals:   01/23/17 2343 01/24/17 0000 01/24/17 0331 01/24/17 0434  BP: (!) 146/67 137/81 137/81 126/81  Pulse: 72 77 70 76  Resp: (!) 34 19 18 (!) 21  Temp: 97.3 F (36.3 C)   97.6 F (36.4 C)  TempSrc: Axillary   Oral  SpO2: 97% 99% 97% 98%  Weight:    102.6 kg (226 lb 1.6 oz)  Height:        Intake/Output Summary (Last 24 hours) at 01/24/17 0738 Last data filed at 01/23/17 2310  Gross per 24 hour  Intake              390 ml  Output             2800 ml  Net            -2410 ml   Filed Weights   01/22/17 0404 01/23/17 0400 01/24/17 0434  Weight: 102.4 kg (225 lb 11.2 oz) 103.5 kg (228 lb 1.6 oz) 102.6 kg (226 lb 1.6 oz)    Exam: General exam: Appears calm and comfortable. Sitting up. Respiratory system: Diminished. Respiratory effort mildly increased with pursed lipped breathing. No wheeze or crackles. Cardiovascular system: S1 & S2 heard, RRR. No JVD,  rubs, gallops or clicks. No murmurs. Gastrointestinal system: Abdomen is obese, soft and nontender. No organomegaly or masses felt. Normal bowel sounds heard.  Central nervous system: Alert and oriented. No focal neurological deficits. Extremities: No clubbing,  or cyanosis. No edema. Skin: No rashes, lesions or ulcers. Psychiatry: Judgement and insight appear minimally impaired. Mood & affect appropriate.   Data Reviewed:   I have personally reviewed following labs and imaging studies:  Labs: Basic Metabolic Panel:  Recent Labs Lab 01/20/17 0206 01/21/17 0237 01/22/17 0327 01/23/17 0312 01/24/17 0247  NA 139 138 134* 133* 131*  K 3.8 4.0 4.2 4.0 4.4  CL 101 100* 94* 94* 93*  CO2  '24 30 29 31 28  '$ GLUCOSE 178* 130* 149* 149* 151*  BUN 15 21* 23* 29* 29*  CREATININE 0.80 0.90 0.88 1.10 0.98  CALCIUM 9.0 8.8* 9.2 9.1 9.0   GFR Estimated Creatinine Clearance: 77.5 mL/min (by C-G formula based on SCr of 0.98 mg/dL). Liver Function Tests:  Recent Labs Lab 01/19/17 1603  AST 29  ALT 26  ALKPHOS 39  BILITOT 1.8*  PROT 6.5  ALBUMIN 3.8    CBC:  Recent Labs Lab 01/19/17 1603 01/23/17 0312  WBC 5.8 10.3  NEUTROABS 4.1  --   HGB 10.1* 10.5*  HCT 32.8* 33.9*  MCV 99.4 98.3  PLT 158 190   Cardiac Enzymes:  Recent Labs Lab 01/19/17 1603 01/19/17 2342 01/20/17 0206 01/20/17 0816  TROPONINI <0.03 <0.03 <0.03 <0.03   Sepsis Labs:  Recent Labs Lab 01/19/17 1603 01/19/17 1614 01/23/17 0312  WBC 5.8  --  10.3  LATICACIDVEN  --  1.15  --     Microbiology Recent Results (from the past 240 hour(s))  MRSA PCR Screening     Status: None   Collection Time: 01/20/17  4:52 AM  Result Value Ref Range Status   MRSA by PCR NEGATIVE NEGATIVE Final    Comment:        The GeneXpert MRSA Assay (FDA approved for NASAL specimens only), is one component of a comprehensive MRSA colonization surveillance program. It is not intended to diagnose MRSA infection nor to guide or monitor treatment for MRSA infections.     Radiology: No results found.  Medications:   . aspirin  81 mg Oral QHS  . budesonide  0.25 mg Nebulization BID  . enoxaparin (LOVENOX) injection  40 mg Subcutaneous Q24H  . furosemide  80 mg Intravenous BID  . guaiFENesin  600 mg Oral BID  . ipratropium-albuterol  3 mL Nebulization QID  . irbesartan  75 mg Oral Daily  . isosorbide mononitrate  30 mg Oral BID  . levofloxacin (LEVAQUIN) IV  750 mg Intravenous Q24H  . mouth rinse  15 mL Mouth Rinse BID  . multivitamin with minerals  1 tablet Oral Daily  . omega-3 acid ethyl esters  1 g Oral Daily  . potassium chloride  40 mEq Oral Daily  . predniSONE  40 mg Oral Q breakfast  .  simvastatin  20 mg Oral q1800  . sodium chloride flush  3 mL Intravenous Q12H   Continuous Infusions:  Medical decision making is of high complexity and this patient is at high risk of deterioration, therefore this is a level 3 visit.  (> 4 problem points, 2 data points, Severe exacerbation of chronic illness: high risk)   LOS: 5 days   [name]  [facility] Pager [phone]. If unable to reach me by pager, please call my cell phone at [phone].  *Please refer to [email or web address], password [email or web address] to get updated schedule on who will round on this patient, as hospitalists switch teams weekly.  If 7PM-7AM, please contact night-coverage at [email or web address], password [email or web address] for any overnight needs.  01/24/2017, 7:38 AM

## 2017-01-24 NOTE — Progress Notes (Signed)
Physical Therapy Treatment Patient Details Name: [name] MRN: [id number] DOB: 1945/11/24 Today's Date: 01/24/2017    History of Present Illness Pt is a 71 y/o male admitted secondary to acute respiratory failure requiring BiPAP. PMH including but not limited to COPD (on 4-6L of O2 at all times), CHF, CAD and HTN.     PT Comments    Patient ambulated increased distance today with improved activity tolerated >300 ft on 6 liters with multiple rest breaks. Desaturation to 86% but rebounded well. Will continue to see and progress as tolerated.   Follow Up Recommendations  No PT follow up     Equipment Recommendations  None recommended by PT    Recommendations for Other Services       Precautions / Restrictions Precautions Precautions: None Restrictions Weight Bearing Restrictions: No    Mobility  Bed Mobility               General bed mobility comments: pt sitting EOB when PT entered room  Transfers Overall transfer level: Needs assistance Equipment used: None Transfers: Sit to/from Stand Sit to Stand: Supervision         General transfer comment: supervision for safety  Ambulation/Gait Ambulation/Gait assistance: Supervision Ambulation Distance (Feet): 380 Feet Assistive device: None Gait Pattern/deviations: Step-through pattern;Decreased stride length;Wide base of support Gait velocity: decreased   General Gait Details: steady with ambulation 2 standing rest breaks, ambulate don 6 liters [place] with desaturation to 86%, improved with rest and pursed lip breathing.   Stairs            Wheelchair Mobility    Modified Rankin (Stroke Patients Only)       Balance Overall balance assessment: Needs assistance Sitting-balance support: Feet supported Sitting balance-Leahy Scale: Good     Standing balance support: During functional activity;No upper extremity supported Standing balance-Leahy Scale: Fair                       Cognition Arousal/Alertness: Awake/alert Behavior During Therapy: WFL for tasks assessed/performed Overall Cognitive Status: Within Functional Limits for tasks assessed                      Exercises      General Comments        Pertinent Vitals/Pain      Home Living Family/patient expects to be discharged to:: Private residence Living Arrangements: Spouse/significant other Available Help at Discharge: Family;Available 24 hours/day Type of Home: House Home Access: Stairs to enter Entrance Stairs-Rails: Right;Left;Can reach both Home Layout: One level Home Equipment: Environmental consultant - 2 wheels      Prior Function Level of Independence: Independent          PT Goals (current goals can now be found in the care plan section) Acute Rehab PT Goals Patient Stated Goal: return home PT Goal Formulation: With patient Time For Goal Achievement: 02/05/17 Potential to Achieve Goals: Good Progress towards PT goals: Progressing toward goals    Frequency    Min 3X/week      PT Plan Current plan remains appropriate    Co-evaluation             End of Session Equipment Utilized During Treatment: Gait belt Activity Tolerance: Patient tolerated treatment well;Other (comment) (self-limiting with distance ambulated) Patient left: in bed;with call bell/phone within reach Nurse Communication: Mobility status PT Visit Diagnosis: Other abnormalities of gait and mobility (R26.89)     Time: 6213-0865 PT Time Calculation (min) (  ACUTE ONLY): 18 min  Charges:  $Gait Training: 8-22 mins                    G Codes:       Duncan Dull 02/13/17, 4:54 PM Alben Deeds, [place] DPT  [phone]

## 2017-01-24 NOTE — Consult Note (Signed)
Name: [name] MRN: [id number] DOB: 09-16-1946    ADMISSION DATE:  01/19/2017 CONSULTATION DATE:  01/24/2017  REFERRING MD :  [facility] - Rama  CHIEF COMPLAINT:  SOB  BRIEF PATIENT DESCRIPTION: 71 year old active smoker who presents to the hospital with SOB.  Patient has history of COPD that is O2 dependent at home who has hypercarbic respiratory failure and required BiPAP at home with some improvement.  Patient was admitted to the hospital where a repeat echo was done and revealed resolution of the grade one diastolic dysfunction.  Patient was told that he had CHF yesterday and evidently became very upset and demanded that he sees pulmonary.  Patient currently feels at baseline.  Sputum is clearing and quantity is improving.  SIGNIFICANT EVENTS  01/19/2017 admission for heart failure.  STUDIES:  CXR with no acute disease, I reviewed myself.   HISTORY OF PRESENT ILLNESS:  71 year old active smoker who presents to the hospital with SOB.  Patient has history of COPD that is O2 dependent at home who has hypercarbic respiratory failure and required BiPAP at home with some improvement.  Patient was admitted to the hospital where a repeat echo was done and revealed resolution of the grade one diastolic dysfunction.  Patient was told that he had CHF yesterday and evidently became very upset and demanded that he sees pulmonary.  Patient currently feels at baseline. Sputum is clearing and quantity is improving.  PAST MEDICAL HISTORY :   has a past medical history of Anxiety; Bladder cancer ([place]) (2011); Borderline diabetes mellitus; CAD (coronary artery disease); Cigarette smoker; COPD (chronic obstructive pulmonary disease) ([place]); DJD (degenerative joint disease); Emphysema of lung ([place]); Epistaxis; History of colonic polyps (2004); History of sinusitis; Hypercholesteremia; Hypertension; and Obesity.  has a past surgical history that includes cataract surgery/sub posterior vitrectomy in left eye  (1996); cataract surgery (septemeber 2013); and Mitral valve replacement [phone]). Prior to Admission medications   Medication Sig Start Date End Date Taking? Authorizing Provider  albuterol (PROVENTIL HFA;VENTOLIN HFA) 108 (90 BASE) MCG/ACT inhaler Inhale 2 puffs into the lungs 4 (four) times daily as needed for wheezing (for use when not at home / unable to use nebulizer). 10/29/15  Yes Cherene Altes, MD  Albuterol Sulfate (PROAIR RESPICLICK) 009 (90 Base) MCG/ACT AEPB Inhale 2 puffs into the lungs every 6 (six) hours as needed. Patient taking differently: Inhale 2 puffs into the lungs every 6 (six) hours as needed (shortness of breath).  05/05/16  Yes Noralee Space, MD  ALPRAZolam Duanne Moron) 0.5 MG tablet Take 0.5 mg by mouth at bedtime.    Yes Historical Provider, MD  aspirin 81 MG tablet Take 81 mg by mouth at bedtime.    Yes Historical Provider, MD  budesonide (PULMICORT) 0.25 MG/2ML nebulizer solution Take 2 mLs (0.25 mg total) by nebulization 2 (two) times daily. 03/16/16  Yes Noralee Space, MD  Flaxseed, Linseed, (FLAXSEED OIL) 1000 MG CAPS Take 1 capsule by mouth daily.     Yes Historical Provider, MD  furosemide (LASIX) 40 MG tablet Take 40 mg by mouth 2 (two) times daily.   Yes Historical Provider, MD  ibuprofen (ADVIL,MOTRIN) 600 MG tablet Take 600 mg by mouth every 6 (six) hours as needed for moderate pain.   Yes Historical Provider, MD  ipratropium-albuterol (DUONEB) 0.5-2.5 (3) MG/3ML SOLN Take 3 mLs by nebulization 4 (four) times daily. 03/16/16  Yes Noralee Space, MD  isosorbide mononitrate (IMDUR) 30 MG 24 hr tablet  Take 1 tablet (30 mg total) by mouth daily. 03/16/16  Yes Scott T Kathlen Mody, PA-C  metoprolol succinate (TOPROL-XL) 25 MG 24 hr tablet TAKE 2 TABLETS BY MOUTH IN THE MORNING AND 1 TABLET BY MOUTH IN THE THE EVENING   Yes Historical Provider, MD  Multiple Vitamin (MULTIVITAMIN) capsule Take 1 capsule by mouth daily.     Yes Historical Provider, MD  nitroGLYCERIN  (NITROSTAT) 0.4 MG SL tablet Place 1 tablet (0.4 mg total) under the tongue every 5 (five) minutes as needed for chest pain. 11/19/15  Yes Liliane Shi, PA-C  Omega-3 Fatty Acids (FISH OIL) 1200 MG CAPS Take 1 capsule by mouth daily.     Yes Historical Provider, MD  simvastatin (ZOCOR) 20 MG tablet Take 1 tablet (20 mg total) by mouth daily at 6 PM. 11/19/15  Yes Scott T Kathlen Mody, PA-C  sodium chloride (OCEAN) 0.65 % SOLN nasal spray Place 1 spray into both nostrils as needed for congestion. 10/29/15  Yes Cherene Altes, MD   Allergies  Allergen Reactions  . Amlodipine Swelling    FAMILY HISTORY:  family history includes Aneurysm in his brother; Cancer in his brother; Heart disease in his brother and mother. SOCIAL HISTORY:  reports that he quit smoking about 15 months ago. His smoking use included Cigarettes. He has a 90.00 pack-year smoking history. He has never used smokeless tobacco. He reports that he drinks alcohol. He reports that he does not use drugs.  REVIEW OF SYSTEMS:   Constitutional: Negative for fever, chills, weight loss, malaise/fatigue and diaphoresis.  HENT: Negative for hearing loss, ear pain, nosebleeds, congestion, sore throat, neck pain, tinnitus and ear discharge.   Eyes: Negative for blurred vision, double vision, photophobia, pain, discharge and redness.  Respiratory: Negative for cough, hemoptysis, sputum production, shortness of breath, wheezing and stridor.   Cardiovascular: Negative for chest pain, palpitations, orthopnea, claudication, leg swelling and PND.  Gastrointestinal: Negative for heartburn, nausea, vomiting, abdominal pain, diarrhea, constipation, blood in stool and melena.  Genitourinary: Negative for dysuria, urgency, frequency, hematuria and flank pain.  Musculoskeletal: Negative for myalgias, back pain, joint pain and falls.  Skin: Negative for itching and rash.  Neurological: Negative for dizziness, tingling, tremors, sensory change, speech change,  focal weakness, seizures, loss of consciousness, weakness and headaches.  Endo/Heme/Allergies: Negative for environmental allergies and polydipsia. Does not bruise/bleed easily.  SUBJECTIVE:   VITAL SIGNS: Temp:  [97.3 F (36.3 C)-98.7 F (37.1 C)] 98.4 F (36.9 C) (03/12 0843) Pulse Rate:  [70-96] 89 (03/12 0843) Resp:  [18-34] 23 (03/12 0843) BP: (118-146)/(64-81) 118/75 (03/12 0843) SpO2:  [90 %-99 %] 94 % (03/12 1108) FiO2 (%):  [40 %] 40 % (03/12 0331) Weight:  [102.6 kg (226 lb 1.6 oz)] 102.6 kg (226 lb 1.6 oz) (03/12 0434)  PHYSICAL EXAMINATION: General:  Chronically ill appearing male, NAD on 6L [place] Neuro:  Alert and interactive, moving all ext to command. HEENT:  [place]/AT, PERRL, EOM-I and MMM Cardiovascular:  RRR, NL S1/S2, -M/R/G. Lungs:  Very quite bilaterally with some air movement mostly at the bases. Abdomen:  Soft, NT, ND and +BS. Musculoskeletal:  -edema and -tenderness Skin:  Intact   Recent Labs Lab 01/22/17 0327 01/23/17 0312 01/24/17 0247  NA 134* 133* 131*  K 4.2 4.0 4.4  CL 94* 94* 93*  CO2 '29 31 28  '$ BUN 23* 29* 29*  CREATININE 0.88 1.10 0.98  GLUCOSE 149* 149* 151*    Recent Labs Lab 01/19/17 1603 01/23/17 7893  HGB 10.1* 10.5*  HCT 32.8* 33.9*  WBC 5.8 10.3  PLT 158 190   No results found.  ASSESSMENT / PLAN:  71 year old male with ES-COPD that is O2 (4-6L) dependence and BiPAP dependence who presents with a COPD exacerbation.  Patient is improving with Levaquin PO and IV lasix.  Discussed with PCCM-NP.  COPD:  - Prednisone 20 mg PO daily  - Duonebs scheduled  - Pulmicort scheduled  - Begin steroids taper  Pulmonary edema: I believe patient is at dry weight.  - Change lasix to 40 mg PO BID (home dose)   Bronchitis:  - Recommend change from IV to PO levofloxacin  Service recovery:  - Extensive discussion with patient and family, explained to him the difference between right and left heart failure and why the night RN stated the  heart failure and cleared misconceptions.  Right heart failure:  - Lasix  - Treat hypoxemia  - Continue ARB  - May consider seeing a cardiologist as outpatient  Recommend changing all to PO today and if continues to do well then would recommend discharge with f/u as outpatient with PCCM and cardiology.  Rush Farmer, M.D. [facility] Pulmonary/Critical Care Medicine. Pager: [phone]. After hours pager: [phone].  01/24/2017, 11:33 AM

## 2017-01-24 NOTE — Progress Notes (Signed)
Pharmacy Antibiotic Note  [name] is a 71 y.o. male admitted on 01/19/2017 with pneumonia.  Pharmacy has been consulted for Levaquin dosing.  Today is day #4.  Plan: 1. Continue Levaquin 750 mg q 24 hrs, change to oral dosing today. 2. Watch renal function. 3. Can Levaquin be d/c'd soon?  Height: '5\' 6"'$  (167.6 cm) Weight: 226 lb 1.6 oz (102.6 kg) IBW/kg (Calculated) : 63.8  Temp (24hrs), Avg:97.8 F (36.6 C), Min:97.3 F (36.3 C), Max:98.4 F (36.9 C)   Recent Labs Lab 01/19/17 1603 01/19/17 1614  01/20/17 0206 01/21/17 0237 01/22/17 0327 01/23/17 0312 01/24/17 0247  WBC 5.8  --   --   --   --   --  10.3  --   CREATININE 0.78  --   < > 0.80 0.90 0.88 1.10 0.98  LATICACIDVEN  --  1.15  --   --   --   --   --   --   < > = values in this interval not displayed.  Estimated Creatinine Clearance: 77.5 mL/min (by C-G formula based on SCr of 0.98 mg/dL).    Allergies  Allergen Reactions  . Amlodipine Swelling    Antimicrobials this admission:  Levaquin 3/9 >>  Dose adjustments this admission:   Microbiology results:  * BCx: * * UCx: * 3/8 Sputum:  ip 3/8 MRSA PCR: neg  Thank you for allowing pharmacy to be a part of this patient's care.  Uvaldo Rising, BCPS  Clinical Pharmacist Pager [phone]  01/24/2017 1:58 PM

## 2017-01-25 NOTE — Progress Notes (Signed)
Physical Therapy Treatment Patient Details Name: [name] MRN: [id number] DOB: 1946-04-30 Today's Date: 01/25/2017    History of Present Illness Pt is a 71 y/o male admitted secondary to acute respiratory failure requiring BiPAP. PMH including but not limited to COPD (on 4-6L of O2 at all times), CHF, CAD and HTN.     PT Comments    Patient seen for mobility progression, tolerated increased ambulation distance today with standing rest breaks intermittently.  Patient shows good self awareness of respiratory function and understanding of when to take a break and work on breathing and rest. Saturations to 87% on 6 liters with increased activity, rebounds >90% quickly. Will continue to see and progress as tolerated.  Follow Up Recommendations  No PT follow up     Equipment Recommendations  None recommended by PT    Recommendations for Other Services       Precautions / Restrictions Precautions Precautions: None Restrictions Weight Bearing Restrictions: No    Mobility  Bed Mobility               General bed mobility comments: pt sitting EOB when PT entered room  Transfers Overall transfer level: Needs assistance Equipment used: None Transfers: Sit to/from Stand Sit to Stand: Supervision         General transfer comment: supervision for safety  Ambulation/Gait Ambulation/Gait assistance: Supervision Ambulation Distance (Feet): 440 Feet Assistive device: None Gait Pattern/deviations: Step-through pattern;Decreased stride length;Wide base of support Gait velocity: decreased   General Gait Details: 3 standing rest breaks, increased distance today saturations dropped to low of 87% on 6 liters with increased activity.    Stairs            Wheelchair Mobility    Modified Rankin (Stroke Patients Only)       Balance Overall balance assessment: Needs assistance Sitting-balance support: Feet supported Sitting balance-Leahy Scale: Good      Standing balance support: During functional activity;No upper extremity supported Standing balance-Leahy Scale: Fair                      Cognition Arousal/Alertness: Awake/alert Behavior During Therapy: WFL for tasks assessed/performed Overall Cognitive Status: Within Functional Limits for tasks assessed                      Exercises      General Comments        Pertinent Vitals/Pain Pain Assessment: No/denies pain    Home Living                      Prior Function            PT Goals (current goals can now be found in the care plan section) Acute Rehab PT Goals Patient Stated Goal: return home PT Goal Formulation: With patient Time For Goal Achievement: 02/05/17 Potential to Achieve Goals: Good Progress towards PT goals: Progressing toward goals    Frequency    Min 3X/week      PT Plan Current plan remains appropriate    Co-evaluation             End of Session Equipment Utilized During Treatment: Gait belt Activity Tolerance: Patient tolerated treatment well;Other (comment) (self-limiting with distance ambulated) Patient left: in bed;with call bell/phone within reach Nurse Communication: Mobility status PT Visit Diagnosis: Other abnormalities of gait and mobility (R26.89)     Time: 2878-6767 PT Time Calculation (min) (ACUTE ONLY): 17 min  Charges:  $Gait Training: 8-22 mins                    G Codes:       Duncan Dull 02-11-17, 6:52 PM Alben Deeds, [place] DPT  [phone]

## 2017-01-25 NOTE — Progress Notes (Signed)
Progress Note    [name]  GPQ:[id number] DOB: Apr 01, 1946  DOA: 01/19/2017 PCP: [name], MD    Brief Narrative:   Chief complaint: Dyspnea  [name] is an 71 y.o. male with a PMH of chronic respiratory failure on 6 L of oxygen, and nocturnal BiPAP, COPD, cardiogenic pulmonary edema, chronic diastolic CHF, history of nonsustained ventricular tachycardia, and coronary artery disease who was admitted 01/19/17 for evaluation of a 24-hour history of worsening dyspnea.  Assessment/Plan:   Principal Problem:   Acute on chronic diastolic CHF (congestive heart failure) (HCC)/Dilated cardiomyopathy/OSA treated with BiPAP Diuresed through 01/24/17 with 80 mg of Lasix every 12 hours. Repeat 2-D echocardiogram showed: EF 55-60%, no RWMA. Last echocardiogram done 09/29/16 and showed an EF of 60-65 percent with grade 1 diastolic dysfunction. Troponins negative. Patient was placed on BiPAP in the ED, weaned off the following morning.  Admission and follow-up Chest x-rays are pictured below, improvement seen. I/O balance -1.8 L overnight. Creatinine stable with diuresis.  Already on an ARB. Would avoid beta blocker given his advanced COPD with exacerbation. Using less oxygen, and will likely be stable for discharge in the next 24-48 hours.     Active Problems:   Obesity (BMI 30-39.9) Body mass index is 36.82 kg/m.     Essential hypertension Continue Lasix, Avapro.    Coronary atherosclerosis of native coronary artery Continue aspirin. Continue isosorbide. Continue fish oil.    COPD mixed type (HCC)/pulmonary hypertension Continue bronchodilators, Pulmicort, oxygen, Levaquin and prednisone 40 mg daily.Evaluated by pulmonology 01/24/17. Continue Levaquin for a 5-7 day course and wean prednisone as tolerated.    Acute on chronic respiratory failure with hypoxia and hypercapnia (HCC) Multifactorial with advanced COPD and diastolic CHF triggering. Continue to treat both COPD  exacerbation and diastolic CHF. Continue nocturnal CPAP.  Family Communication/Anticipated D/C date and plan/Code Status   DVT prophylaxis: Lovenox ordered. Code Status: Full Code.  Family Communication: Wife at bedside. Disposition Plan: Home when stable, possibly in the next 24-48 hours.   Medical Consultants:    Pulmonoloy   Procedures:    None  Anti-Infectives:    Levaquin 01/21/17--->  Subjective:   Beginning to feel stronger, and is using less oxygen. Still coughing up purulent sputum, which is tinged with blood at times.   Objective:    Vitals:   01/25/17 0400 01/25/17 0441 01/25/17 0758 01/25/17 0800  BP: (!) 144/78 134/66 126/76 123/75  Pulse: 72 64 83 80  Resp: 18 (!) 21 (!) 21 17  Temp:  97.4 F (36.3 C) 98.6 F (37 C)   TempSrc:  Oral Oral   SpO2: 98% 97% 92% 95%  Weight:  103.3 kg (227 lb 12.8 oz)    Height:        Intake/Output Summary (Last 24 hours) at 01/25/17 0912 Last data filed at 01/25/17 0900  Gross per 24 hour  Intake              960 ml  Output             2830 ml  Net            -1870 ml   Filed Weights   01/23/17 0400 01/24/17 0434 01/25/17 0441  Weight: 103.5 kg (228 lb 1.6 oz) 102.6 kg (226 lb 1.6 oz) 103.3 kg (227 lb 12.8 oz)    Exam: General exam: Appears calm and comfortable. Sitting up. Respiratory system: Diminished. Respiratory effort mildly increased with pursed lipped  breathing. No wheeze or crackles. Cardiovascular system: S1 & S2 heard, RRR. No JVD,  rubs, gallops or clicks. No murmurs. Gastrointestinal system: Abdomen is obese, soft and nontender. No organomegaly or masses felt. Normal bowel sounds heard. Central nervous system: Alert and oriented. No focal neurological deficits. Extremities: No clubbing,  or cyanosis. No edema. Skin: No rashes, lesions or ulcers. Psychiatry: Judgement and insight appear minimally impaired. Mood & affect appropriate.   Data Reviewed:   I have personally reviewed following labs  and imaging studies:  Labs: Basic Metabolic Panel:  Recent Labs Lab 01/20/17 0206 01/21/17 0237 01/22/17 0327 01/23/17 0312 01/24/17 0247  NA 139 138 134* 133* 131*  K 3.8 4.0 4.2 4.0 4.4  CL 101 100* 94* 94* 93*  CO2 '24 30 29 31 28  '$ GLUCOSE 178* 130* 149* 149* 151*  BUN 15 21* 23* 29* 29*  CREATININE 0.80 0.90 0.88 1.10 0.98  CALCIUM 9.0 8.8* 9.2 9.1 9.0   GFR Estimated Creatinine Clearance: 77.8 mL/min (by C-G formula based on SCr of 0.98 mg/dL). Liver Function Tests:  Recent Labs Lab 01/19/17 1603  AST 29  ALT 26  ALKPHOS 39  BILITOT 1.8*  PROT 6.5  ALBUMIN 3.8    CBC:  Recent Labs Lab 01/19/17 1603 01/23/17 0312  WBC 5.8 10.3  NEUTROABS 4.1  --   HGB 10.1* 10.5*  HCT 32.8* 33.9*  MCV 99.4 98.3  PLT 158 190   Cardiac Enzymes:  Recent Labs Lab 01/19/17 1603 01/19/17 2342 01/20/17 0206 01/20/17 0816  TROPONINI <0.03 <0.03 <0.03 <0.03   Sepsis Labs:  Recent Labs Lab 01/19/17 1603 01/19/17 1614 01/23/17 0312  WBC 5.8  --  10.3  LATICACIDVEN  --  1.15  --     Microbiology Recent Results (from the past 240 hour(s))  MRSA PCR Screening     Status: None   Collection Time: 01/20/17  4:52 AM  Result Value Ref Range Status   MRSA by PCR NEGATIVE NEGATIVE Final    Comment:        The GeneXpert MRSA Assay (FDA approved for NASAL specimens only), is one component of a comprehensive MRSA colonization surveillance program. It is not intended to diagnose MRSA infection nor to guide or monitor treatment for MRSA infections.     Radiology: No results found.  Medications:   . aspirin  81 mg Oral QHS  . budesonide  0.25 mg Nebulization BID  . enoxaparin (LOVENOX) injection  40 mg Subcutaneous Q24H  . furosemide  40 mg Oral BID  . guaiFENesin  1,200 mg Oral BID  . ipratropium-albuterol  3 mL Nebulization QID  . irbesartan  75 mg Oral Daily  . isosorbide mononitrate  30 mg Oral BID  . levofloxacin  750 mg Oral Daily  . mouth rinse  15  mL Mouth Rinse BID  . multivitamin with minerals  1 tablet Oral Daily  . omega-3 acid ethyl esters  1 g Oral Daily  . potassium chloride  40 mEq Oral Daily  . predniSONE  40 mg Oral Q breakfast  . simvastatin  20 mg Oral q1800  . sodium chloride flush  3 mL Intravenous Q12H   Continuous Infusions:  Medical decision making is of high complexity and this patient is at high risk of deterioration, therefore this is a level 3 visit.  (> 4 problem points, 2 data points, Severe exacerbation of chronic illness: high risk)   LOS: 6 days   Mariette Cowley  [facility] Pager [phone]. If  unable to reach me by pager, please call my cell phone at [phone].  *Please refer to [email or web address], password [email or web address] to get updated schedule on who will round on this patient, as hospitalists switch teams weekly. If 7PM-7AM, please contact night-coverage at [email or web address], password [email or web address] for any overnight needs.  01/25/2017, 9:12 AM

## 2017-01-25 NOTE — Progress Notes (Signed)
Name: [name] MRN: [id number] DOB: 1946/07/09    ADMISSION DATE:  01/19/2017 CONSULTATION DATE:  01/24/2017  REFERRING MD :  Dr. Rockne Menghini  CHIEF COMPLAINT:  AECOPD  BRIEF PATIENT DESCRIPTION:  71 year old active smoker with COPD (4-6L home oxygen) who presents to the hospital on 3/7 with acute dyspnea. PCCM was consulted for AECOPD on 3/12.    SIGNIFICANT EVENTS  3/7 > Admitted to Dyspnea  3/12 > Patient request pulmonary consult   STUDIES:  3/7 CXR > Interval development of diffuse interstitial prominence in the bilateral lower lungs, differential considerations include pulmonary edema, and an atypical or viral respiratory infection, acute pneumonitis is possible, upper lung predominant emphysema 3/10 CXR > No acute, Bilateral emphysematous changes in the upper lobes, inter social prominence of bilateral lung bases, no focal consolidation or pleural effusion  SUBJECTIVE:  Report that he is mostly back to his baseline. No events overnight.   VITAL SIGNS: Temp:  [97.4 F (36.3 C)-98.6 F (37 C)] 98.6 F (37 C) (03/13 0758) Pulse Rate:  [64-98] 80 (03/13 0800) Resp:  [17-22] 17 (03/13 0800) BP: (107-155)/(53-78) 123/75 (03/13 0800) SpO2:  [87 %-98 %] 95 % (03/13 0917) FiO2 (%):  [40 %] 40 % (03/12 2312) Weight:  [103.3 kg (227 lb 12.8 oz)] 103.3 kg (227 lb 12.8 oz) (03/13 0441)  PHYSICAL EXAMINATION: General:  Adult male, no distress, sitting on edge of bed  Neuro:  Alert, oriented, grossly intact  HEENT:  Normocephalic  Cardiovascular:  RRR, no MRG Lungs:  Diminished breath sounds, no wheeze/crackles,non-labored  Abdomen:  Obese, active bowel sounds  Musculoskeletal:  No acute  Skin:  Warm, dry, intact    Recent Labs Lab 01/22/17 0327 01/23/17 0312 01/24/17 0247  NA 134* 133* 131*  K 4.2 4.0 4.4  CL 94* 94* 93*  CO2 '29 31 28  '$ BUN 23* 29* 29*  CREATININE 0.88 1.10 0.98  GLUCOSE 149* 149* 151*    Recent Labs Lab 01/19/17 1603 01/23/17 0312  HGB 10.1*  10.5*  HCT 32.8* 33.9*  WBC 5.8 10.3  PLT 158 190   No results found.  ASSESSMENT / PLAN:  Acute on Chronic Hypoxic Respiratory Failure secondary to AECOPD +pulmonary edema and Bronchitis  Baseline 4-6L [place] and BIPAP at HS  Plan  -Maintain Oxygen Saturation >88 -Continue Prednisone Taper  -Continue Pulmicort and scheduled Duonebs  -Pulmonary Hygiene  -Should be ready for D/C in 24 hours  -Will see in clinic in 1-2 weeks   Pulmonary Edema secondary to right heart failure  Plan -Continue home Lasix 40 mg BID  -Consider outpatient follow up with Cardiology   PCCM will sign off. If needed again please re-consult   [name], AG-ACNP [place] [facility]  Pgr: [phone]  PCCM Pgr: [phone]   STAFF NOTE: Linwood Dibbles, MD FACP have personally reviewed patient's available data, including medical history, events of note, physical examination and test results as part of my evaluation. I have discussed with resident/NP and other care providers such as pharmacist, RN and RRT. In addition, I personally evaluated patient and elicited key findings of: no sig wheezing, moving air posterior well, anterior slight reduced BS, abdo soft, no edema, he remains SOB with exertion that he rerports is worse then baseline. He is at baseline at rest, I reviewed all pasr pcxr and most recent, no infiltrates noted, chronic bibasilar changes and I think the last pcxr is over penetrated, would limit abx at day 5 and  dc, pred slight reduction, ambulatory pulse ox in am to ensure when rwady to go home he can have sats acceptale with home 4-6 liters, will sign off, he shold see Dr Lenna Gilford as outpt in 2 weeks,  I udpated pt in full   Lavon Paganini. Titus Mould, MD, [place] Pgr: [place] [facility] 01/25/2017 1:23 PM

## 2017-01-26 LAB — CREATININE, SERUM
CREATININE: 1 mg/dL (ref 0.61–1.24)
GFR calc Af Amer: >60 mL/min (ref >60)
GFR calc non Af Amer: >60 mL/min (ref >60)

## 2017-01-26 MED ORDER — PREDNISONE 20 MG PO TABS: ORAL_TABLET | ORAL | 0 refills | Status: DC

## 2017-01-26 MED ORDER — IRBESARTAN 75 MG PO TABS: 75.0000 mg | ORAL_TABLET | Freq: Every day | ORAL | 0 refills | Status: DC

## 2017-01-26 MED ORDER — ALBUTEROL SULFATE (2.5 MG/3ML) 0.083% IN NEBU: 2.5000 mg | INHALATION_SOLUTION | RESPIRATORY_TRACT | 0 refills | Status: DC | PRN

## 2017-01-26 MED ORDER — POTASSIUM CHLORIDE CRYS ER 20 MEQ PO TBCR: 20.0000 meq | EXTENDED_RELEASE_TABLET | Freq: Every day | ORAL | 0 refills | Status: DC

## 2017-01-26 NOTE — Progress Notes (Signed)
Patient given discharge instructions and scripts. Verbalizes understanding. Patient taken to car via wheelchair.

## 2017-01-26 NOTE — Discharge Instructions (Signed)
Acute Respiratory Failure, Adult ° °Acute respiratory failure occurs when there is not enough oxygen passing from your lungs to your body. When this happens, your lungs have trouble removing carbon dioxide from the blood. This causes your blood oxygen level to drop too low as carbon dioxide builds up. °Acute respiratory failure is a medical emergency. It can develop quickly, but it is temporary if treated promptly. Your lung capacity, or how much air your lungs can hold, may improve with time, exercise, and treatment. °What are the causes? °There are many possible causes of acute respiratory failure, including: °· Lung injury. °· Chest injury or damage to the ribs or tissues near the lungs. °· Lung conditions that affect the flow of air and blood into and out of the lungs, such as pneumonia, acute respiratory distress syndrome, and cystic fibrosis. °· Medical conditions, such as strokes or spinal cord injuries, that affect the muscles and nerves that control breathing. °· Blood infection (sepsis). °· Inflammation of the pancreas (pancreatitis). °· A blood clot in the lungs (pulmonary embolism). °· A large-volume blood transfusion. °· Burns. °· Near-drowning. °· Seizure. °· Smoke inhalation. °· Reaction to medicines. °· Alcohol or drug overdose. °What increases the risk? °This condition is more likely to develop in people who have: °· A blocked airway. °· Asthma. °· A condition or disease that damages or weakens the muscles, nerves, bones, or tissues that are involved in breathing. °· A serious infection. °· A health problem that blocks the unconscious reflex that is involved in breathing, such as hypothyroidism or sleep apnea. °· A lung injury or trauma. °What are the signs or symptoms? °Trouble breathing is the main symptom of acute respiratory failure. Symptoms may also include: °· Rapid breathing. °· Restlessness or anxiety. °· Skin, lips, or fingernails that appear blue (cyanosis). °· Rapid heart  rate. °· Abnormal heart rhythms (arrhythmias). °· Confusion or changes in behavior. °· Tiredness or loss of energy. °· Feeling sleepy or having a loss of consciousness. °How is this diagnosed? °Your health care provider can diagnose acute respiratory failure with a medical history and physical exam. During the exam, your health care provider will listen to your heart and check for crackling or wheezing sounds in your lungs. Your may also have tests to confirm the diagnosis and determine what is causing respiratory failure. These tests may include: °· Measuring the amount of oxygen in your blood (pulse oximetry). The measurement comes from a small device that is placed on your finger, earlobe, or toe. °· Other blood tests to measure blood gases and to look for signs of infection. °· Sampling your cerebral spinal fluid or tracheal fluid to check for infections. °· Chest X-[place] to look for fluid in spaces that should be filled with air. °· Electrocardiogram (ECG) to look at the heart's electrical activity. °How is this treated? °Treatment for this condition usually takes places in a hospital intensive care unit (ICU). Treatment depends on what is causing the condition. It may include one or more treatments until your symptoms improve. Treatment may include: °· Supplemental oxygen. Extra oxygen is given through a tube in the nose, a face mask, or a hood. °· A device such as a continuous positive airway pressure (CPAP) or bi-level positive airway pressure (BiPAP or BPAP) machine. This treatment uses mild air pressure to keep the airways open. A mask or other device will be placed over your nose or mouth. A tube that is connected to a motor will deliver oxygen through the   mask.  Ventilator. This treatment helps move air into and out of the lungs. This may be done with a bag and mask or a machine. For this treatment, a tube is placed in your windpipe (trachea) so air and oxygen can flow to the lungs.  Extracorporeal  membrane oxygenation (ECMO). This treatment temporarily takes over the function of the heart and lungs, supplying oxygen and removing carbon dioxide. ECMO gives the lungs a chance to recover. It may be used if a ventilator is not effective.  Tracheostomy. This is a procedure that creates a hole in the neck to insert a breathing tube.  Receiving fluids and medicines.  Rocking the bed to help breathing. Follow these instructions at home:  Take over-the-counter and prescription medicines only as told by your health care provider.  Return to normal activities as told by your health care provider. Ask your health care provider what activities are safe for you.  Keep all follow-up visits as told by your health care provider. This is important. How is this prevented? Treating infections and medical conditions that may lead to acute respiratory failure can help prevent the condition from developing. Contact a health care provider if:  You have a fever.  Your symptoms do not improve or they get worse. Get help right away if:  You are having trouble breathing.  You lose consciousness.  Your have cyanosis or turn blue.  You develop a rapid heart rate.  You are confused. These symptoms may represent a serious problem that is an emergency. Do not wait to see if the symptoms will go away. Get medical help right away. Call your local emergency services (911 in the [place] Do not drive yourself to the hospital. This information is not intended to replace advice given to you by your health care provider. Make sure you discuss any questions you have with your health care provider. Document Released: 11/06/2013 Document Revised: 05/29/2016 Document Reviewed: 05/19/2016 Elsevier Interactive Patient Education  2017 [employer].   Heart Failure  Heart failure means your heart has trouble pumping blood. This makes it hard for your body to work well. Heart failure is usually a long-term (chronic)  condition. You must take good care of yourself and follow your doctor's treatment plan. Follow these instructions at home:  Take your heart medicine as told by your doctor.  Do not stop taking medicine unless your doctor tells you to.  Do not skip any dose of medicine.  Refill your medicines before they run out.  Take other medicines only as told by your doctor or pharmacist.  Stay active if told by your doctor. The elderly and people with severe heart failure should talk with a doctor about physical activity.  Eat heart-healthy foods. Choose foods that are without trans fat and are low in saturated fat, cholesterol, and salt (sodium). This includes fresh or frozen fruits and vegetables, fish, lean meats, fat-free or low-fat dairy foods, whole grains, and high-fiber foods. Lentils and dried peas and beans (legumes) are also good choices.  Limit salt if told by your doctor.  Cook in a healthy way. Roast, grill, broil, bake, poach, steam, or stir-fry foods.  Limit fluids as told by your doctor.  Weigh yourself every morning. Do this after you pee (urinate) and before you eat breakfast. Write down your weight to give to your doctor.  Take your blood pressure and write it down if your doctor tells you to.  Ask your doctor how to check your pulse. Check  your pulse as told.  Lose weight if told by your doctor.  Stop smoking or chewing tobacco. Do not use gum or patches that help you quit without your doctor's approval.  Schedule and go to doctor visits as told.  Nonpregnant women should have no more than 1 drink a day. Men should have no more than 2 drinks a day. Talk to your doctor about drinking alcohol.  Stop illegal drug use.  Stay current with shots (immunizations).  Manage your health conditions as told by your doctor.  Learn to manage your stress.  Rest when you are tired.  If it is really hot outside:  Avoid intense activities.  Use air conditioning or fans, or  get in a cooler place.  Avoid caffeine and alcohol.  Wear loose-fitting, lightweight, and light-colored clothing.  If it is really cold outside:  Avoid intense activities.  Layer your clothing.  Wear mittens or gloves, a hat, and a scarf when going outside.  Avoid alcohol.  Learn about heart failure and get support as needed.  Get help to maintain or improve your quality of life and your ability to care for yourself as needed. Contact a doctor if:  You gain weight quickly.  You are more short of breath than usual.  You cannot do your normal activities.  You tire easily.  You cough more than normal, especially with activity.  You have any or more puffiness (swelling) in areas such as your hands, feet, ankles, or belly (abdomen).  You cannot sleep because it is hard to breathe.  You feel like your heart is beating fast (palpitations).  You get dizzy or light-headed when you stand up. Get help right away if:  You have trouble breathing.  There is a change in mental status, such as becoming less alert or not being able to focus.  You have chest pain or discomfort.  You faint. This information is not intended to replace advice given to you by your health care provider. Make sure you discuss any questions you have with your health care provider. Document Released: 08/10/2008 Document Revised: 04/08/2016 Document Reviewed: 12/18/2012 Elsevier Interactive Patient Education  2017 [employer].

## 2017-01-26 NOTE — Care Management Note (Signed)
Case Management Note  Patient Details  Name: [name] MRN: [id number] Date of Birth: 01-06-1946  Subjective/Objective:     Pt admitted with acute on chronic HF              Action/Plan:  Pt independent from home with wife - has walker and cane but does not use it.  Has been on supplemental oxygen for approximately 1 year 4-6 liters with [employer] - confirmed he has working BIPAP in the home and uses it every night.  Pt states he does weighs himself everyday and never adds salt to food - CM educated pt on daily weights and continuing to adhere to low salt diet.  Pt is in agreement with [facility] for disease management - Cm requested order via physician sticky note.  Pt remains on IV lasix - CM will continue to follow for discharge needs.  Pt has portable oxygen tank her for transport home   Expected Discharge Date:                  Expected Discharge Plan:  [place]  In-House Referral:     Discharge planning Services  CM Consult  Post Acute Care Choice:    Choice offered to:  Patient  DME Arranged:    DME Agency:     HH Arranged:  RN, Disease Management [place] Agency:  [facility]  Status of Service:  Completed, signed off  If discussed at [place] of Stay Meetings, dates discussed:    Additional Comments: 01/26/2017 Pt discharging home today with wife.  Pt has portable oxygen tank in hospital room ready for transport at home , confirmed working BIPAP in the home.  CM offered choice for [facility] - pt chose [place] - agency contacted and accepted referral.    01/21/17 CM contacted HF liason and requested assessment [name], RN 01/26/2017, 11:05 AM

## 2017-01-26 NOTE — Discharge Summary (Signed)
DISCHARGE SUMMARY  JOMO FORAND  MR#: [id number]  DOB:[birth year]  Date of Admission: 01/19/2017 Date of Discharge: 01/26/2017  Attending Physician:MCCLUNG,JEFFREY T  Patient's UQJ:[id number], Nicki Reaper, MD  Consults:  PCCM  Disposition: D/C home   Follow-up Appts: Follow-up Information    NADEL,SCOTT M, MD Follow up on 01/27/2017.   Specialty:  Pulmonary Disease Why:  Appt at 11:00 AM Contact information: [place] [address] [phone]        [facility] Follow up.   Specialty:  Home Health Services Why:  registered nurse  Contact information: [address] [place] [address] [phone]           Tests Needing Follow-up: -recheck of K+ is indicated   Discharge Diagnoses: Acute on chronic diastolic CHF (congestive heart failure)/Dilated cardiomyopathy/OSA treated with BiPAP Obesity (BMI 30-39.9) Essential hypertension Coronary atherosclerosis of native coronary artery COPD mixed type/pulmonary hypertension Acute on chronic respiratory failure with hypoxia and hypercapnia   Initial presentation: 71 y.o. male with a PMH of chronic respiratory failure on 6 L of oxygen, and nocturnal BiPAP, COPD, cardiogenic pulmonary edema, chronic diastolic CHF, history of nonsustained ventricular tachycardia, and coronary artery disease who was admitted 01/19/17 for evaluation of a 24-hour history of worsening dyspnea.  Hospital Course:    Acute on chronic diastolic CHF/Dilated cardiomyopathy/OSA treated with BiPAP Diuresed with 80 mg of Lasix every 12 hours. Repeat 2-D echocardiogram showed: EF 55-60%, no RWMA. Last echocardiogram done 09/29/16 and showed an EF of 60-65 percent with grade 1 diastolic dysfunction. Troponins negative. Patient was placed on BiPAP in the ED, weaned off the following morning.  Follow-up Chest x-ray noted improvement. I/O balance -~13 L for admit. Creatinine stable with diuresis.  Already on an ARB. Avoid beta  blocker given his advanced COPD with exacerbation. Stable on home O2 regimen at time of d/c.    Obesity (BMI 30-39.9) Body mass index is 36.82 kg/m  Essential hypertension Continue Lasix, Avapro - BP controlled at time of d/c home   Coronary atherosclerosis of native coronary artery Continue aspirin. Continue isosorbide. Continue fish oil. No chest pain during hospital stay.    COPD mixed type/pulmonary hypertension Continue bronchodilators, Pulmicort, oxygen, and prednisone 40 mg daily.Evaluated by pulmonology 01/24/17. Continue Levaquin for a 5-7 day course and wean prednisone as tolerated.  No wheezing at time of exam, and pt feels he is back to his baseline resp status.    Acute on chronic respiratory failure with hypoxia and hypercapnia Multifactorial with advanced COPD and diastolic CHF triggering. Acute exacerbation resolved at time of d/c.  Continue nocturnal CPAP at home as per usual regimen.     Allergies as of 01/26/2017      Reactions   Amlodipine Swelling      Medication List    STOP taking these medications   Albuterol Sulfate 108 (90 Base) MCG/ACT Aepb Commonly known as:  PROAIR RESPICLICK Replaced by:  albuterol (2.5 MG/3ML) 0.083% nebulizer solution You also have another medication with the same name that you need to continue taking as instructed.   metoprolol succinate 25 MG 24 hr tablet Commonly known as:  TOPROL-XL     TAKE these medications   albuterol 108 (90 Base) MCG/ACT inhaler Commonly known as:  PROVENTIL HFA;VENTOLIN HFA Inhale 2 puffs into the lungs 4 (four) times daily as needed for wheezing (for use when not at home / unable to use nebulizer). What changed:  Another medication with the same name was  added. Make sure you understand how and when to take each.  Another medication with the same name was removed. Continue taking this medication, and follow the directions you see here.   albuterol (2.5 MG/3ML) 0.083% nebulizer  solution Commonly known as:  PROVENTIL Take 3 mLs (2.5 mg total) by nebulization every 2 (two) hours as needed for wheezing or shortness of breath. What changed:  You were already taking a medication with the same name, and this prescription was added. Make sure you understand how and when to take each. Replaces:  Albuterol Sulfate 108 (90 Base) MCG/ACT Aepb   ALPRAZolam 0.5 MG tablet Commonly known as:  XANAX Take 0.5 mg by mouth at bedtime.   aspirin 81 MG tablet Take 81 mg by mouth at bedtime.   budesonide 0.25 MG/2ML nebulizer solution Commonly known as:  PULMICORT Take 2 mLs (0.25 mg total) by nebulization 2 (two) times daily.   Fish Oil 1200 MG Caps Take 1 capsule by mouth daily.   Flaxseed Oil 1000 MG Caps Take 1 capsule by mouth daily.   furosemide 40 MG tablet Commonly known as:  LASIX Take 40 mg by mouth 2 (two) times daily.   ibuprofen 600 MG tablet Commonly known as:  ADVIL,MOTRIN Take 600 mg by mouth every 6 (six) hours as needed for moderate pain.   ipratropium-albuterol 0.5-2.5 (3) MG/3ML Soln Commonly known as:  DUONEB Take 3 mLs by nebulization 4 (four) times daily.   irbesartan 75 MG tablet Commonly known as:  AVAPRO Take 1 tablet (75 mg total) by mouth daily. Start taking on:  01/27/2017   isosorbide mononitrate 30 MG 24 hr tablet Commonly known as:  IMDUR Take 1 tablet (30 mg total) by mouth daily.   multivitamin capsule Take 1 capsule by mouth daily.   nitroGLYCERIN 0.4 MG SL tablet Commonly known as:  NITROSTAT Place 1 tablet (0.4 mg total) under the tongue every 5 (five) minutes as needed for chest pain.   potassium chloride SA 20 MEQ tablet Commonly known as:  K-DUR,KLOR-CON Take 1 tablet (20 mEq total) by mouth daily. Start taking on:  01/27/2017   predniSONE 20 MG tablet Commonly known as:  DELTASONE Take 2 tablets a day for 4 days, then 1 tablet a day for 4 days, then half a tablet for 4 days, then stop   simvastatin 20 MG  tablet Commonly known as:  ZOCOR Take 1 tablet (20 mg total) by mouth daily at 6 PM.   sodium chloride 0.65 % Soln nasal spray Commonly known as:  OCEAN Place 1 spray into both nostrils as needed for congestion.       Day of Discharge BP 120/71 (BP Location: Left Arm)   Pulse 77   Temp 98.1 F (36.7 C) (Oral)   Resp 19   Ht '5\' 6"'$  (1.676 m)   Wt 101.8 kg (224 lb 6.9 oz)   SpO2 97%   BMI 36.22 kg/m   Physical Exam: General: No acute respiratory distress on 4.5LPM [place] O2 Lungs: Clear to auscultation bilaterally without wheezes or crackles - very distant breath sounds Cardiovascular: Regular rate and rhythm without murmur - very distant heart sounds  Abdomen: Nontender, nondistended, soft, bowel sounds positive, no rebound, no ascites, no appreciable mass Extremities: No significant cyanosis, clubbing, edema bilateral lower extremities  Basic Metabolic Panel:  Recent Labs Lab 01/20/17 0206 01/21/17 0237 01/22/17 0327 01/23/17 0312 01/24/17 0247 01/26/17 0942  NA 139 138 134* 133* 131*  --   K 3.8  4.0 4.2 4.0 4.4  --   CL 101 100* 94* 94* 93*  --   CO2 '24 30 29 31 28  '$ --   GLUCOSE 178* 130* 149* 149* 151*  --   BUN 15 21* 23* 29* 29*  --   CREATININE 0.80 0.90 0.88 1.10 0.98 1.00  CALCIUM 9.0 8.8* 9.2 9.1 9.0  --     Liver Function Tests:  Recent Labs Lab 01/19/17 1603  AST 29  ALT 26  ALKPHOS 39  BILITOT 1.8*  PROT 6.5  ALBUMIN 3.8    CBC:  Recent Labs Lab 01/19/17 1603 01/23/17 0312  WBC 5.8 10.3  NEUTROABS 4.1  --   HGB 10.1* 10.5*  HCT 32.8* 33.9*  MCV 99.4 98.3  PLT 158 190    Cardiac Enzymes:  Recent Labs Lab 01/19/17 1603 01/19/17 2342 01/20/17 0206 01/20/17 0816  TROPONINI <0.03 <0.03 <0.03 <0.03   BNP (last 3 results)  Recent Labs  09/24/16 0011 01/19/17 1603  BNP 29.3 35.2    Recent Results (from the past 240 hour(s))  MRSA PCR Screening     Status: None   Collection Time: 01/20/17  4:52 AM  Result Value Ref Range  Status   MRSA by PCR NEGATIVE NEGATIVE Final    Comment:        The GeneXpert MRSA Assay (FDA approved for NASAL specimens only), is one component of a comprehensive MRSA colonization surveillance program. It is not intended to diagnose MRSA infection nor to guide or monitor treatment for MRSA infections.      Time spent in discharge (includes decision making & examination of pt): 35 minutes  01/26/2017, 11:20 AM   Cherene Altes, MD [facility] Office  [phone] Pager [phone]  On-Call/Text Page:      [email or web address]      password [facility]

## 2017-01-27 ENCOUNTER — Encounter: Payer: Self-pay | Admitting: Pulmonary Disease

## 2017-01-27 ENCOUNTER — Ambulatory Visit (INDEPENDENT_AMBULATORY_CARE_PROVIDER_SITE_OTHER): Payer: Medicare Other | Admitting: Pulmonary Disease

## 2017-01-27 VITALS — BP 132/60 | HR 68 | Ht 66.0 in | Wt 226.8 lb

## 2017-01-27 DIAGNOSIS — I5033 Acute on chronic diastolic (congestive) heart failure: Secondary | ICD-10-CM

## 2017-01-27 DIAGNOSIS — J449 Chronic obstructive pulmonary disease, unspecified: Secondary | ICD-10-CM

## 2017-01-27 DIAGNOSIS — I272 Pulmonary hypertension, unspecified: Secondary | ICD-10-CM | POA: Diagnosis not present

## 2017-01-27 DIAGNOSIS — I420 Dilated cardiomyopathy: Secondary | ICD-10-CM

## 2017-01-27 DIAGNOSIS — C679 Malignant neoplasm of bladder, unspecified: Secondary | ICD-10-CM | POA: Diagnosis not present

## 2017-01-27 DIAGNOSIS — J9612 Chronic respiratory failure with hypercapnia: Secondary | ICD-10-CM

## 2017-01-27 DIAGNOSIS — Z9989 Dependence on other enabling machines and devices: Secondary | ICD-10-CM

## 2017-01-27 DIAGNOSIS — I2510 Atherosclerotic heart disease of native coronary artery without angina pectoris: Secondary | ICD-10-CM

## 2017-01-27 DIAGNOSIS — I10 Essential (primary) hypertension: Secondary | ICD-10-CM | POA: Diagnosis not present

## 2017-01-27 DIAGNOSIS — R7301 Impaired fasting glucose: Secondary | ICD-10-CM

## 2017-01-27 DIAGNOSIS — M150 Primary generalized (osteo)arthritis: Secondary | ICD-10-CM | POA: Diagnosis not present

## 2017-01-27 DIAGNOSIS — M159 Polyosteoarthritis, unspecified: Secondary | ICD-10-CM

## 2017-01-27 DIAGNOSIS — J9611 Chronic respiratory failure with hypoxia: Secondary | ICD-10-CM | POA: Diagnosis not present

## 2017-01-27 DIAGNOSIS — E669 Obesity, unspecified: Secondary | ICD-10-CM | POA: Diagnosis not present

## 2017-01-27 MED ORDER — LEVOFLOXACIN 500 MG PO TABS: ORAL_TABLET | ORAL | 2 refills | Status: DC

## 2017-01-27 NOTE — Patient Instructions (Signed)
Today we updated your med list in our EPIC system...    Continue your current medications the same...  Your breathing meds are the same as before>    Use the NEBULIZER w/ DUONEB 4 times daily     Use the NEBULIZER w/ Budesonide twice daily...  Finish out the PREDNISONE taper as outlined by the Hospitalist...  We wrote for an antibiotic -- LEVAQUIN '500mg'$  one tab daily for 10d to take at the 1st sign of infection...    Be sure to take a probiotic like ALIGN daily while you are on this antibiotic...  Continue the [facility] '1200mg'$  twice daily w/ fluids...  Remember -- NO SALT...  Call for any questions...  Let's plan a follow up visit in 4-6 weeks for recheck off the Prednisone.[name]

## 2017-01-28 MED ORDER — BUDESONIDE 0.25 MG/2ML IN SUSP: 0.2500 mg | Freq: Two times a day (BID) | RESPIRATORY_TRACT | 11 refills | Status: DC

## 2017-01-28 MED ORDER — IPRATROPIUM-ALBUTEROL 0.5-2.5 (3) MG/3ML IN SOLN: 3.0000 mL | Freq: Four times a day (QID) | RESPIRATORY_TRACT | 11 refills | Status: DC

## 2017-01-29 ENCOUNTER — Encounter: Payer: Self-pay | Admitting: Pulmonary Disease

## 2017-01-29 DIAGNOSIS — I272 Pulmonary hypertension, unspecified: Secondary | ICD-10-CM | POA: Diagnosis not present

## 2017-01-29 DIAGNOSIS — E668 Other obesity: Secondary | ICD-10-CM | POA: Diagnosis not present

## 2017-01-29 DIAGNOSIS — F17210 Nicotine dependence, cigarettes, uncomplicated: Secondary | ICD-10-CM | POA: Diagnosis not present

## 2017-01-29 DIAGNOSIS — I110 Hypertensive heart disease with heart failure: Secondary | ICD-10-CM | POA: Diagnosis not present

## 2017-01-29 DIAGNOSIS — I5032 Chronic diastolic (congestive) heart failure: Secondary | ICD-10-CM | POA: Diagnosis not present

## 2017-01-29 DIAGNOSIS — I420 Dilated cardiomyopathy: Secondary | ICD-10-CM | POA: Diagnosis not present

## 2017-01-29 DIAGNOSIS — I2510 Atherosclerotic heart disease of native coronary artery without angina pectoris: Secondary | ICD-10-CM | POA: Diagnosis not present

## 2017-01-29 DIAGNOSIS — J9621 Acute and chronic respiratory failure with hypoxia: Secondary | ICD-10-CM | POA: Diagnosis not present

## 2017-01-29 DIAGNOSIS — J9622 Acute and chronic respiratory failure with hypercapnia: Secondary | ICD-10-CM | POA: Diagnosis not present

## 2017-01-29 DIAGNOSIS — I471 Supraventricular tachycardia: Secondary | ICD-10-CM | POA: Diagnosis not present

## 2017-01-29 DIAGNOSIS — J441 Chronic obstructive pulmonary disease with (acute) exacerbation: Secondary | ICD-10-CM | POA: Diagnosis not present

## 2017-01-29 DIAGNOSIS — G4733 Obstructive sleep apnea (adult) (pediatric): Secondary | ICD-10-CM | POA: Diagnosis not present

## 2017-01-29 NOTE — Progress Notes (Signed)
Subjective:    Patient ID: [name], male    DOB: 1946-07-19, 71 y.o.   MRN: [id number]  HPI 71 y/o WM here for a follow up visit and on-going management of mult med problems...  ~  SEE PREV EPIC NOTES FOR OLDER DATA >>    CXR 7/14 showed COPD/ emphysema, incr markings and scarring at the bases, NAD...  LABS 7/14:  FLP- ok on Simva20;  Chems- ok x BS=120 A1c=6.7;  CBC- wnl;  TSH=1.26;  PSA=0.41...  [name] has established Primary Care follow up w/ [name] at [facility]>>   CXR 7/15 showed underlying COPD, mild bibasilar fibrosis, norm heart size, DJD in spine, NAD...   PFT 7/15 showed> FVC=3.18 (78%), FEV1=1.22 (38%), %1sec=38; mid-flows=13% predicted... This is c/w GOLD Stage3 COPD and we discussed this...  ~  December 05, 2014:  72moROV & pulmonary recheck; [name] continued smoking, perhaps decr slightly to 1/2ppd but unable to quit & he has gained 6# in the interim up to 221# today;  His CC is sinusitis w/ nasal congestion, drainage & green mucus plus maxillary/ frontal HAs; he denies f/c/s, notes stable cough & chest symptoms w/ DOE;  He has been taking his Advair250Bid but prev refused addition of an anticholinergic due to "the donut hole";  States his breathing is "fine, except in cold air" ( we discussed wearing a scarf in winter etc)... He has GOLD Stage3 COPD w/ FEV1=1.22 (38%predicted) done 7/15;;  Exam shows decr BS bilat w/ few scat rhonchi & mild exp wheezing esp w/ forced maneuver...     We reviewed prob list, meds, xrays and labs> see below for updates >> [facility]... PLAN>> we discussed treating his acute max sinusitis w/ Augmentin875Bid, plus align daily & nasal saline as needed; he knows the importance of smoking cessation but is not motivated & declines smoking cessation help; he will continue the Advair250Bid 7 we are adding spiriva via handihaler once per day; we plan ROV in 627mo/ f/u CXR & PFT,  call in the interim for any breathing issues....   ~  June 05, 2015:  72m21moV & [name] a good interval, no new complaints or concerns; still smoking +/-1ppd and blames family stress; notes cough, sm amy yellow sput, no hemoptysis, SOB w/o change/stable- still mows 3 yards, does chores, tends to 20+chickens & their eggs, etc; he has repeatedly declined smoking cessation help, clearly not motivated to quit, not even doing his inhalers regularly!!!    COPD, +smoker> on Advair250Bid & Spiriva daily; symptoms as above, he is stoic, still smoking 1ppd & NOT motivated to quit, asked to cut back & use both inhalers regularly...    HBP> on Metop25Bid, Amlod10, Lisin40; BP=114/64 & he denies CP, palpit, SOB, edema...    CAD> on ASA81; followed by [name] & seen 4/15 (overdue for f/u)> CAD, IWMI 1995, mult PCIs, last Myoview 2013 was low risk; too sedentary, no changes made; 73 48o brother died w/ AAA rupture...    Chol> on Simva20; FLP 7/14 showed TChol 158, TG 145, HDL 33, LDL 96; needs better diet & wt reduction and ret for Fasting blood work overdue.    DM> on diet alone; LABS 7/14 showed BS= 120, A1c= 6.7; we reviewed low carb diet, exercise program, and wt reduction...    Obese> wt recorded as 204# today but it doesn't look like he's lost 26#; we reviewed diet &  exercise...    GI- Gastritis, polyps> prev on Zantac; prev colon 2004 w/ hyperplastic polyp removed & f/u 8/14 by DrStark w/ 3 polyps removed (5-60m size, all were tubular adenomas), +divertics in sigmoid, f/u planned 5 yrs... NOTE> exam showed a small palp knot in mid-abd above the umbilicus ?etiology (I offered CT Abd but he declines & wants to discuss w/ his primary- [name]).    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    DJD> on OTC analgesics  as needed...    Anxiety> on Alpraz0.18m57mrn... We reviewed prob list, meds, xrays and labs> see below for updates >>  NOTE> HE IS FOLLOWED BY [name] for GEN MED/ PCP now...  CXR 06/05/15 showed stable heart size, Ao atherosclerosis & tortuousiy, COPD/emphysema, incr markings at the bases, arthritis in Tspine...  LABS 7/16: pt asked to ret for fasting blood work... IMP/PLAN>>  [name] is stable but continues to smoke 1ppd; CXR w/ COPD/emphysema, atherosclerotic Ao, chronic changes but NAD- we discussed the low-dose screening CT Chest for the early detection of lung cancer (he is a candidate & he will consider participating in this program); in the meanwhile- continue Advair/ Spiriva regularly;  He is overdue for Cards f/u w/ [name], and needs to continue Q6mo29mo w/ [name] (w/ copy of notes to us);KoreaHe will ret for FASTING blood work.  ADDENDUM>> given his age and smoking history, [name] candidate for LUNG CANCER SCREENING w/ LOW-DOSE CT Chest>>  This office visit constitutes a face-to-face visit for the purpose of lung cancer screening counseling and a shared decision making visit...  Eligibility for LDCT screening:  1) Age=71 (must be 55-77y/o);  2) Absence of symptoms of lung cancer: he denies CP, ch in SOB, hemoptysis.  Smoking History:  1) He has 60+ pk-yrs (must have >30 pk-yr hx smoking);  2) Smoking status- still smoking ~1ppd (current or former smoker who quit<15 yrs ago).  Counseling:  Importance of smoking cessation &/or continued abstinence discussed ; offered smoking cessation interventions- pt declined; pt agreed to continued LDCT screening annually.  Shared decision making:  We reviewed the potential benefits and harms of screening> eg. Early detection of lung cancer and improved survival, the need for additional diagnostic tests & possible surgery, the risk of over-diagnosis/ false positives/ and radiation exposure... All questions were answered to the best  of my ability & he would like to consider the LDCT screening & he will let us kKoreaw his decision=> he ignored this discussion & never called.  ~  October 31, 2015:  5 month ROV & post hospital follow up visit>  [name] was Adm 12/5 - 10/29/15 by [facility] w/ acute hypoxemic resp failure precipitated by a COPD exac; for his part he noted incr SOB, cough, green sput, & "slowing down", he denied f/c/s, no CP, etc;  In the ER he was hypoxemic w/ O2sats in the 70s on RA and eval revealed> CXR- COPD, bullous dis, incr markings at bases, NAD; Cultures of blood/ sput/ urine were all neg; Serologies for strep & legionella were neg; Viral panel was neg;  LABs were OK (WBC=7.9=>14, Hg=15.3=>13.9, BS=100-140, BNP=498);  EKG showed NSR, rate78, NSSTTWA;  2DEcho 10/21/15 showed mildLVH, norm LVF w/ EF=55-60%, no regional wall motion abn, LA-mod  dil, RA- mod to severe dil, RV- mod dil, mod TR, PAsys-70mHg... He was treated w/ Oxygen, IV Solumedrol, Rocephin/ Zithromax, NEBS, Lovenox, flutter, etc; he was disch on Home O2- 2L/min activ & 1L/min rest, Pred taper, Levaquin, Advair250/ Spiriva...  He returns today feeling better, notes his home pulse-ox checks are improving & he has less SOB/DOE, less cough/sput, etc;  He says he has not smoked since PTA...      EXAM shows Afeb, VSS, O2sat=94% on 2L/min [place];  Wt=210# (he was 204 prev);  HEENT- neg, mallampati1;  Chest- decr BS bilat, few scat rhonchi, no w/r/consolidation;  Heart- RR w/o m/r/g;  Abd- obese, soft;  Ext- neg w/o c/c/e...  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    ~  November 21, 2015:  3wk ROV & MrWicker reports that he feels much better, SOB improved, cough improved, still prod of a sm amt green sput w/o blood w/o f/c/s;  He's been using the NEB w/ Albut TID followed by his AdvairBid & SpirivaQd; he is off the Pred now, he is too sedentary & not exercising => he needs PULM REHAB & we will refer, [facility] we will inquire as to what he might be elig for...       EXAM shows Afeb, VSS, O2sat=98% on 2L/min [place];  Wt=216# (he was 204-210# prev);  HEENT- neg, mallampati2;  Chest- decr BS bilat, otherw clear w/o w/r/r;  Heart- RR w/o m/r/g;  Abd- obese, soft;  Ext- neg w/o c/c/e...  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 We plan ROV in 6 wks.  ~  February 03, 2016:  2-376moOV & ErSharman Crateeturns w/ his severe obstructive lung dis, GOLD Stage 3, chr hypoxemic resp failure w/ pulmHTN (WHO group3); he quit smoking 10/2015 & is using home O2 3L/min, NEBS w/ Albut tid, Advair250Bid, Spiriva daily, Mucinex etc... He reports that breathing is stable overall but he is c/o 51m86mo URI w/ cough, thick yellow sput hard to produce, incr SOB 7 chest tightness but no fever, no CP=> he wants another antibiotic... He spent much time ventilating about his problems w/ DME[facility], his O2 set-up, & [facility] program- they still haven't contacted him about starting Rehab... He does have mult entries into EPIC from [employer] reviewed...     EXAM shows Afeb, VSS, O2sat=92% on 3L/min [place];  Wt=231# (up 15# & he was 204-210# prev);  HEENT- neg, mallampati2;  Chest- decr BS bilat, otherw clear w/o w/r/r;  Heart- RR w/o m/r/g;  Abd- obese, soft;  Ext- neg w/o c/c/e.  PFT 02/03/16>  FVC=2.57 (68%), FEV1=1.05 (38%), %1sec=41, mid-flows reduced at 18% predicted; post bronchodil  FEV1 w/o change;  TLC=6.67 (107%), RV=3.97 (176%), RV/TLC=60;  DLCO=22% and DL/VA=29%... This is c/w a severe obstructive ventilatory defect w/ air trapping and severely decr DLCO c/w emphysema- GOLD Stage  3 COPD... IMP/PLAN>>  We discussed continuing the NEBS Tid followed by Advair250Bid & Spiriva daily; add Mucinex 1279mBid & we will Rx w/ Levaquin500/d x7d for his bronchitic infection (see AVS);  He still needs a CT Chest but wants to wait for now;  [facility], and he wants diff DME supplier- we will inquire, plan ROV 378mo. ADDENDUM 03/18/16>> Pt unable to afford inhalers- ADVAIR & SPIRIVA discontinued and placed on NEBULIZER w/ DUONEB QID & PULMICORT 0.25 BID regularly (we will update med list to reflect this change).  ~  May 05, 2016:  54m78moV & [name] notes DOE w/ exertion- he is in PulHealth Netusing up to 6L/min w/ exercise (he tried Oxy[employer] 1 Automotiveusing 3-4L/min at rest & Qhs; he feels that he is making some progress & I reminded him the DIET + EXERCISE and wt loss would be very beneficial to his breathing; states he is walking & mowing on his off days (ie- when not in rehab); despite everything his wt continues to climb- now up 4# to 235# "exercise makes me hungry"... we reviewed the following medical problems during today's office visit >> his new PCP is [name]...    COPD, ex-smoker, chr hypoxemic RF> on NEB w/ Duoneb Qid + Budes-Bid + HFA prn (Advair & Spiriva too $$); he finally quit smoking, symptoms as above, he is now in pulm rehab but having issues w/ low sats...    HBP> on MetopER-78m51mM&1PM), Amlod10, Lisin10, Lasix40Bid; BP=136/66 & he denies CP, palpit, SOB, edema...    CAD> on ASA81, Imdur30; followed by [name] & seen 04/2016> CAD, IWMI 1995, mult PCIs, last Myoview 2013 was low risk; too sedentary, edema decr w/ Lasix; 73 y39 brother died w/ AAA rupture...    Chol> on Simva20; FLP 12/16 showed TChol 138, TG 75, HDL 25, LDL 98; needs better diet & wt  reduction and better compliance.    DM> on diet alone; LABS 5/17 showed BS= 118, A1c= per PCP; we reviewed low carb diet, exercise program, and wt reduction...    Obese> wt = 235#, 5'6"Tall, BMI=38;  we reviewed diet & exercise...    GI- Gastritis, polyps> prev on Zantac; prev colon 2004 w/ hyperplastic polyp removed & f/u 8/14 by DrStark w/ 3 polyps removed (5-7mm 39me, all were tubular adenomas), +divertics in sigmoid, f/u planned 5 yrs... NOTE> exam showed a small palp knot in mid-abd above the umbilicus ?etiology (I offered CT Abd but he declines & wants to discuss w/ his primary- [name]).    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     DJD> on OTC analgesics as needed...    Anxiety> on Alpraz0.5mg pr7m. EXAM shows Afeb, VSS, O2sat=92% on 3L/min [place];  Wt=231# (up 15# & he was 204-210# prev);  HEENT- neg, mallampati3;  Chest- decr BS bilat, otherw clear w/o w/r/r;  Heart- RR w/o m/r/g;  Abd- obese, soft;  Ext- neg w/o c/c/e. IMP/PLAN>>  [name] is rec to continue current meds regularly, get on diet & get wt down , continue in PulmRehab, etc; we wrote for a ProairHFA rescue inhaler for prn use...   ~  August 05, 2016:  54mo ROV59moast visit we rec continuing same meds but stressed DIET, EXERCISE, wt reduction-- unfortunately he  has GAINED 8# up to 243# (BMI=39) in the interval;  He states that "I don't get hungry, I only eat one sandwich & sometimes a salad at night;  He says "they kicked me out" of the [facility] program due to low oxygens even w/ use of the Oxymizer;  He says he is exercising at home now;  He has established PCP w/ [name] & apparently he did labs which pt reports were "OK".    [name] c/o cough, chest congestion, yellow sput, ans a "sinus infection" w/ yellow drainage; notes incr SOB but denies CP/  f/c/s, edema;   EXAM shows Afeb, VSS, O2sat=92% on 3L/min [place];  Wt=231# (up 15# & he was 204-210# prev);  HEENT- neg, mallampati3;  Chest- decr BS bilat, otherw clear w/o w/r/r;  Heart- RR w/o m/r/g;  Abd- obese, soft;  Ext- neg w/o c/c/e. IMP/PLAN>>  Murlin describes an upper resp infection & COPD exac=> Rx w/ Levaquin, Pred77m-4d taper, + his NEBS w/ duoneb Qid & Budes Bid; he desperately needs to get the weight down...  ~  October 13, 2016:  ETenochreturns and says "I've been worse" & indicates that he really likes the BiPAP he was placed on & disch with after his last Hosp 11/10 - 09/29/16; records reviewed by me- Adm w/ cough, yellow sput, chills and incr SOB; CXR revealed COPD, incr markings & bibasilar atx; Sput/ blood cultures and resp viral panel were all neg; LABS showed WBC~13K, anemia w/ Hg~11, Chems- ok x elev BS (his PCP is [name]); BNP was 29; ABGs showed pH=7.38, pCO2=58, pO2=79 on [place] O2; note that Bicarb level ~30;  2DEcho showed EF 60-65%, Gr1DD, PAsys~757mg;  He was felt to has a COPD exac, Cor Pulmonale- treated w/ BiPAP (he was INTOL to CPAP), given IV Solumedrol (DC on Pred taper), IV Zithromax, NEB treatments and improved...    He now returns & notes that he likes the BiPAP, feels like it is really helping (resting better, sleeps thru the night, wakes refreshed, no daytime sleepiness, & naps 2/7 days now) & wants to continue the BiPAP; NOTE that we had rec a sleep study to him on mult occas but he has always refused!  He now agrees to a sleep study, hoping that it will lead to medicare approval for his home BiPAP => HE QUALIFIES FOR BiPAP BASED ON COPD DIAGNOSIS w/ ABG SHOWING pCO2>52 ON HIS NASAL CANNULA O2, he needs the SLEEP OXIMETRY STUDY confirming O2sat<88% for cumulative >77m21mwhile on his prescribed O2, and OSA/CPAP treatment have been considered & ruled out... He has a home O2conc & a POC which he uses 3-4L pulse dose w/ his home sats in the 90s at rest & down to 88% w/  exerc...     COPD, mixed type w/ emphysema>  He has GOLD Stage3 COPD & on O2 as noted, NEBS w/ DUONEB Qid & BUDESONIDE0.25Bid, plus ProairHFA prn when out & about; off the Pred [facility]...    Ex-smoker>  He quit late in 2016...    Chronic resp failure w/ hypoxemia and hypercarbia>  On above meds + ASA, Metoprolol, Imdur, Lasix    Cor pulmonale, secondary pulm hypertension>  He is on O2 24/7 using 4L/min continuous at night & 4L/min pulse dose during the day...    CARDIAC issues>  HBP, CAD w/ IWMI 1995 & mult PCIs, HL (on Simva20)- all followed by [name]...    MEDICAL issues>  HBP,  HL, DM, obesity, gastritis, divertics, colon polyps, BPH, hx bladder cancer, DJD, anxiety EXAM shows Afeb, VSS, O2sat=97% on 4L/min [place];  Wt=240#;  HEENT- neg, mallampati3;  Chest- decr BS bilat, otherw clear w/o w/r/r;  Heart- RR w/o m/r/g;  Abd- obese, soft;  Ext- neg w/o c/c/e.  CXR 09/26/16>  Norm heart size, incr bibasilar atx... IMP/PLAN>>  Caesar needs to continue his O2, NEBS w/ Duoneb & Budes regularly; he wants to continue the BiPAP & we need to see if he meets Medicare's criteria for BiPAP device- proceed w/ Sleep Study...   ADDENDUM>>  Sleep Study 10/15/16>  AHI=1.8/hr (8 hypopneas & 4 RERAs;  AHI during REM = 17.6/hr;  Study done on 4LO2 & min O2sat= 88%;  Mod snoring noted;  No arrhythmias;  PLMS index= 37, but assoc arousals was only 0.2;  He appears to meet the criteria for BiPAP & we will submit to [employer]/ [employer]... Plan ROV recheck in 6-8 weeks.  ~  November 24, 2016:  6wks ROV and pulmonary follow up visit>  Drevon is stable w/ his severe disease- on BiPAP Qhs, Home O2- 24/7, NEB w/ Duoneb Qid & Budes Bid, he has rescue inhaler and [place] nasal mist;  His weight is all the way up to 257# (up 18# in 6wks) and c/o cough off & on, small amt yellow sput, no f/c/s, SOB "the same" but notes incr DOE w/ walking; unfortunately he has been very sedentary- not exercising at all  (prev in pulm rehab & they discharged him from the program)...     SEE PROBLEM LIST-- above... EXAM shows Afeb, VSS, O2sat=97% on 4L/min [place];  Wt=240#;  HEENT- neg, mallampati3;  Chest- decr BS bilat, otherw clear w/o w/r/r;  Heart- RR w/o m/r/g;  Abd- obese, soft;  Ext- neg w/o c/c/e.  LABS 11/24/16>  Chems- ok w/ BS=117, Cr=0.64, LFTs=wnl;  CBC- anemic w/ Hg=10.69mcv=95, wbc-norm;  Fe=191 (sat=56%), Ferritin=109;  TSH=1.28...  Abd SONAR => ordered but never done...  Last CT Chest 08/19/16> mild cardiomeg w/ aortic & coronary atherosclerotic changes & PA enlargement w/ 3.4cm outflow tract; no adenopathy; severe bullous emphysema; bilat pulm nodules w/ 7.64mm RUL nodule & 7.9 LLL lesion; Abd showssmall gallstones vs sludge & 6cm right renal cyst, mod thoracic spondylosis...  Last 2DEcho 09/29/16> mod conc LVH, norm LVF w/ EF=60-65%, no regional wall motion abn, Gr1 DD, norm valves, RV dil w/ norm sys function, PAsys=74 mmHg...  Last ONO > cannot find results IMP?PLAN>>  Aydn has severe COPD/emphysema w/ pulmHTN & CorPulmonale; he refuses to fill inhalers due to costs & he is therefore on O2- 24/7, BiPAP nightly, NEBS w/ Duoneb Qid & Budes Bid, Mucinex 1200Bid, & asked to get into a regular exercise program;  He will continue regular f/u w/ CARDS- [name]; must work on weight reduction...   ~  January 27, 2017:  70mo ROV & [name] was ADM by [facility] 3/7 - 01/26/17 w/ Acute on Chronic diastolic CHF, dilated cardiomyopathy, COPD exac, acute on chr resp failure;  He was diureses 30# of fluid 257# to 227# today but he is quite vehemently opposed to this being diastolicCHF and says it was pneumonia & wouldn't have happened if he had an antibiotic when he needed it (his words); Pulm consult from DrYacoub also remarked about pt being very upset at suggestion of CHF & demanded to see pulmonary...             Marland KitchenMarland Kitchen.   CXR 01/22/17 showed cardiomegaly, aortic atherosclerosis, bilat  emphysema & interstitial prominence in  the bases, no consolidation or effusion..  EKGs 01/2017 showed NSR, poor R prog V1-2, one PVC pair...  2DEcho 01/20/17 was a poor quality study  But showed mild conc LVH (prev mod), norm LVF w/ EF=55-60% & no RWMA, Ao root mildly dil, AoV mild calcif leaflets, MV norm, LAdil 84m, RV dil, norm RVF, PAsys- not meas (([id number]  LABS showed BS~130-180 range, Cr=0.8-1.1 range, Hg~10-11 range, WBC~10=>6K, Troponins- neg, BNP=35...    He was treated w/ [id number] placed on BiPAP, Oxygen, NEBS, Solumedrol, & empiric antibiotics; He was seen by Pulmonary & transitioned to oral Levaquin, Prednisone, & continued on his home regimen of Oxygen- 24/7, BiPAP Qhs, NEBS w/ DUONEB Qid & Budesonide Bid, Mucinex '1200mg'$  Bid; Lasix trimmed to '40mg'$ Bid at disch yest... Since disch he is c/o "weak", notes some cough, sput, & SOB; we discussed keeping an Ab on hand so he can take it when he wants at the earliest sign of a resp infection- Levaquin '500mg'$  daily #10 w/ refill written at his request, reminded to always take a probiotic like ALIGN & activia yogurt while on the broad spectrum antibiotics...     EXAM shows Afeb, VSS, O2sat=97% on 4L/min [place];  Wt=227#;  HEENT- neg, mallampati3;  Chest- decr BS bilat, otherw clear w/o w/r/r;  Heart- RR w/o m/r/g;  Abd- obese, soft;  Ext- neg w/o c/c/e. IMP/PLAN>>  EGraytonhas severe end-stage COPD- mixed chr bronchitis & emphysema w/ chr hypoxemia & hypercarbic resp failure, w/ Hx pulmHTN & right heart failure;  Now improved and approaching his baseline on max meds=> O2 at 2-4L/min 24/7 (he has [employer] that supplies 2L/min continuous or up to 6L/min pulse dose flow), nocturnal BiPAP, NEBs w/ Duoneb Qid & Budes 0.25Bid, Mucinex '1200mg'$ Bid, weaning oral Prednisone from his recent hosp, finished oral Levaquin; on ASA81, Lasix40Bid, Avapro75, Imdur30, K20, Zocor20, and off prev Metoprolol... We discussed continuing the Pred taper til gone, continue other meds the same for now & ROV recheck 160mo.  Note: he has been followed for Cards by [name] (HBP, CAD w/ IWMI 1995 & mult PCIs, HL) and last seen in 201`5 but he saw ScRichardson Dopp2 in 5-04/2016...           Problem List:  Hx of SINUSITIS (ICD-473.9) - off Nasonex now & recent Rx w/ Augmentin/ Saline... notes occas sinus congestion, drainage, sl HA, etc... he knows that he needs to quit smoking! ~  10/10: Adm for epistaxis w/ eval by DrWolicki- CXR= NAD, labs OK, rec for saline lavage- nose bleed resolved & disch home (no recurrence so far). ~  1/16: he presented w/ acute max & frontal sinusitis> Rx w/ Augmentin, Align, Saline, Mucinex, etc... ~  3/17: he has acute bronchitic exac- treated w/ Levaquin x7d + his regular meds...  COPD (ICD-496) >>   ~  on ADVAIR 250Bid; STILL SMOKES CIGARETTES & he's refused Chantix...  ~  He had hemoptysis 6/99 without lesion on CXR, CTChest, or Bronchoscopy... ~  Adm 10/10 w/ epistaxsis & eval DrWolicki as noted> he wanted to do Sleep Study but pt cancelled it & declined to resched. ~  CXR 5/11 showed COPD/ bullous emphysema, NAD...[name]~  CXR 5/12 showed ectatic calcif & tort Aorta, COPD w/ incr markings ar bases, NAD, +degen spondylosis. ~  CXR 6/13 showed COPD/ emphysema, scarring in the lower lobes, otherw clear lungs, DJD in spine... ~  CXR 7/14 showed COPD/ emphysema, incr markings and scarring at the bases, NAD. ~  1/15: [name] says he's decr his smoking from prev 3-4ppd down to 3 cig/d;  He remains on Advair250> states breathing is ok, at baseline CAT score=12; baseline cough, sput, no hemoptysis, stable DOE, no edema... ~  CXR 7/15 showed underlying COPD, mild bibasilar fibrosis, norm heart size, DJD in spine, NAD...  ~  PFT 7/15 showed> FVC=3.18 (78%), FEV1=1.22 (38%), %1sec=38; mid-flows=13% predicted... This is c/w GOLD Stage3 COPD and we discussed this> He is rec to quit all smoking immediately; he has been using the nicotine gum & rec to try patches, offered Wellbutrin, he refuses Chantix; needs  to continue ADVAIR250Bid & samples givem; he declines to start [place] at this time due to cost & donut hole (we will address this again on return); reminded to use MUCINEX '600mg'$ - 1to2 Bid w/ fluids, and OK to use OTC DELSYM as needed for cough...   ~  1/16: he claims stable on Advair250 but still smoking & can't vs won't quit; we decided to add Spiriva daily via handihaler... 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 ~  12/16:  He was [facility] for 9d w/ hypoxemic resp failure, COPD exac, and found to have pulmHTN w/ 2DEcho showing PAsys=51;  Treated w/ O2, Pred taper, antibiotics, NEBS, Advair/Spiriva, etc;  He says he has quit smoking... ~  [facility] 10/2015> CXR- COPD, bullous dis, incr markings at bases, NAD; Cultures of blood/ sput/ urine were all neg; Serologies for strep & legionella were neg; Viral panel was neg; He was treated w/ Oxygen, IV Solumedrol, Rocephin/ Zithromax, NEBS, Lovenox, flutter, etc; He was disch on Home O2- 2L/min activ & 1L/min rest, Pred taper, NEBS, Levaquin, Advair250/ Spiriva => improved. 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   HE HAS ESTABLISHED PRIMARY CARE w/ [name], [place] w/ [name] et al >>   HYPERTENSION (ICD-401.9) - controlled on METOPROLOL '25mg'$ Tid,  NORVASC '10mg'$ /d, LISINOPRIL '40mg'$ /d...   ~  11/11:  BP=122/70, not checking BP's at home... denies HA, fatigue, visual changes, CP, palipit, dizziness, syncope,  dyspnea, edema, etc. ~  5/12:  BP= 120/72, pt very pleased w/ his wt loss & improved exercise ability. ~  6/13:  BP= 122/64 & feeling well w/o CP, palpit, SOB, edema, etc... ~  12/13:  BP=122/82 & he denies CP, palpit, SOB, edema... ~  7/14: on Metop25-3/d, Amlod10, Lisin40; BP=130/78 & he denies CP, palpit, SOB, edema ~  1/15: BP remains stable on Metop25-2AM & 1PM, Amlod5, Lisin40; BP= 120/72 and he denies CP, palpit, ch in SOB, edema. ~  2016> His meds have been adjusted- now on Rx w/ Amlod10, Lisin10; BP= 130/72 7 he denies CP, palpit, edema; chr SOB/DOE due to his COPD.  CORONARY ARTERY DISEASE (ICD-414.00) - ASA '81mg'$ /d & off Plavix as of 2/13 officially as he wasn't taking it regularly anyway. ~  Hx prev IWMI and PTCA/ stent in RCA by DrBrodie 12/95 & 5/96...  ~  Myoview 4/08 abnormal w/ inferior ischemia and subseq cath revealing restenoses in RCA- s/p repeat PTCA/ stents...  ~  f/u Myoview 9/08 w/ prev infer infarct, no ischemia, EF=56%... ~  9/11:  pre op cardiac eval DrBrodie> OK to hold ASA/ Plavix for bladder surg. 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 ~  2/13:  He had f/u [name] w/ Myoview showing a fixed defect in infer wall, no ischemia, EF=58%; pt wasn't taking Plavix, therefore stopped in favor of ASA'81mg'$ /d... ~  3/14:  He had f/u [name]> CAD, IWMI 1995, mult PCIs, last Myoview 2013 was low risk; too sedentary, no changes made; 19 y/o brother died recently w/ AAA rupture... ~  EKG 3/14 showed NSR, rate65, WNL, NAD... ~  He maintains f/u w/ [facility], [name]- known CAD, Hx inferior MI 1995, s/p mult PCIs- most recent 2009; finally qit smoking 12/16 after [facility] for AECOPD w/ hypoxemia; on ASA, statin, Amlod & Lisin.  HYPERCHOLESTEROLEMIA (Mixed Hyperlipidemia) - prev on Simva80 but DrBrodie decr him to SIMVASTATIN '20mg'$ /d + Fish Oil & Red Wine qd... ~  [place] 9/08 on Simva40 showed TChol 151, TG 106,  HDL 23, LDL 107 ~  FLP 8/09 on Simva80 showed TChol 142, TG 135, HDL 28, LDL 87 ~  FLP 6/10 on Simva80 showed TChol 151, TG 117, HDL 31, LDL 97 ~  FLP in hosp 10/10 showed TChol 128, TG 110, HDL 26, LDL 80 ~  FLP 5/11 on Simva80 showed TChol 144, TG 215, HDL 26, LDL 89 ~  9/11:  DrBrodie decr him to Simva20... we discussed the need for better diet & wt reduction. ~  FLP 5/12 on Simva20 showed TChol 162, TG 159, HDL 32, LDL 98 ~  FLP 6/13 on Simva20 showed TChol 130, TG 102, HDL 33, LDL 77... rec to incr exercise program & get wt down! ~  FLP 7/14 on Simva20 showed TChol 158, TG 145, HDL 33, LDL 96... He has gained way too much wt 7 must get on diet! ~  Followed by PCP- [name]...  DIABETES MELLITUS, BORDERLINE (ICD-790.29) - on diet alone... ~  labs 9/08 showed BS=100, HgA1c=6.1 ~  labs 8/09 showed BS= 107, A1c= 6.2 ~  labs 6/10 showed BS= 108, A1c= 6.2 ~  lab 5/11 (wt=244#) showed BS= 121. A1c= 6.7.[name]. he was told "get wt down, or start meds". ~  Labs 5/12 showed BS= 109, A1c= 6.1 ~  Labs 6/13 showed BS= 114, & he needs to lose the weight... ~  Labs 7/14 on diet alone showed BS= 120, A1c= 6.7 ~  Followed by PCP- [name]...  OBESITY (ICD-278.00) - weight was down as low as 202# in 2/09, and steadily incr to 244# by 5/11... ~  weight 5/11 = 244#.[name].  we reviewed diet + exercise necessary to lose weight... ~  weight 11/11 = 217# (after dx & rx for bladder cancer) ~  Weight 5/12 = 214# but says it's 192# at home... ~  Weight 6/13 = 211# but knows it's better since his pant/ belt is down to 36" (under his roll). ~  Weight 7/14 = 230# but he has quit smoking he says, now must lose the weight... ~  Weight 1/15 = 229# ~  Weight 1/17 = 216# ~  Weight 3/17 = 231# and we reviewed diet, exercise, wt reducing strategies...  GASTRITIS (ICD-535.50) - had EGD 10/05 showing gastritis... on RANITADINE '300mg'$ /d  due to his Plavix Rx, off prev Omep.  COLONIC POLYPS (ICD-211.3) - last colonoscopy by  DrStark 3/04 showed 7m polyp = hyperplastic w/ f/u planned 166yr. 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 ~  4/12:  Pt reported f/u cysto was neg, doing well... ~  10/12:  Note from [name] reviewed> cysto was neg, no recurrent tumors... ~  4/13:  Note from [name] reviewed> neg f/u cysto w/o recurrent TCCa... He wants to continue Q6m26mostoscopies... ~  1/14:  He had f/u w/ [name]> Hx TCCa bladder, s/p TURBT 9/11, given BCG 11/11-12/11 and f/u Cysto 1/12 was neg; repeat cysto 1/14 was neg as well...  DEGENERATIVE JOINT DISEASE (ICD-715.90) - He has c/o right shoulder pain, hip pain, knee pain, and LBP> Rx ADVIL w/ some benefit... offered Ortho referral & he will decide.  ANXIETY (ICD-300.00) - on ALPRAZOLAM 0.'5mg'$  Prn...   Past Surgical History:  Procedure Laterality Date  . cataract surgery  septemeber 2013  . cataract surgery/sub posterior vitrectomy in left eye  1996   Dr. RanZadie [name] PTCA  [phone] Outpatient Encounter Prescriptions as of 01/27/2017  Medication Sig  . albuterol (PROVENTIL HFA;VENTOLIN HFA) 108 (90 BASE) MCG/ACT inhaler Inhale 2 puffs into the lungs 4 (four) times daily as needed for wheezing (for use when not at home / unable to use nebulizer).  . ALPRAZolam (XANAX) 0.5 MG tablet Take 0.5 mg by mouth at bedtime.   . aMarland Kitchenpirin 81 MG tablet Take 81 mg by mouth at bedtime.   . budesonide (PULMICORT) 0.25 MG/2ML nebulizer solution Take 2 mLs (0.25 mg total) by nebulization 2 (two) times daily.  . Flaxseed, Linseed, (FLAXSEED OIL) 1000 MG CAPS Take 1 capsule by mouth daily.    . furosemide (LASIX) 40 MG tablet Take 40 mg by mouth 2 (two) times daily.  . iMarland Kitchenuprofen (ADVIL,MOTRIN) 600 MG tablet Take 600 mg by  mouth every 6 (six) hours as needed for moderate pain.  . iMarland Kitchenratropium-albuterol (DUONEB) 0.5-2.5 (3) MG/3ML SOLN Take 3 mLs by nebulization 4 (four) times daily.  . irbesartan (AVAPRO) 75 MG tablet Take 1 tablet (75 mg total) by mouth daily.  . isosorbide mononitrate (IMDUR) 30 MG 24 hr tablet Take 1 tablet (30 mg total) by mouth daily.  . Multiple Vitamin (MULTIVITAMIN) capsule Take 1 capsule by mouth daily.    . nitroGLYCERIN (NITROSTAT) 0.4 MG SL tablet Place 1 tablet (0.4 mg total) under the tongue every 5 (five) minutes as needed for chest pain.  . Omega-3 Fatty Acids (FISH OIL) 1200 MG CAPS Take 1 capsule by mouth daily.    . potassium chloride SA (K-DUR,KLOR-CON) 20 MEQ tablet Take 1 tablet (20 mEq total) by mouth daily.  . predniSONE (DELTASONE) 20 MG tablet Take 2 tablets a day for 4 days, then 1 tablet a day for 4 days, then half a tablet for 4 days, then stop  . simvastatin (ZOCOR) 20 MG tablet Take 1 tablet (20 mg total) by mouth daily at 6 PM.  . sodium chloride (OCEAN) 0.65 % SOLN nasal spray Place 1 spray into both nostrils as needed for congestion.  . aMarland Kitchenbuterol (PROVENTIL) (2.5 MG/3ML) 0.083% nebulizer solution Take 3 mLs (2.5 mg total) by  nebulization every 2 (two) hours as needed for wheezing or shortness of breath. (Patient not taking: Reported on 01/27/2017)    Allergies  Allergen Reactions  . Amlodipine Swelling    Current Medications, Allergies, Past Medical History, Past Surgical History, Family History, and Social History were reviewed in [employer] record.    Review of Systems        See HPI - all other systems neg except as noted... The patient complains of some dyspnea on exertion.  The patient denies anorexia, fever, weight loss, weight gain, vision loss, decreased hearing, hoarseness, chest pain, syncope, peripheral edema, prolonged cough, headaches, hemoptysis, abdominal pain, melena, hematochezia, severe indigestion/heartburn, hematuria,  incontinence, muscle weakness, suspicious skin lesions, transient blindness, difficulty walking, depression, unusual weight change, abnormal bleeding, enlarged lymph nodes, and angioedema.     Objective:   Physical Exam    WD, Obese, 71 y/o WM in NAD... Vital Signs:  Reviewed... GENERAL:  Alert & oriented; pleasant & cooperative... HEENT:  [place]/AT, EOM-wnl, PERRLA, EACs-clear, TMs-wnl, NOSE- sl congestion, THROAT-clear & wnl. NECK:  Supple w/ fairROM; no JVD; normal carotid impulses w/o bruits; no thyromegaly or nodules palpated; no lymphadenopathy. CHEST:  decr BS at bases bilat w/ few scat rhonchi, no wheezing/ rales/ or signs of consolidation... HEART:  Regular Rhythm; without murmurs/ rubs/ or gallops detected... ABDOMEN:  Obese, soft, & nontender; sm knot palpated in mid abd above umbilicus; normal bowel sounds; no organomegaly detected. EXT: without deformities, mild arthritic changes; no varicose veins/ +venous insuffic/ no edema. NEURO:  CN's intact; no focal neuro deficits... DERM:  No lesions noted; no rash etc...  RADIOLOGY DATA:  Reviewed in the EPIC EMR & discussed w/ the patient...  LABORATORY DATA:  Reviewed in the EPIC EMR & discussed w/ the patient...   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  11/21/15>  referred for PulmRehab, contact [place] re- POC, he still needs Full PFTs & CT Chest... We plan ROV in 6 wks 02/02/16> We discussed continuing the NEBS Tid followed by Advair250Bid & Spiriva daily; add Mucinex '1200mg'$ Bid & we will Rx w/ Levaquin500/d x7d for his bronchitic infection (see AVS);  He still  needs a CT Chest but wants to wait for now;  Needs to get into the [facility] program ASAP, and he wants diff DME supplier- we will inquire, plan ROV 60mo6/21/17>   The AOsgoodwere too $$ so we switched to NEBS w/ Duoneb Qid & Budes Bid; added ProairHFA prn; he desperately needs to lose wt in conjunction w/ his PulmRehab etc... 08/05/16>   He presents w/ a COPD exac-- Rx w/ Levaquin, Pred20-4d taper + NEBS w/ duoneb Qid & budes Bid... 11/24/16>   EJaquelyn Bitterhas severe COPD/emphysema w/ pulmHTN & CorPulmonale; he refuses to fill inhalers due to costs & he is therefore on O2- 24/7, BiPAP nightly, NEBS w/ Duoneb Qid & Budes Bid, Mucinex 1200Bid, & asked to get into a regular exercise program;  He will continue regular f/u w/ [facility]- [name]; must work on weight reduction... 01/27/17>   EBlesshas severe end-stage COPD-  mixed chr bronchitis & emphysema w/ chr hypoxemia & hypercarbic resp failure, w/ Hx pulmHTN & right heart failure; s/p Hosp & now improved and approaching his baseline on max meds=> O2 at 2-4L/min 24/7 (he has [employer] that supplies 2L/min continuous or up to 6L/min pulse dose flow), nocturnal BiPAP, NEBs w/ Duoneb Qid & Budes 0.25Bid, Mucinex '1200mg'$ Bid, weaning oral Prednisone from his recent hosp, finished oral Levaquin; on ASA81, Lasix40Bid, Avapro75, Imdur30, K20, Zocor20, and off prev Metoprolol... We discussed continuing the Pred taper til gone, continue other meds the same for now & ROV recheck 41mo.. Note: he has been followed for Cards by [name] (HBP, CAD w/ IWMI 1995 & mult PCIs, HL) and last seen in 201`5 but he saw SRichardson Doppx2 in 5-04/2016...   Primary Care followed by [name]>> HBP>   CAD>            Followed by [name] & DWilburt FinlayLIPIDS>   DM>   GI> Hx Gastritis, Polyps>   GU>   Followed by [name]   Patient's Medications  New Prescriptions   GUAIFENESIN '1200mg'$  Tabs    1 tab by mouth twice daily w/ fluids   LEVOFLOXACIN (LEVAQUIN) 500 MG TABLET    1 tablet  by mouth once daily until gone (for infection)  Previous Medications   ALBUTEROL (PROVENTIL HFA;VENTOLIN HFA) 108 (90 BASE) MCG/ACT INHALER    Inhale 2 puffs into the lungs 4 (four) times daily as needed for wheezing (for use when not at home / unable to use nebulizer).   ALBUTEROL (PROVENTIL) (2.5 MG/3ML) 0.083% NEBULIZER SOLUTION    Take 3 mLs (2.5 mg total) by nebulization every 2 (two) hours as needed for wheezing or shortness of breath.   ALPRAZOLAM (XANAX) 0.5 MG TABLET    Take 0.5 mg by mouth at bedtime.    ASPIRIN 81 MG TABLET    Take 81 mg by mouth at bedtime.    FLAXSEED, LINSEED, (FLAXSEED OIL) 1000 MG CAPS    Take 1 capsule by mouth daily.     FUROSEMIDE (LASIX) 40 MG TABLET    Take 40 mg by mouth 2 (two) times daily.   IBUPROFEN (ADVIL,MOTRIN) 600 MG TABLET    Take 600 mg by mouth every 6 (six) hours as needed for moderate pain.   IRBESARTAN (AVAPRO) 75 MG TABLET    Take 1 tablet (75 mg total) by mouth daily.   ISOSORBIDE MONONITRATE (IMDUR) 30 MG 24 HR TABLET    Take 1 tablet (30 mg total) by mouth daily.   MULTIPLE VITAMIN (MULTIVITAMIN) CAPSULE    Take 1 capsule by mouth daily.     NITROGLYCERIN (NITROSTAT) 0.4 MG SL TABLET    Place 1 tablet (0.4 mg total) under the tongue every 5 (five) minutes as needed for chest pain.   OMEGA-3 FATTY ACIDS (FISH OIL) 1200 MG CAPS    Take 1 capsule by mouth daily.     POTASSIUM CHLORIDE SA (K-DUR,KLOR-CON) 20 MEQ TABLET    Take 1 tablet (20 mEq total) by mouth daily.   PREDNISONE (DELTASONE) 20 MG TABLET    Take 2 tablets a day for 4 days, then 1 tablet a day for 4 days, then half a tablet for 4 days, then stop   SIMVASTATIN (ZOCOR) 20 MG TABLET    Take 1 tablet (20 mg total) by mouth daily at 6 PM.   SODIUM CHLORIDE (OCEAN) 0.65 % SOLN NASAL SPRAY    Place 1 spray into both nostrils as needed for  congestion.  Modified Medications   Modified Medication Previous Medication   BUDESONIDE (PULMICORT) 0.25 MG/2ML NEBULIZER SOLUTION budesonide  (PULMICORT) 0.25 MG/2ML nebulizer solution      Take 2 mLs (0.25 mg total) by nebulization 2 (two) times daily.    Take 2 mLs (0.25 mg total) by nebulization 2 (two) times daily.   IPRATROPIUM-ALBUTEROL (DUONEB) 0.5-2.5 (3) MG/3ML SOLN ipratropium-albuterol (DUONEB) 0.5-2.5 (3) MG/3ML SOLN      Take 3 mLs by nebulization 4 (four) times daily.    Take 3 mLs by nebulization 4 (four) times daily.  Discontinued Medications   No medications on file  NOTE>> Advair & Spiriva discontinued due to cost & replaced by DUONEB-Qid & PULMICORT 0,'25mg'$ Bid via NEBULIZER.Marland KitchenMarland Kitchen

## 2017-01-31 ENCOUNTER — Telehealth: Payer: Self-pay | Admitting: Pulmonary Disease

## 2017-01-31 NOTE — Telephone Encounter (Signed)
No number was provided to call this caller back. The number provided is a fax number. I have no way of contacting this person back to find out what prescriptions are needed. Per triage protocol, message will be closed.

## 2017-02-01 DIAGNOSIS — I110 Hypertensive heart disease with heart failure: Secondary | ICD-10-CM | POA: Diagnosis not present

## 2017-02-01 DIAGNOSIS — J441 Chronic obstructive pulmonary disease with (acute) exacerbation: Secondary | ICD-10-CM | POA: Diagnosis not present

## 2017-02-08 DIAGNOSIS — J441 Chronic obstructive pulmonary disease with (acute) exacerbation: Secondary | ICD-10-CM | POA: Diagnosis not present

## 2017-02-08 DIAGNOSIS — I110 Hypertensive heart disease with heart failure: Secondary | ICD-10-CM | POA: Diagnosis not present

## 2017-02-15 DIAGNOSIS — I110 Hypertensive heart disease with heart failure: Secondary | ICD-10-CM | POA: Diagnosis not present

## 2017-02-15 DIAGNOSIS — J441 Chronic obstructive pulmonary disease with (acute) exacerbation: Secondary | ICD-10-CM | POA: Diagnosis not present

## 2017-02-16 ENCOUNTER — Other Ambulatory Visit: Payer: Self-pay | Admitting: Internal Medicine

## 2017-02-16 DIAGNOSIS — R911 Solitary pulmonary nodule: Secondary | ICD-10-CM

## 2017-02-17 ENCOUNTER — Ambulatory Visit
Admission: RE | Admit: 2017-02-17 | Discharge: 2017-02-17 | Disposition: A | Discharge status: Home / Self Care | Payer: Medicare Other | Source: Ambulatory Visit | Attending: Internal Medicine | Admitting: Internal Medicine

## 2017-02-17 ENCOUNTER — Other Ambulatory Visit: Payer: Self-pay | Admitting: Internal Medicine

## 2017-02-17 DIAGNOSIS — R911 Solitary pulmonary nodule: Secondary | ICD-10-CM

## 2017-02-17 DIAGNOSIS — Z8551 Personal history of malignant neoplasm of bladder: Secondary | ICD-10-CM

## 2017-02-17 DIAGNOSIS — R1900 Intra-abdominal and pelvic swelling, mass and lump, unspecified site: Secondary | ICD-10-CM

## 2017-02-17 DIAGNOSIS — R918 Other nonspecific abnormal finding of lung field: Secondary | ICD-10-CM | POA: Diagnosis not present

## 2017-02-18 ENCOUNTER — Ambulatory Visit
Admission: RE | Admit: 2017-02-18 | Discharge: 2017-02-18 | Disposition: A | Discharge status: Home / Self Care | Payer: Medicare Other | Source: Ambulatory Visit | Attending: Internal Medicine | Admitting: Internal Medicine

## 2017-02-18 DIAGNOSIS — K8020 Calculus of gallbladder without cholecystitis without obstruction: Secondary | ICD-10-CM | POA: Diagnosis not present

## 2017-02-18 DIAGNOSIS — K5730 Diverticulosis of large intestine without perforation or abscess without bleeding: Secondary | ICD-10-CM | POA: Diagnosis not present

## 2017-02-18 DIAGNOSIS — Z8551 Personal history of malignant neoplasm of bladder: Secondary | ICD-10-CM

## 2017-02-18 DIAGNOSIS — R1900 Intra-abdominal and pelvic swelling, mass and lump, unspecified site: Secondary | ICD-10-CM

## 2017-02-18 MED ORDER — IOPAMIDOL (ISOVUE-300) INJECTION 61%
100.0000 mL | Freq: Once | INTRAVENOUS | Status: AC | PRN
  Administered 2017-02-18: 100 mL via INTRAVENOUS

## 2017-02-22 ENCOUNTER — Other Ambulatory Visit (HOSPITAL_COMMUNITY): Payer: Self-pay | Admitting: Internal Medicine

## 2017-02-22 DIAGNOSIS — J441 Chronic obstructive pulmonary disease with (acute) exacerbation: Secondary | ICD-10-CM | POA: Diagnosis not present

## 2017-02-22 DIAGNOSIS — I110 Hypertensive heart disease with heart failure: Secondary | ICD-10-CM | POA: Diagnosis not present

## 2017-02-22 DIAGNOSIS — R1900 Intra-abdominal and pelvic swelling, mass and lump, unspecified site: Secondary | ICD-10-CM

## 2017-02-24 DIAGNOSIS — J9601 Acute respiratory failure with hypoxia: Secondary | ICD-10-CM | POA: Diagnosis not present

## 2017-02-24 DIAGNOSIS — Z6837 Body mass index (BMI) 37.0-37.9, adult: Secondary | ICD-10-CM | POA: Diagnosis not present

## 2017-02-24 DIAGNOSIS — I5031 Acute diastolic (congestive) heart failure: Secondary | ICD-10-CM | POA: Diagnosis not present

## 2017-02-24 DIAGNOSIS — I2510 Atherosclerotic heart disease of native coronary artery without angina pectoris: Secondary | ICD-10-CM | POA: Diagnosis not present

## 2017-02-24 DIAGNOSIS — J441 Chronic obstructive pulmonary disease with (acute) exacerbation: Secondary | ICD-10-CM | POA: Diagnosis not present

## 2017-03-01 ENCOUNTER — Other Ambulatory Visit: Payer: Self-pay | Admitting: Radiology

## 2017-03-01 ENCOUNTER — Other Ambulatory Visit: Payer: Self-pay | Admitting: Student

## 2017-03-01 DIAGNOSIS — I110 Hypertensive heart disease with heart failure: Secondary | ICD-10-CM | POA: Diagnosis not present

## 2017-03-01 DIAGNOSIS — J441 Chronic obstructive pulmonary disease with (acute) exacerbation: Secondary | ICD-10-CM | POA: Diagnosis not present

## 2017-03-02 ENCOUNTER — Ambulatory Visit (HOSPITAL_COMMUNITY)
Admission: RE | Admit: 2017-03-02 | Discharge: 2017-03-02 | Disposition: A | Discharge status: Home / Self Care | Payer: Medicare Other | Source: Ambulatory Visit | Attending: Internal Medicine | Admitting: Internal Medicine

## 2017-03-02 DIAGNOSIS — Z9981 Dependence on supplemental oxygen: Secondary | ICD-10-CM | POA: Diagnosis not present

## 2017-03-02 DIAGNOSIS — K5730 Diverticulosis of large intestine without perforation or abscess without bleeding: Secondary | ICD-10-CM | POA: Diagnosis not present

## 2017-03-02 DIAGNOSIS — Z87891 Personal history of nicotine dependence: Secondary | ICD-10-CM | POA: Insufficient documentation

## 2017-03-02 DIAGNOSIS — Z6836 Body mass index (BMI) 36.0-36.9, adult: Secondary | ICD-10-CM | POA: Insufficient documentation

## 2017-03-02 DIAGNOSIS — I2510 Atherosclerotic heart disease of native coronary artery without angina pectoris: Secondary | ICD-10-CM | POA: Insufficient documentation

## 2017-03-02 DIAGNOSIS — Z7982 Long term (current) use of aspirin: Secondary | ICD-10-CM | POA: Insufficient documentation

## 2017-03-02 DIAGNOSIS — I700 Atherosclerosis of aorta: Secondary | ICD-10-CM | POA: Insufficient documentation

## 2017-03-02 DIAGNOSIS — K760 Fatty (change of) liver, not elsewhere classified: Secondary | ICD-10-CM | POA: Diagnosis not present

## 2017-03-02 DIAGNOSIS — K8020 Calculus of gallbladder without cholecystitis without obstruction: Secondary | ICD-10-CM | POA: Insufficient documentation

## 2017-03-02 DIAGNOSIS — Z8551 Personal history of malignant neoplasm of bladder: Secondary | ICD-10-CM | POA: Diagnosis not present

## 2017-03-02 DIAGNOSIS — E669 Obesity, unspecified: Secondary | ICD-10-CM | POA: Diagnosis not present

## 2017-03-02 DIAGNOSIS — I10 Essential (primary) hypertension: Secondary | ICD-10-CM | POA: Diagnosis not present

## 2017-03-02 DIAGNOSIS — Z79899 Other long term (current) drug therapy: Secondary | ICD-10-CM | POA: Insufficient documentation

## 2017-03-02 DIAGNOSIS — R1900 Intra-abdominal and pelvic swelling, mass and lump, unspecified site: Secondary | ICD-10-CM | POA: Diagnosis not present

## 2017-03-02 DIAGNOSIS — R1909 Other intra-abdominal and pelvic swelling, mass and lump: Secondary | ICD-10-CM | POA: Diagnosis not present

## 2017-03-02 DIAGNOSIS — J449 Chronic obstructive pulmonary disease, unspecified: Secondary | ICD-10-CM | POA: Diagnosis not present

## 2017-03-02 DIAGNOSIS — D492 Neoplasm of unspecified behavior of bone, soft tissue, and skin: Secondary | ICD-10-CM | POA: Diagnosis not present

## 2017-03-02 DIAGNOSIS — R162 Hepatomegaly with splenomegaly, not elsewhere classified: Secondary | ICD-10-CM | POA: Insufficient documentation

## 2017-03-02 LAB — CBC
HCT: 30.3 % — ABNORMAL LOW (ref 39.0–52.0)
Hemoglobin: 9.5 g/dL — ABNORMAL LOW (ref 13.0–17.0)
MCH: 31 pg (ref 26.0–34.0)
MCHC: 31.4 g/dL (ref 30.0–36.0)
MCV: 99 fL (ref 78.0–100.0)
PLATELETS: 181 10*3/uL (ref 150–400)
RBC: 3.06 MIL/uL — ABNORMAL LOW (ref 4.22–5.81)
RDW: 18.2 % — AB (ref 11.5–15.5)
WBC: 5.4 10*3/uL (ref 4.0–10.5)

## 2017-03-02 LAB — PROTIME-INR
INR: 1.11
PROTHROMBIN TIME: 14.4 s (ref 11.4–15.2)

## 2017-03-02 LAB — APTT: aPTT: 31 seconds (ref 24–36)

## 2017-03-02 MED ORDER — SODIUM CHLORIDE 0.9 % IV SOLN: INTRAVENOUS | Status: DC

## 2017-03-02 MED ORDER — LIDOCAINE HCL 1 % IJ SOLN
INTRAMUSCULAR | Status: AC
  Filled 2017-03-02: qty 20

## 2017-03-02 MED ORDER — MIDAZOLAM HCL 2 MG/2ML IJ SOLN
INTRAMUSCULAR | Status: AC
  Filled 2017-03-02: qty 2

## 2017-03-02 MED ORDER — FENTANYL CITRATE (PF) 100 MCG/2ML IJ SOLN
INTRAMUSCULAR | Status: AC
  Filled 2017-03-02: qty 2

## 2017-03-02 MED ORDER — MIDAZOLAM HCL 2 MG/2ML IJ SOLN
INTRAMUSCULAR | Status: AC | PRN
Start: 2017-03-02 — End: 2017-03-02
  Administered 2017-03-02: 1 mg via INTRAVENOUS

## 2017-03-02 NOTE — Consult Note (Signed)
Chief Complaint: Patient was seen in consultation today for image guided biopsy of abdominal mass  Referring Physician(s): Holwerda,Scott  Supervising Physician: Marybelle Killings  Patient Status: [facility] - Out-pt  History of Present Illness: [name] is a 71 y.o. male with PMH of smoking, COPD- on home O2, bladder cancer, CAD, HTN and abdominal pain. CT A/P done 02/18/17 reveals ; 1. Large heterogeneously enhancing 25.3 x 16.5 x 17.8 cm mass centered in the right abdominal mesentery, abutting several small and large bowel loops. Although differential includes a bulky peritoneal metastasis given the history of bladder cancer, other etiologies including desmoid tumor or sarcoma should be considered given the solitary nature of the mass and apparent growth of this mass over many years since 2011. Tissue sampling and surgical consultation advised. 2. No evidence of bowel obstruction. 3. Trace right lower quadrant ascites. 4. Relatively collapsed and grossly normal bladder. No hydronephrosis. 5. Nonspecific mild hepatosplenomegaly, new since 2011. No abdominopelvic lymphadenopathy. 6. Additional findings include aortic atherosclerosis, coronary atherosclerosis, moderate centrilobular emphysema, diffuse hepatic steatosis, cholelithiasis and moderate left colonic diverticulosis  He presents today for image guided abdominal mass biopsy for further evaluation.   Past Medical History:  Diagnosis Date  . Anxiety   . Bladder cancer ([place]) 2011  . Borderline diabetes mellitus   . CAD (coronary artery disease)   . Cigarette smoker   . COPD (chronic obstructive pulmonary disease) ([place])   . DJD (degenerative joint disease)   . Emphysema of lung ([place])   . Epistaxis   . History of colonic polyps 2004   hyperplastic   . History of sinusitis   . Hypercholesteremia   . Hypertension   . Obesity     Past Surgical History:  Procedure Laterality Date  . cataract surgery  septemeber 2013   . cataract surgery/sub posterior vitrectomy in left eye  1996   Dr. Zadie Rhine  . PTCA  [phone]    Allergies: Amlodipine  Medications: Prior to Admission medications   Medication Sig Start Date End Date Taking? Authorizing Provider  Albuterol Sulfate (PROAIR RESPICLICK) 382 (90 Base) MCG/ACT AEPB Inhale 2 puffs into the lungs every 6 (six) hours as needed (shortness of breath).   Yes Historical Provider, MD  ALPRAZolam Duanne Moron) 0.5 MG tablet Take 0.5 mg by mouth at bedtime.    Yes Historical Provider, MD  Ascorbic Acid (VITAMIN C) 1000 MG tablet Take 1,000 mg by mouth daily.   Yes Historical Provider, MD  aspirin 81 MG tablet Take 81 mg by mouth at bedtime.    Yes Historical Provider, MD  budesonide (PULMICORT) 0.25 MG/2ML nebulizer solution Take 2 mLs (0.25 mg total) by nebulization 2 (two) times daily. 01/28/17  Yes Noralee Space, MD  cholecalciferol (VITAMIN D) 400 units TABS tablet Take 400 Units by mouth at bedtime.   Yes Historical Provider, MD  CINNAMON PO Take 1,000 mg by mouth daily.   Yes Historical Provider, MD  Flaxseed, Linseed, (FLAX SEED OIL PO) Take 1,400 mg by mouth daily.   Yes Historical Provider, MD  furosemide (LASIX) 40 MG tablet Take 40 mg by mouth 2 (two) times daily.   Yes Historical Provider, MD  Garlic 5053 MG CAPS Take 1,000 mg by mouth daily.   Yes Historical Provider, MD  ibuprofen (ADVIL,MOTRIN) 600 MG tablet Take 600 mg by mouth every 6 (six) hours as needed for moderate pain.   Yes Historical Provider, MD  ipratropium-albuterol (DUONEB) 0.5-2.5 (3) MG/3ML SOLN Take 3 mLs by nebulization  4 (four) times daily. 01/28/17  Yes Noralee Space, MD  isosorbide mononitrate (IMDUR) 30 MG 24 hr tablet Take 1 tablet (30 mg total) by mouth daily. 03/16/16  Yes Scott Joylene Draft, PA-C  lisinopril (PRINIVIL,ZESTRIL) 10 MG tablet Take 10 mg by mouth daily. 12/05/16  Yes Historical Provider, MD  Multiple Vitamin (MULTIVITAMIN WITH MINERALS) TABS tablet Take 1 tablet by mouth  daily.   Yes Historical Provider, MD  multivitamin-lutein (OCUVITE-LUTEIN) CAPS capsule Take 1 capsule by mouth 2 (two) times daily.   Yes Historical Provider, MD  nitroGLYCERIN (NITROSTAT) 0.4 MG SL tablet Place 1 tablet (0.4 mg total) under the tongue every 5 (five) minutes as needed for chest pain. 11/19/15  Yes Liliane Shi, PA-C  Omega-3 Fatty Acids (FISH OIL) 1000 MG CPDR Take 1,000 mg by mouth daily.   Yes Historical Provider, MD  Polyethyl Glycol-Propyl Glycol (SYSTANE OP) Apply 1 drop to eye daily as needed (dry eyes).   Yes Historical Provider, MD  potassium chloride SA (K-DUR,KLOR-CON) 20 MEQ tablet Take 1 tablet (20 mEq total) by mouth daily. Patient taking differently: Take 20 mEq by mouth every evening.  01/27/17  Yes Cherene Altes, MD  simvastatin (ZOCOR) 20 MG tablet Take 1 tablet (20 mg total) by mouth daily at 6 PM. 11/19/15  Yes Liliane Shi, PA-C  sodium chloride (OCEAN) 0.65 % SOLN nasal spray Place 1 spray into both nostrils as needed for congestion. 10/29/15  Yes Cherene Altes, MD  vitamin B-12 (CYANOCOBALAMIN) 1000 MCG tablet Take 1,000 mcg by mouth daily.   Yes Historical Provider, MD  irbesartan (AVAPRO) 75 MG tablet Take 1 tablet (75 mg total) by mouth daily. Patient not taking: Reported on 02/24/2017 01/27/17   Cherene Altes, MD  levofloxacin (LEVAQUIN) 500 MG tablet 1 tablet by mouth once daily until gone (for infection) Patient not taking: Reported on 02/24/2017 01/27/17   Noralee Space, MD  predniSONE (DELTASONE) 20 MG tablet Take 2 tablets a day for 4 days, then 1 tablet a day for 4 days, then half a tablet for 4 days, then stop Patient not taking: Reported on 02/24/2017 01/26/17   Cherene Altes, MD     Family History  Problem Relation Age of Onset  . Heart disease Mother   . Heart disease Brother   . Cancer Brother   . Aneurysm Brother     Social History   Social History  . Marital status: Married    Spouse name: [name]  . Number of children: 2    . Years of education: N/A   Occupational History  . Retired    Social History Main Topics  . Smoking status: Former Smoker    Packs/day: 2.00    Years: 45.00    Types: Cigarettes    Quit date: 10/17/2015  . Smokeless tobacco: Never Used  . Alcohol use 0.0 oz/week     Comment: glass of wine at night  . Drug use: No  . Sexual activity: Not on file   Other Topics Concern  . Not on file   Social History Narrative  . No narrative on file      Review of Systems currently denies fever, CP, N/V or bleeding. He has had HA, cough, dyspnea, abd/back pain.  Vital Signs: BP 135/65   Pulse 92   Temp 98.5 F (36.9 C)   Resp 18   Ht '5\' 6"'$  (1.676 m)   Wt 225 lb (102.1 kg)   SpO2  93%   BMI 36.32 kg/m   Physical Exam awake/alert; chest- distant BS bilat; heart- RRR; abd- obese, soft,+BS, large palpable rt lat abd mass, sl tender ; bilat LE edema  Mallampati Score:     Imaging: Ct Chest Wo Contrast  Result Date: 02/17/2017 CLINICAL DATA:  Pulmonary nodules.  History of bladder carcinoma EXAM: CT CHEST WITHOUT CONTRAST TECHNIQUE: Multidetector CT imaging of the chest was performed following the standard protocol without IV contrast. COMPARISON:  August 19, 2016 FINDINGS: Cardiovascular: There is no demonstrable thoracic aortic aneurysm. There is atherosclerotic calcification in the aorta. There are multiple foci of coronary artery calcification. There is moderate atherosclerotic calcification at the origins of the visualized great vessels. Pericardium is not appreciably thickened. Main pulmonary outflow tract measures 3.3 cm, essentially stable from prior study. Mediastinum/Nodes: There are scattered subcentimeter mediastinal lymph nodes. No adenopathy is evident. Thyroid appears unremarkable. Lungs/Pleura: Extensive bullous emphysematous change with areas of cicatrization remain stable. Bullous disease is most pronounced in the upper lobe regions. Areas of mild interstitial prominence  are largely felt to be due to redistribution of blood flow to viable lung segments. In comparison with the prior study, there is a stable nodular opacity in the anterior segment of the right upper lobe measuring 8 x 5 mm. On axial slice 71 series 6, there is a nodular opacity in the lateral segment of the left lower lobe measuring 8 x 6 mm with an adjacent nodular opacity measuring 7 x 6 mm, stable. There is a focal opacity in the inferior aspect of the posterior segment of the left lower lobe which is stable, measuring 1.3 x 0.9 cm, seen on axial slice 034 series 6. There is no new parenchymal lung opacity. No pleural effusion or pleural thickening evident. No edema or consolidation. Upper Abdomen: There is atherosclerotic calcification in the aorta. There is incomplete visualization of a cyst arising from the anterior right kidney measuring 4.9 x 4.7 cm. There is incomplete visualization of a mass in the mid abdomen at the level of the kidneys anteriorly measuring 10.5 x 10.5 cm. This mass has attenuation values slightly higher than is expected with a simple cyst. Its etiology is uncertain. Musculoskeletal: There is degenerative change in the lower thoracic spine. There are no blastic or lytic bone lesions. IMPRESSION: Nodular opacities in the lungs remain stable. The largest opacity is abutting the pleura in the inferior most aspect of the left lower lobe measuring 1.3 x 0.9 cm. It is possible that this area represents localized rounded atelectasis as opposed to a mass. Given a lesion of this size, it may be prudent to consider additional noncontrast enhanced chest CT examination in 3-6 months to assess for stability. Extensive underlying bullous emphysematous change. No evident adenopathy. Areas of atherosclerotic calcification including multiple foci of coronary artery calcification. Incomplete visualization of a mass in the anterior mid abdomen measuring 10.5 x 10.5 cm. Its etiology is uncertain. This finding  may well warrant CT of the abdomen and pelvis, ideally with oral and intravenous contrast, to further evaluate and localize. These results will be called to the ordering clinician or representative by the Radiologist Assistant, and communication documented in the PACS or zVision Dashboard. Electronically Signed   By: Lowella Grip III M.D.   On: 02/17/2017 11:29   Ct Abdomen Pelvis W Contrast  Result Date: 02/18/2017 CLINICAL DATA:  History of bladder cancer. Partially visualized right abdominal mass on recent chest CT. EXAM: CT ABDOMEN AND PELVIS WITH CONTRAST TECHNIQUE: Multidetector  CT imaging of the abdomen and pelvis was performed using the standard protocol following bolus administration of intravenous contrast. CONTRAST:  174m ISOVUE-300 IOPAMIDOL (ISOVUE-300) INJECTION 61% COMPARISON:  02/17/2017 chest CT.  07/02/2010 CT abdomen/pelvis. FINDINGS: Lower chest: Moderate centrilobular emphysema at the lung bases. Coronary atherosclerosis. Hepatobiliary: Diffuse hepatic steatosis. Mild hepatomegaly. No liver mass. No definite liver surface irregularity. Cholelithiasis, with no gallbladder distention, gallbladder wall thickening or pericholecystic fluid. No biliary ductal dilatation. Pancreas: Diffuse fatty infiltration of the otherwise normal pancreas, with no pancreatic mass or duct dilation . Spleen: New mild splenomegaly (craniocaudal splenic length 13.7 cm). No splenic mass. Adrenals/Urinary Tract: No discrete adrenal nodule. No hydronephrosis . Simple 6.7 cm renal cyst in the anterior upper right kidney. Additional subcentimeter hypodense renal cortical lesions in both kidneys are too small to characterize and require no further follow-up. Relatively collapsed and grossly normal bladder. Stomach/Bowel: Grossly normal stomach. There is a large heterogeneously enhancing 25.3 x 16.5 x 17.8 cm mass centered in the right abdominal mesentery (series 2/ image 49), which probably represents interval growth  of a 5.2 x 4.2 x 4.1 cm mass seen in this location on the 07/02/2010 CT abdomen/ pelvis study. This large right mesenteric mass abuts several small bowel loops. No small bowel dilatation or wall thickening. Normal appendix. The right mesenteric mass abuts the cecum, ascending colon and hepatic flexure of the colon. Moderate diverticulosis of the descending and sigmoid colon, with no large bowel wall thickening or pericolonic fat stranding. Vascular/Lymphatic: Atherosclerotic nonaneurysmal abdominal aorta. Patent portal, splenic, hepatic and renal veins. No pathologically enlarged lymph nodes in the abdomen or pelvis. Reproductive: Top-normal size prostate. Other: No pneumoperitoneum.  Trace right lower quadrant ascites. Musculoskeletal: No aggressive appearing focal osseous lesions. Marked thoracolumbar spondylosis. IMPRESSION: 1. Large heterogeneously enhancing 25.3 x 16.5 x 17.8 cm mass centered in the right abdominal mesentery, abutting several small and large bowel loops. Although differential includes a bulky peritoneal metastasis given the history of bladder cancer, other etiologies including desmoid tumor or sarcoma should be considered given the solitary nature of the mass and apparent growth of this mass over many years since 2011. Tissue sampling and surgical consultation advised. 2. No evidence of bowel obstruction. 3. Trace right lower quadrant ascites. 4. Relatively collapsed and grossly normal bladder. No hydronephrosis. 5. Nonspecific mild hepatosplenomegaly, new since 2011. No abdominopelvic lymphadenopathy. 6. Additional findings include aortic atherosclerosis, coronary atherosclerosis, moderate centrilobular emphysema, diffuse hepatic steatosis, cholelithiasis and moderate left colonic diverticulosis. Electronically Signed   By: JIlona SorrelM.D.   On: 02/18/2017 10:40    Labs:  CBC:  Recent Labs  09/28/16 0221 11/24/16 1110 01/19/17 1603 01/23/17 0312  WBC 12.8* 6.2 5.8 10.3  HGB  11.8* 10.7* 10.1* 10.5*  HCT 37.3* 31.5* 32.8* 33.9*  PLT 184 186.0 158 190    COAGS: No results for input(s): INR, APTT in the last 8760 hours.  BMP:  Recent Labs  01/21/17 0237 01/22/17 0327 01/23/17 0312 01/24/17 0247 01/26/17 0942  NA 138 134* 133* 131*  --   K 4.0 4.2 4.0 4.4  --   CL 100* 94* 94* 93*  --   CO2 '30 29 31 28  '$ --   GLUCOSE 130* 149* 149* 151*  --   BUN 21* 23* 29* 29*  --   CALCIUM 8.8* 9.2 9.1 9.0  --   CREATININE 0.90 0.88 1.10 0.98 1.00  GFRNONAA >60 >60 >60 >60 >60  GFRAA >60 >60 >60 >60 >60    LIVER  FUNCTION TESTS:  Recent Labs  11/24/16 1110 01/19/17 1603  BILITOT 2.0* 1.8*  AST 24 29  ALT 23 26  ALKPHOS 42 39  PROT 6.9 6.5  ALBUMIN 4.2 3.8    TUMOR MARKERS: No results for input(s): AFPTM, CEA, CA199, CHROMGRNA in the last 8760 hours.  Assessment and Plan:  71 y.o. male with PMH of smoking, COPD- on home O2, bladder cancer, CAD, HTN and abdominal pain. CT A/P done 02/18/17 reveals ; 1. Large heterogeneously enhancing 25.3 x 16.5 x 17.8 cm mass centered in the right abdominal mesentery, abutting several small and large bowel loops. Although differential includes a bulky peritoneal metastasis given the history of bladder cancer, other etiologies including desmoid tumor or sarcoma should be considered given the solitary nature of the mass and apparent growth of this mass over many years since 2011. Tissue sampling and surgical consultation advised. 2. No evidence of bowel obstruction. 3. Trace right lower quadrant ascites. 4. Relatively collapsed and grossly normal bladder. No hydronephrosis. 5. Nonspecific mild hepatosplenomegaly, new since 2011. No abdominopelvic lymphadenopathy. 6. Additional findings include aortic atherosclerosis, coronary atherosclerosis, moderate centrilobular emphysema, diffuse hepatic steatosis, cholelithiasis and moderate left colonic diverticulosis  He presents today for image guided abdominal mass  biopsy for further evaluation. Risks and benefits discussed with the patient/spouse including, but not limited to bleeding, infection, damage to adjacent structures or low yield requiring additional tests.All of the patient's questions were answered, patient is agreeable to proceed.Consent signed and in chart.     Thank you for this interesting consult.  I greatly enjoyed meeting [name] and look forward to participating in their care.  A copy of this report was sent to the requesting provider on this date.  Electronically Signed: D. Rowe Robert 03/02/2017, 12:32 PM   I spent a total of  25 minutes   in face to face in clinical consultation, greater than 50% of which was counseling/coordinating care for image guided abdominal mass biopsy

## 2017-03-02 NOTE — Sedation Documentation (Signed)
Attempted to call report to ss rn, per sherri nurse is at lunch and will call back

## 2017-03-02 NOTE — Sedation Documentation (Signed)
Patient is resting comfortably. 

## 2017-03-02 NOTE — Procedures (Signed)
Abdominal mass biopsy Core EBL 0 Comp 0

## 2017-03-02 NOTE — Discharge Instructions (Signed)
Needle Biopsy, Care After These instructions give you information about caring for yourself after your procedure. Your doctor may also give you more specific instructions. Call your doctor if you have any problems or questions after your procedure. Follow these instructions at home:  Rest as told by your doctor.  Take medicines only as told by your doctor.  There are many different ways to close and cover the biopsy site, including stitches (sutures), skin glue, and adhesive strips. Follow instructions from your doctor about:  How to take care of your biopsy site.  When and how you should change your bandage (dressing).  When you should remove your dressing.  Removing whatever was used to close your biopsy site.  Check your biopsy site every day for signs of infection. Watch for:  Redness, swelling, or pain.  Fluid, blood, or pus. Contact a doctor if:  You have a fever.  You have redness, swelling, or pain at the biopsy site, and it lasts longer than a few days.  You have fluid, blood, or pus coming from the biopsy site.  You feel sick to your stomach (nauseous).  You throw up (vomit). Get help right away if:  You are short of breath.  You have trouble breathing.  Your chest hurts.  You feel dizzy or you pass out (faint).  You have bleeding that does not stop with pressure or a bandage.  You cough up blood.  Your belly (abdomen) hurts. This information is not intended to replace advice given to you by your health care provider. Make sure you discuss any questions you have with your health care provider. Document Released: 10/14/2008 Document Revised: 04/08/2016 Document Reviewed: 10/28/2014 Elsevier Interactive Patient Education  2017 [employer].

## 2017-03-03 ENCOUNTER — Ambulatory Visit: Payer: Medicare Other | Admitting: Pulmonary Disease

## 2017-03-14 DIAGNOSIS — C494 Malignant neoplasm of connective and soft tissue of abdomen: Secondary | ICD-10-CM | POA: Diagnosis not present

## 2017-03-18 DIAGNOSIS — R1903 Right lower quadrant abdominal swelling, mass and lump: Secondary | ICD-10-CM | POA: Diagnosis not present

## 2017-03-18 DIAGNOSIS — C494 Malignant neoplasm of connective and soft tissue of abdomen: Secondary | ICD-10-CM | POA: Diagnosis not present

## 2017-03-28 DIAGNOSIS — C494 Malignant neoplasm of connective and soft tissue of abdomen: Secondary | ICD-10-CM | POA: Diagnosis not present

## 2017-03-28 DIAGNOSIS — Z510 Encounter for antineoplastic radiation therapy: Secondary | ICD-10-CM | POA: Diagnosis not present

## 2017-03-31 DIAGNOSIS — C494 Malignant neoplasm of connective and soft tissue of abdomen: Secondary | ICD-10-CM | POA: Diagnosis not present

## 2017-03-31 DIAGNOSIS — Z510 Encounter for antineoplastic radiation therapy: Secondary | ICD-10-CM | POA: Diagnosis not present

## 2017-04-01 DIAGNOSIS — C494 Malignant neoplasm of connective and soft tissue of abdomen: Secondary | ICD-10-CM | POA: Diagnosis not present

## 2017-04-01 DIAGNOSIS — Z510 Encounter for antineoplastic radiation therapy: Secondary | ICD-10-CM | POA: Diagnosis not present

## 2017-04-04 DIAGNOSIS — C494 Malignant neoplasm of connective and soft tissue of abdomen: Secondary | ICD-10-CM | POA: Diagnosis not present

## 2017-04-04 DIAGNOSIS — Z510 Encounter for antineoplastic radiation therapy: Secondary | ICD-10-CM | POA: Diagnosis not present

## 2017-04-05 DIAGNOSIS — I252 Old myocardial infarction: Secondary | ICD-10-CM | POA: Diagnosis not present

## 2017-04-05 DIAGNOSIS — Z888 Allergy status to other drugs, medicaments and biological substances status: Secondary | ICD-10-CM | POA: Diagnosis not present

## 2017-04-05 DIAGNOSIS — E782 Mixed hyperlipidemia: Secondary | ICD-10-CM | POA: Diagnosis not present

## 2017-04-05 DIAGNOSIS — Z79899 Other long term (current) drug therapy: Secondary | ICD-10-CM | POA: Diagnosis not present

## 2017-04-05 DIAGNOSIS — J449 Chronic obstructive pulmonary disease, unspecified: Secondary | ICD-10-CM | POA: Diagnosis not present

## 2017-04-05 DIAGNOSIS — I2510 Atherosclerotic heart disease of native coronary artery without angina pectoris: Secondary | ICD-10-CM | POA: Diagnosis not present

## 2017-04-05 DIAGNOSIS — R1903 Right lower quadrant abdominal swelling, mass and lump: Secondary | ICD-10-CM | POA: Diagnosis not present

## 2017-04-05 DIAGNOSIS — I10 Essential (primary) hypertension: Secondary | ICD-10-CM | POA: Diagnosis not present

## 2017-04-05 DIAGNOSIS — E7800 Pure hypercholesterolemia, unspecified: Secondary | ICD-10-CM | POA: Diagnosis not present

## 2017-04-05 DIAGNOSIS — Z0189 Encounter for other specified special examinations: Secondary | ICD-10-CM | POA: Diagnosis not present

## 2017-04-05 DIAGNOSIS — Z7982 Long term (current) use of aspirin: Secondary | ICD-10-CM | POA: Diagnosis not present

## 2017-04-05 DIAGNOSIS — Z9981 Dependence on supplemental oxygen: Secondary | ICD-10-CM | POA: Diagnosis not present

## 2017-04-05 DIAGNOSIS — Z87891 Personal history of nicotine dependence: Secondary | ICD-10-CM | POA: Diagnosis not present

## 2017-04-05 DIAGNOSIS — C494 Malignant neoplasm of connective and soft tissue of abdomen: Secondary | ICD-10-CM | POA: Diagnosis not present

## 2017-04-05 DIAGNOSIS — Z7951 Long term (current) use of inhaled steroids: Secondary | ICD-10-CM | POA: Diagnosis not present

## 2017-04-06 DIAGNOSIS — C494 Malignant neoplasm of connective and soft tissue of abdomen: Secondary | ICD-10-CM | POA: Diagnosis not present

## 2017-04-06 DIAGNOSIS — Z510 Encounter for antineoplastic radiation therapy: Secondary | ICD-10-CM | POA: Diagnosis not present

## 2017-04-07 DIAGNOSIS — Z510 Encounter for antineoplastic radiation therapy: Secondary | ICD-10-CM | POA: Diagnosis not present

## 2017-04-07 DIAGNOSIS — C494 Malignant neoplasm of connective and soft tissue of abdomen: Secondary | ICD-10-CM | POA: Diagnosis not present

## 2017-04-08 DIAGNOSIS — C494 Malignant neoplasm of connective and soft tissue of abdomen: Secondary | ICD-10-CM | POA: Diagnosis not present

## 2017-04-08 DIAGNOSIS — Z510 Encounter for antineoplastic radiation therapy: Secondary | ICD-10-CM | POA: Diagnosis not present

## 2017-04-12 DIAGNOSIS — Z510 Encounter for antineoplastic radiation therapy: Secondary | ICD-10-CM | POA: Diagnosis not present

## 2017-04-12 DIAGNOSIS — C494 Malignant neoplasm of connective and soft tissue of abdomen: Secondary | ICD-10-CM | POA: Diagnosis not present

## 2017-04-13 DIAGNOSIS — C494 Malignant neoplasm of connective and soft tissue of abdomen: Secondary | ICD-10-CM | POA: Diagnosis not present

## 2017-04-13 DIAGNOSIS — Z510 Encounter for antineoplastic radiation therapy: Secondary | ICD-10-CM | POA: Diagnosis not present

## 2017-04-14 DIAGNOSIS — C494 Malignant neoplasm of connective and soft tissue of abdomen: Secondary | ICD-10-CM | POA: Diagnosis not present

## 2017-04-14 DIAGNOSIS — Z510 Encounter for antineoplastic radiation therapy: Secondary | ICD-10-CM | POA: Diagnosis not present

## 2017-04-15 DIAGNOSIS — C494 Malignant neoplasm of connective and soft tissue of abdomen: Secondary | ICD-10-CM | POA: Diagnosis not present

## 2017-04-15 DIAGNOSIS — Z510 Encounter for antineoplastic radiation therapy: Secondary | ICD-10-CM | POA: Diagnosis not present

## 2017-04-18 DIAGNOSIS — Z510 Encounter for antineoplastic radiation therapy: Secondary | ICD-10-CM | POA: Diagnosis not present

## 2017-04-18 DIAGNOSIS — C494 Malignant neoplasm of connective and soft tissue of abdomen: Secondary | ICD-10-CM | POA: Diagnosis not present

## 2017-04-19 DIAGNOSIS — Z510 Encounter for antineoplastic radiation therapy: Secondary | ICD-10-CM | POA: Diagnosis not present

## 2017-04-19 DIAGNOSIS — C494 Malignant neoplasm of connective and soft tissue of abdomen: Secondary | ICD-10-CM | POA: Diagnosis not present

## 2017-04-20 DIAGNOSIS — C494 Malignant neoplasm of connective and soft tissue of abdomen: Secondary | ICD-10-CM | POA: Diagnosis not present

## 2017-04-20 DIAGNOSIS — Z510 Encounter for antineoplastic radiation therapy: Secondary | ICD-10-CM | POA: Diagnosis not present

## 2017-04-21 DIAGNOSIS — C494 Malignant neoplasm of connective and soft tissue of abdomen: Secondary | ICD-10-CM | POA: Diagnosis not present

## 2017-04-21 DIAGNOSIS — Z510 Encounter for antineoplastic radiation therapy: Secondary | ICD-10-CM | POA: Diagnosis not present

## 2017-04-22 DIAGNOSIS — Z0189 Encounter for other specified special examinations: Secondary | ICD-10-CM | POA: Diagnosis not present

## 2017-04-22 DIAGNOSIS — I371 Nonrheumatic pulmonary valve insufficiency: Secondary | ICD-10-CM | POA: Diagnosis not present

## 2017-04-22 DIAGNOSIS — R1903 Right lower quadrant abdominal swelling, mass and lump: Secondary | ICD-10-CM | POA: Diagnosis not present

## 2017-04-22 DIAGNOSIS — I517 Cardiomegaly: Secondary | ICD-10-CM | POA: Diagnosis not present

## 2017-04-22 DIAGNOSIS — C494 Malignant neoplasm of connective and soft tissue of abdomen: Secondary | ICD-10-CM | POA: Diagnosis not present

## 2017-04-25 DIAGNOSIS — C494 Malignant neoplasm of connective and soft tissue of abdomen: Secondary | ICD-10-CM | POA: Diagnosis not present

## 2017-04-25 DIAGNOSIS — Z510 Encounter for antineoplastic radiation therapy: Secondary | ICD-10-CM | POA: Diagnosis not present

## 2017-04-26 DIAGNOSIS — C494 Malignant neoplasm of connective and soft tissue of abdomen: Secondary | ICD-10-CM | POA: Diagnosis not present

## 2017-04-26 DIAGNOSIS — Z510 Encounter for antineoplastic radiation therapy: Secondary | ICD-10-CM | POA: Diagnosis not present

## 2017-04-27 DIAGNOSIS — C494 Malignant neoplasm of connective and soft tissue of abdomen: Secondary | ICD-10-CM | POA: Diagnosis not present

## 2017-04-27 DIAGNOSIS — Z510 Encounter for antineoplastic radiation therapy: Secondary | ICD-10-CM | POA: Diagnosis not present

## 2017-04-28 DIAGNOSIS — K8020 Calculus of gallbladder without cholecystitis without obstruction: Secondary | ICD-10-CM | POA: Diagnosis not present

## 2017-04-28 DIAGNOSIS — J9611 Chronic respiratory failure with hypoxia: Secondary | ICD-10-CM | POA: Diagnosis not present

## 2017-04-28 DIAGNOSIS — J9612 Chronic respiratory failure with hypercapnia: Secondary | ICD-10-CM | POA: Diagnosis not present

## 2017-04-28 DIAGNOSIS — I700 Atherosclerosis of aorta: Secondary | ICD-10-CM | POA: Diagnosis not present

## 2017-04-28 DIAGNOSIS — Z01818 Encounter for other preprocedural examination: Secondary | ICD-10-CM | POA: Diagnosis not present

## 2017-04-28 DIAGNOSIS — Z7951 Long term (current) use of inhaled steroids: Secondary | ICD-10-CM | POA: Diagnosis not present

## 2017-04-28 DIAGNOSIS — C494 Malignant neoplasm of connective and soft tissue of abdomen: Secondary | ICD-10-CM | POA: Diagnosis not present

## 2017-04-28 DIAGNOSIS — J438 Other emphysema: Secondary | ICD-10-CM | POA: Diagnosis not present

## 2017-04-28 DIAGNOSIS — Z9981 Dependence on supplemental oxygen: Secondary | ICD-10-CM | POA: Diagnosis not present

## 2017-04-28 DIAGNOSIS — R1900 Intra-abdominal and pelvic swelling, mass and lump, unspecified site: Secondary | ICD-10-CM | POA: Diagnosis not present

## 2017-04-28 DIAGNOSIS — Z87891 Personal history of nicotine dependence: Secondary | ICD-10-CM | POA: Diagnosis not present

## 2017-04-28 DIAGNOSIS — I10 Essential (primary) hypertension: Secondary | ICD-10-CM | POA: Diagnosis not present

## 2017-04-29 DIAGNOSIS — C494 Malignant neoplasm of connective and soft tissue of abdomen: Secondary | ICD-10-CM | POA: Diagnosis not present

## 2017-04-29 DIAGNOSIS — Z510 Encounter for antineoplastic radiation therapy: Secondary | ICD-10-CM | POA: Diagnosis not present

## 2017-05-02 DIAGNOSIS — J9611 Chronic respiratory failure with hypoxia: Secondary | ICD-10-CM | POA: Diagnosis not present

## 2017-05-02 DIAGNOSIS — Z510 Encounter for antineoplastic radiation therapy: Secondary | ICD-10-CM | POA: Diagnosis not present

## 2017-05-02 DIAGNOSIS — J9612 Chronic respiratory failure with hypercapnia: Secondary | ICD-10-CM | POA: Diagnosis not present

## 2017-05-02 DIAGNOSIS — I119 Hypertensive heart disease without heart failure: Secondary | ICD-10-CM | POA: Diagnosis not present

## 2017-05-02 DIAGNOSIS — J438 Other emphysema: Secondary | ICD-10-CM | POA: Diagnosis not present

## 2017-05-02 DIAGNOSIS — I2510 Atherosclerotic heart disease of native coronary artery without angina pectoris: Secondary | ICD-10-CM | POA: Diagnosis not present

## 2017-05-02 DIAGNOSIS — C494 Malignant neoplasm of connective and soft tissue of abdomen: Secondary | ICD-10-CM | POA: Diagnosis not present

## 2017-05-03 DIAGNOSIS — C499 Malignant neoplasm of connective and soft tissue, unspecified: Secondary | ICD-10-CM | POA: Diagnosis not present

## 2017-05-03 DIAGNOSIS — C494 Malignant neoplasm of connective and soft tissue of abdomen: Secondary | ICD-10-CM | POA: Diagnosis not present

## 2017-05-04 DIAGNOSIS — J9611 Chronic respiratory failure with hypoxia: Secondary | ICD-10-CM | POA: Diagnosis not present

## 2017-05-04 DIAGNOSIS — J9612 Chronic respiratory failure with hypercapnia: Secondary | ICD-10-CM | POA: Diagnosis not present

## 2017-05-04 DIAGNOSIS — Z510 Encounter for antineoplastic radiation therapy: Secondary | ICD-10-CM | POA: Diagnosis not present

## 2017-05-04 DIAGNOSIS — J438 Other emphysema: Secondary | ICD-10-CM | POA: Diagnosis not present

## 2017-05-04 DIAGNOSIS — C494 Malignant neoplasm of connective and soft tissue of abdomen: Secondary | ICD-10-CM | POA: Diagnosis not present

## 2017-05-04 DIAGNOSIS — I119 Hypertensive heart disease without heart failure: Secondary | ICD-10-CM | POA: Diagnosis not present

## 2017-05-04 DIAGNOSIS — I2510 Atherosclerotic heart disease of native coronary artery without angina pectoris: Secondary | ICD-10-CM | POA: Diagnosis not present

## 2017-05-05 DIAGNOSIS — Z510 Encounter for antineoplastic radiation therapy: Secondary | ICD-10-CM | POA: Diagnosis not present

## 2017-05-05 DIAGNOSIS — C494 Malignant neoplasm of connective and soft tissue of abdomen: Secondary | ICD-10-CM | POA: Diagnosis not present

## 2017-05-06 DIAGNOSIS — J9612 Chronic respiratory failure with hypercapnia: Secondary | ICD-10-CM | POA: Diagnosis not present

## 2017-05-06 DIAGNOSIS — C494 Malignant neoplasm of connective and soft tissue of abdomen: Secondary | ICD-10-CM | POA: Diagnosis not present

## 2017-05-06 DIAGNOSIS — I2510 Atherosclerotic heart disease of native coronary artery without angina pectoris: Secondary | ICD-10-CM | POA: Diagnosis not present

## 2017-05-06 DIAGNOSIS — J9611 Chronic respiratory failure with hypoxia: Secondary | ICD-10-CM | POA: Diagnosis not present

## 2017-05-06 DIAGNOSIS — I119 Hypertensive heart disease without heart failure: Secondary | ICD-10-CM | POA: Diagnosis not present

## 2017-05-06 DIAGNOSIS — Z510 Encounter for antineoplastic radiation therapy: Secondary | ICD-10-CM | POA: Diagnosis not present

## 2017-05-06 DIAGNOSIS — J438 Other emphysema: Secondary | ICD-10-CM | POA: Diagnosis not present

## 2017-05-09 DIAGNOSIS — J9612 Chronic respiratory failure with hypercapnia: Secondary | ICD-10-CM | POA: Diagnosis not present

## 2017-05-09 DIAGNOSIS — C494 Malignant neoplasm of connective and soft tissue of abdomen: Secondary | ICD-10-CM | POA: Diagnosis not present

## 2017-05-09 DIAGNOSIS — I2510 Atherosclerotic heart disease of native coronary artery without angina pectoris: Secondary | ICD-10-CM | POA: Diagnosis not present

## 2017-05-09 DIAGNOSIS — I119 Hypertensive heart disease without heart failure: Secondary | ICD-10-CM | POA: Diagnosis not present

## 2017-05-09 DIAGNOSIS — J9611 Chronic respiratory failure with hypoxia: Secondary | ICD-10-CM | POA: Diagnosis not present

## 2017-05-09 DIAGNOSIS — J438 Other emphysema: Secondary | ICD-10-CM | POA: Diagnosis not present

## 2017-05-11 DIAGNOSIS — J9612 Chronic respiratory failure with hypercapnia: Secondary | ICD-10-CM | POA: Diagnosis not present

## 2017-05-11 DIAGNOSIS — J438 Other emphysema: Secondary | ICD-10-CM | POA: Diagnosis not present

## 2017-05-11 DIAGNOSIS — I2510 Atherosclerotic heart disease of native coronary artery without angina pectoris: Secondary | ICD-10-CM | POA: Diagnosis not present

## 2017-05-11 DIAGNOSIS — I119 Hypertensive heart disease without heart failure: Secondary | ICD-10-CM | POA: Diagnosis not present

## 2017-05-11 DIAGNOSIS — C494 Malignant neoplasm of connective and soft tissue of abdomen: Secondary | ICD-10-CM | POA: Diagnosis not present

## 2017-05-11 DIAGNOSIS — J9611 Chronic respiratory failure with hypoxia: Secondary | ICD-10-CM | POA: Diagnosis not present

## 2017-05-13 DIAGNOSIS — I2510 Atherosclerotic heart disease of native coronary artery without angina pectoris: Secondary | ICD-10-CM | POA: Diagnosis not present

## 2017-05-13 DIAGNOSIS — C494 Malignant neoplasm of connective and soft tissue of abdomen: Secondary | ICD-10-CM | POA: Diagnosis not present

## 2017-05-13 DIAGNOSIS — J9612 Chronic respiratory failure with hypercapnia: Secondary | ICD-10-CM | POA: Diagnosis not present

## 2017-05-13 DIAGNOSIS — J438 Other emphysema: Secondary | ICD-10-CM | POA: Diagnosis not present

## 2017-05-13 DIAGNOSIS — I119 Hypertensive heart disease without heart failure: Secondary | ICD-10-CM | POA: Diagnosis not present

## 2017-05-13 DIAGNOSIS — J9611 Chronic respiratory failure with hypoxia: Secondary | ICD-10-CM | POA: Diagnosis not present

## 2017-05-16 DIAGNOSIS — I2510 Atherosclerotic heart disease of native coronary artery without angina pectoris: Secondary | ICD-10-CM | POA: Diagnosis not present

## 2017-05-16 DIAGNOSIS — C494 Malignant neoplasm of connective and soft tissue of abdomen: Secondary | ICD-10-CM | POA: Diagnosis not present

## 2017-05-16 DIAGNOSIS — J9611 Chronic respiratory failure with hypoxia: Secondary | ICD-10-CM | POA: Diagnosis not present

## 2017-05-16 DIAGNOSIS — J9612 Chronic respiratory failure with hypercapnia: Secondary | ICD-10-CM | POA: Diagnosis not present

## 2017-05-16 DIAGNOSIS — I119 Hypertensive heart disease without heart failure: Secondary | ICD-10-CM | POA: Diagnosis not present

## 2017-05-16 DIAGNOSIS — J438 Other emphysema: Secondary | ICD-10-CM | POA: Diagnosis not present

## 2017-05-19 DIAGNOSIS — J438 Other emphysema: Secondary | ICD-10-CM | POA: Diagnosis not present

## 2017-05-19 DIAGNOSIS — J9612 Chronic respiratory failure with hypercapnia: Secondary | ICD-10-CM | POA: Diagnosis not present

## 2017-05-19 DIAGNOSIS — I119 Hypertensive heart disease without heart failure: Secondary | ICD-10-CM | POA: Diagnosis not present

## 2017-05-19 DIAGNOSIS — J9611 Chronic respiratory failure with hypoxia: Secondary | ICD-10-CM | POA: Diagnosis not present

## 2017-05-19 DIAGNOSIS — I2510 Atherosclerotic heart disease of native coronary artery without angina pectoris: Secondary | ICD-10-CM | POA: Diagnosis not present

## 2017-05-19 DIAGNOSIS — C494 Malignant neoplasm of connective and soft tissue of abdomen: Secondary | ICD-10-CM | POA: Diagnosis not present

## 2017-05-21 DIAGNOSIS — J9612 Chronic respiratory failure with hypercapnia: Secondary | ICD-10-CM | POA: Diagnosis not present

## 2017-05-21 DIAGNOSIS — J438 Other emphysema: Secondary | ICD-10-CM | POA: Diagnosis not present

## 2017-05-21 DIAGNOSIS — C494 Malignant neoplasm of connective and soft tissue of abdomen: Secondary | ICD-10-CM | POA: Diagnosis not present

## 2017-05-21 DIAGNOSIS — I2510 Atherosclerotic heart disease of native coronary artery without angina pectoris: Secondary | ICD-10-CM | POA: Diagnosis not present

## 2017-05-21 DIAGNOSIS — J9611 Chronic respiratory failure with hypoxia: Secondary | ICD-10-CM | POA: Diagnosis not present

## 2017-05-21 DIAGNOSIS — I119 Hypertensive heart disease without heart failure: Secondary | ICD-10-CM | POA: Diagnosis not present

## 2017-05-23 DIAGNOSIS — J9612 Chronic respiratory failure with hypercapnia: Secondary | ICD-10-CM | POA: Diagnosis not present

## 2017-05-23 DIAGNOSIS — J9611 Chronic respiratory failure with hypoxia: Secondary | ICD-10-CM | POA: Diagnosis not present

## 2017-05-23 DIAGNOSIS — I2510 Atherosclerotic heart disease of native coronary artery without angina pectoris: Secondary | ICD-10-CM | POA: Diagnosis not present

## 2017-05-23 DIAGNOSIS — I119 Hypertensive heart disease without heart failure: Secondary | ICD-10-CM | POA: Diagnosis not present

## 2017-05-23 DIAGNOSIS — C494 Malignant neoplasm of connective and soft tissue of abdomen: Secondary | ICD-10-CM | POA: Diagnosis not present

## 2017-05-23 DIAGNOSIS — J438 Other emphysema: Secondary | ICD-10-CM | POA: Diagnosis not present

## 2017-05-27 DIAGNOSIS — I119 Hypertensive heart disease without heart failure: Secondary | ICD-10-CM | POA: Diagnosis not present

## 2017-05-27 DIAGNOSIS — J438 Other emphysema: Secondary | ICD-10-CM | POA: Diagnosis not present

## 2017-05-27 DIAGNOSIS — I2510 Atherosclerotic heart disease of native coronary artery without angina pectoris: Secondary | ICD-10-CM | POA: Diagnosis not present

## 2017-05-27 DIAGNOSIS — C494 Malignant neoplasm of connective and soft tissue of abdomen: Secondary | ICD-10-CM | POA: Diagnosis not present

## 2017-05-27 DIAGNOSIS — J9611 Chronic respiratory failure with hypoxia: Secondary | ICD-10-CM | POA: Diagnosis not present

## 2017-05-27 DIAGNOSIS — J9612 Chronic respiratory failure with hypercapnia: Secondary | ICD-10-CM | POA: Diagnosis not present

## 2017-05-30 DIAGNOSIS — I2510 Atherosclerotic heart disease of native coronary artery without angina pectoris: Secondary | ICD-10-CM | POA: Diagnosis not present

## 2017-05-30 DIAGNOSIS — C494 Malignant neoplasm of connective and soft tissue of abdomen: Secondary | ICD-10-CM | POA: Diagnosis not present

## 2017-05-30 DIAGNOSIS — J9611 Chronic respiratory failure with hypoxia: Secondary | ICD-10-CM | POA: Diagnosis not present

## 2017-05-30 DIAGNOSIS — I119 Hypertensive heart disease without heart failure: Secondary | ICD-10-CM | POA: Diagnosis not present

## 2017-05-30 DIAGNOSIS — J438 Other emphysema: Secondary | ICD-10-CM | POA: Diagnosis not present

## 2017-05-30 DIAGNOSIS — J9612 Chronic respiratory failure with hypercapnia: Secondary | ICD-10-CM | POA: Diagnosis not present

## 2017-06-02 DIAGNOSIS — J9612 Chronic respiratory failure with hypercapnia: Secondary | ICD-10-CM | POA: Diagnosis not present

## 2017-06-02 DIAGNOSIS — I2510 Atherosclerotic heart disease of native coronary artery without angina pectoris: Secondary | ICD-10-CM | POA: Diagnosis not present

## 2017-06-02 DIAGNOSIS — J438 Other emphysema: Secondary | ICD-10-CM | POA: Diagnosis not present

## 2017-06-02 DIAGNOSIS — I119 Hypertensive heart disease without heart failure: Secondary | ICD-10-CM | POA: Diagnosis not present

## 2017-06-02 DIAGNOSIS — C494 Malignant neoplasm of connective and soft tissue of abdomen: Secondary | ICD-10-CM | POA: Diagnosis not present

## 2017-06-02 DIAGNOSIS — J9611 Chronic respiratory failure with hypoxia: Secondary | ICD-10-CM | POA: Diagnosis not present

## 2017-06-06 DIAGNOSIS — I119 Hypertensive heart disease without heart failure: Secondary | ICD-10-CM | POA: Diagnosis not present

## 2017-06-06 DIAGNOSIS — I2510 Atherosclerotic heart disease of native coronary artery without angina pectoris: Secondary | ICD-10-CM | POA: Diagnosis not present

## 2017-06-06 DIAGNOSIS — J438 Other emphysema: Secondary | ICD-10-CM | POA: Diagnosis not present

## 2017-06-06 DIAGNOSIS — J9612 Chronic respiratory failure with hypercapnia: Secondary | ICD-10-CM | POA: Diagnosis not present

## 2017-06-06 DIAGNOSIS — C494 Malignant neoplasm of connective and soft tissue of abdomen: Secondary | ICD-10-CM | POA: Diagnosis not present

## 2017-06-06 DIAGNOSIS — J9611 Chronic respiratory failure with hypoxia: Secondary | ICD-10-CM | POA: Diagnosis not present

## 2017-06-07 ENCOUNTER — Encounter: Payer: Self-pay | Admitting: Pulmonary Disease

## 2017-06-07 ENCOUNTER — Ambulatory Visit (INDEPENDENT_AMBULATORY_CARE_PROVIDER_SITE_OTHER): Payer: Medicare Other | Admitting: Pulmonary Disease

## 2017-06-07 VITALS — BP 124/60 | HR 85 | Ht 66.0 in | Wt 226.6 lb

## 2017-06-07 DIAGNOSIS — I272 Pulmonary hypertension, unspecified: Secondary | ICD-10-CM | POA: Diagnosis not present

## 2017-06-07 DIAGNOSIS — I2510 Atherosclerotic heart disease of native coronary artery without angina pectoris: Secondary | ICD-10-CM | POA: Diagnosis not present

## 2017-06-07 DIAGNOSIS — E669 Obesity, unspecified: Secondary | ICD-10-CM

## 2017-06-07 DIAGNOSIS — I10 Essential (primary) hypertension: Secondary | ICD-10-CM | POA: Diagnosis not present

## 2017-06-07 DIAGNOSIS — M150 Primary generalized (osteo)arthritis: Secondary | ICD-10-CM

## 2017-06-07 DIAGNOSIS — J449 Chronic obstructive pulmonary disease, unspecified: Secondary | ICD-10-CM | POA: Diagnosis not present

## 2017-06-07 DIAGNOSIS — J9612 Chronic respiratory failure with hypercapnia: Secondary | ICD-10-CM

## 2017-06-07 DIAGNOSIS — Z9989 Dependence on other enabling machines and devices: Secondary | ICD-10-CM

## 2017-06-07 DIAGNOSIS — C801 Malignant (primary) neoplasm, unspecified: Secondary | ICD-10-CM | POA: Insufficient documentation

## 2017-06-07 DIAGNOSIS — Z87891 Personal history of nicotine dependence: Secondary | ICD-10-CM

## 2017-06-07 DIAGNOSIS — M159 Polyosteoarthritis, unspecified: Secondary | ICD-10-CM

## 2017-06-07 DIAGNOSIS — I420 Dilated cardiomyopathy: Secondary | ICD-10-CM | POA: Diagnosis not present

## 2017-06-07 DIAGNOSIS — J9611 Chronic respiratory failure with hypoxia: Secondary | ICD-10-CM | POA: Diagnosis not present

## 2017-06-07 DIAGNOSIS — C499 Malignant neoplasm of connective and soft tissue, unspecified: Secondary | ICD-10-CM

## 2017-06-07 DIAGNOSIS — C679 Malignant neoplasm of bladder, unspecified: Secondary | ICD-10-CM | POA: Diagnosis not present

## 2017-06-07 NOTE — Patient Instructions (Signed)
Today we updated your med list in our EPIC system...    Continue your current medications the same...  Continue your OXYGEN and BiPAP as directed...  Continue the DUONEB in NEBULIZER 4 times daily...    And the BUDESONIDE twice daily...   Carry the [facility] rescue inhaler when out & about for as needed use...  Call for any questions or if we can be of service in any way...  Let's plan a follow up visit in 29mo, sooner if needed for problems.Marland KitchenMarland Kitchen

## 2017-06-07 NOTE — Progress Notes (Signed)
Subjective:    Patient ID: [name], male    DOB: 13-Aug-1946, 71 y.o.   MRN: [id number]  HPI 71 y/o WM here for a follow up visit and on-going management of mult med problems...  ~  SEE PREV EPIC NOTES FOR OLDER DATA >>    CXR 7/14 showed COPD/ emphysema, incr markings and scarring at the bases, NAD...  LABS 7/14:  FLP- ok on Simva20;  Chems- ok x BS=120 A1c=6.7;  CBC- wnl;  TSH=1.26;  PSA=0.41...  [name] has established Primary Care follow up w/ [name] at [name]>>   CXR 7/15 showed underlying COPD, mild bibasilar fibrosis, norm heart size, DJD in spine, NAD...   PFT 7/15 showed> FVC=3.18 (78%), FEV1=1.22 (38%), %1sec=38; mid-flows=13% predicted... This is c/w GOLD Stage3 COPD and we discussed this...  ~  December 05, 2014:  432moROV & pulmonary recheck; EEtiennehas continued smoking, perhaps decr slightly to 1/2ppd but unable to quit & he has gained 6# in the interim up to 221# today;  His CC is sinusitis w/ nasal congestion, drainage & green mucus plus maxillary/ frontal HAs; he denies f/c/s, notes stable cough & chest symptoms w/ DOE;  He has been taking his Advair250Bid but prev refused addition of an anticholinergic due to "the donut hole";  States his breathing is "fine, except in cold air" ( we discussed wearing a scarf in winter etc)... He has GOLD Stage3 COPD w/ FEV1=1.22 (38%predicted) done 7/15;;  Exam shows decr BS bilat w/ few scat rhonchi & mild exp wheezing esp w/ forced maneuver...     We reviewed prob list, meds, xrays and labs> see below for updates >> [facility]... PLAN>> we discussed treating his acute max sinusitis w/ Augmentin875Bid, plus align daily & nasal saline as needed; he knows the importance of smoking cessation but is not motivated & declines smoking cessation help; he will continue the Advair250Bid 7 we are adding spiriva via handihaler once per day; we plan ROV in 663mo/ f/u CXR & PFT,  call in the interim for any breathing issues....   ~  June 05, 2015:  32m52moV & ErnJovontaeports a good interval, no new complaints or concerns; still smoking +/-1ppd and blames family stress; notes cough, sm amy yellow sput, no hemoptysis, SOB w/o change/stable- still mows 3 yards, does chores, tends to 20+chickens & their eggs, etc; he has repeatedly declined smoking cessation help, clearly not motivated to quit, not even doing his inhalers regularly!!!    COPD, +smoker> on Advair250Bid & Spiriva daily; symptoms as above, he is stoic, still smoking 1ppd & NOT motivated to quit, asked to cut back & use both inhalers regularly...    HBP> on Metop25Bid, Amlod10, Lisin40; BP=114/64 & he denies CP, palpit, SOB, edema...    CAD> on ASA81; followed by [name] & seen 4/15 (overdue for f/u)> CAD, IWMI 1995, mult PCIs, last Myoview 2013 was low risk; too sedentary, no changes made; 73 50o brother died w/ AAA rupture...    Chol> on Simva20; FLP 7/14 showed TChol 158, TG 145, HDL 33, LDL 96; needs better diet & wt reduction and ret for Fasting blood work overdue.    DM> on diet alone; LABS 7/14 showed BS= 120, A1c= 6.7; we reviewed low carb diet, exercise program, and wt reduction...    Obese> wt recorded as 204# today but it doesn't look like he's lost 26#; we reviewed diet &  exercise...    GI- Gastritis, polyps> prev on Zantac; prev colon 2004 w/ hyperplastic polyp removed & f/u 8/14 by [name] w/ 3 polyps removed (5-60m size, all were tubular adenomas), +divertics in sigmoid, f/u planned 5 yrs... NOTE> exam showed a small palp knot in mid-abd above the umbilicus ?etiology (I offered CT Abd but he declines & wants to discuss w/ his primary- [name]).    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    DJD> on OTC analgesics  as needed...    Anxiety> on [name]... We reviewed prob list, meds, xrays and labs> see below for updates >>  NOTE> HE IS FOLLOWED BY [name] for GEN MED/ PCP now...  CXR 06/05/15 showed stable heart size, Ao atherosclerosis & tortuousiy, COPD/emphysema, incr markings at the bases, arthritis in Tspine...  LABS 7/16: pt asked to ret for fasting blood work... IMP/PLAN>>  [name] he is stable but continues to smoke 1ppd; CXR w/ COPD/emphysema, atherosclerotic Ao, chronic changes but NAD- we discussed the low-dose screening CT Chest for the early detection of lung cancer (he is a candidate & he will consider participating in this program); in the meanwhile- continue Advair/ Spiriva regularly;  He is overdue for Cards f/u w/ [name], and needs to continue Q6mo29mo w/ [name] (w/ copy of notes to us);[name] will ret for FASTING blood work.  ADDENDUM>> given his age and smoking history, [name] candidate for LUNG CANCER SCREENING w/ LOW-DOSE CT Chest>>  This office visit constitutes a face-to-face visit for the purpose of lung cancer screening counseling and a shared decision making visit...  Eligibility for LDCT screening:  1) Age=70 (must be 55-77y/o);  2) Absence of symptoms of lung cancer: he denies CP, ch in SOB, hemoptysis.  Smoking History:  1) He has 60+ pk-yrs (must have >30 pk-yr hx smoking);  2) Smoking status- still smoking ~1ppd (current or former smoker who quit<15 yrs ago).  Counseling:  Importance of smoking cessation &/or continued abstinence discussed ; offered smoking cessation interventions- pt declined; pt agreed to continued LDCT screening annually.  Shared decision making:  We reviewed the potential benefits and harms of screening> eg. Early detection of lung cancer and improved survival, the need for additional diagnostic tests & possible surgery, the risk of over-diagnosis/ false positives/ and radiation exposure... All questions were answered to the best  of my ability & he would like to consider the LDCT screening & he will let us kKoreaw his decision=> he ignored this discussion & never called.  ~  October 31, 2015:  5 month ROV & post [name] follow up visit>  [name] was Adm 12/5 - 10/29/15 by [facility] w/ acute hypoxemic resp failure precipitated by a COPD exac; for his part he noted incr SOB, cough, green sput, & "slowing down", he denied f/c/s, no CP, etc;  In the ER he was hypoxemic w/ O2sats in the 70s on RA and eval revealed> CXR- COPD, bullous dis, incr markings at bases, NAD; Cultures of blood/ sput/ urine were all neg; Serologies for strep & legionella were neg; Viral panel was neg;  LABs were OK (WBC=7.9=>14, Hg=15.3=>13.9, BS=100-140, BNP=498);  EKG showed NSR, rate78, NSSTTWA;  2DEcho 10/21/15 showed mildLVH, norm LVF w/ EF=55-60%, no regional wall motion abn, LA-mod  dil, RA- mod to severe dil, RV- mod dil, mod TR, PAsys-70mHg... He was treated w/ Oxygen, IV Solumedrol, Rocephin/ Zithromax, NEBS, Lovenox, flutter, etc; he was disch on Home O2- 2L/min activ & 1L/min rest, Pred taper, Levaquin, Advair250/ Spiriva...  He returns today feeling better, notes his home pulse-ox checks are improving & he has less SOB/DOE, less cough/sput, etc;  He says he has not smoked since PTA...      EXAM shows Afeb, VSS, O2sat=94% on 2L/min [place];  Wt=210# (he was 204 prev);  HEENT- neg, mallampati1;  Chest- decr BS bilat, few scat rhonchi, no w/r/consolidation;  Heart- RR w/o m/r/g;  Abd- obese, soft;  Ext- neg w/o c/c/e...  IMP/PLAN>>  COPD/emphysema, GOLD Stage 3, chronic hypoxemic resp failure- on Home O2 now, exsmoker- quit 10/2015, pulmonary HTN (PAsys by EClarene Duke- WHO group 3>  I congratulated him on not smoking & we discussed continuing the Oxygen regularly (esp due to the [name], Prednisone taper, Advair, Spiriva, NEBS w/ Albut, Mucinex/flutter;  He will follow up in 3 wks when the Pred has been tapered & we briefly discussed the need for Full PFTs and CT Chest later...    ~  November 21, 2015:  3wk ROV & MrWicker reports that he feels much better, SOB improved, cough improved, still prod of a sm amt green sput w/o blood w/o f/c/s;  He's been using the NEB w/ Albut TID followed by his AdvairBid & SpirivaQd; he is off the Pred now, he is too sedentary & not exercising => he needs PULM REHAB & we will refer, [facility] we will inquire as to what he might be elig for...       EXAM shows Afeb, VSS, O2sat=98% on 2L/min [place];  Wt=216# (he was 204-210# prev);  HEENT- neg, mallampati2;  Chest- decr BS bilat, otherw clear w/o w/r/r;  Heart- RR w/o m/r/g;  Abd- obese, soft;  Ext- neg w/o c/c/e...  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 We plan ROV in 6 wks.  ~  February 03, 2016:  2-376moOV & [name] w/ his severe obstructive lung dis, GOLD Stage 3, chr hypoxemic resp failure w/ pulmHTN (WHO group3); he quit smoking 10/2015 & is using home O2 3L/min, NEBS w/ Albut tid, Advair250Bid, Spiriva daily, Mucinex etc... He reports that breathing is stable overall but he is c/o 51m86mo URI w/ cough, thick yellow sput hard to produce, incr SOB 7 chest tightness but no fever, no CP=> he wants another antibiotic... He spent much time ventilating about his problems w/ [facility]ME[facility], his O2 set-up, & [facility] program- they still haven't contacted him about starting Rehab... He does have mult entries into EPIC from [employer] reviewed...     EXAM shows Afeb, VSS, O2sat=92% on 3L/min [place];  Wt=231# (up 15# & he was 204-210# prev);  HEENT- neg, mallampati2;  Chest- decr BS bilat, otherw clear w/o w/r/r;  Heart- RR w/o m/r/g;  Abd- obese, soft;  Ext- neg w/o c/c/e.  PFT 02/03/16>  FVC=2.57 (68%), FEV1=1.05 (38%), %1sec=41, mid-flows reduced at 18% predicted; post bronchodil  FEV1 w/o change;  TLC=6.67 (107%), RV=3.97 (176%), RV/TLC=60;  DLCO=22% and DL/VA=29%... This is c/w a severe obstructive ventilatory defect w/ air trapping and severely decr DLCO c/w emphysema- GOLD Stage  3 COPD... IMP/PLAN>>  We discussed continuing the NEBS Tid followed by Advair250Bid & Spiriva daily; add Mucinex 1279mBid & we will Rx w/ Levaquin500/Andrew x7d for his bronchitic infection (see AVS);  He still needs a CT Chest but wants to wait for now;  [name], and he wants diff DME supplier- we will inquire, plan ROV 378mo. ADDENDUM 03/18/16>> Pt unable to afford inhalers- ADVAIR & SPIRIVA discontinued and placed on NEBULIZER w/ DUONEB QID & PULMICORT 0.25 BID regularly (we will update med list to reflect this change).  ~  May 05, 2016:  54m78moV & [name] notes DOE w/ exertion- he is in PulHealth Netusing up to 6L/min w/ exercise (he tried Oxy[employer] 1 Automotiveusing 3-4L/min at rest & Qhs; he feels that he is making some progress & I reminded him the DIET + EXERCISE and wt loss would be very beneficial to his breathing; states he is walking & mowing on his off days (ie- when not in rehab); despite everything his wt continues to climb- now up 4# to 235# "exercise makes me hungry"... we reviewed the following [name] problems during today's office visit >> his new PCP is [name]...    COPD, ex-smoker, chr hypoxemic RF> on NEB w/ Duoneb Qid + Budes-Bid + HFA prn (Advair & Spiriva too $$); he finally quit smoking, symptoms as above, he is now in pulm rehab but having issues w/ low sats...    HBP> on MetopER-78m51mM&1PM), Amlod10, Lisin10, Lasix40Bid; BP=136/66 & he denies CP, palpit, SOB, edema...    CAD> on ASA81, Imdur30; followed by [name] & seen 04/2016> CAD, IWMI 1995, mult PCIs, last Myoview 2013 was low risk; too sedentary, edema decr w/ Lasix; 73 y39 brother died w/ AAA rupture...    Chol> on Simva20; FLP 12/16 showed TChol 138, TG 75, HDL 25, LDL 98; needs better diet & wt  reduction and better compliance.    DM> on diet alone; LABS 5/17 showed BS= 118, A1c= per PCP; we reviewed low carb diet, exercise program, and wt reduction...    Obese> wt = 235#, 5'6"Tall, BMI=38;  we reviewed diet & exercise...    GI- Gastritis, polyps> prev on Zantac; prev colon 2004 w/ hyperplastic polyp removed & f/u 8/14 by [name] w/ 3 polyps removed (5-7mm 39me, all were tubular adenomas), +divertics in sigmoid, f/u planned 5 yrs... NOTE> exam showed a small palp knot in mid-abd above the umbilicus ?etiology (I offered CT Abd but he declines & wants to discuss w/ his primary- [name]).    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     DJD> on OTC analgesics as needed...    Anxiety> on Alpraz0.5mg pr7m. EXAM shows Afeb, VSS, O2sat=92% on 3L/min [place];  Wt=231# (up 15# & he was 204-210# prev);  HEENT- neg, mallampati3;  Chest- decr BS bilat, otherw clear w/o w/r/r;  Heart- RR w/o m/r/g;  Abd- obese, soft;  Ext- neg w/o c/c/e. IMP/PLAN>>  [name] is rec to continue current meds regularly, get on diet & get wt down , continue in PulmRehab, etc; we wrote for a ProairHFA rescue inhaler for prn use...   ~  August 05, 2016:  54mo ROV59moast visit we rec continuing same meds but stressed DIET, EXERCISE, wt reduction-- unfortunately he  has GAINED 8# up to 243# (BMI=39) in the interval;  He states that "I don't get hungry, I only eat one sandwich & sometimes a salad at night;  He says "they kicked me out" of the [facility] program due to low oxygens even w/ use of the Oxymizer;  He says he is exercising at home now;  He has established PCP w/ [name] & apparently he did labs which pt reports were "OK".    [name] c/o cough, chest congestion, yellow sput, ans a "sinus infection" w/ yellow drainage; notes incr SOB but denies CP/  f/c/s, edema;   EXAM shows Afeb, VSS, O2sat=92% on 3L/min [place];  Wt=231# (up 15# & he was 204-210# prev);  HEENT- neg, mallampati3;  Chest- decr BS bilat, otherw clear w/o w/r/r;  Heart- RR w/o m/r/g;  Abd- obese, soft;  Ext- neg w/o c/c/e. IMP/PLAN>>  [name] describes an upper resp infection & COPD exac=> Rx w/ Levaquin, Pred77m-4d taper, + his NEBS w/ duoneb Qid & Budes Bid; he desperately needs to get the weight down...  ~  October 13, 2016:  ETenochreturns and says "I've been worse" & indicates that he really likes the BiPAP he was placed on & disch with after his last Hosp 11/10 - 09/29/16; records reviewed by me- Adm w/ cough, yellow sput, chills and incr SOB; CXR revealed COPD, incr markings & bibasilar atx; Sput/ blood cultures and resp viral panel were all neg; LABS showed WBC~13K, anemia w/ Hg~11, Chems- ok x elev BS (his PCP is [name]); BNP was 29; ABGs showed pH=7.38, pCO2=58, pO2=79 on [place] O2; note that Bicarb level ~30;  2DEcho showed EF 60-65%, Gr1DD, PAsys~757mg;  He was felt to has a COPD exac, Cor Pulmonale- treated w/ BiPAP (he was INTOL to CPAP), given IV Solumedrol (DC on Pred taper), IV Zithromax, NEB treatments and improved...    He now returns & notes that he likes the BiPAP, feels like it is really helping (resting better, sleeps thru the night, wakes refreshed, no daytime sleepiness, & naps 2/7 days now) & wants to continue the BiPAP; NOTE that we had rec a sleep study to him on mult occas but he has always refused!  He now agrees to a sleep study, hoping that it will lead to medicare approval for his home BiPAP => HE QUALIFIES FOR BiPAP BASED ON COPD DIAGNOSIS w/ ABG SHOWING pCO2>52 ON HIS NASAL CANNULA O2, he needs the SLEEP OXIMETRY STUDY confirming O2sat<88% for cumulative >77m21mwhile on his prescribed O2, and OSA/CPAP treatment have been considered & ruled out... He has a home O2conc & a POC which he uses 3-4L pulse dose w/ his home sats in the 90s at rest & down to 88% w/  exerc...     COPD, mixed type w/ emphysema>  He has GOLD Stage3 COPD & on O2 as noted, NEBS w/ DUONEB Qid & BUDESONIDE0.25Bid, plus ProairHFA prn when out & about; off the Pred Rx [name]...    Ex-smoker>  He quit late in 2016...    Chronic resp failure w/ hypoxemia and hypercarbia>  On above meds + ASA, Metoprolol, Imdur, Lasix    Cor pulmonale, secondary pulm hypertension>  He is on O2 24/7 using 4L/min continuous at night & 4L/min pulse dose during the day...    CARDIAC issues>  HBP, CAD w/ IWMI 1995 & mult PCIs, HL (on Simva20)- all followed by [name]...    [name] issues>  HBP,  HL, DM, obesity, gastritis, divertics, colon polyps, BPH, hx bladder cancer, DJD, anxiety EXAM shows Afeb, VSS, O2sat=97% on 4L/min [place];  Wt=240#;  HEENT- neg, mallampati3;  Chest- decr BS bilat, otherw clear w/o w/r/r;  Heart- RR w/o m/r/g;  Abd- obese, soft;  Ext- neg w/o c/c/e.  CXR 09/26/16>  Norm heart size, incr bibasilar atx... IMP/PLAN>>  [name] needs to continue his O2, NEBS w/ Duoneb & Budes regularly; he wants to continue the BiPAP & we need to see if he meets Medicare's criteria for BiPAP device- proceed w/ Sleep Study...   ADDENDUM>>  Sleep Study 10/15/16>  AHI=1.8/hr (8 hypopneas & 4 RERAs;  AHI during REM = 17.6/hr;  Study done on 4LO2 & min O2sat= 88%;  Mod snoring noted;  No arrhythmias;  PLMS index= 37, but assoc arousals was only 0.2;  He appears to meet the criteria for BiPAP & we will submit to [employer]/ [employer]... Plan ROV recheck in 6-8 weeks.  ~  November 24, 2016:  6wks ROV and pulmonary follow up visit>  [name] is stable w/ his severe disease- on BiPAP Qhs, Home O2- 24/7, NEB w/ Duoneb Qid & Budes Bid, he has rescue inhaler and [place] nasal mist;  His weight is all the way up to 257# (up 18# in 6wks) and c/o cough off & on, small amt yellow sput, no f/c/s, SOB "the same" but notes incr DOE w/ walking; unfortunately he has been very sedentary- not exercising at all  (prev in pulm rehab & they discharged him from the program)...     SEE PROBLEM LIST-- above... EXAM shows Afeb, VSS, O2sat=97% on 4L/min [place];  Wt=240#;  HEENT- neg, mallampati3;  Chest- decr BS bilat, otherw clear w/o w/r/r;  Heart- RR w/o m/r/g;  Abd- obese, soft;  Ext- neg w/o c/c/e.  LABS 11/24/16>  Chems- ok w/ BS=117, Cr=0.64, LFTs=wnl;  CBC- anemic w/ Hg=10.53mv=95, wbc-norm;  Fe=191 (sat=56%), Ferritin=109;  TSH=1.28...  Abd SONAR => ordered but never done...  Last CT Chest 08/19/16> mild cardiomeg w/ aortic & coronary atherosclerotic changes & PA enlargement w/ 3.4cm outflow tract; no adenopathy; severe bullous emphysema; bilat pulm nodules w/ 7.260mRUL nodule & 7.9 LLL lesion; Abd showssmall gallstones vs sludge & 6cm right renal cyst, mod thoracic spondylosis...  Last 2DEcho 09/29/16> mod conc LVH, norm LVF w/ EF=60-65%, no regional wall motion abn, Gr1 DD, norm valves, RV dil w/ norm sys function, PAsys=74 mmHg...  Last ONO > cannot find results IMP?PLAN>>  ErRadinas severe COPD/emphysema w/ pulmHTN & CorPulmonale; he refuses to fill inhalers due to costs & he is therefore on O2- 24/7, BiPAP nightly, NEBS w/ Duoneb Qid & Budes Bid, Mucinex 1200Bid, & asked to get into a regular exercise program;  He will continue regular f/u w/ CARDS- [name]; must work on weight reduction...  ~  January 27, 2017:  11m73moV & [name] was ADM by [facility] 3/7 - 01/26/17 w/ Acute on Chronic diastolic CHF, dilated cardiomyopathy, COPD exac, acute on chr resp failure;  He was diureses 30# of fluid 257# to 227# today but he is quite vehemently opposed to this being diastolicCHF and says it was pneumonia & wouldn't have happened if he had an antibiotic when he needed it (his words); Pulm consult from DrYacoub also remarked about pt being very upset at suggestion of CHF & demanded to see pulmonary...             ...Marland KitchenMarland Kitchen CXR 01/22/17 showed cardiomegaly, aortic atherosclerosis, bilat emphysema &  interstitial prominence in the  bases, no consolidation or effusion..  EKGs 01/2017 showed NSR, poor R prog V1-2, one PVC pair...  2DEcho 01/20/17 was a poor quality study  But showed mild conc LVH (prev mod), norm LVF w/ EF=55-60% & no RWMA, Ao root mildly dil, AoV mild calcif leaflets, MV norm, LAdil 61m, RV dil, norm RVF, PAsys- not meas [id number]  LABS showed BS~130-180 range, Cr=0.8-1.1 range, Hg~10-11 range, WBC~10=>6K, Troponins- neg, BNP=35...    He was treated w/ [id number] placed on BiPAP, Oxygen, NEBS, Solumedrol, & empiric antibiotics; He was seen by Pulmonary & transitioned to oral Levaquin, Prednisone, & continued on his home regimen of Oxygen- 24/7, BiPAP Qhs, NEBS w/ DUONEB Qid & Budesonide Bid, Mucinex '1200mg'$  Bid; Lasix trimmed to '40mg'$ Bid at disch yest... Since disch he is c/o "weak", notes some cough, sput, & SOB; we discussed keeping an Ab on hand so he can take it when he wants at the earliest sign of a resp infection- Levaquin '500mg'$  daily #10 w/ refill written at his request, reminded to always take a probiotic like ALIGN & activia yogurt while on the broad spectrum antibiotics...     EXAM shows Afeb, VSS, O2sat=97% on 4L/min [place];  Wt=227#;  HEENT- neg, mallampati3;  Chest- decr BS bilat, otherw clear w/o w/r/r;  Heart- RR w/o m/r/g;  Abd- obese, soft;  Ext- neg w/o c/c/e. IMP/PLAN>>  EKatlinhas severe end-stage COPD- mixed chr bronchitis & emphysema w/ chr hypoxemia & hypercarbic resp failure, w/ Hx pulmHTN & right heart failure;  Now improved and approaching his baseline on max meds=> O2 at 2-4L/min 24/7 (he has [employer] that supplies 2L/min continuous or up to 6L/min pulse dose flow), nocturnal BiPAP, NEBs w/ Duoneb Qid & Budes 0.25Bid, Mucinex '1200mg'$ Bid, weaning oral Prednisone from his recent hosp, finished oral Levaquin; on ASA81, Lasix40Bid, Avapro75, Imdur30, K20, Zocor20, and off prev Metoprolol... We discussed continuing the Pred taper til gone, continue other meds the same for now & ROV recheck 137mo.  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  ADDENDUM>> CT Chest 02/17/17 (57m21mollow up CT Chest- compare to 08/19/16)>> mild cadiomeg w/ atherosclerotic calcif in Ao/ great vessels/ coronaries; no enlarged adenopathy; extensive bullous emphysema, areas of mild interstitial prominence, stable nodular opacities (largest 1.3 x 0.9 cm in inferior LLL abutting the pleura) & no new parenchymal lung opacity; upper abd shows atherosclerotic calcif of Ao, right renal cyst, & an incompletely vis mass in mid abd=> needs CT Abd...  ADDENDUM>> CT Abd & Pelvis 02/18/17 showed a large 25 x 16 x 18 cm mass in right abd mesentary, no adenopathy, additional findings include ao & coronary calcif, mod centrilob emphysema, diffuse hepatic steatosis, cholelithiasis & mod left colonic diverticulosis...   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);  [name] the [facility]/ eval>>     [place]> Dx w/ soft tissue sarcoma right abd, felt to be high risk for surg due to COPD & cardiac issues=> [place] [facility]  XRT by DrBlackstock- [facility] 03/18/17>  They planned 45 Gy in 25 fractions- completed by 05/06/17, taking imodium for loose stools    Pulmonary consult from DrPascual 04/28/17>  He felt that abd surg would pose a high risk of pulm complications given his age, chronic hypercarbic & hypoxemic resp failure, poor exercise capacity, & pulm HTN; in addition they felt he was on a good triple therapy regimen & did not pose any changes...    Pt notes that his abd has shrunk & his breathing is sl  better; still using 4-6L/min O2 & checks his pulse ox readings; he notes the O2 also helps his leg cramps "the nurse says it's oxygen deprivation" (using [facility]); he remains on BiPAP Qhs & rests well, wakes refreshed, denies daytime sleepiness;  He has been regular w/ his NEB rx using Duoneb Qid & Budesonide Bid + AlbutHFA rescue inhaler as needed...     EXAM shows Afeb, VSS, O2sat=99% on 4L/min [place];  Wt=227#;  HEENT- neg, mallampati3;  Chest- decr BS bilat, otherw clear w/o w/r/r;  Heart- RR w/o m/r/g;  Abd- protuberant abd, soft & nontender, norm BS;  Ext- VI, +1edema.  Last CXR was 01/22/17 showing cardiomegaly, aortic atherosclerosis, bilat emphysema & interstitial prominence in the bases, no consolidation or effusion..  Last CT as above - [facility] plans repeat abd CT in August (after the XRT treatment)...  PFTs in Care Everywhere>  [facility] 04/28/17 showed FVC=2.60 (70%), FEV1=0.97 (36%), %1sec=37%...  Labs in Care everywhere 04/2017>  [place]- ok x BS=125, Cr=0.64, LFTs=wnl... 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          Problem List:  Hx of SINUSITIS (ICD-473.9) - off Nasonex now & recent Rx w/ Augmentin/ Saline... notes occas sinus congestion, drainage, sl HA, etc... he knows that he needs to quit smoking! ~  10/10: Adm for epistaxis w/ eval by DrWolicki- CXR= NAD, labs OK, rec for saline lavage- nose bleed resolved & disch home (no recurrence so far). ~  1/16: he presented w/ acute max & frontal sinusitis> Rx w/ Augmentin, Align, Saline, Mucinex, etc... ~  3/17: he has acute bronchitic exac- treated w/ Levaquin x7d + his regular meds...  COPD  (ICD-496) >>   ~  on ADVAIR 250Bid; STILL SMOKES CIGARETTES & he's refused Chantix...  ~  He had hemoptysis 6/99 without lesion on CXR, CTChest, or Bronchoscopy... ~  Adm 10/10 w/ epistaxsis & eval DrWolicki as noted> he wanted to do Sleep Study but pt cancelled it & declined to resched. 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 ~  CXR 5/12 showed ectatic calcif & tort Aorta, COPD w/ incr markings ar bases, NAD, +degen spondylosis. ~  CXR 6/13 showed COPD/ emphysema, scarring in the lower lobes, otherw clear lungs, DJD in spine... ~  CXR 7/14 showed COPD/ emphysema, incr markings and scarring at the bases, NAD. ~  1/15: [name] says he's decr his smoking from prev 3-4ppd down to 3 cig/Andrew;  He remains on Advair250> states breathing is ok, at baseline CAT score=12; baseline cough, sput, no hemoptysis, stable DOE, no edema... ~  CXR  7/15 showed underlying COPD, mild bibasilar fibrosis, norm heart size, DJD in spine, NAD...  ~  PFT 7/15 showed> FVC=3.18 (78%), FEV1=1.22 (38%), %1sec=38; mid-flows=13% predicted... This is c/w GOLD Stage3 COPD and we discussed this> He is rec to quit all smoking immediately; he has been using the nicotine gum & rec to try patches, offered Wellbutrin, he refuses Chantix; needs to continue ADVAIR250Bid & samples givem; he declines to start [place] at this time due to cost & donut hole (we will address this again on return); reminded to use MUCINEX '600mg'$ - 1to2 Bid w/ fluids, and OK to use OTC DELSYM as needed for cough...   ~  1/16: he claims stable on Advair250 but still smoking & can't vs won't quit; we decided to add Spiriva daily via handihaler... 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 ~  12/16:  He  was [name] for 9d w/ hypoxemic resp failure, COPD exac, and found to have pulmHTN w/ 2DEcho showing PAsys=51;  Treated w/ O2, Pred taper, antibiotics, NEBS, Advair/Spiriva, etc;  He says he has quit smoking... ~  [name] 10/2015> CXR- COPD, bullous dis, incr markings at bases, NAD; Cultures of blood/ sput/ urine were all neg; Serologies for strep & legionella were neg; Viral panel was neg; He was treated w/ Oxygen, IV Solumedrol, Rocephin/ Zithromax, NEBS, Lovenox, flutter, etc; He was disch on Home O2- 2L/min [place] & 1L/min rest, Pred taper, NEBS, Levaquin, Advair250/ Spiriva => improved. 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   HE HAS ESTABLISHED PRIMARY CARE w/ [name], [place] w/ [name] et al >>   HYPERTENSION (ICD-401.9) - controlled on METOPROLOL '25mg'$ Tid,  NORVASC '10mg'$ /Andrew, LISINOPRIL '40mg'$ /Andrew...   ~  11/11:  BP=122/70, not checking BP's at home... denies HA, fatigue, visual changes, CP, palipit, dizziness, syncope, dyspnea, edema, etc. ~  5/12:  BP= 120/72, pt very pleased w/ his wt loss & improved exercise ability. ~  6/13:  BP= 122/64 & feeling well w/o CP, palpit, SOB, edema, etc... ~  12/13:  BP=122/82 & he denies CP, palpit, SOB, edema... ~  7/14: on Metop25-3/Andrew, Amlod10, Lisin40; BP=130/78 & he denies CP, palpit, SOB, edema ~  1/15: BP remains stable on Metop25-2AM & 1PM, Amlod5, Lisin40; BP= 120/72 and he denies CP, palpit, ch in SOB, edema. ~  2016> His meds have been adjusted- now on Rx w/ Amlod10, Lisin10; BP= 130/72 7 he denies CP, palpit, edema; chr SOB/DOE due to his COPD.  CORONARY ARTERY DISEASE (ICD-414.00) - ASA '81mg'$ /Andrew & off Plavix as of 2/13 officially as he wasn't taking it regularly  anyway. ~  Hx prev IWMI and PTCA/ stent in RCA by DrBrodie 12/95 & 5/96...  ~  Myoview 4/08 abnormal w/ inferior ischemia and subseq cath revealing restenoses in RCA- s/p repeat PTCA/ stents...  ~  f/u Myoview 9/08 w/ prev infer infarct, no ischemia, EF=56%... ~  9/11:  pre op cardiac eval  DrBrodie> OK to hold ASA/ Plavix for bladder surg. ~  2/12: f/u [name] w/ hx CAD, MI in 95, mult PCIs last 2009> stable, on ASA/ Plavix, no changes made; EKG= NSR, WNL... ~  2/13:  He had f/u [name] w/ Myoview showing a fixed defect in infer wall, no ischemia, EF=58%; pt wasn't taking Plavix, therefore stopped in favor of ASA'81mg'$ /Andrew... ~  3/14:  He had f/u [name]> CAD, IWMI 1995, mult PCIs, last Myoview 2013 was low risk; too sedentary, no changes made; 21 y/o brother died recently w/ AAA rupture... ~  EKG 3/14 showed NSR, rate65, WNL, NAD... ~  He maintains f/u w/ CARDS, [name]- known CAD, Hx inferior MI 1995, s/p mult PCIs- most recent 2009; finally qit smoking 12/16 after [facility] for AECOPD w/ hypoxemia; on ASA, statin, Amlod & Lisin.  HYPERCHOLESTEROLEMIA (Mixed Hyperlipidemia) - prev on Simva80 but DrBrodie decr him to SIMVASTATIN '20mg'$ /Andrew + Fish Oil & Red Wine qd... ~  [place] 9/08 on Simva40 showed TChol 151, TG 106, HDL 23, LDL 107 ~  FLP 8/09 on Simva80 showed TChol 142, TG 135, HDL 28, LDL 87 ~  FLP 6/10 on Simva80 showed TChol 151, TG 117, HDL 31, LDL 97 ~  FLP in hosp 10/10 showed TChol 128, TG 110, HDL 26, LDL 80 ~  FLP 5/11 on Simva80 showed TChol 144, TG 215, HDL 26, LDL 89 ~  9/11:  DrBrodie decr him to Simva20... we discussed the need for better diet & wt reduction. ~  FLP 5/12 on Simva20 showed TChol 162, TG 159, HDL 32, LDL 98 ~  FLP 6/13 on Simva20 showed TChol 130, TG 102, HDL 33, LDL 77... rec to incr exercise program & get wt down! ~  FLP 7/14 on Simva20 showed TChol 158, TG 145, HDL 33, LDL 96... He has gained way too much wt 7 must get on diet! ~  Followed by PCP-  [name]...  DIABETES MELLITUS, BORDERLINE (ICD-790.29) - on diet alone... ~  labs 9/08 showed BS=100, HgA1c=6.1 ~  labs 8/09 showed BS= 107, A1c= 6.2 ~  labs 6/10 showed BS= 108, A1c= 6.2 ~  lab 5/11 (wt=244#) showed BS= 121. A1c= 6.7.[name]. he was told "get wt down, or start meds". ~  Labs 5/12 showed BS= 109, A1c= 6.1 ~  Labs 6/13 showed BS= 114, & he needs to lose the weight... ~  Labs 7/14 on diet alone showed BS= 120, A1c= 6.7 ~  Followed by PCP- [name]...  OBESITY (ICD-278.00) - weight was down as low as 202# in 2/09, and steadily incr to 244# by 5/11... ~  weight 5/11 = 244#.[name].  we reviewed diet + exercise necessary to lose weight... ~  weight 11/11 = 217# (after dx & rx for bladder cancer) ~  Weight 5/12 = 214# but says it's 192# at home... ~  Weight 6/13 = 211# but knows it's better since his pant/ belt is down to 36" (under his roll). ~  Weight 7/14 = 230# but he has quit smoking he says, now must lose the weight... ~  Weight 1/15 = 229# ~  Weight 1/17 = 216# ~  Weight 3/17 = 231# and we reviewed diet, exercise, wt reducing strategies...  GASTRITIS (ICD-535.50) - had EGD 10/05 showing gastritis... on RANITADINE '300mg'$ /Andrew due to his Plavix Rx, off prev Omep.  COLONIC POLYPS (ICD-211.3) - last colonoscopy by [name] 3/04 showed 39m polyp = hyperplastic w/ f/u planned 181yr. 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 ~  4/12:  Pt reported f/u cysto was neg, doing well... ~  10/12:  Note from [name] reviewed> cysto was neg, no recurrent tumors... ~  4/13:  Note from [name] reviewed> neg f/u cysto w/o recurrent TCCa... He wants to continue Q61mocystoscopies... ~  1/14:  He had f/u w/ [name]> Hx TCCa bladder, s/p TURBT  9/11, given BCG 11/11-12/11 and f/u Cysto 1/12 was neg; repeat cysto 1/14 was neg as well...  DEGENERATIVE JOINT DISEASE (ICD-715.90) - He has c/o right shoulder pain, hip pain, knee pain, and LBP> Rx ADVIL w/ some benefit... offered Ortho referral & he will decide.  ANXIETY (ICD-300.00) - on ALPRAZOLAM 0.'5mg'$  Prn...   Past Surgical History:  Procedure Laterality Date  . cataract surgery  septemeber 2013  . cataract surgery/sub posterior vitrectomy in left eye  1996   Dr. RZadie Rhine . [facility]  [phone]   Outpatient Encounter Prescriptions as of 06/07/2017  Medication Sig  . albuterol (PROVENTIL HFA;VENTOLIN HFA) 108 (90 BASE) MCG/ACT inhaler Inhale 2 puffs into the lungs 4 (four) times daily as needed for wheezing (for use when not at home / unable to use nebulizer).  . ALPRAZolam (XANAX) 0.5 MG tablet Take 0.5 mg by mouth at bedtime.   .Marland Kitchenaspirin 81 MG tablet Take 81 mg by mouth at bedtime.   . budesonide (PULMICORT) 0.25 MG/2ML nebulizer solution Take 2 mLs (0.25 mg total) by nebulization 2 (two) times daily.  . Flaxseed, Linseed, (FLAXSEED OIL) 1000 MG CAPS Take 1 capsule by mouth daily.    . furosemide (LASIX) 40 MG tablet Take 40 mg by mouth 2 (two) times daily.  .Marland Kitchenibuprofen (ADVIL,MOTRIN) 600 MG tablet Take 600 mg by mouth every 6 (six) hours as needed for moderate pain.  .Marland Kitchenipratropium-albuterol (DUONEB) 0.5-2.5 (3) MG/3ML SOLN Take 3 mLs by nebulization 4 (four) times daily.  . irbesartan (AVAPRO) 75 MG tablet Take 1 tablet (75 mg total) by mouth daily.  . isosorbide mononitrate (IMDUR) 30 MG 24 hr tablet Take 1 tablet (30 mg total) by mouth daily.  . Multiple Vitamin (MULTIVITAMIN) capsule Take 1 capsule by mouth daily.    . nitroGLYCERIN (NITROSTAT) 0.4 MG SL tablet Place 1 tablet (0.4 mg total) under the tongue every 5 (five) minutes as needed for chest pain.  . Omega-3 Fatty Acids (FISH OIL) 1200 MG CAPS Take 1 capsule by mouth daily.    . potassium chloride SA (K-DUR,KLOR-CON)  20 MEQ tablet Take 1 tablet (20 mEq total) by mouth daily.  . predniSONE (DELTASONE) 20 MG tablet Take 2 tablets a day for 4 days, then 1 tablet a day for 4 days, then half a tablet for 4 days, then stop  . simvastatin (ZOCOR) 20 MG tablet Take 1 tablet (20 mg total) by mouth daily at 6 PM.  . sodium chloride (OCEAN) 0.65 % SOLN nasal spray Place 1 spray into both nostrils as needed for congestion.  .Marland Kitchenalbuterol (PROVENTIL) (2.5 MG/3ML) 0.083% nebulizer solution Take 3 mLs (2.5 mg total) by nebulization every 2 (two) hours as needed for wheezing or shortness of breath. (Patient not taking: Reported on 01/27/2017)    Allergies  Allergen Reactions  . Amlodipine Swelling    Current Medications, Allergies, Past [name]  History, Past Surgical History, Family History, and Social History were reviewed in [employer] record.    Review of Systems        See HPI - all other systems neg except as noted... The patient complains of some dyspnea on exertion.  The patient denies anorexia, fever, weight loss, weight gain, vision loss, decreased hearing, hoarseness, chest pain, syncope, peripheral edema, prolonged cough, headaches, hemoptysis, abdominal pain, melena, hematochezia, severe indigestion/heartburn, hematuria, incontinence, muscle weakness, suspicious skin lesions, transient blindness, difficulty walking, depression, unusual weight change, abnormal bleeding, enlarged lymph nodes, and angioedema.     Objective:   Physical Exam    WD, Obese, 71 y/o WM in NAD... Vital Signs:  Reviewed... GENERAL:  Alert & oriented; pleasant & cooperative... HEENT:  /AT, EOM-wnl, PERRLA, EACs-clear, TMs-wnl, NOSE- sl congestion, THROAT-clear & wnl. NECK:  Supple w/ fairROM; no JVD; normal carotid impulses w/o bruits; no thyromegaly or nodules palpated; no lymphadenopathy. CHEST:  decr BS at bases bilat w/ few scat rhonchi, no wheezing/ rales/ or signs of consolidation... HEART:  Regular  Rhythm; without murmurs/ rubs/ or gallops detected... ABDOMEN:  Obese, soft, & nontender; sm knot palpated in mid abd above umbilicus; normal bowel sounds; no organomegaly detected. EXT: without deformities, mild arthritic changes; no varicose veins/ +venous insuffic/ no edema. NEURO:  CN's intact; no focal neuro deficits... DERM:  No lesions noted; no rash etc...  RADIOLOGY DATA:  Reviewed in the EPIC EMR & discussed w/ the patient...  LABORATORY DATA:  Reviewed in the EPIC EMR & discussed w/ the patient...   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  11/21/15>  referred for PulmRehab, contact [facility] re- POC, he still needs Full PFTs & CT Chest... We plan ROV in 6 wks 02/02/16> We discussed continuing the NEBS Tid followed by Advair250Bid & Spiriva daily; add Mucinex '1200mg'$ Bid & we will Rx w/ Levaquin500/Andrew x7d for his bronchitic infection (see AVS);  He still needs a CT Chest but wants to wait for now;  Needs to get into the [name] program ASAP, and he wants diff DME supplier- we will inquire, plan ROV 56mo6/21/17>   [place] we switched to NEBS w/ Duoneb Qid & Budes Bid; added ProairHFA prn; he desperately needs to lose wt in conjunction w/ his PulmRehab etc... 08/05/16>   He presents w/ a COPD exac-- Rx w/ Levaquin, Pred20-4d taper + NEBS w/ duoneb Qid & budes Bid... 11/24/16>   EJaquelyn Bitterhas severe COPD/emphysema w/ pulmHTN & CorPulmonale; he refuses to fill inhalers due to costs & he is therefore on O2- 24/7, BiPAP nightly, NEBS w/ Duoneb Qid & Budes Bid, Mucinex 1200Bid, & asked to get into a  regular exercise program;  He will continue regular f/u w/ CARDS- [name]; must work on weight reduction... 01/27/17>   [name] severe end-stage COPD- mixed chr bronchitis & emphysema w/ chr hypoxemia & hypercarbic resp failure, w/ Hx pulmHTN & right heart failure; s/p Hosp & now improved and approaching his baseline on max meds=> O2 at 2-4L/min 24/7 (he has [employer]  that supplies 2L/min continuous or up to 6L/min pulse dose flow), nocturnal BiPAP, NEBs w/ Duoneb Qid & Budes 0.25Bid, Mucinex '1200mg'$ Bid, weaning oral Prednisone from his recent hosp, finished oral Levaquin; on ASA81, Lasix40Bid, Avapro75, Imdur30, K20, Zocor20, and off prev Metoprolol... We discussed continuing the Pred taper til gone, continue other meds the same for now & ROV recheck 43mo.. Note: he has been followed for Cards by [name] (HBP, CAD w/ IWMI 1995 & mult PCIs, HL) and last seen in 201`5 but he saw SRichardson Doppx2 in 5-04/2016... 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 GU>   Followed by [name]   Patient's Medications  New Prescriptions   GUAIFENESIN '1200mg'$  Tabs    1 tab by mouth twice daily w/ fluids   LEVOFLOXACIN (LEVAQUIN) 500 MG TABLET    1 tablet by mouth once daily until gone (for infection)  Previous Medications   ALBUTEROL (PROVENTIL HFA;VENTOLIN HFA) 108 (90 BASE) MCG/ACT INHALER    Inhale 2 puffs into the lungs 4 (four) times daily as needed for wheezing (for use when  not at home / unable to use nebulizer).   ALBUTEROL (PROVENTIL) (2.5 MG/3ML) 0.083% NEBULIZER SOLUTION    Take 3 mLs (2.5 mg total) by nebulization every 2 (two) hours as needed for wheezing or shortness of breath.   ALPRAZOLAM (XANAX) 0.5 MG TABLET    Take 0.5 mg by mouth at bedtime.    ASPIRIN 81 MG TABLET    Take 81 mg by mouth at bedtime.    FLAXSEED, LINSEED, (FLAXSEED OIL) 1000 MG CAPS    Take 1 capsule by mouth daily.     FUROSEMIDE (LASIX) 40 MG TABLET    Take 40 mg by mouth 2 (two) times daily.   IBUPROFEN (ADVIL,MOTRIN) 600 MG TABLET    Take 600 mg by mouth every 6 (six) hours as needed for moderate pain.   IRBESARTAN (AVAPRO) 75 MG TABLET    Take 1 tablet (75 mg total) by mouth daily.   ISOSORBIDE MONONITRATE (IMDUR) 30 MG 24 HR TABLET    Take 1 tablet (30 mg total) by mouth daily.   MULTIPLE VITAMIN (MULTIVITAMIN) CAPSULE    Take 1 capsule by mouth daily.     NITROGLYCERIN (NITROSTAT) 0.4 MG SL TABLET    Place 1 tablet (0.4 mg total) under the tongue every 5 (five) minutes as needed for chest pain.   OMEGA-3 FATTY ACIDS (FISH OIL) 1200 MG CAPS    Take 1 capsule by mouth daily.     POTASSIUM CHLORIDE SA (K-DUR,KLOR-CON) 20 MEQ TABLET    Take 1 tablet (20 mEq total) by mouth daily.   PREDNISONE (DELTASONE) 20 MG TABLET    Take 2 tablets a day for 4 days, then 1 tablet a day for 4 days, then half a tablet for 4 days,  then stop   SIMVASTATIN (ZOCOR) 20 MG TABLET    Take 1 tablet (20 mg total) by mouth daily at 6 PM.   SODIUM CHLORIDE (OCEAN) 0.65 % SOLN NASAL SPRAY    Place 1 spray into both nostrils as needed for congestion.  Modified Medications   Modified Medication Previous Medication   BUDESONIDE (PULMICORT) 0.25 MG/2ML NEBULIZER SOLUTION budesonide (PULMICORT) 0.25 MG/2ML nebulizer solution      Take 2 mLs (0.25 mg total) by nebulization 2 (two) times daily.    Take 2 mLs (0.25 mg total) by nebulization 2 (two) times daily.   IPRATROPIUM-ALBUTEROL (DUONEB) 0.5-2.5 (3) MG/3ML SOLN  ipratropium-albuterol (DUONEB) 0.5-2.5 (3) MG/3ML SOLN      Take 3 mLs by nebulization 4 (four) times daily.    Take 3 mLs by nebulization 4 (four) times daily.  Discontinued Medications   No medications on file  NOTE>> Advair & Spiriva discontinued due to cost & replaced by DUONEB-Qid & PULMICORT 0,'25mg'$ Bid via NEBULIZER.Marland KitchenMarland Kitchen

## 2017-06-10 DIAGNOSIS — J438 Other emphysema: Secondary | ICD-10-CM | POA: Diagnosis not present

## 2017-06-10 DIAGNOSIS — J9611 Chronic respiratory failure with hypoxia: Secondary | ICD-10-CM | POA: Diagnosis not present

## 2017-06-10 DIAGNOSIS — I2510 Atherosclerotic heart disease of native coronary artery without angina pectoris: Secondary | ICD-10-CM | POA: Diagnosis not present

## 2017-06-10 DIAGNOSIS — J9612 Chronic respiratory failure with hypercapnia: Secondary | ICD-10-CM | POA: Diagnosis not present

## 2017-06-10 DIAGNOSIS — C494 Malignant neoplasm of connective and soft tissue of abdomen: Secondary | ICD-10-CM | POA: Diagnosis not present

## 2017-06-10 DIAGNOSIS — I119 Hypertensive heart disease without heart failure: Secondary | ICD-10-CM | POA: Diagnosis not present

## 2017-06-13 DIAGNOSIS — I119 Hypertensive heart disease without heart failure: Secondary | ICD-10-CM | POA: Diagnosis not present

## 2017-06-13 DIAGNOSIS — I2510 Atherosclerotic heart disease of native coronary artery without angina pectoris: Secondary | ICD-10-CM | POA: Diagnosis not present

## 2017-06-13 DIAGNOSIS — C494 Malignant neoplasm of connective and soft tissue of abdomen: Secondary | ICD-10-CM | POA: Diagnosis not present

## 2017-06-13 DIAGNOSIS — J438 Other emphysema: Secondary | ICD-10-CM | POA: Diagnosis not present

## 2017-06-13 DIAGNOSIS — J9611 Chronic respiratory failure with hypoxia: Secondary | ICD-10-CM | POA: Diagnosis not present

## 2017-06-13 DIAGNOSIS — J9612 Chronic respiratory failure with hypercapnia: Secondary | ICD-10-CM | POA: Diagnosis not present

## 2017-06-16 DIAGNOSIS — J438 Other emphysema: Secondary | ICD-10-CM | POA: Diagnosis not present

## 2017-06-16 DIAGNOSIS — C494 Malignant neoplasm of connective and soft tissue of abdomen: Secondary | ICD-10-CM | POA: Diagnosis not present

## 2017-06-16 DIAGNOSIS — I119 Hypertensive heart disease without heart failure: Secondary | ICD-10-CM | POA: Diagnosis not present

## 2017-06-16 DIAGNOSIS — I2510 Atherosclerotic heart disease of native coronary artery without angina pectoris: Secondary | ICD-10-CM | POA: Diagnosis not present

## 2017-06-16 DIAGNOSIS — J9611 Chronic respiratory failure with hypoxia: Secondary | ICD-10-CM | POA: Diagnosis not present

## 2017-06-16 DIAGNOSIS — J9612 Chronic respiratory failure with hypercapnia: Secondary | ICD-10-CM | POA: Diagnosis not present

## 2017-06-20 DIAGNOSIS — I2510 Atherosclerotic heart disease of native coronary artery without angina pectoris: Secondary | ICD-10-CM | POA: Diagnosis not present

## 2017-06-20 DIAGNOSIS — J438 Other emphysema: Secondary | ICD-10-CM | POA: Diagnosis not present

## 2017-06-20 DIAGNOSIS — I119 Hypertensive heart disease without heart failure: Secondary | ICD-10-CM | POA: Diagnosis not present

## 2017-06-20 DIAGNOSIS — J9612 Chronic respiratory failure with hypercapnia: Secondary | ICD-10-CM | POA: Diagnosis not present

## 2017-06-20 DIAGNOSIS — C494 Malignant neoplasm of connective and soft tissue of abdomen: Secondary | ICD-10-CM | POA: Diagnosis not present

## 2017-06-20 DIAGNOSIS — J9611 Chronic respiratory failure with hypoxia: Secondary | ICD-10-CM | POA: Diagnosis not present

## 2017-06-22 DIAGNOSIS — Z6837 Body mass index (BMI) 37.0-37.9, adult: Secondary | ICD-10-CM | POA: Diagnosis not present

## 2017-06-22 DIAGNOSIS — J9612 Chronic respiratory failure with hypercapnia: Secondary | ICD-10-CM | POA: Diagnosis not present

## 2017-06-22 DIAGNOSIS — J449 Chronic obstructive pulmonary disease, unspecified: Secondary | ICD-10-CM | POA: Diagnosis not present

## 2017-06-22 DIAGNOSIS — Z510 Encounter for antineoplastic radiation therapy: Secondary | ICD-10-CM | POA: Diagnosis not present

## 2017-06-22 DIAGNOSIS — C494 Malignant neoplasm of connective and soft tissue of abdomen: Secondary | ICD-10-CM | POA: Diagnosis not present

## 2017-06-22 DIAGNOSIS — J9611 Chronic respiratory failure with hypoxia: Secondary | ICD-10-CM | POA: Diagnosis not present

## 2017-06-22 DIAGNOSIS — J438 Other emphysema: Secondary | ICD-10-CM | POA: Diagnosis not present

## 2017-06-22 DIAGNOSIS — E876 Hypokalemia: Secondary | ICD-10-CM | POA: Diagnosis not present

## 2017-06-22 DIAGNOSIS — C499 Malignant neoplasm of connective and soft tissue, unspecified: Secondary | ICD-10-CM | POA: Diagnosis not present

## 2017-06-22 DIAGNOSIS — I2510 Atherosclerotic heart disease of native coronary artery without angina pectoris: Secondary | ICD-10-CM | POA: Diagnosis not present

## 2017-06-22 DIAGNOSIS — I119 Hypertensive heart disease without heart failure: Secondary | ICD-10-CM | POA: Diagnosis not present

## 2017-06-23 DIAGNOSIS — D649 Anemia, unspecified: Secondary | ICD-10-CM | POA: Diagnosis not present

## 2017-06-24 DIAGNOSIS — R1903 Right lower quadrant abdominal swelling, mass and lump: Secondary | ICD-10-CM | POA: Diagnosis not present

## 2017-06-24 DIAGNOSIS — K760 Fatty (change of) liver, not elsewhere classified: Secondary | ICD-10-CM | POA: Diagnosis not present

## 2017-06-24 DIAGNOSIS — C494 Malignant neoplasm of connective and soft tissue of abdomen: Secondary | ICD-10-CM | POA: Diagnosis not present

## 2017-06-24 DIAGNOSIS — R599 Enlarged lymph nodes, unspecified: Secondary | ICD-10-CM | POA: Diagnosis not present

## 2017-06-24 DIAGNOSIS — R918 Other nonspecific abnormal finding of lung field: Secondary | ICD-10-CM | POA: Diagnosis not present

## 2017-06-24 DIAGNOSIS — R161 Splenomegaly, not elsewhere classified: Secondary | ICD-10-CM | POA: Diagnosis not present

## 2017-06-28 DIAGNOSIS — J9611 Chronic respiratory failure with hypoxia: Secondary | ICD-10-CM | POA: Diagnosis not present

## 2017-06-28 DIAGNOSIS — I2510 Atherosclerotic heart disease of native coronary artery without angina pectoris: Secondary | ICD-10-CM | POA: Diagnosis not present

## 2017-06-28 DIAGNOSIS — I119 Hypertensive heart disease without heart failure: Secondary | ICD-10-CM | POA: Diagnosis not present

## 2017-06-28 DIAGNOSIS — J438 Other emphysema: Secondary | ICD-10-CM | POA: Diagnosis not present

## 2017-06-28 DIAGNOSIS — C494 Malignant neoplasm of connective and soft tissue of abdomen: Secondary | ICD-10-CM | POA: Diagnosis not present

## 2017-06-28 DIAGNOSIS — J9612 Chronic respiratory failure with hypercapnia: Secondary | ICD-10-CM | POA: Diagnosis not present

## 2017-06-30 DIAGNOSIS — J9612 Chronic respiratory failure with hypercapnia: Secondary | ICD-10-CM | POA: Diagnosis not present

## 2017-06-30 DIAGNOSIS — I119 Hypertensive heart disease without heart failure: Secondary | ICD-10-CM | POA: Diagnosis not present

## 2017-06-30 DIAGNOSIS — C494 Malignant neoplasm of connective and soft tissue of abdomen: Secondary | ICD-10-CM | POA: Diagnosis not present

## 2017-06-30 DIAGNOSIS — J9611 Chronic respiratory failure with hypoxia: Secondary | ICD-10-CM | POA: Diagnosis not present

## 2017-06-30 DIAGNOSIS — I2510 Atherosclerotic heart disease of native coronary artery without angina pectoris: Secondary | ICD-10-CM | POA: Diagnosis not present

## 2017-06-30 DIAGNOSIS — J438 Other emphysema: Secondary | ICD-10-CM | POA: Diagnosis not present

## 2017-07-01 DIAGNOSIS — J9612 Chronic respiratory failure with hypercapnia: Secondary | ICD-10-CM | POA: Diagnosis not present

## 2017-07-01 DIAGNOSIS — C494 Malignant neoplasm of connective and soft tissue of abdomen: Secondary | ICD-10-CM | POA: Diagnosis not present

## 2017-07-01 DIAGNOSIS — J9611 Chronic respiratory failure with hypoxia: Secondary | ICD-10-CM | POA: Diagnosis not present

## 2017-07-01 DIAGNOSIS — I119 Hypertensive heart disease without heart failure: Secondary | ICD-10-CM | POA: Diagnosis not present

## 2017-07-01 DIAGNOSIS — J438 Other emphysema: Secondary | ICD-10-CM | POA: Diagnosis not present

## 2017-07-01 DIAGNOSIS — I2510 Atherosclerotic heart disease of native coronary artery without angina pectoris: Secondary | ICD-10-CM | POA: Diagnosis not present

## 2017-07-08 DIAGNOSIS — J438 Other emphysema: Secondary | ICD-10-CM | POA: Diagnosis not present

## 2017-07-08 DIAGNOSIS — J9611 Chronic respiratory failure with hypoxia: Secondary | ICD-10-CM | POA: Diagnosis not present

## 2017-07-08 DIAGNOSIS — I2510 Atherosclerotic heart disease of native coronary artery without angina pectoris: Secondary | ICD-10-CM | POA: Diagnosis not present

## 2017-07-08 DIAGNOSIS — J9612 Chronic respiratory failure with hypercapnia: Secondary | ICD-10-CM | POA: Diagnosis not present

## 2017-07-08 DIAGNOSIS — C494 Malignant neoplasm of connective and soft tissue of abdomen: Secondary | ICD-10-CM | POA: Diagnosis not present

## 2017-07-08 DIAGNOSIS — I119 Hypertensive heart disease without heart failure: Secondary | ICD-10-CM | POA: Diagnosis not present

## 2017-07-12 DIAGNOSIS — I119 Hypertensive heart disease without heart failure: Secondary | ICD-10-CM | POA: Diagnosis not present

## 2017-07-12 DIAGNOSIS — J9611 Chronic respiratory failure with hypoxia: Secondary | ICD-10-CM | POA: Diagnosis not present

## 2017-07-12 DIAGNOSIS — I2510 Atherosclerotic heart disease of native coronary artery without angina pectoris: Secondary | ICD-10-CM | POA: Diagnosis not present

## 2017-07-12 DIAGNOSIS — C494 Malignant neoplasm of connective and soft tissue of abdomen: Secondary | ICD-10-CM | POA: Diagnosis not present

## 2017-07-12 DIAGNOSIS — J438 Other emphysema: Secondary | ICD-10-CM | POA: Diagnosis not present

## 2017-07-12 DIAGNOSIS — J9612 Chronic respiratory failure with hypercapnia: Secondary | ICD-10-CM | POA: Diagnosis not present

## 2017-07-15 DIAGNOSIS — R7309 Other abnormal glucose: Secondary | ICD-10-CM | POA: Diagnosis not present

## 2017-07-15 DIAGNOSIS — E784 Other hyperlipidemia: Secondary | ICD-10-CM | POA: Diagnosis not present

## 2017-07-15 DIAGNOSIS — I10 Essential (primary) hypertension: Secondary | ICD-10-CM | POA: Diagnosis not present

## 2017-07-15 DIAGNOSIS — Z125 Encounter for screening for malignant neoplasm of prostate: Secondary | ICD-10-CM | POA: Diagnosis not present

## 2017-07-16 DIAGNOSIS — C494 Malignant neoplasm of connective and soft tissue of abdomen: Secondary | ICD-10-CM | POA: Diagnosis not present

## 2017-07-16 DIAGNOSIS — J438 Other emphysema: Secondary | ICD-10-CM | POA: Diagnosis not present

## 2017-07-16 DIAGNOSIS — I2510 Atherosclerotic heart disease of native coronary artery without angina pectoris: Secondary | ICD-10-CM | POA: Diagnosis not present

## 2017-07-16 DIAGNOSIS — J9612 Chronic respiratory failure with hypercapnia: Secondary | ICD-10-CM | POA: Diagnosis not present

## 2017-07-16 DIAGNOSIS — I119 Hypertensive heart disease without heart failure: Secondary | ICD-10-CM | POA: Diagnosis not present

## 2017-07-16 DIAGNOSIS — J9611 Chronic respiratory failure with hypoxia: Secondary | ICD-10-CM | POA: Diagnosis not present

## 2017-07-18 DIAGNOSIS — I119 Hypertensive heart disease without heart failure: Secondary | ICD-10-CM | POA: Diagnosis not present

## 2017-07-18 DIAGNOSIS — I2510 Atherosclerotic heart disease of native coronary artery without angina pectoris: Secondary | ICD-10-CM | POA: Diagnosis not present

## 2017-07-18 DIAGNOSIS — C494 Malignant neoplasm of connective and soft tissue of abdomen: Secondary | ICD-10-CM | POA: Diagnosis not present

## 2017-07-18 DIAGNOSIS — J9612 Chronic respiratory failure with hypercapnia: Secondary | ICD-10-CM | POA: Diagnosis not present

## 2017-07-18 DIAGNOSIS — J438 Other emphysema: Secondary | ICD-10-CM | POA: Diagnosis not present

## 2017-07-18 DIAGNOSIS — J9611 Chronic respiratory failure with hypoxia: Secondary | ICD-10-CM | POA: Diagnosis not present

## 2017-07-19 DIAGNOSIS — C494 Malignant neoplasm of connective and soft tissue of abdomen: Secondary | ICD-10-CM | POA: Diagnosis not present

## 2017-07-21 DIAGNOSIS — J9612 Chronic respiratory failure with hypercapnia: Secondary | ICD-10-CM | POA: Diagnosis not present

## 2017-07-21 DIAGNOSIS — J9611 Chronic respiratory failure with hypoxia: Secondary | ICD-10-CM | POA: Diagnosis not present

## 2017-07-21 DIAGNOSIS — I119 Hypertensive heart disease without heart failure: Secondary | ICD-10-CM | POA: Diagnosis not present

## 2017-07-21 DIAGNOSIS — I2510 Atherosclerotic heart disease of native coronary artery without angina pectoris: Secondary | ICD-10-CM | POA: Diagnosis not present

## 2017-07-21 DIAGNOSIS — C494 Malignant neoplasm of connective and soft tissue of abdomen: Secondary | ICD-10-CM | POA: Diagnosis not present

## 2017-07-21 DIAGNOSIS — J438 Other emphysema: Secondary | ICD-10-CM | POA: Diagnosis not present

## 2017-07-22 DIAGNOSIS — Z1389 Encounter for screening for other disorder: Secondary | ICD-10-CM | POA: Diagnosis not present

## 2017-07-22 DIAGNOSIS — C499 Malignant neoplasm of connective and soft tissue, unspecified: Secondary | ICD-10-CM | POA: Diagnosis not present

## 2017-07-22 DIAGNOSIS — E784 Other hyperlipidemia: Secondary | ICD-10-CM | POA: Diagnosis not present

## 2017-07-22 DIAGNOSIS — Z0000 Encounter for general adult medical examination without abnormal findings: Secondary | ICD-10-CM | POA: Diagnosis not present

## 2017-07-22 DIAGNOSIS — Z510 Encounter for antineoplastic radiation therapy: Secondary | ICD-10-CM | POA: Diagnosis not present

## 2017-07-22 DIAGNOSIS — Z6837 Body mass index (BMI) 37.0-37.9, adult: Secondary | ICD-10-CM | POA: Diagnosis not present

## 2017-07-22 DIAGNOSIS — J449 Chronic obstructive pulmonary disease, unspecified: Secondary | ICD-10-CM | POA: Diagnosis not present

## 2017-07-22 DIAGNOSIS — Z23 Encounter for immunization: Secondary | ICD-10-CM | POA: Diagnosis not present

## 2017-07-22 DIAGNOSIS — I2789 Other specified pulmonary heart diseases: Secondary | ICD-10-CM | POA: Diagnosis not present

## 2017-07-22 DIAGNOSIS — I25118 Atherosclerotic heart disease of native coronary artery with other forms of angina pectoris: Secondary | ICD-10-CM | POA: Diagnosis not present

## 2017-07-22 DIAGNOSIS — I872 Venous insufficiency (chronic) (peripheral): Secondary | ICD-10-CM | POA: Diagnosis not present

## 2017-07-22 DIAGNOSIS — Z9981 Dependence on supplemental oxygen: Secondary | ICD-10-CM | POA: Diagnosis not present

## 2017-07-22 DIAGNOSIS — R918 Other nonspecific abnormal finding of lung field: Secondary | ICD-10-CM | POA: Diagnosis not present

## 2017-07-26 DIAGNOSIS — J9612 Chronic respiratory failure with hypercapnia: Secondary | ICD-10-CM | POA: Diagnosis not present

## 2017-07-26 DIAGNOSIS — J9611 Chronic respiratory failure with hypoxia: Secondary | ICD-10-CM | POA: Diagnosis not present

## 2017-07-26 DIAGNOSIS — C494 Malignant neoplasm of connective and soft tissue of abdomen: Secondary | ICD-10-CM | POA: Diagnosis not present

## 2017-07-26 DIAGNOSIS — J438 Other emphysema: Secondary | ICD-10-CM | POA: Diagnosis not present

## 2017-07-26 DIAGNOSIS — I119 Hypertensive heart disease without heart failure: Secondary | ICD-10-CM | POA: Diagnosis not present

## 2017-07-26 DIAGNOSIS — I2510 Atherosclerotic heart disease of native coronary artery without angina pectoris: Secondary | ICD-10-CM | POA: Diagnosis not present

## 2017-07-28 DIAGNOSIS — I2510 Atherosclerotic heart disease of native coronary artery without angina pectoris: Secondary | ICD-10-CM | POA: Diagnosis not present

## 2017-07-28 DIAGNOSIS — J438 Other emphysema: Secondary | ICD-10-CM | POA: Diagnosis not present

## 2017-07-28 DIAGNOSIS — I119 Hypertensive heart disease without heart failure: Secondary | ICD-10-CM | POA: Diagnosis not present

## 2017-07-28 DIAGNOSIS — C494 Malignant neoplasm of connective and soft tissue of abdomen: Secondary | ICD-10-CM | POA: Diagnosis not present

## 2017-07-28 DIAGNOSIS — J9611 Chronic respiratory failure with hypoxia: Secondary | ICD-10-CM | POA: Diagnosis not present

## 2017-07-28 DIAGNOSIS — J9612 Chronic respiratory failure with hypercapnia: Secondary | ICD-10-CM | POA: Diagnosis not present

## 2017-07-29 DIAGNOSIS — Z1212 Encounter for screening for malignant neoplasm of rectum: Secondary | ICD-10-CM | POA: Diagnosis not present

## 2017-08-01 DIAGNOSIS — J9612 Chronic respiratory failure with hypercapnia: Secondary | ICD-10-CM | POA: Diagnosis not present

## 2017-08-01 DIAGNOSIS — C494 Malignant neoplasm of connective and soft tissue of abdomen: Secondary | ICD-10-CM | POA: Diagnosis not present

## 2017-08-01 DIAGNOSIS — I2510 Atherosclerotic heart disease of native coronary artery without angina pectoris: Secondary | ICD-10-CM | POA: Diagnosis not present

## 2017-08-01 DIAGNOSIS — J9611 Chronic respiratory failure with hypoxia: Secondary | ICD-10-CM | POA: Diagnosis not present

## 2017-08-01 DIAGNOSIS — I119 Hypertensive heart disease without heart failure: Secondary | ICD-10-CM | POA: Diagnosis not present

## 2017-08-01 DIAGNOSIS — J438 Other emphysema: Secondary | ICD-10-CM | POA: Diagnosis not present

## 2017-08-03 DIAGNOSIS — I2510 Atherosclerotic heart disease of native coronary artery without angina pectoris: Secondary | ICD-10-CM | POA: Diagnosis not present

## 2017-08-03 DIAGNOSIS — I119 Hypertensive heart disease without heart failure: Secondary | ICD-10-CM | POA: Diagnosis not present

## 2017-08-03 DIAGNOSIS — J9611 Chronic respiratory failure with hypoxia: Secondary | ICD-10-CM | POA: Diagnosis not present

## 2017-08-03 DIAGNOSIS — J438 Other emphysema: Secondary | ICD-10-CM | POA: Diagnosis not present

## 2017-08-03 DIAGNOSIS — C494 Malignant neoplasm of connective and soft tissue of abdomen: Secondary | ICD-10-CM | POA: Diagnosis not present

## 2017-08-03 DIAGNOSIS — J9612 Chronic respiratory failure with hypercapnia: Secondary | ICD-10-CM | POA: Diagnosis not present

## 2017-08-09 DIAGNOSIS — Z8042 Family history of malignant neoplasm of prostate: Secondary | ICD-10-CM | POA: Diagnosis not present

## 2017-08-09 DIAGNOSIS — C499 Malignant neoplasm of connective and soft tissue, unspecified: Secondary | ICD-10-CM | POA: Diagnosis not present

## 2017-08-09 DIAGNOSIS — Z1159 Encounter for screening for other viral diseases: Secondary | ICD-10-CM | POA: Diagnosis not present

## 2017-08-09 DIAGNOSIS — Z800 Family history of malignant neoplasm of digestive organs: Secondary | ICD-10-CM | POA: Diagnosis not present

## 2017-08-09 DIAGNOSIS — E785 Hyperlipidemia, unspecified: Secondary | ICD-10-CM | POA: Diagnosis not present

## 2017-08-09 DIAGNOSIS — C494 Malignant neoplasm of connective and soft tissue of abdomen: Secondary | ICD-10-CM | POA: Diagnosis not present

## 2017-08-09 DIAGNOSIS — J449 Chronic obstructive pulmonary disease, unspecified: Secondary | ICD-10-CM | POA: Diagnosis not present

## 2017-08-09 DIAGNOSIS — Z510 Encounter for antineoplastic radiation therapy: Secondary | ICD-10-CM | POA: Diagnosis not present

## 2017-08-09 DIAGNOSIS — Z6837 Body mass index (BMI) 37.0-37.9, adult: Secondary | ICD-10-CM | POA: Diagnosis not present

## 2017-08-09 DIAGNOSIS — Z8551 Personal history of malignant neoplasm of bladder: Secondary | ICD-10-CM | POA: Diagnosis not present

## 2017-08-09 DIAGNOSIS — R1903 Right lower quadrant abdominal swelling, mass and lump: Secondary | ICD-10-CM | POA: Diagnosis not present

## 2017-08-09 DIAGNOSIS — I10 Essential (primary) hypertension: Secondary | ICD-10-CM | POA: Diagnosis not present

## 2017-08-09 DIAGNOSIS — Z801 Family history of malignant neoplasm of trachea, bronchus and lung: Secondary | ICD-10-CM | POA: Diagnosis not present

## 2017-08-09 DIAGNOSIS — Z9981 Dependence on supplemental oxygen: Secondary | ICD-10-CM | POA: Diagnosis not present

## 2017-08-09 DIAGNOSIS — I872 Venous insufficiency (chronic) (peripheral): Secondary | ICD-10-CM | POA: Diagnosis not present

## 2017-08-09 DIAGNOSIS — R918 Other nonspecific abnormal finding of lung field: Secondary | ICD-10-CM | POA: Diagnosis not present

## 2017-08-09 DIAGNOSIS — I272 Pulmonary hypertension, unspecified: Secondary | ICD-10-CM | POA: Diagnosis not present

## 2017-08-09 DIAGNOSIS — Z803 Family history of malignant neoplasm of breast: Secondary | ICD-10-CM | POA: Diagnosis not present

## 2017-08-09 DIAGNOSIS — I25118 Atherosclerotic heart disease of native coronary artery with other forms of angina pectoris: Secondary | ICD-10-CM | POA: Diagnosis not present

## 2017-08-12 DIAGNOSIS — J9612 Chronic respiratory failure with hypercapnia: Secondary | ICD-10-CM | POA: Diagnosis not present

## 2017-08-12 DIAGNOSIS — I2510 Atherosclerotic heart disease of native coronary artery without angina pectoris: Secondary | ICD-10-CM | POA: Diagnosis not present

## 2017-08-12 DIAGNOSIS — C494 Malignant neoplasm of connective and soft tissue of abdomen: Secondary | ICD-10-CM | POA: Diagnosis not present

## 2017-08-12 DIAGNOSIS — J9611 Chronic respiratory failure with hypoxia: Secondary | ICD-10-CM | POA: Diagnosis not present

## 2017-08-12 DIAGNOSIS — I119 Hypertensive heart disease without heart failure: Secondary | ICD-10-CM | POA: Diagnosis not present

## 2017-08-12 DIAGNOSIS — J438 Other emphysema: Secondary | ICD-10-CM | POA: Diagnosis not present

## 2017-08-14 DIAGNOSIS — J9612 Chronic respiratory failure with hypercapnia: Secondary | ICD-10-CM | POA: Diagnosis not present

## 2017-08-14 DIAGNOSIS — I119 Hypertensive heart disease without heart failure: Secondary | ICD-10-CM | POA: Diagnosis not present

## 2017-08-14 DIAGNOSIS — J438 Other emphysema: Secondary | ICD-10-CM | POA: Diagnosis not present

## 2017-08-14 DIAGNOSIS — J9611 Chronic respiratory failure with hypoxia: Secondary | ICD-10-CM | POA: Diagnosis not present

## 2017-08-14 DIAGNOSIS — I2510 Atherosclerotic heart disease of native coronary artery without angina pectoris: Secondary | ICD-10-CM | POA: Diagnosis not present

## 2017-08-14 DIAGNOSIS — C494 Malignant neoplasm of connective and soft tissue of abdomen: Secondary | ICD-10-CM | POA: Diagnosis not present

## 2017-08-16 DIAGNOSIS — I2510 Atherosclerotic heart disease of native coronary artery without angina pectoris: Secondary | ICD-10-CM | POA: Diagnosis not present

## 2017-08-16 DIAGNOSIS — J9611 Chronic respiratory failure with hypoxia: Secondary | ICD-10-CM | POA: Diagnosis not present

## 2017-08-16 DIAGNOSIS — J438 Other emphysema: Secondary | ICD-10-CM | POA: Diagnosis not present

## 2017-08-16 DIAGNOSIS — I119 Hypertensive heart disease without heart failure: Secondary | ICD-10-CM | POA: Diagnosis not present

## 2017-08-16 DIAGNOSIS — J9612 Chronic respiratory failure with hypercapnia: Secondary | ICD-10-CM | POA: Diagnosis not present

## 2017-08-16 DIAGNOSIS — C494 Malignant neoplasm of connective and soft tissue of abdomen: Secondary | ICD-10-CM | POA: Diagnosis not present

## 2017-08-18 DIAGNOSIS — I2510 Atherosclerotic heart disease of native coronary artery without angina pectoris: Secondary | ICD-10-CM | POA: Diagnosis not present

## 2017-08-18 DIAGNOSIS — J9612 Chronic respiratory failure with hypercapnia: Secondary | ICD-10-CM | POA: Diagnosis not present

## 2017-08-18 DIAGNOSIS — J438 Other emphysema: Secondary | ICD-10-CM | POA: Diagnosis not present

## 2017-08-18 DIAGNOSIS — J9611 Chronic respiratory failure with hypoxia: Secondary | ICD-10-CM | POA: Diagnosis not present

## 2017-08-18 DIAGNOSIS — I119 Hypertensive heart disease without heart failure: Secondary | ICD-10-CM | POA: Diagnosis not present

## 2017-08-18 DIAGNOSIS — C494 Malignant neoplasm of connective and soft tissue of abdomen: Secondary | ICD-10-CM | POA: Diagnosis not present

## 2017-08-23 DIAGNOSIS — I2510 Atherosclerotic heart disease of native coronary artery without angina pectoris: Secondary | ICD-10-CM | POA: Diagnosis not present

## 2017-08-23 DIAGNOSIS — J438 Other emphysema: Secondary | ICD-10-CM | POA: Diagnosis not present

## 2017-08-23 DIAGNOSIS — I119 Hypertensive heart disease without heart failure: Secondary | ICD-10-CM | POA: Diagnosis not present

## 2017-08-23 DIAGNOSIS — J9611 Chronic respiratory failure with hypoxia: Secondary | ICD-10-CM | POA: Diagnosis not present

## 2017-08-23 DIAGNOSIS — C494 Malignant neoplasm of connective and soft tissue of abdomen: Secondary | ICD-10-CM | POA: Diagnosis not present

## 2017-08-23 DIAGNOSIS — J9612 Chronic respiratory failure with hypercapnia: Secondary | ICD-10-CM | POA: Diagnosis not present

## 2017-08-24 DIAGNOSIS — Z803 Family history of malignant neoplasm of breast: Secondary | ICD-10-CM | POA: Diagnosis not present

## 2017-08-24 DIAGNOSIS — Z8551 Personal history of malignant neoplasm of bladder: Secondary | ICD-10-CM | POA: Diagnosis not present

## 2017-08-24 DIAGNOSIS — Z8042 Family history of malignant neoplasm of prostate: Secondary | ICD-10-CM | POA: Diagnosis not present

## 2017-08-24 DIAGNOSIS — Z800 Family history of malignant neoplasm of digestive organs: Secondary | ICD-10-CM | POA: Diagnosis not present

## 2017-08-24 DIAGNOSIS — C494 Malignant neoplasm of connective and soft tissue of abdomen: Secondary | ICD-10-CM | POA: Diagnosis not present

## 2017-08-24 DIAGNOSIS — Z801 Family history of malignant neoplasm of trachea, bronchus and lung: Secondary | ICD-10-CM | POA: Diagnosis not present

## 2017-08-24 DIAGNOSIS — Z807 Family history of other malignant neoplasms of lymphoid, hematopoietic and related tissues: Secondary | ICD-10-CM | POA: Diagnosis not present

## 2017-08-25 DIAGNOSIS — I119 Hypertensive heart disease without heart failure: Secondary | ICD-10-CM | POA: Diagnosis not present

## 2017-08-25 DIAGNOSIS — J438 Other emphysema: Secondary | ICD-10-CM | POA: Diagnosis not present

## 2017-08-25 DIAGNOSIS — J9612 Chronic respiratory failure with hypercapnia: Secondary | ICD-10-CM | POA: Diagnosis not present

## 2017-08-25 DIAGNOSIS — I2510 Atherosclerotic heart disease of native coronary artery without angina pectoris: Secondary | ICD-10-CM | POA: Diagnosis not present

## 2017-08-25 DIAGNOSIS — J9611 Chronic respiratory failure with hypoxia: Secondary | ICD-10-CM | POA: Diagnosis not present

## 2017-08-25 DIAGNOSIS — C494 Malignant neoplasm of connective and soft tissue of abdomen: Secondary | ICD-10-CM | POA: Diagnosis not present

## 2017-08-26 DIAGNOSIS — C494 Malignant neoplasm of connective and soft tissue of abdomen: Secondary | ICD-10-CM | POA: Diagnosis not present

## 2017-08-26 DIAGNOSIS — K7689 Other specified diseases of liver: Secondary | ICD-10-CM | POA: Diagnosis not present

## 2017-08-26 DIAGNOSIS — R918 Other nonspecific abnormal finding of lung field: Secondary | ICD-10-CM | POA: Diagnosis not present

## 2017-08-29 DIAGNOSIS — C494 Malignant neoplasm of connective and soft tissue of abdomen: Secondary | ICD-10-CM | POA: Diagnosis not present

## 2017-08-29 DIAGNOSIS — J9611 Chronic respiratory failure with hypoxia: Secondary | ICD-10-CM | POA: Diagnosis not present

## 2017-08-29 DIAGNOSIS — J9612 Chronic respiratory failure with hypercapnia: Secondary | ICD-10-CM | POA: Diagnosis not present

## 2017-08-29 DIAGNOSIS — J438 Other emphysema: Secondary | ICD-10-CM | POA: Diagnosis not present

## 2017-08-29 DIAGNOSIS — I119 Hypertensive heart disease without heart failure: Secondary | ICD-10-CM | POA: Diagnosis not present

## 2017-08-29 DIAGNOSIS — I2510 Atherosclerotic heart disease of native coronary artery without angina pectoris: Secondary | ICD-10-CM | POA: Diagnosis not present

## 2017-08-30 DIAGNOSIS — C494 Malignant neoplasm of connective and soft tissue of abdomen: Secondary | ICD-10-CM | POA: Diagnosis not present

## 2017-10-10 ENCOUNTER — Ambulatory Visit (INDEPENDENT_AMBULATORY_CARE_PROVIDER_SITE_OTHER): Payer: Medicare Other | Admitting: Pulmonary Disease

## 2017-10-10 ENCOUNTER — Encounter: Payer: Self-pay | Admitting: Pulmonary Disease

## 2017-10-10 VITALS — BP 142/74 | HR 76 | Temp 98.0°F | Ht 66.0 in | Wt 234.6 lb

## 2017-10-10 DIAGNOSIS — I2510 Atherosclerotic heart disease of native coronary artery without angina pectoris: Secondary | ICD-10-CM

## 2017-10-10 DIAGNOSIS — M150 Primary generalized (osteo)arthritis: Secondary | ICD-10-CM

## 2017-10-10 DIAGNOSIS — C679 Malignant neoplasm of bladder, unspecified: Secondary | ICD-10-CM

## 2017-10-10 DIAGNOSIS — I420 Dilated cardiomyopathy: Secondary | ICD-10-CM

## 2017-10-10 DIAGNOSIS — I272 Pulmonary hypertension, unspecified: Secondary | ICD-10-CM | POA: Diagnosis not present

## 2017-10-10 DIAGNOSIS — J9611 Chronic respiratory failure with hypoxia: Secondary | ICD-10-CM

## 2017-10-10 DIAGNOSIS — C499 Malignant neoplasm of connective and soft tissue, unspecified: Secondary | ICD-10-CM

## 2017-10-10 DIAGNOSIS — Z87891 Personal history of nicotine dependence: Secondary | ICD-10-CM | POA: Diagnosis not present

## 2017-10-10 DIAGNOSIS — Z9989 Dependence on other enabling machines and devices: Secondary | ICD-10-CM

## 2017-10-10 DIAGNOSIS — E669 Obesity, unspecified: Secondary | ICD-10-CM

## 2017-10-10 DIAGNOSIS — J449 Chronic obstructive pulmonary disease, unspecified: Secondary | ICD-10-CM

## 2017-10-10 DIAGNOSIS — M159 Polyosteoarthritis, unspecified: Secondary | ICD-10-CM

## 2017-10-10 DIAGNOSIS — J9612 Chronic respiratory failure with hypercapnia: Secondary | ICD-10-CM

## 2017-10-10 DIAGNOSIS — I10 Essential (primary) hypertension: Secondary | ICD-10-CM

## 2017-10-10 NOTE — Patient Instructions (Signed)
Today we updated your med list in our EPIC system...    Continue your current medications the same...  Keep up the good work w/ your NEBULIZER treatments and Oxygen therapy...  We will contact [employer] regarding BiPAP mask & tubing supplies...  We will contact [facility] regarding home phys therapy...  Call for any questions or if we can be of service in any way...  Let's plan a follow up visit in 40mo, sooner if needed for problems.Marland KitchenMarland Kitchen

## 2017-10-11 ENCOUNTER — Telehealth: Payer: Self-pay | Admitting: Pulmonary Disease

## 2017-10-11 ENCOUNTER — Encounter: Payer: Self-pay | Admitting: Pulmonary Disease

## 2017-10-11 DIAGNOSIS — M150 Primary generalized (osteo)arthritis: Secondary | ICD-10-CM | POA: Diagnosis not present

## 2017-10-11 DIAGNOSIS — J449 Chronic obstructive pulmonary disease, unspecified: Secondary | ICD-10-CM | POA: Diagnosis not present

## 2017-10-11 DIAGNOSIS — J9611 Chronic respiratory failure with hypoxia: Secondary | ICD-10-CM | POA: Diagnosis not present

## 2017-10-11 DIAGNOSIS — J9612 Chronic respiratory failure with hypercapnia: Secondary | ICD-10-CM | POA: Diagnosis not present

## 2017-10-11 DIAGNOSIS — C499 Malignant neoplasm of connective and soft tissue, unspecified: Secondary | ICD-10-CM | POA: Diagnosis not present

## 2017-10-11 DIAGNOSIS — C679 Malignant neoplasm of bladder, unspecified: Secondary | ICD-10-CM | POA: Diagnosis not present

## 2017-10-11 NOTE — Telephone Encounter (Signed)
Requesting PT orders 2 times for 1 week, 3 times per week for 3 weeks, 2 times a week for 4 weeks. SN please advise.

## 2017-10-11 NOTE — Telephone Encounter (Signed)
Per SN: This is fine to order as suggested. Thanks.

## 2017-10-11 NOTE — Telephone Encounter (Signed)
ATC Vicente Males- unable to leave vm due to mailbox being full.  Will call back.

## 2017-10-11 NOTE — Progress Notes (Signed)
Subjective:    Patient ID: [name], male    DOB: 1946-11-15, 70 y.o.   MRN: [id number]  HPI 71 y/o WM here for a follow up visit and on-going management of mult med problems...  ~  SEE PREV EPIC NOTES FOR OLDER DATA >>    CXR 7/14 showed COPD/ emphysema, incr markings and scarring at the bases, NAD...  LABS 7/14:  FLP- ok on Simva20;  Chems- ok x BS=120 A1c=6.7;  CBC- wnl;  TSH=1.26;  PSA=0.41...  [name] has established Primary Care follow up w/ [name] at [name]>>   CXR 7/15 showed underlying COPD, mild bibasilar fibrosis, norm heart size, DJD in spine, NAD...   PFT 7/15 showed> FVC=3.18 (78%), FEV1=1.22 (38%), %1sec=38; mid-flows=13% predicted... This is c/w GOLD Stage3 COPD and we discussed this...  ~  December 05, 2014:  78moROV & pulmonary recheck; [name] continued smoking, perhaps decr slightly to 1/2ppd but unable to quit & he has gained 6# in the interim up to 221# today;  His CC is sinusitis w/ nasal congestion, drainage & green mucus plus maxillary/ frontal HAs; he denies f/c/s, notes stable cough & chest symptoms w/ DOE;  He has been taking his Advair250Bid but prev refused addition of an anticholinergic due to "the donut hole";  States his breathing is "fine, except in cold air" ( we discussed wearing a scarf in winter etc)... He has GOLD Stage3 COPD w/ FEV1=1.22 (38%predicted) done 7/15;;  Exam shows decr BS bilat w/ few scat rhonchi & mild exp wheezing esp w/ forced maneuver...     We reviewed prob list, meds, xrays and labs> see below for updates >> [place] [name]... PLAN>> we discussed treating his acute max sinusitis w/ Augmentin875Bid, plus align daily & nasal saline as needed; he knows the importance of smoking cessation but is not motivated & declines smoking cessation help; he will continue the Advair250Bid 7 we are adding spiriva via handihaler once per day; we plan ROV in 669mo/ f/u CXR & PFT,  call in the interim for any breathing issues....   ~  June 05, 2015:  25m55moV & [name] a good interval, no new complaints or concerns; still smoking +/-1ppd and blames family stress; notes cough, [name] yellow sput, no hemoptysis, SOB w/o change/stable- still mows 3 yards, does chores, tends to 20+chickens & their eggs, etc; he has repeatedly declined smoking cessation help, clearly not motivated to quit, not even doing his inhalers regularly!!!    COPD, +smoker> on Advair250Bid & Spiriva daily; symptoms as above, he is stoic, still smoking 1ppd & NOT motivated to quit, asked to cut back & use both inhalers regularly...    HBP> on Metop25Bid, Amlod10, Lisin40; BP=114/64 & he denies CP, palpit, SOB, edema...    CAD> on ASA81; followed by [name] & seen 4/15 (overdue for f/u)> CAD, IWMI 1995, mult PCIs, last Myoview 2013 was low risk; too sedentary, no changes made; 73 70o brother died w/ AAA rupture...    Chol> on Simva20; FLP 7/14 showed TChol 158, TG 145, HDL 33, LDL 96; needs better diet & wt reduction and ret for Fasting blood work overdue.    DM> on diet alone; LABS 7/14 showed BS= 120, A1c= 6.7; we reviewed low carb diet, exercise program, and wt reduction...    Obese> wt recorded as 204# today but it doesn't look like he's lost 26#; we reviewed diet &  exercise...    GI- Gastritis, polyps> prev on Zantac; prev colon 2004 w/ hyperplastic polyp removed & f/u 8/14 by [name] w/ 3 polyps removed (5-60m size, all were tubular adenomas), +divertics in sigmoid, f/u planned 5 yrs... NOTE> exam showed a small palp knot in mid-abd above the umbilicus ?etiology (I offered CT Abd but he declines & wants to discuss w/ his primary- [name]).    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    DJD> on OTC analgesics  as needed...    Anxiety> on Alpraz0.18m57mrn... We reviewed prob list, meds, xrays and labs> see below for updates >>  NOTE> HE IS FOLLOWED BY [name] for GEN MED/ PCP now...  CXR 06/05/15 showed stable heart size, Ao atherosclerosis & tortuousiy, COPD/emphysema, incr markings at the bases, arthritis in Tspine...  LABS 7/16: pt asked to ret for fasting blood work... IMP/PLAN>>  [name] he is stable but continues to smoke 1ppd; CXR w/ COPD/emphysema, atherosclerotic Ao, chronic changes but NAD- we discussed the low-dose screening CT Chest for the early detection of lung cancer (he is a candidate & he will consider participating in this program); in the meanwhile- continue Advair/ Spiriva regularly;  He is overdue for Cards f/u w/ [name], and needs to continue Q6mo29mo w/ [name] (w/ copy of notes to us);[place] will ret for FASTING blood work.  ADDENDUM>> given his age and smoking history, [name] candidate for LUNG CANCER SCREENING w/ LOW-DOSE CT Chest>>  This office visit constitutes a face-to-face visit for the purpose of lung cancer screening counseling and a shared decision making visit...  Eligibility for LDCT screening:  1) Age=70 (must be 55-77y/o);  2) Absence of symptoms of lung cancer: he denies CP, ch in SOB, hemoptysis.  Smoking History:  1) He has 60+ pk-yrs (must have >30 pk-yr hx smoking);  2) Smoking status- still smoking ~1ppd (current or former smoker who quit<15 yrs ago).  Counseling:  Importance of smoking cessation &/or continued abstinence discussed ; offered smoking cessation interventions- pt declined; pt agreed to continued LDCT screening annually.  Shared decision making:  We reviewed the potential benefits and harms of screening> eg. Early detection of lung cancer and improved survival, the need for additional diagnostic tests & possible surgery, the risk of over-diagnosis/ false positives/ and radiation exposure... All questions were answered to the best  of my ability & he would like to consider the LDCT screening & he will let us kKoreaw his decision=> he ignored this discussion & never called.  ~  October 31, 2015:  5 month ROV & post hospital follow up visit>  [name] was Adm 12/5 - 10/29/15 by [facility] w/ acute hypoxemic resp failure precipitated by a COPD exac; for his part he noted incr SOB, cough, green sput, & "slowing down", he denied f/c/s, no CP, etc;  In the ER he was hypoxemic w/ O2sats in the 70s on RA and eval revealed> CXR- COPD, bullous dis, incr markings at bases, NAD; Cultures of blood/ sput/ urine were all neg; Serologies for strep & legionella were neg; Viral panel was neg;  LABs were OK (WBC=7.9=>14, Hg=15.3=>13.9, BS=100-140, BNP=498);  EKG showed NSR, rate78, NSSTTWA;  2DEcho 10/21/15 showed mildLVH, norm LVF w/ EF=55-60%, no regional wall motion abn, LA-mod  dil, RA- mod to severe dil, RV- mod dil, mod TR, PAsys-70mHg... He was treated w/ Oxygen, IV Solumedrol, Rocephin/ Zithromax, NEBS, Lovenox, flutter, etc; he was disch on Home O2- 2L/min activ & 1L/min rest, Pred taper, Levaquin, Advair250/ Spiriva...  He returns today feeling better, notes his home pulse-ox checks are improving & he has less SOB/DOE, less cough/sput, etc;  He says he has not smoked since PTA...      EXAM shows Afeb, VSS, O2sat=94% on 2L/min [place];  Wt=210# (he was 204 prev);  HEENT- neg, mallampati1;  Chest- decr BS bilat, few scat rhonchi, no w/r/consolidation;  Heart- RR w/o m/r/g;  Abd- obese, soft;  Ext- neg w/o c/c/e...  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    ~  November 21, 2015:  3wk ROV & MrWicker reports that he feels much better, SOB improved, cough improved, still prod of a Andrew amt green sput w/o blood w/o f/c/s;  He's been using the NEB w/ Albut TID followed by his AdvairBid & SpirivaQd; he is off the Pred now, he is too sedentary & not exercising => he needs PULM REHAB & we will refer, [facility] we will inquire as to what he might be elig for...       EXAM shows Afeb, VSS, O2sat=98% on 2L/min [place];  Wt=216# (he was 204-210# prev);  HEENT- neg, mallampati2;  Chest- decr BS bilat, otherw clear w/o w/r/r;  Heart- RR w/o m/r/g;  Abd- obese, soft;  Ext- neg w/o c/c/e...  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 We plan ROV in 6 wks.  ~  February 03, 2016:  2-376moOV & ErSharman Crateeturns w/ his severe obstructive lung dis, GOLD Stage 3, chr hypoxemic resp failure w/ pulmHTN (WHO group3); he quit smoking 10/2015 & is using home O2 3L/min, NEBS w/ Albut tid, Advair250Bid, Spiriva daily, Mucinex etc... He reports that breathing is stable overall but he is c/o 51m86mo URI w/ cough, thick yellow sput hard to produce, incr SOB 7 chest tightness but no fever, no CP=> he wants another antibiotic... He spent much time ventilating about his problems w/ DME[facility], his O2 set-up, & [facility] program- they still haven't contacted him about starting Rehab... He does have mult entries into EPIC from [employer] reviewed...     EXAM shows Afeb, VSS, O2sat=92% on 3L/min [place];  Wt=231# (up 15# & he was 204-210# prev);  HEENT- neg, mallampati2;  Chest- decr BS bilat, otherw clear w/o w/r/r;  Heart- RR w/o m/r/g;  Abd- obese, soft;  Ext- neg w/o c/c/e.  PFT 02/03/16>  FVC=2.57 (68%), FEV1=1.05 (38%), %1sec=41, mid-flows reduced at 18% predicted; post bronchodil  FEV1 w/o change;  TLC=6.67 (107%), RV=3.97 (176%), RV/TLC=60;  DLCO=22% and DL/VA=29%... This is c/w a severe obstructive ventilatory defect w/ air trapping and severely decr DLCO c/w emphysema- GOLD Stage  3 COPD... IMP/PLAN>>  We discussed continuing the NEBS Tid followed by Advair250Bid & Spiriva daily; add Mucinex 1279mBid & we will Rx w/ Levaquin500/d x7d for his bronchitic infection (see AVS);  He still needs a CT Chest but wants to wait for now;  [name], and he wants diff DME supplier- we will inquire, plan ROV 378mo. ADDENDUM 03/18/16>> Pt unable to afford inhalers- ADVAIR & SPIRIVA discontinued and placed on NEBULIZER w/ DUONEB QID & PULMICORT 0.25 BID regularly (we will update med list to reflect this change).  ~  May 05, 2016:  54m78moV & [name] notes DOE w/ exertion- he is in PulHealth Netusing up to 6L/min w/ exercise (he tried Oxy[employer] 1 Automotiveusing 3-4L/min at rest & Qhs; he feels that he is making some progress & I reminded him the DIET + EXERCISE and wt loss would be very beneficial to his breathing; states he is walking & mowing on his off days (ie- when not in rehab); despite everything his wt continues to climb- now up 4# to 235# "exercise makes me hungry"... we reviewed the following [name] problems during today's office visit >> his new PCP is [name]...    COPD, ex-smoker, chr hypoxemic RF> on NEB w/ Duoneb Qid + Budes-Bid + HFA prn (Advair & Spiriva too $$); he finally quit smoking, symptoms as above, he is now in pulm rehab but having issues w/ low sats...    HBP> on MetopER-78m51mM&1PM), Amlod10, Lisin10, Lasix40Bid; BP=136/66 & he denies CP, palpit, SOB, edema...    CAD> on ASA81, Imdur30; followed by [name] & seen 04/2016> CAD, IWMI 1995, mult PCIs, last Myoview 2013 was low risk; too sedentary, edema decr w/ Lasix; 73 y39 brother died w/ AAA rupture...    Chol> on Simva20; FLP 12/16 showed TChol 138, TG 75, HDL 25, LDL 98; needs better diet & wt  reduction and better compliance.    DM> on diet alone; LABS 5/17 showed BS= 118, A1c= per PCP; we reviewed low carb diet, exercise program, and wt reduction...    Obese> wt = 235#, 5'6"Tall, BMI=38;  we reviewed diet & exercise...    GI- Gastritis, polyps> prev on Zantac; prev colon 2004 w/ hyperplastic polyp removed & f/u 8/14 by [name] w/ 3 polyps removed (5-7mm 39me, all were tubular adenomas), +divertics in sigmoid, f/u planned 5 yrs... NOTE> exam showed a small palp knot in mid-abd above the umbilicus ?etiology (I offered CT Abd but he declines & wants to discuss w/ his primary- [name]).    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     DJD> on OTC analgesics as needed...    Anxiety> on Alpraz0.5mg pr7m. EXAM shows Afeb, VSS, O2sat=92% on 3L/min [name];  Wt=231# (up 15# & he was 204-210# prev);  HEENT- neg, mallampati3;  Chest- decr BS bilat, otherw clear w/o w/r/r;  Heart- RR w/o m/r/g;  Abd- obese, soft;  Ext- neg w/o c/c/e. IMP/PLAN>>  [name] is rec to continue current meds regularly, get on diet & get wt down , continue in PulmRehab, etc; we wrote for a ProairHFA rescue inhaler for prn use...   ~  August 05, 2016:  54mo ROV59moast visit we rec continuing same meds but stressed DIET, EXERCISE, wt reduction-- unfortunately he  has GAINED 8# up to 243# (BMI=39) in the interval;  He states that "I don't get hungry, I only eat one sandwich & sometimes a salad at night;  He says "they kicked me out" of the [facility] program due to low oxygens even w/ use of the Oxymizer;  He says he is exercising at home now;  He has established PCP w/ [name] & apparently he did labs which pt reports were "OK".    [name] c/o cough, chest congestion, yellow sput, ans a "sinus infection" w/ yellow drainage; notes incr SOB but denies CP/  f/c/s, edema;   EXAM shows Afeb, VSS, O2sat=92% on 3L/min [name];  Wt=231# (up 15# & he was 204-210# prev);  HEENT- neg, mallampati3;  Chest- decr BS bilat, otherw clear w/o w/r/r;  Heart- RR w/o m/r/g;  Abd- obese, soft;  Ext- neg w/o c/c/e. IMP/PLAN>>  [name] describes an upper resp infection & COPD exac=> Rx w/ Levaquin, Pred77m-4d taper, + his NEBS w/ duoneb Qid & Budes Bid; he desperately needs to get the weight down...  ~  October 13, 2016:  ETenochreturns and says "I've been worse" & indicates that he really likes the BiPAP he was placed on & disch with after his last Hosp 11/10 - 09/29/16; records reviewed by me- Adm w/ cough, yellow sput, chills and incr SOB; CXR revealed COPD, incr markings & bibasilar atx; Sput/ blood cultures and resp viral panel were all neg; LABS showed WBC~13K, anemia w/ Hg~11, Chems- ok x elev BS (his PCP is [name]); BNP was 29; ABGs showed pH=7.38, pCO2=58, pO2=79 on [place] O2; note that Bicarb level ~30;  2DEcho showed EF 60-65%, Gr1DD, PAsys~757mg;  He was felt to has a COPD exac, Cor Pulmonale- treated w/ BiPAP (he was INTOL to CPAP), given IV Solumedrol (DC on Pred taper), IV Zithromax, NEB treatments and improved...    He now returns & notes that he likes the BiPAP, feels like it is really helping (resting better, sleeps thru the night, wakes refreshed, no daytime sleepiness, & naps 2/7 days now) & wants to continue the BiPAP; NOTE that we had rec a sleep study to him on mult occas but he has always refused!  He now agrees to a sleep study, hoping that it will lead to medicare approval for his home BiPAP => HE QUALIFIES FOR BiPAP BASED ON COPD DIAGNOSIS w/ ABG SHOWING pCO2>52 ON HIS NASAL CANNULA O2, he needs the SLEEP OXIMETRY STUDY confirming O2sat<88% for cumulative >77m21mwhile on his prescribed O2, and OSA/CPAP treatment have been considered & ruled out... He has a home O2conc & a POC which he uses 3-4L pulse dose w/ his home sats in the 90s at rest & down to 88% w/  exerc...     COPD, mixed type w/ emphysema>  He has GOLD Stage3 COPD & on O2 as noted, NEBS w/ DUONEB Qid & BUDESONIDE0.25Bid, plus ProairHFA prn when out & about; [facility] the Pred [facility]...    Ex-smoker>  He quit late in 2016...    Chronic resp failure w/ hypoxemia and hypercarbia>  On above meds + ASA, Metoprolol, Imdur, Lasix    Cor pulmonale, secondary pulm hypertension>  He is on O2 24/7 using 4L/min continuous at night & 4L/min pulse dose during the day...    CARDIAC issues>  HBP, CAD w/ IWMI 1995 & mult PCIs, HL (on Simva20)- all followed by [name]...    [name] issues>  HBP,  HL, DM, obesity, gastritis, divertics, colon polyps, BPH, hx bladder cancer, DJD, anxiety EXAM shows Afeb, VSS, O2sat=97% on 4L/min [name];  Wt=240#;  HEENT- neg, mallampati3;  Chest- decr BS bilat, otherw clear w/o w/r/r;  Heart- RR w/o m/r/g;  Abd- obese, soft;  Ext- neg w/o c/c/e.  CXR 09/26/16>  Norm heart size, incr bibasilar atx... IMP/PLAN>>  [name] needs to continue his O2, NEBS w/ Duoneb & Budes regularly; he wants to continue the BiPAP & we need to see if he meets Medicare's criteria for BiPAP device- proceed w/ Sleep Study...   ADDENDUM>>  Sleep Study 10/15/16>  AHI=1.8/hr (8 hypopneas & 4 RERAs;  AHI during REM = 17.6/hr;  Study done on 4LO2 & min O2sat= 88%;  Mod snoring noted;  No arrhythmias;  PLMS index= 37, but assoc arousals was only 0.2;  He appears to meet the criteria for BiPAP & we will submit to [employer]/ [employer]... Plan ROV recheck in 6-8 weeks.  ~  November 24, 2016:  6wks ROV and pulmonary follow up visit>  [name] is stable w/ his severe disease- on BiPAP Qhs, Home O2- 24/7, NEB w/ Duoneb Qid & Budes Bid, he has rescue inhaler and [place] nasal mist;  His weight is all the way up to 257# (up 18# in 6wks) and c/o cough off & on, small amt yellow sput, no f/c/s, SOB "the same" but notes incr DOE w/ walking; unfortunately he has been very sedentary- not exercising at all  (prev in pulm rehab & they discharged him from the program)...     SEE PROBLEM LIST-- above... EXAM shows Afeb, VSS, O2sat=97% on 4L/min [place];  Wt=240#;  HEENT- neg, mallampati3;  Chest- decr BS bilat, otherw clear w/o w/r/r;  Heart- RR w/o m/r/g;  Abd- obese, soft;  Ext- neg w/o c/c/e.  LABS 11/24/16>  Chems- ok w/ BS=117, Cr=0.64, LFTs=wnl;  CBC- anemic w/ Hg=10.53mv=95, wbc-norm;  Fe=191 (sat=56%), Ferritin=109;  TSH=1.28...  Abd SONAR => ordered but never done...  Last CT Chest 08/19/16> mild cardiomeg w/ aortic & coronary atherosclerotic changes & PA enlargement w/ 3.4cm outflow tract; no adenopathy; severe bullous emphysema; bilat pulm nodules w/ 7.260mRUL nodule & 7.9 LLL lesion; Abd showssmall gallstones vs sludge & 6cm right renal cyst, mod thoracic spondylosis...  Last 2DEcho 09/29/16> mod conc LVH, norm LVF w/ EF=60-65%, no regional wall motion abn, Gr1 DD, norm valves, RV dil w/ norm sys function, PAsys=74 mmHg...  Last ONO > cannot find results IMP?PLAN>>  [name] severe COPD/emphysema w/ pulmHTN & CorPulmonale; he refuses to fill inhalers due to costs & he is therefore on O2- 24/7, BiPAP nightly, NEBS w/ Duoneb Qid & Budes Bid, Mucinex 1200Bid, & asked to get into a regular exercise program;  He will continue regular f/u w/ CARDS- [name]; must work on weight reduction...  ~  January 27, 2017:  11m73moV & [name] was ADM by [facility] 3/7 - 01/26/17 w/ Acute on Chronic diastolic CHF, dilated cardiomyopathy, COPD exac, acute on chr resp failure;  He was diureses 30# of fluid 257# to 227# today but he is quite vehemently opposed to this being diastolicCHF and says it was pneumonia & wouldn't have happened if he had an antibiotic when he needed it (his words); Pulm consult from DrYacoub also remarked about pt being very upset at suggestion of CHF & demanded to see pulmonary...             ...Marland KitchenMarland Kitchen CXR 01/22/17 showed cardiomegaly, aortic atherosclerosis, bilat emphysema &  interstitial prominence in the  bases, no consolidation or effusion..  EKGs 01/2017 showed NSR, poor R prog V1-2, one PVC pair...  2DEcho 01/20/17 was a poor quality study  But showed mild conc LVH (prev mod), norm LVF w/ EF=55-60% & no RWMA, Ao root mildly dil, AoV mild calcif leaflets, MV norm, LAdil 644m, RV dil, norm RVF, PAsys- not meas (([id number]  LABS showed BS~130-180 range, Cr=0.8-1.1 range, Hg~10-11 range, WBC~10=>6K, Troponins- neg, BNP=35...    He was treated w/ [id number] placed on BiPAP, Oxygen, NEBS, Solumedrol, & empiric antibiotics; He was seen by Pulmonary & transitioned to oral Levaquin, Prednisone, & continued on his home regimen of Oxygen- 24/7, BiPAP Qhs, NEBS w/ DUONEB Qid & Budesonide Bid, Mucinex '1200mg'$  Bid; Lasix trimmed to '40mg'$ Bid at disch yest... Since disch he is c/o "weak", notes some cough, sput, & SOB; we discussed keeping an Ab on hand so he can take it when he wants at the earliest sign of a resp infection- Levaquin '500mg'$  daily #10 w/ refill written at his request, reminded to always take a probiotic like ALIGN & activia yogurt while on the broad spectrum antibiotics...     EXAM shows Afeb, VSS, O2sat=97% on 4L/min [place];  Wt=227#;  HEENT- neg, mallampati3;  Chest- decr BS bilat, otherw clear w/o w/r/r;  Heart- RR w/o m/r/g;  Abd- obese, soft;  Ext- neg w/o c/c/e. IMP/PLAN>>  EBraxleyhas severe end-stage COPD- mixed chr bronchitis & emphysema w/ chr hypoxemia & hypercarbic resp failure, w/ Hx pulmHTN & right heart failure;  Now improved and approaching his baseline on max meds=> O2 at 2-4L/min 24/7 (he has [employer] that supplies 2L/min continuous or up to 6L/min pulse dose flow), nocturnal BiPAP, NEBs w/ Duoneb Qid & Budes 0.25Bid, Mucinex '1200mg'$ Bid, weaning oral Prednisone from his recent hosp, finished oral Levaquin; on ASA81, Lasix40Bid, Avapro75, Imdur30, K20, Zocor20, and off prev Metoprolol... We discussed continuing the Pred taper til gone, continue other meds the same for now & ROV recheck 141mo.  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  ADDENDUM>> CT Chest 02/17/17 (44m91mollow up CT Chest- compare to 08/19/16)>> mild cadiomeg w/ atherosclerotic calcif in Ao/ great vessels/ coronaries; no enlarged adenopathy; extensive bullous emphysema, areas of mild interstitial prominence, stable nodular opacities (largest 1.3 x 0.9 cm in inferior LLL abutting the pleura) & no new parenchymal lung opacity; upper abd shows atherosclerotic calcif of Ao, right renal cyst, & an incompletely vis mass in mid abd=> needs CT Abd... ADDENDUM>> CT Abd & Pelvis 02/18/17 showed a large 25 x 16 x 18 cm mass in right abd mesentary, no adenopathy, additional findings include ao & coronary calcif, mod centrilob emphysema, diffuse hepatic steatosis, cholelithiasis & mod left colonic diverticulosis...   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);  [facility]ults/ eval>>     Surg [place]/18> Dx w/ soft tissue sarcoma right abd, felt to be high risk for surg due to COPD & cardiac issues=> rec referral to [facility]  XRT by DrBlackstock- [facility] 03/18/17>  They planned 45 Gy in 25 fractions- completed by 05/06/17, taking imodium for loose stools    Pulmonary consult from DrPascual 04/28/17>  He felt that abd surg would pose a high risk of pulm complications given his age, chronic hypercarbic & hypoxemic resp failure, poor exercise capacity, & pulm HTN; in addition they felt he was on a good triple therapy regimen & did not pose any changes...    Pt notes that his abd has shrunk & his breathing is sl better;  still using 4-6L/min O2 & checks his pulse ox readings; he notes the O2 also helps his leg cramps "the nurse says it's oxygen deprivation" (using [facility]); he remains on BiPAP Qhs & rests well, wakes refreshed, denies daytime sleepiness;  He has been regular w/ his NEB rx using Duoneb Qid & Budesonide Bid + AlbutHFA rescue inhaler as needed...     EXAM shows Afeb, VSS, O2sat=99% on 4L/min [place];  Wt=227#;  HEENT- neg, mallampati3;  Chest- decr BS bilat, otherw clear w/o w/r/r;  Heart- RR w/o m/r/g;  Abd- protuberant abd, soft & nontender, norm BS;  Ext- VI, +1edema.  Last CXR was 01/22/17 showing cardiomegaly, aortic atherosclerosis, bilat emphysema & interstitial prominence in the bases, no consolidation or effusion..  Last CT as above - [facility] repeat abd CT in August (after the XRT treatment)...  PFTs in Care Everywhere>  [facility] 04/28/17 showed FVC=2.60 (70%), FEV1=0.97 (36%), %1sec=37%...  Labs in Care everywhere 04/2017>  [place]- ok x BS=125, Cr=0.64, LFTs=wnl... 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   ~  October 10, 2017:  23moROV & EObryanindicates to me that he was told the abd tumor mass had not shrunk enough to consider surgery, and the Oncology team is holding off on ChemoRx until it's necessary for tumor growing, recently it has been about the same... From the pulmonary standpoint he feels that he is breathing OK- notes tired & sl SOB in the afternoon, decr cough & sl beige sput, no hemoptysis; he is NOT very active & getting [facility] for PT;  Using O2 at 4L/min & O2sat=94%... We reviewed the following NOTES is Epic Care Everywhere >>       [name] of intra-abd site- he has completed 25 fractions of XRT on 05/06/17, notes some improvement in breathing & has decr O2 to 5L/min; notes some decr abd & leg swelling;  [facility] Abd[facility]Pelvis [facility]/24/17 [facility]>  IMPRESSION: 1)Stable large heterogeneously enhancing mass in the right abdomen compatible with spindle cell neoplasm. 2)843mhyperenhancing lesion in the left hepatic lobe is too small to characterize but favored to represent a flash filling hemangioma or arterioportal shunt. 3)Stable subcentimeter pulmonary nodules as described above. Severe emphysema. Given smoking history, Fleischner guidelines recommend follow-up chest CT in 18 months. 4)Stable right hilar lymph node measuring 1.3 cm short axis, favor reactive. 5)(Hepatic steatosis. 6)Splenomegaly.  DrLevine felt he was too high risk for surg based on his pulm & cardiac issues; REC for watchful waiting & f/u w/ med oncology for poss  chemoRx when approp...    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 2) Progressively enlarging left adrenal nodule, currently measuring 9 mm in diameter. Given the avid enhancement and enlargement, this would be concerning for a potential site of metastasis. 3) Extensive pulmonary emphysema. Small pulmonary nodules are indeterminate, but show no interval change from the oldest available comparison CT examinations. Continued attention on follow-up. 4) Additional unchanged findings as noted above;  They rec f/u CT scans in 4 months & close observation... PT was ordered for the pt...    10/03/17 GENETIC Testing results>  Mr. Joanne Brander was contacted by  telephone today to discuss his negative genetic test results. No pathogenic mutations were detected on a 43-gene panel for evaluation of hereditary cancer. Genetic testing was pursued based on patient's extensive personal and family history of cancer.   We reviewed the following [name] problems during today's office visit >>     COPD, mixed type w/ emphysema>  He has GOLD Stage3 COPD & on O2 as noted, NEBS w/ DUONEB Qid & BUDESONIDE0.25Bid, plus ProairHFA prn when out & about; off the Pred Rx given last during his 09/2016 [facility] & tapered off...    Ex-smoker>  He quit late in 2016...    Chronic resp failure w/ hypoxemia and hypercarbia>  On above meds + Lasix40Bid, K20; also on O2 at 4L/min regularly & BiPAP Qhs=> need to derview download data...    Cor pulmonale, secondary pulm hypertension>  He is on O2 24/7 using 4L/min continuous at night & 4L/min pulse dose during the day...    Intra-abdominal SARCOMA>  See above- eval & management from [place], XRT, Surg Oncology...    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    [name] issues>  HBP, HL, DM, obesity, gastritis, divertics, colon polyps, BPH, hx bladder cancer, DJD, anxiety  EXAM shows Afeb, VSS, O2sat=94% on 4L/min [place];  Wt=235#;  HEENT- neg, mallampati3;  Chest- decr BS bilat, otherw clear w/o w/r/r;  Heart- RR w/o m/r/g;  Abd- protuberant abd, soft & nontender, norm BS;  Ext- VI, +1edema.  LABS> pt reports labs from [name] recently were all OK... IMP/PLAN>>  I would like to see a BiPAP download, continue current meds the same & f/u scans as planned at [name]; we will recheck pt in 60mo sooner if needed prn...          Problem List:  Hx of SINUSITIS (ICD-473.9) - off Nasonex now & recent Rx w/ Augmentin/ Saline... notes occas sinus congestion, drainage, sl HA, etc... he knows that he needs to quit smoking! ~  10/10: Adm for  epistaxis w/ eval by DrWolicki- CXR= NAD, labs OK, rec for saline lavage- nose bleed resolved & disch home (no recurrence so far). ~  1/16: he presented w/ acute max & frontal sinusitis> Rx w/ Augmentin, Align, Saline, Mucinex, etc... ~  3/17: he has acute bronchitic exac- treated w/ Levaquin x7d + his regular meds...  COPD (ICD-496) >>   ~  on ADVAIR 250Bid; STILL SMOKES CIGARETTES & he's refused Chantix...  ~  He  had hemoptysis 6/99 without lesion on CXR, CTChest, or Bronchoscopy... ~  Adm 10/10 w/ epistaxsis & eval DrWolicki as noted> he wanted to do Sleep Study but pt cancelled it & declined to resched. 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 ~  CXR 5/12 showed ectatic calcif & tort Aorta, COPD w/ incr markings ar bases, NAD, +degen spondylosis. ~  CXR 6/13 showed COPD/ emphysema, scarring in the lower lobes, otherw clear lungs, DJD in spine... ~  CXR 7/14 showed COPD/ emphysema, incr markings and scarring at the bases, NAD. ~  1/15: [name] says he's decr his smoking from prev 3-4ppd down to 3 cig/d;  He remains on Advair250> states breathing is ok, at baseline CAT score=12; baseline cough, sput, no hemoptysis, stable DOE, no edema... ~  CXR 7/15 showed underlying COPD, mild bibasilar fibrosis, norm heart size, DJD in spine, NAD...  ~  PFT 7/15 showed> FVC=3.18 (78%), FEV1=1.22 (38%), %1sec=38; mid-flows=13% predicted... This is c/w GOLD Stage3 COPD and we discussed this> He is rec to quit all smoking immediately; he has been using the nicotine gum & rec to try patches, offered Wellbutrin, he refuses Chantix; needs to continue ADVAIR250Bid & samples givem; he declines to start [place] at this time due to cost & donut hole (we will address this again on return); reminded to use MUCINEX '600mg'$ - 1to2 Bid w/ fluids, and OK to use OTC DELSYM as needed for cough...   ~  1/16: he claims stable on Advair250 but still smoking & can't vs won't quit; we decided to add Spiriva daily via  handihaler... 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 ~  12/16:  He was [facility] for 9d w/ hypoxemic resp failure, COPD exac, and found to have pulmHTN w/ 2DEcho showing PAsys=51;  Treated w/ O2, Pred taper, antibiotics, NEBS, Advair/Spiriva, etc;  He says he has quit smoking... ~  [name] 10[facility]> CXR- COPD, bullous dis, incr markings at bases, NAD; Cultures of blood/ sput/ urine were all neg; Serologies for strep & legionella were neg; Viral panel was neg; He was treated w/ Oxygen, IV Solumedrol, Rocephin/ Zithromax, NEBS, Lovenox, flutter, etc; He was disch on Home O2- 2L/min activ & 1L/min rest, Pred taper, NEBS, Levaquin, Advair250/ Spiriva => improved. 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   HE HAS ESTABLISHED PRIMARY CARE w/ [name], [place] w/ [name] et al >>   HYPERTENSION (ICD-401.9) - controlled on METOPROLOL '25mg'$ Tid,  NORVASC '10mg'$ /d, LISINOPRIL '40mg'$ /d...   ~  11/11:  BP=122/70, not checking BP's at home... denies HA, fatigue, visual changes, CP, palipit, dizziness, syncope, dyspnea, edema, etc. ~  5/12:  BP= 120/72, pt very pleased w/ his wt loss & improved exercise ability. ~  6/13:  BP= 122/64 & feeling well w/o CP, palpit, SOB, edema, etc... ~  12/13:  BP=122/82 & he denies CP, palpit, SOB, edema... ~  7/14: on Metop25-3/d, Amlod10, Lisin40; BP=130/78 &  he denies CP, palpit, SOB, edema ~  1/15: BP remains stable on Metop25-2AM  & 1PM, Amlod5, Lisin40; BP= 120/72 and he denies CP, palpit, ch in SOB, edema. ~  2016> His meds have been adjusted- now on Rx w/ Amlod10, Lisin10; BP= 130/72 7 he denies CP, palpit, edema; chr SOB/DOE due to his COPD.  CORONARY ARTERY DISEASE (ICD-414.00) - ASA '81mg'$ /d & off Plavix as of 2/13 officially as he wasn't taking it regularly anyway. ~  Hx prev IWMI and PTCA/ stent in RCA by DrBrodie 12/95 & 5/96...  ~  Myoview 4/08 abnormal w/ inferior ischemia and subseq cath revealing restenoses in RCA- s/p repeat PTCA/ stents...  ~  f/u Myoview 9/08 w/ prev infer infarct, no ischemia, EF=56%... ~  9/11:  pre op cardiac eval DrBrodie> OK to hold ASA/ Plavix for bladder surg. ~  2/12: f/u [name] w/ hx CAD, MI in 95, mult PCIs last 2009> stable, on ASA/ Plavix, no changes made; EKG= NSR, WNL... ~  2/13:  He had f/u [name] w/ Myoview showing a fixed defect in infer wall, no ischemia, EF=58%; pt wasn't taking Plavix, therefore stopped in favor of ASA'81mg'$ /d... ~  3/14:  He had f/u [name]> CAD, IWMI 1995, mult PCIs, last Myoview 2013 was low risk; too sedentary, no changes made; 65 y/o brother died recently w/ AAA rupture... ~  EKG 3/14 showed NSR, rate65, WNL, NAD... ~  He maintains f/u w/ CARDS, [name]- known CAD, Hx inferior MI 1995, s/p mult PCIs- most recent 2009; finally qit smoking 12/16 after [facility] for AECOPD w/ hypoxemia; on ASA, statin, Amlod & Lisin.  HYPERCHOLESTEROLEMIA (Mixed Hyperlipidemia) - prev on Simva80 but DrBrodie decr him to SIMVASTATIN '20mg'$ /d + Fish Oil & Red Wine qd... ~  [place] 9/08 on Simva40 showed TChol 151, TG 106, HDL 23, LDL 107 ~  FLP 8/09 on Simva80 showed TChol 142, TG 135, HDL 28, LDL 87 ~  FLP 6/10 on Simva80 showed TChol 151, TG 117, HDL 31, LDL 97 ~  FLP in hosp 10/10 showed TChol 128, TG 110, HDL 26, LDL 80 ~  FLP 5/11 on Simva80 showed TChol 144, TG 215, HDL 26, LDL 89 ~  9/11:  DrBrodie decr him to Simva20... we discussed the need for better diet & wt  reduction. ~  FLP 5/12 on Simva20 showed TChol 162, TG 159, HDL 32, LDL 98 ~  FLP 6/13 on Simva20 showed TChol 130, TG 102, HDL 33, LDL 77... rec to incr exercise program & get wt down! ~  FLP 7/14 on Simva20 showed TChol 158, TG 145, HDL 33, LDL 96... He has gained way too much wt 7 must get on diet! ~  Followed by PCP- [name]...  DIABETES MELLITUS, BORDERLINE (ICD-790.29) - on diet alone... ~  labs 9/08 showed BS=100, HgA1c=6.1 ~  labs 8/09 showed BS= 107, A1c= 6.2 ~  labs 6/10 showed BS= 108, A1c= 6.2 ~  lab 5/11 (wt=244#) showed BS= 121. A1c= 6.7.[name]. he was told "get wt down, or start meds". ~  Labs 5/12 showed BS= 109, A1c= 6.1 ~  Labs 6/13 showed BS= 114, & he needs to lose the weight... ~  Labs 7/14 on diet alone showed BS= 120, A1c= 6.7 ~  Followed by PCP- [name]...  OBESITY (ICD-278.00) - weight was down as low as 202# in 2/09, and steadily incr to 244# by 5/11... ~  weight 5/11 = 244#.[name].  we reviewed diet + exercise necessary to lose weight... ~  weight 11/11 =  217# (after dx & rx for bladder cancer) ~  Weight 5/12 = 214# but says it's 192# at home... ~  Weight 6/13 = 211# but knows it's better since his pant/ belt is down to 36" (under his roll). ~  Weight 7/14 = 230# but he has quit smoking he says, now must lose the weight... ~  Weight 1/15 = 229# ~  Weight 1/17 = 216# ~  Weight 3/17 = 231# and we reviewed diet, exercise, wt reducing strategies...  GASTRITIS (ICD-535.50) - had EGD 10/05 showing gastritis... on RANITADINE '300mg'$ /d due to his Plavix Rx, off prev Omep.  COLONIC POLYPS (ICD-211.3) - last colonoscopy by [name] 3/04 showed 60m polyp = hyperplastic w/ f/u planned 124yr. 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 ~  4/12:  Pt reported f/u cysto was neg, doing well... ~  10/12:  Note from [name] reviewed> cysto was neg, no recurrent tumors... ~  4/13:  Note from [name] reviewed> neg f/u cysto w/o recurrent TCCa... He wants to continue Q6m23mostoscopies... ~  1/14:  He had f/u w/ [name]> Hx TCCa bladder, s/p TURBT 9/11, given BCG 11/11-12/11 and f/u Cysto 1/12 was neg; repeat cysto 1/14 was neg as well...  DEGENERATIVE JOINT DISEASE (ICD-715.90) - He has c/o right shoulder pain, hip pain, knee pain, and LBP> Rx ADVIL w/ some benefit... offered Ortho referral & he will decide.  ANXIETY (ICD-300.00) - on ALPRAZOLAM 0.'5mg'$  Prn...   Past Surgical History:  Procedure Laterality Date  . cataract surgery  septemeber 2013  . cataract surgery/sub posterior vitrectomy in left Andrew  1996   [name]. RanZadie Rhine PTCA  [phone] Outpatient Encounter Medications as of 10/10/2017  Medication Sig  . Albuterol Sulfate (PROAIR RESPICLICK) [id number] Base) MCG/ACT AEPB Inhale 2 puffs into the lungs every 6 (six) hours as needed (shortness of breath).  . ALPRAZolam (XANAX) 0.5 MG tablet Take 0.5 mg by mouth at bedtime.   . Ascorbic Acid (VITAMIN C) 1000 MG tablet Take 1,000 mg by mouth daily.  . aMarland Kitchenpirin 81 MG tablet Take 81 mg by mouth at bedtime.   . budesonide (PULMICORT) 0.25 MG/2ML nebulizer solution Take 2 mLs (0.25 mg total) by nebulization 2 (two) times daily.  . cholecalciferol (VITAMIN D) 400 units TABS tablet Take 400 Units by mouth at bedtime.  . CMarland KitchenNNAMON PO Take 1,000 mg by mouth daily.  . Flaxseed, Linseed, (FLAX SEED OIL PO) Take 1,400 mg by mouth daily.  . furosemide (LASIX) 40 MG tablet Take 40 mg by mouth 2 (two) times daily.  . Garlic 1005956 CAPS Take 1,000 mg by mouth daily.  . iMarland Kitchenuprofen (ADVIL,MOTRIN) 600 MG tablet Take 600 mg by mouth every 6 (six) hours as needed for moderate pain.  . iMarland Kitchenratropium-albuterol (DUONEB) 0.5-2.5 (3) MG/3ML SOLN Take 3 mLs by nebulization 4 (four)  times daily.  . isosorbide mononitrate (IMDUR) 30 MG 24 hr tablet Take 1 tablet (30 mg total) by mouth daily.  . lMarland Kitchensinopril (PRINIVIL,ZESTRIL) 10 MG tablet Take 10 mg by mouth daily.  . Multiple Vitamin (MULTIVITAMIN WITH MINERALS) TABS tablet Take 1 tablet by mouth daily.  . multivitamin-lutein (OCUVITE-LUTEIN) CAPS capsule Take 1 capsule by mouth 2 (two) times  daily.  . nitroGLYCERIN (NITROSTAT) 0.4 MG SL tablet Place 1 tablet (0.4 mg total) under the tongue every 5 (five) minutes as needed for chest pain.  . Omega-3 Fatty Acids (FISH OIL) 1000 MG CPDR Take 1,000 mg by mouth daily.  Vladimir Faster Glycol-Propyl Glycol (SYSTANE OP) Apply 1 drop to Andrew daily as needed (dry eyes).  . potassium chloride SA (K-DUR,KLOR-CON) 20 MEQ tablet Take 1 tablet (20 mEq total) by mouth daily. (Patient taking differently: Take 20 mEq by mouth every evening. )  . simvastatin (ZOCOR) 20 MG tablet Take 1 tablet (20 mg total) by mouth daily at 6 PM.  . sodium chloride (OCEAN) 0.65 % SOLN nasal spray Place 1 spray into both nostrils as needed for congestion.  . vitamin B-12 (CYANOCOBALAMIN) 1000 MCG tablet Take 1,000 mcg by mouth daily.   No facility-administered encounter medications on file as of 10/10/2017.     Allergies  Allergen Reactions  . Amlodipine Swelling    Current Medications, Allergies, Past [name] History, Past Surgical History, Family History, and Social History were reviewed in [employer] record.    Review of Systems        See HPI - all other systems neg except as noted... The patient complains of some dyspnea on exertion.  The patient denies anorexia, fever, weight loss, weight gain, vision loss, decreased hearing, hoarseness, chest pain, syncope, peripheral edema, prolonged cough, headaches, hemoptysis, abdominal pain, melena, hematochezia, severe indigestion/heartburn, hematuria, incontinence, muscle weakness, suspicious skin lesions, transient blindness, difficulty  walking, depression, unusual weight change, abnormal bleeding, enlarged lymph nodes, and angioedema.     Objective:   Physical Exam    WD, Obese, 71 y/o WM in NAD... Vital Signs:  Reviewed... GENERAL:  Alert & oriented; pleasant & cooperative... HEENT:  [place]/AT, EOM-wnl, PERRLA, EACs-clear, TMs-wnl, NOSE- sl congestion, THROAT-clear & wnl. NECK:  Supple w/ fairROM; no JVD; normal carotid impulses w/o bruits; no thyromegaly or nodules palpated; no lymphadenopathy. CHEST:  decr BS at bases bilat w/ few scat rhonchi, no wheezing/ rales/ or signs of consolidation... HEART:  Regular Rhythm; without murmurs/ rubs/ or gallops detected... ABDOMEN:  Obese, soft, & nontender; Andrew knot palpated in mid abd above umbilicus; normal bowel sounds; no organomegaly detected. EXT: without deformities, mild arthritic changes; no varicose veins/ +venous insuffic/ no edema. NEURO:  CN's intact; no focal neuro deficits... DERM:  No lesions noted; no rash etc...  RADIOLOGY DATA:  Reviewed in the EPIC EMR & discussed w/ the patient...  LABORATORY DATA:  Reviewed in the EPIC EMR & discussed w/ the patient...   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  11/21/15>  referred for PulmRehab, contact [place] re- POC, he still needs Full PFTs & CT Chest... We plan ROV in 6 wks 02/02/16> We discussed continuing the NEBS Tid followed by Advair250Bid & Spiriva daily; add Mucinex '1200mg'$ Bid & we will Rx w/ Levaquin500/d x7d for his bronchitic infection (see AVS);  He still needs a CT Chest but wants to wait for now;  Needs to get into the [facility] program  ASAP, and he wants  diff DME supplier- we will inquire, plan ROV 48mo6/21/17>   The ABethanywere too $$ so we switched to NEBS w/ Duoneb Qid & Budes Bid; added ProairHFA prn; he desperately needs to lose wt in conjunction w/ his PulmRehab etc... 08/05/16>   He presents w/ a COPD exac-- Rx w/ Levaquin, Pred20-4d taper + NEBS w/ duoneb Qid & budes Bid... 11/24/16>   EJaquelyn Bitterhas severe COPD/emphysema w/ pulmHTN & CorPulmonale; he refuses to fill inhalers due to costs & he is therefore on O2- 24/7, BiPAP nightly, NEBS w/ Duoneb Qid & Budes Bid, Mucinex 1200Bid, & asked to get into a regular exercise program;  He will continue regular f/u w/ CARDS- [name]; must work on weight reduction... 01/27/17>   EDeontayehas severe end-stage COPD- mixed chr bronchitis & emphysema w/ chr hypoxemia & hypercarbic resp failure, w/ Hx pulmHTN & right heart failure; s/p Hosp & now improved and approaching his baseline on max meds=> O2 at 2-4L/min 24/7 (he has [employer] that supplies 2L/min continuous or up to 6L/min pulse dose flow), nocturnal BiPAP, NEBs w/ Duoneb Qid & Budes 0.25Bid, Mucinex '1200mg'$ Bid, weaning oral Prednisone from his recent hosp, finished oral Levaquin; on ASA81, Lasix40Bid, Avapro75, Imdur30, K20, Zocor20, and off prev Metoprolol... We discussed continuing the Pred taper til gone, continue other meds the same for now & ROV recheck 189mo. 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 10/10/17>   I would like to see a  BiPAP download, continue current meds the same & [facility] 24m4moooner if needed prn...   Primary Care followed by [name]>> HBP>   CAD>            Followed by [name] & DrHWilburt FinlayPIDS>   DM>   GI> Hx Gastritis, Polyps>   NEW Dx of LARGE SARCOMA in Abd => eval & rx by [facility]>   Followed by [name]      Medication List        Accurate as of 10/10/17 11:59 PM. Always use your most recent med list.          ALPRAZolam 0.5 MG tablet Commonly known as:  XANAX   aspirin 81 MG tablet   budesonide 0.25 MG/2ML nebulizer solution Commonly known as:  PULMICORT Take 2 mLs (0.25 mg total) by nebulization 2 (two) times daily.   cholecalciferol 400 units Tabs tablet Commonly known as:  VITAMIN D   CINNAMON PO   Fish Oil 1000 MG Cpdr   FLAX SEED OIL PO   furosemide 40 MG tablet Commonly known as:  LASIX   Garlic [id number] Caps   ibuprofen 600 MG tablet Commonly known as:  ADVIL,MOTRIN   ipratropium-albuterol 0.5-2.5 (3) MG/3ML Soln Commonly known as:  DUONEB Take 3 mLs by nebulization 4 (four) times daily.   isosorbide mononitrate 30 MG 24 hr tablet Commonly known as:  IMDUR Take 1 tablet (  30 mg total) by mouth daily.   lisinopril 10 MG tablet Commonly known as:  PRINIVIL,ZESTRIL   multivitamin with minerals Tabs tablet   multivitamin-lutein Caps capsule   nitroGLYCERIN 0.4 MG SL tablet Commonly known as:  NITROSTAT Place 1 tablet (0.4 mg total) under the tongue every 5 (five) minutes as needed for chest pain.   potassium chloride SA 20 MEQ tablet Commonly known as:  K-DUR,KLOR-CON Take 1 tablet (20 mEq total) by mouth daily.   PROAIR RESPICLICK 481 (90 Base) MCG/ACT Aepb Generic drug:  Albuterol Sulfate   simvastatin 20 MG tablet Commonly known as:  ZOCOR Take 1 tablet (20 mg total) by mouth daily at 6 PM.   sodium chloride 0.65 % Soln nasal spray Commonly known as:  OCEAN Place 1 spray into both  nostrils as needed for congestion.   SYSTANE OP   vitamin B-12 1000 MCG tablet Commonly known as:  CYANOCOBALAMIN   vitamin C 1000 MG tablet     NOTE>> Advair & Spiriva discontinued due to cost & replaced by DUONEB-Qid & PULMICORT 0,'25mg'$ Bid via NEBULIZER.Marland KitchenMarland Kitchen

## 2017-10-12 NOTE — Telephone Encounter (Signed)
Vicente Males is aware of SN's below message and voiced her understanding. Nothing further needed.

## 2017-10-17 DIAGNOSIS — C679 Malignant neoplasm of bladder, unspecified: Secondary | ICD-10-CM | POA: Diagnosis not present

## 2017-10-17 DIAGNOSIS — C499 Malignant neoplasm of connective and soft tissue, unspecified: Secondary | ICD-10-CM | POA: Diagnosis not present

## 2017-10-17 DIAGNOSIS — M150 Primary generalized (osteo)arthritis: Secondary | ICD-10-CM | POA: Diagnosis not present

## 2017-10-17 DIAGNOSIS — J9611 Chronic respiratory failure with hypoxia: Secondary | ICD-10-CM | POA: Diagnosis not present

## 2017-10-17 DIAGNOSIS — J9612 Chronic respiratory failure with hypercapnia: Secondary | ICD-10-CM | POA: Diagnosis not present

## 2017-10-17 DIAGNOSIS — J449 Chronic obstructive pulmonary disease, unspecified: Secondary | ICD-10-CM | POA: Diagnosis not present

## 2017-10-19 DIAGNOSIS — J449 Chronic obstructive pulmonary disease, unspecified: Secondary | ICD-10-CM | POA: Diagnosis not present

## 2017-10-19 DIAGNOSIS — J9611 Chronic respiratory failure with hypoxia: Secondary | ICD-10-CM | POA: Diagnosis not present

## 2017-10-19 DIAGNOSIS — M150 Primary generalized (osteo)arthritis: Secondary | ICD-10-CM | POA: Diagnosis not present

## 2017-10-19 DIAGNOSIS — C679 Malignant neoplasm of bladder, unspecified: Secondary | ICD-10-CM | POA: Diagnosis not present

## 2017-10-19 DIAGNOSIS — J9612 Chronic respiratory failure with hypercapnia: Secondary | ICD-10-CM | POA: Diagnosis not present

## 2017-10-19 DIAGNOSIS — C499 Malignant neoplasm of connective and soft tissue, unspecified: Secondary | ICD-10-CM | POA: Diagnosis not present

## 2017-10-21 DIAGNOSIS — J9612 Chronic respiratory failure with hypercapnia: Secondary | ICD-10-CM | POA: Diagnosis not present

## 2017-10-21 DIAGNOSIS — C679 Malignant neoplasm of bladder, unspecified: Secondary | ICD-10-CM | POA: Diagnosis not present

## 2017-10-21 DIAGNOSIS — M150 Primary generalized (osteo)arthritis: Secondary | ICD-10-CM | POA: Diagnosis not present

## 2017-10-21 DIAGNOSIS — J449 Chronic obstructive pulmonary disease, unspecified: Secondary | ICD-10-CM | POA: Diagnosis not present

## 2017-10-21 DIAGNOSIS — J9611 Chronic respiratory failure with hypoxia: Secondary | ICD-10-CM | POA: Diagnosis not present

## 2017-10-21 DIAGNOSIS — C499 Malignant neoplasm of connective and soft tissue, unspecified: Secondary | ICD-10-CM | POA: Diagnosis not present

## 2017-10-26 DIAGNOSIS — J9611 Chronic respiratory failure with hypoxia: Secondary | ICD-10-CM | POA: Diagnosis not present

## 2017-10-26 DIAGNOSIS — C679 Malignant neoplasm of bladder, unspecified: Secondary | ICD-10-CM | POA: Diagnosis not present

## 2017-10-26 DIAGNOSIS — C499 Malignant neoplasm of connective and soft tissue, unspecified: Secondary | ICD-10-CM | POA: Diagnosis not present

## 2017-10-26 DIAGNOSIS — J449 Chronic obstructive pulmonary disease, unspecified: Secondary | ICD-10-CM | POA: Diagnosis not present

## 2017-10-26 DIAGNOSIS — M150 Primary generalized (osteo)arthritis: Secondary | ICD-10-CM | POA: Diagnosis not present

## 2017-10-26 DIAGNOSIS — J9612 Chronic respiratory failure with hypercapnia: Secondary | ICD-10-CM | POA: Diagnosis not present

## 2017-10-28 DIAGNOSIS — J449 Chronic obstructive pulmonary disease, unspecified: Secondary | ICD-10-CM | POA: Diagnosis not present

## 2017-10-28 DIAGNOSIS — C679 Malignant neoplasm of bladder, unspecified: Secondary | ICD-10-CM | POA: Diagnosis not present

## 2017-10-28 DIAGNOSIS — C499 Malignant neoplasm of connective and soft tissue, unspecified: Secondary | ICD-10-CM | POA: Diagnosis not present

## 2017-10-28 DIAGNOSIS — J9611 Chronic respiratory failure with hypoxia: Secondary | ICD-10-CM | POA: Diagnosis not present

## 2017-10-28 DIAGNOSIS — J9612 Chronic respiratory failure with hypercapnia: Secondary | ICD-10-CM | POA: Diagnosis not present

## 2017-10-28 DIAGNOSIS — M150 Primary generalized (osteo)arthritis: Secondary | ICD-10-CM | POA: Diagnosis not present

## 2017-10-31 DIAGNOSIS — J9612 Chronic respiratory failure with hypercapnia: Secondary | ICD-10-CM | POA: Diagnosis not present

## 2017-10-31 DIAGNOSIS — M150 Primary generalized (osteo)arthritis: Secondary | ICD-10-CM | POA: Diagnosis not present

## 2017-10-31 DIAGNOSIS — C499 Malignant neoplasm of connective and soft tissue, unspecified: Secondary | ICD-10-CM | POA: Diagnosis not present

## 2017-10-31 DIAGNOSIS — C679 Malignant neoplasm of bladder, unspecified: Secondary | ICD-10-CM | POA: Diagnosis not present

## 2017-10-31 DIAGNOSIS — J9611 Chronic respiratory failure with hypoxia: Secondary | ICD-10-CM | POA: Diagnosis not present

## 2017-10-31 DIAGNOSIS — J449 Chronic obstructive pulmonary disease, unspecified: Secondary | ICD-10-CM | POA: Diagnosis not present

## 2017-11-02 DIAGNOSIS — C499 Malignant neoplasm of connective and soft tissue, unspecified: Secondary | ICD-10-CM | POA: Diagnosis not present

## 2017-11-02 DIAGNOSIS — J9611 Chronic respiratory failure with hypoxia: Secondary | ICD-10-CM | POA: Diagnosis not present

## 2017-11-02 DIAGNOSIS — J9612 Chronic respiratory failure with hypercapnia: Secondary | ICD-10-CM | POA: Diagnosis not present

## 2017-11-02 DIAGNOSIS — M150 Primary generalized (osteo)arthritis: Secondary | ICD-10-CM | POA: Diagnosis not present

## 2017-11-02 DIAGNOSIS — J449 Chronic obstructive pulmonary disease, unspecified: Secondary | ICD-10-CM | POA: Diagnosis not present

## 2017-11-02 DIAGNOSIS — C679 Malignant neoplasm of bladder, unspecified: Secondary | ICD-10-CM | POA: Diagnosis not present

## 2017-11-04 DIAGNOSIS — C499 Malignant neoplasm of connective and soft tissue, unspecified: Secondary | ICD-10-CM | POA: Diagnosis not present

## 2017-11-04 DIAGNOSIS — J449 Chronic obstructive pulmonary disease, unspecified: Secondary | ICD-10-CM | POA: Diagnosis not present

## 2017-11-04 DIAGNOSIS — M150 Primary generalized (osteo)arthritis: Secondary | ICD-10-CM | POA: Diagnosis not present

## 2017-11-04 DIAGNOSIS — J9612 Chronic respiratory failure with hypercapnia: Secondary | ICD-10-CM | POA: Diagnosis not present

## 2017-11-04 DIAGNOSIS — J9611 Chronic respiratory failure with hypoxia: Secondary | ICD-10-CM | POA: Diagnosis not present

## 2017-11-04 DIAGNOSIS — C679 Malignant neoplasm of bladder, unspecified: Secondary | ICD-10-CM | POA: Diagnosis not present

## 2017-11-10 DIAGNOSIS — C679 Malignant neoplasm of bladder, unspecified: Secondary | ICD-10-CM | POA: Diagnosis not present

## 2017-11-10 DIAGNOSIS — C499 Malignant neoplasm of connective and soft tissue, unspecified: Secondary | ICD-10-CM | POA: Diagnosis not present

## 2017-11-10 DIAGNOSIS — J9612 Chronic respiratory failure with hypercapnia: Secondary | ICD-10-CM | POA: Diagnosis not present

## 2017-11-10 DIAGNOSIS — J9611 Chronic respiratory failure with hypoxia: Secondary | ICD-10-CM | POA: Diagnosis not present

## 2017-11-10 DIAGNOSIS — M150 Primary generalized (osteo)arthritis: Secondary | ICD-10-CM | POA: Diagnosis not present

## 2017-11-10 DIAGNOSIS — J449 Chronic obstructive pulmonary disease, unspecified: Secondary | ICD-10-CM | POA: Diagnosis not present

## 2017-11-11 DIAGNOSIS — C499 Malignant neoplasm of connective and soft tissue, unspecified: Secondary | ICD-10-CM | POA: Diagnosis not present

## 2017-11-11 DIAGNOSIS — J9612 Chronic respiratory failure with hypercapnia: Secondary | ICD-10-CM | POA: Diagnosis not present

## 2017-11-11 DIAGNOSIS — J449 Chronic obstructive pulmonary disease, unspecified: Secondary | ICD-10-CM | POA: Diagnosis not present

## 2017-11-11 DIAGNOSIS — J9611 Chronic respiratory failure with hypoxia: Secondary | ICD-10-CM | POA: Diagnosis not present

## 2017-11-11 DIAGNOSIS — M150 Primary generalized (osteo)arthritis: Secondary | ICD-10-CM | POA: Diagnosis not present

## 2017-11-11 DIAGNOSIS — C679 Malignant neoplasm of bladder, unspecified: Secondary | ICD-10-CM | POA: Diagnosis not present

## 2017-11-16 DIAGNOSIS — J9612 Chronic respiratory failure with hypercapnia: Secondary | ICD-10-CM | POA: Diagnosis not present

## 2017-11-16 DIAGNOSIS — M150 Primary generalized (osteo)arthritis: Secondary | ICD-10-CM | POA: Diagnosis not present

## 2017-11-16 DIAGNOSIS — J9611 Chronic respiratory failure with hypoxia: Secondary | ICD-10-CM | POA: Diagnosis not present

## 2017-11-16 DIAGNOSIS — C679 Malignant neoplasm of bladder, unspecified: Secondary | ICD-10-CM | POA: Diagnosis not present

## 2017-11-16 DIAGNOSIS — J449 Chronic obstructive pulmonary disease, unspecified: Secondary | ICD-10-CM | POA: Diagnosis not present

## 2017-11-16 DIAGNOSIS — C499 Malignant neoplasm of connective and soft tissue, unspecified: Secondary | ICD-10-CM | POA: Diagnosis not present

## 2017-11-18 DIAGNOSIS — M150 Primary generalized (osteo)arthritis: Secondary | ICD-10-CM | POA: Diagnosis not present

## 2017-11-18 DIAGNOSIS — J9612 Chronic respiratory failure with hypercapnia: Secondary | ICD-10-CM | POA: Diagnosis not present

## 2017-11-18 DIAGNOSIS — J449 Chronic obstructive pulmonary disease, unspecified: Secondary | ICD-10-CM | POA: Diagnosis not present

## 2017-11-18 DIAGNOSIS — C679 Malignant neoplasm of bladder, unspecified: Secondary | ICD-10-CM | POA: Diagnosis not present

## 2017-11-18 DIAGNOSIS — C499 Malignant neoplasm of connective and soft tissue, unspecified: Secondary | ICD-10-CM | POA: Diagnosis not present

## 2017-11-18 DIAGNOSIS — J9611 Chronic respiratory failure with hypoxia: Secondary | ICD-10-CM | POA: Diagnosis not present

## 2017-11-21 DIAGNOSIS — M150 Primary generalized (osteo)arthritis: Secondary | ICD-10-CM | POA: Diagnosis not present

## 2017-11-21 DIAGNOSIS — C499 Malignant neoplasm of connective and soft tissue, unspecified: Secondary | ICD-10-CM | POA: Diagnosis not present

## 2017-11-21 DIAGNOSIS — C679 Malignant neoplasm of bladder, unspecified: Secondary | ICD-10-CM | POA: Diagnosis not present

## 2017-11-21 DIAGNOSIS — J9611 Chronic respiratory failure with hypoxia: Secondary | ICD-10-CM | POA: Diagnosis not present

## 2017-11-21 DIAGNOSIS — J9612 Chronic respiratory failure with hypercapnia: Secondary | ICD-10-CM | POA: Diagnosis not present

## 2017-11-21 DIAGNOSIS — J449 Chronic obstructive pulmonary disease, unspecified: Secondary | ICD-10-CM | POA: Diagnosis not present

## 2017-11-24 DIAGNOSIS — M150 Primary generalized (osteo)arthritis: Secondary | ICD-10-CM | POA: Diagnosis not present

## 2017-11-24 DIAGNOSIS — J449 Chronic obstructive pulmonary disease, unspecified: Secondary | ICD-10-CM | POA: Diagnosis not present

## 2017-11-24 DIAGNOSIS — J9611 Chronic respiratory failure with hypoxia: Secondary | ICD-10-CM | POA: Diagnosis not present

## 2017-11-24 DIAGNOSIS — J9612 Chronic respiratory failure with hypercapnia: Secondary | ICD-10-CM | POA: Diagnosis not present

## 2017-11-24 DIAGNOSIS — C679 Malignant neoplasm of bladder, unspecified: Secondary | ICD-10-CM | POA: Diagnosis not present

## 2017-11-24 DIAGNOSIS — C499 Malignant neoplasm of connective and soft tissue, unspecified: Secondary | ICD-10-CM | POA: Diagnosis not present

## 2017-11-30 DIAGNOSIS — J9611 Chronic respiratory failure with hypoxia: Secondary | ICD-10-CM | POA: Diagnosis not present

## 2017-11-30 DIAGNOSIS — J449 Chronic obstructive pulmonary disease, unspecified: Secondary | ICD-10-CM | POA: Diagnosis not present

## 2017-11-30 DIAGNOSIS — J9612 Chronic respiratory failure with hypercapnia: Secondary | ICD-10-CM | POA: Diagnosis not present

## 2017-11-30 DIAGNOSIS — C499 Malignant neoplasm of connective and soft tissue, unspecified: Secondary | ICD-10-CM | POA: Diagnosis not present

## 2017-11-30 DIAGNOSIS — M150 Primary generalized (osteo)arthritis: Secondary | ICD-10-CM | POA: Diagnosis not present

## 2017-11-30 DIAGNOSIS — C679 Malignant neoplasm of bladder, unspecified: Secondary | ICD-10-CM | POA: Diagnosis not present

## 2017-12-02 DIAGNOSIS — J9612 Chronic respiratory failure with hypercapnia: Secondary | ICD-10-CM | POA: Diagnosis not present

## 2017-12-02 DIAGNOSIS — C499 Malignant neoplasm of connective and soft tissue, unspecified: Secondary | ICD-10-CM | POA: Diagnosis not present

## 2017-12-02 DIAGNOSIS — C679 Malignant neoplasm of bladder, unspecified: Secondary | ICD-10-CM | POA: Diagnosis not present

## 2017-12-02 DIAGNOSIS — M150 Primary generalized (osteo)arthritis: Secondary | ICD-10-CM | POA: Diagnosis not present

## 2017-12-02 DIAGNOSIS — J449 Chronic obstructive pulmonary disease, unspecified: Secondary | ICD-10-CM | POA: Diagnosis not present

## 2017-12-02 DIAGNOSIS — J9611 Chronic respiratory failure with hypoxia: Secondary | ICD-10-CM | POA: Diagnosis not present

## 2017-12-05 ENCOUNTER — Telehealth: Payer: Self-pay | Admitting: Pulmonary Disease

## 2017-12-05 DIAGNOSIS — I272 Pulmonary hypertension, unspecified: Secondary | ICD-10-CM

## 2017-12-05 NOTE — Telephone Encounter (Signed)
Pt called and given verbal order for physical therapy twice a week for the next 9 weeks. Order placed in [place] as well.

## 2017-12-05 NOTE — Telephone Encounter (Signed)
Spoke with [name].   She is requesting a verbal order for the patient to have PT twice a week for the next 9 weeks.   SN, please advise if you ok with a verbal. Thanks!

## 2017-12-06 DIAGNOSIS — C499 Malignant neoplasm of connective and soft tissue, unspecified: Secondary | ICD-10-CM | POA: Diagnosis not present

## 2017-12-06 DIAGNOSIS — M150 Primary generalized (osteo)arthritis: Secondary | ICD-10-CM | POA: Diagnosis not present

## 2017-12-06 DIAGNOSIS — J9611 Chronic respiratory failure with hypoxia: Secondary | ICD-10-CM | POA: Diagnosis not present

## 2017-12-06 DIAGNOSIS — C679 Malignant neoplasm of bladder, unspecified: Secondary | ICD-10-CM | POA: Diagnosis not present

## 2017-12-06 DIAGNOSIS — J9612 Chronic respiratory failure with hypercapnia: Secondary | ICD-10-CM | POA: Diagnosis not present

## 2017-12-06 DIAGNOSIS — J449 Chronic obstructive pulmonary disease, unspecified: Secondary | ICD-10-CM | POA: Diagnosis not present

## 2017-12-08 DIAGNOSIS — C499 Malignant neoplasm of connective and soft tissue, unspecified: Secondary | ICD-10-CM | POA: Diagnosis not present

## 2017-12-08 DIAGNOSIS — C679 Malignant neoplasm of bladder, unspecified: Secondary | ICD-10-CM | POA: Diagnosis not present

## 2017-12-08 DIAGNOSIS — J9612 Chronic respiratory failure with hypercapnia: Secondary | ICD-10-CM | POA: Diagnosis not present

## 2017-12-08 DIAGNOSIS — M150 Primary generalized (osteo)arthritis: Secondary | ICD-10-CM | POA: Diagnosis not present

## 2017-12-08 DIAGNOSIS — J9611 Chronic respiratory failure with hypoxia: Secondary | ICD-10-CM | POA: Diagnosis not present

## 2017-12-08 DIAGNOSIS — J449 Chronic obstructive pulmonary disease, unspecified: Secondary | ICD-10-CM | POA: Diagnosis not present

## 2017-12-10 DIAGNOSIS — J9612 Chronic respiratory failure with hypercapnia: Secondary | ICD-10-CM | POA: Diagnosis not present

## 2017-12-10 DIAGNOSIS — C499 Malignant neoplasm of connective and soft tissue, unspecified: Secondary | ICD-10-CM | POA: Diagnosis not present

## 2017-12-10 DIAGNOSIS — J449 Chronic obstructive pulmonary disease, unspecified: Secondary | ICD-10-CM | POA: Diagnosis not present

## 2017-12-10 DIAGNOSIS — J9611 Chronic respiratory failure with hypoxia: Secondary | ICD-10-CM | POA: Diagnosis not present

## 2017-12-10 DIAGNOSIS — M150 Primary generalized (osteo)arthritis: Secondary | ICD-10-CM | POA: Diagnosis not present

## 2017-12-10 DIAGNOSIS — C679 Malignant neoplasm of bladder, unspecified: Secondary | ICD-10-CM | POA: Diagnosis not present

## 2017-12-13 DIAGNOSIS — M150 Primary generalized (osteo)arthritis: Secondary | ICD-10-CM | POA: Diagnosis not present

## 2017-12-13 DIAGNOSIS — J9611 Chronic respiratory failure with hypoxia: Secondary | ICD-10-CM | POA: Diagnosis not present

## 2017-12-13 DIAGNOSIS — C499 Malignant neoplasm of connective and soft tissue, unspecified: Secondary | ICD-10-CM | POA: Diagnosis not present

## 2017-12-13 DIAGNOSIS — J449 Chronic obstructive pulmonary disease, unspecified: Secondary | ICD-10-CM | POA: Diagnosis not present

## 2017-12-13 DIAGNOSIS — C679 Malignant neoplasm of bladder, unspecified: Secondary | ICD-10-CM | POA: Diagnosis not present

## 2017-12-13 DIAGNOSIS — J9612 Chronic respiratory failure with hypercapnia: Secondary | ICD-10-CM | POA: Diagnosis not present

## 2017-12-15 DIAGNOSIS — J9611 Chronic respiratory failure with hypoxia: Secondary | ICD-10-CM | POA: Diagnosis not present

## 2017-12-15 DIAGNOSIS — J9612 Chronic respiratory failure with hypercapnia: Secondary | ICD-10-CM | POA: Diagnosis not present

## 2017-12-15 DIAGNOSIS — M150 Primary generalized (osteo)arthritis: Secondary | ICD-10-CM | POA: Diagnosis not present

## 2017-12-15 DIAGNOSIS — C679 Malignant neoplasm of bladder, unspecified: Secondary | ICD-10-CM | POA: Diagnosis not present

## 2017-12-15 DIAGNOSIS — C499 Malignant neoplasm of connective and soft tissue, unspecified: Secondary | ICD-10-CM | POA: Diagnosis not present

## 2017-12-15 DIAGNOSIS — J449 Chronic obstructive pulmonary disease, unspecified: Secondary | ICD-10-CM | POA: Diagnosis not present

## 2017-12-19 DIAGNOSIS — C499 Malignant neoplasm of connective and soft tissue, unspecified: Secondary | ICD-10-CM | POA: Diagnosis not present

## 2017-12-19 DIAGNOSIS — J449 Chronic obstructive pulmonary disease, unspecified: Secondary | ICD-10-CM | POA: Diagnosis not present

## 2017-12-19 DIAGNOSIS — M150 Primary generalized (osteo)arthritis: Secondary | ICD-10-CM | POA: Diagnosis not present

## 2017-12-19 DIAGNOSIS — J9611 Chronic respiratory failure with hypoxia: Secondary | ICD-10-CM | POA: Diagnosis not present

## 2017-12-19 DIAGNOSIS — J9612 Chronic respiratory failure with hypercapnia: Secondary | ICD-10-CM | POA: Diagnosis not present

## 2017-12-19 DIAGNOSIS — C679 Malignant neoplasm of bladder, unspecified: Secondary | ICD-10-CM | POA: Diagnosis not present

## 2017-12-22 DIAGNOSIS — M150 Primary generalized (osteo)arthritis: Secondary | ICD-10-CM | POA: Diagnosis not present

## 2017-12-22 DIAGNOSIS — C499 Malignant neoplasm of connective and soft tissue, unspecified: Secondary | ICD-10-CM | POA: Diagnosis not present

## 2017-12-22 DIAGNOSIS — C679 Malignant neoplasm of bladder, unspecified: Secondary | ICD-10-CM | POA: Diagnosis not present

## 2017-12-22 DIAGNOSIS — J9612 Chronic respiratory failure with hypercapnia: Secondary | ICD-10-CM | POA: Diagnosis not present

## 2017-12-22 DIAGNOSIS — J9611 Chronic respiratory failure with hypoxia: Secondary | ICD-10-CM | POA: Diagnosis not present

## 2017-12-22 DIAGNOSIS — J449 Chronic obstructive pulmonary disease, unspecified: Secondary | ICD-10-CM | POA: Diagnosis not present

## 2017-12-27 DIAGNOSIS — R918 Other nonspecific abnormal finding of lung field: Secondary | ICD-10-CM | POA: Diagnosis not present

## 2017-12-27 DIAGNOSIS — E279 Disorder of adrenal gland, unspecified: Secondary | ICD-10-CM | POA: Diagnosis not present

## 2017-12-27 DIAGNOSIS — C494 Malignant neoplasm of connective and soft tissue of abdomen: Secondary | ICD-10-CM | POA: Diagnosis not present

## 2017-12-27 DIAGNOSIS — K7689 Other specified diseases of liver: Secondary | ICD-10-CM | POA: Diagnosis not present

## 2017-12-27 DIAGNOSIS — C499 Malignant neoplasm of connective and soft tissue, unspecified: Secondary | ICD-10-CM | POA: Diagnosis not present

## 2017-12-27 DIAGNOSIS — E278 Other specified disorders of adrenal gland: Secondary | ICD-10-CM | POA: Diagnosis not present

## 2017-12-28 DIAGNOSIS — J9612 Chronic respiratory failure with hypercapnia: Secondary | ICD-10-CM | POA: Diagnosis not present

## 2017-12-28 DIAGNOSIS — J449 Chronic obstructive pulmonary disease, unspecified: Secondary | ICD-10-CM | POA: Diagnosis not present

## 2017-12-28 DIAGNOSIS — M150 Primary generalized (osteo)arthritis: Secondary | ICD-10-CM | POA: Diagnosis not present

## 2017-12-28 DIAGNOSIS — J9611 Chronic respiratory failure with hypoxia: Secondary | ICD-10-CM | POA: Diagnosis not present

## 2017-12-28 DIAGNOSIS — C679 Malignant neoplasm of bladder, unspecified: Secondary | ICD-10-CM | POA: Diagnosis not present

## 2017-12-28 DIAGNOSIS — C499 Malignant neoplasm of connective and soft tissue, unspecified: Secondary | ICD-10-CM | POA: Diagnosis not present

## 2017-12-30 DIAGNOSIS — J449 Chronic obstructive pulmonary disease, unspecified: Secondary | ICD-10-CM | POA: Diagnosis not present

## 2017-12-30 DIAGNOSIS — J9611 Chronic respiratory failure with hypoxia: Secondary | ICD-10-CM | POA: Diagnosis not present

## 2017-12-30 DIAGNOSIS — C499 Malignant neoplasm of connective and soft tissue, unspecified: Secondary | ICD-10-CM | POA: Diagnosis not present

## 2017-12-30 DIAGNOSIS — J9612 Chronic respiratory failure with hypercapnia: Secondary | ICD-10-CM | POA: Diagnosis not present

## 2017-12-30 DIAGNOSIS — M150 Primary generalized (osteo)arthritis: Secondary | ICD-10-CM | POA: Diagnosis not present

## 2017-12-30 DIAGNOSIS — C679 Malignant neoplasm of bladder, unspecified: Secondary | ICD-10-CM | POA: Diagnosis not present

## 2018-01-03 DIAGNOSIS — C499 Malignant neoplasm of connective and soft tissue, unspecified: Secondary | ICD-10-CM | POA: Diagnosis not present

## 2018-01-03 DIAGNOSIS — J9611 Chronic respiratory failure with hypoxia: Secondary | ICD-10-CM | POA: Diagnosis not present

## 2018-01-03 DIAGNOSIS — M150 Primary generalized (osteo)arthritis: Secondary | ICD-10-CM | POA: Diagnosis not present

## 2018-01-03 DIAGNOSIS — J449 Chronic obstructive pulmonary disease, unspecified: Secondary | ICD-10-CM | POA: Diagnosis not present

## 2018-01-03 DIAGNOSIS — J9612 Chronic respiratory failure with hypercapnia: Secondary | ICD-10-CM | POA: Diagnosis not present

## 2018-01-03 DIAGNOSIS — C679 Malignant neoplasm of bladder, unspecified: Secondary | ICD-10-CM | POA: Diagnosis not present

## 2018-01-05 DIAGNOSIS — C679 Malignant neoplasm of bladder, unspecified: Secondary | ICD-10-CM | POA: Diagnosis not present

## 2018-01-05 DIAGNOSIS — M150 Primary generalized (osteo)arthritis: Secondary | ICD-10-CM | POA: Diagnosis not present

## 2018-01-05 DIAGNOSIS — J9612 Chronic respiratory failure with hypercapnia: Secondary | ICD-10-CM | POA: Diagnosis not present

## 2018-01-05 DIAGNOSIS — C499 Malignant neoplasm of connective and soft tissue, unspecified: Secondary | ICD-10-CM | POA: Diagnosis not present

## 2018-01-05 DIAGNOSIS — J9611 Chronic respiratory failure with hypoxia: Secondary | ICD-10-CM | POA: Diagnosis not present

## 2018-01-05 DIAGNOSIS — J449 Chronic obstructive pulmonary disease, unspecified: Secondary | ICD-10-CM | POA: Diagnosis not present

## 2018-01-10 ENCOUNTER — Encounter: Payer: Self-pay | Admitting: Pulmonary Disease

## 2018-01-10 ENCOUNTER — Ambulatory Visit (INDEPENDENT_AMBULATORY_CARE_PROVIDER_SITE_OTHER): Payer: Medicare Other | Admitting: Pulmonary Disease

## 2018-01-10 VITALS — BP 122/62 | HR 84 | Temp 98.2°F | Ht 66.0 in | Wt 232.4 lb

## 2018-01-10 DIAGNOSIS — I2510 Atherosclerotic heart disease of native coronary artery without angina pectoris: Secondary | ICD-10-CM | POA: Diagnosis not present

## 2018-01-10 DIAGNOSIS — J9622 Acute and chronic respiratory failure with hypercapnia: Secondary | ICD-10-CM | POA: Diagnosis not present

## 2018-01-10 DIAGNOSIS — I420 Dilated cardiomyopathy: Secondary | ICD-10-CM | POA: Diagnosis not present

## 2018-01-10 DIAGNOSIS — J9621 Acute and chronic respiratory failure with hypoxia: Secondary | ICD-10-CM

## 2018-01-10 DIAGNOSIS — M159 Polyosteoarthritis, unspecified: Secondary | ICD-10-CM

## 2018-01-10 DIAGNOSIS — Z87891 Personal history of nicotine dependence: Secondary | ICD-10-CM

## 2018-01-10 DIAGNOSIS — J449 Chronic obstructive pulmonary disease, unspecified: Secondary | ICD-10-CM | POA: Diagnosis not present

## 2018-01-10 DIAGNOSIS — Z6837 Body mass index (BMI) 37.0-37.9, adult: Secondary | ICD-10-CM

## 2018-01-10 DIAGNOSIS — I10 Essential (primary) hypertension: Secondary | ICD-10-CM

## 2018-01-10 DIAGNOSIS — E669 Obesity, unspecified: Secondary | ICD-10-CM | POA: Insufficient documentation

## 2018-01-10 DIAGNOSIS — M150 Primary generalized (osteo)arthritis: Secondary | ICD-10-CM | POA: Diagnosis not present

## 2018-01-10 DIAGNOSIS — C499 Malignant neoplasm of connective and soft tissue, unspecified: Secondary | ICD-10-CM | POA: Diagnosis not present

## 2018-01-10 DIAGNOSIS — Z9989 Dependence on other enabling machines and devices: Secondary | ICD-10-CM

## 2018-01-10 DIAGNOSIS — I272 Pulmonary hypertension, unspecified: Secondary | ICD-10-CM | POA: Diagnosis not present

## 2018-01-10 DIAGNOSIS — E6609 Other obesity due to excess calories: Secondary | ICD-10-CM | POA: Diagnosis not present

## 2018-01-10 MED ORDER — IPRATROPIUM-ALBUTEROL 0.5-2.5 (3) MG/3ML IN SOLN: 3.0000 mL | Freq: Four times a day (QID) | RESPIRATORY_TRACT | 11 refills | Status: DC

## 2018-01-10 MED ORDER — BUDESONIDE 0.25 MG/2ML IN SUSP: 0.2500 mg | Freq: Two times a day (BID) | RESPIRATORY_TRACT | 11 refills | Status: DC

## 2018-01-10 NOTE — Patient Instructions (Signed)
Today we updated your med list in our EPIC system...    Continue your current medications the same...  We sent refill requests for your DUONEB & BUDESONIDE to [place]...  Continue with the recommendations from [place] at [facility] regarding follow up & repeat scans, etc...  Call for any questions or if we can be of service in any way...  Let's plan a follow up visit in 38mo, sooner if needed for problems.Marland KitchenMarland Kitchen

## 2018-01-11 ENCOUNTER — Encounter: Payer: Self-pay | Admitting: Pulmonary Disease

## 2018-01-11 DIAGNOSIS — C679 Malignant neoplasm of bladder, unspecified: Secondary | ICD-10-CM | POA: Diagnosis not present

## 2018-01-11 DIAGNOSIS — C499 Malignant neoplasm of connective and soft tissue, unspecified: Secondary | ICD-10-CM | POA: Diagnosis not present

## 2018-01-11 DIAGNOSIS — J9612 Chronic respiratory failure with hypercapnia: Secondary | ICD-10-CM | POA: Diagnosis not present

## 2018-01-11 DIAGNOSIS — J9611 Chronic respiratory failure with hypoxia: Secondary | ICD-10-CM | POA: Diagnosis not present

## 2018-01-11 DIAGNOSIS — M150 Primary generalized (osteo)arthritis: Secondary | ICD-10-CM | POA: Diagnosis not present

## 2018-01-11 DIAGNOSIS — J449 Chronic obstructive pulmonary disease, unspecified: Secondary | ICD-10-CM | POA: Diagnosis not present

## 2018-01-11 NOTE — Progress Notes (Signed)
Subjective:    Patient ID: SYLVANUS TELFORD, male    DOB: 13-Aug-1946, 72 y.o.   MRN: [id number]  HPI 72 y/o WM here for a follow up visit and on-going management of mult med problems...  ~  SEE PREV EPIC NOTES FOR OLDER DATA >>    CXR 7/14 showed COPD/ emphysema, incr markings and scarring at the bases, NAD...  LABS 7/14:  FLP- ok on Simva20;  Chems- ok x BS=120 A1c=6.7;  CBC- wnl;  TSH=1.26;  PSA=0.41...  [name] has established Primary Care follow up w/ [name] at [facility]>>   CXR 7/15 showed underlying COPD, mild bibasilar fibrosis, norm heart size, DJD in spine, NAD...   PFT 7/15 showed> FVC=3.18 (78%), FEV1=1.22 (38%), %1sec=38; mid-flows=13% predicted... This is c/w GOLD Stage3 COPD and we discussed this...  ~  December 05, 2014:  432moROV & pulmonary recheck; EEtiennehas continued smoking, perhaps decr slightly to 1/2ppd but unable to quit & he has gained 6# in the interim up to 221# today;  His CC is sinusitis w/ nasal congestion, drainage & green mucus plus maxillary/ frontal HAs; he denies f/c/s, notes stable cough & chest symptoms w/ DOE;  He has been taking his Advair250Bid but prev refused addition of an anticholinergic due to "the donut hole";  States his breathing is "fine, except in cold air" ( we discussed wearing a scarf in winter etc)... He has GOLD Stage3 COPD w/ FEV1=1.22 (38%predicted) done 7/15;;  Exam shows decr BS bilat w/ few scat rhonchi & mild exp wheezing esp w/ forced maneuver...     We reviewed prob list, meds, xrays and labs> see below for updates >> [facility]... PLAN>> we discussed treating his acute max sinusitis w/ Augmentin875Bid, plus align daily & nasal saline as needed; he knows the importance of smoking cessation but is not motivated & declines smoking cessation help; he will continue the Advair250Bid 7 we are adding spiriva via handihaler once per day; we plan ROV in 663mo/ f/u CXR & PFT,  call in the interim for any breathing issues....   ~  June 05, 2015:  32m52moV & ErnJovontaeports a good interval, no new complaints or concerns; still smoking +/-1ppd and blames family stress; notes cough, sm amy yellow sput, no hemoptysis, SOB w/o change/stable- still mows 3 yards, does chores, tends to 20+chickens & their eggs, etc; he has repeatedly declined smoking cessation help, clearly not motivated to quit, not even doing his inhalers regularly!!!    COPD, +smoker> on Advair250Bid & Spiriva daily; symptoms as above, he is stoic, still smoking 1ppd & NOT motivated to quit, asked to cut back & use both inhalers regularly...    HBP> on Metop25Bid, Amlod10, Lisin40; BP=114/64 & he denies CP, palpit, SOB, edema...    CAD> on ASA81; followed by [name] & seen 4/15 (overdue for f/u)> CAD, IWMI 1995, mult PCIs, last Myoview 2013 was low risk; too sedentary, no changes made; 73 50o brother died w/ AAA rupture...    Chol> on Simva20; FLP 7/14 showed TChol 158, TG 145, HDL 33, LDL 96; needs better diet & wt reduction and ret for Fasting blood work overdue.    DM> on diet alone; LABS 7/14 showed BS= 120, A1c= 6.7; we reviewed low carb diet, exercise program, and wt reduction...    Obese> wt recorded as 204# today but it doesn't look like he's lost 26#; we reviewed diet &  exercise...    GI- Gastritis, polyps> prev on Zantac; prev colon 2004 w/ hyperplastic polyp removed & f/u 8/14 by DrStark w/ 3 polyps removed (5-60m size, all were tubular adenomas), +divertics in sigmoid, f/u planned 5 yrs... NOTE> exam showed a small palp knot in mid-abd above the umbilicus ?etiology (I offered CT Abd but he declines & wants to discuss w/ his primary- [name]).    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    DJD> on OTC analgesics  as needed...    Anxiety> on Alpraz0.18m57mrn... We reviewed prob list, meds, xrays and labs> see below for updates >>  NOTE> HE IS FOLLOWED BY [name] for GEN MED/ PCP now...  CXR 06/05/15 showed stable heart size, Ao atherosclerosis & tortuousiy, COPD/emphysema, incr markings at the bases, arthritis in Tspine...  LABS 7/16: pt asked to ret for fasting blood work... IMP/PLAN>>  [name] is stable but continues to smoke 1ppd; CXR w/ COPD/emphysema, atherosclerotic Ao, chronic changes but NAD- we discussed the low-dose screening CT Chest for the early detection of lung cancer (he is a candidate & he will consider participating in this program); in the meanwhile- continue Advair/ Spiriva regularly;  He is overdue for Cards f/u w/ [name], and needs to continue Q6mo29mo w/ [name] (w/ copy of notes to us);KoreaHe will ret for FASTING blood work.  ADDENDUM>> given his age and smoking history, [name] candidate for LUNG CANCER SCREENING w/ LOW-DOSE CT Chest>>  This office visit constitutes a face-to-face visit for the purpose of lung cancer screening counseling and a shared decision making visit...  Eligibility for LDCT screening:  1) Age=70 (must be 55-77y/o);  2) Absence of symptoms of lung cancer: he denies CP, ch in SOB, hemoptysis.  Smoking History:  1) He has 60+ pk-yrs (must have >30 pk-yr hx smoking);  2) Smoking status- still smoking ~1ppd (current or former smoker who quit<15 yrs ago).  Counseling:  Importance of smoking cessation &/or continued abstinence discussed ; offered smoking cessation interventions- pt declined; pt agreed to continued LDCT screening annually.  Shared decision making:  We reviewed the potential benefits and harms of screening> eg. Early detection of lung cancer and improved survival, the need for additional diagnostic tests & possible surgery, the risk of over-diagnosis/ false positives/ and radiation exposure... All questions were answered to the best  of my ability & he would like to consider the LDCT screening & he will let us kKoreaw his decision=> he ignored this discussion & never called.  ~  October 31, 2015:  5 month ROV & post hospital follow up visit>  [name] was Adm 12/5 - 10/29/15 by [facility] w/ acute hypoxemic resp failure precipitated by a COPD exac; for his part he noted incr SOB, cough, green sput, & "slowing down", he denied f/c/s, no CP, etc;  In the ER he was hypoxemic w/ O2sats in the 70s on RA and eval revealed> CXR- COPD, bullous dis, incr markings at bases, NAD; Cultures of blood/ sput/ urine were all neg; Serologies for strep & legionella were neg; Viral panel was neg;  LABs were OK (WBC=7.9=>14, Hg=15.3=>13.9, BS=100-140, BNP=498);  EKG showed NSR, rate78, NSSTTWA;  2DEcho 10/21/15 showed mildLVH, norm LVF w/ EF=55-60%, no regional wall motion abn, LA-mod  dil, RA- mod to severe dil, RV- mod dil, mod TR, PAsys-70mHg... He was treated w/ Oxygen, IV Solumedrol, Rocephin/ Zithromax, NEBS, Lovenox, flutter, etc; he was disch on Home O2- 2L/min activ & 1L/min rest, Pred taper, Levaquin, Advair250/ Spiriva...  He returns today feeling better, notes his home pulse-ox checks are improving & he has less SOB/DOE, less cough/sput, etc;  He says he has not smoked since PTA...      EXAM shows Afeb, VSS, O2sat=94% on 2L/min [place];  Wt=210# (he was 204 prev);  HEENT- neg, mallampati1;  Chest- decr BS bilat, few scat rhonchi, no w/r/consolidation;  Heart- RR w/o m/r/g;  Abd- obese, soft;  Ext- neg w/o c/c/e...  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    ~  November 21, 2015:  3wk ROV & MrWicker reports that he feels much better, SOB improved, cough improved, still prod of a sm amt green sput w/o blood w/o f/c/s;  He's been using the NEB w/ Albut TID followed by his AdvairBid & SpirivaQd; he is off the Pred now, he is too sedentary & not exercising => he needs PULM REHAB & we will refer, [facility] we will inquire as to what he might be elig for...       EXAM shows Afeb, VSS, O2sat=98% on 2L/min [place];  Wt=216# (he was 204-210# prev);  HEENT- neg, mallampati2;  Chest- decr BS bilat, otherw clear w/o w/r/r;  Heart- RR w/o m/r/g;  Abd- obese, soft;  Ext- neg w/o c/c/e...  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 We plan ROV in 6 wks.  ~  February 03, 2016:  2-376moOV & ErSharman Crateeturns w/ his severe obstructive lung dis, GOLD Stage 3, chr hypoxemic resp failure w/ pulmHTN (WHO group3); he quit smoking 10/2015 & is using home O2 3L/min, NEBS w/ Albut tid, Advair250Bid, Spiriva daily, Mucinex etc... He reports that breathing is stable overall but he is c/o 51m86mo URI w/ cough, thick yellow sput hard to produce, incr SOB 7 chest tightness but no fever, no CP=> he wants another antibiotic... He spent much time ventilating about his problems w/ DME-[facility], his O2 set-up, & [facility] program- they still haven't contacted him about starting Rehab... He does have mult entries into EPIC from [employer] reviewed...     EXAM shows Afeb, VSS, O2sat=92% on 3L/min [place];  Wt=231# (up 15# & he was 204-210# prev);  HEENT- neg, mallampati2;  Chest- decr BS bilat, otherw clear w/o w/r/r;  Heart- RR w/o m/r/g;  Abd- obese, soft;  Ext- neg w/o c/c/e.  PFT 02/03/16>  FVC=2.57 (68%), FEV1=1.05 (38%), %1sec=41, mid-flows reduced at 18% predicted; post bronchodil  FEV1 w/o change;  TLC=6.67 (107%), RV=3.97 (176%), RV/TLC=60;  DLCO=22% and DL/VA=29%... This is c/w a severe obstructive ventilatory defect w/ air trapping and severely decr DLCO c/w emphysema- GOLD Stage  3 COPD... IMP/PLAN>>  We discussed continuing the NEBS Tid followed by Advair250Bid & Spiriva daily; add Mucinex 1279mBid & we will Rx w/ Levaquin500/d x7d for his bronchitic infection (see AVS);  He still needs a CT Chest but wants to wait for now;  [facility], and he wants diff DME supplier- we will inquire, plan ROV 378mo. ADDENDUM 03/18/16>> Pt unable to afford inhalers- ADVAIR & SPIRIVA discontinued and placed on NEBULIZER w/ DUONEB QID & PULMICORT 0.25 BID regularly (we will update med list to reflect this change).  ~  May 05, 2016:  54m78moV & [name] notes DOE w/ exertion- he is in PulHealth Netusing up to 6L/min w/ exercise (he tried Oxy[employer] 1 Automotiveusing 3-4L/min at rest & Qhs; he feels that he is making some progress & I reminded him the DIET + EXERCISE and wt loss would be very beneficial to his breathing; states he is walking & mowing on his off days (ie- when not in rehab); despite everything his wt continues to climb- now up 4# to 235# "exercise makes me hungry"... we reviewed the following medical problems during today's office visit >> his new PCP is [name]...    COPD, ex-smoker, chr hypoxemic RF> on NEB w/ Duoneb Qid + Budes-Bid + HFA prn (Advair & Spiriva too $$); he finally quit smoking, symptoms as above, he is now in pulm rehab but having issues w/ low sats...    HBP> on MetopER-78m51mM&1PM), Amlod10, Lisin10, Lasix40Bid; BP=136/66 & he denies CP, palpit, SOB, edema...    CAD> on ASA81, Imdur30; followed by [name] & seen 04/2016> CAD, IWMI 1995, mult PCIs, last Myoview 2013 was low risk; too sedentary, edema decr w/ Lasix; 73 y39 brother died w/ AAA rupture...    Chol> on Simva20; FLP 12/16 showed TChol 138, TG 75, HDL 25, LDL 98; needs better diet & wt  reduction and better compliance.    DM> on diet alone; LABS 5/17 showed BS= 118, A1c= per PCP; we reviewed low carb diet, exercise program, and wt reduction...    Obese> wt = 235#, 5'6"Tall, BMI=38;  we reviewed diet & exercise...    GI- Gastritis, polyps> prev on Zantac; prev colon 2004 w/ hyperplastic polyp removed & f/u 8/14 by DrStark w/ 3 polyps removed (5-7mm 39me, all were tubular adenomas), +divertics in sigmoid, f/u planned 5 yrs... NOTE> exam showed a small palp knot in mid-abd above the umbilicus ?etiology (I offered CT Abd but he declines & wants to discuss w/ his primary- [name]).    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     DJD> on OTC analgesics as needed...    Anxiety> on Alpraz0.5mg pr7m. EXAM shows Afeb, VSS, O2sat=92% on 3L/min [place];  Wt=231# (up 15# & he was 204-210# prev);  HEENT- neg, mallampati3;  Chest- decr BS bilat, otherw clear w/o w/r/r;  Heart- RR w/o m/r/g;  Abd- obese, soft;  Ext- neg w/o c/c/e. IMP/PLAN>>  [name] is rec to continue current meds regularly, get on diet & get wt down , continue in PulmRehab, etc; we wrote for a ProairHFA rescue inhaler for prn use...   ~  August 05, 2016:  54mo ROV59moast visit we rec continuing same meds but stressed DIET, EXERCISE, wt reduction-- unfortunately he  has GAINED 8# up to 243# (BMI=39) in the interval;  He states that "I don't get hungry, I only eat one sandwich & sometimes a salad at night;  He says "they kicked me out" of the [facility] program due to low oxygens even w/ use of the Oxymizer;  He says he is exercising at home now;  He has established PCP w/ [name] & apparently he did labs which pt reports were "OK".    [name] c/o cough, chest congestion, yellow sput, ans a "sinus infection" w/ yellow drainage; notes incr SOB but denies CP/  f/c/s, edema;   EXAM shows Afeb, VSS, O2sat=92% on 3L/min [place];  Wt=231# (up 15# & he was 204-210# prev);  HEENT- neg, mallampati3;  Chest- decr BS bilat, otherw clear w/o w/r/r;  Heart- RR w/o m/r/g;  Abd- obese, soft;  Ext- neg w/o c/c/e. IMP/PLAN>>  Murlin describes an upper resp infection & COPD exac=> Rx w/ Levaquin, Pred77m-4d taper, + his NEBS w/ duoneb Qid & Budes Bid; he desperately needs to get the weight down...  ~  October 13, 2016:  ETenochreturns and says "I've been worse" & indicates that he really likes the BiPAP he was placed on & disch with after his last Hosp 11/10 - 09/29/16; records reviewed by me- Adm w/ cough, yellow sput, chills and incr SOB; CXR revealed COPD, incr markings & bibasilar atx; Sput/ blood cultures and resp viral panel were all neg; LABS showed WBC~13K, anemia w/ Hg~11, Chems- ok x elev BS (his PCP is [name]); BNP was 29; ABGs showed pH=7.38, pCO2=58, pO2=79 on [place] O2; note that Bicarb level ~30;  2DEcho showed EF 60-65%, Gr1DD, PAsys~757mg;  He was felt to has a COPD exac, Cor Pulmonale- treated w/ BiPAP (he was INTOL to CPAP), given IV Solumedrol (DC on Pred taper), IV Zithromax, NEB treatments and improved...    He now returns & notes that he likes the BiPAP, feels like it is really helping (resting better, sleeps thru the night, wakes refreshed, no daytime sleepiness, & naps 2/7 days now) & wants to continue the BiPAP; NOTE that we had rec a sleep study to him on mult occas but he has always refused!  He now agrees to a sleep study, hoping that it will lead to medicare approval for his home BiPAP => HE QUALIFIES FOR BiPAP BASED ON COPD DIAGNOSIS w/ ABG SHOWING pCO2>52 ON HIS NASAL CANNULA O2, he needs the SLEEP OXIMETRY STUDY confirming O2sat<88% for cumulative >77m21mwhile on his prescribed O2, and OSA/CPAP treatment have been considered & ruled out... He has a home O2conc & a POC which he uses 3-4L pulse dose w/ his home sats in the 90s at rest & down to 88% w/  exerc...     COPD, mixed type w/ emphysema>  He has GOLD Stage3 COPD & on O2 as noted, NEBS w/ DUONEB Qid & BUDESONIDE0.25Bid, plus ProairHFA prn when out & about; off the Pred Rx [facility]...    Ex-smoker>  He quit late in 2016...    Chronic resp failure w/ hypoxemia and hypercarbia>  On above meds + ASA, Metoprolol, Imdur, Lasix    Cor pulmonale, secondary pulm hypertension>  He is on O2 24/7 using 4L/min continuous at night & 4L/min pulse dose during the day...    CARDIAC issues>  HBP, CAD w/ IWMI 1995 & mult PCIs, HL (on Simva20)- all followed by [name]...    MEDICAL issues>  HBP,  HL, DM, obesity, gastritis, divertics, colon polyps, BPH, hx bladder cancer, DJD, anxiety EXAM shows Afeb, VSS, O2sat=97% on 4L/min [place];  Wt=240#;  HEENT- neg, mallampati3;  Chest- decr BS bilat, otherw clear w/o w/r/r;  Heart- RR w/o m/r/g;  Abd- obese, soft;  Ext- neg w/o c/c/e.  CXR 09/26/16>  Norm heart size, incr bibasilar atx... IMP/PLAN>>  Arn needs to continue his O2, NEBS w/ Duoneb & Budes regularly; he wants to continue the BiPAP & we need to see if he meets Medicare's criteria for BiPAP device- proceed w/ Sleep Study...   ADDENDUM>>  Sleep Study 10/15/16>  AHI=1.8/hr (8 hypopneas & 4 RERAs;  AHI during REM = 17.6/hr;  Study done on 4LO2 & min O2sat= 88%;  Mod snoring noted;  No arrhythmias;  PLMS index= 37, but assoc arousals was only 0.2;  He appears to meet the criteria for BiPAP & we will submit to [employer]/ [employer]... Plan ROV recheck in 6-8 weeks.  ~  November 24, 2016:  6wks ROV and pulmonary follow up visit>  Vihan is stable w/ his severe disease- on BiPAP Qhs, Home O2- 24/7, NEB w/ Duoneb Qid & Budes Bid, he has rescue inhaler and [place] nasal mist;  His weight is all the way up to 257# (up 18# in 6wks) and c/o cough off & on, small amt yellow sput, no f/c/s, SOB "the same" but notes incr DOE w/ walking; unfortunately he has been very sedentary- not exercising at all  (prev in pulm rehab & they discharged him from the program)...     SEE PROBLEM LIST-- above... EXAM shows Afeb, VSS, O2sat=97% on 4L/min [place];  Wt=240#;  HEENT- neg, mallampati3;  Chest- decr BS bilat, otherw clear w/o w/r/r;  Heart- RR w/o m/r/g;  Abd- obese, soft;  Ext- neg w/o c/c/e.  LABS 11/24/16>  Chems- ok w/ BS=117, Cr=0.64, LFTs=wnl;  CBC- anemic w/ Hg=10.44mv=95, wbc-norm;  Fe=191 (sat=56%), Ferritin=109;  TSH=1.28...  Abd SONAR => ordered but never done...  Last CT Chest 08/19/16> mild cardiomeg w/ aortic & coronary atherosclerotic changes & PA enlargement w/ 3.4cm outflow tract; no adenopathy; severe bullous emphysema; bilat pulm nodules w/ 7.229mRUL nodule & 7.9 LLL lesion; Abd showssmall gallstones vs sludge & 6cm right renal cyst, mod thoracic spondylosis...  Last 2DEcho 09/29/16> mod conc LVH, norm LVF w/ EF=60-65%, no regional wall motion abn, Gr1 DD, norm valves, RV dil w/ norm sys function, PAsys=74 mmHg...  Last ONO > cannot find results IMP?PLAN>>  ErJeridas severe COPD/emphysema w/ pulmHTN & CorPulmonale; he refuses to fill inhalers due to costs & he is therefore on O2- 24/7, BiPAP nightly, NEBS w/ Duoneb Qid & Budes Bid, Mucinex 1200Bid, & asked to get into a regular exercise program;  He will continue regular f/u w/ CARDS- [name]; must work on weight reduction...  ~  January 27, 2017:  44m944moV & [name] was ADM by [facility] 3/7 - 01/26/17 w/ Acute on Chronic diastolic CHF, dilated cardiomyopathy, COPD exac, acute on chr resp failure;  He was diureses 30# of fluid 257# to 227# today but he is quite vehemently opposed to this being diastolicCHF and says it was pneumonia & wouldn't have happened if he had an antibiotic when he needed it (his words); Pulm consult from DrYacoub also remarked about pt being very upset at suggestion of CHF & demanded to see pulmonary...             ...Marland KitchenMarland Kitchen CXR 01/22/17 showed cardiomegaly, aortic atherosclerosis, bilat emphysema &  interstitial prominence in the  bases, no consolidation or effusion..  EKGs 01/2017 showed NSR, poor R prog V1-2, one PVC pair...  2DEcho 01/20/17 was a poor quality study  But showed mild conc LVH (prev mod), norm LVF w/ EF=55-60% & no RWMA, Ao root mildly dil, AoV mild calcif leaflets, MV norm, LAdil 35m, RV dil, norm RVF, PAsys- not meas [id number]  LABS showed BS~130-180 range, Cr=0.8-1.1 range, Hg~10-11 range, WBC~10=>6K, Troponins- neg, BNP=35...    He was treated w/ [phone] placed on BiPAP, Oxygen, NEBS, Solumedrol, & empiric antibiotics; He was seen by Pulmonary & transitioned to oral Levaquin, Prednisone, & continued on his home regimen of Oxygen- 24/7, BiPAP Qhs, NEBS w/ DUONEB Qid & Budesonide Bid, Mucinex '1200mg'$  Bid; Lasix trimmed to '40mg'$ Bid at disch yest... Since disch he is c/o "weak", notes some cough, sput, & SOB; we discussed keeping an Ab on hand so he can take it when he wants at the earliest sign of a resp infection- Levaquin '500mg'$  daily #10 w/ refill written at his request, reminded to always take a probiotic like ALIGN & activia yogurt while on the broad spectrum antibiotics...     EXAM shows Afeb, VSS, O2sat=97% on 4L/min [place];  Wt=227#;  HEENT- neg, mallampati3;  Chest- decr BS bilat, otherw clear w/o w/r/r;  Heart- RR w/o m/r/g;  Abd- obese, soft;  Ext- neg w/o c/c/e. IMP/PLAN>>  EFreelandhas severe end-stage COPD- mixed chr bronchitis & emphysema w/ chr hypoxemia & hypercarbic resp failure, w/ Hx pulmHTN & right heart failure;  Now improved and approaching his baseline on max meds=> O2 at 2-4L/min 24/7 (he has [employer] that supplies 2L/min continuous or up to 6L/min pulse dose flow), nocturnal BiPAP, NEBs w/ Duoneb Qid & Budes 0.25Bid, Mucinex '1200mg'$ Bid, weaning oral Prednisone from his recent hosp, finished oral Levaquin; on ASA81, Lasix40Bid, Avapro75, Imdur30, K20, Zocor20, and off prev Metoprolol... We discussed continuing the Pred taper til gone, continue other meds the same for now & ROV recheck 123mo.  Note: he has been followed for Cards by [name] (HBP, CAD w/ IWMI 1995 & mult PCIs, HL) and last seen in 201`5 but he saw ScRichardson Dopp2 in 5-04/2016...  ADDENDUM>> CT Chest 02/17/17 (11m57mollow up CT Chest- compare to 08/19/16)>> mild cadiomeg w/ atherosclerotic calcif in Ao/ great vessels/ coronaries; no enlarged adenopathy; extensive bullous emphysema, areas of mild interstitial prominence, stable nodular opacities (largest 1.3 x 0.9 cm in inferior LLL abutting the pleura) & no new parenchymal lung opacity; upper abd shows atherosclerotic calcif of Ao, right renal cyst, & an incompletely vis mass in mid abd=> needs CT Abd... ADDENDUM>> CT Abd & Pelvis 02/18/17 showed a large 25 x 16 x 18 cm mass in right abd mesentary, no adenopathy, additional findings include ao & coronary calcif, mod centrilob emphysema, diffuse hepatic steatosis, cholelithiasis & mod left colonic diverticulosis...   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);  [facility] the [facility] eval>>     [place]> Dx w/ soft tissue sarcoma right abd, felt to be high risk for surg due to COPD & cardiac issues=> rec referral to [facility]  XRT by DrBlackstock- [facility] 03/18/17>  They planned 45 Gy in 25 fractions- completed by 05/06/17, taking imodium for loose stools    Pulmonary consult from DrPascual 04/28/17>  He felt that abd surg would pose a high risk of pulm complications given his age, chronic hypercarbic & hypoxemic resp failure, poor exercise capacity, & pulm HTN; in addition they felt he was on a good triple therapy regimen & did not pose any changes...    Pt notes that his abd has shrunk & his breathing is sl better;  still using 4-6L/min O2 & checks his pulse ox readings; he notes the O2 also helps his leg cramps "the nurse says it's oxygen deprivation" (using [facility]); he remains on BiPAP Qhs & rests well, wakes refreshed, denies daytime sleepiness;  He has been regular w/ his NEB rx using Duoneb Qid & Budesonide Bid + AlbutHFA rescue inhaler as needed...     EXAM shows Afeb, VSS, O2sat=99% on 4L/min [place];  Wt=227#;  HEENT- neg, mallampati3;  Chest- decr BS bilat, otherw clear w/o w/r/r;  Heart- RR w/o m/r/g;  Abd- protuberant abd, soft & nontender, norm BS;  Ext- VI, +1edema.  Last CXR was 01/22/17 showing cardiomegaly, aortic atherosclerosis, bilat emphysema & interstitial prominence in the bases, no consolidation or effusion..  Last CT as above - [facility] plans repeat abd CT in August (after the XRT treatment)...  PFTs in Care Everywhere>  [facility] 04/28/17 showed FVC=2.60 (70%), FEV1=0.97 (36%), %1sec=37%...  Labs in Care everywhere 04/2017>  [place]- ok x BS=125, Cr=0.64, LFTs=wnl... 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  ~  October 10, 2017:  8moROV & EKaelobindicates to me that he was told the abd tumor mass had not shrunk enough to consider surgery, and the Oncology team is holding off on ChemoRx until it's necessary for tumor growing, recently it has been about the same... From the pulmonary standpoint he feels that he is breathing OK- notes tired & sl SOB in the afternoon, decr cough & sl beige sput, no hemoptysis; he is NOT very active & getting [facility] for PT;  Using O2 at 4L/min & O2sat=94%... We reviewed the following NOTES is Epic Care Everywhere>     [facility]- he has completed 25 fractions of XRT on 05/06/17, notes some improvement in breathing & has decr O2 to 5L/min; notes some decr abd & leg swelling;  CT Abd&Pelvis 06/24/17 at [facility]>  IMPRESSION: 1)Stable large heterogeneously enhancing mass in the right abdomen compatible with spindle cell neoplasm. 2)846mhyperenhancing lesion in the left hepatic lobe is too small to characterize but favored to represent a flash filling hemangioma or arterioportal shunt. 3)Stable subcentimeter pulmonary nodules as described above. Severe emphysema. Given smoking history, Fleischner guidelines recommend follow-up chest CT in 18 months. 4)Stable right hilar lymph node measuring 1.3 cm short axis, favor reactive. 5)(Hepatic steatosis. 6)Splenomegaly.  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 2) Progressively enlarging left adrenal nodule, currently measuring 9 mm in diameter. Given the avid enhancement and enlargement, this would be concerning for a potential site of metastasis. 3) Extensive pulmonary emphysema. Small pulmonary nodules are indeterminate, but show no interval change from the oldest available comparison CT examinations. Continued attention on follow-up. 4) Additional unchanged findings as noted above;  They rec f/u CT scans in 4 months & close observation... PT was ordered for the pt...    10/03/17 GENETIC Testing results>  Mr. Imran Nuon was contacted by  telephone today to discuss his negative genetic test results. No pathogenic mutations were detected on a 43-gene panel for evaluation of hereditary cancer. Genetic testing was pursued based on patient's extensive personal and family history of cancer.   We reviewed the following medical problems during today's office visit>     COPD, mixed type w/ emphysema>  He has GOLD Stage3 COPD & on O2 as noted, NEBS w/ DUONEB Qid & BUDESONIDE0.25Bid, plus ProairHFA prn when out & about; off the Pred Rx given last during his 09/2016 [facility] & tapered off...    Ex-smoker>  He quit late in 2016...    Chronic resp failure w/ hypoxemia and hypercarbia>  On above meds + Lasix40Bid, K20; also on O2 at 4L/min regularly & BiPAP Qhs=> need to derview download data...    Cor pulmonale, secondary pulm hypertension>  He is on O2 24/7 using 4L/min continuous at night & 4L/min pulse dose during the day...    Intra-abdominal SARCOMA>  See above- eval & management from [place], XRT, Surg Oncology...    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    MEDICAL issues>  HBP, HL, DM, obesity, gastritis, divertics, colon polyps, BPH, hx bladder cancer, DJD, anxiety  EXAM shows Afeb, VSS, O2sat=94% on 4L/min [place];  Wt=235#;  HEENT- neg, mallampati3;  Chest- decr BS bilat, otherw clear w/o w/r/r;  Heart- RR w/o m/r/g;  Abd- protuberant abd, soft & nontender, norm BS;  Ext- VI, +1edema.  LABS> pt reports labs from [facility] office recently were all OK... IMP/PLAN>>  I would like to see a BiPAP download, continue current meds the same & f/u scans as [facility] at [facility]; we will recheck pt in 73mo sooner if needed prn...   ~  January 10, 2018:  36moOV & pulmonary/ medical follow up visit> ErAndreseturns and indicates that he is doing better overall, feels that his breathing is at baseline but he arrives today in wheelchair w/ his O2 at  4L/min via POC- he continues on his NEB Qid, Budes Bid, nightly BiPAP & his oxygen;  We can't get download from [facility]railhe says because Medicare says he didn't qualify & won't pay them?)  He notes appetitie is good, eating well he says;  He continues getting home PT from [facility]... We reviewed the following interval Epic/ Care Everywhere records>      [facility]  F/u primary intra-abd spindle-cell sarcoma, s/p XRT w/ 45Gy in 25  fragments betw 5/17 - 05/06/17;  CT Abd&Pelvis [facility]/27/17 at [facility]>  CONCLUSION:  1) 1.5 cm left adrenal nodule, increased in size and concerning for metastasis.  2) 1.6 cm left hepatic lobe hypodensity measures slightly larger, concerning for malignancy. Consider liver MRI to further assess or attention on follow-up. Liver has a similar lobular contour and cirrhosis is not excluded.  3) 25 x 14 x 17 cm right lower quadrant spindle cell carcinoma is similar in size/appearance.  4) Left lung pleural-based or subpleural nodules are similar to slightly decreased. Prominent right hilar node is similar. Continued attention on follow up is advised.  5) 1.7 cm nodule arising from the thyroid isthmus, similar compared to prior. Consider thyroid ultrasound for further evaluation.  Oncology is considering IR- ablation of the liver & adrenal lesions... We reviewed the following medical problems during today's office visit>     COPD, mixed type w/ emphysema>  He has GOLD Stage3 COPD & on O2-4L/min as noted, NEBS w/ DUONEB Qid & BUDESONIDE0.25Bid, plus ProairHFA prn when out & about; off the Pred Rx given last during his 09/2016 [facility] & tapered off...    Ex-smoker>  He quit late in 2016...    Chronic resp failure w/ hypoxemia and hypercarbia>  On above meds + Lasix40Bid, K20; also on O2 at 4L/min regularly & BiPAP Qhs=> need to review download data...    Cor pulmonale, secondary pulm hypertension>  He is on O2 24/7 using 4L/min continuous at night & 4L/min pulse  dose during the day...    Intra-abdominal SARCOMA w/ mets>  See above- eval & management from [place], XRT, Surg Oncology, Interventional Radiology...    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    MEDICAL issues>  HBP, HL, DM, obesity, gastritis, divertics, colon polyps, BPH, hx bladder cancer, DJD, anxiety EXAM shows Afeb, VSS, O2sat=94% on 4L/min [place];  Wt=235#;  HEENT- neg, mallampati3;  Chest- decr BS bilat, otherw clear w/o w/r/r;  Heart- RR w/o m/r/g;  Abd- protuberant abd, soft & nontender, norm BS;  Ext- VI, +1edema. IMP/PLAN>>  He is at baseline from the pulm standpoint- continue current Opxygen, BiPAP Qhs, Duoneb Qid & Budes Bid;  On-going management of his intra-abdominal sarcoma by PFX mutidisciplinary team...           Problem List:  Hx of SINUSITIS (ICD-473.9) - off Nasonex now & recent Rx w/ Augmentin/ Saline... notes occas sinus congestion, drainage, sl HA, etc... he knows that he needs to quit smoking! ~  10/10: Adm for epistaxis w/ eval by DrWolicki- CXR= NAD, labs OK, rec for saline lavage- nose bleed resolved & disch home (no recurrence so far). ~  1/16: he presented w/ acute max & frontal sinusitis> Rx w/ Augmentin, Align, Saline, Mucinex, etc... ~  3/17: he has acute bronchitic exac- treated w/ Levaquin x7d + his regular meds...  COPD (ICD-496) >>   ~  on ADVAIR 250Bid; STILL SMOKES CIGARETTES & he's refused Chantix...  ~  He had hemoptysis 6/99 without lesion on CXR, CTChest, or Bronchoscopy... ~  Adm 10/10 w/ epistaxsis & eval DrWolicki as noted> he wanted to do Sleep Study but pt cancelled it & declined to resched. 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 ~  CXR 5/12 showed ectatic calcif & tort Aorta, COPD w/ incr markings ar bases, NAD, +degen spondylosis. ~  CXR 6/13 showed COPD/ emphysema, scarring  in the lower lobes, otherw clear lungs, DJD in  spine... ~  CXR 7/14 showed COPD/ emphysema, incr markings and scarring at the bases, NAD. ~  1/15: [name] says he's decr his smoking from prev 3-4ppd down to 3 cig/d;  He remains on Advair250> states breathing is ok, at baseline CAT score=12; baseline cough, sput, no hemoptysis, stable DOE, no edema... ~  CXR 7/15 showed underlying COPD, mild bibasilar fibrosis, norm heart size, DJD in spine, NAD...  ~  PFT 7/15 showed> FVC=3.18 (78%), FEV1=1.22 (38%), %1sec=38; mid-flows=13% predicted... This is c/w GOLD Stage3 COPD and we discussed this> He is rec to quit all smoking immediately; he has been using the nicotine gum & rec to try patches, offered Wellbutrin, he refuses Chantix; needs to continue ADVAIR250Bid & samples givem; he declines to start [place] at this time due to cost & donut hole (we will address this again on return); reminded to use MUCINEX '600mg'$ - 1to2 Bid w/ fluids, and OK to use OTC DELSYM as needed for cough...   ~  1/16: he claims stable on Advair250 but still smoking & can't vs won't quit; we decided to add Spiriva daily via handihaler... 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 ~  12/16:  He was [facility] for 9d w/ hypoxemic resp failure, COPD exac, and found to have pulmHTN w/ 2DEcho showing PAsys=51;  Treated w/ O2, Pred taper, antibiotics, NEBS, Advair/Spiriva, etc;  He says he has quit smoking... ~  [facility] 10/2015> CXR- COPD, bullous dis, incr markings at bases, NAD; Cultures of blood/ sput/ urine were all neg; Serologies for strep & legionella were neg; Viral panel was neg; He was treated w/ Oxygen, IV Solumedrol, Rocephin/ Zithromax, NEBS, Lovenox, flutter, etc; He was disch on Home O2- 2L/min activ & 1L/min rest, Pred  taper, NEBS, Levaquin, Advair250/ Spiriva => improved. 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   HE HAS ESTABLISHED PRIMARY CARE w/ [name], [place] w/ [name] et al >>   HYPERTENSION (ICD-401.9) - controlled on METOPROLOL '25mg'$ Tid,  NORVASC '10mg'$ /d, LISINOPRIL '40mg'$ /d...   ~  11/11:  BP=122/70, not checking BP's at home... denies HA, fatigue, visual changes, CP, palipit, dizziness, syncope, dyspnea, edema, etc. ~  5/12:  BP= 120/72, pt very pleased w/ his wt loss & improved exercise ability. ~  6/13:  BP= 122/64 & feeling well w/o CP, palpit, SOB, edema, etc... ~  12/13:  BP=122/82 & he denies CP, palpit, SOB, edema... ~  7/14: on Metop25-3/d, Amlod10, Lisin40; BP=130/78 & he denies CP, palpit, SOB, edema ~  1/15: BP remains stable on Metop25-2AM & 1PM, Amlod5, Lisin40; BP= 120/72 and he denies CP, palpit, ch in SOB, edema. ~  2016> His meds have been adjusted- now on Rx w/ Amlod10, Lisin10; BP= 130/72 7 he denies CP, palpit, edema; chr SOB/DOE due to his COPD.  CORONARY ARTERY DISEASE (ICD-414.00) - ASA '81mg'$ /d & off Plavix as  of 2/13 officially as he wasn't taking it regularly anyway. ~  Hx prev IWMI and PTCA/ stent in RCA by DrBrodie 12/95 & 5/96...  ~  Myoview 4/08 abnormal w/ inferior ischemia and subseq cath revealing restenoses in RCA- s/p repeat PTCA/ stents...  ~  f/u Myoview 9/08 w/ prev infer infarct, no ischemia, EF=56%... ~  9/11:  pre op cardiac eval DrBrodie> OK to hold ASA/ Plavix for bladder surg. 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 ~  2/13:  He had f/u [name] w/ Myoview showing a fixed defect in  infer wall, no ischemia, EF=58%; pt wasn't taking Plavix, therefore stopped in favor of ASA'81mg'$ /d... ~  3/14:  He had f/u [name]> CAD, IWMI 1995, mult PCIs, last Myoview 2013 was low risk; too sedentary, no changes made; 24 y/o brother died recently w/ AAA rupture... ~  EKG 3/14 showed NSR, rate65, WNL, NAD... ~  He maintains f/u w/ [facility], [name]- known CAD, Hx inferior MI 1995, s/p mult PCIs- most recent 2009; finally qit [facility] for AECOPD w/ hypoxemia; on ASA, statin, Amlod & Lisin.  HYPERCHOLESTEROLEMIA (Mixed Hyperlipidemia) - prev on Simva80 but DrBrodie decr him to SIMVASTATIN '20mg'$ /d + Fish Oil & Red Wine qd... ~  [place] 9/08 on Simva40 showed TChol 151, TG 106, HDL 23, LDL 107 ~  FLP 8/09 on Simva80 showed TChol 142, TG 135, HDL 28, LDL 87 ~  FLP 6/10 on Simva80 showed TChol 151, TG 117, HDL 31, LDL 97 ~  FLP in hosp 10/10 showed TChol 128, TG 110, HDL 26, LDL 80 ~  FLP 5/11 on Simva80 showed TChol 144, TG 215, HDL 26, LDL 89 ~  9/11:  DrBrodie decr him to Simva20... we discussed the need for better diet & wt reduction. ~  FLP 5/12 on Simva20 showed TChol 162, TG 159, HDL 32, LDL 98 ~  FLP 6/13 on Simva20 showed TChol 130, TG 102, HDL 33, LDL 77... rec to incr exercise program & get wt down! ~  FLP 7/14 on Simva20 showed TChol 158, TG 145, HDL 33, LDL 96... He has gained way too much wt 7 must get on diet! ~  Followed by PCP- [name]...  DIABETES MELLITUS, BORDERLINE (ICD-790.29) - on diet alone... ~  labs 9/08 showed BS=100, HgA1c=6.1 ~  labs 8/09 showed BS= 107, A1c= 6.2 ~  labs 6/10 showed BS= 108, A1c= 6.2 ~  lab 5/11 (wt=244#) showed BS= 121. A1c= 6.7.[name]. he was told "get wt down, or start meds". ~  Labs 5/12 showed BS= 109, A1c= 6.1 ~  Labs 6/13 showed BS= 114, & he needs to lose the weight... ~  Labs 7/14 on diet alone showed BS= 120, A1c= 6.7 ~  Followed by PCP- [name]...  OBESITY (ICD-278.00) - weight was down as low as 202# in 2/09, and steadily  incr to 244# by 5/11... ~  weight 5/11 = 244#.[name].  we reviewed diet + exercise necessary to lose weight... ~  weight 11/11 = 217# (after dx & rx for bladder cancer) ~  Weight 5/12 = 214# but says it's 192# at home... ~  Weight 6/13 = 211# but knows it's better since his pant/ belt is down to 36" (under his roll). ~  Weight 7/14 = 230# but he has quit smoking he says, now must lose the weight... ~  Weight 1/15 = 229# ~  Weight  1/17 = 216# ~  Weight 3/17 = 231# and we reviewed diet, exercise, wt reducing strategies...  GASTRITIS (ICD-535.50) - had EGD 10/05 showing gastritis... on RANITADINE '300mg'$ /d due to his Plavix Rx, off prev Omep.  COLONIC POLYPS (ICD-211.3) - last colonoscopy by DrStark 3/04 showed 49m polyp = hyperplastic w/ f/u planned 146yr. 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 ~  4/12:  Pt reported f/u cysto was neg, doing well... ~  10/12:  Note from [name] reviewed> cysto was neg, no recurrent tumors... ~  4/13:  Note from [name] reviewed> neg f/u cysto w/o recurrent TCCa... He wants to continue Q6m62mostoscopies... ~  1/14:  He had f/u w/ [name]> Hx TCCa bladder, s/p TURBT 9/11, given BCG 11/11-12/11 and f/u Cysto 1/12 was neg; repeat cysto 1/14 was neg as well...  DEGENERATIVE JOINT DISEASE (ICD-715.90) - He has c/o right shoulder pain, hip pain, knee pain, and LBP> Rx ADVIL w/ some benefit... offered Ortho referral & he will decide.  ANXIETY (ICD-300.00) - on ALPRAZOLAM 0.'5mg'$  Prn...   Past Surgical History:  Procedure Laterality Date  . cataract surgery  septemeber 2013  . cataract surgery/sub posterior vitrectomy in left eye  1996   Dr. RanZadie Rhine [facility]  [phone] Outpatient Encounter Medications as  of 01/10/2018  Medication Sig  . Albuterol Sulfate (PROAIR RESPICLICK) [id number] Base) MCG/ACT AEPB Inhale 2 puffs into the lungs every 6 (six) hours as needed (shortness of breath).  . ALPRAZolam (XANAX) 0.5 MG tablet Take 0.5 mg by mouth at bedtime.   . Ascorbic Acid (VITAMIN C) 1000 MG tablet Take 1,000 mg by mouth daily.  . aMarland Kitchenpirin 81 MG tablet Take 81 mg by mouth at bedtime.   . budesonide (PULMICORT) 0.25 MG/2ML nebulizer solution Take 2 mLs (0.25 mg total) by nebulization 2 (two) times daily.  . cholecalciferol (VITAMIN D) 400 units TABS tablet Take 400 Units by mouth at bedtime.  . CMarland KitchenNNAMON PO Take 1,000 mg by mouth daily.  . Flaxseed, Linseed, (FLAX SEED OIL PO) Take 1,400 mg by mouth daily.  . furosemide (LASIX) 40 MG tablet Take 40 mg by mouth 2 (two) times daily.  . Garlic [id number] CAPS Take 1,000 mg by mouth daily.  . iMarland Kitchenuprofen (ADVIL,MOTRIN) 600 MG tablet Take 600 mg by mouth every 6 (six) hours as needed for moderate pain.  . iMarland Kitchenratropium-albuterol (DUONEB) 0.5-2.5 (3) MG/3ML SOLN Take 3 mLs by nebulization 4 (four) times daily.  . isosorbide mononitrate (IMDUR) 30 MG 24 hr tablet Take 1 tablet (30 mg total) by mouth daily.  . lMarland Kitchensinopril (PRINIVIL,ZESTRIL) 10 MG tablet Take 10 mg by mouth daily.  . Multiple Vitamin (MULTIVITAMIN WITH MINERALS) TABS tablet Take 1 tablet by mouth daily.  . multivitamin-lutein (OCUVITE-LUTEIN) CAPS capsule Take 1 capsule by mouth 2 (two) times daily.  . nitroGLYCERIN (NITROSTAT) 0.4 MG SL tablet Place 1 tablet (0.4 mg total) under the tongue every 5 (five) minutes as needed for chest pain.  . Omega-3 Fatty Acids (FISH OIL) 1000 MG CPDR Take 1,000 mg by mouth daily.  . PVladimir Fasterycol-Propyl Glycol (SYSTANE OP) Apply 1 drop to eye daily as needed (dry eyes).  . potassium chloride SA (K-DUR,KLOR-CON) 20 MEQ tablet  Take 1 tablet (20 mEq total) by mouth daily. (Patient taking differently: Take 20 mEq by mouth every evening. )  . simvastatin (ZOCOR) 20 MG  tablet Take 1 tablet (20 mg total) by mouth daily at 6 PM.  . sodium chloride (OCEAN) 0.65 % SOLN nasal spray Place 1 spray into both nostrils as needed for congestion.  . vitamin B-12 (CYANOCOBALAMIN) 1000 MCG tablet Take 1,000 mcg by mouth daily.  . [DISCONTINUED] budesonide (PULMICORT) 0.25 MG/2ML nebulizer solution Take 2 mLs (0.25 mg total) by nebulization 2 (two) times daily.  . [DISCONTINUED] budesonide (PULMICORT) 0.25 MG/2ML nebulizer solution Take 2 mLs (0.25 mg total) by nebulization 2 (two) times daily.  . [DISCONTINUED] ipratropium-albuterol (DUONEB) 0.5-2.5 (3) MG/3ML SOLN Take 3 mLs by nebulization 4 (four) times daily.  . [DISCONTINUED] ipratropium-albuterol (DUONEB) 0.5-2.5 (3) MG/3ML SOLN Take 3 mLs by nebulization 4 (four) times daily.   No facility-administered encounter medications on file as of 01/10/2018.     Allergies  Allergen Reactions  . Amlodipine Swelling    Current Medications, Allergies, Past Medical History, Past Surgical History, Family History, and Social History were reviewed in [employer] record.    Review of Systems        See HPI - all other systems neg except as noted... The patient complains of some dyspnea on exertion.  The patient denies anorexia, fever, weight loss, weight gain, vision loss, decreased hearing, hoarseness, chest pain, syncope, peripheral edema, prolonged cough, headaches, hemoptysis, abdominal pain, melena, hematochezia, severe indigestion/heartburn, hematuria, incontinence, muscle weakness, suspicious skin lesions, transient blindness, difficulty walking, depression, unusual weight change, abnormal bleeding, enlarged lymph nodes, and angioedema.     Objective:   Physical Exam    WD, Obese, 72 y/o WM in NAD... Vital Signs:  Reviewed... GENERAL:  Alert & oriented; pleasant & cooperative... HEENT:  [place]/AT, EOM-wnl, PERRLA, EACs-clear, TMs-wnl, NOSE- sl congestion, THROAT-clear & wnl. NECK:  Supple w/  fairROM; no JVD; normal carotid impulses w/o bruits; no thyromegaly or nodules palpated; no lymphadenopathy. CHEST:  decr BS at bases bilat w/ few scat rhonchi, no wheezing/ rales/ or signs of consolidation... HEART:  Regular Rhythm; without murmurs/ rubs/ or gallops detected... ABDOMEN:  Obese, soft, & nontender; sm knot palpated in mid abd above umbilicus; normal bowel sounds; no organomegaly detected. EXT: without deformities, mild arthritic changes; no varicose veins/ +venous insuffic/ no edema. NEURO:  CN's intact; no focal neuro deficits... DERM:  No lesions noted; no rash etc...  RADIOLOGY DATA:  Reviewed in the EPIC EMR & discussed w/ the patient...  LABORATORY DATA:  Reviewed in the EPIC EMR & discussed w/ the patient...   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  11/21/15>  referred for PulmRehab, contact [facility] re- POC, he still needs Full PFTs & CT Chest... We plan ROV in 6 wks 02/02/16> We discussed continuing the NEBS Tid followed by Advair250Bid & Spiriva daily; add Mucinex '1200mg'$ Bid & we will Rx w/ Levaquin500/d x7d for his bronchitic infection (see AVS);  He still needs a CT Chest but wants to wait for now;  Needs to get  into the [facility] program ASAP, and he wants diff DME supplier- we will inquire, plan ROV 47mo6/21/17>   The [place] too $$ so we switched to NEBS w/ Duoneb Qid & Budes Bid; added ProairHFA prn; he desperately needs to lose wt in conjunction w/ his PulmRehab etc... 08/05/16>   He presents w/ a COPD exac-- Rx w/ Levaquin, Pred20-4d taper + NEBS w/ duoneb Qid & budes  Bid... 11/24/16>   EJaquelyn Bitterhas severe COPD/emphysema w/ pulmHTN & CorPulmonale; he refuses to fill inhalers due to costs & he is therefore on O2- 24/7, BiPAP nightly, NEBS w/ Duoneb Qid & Budes Bid, Mucinex 1200Bid, & asked to get into a regular exercise program;  He will continue regular f/u w/ [facility]- [name]; must work on weight reduction... 01/27/17>   EAxilhas severe end-stage COPD- mixed chr bronchitis & emphysema w/ chr hypoxemia & hypercarbic resp failure, w/ Hx pulmHTN & right heart failure; s/p Hosp & now improved and approaching his baseline on max meds=> O2 at 2-4L/min 24/7 (he has [employer] that supplies 2L/min continuous or up to 6L/min pulse dose flow), nocturnal BiPAP, NEBs w/ Duoneb Qid & Budes 0.25Bid, Mucinex '1200mg'$ Bid, weaning oral Prednisone from his recent hosp, finished oral Levaquin; on ASA81, Lasix40Bid, Avapro75, Imdur30, K20, Zocor20, and off prev Metoprolol... We discussed continuing the Pred taper til gone, continue other meds the same for now & ROV recheck 159mo. Note: he has been followed for Cards by [name] (HBP, CAD w/ IWMI 1995 & mult PCIs, HL) and last seen in 201`5 but he saw ScRichardson Dopp2 in 5-04/2016... 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 10/10/17>   I would like to see a BiPAP download, continue current meds the same & [id number] if needed prn... 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   ALPRAZOLAM (XANAX) 0.5 MG TABLET    Take 0.5 mg by mouth at bedtime.    ASCORBIC ACID (VITAMIN C) 1000 MG TABLET    Take 1,000 mg by mouth daily.   ASPIRIN 81 MG TABLET    Take 81 mg by mouth at bedtime.    CHOLECALCIFEROL (VITAMIN D) 400 UNITS TABS TABLET    Take 400 Units by mouth at bedtime.   CINNAMON PO    Take 1,000 mg by mouth daily.  FLAXSEED, LINSEED, (FLAX SEED OIL PO)    Take 1,400 mg by mouth daily.   FUROSEMIDE (LASIX) 40 MG TABLET    Take 40 mg by mouth 2 (two) times daily.   GARLIC 2182 MG CAPS    Take 1,000 mg by mouth daily.   IBUPROFEN (ADVIL,MOTRIN) 600 MG TABLET    Take 600 mg by mouth every 6 (six) hours as needed for moderate pain.   ISOSORBIDE MONONITRATE (IMDUR) 30 MG 24 HR TABLET    Take 1 tablet (30 mg total) by mouth daily.   LISINOPRIL (PRINIVIL,ZESTRIL) 10 MG TABLET    Take 10 mg by mouth daily.   MULTIPLE VITAMIN (MULTIVITAMIN WITH MINERALS) TABS TABLET    Take 1 tablet by mouth daily.   MULTIVITAMIN-LUTEIN (OCUVITE-LUTEIN) CAPS CAPSULE    Take 1 capsule by mouth 2 (two) times daily.   NITROGLYCERIN (NITROSTAT) 0.4 MG SL TABLET    Place 1 tablet (0.4 mg total) under the tongue every 5 (five) minutes as needed for chest pain.   OMEGA-3 FATTY ACIDS (FISH OIL) 1000 MG CPDR    Take 1,000 mg by mouth daily.   POLYETHYL GLYCOL-PROPYL GLYCOL (SYSTANE OP)    Apply 1 drop to eye daily as needed (dry eyes).   POTASSIUM CHLORIDE SA (K-DUR,KLOR-CON) 20 MEQ TABLET    Take 1 tablet (20 mEq total) by mouth daily.   SIMVASTATIN (ZOCOR) 20  MG TABLET    Take 1 tablet (20 mg total) by mouth daily at 6 PM.   SODIUM CHLORIDE (OCEAN) 0.65 % SOLN NASAL SPRAY    Place 1 spray into both nostrils as needed for congestion.   VITAMIN B-12 (CYANOCOBALAMIN) 1000 MCG TABLET    Take 1,000 mcg by mouth daily.  Modified Medications   Modified Medication Previous Medication   BUDESONIDE (PULMICORT) 0.25 MG/2ML NEBULIZER SOLUTION budesonide (PULMICORT) 0.25 MG/2ML nebulizer solution      Take 2 mLs (0.25 mg total) by nebulization 2 (two) times daily.    Take 2 mLs (0.25 mg total) by nebulization 2 (two) times daily.   IPRATROPIUM-ALBUTEROL (DUONEB) 0.5-2.5 (3) MG/3ML SOLN ipratropium-albuterol (DUONEB) 0.5-2.5 (3) MG/3ML SOLN      Take 3 mLs by nebulization 4 (four) times daily.    Take 3 mLs by nebulization 4 (four) times daily.  Discontinued Medications   No medications on file  NOTE>> Advair & Spiriva discontinued due to cost & replaced by DUONEB-Qid & PULMICORT 0,'25mg'$ Bid via NEBULIZER.Marland KitchenMarland Kitchen

## 2018-01-13 DIAGNOSIS — M150 Primary generalized (osteo)arthritis: Secondary | ICD-10-CM | POA: Diagnosis not present

## 2018-01-13 DIAGNOSIS — C499 Malignant neoplasm of connective and soft tissue, unspecified: Secondary | ICD-10-CM | POA: Diagnosis not present

## 2018-01-13 DIAGNOSIS — J449 Chronic obstructive pulmonary disease, unspecified: Secondary | ICD-10-CM | POA: Diagnosis not present

## 2018-01-13 DIAGNOSIS — C679 Malignant neoplasm of bladder, unspecified: Secondary | ICD-10-CM | POA: Diagnosis not present

## 2018-01-13 DIAGNOSIS — J9612 Chronic respiratory failure with hypercapnia: Secondary | ICD-10-CM | POA: Diagnosis not present

## 2018-01-13 DIAGNOSIS — J9611 Chronic respiratory failure with hypoxia: Secondary | ICD-10-CM | POA: Diagnosis not present

## 2018-01-17 DIAGNOSIS — J449 Chronic obstructive pulmonary disease, unspecified: Secondary | ICD-10-CM | POA: Diagnosis not present

## 2018-01-17 DIAGNOSIS — J9611 Chronic respiratory failure with hypoxia: Secondary | ICD-10-CM | POA: Diagnosis not present

## 2018-01-17 DIAGNOSIS — C499 Malignant neoplasm of connective and soft tissue, unspecified: Secondary | ICD-10-CM | POA: Diagnosis not present

## 2018-01-17 DIAGNOSIS — C679 Malignant neoplasm of bladder, unspecified: Secondary | ICD-10-CM | POA: Diagnosis not present

## 2018-01-17 DIAGNOSIS — J9612 Chronic respiratory failure with hypercapnia: Secondary | ICD-10-CM | POA: Diagnosis not present

## 2018-01-17 DIAGNOSIS — M150 Primary generalized (osteo)arthritis: Secondary | ICD-10-CM | POA: Diagnosis not present

## 2018-01-18 ENCOUNTER — Other Ambulatory Visit: Payer: Self-pay | Admitting: Internal Medicine

## 2018-01-18 DIAGNOSIS — E041 Nontoxic single thyroid nodule: Secondary | ICD-10-CM | POA: Diagnosis not present

## 2018-01-18 DIAGNOSIS — D6489 Other specified anemias: Secondary | ICD-10-CM | POA: Diagnosis not present

## 2018-01-18 DIAGNOSIS — I872 Venous insufficiency (chronic) (peripheral): Secondary | ICD-10-CM | POA: Diagnosis not present

## 2018-01-18 DIAGNOSIS — E7849 Other hyperlipidemia: Secondary | ICD-10-CM | POA: Diagnosis not present

## 2018-01-18 DIAGNOSIS — Z9981 Dependence on supplemental oxygen: Secondary | ICD-10-CM | POA: Diagnosis not present

## 2018-01-18 DIAGNOSIS — J449 Chronic obstructive pulmonary disease, unspecified: Secondary | ICD-10-CM | POA: Diagnosis not present

## 2018-01-18 DIAGNOSIS — R911 Solitary pulmonary nodule: Secondary | ICD-10-CM | POA: Diagnosis not present

## 2018-01-18 DIAGNOSIS — C499 Malignant neoplasm of connective and soft tissue, unspecified: Secondary | ICD-10-CM | POA: Diagnosis not present

## 2018-01-18 DIAGNOSIS — R600 Localized edema: Secondary | ICD-10-CM | POA: Diagnosis not present

## 2018-01-18 DIAGNOSIS — I10 Essential (primary) hypertension: Secondary | ICD-10-CM | POA: Diagnosis not present

## 2018-01-18 DIAGNOSIS — Z6837 Body mass index (BMI) 37.0-37.9, adult: Secondary | ICD-10-CM | POA: Diagnosis not present

## 2018-01-19 ENCOUNTER — Other Ambulatory Visit (HOSPITAL_COMMUNITY): Payer: Self-pay

## 2018-01-19 DIAGNOSIS — M150 Primary generalized (osteo)arthritis: Secondary | ICD-10-CM | POA: Diagnosis not present

## 2018-01-19 DIAGNOSIS — J9611 Chronic respiratory failure with hypoxia: Secondary | ICD-10-CM | POA: Diagnosis not present

## 2018-01-19 DIAGNOSIS — J449 Chronic obstructive pulmonary disease, unspecified: Secondary | ICD-10-CM | POA: Diagnosis not present

## 2018-01-19 DIAGNOSIS — C679 Malignant neoplasm of bladder, unspecified: Secondary | ICD-10-CM | POA: Diagnosis not present

## 2018-01-19 DIAGNOSIS — J9612 Chronic respiratory failure with hypercapnia: Secondary | ICD-10-CM | POA: Diagnosis not present

## 2018-01-19 DIAGNOSIS — C499 Malignant neoplasm of connective and soft tissue, unspecified: Secondary | ICD-10-CM | POA: Diagnosis not present

## 2018-01-20 ENCOUNTER — Ambulatory Visit (HOSPITAL_COMMUNITY)
Admission: RE | Admit: 2018-01-20 | Discharge: 2018-01-20 | Disposition: A | Discharge status: Home / Self Care | Payer: Medicare Other | Source: Ambulatory Visit | Attending: Internal Medicine | Admitting: Internal Medicine

## 2018-01-20 DIAGNOSIS — D649 Anemia, unspecified: Secondary | ICD-10-CM | POA: Diagnosis not present

## 2018-01-20 LAB — PREPARE RBC (CROSSMATCH)

## 2018-01-20 LAB — ABO/RH: ABO/RH(D): A POS

## 2018-01-20 MED ORDER — FUROSEMIDE 10 MG/ML IJ SOLN
20.0000 mg | Freq: Once | INTRAMUSCULAR | Status: AC
  Administered 2018-01-20: 20 mg via INTRAVENOUS

## 2018-01-20 MED ORDER — SODIUM CHLORIDE 0.9 % IV SOLN: Freq: Once | INTRAVENOUS | Status: DC

## 2018-01-20 MED ORDER — FUROSEMIDE 10 MG/ML IJ SOLN
INTRAMUSCULAR | Status: AC
  Administered 2018-01-20: 12:00:00 20 mg via INTRAVENOUS
  Filled 2018-01-20: qty 2

## 2018-01-21 LAB — TYPE AND SCREEN
ABO/RH(D): A POS
Antibody Screen: NEGATIVE
UNIT DIVISION: 0
Unit division: 0

## 2018-01-21 LAB — BPAM RBC
BLOOD PRODUCT EXPIRATION DATE: 201903272359
BLOOD PRODUCT EXPIRATION DATE: 201903272359
ISSUE DATE / TIME: 201903081210
ISSUE DATE / TIME: 201903080918
UNIT TYPE AND RH: 6200
Unit Type and Rh: 6200

## 2018-01-24 DIAGNOSIS — J9611 Chronic respiratory failure with hypoxia: Secondary | ICD-10-CM | POA: Diagnosis not present

## 2018-01-24 DIAGNOSIS — C679 Malignant neoplasm of bladder, unspecified: Secondary | ICD-10-CM | POA: Diagnosis not present

## 2018-01-24 DIAGNOSIS — J9612 Chronic respiratory failure with hypercapnia: Secondary | ICD-10-CM | POA: Diagnosis not present

## 2018-01-24 DIAGNOSIS — M150 Primary generalized (osteo)arthritis: Secondary | ICD-10-CM | POA: Diagnosis not present

## 2018-01-24 DIAGNOSIS — C499 Malignant neoplasm of connective and soft tissue, unspecified: Secondary | ICD-10-CM | POA: Diagnosis not present

## 2018-01-24 DIAGNOSIS — J449 Chronic obstructive pulmonary disease, unspecified: Secondary | ICD-10-CM | POA: Diagnosis not present

## 2018-01-25 ENCOUNTER — Ambulatory Visit
Admission: RE | Admit: 2018-01-25 | Discharge: 2018-01-25 | Disposition: A | Discharge status: Home / Self Care | Payer: Medicare Other | Source: Ambulatory Visit | Attending: Internal Medicine | Admitting: Internal Medicine

## 2018-01-25 DIAGNOSIS — E041 Nontoxic single thyroid nodule: Secondary | ICD-10-CM

## 2018-01-27 DIAGNOSIS — M150 Primary generalized (osteo)arthritis: Secondary | ICD-10-CM | POA: Diagnosis not present

## 2018-01-27 DIAGNOSIS — C679 Malignant neoplasm of bladder, unspecified: Secondary | ICD-10-CM | POA: Diagnosis not present

## 2018-01-27 DIAGNOSIS — J9612 Chronic respiratory failure with hypercapnia: Secondary | ICD-10-CM | POA: Diagnosis not present

## 2018-01-27 DIAGNOSIS — J449 Chronic obstructive pulmonary disease, unspecified: Secondary | ICD-10-CM | POA: Diagnosis not present

## 2018-01-27 DIAGNOSIS — C499 Malignant neoplasm of connective and soft tissue, unspecified: Secondary | ICD-10-CM | POA: Diagnosis not present

## 2018-01-27 DIAGNOSIS — J9611 Chronic respiratory failure with hypoxia: Secondary | ICD-10-CM | POA: Diagnosis not present

## 2018-01-28 DIAGNOSIS — C679 Malignant neoplasm of bladder, unspecified: Secondary | ICD-10-CM | POA: Diagnosis not present

## 2018-01-28 DIAGNOSIS — J9611 Chronic respiratory failure with hypoxia: Secondary | ICD-10-CM | POA: Diagnosis not present

## 2018-01-28 DIAGNOSIS — C499 Malignant neoplasm of connective and soft tissue, unspecified: Secondary | ICD-10-CM | POA: Diagnosis not present

## 2018-01-28 DIAGNOSIS — J449 Chronic obstructive pulmonary disease, unspecified: Secondary | ICD-10-CM | POA: Diagnosis not present

## 2018-01-28 DIAGNOSIS — J9612 Chronic respiratory failure with hypercapnia: Secondary | ICD-10-CM | POA: Diagnosis not present

## 2018-01-28 DIAGNOSIS — M150 Primary generalized (osteo)arthritis: Secondary | ICD-10-CM | POA: Diagnosis not present

## 2018-01-31 DIAGNOSIS — J9612 Chronic respiratory failure with hypercapnia: Secondary | ICD-10-CM | POA: Diagnosis not present

## 2018-01-31 DIAGNOSIS — J449 Chronic obstructive pulmonary disease, unspecified: Secondary | ICD-10-CM | POA: Diagnosis not present

## 2018-01-31 DIAGNOSIS — C679 Malignant neoplasm of bladder, unspecified: Secondary | ICD-10-CM | POA: Diagnosis not present

## 2018-01-31 DIAGNOSIS — M150 Primary generalized (osteo)arthritis: Secondary | ICD-10-CM | POA: Diagnosis not present

## 2018-01-31 DIAGNOSIS — C499 Malignant neoplasm of connective and soft tissue, unspecified: Secondary | ICD-10-CM | POA: Diagnosis not present

## 2018-01-31 DIAGNOSIS — J9611 Chronic respiratory failure with hypoxia: Secondary | ICD-10-CM | POA: Diagnosis not present

## 2018-02-03 ENCOUNTER — Other Ambulatory Visit: Payer: Self-pay | Admitting: Internal Medicine

## 2018-02-03 DIAGNOSIS — J449 Chronic obstructive pulmonary disease, unspecified: Secondary | ICD-10-CM | POA: Diagnosis not present

## 2018-02-03 DIAGNOSIS — C679 Malignant neoplasm of bladder, unspecified: Secondary | ICD-10-CM | POA: Diagnosis not present

## 2018-02-03 DIAGNOSIS — M150 Primary generalized (osteo)arthritis: Secondary | ICD-10-CM | POA: Diagnosis not present

## 2018-02-03 DIAGNOSIS — E041 Nontoxic single thyroid nodule: Secondary | ICD-10-CM

## 2018-02-03 DIAGNOSIS — C499 Malignant neoplasm of connective and soft tissue, unspecified: Secondary | ICD-10-CM | POA: Diagnosis not present

## 2018-02-03 DIAGNOSIS — J9612 Chronic respiratory failure with hypercapnia: Secondary | ICD-10-CM | POA: Diagnosis not present

## 2018-02-03 DIAGNOSIS — J9611 Chronic respiratory failure with hypoxia: Secondary | ICD-10-CM | POA: Diagnosis not present

## 2018-02-06 DIAGNOSIS — M150 Primary generalized (osteo)arthritis: Secondary | ICD-10-CM | POA: Diagnosis not present

## 2018-02-06 DIAGNOSIS — C679 Malignant neoplasm of bladder, unspecified: Secondary | ICD-10-CM | POA: Diagnosis not present

## 2018-02-06 DIAGNOSIS — J9612 Chronic respiratory failure with hypercapnia: Secondary | ICD-10-CM | POA: Diagnosis not present

## 2018-02-06 DIAGNOSIS — C499 Malignant neoplasm of connective and soft tissue, unspecified: Secondary | ICD-10-CM | POA: Diagnosis not present

## 2018-02-06 DIAGNOSIS — J449 Chronic obstructive pulmonary disease, unspecified: Secondary | ICD-10-CM | POA: Diagnosis not present

## 2018-02-06 DIAGNOSIS — J9611 Chronic respiratory failure with hypoxia: Secondary | ICD-10-CM | POA: Diagnosis not present

## 2018-02-07 ENCOUNTER — Telehealth: Payer: Self-pay | Admitting: Pulmonary Disease

## 2018-02-07 DIAGNOSIS — C499 Malignant neoplasm of connective and soft tissue, unspecified: Secondary | ICD-10-CM | POA: Diagnosis not present

## 2018-02-07 DIAGNOSIS — J9611 Chronic respiratory failure with hypoxia: Secondary | ICD-10-CM | POA: Diagnosis not present

## 2018-02-07 DIAGNOSIS — M150 Primary generalized (osteo)arthritis: Secondary | ICD-10-CM | POA: Diagnosis not present

## 2018-02-07 DIAGNOSIS — C679 Malignant neoplasm of bladder, unspecified: Secondary | ICD-10-CM | POA: Diagnosis not present

## 2018-02-07 DIAGNOSIS — J9612 Chronic respiratory failure with hypercapnia: Secondary | ICD-10-CM | POA: Diagnosis not present

## 2018-02-07 DIAGNOSIS — J449 Chronic obstructive pulmonary disease, unspecified: Secondary | ICD-10-CM | POA: Diagnosis not present

## 2018-02-07 NOTE — Telephone Encounter (Signed)
Verbal order has been given to [facility]. Nothing further was needed.

## 2018-02-08 DIAGNOSIS — J9611 Chronic respiratory failure with hypoxia: Secondary | ICD-10-CM | POA: Diagnosis not present

## 2018-02-08 DIAGNOSIS — C679 Malignant neoplasm of bladder, unspecified: Secondary | ICD-10-CM | POA: Diagnosis not present

## 2018-02-08 DIAGNOSIS — C499 Malignant neoplasm of connective and soft tissue, unspecified: Secondary | ICD-10-CM | POA: Diagnosis not present

## 2018-02-08 DIAGNOSIS — J449 Chronic obstructive pulmonary disease, unspecified: Secondary | ICD-10-CM | POA: Diagnosis not present

## 2018-02-08 DIAGNOSIS — J9612 Chronic respiratory failure with hypercapnia: Secondary | ICD-10-CM | POA: Diagnosis not present

## 2018-02-08 DIAGNOSIS — M150 Primary generalized (osteo)arthritis: Secondary | ICD-10-CM | POA: Diagnosis not present

## 2018-02-09 DIAGNOSIS — J9612 Chronic respiratory failure with hypercapnia: Secondary | ICD-10-CM | POA: Diagnosis not present

## 2018-02-09 DIAGNOSIS — J9611 Chronic respiratory failure with hypoxia: Secondary | ICD-10-CM | POA: Diagnosis not present

## 2018-02-09 DIAGNOSIS — C679 Malignant neoplasm of bladder, unspecified: Secondary | ICD-10-CM | POA: Diagnosis not present

## 2018-02-09 DIAGNOSIS — J449 Chronic obstructive pulmonary disease, unspecified: Secondary | ICD-10-CM | POA: Diagnosis not present

## 2018-02-09 DIAGNOSIS — C499 Malignant neoplasm of connective and soft tissue, unspecified: Secondary | ICD-10-CM | POA: Diagnosis not present

## 2018-02-09 DIAGNOSIS — M150 Primary generalized (osteo)arthritis: Secondary | ICD-10-CM | POA: Diagnosis not present

## 2018-02-14 DIAGNOSIS — C499 Malignant neoplasm of connective and soft tissue, unspecified: Secondary | ICD-10-CM | POA: Diagnosis not present

## 2018-02-14 DIAGNOSIS — J9612 Chronic respiratory failure with hypercapnia: Secondary | ICD-10-CM | POA: Diagnosis not present

## 2018-02-14 DIAGNOSIS — J9611 Chronic respiratory failure with hypoxia: Secondary | ICD-10-CM | POA: Diagnosis not present

## 2018-02-14 DIAGNOSIS — M150 Primary generalized (osteo)arthritis: Secondary | ICD-10-CM | POA: Diagnosis not present

## 2018-02-14 DIAGNOSIS — J449 Chronic obstructive pulmonary disease, unspecified: Secondary | ICD-10-CM | POA: Diagnosis not present

## 2018-02-14 DIAGNOSIS — C679 Malignant neoplasm of bladder, unspecified: Secondary | ICD-10-CM | POA: Diagnosis not present

## 2018-02-16 DIAGNOSIS — M150 Primary generalized (osteo)arthritis: Secondary | ICD-10-CM | POA: Diagnosis not present

## 2018-02-16 DIAGNOSIS — J9612 Chronic respiratory failure with hypercapnia: Secondary | ICD-10-CM | POA: Diagnosis not present

## 2018-02-16 DIAGNOSIS — C679 Malignant neoplasm of bladder, unspecified: Secondary | ICD-10-CM | POA: Diagnosis not present

## 2018-02-16 DIAGNOSIS — J9611 Chronic respiratory failure with hypoxia: Secondary | ICD-10-CM | POA: Diagnosis not present

## 2018-02-16 DIAGNOSIS — J449 Chronic obstructive pulmonary disease, unspecified: Secondary | ICD-10-CM | POA: Diagnosis not present

## 2018-02-16 DIAGNOSIS — C499 Malignant neoplasm of connective and soft tissue, unspecified: Secondary | ICD-10-CM | POA: Diagnosis not present

## 2018-02-21 DIAGNOSIS — J9611 Chronic respiratory failure with hypoxia: Secondary | ICD-10-CM | POA: Diagnosis not present

## 2018-02-21 DIAGNOSIS — C499 Malignant neoplasm of connective and soft tissue, unspecified: Secondary | ICD-10-CM | POA: Diagnosis not present

## 2018-02-21 DIAGNOSIS — C679 Malignant neoplasm of bladder, unspecified: Secondary | ICD-10-CM | POA: Diagnosis not present

## 2018-02-21 DIAGNOSIS — J449 Chronic obstructive pulmonary disease, unspecified: Secondary | ICD-10-CM | POA: Diagnosis not present

## 2018-02-21 DIAGNOSIS — J9612 Chronic respiratory failure with hypercapnia: Secondary | ICD-10-CM | POA: Diagnosis not present

## 2018-02-21 DIAGNOSIS — M150 Primary generalized (osteo)arthritis: Secondary | ICD-10-CM | POA: Diagnosis not present

## 2018-02-23 DIAGNOSIS — J449 Chronic obstructive pulmonary disease, unspecified: Secondary | ICD-10-CM | POA: Diagnosis not present

## 2018-02-23 DIAGNOSIS — J9612 Chronic respiratory failure with hypercapnia: Secondary | ICD-10-CM | POA: Diagnosis not present

## 2018-02-23 DIAGNOSIS — C499 Malignant neoplasm of connective and soft tissue, unspecified: Secondary | ICD-10-CM | POA: Diagnosis not present

## 2018-02-23 DIAGNOSIS — M150 Primary generalized (osteo)arthritis: Secondary | ICD-10-CM | POA: Diagnosis not present

## 2018-02-23 DIAGNOSIS — C679 Malignant neoplasm of bladder, unspecified: Secondary | ICD-10-CM | POA: Diagnosis not present

## 2018-02-23 DIAGNOSIS — J9611 Chronic respiratory failure with hypoxia: Secondary | ICD-10-CM | POA: Diagnosis not present

## 2018-02-27 DIAGNOSIS — C679 Malignant neoplasm of bladder, unspecified: Secondary | ICD-10-CM | POA: Diagnosis not present

## 2018-02-27 DIAGNOSIS — J449 Chronic obstructive pulmonary disease, unspecified: Secondary | ICD-10-CM | POA: Diagnosis not present

## 2018-02-27 DIAGNOSIS — C499 Malignant neoplasm of connective and soft tissue, unspecified: Secondary | ICD-10-CM | POA: Diagnosis not present

## 2018-02-27 DIAGNOSIS — J9612 Chronic respiratory failure with hypercapnia: Secondary | ICD-10-CM | POA: Diagnosis not present

## 2018-02-27 DIAGNOSIS — J9611 Chronic respiratory failure with hypoxia: Secondary | ICD-10-CM | POA: Diagnosis not present

## 2018-02-27 DIAGNOSIS — M150 Primary generalized (osteo)arthritis: Secondary | ICD-10-CM | POA: Diagnosis not present

## 2018-02-28 DIAGNOSIS — R918 Other nonspecific abnormal finding of lung field: Secondary | ICD-10-CM | POA: Diagnosis not present

## 2018-02-28 DIAGNOSIS — C494 Malignant neoplasm of connective and soft tissue of abdomen: Secondary | ICD-10-CM | POA: Diagnosis not present

## 2018-03-01 ENCOUNTER — Other Ambulatory Visit (HOSPITAL_COMMUNITY)
Admission: RE | Admit: 2018-03-01 | Discharge: 2018-03-01 | Disposition: A | Discharge status: Home / Self Care | Payer: Medicare Other | Source: Ambulatory Visit | Attending: Radiology | Admitting: Radiology

## 2018-03-01 ENCOUNTER — Ambulatory Visit
Admission: RE | Admit: 2018-03-01 | Discharge: 2018-03-01 | Disposition: A | Discharge status: Home / Self Care | Payer: Medicare Other | Source: Ambulatory Visit | Attending: Internal Medicine | Admitting: Internal Medicine

## 2018-03-01 DIAGNOSIS — E041 Nontoxic single thyroid nodule: Secondary | ICD-10-CM

## 2018-03-01 DIAGNOSIS — D34 Benign neoplasm of thyroid gland: Secondary | ICD-10-CM | POA: Insufficient documentation

## 2018-03-04 DIAGNOSIS — J9612 Chronic respiratory failure with hypercapnia: Secondary | ICD-10-CM | POA: Diagnosis not present

## 2018-03-04 DIAGNOSIS — J9611 Chronic respiratory failure with hypoxia: Secondary | ICD-10-CM | POA: Diagnosis not present

## 2018-03-04 DIAGNOSIS — C499 Malignant neoplasm of connective and soft tissue, unspecified: Secondary | ICD-10-CM | POA: Diagnosis not present

## 2018-03-04 DIAGNOSIS — J449 Chronic obstructive pulmonary disease, unspecified: Secondary | ICD-10-CM | POA: Diagnosis not present

## 2018-03-04 DIAGNOSIS — C679 Malignant neoplasm of bladder, unspecified: Secondary | ICD-10-CM | POA: Diagnosis not present

## 2018-03-04 DIAGNOSIS — M150 Primary generalized (osteo)arthritis: Secondary | ICD-10-CM | POA: Diagnosis not present

## 2018-03-06 DIAGNOSIS — K769 Liver disease, unspecified: Secondary | ICD-10-CM | POA: Diagnosis not present

## 2018-03-06 DIAGNOSIS — C494 Malignant neoplasm of connective and soft tissue of abdomen: Secondary | ICD-10-CM | POA: Diagnosis not present

## 2018-03-07 DIAGNOSIS — J9612 Chronic respiratory failure with hypercapnia: Secondary | ICD-10-CM | POA: Diagnosis not present

## 2018-03-07 DIAGNOSIS — J9611 Chronic respiratory failure with hypoxia: Secondary | ICD-10-CM | POA: Diagnosis not present

## 2018-03-07 DIAGNOSIS — C679 Malignant neoplasm of bladder, unspecified: Secondary | ICD-10-CM | POA: Diagnosis not present

## 2018-03-07 DIAGNOSIS — M150 Primary generalized (osteo)arthritis: Secondary | ICD-10-CM | POA: Diagnosis not present

## 2018-03-07 DIAGNOSIS — C499 Malignant neoplasm of connective and soft tissue, unspecified: Secondary | ICD-10-CM | POA: Diagnosis not present

## 2018-03-07 DIAGNOSIS — J449 Chronic obstructive pulmonary disease, unspecified: Secondary | ICD-10-CM | POA: Diagnosis not present

## 2018-03-09 DIAGNOSIS — J9612 Chronic respiratory failure with hypercapnia: Secondary | ICD-10-CM | POA: Diagnosis not present

## 2018-03-09 DIAGNOSIS — J449 Chronic obstructive pulmonary disease, unspecified: Secondary | ICD-10-CM | POA: Diagnosis not present

## 2018-03-09 DIAGNOSIS — C499 Malignant neoplasm of connective and soft tissue, unspecified: Secondary | ICD-10-CM | POA: Diagnosis not present

## 2018-03-09 DIAGNOSIS — J9611 Chronic respiratory failure with hypoxia: Secondary | ICD-10-CM | POA: Diagnosis not present

## 2018-03-09 DIAGNOSIS — M150 Primary generalized (osteo)arthritis: Secondary | ICD-10-CM | POA: Diagnosis not present

## 2018-03-09 DIAGNOSIS — C679 Malignant neoplasm of bladder, unspecified: Secondary | ICD-10-CM | POA: Diagnosis not present

## 2018-03-10 DIAGNOSIS — H04123 Dry eye syndrome of bilateral lacrimal glands: Secondary | ICD-10-CM | POA: Diagnosis not present

## 2018-03-13 DIAGNOSIS — J9612 Chronic respiratory failure with hypercapnia: Secondary | ICD-10-CM | POA: Diagnosis not present

## 2018-03-13 DIAGNOSIS — M150 Primary generalized (osteo)arthritis: Secondary | ICD-10-CM | POA: Diagnosis not present

## 2018-03-13 DIAGNOSIS — J9611 Chronic respiratory failure with hypoxia: Secondary | ICD-10-CM | POA: Diagnosis not present

## 2018-03-13 DIAGNOSIS — J449 Chronic obstructive pulmonary disease, unspecified: Secondary | ICD-10-CM | POA: Diagnosis not present

## 2018-03-13 DIAGNOSIS — C499 Malignant neoplasm of connective and soft tissue, unspecified: Secondary | ICD-10-CM | POA: Diagnosis not present

## 2018-03-13 DIAGNOSIS — C679 Malignant neoplasm of bladder, unspecified: Secondary | ICD-10-CM | POA: Diagnosis not present

## 2018-03-16 DIAGNOSIS — C499 Malignant neoplasm of connective and soft tissue, unspecified: Secondary | ICD-10-CM | POA: Diagnosis not present

## 2018-03-16 DIAGNOSIS — C679 Malignant neoplasm of bladder, unspecified: Secondary | ICD-10-CM | POA: Diagnosis not present

## 2018-03-16 DIAGNOSIS — J449 Chronic obstructive pulmonary disease, unspecified: Secondary | ICD-10-CM | POA: Diagnosis not present

## 2018-03-16 DIAGNOSIS — J9612 Chronic respiratory failure with hypercapnia: Secondary | ICD-10-CM | POA: Diagnosis not present

## 2018-03-16 DIAGNOSIS — M150 Primary generalized (osteo)arthritis: Secondary | ICD-10-CM | POA: Diagnosis not present

## 2018-03-16 DIAGNOSIS — J9611 Chronic respiratory failure with hypoxia: Secondary | ICD-10-CM | POA: Diagnosis not present

## 2018-03-21 DIAGNOSIS — C679 Malignant neoplasm of bladder, unspecified: Secondary | ICD-10-CM | POA: Diagnosis not present

## 2018-03-21 DIAGNOSIS — J449 Chronic obstructive pulmonary disease, unspecified: Secondary | ICD-10-CM | POA: Diagnosis not present

## 2018-03-21 DIAGNOSIS — J9612 Chronic respiratory failure with hypercapnia: Secondary | ICD-10-CM | POA: Diagnosis not present

## 2018-03-21 DIAGNOSIS — C499 Malignant neoplasm of connective and soft tissue, unspecified: Secondary | ICD-10-CM | POA: Diagnosis not present

## 2018-03-21 DIAGNOSIS — J9611 Chronic respiratory failure with hypoxia: Secondary | ICD-10-CM | POA: Diagnosis not present

## 2018-03-21 DIAGNOSIS — M150 Primary generalized (osteo)arthritis: Secondary | ICD-10-CM | POA: Diagnosis not present

## 2018-03-23 DIAGNOSIS — M150 Primary generalized (osteo)arthritis: Secondary | ICD-10-CM | POA: Diagnosis not present

## 2018-03-23 DIAGNOSIS — J9612 Chronic respiratory failure with hypercapnia: Secondary | ICD-10-CM | POA: Diagnosis not present

## 2018-03-23 DIAGNOSIS — C499 Malignant neoplasm of connective and soft tissue, unspecified: Secondary | ICD-10-CM | POA: Diagnosis not present

## 2018-03-23 DIAGNOSIS — J449 Chronic obstructive pulmonary disease, unspecified: Secondary | ICD-10-CM | POA: Diagnosis not present

## 2018-03-23 DIAGNOSIS — C679 Malignant neoplasm of bladder, unspecified: Secondary | ICD-10-CM | POA: Diagnosis not present

## 2018-03-23 DIAGNOSIS — J9611 Chronic respiratory failure with hypoxia: Secondary | ICD-10-CM | POA: Diagnosis not present

## 2018-03-28 DIAGNOSIS — C494 Malignant neoplasm of connective and soft tissue of abdomen: Secondary | ICD-10-CM | POA: Diagnosis not present

## 2018-03-30 DIAGNOSIS — J449 Chronic obstructive pulmonary disease, unspecified: Secondary | ICD-10-CM | POA: Diagnosis not present

## 2018-03-30 DIAGNOSIS — C679 Malignant neoplasm of bladder, unspecified: Secondary | ICD-10-CM | POA: Diagnosis not present

## 2018-03-30 DIAGNOSIS — M150 Primary generalized (osteo)arthritis: Secondary | ICD-10-CM | POA: Diagnosis not present

## 2018-03-30 DIAGNOSIS — C499 Malignant neoplasm of connective and soft tissue, unspecified: Secondary | ICD-10-CM | POA: Diagnosis not present

## 2018-03-30 DIAGNOSIS — J9612 Chronic respiratory failure with hypercapnia: Secondary | ICD-10-CM | POA: Diagnosis not present

## 2018-03-30 DIAGNOSIS — J9611 Chronic respiratory failure with hypoxia: Secondary | ICD-10-CM | POA: Diagnosis not present

## 2018-03-31 DIAGNOSIS — M150 Primary generalized (osteo)arthritis: Secondary | ICD-10-CM | POA: Diagnosis not present

## 2018-03-31 DIAGNOSIS — J9612 Chronic respiratory failure with hypercapnia: Secondary | ICD-10-CM | POA: Diagnosis not present

## 2018-03-31 DIAGNOSIS — J9611 Chronic respiratory failure with hypoxia: Secondary | ICD-10-CM | POA: Diagnosis not present

## 2018-03-31 DIAGNOSIS — C499 Malignant neoplasm of connective and soft tissue, unspecified: Secondary | ICD-10-CM | POA: Diagnosis not present

## 2018-03-31 DIAGNOSIS — J449 Chronic obstructive pulmonary disease, unspecified: Secondary | ICD-10-CM | POA: Diagnosis not present

## 2018-03-31 DIAGNOSIS — C679 Malignant neoplasm of bladder, unspecified: Secondary | ICD-10-CM | POA: Diagnosis not present

## 2018-04-04 DIAGNOSIS — C499 Malignant neoplasm of connective and soft tissue, unspecified: Secondary | ICD-10-CM | POA: Diagnosis not present

## 2018-04-04 DIAGNOSIS — J9612 Chronic respiratory failure with hypercapnia: Secondary | ICD-10-CM | POA: Diagnosis not present

## 2018-04-04 DIAGNOSIS — M150 Primary generalized (osteo)arthritis: Secondary | ICD-10-CM | POA: Diagnosis not present

## 2018-04-04 DIAGNOSIS — C679 Malignant neoplasm of bladder, unspecified: Secondary | ICD-10-CM | POA: Diagnosis not present

## 2018-04-04 DIAGNOSIS — J9611 Chronic respiratory failure with hypoxia: Secondary | ICD-10-CM | POA: Diagnosis not present

## 2018-04-04 DIAGNOSIS — J449 Chronic obstructive pulmonary disease, unspecified: Secondary | ICD-10-CM | POA: Diagnosis not present

## 2018-04-06 DIAGNOSIS — J449 Chronic obstructive pulmonary disease, unspecified: Secondary | ICD-10-CM | POA: Diagnosis not present

## 2018-04-06 DIAGNOSIS — C499 Malignant neoplasm of connective and soft tissue, unspecified: Secondary | ICD-10-CM | POA: Diagnosis not present

## 2018-04-06 DIAGNOSIS — C679 Malignant neoplasm of bladder, unspecified: Secondary | ICD-10-CM | POA: Diagnosis not present

## 2018-04-06 DIAGNOSIS — M150 Primary generalized (osteo)arthritis: Secondary | ICD-10-CM | POA: Diagnosis not present

## 2018-04-06 DIAGNOSIS — J9611 Chronic respiratory failure with hypoxia: Secondary | ICD-10-CM | POA: Diagnosis not present

## 2018-04-06 DIAGNOSIS — J9612 Chronic respiratory failure with hypercapnia: Secondary | ICD-10-CM | POA: Diagnosis not present

## 2018-04-12 ENCOUNTER — Encounter: Payer: Self-pay | Admitting: Pulmonary Disease

## 2018-04-12 ENCOUNTER — Ambulatory Visit (INDEPENDENT_AMBULATORY_CARE_PROVIDER_SITE_OTHER): Payer: Medicare Other | Admitting: Pulmonary Disease

## 2018-04-12 VITALS — BP 122/58 | HR 82 | Temp 98.0°F | Ht 66.0 in | Wt 226.6 lb

## 2018-04-12 DIAGNOSIS — I420 Dilated cardiomyopathy: Secondary | ICD-10-CM | POA: Diagnosis not present

## 2018-04-12 DIAGNOSIS — M159 Polyosteoarthritis, unspecified: Secondary | ICD-10-CM

## 2018-04-12 DIAGNOSIS — Z6837 Body mass index (BMI) 37.0-37.9, adult: Secondary | ICD-10-CM

## 2018-04-12 DIAGNOSIS — C499 Malignant neoplasm of connective and soft tissue, unspecified: Secondary | ICD-10-CM | POA: Diagnosis not present

## 2018-04-12 DIAGNOSIS — M150 Primary generalized (osteo)arthritis: Secondary | ICD-10-CM

## 2018-04-12 DIAGNOSIS — J9621 Acute and chronic respiratory failure with hypoxia: Secondary | ICD-10-CM | POA: Diagnosis not present

## 2018-04-12 DIAGNOSIS — I2510 Atherosclerotic heart disease of native coronary artery without angina pectoris: Secondary | ICD-10-CM | POA: Diagnosis not present

## 2018-04-12 DIAGNOSIS — Z9989 Dependence on other enabling machines and devices: Secondary | ICD-10-CM

## 2018-04-12 DIAGNOSIS — Z87891 Personal history of nicotine dependence: Secondary | ICD-10-CM | POA: Diagnosis not present

## 2018-04-12 DIAGNOSIS — I272 Pulmonary hypertension, unspecified: Secondary | ICD-10-CM | POA: Diagnosis not present

## 2018-04-12 DIAGNOSIS — J449 Chronic obstructive pulmonary disease, unspecified: Secondary | ICD-10-CM

## 2018-04-12 DIAGNOSIS — I10 Essential (primary) hypertension: Secondary | ICD-10-CM | POA: Diagnosis not present

## 2018-04-12 DIAGNOSIS — J9622 Acute and chronic respiratory failure with hypercapnia: Secondary | ICD-10-CM | POA: Diagnosis not present

## 2018-04-12 NOTE — Patient Instructions (Signed)
Today we updated your med list in our EPIC system...    Continue your current medications the same...  Continue your Oxygen therapy...  Continue the NEBULIZER w/ Duoneb 4 times daily & the Budesonide twice daily...  Stay as active as possible...  Call for any questions...  Let's plan a follow up visit in 3-71mo, sooner if needed for problems.Marland KitchenMarland Kitchen

## 2018-04-13 ENCOUNTER — Encounter: Payer: Self-pay | Admitting: Pulmonary Disease

## 2018-04-13 NOTE — Progress Notes (Signed)
Subjective:    Patient ID: [name], male    DOB: [birth year], 72 y.o.   MRN: [id number]  HPI 72 y/o WM here for a follow up visit and on-going management of mult med problems...  ~  SEE PREV EPIC NOTES FOR OLDER DATA >>    CXR 7/14 showed COPD/ emphysema, incr markings and scarring at the bases, NAD...  LABS 7/14:  FLP- ok on Simva20;  Chems- ok x BS=120 A1c=6.7;  CBC- wnl;  TSH=1.26;  PSA=0.41...  [name] has established Primary Care follow up w/ [name] at [facility]>>   CXR 7/15 showed underlying COPD, mild bibasilar fibrosis, norm heart size, DJD in spine, NAD...   PFT 7/15 showed> FVC=3.18 (78%), FEV1=1.22 (38%), %1sec=38; mid-flows=13% predicted... This is c/w GOLD Stage3 COPD and we discussed this...  ~  December 05, 2014:  75moROV & pulmonary recheck; ECoralhas continued smoking, perhaps decr slightly to 1/2ppd but unable to quit & [name] has gained 6# in the interim up to 221# today;  His CC is sinusitis w/ nasal congestion, drainage & green mucus plus maxillary/ frontal HAs; [name] denies f/c/s, notes stable cough & chest symptoms w/ DOE;  [name] has been taking his Advair250Bid but prev refused addition of an anticholinergic due to "the donut hole";  States his breathing is "fine, except in cold air" ( we discussed wearing a scarf in winter etc)... [name] has GOLD Stage3 COPD w/ FEV1=1.22 (38%predicted) done 7/15;;  Exam shows decr BS bilat w/ few scat rhonchi & mild exp wheezing esp w/ forced maneuver...     We reviewed prob list, meds, xrays and labs> see below for updates >> [name]... PLAN>> we discussed treating his acute max sinusitis w/ Augmentin875Bid, plus align daily & nasal saline as needed; [name] knows the importance of smoking cessation but is not motivated & declines smoking cessation help; [name] will continue the Advair250Bid 7 we are adding spiriva via handihaler once per day; we plan ROV in 621mo/ f/u CXR &  PFT, call in the interim for any breathing issues....   ~  June 05, 2015:  62m11moV & ErnBinghamports a good interval, no new complaints or concerns; still smoking +/-1ppd and blames family stress; notes cough, sm amy yellow sput, no hemoptysis, SOB w/o change/stable- still mows 3 yards, does chores, tends to 20+chickens & their eggs, etc; [name] has repeatedly declined smoking cessation help, clearly not motivated to quit, not even doing his inhalers regularly!!!    COPD, +smoker> on Advair250Bid & Spiriva daily; symptoms as above, [name] is stoic, still smoking 1ppd & NOT motivated to quit, asked to cut back & use both inhalers regularly...    HBP> on Metop25Bid, Amlod10, Lisin40; BP=114/64 & [name] denies CP, palpit, SOB, edema...    CAD> on ASA81; followed by [name] & seen 4/15 (overdue for f/u)> CAD, IWMI 1995, mult PCIs, last Myoview 2013 was low risk; too sedentary, no changes made; 73 20o brother died w/ AAA rupture...    Chol> on Simva20; FLP 7/14 showed TChol 158, TG 145, HDL 33, LDL 96; needs better diet & wt reduction and ret for Fasting blood work overdue.    DM> on diet alone; LABS 7/14 showed BS= 120, A1c= 6.7; we reviewed low carb diet, exercise program, and wt reduction...    Obese> wt recorded as 204# today but it doesn't look like [name]'s lost 26#; we reviewed diet &  exercise...    GI- Gastritis, polyps> prev on Zantac; prev colon 2004 w/ hyperplastic polyp removed & f/u 8/14 by DrStark w/ 3 polyps removed (5-7m size, all were tubular adenomas), +divertics in sigmoid, f/u planned 5 yrs... NOTE> exam showed a small palp knot in mid-abd above the umbilicus ?etiology (I offered CT Abd but [name] declines & wants to discuss w/ his primary- [name]).    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    DJD> on OTC  analgesics as needed...    Anxiety> on Alpraz0.7m29mrn... We reviewed prob list, meds, xrays and labs> see below for updates >>  NOTE> [name] IS FOLLOWED BY [name] for GEN MED/ PCP now...  CXR 06/05/15 showed stable heart size, Ao atherosclerosis & tortuousiy, COPD/emphysema, incr markings at the bases, arthritis in Tspine...  LABS 7/16: pt asked to ret for fasting blood work... IMP/PLAN>>  ErnJuanantonioys [name] is stable but continues to smoke 1ppd; CXR w/ COPD/emphysema, atherosclerotic Ao, chronic changes but NAD- we discussed the low-dose screening CT Chest for the early detection of lung cancer ([name] is a candidate & [name] will consider participating in this program); in the meanwhile- continue Advair/ Spiriva regularly;  [name] is overdue for Cards f/u w/ [name], and needs to continue Q6mo21mo w/ [name] (w/ copy of notes to us);KoreaHe will ret for FASTING blood work.  ADDENDUM>> given his age and smoking history, ErneMassimo [name] candidate for LUNG CANCER SCREENING w/ LOW-DOSE CT Chest>>  This office visit constitutes a face-to-face visit for the purpose of lung cancer screening counseling and a shared decision making visit...  Eligibility for LDCT screening:  1) Age=70 (must be 55-77y/o);  2) Absence of symptoms of lung cancer: [name] denies CP, ch in SOB, hemoptysis.  Smoking History:  1) [name] has 60+ pk-yrs (must have >30 pk-yr hx smoking);  2) Smoking status- still smoking ~1ppd (current or former smoker who quit<15 yrs ago).  Counseling:  Importance of smoking cessation &/or continued abstinence discussed ; offered smoking cessation interventions- pt declined; pt agreed to continued LDCT screening annually.  Shared decision making:  We reviewed the potential benefits and harms of screening> eg. Early detection of lung cancer and improved survival, the need for additional diagnostic tests & possible surgery, the risk of over-diagnosis/ false positives/ and radiation exposure... All questions were answered  to the best of my ability & [name] would like to consider the LDCT screening & [name] will let us kKoreaw his decision=> [name] ignored this discussion & never called.  ~  October 31, 2015:  5 month ROV & post hospital follow up visit>  [name] was Adm 12/5 - 10/29/15 by [facility] w/ acute hypoxemic resp failure precipitated by a COPD exac; for his part [name] noted incr SOB, cough, green sput, & "slowing down", [name] denied f/c/s, no CP, etc;  In the ER [name] was hypoxemic w/ O2sats in the 70s on RA and eval revealed> CXR- COPD, bullous dis, incr markings at bases, NAD; Cultures of blood/ sput/ urine were all neg; Serologies for strep & legionella were neg; Viral panel was neg;  LABs were OK (WBC=7.9=>14, Hg=15.3=>13.9, BS=100-140, BNP=498);  EKG showed NSR, rate78, NSSTTWA;  2DEcho 10/21/15 showed mildLVH, norm LVF w/ EF=55-60%, no regional wall motion abn, LA-mod  dil, RA- mod to severe dil, RV- mod dil, mod TR, PAsys-50mHg... [name] was treated w/ Oxygen, IV Solumedrol, Rocephin/ Zithromax, NEBS, Lovenox, flutter, etc; [name] was disch on Home O2- 2L/min activ & 1L/min rest, Pred taper, Levaquin, Advair250/ Spiriva...  [name] returns today feeling better, notes his home pulse-ox checks are improving & [name] has less SOB/DOE, less cough/sput, etc;  [name] says [name] has not smoked since PTA...      EXAM shows Afeb, VSS, O2sat=94% on 2L/min [place];  Wt=210# ([name] was 204 prev);  HEENT- neg, mallampati1;  Chest- decr BS bilat, few scat rhonchi, no w/r/consolidation;  Heart- RR w/o m/r/g;  Abd- obese, soft;  Ext- neg w/o c/c/e...  IMP/PLAN>>  COPD/emphysema, GOLD Stage 3, chronic hypoxemic resp failure- on Home O2 now, exsmoker- quit 10/2015, pulmonary HTN (PAsys by EClarene Duke- WHO group 3>  I congratulated him on not smoking & we discussed continuing the [facility] regularly ([facility], Prednisone taper, Advair, Spiriva, NEBS w/ Albut, Mucinex/flutter;  [name] will follow up in 3 wks when the Pred has been tapered & we briefly discussed the need for Full PFTs and CT  Chest later...   ~  November 21, 2015:  3wk ROV & MrWicker reports that [name] feels much better, SOB improved, cough improved, still prod of a sm amt green sput w/o blood w/o f/c/s;  [name]'s been using the NEB w/ Albut TID followed by his AdvairBid & SpirivaQd; [name] is off the Pred now, [name] is too sedentary & not exercising => [name] needs PULM REHAB & we will refer, [name] will [facility] as to what [name] might be elig for...       EXAM shows Afeb, VSS, O2sat=98% on 2L/min [place];  Wt=216# ([name] was 204-210# prev);  HEENT- neg, mallampati2;  Chest- decr BS bilat, otherw clear w/o w/r/r;  Heart- RR w/o m/r/g;  Abd- obese, soft;  Ext- neg w/o c/c/e...  IMP/PLAN>>  COPD/emphysema, GOLD Stage 3, chronic hypoxemic resp failure- on Home O2, exsmoker- quit 10/2015, pulmonary HTN (PAsys by EClarene Duke- WHO group 3> referred for PulmRehab, contact [facility] re- POC, [name] still needs Full PFTs & CT Chest... We plan ROV in 6 wks.  ~  February 03, 2016:  2-342moOV & ErSharman Crateeturns w/ his severe obstructive lung dis, GOLD Stage 3, chr hypoxemic resp failure w/ pulmHTN (WHO group3); [name] quit smoking 10/2015 & is using home O2 3L/min, NEBS w/ Albut tid, Advair250Bid, Spiriva daily, Mucinex etc... [name] reports that breathing is stable overall but [name] is c/o 2m28mo URI w/ cough, thick yellow sput hard to produce, incr SOB 7 chest tightness but no fever, no CP=> [name] wants another antibiotic... [name] spent much time ventilating about his problems w/ DME-[facility], his O2 set-up, & [facility] program- they still haven't contacted him about starting Rehab... [name] does have mult entries into EPIC from THNSilexotes reviewed...     EXAM shows Afeb, VSS, O2sat=92% on 3L/min [place];  Wt=231# (up 15# & [name] was 204-210# prev);  HEENT- neg, mallampati2;  Chest- decr BS bilat, otherw clear w/o w/r/r;  Heart- RR w/o m/r/g;  Abd- obese, soft;  Ext- neg w/o c/c/e.  PFT 02/03/16>  FVC=2.57 (68%), FEV1=1.05 (38%), %1sec=41, mid-flows reduced at 18% predicted;  post bronchodil FEV1 w/o change;  TLC=6.67 (107%), RV=3.97 (176%), RV/TLC=60;  DLCO=22% and DL/VA=29%... This is c/w a severe obstructive ventilatory defect w/ air trapping and severely decr DLCO c/w emphysema- GOLD Stage  3 COPD... IMP/PLAN>>  We discussed continuing the NEBS Tid followed by Advair250Bid & Spiriva daily; add Mucinex 1234mBid & we will Rx w/ Levaquin500/d x7d for his bronchitic infection (see AVS);  [name] still needs a CT Chest but wants to wait for now;  [facility], and [name] wants diff DME supplier- we will inquire, plan ROV 334mo. ADDENDUM 03/18/16>> Pt unable to afford inhalers- ADVAIR & SPIRIVA discontinued and placed on NEBULIZER w/ DUONEB QID & PULMICORT 0.25 BID regularly (we will update med list to reflect this change).  ~  May 05, 2016:  4m36moV & [name] notes DOE w/ exertion- [name] is in PulHealth Netusing up to 6L/min w/ exercise ([name] tried Oxy[employer] 1 Automotiveusing 3-4L/min at rest & Qhs; [name] feels that [name] is making some progress & I reminded him the DIET + EXERCISE and wt loss would be very beneficial to his breathing; states [name] is walking & mowing on his off days (ie- when not in rehab); despite everything his wt continues to climb- now up 4# to 235# "exercise makes me hungry"... we reviewed the following medical problems during today's office visit >> his new PCP is [name]...    COPD, ex-smoker, chr hypoxemic RF> on NEB w/ Duoneb Qid + Budes-Bid + HFA prn (Advair & Spiriva too $$); [name] finally quit smoking, symptoms as above, [name] is now in pulm rehab but having issues w/ low sats...    HBP> on MetopER-82m89mM&1PM), Amlod10, Lisin10, Lasix40Bid; BP=136/66 & [name] denies CP, palpit, SOB, edema...    CAD> on ASA81, Imdur30; followed by [name] & seen 04/2016> CAD, IWMI 1995, mult PCIs, last Myoview 2013 was low risk; too sedentary, edema decr w/ Lasix; 73 y69 brother died w/ AAA rupture...    Chol> on Simva20; FLP 12/16 showed TChol 138, TG 75, HDL 25, LDL 98; needs  better diet & wt reduction and better compliance.    DM> on diet alone; LABS 5/17 showed BS= 118, A1c= per PCP; we reviewed low carb diet, exercise program, and wt reduction...    Obese> wt = 235#, 5'6"Tall, BMI=38;  we reviewed diet & exercise...    GI- Gastritis, polyps> prev on Zantac; prev colon 2004 w/ hyperplastic polyp removed & f/u 8/14 by DrStark w/ 3 polyps removed (5-7mm 19me, all were tubular adenomas), +divertics in sigmoid, f/u planned 5 yrs... NOTE> exam showed a small palp knot in mid-abd above the umbilicus ?etiology (I offered CT Abd but [name] declines & wants to discuss w/ his primary- [name]).    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     DJD> on OTC analgesics as needed...    Anxiety> on Alpraz0.5mg pr30m. EXAM shows Afeb, VSS, O2sat=92% on 3L/min [place];  Wt=231# (up 15# & [name] was 204-210# prev);  HEENT- neg, mallampati3;  Chest- decr BS bilat, otherw clear w/o w/r/r;  Heart- RR w/o m/r/g;  Abd- obese, soft;  Ext- neg w/o c/c/e. IMP/PLAN>>  [name] is rec to continue current meds regularly, get on diet & get wt down , continue in PulmRehab, etc; we wrote for a ProairHFA rescue inhaler for prn use...   ~  August 05, 2016:  4mo ROV29moast visit we rec continuing same meds but stressed DIET, EXERCISE, wt reduction-- unfortunately [name]  has GAINED 8# up to 243# (BMI=39) in the interval;  [name] states that "I don't get hungry, I only eat one sandwich & sometimes a salad at night;  [name] says "they kicked me out" of the [facility] program due to low oxygens even w/ use of the Oxymizer;  [name] says [name] is exercising at home now;  [name] has established PCP w/ [name] & apparently [name] did labs which pt reports were "OK".    [name] c/o cough, chest congestion, yellow sput, ans a "sinus infection" w/ yellow drainage; notes incr  SOB but denies CP/ f/c/s, edema;   EXAM shows Afeb, VSS, O2sat=92% on 3L/min [place];  Wt=231# (up 15# & [name] was 204-210# prev);  HEENT- neg, mallampati3;  Chest- decr BS bilat, otherw clear w/o w/r/r;  Heart- RR w/o m/r/g;  Abd- obese, soft;  Ext- neg w/o c/c/e. IMP/PLAN>>  Kesean describes an upper resp infection & COPD exac=> Rx w/ Levaquin, Pred32m-4d taper, + his NEBS w/ duoneb Qid & Budes Bid; [name] desperately needs to get the weight down...  ~  October 13, 2016:  EJamarquisreturns and says "I've been worse" & indicates that [name] really likes the BiPAP [name] was placed on & disch with after his last Hosp 11/10 - 09/29/16; records reviewed by me- Adm w/ cough, yellow sput, chills and incr SOB; CXR revealed COPD, incr markings & bibasilar atx; Sput/ blood cultures and resp viral panel were all neg; LABS showed WBC~13K, anemia w/ Hg~11, Chems- ok x elev BS (his PCP is [name]); BNP was 29; ABGs showed pH=7.38, pCO2=58, pO2=79 on [place] O2; note that Bicarb level ~30;  2DEcho showed EF 60-65%, Gr1DD, PAsys~7821mg;  [name] was felt to has a COPD exac, Cor Pulmonale- treated w/ BiPAP ([name] was INTOL to CPAP), given IV Solumedrol (DC on Pred taper), IV Zithromax, NEB treatments and improved...    [name] now returns & notes that [name] likes the BiPAP, feels like it is really helping (resting better, sleeps thru the night, wakes refreshed, no daytime sleepiness, & naps 2/7 days now) & wants to continue the BiPAP; NOTE that we had rec a sleep study to him on mult occas but [name] has always refused!  [name] now agrees to a sleep study, hoping that it will lead to medicare approval for his home BiPAP => [name] QUALIFIES FOR BiPAP BASED ON COPD DIAGNOSIS w/ ABG SHOWING pCO2>52 ON HIS NASAL CANNULA O2, [name] needs the SLEEP OXIMETRY STUDY confirming O2sat<88% for cumulative >21m64mwhile on his prescribed O2, and OSA/CPAP treatment have been considered & ruled out... [name] has a home O2conc & a POC which [name] uses 3-4L pulse dose w/ his home sats in the 90s at rest &  down to 88% w/ exerc...     COPD, mixed type w/ emphysema>  [name] has GOLD Stage3 COPD & on O2 as noted, NEBS w/ DUONEB Qid & BUDESONIDE0.25Bid, plus ProairHFA prn when out & about; [facility] the Pred [facility]...    Ex-smoker>  [name] quit late in 2016...    Chronic resp failure w/ hypoxemia and hypercarbia>  On above meds + ASA, Metoprolol, Imdur, Lasix    Cor pulmonale, secondary pulm hypertension>  [name] is on O2 24/7 using 4L/min continuous at night & 4L/min pulse dose during the day...    CARDIAC issues>  HBP, CAD w/ IWMI 1995 & mult PCIs, HL (on Simva20)- all followed by [name]...    MEDICAL issues>  HBP,  HL, DM, obesity, gastritis, divertics, colon polyps, BPH, hx bladder cancer, DJD, anxiety EXAM shows Afeb, VSS, O2sat=97% on 4L/min [place];  Wt=240#;  HEENT- neg, mallampati3;  Chest- decr BS bilat, otherw clear w/o w/r/r;  Heart- RR w/o m/r/g;  Abd- obese, soft;  Ext- neg w/o c/c/e.  CXR 09/26/16>  Norm heart size, incr bibasilar atx... IMP/PLAN>>  Arash needs to continue his O2, NEBS w/ Duoneb & Budes regularly; [name] wants to continue the BiPAP & we need to see if [name] meets Medicare's criteria for BiPAP device- proceed w/ Sleep Study...   ADDENDUM>>  Sleep Study 10/15/16>  AHI=1.8/hr (8 hypopneas & 4 RERAs;  AHI during REM = 17.6/hr;  Study done on 4LO2 & min O2sat= 88%;  Mod snoring noted;  No arrhythmias;  PLMS index= 37, but assoc arousals was only 0.2;  [name] appears to meet the criteria for BiPAP & we will submit to [employer]/ [employer]... Plan ROV recheck in 6-8 weeks.  ~  November 24, 2016:  6wks ROV and pulmonary follow up visit>  Imaad is stable w/ his severe disease- on BiPAP Qhs, Home O2- 24/7, NEB w/ Duoneb Qid & Budes Bid, [name] has rescue inhaler and [place] nasal mist;  His weight is all the way up to 257# (up 18# in 6wks) and c/o cough off & on, small amt yellow sput, no f/c/s, SOB "the same" but notes incr DOE w/ walking; unfortunately [name] has been very sedentary- not  exercising at all (prev in pulm rehab & they discharged him from the program)...     SEE PROBLEM LIST-- above... EXAM shows Afeb, VSS, O2sat=97% on 4L/min [place];  Wt=240#;  HEENT- neg, mallampati3;  Chest- decr BS bilat, otherw clear w/o w/r/r;  Heart- RR w/o m/r/g;  Abd- obese, soft;  Ext- neg w/o c/c/e.  LABS 11/24/16>  Chems- ok w/ BS=117, Cr=0.64, LFTs=wnl;  CBC- anemic w/ Hg=10.159mv=95, wbc-norm;  Fe=191 (sat=56%), Ferritin=109;  TSH=1.28...  Abd SONAR => ordered but never done...  Last CT Chest 08/19/16> mild cardiomeg w/ aortic & coronary atherosclerotic changes & PA enlargement w/ 3.4cm outflow tract; no adenopathy; severe bullous emphysema; bilat pulm nodules w/ 7.293mRUL nodule & 7.9 LLL lesion; Abd showssmall gallstones vs sludge & 6cm right renal cyst, mod thoracic spondylosis...  Last 2DEcho 09/29/16> mod conc LVH, norm LVF w/ EF=60-65%, no regional wall motion abn, Gr1 DD, norm valves, RV dil w/ norm sys function, PAsys=74 mmHg...  Last ONO > cannot find results IMP?PLAN>>  ErSalifas severe COPD/emphysema w/ pulmHTN & CorPulmonale; [name] refuses to fill inhalers due to costs & [name] is therefore on O2- 24/7, BiPAP nightly, NEBS w/ Duoneb Qid & Budes Bid, Mucinex 1200Bid, & asked to get into a regular exercise program;  [name] will continue regular f/u w/ CARDS- [name]; must work on weight reduction...  ~  January 27, 2017:  59m47moV & [name] was ADM by [facility] 3/7 - 01/26/17 w/ Acute on Chronic diastolic CHF, dilated cardiomyopathy, COPD exac, acute on chr resp failure;  [name] was diureses 30# of fluid 257# to 227# today but [name] is quite vehemently opposed to this being diastolicCHF and says it was pneumonia & wouldn't have happened if [name] had an antibiotic when [name] needed it (his words); Pulm consult from DrYacoub also remarked about pt being very upset at suggestion of CHF & demanded to see pulmonary...             ...Marland KitchenMarland Kitchen CXR 01/22/17 showed cardiomegaly, aortic atherosclerosis, bilat emphysema &  interstitial  prominence in the bases, no consolidation or effusion..  EKGs 01/2017 showed NSR, poor R prog V1-2, one PVC pair...  2DEcho 01/20/17 was a poor quality study  But showed mild conc LVH (prev mod), norm LVF w/ EF=55-60% & no RWMA, Ao root mildly dil, AoV mild calcif leaflets, MV norm, LAdil 67m, RV dil, norm RVF, PAsys- not meas (([id number]  LABS showed BS~130-180 range, Cr=0.8-1.1 range, Hg~10-11 range, WBC~10=>6K, Troponins- neg, BNP=35...    [name] was treated w/ [id number] placed on BiPAP, Oxygen, NEBS, Solumedrol, & empiric antibiotics; [name] was seen by Pulmonary & transitioned to oral Levaquin, Prednisone, & continued on his home regimen of Oxygen- 24/7, BiPAP Qhs, NEBS w/ DUONEB Qid & Budesonide Bid, Mucinex 12020mBid; Lasix trimmed to 4093md at disch yest... Since disch [name] is c/o "weak", notes some cough, sput, & SOB; we discussed keeping an Ab on hand so [name] can take it when [name] wants at the earliest sign of a resp infection- Levaquin 500m77mily #10 w/ refill written at his request, reminded to always take a probiotic like ALIGN & activia yogurt while on the broad spectrum antibiotics...     EXAM shows Afeb, VSS, O2sat=97% on 4L/min [place];  Wt=227#;  HEENT- neg, mallampati3;  Chest- decr BS bilat, otherw clear w/o w/r/r;  Heart- RR w/o m/r/g;  Abd- obese, soft;  Ext- neg w/o c/c/e. IMP/PLAN>>  ErneWeylin severe end-stage COPD- mixed chr bronchitis & emphysema w/ chr hypoxemia & hypercarbic resp failure, w/ Hx pulmHTN & right heart failure;  Now improved and approaching his baseline on max meds=> O2 at 2-4L/min 24/7 ([name] has [employer] that supplies 2L/min continuous or up to 6L/min pulse dose flow), nocturnal BiPAP, NEBs w/ Duoneb Qid & Budes 0.25Bid, Mucinex 1200mg23m weaning oral Prednisone from his recent hosp, finished oral Levaquin; on ASA81, Lasix40Bid, Avapro75, Imdur30, K20, Zocor20, and off prev Metoprolol... We discussed continuing the Pred taper til gone, continue other meds the same for now & ROV  recheck 49mo..39mote: [name] has been followed for Cards by [name] (HBP, CAD w/ IWMI 1995 & mult PCIs, HL) and last seen in 201`5 but [name] saw Shelden Raborn Richardson Dopp 5-04/2016...  ADDENDUM>> CT Chest 02/17/17 (28mo fo63mo up CT Chest- compare to 08/19/16)>> mild cadiomeg w/ atherosclerotic calcif in Ao/ great vessels/ coronaries; no enlarged adenopathy; extensive bullous emphysema, areas of mild interstitial prominence, stable nodular opacities (largest 1.3 x 0.9 cm in inferior LLL abutting the pleura) & no new parenchymal lung opacity; upper abd shows atherosclerotic calcif of Ao, right renal cyst, & an incompletely vis mass in mid abd=> needs CT Abd... ADDENDUM>> CT Abd & Pelvis 02/18/17 showed a large 25 x 16 x 18 cm mass in right abd mesentary, no adenopathy, additional findings include ao & coronary calcif, mod centrilob emphysema, diffuse hepatic steatosis, cholelithiasis & mod left colonic diverticulosis...   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    Pt notes that his abd has shrunk & his breathing  is sl better; still using 4-6L/min O2 & checks his pulse ox readings; [name] notes the O2 also helps his leg cramps "the nurse says it's oxygen deprivation" (using [facility]); [name] remains on BiPAP Qhs & rests well, wakes refreshed, denies daytime sleepiness;  [name] has been regular w/ his NEB rx using Duoneb Qid & Budesonide Bid + AlbutHFA rescue inhaler as needed...     EXAM shows Afeb, VSS, O2sat=99% on 4L/min [place];  Wt=227#;  HEENT- neg, mallampati3;  Chest- decr BS bilat, otherw clear w/o w/r/r;  Heart- RR w/o m/r/g;  Abd- protuberant abd, soft & nontender, norm BS;  Ext- VI, +1edema.  Last CXR was 01/22/17 showing cardiomegaly, aortic atherosclerosis, bilat emphysema & interstitial prominence in the bases, no consolidation or effusion..  Last CT as above - [facility] abd CT in August (after the XRT treatment)...  PFTs in Care Everywhere>  [facility] 04/28/17 showed FVC=2.60 (70%), FEV1=0.97 (36%), %1sec=37%...  Labs in Care everywhere 04/2017>  Chems- ok x BS=125, Cr=0.64, LFTs=wnl... 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  ~  October 10, 2017:  63moROV & ENikolaiindicates to me that [name] was told the abd tumor mass had not shrunk enough to consider surgery, and the Oncology team is holding off on ChemoRx until it's necessary for tumor growing, recently it has been about the same... From the pulmonary standpoint [name] feels that [name] is breathing OK- notes tired & sl SOB in the afternoon, decr cough & sl beige sput, no hemoptysis; [name] is NOT very active & getting [facility] for PT;  Using O2 at 4L/min & O2sat=94%... We reviewed the following NOTES is Epic Care  Everywhere>    [facility]- [name] has completed 25 fractions of XRT on 05/06/17, notes some improvement in breathing & has decr O2 to 5L/min; notes some decr abd & leg swelling;  CT Abd&Pelvis [facility]/24/17 at [facility]>  IMPRESSION: 1)Stable large heterogeneously enhancing mass in the right abdomen compatible with spindle cell neoplasm. 2)829mhyperenhancing lesion in the left hepatic lobe is too small to characterize but favored to represent a flash filling hemangioma or arterioportal shunt. 3)Stable subcentimeter pulmonary nodules as described above. Severe emphysema. Given smoking history, Fleischner guidelines recommend follow-up chest CT in 18 months. 4)Stable right hilar lymph node measuring 1.3 cm short axis, favor reactive. 5)(Hepatic steatosis. 6)Splenomegaly.  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 2) Progressively enlarging left adrenal nodule, currently measuring 9 mm in diameter. Given the avid enhancement and enlargement, this would be concerning for a potential site of metastasis. 3) Extensive pulmonary emphysema. Small pulmonary nodules are indeterminate, but show no interval change from the oldest available comparison CT examinations. Continued attention on follow-up. 4) Additional unchanged findings as noted above;  They rec f/u CT scans in 4 months & close observation... PT was ordered for the pt...    10/03/17 GENETIC Testing results>  Mr. Tajah Schreiner was  contacted by telephone today to discuss his negative genetic test results. No pathogenic mutations were detected on a 43-gene panel for evaluation of hereditary cancer. Genetic testing was pursued based on patient's extensive personal and family history of cancer.   We reviewed the following medical problems during today's office visit>     COPD, mixed type w/ emphysema>  [name] has GOLD Stage3 COPD & on O2 as noted, NEBS w/ DUONEB Qid & BUDESONIDE0.25Bid, plus ProairHFA prn when out & about; off the Pred Rx given last during his 09/2016 [facility] & tapered off...    Ex-smoker>  [name] quit late in 2016...    Chronic resp failure w/ hypoxemia and hypercarbia>  On above meds + Lasix40Bid, K20; also on O2 at 4L/min regularly & BiPAP Qhs=> need to derview download data...    Cor pulmonale, secondary pulm hypertension>  [name] is on O2 24/7 using 4L/min continuous at night & 4L/min pulse dose during the day...    Intra-abdominal SARCOMA>  See above- eval & management from [place], XRT, Surg Oncology...    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    MEDICAL issues>  HBP, HL, DM, obesity, gastritis, divertics, colon polyps, BPH, hx bladder cancer, DJD, anxiety  EXAM shows Afeb, VSS, O2sat=94% on 4L/min [place];  Wt=235#;  HEENT- neg, mallampati3;  Chest- decr BS bilat, otherw clear w/o w/r/r;  Heart- RR w/o m/r/g;  Abd- protuberant abd, soft & nontender, norm BS;  Ext- VI, +1edema.  LABS> pt reports labs from [facility] office recently were all OK... IMP/PLAN>>  I would like to see a BiPAP download, continue current meds the same & f/u scans as planned at [facility]; we will recheck pt in 38mo sooner if needed prn...  ~  January 10, 2018:  320moOV & pulmonary/ medical follow up visit> ErJerretteturns and indicates that [name] is doing better overall, feels that his breathing is at baseline but [name] arrives today in wheelchair w/  his O2 at 4L/min via POC- [name] continues on his NEB Qid, Budes Bid, nightly BiPAP & his oxygen;  We can't get download from LiEroshe says because Medicare says [name] didn't qualify & won't pay them?)  [name] notes appetitie is good, eating well [name] says;  [name] continues getting home PT from [facility]... We reviewed the following interval Epic/ Care Everywhere records>      [name]>  F/u primary intra-abd spindle-cell sarcoma, s/p XRT w/ 45Gy in 25 fragments  betw 5/17 - 05/06/17;  CT Abd&Pelvis [facility]/27/17 at [facility]>  CONCLUSION:  1) 1.5 cm left adrenal nodule, increased in size and concerning for metastasis.  2) 1.6 cm left hepatic lobe hypodensity measures slightly larger, concerning for malignancy. Consider liver MRI to further assess or attention on follow-up. Liver has a similar lobular contour and cirrhosis is not excluded.  3) 25 x 14 x 17 cm right lower quadrant spindle cell carcinoma is similar in size/appearance.  4) Left lung pleural-based or subpleural nodules are similar to slightly decreased. Prominent right hilar node is similar. Continued attention on follow up is advised.  5) 1.7 cm nodule arising from the thyroid isthmus, similar compared to prior. Consider thyroid ultrasound for further evaluation.  Oncology is considering IR- ablation of the liver & adrenal lesions... We reviewed the following medical problems during today's office visit>     COPD, mixed type w/ emphysema>  [name] has GOLD Stage3 COPD & on O2-4L/min as noted, NEBS w/ DUONEB Qid & BUDESONIDE0.25Bid, plus ProairHFA prn when out & about; off the Pred Rx given last during his 09/2016 [facility] & tapered off...    Ex-smoker>  [name] quit late in 2016...    Chronic resp failure w/ hypoxemia and hypercarbia>  On above meds + Lasix40Bid, K20; also on O2 at 4L/min regularly & BiPAP Qhs=> need to review download data...    Cor pulmonale, secondary pulm hypertension>  [name] is on O2 24/7 using 4L/min continuous at night &  4L/min pulse dose during the day...    Intra-abdominal SARCOMA w/ mets>  See above- eval & management from [place], XRT, Surg Oncology, Interventional Radiology...    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    MEDICAL issues>  HBP, HL, DM, obesity, gastritis, divertics, colon polyps, BPH, hx bladder cancer, DJD, anxiety EXAM shows Afeb, VSS, O2sat=94% on 4L/min [place];  Wt=235#;  HEENT- neg, mallampati3;  Chest- decr BS bilat, otherw clear w/o w/r/r;  Heart- RR w/o m/r/g;  Abd- protuberant abd, soft & nontender, norm BS;  Ext- VI, +1edema. IMP/PLAN>>  [name] is at baseline from the pulm standpoint- continue current Opxygen, BiPAP Qhs, Duoneb Qid & Budes Bid;  On-going management of his intra-abdominal sarcoma by [place] mutidisciplinary team...    ~  Apr 12, 2018:  30moROV & [name] states that his breathing is fair & stable on a regimen of DUONEB Qid, Budes0.25 via neb Bid, AlbutHFA rescue inhaler as needed;  [name] remains on O2 at 4L/min, BiPAP but we can't get download (Ace Ginssaid [name] didn't qualify & Medicare won't pay them);  [name] denies cough, notes sm amt green sput, no hemoptysis, SOB/DOE is about the same [name] says (eg- with walking but [name] tilled garden w/ tractor)...  We reviewed the following interval Epic/ Care Everywhere records>       [name] [facility]>  Impression:   1.Continued upward trend in the size of the left hepatic lobe hypodensity with rim enhancement which is likely a metastasis.  2.Similar size of the 1.5 cm left adrenal nodule concerning for metastasis. .[name] 3.Similar size and appearance of the large right lower quadrant spindle cell carcinoma. 4.No interval change in the pulmonary nodules as further described above. 5.Stable size of a prominent right hilar lymph node. 6.Similar size of the 1.7 cm hyperdense nodule arising the thyroid isthmus.  Consider  thyroid ultrasound for further evaluation.     CT OF THE ABDOMEN WITH INTRAVENOUS CONTRAST (CIRRHOSIS PROTOCOL) 03/06/18> Redemonstrated ill-defined hypodense lesion with peripheral enhancement involving the left hepatic lobe. As previously discussed, continued enlargement over multiple studies is concerning for metastatic involvement. No additional suspicious focal liver lesions appreciated.      [facility] at [facility]>  CBC- Hg=8.3, mcv=96, WBC=3.9, Fe=58 (19%sat), Ferritin=274;  Chems- K=4.4, BS=120, Cr=0.89, LFTs are wnl DrHolwerta transfussed him 2u packed cells (A+) on 01/18/18 when Hg=7.8.Marland KitchenMarland Kitchen [name] had FNA of thyroid nodule on 03/01/18 w/ PATH= benign follicular nodule...    [name] [facility] DrRaj- [facility] on [facility]/28/18>  His excellent summary note is reviewed; most recent CT (above) w/ incr size of liver met & new left adrenal met => refer to IR for consideration of Ablation...    [name] [facility]TDowning-[facility] to discuss Liver Ablation on 03/28/18>  Note reviewed, 2.5cm nodule in left hepatic lobe, and 1.4cm left adrenal nodule, hx prev XRT to the abd mass (45 Gy in 25 fractions completed 05/06/17);  They are planning percutaneous microwave ablation of the liver lesion and cryoablation of the left adrenal lesion We reviewed the following medical problems during today's office visit>     COPD, mixed type w/ emphysema>  [name] has GOLD Stage3 COPD & on O2-4L/min as noted, NEBS w/ DUONEB Qid & BUDESONIDE0.25Bid, plus ProairHFA prn when out & about; off the Pred Rx given last during his 09/2016 [facility] & tapered off...    Ex-smoker>  [name] quit late in 2016...    Chronic resp failure w/ hypoxemia and hypercarbia>  On above meds + Lasix40Bid, K20; also on O2 at 4L/min regularly & BiPAP Qhs=> need to review download data...    Cor pulmonale, secondary pulm hypertension>  [name] is on O2 24/7 using 4L/min continuous at night & 4L/min pulse dose during the day...    Intra-abdominal SARCOMA w/ mets>  See above- eval & management from [place], XRT, Surg Oncology, Interventional Radiology...    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    MEDICAL issues>  HBP, HL, DM, obesity, gastritis, divertics, colon polyps, BPH, hx bladder cancer, DJD, anxiety... His PCP is DrHolwerta. EXAM shows Afeb, VSS, O2sat=99% on 4L/min [place];  Wt=227#;  HEENT- neg, mallampati3;  Chest- decr BS bilat, otherw clear w/o w/r/r;  Heart- RR w/o m/r/g;  Abd- protuberant abd, soft & nontender, norm BS;  Ext- VI, +1edema. IMP/PLAN>>  [name] has on-going issues w/ the intra-abd sarcoma (s/p XRT at [facility]) & apparent mets to liver & adrenal (planned ablations by IR at [facility]); [name] was anemic & required Tx raising the concern for bleeding- [facility] are aware;  Continue same pulmonary Rx...          Problem List:  Hx of SINUSITIS (ICD-473.9) - off Nasonex now & recent Rx w/ Augmentin/ Saline... notes occas sinus congestion, drainage, sl HA, etc... [name] knows that [name] needs to quit smoking! ~  10/10: Adm for epistaxis w/ eval by DrWolicki- CXR= NAD, labs OK, rec for saline lavage- nose bleed resolved & disch home (no recurrence so far). ~  1/16: [name] presented w/ acute max & frontal sinusitis> Rx w/ Augmentin, Align, Saline, Mucinex, etc... ~  3/17: [name] has acute bronchitic exac- treated w/ Levaquin x7d + his regular meds...  COPD (ICD-496) >>   ~  on ADVAIR 250Bid; STILL SMOKES  CIGARETTES & [name]'s refused Chantix...  ~  [name] had hemoptysis 6/99 without lesion on CXR, CTChest, or Bronchoscopy... ~  Adm 10/10 w/ epistaxsis & eval DrWolicki as noted> [name] wanted to do Sleep Study but pt cancelled it & declined to resched. 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 ~  CXR 5/12 showed ectatic calcif & tort Aorta, COPD w/ incr markings ar bases, NAD, +degen spondylosis. ~  CXR 6/13 showed COPD/ emphysema, scarring in the lower lobes, otherw clear lungs, DJD in  spine... ~  CXR 7/14 showed COPD/ emphysema, incr markings and scarring at the bases, NAD. ~  1/15: [name] says [name]'s decr his smoking from prev 3-4ppd down to 3 cig/d;  [name] remains on Advair250> states breathing is ok, at baseline CAT score=12; baseline cough, sput, no hemoptysis, stable DOE, no edema... ~  CXR 7/15 showed underlying COPD, mild bibasilar fibrosis, norm heart size, DJD in spine, NAD...  ~  PFT 7/15 showed> FVC=3.18 (78%), FEV1=1.22 (38%), %1sec=38; mid-flows=13% predicted... This is c/w GOLD Stage3 COPD and we discussed this> [name] is rec to quit all smoking immediately; [name] has been using the nicotine gum & rec to try patches, offered Wellbutrin, [name] refuses Chantix; needs to continue ADVAIR250Bid & samples givem; [name] declines to start [place] at this time due to cost & donut hole (we will address this again on return); reminded to use MUCINEX 665m- 1to2 Bid w/ fluids, and OK to use OTC DELSYM as needed for cough...   ~  1/16: [name] claims stable on Advair250 but still smoking & can't vs won't quit; we decided to add Spiriva daily via handihaler... ~  7/16: on Advair250Bid & Spiriva daily; symptoms as above, [name] is stoic, still smoking 1ppd & NOT motivated to quit, asked to cut back & use both inhalers regularly;  We reviewed the low-dose screening CT Chest program for the early detection of lung cvancer- [name] will consider this & let me know his decision... ~  12/16:  [name], COPD exac, and found to have pulmHTN w/ 2DEcho showing PAsys=51;  Treated w/ O2, Pred taper, antibiotics, NEBS, Advair/Spiriva, etc;  [name] says [name] has quit smoking... ~  [facility] CXR- COPD, bullous dis, incr markings at bases, NAD; Cultures of blood/ sput/ urine were all neg; Serologies for strep & legionella were neg; Viral panel was neg; [name] was treated w/ Oxygen, IV Solumedrol, Rocephin/ Zithromax, NEBS, Lovenox, flutter, etc; [name] was disch on Home O2- 2L/min activ & 1L/min rest, Pred  taper, NEBS, Levaquin, Advair250/ Spiriva => improved. ~  3/17:  severe obstructive lung dis, GOLD Stage 3, chr hypoxemic resp failure w/ pulmHTN (WHO group3); [name] quit smoking 10/2015 & is using home O2 3L/min, NEBS w/ Albut tid, Advair250Bid, Spiriva daily, Mucinex etc; [name] has acute bronchitis exac- Rx w/ Levaquin x7d, continue Home O2 24/7 and push to start Pulm Rehab...   [name] HAS ESTABLISHED PRIMARY CARE w/ [place]ilburt [place]/ [name] et al >>   HYPERTENSION (ICD-401.9) - controlled on METOPROLOL 221mid,  NORVASC 1033m, LISINOPRIL 59m48m..   ~  11/11:  BP=122/70, not checking BP's at home... denies HA, fatigue, visual changes, CP, palipit, dizziness, syncope, dyspnea, edema, etc. ~  5/12:  BP= 120/72, pt very pleased w/ his wt loss & improved exercise ability. ~  6/13:  BP= 122/64 & feeling well w/o CP, palpit, SOB, edema, etc... ~  12/13:  BP=122/82 & [name] denies CP, palpit,  SOB, edema... ~  7/14: on Metop25-3/d, Amlod10, Lisin40; BP=130/78 & [name] denies CP, palpit, SOB, edema ~  1/15: BP remains stable on Metop25-2AM & 1PM, Amlod5, Lisin40; BP= 120/72 and [name] denies CP, palpit, ch in SOB, edema. ~  2016> His meds have been adjusted- now on Rx w/ Amlod10, Lisin10; BP= 130/72 7 [name] denies CP, palpit, edema; chr SOB/DOE due to his COPD.  CORONARY ARTERY DISEASE (ICD-414.00) - ASA 53m/d & off Plavix as of 2/13 officially as [name] wasn't taking it regularly anyway. ~  Hx prev IWMI and PTCA/ stent in RCA by DrBrodie 12/95 & 5/96...  ~  Myoview 4/08 abnormal w/ inferior ischemia and subseq cath revealing restenoses in RCA- s/p repeat PTCA/ stents...  ~  f/u Myoview 9/08 w/ prev infer infarct, no ischemia, EF=56%... ~  9/11:  pre op cardiac eval DrBrodie> OK to hold ASA/ Plavix for bladder surg. 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 ~  2/13:  [name] had f/u [name] w/ Myoview showing a fixed defect in  infer wall, no ischemia, EF=58%; pt wasn't taking Plavix, therefore stopped in favor of ASA849md... ~  3/14:  [name] had f/u [name]> CAD, IWMI 1995, mult PCIs, last Myoview 2013 was low risk; too sedentary, no changes made; 7361/o brother died recently w/ AAA rupture... ~  EKG 3/14 showed NSR, rate65, WNL, NAD... ~  [name] maintains f/u w/ CARDS, [name]- known CAD, Hx inferior MI 1995, s/p mult PCIs- most recent 2009; finally qit smoking 12/16 [facility]/ [facility]; on ASA, statin, Amlod & Lisin.  HYPERCHOLESTEROLEMIA (Mixed Hyperlipidemia) - prev on Simva80 but DrBrodie decr him to SIMVASTATIN 2080m + Fish Oil & Red Wine qd... ~  [place] Simva40 showed TChol 151, TG 106, HDL 23, LDL 107 ~  FLP 8/09 on Simva80 showed TChol 142, TG 135, HDL 28, LDL 87 ~  FLP 6/10 on Simva80 showed TChol 151, TG 117, HDL 31, LDL 97 ~  FLP in hosp 10/10 showed TChol 128, TG 110, HDL 26, LDL 80 ~  FLP 5/11 on Simva80 showed TChol 144, TG 215, HDL 26, LDL 89 ~  9/11:  DrBrodie decr him to Simva20... we discussed the need for better diet & wt reduction. ~  FLP 5/12 on Simva20 showed TChol 162, TG 159, HDL 32, LDL 98 ~  FLP 6/13 on Simva20 showed TChol 130, TG 102, HDL 33, LDL 77... rec to incr exercise program & get wt down! ~  FLP 7/14 on Simva20 showed TChol 158, TG 145, HDL 33, LDL 96... [name] has gained way too much wt 7 must get on diet! ~  Followed by PCP- [name]...  DIABETES MELLITUS, BORDERLINE (ICD-790.29) - on diet alone... ~  labs 9/08 showed BS=100, HgA1c=6.1 ~  labs 8/09 showed BS= 107, A1c= 6.2 ~  labs 6/10 showed BS= 108, A1c= 6.2 ~  lab 5/11 (wt=244#) showed BS= 121. A1c= 6.7... Marland Kitchene was told "get wt down, or start meds". ~  Labs 5/12 showed BS= 109, A1c= 6.1 ~  Labs 6/13 showed BS= 114, & [name] needs to lose the weight... ~  Labs 7/14 on diet alone showed BS= 120, A1c= 6.7 ~  Followed by PCP- [name]...  OBESITY (ICD-278.00) - weight was down as low as 202# in 2/09, and steadily  incr to 244# by 5/11... ~  weight 5/11 = 244#... Marland Kitchenwe reviewed  diet + exercise necessary to lose weight... ~  weight 11/11 = 217# (after dx & rx for bladder cancer) ~  Weight 5/12 = 214# but says it's 192# at home... ~  Weight 6/13 = 211# but knows it's better since his pant/ belt is down to 36" (under his roll). ~  Weight 7/14 = 230# but [name] has quit smoking [name] says, now must lose the weight... ~  Weight 1/15 = 229# ~  Weight 1/17 = 216# ~  Weight 3/17 = 231# and we reviewed diet, exercise, wt reducing strategies...  GASTRITIS (ICD-535.50) - had EGD 10/05 showing gastritis... on RANITADINE 34m/d due to his Plavix Rx, off prev Omep.  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 ~  4/12:  Pt reported f/u cysto was neg, doing well... ~  10/12:  Note from [name] reviewed> cysto was neg, no recurrent tumors... ~  4/13:  Note from [name] reviewed> neg f/u cysto w/o recurrent TCCa... [name] wants to continue Q6mo53motoscopies... ~  1/14:  [name] had f/u w/ [name]> Hx TCCa bladder, s/p TURBT 9/11, given BCG 11/11-12/11 and f/u Cysto 1/12 was neg; repeat cysto 1/14 was neg as well...  DEGENERATIVE JOINT DISEASE (ICD-715.90) - [name] has c/o right shoulder pain, hip pain, knee pain, and LBP> Rx ADVIL w/ some benefit... offered Ortho referral & [name] will decide.  ANXIETY (ICD-300.00) - on ALPRAZOLAM 0.5mg 33m...   Past Surgical History:  Procedure Laterality Date  . cataract surgery  septemeber 2013  . cataract surgery/sub posterior vitrectomy in left eye  1996   Dr. RankiZadie RhineTCA  [phone] Encounter Medications as  of 04/12/2018  Medication Sig  . Albuterol Sulfate (PROAIR RESPICLICK) 108 (410Base) MCG/ACT AEPB Inhale 2 puffs into the lungs every 6 (six) hours as needed (shortness of breath).  . ALPRAZolam (XANAX) 0.5 MG tablet Take 0.5 mg by mouth at bedtime.   . budesonide (PULMICORT) 0.25 MG/2ML nebulizer solution Take 2 mLs (0.25 mg total) by nebulization 2 (two) times daily.  . cholecalciferol (VITAMIN D) 400 units TABS tablet Take 400 Units by mouth at bedtime.  . furosemide (LASIX) 40 MG tablet Take 40 mg by mouth daily.   . iprMarland Kitchentropium-albuterol (DUONEB) 0.5-2.5 (3) MG/3ML SOLN Take 3 mLs by nebulization 4 (four) times daily.  . multivitamin-lutein (OCUVITE-LUTEIN) CAPS capsule Take 1 capsule by mouth every evening.   . nitroGLYCERIN (NITROSTAT) 0.4 MG SL tablet Place 1 tablet (0.4 mg total) under the tongue every 5 (five) minutes as needed for chest pain.  . PolVladimir Fasterol-Propyl Glycol (SYSTANE OP) Apply 1 drop to eye daily as needed (dry eyes).  . simvastatin (ZOCOR) 20 MG tablet Take 1 tablet (20 mg total) by mouth daily at 6 PM.  . sodium chloride (OCEAN) 0.65 % SOLN nasal spray Place 1 spray into both nostrils as needed for congestion.  . vitamin B-12 (CYANOCOBALAMIN) 1000 MCG tablet Take 1,000 mcg by mouth daily.  . [DISCONTINUED] Ascorbic Acid (VITAMIN C) 1000 MG tablet Take 1,000 mg by mouth every evening.   . [DISCONTINUED] aspirin 81 MG tablet Take 81 mg  by mouth at bedtime.   . [DISCONTINUED] CINNAMON PO Take 1,000 mg by mouth every evening.   . [DISCONTINUED] Flaxseed, Linseed, (FLAX SEED OIL PO) Take 1,400 mg by mouth daily.  . [DISCONTINUED] Garlic 0354 MG CAPS Take 1,000 mg by mouth every evening.   . [DISCONTINUED] ibuprofen (ADVIL,MOTRIN) 600 MG tablet Take 600 mg by mouth every 6 (six) hours as needed for moderate pain.  . [DISCONTINUED] isosorbide mononitrate (IMDUR) 30 MG 24 hr tablet Take 1 tablet (30 mg total) by mouth daily.  . [DISCONTINUED] lisinopril (PRINIVIL,ZESTRIL)  10 MG tablet Take 10 mg by mouth daily.  . [DISCONTINUED] Multiple Vitamin (MULTIVITAMIN WITH MINERALS) TABS tablet Take 1 tablet by mouth daily.  . [DISCONTINUED] Omega-3 Fatty Acids (FISH OIL) 1000 MG CPDR Take 1,000 mg by mouth daily.  . [DISCONTINUED] potassium chloride SA (K-DUR,KLOR-CON) 20 MEQ tablet Take 1 tablet (20 mEq total) by mouth daily. (Patient not taking: Reported on 05/13/2018)   No facility-administered encounter medications on file as of 04/12/2018.     Allergies  Allergen Reactions  . Amlodipine Swelling    Current Medications, Allergies, Past Medical History, Past Surgical History, Family History, and Social History were reviewed in [employer] record.    Review of Systems        See HPI - all other systems neg except as noted... The patient complains of some dyspnea on exertion.  The patient denies anorexia, fever, weight loss, weight gain, vision loss, decreased hearing, hoarseness, chest pain, syncope, peripheral edema, prolonged cough, headaches, hemoptysis, abdominal pain, melena, hematochezia, severe indigestion/heartburn, hematuria, incontinence, muscle weakness, suspicious skin lesions, transient blindness, difficulty walking, depression, unusual weight change, abnormal bleeding, enlarged lymph nodes, and angioedema.     Objective:   Physical Exam    WD, Obese, 72 y/o WM in NAD... Vital Signs:  Reviewed... GENERAL:  Alert & oriented; pleasant & cooperative... HEENT:  [place]/AT, EOM-wnl, PERRLA, EACs-clear, TMs-wnl, NOSE- sl congestion, THROAT-clear & wnl. NECK:  Supple w/ fairROM; no JVD; normal carotid impulses w/o bruits; no thyromegaly or nodules palpated; no lymphadenopathy. CHEST:  decr BS at bases bilat w/ few scat rhonchi, no wheezing/ rales/ or signs of consolidation... HEART:  Regular Rhythm; without murmurs/ rubs/ or gallops detected... ABDOMEN:  Obese, soft, & nontender; sm knot palpated in mid abd above umbilicus; normal bowel  sounds; no organomegaly detected. EXT: without deformities, mild arthritic changes; no varicose veins/ +venous insuffic/ no edema. NEURO:  CN's intact; no focal neuro deficits... DERM:  No lesions noted; no rash etc...  RADIOLOGY DATA:  Reviewed in the EPIC EMR & discussed w/ the patient...  LABORATORY DATA:  Reviewed in the EPIC EMR & discussed w/ the patient...   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  11/21/15>  referred for PulmRehab, contact [place] re- POC, [name] still needs Full PFTs & CT Chest... We plan ROV in 6 wks 02/02/16> We discussed continuing the NEBS Tid followed by Advair250Bid & Spiriva daily; add Mucinex 1238mBid & we will Rx w/ Levaquin500/d x7d for his bronchitic infection (see AVS);  [name] still needs a CT  Chest but wants to wait for now;  Needs to get into the [place] program ASAP, and [name] wants diff DME supplier- we will inquire, plan ROV 6mo6/21/17>   The AMalabarwere too $$ so we switched to NEBS w/ Duoneb Qid & Budes Bid; added ProairHFA prn; [name] desperately needs to lose wt in conjunction w/ his PulmRehab etc... 08/05/16>   [name] presents w/ a COPD exac-- Rx w/ Levaquin, Pred20-4d taper + NEBS w/ duoneb Qid & budes Bid... 11/24/16>   EJaquelyn Bitterhas severe COPD/emphysema w/ pulmHTN & CorPulmonale; [name] refuses to fill inhalers due to costs & [name] is therefore on O2- 24/7, BiPAP nightly, NEBS w/ Duoneb Qid & Budes Bid, Mucinex 1200Bid, & asked to get into a regular exercise program;  [name] will continue regular f/u w/ [facility]- [name]; must work on weight reduction... 01/27/17>   EMelesiohas severe end-stage  COPD- mixed chr bronchitis & emphysema w/ chr hypoxemia & hypercarbic resp failure, w/ Hx pulmHTN & right heart failure; s/p Hosp & now improved and approaching his baseline on max meds=> O2 at 2-4L/min 24/7 ([name] has [employer] that supplies 2L/min continuous or up to 6L/min pulse dose flow), nocturnal BiPAP, NEBs w/ Duoneb Qid & Budes 0.25Bid, Mucinex 120423mid, weaning oral Prednisone from his recent hosp, finished oral Levaquin; on ASA81, Lasix40Bid, Avapro75, Imdur30, K20, Zocor20, and off prev Metoprolol... 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 10/10/17>   I would like to see a BiPAP download, continue current meds the same & [facility] recheck pt in 55mo50mooner if needed prn... 01/10/18>   [name] is at baseline from the pulm standpoint- continue current Opxygen, BiPAP Qhs, Duoneb Qid & Budes Bid;  On-[facility]abdominal [facility]>   [name] has on-going issues w/ the intra-abd sarcoma ([facility] ([facility] & required Tx raising the concern for bleeding- [facility] are aware;  Continue same pulmonary Rx.   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   AMOXICILLIN-CLAVULANATE (AUGMENTIN) 875-125 MG TABLET    Take 1 tablet by mouth 2 (two) times daily.   GUAIFENESIN (MUCINEX) 600 MG 12 HR TABLET  Take 2 tablets (1,200 mg total) by mouth 2 (two) times daily.   ONDANSETRON (ZOFRAN) 4 MG TABLET    Take 1 tablet (4 mg total) by mouth every 6 (six) hours as needed for nausea.   PANTOPRAZOLE (PROTONIX) 40 MG TABLET    Take 1 tablet (40 mg total) by mouth daily.  Previous Medications   ALBUTEROL SULFATE (PROAIR RESPICLICK) 169 (90 BASE) MCG/ACT AEPB    Inhale 2 puffs into the lungs every 6 (six) hours as needed (shortness of breath).   ALPRAZOLAM (XANAX) 0.5 MG TABLET    Take 0.5 mg by mouth at bedtime.    BUDESONIDE (PULMICORT) 0.25 MG/2ML NEBULIZER SOLUTION    Take 2 mLs (0.25 mg total) by nebulization 2 (two) times daily.   CHOLECALCIFEROL (VITAMIN D) 400 UNITS TABS TABLET    Take 400 Units by mouth at bedtime.   FUROSEMIDE (LASIX) 40 MG TABLET    Take 40 mg by mouth daily.    IPRATROPIUM-ALBUTEROL (DUONEB) 0.5-2.5 (3) MG/3ML SOLN    Take 3 mLs by nebulization 4 (four) times daily.   METHOCARBAMOL (ROBAXIN) 500 MG TABLET    TAKE 1 TO 2 TABLETS BY MOUTH TWICE A DAY AS NEEDED FOR MUSCLE CRAMPS   MULTIVITAMIN-LUTEIN (OCUVITE-LUTEIN) CAPS CAPSULE    Take 1 capsule by mouth every evening.    NITROGLYCERIN (NITROSTAT) 0.4 MG SL TABLET    Place 1 tablet (0.4 mg total) under the tongue every 5 (five) minutes as needed for chest pain.   POLYETHYL GLYCOL-PROPYL GLYCOL (SYSTANE OP)    Apply 1 drop to eye daily as needed (dry eyes).   POTASSIUM 99 MG TABS    Take 1 tablet by mouth 2 (two) times daily.   SIMVASTATIN (ZOCOR) 20 MG TABLET    Take 1 tablet (20 mg total) by mouth  daily at 6 PM.   SODIUM CHLORIDE (OCEAN) 0.65 % SOLN NASAL SPRAY    Place 1 spray into both nostrils as needed for congestion.   VITAMIN B-12 (CYANOCOBALAMIN) 1000 MCG TABLET    Take 1,000 mcg by mouth daily.  Modified Medications   No medications on file  Discontinued Medications   ASCORBIC ACID (VITAMIN C) 1000 MG TABLET    Take 1,000 mg by mouth every evening.    ASPIRIN 81 MG TABLET    Take 81 mg by mouth at bedtime.    CINNAMON PO    Take 1,000 mg by mouth every evening.    FLAXSEED, LINSEED, (FLAX SEED OIL PO)    Take 1,400 mg by mouth daily.   GARLIC 4503 MG CAPS    Take 1,000 mg by mouth every evening.    IBUPROFEN (ADVIL,MOTRIN) 600 MG TABLET    Take 600 mg by mouth every 6 (six) hours as needed for moderate pain.   ISOSORBIDE MONONITRATE (IMDUR) 30 MG 24 HR TABLET    Take 1 tablet (30 mg total) by mouth daily.   LISINOPRIL (PRINIVIL,ZESTRIL) 10 MG TABLET    Take 10 mg by mouth daily.   MULTIPLE VITAMIN (MULTIVITAMIN WITH MINERALS) TABS TABLET    Take 1 tablet by mouth daily.   OMEGA-3 FATTY ACIDS (FISH OIL) 1000 MG CPDR    Take 1,000 mg by mouth daily.   POTASSIUM CHLORIDE SA (K-DUR,KLOR-CON) 20 MEQ TABLET    Take 1 tablet (20 mEq total) by mouth daily.  NOTE>> Advair & Spiriva discontinued due to cost & replaced by DUONEB-Qid & PULMICORT 0,47mBid via NEBULIZER..Marland KitchenMarland Kitchen

## 2018-04-17 DIAGNOSIS — J9612 Chronic respiratory failure with hypercapnia: Secondary | ICD-10-CM | POA: Diagnosis not present

## 2018-04-17 DIAGNOSIS — C7972 Secondary malignant neoplasm of left adrenal gland: Secondary | ICD-10-CM | POA: Diagnosis not present

## 2018-04-17 DIAGNOSIS — F419 Anxiety disorder, unspecified: Secondary | ICD-10-CM | POA: Diagnosis not present

## 2018-04-17 DIAGNOSIS — C762 Malignant neoplasm of abdomen: Secondary | ICD-10-CM | POA: Diagnosis not present

## 2018-04-17 DIAGNOSIS — G4733 Obstructive sleep apnea (adult) (pediatric): Secondary | ICD-10-CM | POA: Diagnosis not present

## 2018-04-17 DIAGNOSIS — D649 Anemia, unspecified: Secondary | ICD-10-CM | POA: Diagnosis not present

## 2018-04-17 DIAGNOSIS — I252 Old myocardial infarction: Secondary | ICD-10-CM | POA: Diagnosis not present

## 2018-04-17 DIAGNOSIS — J9611 Chronic respiratory failure with hypoxia: Secondary | ICD-10-CM | POA: Diagnosis not present

## 2018-04-17 DIAGNOSIS — Z87891 Personal history of nicotine dependence: Secondary | ICD-10-CM | POA: Diagnosis not present

## 2018-04-17 DIAGNOSIS — C494 Malignant neoplasm of connective and soft tissue of abdomen: Secondary | ICD-10-CM | POA: Diagnosis not present

## 2018-04-17 DIAGNOSIS — J439 Emphysema, unspecified: Secondary | ICD-10-CM | POA: Diagnosis not present

## 2018-04-17 DIAGNOSIS — I10 Essential (primary) hypertension: Secondary | ICD-10-CM | POA: Diagnosis not present

## 2018-04-17 DIAGNOSIS — I2510 Atherosclerotic heart disease of native coronary artery without angina pectoris: Secondary | ICD-10-CM | POA: Diagnosis not present

## 2018-04-24 DIAGNOSIS — J9612 Chronic respiratory failure with hypercapnia: Secondary | ICD-10-CM | POA: Diagnosis not present

## 2018-04-24 DIAGNOSIS — C494 Malignant neoplasm of connective and soft tissue of abdomen: Secondary | ICD-10-CM | POA: Diagnosis not present

## 2018-04-24 DIAGNOSIS — I2510 Atherosclerotic heart disease of native coronary artery without angina pectoris: Secondary | ICD-10-CM | POA: Diagnosis not present

## 2018-04-24 DIAGNOSIS — C7972 Secondary malignant neoplasm of left adrenal gland: Secondary | ICD-10-CM | POA: Diagnosis not present

## 2018-04-24 DIAGNOSIS — J9611 Chronic respiratory failure with hypoxia: Secondary | ICD-10-CM | POA: Diagnosis not present

## 2018-04-24 DIAGNOSIS — C762 Malignant neoplasm of abdomen: Secondary | ICD-10-CM | POA: Diagnosis not present

## 2018-05-08 ENCOUNTER — Encounter: Payer: Self-pay | Admitting: Gastroenterology

## 2018-05-13 ENCOUNTER — Other Ambulatory Visit: Payer: Self-pay

## 2018-05-13 ENCOUNTER — Encounter (HOSPITAL_COMMUNITY): Payer: Self-pay | Admitting: Emergency Medicine

## 2018-05-13 ENCOUNTER — Emergency Department (HOSPITAL_COMMUNITY): Payer: Medicare Other

## 2018-05-13 ENCOUNTER — Inpatient Hospital Stay (HOSPITAL_COMMUNITY)
Admission: EM | Admit: 2018-05-13 | Discharge: 2018-05-18 | DRG: 378 | Disposition: A | Discharge status: Home / Self Care | Payer: Medicare Other | Attending: Internal Medicine | Admitting: Internal Medicine

## 2018-05-13 DIAGNOSIS — I110 Hypertensive heart disease with heart failure: Secondary | ICD-10-CM | POA: Diagnosis present

## 2018-05-13 DIAGNOSIS — D539 Nutritional anemia, unspecified: Secondary | ICD-10-CM | POA: Diagnosis not present

## 2018-05-13 DIAGNOSIS — I2510 Atherosclerotic heart disease of native coronary artery without angina pectoris: Secondary | ICD-10-CM | POA: Diagnosis not present

## 2018-05-13 DIAGNOSIS — F419 Anxiety disorder, unspecified: Secondary | ICD-10-CM | POA: Diagnosis present

## 2018-05-13 DIAGNOSIS — Z6837 Body mass index (BMI) 37.0-37.9, adult: Secondary | ICD-10-CM

## 2018-05-13 DIAGNOSIS — K922 Gastrointestinal hemorrhage, unspecified: Secondary | ICD-10-CM | POA: Diagnosis not present

## 2018-05-13 DIAGNOSIS — J9611 Chronic respiratory failure with hypoxia: Secondary | ICD-10-CM | POA: Diagnosis present

## 2018-05-13 DIAGNOSIS — D62 Acute posthemorrhagic anemia: Secondary | ICD-10-CM | POA: Diagnosis present

## 2018-05-13 DIAGNOSIS — Z955 Presence of coronary angioplasty implant and graft: Secondary | ICD-10-CM | POA: Diagnosis not present

## 2018-05-13 DIAGNOSIS — R031 Nonspecific low blood-pressure reading: Secondary | ICD-10-CM | POA: Diagnosis not present

## 2018-05-13 DIAGNOSIS — R7303 Prediabetes: Secondary | ICD-10-CM | POA: Diagnosis present

## 2018-05-13 DIAGNOSIS — E669 Obesity, unspecified: Secondary | ICD-10-CM | POA: Diagnosis present

## 2018-05-13 DIAGNOSIS — Z9981 Dependence on supplemental oxygen: Secondary | ICD-10-CM

## 2018-05-13 DIAGNOSIS — E875 Hyperkalemia: Secondary | ICD-10-CM | POA: Diagnosis not present

## 2018-05-13 DIAGNOSIS — Z8551 Personal history of malignant neoplasm of bladder: Secondary | ICD-10-CM | POA: Diagnosis not present

## 2018-05-13 DIAGNOSIS — Z87891 Personal history of nicotine dependence: Secondary | ICD-10-CM | POA: Diagnosis not present

## 2018-05-13 DIAGNOSIS — K5731 Diverticulosis of large intestine without perforation or abscess with bleeding: Principal | ICD-10-CM | POA: Diagnosis present

## 2018-05-13 DIAGNOSIS — J449 Chronic obstructive pulmonary disease, unspecified: Secondary | ICD-10-CM | POA: Diagnosis not present

## 2018-05-13 DIAGNOSIS — Z7951 Long term (current) use of inhaled steroids: Secondary | ICD-10-CM

## 2018-05-13 DIAGNOSIS — E7800 Pure hypercholesterolemia, unspecified: Secondary | ICD-10-CM | POA: Diagnosis present

## 2018-05-13 DIAGNOSIS — C494 Malignant neoplasm of connective and soft tissue of abdomen: Secondary | ICD-10-CM | POA: Diagnosis present

## 2018-05-13 DIAGNOSIS — K625 Hemorrhage of anus and rectum: Secondary | ICD-10-CM

## 2018-05-13 DIAGNOSIS — D649 Anemia, unspecified: Secondary | ICD-10-CM

## 2018-05-13 DIAGNOSIS — C679 Malignant neoplasm of bladder, unspecified: Secondary | ICD-10-CM | POA: Diagnosis not present

## 2018-05-13 DIAGNOSIS — G4733 Obstructive sleep apnea (adult) (pediatric): Secondary | ICD-10-CM | POA: Diagnosis present

## 2018-05-13 DIAGNOSIS — Z7982 Long term (current) use of aspirin: Secondary | ICD-10-CM

## 2018-05-13 DIAGNOSIS — I959 Hypotension, unspecified: Secondary | ICD-10-CM | POA: Diagnosis not present

## 2018-05-13 DIAGNOSIS — M1990 Unspecified osteoarthritis, unspecified site: Secondary | ICD-10-CM | POA: Diagnosis present

## 2018-05-13 DIAGNOSIS — J9612 Chronic respiratory failure with hypercapnia: Secondary | ICD-10-CM | POA: Diagnosis not present

## 2018-05-13 DIAGNOSIS — I420 Dilated cardiomyopathy: Secondary | ICD-10-CM | POA: Diagnosis present

## 2018-05-13 DIAGNOSIS — R933 Abnormal findings on diagnostic imaging of other parts of digestive tract: Secondary | ICD-10-CM | POA: Diagnosis not present

## 2018-05-13 DIAGNOSIS — I5032 Chronic diastolic (congestive) heart failure: Secondary | ICD-10-CM | POA: Diagnosis present

## 2018-05-13 DIAGNOSIS — Z79899 Other long term (current) drug therapy: Secondary | ICD-10-CM

## 2018-05-13 DIAGNOSIS — K5730 Diverticulosis of large intestine without perforation or abscess without bleeding: Secondary | ICD-10-CM | POA: Diagnosis not present

## 2018-05-13 LAB — COMPREHENSIVE METABOLIC PANEL
ALBUMIN: 3.5 g/dL (ref 3.5–5.0)
ALT: 38 U/L (ref 0–44)
ANION GAP: 8 (ref 5–15)
AST: 37 U/L (ref 15–41)
Alkaline Phosphatase: 43 U/L (ref 38–126)
BUN: 20 mg/dL (ref 8–23)
CHLORIDE: 103 mmol/L (ref 98–111)
CO2: 27 mmol/L (ref 22–32)
CREATININE: 0.91 mg/dL (ref 0.61–1.24)
Calcium: 8.5 mg/dL — ABNORMAL LOW (ref 8.9–10.3)
GFR calc non Af Amer: >60 mL/min (ref >60)
Glucose, Bld: 156 mg/dL — ABNORMAL HIGH (ref 70–99)
Potassium: 4.4 mmol/L (ref 3.5–5.1)
SODIUM: 138 mmol/L (ref 135–145)
Total Bilirubin: 1.4 mg/dL — ABNORMAL HIGH (ref 0.3–1.2)
Total Protein: 6.7 g/dL (ref 6.5–8.1)

## 2018-05-13 LAB — VITAMIN B12: VITAMIN B 12: 1000 pg/mL — AB (ref 180–914)

## 2018-05-13 LAB — CBC
HCT: 24.1 % — ABNORMAL LOW (ref 39.0–52.0)
HEMATOCRIT: 21.4 % — AB (ref 39.0–52.0)
HEMOGLOBIN: 7.5 g/dL — AB (ref 13.0–17.0)
Hemoglobin: 6.8 g/dL — CL (ref 13.0–17.0)
MCH: 31.5 pg (ref 26.0–34.0)
MCH: 32.1 pg (ref 26.0–34.0)
MCHC: 31.1 g/dL (ref 30.0–36.0)
MCHC: 31.8 g/dL (ref 30.0–36.0)
MCV: 101.3 fL — AB (ref 78.0–100.0)
MCV: 100.9 fL — ABNORMAL HIGH (ref 78.0–100.0)
PLATELETS: 221 10*3/uL (ref 150–400)
Platelets: 217 10*3/uL (ref 150–400)
RBC: 2.38 MIL/uL — AB (ref 4.22–5.81)
RBC: 2.12 MIL/uL — ABNORMAL LOW (ref 4.22–5.81)
RDW: 20 % — ABNORMAL HIGH (ref 11.5–15.5)
RDW: 20.3 % — AB (ref 11.5–15.5)
WBC: 8 10*3/uL (ref 4.0–10.5)
WBC: 9.5 10*3/uL (ref 4.0–10.5)

## 2018-05-13 LAB — IRON AND TIBC
Iron: 137 ug/dL (ref 45–182)
Saturation Ratios: 51 % — ABNORMAL HIGH (ref 17.9–39.5)
TIBC: 270 ug/dL (ref 250–450)
UIBC: 133 ug/dL

## 2018-05-13 LAB — FOLATE: Folate: 23.1 ng/mL (ref >5.9)

## 2018-05-13 LAB — PREPARE RBC (CROSSMATCH)

## 2018-05-13 LAB — ABO/RH: ABO/RH(D): A POS

## 2018-05-13 LAB — TROPONIN I

## 2018-05-13 LAB — FERRITIN: Ferritin: 310 ng/mL (ref 24–336)

## 2018-05-13 LAB — POC OCCULT BLOOD, ED: FECAL OCCULT BLD: POSITIVE — AB

## 2018-05-13 LAB — RETICULOCYTES
RBC.: 2.11 MIL/uL — AB (ref 4.22–5.81)
RETIC COUNT ABSOLUTE: 154 10*3/uL (ref 19.0–186.0)
RETIC CT PCT: 7.3 % — AB (ref 0.4–3.1)

## 2018-05-13 MED ORDER — IOPAMIDOL (ISOVUE-300) INJECTION 61%
100.0000 mL | Freq: Once | INTRAVENOUS | Status: AC | PRN
  Administered 2018-05-13: 100 mL via INTRAVENOUS

## 2018-05-13 MED ORDER — ACETAMINOPHEN 650 MG RE SUPP: 650.0000 mg | Freq: Four times a day (QID) | RECTAL | Status: DC | PRN

## 2018-05-13 MED ORDER — SIMVASTATIN 10 MG PO TABS
20.0000 mg | ORAL_TABLET | Freq: Every day | ORAL | Status: DC
  Administered 2018-05-13 – 2018-05-14 (×2): 20 mg via ORAL
  Filled 2018-05-13 (×2): qty 2

## 2018-05-13 MED ORDER — IOPAMIDOL (ISOVUE-300) INJECTION 61%
INTRAVENOUS | Status: AC
  Administered 2018-05-13: 18:00:00
  Filled 2018-05-13: qty 100

## 2018-05-13 MED ORDER — ACETAMINOPHEN 325 MG PO TABS: 650.0000 mg | ORAL_TABLET | Freq: Four times a day (QID) | ORAL | Status: DC | PRN

## 2018-05-13 MED ORDER — IPRATROPIUM-ALBUTEROL 0.5-2.5 (3) MG/3ML IN SOLN
3.0000 mL | Freq: Once | RESPIRATORY_TRACT | Status: AC
  Administered 2018-05-13: 3 mL via RESPIRATORY_TRACT
  Filled 2018-05-13: qty 3

## 2018-05-13 MED ORDER — IPRATROPIUM-ALBUTEROL 0.5-2.5 (3) MG/3ML IN SOLN
3.0000 mL | RESPIRATORY_TRACT | Status: DC
  Administered 2018-05-13 (×2): 3 mL via RESPIRATORY_TRACT
  Filled 2018-05-13 (×2): qty 3

## 2018-05-13 MED ORDER — SODIUM CHLORIDE 0.9% IV SOLUTION: Freq: Once | INTRAVENOUS | Status: DC

## 2018-05-13 MED ORDER — ONDANSETRON HCL 4 MG PO TABS: 4.0000 mg | ORAL_TABLET | Freq: Four times a day (QID) | ORAL | Status: DC | PRN

## 2018-05-13 MED ORDER — ALPRAZOLAM 0.5 MG PO TABS
0.5000 mg | ORAL_TABLET | Freq: Every evening | ORAL | Status: DC | PRN
  Administered 2018-05-13 – 2018-05-17 (×5): 0.5 mg via ORAL
  Filled 2018-05-13 (×5): qty 1

## 2018-05-13 MED ORDER — HYDROCODONE-ACETAMINOPHEN 5-325 MG PO TABS: 1.0000 | ORAL_TABLET | ORAL | Status: DC | PRN

## 2018-05-13 MED ORDER — LEVALBUTEROL HCL 0.63 MG/3ML IN NEBU: 0.6300 mg | INHALATION_SOLUTION | RESPIRATORY_TRACT | Status: DC | PRN

## 2018-05-13 MED ORDER — ONDANSETRON HCL 4 MG/2ML IJ SOLN: 4.0000 mg | Freq: Four times a day (QID) | INTRAMUSCULAR | Status: DC | PRN

## 2018-05-13 MED ORDER — METHOCARBAMOL 500 MG PO TABS
500.0000 mg | ORAL_TABLET | Freq: Once | ORAL | Status: AC
  Administered 2018-05-13: 500 mg via ORAL
  Filled 2018-05-13: qty 1

## 2018-05-13 MED ORDER — SODIUM CHLORIDE 0.9 % IV SOLN
INTRAVENOUS | Status: AC
  Administered 2018-05-14: 04:00:00 via INTRAVENOUS

## 2018-05-13 MED ORDER — SODIUM CHLORIDE 0.9 % IV BOLUS
1000.0000 mL | Freq: Once | INTRAVENOUS | Status: AC
  Administered 2018-05-13: 1000 mL via INTRAVENOUS

## 2018-05-13 MED ORDER — BUDESONIDE 0.25 MG/2ML IN SUSP
0.2500 mg | Freq: Two times a day (BID) | RESPIRATORY_TRACT | Status: DC
  Administered 2018-05-13 – 2018-05-18 (×10): 0.25 mg via RESPIRATORY_TRACT
  Filled 2018-05-13 (×10): qty 2

## 2018-05-13 NOTE — ED Triage Notes (Signed)
Pt from home. On 04/24/18 had a procedure done and was told by doctor to come to hospital if anything out of the ordinary. Had bright red rectal bleeding this morning for about 52mins. No other complaints. No pain. No hypotension or dizziness.

## 2018-05-13 NOTE — H&P (Signed)
[name] YIF:[id number] DOB: 1946/03/26 DOA: 05/13/2018     PCP: [name], MD   Outpatient Specialists:    Pulmonary Dr. Lenna Gilford  Oncology   Dr. Gloriann Loan, MD Patient arrived to ER on 05/13/18 at 1525  Patient coming from:   home Lives  With family    Chief Complaint:  Chief Complaint  Patient presents with  . Rectal Bleeding    HPI: [name] is a 72 y.o. male with medical history significant of sarcoma of intra-abdominal site, COPD on 6L of O2, diastolic CHF, hx of CAD sp stents    Presented with rectal bleeding started today treated with cramping abdominal pain positive he has had continues almost 30 minutes of bleeding while sitting on the toilet which is declined his body has resolved since but he has been feeling more tired and short of breath. NO chest pain, reports last BM was 30 min ago and was much less. No melena, no hematemesis feels light headed when tries to sit up.   He only takes daily aspirin not on any other anticoagulation.   Reports had Colonoscopy done by Dr. 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 Known history of COPD on 6 L of oxygen at baseline  While in ER: Noted to have hemoglobin down by 2 unit   Hemoccult positive from below  BP 92/55  Following Medications were ordered in ER: Medications  ipratropium-albuterol (DUONEB) 0.5-2.5 (3) MG/3ML nebulizer solution 3 mL (has no administration in time range)  0.9 %  sodium chloride infusion (Manually program via Guardrails IV Fluids) (has no administration in time range)  ipratropium-albuterol (DUONEB) 0.5-2.5 (3) MG/3ML nebulizer solution 3 mL (3 mLs Nebulization Given 05/13/18 1657)  iopamidol (ISOVUE-300) 61 % injection 100 mL (100 mLs Intravenous Contrast Given 05/13/18 1738)  iopamidol (ISOVUE-300) 61 %  injection (  Contrast Given 05/13/18 1817)    Significant initial  Findings: Abnormal Labs Reviewed  COMPREHENSIVE METABOLIC PANEL - Abnormal; Notable for the following components:      Result Value   Glucose, Bld 156 (*)    Calcium 8.5 (*)    Total Bilirubin 1.4 (*)    All other components within normal limits  CBC - Abnormal; Notable for the following components:   RBC 2.38 (*)    Hemoglobin 7.5 (*)    HCT 24.1 (*)    MCV 101.3 (*)    RDW 20.0 (*)    All other components within normal limits  POC OCCULT BLOOD, ED - Abnormal; Notable for the following components:   Fecal Occult Bld POSITIVE (*)    All other components within normal limits     Na  138 K 4.4  Cr   stable,   Lab Results  Component Value Date   CREATININE 0.91 05/13/2018   CREATININE 1.00 01/26/2017   CREATININE 0.98 01/24/2017      WBC  8.0  HG/HCT Down  from baseline see below    Component Value Date/Time   HGB 7.5 (L) 05/13/2018 1639   HCT 24.1 (L) 05/13/2018 1639   In April 2018 9.5    Troponin (Point of Care Test) No results for input(s): TROPIPOC in the last 72 hours.     BNP (last 3 results) No results for input(s): BNP in the last 8760 hours.  ProBNP (  last 3 results) No results for input(s): PROBNP in the last 8760 hours.  Lactic Acid, Venous    Component Value Date/Time   LATICACIDVEN 1.15 01/19/2017 1614      UA  not ordered    CTabd/pelvis -  Diverticulosis, [id number] mass stable No perforation ECG:  ordered    ED Triage Vitals  Enc Vitals Group     BP 05/13/18 1535 105/63     Pulse Rate 05/13/18 1535 (!) 104     Resp 05/13/18 1535 17     Temp 05/13/18 1535 98.4 F (36.9 C)     Temp Source 05/13/18 1535 Oral     SpO2 05/13/18 1535 100 %     Weight 05/13/18 1535 230 lb (104.3 kg)     Height 05/13/18 1535 5\' 6"  (1.676 m)     Head Circumference --      Peak Flow --      Pain Score 05/13/18 1536 0     Pain Loc --      Pain Edu? --      Excl. in [place]? --     TMAX(24)@       Latest  Blood pressure (!) 92/55, pulse 88, temperature 99.5 F (37.5 C), temperature source Rectal, resp. rate 17, height 5\' 6"  (1.676 m), weight 104.3 kg (230 lb), SpO2 99 %.      ER Provider Called: Gi    Dr.Gessner They Recommend admit to Medicine Will see in AM   Hospitalist was called for admission for lower GI bleed with symptomatic anemia, shortness of breath   Review of Systems:    Pertinent positives include:  abdominal pain, blood in stool  Constitutional:  No weight loss, night sweats, Fevers, chills, fatigue, weight loss  HEENT:  No headaches, Difficulty swallowing,Tooth/dental problems,Sore throat,  No sneezing, itching, ear ache, nasal congestion, post nasal drip,  Cardio-vascular:  No chest pain, Orthopnea, PND, anasarca, dizziness, palpitations.no Bilateral lower extremity swelling  GI:  No heartburn, indigestion,, nausea, vomiting, diarrhea, change in bowel habits, loss of appetite, melena, blood in stool, hematemesis Resp:  no shortness of breath at rest. No dyspnea on exertion, No excess mucus, no productive cough, No non-productive cough, No coughing up of blood.No change in color of mucus.No wheezing. Skin:  no rash or lesions. No jaundice GU:  no dysuria, change in color of urine, no urgency or frequency. No straining to urinate.  No flank pain.  Musculoskeletal:  No joint pain or no joint swelling. No decreased range of motion. No back pain.  Psych:  No change in mood or affect. No depression or anxiety. No memory loss.  Neuro: no localizing neurological complaints, no tingling, no weakness, no double vision, no gait abnormality, no slurred speech, no confusion  As per HPI otherwise 10 point review of systems negative.   Past Medical History:   Past Medical History:  Diagnosis Date  . Anxiety   . Bladder cancer ([place]) 2011  . Borderline diabetes mellitus   . CAD (coronary artery disease)   . Cigarette smoker   . COPD  (chronic obstructive pulmonary disease) ([place])   . DJD (degenerative joint disease)   . Emphysema of lung ([place])   . Epistaxis   . History of colonic polyps 2004   hyperplastic   . History of sinusitis   . Hypercholesteremia   . Hypertension   . Obesity       Past Surgical History:  Procedure Laterality Date  . cataract surgery  septemeber  2013  . cataract surgery/sub posterior vitrectomy in left eye  1996   Dr. Zadie Rhine  . PT[facility]  [phone]    Social History:  Ambulatory independently      reports that he quit smoking about 2 years ago. His smoking use included cigarettes. He has a 90.00 pack-year smoking history. He has never used smokeless tobacco. He reports that he drinks alcohol. He reports that he does not use drugs.     Family History:   Family History  Problem Relation Age of Onset  . Heart disease Mother   . Heart disease Brother   . Cancer Brother   . Aneurysm Brother     Allergies: Allergies  Allergen Reactions  . Amlodipine Swelling     Prior to Admission medications   Medication Sig Start Date End Date Taking? Authorizing Provider  ALPRAZolam Duanne Moron) 0.5 MG tablet Take 0.5 mg by mouth at bedtime.    Yes [provider]  Ascorbic Acid (VITAMIN C) 1000 MG tablet Take 1,000 mg by mouth every evening.    Yes [provider]  aspirin 81 MG tablet Take 81 mg by mouth at bedtime.    Yes [provider]  budesonide (PULMICORT) 0.25 MG/2ML nebulizer solution Take 2 mLs (0.25 mg total) by nebulization 2 (two) times daily. 01/10/18  Yes Noralee Space, MD  cholecalciferol (VITAMIN D) 400 units TABS tablet Take 400 Units by mouth at bedtime.   Yes [provider]  CINNAMON PO Take 1,000 mg by mouth every evening.    Yes [provider]  Flaxseed, Linseed, (FLAX SEED OIL PO) Take 1,400 mg by mouth daily.   Yes [provider]  furosemide (LASIX) 40 MG tablet Take 40 mg by mouth daily.    Yes [provider]  Garlic 8295 MG CAPS Take 1,000 mg by mouth every evening.    Yes [provider]  ibuprofen (ADVIL,MOTRIN) 600 MG tablet Take 600 mg by mouth every 6 (six) hours as needed for moderate pain.   Yes [provider]  ipratropium-albuterol (DUONEB) 0.5-2.5 (3) MG/3ML SOLN Take 3 mLs by nebulization 4 (four) times daily. 01/10/18  Yes Noralee Space, MD  isosorbide mononitrate (IMDUR) 30 MG 24 hr tablet Take 1 tablet (30 mg total) by mouth daily. 03/16/16  Yes [place], Scott T, PA-C  lisinopril (PRINIVIL,ZESTRIL) 10 MG tablet Take 10 mg by mouth daily. 12/05/16  Yes [provider]  methocarbamol (ROBAXIN) 500 MG tablet TAKE 1 TO 2 TABLETS BY MOUTH TWICE A DAY AS NEEDED FOR MUSCLE CRAMPS 03/07/18  Yes [provider]  multivitamin-lutein (OCUVITE-LUTEIN) CAPS capsule Take 1 capsule by mouth 2 (two) times daily.   Yes [provider]  Omega-3 Fatty Acids (FISH OIL) 1000 MG CPDR Take 1,000 mg by mouth daily.   Yes [provider]  Potassium 99 MG TABS Take 1 tablet by mouth 2 (two) times daily.   Yes [provider]  simvastatin (ZOCOR) 20 MG tablet Take 1 tablet (20 mg total) by mouth daily at 6 PM. 11/19/15  Yes [place], Nicki Reaper T, PA-C  vitamin B-12 (CYANOCOBALAMIN) 1000 MCG tablet Take 1,000 mcg by mouth daily.   Yes [provider]  Albuterol Sulfate (PROAIR RESPICLICK) 621 (90 Base) MCG/ACT AEPB Inhale 2 puffs into the lungs every 6 (six) hours as needed (shortness of breath).    [provider]  nitroGLYCERIN (NITROSTAT) 0.4 MG SL tablet Place 1 tablet (0.4 mg total) under the tongue every 5 (  five) minutes as needed for chest pain. 11/19/15   Richardson Dopp T, PA-C  Polyethyl Glycol-Propyl Glycol (SYSTANE OP) Apply 1 drop to eye daily as needed (dry eyes).    [provider]  potassium chloride SA (K-DUR,KLOR-CON) 20 MEQ tablet Take 1 tablet (20 mEq total) by mouth daily. Patient not taking: Reported on  05/13/2018 01/27/17   Cherene Altes, MD  sodium chloride (OCEAN) 0.65 % SOLN nasal spray Place 1 spray into both nostrils as needed for congestion. 10/29/15   Cherene Altes, MD   Physical Exam: Blood pressure (!) 92/55, pulse 88, temperature 99.5 F (37.5 C), temperature source Rectal, resp. rate 17, height 5\' 6"  (1.676 m), weight 104.3 kg (230 lb), SpO2 99 %. 1. General:  in No Acute distress  Chronically ill -appearing 2. Psychological: Alert and  Oriented 3. Head/ENT:   Dry Mucous Membranes                          Head Non traumatic, neck supple                            Poor Dentition 4. SKIN:  decreased Skin turgor,  Skin clean Dry and intact no rash 5. Heart: Regular rate and rhythm no  Murmur, no Rub or gallop 6. Lungs:   no wheezes or crackles   7. Abdomen: Soft, Right lower quadrant palpable mass tender, Non distended obese  bowel sounds present 8. Lower extremities: no clubbing, cyanosis, or edema 9. Neurologically Grossly intact, moving all 4 extremities equally   10. MSK: Normal range of motion Gross red blood per rectum  LABS:     Recent Labs  Lab 05/13/18 1639  WBC 8.0  HGB 7.5*  HCT 24.1*  MCV 101.3*  PLT 174   Basic Metabolic Panel: Recent Labs  Lab 05/13/18 1639  NA 138  K 4.4  CL 103  CO2 27  GLUCOSE 156*  BUN 20  CREATININE 0.91  CALCIUM 8.5*      Recent Labs  Lab 05/13/18 1639  AST 37  ALT 38  ALKPHOS 43  BILITOT 1.4*  PROT 6.7  ALBUMIN 3.5   No results for input(s): LIPASE, AMYLASE in the last 168 hours. No results for input(s): AMMONIA in the last 168 hours.    HbA1C: No results for input(s): HGBA1C in the last 72 hours. CBG: No results for input(s): GLUCAP in the last 168 hours.    Urine analysis:    Component Value Date/Time   COLORURINE YELLOW 01/19/2017 1739   APPEARANCEUR CLEAR 01/19/2017 1739   LABSPEC 1.023 01/19/2017 1739   PHURINE 5.0 01/19/2017 1739   GLUCOSEU NEGATIVE 01/19/2017 1739   GLUCOSEU  NEGATIVE 06/30/2010 1050   HGBUR NEGATIVE 01/19/2017 1739   BILIRUBINUR NEGATIVE 01/19/2017 1739   KETONESUR 5 (A) 01/19/2017 1739   PROTEINUR 30 (A) 01/19/2017 1739   UROBILINOGEN 0.2 06/30/2010 1050   NITRITE NEGATIVE 01/19/2017 1739   LEUKOCYTESUR NEGATIVE 01/19/2017 1739       Cultures:    Component Value Date/Time   SDES EXPECTORATED SPUTUM 09/24/2016 0532   SDES SPU 09/24/2016 0532   SPECREQUEST NONE 09/24/2016 0532   SPECREQUEST NONE 09/24/2016 0532   CULT Consistent with normal respiratory flora. 09/24/2016 0532   REPTSTATUS 09/24/2016 FINAL 09/24/2016 0532   REPTSTATUS 09/26/2016 FINAL 09/24/2016 0532     Radiological Exams on Admission: Ct Abdomen Pelvis W Contrast  Result Date: 05/13/2018 CLINICAL DATA:  Bright red rectal bleeding this morning for 30 minutes. History of bladder neoplasm. EXAM: CT ABDOMEN AND PELVIS WITH CONTRAST TECHNIQUE: Multidetector CT imaging of the abdomen and pelvis was performed using the standard protocol following bolus administration of intravenous contrast. CONTRAST:  133mL ISOVUE-300 IOPAMIDOL (ISOVUE-300) INJECTION 61% COMPARISON:  02/18/2017 and 07/02/2010 FINDINGS: Lower chest: Heart size is normal without pericardial effusion or thickening. Moderate centrilobular and paraseptal emphysema at the lung bases. There is coronary arteriosclerosis visualized along the included RCA. Hepatobiliary: Steatosis of the liver. Stable 4.7 x 3.5 x 3.6 cm hypodense lesion of the left hepatic lobe unchanged from prior 02/18/2017 study but new since 2011. Repeat delayed imaging demonstrates no significant enhancement. No biliary dilatation is identified. The gallbladder contains tiny layering calculi near the neck. No secondary signs of acute cholecystitis. Pancreas: Pancreas is slightly fatty in appearance without ductal dilatation or inflammation. Spleen: Normal size spleen without mass. Adrenals/Urinary Tract: Indeterminate hypodense nodule of the posterior  limb of the left adrenal gland measuring 15 x 12 mm, not apparent on prior studies and may be new. Hypodense simple cyst of the mid to upper right kidney measuring 6.8 cm in diameter is stable. No enhancing mass lesions of the kidneys. No nephrolithiasis nor hydroureteronephrosis. The urinary bladder is decompressed without focal mural thickening or enhancement. Stomach/Bowel: Nondistended stomach. Redemonstration of heterogeneously enhancing partially necrotic appearing mass centered within the right abdominal mesentery measuring approximately 19.6 x 17.6 x 16.1 cm in transverse by craniocaudad by AP dimension, relatively stable having previously measured 25.3 x 16.5 x 17.8 cm in 2018. No bowel obstruction or inflammation is noted. Descending and sigmoid diverticulosis is identified without acute diverticulitis. Vascular/Lymphatic: Atherosclerotic nonaneurysmal abdominal aorta with patent portal, splenic, hepatic and renal veins. No pathologically enlarged lymph nodes. Reproductive: Top-normal size prostate. Other: No pneumoperitoneum.  No ascites. Musculoskeletal: Thoracolumbar spondylosis without aggressive osseous lesions. IMPRESSION: 1. Descending and sigmoid diverticulosis without acute diverticulitis. The diverticulosis may be contributing to the lesions of the rectum. No definite annular constricting lesions are visualized. 2. Stable heterogeneous partially necrotic appearing mass centered within the mesentery of the right hemiabdomen currently estimated at the 19.6 x 17.6 x 16 1 cm. 3. Nonenhancing hypodense lesions possible a complex cyst or hemangioma, stable since 2018 currently estimated at 4.7 x 3.5 x 3.6 cm. Further correlation could performed with MRI to better evaluate as this is not apparent in 2011 and to exclude the possibility of a metastatic focus. Electronically Signed   By: Ashley Royalty M.D.   On: 05/13/2018 18:16    Chart has been reviewed    Assessment/Plan  72 y.o. male with  medical history significant of sarcoma of intra-abdominal site, COPD on 6L of O2, diastolic CHF    Admitted for suspected lower GI bleed  Present on Admission:  . Lower GI bleed - - Suspect Lower Gi source  No hx of PUD,  melena,  BUN elevation to  suggest otherwise  - Admit  For further management given:  Age >60 years,  comorbid illnesses  ,  hemodynamic instability, gross rectal bleeding,  rebleeding   exposure to antiplatelet drugs  -  most likely Diverticular source Lower abdominal pain could be secondary to known abdominal mass.  No evidence of colitis on CT        -  hemodynamic instability present      -  Admit to stepdown given above    -  ER  Provider spoke to  gastroenterology (  [facility]) they will see patient in a.m. appreciate their consult   - serial CBC.    - Monitor for any recurrence,  evidence of hemodynamic instability or significant blood loss -  type and screen,  - Transfuse 2 units - Establish at least 2 PIV and fluid resuscitate   - clear liquids for tonight keep nothing by mouth post midnight,  -  monitor for Recurrent significant  Bleeding at this point patient feels like the bleeding has slowed down  of recurs and hemodynamic instability in which case Bleeding scan and IR consult would be indicated.    . Macrocytic anemia transfuse  2 units and follow . Malignant neoplasm of bladder (HCC) chronic stable  . OSA treated with BiPAP ordered BiPAP . COPD mixed type ([place]) continue home medications currently stable . Coronary atherosclerosis of native coronary artery hold aspirin continue statin . Chronic respiratory failure with hypoxia and hypercapnia (HCC) continue BiPAP and home oxygen . Chronic diastolic CHF (congestive heart failure) (HCC) Currently appears to be dry hypotensive administer IV fluids hold blood pressure medications History of hypertension we will hold home medications given soft blood pressure Other plan as per orders.  DVT prophylaxis:  SCD      Code Status:  FULL CODE as per patient    I had personally discussed CODE STATUS with patient and family     Family Communication:   Family  at  Bedside  plan of care was discussed with  Wife,   Disposition Plan:       To home once workup is complete and patient is stable                         Would benefit from PT/OT eval prior to DC   ordered                                                Consults called: [facility]  Admission status:   inpatient       Level of care   SDU         Sky Borboa 05/13/2018, 8:53 PM    [facility]  Pager [phone]   after 2 AM please page floor coverage PA If 7AM-7PM, please contact the day team taking care of the patient  [email or web address]  Password [email or web address]

## 2018-05-13 NOTE — ED Notes (Signed)
ED TO INPATIENT HANDOFF REPORT  Name/Age/Gender [name] 72 y.o. male  Code Status Code Status History    Date Active Date Inactive Code Status Order ID Comments User Context   01/19/2017 1957 01/26/2017 1550 Full Code [id number]  Rama, Venetia Maxon, MD ED   09/24/2016 0246 09/29/2016 2000 Full Code [id number]  Vianne Bulls, MD ED   10/20/2015 1934 10/29/2015 1653 Full Code [id number]  Mendel Corning, MD Inpatient      Home/SNF/Other Home  Chief Complaint rectal bleeding  Level of Care/Admitting Diagnosis ED Disposition    ED Disposition Condition [facility] [[id number]]  Level of Care: Stepdown [14]  Admit to SDU based on following criteria: Hemodynamic compromise or significant risk of instability:  Patient requiring short term acute titration and management of vasoactive drips, and invasive monitoring (i.e., CVP and Arterial line).  Diagnosis: Lower GI bleed [[id number]]  Admitting Physician: Toy Baker [[id number]]  Attending Physician: Toy Baker [[id number]]  Estimated length of stay: 3 - 4 days  Certification:: I certify this patient will need inpatient services for at least 2 midnights  PT Class (Do Not Modify): Inpatient [101]  PT Acc Code (Do Not Modify): Private [1]       Medical History Past Medical History:  Diagnosis Date  . Anxiety   . Bladder cancer ([place]) 2011  . Borderline diabetes mellitus   . CAD (coronary artery disease)   . Cigarette smoker   . COPD (chronic obstructive pulmonary disease) ([place])   . DJD (degenerative joint disease)   . Emphysema of lung ([place])   . Epistaxis   . History of colonic polyps 2004   hyperplastic   . History of sinusitis   . Hypercholesteremia   . Hypertension   . Obesity     Allergies Allergies  Allergen Reactions  . Amlodipine Swelling    IV Location/Drains/Wounds Patient Lines/Drains/Airways Status   Active Line/Drains/Airways    Name:   Placement  date:   Placement time:   Site:   Days:   Peripheral IV 05/13/18 Right Antecubital   05/13/18    1532    Antecubital   less than 1   Incision (Closed) 03/02/17 Abdomen Right;Lower   03/02/17    1440     437          Labs/Imaging Results for orders placed or performed during the hospital encounter of 05/13/18 (from the past 48 hour(s))  Comprehensive metabolic panel     Status: Abnormal   Collection Time: 05/13/18  4:39 PM  Result Value Ref Range   Sodium 138 135 - 145 mmol/L   Potassium 4.4 3.5 - 5.1 mmol/L   Chloride 103 98 - 111 mmol/L    Comment: Please note change in reference range.   CO2 27 22 - 32 mmol/L   Glucose, Bld 156 (H) 70 - 99 mg/dL    Comment: Please note change in reference range.   BUN 20 8 - 23 mg/dL    Comment: Please note change in reference range.   Creatinine, Ser 0.91 0.61 - 1.24 mg/dL   Calcium 8.5 (L) 8.9 - 10.3 mg/dL   Total Protein 6.7 6.5 - 8.1 g/dL   Albumin 3.5 3.5 - 5.0 g/dL   AST 37 15 - 41 U/L   ALT 38 0 - 44 U/L    Comment: Please note change in reference range.   Alkaline Phosphatase 43 38 - 126 U/L  Total Bilirubin 1.4 (H) 0.3 - 1.2 mg/dL   GFR calc non Af Amer >60 >60 mL/min   GFR calc Af Amer >60 >60 mL/min    Comment: (NOTE) The eGFR has been calculated using the CKD EPI equation. This calculation has not been validated in all clinical situations. eGFR's persistently <60 mL/min signify possible Chronic Kidney Disease.    Anion gap 8 5 - 15    Comment: Performed at [facility], [address] [place], [place] [address]  CBC     Status: Abnormal   Collection Time: 05/13/18  4:39 PM  Result Value Ref Range   WBC 8.0 4.0 - 10.5 K/uL   RBC 2.38 (L) 4.22 - 5.81 MIL/uL   Hemoglobin 7.5 (L) 13.0 - 17.0 g/dL   HCT 24.1 (L) 39.0 - 52.0 %   MCV 101.3 (H) 78.0 - 100.0 fL   MCH 31.5 26.0 - 34.0 pg   MCHC 31.1 30.0 - 36.0 g/dL   RDW 20.0 (H) 11.5 - 15.5 %   Platelets 217 150 - 400 K/uL    Comment: Performed at [facility], [address] [place], [place] [address]  Type and screen [facility]     Status: None   Collection Time: 05/13/18  4:39 PM  Result Value Ref Range   ABO/RH(D) A POS    Antibody Screen NEG    Sample Expiration      05/16/2018 Performed at [facility], [address] [place], [place] [address]   POC occult blood, ED     Status: Abnormal   Collection Time: 05/13/18  5:12 PM  Result Value Ref Range   Fecal Occult Bld POSITIVE (A) NEGATIVE   Ct Abdomen Pelvis W Contrast  Result Date: 05/13/2018 CLINICAL DATA:  Bright red rectal bleeding this morning for 30 minutes. History of bladder neoplasm. EXAM: CT ABDOMEN AND PELVIS WITH CONTRAST TECHNIQUE: Multidetector CT imaging of the abdomen and pelvis was performed using the standard protocol following bolus administration of intravenous contrast. CONTRAST:  100mL ISOVUE-300 IOPAMIDOL (ISOVUE-300) INJECTION 61% COMPARISON:  02/18/2017 and 07/02/2010 FINDINGS: Lower chest: Heart size is normal without pericardial effusion or thickening. Moderate centrilobular and paraseptal emphysema at the lung bases. There is coronary arteriosclerosis visualized along the included RCA. Hepatobiliary: Steatosis of the liver. Stable 4.7 x 3.5 x 3.6 cm hypodense lesion of the left hepatic lobe unchanged from prior 02/18/2017 study but new since 2011. Repeat delayed imaging demonstrates no significant enhancement. No biliary dilatation is identified. The gallbladder contains tiny layering calculi near the neck. No secondary signs of acute cholecystitis. Pancreas: Pancreas is slightly fatty in appearance without ductal dilatation or inflammation. Spleen: Normal size spleen without mass. Adrenals/Urinary Tract: Indeterminate hypodense nodule of the posterior limb of the left adrenal gland measuring 15 x 12 mm, not apparent on prior studies and may be new. Hypodense simple cyst of the mid to upper right kidney  measuring 6.8 cm in diameter is stable. No enhancing mass lesions of the kidneys. No nephrolithiasis nor hydroureteronephrosis. The urinary bladder is decompressed without focal mural thickening or enhancement. Stomach/Bowel: Nondistended stomach. Redemonstration of heterogeneously enhancing partially necrotic appearing mass centered within the right abdominal mesentery measuring approximately 19.6 x 17.6 x 16.1 cm in transverse by craniocaudad by AP dimension, relatively stable having previously measured 25.3 x 16.5 x 17.8 cm in 2018. No bowel obstruction or inflammation is noted. Descending and sigmoid diverticulosis is identified without acute diverticulitis. Vascular/Lymphatic: Atherosclerotic   nonaneurysmal abdominal aorta with patent portal, splenic, hepatic and renal veins. No pathologically enlarged lymph nodes. Reproductive: Top-normal size prostate. Other: No pneumoperitoneum.  No ascites. Musculoskeletal: Thoracolumbar spondylosis without aggressive osseous lesions. IMPRESSION: 1. Descending and sigmoid diverticulosis without acute diverticulitis. The diverticulosis may be contributing to the lesions of the rectum. No definite annular constricting lesions are visualized. 2. Stable heterogeneous partially necrotic appearing mass centered within the mesentery of the right hemiabdomen currently estimated at the 19.6 x 17.6 x 16 1 cm. 3. Nonenhancing hypodense lesions possible a complex cyst or hemangioma, stable since 2018 currently estimated at 4.7 x 3.5 x 3.6 cm. Further correlation could performed with MRI to better evaluate as this is not apparent in 2011 and to exclude the possibility of a metastatic focus. Electronically Signed   By: David  Kwon M.D.   On: 05/13/2018 18:16    Pending Labs Unresulted Labs (From admission, onward)   Start     Ordered   05/13/18 2020  CBC  Now then every 6 hours,   R    Comments:  Call md if Hg<8    05/13/18 2019   05/13/18 1926  Troponin I (q 6hr x 3)  Now then  every 6 hours,   R     05/13/18 1926   05/13/18 1910  Vitamin B12  (Anemia Panel (PNL))  Once,   R     05/13/18 1909   05/13/18 1910  Folate  (Anemia Panel (PNL))  Once,   R     05/13/18 1909   05/13/18 1910  Iron and TIBC  (Anemia Panel (PNL))  Once,   R     05/13/18 1909   05/13/18 1910  Ferritin  (Anemia Panel (PNL))  Once,   R     05/13/18 1909   05/13/18 1910  Reticulocytes  (Anemia Panel (PNL))  Once,   R     05/13/18 1909   05/13/18 1843  Prepare RBC  (Adult Blood Administration - Red Blood Cells)  Once,   R    Question Answer Comment  # of Units 2 units   Transfusion Indications Symptomatic Anemia   If emergent release call blood bank Not emergent release   Instructions: Transfuse      05/13/18 1842   05/13/18 1637  ABO/Rh  Once,   R     05/13/18 1637   Signed and Held  Magnesium  Tomorrow morning,   R    Comments:  Call MD if <1.5    Signed and Held   Signed and Held  Phosphorus  Tomorrow morning,   R     Signed and Held   Signed and Held  TSH  Once,   R    Comments:  Cancel if already done within 1 month and notify MD    Signed and Held   Signed and Held  Comprehensive metabolic panel  Once,   R    Comments:  Cal MD for K<3.5 or >5.0    Signed and Held   Signed and Held  CBC  Once,   R    Comments:  Call for hg <8.0    Signed and Held      Vitals/Pain Today's Vitals   05/13/18 1945 05/13/18 2000 05/13/18 2015 05/13/18 2030  BP:  98/63  112/66  Pulse: (!) 105 (!) 103 (!) 103 (!) 104  Resp: 19 18 17 20  Temp:      TempSrc:      SpO2: 98%   97% 97% 99%  Weight:      Height:      PainSc:        Isolation Precautions No active isolations  Medications Medications  ipratropium-albuterol (DUONEB) 0.5-2.5 (3) MG/3ML nebulizer solution 3 mL (3 mLs Nebulization Given 05/13/18 2044)  0.9 %  sodium chloride infusion (Manually program via Guardrails IV Fluids) ( Intravenous Hold 05/13/18 2034)  sodium chloride 0.9 % bolus 1,000 mL (1,000 mLs Intravenous New  Bag/Given 05/13/18 2041)  ipratropium-albuterol (DUONEB) 0.5-2.5 (3) MG/3ML nebulizer solution 3 mL (3 mLs Nebulization Given 05/13/18 1657)  iopamidol (ISOVUE-300) 61 % injection 100 mL (100 mLs Intravenous Contrast Given 05/13/18 1738)  iopamidol (ISOVUE-300) 61 % injection (  Contrast Given 05/13/18 1817)    Mobility non-ambulatory 

## 2018-05-13 NOTE — ED Notes (Signed)
Gave Pt ice chips per Dr. Tamera Punt.

## 2018-05-13 NOTE — ED Provider Notes (Signed)
[facility] DEPT Provider Note   CSN: [id number] Arrival date & time: 05/13/18  1525     History   Chief Complaint Chief Complaint  Patient presents with  . Rectal Bleeding    HPI [name] is a 72 y.o. male.  Patient is a 72 year old male who presents with rectal bleeding.  He states last night he started having some cramping in his right lower abdomen.  He has had some constant soreness to that area since that time.  This afternoon he started having some bright red blood per rectum.  He states he sat on the toilet and it continuously bled for about 30 minutes.  It was not mixed with stool, he states it was just blood.  He has not had any episodes since that time.  He feels more fatigued than normal but denies any increased shortness of breath.  He does have chronic shortness of breath associated with COPD and is on chronic oxygen at 6 L/min.  He did have a recent procedure done.  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  He had a mass in his liver and right adrenal gland which he had ablation therapy on on June 10.  This was done at [facility].  His oncologist is at [facility].     Past Medical History:  Diagnosis Date  . Anxiety   . Bladder cancer ([place]) 2011  . Borderline diabetes mellitus   . CAD (coronary artery disease)   . Cigarette smoker   . COPD (chronic obstructive pulmonary disease) ([place])   . DJD (degenerative joint disease)   . Emphysema of lung ([place])   . Epistaxis   . History of colonic polyps 2004   hyperplastic   . History of sinusitis   . Hypercholesteremia   . Hypertension   . Obesity     Patient Active Problem List   Diagnosis Date Noted  . Rectal bleeding 05/13/2018  . Chronic diastolic CHF (congestive heart failure) ([place]) 05/13/2018  . Class 2 obesity with body mass index (BMI) of 37.0 to 37.9 in adult 01/10/2018  . 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 BiPAP (biphasic positive airway pressure)  dependence 01/27/2017  . Acute on chronic diastolic CHF (congestive heart failure) ([place]) 01/19/2017  . Chronic respiratory failure with hypoxia and hypercapnia ([place]) 10/13/2016  . OSA treated with BiPAP 10/13/2016  . Acute on chronic congestive heart failure ([place])   . Macrocytic anemia 09/24/2016  . COPD with acute exacerbation ([place]) 09/24/2016  . Acute bronchitis 02/03/2016  . Ex-smoker 10/31/2015  . Dilated cardiomyopathy ([place])   . Pulmonary hypertension ([place])   . Acute on chronic respiratory failure with hypoxia and hypercapnia ([place]) 10/20/2015  . Nicotine abuse 10/20/2015  . 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 LBP (low back pain) 03/24/2011  . Malignant neoplasm of bladder ([place]) 09/22/2010  . HEMATURIA UNSPECIFIED 05/14/2010  . Osteoarthritis 03/28/2010  . Coronary atherosclerosis of native coronary artery 12/19/2007  . Acute sinusitis 12/19/2007  . COPD mixed type ([place]) 12/19/2007  . COLONIC POLYPS 12/18/2007  . HYPERCHOLESTEROLEMIA 12/18/2007  . Anxiety 12/18/2007  . Essential hypertension 12/18/2007  . GASTRITIS 12/18/2007  . Impaired fasting glucose 12/18/2007    Past Surgical History:  Procedure Laterality Date  . cataract surgery  septemeber 2013  . cataract surgery/sub posterior vitrectomy in left eye  1996   Dr. Zadie Rhine  . PTCA  [phone]        Home Medications  Prior to Admission medications   Medication Sig Start Date End Date Taking? Authorizing Provider  ALPRAZolam Duanne Moron) 0.5 MG tablet Take 0.5 mg by mouth at bedtime.    Yes [provider]  Ascorbic Acid (VITAMIN C) 1000 MG tablet Take 1,000 mg by mouth every evening.    Yes [provider]  aspirin 81 MG tablet Take 81 mg by mouth at bedtime.    Yes [provider]  budesonide (PULMICORT) 0.25 MG/2ML nebulizer solution Take 2 mLs (0.25 mg total) by nebulization 2 (two) times daily. 01/10/18  Yes Noralee Space, MD  cholecalciferol (VITAMIN D) 400 units TABS  tablet Take 400 Units by mouth at bedtime.   Yes [provider]  CINNAMON PO Take 1,000 mg by mouth every evening.    Yes [provider]  Flaxseed, Linseed, (FLAX SEED OIL PO) Take 1,400 mg by mouth daily.   Yes [provider]  furosemide (LASIX) 40 MG tablet Take 40 mg by mouth daily.    Yes [provider]  Garlic 8756 MG CAPS Take 1,000 mg by mouth every evening.    Yes [provider]  ibuprofen (ADVIL,MOTRIN) 600 MG tablet Take 600 mg by mouth every 6 (six) hours as needed for moderate pain.   Yes [provider]  ipratropium-albuterol (DUONEB) 0.5-2.5 (3) MG/3ML SOLN Take 3 mLs by nebulization 4 (four) times daily. 01/10/18  Yes Noralee Space, MD  isosorbide mononitrate (IMDUR) 30 MG 24 hr tablet Take 1 tablet (30 mg total) by mouth daily. 03/16/16  Yes Weaver, Scott T, PA-C  lisinopril (PRINIVIL,ZESTRIL) 10 MG tablet Take 10 mg by mouth daily. 12/05/16  Yes [provider]  methocarbamol (ROBAXIN) 500 MG tablet TAKE 1 TO 2 TABLETS BY MOUTH TWICE A DAY AS NEEDED FOR MUSCLE CRAMPS 03/07/18  Yes [provider]  multivitamin-lutein (OCUVITE-LUTEIN) CAPS capsule Take 1 capsule by mouth 2 (two) times daily.   Yes [provider]  Omega-3 Fatty Acids (FISH OIL) 1000 MG CPDR Take 1,000 mg by mouth daily.   Yes [provider]  Potassium 99 MG TABS Take 1 tablet by mouth 2 (two) times daily.   Yes [provider]  simvastatin (ZOCOR) 20 MG tablet Take 1 tablet (20 mg total) by mouth daily at 6 PM. 11/19/15  Yes Weaver, Nicki Reaper T, PA-C  vitamin B-12 (CYANOCOBALAMIN) 1000 MCG tablet Take 1,000 mcg by mouth daily.   Yes [provider]  Albuterol Sulfate (PROAIR RESPICLICK) 433 (90 Base) MCG/ACT AEPB Inhale 2 puffs into the lungs every 6 (six) hours as needed (shortness of breath).    [provider]  nitroGLYCERIN (NITROSTAT) 0.4 MG SL tablet Place 1 tablet (0.4 mg total) under the tongue  every 5 (five) minutes as needed for chest pain. 11/19/15   Richardson Dopp T, PA-C  Polyethyl Glycol-Propyl Glycol (SYSTANE OP) Apply 1 drop to eye daily as needed (dry eyes).    [provider]  potassium chloride SA (K-DUR,KLOR-CON) 20 MEQ tablet Take 1 tablet (20 mEq total) by mouth daily. Patient not taking: Reported on 05/13/2018 01/27/17   Cherene Altes, MD  sodium chloride (OCEAN) 0.65 % SOLN nasal spray Place 1 spray into both nostrils as needed for congestion. 10/29/15   Cherene Altes, MD    Family History Family History  Problem Relation Age of Onset  . Heart disease Mother   . Heart disease Brother   . Cancer Brother   . Aneurysm Brother  Social History Social History   Tobacco Use  . Smoking status: Former Smoker    Packs/day: 2.00    Years: 45.00    Pack years: 90.00    Types: Cigarettes    Last attempt to quit: 10/17/2015    Years since quitting: 2.5  . Smokeless tobacco: Never Used  Substance Use Topics  . Alcohol use: Yes    Alcohol/week: 0.0 oz    Comment: glass of wine at night  . Drug use: No     Allergies   Amlodipine   Review of Systems Review of Systems  Constitutional: Positive for fatigue. Negative for chills, diaphoresis and fever.  HENT: Negative for congestion, rhinorrhea and sneezing.   Eyes: Negative.   Respiratory: Negative for cough, chest tightness and shortness of breath.   Cardiovascular: Negative for chest pain and leg swelling.  Gastrointestinal: Positive for abdominal pain and blood in stool. Negative for diarrhea, nausea and vomiting.  Genitourinary: Negative for difficulty urinating, flank pain, frequency and hematuria.  Musculoskeletal: Negative for arthralgias and back pain.  Skin: Negative for rash.  Neurological: Negative for dizziness, speech difficulty, weakness, numbness and headaches.     Physical Exam Updated Vital Signs BP (!) 92/55   Pulse 88   Temp 99.5 F (37.5 C) (Rectal)   Resp 17   Ht  5\' 6"  (1.676 m)   Wt 104.3 kg (230 lb)   SpO2 99%   BMI 37.12 kg/m   Physical Exam  Constitutional: He is oriented to person, place, and time. He appears well-developed and well-nourished.  HENT:  Head: Normocephalic and atraumatic.  Eyes: Pupils are equal, round, and reactive to light.  Neck: Normal range of motion. Neck supple.  Cardiovascular: Normal rate, regular rhythm and normal heart sounds.  Pulmonary/Chest: Effort normal and breath sounds normal. No respiratory distress. He has no wheezes. He has no rales. He exhibits no tenderness.  Abdominal: Soft. Bowel sounds are normal. There is tenderness in the right lower quadrant. There is no rebound and no guarding.  Genitourinary:  Genitourinary Comments: Rectal exam shows positive gross blood.  No visualized source of bleeding, no active visualized bleeding  Musculoskeletal: Normal range of motion. He exhibits no edema.  Lymphadenopathy:    He has no cervical adenopathy.  Neurological: He is alert and oriented to person, place, and time.  Skin: Skin is warm and dry. No rash noted.  Psychiatric: He has a normal mood and affect.     ED Treatments / Results  Labs (all labs ordered are listed, but only abnormal results are displayed) Labs Reviewed  COMPREHENSIVE METABOLIC PANEL - Abnormal; Notable for the following components:      Result Value   Glucose, Bld 156 (*)    Calcium 8.5 (*)    Total Bilirubin 1.4 (*)    All other components within normal limits  CBC - Abnormal; Notable for the following components:   RBC 2.38 (*)    Hemoglobin 7.5 (*)    HCT 24.1 (*)    MCV 101.3 (*)    RDW 20.0 (*)    All other components within normal limits  POC OCCULT BLOOD, ED - Abnormal; Notable for the following components:   Fecal Occult Bld POSITIVE (*)    All other components within normal limits  VITAMIN B12  FOLATE  IRON AND TIBC  FERRITIN  RETICULOCYTES  TROPONIN I  TROPONIN I  TROPONIN I  TYPE AND SCREEN  ABO/RH    PREPARE RBC (CROSSMATCH)  EKG EKG Interpretation  Date/Time:  Saturday May 13 2018 19:35:39 EDT Ventricular Rate:  107 PR Interval:    QRS Duration: 93 QT Interval:  325 QTC Calculation: 434 R Axis:   45 Text Interpretation:  Sinus tachycardia with irregular rate Baseline wander in lead(s) V2 since last tracing no significant change Confirmed by Malvin Johns [phone]) on 05/13/2018 7:45:29 PM   Radiology Ct Abdomen Pelvis W Contrast  Result Date: 05/13/2018 CLINICAL DATA:  Bright red rectal bleeding this morning for 30 minutes. History of bladder neoplasm. EXAM: CT ABDOMEN AND PELVIS WITH CONTRAST TECHNIQUE: Multidetector CT imaging of the abdomen and pelvis was performed using the standard protocol following bolus administration of intravenous contrast. CONTRAST:  154mL ISOVUE-300 IOPAMIDOL (ISOVUE-300) INJECTION 61% COMPARISON:  02/18/2017 and 07/02/2010 FINDINGS: Lower chest: Heart size is normal without pericardial effusion or thickening. Moderate centrilobular and paraseptal emphysema at the lung bases. There is coronary arteriosclerosis visualized along the included RCA. Hepatobiliary: Steatosis of the liver. Stable 4.7 x 3.5 x 3.6 cm hypodense lesion of the left hepatic lobe unchanged from prior 02/18/2017 study but new since 2011. Repeat delayed imaging demonstrates no significant enhancement. No biliary dilatation is identified. The gallbladder contains tiny layering calculi near the neck. No secondary signs of acute cholecystitis. Pancreas: Pancreas is slightly fatty in appearance without ductal dilatation or inflammation. Spleen: Normal size spleen without mass. Adrenals/Urinary Tract: Indeterminate hypodense nodule of the posterior limb of the left adrenal gland measuring 15 x 12 mm, not apparent on prior studies and may be new. Hypodense simple cyst of the mid to upper right kidney measuring 6.8 cm in diameter is stable. No enhancing mass lesions of the kidneys. No nephrolithiasis  nor hydroureteronephrosis. The urinary bladder is decompressed without focal mural thickening or enhancement. Stomach/Bowel: Nondistended stomach. Redemonstration of heterogeneously enhancing partially necrotic appearing mass centered within the right abdominal mesentery measuring approximately 19.6 x 17.6 x 16.1 cm in transverse by craniocaudad by AP dimension, relatively stable having previously measured 25.3 x 16.5 x 17.8 cm in 2018. No bowel obstruction or inflammation is noted. Descending and sigmoid diverticulosis is identified without acute diverticulitis. Vascular/Lymphatic: Atherosclerotic nonaneurysmal abdominal aorta with patent portal, splenic, hepatic and renal veins. No pathologically enlarged lymph nodes. Reproductive: Top-normal size prostate. Other: No pneumoperitoneum.  No ascites. Musculoskeletal: Thoracolumbar spondylosis without aggressive osseous lesions. IMPRESSION: 1. Descending and sigmoid diverticulosis without acute diverticulitis. The diverticulosis may be contributing to the lesions of the rectum. No definite annular constricting lesions are visualized. 2. Stable heterogeneous partially necrotic appearing mass centered within the mesentery of the right hemiabdomen currently estimated at the 19.6 x 17.6 x 16 1 cm. 3. Nonenhancing hypodense lesions possible a complex cyst or hemangioma, stable since 2018 currently estimated at 4.7 x 3.5 x 3.6 cm. Further correlation could performed with MRI to better evaluate as this is not apparent in 2011 and to exclude the possibility of a metastatic focus. Electronically Signed   By: Ashley Royalty M.D.   On: 05/13/2018 18:16    Procedures Procedures (including critical care time)  Medications Ordered in ED Medications  ipratropium-albuterol (DUONEB) 0.5-2.5 (3) MG/3ML nebulizer solution 3 mL (has no administration in time range)  0.9 %  sodium chloride infusion (Manually program via Guardrails IV Fluids) (has no administration in time range)    ipratropium-albuterol (DUONEB) 0.5-2.5 (3) MG/3ML nebulizer solution 3 mL (3 mLs Nebulization Given 05/13/18 1657)  iopamidol (ISOVUE-300) 61 % injection 100 mL (100 mLs Intravenous Contrast Given 05/13/18 1738)  iopamidol (ISOVUE-300)  61 % injection (  Contrast Given 05/13/18 1817)     Initial Impression / Assessment and Plan / ED Course  I have reviewed the triage vital signs and the nursing notes.  Pertinent labs & imaging results that were available during my care of the patient were reviewed by me and considered in my medical decision making (see chart for details).     Patient is a 72 year old male who presents with rectal bleeding.  He has had one episode at home last about 30 minutes.  He had one subsequent episode here in the ED.  He has some ongoing right side abdominal discomfort.  A CT scan was performed which shows no acute abnormality or source of the bleeding.  His hemoglobin is 7.5 which is dropped from 8.3 in April of this year.  His blood pressure is little bit soft.  He was typed and crossed for 2 units of packed red cells and will be transfused with 2 units.  I spoke with Dr. Roel Cluck with the hospitalist service to admit the patient.  I also discussed the patient with Dr. Carlean Purl with [facility] gastroenterology who will put the patient on their list for consults in the morning.  CRITICAL CARE Performed by: Malvin Johns Total critical care time: 60 minutes Critical care time was exclusive of separately billable procedures and treating other patients. Critical care was necessary to treat or prevent imminent or life-threatening deterioration. Critical care was time spent personally by me on the following activities: development of treatment plan with patient and/or surrogate as well as nursing, discussions with consultants, evaluation of patient's response to treatment, examination of patient, obtaining history from patient or surrogate, ordering and performing treatments and  interventions, ordering and review of laboratory studies, ordering and review of radiographic studies, pulse oximetry and re-evaluation of patient's condition.   Final Clinical Impressions(s) / ED Diagnoses   Final diagnoses:  Rectal bleeding  Anemia, unspecified type  Hypotension, unspecified hypotension type    ED Discharge Orders    None       Malvin Johns, MD 05/13/18 2008

## 2018-05-14 ENCOUNTER — Inpatient Hospital Stay (HOSPITAL_COMMUNITY): Payer: Medicare Other

## 2018-05-14 DIAGNOSIS — D62 Acute posthemorrhagic anemia: Secondary | ICD-10-CM

## 2018-05-14 DIAGNOSIS — R933 Abnormal findings on diagnostic imaging of other parts of digestive tract: Secondary | ICD-10-CM

## 2018-05-14 DIAGNOSIS — K922 Gastrointestinal hemorrhage, unspecified: Secondary | ICD-10-CM

## 2018-05-14 LAB — PREPARE RBC (CROSSMATCH)

## 2018-05-14 LAB — CBC
HCT: 20.7 % — ABNORMAL LOW (ref 39.0–52.0)
HEMATOCRIT: 23.4 % — AB (ref 39.0–52.0)
HEMATOCRIT: 24.4 % — AB (ref 39.0–52.0)
HEMOGLOBIN: 7.7 g/dL — AB (ref 13.0–17.0)
HEMOGLOBIN: 8 g/dL — AB (ref 13.0–17.0)
Hemoglobin: 6.8 g/dL — CL (ref 13.0–17.0)
MCH: 30.8 pg (ref 26.0–34.0)
MCH: 29.8 pg (ref 26.0–34.0)
MCH: 30 pg (ref 26.0–34.0)
MCHC: 32.9 g/dL (ref 30.0–36.0)
MCHC: 32.9 g/dL (ref 30.0–36.0)
MCHC: 32.8 g/dL (ref 30.0–36.0)
MCV: 93.6 fL (ref 78.0–100.0)
MCV: 90.8 fL (ref 78.0–100.0)
MCV: 91.4 fL (ref 78.0–100.0)
PLATELETS: 163 10*3/uL (ref 150–400)
PLATELETS: 196 10*3/uL (ref 150–400)
Platelets: 149 10*3/uL — ABNORMAL LOW (ref 150–400)
RBC: 2.5 MIL/uL — AB (ref 4.22–5.81)
RBC: 2.28 MIL/uL — ABNORMAL LOW (ref 4.22–5.81)
RBC: 2.67 MIL/uL — ABNORMAL LOW (ref 4.22–5.81)
RDW: 21.5 % — ABNORMAL HIGH (ref 11.5–15.5)
RDW: 22.2 % — AB (ref 11.5–15.5)
RDW: 19.5 % — ABNORMAL HIGH (ref 11.5–15.5)
WBC: 8 10*3/uL (ref 4.0–10.5)
WBC: 11.8 10*3/uL — ABNORMAL HIGH (ref 4.0–10.5)
WBC: 11 10*3/uL — ABNORMAL HIGH (ref 4.0–10.5)

## 2018-05-14 LAB — COMPREHENSIVE METABOLIC PANEL
ALT: 29 U/L (ref 0–44)
ANION GAP: 6 (ref 5–15)
AST: 31 U/L (ref 15–41)
Albumin: 2.9 g/dL — ABNORMAL LOW (ref 3.5–5.0)
Alkaline Phosphatase: 32 U/L — ABNORMAL LOW (ref 38–126)
BUN: 21 mg/dL (ref 8–23)
CO2: 25 mmol/L (ref 22–32)
Calcium: 7.8 mg/dL — ABNORMAL LOW (ref 8.9–10.3)
Chloride: 105 mmol/L (ref 98–111)
Creatinine, Ser: 0.99 mg/dL (ref 0.61–1.24)
Glucose, Bld: 153 mg/dL — ABNORMAL HIGH (ref 70–99)
POTASSIUM: 5.2 mmol/L — AB (ref 3.5–5.1)
SODIUM: 136 mmol/L (ref 135–145)
Total Bilirubin: 1.5 mg/dL — ABNORMAL HIGH (ref 0.3–1.2)
Total Protein: 5.3 g/dL — ABNORMAL LOW (ref 6.5–8.1)

## 2018-05-14 LAB — TSH: TSH: 4.399 u[IU]/mL (ref 0.350–4.500)

## 2018-05-14 LAB — MAGNESIUM: Magnesium: 1.7 mg/dL (ref 1.7–2.4)

## 2018-05-14 LAB — PHOSPHORUS: PHOSPHORUS: 3.6 mg/dL (ref 2.5–4.6)

## 2018-05-14 LAB — TROPONIN I: Troponin I: <0.03 ng/mL (ref <0.03)

## 2018-05-14 LAB — MRSA PCR SCREENING: MRSA by PCR: NEGATIVE

## 2018-05-14 MED ORDER — SODIUM CHLORIDE 0.9% IV SOLUTION: Freq: Once | INTRAVENOUS | Status: DC

## 2018-05-14 MED ORDER — ALBUTEROL SULFATE (2.5 MG/3ML) 0.083% IN NEBU
2.5000 mg | INHALATION_SOLUTION | RESPIRATORY_TRACT | Status: DC | PRN
  Administered 2018-05-14: 2.5 mg via RESPIRATORY_TRACT
  Filled 2018-05-14: qty 3

## 2018-05-14 MED ORDER — IPRATROPIUM-ALBUTEROL 0.5-2.5 (3) MG/3ML IN SOLN
3.0000 mL | Freq: Four times a day (QID) | RESPIRATORY_TRACT | Status: DC
  Administered 2018-05-14 – 2018-05-18 (×19): 3 mL via RESPIRATORY_TRACT
  Filled 2018-05-14 (×18): qty 3

## 2018-05-14 MED ORDER — METHOCARBAMOL 500 MG PO TABS
500.0000 mg | ORAL_TABLET | Freq: Once | ORAL | Status: AC
  Administered 2018-05-14: 500 mg via ORAL
  Filled 2018-05-14: qty 1

## 2018-05-14 MED ORDER — DM-GUAIFENESIN ER 30-600 MG PO TB12
1.0000 | ORAL_TABLET | Freq: Two times a day (BID) | ORAL | Status: DC
  Administered 2018-05-14: 1 via ORAL
  Filled 2018-05-14: qty 1

## 2018-05-14 MED ORDER — IOPAMIDOL (ISOVUE-370) INJECTION 76%
INTRAVENOUS | Status: AC
  Filled 2018-05-14: qty 100

## 2018-05-14 MED ORDER — IOPAMIDOL (ISOVUE-370) INJECTION 76%
100.0000 mL | Freq: Once | INTRAVENOUS | Status: AC | PRN
  Administered 2018-05-14: 100 mL via INTRAVENOUS

## 2018-05-14 NOTE — Progress Notes (Signed)
PT Cancellation Note  Patient Details Name: [name] MRN: [id number] DOB: 1946/11/01   Cancelled Treatment:     PT order received but eval deferred - RN reports pt with continued heavy rectal bleeding.  Will follow.   [name] 05/14/2018, 8:31 AM

## 2018-05-14 NOTE — Progress Notes (Signed)
PROGRESS NOTE    BRANNEN KOPPEN  ZOX:[id number] DOB: 08-Apr-1946 DOA: 05/13/2018 PCP: [name], MD   Brief Narrative: [name] is a 72 y.o. male with medical history significant of sarcoma of intra-abdominal site, COPD on 6L of O2, diastolic CHF, hx of CAD sp stents     Assessment & Plan:   Active Problems:   Malignant neoplasm of bladder (HCC)   Coronary atherosclerosis of native coronary artery   COPD mixed type (HCC)   Macrocytic anemia   Chronic respiratory failure with hypoxia and hypercapnia (HCC)   OSA treated with BiPAP   Rectal bleeding   Chronic diastolic CHF (congestive heart failure) (HCC)   Lower GI bleed   GI bleeding Likely diverticular however patient does take ibuprofen as an outpatient. Continuing to bleed. Has required 2 units of PRBC to date. -Continue CBC q 6 hours -Transfuse for hemoglobin less than 8  Chronic diastolic heart failure Asymptomatic. Lisinopril, lasix held  OSA Bipap  Spindle cell sarcoma Stable. Outpatient follow-up.  COPD Chronic respiratory failure with hypoxia and hypercapnia On chronic oxygen. Stable. -Continue oxygen therapy  CAD s/p stents. Lisinopril, Imdur held secondary to acute GI bleeding -Continue Simvastatin  Hyperkalemia Mild. -repeat BMP in AM   DVT prophylaxis: SCDs Code Status:   Code Status: Full Code Family Communication: Wife at bedside Disposition Plan: Discharge pending resolution of GI bleeding and stabilization of hemoglobin   Consultants:   [facility]  Procedures:   None  Antimicrobials:  None    Subjective: Bleeding this morning. Hungry.  Objective: Vitals:   05/14/18 0700 05/14/18 0800 05/14/18 0806 05/14/18 0844  BP: (!) 128/57 (!) 128/52    Pulse: (!) 102 (!) 107    Resp: 18 20    Temp:   98 F (36.7 C)   TempSrc:   Oral   SpO2: 100% 99%  98%  Weight:      Height:        Intake/Output Summary (Last 24 hours) at 05/14/2018 1038 Last data filed at  05/14/2018 0900 Gross per 24 hour  Intake 927.5 ml  Output 5 ml  Net 922.5 ml   Filed Weights   05/13/18 1535 05/13/18 2146  Weight: 104.3 kg (230 lb) 100.7 kg (222 lb 0.1 oz)    Examination:  General exam: Appears calm and comfortable Respiratory system: Clear to auscultation. Respiratory effort normal. Cardiovascular system: S1 & S2 heard, RRR. No murmurs, rubs, gallops or clicks. Gastrointestinal system: Abdomen is nondistended, soft and nontender. Masses felt in RLQ quadrant. Normal bowel sounds heard. Central nervous system: Alert and oriented. No focal neurological deficits. Extremities: No edema. No calf tenderness Skin: No cyanosis. No rashes Psychiatry: Judgement and insight appear normal. Mood & affect appropriate.     Data Reviewed: I have personally reviewed following labs and imaging studies  CBC: Recent Labs  Lab 05/13/18 1639 05/13/18 2043 05/14/18 0542  WBC 8.0 9.5 8.0  HGB 7.5* 6.8* 7.7*  HCT 24.1* 21.4* 23.4*  MCV 101.3* 100.9* 93.6  PLT 217 221 811   Basic Metabolic Panel: Recent Labs  Lab 05/13/18 1639 05/14/18 0542  NA 138 136  K 4.4 5.2*  CL 103 105  CO2 27 25  GLUCOSE 156* 153*  BUN 20 21  CREATININE 0.91 0.99  CALCIUM 8.5* 7.8*  MG  --  1.7  PHOS  --  3.6   GFR: Estimated Creatinine Clearance: 73.9 mL/min (by C-G formula based on SCr of 0.99 mg/dL). Liver Function Tests:  Recent Labs  Lab 05/13/18 1639 05/14/18 0542  AST 37 31  ALT 38 29  ALKPHOS 43 32*  BILITOT 1.4* 1.5*  PROT 6.7 5.3*  ALBUMIN 3.5 2.9*   No results for input(s): LIPASE, AMYLASE in the last 168 hours. No results for input(s): AMMONIA in the last 168 hours. Coagulation Profile: No results for input(s): INR, PROTIME in the last 168 hours. Cardiac Enzymes: Recent Labs  Lab 05/13/18 2043 05/14/18 0542  TROPONINI <0.03 <0.03   BNP (last 3 results) No results for input(s): PROBNP in the last 8760 hours. HbA1C: No results for input(s): HGBA1C in the  last 72 hours. CBG: No results for input(s): GLUCAP in the last 168 hours. Lipid Profile: No results for input(s): CHOL, HDL, LDLCALC, TRIG, CHOLHDL, LDLDIRECT in the last 72 hours. Thyroid Function Tests: No results for input(s): TSH, T4TOTAL, FREET4, T3FREE, THYROIDAB in the last 72 hours. Anemia Panel: Recent Labs    05/13/18 2043  VITAMINB12 1,000*  FOLATE 23.1  FERRITIN 310  TIBC 270  IRON 137  RETICCTPCT 7.3*   Sepsis Labs: No results for input(s): PROCALCITON, LATICACIDVEN in the last 168 hours.  Recent Results (from the past 240 hour(s))  MRSA PCR Screening     Status: None   Collection Time: 05/13/18 10:15 PM  Result Value Ref Range Status   MRSA by PCR NEGATIVE NEGATIVE Final    Comment:        The GeneXpert MRSA Assay (FDA approved for NASAL specimens only), is one component of a comprehensive MRSA colonization surveillance program. It is not intended to diagnose MRSA infection nor to guide or monitor treatment for MRSA infections. Performed at [facility], [place] [address] [place], [place] [address]          Radiology Studies: Ct Abdomen Pelvis W Contrast  Result Date: 05/13/2018 CLINICAL DATA:  Bright red rectal bleeding this morning for 30 minutes. History of bladder neoplasm. EXAM: CT ABDOMEN AND PELVIS WITH CONTRAST TECHNIQUE: Multidetector CT imaging of the abdomen and pelvis was performed using the standard protocol following bolus administration of intravenous contrast. CONTRAST:  189mL ISOVUE-300 IOPAMIDOL (ISOVUE-300) INJECTION 61% COMPARISON:  02/18/2017 and 07/02/2010 FINDINGS: Lower chest: Heart size is normal without pericardial effusion or thickening. Moderate centrilobular and paraseptal emphysema at the lung bases. There is coronary arteriosclerosis visualized along the included RCA. Hepatobiliary: Steatosis of the liver. Stable 4.7 x 3.5 x 3.6 cm hypodense lesion of the left hepatic lobe unchanged from prior 02/18/2017  study but new since 2011. Repeat delayed imaging demonstrates no significant enhancement. No biliary dilatation is identified. The gallbladder contains tiny layering calculi near the neck. No secondary signs of acute cholecystitis. Pancreas: Pancreas is slightly fatty in appearance without ductal dilatation or inflammation. Spleen: Normal size spleen without mass. Adrenals/Urinary Tract: Indeterminate hypodense nodule of the posterior limb of the left adrenal gland measuring 15 x 12 mm, not apparent on prior studies and may be new. Hypodense simple cyst of the mid to upper right kidney measuring 6.8 cm in diameter is stable. No enhancing mass lesions of the kidneys. No nephrolithiasis nor hydroureteronephrosis. The urinary bladder is decompressed without focal mural thickening or enhancement. Stomach/Bowel: Nondistended stomach. Redemonstration of heterogeneously enhancing partially necrotic appearing mass centered within the right abdominal mesentery measuring approximately 19.6 x 17.6 x 16.1 cm in transverse by craniocaudad by AP dimension, relatively stable having previously measured 25.3 x 16.5 x 17.8 cm in 2018. No bowel obstruction or inflammation is noted. Descending and sigmoid  diverticulosis is identified without acute diverticulitis. Vascular/Lymphatic: Atherosclerotic nonaneurysmal abdominal aorta with patent portal, splenic, hepatic and renal veins. No pathologically enlarged lymph nodes. Reproductive: Top-normal size prostate. Other: No pneumoperitoneum.  No ascites. Musculoskeletal: Thoracolumbar spondylosis without aggressive osseous lesions. IMPRESSION: 1. Descending and sigmoid diverticulosis without acute diverticulitis. The diverticulosis may be contributing to the lesions of the rectum. No definite annular constricting lesions are visualized. 2. Stable heterogeneous partially necrotic appearing mass centered within the mesentery of the right hemiabdomen currently estimated at the 19.6 x 17.6 x 16  1 cm. 3. Nonenhancing hypodense lesions possible a complex cyst or hemangioma, stable since 2018 currently estimated at 4.7 x 3.5 x 3.6 cm. Further correlation could performed with MRI to better evaluate as this is not apparent in 2011 and to exclude the possibility of a metastatic focus. Electronically Signed   By: Ashley Royalty M.D.   On: 05/13/2018 18:16        Scheduled Meds: . sodium chloride   Intravenous Once  . sodium chloride   Intravenous Once  . budesonide  0.25 mg Nebulization BID  . iopamidol      . ipratropium-albuterol  3 mL Nebulization QID  . simvastatin  20 mg Oral q1800   Continuous Infusions:   LOS: 1 day     Cordelia Poche, MD [facility] 05/14/2018, 10:38 AM Pager: [phone]  If 7PM-7AM, please contact night-coverage [email or web address] 05/14/2018, 10:38 AM

## 2018-05-14 NOTE — Consult Note (Addendum)
Consultation  Referring Provider:  Dr. Lonny [name]    Primary Care Physician:  Andrew Hatchet, MD Primary Gastroenterologist:  Dr. Fuller [name]    Reason for Consultation: Rectal bleeding, abnormal CT, symptomatic anemia            Impression / [name]:   Impression: 1.  Rectal bleeding: Suspected diverticular bleed, 5 episodes of continued bleeding, the last about 15 minutes ago, large amount of bright red blood and now some clots 2.  Abnormal CT of the abdomen: Consistent with known intra-abdominal sarcoma 3.  Symptomatic anemia: hgb 6.8--> 2 units PRBCs--> 7.7 4.  COPD on 6 L O2 5.  CHF  [name]: 1.  Continue to monitor hemoglobin and transfusion as needed less than 7 2.  Continue supportive measures 3.  Ordered CT angiogram stat due to continued large amounts of bright red blood per rectum 4.  Clear liquid diet for now 5.  Please await further recommendations from Dr. Carlean [name] later today after imaging above  Thank you for your kind consultation, we will continue to follow.  [name]  05/14/2018, 8:40 AM Pager #: [phone]    [facility] Attending   I have taken an interval history, reviewed the chart and examined the patient. I agree with the Advanced Practitioner's note, impression and recommendations.   No stools since about 0900/no bleeding since then  CT angio did not demonstrate active bleeding.  Clinical scenario fits with diverticulosis of colon and hemorrhage causing acute blood loss anemia sigmoid diverticulosis on 2014 colonoscopy.  At this point continue supportive care - if he fails to stop bleeding then consider attempting colonoscopy with anesthesia support hopefully MAC but with his O2 requirements could need intubation which then creates another set of issues I.e. when would he be able to come off breathing support and be extubated (or not?)  Note I believe there is a transcription error in CT and/pelvis report and I think radiologist means  diverticulosis may be contribyuting to bleeding from the rectum not lesions of the rectum. There are no rectal lesions.  Andrew Mayer, MD, [facility] 05/14/2018 11:53 AM [phone]    HPI:   [name] is a 72 y.o. male with known history of primary intra-abdominal sarcoma with recent ablation by IR 04/24/2018 at [facility], COPD on 6 L of oxygen, diastolic CHF, history of CAD status post stents, who presented to the ER on 05/13/2018 with rectal bleeding which it started yesterday with cramping abdominal pain.    Today, explains he had an episode of bright red blood from his rectum yesterday which lasted for almost 30 minutes on the toilet.  After that he began feeling more tired and short of breath, had another bowel movement prior to admission with "much less blood".  Since being in the hospital has had 3-4 more bowel movements, 3 just this morning with the last one a 1/2-hour ago, continues with a large amount of bright red blood and some clots now per nursing.  Does describe preceding abdominal cramping in the right side of his abdomen about 2 days ago, but this was gone at time of rectal bleeding starting.  Currently takes aspirin.  Associated symptoms include fatigue and increased shortness of breath.    Past medical history significant for recent ablation as above.    Denies fever, chills, abdominal or rectal pain.  ER course: Noted to have hemoglobin down by 2 unit   Hemoccult positive from below  Previous GI  history: 07/11/2013 colonoscopy, Dr. Fuller [name]: 3 sessile polyps, ascending, transverse and sigmoid colon as well as mild diverticulosis in the sigmoid colon, Path showing tubular adenomas, repeat recommended in 5 years 08/27/2004 EGD, Dr. Fuller [name]: Done for reflux symptoms: Erosions present and erythematous mucosa in the antrum and pyloric sphincter  Past Medical History:  Diagnosis Date  . Anxiety   . Bladder cancer ([place]) 2011  . Borderline diabetes  mellitus   . CAD (coronary artery disease)   . Cigarette smoker   . COPD (chronic obstructive pulmonary disease) ([place])   . DJD (degenerative joint disease)   . Emphysema of lung ([place])   . Epistaxis   . History of colonic polyps 2004   hyperplastic   . History of sinusitis   . Hypercholesteremia   . Hypertension   . Obesity     Past Surgical History:  Procedure Laterality Date  . cataract surgery  septemeber 2013  . cataract surgery/sub posterior vitrectomy in left eye  1996   Dr. Zadie [name]  . PTCA  [phone]    Family History  Problem Relation Age of Onset  . Heart disease Mother   . Heart disease Brother   . Cancer Brother   . Aneurysm Brother      Social History   Tobacco Use  . Smoking status: Former Smoker    Packs/day: 2.00    Years: 45.00    Pack years: 90.00    Types: Cigarettes    Last attempt to quit: 10/17/2015    Years since quitting: 2.5  . Smokeless tobacco: Never Used  Substance Use Topics  . Alcohol use: Yes    Alcohol/week: 0.0 oz    Comment: glass of wine at night  . Drug use: No    Prior to Admission medications   Medication Sig Start Date End Date Taking? Authorizing Provider  ALPRAZolam Duanne Moron) 0.5 MG tablet Take 0.5 mg by mouth at bedtime.    Yes [provider]  Ascorbic Acid (VITAMIN C) 1000 MG tablet Take 1,000 mg by mouth every evening.    Yes [provider]  aspirin 81 MG tablet Take 81 mg by mouth at bedtime.    Yes [provider]  budesonide (PULMICORT) 0.25 MG/2ML nebulizer solution Take 2 mLs (0.25 mg total) by nebulization 2 (two) times daily. 01/10/18  Yes Noralee Space, MD  cholecalciferol (VITAMIN D) 400 units TABS tablet Take 400 Units by mouth at bedtime.   Yes [provider]  CINNAMON PO Take 1,000 mg by mouth every evening.    Yes [provider]  Flaxseed, Linseed, (FLAX SEED OIL PO) Take 1,400 mg by mouth daily.   Yes [provider]  furosemide (LASIX) 40 MG  tablet Take 40 mg by mouth daily.    Yes [provider]  Garlic 9892 MG CAPS Take 1,000 mg by mouth every evening.    Yes [provider]  ibuprofen (ADVIL,MOTRIN) 600 MG tablet Take 600 mg by mouth every 6 (six) hours as needed for moderate pain.   Yes [provider]  ipratropium-albuterol (DUONEB) 0.5-2.5 (3) MG/3ML SOLN Take 3 mLs by nebulization 4 (four) times daily. 01/10/18  Yes Noralee Space, MD  isosorbide mononitrate (IMDUR) 30 MG 24 hr tablet Take 1 tablet (30 mg total) by mouth daily. 03/16/16  Yes Weaver, Scott T, PA-C  lisinopril (PRINIVIL,ZESTRIL) 10 MG tablet Take 10 mg by mouth daily. 12/05/16  Yes [provider]  methocarbamol (ROBAXIN) 500 MG tablet TAKE 1  TO 2 TABLETS BY MOUTH TWICE A DAY AS NEEDED FOR MUSCLE CRAMPS 03/07/18  Yes [provider]  multivitamin-lutein (OCUVITE-LUTEIN) CAPS capsule Take 1 capsule by mouth 2 (two) times daily.   Yes [provider]  Omega-3 Fatty Acids (FISH OIL) 1000 MG CPDR Take 1,000 mg by mouth daily.   Yes [provider]  Potassium 99 MG TABS Take 1 tablet by mouth 2 (two) times daily.   Yes [provider]  simvastatin (ZOCOR) 20 MG tablet Take 1 tablet (20 mg total) by mouth daily at 6 PM. 11/19/15  Yes Weaver, Nicki Reaper T, PA-C  vitamin B-12 (CYANOCOBALAMIN) 1000 MCG tablet Take 1,000 mcg by mouth daily.   Yes [provider]  Albuterol Sulfate (PROAIR RESPICLICK) 629 (90 Base) MCG/ACT AEPB Inhale 2 puffs into the lungs every 6 (six) hours as needed (shortness of breath).    [provider]  nitroGLYCERIN (NITROSTAT) 0.4 MG SL tablet Place 1 tablet (0.4 mg total) under the tongue every 5 (five) minutes as needed for chest pain. 11/19/15   Richardson Dopp T, PA-C  Polyethyl Glycol-Propyl Glycol (SYSTANE OP) Apply 1 drop to eye daily as needed (dry eyes).    [provider]  potassium chloride SA (K-DUR,KLOR-CON) 20 MEQ tablet Take 1 tablet (20 mEq total) by  mouth daily. Patient not taking: Reported on 05/13/2018 01/27/17   Cherene Altes, MD  sodium chloride (OCEAN) 0.65 % SOLN nasal spray Place 1 spray into both nostrils as needed for congestion. 10/29/15   Cherene Altes, MD    Current Facility-Administered Medications  Medication Dose Route Frequency Provider Last Rate Last Dose  . 0.9 %  sodium chloride infusion (Manually program via Guardrails IV Fluids)   Intravenous Once Toy Baker, MD   Stopped at 05/13/18 2034  . 0.9 %  sodium chloride infusion (Manually program via Guardrails IV Fluids)   Intravenous Once Toy Baker, MD      . acetaminophen (TYLENOL) tablet 650 mg  650 mg Oral Q6H PRN Doutova, Anastassia, MD       Or  . acetaminophen (TYLENOL) suppository 650 mg  650 mg Rectal Q6H PRN Doutova, Anastassia, MD      . ALPRAZolam Duanne Moron) tablet 0.5 mg  0.5 mg Oral QHS PRN Toy Baker, MD   0.5 mg at 05/13/18 2303  . budesonide (PULMICORT) nebulizer solution 0.25 mg  0.25 mg Nebulization BID Toy Baker, MD   0.25 mg at 05/13/18 2153  . HYDROcodone-acetaminophen (NORCO/VICODIN) 5-325 MG per tablet 1-2 tablet  1-2 tablet Oral Q4H PRN Doutova, Anastassia, MD      . ipratropium-albuterol (DUONEB) 0.5-2.5 (3) MG/3ML nebulizer solution 3 mL  3 mL Nebulization QID Doutova, Anastassia, MD      . levalbuterol (XOPENEX) nebulizer solution 0.63 mg  0.63 mg Nebulization Q4H PRN Doutova, Anastassia, MD      . ondansetron (ZOFRAN) tablet 4 mg  4 mg Oral Q6H PRN Doutova, Anastassia, MD       Or  . ondansetron (ZOFRAN) injection 4 mg  4 mg Intravenous Q6H PRN Doutova, Anastassia, MD      . simvastatin (ZOCOR) tablet 20 mg  20 mg Oral q1800 Doutova, Anastassia, MD   20 mg at 05/13/18 2303    Allergies as of 05/13/2018 - Review Complete 05/13/2018  Allergen Reaction Noted  . Amlodipine Swelling 10/22/2016     Review of Systems:    Constitutional: No fever or chills Skin: No rash  Cardiovascular: No chest pain  Respiratory: +chronic SOB Gastrointestinal: See HPI and otherwise negative Genitourinary: No dysuria Neurological: +dizziness Musculoskeletal: No new muscle or joint pain Hematologic: No bruising Psychiatric: No history of depression or anxiety   Physical Exam:  Vital signs in last 24 hours: Temp:  [97.5 F (36.4 C)-99.5 F (37.5 C)] 98 F (36.7 C) (06/30 0806) Pulse Rate:  [79-106] 97 (06/30 0600) Resp:  [14-25] 21 (06/30 0600) BP: (92-137)/(45-92) 104/51 (06/30 0600) SpO2:  [69 %-100 %] 100 % (06/30 0600) Weight:  [222 lb 0.1 oz (100.7 kg)-230 lb (104.3 kg)] 222 lb 0.1 oz (100.7 kg) (06/29 2146) Last BM Date: 05/13/18 General:   Pleasant chronically ill-appearing Caucasian male appears to be in NAD, Well developed, Well nourished, alert and cooperative Head:  Normocephalic and atraumatic. Eyes:   PEERL, EOMI. No icterus. Conjunctiva pink. Ears:  Normal auditory acuity. Neck:  Supple Throat: Oral cavity and pharynx without inflammation, swelling or lesion. Lungs: Respirations even and unlabored. Lungs clear to auscultation bilaterally.   No wheezes, crackles, or rhonchi. 02 via [place] Heart: Normal S1, S2. No MRG. Regular rate and rhythm. No peripheral edema, cyanosis or pallor.  Abdomen:  Soft, nondistended. No rebound or guarding. Normal bowel sounds. +RLQ palpable mass ttp Rectal:  Not performed.  Msk:  Symmetrical without gross deformities. Peripheral pulses intact.  Extremities:  Without edema, no deformity or joint abnormality.  Neurologic:  Alert and  oriented x4;  grossly normal neurologically.  Skin:   Dry and intact without significant lesions or rashes. Psychiatric: Demonstrates good judgement and reason without abnormal affect or behaviors.   LAB RESULTS: Recent Labs    05/13/18 1639 05/13/18 2043 05/14/18 0542  WBC 8.0 9.5 8.0  HGB 7.5* 6.8* 7.7*  HCT 24.1* 21.4* 23.4*  PLT 217 221 163   BMET Recent Labs    05/13/18 1639 05/14/18 0542  NA 138 136  K  4.4 5.2*  CL 103 105  CO2 27 25  GLUCOSE 156* 153*  BUN 20 21  CREATININE 0.91 0.99  CALCIUM 8.5* 7.8*   LFT Recent Labs    05/14/18 0542  PROT 5.3*  ALBUMIN 2.9*  AST 31  ALT 29  ALKPHOS 32*  BILITOT 1.5*   STUDIES: Ct Abdomen Pelvis W Contrast  Result Date: 05/13/2018 CLINICAL DATA:  Bright red rectal bleeding this morning for 30 minutes. History of bladder neoplasm. EXAM: CT ABDOMEN AND PELVIS WITH CONTRAST TECHNIQUE: Multidetector CT imaging of the abdomen and pelvis was performed using the standard protocol following bolus administration of intravenous contrast. CONTRAST:  184m ISOVUE-300 IOPAMIDOL (ISOVUE-300) INJECTION 61% COMPARISON:  02/18/2017 and 07/02/2010 FINDINGS: Lower chest: Heart size is normal without pericardial effusion or thickening. Moderate centrilobular and paraseptal emphysema at the lung bases. There is coronary arteriosclerosis visualized along the included RCA. Hepatobiliary: Steatosis of the liver. Stable 4.7 x 3.5 x 3.6 cm hypodense lesion of the left hepatic lobe unchanged from prior 02/18/2017 study but new since 2011. Repeat delayed imaging demonstrates no significant enhancement. No biliary dilatation is identified. The gallbladder contains tiny layering calculi near the neck. No secondary signs of acute cholecystitis. Pancreas: Pancreas is slightly fatty in appearance without ductal dilatation or inflammation. Spleen: Normal size spleen without mass. Adrenals/Urinary Tract: Indeterminate hypodense nodule of the posterior limb of the left adrenal gland measuring 15 x 12 mm, not apparent on prior studies and may be new. Hypodense simple cyst of the mid to upper right kidney measuring 6.8 cm in diameter is stable. No enhancing mass lesions of  the kidneys. No nephrolithiasis nor hydroureteronephrosis. The urinary bladder is decompressed without focal mural thickening or enhancement. Stomach/Bowel: Nondistended stomach. Redemonstration of heterogeneously enhancing  partially necrotic appearing mass centered within the right abdominal mesentery measuring approximately 19.6 x 17.6 x 16.1 cm in transverse by craniocaudad by AP dimension, relatively stable having previously measured 25.3 x 16.5 x 17.8 cm in 2018. No bowel obstruction or inflammation is noted. Descending and sigmoid diverticulosis is identified without acute diverticulitis. Vascular/Lymphatic: Atherosclerotic nonaneurysmal abdominal aorta with patent portal, splenic, hepatic and renal veins. No pathologically enlarged lymph nodes. Reproductive: Top-normal size prostate. Other: No pneumoperitoneum.  No ascites. Musculoskeletal: Thoracolumbar spondylosis without aggressive osseous lesions. IMPRESSION: 1. Descending and sigmoid diverticulosis without acute diverticulitis. The diverticulosis may be contributing to the lesions of the rectum. No definite annular constricting lesions are visualized. 2. Stable heterogeneous partially necrotic appearing mass centered within the mesentery of the right hemiabdomen currently estimated at the 19.6 x 17.6 x 16 1 cm. 3. Nonenhancing hypodense lesions possible a complex cyst or hemangioma, stable since 2018 currently estimated at 4.7 x 3.5 x 3.6 cm. Further correlation could performed with MRI to better evaluate as this is not apparent in 2011 and to exclude the possibility of a metastatic focus. Electronically Signed   By: Ashley Royalty M.D.   On: 05/13/2018 18:16

## 2018-05-14 NOTE — Progress Notes (Addendum)
CRITICAL VALUE ALERT  Critical Value:  Hgb 6.8  Date & Time Notied:  05/14/2018   1435  Provider Notified: Lonny Prude, MD  Orders Received/Actions taken: Transfuse 2 units PRBC

## 2018-05-15 LAB — HEMOGLOBIN AND HEMATOCRIT, BLOOD
HCT: 26.3 % — ABNORMAL LOW (ref 39.0–52.0)
Hemoglobin: 8.6 g/dL — ABNORMAL LOW (ref 13.0–17.0)

## 2018-05-15 LAB — CBC
HEMATOCRIT: 21.7 % — AB (ref 39.0–52.0)
HEMATOCRIT: 21.1 % — AB (ref 39.0–52.0)
HEMOGLOBIN: 7.2 g/dL — AB (ref 13.0–17.0)
HEMOGLOBIN: 7.1 g/dL — AB (ref 13.0–17.0)
MCH: 30.1 pg (ref 26.0–34.0)
MCH: 30.7 pg (ref 26.0–34.0)
MCHC: 33.2 g/dL (ref 30.0–36.0)
MCHC: 33.6 g/dL (ref 30.0–36.0)
MCV: 90.8 fL (ref 78.0–100.0)
MCV: 91.3 fL (ref 78.0–100.0)
Platelets: 132 10*3/uL — ABNORMAL LOW (ref 150–400)
Platelets: 148 10*3/uL — ABNORMAL LOW (ref 150–400)
RBC: 2.39 MIL/uL — AB (ref 4.22–5.81)
RBC: 2.31 MIL/uL — AB (ref 4.22–5.81)
RDW: 19.2 % — ABNORMAL HIGH (ref 11.5–15.5)
RDW: 19.4 % — ABNORMAL HIGH (ref 11.5–15.5)
WBC: 10.2 10*3/uL (ref 4.0–10.5)
WBC: 10.5 10*3/uL (ref 4.0–10.5)

## 2018-05-15 LAB — PREPARE RBC (CROSSMATCH)

## 2018-05-15 MED ORDER — METHOCARBAMOL 500 MG PO TABS
500.0000 mg | ORAL_TABLET | Freq: Three times a day (TID) | ORAL | Status: DC | PRN
  Administered 2018-05-15 – 2018-05-17 (×3): 500 mg via ORAL
  Filled 2018-05-15 (×3): qty 1

## 2018-05-15 MED ORDER — PEG-KCL-NACL-NASULF-NA ASC-C 100 G PO SOLR
0.5000 | Freq: Once | ORAL | Status: AC
  Administered 2018-05-16: 100 g via ORAL
  Filled 2018-05-15: qty 1

## 2018-05-15 MED ORDER — SIMVASTATIN 10 MG PO TABS
20.0000 mg | ORAL_TABLET | Freq: Every day | ORAL | Status: DC
  Administered 2018-05-15 – 2018-05-17 (×3): 20 mg via ORAL
  Filled 2018-05-15 (×3): qty 2

## 2018-05-15 MED ORDER — SODIUM CHLORIDE 0.9% IV SOLUTION
Freq: Once | INTRAVENOUS | Status: AC
  Administered 2018-05-15: 14:00:00 via INTRAVENOUS

## 2018-05-15 MED ORDER — GUAIFENESIN ER 600 MG PO TB12
1200.0000 mg | ORAL_TABLET | Freq: Two times a day (BID) | ORAL | Status: DC
  Administered 2018-05-15 – 2018-05-18 (×7): 1200 mg via ORAL
  Filled 2018-05-15 (×7): qty 2

## 2018-05-15 MED ORDER — HYDRALAZINE HCL 20 MG/ML IJ SOLN: 5.0000 mg | Freq: Four times a day (QID) | INTRAMUSCULAR | Status: DC | PRN

## 2018-05-15 MED ORDER — PEG-KCL-NACL-NASULF-NA ASC-C 100 G PO SOLR
0.5000 | Freq: Once | ORAL | Status: AC
Start: 2018-05-15 — End: 2018-05-15
  Administered 2018-05-15: 100 g via ORAL
  Filled 2018-05-15: qty 1

## 2018-05-15 MED ORDER — BOOST / RESOURCE BREEZE PO LIQD CUSTOM
1.0000 | Freq: Three times a day (TID) | ORAL | Status: DC
  Administered 2018-05-15 – 2018-05-18 (×6): 1 via ORAL

## 2018-05-15 MED ORDER — PEG-KCL-NACL-NASULF-NA ASC-C 100 G PO SOLR: 1.0000 | Freq: Two times a day (BID) | ORAL | Status: DC

## 2018-05-15 NOTE — Progress Notes (Signed)
[facility] Progress Note   Chief Complaint:   Rectal bleeding    SUBJECTIVE:   Passing dark red blood clots this am , "stomach is weak"     ASSESSMENT AND PLAN:    1. Painless rectal bleeding. Negative CTA. Got additional two units of blood last night (total of 4) and after 2 units hgb up to only 7.2 this am. Still passing dark clots this am. Slightly tachy but VS otherwise stable. Suspect diverticular bleed -getting repeat CBC now. If hgb still down will get more blood.  -If bleeding doesn't stop he may need colonoscopy with MAC. Keeping on clears  2. Chronic respiratory failure on chronic 02  3, Intra-abdominal sarcoma   OBJECTIVE:     Vital signs in last 24 hours: Temp:  [97.9 F (36.6 C)-98.7 F (37.1 C)] 98.2 F (36.8 C) (07/01 0800) Pulse Rate:  [96-125] 96 (07/01 0600) Resp:  [14-38] 20 (07/01 0600) BP: (105-170)/(36-113) 145/80 (07/01 0400) SpO2:  [96 %-100 %] 100 % (07/01 0750) Last BM Date: 05/15/18 General:   Alert, male in NAD. Daughter at bedside EENT:  Normal hearing, non icteric sclera, conjunctive pink.  Heart:  Mild tachy , regular rhythm; no murmurs. Trace bilateral lower extremity edema Pulm: Normal respiratory effort on 02 per [place]. Abdomen:  Soft, nondistended, moderate right mid / RLQ tenderness.   Normal bowel sounds  Neurologic:  Alert and  oriented x4;  grossly normal neurologically. Psych:  Pleasant, cooperative.  Normal mood and affect.   Intake/Output from previous day: 06/30 0701 - 07/01 0700 In: [place] [P.O.:1320; I.V.:75; Blood:325] Out: 466 [Urine:650; Stool:1] Intake/Output this shift: No intake/output data recorded.  Lab Results: Recent Labs    05/14/18 1406 05/14/18 2243 05/15/18 0449  WBC 11.8* 11.0* 10.2  HGB 6.8* 8.0* 7.2*  HCT 20.7* 24.4* 21.7*  PLT 196 149* 132*   BMET Recent Labs    05/13/18 1639 05/14/18 0542  NA 138 136  K 4.4 5.2*  CL 103 105  CO2 27 25  GLUCOSE 156* 153*  BUN 20  21  CREATININE 0.91 0.99  CALCIUM 8.5* 7.8*   LFT Recent Labs    05/14/18 0542  PROT 5.3*  ALBUMIN 2.9*  AST 31  ALT 29  ALKPHOS 32*  BILITOT 1.5*   PT/INR No results for input(s): LABPROT, INR in the last 72 hours. Hepatitis Panel No results for input(s): HEPBSAG, HCVAB, HEPAIGM, HEPBIGM in the last 72 hours.  Ct Abdomen Pelvis W Contrast  Result Date: 05/13/2018 CLINICAL DATA:  Bright red rectal bleeding this morning for 30 minutes. History of bladder neoplasm. EXAM: CT ABDOMEN AND PELVIS WITH CONTRAST TECHNIQUE: Multidetector CT imaging of the abdomen and pelvis was performed using the standard protocol following bolus administration of intravenous contrast. CONTRAST:  157m ISOVUE-300 IOPAMIDOL (ISOVUE-300) INJECTION 61% COMPARISON:  02/18/2017 and 07/02/2010 FINDINGS: Lower chest: Heart size is normal without pericardial effusion or thickening. Moderate centrilobular and paraseptal emphysema at the lung bases. There is coronary arteriosclerosis visualized along the included RCA. Hepatobiliary: Steatosis of the liver. Stable 4.7 x 3.5 x 3.6 cm hypodense lesion of the left hepatic lobe unchanged from prior 02/18/2017 study but new since 2011. Repeat delayed imaging demonstrates no significant enhancement. No biliary dilatation is identified. The gallbladder contains tiny layering calculi near the neck. No secondary signs of acute cholecystitis. Pancreas: Pancreas is slightly fatty in appearance without ductal dilatation or inflammation. Spleen: Normal size spleen without mass. Adrenals/Urinary Tract: Indeterminate hypodense nodule of the  posterior limb of the left adrenal gland measuring 15 x 12 mm, not apparent on prior studies and may be new. Hypodense simple cyst of the mid to upper right kidney measuring 6.8 cm in diameter is stable. No enhancing mass lesions of the kidneys. No nephrolithiasis nor hydroureteronephrosis. The urinary bladder is decompressed without focal mural thickening  or enhancement. Stomach/Bowel: Nondistended stomach. Redemonstration of heterogeneously enhancing partially necrotic appearing mass centered within the right abdominal mesentery measuring approximately 19.6 x 17.6 x 16.1 cm in transverse by craniocaudad by AP dimension, relatively stable having previously measured 25.3 x 16.5 x 17.8 cm in 2018. No bowel obstruction or inflammation is noted. Descending and sigmoid diverticulosis is identified without acute diverticulitis. Vascular/Lymphatic: Atherosclerotic nonaneurysmal abdominal aorta with patent portal, splenic, hepatic and renal veins. No pathologically enlarged lymph nodes. Reproductive: Top-normal size prostate. Other: No pneumoperitoneum.  No ascites. Musculoskeletal: Thoracolumbar spondylosis without aggressive osseous lesions. IMPRESSION: 1. Descending and sigmoid diverticulosis without acute diverticulitis. The diverticulosis may be contributing to the lesions of the rectum. No definite annular constricting lesions are visualized. 2. Stable heterogeneous partially necrotic appearing mass centered within the mesentery of the right hemiabdomen currently estimated at the 19.6 x 17.6 x 16 1 cm. 3. Nonenhancing hypodense lesions possible a complex cyst or hemangioma, stable since 2018 currently estimated at 4.7 x 3.5 x 3.6 cm. Further correlation could performed with MRI to better evaluate as this is not apparent in 2011 and to exclude the possibility of a metastatic focus. Electronically Signed   By: Ashley Royalty M.D.   On: 05/13/2018 18:16   Ct Angio Abd/pel W/ And/or W/o  Result Date: 05/14/2018 CLINICAL DATA:  GI bleeding with bloody stool EXAM: CTA ABDOMEN AND PELVIS wITHOUT AND WITH CONTRAST TECHNIQUE: Multidetector CT imaging of the abdomen and pelvis was performed using the standard protocol during bolus administration of intravenous contrast. Multiplanar reconstructed images and MIPs were obtained and reviewed to evaluate the vascular anatomy.  CONTRAST:  118m ISOVUE-370 IOPAMIDOL (ISOVUE-370) INJECTION 76% COMPARISON:  CT from yesterday FINDINGS: VASCULAR Aorta: Atheromatous plaque. Undulating contour without aneurysm or dissection. Celiac: Atheromatous plaque at the origin with mild-to-moderate narrowing. No branch occlusion or aneurysm SMA: Atherosclerosis but widely patent. Renals: Bilateral accessory renal arteries. Atherosclerotic plaque narrows bilateral renal arteries without asymmetric enhancement IMA: Small but patent vessel Inflow: Diffuse atherosclerotic calcification. Advanced narrowing at the origin of the left common iliac due to a calcified plaque Proximal Outflow: Extensive atherosclerotic plaque on the bilateral common femoral and superficial femoral arteries with at least moderate narrowing. Veins: Patent Review of the MIP images confirms the above findings. NON-VASCULAR Lower chest: Coronary atherosclerosis.  Emphysema. Hepatobiliary: Low-density subcapsular mass in the left liver measuring up to 55 mm, [facility]. Pancreas: Unremarkable. Spleen: Unremarkable. Adrenals/Urinary Tract: 11 mm left adrenal nodule, known from outside imaging reports. Simple right renal cyst. No hydronephrosis or stone. Unremarkable bladder. Stomach/Bowel: Extensive sigmoid diverticulosis. Milder diverticulosis seen elsewhere. No visible intraluminal active hemorrhage; high-density contents at the rectum does not change between the phases and is not associated with visible abnormal vessels. 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 Lymphatic: No enlarged lymph nodes Reproductive:Negative Other: No ascites or pneumoperitoneum. Musculoskeletal: Degenerative changes without acute or aggressive finding. IMPRESSION: 1. No specific explanation for GI bleeding. No evidence of active hemorrhage.  2. Extensive distal colonic diverticulosis. 3. 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 No visible bowel fistulization to explain the presentation. 4. Left hepatic and left adrenal nodules, suspected metastases based on outside imaging reports. 5. Cholelithiasis. Electronically Signed   By: Monte Fantasia M.D.   On: 05/14/2018 11:27    Active Problems:   Malignant neoplasm of bladder (HCC)   Coronary atherosclerosis of native coronary artery   COPD mixed type (HCC)   Macrocytic anemia   Chronic respiratory failure with hypoxia and hypercapnia (HCC)   OSA treated with BiPAP   Rectal bleeding   Chronic diastolic CHF (congestive heart failure) ([place])   Lower GI bleed     LOS: 2 days   Tye Savoy ,NP 05/15/2018, 10:15 AM

## 2018-05-15 NOTE — Progress Notes (Signed)
PT Cancellation Note  Patient Details Name: [name] MRN: [id number] DOB: 06-12-1946   Cancelled Treatment:    Reason Eval/Treat Not Completed: Medical issues which prohibited therapy, per RN, still bleeding and  Maybe to get blood. check back tomorrow.   Claretha Cooper 05/15/2018, 12:35 PM Tresa Endo PT [phone]

## 2018-05-15 NOTE — Progress Notes (Signed)
PROGRESS NOTE    [name]  XFG:[id number] DOB: 1946/07/13 DOA: 05/13/2018 PCP: [name], MD   Brief Narrative: [name] is a 72 y.o. 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 Patient presented secondary to bloody stools likely with a diverticular bleed.     Assessment & Plan:   Active Problems:   Malignant neoplasm of bladder (HCC)   Coronary atherosclerosis of native coronary artery   COPD mixed type (HCC)   Macrocytic anemia   Chronic respiratory failure with hypoxia and hypercapnia (HCC)   OSA treated with BiPAP   Rectal bleeding   Chronic diastolic CHF (congestive heart failure) (HCC)   Lower GI bleed   GI bleeding Likely diverticular however patient does take ibuprofen as an outpatient. Continuing to bleed. Has required 2 units of PRBC to date. Hemoglobin down to 7.2 today. -Continue repeat CBCs -Transfuse for hemoglobin less than 8 -Transfuse 1 unit PRBC  Chronic diastolic heart failure Asymptomatic. Lisinopril, lasix held  OSA Bipap  Spindle cell sarcoma Stable. Outpatient follow-up.  COPD Chronic respiratory failure with hypoxia and hypercapnia On chronic oxygen. Stable. 6L at baseline. -Continue oxygen therapy  CAD s/p stents. Lisinopril, Imdur held secondary to acute GI bleeding -Continue Simvastatin  Hyperkalemia Mild. -Repeat BMP in AM   DVT prophylaxis: SCDs Code Status:   Code Status: Full Code Family Communication: Wife at bedside Disposition Plan: Discharge pending resolution of GI bleeding and stabilization of hemoglobin    Consultants:   [facility]  Procedures:   None  Antimicrobials:  None    Subjective: Bleeding improved today. Mild blood in bowel movement.  Objective: Vitals:   05/15/18 1215 05/15/18 1300 05/15/18 1329 05/15/18 1330  BP: 130/73 140/73    Pulse: (!) 102 (!) 103  (!) 110  Resp: (!) 21 (!) 23   (!) 24  Temp:   98.1 F (36.7 C)   TempSrc:   Oral   SpO2: 100% 100%  98%  Weight:      Height:        Intake/Output Summary (Last 24 hours) at 05/15/2018 1334 Last data filed at 05/15/2018 0600 Gross per 24 hour  Intake 805 ml  Output 650 ml  Net 155 ml   Filed Weights   05/13/18 1535 05/13/18 2146  Weight: 104.3 kg (230 lb) 100.7 kg (222 lb 0.1 oz)    Examination:  General exam: Appears calm and comfortable Respiratory system: Clear to auscultation. Respiratory effort normal. Cardiovascular system: S1 & S2 heard, RRR. No murmurs, rubs, gallops or clicks. Gastrointestinal system: Abdomen is nondistended, soft and nontender. RLQ mass. Normal bowel sounds heard. Central nervous system: Alert and oriented. No focal neurological deficits. Extremities: No edema. No calf tenderness Skin: No cyanosis. No rashes Psychiatry: Judgement and insight appear normal. Mood & affect appropriate.     Data Reviewed: I have personally reviewed following labs and imaging studies  CBC: Recent Labs  Lab 05/14/18 0542 05/14/18 1406 05/14/18 2243 05/15/18 0449 05/15/18 1041  WBC 8.0 11.8* 11.0* 10.2 10.5  HGB 7.7* 6.8* 8.0* 7.2* 7.1*  HCT 23.4* 20.7* 24.4* 21.7* 21.1*  MCV 93.6 90.8 91.4 90.8 91.3  PLT 163 196 149* 132* 967*   Basic Metabolic Panel: Recent Labs  Lab 05/13/18 1639 05/14/18 0542  NA 138 136  K 4.4 5.2*  CL 103 105  CO2 27 25  GLUCOSE 156* 153*  BUN 20 21  CREATININE 0.91 0.99  CALCIUM 8.5* 7.8*  MG  --  1.7  PHOS  --  3.6   GFR: Estimated Creatinine Clearance: 73.9 mL/min (by C-G formula based on SCr of 0.99 mg/dL). Liver Function Tests: Recent Labs  Lab 05/13/18 1639 05/14/18 0542  AST 37 31  ALT 38 29  ALKPHOS 43 32*  BILITOT 1.4* 1.5*  PROT 6.7 5.3*  ALBUMIN 3.5 2.9*   No results for input(s): LIPASE, AMYLASE in the last 168 hours. No results for input(s): AMMONIA in the last 168 hours. Coagulation Profile: No results for input(s): INR, PROTIME  in the last 168 hours. Cardiac Enzymes: Recent Labs  Lab 05/13/18 2043 05/14/18 0542 05/14/18 1226  TROPONINI <0.03 <0.03 <0.03   BNP (last 3 results) No results for input(s): PROBNP in the last 8760 hours. HbA1C: No results for input(s): HGBA1C in the last 72 hours. CBG: No results for input(s): GLUCAP in the last 168 hours. Lipid Profile: No results for input(s): CHOL, HDL, LDLCALC, TRIG, CHOLHDL, LDLDIRECT in the last 72 hours. Thyroid Function Tests: Recent Labs    05/14/18 1226  TSH 4.399   Anemia Panel: Recent Labs    05/13/18 2043  VITAMINB12 1,000*  FOLATE 23.1  FERRITIN 310  TIBC 270  IRON 137  RETICCTPCT 7.3*   Sepsis Labs: No results for input(s): PROCALCITON, LATICACIDVEN in the last 168 hours.  Recent Results (from the past 240 hour(s))  MRSA PCR Screening     Status: None   Collection Time: 05/13/18 10:15 PM  Result Value Ref Range Status   MRSA by PCR NEGATIVE NEGATIVE Final    Comment:        The GeneXpert MRSA Assay (FDA approved for NASAL specimens only), is one component of a comprehensive MRSA colonization surveillance program. It is not intended to diagnose MRSA infection nor to guide or monitor treatment for MRSA infections. Performed at [facility], [place] [address] [place], [place] [address]          Radiology Studies: Ct Abdomen Pelvis W Contrast  Result Date: 05/13/2018 CLINICAL DATA:  Bright red rectal bleeding this morning for 30 minutes. History of bladder neoplasm. EXAM: CT ABDOMEN AND PELVIS WITH CONTRAST TECHNIQUE: Multidetector CT imaging of the abdomen and pelvis was performed using the standard protocol following bolus administration of intravenous contrast. CONTRAST:  139mL ISOVUE-300 IOPAMIDOL (ISOVUE-300) INJECTION 61% COMPARISON:  02/18/2017 and 07/02/2010 FINDINGS: Lower chest: Heart size is normal without pericardial effusion or thickening. Moderate centrilobular and paraseptal emphysema at the  lung bases. There is coronary arteriosclerosis visualized along the included RCA. Hepatobiliary: Steatosis of the liver. Stable 4.7 x 3.5 x 3.6 cm hypodense lesion of the left hepatic lobe unchanged from prior 02/18/2017 study but new since 2011. Repeat delayed imaging demonstrates no significant enhancement. No biliary dilatation is identified. The gallbladder contains tiny layering calculi near the neck. No secondary signs of acute cholecystitis. Pancreas: Pancreas is slightly fatty in appearance without ductal dilatation or inflammation. Spleen: Normal size spleen without mass. Adrenals/Urinary Tract: Indeterminate hypodense nodule of the posterior limb of the left adrenal gland measuring 15 x 12 mm, not apparent on prior studies and may be new. Hypodense simple cyst of the mid to upper right kidney measuring 6.8 cm in diameter is stable. No enhancing mass lesions of the kidneys. No nephrolithiasis nor hydroureteronephrosis. The urinary bladder is decompressed without focal mural thickening or enhancement. Stomach/Bowel: Nondistended stomach. Redemonstration of heterogeneously enhancing partially necrotic appearing mass centered within the right  abdominal mesentery measuring approximately 19.6 x 17.6 x 16.1 cm in transverse by craniocaudad by AP dimension, relatively stable having previously measured 25.3 x 16.5 x 17.8 cm in 2018. No bowel obstruction or inflammation is noted. Descending and sigmoid diverticulosis is identified without acute diverticulitis. Vascular/Lymphatic: Atherosclerotic nonaneurysmal abdominal aorta with patent portal, splenic, hepatic and renal veins. No pathologically enlarged lymph nodes. Reproductive: Top-normal size prostate. Other: No pneumoperitoneum.  No ascites. Musculoskeletal: Thoracolumbar spondylosis without aggressive osseous lesions. IMPRESSION: 1. Descending and sigmoid diverticulosis without acute diverticulitis. The diverticulosis may be contributing to the lesions of the  rectum. No definite annular constricting lesions are visualized. 2. Stable heterogeneous partially necrotic appearing mass centered within the mesentery of the right hemiabdomen currently estimated at the 19.6 x 17.6 x 16 1 cm. 3. Nonenhancing hypodense lesions possible a complex cyst or hemangioma, stable since 2018 currently estimated at 4.7 x 3.5 x 3.6 cm. Further correlation could performed with MRI to better evaluate as this is not apparent in 2011 and to exclude the possibility of a metastatic focus. Electronically Signed   By: Ashley Royalty M.D.   On: 05/13/2018 18:16   Ct Angio Abd/pel W/ And/or W/o  Result Date: 05/14/2018 CLINICAL DATA:  GI bleeding with bloody stool EXAM: CTA ABDOMEN AND PELVIS wITHOUT AND WITH CONTRAST TECHNIQUE: Multidetector CT imaging of the abdomen and pelvis was performed using the standard protocol during bolus administration of intravenous contrast. Multiplanar reconstructed images and MIPs were obtained and reviewed to evaluate the vascular anatomy. CONTRAST:  177mL ISOVUE-370 IOPAMIDOL (ISOVUE-370) INJECTION 76% COMPARISON:  CT from yesterday FINDINGS: VASCULAR Aorta: Atheromatous plaque. Undulating contour without aneurysm or dissection. Celiac: Atheromatous plaque at the origin with mild-to-moderate narrowing. No branch occlusion or aneurysm SMA: Atherosclerosis but widely patent. Renals: Bilateral accessory renal arteries. Atherosclerotic plaque narrows bilateral renal arteries without asymmetric enhancement IMA: Small but patent vessel Inflow: Diffuse atherosclerotic calcification. Advanced narrowing at the origin of the left common iliac due to a calcified plaque Proximal Outflow: Extensive atherosclerotic plaque on the bilateral common femoral and superficial femoral arteries with at least moderate narrowing. Veins: Patent Review of the MIP images confirms the above findings. NON-VASCULAR Lower chest: Coronary atherosclerosis.  Emphysema. Hepatobiliary: Low-density  subcapsular mass in the left liver measuring up to 55 mm, suspected metastasis by outside imaging reports that [facility].Cholelithiasis. Pancreas: Unremarkable. Spleen: Unremarkable. Adrenals/Urinary Tract: 11 mm left adrenal nodule, known from outside imaging reports. Simple right renal cyst. No hydronephrosis or stone. Unremarkable bladder. Stomach/Bowel: Extensive sigmoid diverticulosis. Milder diverticulosis seen elsewhere. No visible intraluminal active hemorrhage; high-density contents at the rectum does not change between the phases and is not associated with visible abnormal vessels. 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 Lymphatic: No enlarged lymph nodes Reproductive:Negative Other: No ascites or pneumoperitoneum. Musculoskeletal: Degenerative changes without acute or aggressive finding. IMPRESSION: 1. No specific explanation for GI bleeding. No evidence of active hemorrhage. 2. Extensive distal colonic diverticulosis. 3. 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 No visible bowel fistulization to explain the presentation. 4. Left hepatic and left adrenal nodules, suspected metastases based on outside imaging reports. 5. Cholelithiasis. Electronically Signed   By: Monte Fantasia M.D.   On: 05/14/2018 11:27        Scheduled Meds: . sodium chloride   Intravenous Once  . sodium chloride   Intravenous Once  . sodium chloride   Intravenous Once  .  sodium chloride   Intravenous Once  . budesonide  0.25 mg Nebulization BID  . guaiFENesin  1,200 mg Oral BID  . ipratropium-albuterol  3 mL Nebulization QID  . simvastatin  20 mg Oral QHS   Continuous Infusions:   LOS: 2 days     Cordelia Poche, MD [facility] 05/15/2018, 1:34 PM Pager: [phone]  If 7PM-7AM, please contact night-coverage [email or web address] 05/15/2018, 1:34 PM

## 2018-05-16 ENCOUNTER — Encounter (HOSPITAL_COMMUNITY): Payer: Self-pay | Admitting: Anesthesiology

## 2018-05-16 ENCOUNTER — Other Ambulatory Visit: Payer: Self-pay

## 2018-05-16 ENCOUNTER — Encounter (HOSPITAL_COMMUNITY)
Admission: EM | Disposition: A | Discharge status: Home / Self Care | Payer: Self-pay | Source: Home / Self Care | Attending: Family Medicine

## 2018-05-16 DIAGNOSIS — K625 Hemorrhage of anus and rectum: Secondary | ICD-10-CM

## 2018-05-16 LAB — BASIC METABOLIC PANEL
Anion gap: 6 (ref 5–15)
BUN: 15 mg/dL (ref 8–23)
CALCIUM: 7.7 mg/dL — AB (ref 8.9–10.3)
CO2: 25 mmol/L (ref 22–32)
CREATININE: 0.83 mg/dL (ref 0.61–1.24)
Chloride: 104 mmol/L (ref 98–111)
GFR calc Af Amer: >60 mL/min (ref >60)
GFR calc non Af Amer: >60 mL/min (ref >60)
Glucose, Bld: 135 mg/dL — ABNORMAL HIGH (ref 70–99)
Potassium: 5 mmol/L (ref 3.5–5.1)
SODIUM: 135 mmol/L (ref 135–145)

## 2018-05-16 LAB — PREPARE RBC (CROSSMATCH)

## 2018-05-16 LAB — CBC
HCT: 19.5 % — ABNORMAL LOW (ref 39.0–52.0)
Hemoglobin: 6.4 g/dL — CL (ref 13.0–17.0)
MCH: 29.8 pg (ref 26.0–34.0)
MCHC: 32.8 g/dL (ref 30.0–36.0)
MCV: 90.7 fL (ref 78.0–100.0)
PLATELETS: 116 10*3/uL — AB (ref 150–400)
RBC: 2.15 MIL/uL — ABNORMAL LOW (ref 4.22–5.81)
RDW: 17.7 % — AB (ref 11.5–15.5)
WBC: 7.7 10*3/uL (ref 4.0–10.5)

## 2018-05-16 LAB — HEMOGLOBIN AND HEMATOCRIT, BLOOD
HCT: 27.4 % — ABNORMAL LOW (ref 39.0–52.0)
HEMOGLOBIN: 9.2 g/dL — AB (ref 13.0–17.0)

## 2018-05-16 SURGERY — CANCELLED PROCEDURE

## 2018-05-16 MED ORDER — SODIUM CHLORIDE 0.9% IV SOLUTION
Freq: Once | INTRAVENOUS | Status: AC
  Administered 2018-05-16: 05:00:00 via INTRAVENOUS

## 2018-05-16 SURGICAL SUPPLY — 25 items

## 2018-05-16 NOTE — Progress Notes (Signed)
PT Cancellation Note  Patient Details Name: [name] MRN: [id number] DOB: September 20, 1946   Cancelled Treatment:    Reason Eval/Treat Not Completed: Medical issues which prohibited therapyNoted that HGB 6.4. Will check back when medically ready to mobilize.    Claretha Cooper 05/16/2018, 7:54 AM Tresa Endo PT [phone]

## 2018-05-16 NOTE — Progress Notes (Signed)
[facility] Progress Note    Since last GI note: The bleeding stopped while prepping for colonoscopy today. Last red blood output was about midnight last night (13-14 hours ago).  Just sat on commode and had typical appearing clear yellowish output.  Hb this AM dropped to 6.4, he received two more units of blood since then. Up to 7unit PRBC transfusion so far.  Objective: Vital signs in last 24 hours: Temp:  [97.6 F (36.4 C)-99.1 F (37.3 C)] 98.2 F (36.8 C) (07/02 1200) Pulse Rate:  [87-113] 93 (07/02 1145) Resp:  [14-26] 19 (07/02 1145) BP: (116-159)/(51-95) 130/70 (07/02 1145) SpO2:  [97 %-100 %] 100 % (07/02 1145) Last BM Date: 05/16/18 General: alert and oriented times 3 Sitting up, tachypnic on 6L [place] Heart: regular rate and rythm Abdomen: soft, non-tender, non-distended, normal bowel sounds   Lab Results: Recent Labs    05/15/18 0449 05/15/18 1041 05/15/18 2027 05/16/18 0307  WBC 10.2 10.5  --  7.7  HGB 7.2* 7.1* 8.6* 6.4*  PLT 132* 148*  --  116*  MCV 90.8 91.3  --  90.7   Recent Labs    05/13/18 1639 05/14/18 0542 05/16/18 0307  NA 138 136 135  K 4.4 5.2* 5.0  CL 103 105 104  CO2 27 25 25   GLUCOSE 156* 153* 135*  BUN 20 21 15   CREATININE 0.91 0.99 0.83  CALCIUM 8.5* 7.8* 7.7*   Recent Labs    05/13/18 1639 05/14/18 0542  PROT 6.7 5.3*  ALBUMIN 3.5 2.9*  AST 37 31  ALT 38 29  ALKPHOS 43 32*  BILITOT 1.4* 1.5*   Medications: Scheduled Meds: . sodium chloride   Intravenous Once  . sodium chloride   Intravenous Once  . sodium chloride   Intravenous Once  . budesonide  0.25 mg Nebulization BID  . feeding supplement  1 Container Oral TID BM  . guaiFENesin  1,200 mg Oral BID  . ipratropium-albuterol  3 mL Nebulization QID  . simvastatin  20 mg Oral QHS   Continuous Infusions: PRN Meds:.acetaminophen **OR** acetaminophen, albuterol, ALPRAZolam, hydrALAZINE, HYDROcodone-acetaminophen, methocarbamol, ondansetron **OR**  ondansetron (ZOFRAN) IV    Assessment/Plan: 72 y.o. male with overt lower GI bleeding  Diverticular vs. Related to known abd sarcoma?  He has severe COPD and so procedures with sedation are quite high risk for him. Fortunately the bleeding has stopped. Hopefully it will not resume!  He can have clear liquids for now. We will check on him early tomorrow and if still no overt rebleeding we will advance his diet. If he rebleeds, he needs colonoscopy with anesthesia assistance.   Milus Banister, MD  05/16/2018, 1:38 PM [facility] Pager [phone]

## 2018-05-16 NOTE — Progress Notes (Signed)
Initial Nutrition Assessment  DOCUMENTATION CODES:   Obesity unspecified  INTERVENTION:  - Diet advancement as medically feasible. - RD will monitor for ongoing nutrition-related needs, including need for additional diet education.  NUTRITION DIAGNOSIS:   Inadequate oral intake related to inability to eat as evidenced by NPO status.  GOAL:   Patient will meet greater than or equal to 90% of their needs  MONITOR:   Diet advancement, Weight trends, Labs, I & O's  REASON FOR ASSESSMENT:   Malnutrition Screening Tool, NPO/Clear Liquid Diet  ASSESSMENT:   72 y.o. 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 Patient presented secondary to bloody stools likely with a diverticular bleed.   BMI indicates obesity. Patient has been on CLD versus NPO x5 days. He states that last solid food was on the date of admission (6/29) when he had two tomato sandwiches with cucumbers and cantaloupe for lunch. He is feeling hungry at this time but otherwise denies abdomina discomfort or nausea. He reports that prior to admission he had a very good appetite and had no difficulties with eating.   Talked with patient and wife, who is at bedside, about appropriate foods during diverticulitis flare versus foods when flare is not occurring. Patient and wife are very appreciative and verbalize understanding of reason for changes to diet during a flare.   Patient states that SOB is his biggest issue at baseline. He states that his tumor was previously about the size of a watermelon and after radiation it was about the size of a football. He noticed great improvement in his breathing.   NFPE outlined below. Per chart review, pt has lost 4 lbs (1.8% body weight) in the past 1 month. This is not significant for time frame.  Per Dr. Lisbeth Ply note this AM: GIB thought to be 2/2 diverticulitis, possible colonoscopy today, OSA on  BiPAP, spindle cell sarcoma is currently stable with no inpatient intervention planned, COPD with hypoxia and hypercapnia, mild hyperkalemia now resolved.   Medications reviewed. Labs reviewed; Ca: 7.7 mg/dL.      NUTRITION - FOCUSED PHYSICAL EXAM:  Completed; no muscle and no fat wasting. No edema noted at this time.   Diet Order:   Diet Order           Diet NPO time specified Except for: Sips with Meds  Diet effective 0500 tomorrow          EDUCATION NEEDS:   Education needs have been addressed  Skin:  Skin Assessment: Reviewed RN Assessment  Last BM:  7/2  Height:   Ht Readings from Last 1 Encounters:  05/13/18 5\' 6"  (1.676 m)    Weight:   Wt Readings from Last 1 Encounters:  05/13/18 222 lb 0.1 oz (100.7 kg)    Ideal Body Weight:  64.54 kg  BMI:  Body mass index is 35.83 kg/m.  Estimated Nutritional Needs:   Kcal:  2115-2315 (21-23 kcal/kg)  Protein:  120-131 grams (1.2-1.3 grams/kg)  Fluid:  >/= 2.1 L/day     Jarome Matin, MS, RD, LDN, [facility] Inpatient Clinical Dietitian Pager # [phone] After hours/weekend pager # [phone]

## 2018-05-16 NOTE — Progress Notes (Signed)
PROGRESS NOTE    [name]  HWE:[id number] DOB: 1946/09/16 DOA: 05/13/2018 PCP: [name], MD   Brief Narrative: [name] is a 72 y.o. 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 Patient presented secondary to bloody stools likely with a diverticular bleed.     Assessment & Plan:   Active Problems:   Malignant neoplasm of bladder (HCC)   Coronary atherosclerosis of native coronary artery   COPD mixed type (HCC)   Macrocytic anemia   Chronic respiratory failure with hypoxia and hypercapnia (HCC)   OSA treated with BiPAP   Rectal bleeding   Chronic diastolic CHF (congestive heart failure) (HCC)   Lower GI bleed   GI bleeding Likely diverticular however patient does take ibuprofen as an outpatient. Continuing to bleed. Has required 5 units of PRBC to date. Hemoglobin down to 6.4 today down from 8.6 last night. Transfusing 2 units of PRBC -Continue repeat CBCs -Transfuse for hemoglobin less than 8 -GI recommendations: colonoscopy planned  Chronic diastolic heart failure Asymptomatic. Lisinopril, lasix held  OSA Bipap  Spindle cell sarcoma Stable. Outpatient follow-up.  COPD Chronic respiratory failure with hypoxia and hypercapnia On chronic oxygen. Stable. 6L at baseline. -Continue oxygen therapy  CAD s/p stents. Lisinopril, Imdur held secondary to acute GI bleeding -Continue Simvastatin  Hyperkalemia Mild. Resoled.   DVT prophylaxis: SCDs Code Status:   Code Status: Full Code Family Communication: Wife at bedside Disposition Plan: Discharge pending resolution of GI bleeding and stabilization of hemoglobin    Consultants:   [facility]  Procedures:   6/29: 2 units PRBC  6/30: 2 units PRBC  7/1: 1 unit PRBC  7/2: 2 units PRBC  Antimicrobials:  None    Subjective: Significant bleeding overnight. Some dyspnea today.  Objective: Vitals:   05/16/18 0747 05/16/18 0800 05/16/18 0900 05/16/18 0926  BP:   (!) 146/67 133/61  Pulse: 89  87 88  Resp: 18  20 20   Temp:  97.9 F (36.6 C) 98.1 F (36.7 C) 98.5 F (36.9 C)  TempSrc:  Axillary Oral Oral  SpO2: 99%  100% 100%  Weight:      Height:        Intake/Output Summary (Last 24 hours) at 05/16/2018 1127 Last data filed at 05/16/2018 0730 Gross per 24 hour  Intake 790.33 ml  Output -  Net 790.33 ml   Filed Weights   05/13/18 1535 05/13/18 2146  Weight: 104.3 kg (230 lb) 100.7 kg (222 lb 0.1 oz)    Examination:  General exam: Appears calm and comfortable Respiratory system: Clear to auscultation. Pursed lip breathing with increased breaths between sentences Cardiovascular system: S1 & S2 heard, RRR. No murmurs, rubs, gallops or clicks. Gastrointestinal system: Abdomen is nondistended, soft and non-tender. RLQ masses felt. Normal bowel sounds heard. Central nervous system: Alert and oriented. No focal neurological deficits. Extremities: No edema. No calf tenderness Skin: No cyanosis. No rashes Psychiatry: Judgement and insight appear normal. Mood & affect appropriate.     Data Reviewed: I have personally reviewed following labs and imaging studies  CBC: Recent Labs  Lab 05/14/18 1406 05/14/18 2243 05/15/18 0449 05/15/18 1041 05/15/18 2027 05/16/18 0307  WBC 11.8* 11.0* 10.2 10.5  --  7.7  HGB 6.8* 8.0* 7.2* 7.1* 8.6* 6.4*  HCT 20.7* 24.4* 21.7* 21.1* 26.3* 19.5*  MCV 90.8 91.4 90.8 91.3  --  90.7  PLT 196 149* 132* 148*  --  622*   Basic Metabolic Panel: Recent Labs  Lab 05/13/18 1639 05/14/18 0542 05/16/18 0307  NA 138 136 135  K 4.4 5.2* 5.0  CL 103 105 104  CO2 27 25 25   GLUCOSE 156* 153* 135*  BUN 20 21 15   CREATININE 0.91 0.99 0.83  CALCIUM 8.5* 7.8* 7.7*  MG  --  1.7  --   PHOS  --  3.6  --    GFR: Estimated Creatinine Clearance: 88.1 mL/min (by C-G formula based on SCr of 0.83 mg/dL). Liver Function Tests: Recent Labs  Lab  05/13/18 1639 05/14/18 0542  AST 37 31  ALT 38 29  ALKPHOS 43 32*  BILITOT 1.4* 1.5*  PROT 6.7 5.3*  ALBUMIN 3.5 2.9*   No results for input(s): LIPASE, AMYLASE in the last 168 hours. No results for input(s): AMMONIA in the last 168 hours. Coagulation Profile: No results for input(s): INR, PROTIME in the last 168 hours. Cardiac Enzymes: Recent Labs  Lab 05/13/18 2043 05/14/18 0542 05/14/18 1226  TROPONINI <0.03 <0.03 <0.03   BNP (last 3 results) No results for input(s): PROBNP in the last 8760 hours. HbA1C: No results for input(s): HGBA1C in the last 72 hours. CBG: No results for input(s): GLUCAP in the last 168 hours. Lipid Profile: No results for input(s): CHOL, HDL, LDLCALC, TRIG, CHOLHDL, LDLDIRECT in the last 72 hours. Thyroid Function Tests: Recent Labs    05/14/18 1226  TSH 4.399   Anemia Panel: Recent Labs    05/13/18 2043  VITAMINB12 1,000*  FOLATE 23.1  FERRITIN 310  TIBC 270  IRON 137  RETICCTPCT 7.3*   Sepsis Labs: No results for input(s): PROCALCITON, LATICACIDVEN in the last 168 hours.  Recent Results (from the past 240 hour(s))  MRSA PCR Screening     Status: None   Collection Time: 05/13/18 10:15 PM  Result Value Ref Range Status   MRSA by PCR NEGATIVE NEGATIVE Final    Comment:        The GeneXpert MRSA Assay (FDA approved for NASAL specimens only), is one component of a comprehensive MRSA colonization surveillance program. It is not intended to diagnose MRSA infection nor to guide or monitor treatment for MRSA infections. Performed at [facility], [place] [address] [place], [place] [address]          Radiology Studies: No results found.      Scheduled Meds: . sodium chloride   Intravenous Once  . sodium chloride   Intravenous Once  . sodium chloride   Intravenous Once  . budesonide  0.25 mg Nebulization BID  . feeding supplement  1 Container Oral TID BM  . guaiFENesin  1,200 mg Oral BID  .  ipratropium-albuterol  3 mL Nebulization QID  . simvastatin  20 mg Oral QHS   Continuous Infusions:   LOS: 3 days     Cordelia Poche, MD [facility] 05/16/2018, 11:27 AM Pager: [phone]  If 7PM-7AM, please contact night-coverage [email or web address] 05/16/2018, 11:27 AM

## 2018-05-16 NOTE — Progress Notes (Signed)
     I checked on patient around 9am and again just now. He passed a lot of blood last night during bowel prep. Hgb was 6.4 at 3am, getting more blood now. He has not passed any blood ongoing rectal effluent since last night. He is at high risk for procedures so at this point will cancel colonoscopy since bleeding seems to have resolved. Discussed this with patient and his wife and they are comfortable with the plan.

## 2018-05-17 LAB — BPAM RBC
BLOOD PRODUCT EXPIRATION DATE: 201907112359
BLOOD PRODUCT EXPIRATION DATE: 201907252359
BLOOD PRODUCT EXPIRATION DATE: 201907262359
BLOOD PRODUCT EXPIRATION DATE: 201907262359
BLOOD PRODUCT EXPIRATION DATE: 201907262359
Blood Product Expiration Date: 201907262359
Blood Product Expiration Date: 201907262359
ISSUE DATE / TIME: 201906301525
ISSUE DATE / TIME: 201906301802
ISSUE DATE / TIME: 201907011320
ISSUE DATE / TIME: 201907020453
ISSUE DATE / TIME: 201907020859
ISSUE DATE / TIME: 201906292230
ISSUE DATE / TIME: 201906300100
UNIT TYPE AND RH: 6200
UNIT TYPE AND RH: 6200
Unit Type and Rh: 6200
Unit Type and Rh: 6200
Unit Type and Rh: 6200
Unit Type and Rh: 6200
Unit Type and Rh: 6200

## 2018-05-17 LAB — TYPE AND SCREEN
ABO/RH(D): A POS
ANTIBODY SCREEN: NEGATIVE
UNIT DIVISION: 0
UNIT DIVISION: 0
UNIT DIVISION: 0
Unit division: 0
Unit division: 0
Unit division: 0
Unit division: 0

## 2018-05-17 LAB — HEMOGLOBIN AND HEMATOCRIT, BLOOD
HCT: 27.1 % — ABNORMAL LOW (ref 39.0–52.0)
Hemoglobin: 8.8 g/dL — ABNORMAL LOW (ref 13.0–17.0)

## 2018-05-17 MED ORDER — LISINOPRIL 10 MG PO TABS
10.0000 mg | ORAL_TABLET | Freq: Every day | ORAL | Status: DC
  Administered 2018-05-17 – 2018-05-18 (×2): 10 mg via ORAL
  Filled 2018-05-17 (×2): qty 1

## 2018-05-17 MED ORDER — ISOSORBIDE MONONITRATE ER 60 MG PO TB24
30.0000 mg | ORAL_TABLET | Freq: Every day | ORAL | Status: DC
  Administered 2018-05-17 – 2018-05-18 (×2): 30 mg via ORAL
  Filled 2018-05-17 (×2): qty 1

## 2018-05-17 MED ORDER — FUROSEMIDE 40 MG PO TABS
40.0000 mg | ORAL_TABLET | Freq: Every day | ORAL | Status: DC
  Administered 2018-05-17 – 2018-05-18 (×2): 40 mg via ORAL
  Filled 2018-05-17 (×2): qty 1

## 2018-05-17 NOTE — Progress Notes (Signed)
PROGRESS NOTE    [name]  OEU:[id number] DOB: [birth year] DOA: 05/13/2018 PCP: [name], MD   Brief Narrative:  Patient is a 72 year old male with past medical history of sarcoma of intra-abdominal site, COPD on 6 L of oxygen at home.  Diastolic CHF, history of coronary artery disease status post stents who presents to the emergency department with complaints of bloody stools.  Patient was noted to be anemic on presentation.  Diverticular bleed suspected.  GI consulted.  Assessment & Plan:   Active Problems:   Malignant neoplasm of bladder (HCC)   Coronary atherosclerosis of native coronary artery   COPD mixed type (HCC)   Macrocytic anemia   Chronic respiratory failure with hypoxia and hypercapnia (HCC)   OSA treated with BiPAP   Rectal bleeding   Chronic diastolic CHF (congestive heart failure) (HCC)   Lower GI bleed  GI bleed: Presented with a dark stools.  Diverticular bleed suspected.  Bleeding has stopped since yesterday.  He has been transfused with several units of PRBC till date.  Hemoglobin this morning stable at 9.2.Initial plan was to do a colonoscopy but held due to his severe COPD and anesthesia risks.  Will wait for GI recommendation.  Might be done as an outpatient also.  History of hypertension: Noted to be hypertensive this morning.  Will restart his home medications lisinopril and Imdur.  History of chronic diastolic CHF: Currently euvolemic.  Restarted on Lasix.  History of OSA: Continue BiPAP/CPAP.  History of intraabdominal spindle cell carcinoma:  Follows with oncology as an outpatient.  History of COPD: Chronic respiratory failure with hypoxia and hypercapnia.  Follows with [facility] as an outpatient.  Continue supplemental oxygen.  On 6 L of oxygen at baseline at home.  History of coronary artery disease: Status post stents.  Continue his home medications along with statin.  Hyperkalemia: Mild.  He started on lisinopril today due to  hypotension.  Will check potassium level tomorrow.   DVT prophylaxis:SCD Code Status: Full Family Communication: Wife present at the bedside Disposition Plan: Home after clearance by GI   Consultants: GI  Procedures: None  Antimicrobials: None  Subjective: Patient seen and examined the bedside this morning.  Remains comfortable.  Denies any abdominal pain.  No new episodes of dark stools.  Objective: Vitals:   05/17/18 0500 05/17/18 0600 05/17/18 0800 05/17/18 0813  BP: (!) 124/48 (!) 150/73    Pulse: 81 80  95  Resp: 19 19  (!) 22  Temp:   (!) 97.5 F (36.4 C)   TempSrc:   Axillary   SpO2: 100% 100%  99%  Weight:      Height:        Intake/Output Summary (Last 24 hours) at 05/17/2018 0901 Last data filed at 05/16/2018 1445 Gross per 24 hour  Intake 548 ml  Output -  Net 548 ml   Filed Weights   05/13/18 1535 05/13/18 2146  Weight: 104.3 kg (230 lb) 100.7 kg (222 lb 0.1 oz)    Examination:  General exam: Appears calm and comfortable ,Not in distress,obese HEENT:PERRL,Oral mucosa moist, Ear/Nose normal on gross exam Respiratory system: Bilateral decreased air entry cardiovascular system: S1 & S2 heard, RRR. No JVD, murmurs, rubs, gallops or clicks. No pedal edema. Gastrointestinal system: Abdomen is nondistended, soft and nontender. No organomegaly or masses felt. Normal bowel sounds heard. Central nervous system: Alert and oriented. No focal neurological deficits. Extremities: No edema, no clubbing ,no cyanosis, distal peripheral pulses palpable. Skin: No  rashes, lesions or ulcers,no icterus ,no pallor MSK: Normal muscle bulk,tone ,power Psychiatry: Judgement and insight appear normal. Mood & affect appropriate.     Data Reviewed: I have personally reviewed following labs and imaging studies  CBC: Recent Labs  Lab 05/14/18 1406 05/14/18 2243 05/15/18 0449 05/15/18 1041 05/15/18 2027 05/16/18 0307 05/16/18 1658  WBC 11.8* 11.0* 10.2 10.5  --  7.7  --    HGB 6.8* 8.0* 7.2* 7.1* 8.6* 6.4* 9.2*  HCT 20.7* 24.4* 21.7* 21.1* 26.3* 19.5* 27.4*  MCV 90.8 91.4 90.8 91.3  --  90.7  --   PLT 196 149* 132* 148*  --  116*  --    Basic Metabolic Panel: Recent Labs  Lab 05/13/18 1639 05/14/18 0542 05/16/18 0307  NA 138 136 135  K 4.4 5.2* 5.0  CL 103 105 104  CO2 27 25 25   GLUCOSE 156* 153* 135*  BUN 20 21 15   CREATININE 0.91 0.99 0.83  CALCIUM 8.5* 7.8* 7.7*  MG  --  1.7  --   PHOS  --  3.6  --    GFR: Estimated Creatinine Clearance: 88.1 mL/min (by C-G formula based on SCr of 0.83 mg/dL). Liver Function Tests: Recent Labs  Lab 05/13/18 1639 05/14/18 0542  AST 37 31  ALT 38 29  ALKPHOS 43 32*  BILITOT 1.4* 1.5*  PROT 6.7 5.3*  ALBUMIN 3.5 2.9*   No results for input(s): LIPASE, AMYLASE in the last 168 hours. No results for input(s): AMMONIA in the last 168 hours. Coagulation Profile: No results for input(s): INR, PROTIME in the last 168 hours. Cardiac Enzymes: Recent Labs  Lab 05/13/18 2043 05/14/18 0542 05/14/18 1226  TROPONINI <0.03 <0.03 <0.03   BNP (last 3 results) No results for input(s): PROBNP in the last 8760 hours. HbA1C: No results for input(s): HGBA1C in the last 72 hours. CBG: No results for input(s): GLUCAP in the last 168 hours. Lipid Profile: No results for input(s): CHOL, HDL, LDLCALC, TRIG, CHOLHDL, LDLDIRECT in the last 72 hours. Thyroid Function Tests: Recent Labs    05/14/18 1226  TSH 4.399   Anemia Panel: No results for input(s): VITAMINB12, FOLATE, FERRITIN, TIBC, IRON, RETICCTPCT in the last 72 hours. Sepsis Labs: No results for input(s): PROCALCITON, LATICACIDVEN in the last 168 hours.  Recent Results (from the past 240 hour(s))  MRSA PCR Screening     Status: None   Collection Time: 05/13/18 10:15 PM  Result Value Ref Range Status   MRSA by PCR NEGATIVE NEGATIVE Final    Comment:        The GeneXpert MRSA Assay (FDA approved for NASAL specimens only), is one component of  a comprehensive MRSA colonization surveillance program. It is not intended to diagnose MRSA infection nor to guide or monitor treatment for MRSA infections. Performed at [facility], [place] [address], [place], [place] [address]          Radiology Studies: No results found.      Scheduled Meds: . sodium chloride   Intravenous Once  . sodium chloride   Intravenous Once  . sodium chloride   Intravenous Once  . budesonide  0.25 mg Nebulization BID  . feeding supplement  1 Container Oral TID BM  . furosemide  40 mg Oral Daily  . guaiFENesin  1,200 mg Oral BID  . ipratropium-albuterol  3 mL Nebulization QID  . isosorbide mononitrate  30 mg Oral Daily  . lisinopril  10 mg Oral Daily  . simvastatin  20 mg Oral QHS   Continuous Infusions:   LOS: 4 days    Time spent: 35 mins.More than 50% of that time was spent in counseling and/or coordination of care.      Shelly Coss, MD [facility] Pager [phone]  If 7PM-7AM, please contact night-coverage [email or web address] Password [email or web address] 05/17/2018, 9:01 AM

## 2018-05-17 NOTE — Progress Notes (Addendum)
Patient ID: [name], male   DOB: 1946-05-21, 72 y.o.   MRN: [id number]    Progress Note   Subjective  Sitting in chair- was able to walk halls earlier No bleeding in over 24 hrs Last hgb 9.2 last pm after transfusions   Objective   Vital signs in last 24 hours: Temp:  [97.2 F (36.2 C)-98.6 F (37 C)] 97.5 F (36.4 C) (07/03 0800) Pulse Rate:  [74-154] 95 (07/03 0813) Resp:  [16-24] 22 (07/03 0813) BP: (115-157)/(48-76) 150/73 (07/03 0600) SpO2:  [91 %-100 %] 99 % (07/03 0813) Last BM Date: 05/17/18 General:    White male in NAD Heart:  Regular rate and rhythm; no murmurs Lungs: Respirations even and unlabored, lungs CTA bilaterally, decreased bilat Abdomen:  Soft, nontender and nondistended. Normal bowel sounds. Extremities:  Without edema. Neurologic:  Alert and oriented,  grossly normal neurologically. Psych:  Cooperative. Normal mood and affect.   Lab Results: Recent Labs    05/15/18 0449 05/15/18 1041 05/15/18 2027 05/16/18 0307 05/16/18 1658  WBC 10.2 10.5  --  7.7  --   HGB 7.2* 7.1* 8.6* 6.4* 9.2*  HCT 21.7* 21.1* 26.3* 19.5* 27.4*  PLT 132* 148*  --  116*  --    BMET Recent Labs    05/16/18 0307  NA 135  K 5.0  CL 104  CO2 25  GLUCOSE 135*  BUN 15  CREATININE 0.83  CALCIUM 7.7*   LFT No results for input(s): PROT, ALBUMIN, AST, ALT, ALKPHOS, BILITOT, BILIDIR, IBILI in the last 72 hours. PT/INR No results for input(s): LABPROT, INR in the last 72 hours.  Studies/Results: No results found.     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  Just had a non-bloody BM (3pm).  Hb has been stable. I agree he is safe for d/c tomorrow unless bleeding recurs. Very high risk for sedation given his severe COPD and so no plans for colonoscopy at this point.    Owens Loffler, MD [facility] Gastroenterology Pager [phone]

## 2018-05-17 NOTE — Evaluation (Signed)
Physical Therapy Evaluation Patient Details Name: [name] MRN: [id number] DOB: 1946/06/03 Today's Date: 05/17/2018   History of Present Illness  Pt admitted with rectal bleed and hx of COPD, CAD, and bladder CA  Clinical Impression  Pt admitted as above and presenting with functional mobility limitations 2* generalized weakness, limited endurance and mild ambulatory balance deficits.  Pt should progress to dc home with assist of family.    Follow Up Recommendations Home health PT    Equipment Recommendations  None recommended by PT    Recommendations for Other Services       Precautions / Restrictions Precautions Precautions: Fall Restrictions Weight Bearing Restrictions: No      Mobility  Bed Mobility               General bed mobility comments: Pt up in chair and requests back to same  Transfers Overall transfer level: Needs assistance Equipment used: Rolling walker (2 wheeled) Transfers: Sit to/from Stand Sit to Stand: Min guard         General transfer comment: steady assist  Ambulation/Gait Ambulation/Gait assistance: Min guard Gait Distance (Feet): 180 Feet Assistive device: Rolling walker (2 wheeled) Gait Pattern/deviations: Step-through pattern;Decreased step length - right;Decreased step length - left;Shuffle;Trunk flexed Gait velocity: decr   General Gait Details: min cues for posture and position from RW; increased time with multiple standing rest breaks.  HR max at 103 with SaO2 maintaine at 95%+ on ^L  Stairs            Wheelchair Mobility    Modified Rankin (Stroke Patients Only)       Balance Overall balance assessment: Mild deficits observed, not formally tested                                           Pertinent Vitals/Pain Pain Assessment: No/denies pain    Home Living Family/patient expects to be discharged to:: Private residence Living Arrangements: Spouse/significant other Available Help at  Discharge: Family Type of Home: House Home Access: Stairs to enter Entrance Stairs-Rails: Right;Can reach Software engineer of Steps: 3 Home Layout: One level Home Equipment: Environmental consultant - 2 wheels      Prior Function Level of Independence: Independent         Comments: pt states ambulating sans AD     Hand Dominance   Dominant Hand: Right    Extremity/Trunk Assessment   Upper Extremity Assessment Upper Extremity Assessment: Generalized weakness    Lower Extremity Assessment Lower Extremity Assessment: Generalized weakness       Communication   Communication: No difficulties  Cognition Arousal/Alertness: Awake/alert Behavior During Therapy: WFL for tasks assessed/performed Overall Cognitive Status: Within Functional Limits for tasks assessed                                        General Comments      Exercises     Assessment/Plan    PT Assessment Patient needs continued PT services  PT Problem List Decreased strength;Decreased activity tolerance;Decreased balance;Decreased mobility;Decreased knowledge of use of DME;Obesity       PT Treatment Interventions DME instruction;Gait training;Stair training;Functional mobility training;Therapeutic activities;Therapeutic exercise;Balance training;Patient/family education    PT Goals (Current goals can be found in the Care Plan section)  Acute Rehab PT Goals Patient  Stated Goal: Regain IND PT Goal Formulation: With patient Time For Goal Achievement: 05/31/18 Potential to Achieve Goals: Good    Frequency Min 3X/week   Barriers to discharge        Co-evaluation               AM-PAC PT "6 Clicks" Daily Activity  Outcome Measure Difficulty turning over in bed (including adjusting bedclothes, sheets and blankets)?: A Lot Difficulty moving from lying on back to sitting on the side of the bed? : A Lot Difficulty sitting down on and standing up from a chair with arms (e.g.,  wheelchair, bedside commode, etc,.)?: A Little Help needed moving to and from a bed to chair (including a wheelchair)?: A Little Help needed walking in hospital room?: A Little Help needed climbing 3-5 steps with a railing? : A Little 6 Click Score: 16    End of Session Equipment Utilized During Treatment: Gait belt;Oxygen Activity Tolerance: Patient tolerated treatment well Patient left: in chair;with call bell/phone within reach;with family/visitor present Nurse Communication: Mobility status PT Visit Diagnosis: Muscle weakness (generalized) (M62.81)    Time: 3244-0102 PT Time Calculation (min) (ACUTE ONLY): 23 min   Charges:   PT Evaluation $PT Eval Low Complexity: 1 Low PT Treatments $Gait Training: 8-22 mins   PT G Codes:        Pg [phone]   Darriel Utter 05/17/2018, 12:53 PM

## 2018-05-18 DIAGNOSIS — J9611 Chronic respiratory failure with hypoxia: Secondary | ICD-10-CM

## 2018-05-18 DIAGNOSIS — I5032 Chronic diastolic (congestive) heart failure: Secondary | ICD-10-CM

## 2018-05-18 DIAGNOSIS — J9612 Chronic respiratory failure with hypercapnia: Secondary | ICD-10-CM

## 2018-05-18 LAB — CBC WITH DIFFERENTIAL/PLATELET
BASOS PCT: 0 %
Basophils Absolute: 0 10*3/uL (ref 0.0–0.1)
Basophils Absolute: 0 10*3/uL (ref 0.0–0.1)
Basophils Relative: 0 %
EOS ABS: 0.2 10*3/uL (ref 0.0–0.7)
EOS ABS: 0.2 10*3/uL (ref 0.0–0.7)
EOS PCT: 3 %
EOS PCT: 3 %
HCT: 23.6 % — ABNORMAL LOW (ref 39.0–52.0)
HCT: 31.9 % — ABNORMAL LOW (ref 39.0–52.0)
Hemoglobin: 7.9 g/dL — ABNORMAL LOW (ref 13.0–17.0)
Hemoglobin: 10.8 g/dL — ABNORMAL LOW (ref 13.0–17.0)
LYMPHS ABS: 0.7 10*3/uL (ref 0.7–4.0)
LYMPHS PCT: 14 %
Lymphocytes Relative: 14 %
Lymphs Abs: 0.8 10*3/uL (ref 0.7–4.0)
MCH: 31.5 pg (ref 26.0–34.0)
MCH: 31.9 pg (ref 26.0–34.0)
MCHC: 33.5 g/dL (ref 30.0–36.0)
MCHC: 33.9 g/dL (ref 30.0–36.0)
MCV: 94 fL (ref 78.0–100.0)
MCV: 94.1 fL (ref 78.0–100.0)
Monocytes Absolute: 0.5 10*3/uL (ref 0.1–1.0)
Monocytes Absolute: 0.6 10*3/uL (ref 0.1–1.0)
Monocytes Relative: 11 %
Monocytes Relative: 10 %
Neutro Abs: 3.6 10*3/uL (ref 1.7–7.7)
Neutro Abs: 4.2 10*3/uL (ref 1.7–7.7)
Neutrophils Relative %: 72 %
Neutrophils Relative %: 73 %
PLATELETS: 107 10*3/uL — AB (ref 150–400)
PLATELETS: 104 10*3/uL — AB (ref 150–400)
RBC: 2.51 MIL/uL — ABNORMAL LOW (ref 4.22–5.81)
RBC: 3.39 MIL/uL — AB (ref 4.22–5.81)
RDW: 16.9 % — AB (ref 11.5–15.5)
RDW: 15.9 % — AB (ref 11.5–15.5)
WBC: 4.9 10*3/uL (ref 4.0–10.5)
WBC: 5.8 10*3/uL (ref 4.0–10.5)

## 2018-05-18 LAB — BASIC METABOLIC PANEL
Anion gap: 5 (ref 5–15)
BUN: 13 mg/dL (ref 8–23)
CALCIUM: 7.7 mg/dL — AB (ref 8.9–10.3)
CO2: 27 mmol/L (ref 22–32)
CREATININE: 0.8 mg/dL (ref 0.61–1.24)
Chloride: 101 mmol/L (ref 98–111)
GFR calc Af Amer: >60 mL/min (ref >60)
GFR calc non Af Amer: >60 mL/min (ref >60)
Glucose, Bld: 129 mg/dL — ABNORMAL HIGH (ref 70–99)
Potassium: 4.6 mmol/L (ref 3.5–5.1)
SODIUM: 133 mmol/L — AB (ref 135–145)

## 2018-05-18 LAB — PREPARE RBC (CROSSMATCH)

## 2018-05-18 MED ORDER — ORAL CARE MOUTH RINSE
15.0000 mL | Freq: Two times a day (BID) | OROMUCOSAL | Status: DC
  Administered 2018-05-18: 15 mL via OROMUCOSAL

## 2018-05-18 MED ORDER — PANTOPRAZOLE SODIUM 40 MG PO TBEC: 40.0000 mg | DELAYED_RELEASE_TABLET | Freq: Every day | ORAL | 0 refills | Status: DC

## 2018-05-18 MED ORDER — FUROSEMIDE 10 MG/ML IJ SOLN
20.0000 mg | Freq: Once | INTRAMUSCULAR | Status: AC
  Administered 2018-05-18: 20 mg via INTRAVENOUS
  Filled 2018-05-18: qty 2

## 2018-05-18 MED ORDER — SODIUM CHLORIDE 0.9% IV SOLUTION
Freq: Once | INTRAVENOUS | Status: AC
  Administered 2018-05-18: 10:00:00 via INTRAVENOUS

## 2018-05-18 NOTE — Discharge Summary (Addendum)
Physician Discharge Summary  [name]:[id number] DOB: Oct 09, 1946 DOA: 05/13/2018  PCP: [name], MD  Admit date: 05/13/2018 Discharge date: 05/18/2018  Admitted From: Home Disposition:  Home  Discharge Condition:Stable CODE STATUS:FULL Diet recommendation: Heart Healthy    Brief/Interim Summary: Patient is a 72 year old male with past medical history of sarcoma of intra-abdominal site, COPD on 6 L of oxygen at home.  Diastolic CHF, history of coronary artery disease status post stents who presents to the emergency department with complaints of bloody stools.  Patient was noted to be anemic on presentation.  Diverticular bleed suspected.  GI consulted.  Currently he does not have any new episodes of bloody bowel movement.  He was transfused with PRBCs during this admission.  Since he has severe COPD and high risks for anesthesia complication, colonoscopy was not planned and might be considered as an outpatient.  Currently he is hemodynamically stable.  He is stable for discharge to home today.  He will follow-up with GI as an outpatient.  Following problems were addressed during his hospitalization:   GI bleed: Presented with a dark stools.  Diverticular bleed suspected.  Bleeding has stopped since last 2 days.  He has been transfused with several units of PRBC till date.  Hemoglobin this morning is 7.9. Initial plan was to do a colonoscopy but held due to his severe COPD and anesthesia risks.   Might be done as an outpatient .  Will recommend to discontinue aspirin and NSAIDs for now.  Start on Protonix.  History of hypertension:BP stable  this morning.  Resumed his home medications lisinopril and Imdur.  History of chronic diastolic CHF: Currently euvolemic.  Restarted on Lasix.  History of OSA: Continue BiPAP/CPAP.  History of intraabdominal spindle cell carcinoma:  Follows with oncology as an outpatient.  History of COPD: Chronic respiratory failure with hypoxia  and hypercapnia. Follows with [facility] as an outpatient.  Continue supplemental oxygen.  On 6 L of oxygen at baseline at home.  History of coronary artery disease: Status post stents.  Continue his home medications along with statin.  Hyperkalemia: Normalized.   Discharge Diagnoses:  Active Problems:   Malignant neoplasm of bladder (HCC)   Coronary atherosclerosis of native coronary artery   COPD mixed type (HCC)   Macrocytic anemia   Chronic respiratory failure with hypoxia and hypercapnia (HCC)   OSA treated with BiPAP   Rectal bleeding   Chronic diastolic CHF (congestive heart failure) (HCC)   Lower GI bleed    Discharge Instructions  Discharge Instructions    Diet - low sodium heart healthy   Complete by:  As directed    Discharge instructions   Complete by:  As directed    1) Please follow-up with your PCP in a week.  Do a CBC and BMP test during the follow-up. 2) Follow up with gastroenterology as an outpatient in 2 weeks.  Name and number of the provider has been attached. 3) Take prescribed medications as instructed.   Increase activity slowly   Complete by:  As directed      Allergies as of 05/18/2018      Reactions   Amlodipine Swelling      Medication List    STOP taking these medications   ibuprofen 600 MG tablet Commonly known as:  ADVIL,MOTRIN   potassium chloride SA 20 MEQ tablet Commonly known as:  K-DUR,KLOR-CON     TAKE these medications   ALPRAZolam 0.5 MG tablet Commonly known as:  Duanne Moron  Take 0.5 mg by mouth at bedtime.   aspirin 81 MG tablet Take 81 mg by mouth at bedtime.   budesonide 0.25 MG/2ML nebulizer solution Commonly known as:  PULMICORT Take 2 mLs (0.25 mg total) by nebulization 2 (two) times daily.   cholecalciferol 400 units Tabs tablet Commonly known as:  VITAMIN D Take 400 Units by mouth at bedtime.   CINNAMON PO Take 1,000 mg by mouth every evening.   Fish Oil 1000 MG Cpdr Take 1,000 mg by mouth daily.    FLAX SEED OIL PO Take 1,400 mg by mouth daily.   furosemide 40 MG tablet Commonly known as:  LASIX Take 40 mg by mouth daily.   Garlic 3220 MG Caps Take 1,000 mg by mouth every evening.   ipratropium-albuterol 0.5-2.5 (3) MG/3ML Soln Commonly known as:  DUONEB Take 3 mLs by nebulization 4 (four) times daily.   isosorbide mononitrate 30 MG 24 hr tablet Commonly known as:  IMDUR Take 1 tablet (30 mg total) by mouth daily.   lisinopril 10 MG tablet Commonly known as:  PRINIVIL,ZESTRIL Take 10 mg by mouth daily.   methocarbamol 500 MG tablet Commonly known as:  ROBAXIN TAKE 1 TO 2 TABLETS BY MOUTH TWICE A DAY AS NEEDED FOR MUSCLE CRAMPS   multivitamin-lutein Caps capsule Take 1 capsule by mouth 2 (two) times daily.   nitroGLYCERIN 0.4 MG SL tablet Commonly known as:  NITROSTAT Place 1 tablet (0.4 mg total) under the tongue every 5 (five) minutes as needed for chest pain.   pantoprazole 40 MG tablet Commonly known as:  PROTONIX Take 1 tablet (40 mg total) by mouth daily.   Potassium 99 MG Tabs Take 1 tablet by mouth 2 (two) times daily.   PROAIR RESPICLICK 254 (90 Base) MCG/ACT Aepb Generic drug:  Albuterol Sulfate Inhale 2 puffs into the lungs every 6 (six) hours as needed (shortness of breath).   simvastatin 20 MG tablet Commonly known as:  ZOCOR Take 1 tablet (20 mg total) by mouth daily at 6 PM.   sodium chloride 0.65 % Soln nasal spray Commonly known as:  OCEAN Place 1 spray into both nostrils as needed for congestion.   SYSTANE OP Apply 1 drop to eye daily as needed (dry eyes).   vitamin B-12 1000 MCG tablet Commonly known as:  CYANOCOBALAMIN Take 1,000 mcg by mouth daily.   vitamin C 1000 MG tablet Take 1,000 mg by mouth every evening.      Follow-up Information    [name], MD. Schedule an appointment as soon as possible for a visit in 1 week(s).   Specialty:  Internal Medicine Contact information: [place]  [address] [phone]        Milus Banister, MD. Schedule an appointment as soon as possible for a visit in 2 week(s).   Specialty:  Gastroenterology Contact information: [address] [place] [address] [phone]          Allergies  Allergen Reactions  . Amlodipine Swelling    Consultations: GI  Procedures/Studies: Ct Abdomen Pelvis W Contrast  Result Date: 05/13/2018 CLINICAL DATA:  Bright red rectal bleeding this morning for 30 minutes. History of bladder neoplasm. EXAM: CT ABDOMEN AND PELVIS WITH CONTRAST TECHNIQUE: Multidetector CT imaging of the abdomen and pelvis was performed using the standard protocol following bolus administration of intravenous contrast. CONTRAST:  135mL ISOVUE-300 IOPAMIDOL (ISOVUE-300) INJECTION 61% COMPARISON:  02/18/2017 and 07/02/2010 FINDINGS: Lower chest: Heart size is normal without pericardial effusion  or thickening. Moderate centrilobular and paraseptal emphysema at the lung bases. There is coronary arteriosclerosis visualized along the included RCA. Hepatobiliary: Steatosis of the liver. Stable 4.7 x 3.5 x 3.6 cm hypodense lesion of the left hepatic lobe unchanged from prior 02/18/2017 study but new since 2011. Repeat delayed imaging demonstrates no significant enhancement. No biliary dilatation is identified. The gallbladder contains tiny layering calculi near the neck. No secondary signs of acute cholecystitis. Pancreas: Pancreas is slightly fatty in appearance without ductal dilatation or inflammation. Spleen: Normal size spleen without mass. Adrenals/Urinary Tract: Indeterminate hypodense nodule of the posterior limb of the left adrenal gland measuring 15 x 12 mm, not apparent on prior studies and may be new. Hypodense simple cyst of the mid to upper right kidney measuring 6.8 cm in diameter is stable. No enhancing mass lesions of the kidneys. No nephrolithiasis nor hydroureteronephrosis. The urinary bladder is decompressed without  focal mural thickening or enhancement. Stomach/Bowel: Nondistended stomach. Redemonstration of heterogeneously enhancing partially necrotic appearing mass centered within the right abdominal mesentery measuring approximately 19.6 x 17.6 x 16.1 cm in transverse by craniocaudad by AP dimension, relatively stable having previously measured 25.3 x 16.5 x 17.8 cm in 2018. No bowel obstruction or inflammation is noted. Descending and sigmoid diverticulosis is identified without acute diverticulitis. Vascular/Lymphatic: Atherosclerotic nonaneurysmal abdominal aorta with patent portal, splenic, hepatic and renal veins. No pathologically enlarged lymph nodes. Reproductive: Top-normal size prostate. Other: No pneumoperitoneum.  No ascites. Musculoskeletal: Thoracolumbar spondylosis without aggressive osseous lesions. IMPRESSION: 1. Descending and sigmoid diverticulosis without acute diverticulitis. The diverticulosis may be contributing to the lesions of the rectum. No definite annular constricting lesions are visualized. 2. Stable heterogeneous partially necrotic appearing mass centered within the mesentery of the right hemiabdomen currently estimated at the 19.6 x 17.6 x 16 1 cm. 3. Nonenhancing hypodense lesions possible a complex cyst or hemangioma, stable since 2018 currently estimated at 4.7 x 3.5 x 3.6 cm. Further correlation could performed with MRI to better evaluate as this is not apparent in 2011 and to exclude the possibility of a metastatic focus. Electronically Signed   By: Ashley Royalty M.D.   On: 05/13/2018 18:16   Ct Angio Abd/pel W/ And/or W/o  Result Date: 05/14/2018 CLINICAL DATA:  GI bleeding with bloody stool EXAM: CTA ABDOMEN AND PELVIS wITHOUT AND WITH CONTRAST TECHNIQUE: Multidetector CT imaging of the abdomen and pelvis was performed using the standard protocol during bolus administration of intravenous contrast. Multiplanar reconstructed images and MIPs were obtained and reviewed to evaluate the  vascular anatomy. CONTRAST:  125mL ISOVUE-370 IOPAMIDOL (ISOVUE-370) INJECTION 76% COMPARISON:  CT from yesterday FINDINGS: VASCULAR Aorta: Atheromatous plaque. Undulating contour without aneurysm or dissection. Celiac: Atheromatous plaque at the origin with mild-to-moderate narrowing. No branch occlusion or aneurysm SMA: Atherosclerosis but widely patent. Renals: Bilateral accessory renal arteries. Atherosclerotic plaque narrows bilateral renal arteries without asymmetric enhancement IMA: Small but patent vessel Inflow: Diffuse atherosclerotic calcification. Advanced narrowing at the origin of the left common iliac due to a calcified plaque Proximal Outflow: Extensive atherosclerotic plaque on the bilateral common femoral and superficial femoral arteries with at least moderate narrowing. Veins: Patent Review of the MIP images confirms the above findings. NON-VASCULAR Lower chest: Coronary atherosclerosis.  Emphysema. Hepatobiliary: Low-density subcapsular mass in the left liver measuring up to 55 mm, suspected metastasis by outside imaging reports that [facility].Cholelithiasis. Pancreas: Unremarkable. Spleen: Unremarkable. Adrenals/Urinary Tract: 11 mm left adrenal nodule, known from outside imaging reports. Simple right renal cyst. No hydronephrosis or stone.  Unremarkable bladder. Stomach/Bowel: Extensive sigmoid diverticulosis. Milder diverticulosis seen elsewhere. No visible intraluminal active hemorrhage; high-density contents at the rectum does not change between the phases and is not associated with visible abnormal vessels. 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 Lymphatic: No enlarged lymph nodes Reproductive:Negative Other: No ascites or pneumoperitoneum. Musculoskeletal: Degenerative changes without acute or aggressive finding. IMPRESSION: 1. No specific explanation for GI bleeding. No evidence of  active hemorrhage. 2. Extensive distal colonic diverticulosis. 3. 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 No visible bowel fistulization to explain the presentation. 4. Left hepatic and left adrenal nodules, suspected metastases based on outside imaging reports. 5. Cholelithiasis. Electronically Signed   By: Monte Fantasia M.D.   On: 05/14/2018 11:27      Subjective:   Discharge Exam: Vitals:   05/18/18 0824 05/18/18 0828  BP:  131/68  Pulse:  84  Resp:  18  Temp:    SpO2: 99% 100%   Vitals:   05/18/18 0600 05/18/18 0800 05/18/18 0824 05/18/18 0828  BP: (!) 125/57   131/68  Pulse: 79 87  84  Resp: 19 (!) 23  18  Temp:  98.2 F (36.8 C)    TempSrc:  Oral    SpO2: 100% 98% 99% 100%  Weight:      Height:        General: Pt is alert, awake, not in acute distress Cardiovascular: RRR, S1/S2 +, no rubs, no gallops Respiratory: Decreased air entry bilaterally Abdominal: Soft, NT, ND, bowel sounds + Extremities: no edema, no cyanosis    The results of significant diagnostics from this hospitalization (including imaging, microbiology, ancillary and laboratory) are listed below for reference.     Microbiology: Recent Results (from the past 240 hour(s))  MRSA PCR Screening     Status: None   Collection Time: 05/13/18 10:15 PM  Result Value Ref Range Status   MRSA by PCR NEGATIVE NEGATIVE Final    Comment:        The GeneXpert MRSA Assay (FDA approved for NASAL specimens only), is one component of a comprehensive MRSA colonization surveillance program. It is not intended to diagnose MRSA infection nor to guide or monitor treatment for MRSA infections. Performed at [facility], [place] [address] [place], [place] [address]      Labs: BNP (last 3 results) No results for input(s): BNP in the last 8760 hours. Basic Metabolic Panel: Recent Labs  Lab 05/13/18 1639 05/14/18 0542 05/16/18 0307 05/18/18 0318  NA 138 136 135 133*  K  4.4 5.2* 5.0 4.6  CL 103 105 104 101  CO2 27 25 25 27   GLUCOSE 156* 153* 135* 129*  BUN 20 21 15 13   CREATININE 0.91 0.99 0.83 0.80  CALCIUM 8.5* 7.8* 7.7* 7.7*  MG  --  1.7  --   --   PHOS  --  3.6  --   --    Liver Function Tests: Recent Labs  Lab 05/13/18 1639 05/14/18 0542  AST 37 31  ALT 38 29  ALKPHOS 43 32*  BILITOT 1.4* 1.5*  PROT 6.7 5.3*  ALBUMIN 3.5 2.9*   No results for input(s): LIPASE, AMYLASE in the last 168 hours. No results for input(s): AMMONIA in the last 168 hours. CBC: Recent Labs  Lab 05/14/18 2243 05/15/18 0449 05/15/18 1041 05/15/18 2027 05/16/18 0307 05/16/18 1658 05/17/18 1212 05/18/18 0318  WBC  11.0* 10.2 10.5  --  7.7  --   --  4.9  NEUTROABS  --   --   --   --   --   --   --  3.6  HGB 8.0* 7.2* 7.1* 8.6* 6.4* 9.2* 8.8* 7.9*  HCT 24.4* 21.7* 21.1* 26.3* 19.5* 27.4* 27.1* 23.6*  MCV 91.4 90.8 91.3  --  90.7  --   --  94.0  PLT 149* 132* 148*  --  116*  --   --  107*   Cardiac Enzymes: Recent Labs  Lab 05/13/18 2043 05/14/18 0542 05/14/18 1226  TROPONINI <0.03 <0.03 <0.03   BNP: Invalid input(s): POCBNP CBG: No results for input(s): GLUCAP in the last 168 hours. D-Dimer No results for input(s): DDIMER in the last 72 hours. Hgb A1c No results for input(s): HGBA1C in the last 72 hours. Lipid Profile No results for input(s): CHOL, HDL, LDLCALC, TRIG, CHOLHDL, LDLDIRECT in the last 72 hours. Thyroid function studies No results for input(s): TSH, T4TOTAL, T3FREE, THYROIDAB in the last 72 hours.  Invalid input(s): FREET3 Anemia work up No results for input(s): VITAMINB12, FOLATE, FERRITIN, TIBC, IRON, RETICCTPCT in the last 72 hours. Urinalysis    Component Value Date/Time   COLORURINE YELLOW 01/19/2017 1739   APPEARANCEUR CLEAR 01/19/2017 1739   LABSPEC 1.023 01/19/2017 1739   PHURINE 5.0 01/19/2017 1739   GLUCOSEU NEGATIVE 01/19/2017 1739   GLUCOSEU NEGATIVE 06/30/2010 1050   HGBUR NEGATIVE 01/19/2017 1739   BILIRUBINUR  NEGATIVE 01/19/2017 1739   KETONESUR 5 (A) 01/19/2017 1739   PROTEINUR 30 (A) 01/19/2017 1739   UROBILINOGEN 0.2 06/30/2010 1050   NITRITE NEGATIVE 01/19/2017 1739   LEUKOCYTESUR NEGATIVE 01/19/2017 1739   Sepsis Labs Invalid input(s): PROCALCITONIN,  WBC,  LACTICIDVEN Microbiology Recent Results (from the past 240 hour(s))  MRSA PCR Screening     Status: None   Collection Time: 05/13/18 10:15 PM  Result Value Ref Range Status   MRSA by PCR NEGATIVE NEGATIVE Final    Comment:        The GeneXpert MRSA Assay (FDA approved for NASAL specimens only), is one component of a comprehensive MRSA colonization surveillance program. It is not intended to diagnose MRSA infection nor to guide or monitor treatment for MRSA infections. Performed at [facility], [place] [address] [place], [place] [address]     Please note: You were cared for by a hospitalist during your hospital stay. Once you are discharged, your primary care physician will handle any further medical issues. Please note that NO REFILLS for any discharge medications will be authorized once you are discharged, as it is imperative that you return to your primary care physician (or establish a relationship with a primary care physician if you do not have one) for your post hospital discharge needs so that they can reassess your need for medications and monitor your lab values.    Time coordinating discharge: 40 minutes  SIGNED:   Shelly Coss, MD  [facility] 05/18/2018, 10:08 AM Pager [phone]  If 7PM-7AM, please contact night-coverage [email or web address] Password [email or web address]

## 2018-05-18 NOTE — Progress Notes (Signed)
PT Cancellation Note  Patient Details Name: [name] MRN: [id number] DOB: [birth year]   Cancelled Treatment:    Reason Eval/Treat Not Completed: attempted pt tx session-pt declined to participate. he is scheduled to get blood then d/c later today.    Weston Anna, MPT Pager: [phone]

## 2018-05-18 NOTE — Progress Notes (Addendum)
Patient ID: [name], male   DOB: 1946-09-02, 72 y.o.   MRN: [id number]    Progress Note   Subjective   Had BM this am - no blood  HGB drifted  To 7.9 this am  No c/o abd pain, dyspnea  About baseline    Objective   Vital signs in last 24 hours: Temp:  [97.5 F (36.4 C)-99.1 F (37.3 C)] 98.2 F (36.8 C) (07/04 0800) Pulse Rate:  [79-100] 84 (07/04 0828) Resp:  [15-29] 18 (07/04 0828) BP: (90-145)/(45-73) 131/68 (07/04 0828) SpO2:  [95 %-100 %] 100 % (07/04 0828) Last BM Date: 05/17/18 General:    Older WM in NAD SOB on o2 Heart:  Regular rate and rhythm; no murmurs Lungs: Respirations even and unlabored, lungs CTA bilaterally Abdomen:  Soft, nontender and nondistended. Normal bowel sounds. Extremities:  Without edema. Neurologic:  Alert and oriented,  grossly normal neurologically. Psych:  Cooperative. Normal mood and affect.  Intake/Output from previous day: No intake/output data recorded. Intake/Output this shift: Total I/O In: 480 [P.O.:480] Out: 350 [Urine:350]  Lab Results: Recent Labs    05/15/18 1041  05/16/18 0307 05/16/18 1658 05/17/18 1212 05/18/18 0318  WBC 10.5  --  7.7  --   --  4.9  HGB 7.1*   < > 6.4* 9.2* 8.8* 7.9*  HCT 21.1*   < > 19.5* 27.4* 27.1* 23.6*  PLT 148*  --  116*  --   --  107*   < > = values in this interval not displayed.   BMET Recent Labs    05/16/18 0307 05/18/18 0318  NA 135 133*  K 5.0 4.6  CL 104 101  CO2 25 27  GLUCOSE 135* 129*  BUN 15 13  CREATININE 0.83 0.80  CALCIUM 7.7* 7.7*   LFT No results for input(s): PROT, ALBUMIN, AST, ALT, ALKPHOS, BILITOT, BILIDIR, IBILI in the last 72 hours. PT/INR No results for input(s): LABPROT, INR in the last 72 hours.  Studies/Results: No results found.     Assessment / Plan:     #1 72 yo male with acute lower GI bleed - resolved - Diverticular vs secondary to  Known large Sarcoma with communication to [facility] (not seen on CT angio ) #2 anemia - secondary to  blood loss #3 severe COPD/on 6 liters chronically #4 CAD #5 CHF  Bleed has stopped  HGB drifted - pt will benefit from 2 units Prbc's  prior to discharge (will give lasix between units)- then ok to go home later today  May resume baby ASA   Pt and wife aware to return to hospital for any recurrence of bleeding  Pt should follow up with Dr Ardeth Perfect  His PCP in one week for follow up labs etc Gi available as needed.      Contact  Amy [place], P.A.-C               [phone]    ________________________________________________________________________  Velora Heckler GI MD note:  I personally examined the patient, reviewed the data and agree with the assessment and plan described above.  Hopefully his bleeding will not recur.  He is at very high risk for any treatment options given his severe COPD.     Owens Loffler, MD [facility]astroenterology Pager [phone]

## 2018-05-19 LAB — TYPE AND SCREEN
ABO/RH(D): A POS
ANTIBODY SCREEN: NEGATIVE
UNIT DIVISION: 0
Unit division: 0

## 2018-05-19 LAB — BPAM RBC
Blood Product Expiration Date: 201907262359
Blood Product Expiration Date: 201907262359
ISSUE DATE / TIME: 201907041159
ISSUE DATE / TIME: 201907041411
Unit Type and Rh: 6200
Unit Type and Rh: 6200

## 2018-05-23 DIAGNOSIS — K922 Gastrointestinal hemorrhage, unspecified: Secondary | ICD-10-CM | POA: Diagnosis not present

## 2018-05-23 DIAGNOSIS — I10 Essential (primary) hypertension: Secondary | ICD-10-CM | POA: Diagnosis not present

## 2018-05-23 DIAGNOSIS — Z9981 Dependence on supplemental oxygen: Secondary | ICD-10-CM | POA: Diagnosis not present

## 2018-05-23 DIAGNOSIS — R252 Cramp and spasm: Secondary | ICD-10-CM | POA: Diagnosis not present

## 2018-05-23 DIAGNOSIS — D6489 Other specified anemias: Secondary | ICD-10-CM | POA: Diagnosis not present

## 2018-05-23 DIAGNOSIS — C499 Malignant neoplasm of connective and soft tissue, unspecified: Secondary | ICD-10-CM | POA: Diagnosis not present

## 2018-05-23 DIAGNOSIS — E041 Nontoxic single thyroid nodule: Secondary | ICD-10-CM | POA: Diagnosis not present

## 2018-05-23 DIAGNOSIS — D62 Acute posthemorrhagic anemia: Secondary | ICD-10-CM | POA: Diagnosis not present

## 2018-06-06 ENCOUNTER — Other Ambulatory Visit (INDEPENDENT_AMBULATORY_CARE_PROVIDER_SITE_OTHER): Payer: Medicare Other

## 2018-06-06 ENCOUNTER — Ambulatory Visit (INDEPENDENT_AMBULATORY_CARE_PROVIDER_SITE_OTHER): Payer: Medicare Other | Admitting: Physician Assistant

## 2018-06-06 ENCOUNTER — Encounter: Payer: Self-pay | Admitting: Physician Assistant

## 2018-06-06 VITALS — BP 122/58 | HR 80

## 2018-06-06 DIAGNOSIS — D62 Acute posthemorrhagic anemia: Secondary | ICD-10-CM

## 2018-06-06 DIAGNOSIS — K2971 Gastritis, unspecified, with bleeding: Secondary | ICD-10-CM | POA: Diagnosis not present

## 2018-06-06 DIAGNOSIS — I2510 Atherosclerotic heart disease of native coronary artery without angina pectoris: Secondary | ICD-10-CM

## 2018-06-06 LAB — CBC WITH DIFFERENTIAL/PLATELET
BASOS ABS: 0 10*3/uL (ref 0.0–0.1)
Basophils Relative: 0.6 % (ref 0.0–3.0)
EOS PCT: 1.7 % (ref 0.0–5.0)
Eosinophils Absolute: 0.1 10*3/uL (ref 0.0–0.7)
LYMPHS PCT: 8.9 % — AB (ref 12.0–46.0)
Lymphs Abs: 0.5 10*3/uL — ABNORMAL LOW (ref 0.7–4.0)
MCHC: 34.5 g/dL (ref 30.0–36.0)
MCV: 93.3 fl (ref 78.0–100.0)
MONOS PCT: 7.1 % (ref 3.0–12.0)
Monocytes Absolute: 0.4 10*3/uL (ref 0.1–1.0)
Neutro Abs: 4.3 10*3/uL (ref 1.4–7.7)
Neutrophils Relative %: 81.7 % — ABNORMAL HIGH (ref 43.0–77.0)
Platelets: 192 10*3/uL (ref 150.0–400.0)
RBC: 2.45 Mil/uL — AB (ref 4.22–5.81)
RDW: 17.4 % — ABNORMAL HIGH (ref 11.5–15.5)
WBC: 5.2 10*3/uL (ref 4.0–10.5)

## 2018-06-06 NOTE — Progress Notes (Signed)
Subjective:    Patient ID: [name], male    DOB: 20-Oct-1946, 72 y.o.   MRN: [id number]  HPI [name] is a very nice 72 year old white male, known to Dr. Fuller Plan.  He has history of diverticulosis, adenomatous colon polyps and last had colonoscopy in 2014. Patient also has severe COPD with chronic respiratory failure requiring chronic nasal O2 6 L/min, congestive heart failure, coronary artery disease and hypertension.   He was hospitalized recently 6/29 through 05/18/2018 and comes in today for post hospital follow-up.  He was admitted with acute lower GI bleeding and was seen by GI during his admission.  He had CT scan of the abdomen and pelvis done on the day of admission which showed descending and sigmoid diverticulosis without diverticulitis and a stable heterogeneous partially necrotic appearing mass centered within the mesentery of the right hemiabdomen estimated at 19 x 17 x 16 cm. Patient was not anticoagulated but does take a baby aspirin daily.  Hemoglobin dropped to a low of the 6 range.  He required 4 units of packed RBCs during his admission.  He had persistent bleeding after admission and had CT angiogram done on 05/14/2018.  There was no evidence of active hemorrhage.  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  He had  left hepatic and left adrenal nodules suspected to be metastases based on outside imaging and gallstones. Colonoscopy was not entertained as he is an extremely poor candidate for sedation as he requires 6 L of nasal O2 chronically. Bleeding stopped within 48 hours of admission.  On 05/18/2018 hemoglobin was 10.8 hematocrit of 31.9 at the time of discharge. He was advised to resume his baby aspirin.  He has had follow-up with his PCP Dr. Ardeth Perfect and patient states his hemoglobin was 9.7 on July 16. He says he feels pretty good energy level has  been okay.  He is having no difficulty eating.  She generally having 1 bowel movement per day and says he has seen a dark stool off and on and had a dark stool today.  Bowel movement yesterday was normal.  The stools are not black and tarry just dark. Patient's wife states they have a follow-up with his oncologist at [facility] next week.  [name]  Review of Systems Pertinent positive and negative review of systems were noted in the above HPI section.  All other review of systems was otherwise negative.  Outpatient Encounter Medications as of 06/06/2018  Medication Sig  . Albuterol Sulfate (PROAIR RESPICLICK) 409 (90 Base) MCG/ACT AEPB Inhale 2 puffs into the lungs every 6 (six) hours as needed (shortness of breath).  . ALPRAZolam (XANAX) 0.5 MG tablet Take 0.5 mg by mouth at bedtime.   . Ascorbic Acid (VITAMIN C) 1000 MG tablet Take 1,000 mg by mouth every evening.   [name] aspirin 81 MG tablet Take 81 mg by mouth at bedtime.   . budesonide (PULMICORT) 0.25 MG/2ML nebulizer solution Take 2 mLs (0.25 mg total) by nebulization 2 (two) times daily.  . cholecalciferol (VITAMIN D) 400 units TABS tablet Take 400 Units by mouth at bedtime.  [name] CINNAMON PO Take 1,000 mg by mouth every evening.   . Flaxseed, Linseed, (FLAX SEED OIL PO) Take 1,400 mg by mouth daily.  . furosemide (LASIX) 40 MG tablet Take 40 mg by mouth  daily.   . Garlic 7341 MG CAPS Take 1,000 mg by mouth every evening.   [name] ipratropium-albuterol (DUONEB) 0.5-2.5 (3) MG/3ML SOLN Take 3 mLs by nebulization 4 (four) times daily.  . isosorbide mononitrate (IMDUR) 30 MG 24 hr tablet Take 1 tablet (30 mg total) by mouth daily.  [name] lisinopril (PRINIVIL,ZESTRIL) 10 MG tablet Take 10 mg by mouth daily.  . methocarbamol (ROBAXIN) 500 MG tablet TAKE 1 TO 2 TABLETS BY MOUTH TWICE A DAY AS NEEDED FOR MUSCLE CRAMPS  . multivitamin-lutein (OCUVITE-LUTEIN) CAPS capsule Take 1 capsule  by mouth 2 (two) times daily.  . nitroGLYCERIN (NITROSTAT) 0.4 MG SL tablet Place 1 tablet (0.4 mg total) under the tongue every 5 (five) minutes as needed for chest pain.  . Omega-3 Fatty Acids (FISH OIL) 1000 MG CPDR Take 1,000 mg by mouth daily.  . pantoprazole (PROTONIX) 40 MG tablet Take 1 tablet (40 mg total) by mouth daily.  Vladimir Faster Glycol-Propyl Glycol (SYSTANE OP) Apply 1 drop to eye daily as needed (dry eyes).  . Potassium 99 MG TABS Take 1 tablet by mouth 2 (two) times daily.  . simvastatin (ZOCOR) 20 MG tablet Take 1 tablet (20 mg total) by mouth daily at 6 PM.  . sodium chloride (OCEAN) 0.65 % SOLN nasal spray Place 1 spray into both nostrils as needed for congestion.  . vitamin B-12 (CYANOCOBALAMIN) 1000 MCG tablet Take 1,000 mcg by mouth daily.   No facility-administered encounter medications on file as of 06/06/2018.    Allergies  Allergen Reactions  . Amlodipine Swelling   Patient Active Problem List   Diagnosis Date Noted  . Rectal bleeding 05/13/2018  . Chronic diastolic CHF (congestive heart failure) ([place]) 05/13/2018  . Lower GI bleed 05/13/2018  . Class 2 obesity with body mass index (BMI) of 37.0 to 37.9 in adult 01/10/2018  . 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 BiPAP (biphasic positive airway pressure) dependence 01/27/2017  . Acute on chronic diastolic CHF (congestive heart failure) ([place]) 01/19/2017  . Chronic respiratory failure with hypoxia and hypercapnia ([place]) 10/13/2016  . OSA treated with BiPAP 10/13/2016  . Acute on chronic congestive heart failure ([place])   . Macrocytic anemia 09/24/2016  . COPD with acute exacerbation ([place]) 09/24/2016  . Acute bronchitis 02/03/2016  . Ex-smoker 10/31/2015  . Dilated cardiomyopathy ([place])   . Pulmonary hypertension ([place])   . Acute on chronic respiratory failure with hypoxia and hypercapnia ([place]) 10/20/2015  . Nicotine abuse 10/20/2015  . 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 LBP (low back pain)  03/24/2011  . Malignant neoplasm of bladder ([place]) 09/22/2010  . HEMATURIA UNSPECIFIED 05/14/2010  . Osteoarthritis 03/28/2010  . Coronary atherosclerosis of native coronary artery 12/19/2007  . Acute sinusitis 12/19/2007  . COPD mixed type ([place]) 12/19/2007  . COLONIC POLYPS 12/18/2007  . HYPERCHOLESTEROLEMIA 12/18/2007  . Anxiety 12/18/2007  . Essential hypertension 12/18/2007  . GASTRITIS 12/18/2007  . Impaired fasting glucose 12/18/2007   Social History   Socioeconomic History  . Marital status: Married    Spouse name: [name]  . Number of children: 2  . Years of education: Not on file  . Highest education level: Not on file  Occupational History  . Occupation: Retired  Scientific laboratory technician  . Financial resource strain: Not on file  . Food insecurity:    Worry: Not on file    Inability: Not on file  . Transportation needs:    Medical: Not on file    Non-medical: Not on  file  Tobacco Use  . Smoking status: Former Smoker    Packs/day: 2.00    Years: 45.00    Pack years: 90.00    Types: Cigarettes    Last attempt to quit: 10/17/2015    Years since quitting: 2.6  . Smokeless tobacco: Never Used  Substance and Sexual Activity  . Alcohol use: Yes    Alcohol/week: 0.0 oz    Comment: glass of wine at night  . Drug use: No  . Sexual activity: Not on file  Lifestyle  . Physical activity:    Days per week: Not on file    Minutes per session: Not on file  . Stress: Not on file  Relationships  . Social connections:    Talks on phone: Not on file    Gets together: Not on file    Attends religious service: Not on file    Active member of club or organization: Not on file    Attends meetings of clubs or organizations: Not on file    Relationship status: Not on file  . Intimate partner violence:    Fear of current or ex partner: Not on file    Emotionally abused: Not on file    Physically abused: Not on file    Forced sexual activity: Not on file  Other Topics Concern  . Not on  file  Social History Narrative  . Not on file    Mr. Rathbone family history includes Aneurysm in his brother; Cancer in his brother; Heart disease in his brother and mother.      Objective:    Vitals:   06/06/18 1327  BP: (!) 122/58  Pulse: 80    Physical Exam; well-developed older white male in no acute distress, accompanied by his wife.  Both very pleasant patient is in a wheelchair and on 6 L of nasal O2.  Blood pressure 122/58 pulse 80 was not weighed today.  HEENT; nontraumatic normocephalic EOMI PERRLA sclera anicteric conjunctiva are pink oropharynx benign.  Cardiovascular; regular rate and rhythm with S1-S2, Pulmonary; significantly decreased breath sounds bilaterally, Abdomen ;obese, soft, nontender there is no palpable hepatosplenomegaly he does have some fullness in the right lower quadrant question mass-effect.  Rectal ;exam not done today.  Extremities; no clubbing cyanosis or edema skin warm and dry,  Neuro; psych alert and oriented, grossly nonfocal mood and affect appropriate       Assessment & Plan:   #90 72 year old white male with multiple serious medical problems with chronic respiratory failure secondary to COPD requiring 6 L of nasal O2 chronically who comes in today for post hospital follow-up after recent admission with acute lower GI hemorrhage. This was felt to be a diverticular bleed.  He did require 4 unit transfusion.  CT angiogram was done but there was no evidence of active bleeding at the time of study.  Patient also has a large right lower quadrant spindle cell tumor of the mesentery, there was no evidence of bowel fistulization on CT angio though this is certainly possible.  Exline CT angios also questioned metastases to the left hepatic lobe and left adrenal gland  Patient did have extensive diverticulosis demonstrated on CT and CTO angios and on prior colonoscopy done in 2014. No overt bleeding since discharge from the hospital though hemoglobin drifted  from 10.8 on 05/19/1999 19-9.7 on 05/30/2018.  Patient reports a dark stool off and on. He may be having some low-grade oozing, possibly from fistulization or bowel involvement with the large  spindle cell tumor.  #2 anemia acute on chronic secondary to blood loss 3 severe COPD/chronic respiratory failure on 6 L nasal O2 chronically #4 congestive heart failure #5.  Coronary artery disease #6.  Hypertension #7.  History of adenomatous colon polyps-last colonoscopy 2014.  Patient is not felt to be a candidate for further surveillance colonoscopy due to chronic respiratory failure  Plan; repeat CBC today.  We discussed potential need for periodic transfusion if he continues to have evidence of low-grade GI bleeding. Would plan to follow serial CBCs Patient will follow up with his oncologist at [facility] on 06/20/2018.  I do not think there is anything else to be done from a GI standpoint at this time.  If he has evidence of another acute hemorrhage then CT angiography and potential embolization would be indicated. I will discuss further with Dr. Fuller Plan.    Mettie Roylance S Tobi Leinweber PA-C 06/06/2018   Cc: [name], MD

## 2018-06-06 NOTE — Patient Instructions (Signed)
If you are age 73 or older, your body mass index should be between 23-30. Your There is no height or weight on file to calculate BMI. If this is out of the aforementioned range listed, please consider follow up with your Primary Care Provider.  Follow up as needed.

## 2018-06-07 ENCOUNTER — Emergency Department (HOSPITAL_COMMUNITY): Payer: Medicare Other

## 2018-06-07 ENCOUNTER — Inpatient Hospital Stay (HOSPITAL_COMMUNITY)
Admission: EM | Admit: 2018-06-07 | Discharge: 2018-06-11 | DRG: 378 | Disposition: A | Discharge status: Other Acute Inpatient Hospital | Payer: Medicare Other | Attending: Family Medicine | Admitting: Family Medicine

## 2018-06-07 ENCOUNTER — Other Ambulatory Visit: Payer: Self-pay

## 2018-06-07 ENCOUNTER — Encounter (HOSPITAL_COMMUNITY): Payer: Self-pay | Admitting: *Deleted

## 2018-06-07 DIAGNOSIS — G8929 Other chronic pain: Secondary | ICD-10-CM | POA: Diagnosis present

## 2018-06-07 DIAGNOSIS — D696 Thrombocytopenia, unspecified: Secondary | ICD-10-CM | POA: Diagnosis not present

## 2018-06-07 DIAGNOSIS — Z79899 Other long term (current) drug therapy: Secondary | ICD-10-CM | POA: Diagnosis not present

## 2018-06-07 DIAGNOSIS — K921 Melena: Secondary | ICD-10-CM | POA: Diagnosis not present

## 2018-06-07 DIAGNOSIS — I708 Atherosclerosis of other arteries: Secondary | ICD-10-CM | POA: Diagnosis present

## 2018-06-07 DIAGNOSIS — I5032 Chronic diastolic (congestive) heart failure: Secondary | ICD-10-CM | POA: Diagnosis present

## 2018-06-07 DIAGNOSIS — C481 Malignant neoplasm of specified parts of peritoneum: Secondary | ICD-10-CM | POA: Diagnosis present

## 2018-06-07 DIAGNOSIS — K922 Gastrointestinal hemorrhage, unspecified: Secondary | ICD-10-CM

## 2018-06-07 DIAGNOSIS — I10 Essential (primary) hypertension: Secondary | ICD-10-CM | POA: Diagnosis present

## 2018-06-07 DIAGNOSIS — G4733 Obstructive sleep apnea (adult) (pediatric): Secondary | ICD-10-CM | POA: Diagnosis present

## 2018-06-07 DIAGNOSIS — Z7982 Long term (current) use of aspirin: Secondary | ICD-10-CM | POA: Diagnosis not present

## 2018-06-07 DIAGNOSIS — J449 Chronic obstructive pulmonary disease, unspecified: Secondary | ICD-10-CM | POA: Diagnosis present

## 2018-06-07 DIAGNOSIS — D649 Anemia, unspecified: Secondary | ICD-10-CM

## 2018-06-07 DIAGNOSIS — I2510 Atherosclerotic heart disease of native coronary artery without angina pectoris: Secondary | ICD-10-CM | POA: Diagnosis present

## 2018-06-07 DIAGNOSIS — Z955 Presence of coronary angioplasty implant and graft: Secondary | ICD-10-CM

## 2018-06-07 DIAGNOSIS — J9612 Chronic respiratory failure with hypercapnia: Secondary | ICD-10-CM | POA: Diagnosis present

## 2018-06-07 DIAGNOSIS — I700 Atherosclerosis of aorta: Secondary | ICD-10-CM | POA: Diagnosis not present

## 2018-06-07 DIAGNOSIS — J9611 Chronic respiratory failure with hypoxia: Secondary | ICD-10-CM | POA: Diagnosis present

## 2018-06-07 DIAGNOSIS — I110 Hypertensive heart disease with heart failure: Secondary | ICD-10-CM | POA: Diagnosis present

## 2018-06-07 DIAGNOSIS — E875 Hyperkalemia: Secondary | ICD-10-CM | POA: Diagnosis present

## 2018-06-07 DIAGNOSIS — K5730 Diverticulosis of large intestine without perforation or abscess without bleeding: Secondary | ICD-10-CM | POA: Diagnosis not present

## 2018-06-07 DIAGNOSIS — Z87891 Personal history of nicotine dependence: Secondary | ICD-10-CM

## 2018-06-07 DIAGNOSIS — K2971 Gastritis, unspecified, with bleeding: Secondary | ICD-10-CM

## 2018-06-07 DIAGNOSIS — D62 Acute posthemorrhagic anemia: Secondary | ICD-10-CM

## 2018-06-07 DIAGNOSIS — R040 Epistaxis: Secondary | ICD-10-CM | POA: Diagnosis present

## 2018-06-07 DIAGNOSIS — C801 Malignant (primary) neoplasm, unspecified: Secondary | ICD-10-CM | POA: Diagnosis present

## 2018-06-07 DIAGNOSIS — K625 Hemorrhage of anus and rectum: Secondary | ICD-10-CM

## 2018-06-07 DIAGNOSIS — C499 Malignant neoplasm of connective and soft tissue, unspecified: Secondary | ICD-10-CM

## 2018-06-07 DIAGNOSIS — Z7951 Long term (current) use of inhaled steroids: Secondary | ICD-10-CM | POA: Diagnosis not present

## 2018-06-07 DIAGNOSIS — Z9981 Dependence on supplemental oxygen: Secondary | ICD-10-CM | POA: Diagnosis not present

## 2018-06-07 DIAGNOSIS — R42 Dizziness and giddiness: Secondary | ICD-10-CM | POA: Diagnosis not present

## 2018-06-07 DIAGNOSIS — D500 Iron deficiency anemia secondary to blood loss (chronic): Secondary | ICD-10-CM | POA: Diagnosis not present

## 2018-06-07 DIAGNOSIS — Z8551 Personal history of malignant neoplasm of bladder: Secondary | ICD-10-CM

## 2018-06-07 DIAGNOSIS — I70203 Unspecified atherosclerosis of native arteries of extremities, bilateral legs: Secondary | ICD-10-CM | POA: Diagnosis present

## 2018-06-07 DIAGNOSIS — E7800 Pure hypercholesterolemia, unspecified: Secondary | ICD-10-CM | POA: Diagnosis present

## 2018-06-07 DIAGNOSIS — R109 Unspecified abdominal pain: Secondary | ICD-10-CM | POA: Diagnosis not present

## 2018-06-07 DIAGNOSIS — Z9989 Dependence on other enabling machines and devices: Secondary | ICD-10-CM | POA: Diagnosis not present

## 2018-06-07 LAB — CBC
HEMATOCRIT: 21.7 % — AB (ref 39.0–52.0)
HEMOGLOBIN: 7 g/dL — AB (ref 13.0–17.0)
MCH: 31.5 pg (ref 26.0–34.0)
MCHC: 32.3 g/dL (ref 30.0–36.0)
MCV: 97.7 fL (ref 78.0–100.0)
Platelets: 193 10*3/uL (ref 150–400)
RBC: 2.22 MIL/uL — AB (ref 4.22–5.81)
RDW: 18.4 % — ABNORMAL HIGH (ref 11.5–15.5)
WBC: 7 10*3/uL (ref 4.0–10.5)

## 2018-06-07 LAB — COMPREHENSIVE METABOLIC PANEL
ALBUMIN: 3.4 g/dL — AB (ref 3.5–5.0)
ALT: 37 U/L (ref 0–44)
ANION GAP: 9 (ref 5–15)
AST: 34 U/L (ref 15–41)
Alkaline Phosphatase: 42 U/L (ref 38–126)
BUN: 25 mg/dL — ABNORMAL HIGH (ref 8–23)
CO2: 25 mmol/L (ref 22–32)
Calcium: 8.7 mg/dL — ABNORMAL LOW (ref 8.9–10.3)
Chloride: 106 mmol/L (ref 98–111)
Creatinine, Ser: 0.9 mg/dL (ref 0.61–1.24)
GFR calc non Af Amer: >60 mL/min (ref >60)
GLUCOSE: 213 mg/dL — AB (ref 70–99)
POTASSIUM: 4.3 mmol/L (ref 3.5–5.1)
SODIUM: 140 mmol/L (ref 135–145)
Total Bilirubin: 1.5 mg/dL — ABNORMAL HIGH (ref 0.3–1.2)
Total Protein: 6.2 g/dL — ABNORMAL LOW (ref 6.5–8.1)

## 2018-06-07 LAB — I-STAT CG4 LACTIC ACID, ED: Lactic Acid, Venous: 2.32 mmol/L (ref 0.5–1.9)

## 2018-06-07 LAB — I-STAT TROPONIN, ED: TROPONIN I, POC: 0 ng/mL (ref 0.00–0.08)

## 2018-06-07 LAB — POC OCCULT BLOOD, ED: FECAL OCCULT BLD: POSITIVE — AB

## 2018-06-07 LAB — PROTIME-INR
INR: 1.1
Prothrombin Time: 14.1 seconds (ref 11.4–15.2)

## 2018-06-07 LAB — LIPASE, BLOOD: Lipase: 29 U/L (ref 11–51)

## 2018-06-07 LAB — PREPARE RBC (CROSSMATCH)

## 2018-06-07 MED ORDER — METHOCARBAMOL 500 MG PO TABS
500.0000 mg | ORAL_TABLET | Freq: Two times a day (BID) | ORAL | Status: DC | PRN
  Administered 2018-06-07 – 2018-06-10 (×4): 500 mg via ORAL
  Filled 2018-06-07 (×4): qty 1

## 2018-06-07 MED ORDER — SODIUM CHLORIDE 0.9% IV SOLUTION
Freq: Once | INTRAVENOUS | Status: AC
  Administered 2018-06-07: 20:00:00 via INTRAVENOUS

## 2018-06-07 MED ORDER — ALPRAZOLAM 0.5 MG PO TABS
0.5000 mg | ORAL_TABLET | Freq: Every day | ORAL | Status: DC
  Administered 2018-06-07 – 2018-06-10 (×4): 0.5 mg via ORAL
  Filled 2018-06-07 (×4): qty 1

## 2018-06-07 MED ORDER — IOPAMIDOL (ISOVUE-300) INJECTION 61%
100.0000 mL | Freq: Once | INTRAVENOUS | Status: AC | PRN
  Administered 2018-06-07: 100 mL via INTRAVENOUS

## 2018-06-07 MED ORDER — VITAMIN B-12 1000 MCG PO TABS
1000.0000 ug | ORAL_TABLET | Freq: Every day | ORAL | Status: DC
  Administered 2018-06-08 – 2018-06-11 (×4): 1000 ug via ORAL
  Filled 2018-06-07 (×4): qty 1

## 2018-06-07 MED ORDER — GUAIFENESIN ER 600 MG PO TB12
1200.0000 mg | ORAL_TABLET | Freq: Two times a day (BID) | ORAL | Status: DC
  Administered 2018-06-07 – 2018-06-11 (×8): 1200 mg via ORAL
  Filled 2018-06-07 (×8): qty 2

## 2018-06-07 MED ORDER — ONDANSETRON HCL 4 MG/2ML IJ SOLN: 4.0000 mg | Freq: Four times a day (QID) | INTRAMUSCULAR | Status: DC | PRN

## 2018-06-07 MED ORDER — ACETAMINOPHEN 650 MG RE SUPP: 650.0000 mg | Freq: Four times a day (QID) | RECTAL | Status: DC | PRN

## 2018-06-07 MED ORDER — POTASSIUM GLUCONATE 595 (99 K) MG PO TABS
595.0000 mg | ORAL_TABLET | Freq: Two times a day (BID) | ORAL | Status: DC
  Administered 2018-06-07 – 2018-06-11 (×8): 595 mg via ORAL
  Filled 2018-06-07 (×9): qty 1

## 2018-06-07 MED ORDER — HYDROMORPHONE HCL 1 MG/ML IJ SOLN: 0.5000 mg | INTRAMUSCULAR | Status: DC | PRN

## 2018-06-07 MED ORDER — PANTOPRAZOLE SODIUM 40 MG PO TBEC
40.0000 mg | DELAYED_RELEASE_TABLET | Freq: Every day | ORAL | Status: DC
  Administered 2018-06-08 – 2018-06-09 (×2): 40 mg via ORAL
  Filled 2018-06-07 (×2): qty 1

## 2018-06-07 MED ORDER — IPRATROPIUM-ALBUTEROL 0.5-2.5 (3) MG/3ML IN SOLN
3.0000 mL | Freq: Four times a day (QID) | RESPIRATORY_TRACT | Status: DC
  Administered 2018-06-07 – 2018-06-11 (×16): 3 mL via RESPIRATORY_TRACT
  Filled 2018-06-07 (×16): qty 3

## 2018-06-07 MED ORDER — FUROSEMIDE 40 MG PO TABS
40.0000 mg | ORAL_TABLET | Freq: Every day | ORAL | Status: DC
  Administered 2018-06-08 – 2018-06-11 (×4): 40 mg via ORAL
  Filled 2018-06-07 (×4): qty 1

## 2018-06-07 MED ORDER — ACETAMINOPHEN 325 MG PO TABS: 650.0000 mg | ORAL_TABLET | Freq: Four times a day (QID) | ORAL | Status: DC | PRN

## 2018-06-07 MED ORDER — SIMVASTATIN 20 MG PO TABS
20.0000 mg | ORAL_TABLET | Freq: Every day | ORAL | Status: DC
  Administered 2018-06-08 – 2018-06-11 (×4): 20 mg via ORAL
  Filled 2018-06-07 (×4): qty 1

## 2018-06-07 MED ORDER — ALBUTEROL SULFATE (2.5 MG/3ML) 0.083% IN NEBU: 2.5000 mg | INHALATION_SOLUTION | Freq: Four times a day (QID) | RESPIRATORY_TRACT | Status: DC | PRN

## 2018-06-07 MED ORDER — BUDESONIDE 0.25 MG/2ML IN SUSP
RESPIRATORY_TRACT | Status: AC
  Filled 2018-06-07: qty 2

## 2018-06-07 MED ORDER — PROSIGHT PO TABS
1.0000 | ORAL_TABLET | Freq: Every evening | ORAL | Status: DC
  Administered 2018-06-07 – 2018-06-11 (×5): 1 via ORAL
  Filled 2018-06-07 (×5): qty 1

## 2018-06-07 MED ORDER — ISOSORBIDE MONONITRATE ER 30 MG PO TB24: 30.0000 mg | ORAL_TABLET | Freq: Every day | ORAL | Status: DC

## 2018-06-07 MED ORDER — IOPAMIDOL (ISOVUE-300) INJECTION 61%
INTRAVENOUS | Status: AC
  Filled 2018-06-07: qty 100

## 2018-06-07 MED ORDER — CHOLECALCIFEROL 10 MCG (400 UNIT) PO TABS
400.0000 [IU] | ORAL_TABLET | Freq: Every day | ORAL | Status: DC
  Administered 2018-06-07 – 2018-06-10 (×4): 400 [IU] via ORAL
  Filled 2018-06-07 (×5): qty 1

## 2018-06-07 MED ORDER — ONDANSETRON HCL 4 MG PO TABS: 4.0000 mg | ORAL_TABLET | Freq: Four times a day (QID) | ORAL | Status: DC | PRN

## 2018-06-07 MED ORDER — BUDESONIDE 0.25 MG/2ML IN SUSP
0.2500 mg | Freq: Two times a day (BID) | RESPIRATORY_TRACT | Status: DC
  Administered 2018-06-07 – 2018-06-11 (×8): 0.25 mg via RESPIRATORY_TRACT
  Filled 2018-06-07 (×8): qty 2

## 2018-06-07 NOTE — Progress Notes (Signed)
Reviewed and agree with initial management plan. Pt evaluated by Drs. Ardis Hughs and [place] during recent hospitalization and they decided not to proceed with colonoscopy due to the high risk. Next step is transfusion and evaluation at [facility].   [name]. Fuller Plan, MD [facility]

## 2018-06-07 NOTE — ED Notes (Signed)
ED TO INPATIENT HANDOFF REPORT  Name/Age/Gender [name] 72 y.o. male  Code Status    Code Status Orders  (From admission, onward)        Start     Ordered   06/07/18 2029  Full code  Continuous     06/07/18 2031    Code Status History    Date Active Date Inactive Code Status Order ID Comments User Context   05/13/2018 2145 05/18/2018 2157 Full Code [id number]  Toy Baker, MD Inpatient   01/19/2017 1957 01/26/2017 1550 Full Code [id number]  Rama, Venetia Maxon, MD ED   09/24/2016 0246 09/29/2016 2000 Full Code [id number]  Vianne Bulls, MD ED   10/20/2015 1934 10/29/2015 1653 Full Code [id number]  Mendel Corning, MD Inpatient      Home/SNF/Other Home  Chief Complaint Needs blood transfusion, sent by dr  Level of Care/Admitting Diagnosis ED Disposition    ED Disposition Condition [place]: [facility] [[id number]]  Level of Care: Telemetry [5]  Admit to tele based on following criteria: Other see comments  Comments: GI bleed  Diagnosis: Lower GI bleed [[id number]]  Admitting Physician: Etta Quill [[id number]]  Attending Physician: Etta Quill [[id number]]  Estimated length of stay: past midnight tomorrow  Certification:: I certify this patient will need inpatient services for at least 2 midnights  PT Class (Do Not Modify): Inpatient [101]  PT Acc Code (Do Not Modify): Private [1]       Medical History Past Medical History:  Diagnosis Date  . Anxiety   . Bladder cancer ([place]) 2011  . Borderline diabetes mellitus   . CAD (coronary artery disease)   . Cigarette smoker   . COPD (chronic obstructive pulmonary disease) ([place])   . DJD (degenerative joint disease)   . Emphysema of lung ([place])   . Epistaxis   . History of colonic polyps 2004   hyperplastic   . History of sinusitis   . Hypercholesteremia   . Hypertension   . Obesity     Allergies Allergies  Allergen Reactions  . Amlodipine Swelling    IV  Location/Drains/Wounds Patient Lines/Drains/Airways Status   Active Line/Drains/Airways    Name:   Placement date:   Placement time:   Site:   Days:   Peripheral IV 06/07/18 Left Antecubital   06/07/18    1639    Antecubital   less than 1   Peripheral IV 06/07/18 Left Hand   06/07/18    1827    Hand   less than 1   Incision (Closed) 03/02/17 Abdomen Right;Lower   03/02/17    1440     462          Labs/Imaging Results for orders placed or performed during the hospital encounter of 06/07/18 (from the past 48 hour(s))  Comprehensive metabolic panel     Status: Abnormal   Collection Time: 06/07/18  4:31 PM  Result Value Ref Range   Sodium 140 135 - 145 mmol/L   Potassium 4.3 3.5 - 5.1 mmol/L   Chloride 106 98 - 111 mmol/L   CO2 25 22 - 32 mmol/L   Glucose, Bld 213 (H) 70 - 99 mg/dL   BUN 25 (H) 8 - 23 mg/dL   Creatinine, Ser 0.90 0.61 - 1.24 mg/dL   Calcium 8.7 (L) 8.9 - 10.3 mg/dL   Total Protein 6.2 (L) 6.5 - 8.1 g/dL   Albumin 3.4 (L) 3.5 - 5.0 g/dL  AST 34 15 - 41 U/L   ALT 37 0 - 44 U/L   Alkaline Phosphatase 42 38 - 126 U/L   Total Bilirubin 1.5 (H) 0.3 - 1.2 mg/dL   GFR calc non Af Amer >60 >60 mL/min   GFR calc Af Amer >60 >60 mL/min    Comment: (NOTE) The eGFR has been calculated using the CKD EPI equation. This calculation has not been validated in all clinical situations. eGFR's persistently <60 mL/min signify possible Chronic Kidney Disease.    Anion gap 9 5 - 15    Comment: Performed at [facility], [place] [address], [place], [place] [address]  CBC     Status: Abnormal   Collection Time: 06/07/18  4:31 PM  Result Value Ref Range   WBC 7.0 4.0 - 10.5 K/uL   RBC 2.22 (L) 4.22 - 5.81 MIL/uL   Hemoglobin 7.0 (L) 13.0 - 17.0 g/dL   HCT 21.7 (L) 39.0 - 52.0 %   MCV 97.7 78.0 - 100.0 fL   MCH 31.5 26.0 - 34.0 pg   MCHC 32.3 30.0 - 36.0 g/dL   RDW 18.4 (H) 11.5 - 15.5 %   Platelets 193 150 - 400 K/uL    Comment: Performed at [facility], [place] [address] [place], [place] [address]  Type and screen [place]     Status: None (Preliminary result)   Collection Time: 06/07/18  4:31 PM  Result Value Ref Range   ABO/RH(D) A POS    Antibody Screen NEG    Sample Expiration 06/10/2018    Unit Number [id number]    Blood Component Type RED CELLS,LR    Unit division 00    Status of Unit ALLOCATED    Transfusion Status OK TO TRANSFUSE    Crossmatch Result Compatible    Unit Number [id number]    Blood Component Type RED CELLS,LR    Unit division 00    Status of Unit ISSUED    Transfusion Status OK TO TRANSFUSE    Crossmatch Result      Compatible Performed at [facility], [place] [address] [place], [place] [address]   Lipase, blood     Status: None   Collection Time: 06/07/18  4:44 PM  Result Value Ref Range   Lipase 29 11 - 51 U/L    Comment: Performed at [facility], [place] [address] [place], [place] [address]  Protime-INR     Status: None   Collection Time: 06/07/18  4:44 PM  Result Value Ref Range   Prothrombin Time 14.1 11.4 - 15.2 seconds   INR 1.10     Comment: Performed at [facility], [place] [address] [place], [place] [address]  POC occult blood, ED     Status: Abnormal   Collection Time: 06/07/18  4:48 PM  Result Value Ref Range   Fecal Occult Bld POSITIVE (A) NEGATIVE  I-Stat Troponin, ED (not at [facility])     Status: None   Collection Time: 06/07/18  4:53 PM  Result Value Ref Range   Troponin i, poc 0.00 0.00 - 0.08 ng/mL   Comment 3            Comment: Due to the release kinetics of cTnI, a negative result within the first hours of the onset of symptoms does not rule out myocardial infarction with certainty. If myocardial infarction is still suspected, repeat the test at appropriate intervals.   I-Stat CG4  Lactic Acid, ED     Status: Abnormal   Collection Time: 06/07/18  4:55 PM  Result Value Ref Range    Lactic Acid, Venous 2.32 (HH) 0.5 - 1.9 mmol/L   Comment NOTIFIED PHYSICIAN   Prepare RBC     Status: None   Collection Time: 06/07/18  5:19 PM  Result Value Ref Range   Order Confirmation      ORDER PROCESSED BY BLOOD BANK Performed at [facility], [place] [address], [place], [place] [address]    Ct Abdomen Pelvis W Contrast  Result Date: 06/07/2018 CLINICAL DATA:  GI bleed, tarry stools beginning yesterday. History of bladder cancer, hyperplastic colonic polyps, mesenteric spindle cell carcinoma. EXAM: CT ABDOMEN AND PELVIS WITH CONTRAST TECHNIQUE: Multidetector CT imaging of the abdomen and pelvis was performed using the standard protocol following bolus administration of intravenous contrast. CONTRAST:  17m ISOVUE-300 IOPAMIDOL (ISOVUE-300) INJECTION 61% COMPARISON:  CT abdomen and pelvis May 14, 2018. FINDINGS: LOWER CHEST: Severe centrilobular emphysema. Included heart size is normal. Severe coronary artery calcifications. HEPATOBILIARY: Stable 4.5 cm hypodense mass LEFT lobe of the liver. Mild hepatomegaly. Minimal layering gallbladder sludge versus tiny stones without CT findings of acute cholecystitis. PANCREAS: Mildly atrophic, nonacute. SPLEEN: Normal. ADRENALS/URINARY TRACT: Kidneys are orthotopic, demonstrating symmetric enhancement. 2 mm nonobstructing RIGHT nephrolithiasis. No hydronephrosis or solid renal masses. Stable diffusely hypodense 6.9 cm RIGHT interpolar benign-appearing cyst. Too small to characterize hypodensities LEFT kidneys. The unopacified ureters are normal in course and caliber. Delayed imaging through the kidneys demonstrates symmetric prompt contrast excretion within the proximal urinary collecting system. Urinary bladder is decompressed and unremarkable. Stable 12 mm LEFT adrenal nodule. STOMACH/BOWEL: The stomach, small and large bowel are normal in course and caliber without inflammatory changes, sensitivity decreased without oral contrast.  Severe sigmoid colonic diverticulosis. Scattered a additional colonic diverticula. Normal appendix. VASCULAR/LYMPHATIC: Ectatic infrarenal aorta without aneurysm. Severe calcific atherosclerosis. No lymphadenopathy by CT size criteria. REPRODUCTIVE: Normal. OTHER: At least 25 cm stable lobulated solid mass RIGHT lower quadrant displacing the adjacent bowel. No intraperitoneal free fluid or free air. Granulomas in the pelvis. MUSCULOSKELETAL: Nonacute. Severe L5-S1 disc height loss and endplate spurring resulting in moderate to severe neural foraminal narrowing. IMPRESSION: 1. Colonic diverticulosis without acute diverticulitis nor acute intra-abdominal/pelvic process. 2. Stable at least 25 cm RIGHT lower mesenteric mass previously reported a spindle cell cancer. 3. Stable hepatic and LEFT adrenal masses previously reported is suspected metastasis. 4. 2 mm nonobstructing RIGHT nephrolithiasis. Emphysema (ICD10-J43.9).  Aortic Atherosclerosis (ICD10-I70.0). Electronically Signed   By: CElon AlasM.D.   On: 06/07/2018 18:20    Pending Labs Unresulted Labs (From admission, onward)   Start     Ordered   06/08/18 0500  CBC  Tomorrow morning,   R     06/07/18 2020   06/08/18 05597 Basic metabolic panel  Tomorrow morning,   R     06/07/18 2020      Vitals/Pain Today's Vitals   06/07/18 1933 06/07/18 1944 06/07/18 1952 06/07/18 2030  BP: 131/69  134/74 134/76  Pulse: 93  98 93  Resp: 15  (!) 22 19  Temp: 98.6 F (37 C)  98.8 F (37.1 C)   TempSrc: Oral  Oral   SpO2:    100%  PainSc:  0-No pain      Isolation Precautions No active isolations  Medications Medications  iopamidol (ISOVUE-300) 61 % injection (has no administration in time range)  vitamin B-12 (CYANOCOBALAMIN) tablet 1,000 mcg (has  no administration in time range)  simvastatin (ZOCOR) tablet 20 mg (has no administration in time range)  budesonide (PULMICORT) nebulizer solution 0.25 mg (has no administration in time range)   ALPRAZolam (XANAX) tablet 0.5 mg (has no administration in time range)  Albuterol Sulfate AEPB 2 puff (has no administration in time range)  cholecalciferol (VITAMIN D) tablet 400 Units (has no administration in time range)  ipratropium-albuterol (DUONEB) 0.5-2.5 (3) MG/3ML nebulizer solution 3 mL (has no administration in time range)  methocarbamol (ROBAXIN) tablet 500-1,000 mg (has no administration in time range)  multivitamin-lutein (OCUVITE-LUTEIN) capsule 1 capsule (has no administration in time range)  pantoprazole (PROTONIX) EC tablet 40 mg (has no administration in time range)  HYDROmorphone (DILAUDID) injection 0.5-1 mg (has no administration in time range)  furosemide (LASIX) tablet 40 mg (has no administration in time range)  Potassium TABS 99 mg (has no administration in time range)  acetaminophen (TYLENOL) tablet 650 mg (has no administration in time range)    Or  acetaminophen (TYLENOL) suppository 650 mg (has no administration in time range)  ondansetron (ZOFRAN) tablet 4 mg (has no administration in time range)    Or  ondansetron (ZOFRAN) injection 4 mg (has no administration in time range)  0.9 %  sodium chloride infusion (Manually program via Guardrails IV Fluids) ( Intravenous New Bag/Given 06/07/18 1942)  iopamidol (ISOVUE-300) 61 % injection 100 mL (100 mLs Intravenous Contrast Given 06/07/18 1753)    Mobility manual wheelchair (due to weakness)

## 2018-06-07 NOTE — ED Provider Notes (Signed)
[facility] DEPT Provider Note   CSN: [id number] Arrival date & time: 06/07/18  1549     History   Chief Complaint Chief Complaint  Patient presents with  . GI Bleeding  . Abnormal Lab    HPI [name] is a 72 y.o. male.  The history is provided by the patient, the spouse and medical records. No language interpreter was used.  Rectal Bleeding  Quality:  Black and tarry and bright red Amount:  Moderate Duration:  2 days Timing:  Constant Chronicity:  Recurrent Context comment:  Abdominal Cancer Similar prior episodes: yes   Relieved by:  Nothing Worsened by:  Nothing Ineffective treatments:  None tried Associated symptoms: abdominal pain and light-headedness   Associated symptoms: no dizziness, no fever, no hematemesis and no vomiting   Risk factors: hx of colorectal cancer     Past Medical History:  Diagnosis Date  . Anxiety   . Bladder cancer ([place]) 2011  . Borderline diabetes mellitus   . CAD (coronary artery disease)   . Cigarette smoker   . COPD (chronic obstructive pulmonary disease) ([place])   . DJD (degenerative joint disease)   . Emphysema of lung ([place])   . Epistaxis   . History of colonic polyps 2004   hyperplastic   . History of sinusitis   . Hypercholesteremia   . Hypertension   . Obesity     Patient Active Problem List   Diagnosis Date Noted  . Rectal bleeding 05/13/2018  . Chronic diastolic CHF (congestive heart failure) ([place]) 05/13/2018  . Lower GI bleed 05/13/2018  . Class 2 obesity with body mass index (BMI) of 37.0 to 37.9 in adult 01/10/2018  . 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 BiPAP (biphasic positive airway pressure) dependence 01/27/2017  . Acute on chronic diastolic CHF (congestive heart failure) ([place]) 01/19/2017  . Chronic respiratory failure with hypoxia and hypercapnia ([place]) 10/13/2016  . OSA treated with BiPAP 10/13/2016  . Acute on chronic congestive heart failure ([place])   . Macrocytic  anemia 09/24/2016  . COPD with acute exacerbation ([place]) 09/24/2016  . Acute bronchitis 02/03/2016  . Ex-smoker 10/31/2015  . Dilated cardiomyopathy ([place])   . Pulmonary hypertension ([place])   . Acute on chronic respiratory failure with hypoxia and hypercapnia ([place]) 10/20/2015  . Nicotine abuse 10/20/2015  . 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 LBP (low back pain) 03/24/2011  . Malignant neoplasm of bladder ([place]) 09/22/2010  . HEMATURIA UNSPECIFIED 05/14/2010  . Osteoarthritis 03/28/2010  . Coronary atherosclerosis of native coronary artery 12/19/2007  . Acute sinusitis 12/19/2007  . COPD mixed type ([place]) 12/19/2007  . COLONIC POLYPS 12/18/2007  . HYPERCHOLESTEROLEMIA 12/18/2007  . Anxiety 12/18/2007  . Essential hypertension 12/18/2007  . GASTRITIS 12/18/2007  . Impaired fasting glucose 12/18/2007    Past Surgical History:  Procedure Laterality Date  . cataract surgery  septemeber 2013  . cataract surgery/sub posterior vitrectomy in left eye  1996   Dr. Zadie Rhine  . PTCA  [phone]        Home Medications    Prior to Admission medications   Medication Sig Start Date End Date Taking? Authorizing Provider  Albuterol Sulfate (PROAIR RESPICLICK) 546 (90 Base) MCG/ACT AEPB Inhale 2 puffs into the lungs every 6 (six) hours as needed (shortness of breath).    [provider]  ALPRAZolam Duanne Moron) 0.5 MG tablet Take 0.5 mg by mouth at bedtime.     [provider]  Ascorbic Acid (VITAMIN C)  1000 MG tablet Take 1,000 mg by mouth every evening.     [provider]  aspirin 81 MG tablet Take 81 mg by mouth at bedtime.     [provider]  budesonide (PULMICORT) 0.25 MG/2ML nebulizer solution Take 2 mLs (0.25 mg total) by nebulization 2 (two) times daily. 01/10/18   Noralee Space, MD  cholecalciferol (VITAMIN D) 400 units TABS tablet Take 400 Units by mouth at bedtime.    [provider]  CINNAMON PO Take 1,000 mg by mouth every  evening.     [provider]  Flaxseed, Linseed, (FLAX SEED OIL PO) Take 1,400 mg by mouth daily.    [provider]  furosemide (LASIX) 40 MG tablet Take 40 mg by mouth daily.     [provider]  Garlic 5465 MG CAPS Take 1,000 mg by mouth every evening.     [provider]  ipratropium-albuterol (DUONEB) 0.5-2.5 (3) MG/3ML SOLN Take 3 mLs by nebulization 4 (four) times daily. 01/10/18   Noralee Space, MD  isosorbide mononitrate (IMDUR) 30 MG 24 hr tablet Take 1 tablet (30 mg total) by mouth daily. 03/16/16   Richardson Dopp T, PA-C  lisinopril (PRINIVIL,ZESTRIL) 10 MG tablet Take 10 mg by mouth daily. 12/05/16   [provider]  methocarbamol (ROBAXIN) 500 MG tablet TAKE 1 TO 2 TABLETS BY MOUTH TWICE A DAY AS NEEDED FOR MUSCLE CRAMPS 03/07/18   [provider]  multivitamin-lutein (OCUVITE-LUTEIN) CAPS capsule Take 1 capsule by mouth 2 (two) times daily.    [provider]  nitroGLYCERIN (NITROSTAT) 0.4 MG SL tablet Place 1 tablet (0.4 mg total) under the tongue every 5 (five) minutes as needed for chest pain. 11/19/15   Richardson Dopp T, PA-C  Omega-3 Fatty Acids (FISH OIL) 1000 MG CPDR Take 1,000 mg by mouth daily.    [provider]  pantoprazole (PROTONIX) 40 MG tablet Take 1 tablet (40 mg total) by mouth daily. 05/18/18 06/17/18  Shelly Coss, MD  Polyethyl Glycol-Propyl Glycol (SYSTANE OP) Apply 1 drop to eye daily as needed (dry eyes).    [provider]  Potassium 99 MG TABS Take 1 tablet by mouth 2 (two) times daily.    [provider]  simvastatin (ZOCOR) 20 MG tablet Take 1 tablet (20 mg total) by mouth daily at 6 PM. 11/19/15   Kathlen Mody, Nicki Reaper T, PA-C  sodium chloride (OCEAN) 0.65 % SOLN nasal spray Place 1 spray into both nostrils as needed for congestion. 10/29/15   Cherene Altes, MD  vitamin B-12 (CYANOCOBALAMIN) 1000 MCG tablet Take 1,000 mcg by mouth daily.    [provider]    Family  History Family History  Problem Relation Age of Onset  . Heart disease Mother   . Heart disease Brother   . Cancer Brother   . Aneurysm Brother     Social History Social History   Tobacco Use  . Smoking status: Former Smoker    Packs/day: 2.00    Years: 45.00    Pack years: 90.00    Types: Cigarettes    Last attempt to quit: 10/17/2015    Years since quitting: 2.6  . Smokeless tobacco: Never Used  Substance Use Topics  . Alcohol use: Yes    Alcohol/week: 0.0 oz    Comment: glass of wine at night  . Drug use: No     Allergies   Amlodipine   Review of Systems Review of Systems  Constitutional: Positive  for fatigue. Negative for chills, diaphoresis and fever.  HENT: Negative for congestion.   Eyes: Negative for visual disturbance.  Respiratory: Negative for cough, chest tightness and shortness of breath.   Cardiovascular: Negative for chest pain and palpitations.  Gastrointestinal: Positive for abdominal pain, anal bleeding, blood in stool and hematochezia. Negative for abdominal distention, constipation, diarrhea, hematemesis, nausea and vomiting.  Genitourinary: Negative for flank pain.  Musculoskeletal: Negative for back pain, neck pain and neck stiffness.  Neurological: Positive for light-headedness. Negative for dizziness, facial asymmetry and numbness.  Psychiatric/Behavioral: Negative for agitation.  All other systems reviewed and are negative.    Physical Exam Updated Vital Signs BP 135/66 (BP Location: Left Arm)   Pulse (!) 110   Temp 98.2 F (36.8 C) (Oral)   Resp 18   SpO2 99%   Physical Exam  Constitutional: He is oriented to person, place, and time. He appears well-developed and well-nourished. No distress.  HENT:  Head: Normocephalic and atraumatic.  Mouth/Throat: Oropharynx is clear and moist. No oropharyngeal exudate.  Eyes: Pupils are equal, round, and reactive to light. Conjunctivae and EOM are normal.  Neck: Neck supple.  Cardiovascular:  Regular rhythm. Tachycardia present.  No murmur heard. Pulmonary/Chest: Effort normal and breath sounds normal. Tachypnea noted. No respiratory distress. He has no wheezes. He has no rales. He exhibits no tenderness.  Abdominal: Soft. There is tenderness in the right lower quadrant, suprapubic area and left lower quadrant. There is no rigidity, no rebound, no guarding and no CVA tenderness.    Musculoskeletal: He exhibits no edema or tenderness.  Neurological: He is alert and oriented to person, place, and time.  Skin: Skin is warm and dry. Capillary refill takes less than 2 seconds. He is not diaphoretic. No erythema. There is pallor.  Psychiatric: He has a normal mood and affect.  Nursing note and vitals reviewed.    ED Treatments / Results  Labs (all labs ordered are listed, but only abnormal results are displayed) Labs Reviewed  COMPREHENSIVE METABOLIC PANEL - Abnormal; Notable for the following components:      Result Value   Glucose, Bld 213 (*)    BUN 25 (*)    Calcium 8.7 (*)    Total Protein 6.2 (*)    Albumin 3.4 (*)    Total Bilirubin 1.5 (*)    All other components within normal limits  CBC - Abnormal; Notable for the following components:   RBC 2.22 (*)    Hemoglobin 7.0 (*)    HCT 21.7 (*)    RDW 18.4 (*)    All other components within normal limits  POC OCCULT BLOOD, ED - Abnormal; Notable for the following components:   Fecal Occult Bld POSITIVE (*)    All other components within normal limits  I-STAT CG4 LACTIC ACID, ED - Abnormal; Notable for the following components:   Lactic Acid, Venous 2.32 (*)    All other components within normal limits  LIPASE, BLOOD  PROTIME-INR  CBC  BASIC METABOLIC PANEL  I-STAT TROPONIN, ED  I-STAT CG4 LACTIC ACID, ED  TYPE AND SCREEN  PREPARE RBC (CROSSMATCH)    EKG EKG Interpretation  Date/Time:  Wednesday June 07 2018 17:16:03 EDT Ventricular Rate:  103 PR Interval:    QRS Duration: 92 QT Interval:  356 QTC  Calculation: 466 R Axis:   44 Text Interpretation:  Sinus tachycardia Borderline low voltage, extremity leads When compared to prior, no significant changes seen.  No STEMI Confirmed by Mehmet Scally, Gerald Stabs (  [id number]) on 06/07/2018 5:19:54 PM   Radiology Ct Abdomen Pelvis W Contrast  Result Date: 06/07/2018 CLINICAL DATA:  GI bleed, tarry stools beginning yesterday. History of bladder cancer, hyperplastic colonic polyps, mesenteric spindle cell carcinoma. EXAM: CT ABDOMEN AND PELVIS WITH CONTRAST TECHNIQUE: Multidetector CT imaging of the abdomen and pelvis was performed using the standard protocol following bolus administration of intravenous contrast. CONTRAST:  115mL ISOVUE-300 IOPAMIDOL (ISOVUE-300) INJECTION 61% COMPARISON:  CT abdomen and pelvis May 14, 2018. FINDINGS: LOWER CHEST: Severe centrilobular emphysema. Included heart size is normal. Severe coronary artery calcifications. HEPATOBILIARY: Stable 4.5 cm hypodense mass LEFT lobe of the liver. Mild hepatomegaly. Minimal layering gallbladder sludge versus tiny stones without CT findings of acute cholecystitis. PANCREAS: Mildly atrophic, nonacute. SPLEEN: Normal. ADRENALS/URINARY TRACT: Kidneys are orthotopic, demonstrating symmetric enhancement. 2 mm nonobstructing RIGHT nephrolithiasis. No hydronephrosis or solid renal masses. Stable diffusely hypodense 6.9 cm RIGHT interpolar benign-appearing cyst. Too small to characterize hypodensities LEFT kidneys. The unopacified ureters are normal in course and caliber. Delayed imaging through the kidneys demonstrates symmetric prompt contrast excretion within the proximal urinary collecting system. Urinary bladder is decompressed and unremarkable. Stable 12 mm LEFT adrenal nodule. STOMACH/BOWEL: The stomach, small and large bowel are normal in course and caliber without inflammatory changes, sensitivity decreased without oral contrast. Severe sigmoid colonic diverticulosis. Scattered a additional colonic  diverticula. Normal appendix. VASCULAR/LYMPHATIC: Ectatic infrarenal aorta without aneurysm. Severe calcific atherosclerosis. No lymphadenopathy by CT size criteria. REPRODUCTIVE: Normal. OTHER: At least 25 cm stable lobulated solid mass RIGHT lower quadrant displacing the adjacent bowel. No intraperitoneal free fluid or free air. Granulomas in the pelvis. MUSCULOSKELETAL: Nonacute. Severe L5-S1 disc height loss and endplate spurring resulting in moderate to severe neural foraminal narrowing. IMPRESSION: 1. Colonic diverticulosis without acute diverticulitis nor acute intra-abdominal/pelvic process. 2. Stable at least 25 cm RIGHT lower mesenteric mass previously reported a spindle cell cancer. 3. Stable hepatic and LEFT adrenal masses previously reported is suspected metastasis. 4. 2 mm nonobstructing RIGHT nephrolithiasis. Emphysema (ICD10-J43.9).  Aortic Atherosclerosis (ICD10-I70.0). Electronically Signed   By: Elon Alas M.D.   On: 06/07/2018 18:20    Procedures Procedures (including critical care time)  CRITICAL CARE Performed by: Gwenyth Allegra Tenleigh Byer Total critical care time: 50 minutes Critical care time was exclusive of separately billable procedures and treating other patients. Critical care was necessary to treat or prevent imminent or life-threatening deterioration. Critical care was time spent personally by me on the following activities: development of treatment plan with patient and/or surrogate as well as nursing, discussions with consultants, evaluation of patient's response to treatment, examination of patient, obtaining history from patient or surrogate, ordering and performing treatments and interventions, ordering and review of laboratory studies, ordering and review of radiographic studies, pulse oximetry and re-evaluation of patient's condition.  Medications Ordered in ED Medications  iopamidol (ISOVUE-300) 61 % injection (  Canceled Entry 06/07/18 2310)  vitamin B-12  (CYANOCOBALAMIN) tablet 1,000 mcg (has no administration in time range)  simvastatin (ZOCOR) tablet 20 mg (has no administration in time range)  budesonide (PULMICORT) nebulizer solution 0.25 mg (0.25 mg Nebulization Given 06/07/18 2112)  ALPRAZolam (XANAX) tablet 0.5 mg (0.5 mg Oral Given 06/07/18 2229)  albuterol (PROVENTIL) (2.5 MG/3ML) 0.083% nebulizer solution 2.5 mg (has no administration in time range)  cholecalciferol (VITAMIN D) tablet 400 Units (400 Units Oral Given 06/07/18 2229)  ipratropium-albuterol (DUONEB) 0.5-2.5 (3) MG/3ML nebulizer solution 3 mL (3 mLs Nebulization Given 06/07/18 2110)  methocarbamol (ROBAXIN) tablet 500-1,000 mg (500 mg Oral Given  06/07/18 2301)  multivitamin (PROSIGHT) tablet 1 tablet (1 tablet Oral Given 06/07/18 2229)  pantoprazole (PROTONIX) EC tablet 40 mg (has no administration in time range)  HYDROmorphone (DILAUDID) injection 0.5-1 mg (has no administration in time range)  furosemide (LASIX) tablet 40 mg (has no administration in time range)  potassium gluconate tablet 595 mg (595 mg Oral Given 06/07/18 2229)  acetaminophen (TYLENOL) tablet 650 mg (has no administration in time range)    Or  acetaminophen (TYLENOL) suppository 650 mg (has no administration in time range)  ondansetron (ZOFRAN) tablet 4 mg (has no administration in time range)    Or  ondansetron (ZOFRAN) injection 4 mg (has no administration in time range)  budesonide (PULMICORT) 0.25 MG/2ML nebulizer solution (  Not Given 06/07/18 2113)  guaiFENesin (MUCINEX) 12 hr tablet 1,200 mg (1,200 mg Oral Given 06/07/18 2229)  0.9 %  sodium chloride infusion (Manually program via Guardrails IV Fluids) ( Intravenous New Bag/Given 06/07/18 1942)  iopamidol (ISOVUE-300) 61 % injection 100 mL (100 mLs Intravenous Contrast Given 06/07/18 1753)     Initial Impression / Assessment and Plan / ED Course  I have reviewed the triage vital signs and the nursing notes.  Pertinent labs & imaging results that  were available during my care of the patient were reviewed by me and considered in my medical decision making (see chart for details).     [name] is a 72 y.o. 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  Patient reports that earlier this month he had to be admitted and had 9 units of blood for GI bleeding in the setting of his abdominal tumor.  Patient reports he did well for the last few weeks until yesterday when he had recurrent onset of dark tarry stools and right rectal bleeding.  He has had several episodes yesterday and today.  He reports he went to see his GI team yesterday and they did blood work.  He was called today saying that his hemoglobin was in the sevens and need to come in for transfusion and likely evaluation.  Patient says that he has had extreme fatigue and feels he is getting more pale.  He thinks this is similar to his last visit when he had to be transfused and admitted.  He reports his abdominal pain is both aching and sharp across the lower abdomen.  He describes as 8 out of 10 severity currently.  He denies nausea or vomiting.  He denies fever or chills.  He denies any chest pain or worsening shortness of breath.  He is on his home 6 L.  He denies other trauma or other complaints.  On exam, abdomen is diffusely tender.  Bowel sounds were appreciated.  Rectal exam was performed with gross blood and dark stool.  Lungs were clear and chest was nontender.  Patient will have a type and screen sent.  I am concerned about symptom medic anemia causing his fatigue and abdominal symptoms.  Patient will also have repeat CT imaging given  the large necrotic mass that was found.  Anticipate blood transfusion if hemoglobin is downtrending and patient will likely need admission.  5:18 PM Hemoglobin was found to be  7.0.  This is decreased from yesterday's 7.9 and given the patient's bright red bleeding on rectal exam, I am concerned he is still bleeding.  2 units of blood was ordered and will be given.    Lactic acid is elevated at 2.32.  Initial troponin negative.  Fecal occult test positive.  Lipase not elevated.  INR is 1.1 and not elevated.  CT scan showed persistent large mass but no evidence of acute diverticulitis or perforation.    To the symptom medic anemia and ongoing GI bleed, patient will be admitted to hospitalist service.  Hemoglobin was transfused and low-power will see him in the morning.  Patient admitted for further management.    Final Clinical Impressions(s) / ED Diagnoses   Final diagnoses:  Symptomatic anemia  Rectal bleeding  Acute GI bleeding  Abdominal pain, unspecified abdominal location    ED Discharge Orders    None      Clinical Impression: 1. Symptomatic anemia   2. Rectal bleeding   3. Acute GI bleeding   4. Abdominal pain, unspecified abdominal location     Disposition: Admit  This note was prepared with assistance of Dragon voice recognition software. Occasional wrong-word or sound-a-like substitutions may have occurred due to the inherent limitations of voice recognition software.      Blaise Palladino, Gwenyth Allegra, MD 06/07/18 [phone]

## 2018-06-07 NOTE — ED Triage Notes (Signed)
Pt sent from [facility] for blood transfusion/GI bleeiding. Pt has noticed black stools since yesterday. Pt had Hgb 7.9 from blood work performed yesterday. Pt denies pain.

## 2018-06-07 NOTE — ED Notes (Signed)
ED Provider at bedside. 

## 2018-06-07 NOTE — H&P (Signed)
History and Physical    [name] YJE:[id number] DOB: Feb 13, 1946 DOA: 06/07/2018  PCP: [name], MD  Patient coming from: Home  I have personally briefly reviewed patient's old medical records in [facility]  Chief Complaint: Rectal bleeding  HPI: [name] is a 72 y.o. 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  Patient recently admitted from 6/29 to 7/4 for LGIB, diverticular vs due to the big necrotic mesenteric mass in abdomen.  Required multiple (9 per patient report) units of PRBC transfusion during admission.  Felt not to be great candidate for endoscopy due to severe baseline COPD.  Patient returns to ED with recurrent dark stools becoming BRBPR over the past day or so.  HGB in office yesterday noted to be 7.9 down from 10.8 on 7/4 discharge.   ED Course: HGB 7.0 today in ED.  Has BRBPR on rectal exam, heme positive of course.  Thankfully appears to have stable vitals this time without hypotension (unlike last admit).  2u PRBC transfusion ordered and first unit is going.  CT w contrast of Abd/pelvis again shows large mass, mets, no perforation or acute findings.   Review of Systems: As per HPI otherwise 10 point review of systems negative.   Past Medical History:  Diagnosis Date  . Anxiety   . Bladder cancer ([facility]) 2011  . Borderline diabetes mellitus   . CAD (coronary artery disease)   . Cigarette smoker   . COPD (chronic obstructive pulmonary disease) ([place])   . DJD (degenerative joint disease)   . Emphysema of lung ([facility])   . Epistaxis   . History of colonic polyps 2004   hyperplastic   . History of sinusitis   . Hypercholesteremia   . Hypertension   . Obesity     Past Surgical History:  Procedure Laterality Date  . cataract surgery  septemeber 2013  . cataract surgery/sub posterior vitrectomy in left eye  1996   Dr. Zadie Rhine  . [facility]   [phone]     reports that he quit smoking about 2 years ago. His smoking use included cigarettes. He has a 90.00 pack-year smoking history. He has never used smokeless tobacco. He reports that he drinks alcohol. He reports that he does not use drugs.  Allergies  Allergen Reactions  . Amlodipine Swelling    Family History  Problem Relation Age of Onset  . Heart disease Mother   . Heart disease Brother   . Cancer Brother   . Aneurysm Brother      Prior to Admission medications   Medication Sig Start Date End Date Taking? Authorizing Provider  ALPRAZolam Duanne Moron) 0.5 MG tablet Take 0.5 mg by mouth at bedtime.    Yes [provider]  Ascorbic Acid (VITAMIN C) 1000 MG tablet Take 1,000 mg by mouth every evening.    Yes [provider]  aspirin 81 MG tablet Take 81 mg by mouth at bedtime.    Yes [provider]  budesonide (PULMICORT) 0.25 MG/2ML nebulizer solution Take 2 mLs (0.25 mg total) by nebulization 2 (two) times daily. 01/10/18  Yes Noralee Space, MD  cholecalciferol (VITAMIN D) 400 units TABS tablet Take 400 Units by mouth at bedtime.   Yes [provider]  Flaxseed, Linseed, (FLAX SEED OIL PO) Take 1,400 mg by mouth daily.   Yes [provider]  furosemide (LASIX) 40 MG tablet Take  40 mg by mouth daily.    Yes [provider]  Garlic 7371 MG CAPS Take 1,000 mg by mouth every evening.    Yes [provider]  ipratropium-albuterol (DUONEB) 0.5-2.5 (3) MG/3ML SOLN Take 3 mLs by nebulization 4 (four) times daily. 01/10/18  Yes Noralee Space, MD  isosorbide mononitrate (IMDUR) 30 MG 24 hr tablet Take 1 tablet (30 mg total) by mouth daily. 03/16/16  Yes Weaver, Scott T, PA-C  lisinopril (PRINIVIL,ZESTRIL) 10 MG tablet Take 10 mg by mouth daily. 12/05/16  Yes [provider]  methocarbamol (ROBAXIN) 500 MG tablet TAKE 1 TO 2 TABLETS BY MOUTH TWICE A DAY AS NEEDED FOR MUSCLE CRAMPS 03/07/18  Yes [provider]  multivitamin-lutein (OCUVITE-LUTEIN) CAPS capsule Take 1 capsule by mouth every evening.    Yes [provider]  pantoprazole (PROTONIX) 40 MG tablet Take 1 tablet (40 mg total) by mouth daily. 05/18/18 06/17/18 Yes Shelly Coss, MD  Potassium 99 MG TABS Take 1 tablet by mouth 2 (two) times daily.   Yes [provider]  simvastatin (ZOCOR) 20 MG tablet Take 1 tablet (20 mg total) by mouth daily at 6 PM. 11/19/15  Yes Weaver, Nicki Reaper T, PA-C  vitamin B-12 (CYANOCOBALAMIN) 1000 MCG tablet Take 1,000 mcg by mouth daily.   Yes [provider]  Albuterol Sulfate (PROAIR RESPICLICK) 062 (90 Base) MCG/ACT AEPB Inhale 2 puffs into the lungs every 6 (six) hours as needed (shortness of breath).    [provider]  nitroGLYCERIN (NITROSTAT) 0.4 MG SL tablet Place 1 tablet (0.4 mg total) under the tongue every 5 (five) minutes as needed for chest pain. 11/19/15   Richardson Dopp T, PA-C  Polyethyl Glycol-Propyl Glycol (SYSTANE OP) Apply 1 drop to eye daily as needed (dry eyes).    [provider]  sodium chloride (OCEAN) 0.65 % SOLN nasal spray Place 1 spray into both nostrils as needed for congestion. 10/29/15   Cherene Altes, MD    Physical Exam: Vitals:   06/07/18 1930 06/07/18 1932 06/07/18 1933 06/07/18 1952  BP: 131/69 131/69 131/69 134/74  Pulse: 96 93 93 98  Resp: (!) 26 (!) 21 15 (!) 22  Temp:  98.6 F (37 C) 98.6 F (37 C) 98.8 F (37.1 C)  TempSrc:  Oral Oral Oral  SpO2: 100% 100%      Constitutional: NAD, calm, comfortable Eyes: PERRL, lids and conjunctivae normal ENMT: Mucous membranes are moist. Posterior pharynx clear of any exudate or lesions.Normal dentition.  Neck: normal, supple, no masses, no thyromegaly Respiratory: clear to auscultation bilaterally, no wheezing, no crackles. Normal respiratory effort. No accessory muscle use.  Cardiovascular: Regular rate and rhythm, no murmurs / rubs / gallops. No extremity edema. 2+  pedal pulses. No carotid bruits.  Abdomen: lower abd TTP, no rebound, no guarding. Musculoskeletal: no clubbing / cyanosis. No joint deformity upper and lower extremities. Good ROM, no contractures. Normal muscle tone.  Skin: no rashes, lesions, ulcers. No induration Neurologic: CN 2-12 grossly intact. Sensation intact, DTR normal. Strength 5/5 in all 4.  Psychiatric: Normal judgment and insight. Alert and oriented x 3. Normal mood.    Labs on Admission: I have personally reviewed following labs and imaging studies  CBC: Recent Labs  Lab 06/06/18 1407 06/07/18 1631  WBC 5.2 7.0  NEUTROABS 4.3  --   HGB 7.9 Repeated and verified X2.* 7.0*  HCT 22.9 Repeated and verified X2.* 21.7*  MCV 93.3 97.7  PLT 192.0 193  Basic Metabolic Panel: Recent Labs  Lab 06/07/18 1631  NA 140  K 4.3  CL 106  CO2 25  GLUCOSE 213*  BUN 25*  CREATININE 0.90  CALCIUM 8.7*   GFR: CrCl cannot be calculated (Unknown ideal weight.). Liver Function Tests: Recent Labs  Lab 06/07/18 1631  AST 34  ALT 37  ALKPHOS 42  BILITOT 1.5*  PROT 6.2*  ALBUMIN 3.4*   Recent Labs  Lab 06/07/18 1644  LIPASE 29   No results for input(s): AMMONIA in the last 168 hours. Coagulation Profile: Recent Labs  Lab 06/07/18 1644  INR 1.10   Cardiac Enzymes: No results for input(s): CKTOTAL, CKMB, CKMBINDEX, TROPONINI in the last 168 hours. BNP (last 3 results) No results for input(s): PROBNP in the last 8760 hours. HbA1C: No results for input(s): HGBA1C in the last 72 hours. CBG: No results for input(s): GLUCAP in the last 168 hours. Lipid Profile: No results for input(s): CHOL, HDL, LDLCALC, TRIG, CHOLHDL, LDLDIRECT in the last 72 hours. Thyroid Function Tests: No results for input(s): TSH, T4TOTAL, FREET4, T3FREE, THYROIDAB in the last 72 hours. Anemia Panel: No results for input(s): VITAMINB12, FOLATE, FERRITIN, TIBC, IRON, RETICCTPCT in the last 72 hours. Urine analysis:    Component Value  Date/Time   COLORURINE YELLOW 01/19/2017 1739   APPEARANCEUR CLEAR 01/19/2017 1739   LABSPEC 1.023 01/19/2017 1739   PHURINE 5.0 01/19/2017 1739   GLUCOSEU NEGATIVE 01/19/2017 1739   GLUCOSEU NEGATIVE 06/30/2010 1050   HGBUR NEGATIVE 01/19/2017 1739   BILIRUBINUR NEGATIVE 01/19/2017 1739   KETONESUR 5 (A) 01/19/2017 1739   PROTEINUR 30 (A) 01/19/2017 1739   UROBILINOGEN 0.2 06/30/2010 1050   NITRITE NEGATIVE 01/19/2017 1739   LEUKOCYTESUR NEGATIVE 01/19/2017 1739    Radiological Exams on Admission: Ct Abdomen Pelvis W Contrast  Result Date: 06/07/2018 CLINICAL DATA:  GI bleed, tarry stools beginning yesterday. History of bladder cancer, hyperplastic colonic polyps, mesenteric spindle cell carcinoma. EXAM: CT ABDOMEN AND PELVIS WITH CONTRAST TECHNIQUE: Multidetector CT imaging of the abdomen and pelvis was performed using the standard protocol following bolus administration of intravenous contrast. CONTRAST:  161mL ISOVUE-300 IOPAMIDOL (ISOVUE-300) INJECTION 61% COMPARISON:  CT abdomen and pelvis May 14, 2018. FINDINGS: LOWER CHEST: Severe centrilobular emphysema. Included heart size is normal. Severe coronary artery calcifications. HEPATOBILIARY: Stable 4.5 cm hypodense mass LEFT lobe of the liver. Mild hepatomegaly. Minimal layering gallbladder sludge versus tiny stones without CT findings of acute cholecystitis. PANCREAS: Mildly atrophic, nonacute. SPLEEN: Normal. ADRENALS/URINARY TRACT: Kidneys are orthotopic, demonstrating symmetric enhancement. 2 mm nonobstructing RIGHT nephrolithiasis. No hydronephrosis or solid renal masses. Stable diffusely hypodense 6.9 cm RIGHT interpolar benign-appearing cyst. Too small to characterize hypodensities LEFT kidneys. The unopacified ureters are normal in course and caliber. Delayed imaging through the kidneys demonstrates symmetric prompt contrast excretion within the proximal urinary collecting system. Urinary bladder is decompressed and unremarkable.  Stable 12 mm LEFT adrenal nodule. STOMACH/BOWEL: The stomach, small and large bowel are normal in course and caliber without inflammatory changes, sensitivity decreased without oral contrast. Severe sigmoid colonic diverticulosis. Scattered a additional colonic diverticula. Normal appendix. VASCULAR/LYMPHATIC: Ectatic infrarenal aorta without aneurysm. Severe calcific atherosclerosis. No lymphadenopathy by CT size criteria. REPRODUCTIVE: Normal. OTHER: At least 25 cm stable lobulated solid mass RIGHT lower quadrant displacing the adjacent bowel. No intraperitoneal free fluid or free air. Granulomas in the pelvis. MUSCULOSKELETAL: Nonacute. Severe L5-S1 disc height loss and endplate spurring resulting in moderate to severe neural foraminal narrowing. IMPRESSION: 1. Colonic diverticulosis without acute diverticulitis nor  acute intra-abdominal/pelvic process. 2. Stable at least 25 cm RIGHT lower mesenteric mass previously reported a spindle cell cancer. 3. Stable hepatic and LEFT adrenal masses previously reported is suspected metastasis. 4. 2 mm nonobstructing RIGHT nephrolithiasis. Emphysema (ICD10-J43.9).  Aortic Atherosclerosis (ICD10-I70.0). Electronically Signed   By: Elon Alas M.D.   On: 06/07/2018 18:20    EKG: Independently reviewed.  Assessment/Plan Principal Problem:   Lower GI bleed Active Problems:   Essential hypertension   COPD mixed type (HCC)   BiPAP (biphasic positive airway pressure) dependence   Spindle cell sarcoma (HCC)   Acute blood loss anemia    1. LGIB - 1. GI to see tomorrow 2. If persists then next step would be CT angio of abd/pelvis and angiography for embolization.  Wont order for tonight though because he has already had contrast load tonight. 3. Tele monitor 2. Acute blood loss anemia - 1. 2u PRBC transfusion 2. Repeat CBC in AM 3. HTN - 1. Hold lisinopril 2. Hold imdur 3. But will plan to continue lasix tomorrow morning for the moment since vitals  stable, SBP 130s, etc, and transfusing blood. 4. COPD - 1. 6L baseline 2. BIPAP at night 3. Cont home neb treatments 4. Adult wheeze protocol 5. Spindle cell sarcoma - 1. Oncology care is at [facility]  DVT prophylaxis: SCDs Code Status: Full - will put in for pal care consult to get these discussions started. Family Communication: No family in room Disposition Plan: Home after admit Consults called: Lb GI will see tomorrow, put pal care consult into Epic. Admission status: Admit to inpatient   [place] [place] [facility] Pager [phone] Only works nights!  If 7AM-7PM, please contact the primary day team physician taking care of patient  [email or web address] Password [facility]  06/07/2018, 8:32 PM

## 2018-06-08 ENCOUNTER — Other Ambulatory Visit: Payer: Self-pay

## 2018-06-08 DIAGNOSIS — Z9989 Dependence on other enabling machines and devices: Secondary | ICD-10-CM

## 2018-06-08 DIAGNOSIS — I10 Essential (primary) hypertension: Secondary | ICD-10-CM

## 2018-06-08 DIAGNOSIS — C499 Malignant neoplasm of connective and soft tissue, unspecified: Secondary | ICD-10-CM

## 2018-06-08 DIAGNOSIS — D62 Acute posthemorrhagic anemia: Secondary | ICD-10-CM

## 2018-06-08 DIAGNOSIS — J449 Chronic obstructive pulmonary disease, unspecified: Secondary | ICD-10-CM

## 2018-06-08 LAB — BASIC METABOLIC PANEL
Anion gap: 5 (ref 5–15)
BUN: 21 mg/dL (ref 8–23)
CHLORIDE: 106 mmol/L (ref 98–111)
CO2: 28 mmol/L (ref 22–32)
CREATININE: 0.8 mg/dL (ref 0.61–1.24)
Calcium: 8.2 mg/dL — ABNORMAL LOW (ref 8.9–10.3)
GFR calc Af Amer: >60 mL/min (ref >60)
GFR calc non Af Amer: >60 mL/min (ref >60)
Glucose, Bld: 124 mg/dL — ABNORMAL HIGH (ref 70–99)
POTASSIUM: 4.3 mmol/L (ref 3.5–5.1)
Sodium: 139 mmol/L (ref 135–145)

## 2018-06-08 LAB — HEMOGLOBIN AND HEMATOCRIT, BLOOD
HCT: 28.8 % — ABNORMAL LOW (ref 39.0–52.0)
Hemoglobin: 9.7 g/dL — ABNORMAL LOW (ref 13.0–17.0)

## 2018-06-08 LAB — PREPARE RBC (CROSSMATCH)

## 2018-06-08 LAB — CBC
HEMATOCRIT: 23.8 % — AB (ref 39.0–52.0)
HEMOGLOBIN: 7.8 g/dL — AB (ref 13.0–17.0)
MCH: 30.8 pg (ref 26.0–34.0)
MCHC: 32.8 g/dL (ref 30.0–36.0)
MCV: 94.1 fL (ref 78.0–100.0)
Platelets: 150 10*3/uL (ref 150–400)
RBC: 2.53 MIL/uL — ABNORMAL LOW (ref 4.22–5.81)
RDW: 17.3 % — ABNORMAL HIGH (ref 11.5–15.5)
WBC: 5 10*3/uL (ref 4.0–10.5)

## 2018-06-08 MED ORDER — SODIUM CHLORIDE 0.9% IV SOLUTION
Freq: Once | INTRAVENOUS | Status: AC
  Administered 2018-06-08: 13:00:00 via INTRAVENOUS

## 2018-06-08 NOTE — Consult Note (Signed)
Consultation Note Date: 06/08/2018   Patient Name: [name]  DOB: [birth year]  MRN: [id number]  Age / Sex: 72 y.o., male  PCP: [name], MD Referring Physician: Mariel Aloe, MD  Reason for Consultation: Establishing goals of care  HPI/Patient Profile: 72 y.o. male admitted on 06/07/2018  Clinical Assessment and Goals of Care:  72 year old gentleman who lives in [place], [place] with his wife. Patient reportedly has a history of having a abdominal mesenteric mass for which he was undergoing annual CT evaluations. Patient was recently hospitalized for GI bleed. Additional imaging at that time showed 25 cm right lower mesenteric mass, evidence of hepatic and left adrenal masses. Patient has a pending follow-up appointment with oncology of a [facility] scheduled for early August 2019. Patient has been admitted with lower GI bleed deemed likely secondary to diverticulosis versus his sarcoma. Patient has required blood transfusion. Patient's medical condition also complicated by the fact that he has COPD he is on 6 L of oxygen at baseline and also requires BiPAP at night. Because of his tenuous respiratory status, sedation/anesthesia, colonoscopy or any surgical procedure has been deemed too risky to undertake.  A palliative consult has been requested for goals of care discussions.  Patient is resting by the edge of his bed. His wife is also present at the bedside. I introduced myself and palliative care as follows: Palliative medicine is specialized medical care for people living with serious illness. It focuses on providing relief from the symptoms and stress of a serious illness. The goal is to improve quality of life for both the patient and the family.  Brief life review performed. Discussed with the patient and his wife was also present at the bedside in  detail. Patient states that overall he is very scared and concerned about his health. He wishes for ongoing stabilization/recovery. He is awaiting further discussions with GI as well as oncology. He understands he is high risk for procedures or surgeries.  See additional discussions/recommendations below. Thank you for the consult.  HCPOA  wife  Patient [place] in [place] [employer] with his wife.   SUMMARY OF RECOMMENDATIONS    full code/ full scope for now. Appreciate gastroenterology input and recommendations. Will follow hospital course for additional discussions regarding goals of care and scope of treatment. Consider inpatient medical oncology input while the patient is here at the hospital.  Code Status/Advance Care Planning:  Full code    Symptom Management:    continue current mode of care.   Palliative Prophylaxis:   Delirium Protocol  Additional Recommendations (Limitations, Scope, Preferences):  Full Scope Treatment  Psycho-social/Spiritual:   Desire for further Chaplaincy support:yes  Additional Recommendations: Caregiving  Support/Resources  Prognosis:   Unable to determine  Discharge Planning: To Be Determined      Primary Diagnoses: Present on Admission: . COPD mixed type ([place]) . Lower GI bleed . Acute blood loss anemia . Spindle cell sarcoma ([place]) . Essential hypertension   I have reviewed the medical  record, interviewed the patient and family, and examined the patient. The following aspects are pertinent.  Past Medical History:  Diagnosis Date  . Anxiety   . Bladder cancer ([place]) 2011  . Borderline diabetes mellitus   . CAD (coronary artery disease)   . Cigarette smoker   . COPD (chronic obstructive pulmonary disease) ([place])   . DJD (degenerative joint disease)   . Emphysema of lung ([place])   . Epistaxis   . History of colonic polyps 2004   hyperplastic   . History of sinusitis   . Hypercholesteremia   . Hypertension   . Obesity     Social History   Socioeconomic History  . Marital status: Married    Spouse name: [name]  . Number of children: 2  . Years of education: Not on file  . Highest education level: Not on file  Occupational History  . Occupation: Retired  Scientific laboratory technician  . Financial resource strain: Not on file  . Food insecurity:    Worry: Not on file    Inability: Not on file  . Transportation needs:    Medical: Not on file    Non-medical: Not on file  Tobacco Use  . Smoking status: Former Smoker    Packs/day: 2.00    Years: 45.00    Pack years: 90.00    Types: Cigarettes    Last attempt to quit: 10/17/2015    Years since quitting: 2.6  . Smokeless tobacco: Never Used  Substance and Sexual Activity  . Alcohol use: Yes    Alcohol/week: 0.0 oz    Comment: glass of wine at night  . Drug use: No  . Sexual activity: Not on file  Lifestyle  . Physical activity:    Days per week: Not on file    Minutes per session: Not on file  . Stress: Not on file  Relationships  . Social connections:    Talks on phone: Not on file    Gets together: Not on file    Attends religious service: Not on file    Active member of club or organization: Not on file    Attends meetings of clubs or organizations: Not on file    Relationship status: Not on file  Other Topics Concern  . Not on file  Social History Narrative  . Not on file   Family History  Problem Relation Age of Onset  . Heart disease Mother   . Heart disease Brother   . Cancer Brother   . Aneurysm Brother    Scheduled Meds: . ALPRAZolam  0.5 mg Oral QHS  . budesonide  0.25 mg Nebulization BID  . cholecalciferol  400 Units Oral QHS  . furosemide  40 mg Oral Daily  . guaiFENesin  1,200 mg Oral BID  . ipratropium-albuterol  3 mL Nebulization QID  . multivitamin  1 tablet Oral QPM  . pantoprazole  40 mg Oral Daily  . potassium gluconate  595 mg Oral BID  . simvastatin  20 mg Oral q1800  . vitamin B-12  1,000 mcg Oral Daily   Continuous  Infusions: PRN Meds:.acetaminophen **OR** acetaminophen, albuterol, HYDROmorphone (DILAUDID) injection, methocarbamol, ondansetron **OR** ondansetron (ZOFRAN) IV Medications Prior to Admission:  Prior to Admission medications   Medication Sig Start Date End Date Taking? Authorizing Provider  ALPRAZolam Duanne Moron) 0.5 MG tablet Take 0.5 mg by mouth at bedtime.    Yes [provider]  Ascorbic Acid (VITAMIN C) 1000 MG tablet Take 1,000 mg by mouth every  evening.    Yes [provider]  aspirin 81 MG tablet Take 81 mg by mouth at bedtime.    Yes [provider]  budesonide (PULMICORT) 0.25 MG/2ML nebulizer solution Take 2 mLs (0.25 mg total) by nebulization 2 (two) times daily. 01/10/18  Yes Noralee Space, MD  cholecalciferol (VITAMIN D) 400 units TABS tablet Take 400 Units by mouth at bedtime.   Yes [provider]  Flaxseed, Linseed, (FLAX SEED OIL PO) Take 1,400 mg by mouth daily.   Yes [provider]  furosemide (LASIX) 40 MG tablet Take 40 mg by mouth daily.    Yes [provider]  Garlic 2119 MG CAPS Take 1,000 mg by mouth every evening.    Yes [provider]  ipratropium-albuterol (DUONEB) 0.5-2.5 (3) MG/3ML SOLN Take 3 mLs by nebulization 4 (four) times daily. 01/10/18  Yes Noralee Space, MD  isosorbide mononitrate (IMDUR) 30 MG 24 hr tablet Take 1 tablet (30 mg total) by mouth daily. 03/16/16  Yes Weaver, Scott T, PA-C  lisinopril (PRINIVIL,ZESTRIL) 10 MG tablet Take 10 mg by mouth daily. 12/05/16  Yes [provider]  methocarbamol (ROBAXIN) 500 MG tablet TAKE 1 TO 2 TABLETS BY MOUTH TWICE A DAY AS NEEDED FOR MUSCLE CRAMPS 03/07/18  Yes [provider]  multivitamin-lutein (OCUVITE-LUTEIN) CAPS capsule Take 1 capsule by mouth every evening.    Yes [provider]  pantoprazole (PROTONIX) 40 MG tablet Take 1 tablet (40 mg total) by mouth daily. 05/18/18 06/17/18 Yes Shelly Coss, MD  Potassium 99 MG TABS Take  1 tablet by mouth 2 (two) times daily.   Yes [provider]  simvastatin (ZOCOR) 20 MG tablet Take 1 tablet (20 mg total) by mouth daily at 6 PM. 11/19/15  Yes Weaver, Nicki Reaper T, PA-C  vitamin B-12 (CYANOCOBALAMIN) 1000 MCG tablet Take 1,000 mcg by mouth daily.   Yes [provider]  Albuterol Sulfate (PROAIR RESPICLICK) 417 (90 Base) MCG/ACT AEPB Inhale 2 puffs into the lungs every 6 (six) hours as needed (shortness of breath).    [provider]  nitroGLYCERIN (NITROSTAT) 0.4 MG SL tablet Place 1 tablet (0.4 mg total) under the tongue every 5 (five) minutes as needed for chest pain. 11/19/15   Richardson Dopp T, PA-C  Polyethyl Glycol-Propyl Glycol (SYSTANE OP) Apply 1 drop to eye daily as needed (dry eyes).    [provider]  sodium chloride (OCEAN) 0.65 % SOLN nasal spray Place 1 spray into both nostrils as needed for congestion. 10/29/15   Cherene Altes, MD   Allergies  Allergen Reactions  . Amlodipine Swelling   Review of Systems abd pain Shortness of breath  Physical Exam Age-appropriate appearing gentleman sitting by the edge of his bed S1-S2 Respirations clear Patient is on oxygen at 6 L nasal cannula Abdomen is distended Patient has bilateral lower quadrant tenderness Patient does not have edema Patient is awake alert oriented At times, patient has shortness of breath, pursed lip breathing, unable to take deep breaths  Vital Signs: BP 132/63   Pulse 88   Temp 98.7 F (37.1 C) (Oral)   Resp 20   Ht 5\' 6"  (1.676 m)   Wt 103.5 kg (228 lb 1.6 oz)   SpO2 100%   BMI 36.82 kg/m  Pain Scale: 0-10   Pain Score: 0-No pain   SpO2: SpO2: 100 % O2 Device:SpO2: 100 % O2 Flow Rate: .O2 Flow Rate (L/min): 5 L/min  IO: Intake/output summary:  Intake/Output Summary (Last 24 hours) at 06/08/2018 1425 Last data filed at 06/08/2018 1030 Gross per 24 hour  Intake 1769.08 ml  Output 450 ml  Net 1319.08 ml    LBM: Last BM Date:  06/07/18 Baseline Weight: Weight: 103.5 kg (228 lb 1.6 oz) Most recent weight: Weight: 103.5 kg (228 lb 1.6 oz)     Palliative Assessment/Data:   PPS 40%  Time In: 1300 Time Out:1400 Time Total: 60 min  Greater than 50%  of this time was spent counseling and coordinating care related to the above assessment and plan.  Signed by: Loistine Chance, MD  [phone]  Please contact Palliative Medicine Team phone at [phone] for questions and concerns.  For individual provider: See Shea Evans

## 2018-06-08 NOTE — Plan of Care (Signed)
  Problem: Clinical Measurements: Goal: Diagnostic test results will improve Outcome: Progressing Goal: Respiratory complications will improve Outcome: Progressing   Problem: Nutrition: Goal: Adequate nutrition will be maintained Outcome: Progressing   Problem: Coping: Goal: Level of anxiety will decrease Outcome: Progressing   Problem: Elimination: Goal: Will not experience complications related to bowel motility Outcome: Progressing Goal: Will not experience complications related to urinary retention Outcome: Progressing   Problem: Education: Goal: Knowledge of General Education information will improve Description Including pain rating scale, medication(s)/side effects and non-pharmacologic comfort measures Outcome: Adequate for Discharge   Problem: Health Behavior/Discharge Planning: Goal: Ability to manage health-related needs will improve Outcome: Adequate for Discharge   Problem: Clinical Measurements: Goal: Ability to maintain clinical measurements within normal limits will improve Outcome: Adequate for Discharge Goal: Will remain free from infection Outcome: Adequate for Discharge Goal: Cardiovascular complication will be avoided Outcome: Adequate for Discharge   Problem: Activity: Goal: Risk for activity intolerance will decrease Outcome: Adequate for Discharge   Problem: Pain Managment: Goal: General experience of comfort will improve Outcome: Adequate for Discharge   Problem: Safety: Goal: Ability to remain free from injury will improve Outcome: Adequate for Discharge   Problem: Skin Integrity: Goal: Risk for impaired skin integrity will decrease Outcome: Adequate for Discharge

## 2018-06-08 NOTE — Progress Notes (Signed)
PROGRESS NOTE    LOVE MILBOURNE  [id number]:[id number] DOB: 08-03-1946 DOA: 06/07/2018 PCP: [name], MD   Brief Narrative: [name] is a 72 y.o. 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 Patient presented secondary to bloody stools likely with a diverticular bleed.     Assessment & Plan:   Principal Problem:   Lower GI bleed Active Problems:   Essential hypertension   COPD mixed type (HCC)   BiPAP (biphasic positive airway pressure) dependence   Spindle cell sarcoma (HCC)   Acute blood loss anemia   GI bleeding Likely diverticular however patient does take ibuprofen as an outpatient. Continuing to bleed. Has required 5 units of PRBC to date. Hemoglobin down to 6.4 today down from 8.6 last night. Transfusing 2 units of PRBC -Continue repeat CBCs -Transfuse for hemoglobin less than 8 -GI recommendations: colonoscopy planned  Chronic diastolic heart failure Asymptomatic. Lisinopril, lasix held  OSA Bipap  Spindle cell sarcoma Stable. Outpatient follow-up.  COPD Chronic respiratory failure with hypoxia and hypercapnia On chronic oxygen. Stable. 6L at baseline. -Continue oxygen therapy  CAD s/p stents. Lisinopril, Imdur held secondary to acute GI bleeding -Continue Simvastatin  Hyperkalemia Mild. Resoled.   DVT prophylaxis: SCDs Code Status:   Code Status: Full Code Family Communication: Wife at bedside Disposition Plan: Discharge pending resolution of GI bleeding and stabilization of hemoglobin    Consultants:   [facility]  Procedures:   6/29: 2 units PRBC  6/30: 2 units PRBC  7/1: 1 unit PRBC  7/2: 2 units PRBC  Antimicrobials:  None    Subjective: Significant bleeding overnight. Some dyspnea today.  Objective: Vitals:   06/08/18 0751 06/08/18 0850 06/08/18 1312 06/08/18 1331  BP:   120/61 132/63  Pulse:   87 88  Resp:   20 20  Temp:    (!) 97.3 F (36.3 C) 98.7 F (37.1 C)  TempSrc:   Axillary Oral  SpO2: 98% 99% 100% 100%  Weight:      Height:        Intake/Output Summary (Last 24 hours) at 06/08/2018 1452 Last data filed at 06/08/2018 1030 Gross per 24 hour  Intake 1769.08 ml  Output 450 ml  Net 1319.08 ml   Filed Weights   06/07/18 2200  Weight: 103.5 kg (228 lb 1.6 oz)    Examination:  General exam: Appears calm and comfortable Respiratory system: Clear to auscultation. Pursed lip breathing with increased breaths between sentences Cardiovascular system: S1 & S2 heard, RRR. No murmurs, rubs, gallops or clicks. Gastrointestinal system: Abdomen is nondistended, soft and non-tender. RLQ masses felt. Normal bowel sounds heard. Central nervous system: Alert and oriented. No focal neurological deficits. Extremities: No edema. No calf tenderness Skin: No cyanosis. No rashes Psychiatry: Judgement and insight appear normal. Mood & affect appropriate.     Data Reviewed: I have personally reviewed following labs and imaging studies  CBC: Recent Labs  Lab 06/06/18 1407 06/07/18 1631 06/08/18 0449  WBC 5.2 7.0 5.0  NEUTROABS 4.3  --   --   HGB 7.9 Repeated and verified X2.* 7.0* 7.8*  HCT 22.9 Repeated and verified X2.* 21.7* 23.8*  MCV 93.3 97.7 94.1  PLT 192.0 193 448   Basic Metabolic Panel: Recent Labs  Lab 06/07/18 1631 06/08/18 0449  NA 140 139  K 4.3 4.3  CL 106 106  CO2 25 28  GLUCOSE 213* 124*  BUN  25* 21  CREATININE 0.90 0.80  CALCIUM 8.7* 8.2*   GFR: Estimated Creatinine Clearance: 92.7 mL/min (by C-G formula based on SCr of 0.8 mg/dL). Liver Function Tests: Recent Labs  Lab 06/07/18 1631  AST 34  ALT 37  ALKPHOS 42  BILITOT 1.5*  PROT 6.2*  ALBUMIN 3.4*   Recent Labs  Lab 06/07/18 1644  LIPASE 29   No results for input(s): AMMONIA in the last 168 hours. Coagulation Profile: Recent Labs  Lab 06/07/18 1644  INR 1.10   Cardiac Enzymes: No results for input(s):  CKTOTAL, CKMB, CKMBINDEX, TROPONINI in the last 168 hours. BNP (last 3 results) No results for input(s): PROBNP in the last 8760 hours. HbA1C: No results for input(s): HGBA1C in the last 72 hours. CBG: No results for input(s): GLUCAP in the last 168 hours. Lipid Profile: No results for input(s): CHOL, HDL, LDLCALC, TRIG, CHOLHDL, LDLDIRECT in the last 72 hours. Thyroid Function Tests: No results for input(s): TSH, T4TOTAL, FREET4, T3FREE, THYROIDAB in the last 72 hours. Anemia Panel: No results for input(s): VITAMINB12, FOLATE, FERRITIN, TIBC, IRON, RETICCTPCT in the last 72 hours. Sepsis Labs: Recent Labs  Lab 06/07/18 1655  LATICACIDVEN 2.32*    No results found for this or any previous visit (from the past 240 hour(s)).       Radiology Studies: Ct Abdomen Pelvis W Contrast  Result Date: 06/07/2018 CLINICAL DATA:  GI bleed, tarry stools beginning yesterday. History of bladder cancer, hyperplastic colonic polyps, mesenteric spindle cell carcinoma. EXAM: CT ABDOMEN AND PELVIS WITH CONTRAST TECHNIQUE: Multidetector CT imaging of the abdomen and pelvis was performed using the standard protocol following bolus administration of intravenous contrast. CONTRAST:  162mL ISOVUE-300 IOPAMIDOL (ISOVUE-300) INJECTION 61% COMPARISON:  CT abdomen and pelvis May 14, 2018. FINDINGS: LOWER CHEST: Severe centrilobular emphysema. Included heart size is normal. Severe coronary artery calcifications. HEPATOBILIARY: Stable 4.5 cm hypodense mass LEFT lobe of the liver. Mild hepatomegaly. Minimal layering gallbladder sludge versus tiny stones without CT findings of acute cholecystitis. PANCREAS: Mildly atrophic, nonacute. SPLEEN: Normal. ADRENALS/URINARY TRACT: Kidneys are orthotopic, demonstrating symmetric enhancement. 2 mm nonobstructing RIGHT nephrolithiasis. No hydronephrosis or solid renal masses. Stable diffusely hypodense 6.9 cm RIGHT interpolar benign-appearing cyst. Too small to characterize  hypodensities LEFT kidneys. The unopacified ureters are normal in course and caliber. Delayed imaging through the kidneys demonstrates symmetric prompt contrast excretion within the proximal urinary collecting system. Urinary bladder is decompressed and unremarkable. Stable 12 mm LEFT adrenal nodule. STOMACH/BOWEL: The stomach, small and large bowel are normal in course and caliber without inflammatory changes, sensitivity decreased without oral contrast. Severe sigmoid colonic diverticulosis. Scattered a additional colonic diverticula. Normal appendix. VASCULAR/LYMPHATIC: Ectatic infrarenal aorta without aneurysm. Severe calcific atherosclerosis. No lymphadenopathy by CT size criteria. REPRODUCTIVE: Normal. OTHER: At least 25 cm stable lobulated solid mass RIGHT lower quadrant displacing the adjacent bowel. No intraperitoneal free fluid or free air. Granulomas in the pelvis. MUSCULOSKELETAL: Nonacute. Severe L5-S1 disc height loss and endplate spurring resulting in moderate to severe neural foraminal narrowing. IMPRESSION: 1. Colonic diverticulosis without acute diverticulitis nor acute intra-abdominal/pelvic process. 2. Stable at least 25 cm RIGHT lower mesenteric mass previously reported a spindle cell cancer. 3. Stable hepatic and LEFT adrenal masses previously reported is suspected metastasis. 4. 2 mm nonobstructing RIGHT nephrolithiasis. Emphysema (ICD10-J43.9).  Aortic Atherosclerosis (ICD10-I70.0). Electronically Signed   By: Elon Alas M.D.   On: 06/07/2018 18:20        Scheduled Meds: . ALPRAZolam  0.5 mg Oral QHS  .  budesonide  0.25 mg Nebulization BID  . cholecalciferol  400 Units Oral QHS  . furosemide  40 mg Oral Daily  . guaiFENesin  1,200 mg Oral BID  . ipratropium-albuterol  3 mL Nebulization QID  . multivitamin  1 tablet Oral QPM  . pantoprazole  40 mg Oral Daily  . potassium gluconate  595 mg Oral BID  . simvastatin  20 mg Oral q1800  . vitamin B-12  1,000 mcg Oral Daily     Continuous Infusions:   LOS: 1 day     Cordelia Poche, MD [facility] 06/08/2018, 2:52 PM Pager: [phone]  If 7PM-7AM, please contact night-coverage [email or web address] 06/08/2018, 2:52 PM  PROGRESS NOTE    EXODUS KUTZER  KPT:[id number] DOB: 1945/12/09 DOA: 06/07/2018 PCP: [name], MD   Brief Narrative: [name] is a 72 y.o. 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 He presented secondary to melena and anemia. He has been transfused 2 units of PRBC.   Assessment & Plan:   Principal Problem:   Lower GI bleed Active Problems:   Essential hypertension   COPD mixed type (HCC)   BiPAP (biphasic positive airway pressure) dependence   Spindle cell sarcoma (HCC)   Acute blood loss anemia   GI bleeding Bright red blood per rectum. Presumed lower GI bleed. Recurrent issue. -GI recommendations: if bleeding, plan on CT angiography; another 2 units of PRBC  Acute blood loss anemia Secondary to GI bleeding. Received 2 units of PRBC  Essential hypertension Holding lisinopril and Imdur.  COPD Chronic respiratory failure with hypoxia Baseline. Bipap at night -Continue Duoneb  Spindle cell sarcoma Stable.   DVT prophylaxis: SCDs Code Status:   Code Status: Full Code Family Communication: Wife at bedside Disposition Plan: Discharge hopefully in 24-48 hours if (a) bleeding stops/stablizes or (b) source of bleeding is identified and treated   Consultants:   Gastroenterology  Procedures:   7/24: 2 units of PRBC  Antimicrobials:  None    Subjective: Melena overnight.  Objective: Vitals:   06/07/18 2334 06/08/18 0148 06/08/18 0630 06/08/18 0751  BP:  116/68 132/77   Pulse: 90 95 84   Resp: 20 20 16    Temp:  97.9 F (36.6 C) 97.8 F (36.6 C)   TempSrc:  Axillary Oral   SpO2: 98% 100% 99% 98%  Weight:      Height:        Intake/Output Summary (Last  24 hours) at 06/08/2018 0831 Last data filed at 06/08/2018 1700 Gross per 24 hour  Intake 1307.08 ml  Output 450 ml  Net 857.08 ml   Filed Weights   06/07/18 2200  Weight: 103.5 kg (228 lb 1.6 oz)    Examination:  General exam: Appears calm and comfortable Respiratory system: Clear to auscultation. Respiratory effort normal. Cardiovascular system: S1 & S2 heard, RRR. No murmurs, rubs, gallops or clicks. Gastrointestinal system: Abdomen is nondistended, soft and nontender. Firm mass of upper abdomen. Normal bowel sounds heard. Central nervous system: Alert and oriented. No focal neurological deficits. Extremities: 1+ edema. No calf tenderness Skin: No cyanosis. No rashes Psychiatry: Judgement and insight appear normal. Mood & affect appropriate.     Data Reviewed: I have personally reviewed following labs and imaging studies  CBC: Recent Labs  Lab 06/06/18 1407 06/07/18 1631 06/08/18 0449  WBC 5.2 7.0 5.0  NEUTROABS 4.3  --   --   HGB  7.9 Repeated and verified X2.* 7.0* 7.8*  HCT 22.9 Repeated and verified X2.* 21.7* 23.8*  MCV 93.3 97.7 94.1  PLT 192.0 193 161   Basic Metabolic Panel: Recent Labs  Lab 06/07/18 1631 06/08/18 0449  NA 140 139  K 4.3 4.3  CL 106 106  CO2 25 28  GLUCOSE 213* 124*  BUN 25* 21  CREATININE 0.90 0.80  CALCIUM 8.7* 8.2*   GFR: Estimated Creatinine Clearance: 92.7 mL/min (by C-G formula based on SCr of 0.8 mg/dL). Liver Function Tests: Recent Labs  Lab 06/07/18 1631  AST 34  ALT 37  ALKPHOS 42  BILITOT 1.5*  PROT 6.2*  ALBUMIN 3.4*   Recent Labs  Lab 06/07/18 1644  LIPASE 29   No results for input(s): AMMONIA in the last 168 hours. Coagulation Profile: Recent Labs  Lab 06/07/18 1644  INR 1.10   Cardiac Enzymes: No results for input(s): CKTOTAL, CKMB, CKMBINDEX, TROPONINI in the last 168 hours. BNP (last 3 results) No results for input(s): PROBNP in the last 8760 hours. HbA1C: No results for input(s): HGBA1C in  the last 72 hours. CBG: No results for input(s): GLUCAP in the last 168 hours. Lipid Profile: No results for input(s): CHOL, HDL, LDLCALC, TRIG, CHOLHDL, LDLDIRECT in the last 72 hours. Thyroid Function Tests: No results for input(s): TSH, T4TOTAL, FREET4, T3FREE, THYROIDAB in the last 72 hours. Anemia Panel: No results for input(s): VITAMINB12, FOLATE, FERRITIN, TIBC, IRON, RETICCTPCT in the last 72 hours. Sepsis Labs: Recent Labs  Lab 06/07/18 1655  LATICACIDVEN 2.32*    No results found for this or any previous visit (from the past 240 hour(s)).       Radiology Studies: Ct Abdomen Pelvis W Contrast  Result Date: 06/07/2018 CLINICAL DATA:  GI bleed, tarry stools beginning yesterday. History of bladder cancer, hyperplastic colonic polyps, mesenteric spindle cell carcinoma. EXAM: CT ABDOMEN AND PELVIS WITH CONTRAST TECHNIQUE: Multidetector CT imaging of the abdomen and pelvis was performed using the standard protocol following bolus administration of intravenous contrast. CONTRAST:  143mL ISOVUE-300 IOPAMIDOL (ISOVUE-300) INJECTION 61% COMPARISON:  CT abdomen and pelvis May 14, 2018. FINDINGS: LOWER CHEST: Severe centrilobular emphysema. Included heart size is normal. Severe coronary artery calcifications. HEPATOBILIARY: Stable 4.5 cm hypodense mass LEFT lobe of the liver. Mild hepatomegaly. Minimal layering gallbladder sludge versus tiny stones without CT findings of acute cholecystitis. PANCREAS: Mildly atrophic, nonacute. SPLEEN: Normal. ADRENALS/URINARY TRACT: Kidneys are orthotopic, demonstrating symmetric enhancement. 2 mm nonobstructing RIGHT nephrolithiasis. No hydronephrosis or solid renal masses. Stable diffusely hypodense 6.9 cm RIGHT interpolar benign-appearing cyst. Too small to characterize hypodensities LEFT kidneys. The unopacified ureters are normal in course and caliber. Delayed imaging through the kidneys demonstrates symmetric prompt contrast excretion within the  proximal urinary collecting system. Urinary bladder is decompressed and unremarkable. Stable 12 mm LEFT adrenal nodule. STOMACH/BOWEL: The stomach, small and large bowel are normal in course and caliber without inflammatory changes, sensitivity decreased without oral contrast. Severe sigmoid colonic diverticulosis. Scattered a additional colonic diverticula. Normal appendix. VASCULAR/LYMPHATIC: Ectatic infrarenal aorta without aneurysm. Severe calcific atherosclerosis. No lymphadenopathy by CT size criteria. REPRODUCTIVE: Normal. OTHER: At least 25 cm stable lobulated solid mass RIGHT lower quadrant displacing the adjacent bowel. No intraperitoneal free fluid or free air. Granulomas in the pelvis. MUSCULOSKELETAL: Nonacute. Severe L5-S1 disc height loss and endplate spurring resulting in moderate to severe neural foraminal narrowing. IMPRESSION: 1. Colonic diverticulosis without acute diverticulitis nor acute intra-abdominal/pelvic process. 2. Stable at least 25 cm RIGHT lower mesenteric mass previously  reported a spindle cell cancer. 3. Stable hepatic and LEFT adrenal masses previously reported is suspected metastasis. 4. 2 mm nonobstructing RIGHT nephrolithiasis. Emphysema (ICD10-J43.9).  Aortic Atherosclerosis (ICD10-I70.0). Electronically Signed   By: Elon Alas M.D.   On: 06/07/2018 18:20        Scheduled Meds: . ALPRAZolam  0.5 mg Oral QHS  . budesonide      . budesonide  0.25 mg Nebulization BID  . cholecalciferol  400 Units Oral QHS  . furosemide  40 mg Oral Daily  . guaiFENesin  1,200 mg Oral BID  . ipratropium-albuterol  3 mL Nebulization QID  . multivitamin  1 tablet Oral QPM  . pantoprazole  40 mg Oral Daily  . potassium gluconate  595 mg Oral BID  . simvastatin  20 mg Oral q1800  . vitamin B-12  1,000 mcg Oral Daily   Continuous Infusions:   LOS: 1 day     Cordelia Poche, MD [facility] 06/08/2018, 8:31 AM Pager: [phone]  If 7PM-7AM, please contact  night-coverage [email or web address] 06/08/2018, 8:31 AM

## 2018-06-08 NOTE — Consult Note (Addendum)
Consultation  Referring Provider:   Dr. Lonny Prude Primary Care Physician:  [name], MD Primary Gastroenterologist: Dr. Fuller Plan       Reason for Consultation: GI bleed, anemia             HPI:   [name] is a 72 y.o. male with a past medical history of diverticulosis, adenomatous colon polyps with last colonoscopy in 2014 as well as severe COPD with chronic respiratory failure requiring chronic nasal O2 at 6 L/min, congestive heart failure, CAD and hypertension, who presented to the ER on 06/07/2018 with rectal bleeding.    Patient was recently admitted 6/29-7/4 for lower GI bleed, diverticular versus due to the big necrotic mesenteric mass in his abdomen.  He required multiple (9 per patient report) units of PRBC transfusion during that admission and felt not to be a great candidate for endoscopy due to severe baseline COPD.    Today, explains that he presented to the ER yesterday after a large dark stool yesterday that was followed by bright red blood that seem like "a lot".  He has had no further bowel movement since then.  No new complaints today compared to office visit 06/06/2018.   Denies fever or chills.  ED Course: HGB 7.0 today in ED.  Has BRBPR on rectal exam, heme positive of course.  Thankfully appears to have stable vitals this time without hypotension (unlike last admit).  2u PRBC transfusion ordered and first unit is going.  Previous GI history: 06/06/2018 office visit with Nicoletta Ba, PA-C: Please see that in depth office note following up after hospitalization for GI bleed, explained that colonoscopy was not entertained as patient was extremely poor candidate for sedation as he required 6 L of nasal O2 chronically, at that time repeat CBC was done was also discussed he may need periodic transfusion if he continued to have evidence of lower GI bleed and it was recommended he follow with his oncologist at [facility] on 06/20/2018, it was thought if he had evidence of  another acute hemorrhage and CT angiography and potential embolization would be indicated   Past Medical History:  Diagnosis Date  . Anxiety   . Bladder cancer ([place]) 2011  . Borderline diabetes mellitus   . CAD (coronary artery disease)   . Cigarette smoker   . COPD (chronic obstructive pulmonary disease) ([place])   . DJD (degenerative joint disease)   . Emphysema of lung ([place])   . Epistaxis   . History of colonic polyps 2004   hyperplastic   . History of sinusitis   . Hypercholesteremia   . Hypertension   . Obesity     Past Surgical History:  Procedure Laterality Date  . cataract surgery  septemeber 2013  . cataract surgery/sub posterior vitrectomy in left eye  1996   Dr. Zadie Rhine  . PTCA  [phone]    Family History  Problem Relation Age of Onset  . Heart disease Mother   . Heart disease Brother   . Cancer Brother   . Aneurysm Brother      Social History   Tobacco Use  . Smoking status: Former Smoker    Packs/day: 2.00    Years: 45.00    Pack years: 90.00    Types: Cigarettes    Last attempt to quit: 10/17/2015    Years since quitting: 2.6  . Smokeless tobacco: Never Used  Substance Use Topics  . Alcohol use: Yes    Alcohol/week: 0.0 oz  Comment: glass of wine at night  . Drug use: No    Prior to Admission medications   Medication Sig Start Date End Date Taking? Authorizing Provider  ALPRAZolam Duanne Moron) 0.5 MG tablet Take 0.5 mg by mouth at bedtime.    Yes [provider]  Ascorbic Acid (VITAMIN C) 1000 MG tablet Take 1,000 mg by mouth every evening.    Yes [provider]  aspirin 81 MG tablet Take 81 mg by mouth at bedtime.    Yes [provider]  budesonide (PULMICORT) 0.25 MG/2ML nebulizer solution Take 2 mLs (0.25 mg total) by nebulization 2 (two) times daily. 01/10/18  Yes Noralee Space, MD  cholecalciferol (VITAMIN D) 400 units TABS tablet Take 400 Units by mouth at bedtime.   Yes [provider]  Flaxseed,  Linseed, (FLAX SEED OIL PO) Take 1,400 mg by mouth daily.   Yes [provider]  furosemide (LASIX) 40 MG tablet Take 40 mg by mouth daily.    Yes [provider]  Garlic 6073 MG CAPS Take 1,000 mg by mouth every evening.    Yes [provider]  ipratropium-albuterol (DUONEB) 0.5-2.5 (3) MG/3ML SOLN Take 3 mLs by nebulization 4 (four) times daily. 01/10/18  Yes Noralee Space, MD  isosorbide mononitrate (IMDUR) 30 MG 24 hr tablet Take 1 tablet (30 mg total) by mouth daily. 03/16/16  Yes Weaver, Scott T, PA-C  lisinopril (PRINIVIL,ZESTRIL) 10 MG tablet Take 10 mg by mouth daily. 12/05/16  Yes [provider]  methocarbamol (ROBAXIN) 500 MG tablet TAKE 1 TO 2 TABLETS BY MOUTH TWICE A DAY AS NEEDED FOR MUSCLE CRAMPS 03/07/18  Yes [provider]  multivitamin-lutein (OCUVITE-LUTEIN) CAPS capsule Take 1 capsule by mouth every evening.    Yes [provider]  pantoprazole (PROTONIX) 40 MG tablet Take 1 tablet (40 mg total) by mouth daily. 05/18/18 06/17/18 Yes Shelly Coss, MD  Potassium 99 MG TABS Take 1 tablet by mouth 2 (two) times daily.   Yes [provider]  simvastatin (ZOCOR) 20 MG tablet Take 1 tablet (20 mg total) by mouth daily at 6 PM. 11/19/15  Yes Weaver, Nicki Reaper T, PA-C  vitamin B-12 (CYANOCOBALAMIN) 1000 MCG tablet Take 1,000 mcg by mouth daily.   Yes [provider]  Albuterol Sulfate (PROAIR RESPICLICK) 710 (90 Base) MCG/ACT AEPB Inhale 2 puffs into the lungs every 6 (six) hours as needed (shortness of breath).    [provider]  nitroGLYCERIN (NITROSTAT) 0.4 MG SL tablet Place 1 tablet (0.4 mg total) under the tongue every 5 (five) minutes as needed for chest pain. 11/19/15   Richardson Dopp T, PA-C  Polyethyl Glycol-Propyl Glycol (SYSTANE OP) Apply 1 drop to eye daily as needed (dry eyes).    [provider]  sodium chloride (OCEAN) 0.65 % SOLN nasal spray Place 1 spray into both nostrils as needed for  congestion. 10/29/15   Cherene Altes, MD    Current Facility-Administered Medications  Medication Dose Route Frequency Provider Last Rate Last Dose  . acetaminophen (TYLENOL) tablet 650 mg  650 mg Oral Q6H PRN Etta Quill, DO       Or  . acetaminophen (TYLENOL) suppository 650 mg  650 mg Rectal Q6H PRN Etta Quill, DO      . albuterol (PROVENTIL) (2.5 MG/3ML) 0.083% nebulizer solution 2.5 mg  2.5 mg Nebulization Q6H PRN Etta Quill, DO      . ALPRAZolam Duanne Moron) tablet 0.5 mg  0.5 mg  Oral QHS Jennette Kettle M, DO   0.5 mg at 06/07/18 2229  . budesonide (PULMICORT) 0.25 MG/2ML nebulizer solution           . budesonide (PULMICORT) nebulizer solution 0.25 mg  0.25 mg Nebulization BID Jennette Kettle M, DO   0.25 mg at 06/08/18 0751  . cholecalciferol (VITAMIN D) tablet 400 Units  400 Units Oral QHS Etta Quill, DO   400 Units at 06/07/18 2229  . furosemide (LASIX) tablet 40 mg  40 mg Oral Daily Jennette Kettle M, DO      . guaiFENesin [facility]) 12 hr tablet 1,200 mg  1,200 mg Oral BID Schorr, Rhetta Mura, NP   1,200 mg at 06/07/18 2229  . HYDROmorphone (DILAUDID) injection 0.5-1 mg  0.5-1 mg Intravenous Q4H PRN Etta Quill, DO      . ipratropium-albuterol (DUONEB) 0.5-2.5 (3) MG/3ML nebulizer solution 3 mL  3 mL Nebulization QID Jennette Kettle M, DO   3 mL at 06/08/18 0751  . methocarbamol (ROBAXIN) tablet 500-1,000 mg  500-1,000 mg Oral BID PRN Etta Quill, DO   500 mg at 06/07/18 2301  . multivitamin (PROSIGHT) tablet 1 tablet  1 tablet Oral QPM Etta Quill, DO   1 tablet at 06/07/18 2229  . ondansetron (ZOFRAN) tablet 4 mg  4 mg Oral Q6H PRN Etta Quill, DO       Or  . ondansetron [facility] injection 4 mg  4 mg Intravenous Q6H PRN Etta Quill, DO      . pantoprazole (PROTONIX) EC tablet 40 mg  40 mg Oral Daily Jennette Kettle M, DO      . potassium gluconate tablet 595 mg  595 mg Oral BID Etta Quill, DO   595 mg at 06/07/18 2229  .  simvastatin (ZOCOR) tablet 20 mg  20 mg Oral q1800 Etta Quill, DO      . vitamin B-12 (CYANOCOBALAMIN) tablet 1,000 mcg  1,000 mcg Oral Daily Jennette Kettle M, DO        Allergies as of 06/07/2018 - Review Complete 06/07/2018  Allergen Reaction Noted  . Amlodipine Swelling 10/22/2016     Review of Systems:    Constitutional: No fever or chills Skin: No rash  Cardiovascular: No chest pain Respiratory:+chronic SOB Gastrointestinal: See HPI and otherwise negative Genitourinary: No dysuria Neurological: No headache Musculoskeletal: No new muscle or joint pain Hematologic: No bruising Psychiatric: No history of depression or anxiety   Physical Exam:  Vital signs in last 24 hours: Temp:  [97.8 F (36.6 C)-98.8 F (37.1 C)] 97.8 F (36.6 C) (07/25 0630) Pulse Rate:  [83-110] 84 (07/25 0630) Resp:  [15-26] 16 (07/25 0630) BP: (113-138)/(64-77) 132/77 (07/25 0630) SpO2:  [98 %-100 %] 98 % (07/25 0751) Weight:  [228 lb 1.6 oz (103.5 kg)] 228 lb 1.6 oz (103.5 kg) (07/24 2200)   General:   Pleasant ill-appearing Caucasian male appears to be in NAD, Well developed, Well nourished, alert and cooperative Head:  Normocephalic and atraumatic. Eyes:   PEERL, EOMI. No icterus. Conjunctiva pink. Ears:  Normal auditory acuity. Neck:  Supple Throat: Oral cavity and pharynx without inflammation, swelling or lesion.  Lungs: Respirations even and unlabored. Lungs clear to auscultation bilaterally.   No wheezes, crackles, or rhonchi. + On O2 via nasal cannula at 6 L Heart: Normal S1, S2. No MRG. Regular rate and rhythm. No peripheral edema, cyanosis or pallor.  Abdomen:  Soft, mild distension, mild generalized ttp, some worse in  lower abdomen, No rebound or guarding. Normal bowel sounds. No appreciable masses or hepatomegaly. Rectal:  Not performed.  Msk:  Symmetrical without gross deformities.  Extremities:  Without edema, no deformity or joint abnormality.  Neurologic:  Alert and   oriented x4;  grossly normal neurologically.  Skin:   Dry and intact without significant lesions or rashes. Psychiatric: Demonstrates good judgement and reason without abnormal affect or behaviors.   LAB RESULTS: Recent Labs    06/06/18 1407 06/07/18 1631 06/08/18 0449  WBC 5.2 7.0 5.0  HGB 7.9 Repeated and verified X2.* 7.0* 7.8*  HCT 22.9 Repeated and verified X2.* 21.7* 23.8*  PLT 192.0 193 150   BMET Recent Labs    06/07/18 1631 06/08/18 0449  NA 140 139  K 4.3 4.3  CL 106 106  CO2 25 28  GLUCOSE 213* 124*  BUN 25* 21  CREATININE 0.90 0.80  CALCIUM 8.7* 8.2*   LFT Recent Labs    06/07/18 1631  PROT 6.2*  ALBUMIN 3.4*  AST 34  ALT 37  ALKPHOS 42  BILITOT 1.5*   PT/INR Recent Labs    06/07/18 1644  LABPROT 14.1  INR 1.10    STUDIES: Ct Abdomen Pelvis W Contrast  Result Date: 06/07/2018 CLINICAL DATA:  GI bleed, tarry stools beginning yesterday. History of bladder cancer, hyperplastic colonic polyps, mesenteric spindle cell carcinoma. EXAM: CT ABDOMEN AND PELVIS WITH CONTRAST TECHNIQUE: Multidetector CT imaging of the abdomen and pelvis was performed using the standard protocol following bolus administration of intravenous contrast. CONTRAST:  131mL ISOVUE-300 IOPAMIDOL (ISOVUE-300) INJECTION 61% COMPARISON:  CT abdomen and pelvis May 14, 2018. FINDINGS: LOWER CHEST: Severe centrilobular emphysema. Included heart size is normal. Severe coronary artery calcifications. HEPATOBILIARY: Stable 4.5 cm hypodense mass LEFT lobe of the liver. Mild hepatomegaly. Minimal layering gallbladder sludge versus tiny stones without CT findings of acute cholecystitis. PANCREAS: Mildly atrophic, nonacute. SPLEEN: Normal. ADRENALS/URINARY TRACT: Kidneys are orthotopic, demonstrating symmetric enhancement. 2 mm nonobstructing RIGHT nephrolithiasis. No hydronephrosis or solid renal masses. Stable diffusely hypodense 6.9 cm RIGHT interpolar benign-appearing cyst. Too small to  characterize hypodensities LEFT kidneys. The unopacified ureters are normal in course and caliber. Delayed imaging through the kidneys demonstrates symmetric prompt contrast excretion within the proximal urinary collecting system. Urinary bladder is decompressed and unremarkable. Stable 12 mm LEFT adrenal nodule. STOMACH/BOWEL: The stomach, small and large bowel are normal in course and caliber without inflammatory changes, sensitivity decreased without oral contrast. Severe sigmoid colonic diverticulosis. Scattered a additional colonic diverticula. Normal appendix. VASCULAR/LYMPHATIC: Ectatic infrarenal aorta without aneurysm. Severe calcific atherosclerosis. No lymphadenopathy by CT size criteria. REPRODUCTIVE: Normal. OTHER: At least 25 cm stable lobulated solid mass RIGHT lower quadrant displacing the adjacent bowel. No intraperitoneal free fluid or free air. Granulomas in the pelvis. MUSCULOSKELETAL: Nonacute. Severe L5-S1 disc height loss and endplate spurring resulting in moderate to severe neural foraminal narrowing. IMPRESSION: 1. Colonic diverticulosis without acute diverticulitis nor acute intra-abdominal/pelvic process. 2. Stable at least 25 cm RIGHT lower mesenteric mass previously reported a spindle cell cancer. 3. Stable hepatic and LEFT adrenal masses previously reported is suspected metastasis. 4. 2 mm nonobstructing RIGHT nephrolithiasis. Emphysema (ICD10-J43.9).  Aortic Atherosclerosis (ICD10-I70.0). Electronically Signed   By: Elon Alas M.D.   On: 06/07/2018 18:20    Impression / Plan:   Impression: 1.  Lower GI bleed: Likely from diverticulosis or sarcoma  2.  Acute blood loss anemia: Hemoglobin 7.0-->7.8 today after 2 units PRBCs last night 3.  COPD: 6 L of  oxygen at baseline, BiPAP at night 4.  Spindle cell sarcoma: Follows with oncology [place]: 1.  Continue to monitor hemoglobin and transfusion as needed less than 9, will go ahead and order 2 more units  now 2.  Again if patient has further signs of acute GI bleeding would recommend CT angiography and potential embolization 3.  Continue supportive measures 4.  Please await any final recommendations from Dr. Fuller Plan later today  Thank you for your kind consultation, we will continue to follow.  Lavone Nian [facility]  06/08/2018, 8:41 AM Pager #: [phone]     Attending physician's note   I have taken a history, examined the patient and reviewed the chart. I agree with the Advanced Practitioner's note, impression and recommendations. Recurrent LGI bleed with ABL anemia. Diverticular vs sarcoma related. Given multiple comorbid conditions would transfuse to Hb of 9 or more. He is very high risk for sedation and colonoscopy. Consider CTA, nuclear tagged RBC scan, angiography for recurrent active bleeding. Avoid ASA/NSAIDs for at least 7 days. Oncology appt at [facility] in early August.   Lucio Edward, MD [facility] [phone] office

## 2018-06-09 ENCOUNTER — Inpatient Hospital Stay (HOSPITAL_COMMUNITY): Payer: Medicare Other

## 2018-06-09 ENCOUNTER — Encounter (HOSPITAL_COMMUNITY): Payer: Self-pay | Admitting: Radiology

## 2018-06-09 DIAGNOSIS — K921 Melena: Principal | ICD-10-CM

## 2018-06-09 LAB — CBC
HCT: 25.3 % — ABNORMAL LOW (ref 39.0–52.0)
HEMATOCRIT: 26.7 % — AB (ref 39.0–52.0)
HEMOGLOBIN: 9 g/dL — AB (ref 13.0–17.0)
HEMOGLOBIN: 8.6 g/dL — AB (ref 13.0–17.0)
MCH: 30.6 pg (ref 26.0–34.0)
MCH: 30.8 pg (ref 26.0–34.0)
MCHC: 33.7 g/dL (ref 30.0–36.0)
MCHC: 34 g/dL (ref 30.0–36.0)
MCV: 90.8 fL (ref 78.0–100.0)
MCV: 90.7 fL (ref 78.0–100.0)
PLATELETS: 173 10*3/uL (ref 150–400)
Platelets: 161 10*3/uL (ref 150–400)
RBC: 2.94 MIL/uL — AB (ref 4.22–5.81)
RBC: 2.79 MIL/uL — AB (ref 4.22–5.81)
RDW: 17.6 % — AB (ref 11.5–15.5)
RDW: 17.3 % — ABNORMAL HIGH (ref 11.5–15.5)
WBC: 7.3 10*3/uL (ref 4.0–10.5)
WBC: 8.9 10*3/uL (ref 4.0–10.5)

## 2018-06-09 LAB — COMPREHENSIVE METABOLIC PANEL
ALK PHOS: 41 U/L (ref 38–126)
ALT: 36 U/L (ref 0–44)
AST: 39 U/L (ref 15–41)
Albumin: 3.2 g/dL — ABNORMAL LOW (ref 3.5–5.0)
Anion gap: 7 (ref 5–15)
BILIRUBIN TOTAL: 1.4 mg/dL — AB (ref 0.3–1.2)
BUN: 24 mg/dL — AB (ref 8–23)
CALCIUM: 8.3 mg/dL — AB (ref 8.9–10.3)
CHLORIDE: 102 mmol/L (ref 98–111)
CO2: 26 mmol/L (ref 22–32)
CREATININE: 0.9 mg/dL (ref 0.61–1.24)
Glucose, Bld: 187 mg/dL — ABNORMAL HIGH (ref 70–99)
Potassium: 4.2 mmol/L (ref 3.5–5.1)
Sodium: 135 mmol/L (ref 135–145)
Total Protein: 5.9 g/dL — ABNORMAL LOW (ref 6.5–8.1)

## 2018-06-09 LAB — PREPARE RBC (CROSSMATCH)

## 2018-06-09 MED ORDER — SODIUM CHLORIDE 0.9% IV SOLUTION
Freq: Once | INTRAVENOUS | Status: AC
  Administered 2018-06-09: 21:00:00 via INTRAVENOUS

## 2018-06-09 MED ORDER — PANTOPRAZOLE SODIUM 40 MG PO TBEC
40.0000 mg | DELAYED_RELEASE_TABLET | Freq: Two times a day (BID) | ORAL | Status: DC
  Administered 2018-06-09 – 2018-06-11 (×6): 40 mg via ORAL
  Filled 2018-06-09 (×6): qty 1

## 2018-06-09 MED ORDER — IOPAMIDOL (ISOVUE-370) INJECTION 76%
100.0000 mL | Freq: Once | INTRAVENOUS | Status: AC | PRN
  Administered 2018-06-09: 100 mL via INTRAVENOUS

## 2018-06-09 MED ORDER — SALINE SPRAY 0.65 % NA SOLN
1.0000 | NASAL | Status: DC | PRN
  Filled 2018-06-09: qty 44

## 2018-06-09 MED ORDER — IOPAMIDOL (ISOVUE-370) INJECTION 76%
INTRAVENOUS | Status: AC
  Filled 2018-06-09: qty 100

## 2018-06-09 NOTE — Progress Notes (Addendum)
Pt has had 9-10 small tarry stools since 17:00 on 06/09/18. Pt complaining of dizziness and lightheadedness at rest. HR 103. MD made aware and stat CBC ordered.

## 2018-06-09 NOTE — Care Management Important Message (Signed)
Important Message  Patient Details  Name: [name] MRN: [id number] Date of Birth: 09/24/1946   Medicare Important Message Given:  Yes    [name] 06/09/2018, 10:38 AMImportant Message  Patient Details  Name: [name] MRN: [id number] Date of Birth: 21-May-1946   Medicare Important Message Given:  Yes    [name] 06/09/2018, 10:38 AM

## 2018-06-09 NOTE — Progress Notes (Addendum)
Progress Note   Subjective  Chief Complaint: GI Bleed  This  morning patient found sitting up on side of bed, he looks pale and is experiencing increased work of breathing, he explains that lats night from about 5 pm until 10pm he had about 8-9 small black tarry, very smelly, stools. He then slept some and awoke at 6:15 this morning with a large tarry stool and has had another one since then and feels as though he could go again. The last time he went to the bathroom he was dizzy and near-syncopal. Patient tells me he just "don't feel good". He is asking for prayer this morning.   Objective   Vital signs in last 24 hours: Temp:  [97.3 F (36.3 C)-98.8 F (37.1 C)] 97.9 F (36.6 C) (07/26 0838) Pulse Rate:  [87-103] 103 (07/26 0838) Resp:  [18-24] 24 (07/26 0838) BP: (120-162)/(61-87) 162/87 (07/26 0838) SpO2:  [95 %-100 %] 100 % (07/26 0838) Last BM Date: 06/09/18(tarry stools) General:  pale  white male in NAD Heart:  Regular rate and rhythm; no murmurs Lungs: increased work of breathing, Respirations even and unlabored, lungs CTA bilaterally Abdomen:  Soft, nontender and nondistended. Normal bowel sounds. Extremities:  Without edema. Neurologic:  Alert and oriented,  grossly normal neurologically. Psych:  Cooperative. Normal mood and affect.  Intake/Output from previous day: 07/25 0701 - 07/26 0700 In: 1372 [P.O.:702; I.V.:40; Blood:630] Out: 500 [Urine:500]  Lab Results: Recent Labs    06/06/18 1407 06/07/18 1631 06/08/18 0449 06/08/18 2048  WBC 5.2 7.0 5.0  --   HGB 7.9 Repeated and verified X2.* 7.0* 7.8* 9.7*  HCT 22.9 Repeated and verified X2.* 21.7* 23.8* 28.8*  PLT 192.0 193 150  --    BMET Recent Labs    06/07/18 1631 06/08/18 0449  NA 140 139  K 4.3 4.3  CL 106 106  CO2 25 28  GLUCOSE 213* 124*  BUN 25* 21  CREATININE 0.90 0.80  CALCIUM 8.7* 8.2*   LFT Recent Labs    06/07/18 1631  PROT 6.2*  ALBUMIN 3.4*  AST 34  ALT 37  ALKPHOS 42    BILITOT 1.5*   PT/INR Recent Labs    06/07/18 1644  LABPROT 14.1  INR 1.10    Studies/Results: Ct Abdomen Pelvis W Contrast  Result Date: 06/07/2018 CLINICAL DATA:  GI bleed, tarry stools beginning yesterday. History of bladder cancer, hyperplastic colonic polyps, mesenteric spindle cell carcinoma. EXAM: CT ABDOMEN AND PELVIS WITH CONTRAST TECHNIQUE: Multidetector CT imaging of the abdomen and pelvis was performed using the standard protocol following bolus administration of intravenous contrast. CONTRAST:  139mL ISOVUE-300 IOPAMIDOL (ISOVUE-300) INJECTION 61% COMPARISON:  CT abdomen and pelvis May 14, 2018. FINDINGS: LOWER CHEST: Severe centrilobular emphysema. Included heart size is normal. Severe coronary artery calcifications. HEPATOBILIARY: Stable 4.5 cm hypodense mass LEFT lobe of the liver. Mild hepatomegaly. Minimal layering gallbladder sludge versus tiny stones without CT findings of acute cholecystitis. PANCREAS: Mildly atrophic, nonacute. SPLEEN: Normal. ADRENALS/URINARY TRACT: Kidneys are orthotopic, demonstrating symmetric enhancement. 2 mm nonobstructing RIGHT nephrolithiasis. No hydronephrosis or solid renal masses. Stable diffusely hypodense 6.9 cm RIGHT interpolar benign-appearing cyst. Too small to characterize hypodensities LEFT kidneys. The unopacified ureters are normal in course and caliber. Delayed imaging through the kidneys demonstrates symmetric prompt contrast excretion within the proximal urinary collecting system. Urinary bladder is decompressed and unremarkable. Stable 12 mm LEFT adrenal nodule. STOMACH/BOWEL: The stomach, small and large bowel are normal in course and caliber without inflammatory  changes, sensitivity decreased without oral contrast. Severe sigmoid colonic diverticulosis. Scattered a additional colonic diverticula. Normal appendix. VASCULAR/LYMPHATIC: Ectatic infrarenal aorta without aneurysm. Severe calcific atherosclerosis. No lymphadenopathy by CT  size criteria. REPRODUCTIVE: Normal. OTHER: At least 25 cm stable lobulated solid mass RIGHT lower quadrant displacing the adjacent bowel. No intraperitoneal free fluid or free air. Granulomas in the pelvis. MUSCULOSKELETAL: Nonacute. Severe L5-S1 disc height loss and endplate spurring resulting in moderate to severe neural foraminal narrowing. IMPRESSION: 1. Colonic diverticulosis without acute diverticulitis nor acute intra-abdominal/pelvic process. 2. Stable at least 25 cm RIGHT lower mesenteric mass previously reported a spindle cell cancer. 3. Stable hepatic and LEFT adrenal masses previously reported is suspected metastasis. 4. 2 mm nonobstructing RIGHT nephrolithiasis. Emphysema (ICD10-J43.9).  Aortic Atherosclerosis (ICD10-I70.0). Electronically Signed   By: Elon Alas M.D.   On: 06/07/2018 18:20     Assessment / Plan:   Assessment: 1. GI Bleed: in the past mix of brb and melena, now just melena; question upper GI bleed vs blood from tumor-plan for stat CTA, CBC and CMP this morning 2. Anemia: hgb 7.8-->9.7 at 8:00pm last night after 2 more u prbcs yesterday, stat CBC pending from this  morning 3. COPD:on 6L oxygen chronically 4. Spindle cell sarcoma: follows with [facility]  Plan: 1. Ordered stat CTA abdomen and pelvis this morning. Pending results, could consider EGD for further eval. 2. Stat CBC had been ordered, added on Stat CMP 3. Continue to monitor hgb with transfusion as needed <9 4. Please await any further recommendations from Dr. Fuller Plan later today  Thank you for your kind consultation, we will continue to follow along.   LOS: 2 days   Levin Erp  06/09/2018, 9:01 AM  Pager # [phone]     Attending physician's note   I have taken an interval history, reviewed the chart and examined the patient. I agree with the Advanced Practitioner's note, impression and recommendations. CTA today showed no evidence of a GI bleed. Bleeding appears to have stopped. Observe  for re-bleed and trend CBC. Mgmt discussed with Dr. Lonny Prude.   Lucio Edward, MD FACG [phone] office

## 2018-06-09 NOTE — Progress Notes (Signed)
PROGRESS NOTE    [name]  HLK:[id number] DOB: 02/08/1946 DOA: 06/07/2018 PCP: [name], MD   Brief Narrative: [name] is a 72 y.o. 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 Patient presented secondary to bloody stools likely with a diverticular bleed. Recurrent and frequent melenotic stools.   Assessment & Plan:   Principal Problem:   Lower GI bleed Active Problems:   Essential hypertension   COPD mixed type (HCC)   BiPAP (biphasic positive airway pressure) dependence   Spindle cell sarcoma (HCC)   Acute blood loss anemia   GI bleeding Likely diverticular however patient does take ibuprofen as an outpatient. Continuing to bleed. Has required 4 units of PRBC to date. Hemoglobin of 9 today. Continued melanotic stools. CTA unhelpful. -CBC q12 hours -Transfuse for hemoglobin less than 8 per GI recommendations -GI recommendations: trend CBC  Near syncope In setting of acute blood loss. Transient. No other symptoms.  Chronic diastolic heart failure Asymptomatic. Lisinopril held. -Continue lasix  OSA Bipap  Spindle cell sarcoma Stable. Outpatient follow-up.  COPD Chronic respiratory failure with hypoxia and hypercapnia On chronic oxygen. Stable. 6L at baseline. -Continue oxygen therapy  CAD s/p stents. Lisinopril, Imdur held secondary to acute GI bleeding -Continue Simvastatin  Hyperkalemia Mild. Resoled.  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 Recommend outpatient vascular follow-up   DVT prophylaxis: SCDs Code Status:   Code Status: Full Code Family Communication: Wife at bedside Disposition Plan: Discharge pending resolution of GI bleeding and stabilization of hemoglobin    Consultants:   [facility]  Procedures:   7/24: 1 unit PRBC  7/25: 3 units  PRBC  Antimicrobials:  None    Subjective: Bleeding overnight with multiple episodes of melena  Objective: Vitals:   06/09/18 0445 06/09/18 0838 06/09/18 1249 06/09/18 1409  BP: 123/83 (!) 162/87  126/74  Pulse: 92 (!) 103  (!) 102  Resp: 18 (!) 24  16  Temp: 98 F (36.7 C) 97.9 F (36.6 C)    TempSrc: Oral Oral    SpO2: 100% 100% 100% 100%  Weight:      Height:        Intake/Output Summary (Last 24 hours) at 06/09/2018 1427 Last data filed at 06/08/2018 1848 Gross per 24 hour  Intake 670 ml  Output 250 ml  Net 420 ml   Filed Weights   06/07/18 2200  Weight: 103.5 kg (228 lb 1.6 oz)    Examination:  General exam: Appears calm and comfortable Respiratory system: Clear to auscultation. Respiratory effort normal. Cardiovascular system: S1 & S2 heard, RRR. No murmurs. Gastrointestinal system: Abdomen is nondistended, soft and nontender. Large masses felt. Normal bowel sounds heard. Central nervous system: Alert and oriented. No focal neurological deficits. Extremities: No edema. No calf tenderness Skin: No cyanosis. No rashes Psychiatry: Judgement and insight appear normal. Mood & affect appropriate.     Data Reviewed: I have personally reviewed following labs and imaging studies  CBC: Recent Labs  Lab 06/06/18 1407 06/07/18 1631 06/08/18 0449 06/08/18 2048 06/09/18 0851  WBC 5.2 7.0 5.0  --  7.3  NEUTROABS 4.3  --   --   --   --   HGB 7.9 Repeated and verified X2.* 7.0* 7.8* 9.7* 9.0*  HCT 22.9 Repeated and verified X2.* 21.7* 23.8* 28.8* 26.7*  MCV 93.3 97.7 94.1  --  90.8  PLT 192.0 193 150  --  947   Basic Metabolic Panel: Recent Labs  Lab 06/07/18 1631 06/08/18 0449 06/09/18 0851  NA 140 139 135  K 4.3 4.3 4.2  CL 106 106 102  CO2 25 28 26   GLUCOSE 213* 124* 187*  BUN 25* 21 24*  CREATININE 0.90 0.80 0.90  CALCIUM 8.7* 8.2* 8.3*   GFR: Estimated Creatinine Clearance: 82.4 mL/min (by C-G formula based on SCr of 0.9 mg/dL). Liver  Function Tests: Recent Labs  Lab 06/07/18 1631 06/09/18 0851  AST 34 39  ALT 37 36  ALKPHOS 42 41  BILITOT 1.5* 1.4*  PROT 6.2* 5.9*  ALBUMIN 3.4* 3.2*   Recent Labs  Lab 06/07/18 1644  LIPASE 29   No results for input(s): AMMONIA in the last 168 hours. Coagulation Profile: Recent Labs  Lab 06/07/18 1644  INR 1.10   Cardiac Enzymes: No results for input(s): CKTOTAL, CKMB, CKMBINDEX, TROPONINI in the last 168 hours. BNP (last 3 results) No results for input(s): PROBNP in the last 8760 hours. HbA1C: No results for input(s): HGBA1C in the last 72 hours. CBG: No results for input(s): GLUCAP in the last 168 hours. Lipid Profile: No results for input(s): CHOL, HDL, LDLCALC, TRIG, CHOLHDL, LDLDIRECT in the last 72 hours. Thyroid Function Tests: No results for input(s): TSH, T4TOTAL, FREET4, T3FREE, THYROIDAB in the last 72 hours. Anemia Panel: No results for input(s): VITAMINB12, FOLATE, FERRITIN, TIBC, IRON, RETICCTPCT in the last 72 hours. Sepsis Labs: Recent Labs  Lab 06/07/18 1655  LATICACIDVEN 2.32*    No results found for this or any previous visit (from the past 240 hour(s)).       Radiology Studies: Ct Abdomen Pelvis W Contrast  Result Date: 06/07/2018 CLINICAL DATA:  GI bleed, tarry stools beginning yesterday. History of bladder cancer, hyperplastic colonic polyps, mesenteric spindle cell carcinoma. EXAM: CT ABDOMEN AND PELVIS WITH CONTRAST TECHNIQUE: Multidetector CT imaging of the abdomen and pelvis was performed using the standard protocol following bolus administration of intravenous contrast. CONTRAST:  155mL ISOVUE-300 IOPAMIDOL (ISOVUE-300) INJECTION 61% COMPARISON:  CT abdomen and pelvis May 14, 2018. FINDINGS: LOWER CHEST: Severe centrilobular emphysema. Included heart size is normal. Severe coronary artery calcifications. HEPATOBILIARY: Stable 4.5 cm hypodense mass LEFT lobe of the liver. Mild hepatomegaly. Minimal layering gallbladder sludge versus  tiny stones without CT findings of acute cholecystitis. PANCREAS: Mildly atrophic, nonacute. SPLEEN: Normal. ADRENALS/URINARY TRACT: Kidneys are orthotopic, demonstrating symmetric enhancement. 2 mm nonobstructing RIGHT nephrolithiasis. No hydronephrosis or solid renal masses. Stable diffusely hypodense 6.9 cm RIGHT interpolar benign-appearing cyst. Too small to characterize hypodensities LEFT kidneys. The unopacified ureters are normal in course and caliber. Delayed imaging through the kidneys demonstrates symmetric prompt contrast excretion within the proximal urinary collecting system. Urinary bladder is decompressed and unremarkable. Stable 12 mm LEFT adrenal nodule. STOMACH/BOWEL: The stomach, small and large bowel are normal in course and caliber without inflammatory changes, sensitivity decreased without oral contrast. Severe sigmoid colonic diverticulosis. Scattered a additional colonic diverticula. Normal appendix. VASCULAR/LYMPHATIC: Ectatic infrarenal aorta without aneurysm. Severe calcific atherosclerosis. No lymphadenopathy by CT size criteria. REPRODUCTIVE: Normal. OTHER: At least 25 cm stable lobulated solid mass RIGHT lower quadrant displacing the adjacent bowel. No intraperitoneal free fluid or free air. Granulomas in the pelvis. MUSCULOSKELETAL: Nonacute. Severe L5-S1 disc height loss and endplate spurring resulting in moderate to severe neural foraminal narrowing. IMPRESSION: 1. Colonic diverticulosis without acute diverticulitis nor acute intra-abdominal/pelvic process. 2. Stable at least 25 cm RIGHT lower mesenteric mass previously  reported a spindle cell cancer. 3. Stable hepatic and LEFT adrenal masses previously reported is suspected metastasis. 4. 2 mm nonobstructing RIGHT nephrolithiasis. Emphysema (ICD10-J43.9).  Aortic Atherosclerosis (ICD10-I70.0). Electronically Signed   By: Elon Alas M.D.   On: 06/07/2018 18:20   Ct Angio Abd/pel W/ And/or W/o  Result Date:  06/09/2018 CLINICAL DATA:  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 Evaluate for GI bleed. EXAM: CTA ABDOMEN AND PELVIS WITH CONTRAST TECHNIQUE: Multidetector CT imaging of the abdomen and pelvis was performed using the standard protocol during bolus administration of intravenous contrast. Multiplanar reconstructed images and MIPs were obtained and reviewed to evaluate the vascular anatomy. CONTRAST:  120mL ISOVUE-370 IOPAMIDOL (ISOVUE-370) INJECTION 76% COMPARISON:  06/07/2018; 05/13/2018; 02/18/2017; ultrasound-guided abdominal mass biopsy - 02/20/2017 FINDINGS: VASCULAR Aorta: Scattered atherosclerotic plaque within a normal caliber abdominal aorta, not resulting in a hemodynamically significant stenosis. Celiac: Minimal calcified atherosclerotic plaque involving the origin the celiac artery, not resulting in a hemodynamically significant stenosis. SMA: Minimal amount of calcified atherosclerotic plaque involving the proximal aspect the main trunk of the SMA, not resulting in a hemodynamically significant stenosis. The distal tributaries the SMA appear widely patent without discrete limb of filling defect to suggest distal embolism. There is a minimal amount of arterial opacification involving the superior medial aspect of the large mesenteric mass without discrete area of vessel irregularity to suggest active extravasation. 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 Minimal amount of calcified atherosclerotic plaque involves the origins of the bilateral dominant renal arteries, left greater than right, not resulting in hemodynamically significant stenosis. No discrete area of vessel irregularity to suggest FMD. IMA: Widely patent without hemodynamically significant narrowing. Inflow: Calcified atherosclerotic plaque involves the origin of the left common iliac artery resulting in focal severe (at least 70%) luminal narrowing (axial image 112, series 5, coronal  image 110, series 8). Proximal Outflow: Suspected hemodynamically significant narrowings involving the bilateral superficial femoral arteries, right greater than left. Veins: The IVC and pelvic venous system appear widely patent. Review of the MIP images confirms the above findings. NON-VASCULAR Lower chest: Limited visualization of lower thorax demonstrates markedly advanced paraseptal emphysematous change. No focal airspace opacities. No pleural effusion. Normal heart size. Coronary artery calcifications. Note is made of a prominent right infrahilar lymph node measuring 1.2 cm in diameter (image 1, series 5). Hepatobiliary: Normal hepatic contour. Unchanged approximately 4.3 x 2.8 cm hypoattenuating nonenhancing lesion within subcapsular aspect of the lateral segment of the left lobe of the liver. Radiopaque debris in suspected punctate (approximately 0.4 cm) stone is seen within neck of an otherwise normal-appearing gallbladder. No gallbladder wall thickening or pericholecystic fluid. No intra or extrahepatic biliary ductal dilatation. No ascites. Pancreas: Normal appearance of the pancreas Spleen: Normal appearance of the spleen. Note is made of a small splenule. Adrenals/Urinary Tract: There is symmetric enhancement of the bilateral kidneys. Note is made of an approximately 6.7 cm hypoattenuating right-sided renal cyst. Additional left-sided punctate (subcentimeter) left-sided renal lesions are too small to adequately characterize of favored to represent additional renal cysts. No definite renal stones this postcontrast examination. No urine obstruction or perinephric stranding. Normal appearance the bilateral adrenal glands. Normal appearance of the urinary bladder given degree distention. Stomach/Bowel: The approximately 25.0 x 14.7 x 17.3 cm mass centered within the right mid abdominal mesentery is grossly unchanged compared to the 02/18/2017 examination, previously, 25.3 x 16.5 x 18.3 cm. No discrete areas  of contrast extravasation are seen associated with the large  mesenteric mass to suggest active hemorrhage. A small amount of fluid is seen within the right lower abdomen caudal to the mesenteric mass, similar to the 04/2018 examination. There is mass effect of the mesenteric mass upon the adjacent colon and small bowel, not resulting in enteric obstruction. There are no discrete areas of intraluminal opacification to suggest an active GI bleed. Scattered colonic diverticulosis without evidence of diverticulitis. Normal appearance of the terminal ileum and retrocecal appendix. No pneumoperitoneum, pneumatosis or portal venous gas. Lymphatic: No bulky retroperitoneal, mesenteric, pelvic or inguinal lymphadenopathy. Reproductive: Appearance of the prostate gland. No free fluid in the pelvic cul-de-sac. Other: Mild diffuse body wall anasarca. Musculoskeletal: No acute or aggressive osseous abnormalities. 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 Mild-to-moderate multilevel lumbar spine DDD, worse at L5-S1 with disc space height loss, endplate irregularity and sclerosis. IMPRESSION: VASCULAR 1. No evidence of GI bleed. Additionally, there is no evidence of active bleeding associated with patient's known large mesenteric mass. 2. Atherosclerotic plaque within a normal caliber abdominal aorta. Aortic Atherosclerosis (ICD10-I70.0). 3. Suspected hemodynamically significant stenoses involving the left common iliac artery as well as the bilateral superficial femoral arteries, right greater than left. NON-VASCULAR 1. Grossly unchanged appearance of previously biopsied spindle-cell sarcoma within the right mid hemiabdomen measuring approximately 25 cm in maximal diameter, similar to the 02/2017 examination. 2. Unchanged approximately 4.3 cm hypoattenuating nonenhancing presumably cystic metastasis within the left lobe of the liver. 3. Cholelithiasis without evidence of cholecystitis. 4. Markedly advanced  emphysematous change within the imaged lung bases. Emphysema (ICD10-J43.9). Electronically Signed   By: Sandi Mariscal M.D.   On: 06/09/2018 10:59        Scheduled Meds: . ALPRAZolam  0.5 mg Oral QHS  . budesonide  0.25 mg Nebulization BID  . cholecalciferol  400 Units Oral QHS  . furosemide  40 mg Oral Daily  . guaiFENesin  1,200 mg Oral BID  . iopamidol      . ipratropium-albuterol  3 mL Nebulization QID  . multivitamin  1 tablet Oral QPM  . pantoprazole  40 mg Oral BID AC  . potassium gluconate  595 mg Oral BID  . simvastatin  20 mg Oral q1800  . vitamin B-12  1,000 mcg Oral Daily   Continuous Infusions:   LOS: 2 days     Cordelia Poche, MD [facility] 06/09/2018, 2:27 PM Pager: [phone]  If 7PM-7AM, please contact night-coverage [email or web address] 06/09/2018, 2:27 PM  PROGRESS NOTE    [name]  WLS:[id number] DOB: October 16, 1946 DOA: 06/07/2018 PCP: [name], MD   Brief Narrative: [name] is a 72 y.o. 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 He presented secondary to melena and anemia. He has been transfused 2 units of PRBC.   Assessment & Plan:   Principal Problem:   Lower GI bleed Active Problems:   Essential hypertension   COPD mixed type (HCC)   BiPAP (biphasic positive airway pressure) dependence   Spindle cell sarcoma (HCC)   Acute blood loss anemia   GI bleeding Bright red blood per rectum. Presumed lower GI bleed. Recurrent issue. -GI recommendations: if bleeding, plan on CT angiography; another 2 units of PRBC  Acute blood loss anemia Secondary to GI bleeding. Received 2 units of PRBC  Essential hypertension Holding lisinopril and Imdur.  COPD Chronic respiratory failure with hypoxia Baseline. Bipap at night -Continue Duoneb  Spindle cell sarcoma Stable.   DVT prophylaxis: SCDs Code Status:   Code Status: Full Code Family  Communication: Wife at bedside Disposition Plan: Discharge hopefully in 24-48 hours if (a) bleeding stops/stablizes or (b) source of bleeding is identified and treated   Consultants:   Gastroenterology  Procedures:   7/24: 2 units of PRBC  Antimicrobials:  None    Subjective: Melena overnight.  Objective: Vitals:   06/09/18 0445 06/09/18 0838 06/09/18 1249 06/09/18 1409  BP: 123/83 (!) 162/87  126/74  Pulse: 92 (!) 103  (!) 102  Resp: 18 (!) 24  16  Temp: 98 F (36.7 C) 97.9 F (36.6 C)    TempSrc: Oral Oral    SpO2: 100% 100% 100% 100%  Weight:      Height:        Intake/Output Summary (Last 24 hours) at 06/09/2018 1427 Last data filed at 06/08/2018 1848 Gross per 24 hour  Intake 670 ml  Output 250 ml  Net 420 ml   Filed Weights   06/07/18 2200  Weight: 103.5 kg (228 lb 1.6 oz)    Examination:  General exam: Appears calm and comfortable Respiratory system: Clear to auscultation. Respiratory effort normal. Cardiovascular system: S1 & S2 heard, RRR. No murmurs, rubs, gallops or clicks. Gastrointestinal system: Abdomen is nondistended, soft and nontender. Firm mass of upper abdomen. Normal bowel sounds heard. Central nervous system: Alert and oriented. No focal neurological deficits. Extremities: 1+ edema. No calf tenderness Skin: No cyanosis. No rashes Psychiatry: Judgement and insight appear normal. Mood & affect appropriate.     Data Reviewed: I have personally reviewed following labs and imaging studies  CBC: Recent Labs  Lab 06/06/18 1407 06/07/18 1631 06/08/18 0449 06/08/18 2048 06/09/18 0851  WBC 5.2 7.0 5.0  --  7.3  NEUTROABS 4.3  --   --   --   --   HGB 7.9 Repeated and verified X2.* 7.0* 7.8* 9.7* 9.0*  HCT 22.9 Repeated and verified X2.* 21.7* 23.8* 28.8* 26.7*  MCV 93.3 97.7 94.1  --  90.8  PLT 192.0 193 150  --  662   Basic Metabolic Panel: Recent Labs  Lab 06/07/18 1631 06/08/18 0449 06/09/18 0851  NA 140 139 135  K 4.3  4.3 4.2  CL 106 106 102  CO2 25 28 26   GLUCOSE 213* 124* 187*  BUN 25* 21 24*  CREATININE 0.90 0.80 0.90  CALCIUM 8.7* 8.2* 8.3*   GFR: Estimated Creatinine Clearance: 82.4 mL/min (by C-G formula based on SCr of 0.9 mg/dL). Liver Function Tests: Recent Labs  Lab 06/07/18 1631 06/09/18 0851  AST 34 39  ALT 37 36  ALKPHOS 42 41  BILITOT 1.5* 1.4*  PROT 6.2* 5.9*  ALBUMIN 3.4* 3.2*   Recent Labs  Lab 06/07/18 1644  LIPASE 29   No results for input(s): AMMONIA in the last 168 hours. Coagulation Profile: Recent Labs  Lab 06/07/18 1644  INR 1.10   Cardiac Enzymes: No results for input(s): CKTOTAL, CKMB, CKMBINDEX, TROPONINI in the last 168 hours. BNP (last 3 results) No results for input(s): PROBNP in the last 8760 hours. HbA1C: No results for input(s): HGBA1C in the last 72 hours. CBG: No results for input(s): GLUCAP in the last 168 hours. Lipid Profile: No results for input(s): CHOL, HDL, LDLCALC, TRIG, CHOLHDL, LDLDIRECT in the last 72 hours. Thyroid Function Tests: No results for input(s): TSH, T4TOTAL, FREET4, T3FREE, THYROIDAB in the last 72 hours. Anemia Panel: No results for input(s): VITAMINB12, FOLATE,  FERRITIN, TIBC, IRON, RETICCTPCT in the last 72 hours. Sepsis Labs: Recent Labs  Lab 06/07/18 1655  LATICACIDVEN 2.32*    No results found for this or any previous visit (from the past 240 hour(s)).       Radiology Studies: Ct Abdomen Pelvis W Contrast  Result Date: 06/07/2018 CLINICAL DATA:  GI bleed, tarry stools beginning yesterday. History of bladder cancer, hyperplastic colonic polyps, mesenteric spindle cell carcinoma. EXAM: CT ABDOMEN AND PELVIS WITH CONTRAST TECHNIQUE: Multidetector CT imaging of the abdomen and pelvis was performed using the standard protocol following bolus administration of intravenous contrast. CONTRAST:  177mL ISOVUE-300 IOPAMIDOL (ISOVUE-300) INJECTION 61% COMPARISON:  CT abdomen and pelvis May 14, 2018. FINDINGS: LOWER  CHEST: Severe centrilobular emphysema. Included heart size is normal. Severe coronary artery calcifications. HEPATOBILIARY: Stable 4.5 cm hypodense mass LEFT lobe of the liver. Mild hepatomegaly. Minimal layering gallbladder sludge versus tiny stones without CT findings of acute cholecystitis. PANCREAS: Mildly atrophic, nonacute. SPLEEN: Normal. ADRENALS/URINARY TRACT: Kidneys are orthotopic, demonstrating symmetric enhancement. 2 mm nonobstructing RIGHT nephrolithiasis. No hydronephrosis or solid renal masses. Stable diffusely hypodense 6.9 cm RIGHT interpolar benign-appearing cyst. Too small to characterize hypodensities LEFT kidneys. The unopacified ureters are normal in course and caliber. Delayed imaging through the kidneys demonstrates symmetric prompt contrast excretion within the proximal urinary collecting system. Urinary bladder is decompressed and unremarkable. Stable 12 mm LEFT adrenal nodule. STOMACH/BOWEL: The stomach, small and large bowel are normal in course and caliber without inflammatory changes, sensitivity decreased without oral contrast. Severe sigmoid colonic diverticulosis. Scattered a additional colonic diverticula. Normal appendix. VASCULAR/LYMPHATIC: Ectatic infrarenal aorta without aneurysm. Severe calcific atherosclerosis. No lymphadenopathy by CT size criteria. REPRODUCTIVE: Normal. OTHER: At least 25 cm stable lobulated solid mass RIGHT lower quadrant displacing the adjacent bowel. No intraperitoneal free fluid or free air. Granulomas in the pelvis. MUSCULOSKELETAL: Nonacute. Severe L5-S1 disc height loss and endplate spurring resulting in moderate to severe neural foraminal narrowing. IMPRESSION: 1. Colonic diverticulosis without acute diverticulitis nor acute intra-abdominal/pelvic process. 2. Stable at least 25 cm RIGHT lower mesenteric mass previously reported a spindle cell cancer. 3. Stable hepatic and LEFT adrenal masses previously reported is suspected metastasis. 4. 2 mm  nonobstructing RIGHT nephrolithiasis. Emphysema (ICD10-J43.9).  Aortic Atherosclerosis (ICD10-I70.0). Electronically Signed   By: Elon Alas M.D.   On: 06/07/2018 18:20   Ct Angio Abd/pel W/ And/or W/o  Result Date: 06/09/2018 CLINICAL DATA:  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 Evaluate for GI bleed. EXAM: CTA ABDOMEN AND PELVIS WITH CONTRAST TECHNIQUE: Multidetector CT imaging of the abdomen and pelvis was performed using the standard protocol during bolus administration of intravenous contrast. Multiplanar reconstructed images and MIPs were obtained and reviewed to evaluate the vascular anatomy. CONTRAST:  116mL ISOVUE-370 IOPAMIDOL (ISOVUE-370) INJECTION 76% COMPARISON:  06/07/2018; 05/13/2018; 02/18/2017; ultrasound-guided abdominal mass biopsy - 02/20/2017 FINDINGS: VASCULAR Aorta: Scattered atherosclerotic plaque within a normal caliber abdominal aorta, not resulting in a hemodynamically significant stenosis. Celiac: Minimal calcified atherosclerotic plaque involving the origin the celiac artery, not resulting in a hemodynamically significant stenosis. SMA: Minimal amount of calcified atherosclerotic plaque involving the proximal aspect the main trunk of the SMA, not resulting in a hemodynamically significant stenosis. The distal tributaries the SMA appear widely patent without discrete limb of filling defect to suggest distal embolism. There is a minimal amount of arterial opacification involving the superior medial aspect of the large mesenteric mass without discrete area of vessel irregularity to suggest active extravasation. Renals: Duplicated  renal arteries are seen bilaterally. Minimal amount of calcified atherosclerotic plaque involves the origins of the bilateral dominant renal arteries, left greater than right, not resulting in hemodynamically significant stenosis. No discrete area of vessel irregularity to suggest FMD. IMA: Widely patent without  hemodynamically significant narrowing. Inflow: Calcified atherosclerotic plaque involves the origin of the left common iliac artery resulting in focal severe (at least 70%) luminal narrowing (axial image 112, series 5, coronal image 110, series 8). Proximal Outflow: Suspected hemodynamically significant narrowings involving the bilateral superficial femoral arteries, right greater than left. Veins: The IVC and pelvic venous system appear widely patent. Review of the MIP images confirms the above findings. NON-VASCULAR Lower chest: Limited visualization of lower thorax demonstrates markedly advanced paraseptal emphysematous change. No focal airspace opacities. No pleural effusion. Normal heart size. Coronary artery calcifications. Note is made of a prominent right infrahilar lymph node measuring 1.2 cm in diameter (image 1, series 5). Hepatobiliary: Normal hepatic contour. Unchanged approximately 4.3 x 2.8 cm hypoattenuating nonenhancing lesion within subcapsular aspect of the lateral segment of the left lobe of the liver. Radiopaque debris in suspected punctate (approximately 0.4 cm) stone is seen within neck of an otherwise normal-appearing gallbladder. No gallbladder wall thickening or pericholecystic fluid. No intra or extrahepatic biliary ductal dilatation. No ascites. Pancreas: Normal appearance of the pancreas Spleen: Normal appearance of the spleen. Note is made of a small splenule. Adrenals/Urinary Tract: There is symmetric enhancement of the bilateral kidneys. Note is made of an approximately 6.7 cm hypoattenuating right-sided renal cyst. Additional left-sided punctate (subcentimeter) left-sided renal lesions are too small to adequately characterize of favored to represent additional renal cysts. No definite renal stones this postcontrast examination. No urine obstruction or perinephric stranding. Normal appearance the bilateral adrenal glands. Normal appearance of the urinary bladder given degree  distention. Stomach/Bowel: The approximately 25.0 x 14.7 x 17.3 cm mass centered within the right mid abdominal mesentery is grossly unchanged compared to the 02/18/2017 examination, previously, 25.3 x 16.5 x 18.3 cm. No discrete areas of contrast extravasation are seen associated with the large mesenteric mass to suggest active hemorrhage. A small amount of fluid is seen within the right lower abdomen caudal to the mesenteric mass, similar to the 04/2018 examination. There is mass effect of the mesenteric mass upon the adjacent colon and small bowel, not resulting in enteric obstruction. There are no discrete areas of intraluminal opacification to suggest an active GI bleed. Scattered colonic diverticulosis without evidence of diverticulitis. Normal appearance of the terminal ileum and retrocecal appendix. No pneumoperitoneum, pneumatosis or portal venous gas. Lymphatic: No bulky retroperitoneal, mesenteric, pelvic or inguinal lymphadenopathy. Reproductive: Appearance of the prostate gland. No free fluid in the pelvic cul-de-sac. Other: Mild diffuse body wall anasarca. Musculoskeletal: No acute or aggressive osseous abnormalities. 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 Mild-to-moderate multilevel lumbar spine DDD, worse at L5-S1 with disc space height loss, endplate irregularity and sclerosis. IMPRESSION: VASCULAR 1. No evidence of GI bleed. Additionally, there is no evidence of active bleeding associated with patient's known large mesenteric mass. 2. Atherosclerotic plaque within a normal caliber abdominal aorta. Aortic Atherosclerosis (ICD10-I70.0). 3. Suspected hemodynamically significant stenoses involving the left common iliac artery as well as the bilateral superficial femoral arteries, right greater than left. NON-VASCULAR 1. 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 2. Unchanged approximately 4.3 cm hypoattenuating nonenhancing presumably cystic metastasis within  the left lobe of the liver. 3. Cholelithiasis without evidence of cholecystitis. 4. Markedly advanced emphysematous change within the imaged lung bases. Emphysema (ICD10-J43.9). Electronically Signed   By: Sandi Mariscal M.D.   On: 06/09/2018 10:59        Scheduled Meds: . ALPRAZolam  0.5 mg Oral QHS  . budesonide  0.25 mg Nebulization BID  . cholecalciferol  400 Units Oral QHS  . furosemide  40 mg Oral Daily  . guaiFENesin  1,200 mg Oral BID  . iopamidol      . ipratropium-albuterol  3 mL Nebulization QID  . multivitamin  1 tablet Oral QPM  . pantoprazole  40 mg Oral BID AC  . potassium gluconate  595 mg Oral BID  . simvastatin  20 mg Oral q1800  . vitamin B-12  1,000 mcg Oral Daily   Continuous Infusions:   LOS: 2 days     Cordelia Poche, MD [facility] 06/09/2018, 2:27 PM Pager: [phone]  If 7PM-7AM, please contact night-coverage [email or web address] 06/09/2018, 2:27 PM

## 2018-06-10 LAB — PREPARE RBC (CROSSMATCH)

## 2018-06-10 LAB — CBC
HCT: 24.3 % — ABNORMAL LOW (ref 39.0–52.0)
HEMATOCRIT: 23.9 % — AB (ref 39.0–52.0)
Hemoglobin: 8.2 g/dL — ABNORMAL LOW (ref 13.0–17.0)
Hemoglobin: 8.3 g/dL — ABNORMAL LOW (ref 13.0–17.0)
MCH: 30.7 pg (ref 26.0–34.0)
MCH: 30.7 pg (ref 26.0–34.0)
MCHC: 34.3 g/dL (ref 30.0–36.0)
MCHC: 34.2 g/dL (ref 30.0–36.0)
MCV: 89.5 fL (ref 78.0–100.0)
MCV: 90 fL (ref 78.0–100.0)
PLATELETS: 137 10*3/uL — AB (ref 150–400)
PLATELETS: 130 10*3/uL — AB (ref 150–400)
RBC: 2.67 MIL/uL — ABNORMAL LOW (ref 4.22–5.81)
RBC: 2.7 MIL/uL — ABNORMAL LOW (ref 4.22–5.81)
RDW: 16.7 % — AB (ref 11.5–15.5)
RDW: 16.1 % — AB (ref 11.5–15.5)
WBC: 11 10*3/uL — ABNORMAL HIGH (ref 4.0–10.5)
WBC: 10.7 10*3/uL — ABNORMAL HIGH (ref 4.0–10.5)

## 2018-06-10 MED ORDER — AMOXICILLIN-POT CLAVULANATE 875-125 MG PO TABS
1.0000 | ORAL_TABLET | Freq: Two times a day (BID) | ORAL | Status: DC
  Administered 2018-06-10 – 2018-06-11 (×2): 1 via ORAL
  Filled 2018-06-10 (×2): qty 1

## 2018-06-10 MED ORDER — SODIUM CHLORIDE 0.9% IV SOLUTION
Freq: Once | INTRAVENOUS | Status: AC
  Administered 2018-06-10: 06:00:00 via INTRAVENOUS

## 2018-06-10 NOTE — Progress Notes (Signed)
PROGRESS NOTE    [name]  OMV:[id number] DOB: 11/16/1945 DOA: 06/07/2018 PCP: [name], MD   Brief Narrative: [name] is a 72 y.o. 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 Patient presented secondary to bloody stools likely with a diverticular bleed. Recurrent and frequent melenotic stools. Continues to have recurrent anemia   Assessment & Plan:   Principal Problem:   Lower GI bleed Active Problems:   Essential hypertension   COPD mixed type (HCC)   BiPAP (biphasic positive airway pressure) dependence   Spindle cell sarcoma (HCC)   Acute blood loss anemia   GI bleeding Thought to be diverticular however patient does take ibuprofen as an outpatient. Continuing to bleed. Has required 5 units of PRBC to date. Hemoglobin of 8.2 today. Continued melanotic stools. CTA unhelpful. -CBC q12 hours -Transfuse for hemoglobin less than 8 per GI recommendations -GI recommendations: trend CBC, 1 unit PRBC  Near syncope In setting of acute blood loss. Transient. No other symptoms.  Chronic diastolic heart failure Asymptomatic. Lisinopril held. -Continue lasix  OSA Bipap  Spindle cell sarcoma Stable. Outpatient follow-up.  COPD Chronic respiratory failure with hypoxia and hypercapnia On chronic oxygen. Stable. 6L at baseline. -Continue oxygen therapy  CAD s/p stents. Lisinopril, Imdur held secondary to acute GI bleeding -Continue Simvastatin  Hyperkalemia Mild. Resoled.  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 Recommend outpatient vascular follow-up   DVT prophylaxis: SCDs Code Status:   Code Status: Full Code Family Communication: Wife at bedside Disposition Plan: Discharge pending resolution of GI bleeding and stabilization of hemoglobin    Consultants:   [facility]  Procedures:   7/24: 1  unit PRBC  7/25: 3 units PRBC  Antimicrobials:  None    Subjective: Bleeding overnight with multiple episodes of melena  Objective: Vitals:   06/10/18 0834 06/10/18 0909 06/10/18 1046 06/10/18 1210  BP: 135/69 140/84  136/79  Pulse: (!) 110 (!) 112  (!) 106  Resp:  (!) 24  (!) 22  Temp: 99.2 F (37.3 C) 99.6 F (37.6 C)  99.8 F (37.7 C)  TempSrc: Oral Oral  Oral  SpO2: 99% 98% 97% 98%  Weight:      Height:        Intake/Output Summary (Last 24 hours) at 06/10/2018 1240 Last data filed at 06/10/2018 1215 Gross per 24 hour  Intake 1175 ml  Output 1050 ml  Net 125 ml   Filed Weights   06/07/18 2200  Weight: 103.5 kg (228 lb 1.6 oz)    Examination:  General exam: Appears calm and comfortable Respiratory system: Clear to auscultation. Respiratory effort normal. Cardiovascular system: S1 & S2 heard, RRR. No murmurs. Gastrointestinal system: Abdomen is nondistended, soft and nontender. Large masses felt. Normal bowel sounds heard. Central nervous system: Alert and oriented. No focal neurological deficits. Extremities: No edema. No calf tenderness Skin: No cyanosis. No rashes Psychiatry: Judgement and insight appear normal. Mood & affect appropriate.     Data Reviewed: I have personally reviewed following labs and imaging studies  CBC: Recent Labs  Lab 06/06/18 1407 06/07/18 1631 06/08/18 0449 06/08/18 2048 06/09/18 0851 06/09/18 1658 06/10/18 0347  WBC 5.2 7.0 5.0  --  7.3 8.9 11.0*  NEUTROABS 4.3  --   --   --   --   --   --   HGB 7.9 Repeated and  verified X2.* 7.0* 7.8* 9.7* 9.0* 8.6* 8.2*  HCT 22.9 Repeated and verified X2.* 21.7* 23.8* 28.8* 26.7* 25.3* 23.9*  MCV 93.3 97.7 94.1  --  90.8 90.7 89.5  PLT 192.0 193 150  --  161 173 742*   Basic Metabolic Panel: Recent Labs  Lab 06/07/18 1631 06/08/18 0449 06/09/18 0851  NA 140 139 135  K 4.3 4.3 4.2  CL 106 106 102  CO2 25 28 26   GLUCOSE 213* 124* 187*  BUN 25* 21 24*  CREATININE 0.90 0.80  0.90  CALCIUM 8.7* 8.2* 8.3*   GFR: Estimated Creatinine Clearance: 82.4 mL/min (by C-G formula based on SCr of 0.9 mg/dL). Liver Function Tests: Recent Labs  Lab 06/07/18 1631 06/09/18 0851  AST 34 39  ALT 37 36  ALKPHOS 42 41  BILITOT 1.5* 1.4*  PROT 6.2* 5.9*  ALBUMIN 3.4* 3.2*   Recent Labs  Lab 06/07/18 1644  LIPASE 29   No results for input(s): AMMONIA in the last 168 hours. Coagulation Profile: Recent Labs  Lab 06/07/18 1644  INR 1.10   Cardiac Enzymes: No results for input(s): CKTOTAL, CKMB, CKMBINDEX, TROPONINI in the last 168 hours. BNP (last 3 results) No results for input(s): PROBNP in the last 8760 hours. HbA1C: No results for input(s): HGBA1C in the last 72 hours. CBG: No results for input(s): GLUCAP in the last 168 hours. Lipid Profile: No results for input(s): CHOL, HDL, LDLCALC, TRIG, CHOLHDL, LDLDIRECT in the last 72 hours. Thyroid Function Tests: No results for input(s): TSH, T4TOTAL, FREET4, T3FREE, THYROIDAB in the last 72 hours. Anemia Panel: No results for input(s): VITAMINB12, FOLATE, FERRITIN, TIBC, IRON, RETICCTPCT in the last 72 hours. Sepsis Labs: Recent Labs  Lab 06/07/18 1655  LATICACIDVEN 2.32*    No results found for this or any previous visit (from the past 240 hour(s)).       Radiology Studies: Ct Angio Abd/pel W/ And/or W/o  Result Date: 06/09/2018 CLINICAL DATA:  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 Evaluate for GI bleed. EXAM: CTA ABDOMEN AND PELVIS WITH CONTRAST TECHNIQUE: Multidetector CT imaging of the abdomen and pelvis was performed using the standard protocol during bolus administration of intravenous contrast. Multiplanar reconstructed images and MIPs were obtained and reviewed to evaluate the vascular anatomy. CONTRAST:  159mL ISOVUE-370 IOPAMIDOL (ISOVUE-370) INJECTION 76% COMPARISON:  06/07/2018; 05/13/2018; 02/18/2017; ultrasound-guided abdominal mass biopsy - 02/20/2017  FINDINGS: VASCULAR Aorta: Scattered atherosclerotic plaque within a normal caliber abdominal aorta, not resulting in a hemodynamically significant stenosis. Celiac: Minimal calcified atherosclerotic plaque involving the origin the celiac artery, not resulting in a hemodynamically significant stenosis. SMA: Minimal amount of calcified atherosclerotic plaque involving the proximal aspect the main trunk of the SMA, not resulting in a hemodynamically significant stenosis. The distal tributaries the SMA appear widely patent without discrete limb of filling defect to suggest distal embolism. There is a minimal amount of arterial opacification involving the superior medial aspect of the large mesenteric mass without discrete area of vessel irregularity to suggest active extravasation. 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 Minimal amount of calcified atherosclerotic plaque involves the origins of the bilateral dominant renal arteries, left greater than right, not resulting in hemodynamically significant stenosis. No discrete area of vessel irregularity to suggest FMD. IMA: Widely patent without hemodynamically significant narrowing. Inflow: Calcified atherosclerotic plaque involves the origin of the left common iliac artery resulting in focal severe (at least 70%) luminal narrowing (axial image 112, series 5, coronal image  110, series 8). Proximal Outflow: Suspected hemodynamically significant narrowings involving the bilateral superficial femoral arteries, right greater than left. Veins: The IVC and pelvic venous system appear widely patent. Review of the MIP images confirms the above findings. NON-VASCULAR Lower chest: Limited visualization of lower thorax demonstrates markedly advanced paraseptal emphysematous change. No focal airspace opacities. No pleural effusion. Normal heart size. Coronary artery calcifications. Note is made of a prominent right infrahilar lymph node measuring 1.2 cm in diameter  (image 1, series 5). Hepatobiliary: Normal hepatic contour. Unchanged approximately 4.3 x 2.8 cm hypoattenuating nonenhancing lesion within subcapsular aspect of the lateral segment of the left lobe of the liver. Radiopaque debris in suspected punctate (approximately 0.4 cm) stone is seen within neck of an otherwise normal-appearing gallbladder. No gallbladder wall thickening or pericholecystic fluid. No intra or extrahepatic biliary ductal dilatation. No ascites. Pancreas: Normal appearance of the pancreas Spleen: Normal appearance of the spleen. Note is made of a small splenule. Adrenals/Urinary Tract: There is symmetric enhancement of the bilateral kidneys. Note is made of an approximately 6.7 cm hypoattenuating right-sided renal cyst. Additional left-sided punctate (subcentimeter) left-sided renal lesions are too small to adequately characterize of favored to represent additional renal cysts. No definite renal stones this postcontrast examination. No urine obstruction or perinephric stranding. Normal appearance the bilateral adrenal glands. Normal appearance of the urinary bladder given degree distention. Stomach/Bowel: The approximately 25.0 x 14.7 x 17.3 cm mass centered within the right mid abdominal mesentery is grossly unchanged compared to the 02/18/2017 examination, previously, 25.3 x 16.5 x 18.3 cm. No discrete areas of contrast extravasation are seen associated with the large mesenteric mass to suggest active hemorrhage. A small amount of fluid is seen within the right lower abdomen caudal to the mesenteric mass, similar to the 04/2018 examination. There is mass effect of the mesenteric mass upon the adjacent colon and small bowel, not resulting in enteric obstruction. There are no discrete areas of intraluminal opacification to suggest an active GI bleed. Scattered colonic diverticulosis without evidence of diverticulitis. Normal appearance of the terminal ileum and retrocecal appendix. No  pneumoperitoneum, pneumatosis or portal venous gas. Lymphatic: No bulky retroperitoneal, mesenteric, pelvic or inguinal lymphadenopathy. Reproductive: Appearance of the prostate gland. No free fluid in the pelvic cul-de-sac. Other: Mild diffuse body wall anasarca. Musculoskeletal: No acute or aggressive osseous abnormalities. 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 Mild-to-moderate multilevel lumbar spine DDD, worse at L5-S1 with disc space height loss, endplate irregularity and sclerosis. IMPRESSION: VASCULAR 1. No evidence of GI bleed. Additionally, there is no evidence of active bleeding associated with patient's known large mesenteric mass. 2. Atherosclerotic plaque within a normal caliber abdominal aorta. Aortic Atherosclerosis (ICD10-I70.0). 3. Suspected hemodynamically significant stenoses involving the left common iliac artery as well as the bilateral superficial femoral arteries, right greater than left. NON-VASCULAR 1. Grossly unchanged appearance of previously biopsied spindle-cell sarcoma within the right mid hemiabdomen measuring approximately 25 cm in maximal diameter, similar to the 02/2017 examination. 2. Unchanged approximately 4.3 cm hypoattenuating nonenhancing presumably cystic metastasis within the left lobe of the liver. 3. Cholelithiasis without evidence of cholecystitis. 4. Markedly advanced emphysematous change within the imaged lung bases. Emphysema (ICD10-J43.9). Electronically Signed   By: Sandi Mariscal M.D.   On: 06/09/2018 10:59        Scheduled Meds: . ALPRAZolam  0.5 mg Oral QHS  . budesonide  0.25 mg Nebulization BID  . cholecalciferol  400 Units Oral QHS  . furosemide  40 mg  Oral Daily  . guaiFENesin  1,200 mg Oral BID  . ipratropium-albuterol  3 mL Nebulization QID  . multivitamin  1 tablet Oral QPM  . pantoprazole  40 mg Oral BID AC  . potassium gluconate  595 mg Oral BID  . simvastatin  20 mg Oral q1800  . vitamin B-12  1,000 mcg Oral Daily     Continuous Infusions:   LOS: 3 days     Andrew Poche, MD [facility] 06/10/2018, 12:40 PM Pager: [phone]  If 7PM-7AM, please contact night-coverage [email or web address] 06/10/2018, 12:40 PM

## 2018-06-10 NOTE — Progress Notes (Addendum)
Progress Note   Subjective  Chief Complaint: GI bleed  This morning reports a slow/decrease in his black stools, the last at 5 AM this morning, none since.  Tells me he feels as though he has a sinus infection today with a lot of pressure and did have some nosebleeding for which he was given nasal spray yesterday.  Explains that if he were to "get my breathing better" he would like to proceed with any procedures that were deemed necessary.  Right now he does not feel he would do well with these.    Objective   Vital signs in last 24 hours: Temp:  [98 F (36.7 C)-99.2 F (37.3 C)] 99.2 F (37.3 C) (07/27 0834) Pulse Rate:  [102-114] 110 (07/27 0834) Resp:  [16-24] 20 (07/27 0431) BP: (120-145)/(69-88) 135/69 (07/27 0834) SpO2:  [97 %-100 %] 99 % (07/27 0834) Last BM Date: 06/09/18 General: Ill-appearing white male in NAD Heart:  Regular rate and rhythm; no murmurs Lungs: Respirations even and unlabored, lungs CTA bilaterally + O2 6L via [place] Abdomen:  Soft, nontender and nondistended. Normal bowel sounds. Extremities:  Without edema. Neurologic:  Alert and oriented,  grossly normal neurologically. Psych:  Cooperative. Normal mood and affect.  Intake/Output from previous day: 07/26 0701 - 07/27 0700 In: 860 [P.O.:480; Blood:380] Out: 200 [Urine:200]  Lab Results: Recent Labs    06/09/18 0851 06/09/18 1658 06/10/18 0347  WBC 7.3 8.9 11.0*  HGB 9.0* 8.6* 8.2*  HCT 26.7* 25.3* 23.9*  PLT 161 173 137*   BMET Recent Labs    06/07/18 1631 06/08/18 0449 06/09/18 0851  NA 140 139 135  K 4.3 4.3 4.2  CL 106 106 102  CO2 25 28 26   GLUCOSE 213* 124* 187*  BUN 25* 21 24*  CREATININE 0.90 0.80 0.90  CALCIUM 8.7* 8.2* 8.3*   LFT Recent Labs    06/09/18 0851  PROT 5.9*  ALBUMIN 3.2*  AST 39  ALT 36  ALKPHOS 41  BILITOT 1.4*   PT/INR Recent Labs    06/07/18 1644  LABPROT 14.1  INR 1.10    Studies/Results: Ct Angio Abd/pel W/ And/or W/o  Result Date:  06/09/2018 CLINICAL DATA:  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 Evaluate for GI bleed. EXAM: CTA ABDOMEN AND PELVIS WITH CONTRAST TECHNIQUE: Multidetector CT imaging of the abdomen and pelvis was performed using the standard protocol during bolus administration of intravenous contrast. Multiplanar reconstructed images and MIPs were obtained and reviewed to evaluate the vascular anatomy. CONTRAST:  153mL ISOVUE-370 IOPAMIDOL (ISOVUE-370) INJECTION 76% COMPARISON:  06/07/2018; 05/13/2018; 02/18/2017; ultrasound-guided abdominal mass biopsy - 02/20/2017 FINDINGS: VASCULAR Aorta: Scattered atherosclerotic plaque within a normal caliber abdominal aorta, not resulting in a hemodynamically significant stenosis. Celiac: Minimal calcified atherosclerotic plaque involving the origin the celiac artery, not resulting in a hemodynamically significant stenosis. SMA: Minimal amount of calcified atherosclerotic plaque involving the proximal aspect the main trunk of the SMA, not resulting in a hemodynamically significant stenosis. The distal tributaries the SMA appear widely patent without discrete limb of filling defect to suggest distal embolism. There is a minimal amount of arterial opacification involving the superior medial aspect of the large mesenteric mass without discrete area of vessel irregularity to suggest active extravasation. 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 Minimal amount of calcified atherosclerotic plaque involves the origins of the bilateral dominant renal arteries, left greater than right, not resulting in hemodynamically significant stenosis. No discrete area of vessel  irregularity to suggest FMD. IMA: Widely patent without hemodynamically significant narrowing. Inflow: Calcified atherosclerotic plaque involves the origin of the left common iliac artery resulting in focal severe (at least 70%) luminal narrowing (axial image 112, series 5, coronal  image 110, series 8). Proximal Outflow: Suspected hemodynamically significant narrowings involving the bilateral superficial femoral arteries, right greater than left. Veins: The IVC and pelvic venous system appear widely patent. Review of the MIP images confirms the above findings. NON-VASCULAR Lower chest: Limited visualization of lower thorax demonstrates markedly advanced paraseptal emphysematous change. No focal airspace opacities. No pleural effusion. Normal heart size. Coronary artery calcifications. Note is made of a prominent right infrahilar lymph node measuring 1.2 cm in diameter (image 1, series 5). Hepatobiliary: Normal hepatic contour. Unchanged approximately 4.3 x 2.8 cm hypoattenuating nonenhancing lesion within subcapsular aspect of the lateral segment of the left lobe of the liver. Radiopaque debris in suspected punctate (approximately 0.4 cm) stone is seen within neck of an otherwise normal-appearing gallbladder. No gallbladder wall thickening or pericholecystic fluid. No intra or extrahepatic biliary ductal dilatation. No ascites. Pancreas: Normal appearance of the pancreas Spleen: Normal appearance of the spleen. Note is made of a small splenule. Adrenals/Urinary Tract: There is symmetric enhancement of the bilateral kidneys. Note is made of an approximately 6.7 cm hypoattenuating right-sided renal cyst. Additional left-sided punctate (subcentimeter) left-sided renal lesions are too small to adequately characterize of favored to represent additional renal cysts. No definite renal stones this postcontrast examination. No urine obstruction or perinephric stranding. Normal appearance the bilateral adrenal glands. Normal appearance of the urinary bladder given degree distention. Stomach/Bowel: The approximately 25.0 x 14.7 x 17.3 cm mass centered within the right mid abdominal mesentery is grossly unchanged compared to the 02/18/2017 examination, previously, 25.3 x 16.5 x 18.3 cm. No discrete areas  of contrast extravasation are seen associated with the large mesenteric mass to suggest active hemorrhage. A small amount of fluid is seen within the right lower abdomen caudal to the mesenteric mass, similar to the 04/2018 examination. There is mass effect of the mesenteric mass upon the adjacent colon and small bowel, not resulting in enteric obstruction. There are no discrete areas of intraluminal opacification to suggest an active GI bleed. Scattered colonic diverticulosis without evidence of diverticulitis. Normal appearance of the terminal ileum and retrocecal appendix. No pneumoperitoneum, pneumatosis or portal venous gas. Lymphatic: No bulky retroperitoneal, mesenteric, pelvic or inguinal lymphadenopathy. Reproductive: Appearance of the prostate gland. No free fluid in the pelvic cul-de-sac. Other: Mild diffuse body wall anasarca. Musculoskeletal: No acute or aggressive osseous abnormalities. 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 Mild-to-moderate multilevel lumbar spine DDD, worse at L5-S1 with disc space height loss, endplate irregularity and sclerosis. IMPRESSION: VASCULAR 1. No evidence of GI bleed. Additionally, there is no evidence of active bleeding associated with patient's known large mesenteric mass. 2. Atherosclerotic plaque within a normal caliber abdominal aorta. Aortic Atherosclerosis (ICD10-I70.0). 3. Suspected hemodynamically significant stenoses involving the left common iliac artery as well as the bilateral superficial femoral arteries, right greater than left. NON-VASCULAR 1. Grossly unchanged appearance of previously biopsied spindle-cell sarcoma within the right mid hemiabdomen measuring approximately 25 cm in maximal diameter, similar to the 02/2017 examination. 2. Unchanged approximately 4.3 cm hypoattenuating nonenhancing presumably cystic metastasis within the left lobe of the liver. 3. Cholelithiasis without evidence of cholecystitis. 4. Markedly advanced  emphysematous change within the imaged lung bases. Emphysema (ICD10-J43.9). Electronically Signed   By: Eldridge Abrahams.D.  On: 06/09/2018 10:59       Assessment / Plan:   Assessment: 1.  GI bleed: hgb 8.2 currently, Patient now status post 4 units PRBCs with another one ordered this morning, bleeding has slowed per his report; again question upper GI bleed versus blood from tumor versus diverticulosis, CTA 06/09/2018 unrevealing 2.  Anemia 3.  COPD: On 6 L chronically 4.  Spindle cell sarcoma  Plan: 1.  Continue to monitor hemoglobin and transfusion as needed less than 8 2.  Continue supportive measures 3.  If increased bleeding could consider repeat CTA 4.  Would recommend treatment for sinus infection, currently patient does not feel that with his breathing he could undergo any procedures 5.  Please await any further recommendations from Dr. Loletha Carrow later today  Thank you for your kind consultation, we will continue to follow along   LOS: 3 days   Levin Erp  06/10/2018, 9:05 AM  Pager # [phone]  I have discussed the case with the PA, and that is the plan I formulated. I personally interviewed and examined the patient.  He had another passage of black stool after lunch today. That, combined with ongoing transfusion requirements, indicates he is still having slow GI bleeding of unclear source.  It is most likely somewhere in the small bowel since it is reportedly melena, less likely somewhere in the right colon.  I am most suspicious that his primary tumor has invaded the small bowel causing bleeding.  There is the possibility of a separate source of upper GI bleeding.  His respiratory status is very poor with high chronic oxygen requirements, dyspnea at rest and now possible sinus infection given his complaints of sinus pressure and drainage.  Options are very limited at this point.  He is also still a full code, which I think should be addressed. It does not seem that  a repeat CTA would be useful at this point since his bleeding is reportedly slower than it had been when the last study was done.  The same reason, it seems unlikely a tagged RBC scan would be positive either.  He may yet need an upper endoscopy to rule out a bleeding source there, but if so, this would need to be done after the weekend when we have full anesthesia support and endoscopy staff support given the severity of his respiratory status and significantly increased risk of respiratory weighted complications of the procedure.  He does not seem quite is concerned since he underwent some interventional radiologic procedure to ablate a liver and possibly renal lesion at [facility], but no endoscopic procedure is another matter entirely.  I sent a message to Dr.Nettey of [facility] hospitalist service to see if he might consider empiric therapy for sinusitis or whether antibiotics, decongestants, antihistamines or whatever. We will follow his hemoglobin and hematocrit over the next 24 hours transfuse as needed. Further discussion tomorrow based on those results.  Total 40-minute time, well over half spent in discussion with him and his wife about this difficult scenario and answering their many questions.  Nelida Meuse III Office: [phone]

## 2018-06-10 NOTE — Progress Notes (Addendum)
Placed pt on bipap auto settings with 5L O2 bled in. Placed a small skin like dressing on the bridge of his nose due to redness from wearing bipap last night. Pt states his bipap is adjusted more comfortable tonight.

## 2018-06-11 DIAGNOSIS — E785 Hyperlipidemia, unspecified: Secondary | ICD-10-CM | POA: Diagnosis present

## 2018-06-11 DIAGNOSIS — F419 Anxiety disorder, unspecified: Secondary | ICD-10-CM | POA: Diagnosis present

## 2018-06-11 DIAGNOSIS — C481 Malignant neoplasm of specified parts of peritoneum: Secondary | ICD-10-CM | POA: Diagnosis present

## 2018-06-11 DIAGNOSIS — Z66 Do not resuscitate: Secondary | ICD-10-CM | POA: Diagnosis not present

## 2018-06-11 DIAGNOSIS — D62 Acute posthemorrhagic anemia: Secondary | ICD-10-CM | POA: Diagnosis present

## 2018-06-11 DIAGNOSIS — I5030 Unspecified diastolic (congestive) heart failure: Secondary | ICD-10-CM | POA: Diagnosis not present

## 2018-06-11 DIAGNOSIS — J019 Acute sinusitis, unspecified: Secondary | ICD-10-CM | POA: Diagnosis present

## 2018-06-11 DIAGNOSIS — J9611 Chronic respiratory failure with hypoxia: Secondary | ICD-10-CM | POA: Diagnosis present

## 2018-06-11 DIAGNOSIS — C494 Malignant neoplasm of connective and soft tissue of abdomen: Secondary | ICD-10-CM | POA: Diagnosis not present

## 2018-06-11 DIAGNOSIS — J449 Chronic obstructive pulmonary disease, unspecified: Secondary | ICD-10-CM | POA: Diagnosis present

## 2018-06-11 DIAGNOSIS — E1151 Type 2 diabetes mellitus with diabetic peripheral angiopathy without gangrene: Secondary | ICD-10-CM | POA: Diagnosis present

## 2018-06-11 DIAGNOSIS — C7970 Secondary malignant neoplasm of unspecified adrenal gland: Secondary | ICD-10-CM | POA: Diagnosis not present

## 2018-06-11 DIAGNOSIS — J9612 Chronic respiratory failure with hypercapnia: Secondary | ICD-10-CM | POA: Diagnosis present

## 2018-06-11 DIAGNOSIS — Z7189 Other specified counseling: Secondary | ICD-10-CM | POA: Diagnosis not present

## 2018-06-11 DIAGNOSIS — Z87891 Personal history of nicotine dependence: Secondary | ICD-10-CM | POA: Diagnosis not present

## 2018-06-11 DIAGNOSIS — K5730 Diverticulosis of large intestine without perforation or abscess without bleeding: Secondary | ICD-10-CM | POA: Diagnosis present

## 2018-06-11 DIAGNOSIS — R9431 Abnormal electrocardiogram [ECG] [EKG]: Secondary | ICD-10-CM | POA: Diagnosis not present

## 2018-06-11 DIAGNOSIS — R000 Tachycardia, unspecified: Secondary | ICD-10-CM | POA: Diagnosis not present

## 2018-06-11 DIAGNOSIS — I110 Hypertensive heart disease with heart failure: Secondary | ICD-10-CM | POA: Diagnosis present

## 2018-06-11 DIAGNOSIS — J329 Chronic sinusitis, unspecified: Secondary | ICD-10-CM | POA: Diagnosis not present

## 2018-06-11 DIAGNOSIS — K922 Gastrointestinal hemorrhage, unspecified: Secondary | ICD-10-CM

## 2018-06-11 DIAGNOSIS — M62838 Other muscle spasm: Secondary | ICD-10-CM | POA: Diagnosis present

## 2018-06-11 DIAGNOSIS — Z7982 Long term (current) use of aspirin: Secondary | ICD-10-CM | POA: Diagnosis not present

## 2018-06-11 DIAGNOSIS — Z955 Presence of coronary angioplasty implant and graft: Secondary | ICD-10-CM | POA: Diagnosis not present

## 2018-06-11 DIAGNOSIS — I5032 Chronic diastolic (congestive) heart failure: Secondary | ICD-10-CM | POA: Diagnosis present

## 2018-06-11 DIAGNOSIS — B9689 Other specified bacterial agents as the cause of diseases classified elsewhere: Secondary | ICD-10-CM | POA: Diagnosis not present

## 2018-06-11 DIAGNOSIS — Z9989 Dependence on other enabling machines and devices: Secondary | ICD-10-CM | POA: Diagnosis not present

## 2018-06-11 DIAGNOSIS — I10 Essential (primary) hypertension: Secondary | ICD-10-CM | POA: Diagnosis not present

## 2018-06-11 DIAGNOSIS — C7972 Secondary malignant neoplasm of left adrenal gland: Secondary | ICD-10-CM | POA: Diagnosis present

## 2018-06-11 DIAGNOSIS — D500 Iron deficiency anemia secondary to blood loss (chronic): Secondary | ICD-10-CM | POA: Diagnosis not present

## 2018-06-11 DIAGNOSIS — I25119 Atherosclerotic heart disease of native coronary artery with unspecified angina pectoris: Secondary | ICD-10-CM | POA: Diagnosis not present

## 2018-06-11 DIAGNOSIS — G4733 Obstructive sleep apnea (adult) (pediatric): Secondary | ICD-10-CM | POA: Diagnosis present

## 2018-06-11 DIAGNOSIS — K921 Melena: Secondary | ICD-10-CM | POA: Diagnosis present

## 2018-06-11 DIAGNOSIS — K7689 Other specified diseases of liver: Secondary | ICD-10-CM | POA: Diagnosis not present

## 2018-06-11 DIAGNOSIS — C499 Malignant neoplasm of connective and soft tissue, unspecified: Secondary | ICD-10-CM | POA: Diagnosis present

## 2018-06-11 DIAGNOSIS — C787 Secondary malignant neoplasm of liver and intrahepatic bile duct: Secondary | ICD-10-CM | POA: Diagnosis present

## 2018-06-11 DIAGNOSIS — I2510 Atherosclerotic heart disease of native coronary artery without angina pectoris: Secondary | ICD-10-CM | POA: Diagnosis present

## 2018-06-11 LAB — CBC
HCT: 19.5 % — ABNORMAL LOW (ref 39.0–52.0)
Hemoglobin: 6.7 g/dL — CL (ref 13.0–17.0)
MCH: 30.5 pg (ref 26.0–34.0)
MCHC: 34.4 g/dL (ref 30.0–36.0)
MCV: 88.6 fL (ref 78.0–100.0)
PLATELETS: 124 10*3/uL — AB (ref 150–400)
RBC: 2.2 MIL/uL — ABNORMAL LOW (ref 4.22–5.81)
RDW: 16.1 % — AB (ref 11.5–15.5)
WBC: 5.9 10*3/uL (ref 4.0–10.5)

## 2018-06-11 LAB — PREPARE RBC (CROSSMATCH)

## 2018-06-11 MED ORDER — AMOXICILLIN-POT CLAVULANATE 875-125 MG PO TABS: 1.0000 | ORAL_TABLET | Freq: Two times a day (BID) | ORAL | Status: DC

## 2018-06-11 MED ORDER — ONDANSETRON HCL 4 MG PO TABS: 4.0000 mg | ORAL_TABLET | Freq: Four times a day (QID) | ORAL | Status: DC | PRN

## 2018-06-11 MED ORDER — SODIUM CHLORIDE 0.9% IV SOLUTION: Freq: Once | INTRAVENOUS | Status: DC

## 2018-06-11 MED ORDER — GUAIFENESIN ER 600 MG PO TB12: 1200.0000 mg | ORAL_TABLET | Freq: Two times a day (BID) | ORAL | Status: DC

## 2018-06-11 MED ORDER — ACETAMINOPHEN 325 MG PO TABS: 650.0000 mg | ORAL_TABLET | Freq: Four times a day (QID) | ORAL | Status: DC | PRN

## 2018-06-11 NOTE — Progress Notes (Addendum)
CRITICAL VALUE ALERT  Critical Value:  HGB 6.7  Date & Time Notied:  06/11/18 at 0438  Provider Notified: [place] at 0441am  Orders Received/Actions taken: blood orders received.

## 2018-06-11 NOTE — Progress Notes (Signed)
Transfer report called to [place] at [facility]. Condition stable. [place] transport. Eulas Post, RN

## 2018-06-11 NOTE — Progress Notes (Signed)
PMT progress note  Patient is sitting by the edge of the bed, he is eating lunch, appears to have a full liquid diet which he is tolerating well.  Wife and another family member at bedside.   Patient is thankful for the information and recommendations he has received from GI as well as from [facility] MD earlier today.   Patient states he is awaiting further information about his transfer to [facility].   BP 135/66 (BP Location: Left Arm)   Pulse (!) 102   Temp 98.6 F (37 C) (Oral)   Resp 16   Ht 5\' 6"  (1.676 m)   Wt 103.5 kg (228 lb 1.6 oz)   SpO2 100%   BMI 36.82 kg/m  Labs and imaging noted Currently getting PRBC.  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  Goals/wishes and values re evaluated, in light of his current hospital course Code status discussions also undertaken.   The patient states, "I wish none of this was happening, tell me about it." He states that he was on the ventilator for a short period of time last month at [facility].   Goals are not comfort focused at this time.  Patient desires full code/full scope in the event of resp failure or cardiac arrest.   Wife becomes tearful, I did bring up the fact that if the patient ends up on ventilatory support, there exists a possibility that he might not be able to be extubated due to his serious COPD and ongoing GIB and malignancy. At that time, family would have to make decisions such as compassionate liberation from ventilator/comfort care. Wife is aware.   25 minutes spent. Loistine Chance MD [place] palliative care team [phone]

## 2018-06-11 NOTE — Progress Notes (Signed)
I discussed Mr. Santoro case with Dr. Percell Belt of the [facility] service, and he agrees it would be best for patient to transfer to their facility for endoscopic procedures and consultation with his primary oncologist.  They requested the most recent CT scan abdomen put on disc to accompany patient.  I spoke to Dr. Lonny Prude about this, and he will call [facility] transfer line.The patient will go to either the internal medicine or possibly oncology service at [facility].  I changed his diet to full liquid so he can have a good bowel preparation for a procedure that will probably be Tuesday. Continue supportive care monitoring blood counts and transfusion as needed until transfer occurs. We will continue to follow him while he is here.  Dr. Havery Moros will assume the consult service tomorrow.

## 2018-06-11 NOTE — Discharge Summary (Addendum)
Physician Discharge Summary  [name] TKW:[id number] DOB: 1946/11/03 DOA: 06/07/2018  PCP: [name], MD  Admit date: 06/07/2018 Discharge date: 06/11/2018  Admitted From: Home Disposition: [facility]  Discharge Condition: Guarded CODE STATUS: Full code Diet recommendation: Full liquid   Brief/Interim Summary:  Admission HPI written by Etta Quill, DO    Chief Complaint: Rectal bleeding  HPI: [name] is a 72 y.o. 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  Patient recently admitted from 6/29 to 7/4 for LGIB, diverticular vs due to the big necrotic mesenteric mass in abdomen.  Required multiple (9 per patient report) units of PRBC transfusion during admission.  Felt not to be great candidate for endoscopy due to severe baseline COPD.  Patient returns to ED with recurrent dark stools becoming BRBPR over the past day or so.  HGB in office yesterday noted to be 7.9 down from 10.8 on 7/4 discharge.   ED Course: HGB 7.0 today in ED.  Has BRBPR on rectal exam, heme positive of course.  Thankfully appears to have stable vitals this time without hypotension (unlike last admit).  2u PRBC transfusion ordered and first unit is going.  CT w contrast of Abd/pelvis again shows large mass, mets, no perforation or acute findings.    Hospital course:  GI bleeding Thought to be diverticular however patient does take ibuprofen as an outpatient. Continuing to bleed. Has required 8 units of PRBC to date (including pending transfusions today). GI consulted on admission. CTA abdomen/pelvis x2 unhelpful in localizing bleeding. Melanotic stools persistent through admission. Patient with mild tachycardia, but otherwise stable vitals. Hemoglobin of 6.7 today and given 2 units of PRBC. Discussed high risk of endoscopy procedure in setting of patient's  chronic respiratory disease. GI recommended transfer to [facility] for high risk endoscopy in addition to availability of patient's primary radiology and oncology team. Palliative care consulted and discussed goals of care with patient and his wife. Plan is for continued full scope medical care.  Near syncope In setting of acute blood loss. Transient. No other symptoms.  Chronic diastolic heart failure Asymptomatic. Lisinopril held. Continued lasix.  OSA Bipap qhs  Spindle cell sarcoma Stable. Outpatient follow-up.  COPD Chronic respiratory failure with hypoxia and hypercapnia On chronic oxygen. Stable. 6L at baseline. Continued oxygen therapy  CAD Remote history of stent placement. Lisinopril, Imdur held secondary to acute GI bleeding. Continued Simvastatin  Hyperkalemia Mild. Resoled.  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 Recommend outpatient vascular follow-up    Discharge Diagnoses:  Principal Problem:   Lower GI bleed Active Problems:   Essential hypertension   COPD mixed type (HCC)   BiPAP (biphasic positive airway pressure) dependence   Spindle cell sarcoma (HCC)   Acute blood loss anemia    Discharge Instructions   Allergies as of 06/11/2018      Reactions   Amlodipine Swelling      Medication List    STOP taking these medications   aspirin 81 MG tablet   FLAX SEED OIL PO   Garlic 9242 MG Caps   isosorbide mononitrate 30 MG 24 hr tablet Commonly known as:  IMDUR   lisinopril 10 MG tablet Commonly known as:  PRINIVIL,ZESTRIL   vitamin C 1000 MG tablet     TAKE these medications   acetaminophen 325 MG tablet  Commonly known as:  TYLENOL Take 2 tablets (650 mg total) by mouth every 6 (six) hours as needed for mild pain (or Fever >/= 101).   ALPRAZolam 0.5 MG tablet Commonly known as:  XANAX Take 0.5 mg by mouth at bedtime.   amoxicillin-clavulanate 875-125 MG  tablet Commonly known as:  AUGMENTIN Take 1 tablet by mouth 2 (two) times daily.   budesonide 0.25 MG/2ML nebulizer solution Commonly known as:  PULMICORT Take 2 mLs (0.25 mg total) by nebulization 2 (two) times daily.   cholecalciferol 400 units Tabs tablet Commonly known as:  VITAMIN D Take 400 Units by mouth at bedtime.   furosemide 40 MG tablet Commonly known as:  LASIX Take 40 mg by mouth daily.   guaiFENesin 600 MG 12 hr tablet Commonly known as:  MUCINEX Take 2 tablets (1,200 mg total) by mouth 2 (two) times daily.   ipratropium-albuterol 0.5-2.5 (3) MG/3ML Soln Commonly known as:  DUONEB Take 3 mLs by nebulization 4 (four) times daily.   methocarbamol 500 MG tablet Commonly known as:  ROBAXIN TAKE 1 TO 2 TABLETS BY MOUTH TWICE A DAY AS NEEDED FOR MUSCLE CRAMPS   multivitamin-lutein Caps capsule Take 1 capsule by mouth every evening.   nitroGLYCERIN 0.4 MG SL tablet Commonly known as:  NITROSTAT Place 1 tablet (0.4 mg total) under the tongue every 5 (five) minutes as needed for chest pain.   ondansetron 4 MG tablet Commonly known as:  ZOFRAN Take 1 tablet (4 mg total) by mouth every 6 (six) hours as needed for nausea.   pantoprazole 40 MG tablet Commonly known as:  PROTONIX Take 1 tablet (40 mg total) by mouth daily.   Potassium 99 MG Tabs Take 1 tablet by mouth 2 (two) times daily.   PROAIR RESPICLICK 924 (90 Base) MCG/ACT Aepb Generic drug:  Albuterol Sulfate Inhale 2 puffs into the lungs every 6 (six) hours as needed (shortness of breath).   simvastatin 20 MG tablet Commonly known as:  ZOCOR Take 1 tablet (20 mg total) by mouth daily at 6 PM.   sodium chloride 0.65 % Soln nasal spray Commonly known as:  OCEAN Place 1 spray into both nostrils as needed for congestion.   SYSTANE OP Apply 1 drop to eye daily as needed (dry eyes).   vitamin B-12 1000 MCG tablet Commonly known as:  CYANOCOBALAMIN Take 1,000 mcg by mouth daily.       Allergies   Allergen Reactions  . Amlodipine Swelling    Consultations:  Gastroenterology  Palliative care medicine   Procedures/Studies: Ct Abdomen Pelvis W Contrast  Result Date: 06/07/2018 CLINICAL DATA:  GI bleed, tarry stools beginning yesterday. History of bladder cancer, hyperplastic colonic polyps, mesenteric spindle cell carcinoma. EXAM: CT ABDOMEN AND PELVIS WITH CONTRAST TECHNIQUE: Multidetector CT imaging of the abdomen and pelvis was performed using the standard protocol following bolus administration of intravenous contrast. CONTRAST:  125mL ISOVUE-300 IOPAMIDOL (ISOVUE-300) INJECTION 61% COMPARISON:  CT abdomen and pelvis May 14, 2018. FINDINGS: LOWER CHEST: Severe centrilobular emphysema. Included heart size is normal. Severe coronary artery calcifications. HEPATOBILIARY: Stable 4.5 cm hypodense mass LEFT lobe of the liver. Mild hepatomegaly. Minimal layering gallbladder sludge versus tiny stones without CT findings of acute cholecystitis. PANCREAS: Mildly atrophic, nonacute. SPLEEN: Normal. ADRENALS/URINARY TRACT: Kidneys are orthotopic, demonstrating symmetric enhancement. 2 mm nonobstructing RIGHT nephrolithiasis. No hydronephrosis or solid renal masses. Stable diffusely hypodense 6.9 cm RIGHT interpolar benign-appearing cyst. Too small to characterize hypodensities LEFT kidneys. The unopacified ureters are normal in  course and caliber. Delayed imaging through the kidneys demonstrates symmetric prompt contrast excretion within the proximal urinary collecting system. Urinary bladder is decompressed and unremarkable. Stable 12 mm LEFT adrenal nodule. STOMACH/BOWEL: The stomach, small and large bowel are normal in course and caliber without inflammatory changes, sensitivity decreased without oral contrast. Severe sigmoid colonic diverticulosis. Scattered a additional colonic diverticula. Normal appendix. VASCULAR/LYMPHATIC: Ectatic infrarenal aorta without aneurysm. Severe calcific  atherosclerosis. No lymphadenopathy by CT size criteria. REPRODUCTIVE: Normal. OTHER: At least 25 cm stable lobulated solid mass RIGHT lower quadrant displacing the adjacent bowel. No intraperitoneal free fluid or free air. Granulomas in the pelvis. MUSCULOSKELETAL: Nonacute. Severe L5-S1 disc height loss and endplate spurring resulting in moderate to severe neural foraminal narrowing. IMPRESSION: 1. Colonic diverticulosis without acute diverticulitis nor acute intra-abdominal/pelvic process. 2. Stable at least 25 cm RIGHT lower mesenteric mass previously reported a spindle cell cancer. 3. Stable hepatic and LEFT adrenal masses previously reported is suspected metastasis. 4. 2 mm nonobstructing RIGHT nephrolithiasis. Emphysema (ICD10-J43.9).  Aortic Atherosclerosis (ICD10-I70.0). Electronically Signed   By: Elon Alas M.D.   On: 06/07/2018 18:20   Ct Abdomen Pelvis W Contrast  Result Date: 05/13/2018 CLINICAL DATA:  Bright red rectal bleeding this morning for 30 minutes. History of bladder neoplasm. EXAM: CT ABDOMEN AND PELVIS WITH CONTRAST TECHNIQUE: Multidetector CT imaging of the abdomen and pelvis was performed using the standard protocol following bolus administration of intravenous contrast. CONTRAST:  153mL ISOVUE-300 IOPAMIDOL (ISOVUE-300) INJECTION 61% COMPARISON:  02/18/2017 and 07/02/2010 FINDINGS: Lower chest: Heart size is normal without pericardial effusion or thickening. Moderate centrilobular and paraseptal emphysema at the lung bases. There is coronary arteriosclerosis visualized along the included RCA. Hepatobiliary: Steatosis of the liver. Stable 4.7 x 3.5 x 3.6 cm hypodense lesion of the left hepatic lobe unchanged from prior 02/18/2017 study but new since 2011. Repeat delayed imaging demonstrates no significant enhancement. No biliary dilatation is identified. The gallbladder contains tiny layering calculi near the neck. No secondary signs of acute cholecystitis. Pancreas: Pancreas is  slightly fatty in appearance without ductal dilatation or inflammation. Spleen: Normal size spleen without mass. Adrenals/Urinary Tract: Indeterminate hypodense nodule of the posterior limb of the left adrenal gland measuring 15 x 12 mm, not apparent on prior studies and may be new. Hypodense simple cyst of the mid to upper right kidney measuring 6.8 cm in diameter is stable. No enhancing mass lesions of the kidneys. No nephrolithiasis nor hydroureteronephrosis. The urinary bladder is decompressed without focal mural thickening or enhancement. Stomach/Bowel: Nondistended stomach. Redemonstration of heterogeneously enhancing partially necrotic appearing mass centered within the right abdominal mesentery measuring approximately 19.6 x 17.6 x 16.1 cm in transverse by craniocaudad by AP dimension, relatively stable having previously measured 25.3 x 16.5 x 17.8 cm in 2018. No bowel obstruction or inflammation is noted. Descending and sigmoid diverticulosis is identified without acute diverticulitis. Vascular/Lymphatic: Atherosclerotic nonaneurysmal abdominal aorta with patent portal, splenic, hepatic and renal veins. No pathologically enlarged lymph nodes. Reproductive: Top-normal size prostate. Other: No pneumoperitoneum.  No ascites. Musculoskeletal: Thoracolumbar spondylosis without aggressive osseous lesions. IMPRESSION: 1. Descending and sigmoid diverticulosis without acute diverticulitis. The diverticulosis may be contributing to the lesions of the rectum. No definite annular constricting lesions are visualized. 2. Stable heterogeneous partially necrotic appearing mass centered within the mesentery of the right hemiabdomen currently estimated at the 19.6 x 17.6 x 16 1 cm. 3. Nonenhancing hypodense lesions possible a complex cyst or hemangioma, stable since 2018 currently estimated at 4.7 x 3.5 x 3.6  cm. Further correlation could performed with MRI to better evaluate as this is not apparent in 2011 and to exclude  the possibility of a metastatic focus. Electronically Signed   By: Ashley Royalty M.D.   On: 05/13/2018 18:16   Ct Angio Abd/pel W/ And/or W/o  Result Date: 06/09/2018 CLINICAL DATA:  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 Evaluate for GI bleed. EXAM: CTA ABDOMEN AND PELVIS WITH CONTRAST TECHNIQUE: Multidetector CT imaging of the abdomen and pelvis was performed using the standard protocol during bolus administration of intravenous contrast. Multiplanar reconstructed images and MIPs were obtained and reviewed to evaluate the vascular anatomy. CONTRAST:  163mL ISOVUE-370 IOPAMIDOL (ISOVUE-370) INJECTION 76% COMPARISON:  06/07/2018; 05/13/2018; 02/18/2017; ultrasound-guided abdominal mass biopsy - 02/20/2017 FINDINGS: VASCULAR Aorta: Scattered atherosclerotic plaque within a normal caliber abdominal aorta, not resulting in a hemodynamically significant stenosis. Celiac: Minimal calcified atherosclerotic plaque involving the origin the celiac artery, not resulting in a hemodynamically significant stenosis. SMA: Minimal amount of calcified atherosclerotic plaque involving the proximal aspect the main trunk of the SMA, not resulting in a hemodynamically significant stenosis. The distal tributaries the SMA appear widely patent without discrete limb of filling defect to suggest distal embolism. There is a minimal amount of arterial opacification involving the superior medial aspect of the large mesenteric mass without discrete area of vessel irregularity to suggest active extravasation. 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 Minimal amount of calcified atherosclerotic plaque involves the origins of the bilateral dominant renal arteries, left greater than right, not resulting in hemodynamically significant stenosis. No discrete area of vessel irregularity to suggest FMD. IMA: Widely patent without hemodynamically significant narrowing. Inflow: Calcified  atherosclerotic plaque involves the origin of the left common iliac artery resulting in focal severe (at least 70%) luminal narrowing (axial image 112, series 5, coronal image 110, series 8). Proximal Outflow: Suspected hemodynamically significant narrowings involving the bilateral superficial femoral arteries, right greater than left. Veins: The IVC and pelvic venous system appear widely patent. Review of the MIP images confirms the above findings. NON-VASCULAR Lower chest: Limited visualization of lower thorax demonstrates markedly advanced paraseptal emphysematous change. No focal airspace opacities. No pleural effusion. Normal heart size. Coronary artery calcifications. Note is made of a prominent right infrahilar lymph node measuring 1.2 cm in diameter (image 1, series 5). Hepatobiliary: Normal hepatic contour. Unchanged approximately 4.3 x 2.8 cm hypoattenuating nonenhancing lesion within subcapsular aspect of the lateral segment of the left lobe of the liver. Radiopaque debris in suspected punctate (approximately 0.4 cm) stone is seen within neck of an otherwise normal-appearing gallbladder. No gallbladder wall thickening or pericholecystic fluid. No intra or extrahepatic biliary ductal dilatation. No ascites. Pancreas: Normal appearance of the pancreas Spleen: Normal appearance of the spleen. Note is made of a small splenule. Adrenals/Urinary Tract: There is symmetric enhancement of the bilateral kidneys. Note is made of an approximately 6.7 cm hypoattenuating right-sided renal cyst. Additional left-sided punctate (subcentimeter) left-sided renal lesions are too small to adequately characterize of favored to represent additional renal cysts. No definite renal stones this postcontrast examination. No urine obstruction or perinephric stranding. Normal appearance the bilateral adrenal glands. Normal appearance of the urinary bladder given degree distention. Stomach/Bowel: The approximately 25.0 x 14.7 x 17.3 cm  mass centered within the right mid abdominal mesentery is grossly unchanged compared to the 02/18/2017 examination, previously, 25.3 x 16.5 x 18.3 cm. No discrete areas of contrast extravasation are seen associated with the large mesenteric mass to  suggest active hemorrhage. A small amount of fluid is seen within the right lower abdomen caudal to the mesenteric mass, similar to the 04/2018 examination. There is mass effect of the mesenteric mass upon the adjacent colon and small bowel, not resulting in enteric obstruction. There are no discrete areas of intraluminal opacification to suggest an active GI bleed. Scattered colonic diverticulosis without evidence of diverticulitis. Normal appearance of the terminal ileum and retrocecal appendix. No pneumoperitoneum, pneumatosis or portal venous gas. Lymphatic: No bulky retroperitoneal, mesenteric, pelvic or inguinal lymphadenopathy. Reproductive: Appearance of the prostate gland. No free fluid in the pelvic cul-de-sac. Other: Mild diffuse body wall anasarca. Musculoskeletal: No acute or aggressive osseous abnormalities. 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 Mild-to-moderate multilevel lumbar spine DDD, worse at L5-S1 with disc space height loss, endplate irregularity and sclerosis. IMPRESSION: VASCULAR 1. No evidence of GI bleed. Additionally, there is no evidence of active bleeding associated with patient's known large mesenteric mass. 2. Atherosclerotic plaque within a normal caliber abdominal aorta. Aortic Atherosclerosis (ICD10-I70.0). 3. Suspected hemodynamically significant stenoses involving the left common iliac artery as well as the bilateral superficial femoral arteries, right greater than left. NON-VASCULAR 1. Grossly unchanged appearance of previously biopsied spindle-cell sarcoma within the right mid hemiabdomen measuring approximately 25 cm in maximal diameter, similar to the 02/2017 examination. 2. Unchanged approximately 4.3 cm  hypoattenuating nonenhancing presumably cystic metastasis within the left lobe of the liver. 3. Cholelithiasis without evidence of cholecystitis. 4. Markedly advanced emphysematous change within the imaged lung bases. Emphysema (ICD10-J43.9). Electronically Signed   By: Sandi Mariscal M.D.   On: 06/09/2018 10:59   Ct Angio Abd/pel W/ And/or W/o  Result Date: 05/14/2018 CLINICAL DATA:  GI bleeding with bloody stool EXAM: CTA ABDOMEN AND PELVIS wITHOUT AND WITH CONTRAST TECHNIQUE: Multidetector CT imaging of the abdomen and pelvis was performed using the standard protocol during bolus administration of intravenous contrast. Multiplanar reconstructed images and MIPs were obtained and reviewed to evaluate the vascular anatomy. CONTRAST:  135mL ISOVUE-370 IOPAMIDOL (ISOVUE-370) INJECTION 76% COMPARISON:  CT from yesterday FINDINGS: VASCULAR Aorta: Atheromatous plaque. Undulating contour without aneurysm or dissection. Celiac: Atheromatous plaque at the origin with mild-to-moderate narrowing. No branch occlusion or aneurysm SMA: Atherosclerosis but widely patent. Renals: Bilateral accessory renal arteries. Atherosclerotic plaque narrows bilateral renal arteries without asymmetric enhancement IMA: Small but patent vessel Inflow: Diffuse atherosclerotic calcification. Advanced narrowing at the origin of the left common iliac due to a calcified plaque Proximal Outflow: Extensive atherosclerotic plaque on the bilateral common femoral and superficial femoral arteries with at least moderate narrowing. Veins: Patent Review of the MIP images confirms the above findings. NON-VASCULAR Lower chest: Coronary atherosclerosis.  Emphysema. Hepatobiliary: Low-density subcapsular mass in the left liver measuring up to 55 mm, suspected metastasis by outside imaging reports that [facility] [place].Cholelithiasis. Pancreas: Unremarkable. Spleen: Unremarkable. Adrenals/Urinary Tract: 11 mm left adrenal nodule, known from outside imaging reports.  Simple right renal cyst. No hydronephrosis or stone. Unremarkable bladder. Stomach/Bowel: Extensive sigmoid diverticulosis. Milder diverticulosis seen elsewhere. No visible intraluminal active hemorrhage; high-density contents at the rectum does not change between the phases and is not associated with visible abnormal vessels. 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 Lymphatic: No enlarged lymph nodes Reproductive:Negative Other: No ascites or pneumoperitoneum. Musculoskeletal: Degenerative changes without acute or aggressive finding. IMPRESSION: 1. No specific explanation for GI bleeding. No evidence of active hemorrhage. 2. Extensive  distal colonic diverticulosis. 3. 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 No visible bowel fistulization to explain the presentation. 4. Left hepatic and left adrenal nodules, suspected metastases based on outside imaging reports. 5. Cholelithiasis. Electronically Signed   By: Monte Fantasia M.D.   On: 05/14/2018 11:27      Subjective: Multiple melanotic stools overnight.  Discharge Exam: Vitals:   06/11/18 1148 06/11/18 1430  BP: 135/66 121/71  Pulse: (!) 102 (!) 101  Resp: 16 20  Temp: 98.6 F (37 C) 98.8 F (37.1 C)  SpO2: 100% 100%   Vitals:   06/11/18 1057 06/11/18 1140 06/11/18 1148 06/11/18 1430  BP: 120/74 135/66 135/66 121/71  Pulse: (!) 102 (!) 102 (!) 102 (!) 101  Resp: 16 16 16 20   Temp: 98.7 F (37.1 C) 98.6 F (37 C) 98.6 F (37 C) 98.8 F (37.1 C)  TempSrc:  Oral Oral Oral  SpO2: 100% 100% 100% 100%  Weight:      Height:        General: Pt is alert, awake, not in acute distress Cardiovascular: RRR, S1/S2 +, no rubs, no gallops Respiratory: CTA bilaterally, no wheezing, no rhonchi Abdominal: Soft, NT, Distended, abdominal mass felt, bowel sounds + Extremities: 2+ LE edema, no cyanosis    The results of  significant diagnostics from this hospitalization (including imaging, microbiology, ancillary and laboratory) are listed below for reference.     Labs: Basic Metabolic Panel: Recent Labs  Lab 06/07/18 1631 06/08/18 0449 06/09/18 0851  NA 140 139 135  K 4.3 4.3 4.2  CL 106 106 102  CO2 25 28 26   GLUCOSE 213* 124* 187*  BUN 25* 21 24*  CREATININE 0.90 0.80 0.90  CALCIUM 8.7* 8.2* 8.3*   Liver Function Tests: Recent Labs  Lab 06/07/18 1631 06/09/18 0851  AST 34 39  ALT 37 36  ALKPHOS 42 41  BILITOT 1.5* 1.4*  PROT 6.2* 5.9*  ALBUMIN 3.4* 3.2*   Recent Labs  Lab 06/07/18 1644  LIPASE 29   No results for input(s): AMMONIA in the last 168 hours. CBC: Recent Labs  Lab 06/06/18 1407  06/09/18 0851 06/09/18 1658 06/10/18 0347 06/10/18 1732 06/11/18 0337  WBC 5.2   < > 7.3 8.9 11.0* 10.7* 5.9  NEUTROABS 4.3  --   --   --   --   --   --   HGB 7.9 Repeated and verified X2.*   < > 9.0* 8.6* 8.2* 8.3* 6.7*  HCT 22.9 Repeated and verified X2.*   < > 26.7* 25.3* 23.9* 24.3* 19.5*  MCV 93.3   < > 90.8 90.7 89.5 90.0 88.6  PLT 192.0   < > 161 173 137* 130* 124*   < > = values in this interval not displayed.   Urinalysis    Component Value Date/Time   COLORURINE YELLOW 01/19/2017 1739   APPEARANCEUR CLEAR 01/19/2017 1739   LABSPEC 1.023 01/19/2017 1739   PHURINE 5.0 01/19/2017 1739   GLUCOSEU NEGATIVE 01/19/2017 1739   GLUCOSEU NEGATIVE 06/30/2010 1050   HGBUR NEGATIVE 01/19/2017 1739   BILIRUBINUR NEGATIVE 01/19/2017 1739   KETONESUR 5 (A) 01/19/2017 1739   PROTEINUR 30 (A) 01/19/2017 1739   UROBILINOGEN 0.2 06/30/2010 1050   NITRITE NEGATIVE 01/19/2017 1739   LEUKOCYTESUR NEGATIVE 01/19/2017 1739    SIGNED:   Cordelia Poche, MD [facility] 06/11/2018, 2:50 PM

## 2018-06-11 NOTE — Progress Notes (Signed)
Care link contacted regarding patient transport to [facility]. Care link request transport to be arranged with [facility] [phone]. Transport to arrive close to 7pm.

## 2018-06-11 NOTE — Progress Notes (Signed)
[facility] Progress Note  Chief Complaint: Melena  History:  [name] continues to pass melena yesterday and today.  His hemoglobin continues to drop despite transfusion as well.  He has chronic abdominal pain that is more in the right hemiabdomen, and it is unchanged from yesterday.  His appetite is fair, but he is tolerating solid food.  He still has sinus pressure and postnasal drip and last evening was started on Augmentin.  He has chronic dyspnea and a productive cough with this postnasal drip and he denies chest pain or dizziness.  ROS: Generalized fatigue Major systems negative except as above  Objective:  Med list reviewed No current facility-administered medications on file prior to encounter.    Current Outpatient Medications on File Prior to Encounter  Medication Sig Dispense Refill  . ALPRAZolam (XANAX) 0.5 MG tablet Take 0.5 mg by mouth at bedtime.     . Ascorbic Acid (VITAMIN C) 1000 MG tablet Take 1,000 mg by mouth every evening.     [name] aspirin 81 MG tablet Take 81 mg by mouth at bedtime.     . budesonide (PULMICORT) 0.25 MG/2ML nebulizer solution Take 2 mLs (0.25 mg total) by nebulization 2 (two) times daily. 120 mL 11  . cholecalciferol (VITAMIN D) 400 units TABS tablet Take 400 Units by mouth at bedtime.    . Flaxseed, Linseed, (FLAX SEED OIL PO) Take 1,400 mg by mouth daily.    . furosemide (LASIX) 40 MG tablet Take 40 mg by mouth daily.     . Garlic 2409 MG CAPS Take 1,000 mg by mouth every evening.     [name] ipratropium-albuterol (DUONEB) 0.5-2.5 (3) MG/3ML SOLN Take 3 mLs by nebulization 4 (four) times daily. 360 mL 11  . isosorbide mononitrate (IMDUR) 30 MG 24 hr tablet Take 1 tablet (30 mg total) by mouth daily. 30 tablet 11  . lisinopril (PRINIVIL,ZESTRIL) 10 MG tablet Take 10 mg by mouth daily.    . methocarbamol (ROBAXIN) 500 MG tablet TAKE 1 TO 2 TABLETS BY MOUTH TWICE A DAY AS NEEDED FOR MUSCLE CRAMPS  1  . multivitamin-lutein (OCUVITE-LUTEIN) CAPS capsule  Take 1 capsule by mouth every evening.     . pantoprazole (PROTONIX) 40 MG tablet Take 1 tablet (40 mg total) by mouth daily. 30 tablet 0  . Potassium 99 MG TABS Take 1 tablet by mouth 2 (two) times daily.    . simvastatin (ZOCOR) 20 MG tablet Take 1 tablet (20 mg total) by mouth daily at 6 PM. 30 tablet 2  . vitamin B-12 (CYANOCOBALAMIN) 1000 MCG tablet Take 1,000 mcg by mouth daily.    . Albuterol Sulfate (PROAIR RESPICLICK) 735 (90 Base) MCG/ACT AEPB Inhale 2 puffs into the lungs every 6 (six) hours as needed (shortness of breath).    . nitroGLYCERIN (NITROSTAT) 0.4 MG SL tablet Place 1 tablet (0.4 mg total) under the tongue every 5 (five) minutes as needed for chest pain. 25 tablet 3  . Polyethyl Glycol-Propyl Glycol (SYSTANE OP) Apply 1 drop to eye daily as needed (dry eyes).    . sodium chloride (OCEAN) 0.65 % SOLN nasal spray Place 1 spray into both nostrils as needed for congestion.  0    Vital signs in last 24 hrs: Vitals:   06/11/18 0815 06/11/18 0842  BP: 116/69   Pulse: (!) 105   Resp: 19   Temp: 98.7 F (37.1 C)   SpO2: 99% 99%    Physical Exam Chronically ill man as before.  He  is dyspneic at rest on supplemental oxygen.  He is able to speak full sentences, he is alert, conversational, normal affect and sitting at the bedside with his wife present.  HEENT: sclera anicteric, oral mucosa moist without lesions  Neck: supple, no thyromegaly, JVD or lymphadenopathy  Cardiac: RRR without murmurs, S1S2 heard, no peripheral edema  Pulm: Generally decreased breath sounds bilaterally respiratory rate increased at about 20  Abdomen: soft, mid to right abdominal tenderness as before with a large palpable firm mass.  Obese, active bowel sounds  Skin; warm and dry, no jaundice or rash.  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  Recent interventional radiology treatment of hepatic and renal lesion by patient report.  Persistent melena with anemia of acute GI blood loss.  Recent admission for same.  He had 9 units of blood that admission and thus far has received 6 units on this admission and will now require more because of his persistent severe anemia.  Severe oxygen dependent COPD  [name] continues to have slow steady GI bleeding of unknown source.  It has been black stool, suggesting a small bowel or perhaps right colon source.  I still suspect that the tumor has invaded the bowel and bleeding into the lumen.  Less likely considerations seem to be a separate ulcer or AVM source in the small bowel or right colon AVM or diverticular bleed.  After extensive consideration since I first saw him yesterday, I have come to some conclusions which I discussed with [name] and his wife today.  #1 -he has a number of very serious acute and chronic medical problems #2 -despite that, he is not currently a hospice/CMO patient. #3 -the bleeding is ongoing and we must find a source know what, if anything, can be done about it #4 -Any endoscopic procedures would be high risk.  [name] clearly understands that at this point, and wishes to be full code # 5 -if endoscopic procedures are to be done, he should have both upper endoscopy and colonoscopy. #6 - neither his care team here nor the patient and his wife know long-term prognosis of his malignancy.  Considering all that, and especially #6, and that his primary oncology team not here, I think strong consideration should be given to transferring [name] to [facility] for his endoscopic procedures along with  consultation of his primary oncologic team.  [name] is agreeable to to doing so if that is felt to be wise and feasible by the accepting team at [facility].  I have spoken to his hospitalist Dr. Lonny Prude, who is agreeable to contacting the transfer team and discuss the case.  If he is not accepted for transfer, either because it is not felt to be wise for some reason or there is no bed availability, then he will need his endoscopic procedures done here.  He understands that no matter where they are performed, it would be a high risk situation with uncertain benefit.  That said, he he feels strongly about finding out the source of bleeding if possible and what his options are.  He closes by saying, "if it is my time, I will accept that".   Total time 45 minutes, all of which spent face-to-face with patient and his wife.  Nelida Meuse III Office: [phone]

## 2018-06-11 NOTE — Progress Notes (Signed)
Call received from bed Coordinator from [facility], [place], [place]. [place] Call report to [phone] or CN phone [phone].  Will update Primary RN with transfer details, Provider contacted to complete EMTALA.

## 2018-06-12 LAB — TYPE AND SCREEN
ABO/RH(D): A POS
ABO/RH(D): A POS
ANTIBODY SCREEN: NEGATIVE
ANTIBODY SCREEN: NEGATIVE
UNIT DIVISION: 0
UNIT DIVISION: 0
Unit division: 0
Unit division: 0
Unit division: 0
Unit division: 0
Unit division: 0
Unit division: 0

## 2018-06-12 LAB — BPAM RBC
BLOOD PRODUCT EXPIRATION DATE: 201908112359
BLOOD PRODUCT EXPIRATION DATE: 201908112359
BLOOD PRODUCT EXPIRATION DATE: 201908132359
BLOOD PRODUCT EXPIRATION DATE: 201908182359
BLOOD PRODUCT EXPIRATION DATE: 201908212359
Blood Product Expiration Date: 201908112359
Blood Product Expiration Date: 201908122359
Blood Product Expiration Date: 201908212359
ISSUE DATE / TIME: 201907241909
ISSUE DATE / TIME: 201907242249
ISSUE DATE / TIME: 201907251311
ISSUE DATE / TIME: 201907251552
ISSUE DATE / TIME: 201907262149
ISSUE DATE / TIME: 201907270847
ISSUE DATE / TIME: 201907281116
ISSUE DATE / TIME: 201907281458
UNIT TYPE AND RH: 6200
UNIT TYPE AND RH: 6200
UNIT TYPE AND RH: 6200
UNIT TYPE AND RH: 6200
UNIT TYPE AND RH: 6200
Unit Type and Rh: 6200
Unit Type and Rh: 6200
Unit Type and Rh: 6200

## 2018-06-14 ENCOUNTER — Encounter (HOSPITAL_COMMUNITY): Payer: Medicare Other

## 2018-06-21 ENCOUNTER — Telehealth: Payer: Self-pay

## 2018-06-21 NOTE — Telephone Encounter (Signed)
Phone call placed to patient to schedule visit with Palliative Care. Visit scheduled for Monday 06/26/18. Address verified.

## 2018-06-22 DIAGNOSIS — F418 Other specified anxiety disorders: Secondary | ICD-10-CM | POA: Diagnosis not present

## 2018-06-22 DIAGNOSIS — Z9981 Dependence on supplemental oxygen: Secondary | ICD-10-CM | POA: Diagnosis not present

## 2018-06-22 DIAGNOSIS — G4733 Obstructive sleep apnea (adult) (pediatric): Secondary | ICD-10-CM | POA: Diagnosis not present

## 2018-06-22 DIAGNOSIS — R0982 Postnasal drip: Secondary | ICD-10-CM | POA: Diagnosis not present

## 2018-06-22 DIAGNOSIS — D62 Acute posthemorrhagic anemia: Secondary | ICD-10-CM | POA: Diagnosis not present

## 2018-06-22 DIAGNOSIS — Z6836 Body mass index (BMI) 36.0-36.9, adult: Secondary | ICD-10-CM | POA: Diagnosis not present

## 2018-06-22 DIAGNOSIS — C499 Malignant neoplasm of connective and soft tissue, unspecified: Secondary | ICD-10-CM | POA: Diagnosis not present

## 2018-06-22 DIAGNOSIS — I10 Essential (primary) hypertension: Secondary | ICD-10-CM | POA: Diagnosis not present

## 2018-06-22 DIAGNOSIS — Z7189 Other specified counseling: Secondary | ICD-10-CM | POA: Diagnosis not present

## 2018-06-22 DIAGNOSIS — M6281 Muscle weakness (generalized): Secondary | ICD-10-CM | POA: Diagnosis not present

## 2018-06-26 ENCOUNTER — Other Ambulatory Visit: Payer: Medicare Other | Admitting: Hospice and Palliative Medicine

## 2018-06-26 DIAGNOSIS — Z515 Encounter for palliative care: Secondary | ICD-10-CM

## 2018-06-27 NOTE — Progress Notes (Signed)
PALLIATIVE CARE CONSULT VISIT   PATIENT NAME: [name] DOB: 02/07/1946 MRN: [id number]  PRIMARY CARE PROVIDER:   [name], MD  REFERRING PROVIDER:  [name], MD [place], [place] [address]  RESPONSIBLE PARTY:   Self  ASSESSMENT:     I met with patient and his wife at home. We spent quite some time discussing his recent hospitalization and his concerns with one of the medical providers. Patient seemed to verbalize a feeling of loss of control. I get the sense that family are aware that he has a poor prognosis. However, patient repeatedly told me that he is "not dead yet" and that he intends to fight. Patient says that hospice was discussed in the hospital but he views hospice involvement as a final step and one in which he is not yet ready to embrace. He initially seemed somewhat reluctant to accept palliative care services and wanted me to clarify that I was not hospice.   Patient says he feels like is has essentially returned to his previous baseline prior to his hospitalization. He denies any distressing symptoms today. He lives at home with his wife and mother-in-law. He says he is independent with all of his ADLs. He has two daughters, both of whom are supportive and involved in his care. He enjoys spending time outside and seemed to feel that his current quality of life is acceptable.   Patient is unclear the medical plan going forward. He wants to have follow up with oncology to discuss options.   We talked about completion of a MOST form and patient seemed interested in this. However, he wanted some time to review it with his family.   RECOMMENDATIONS and PLAN:  1. Continue supportive care 2. Would recommend completion of a MOST form  I spent 60 minutes providing this consultation,  from 1100 to 1200. More than 50% of the time in this consultation was spent coordinating communication.   HISTORY OF PRESENT ILLNESS:  KEDRIC BUMGARNER is a 72 y.o.  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 He was apparently evaluated by surgical oncology but felt to be a poor surgical candidate. He received transfusions and this seemed to improve him clinically. He was discharged home with plan for outpatient blood transfusions as needed. Palliative Care was asked to help address goals of care.   CODE STATUS: FC  PPS: 40% HOSPICE ELIGIBILITY/DIAGNOSIS: TBD  PAST MEDICAL HISTORY:  Past Medical History:  Diagnosis Date  . Anxiety   . Bladder cancer ([place]) 2011  . Borderline diabetes mellitus   . CAD (coronary artery disease)   . Cigarette smoker   . COPD (chronic obstructive pulmonary disease) ([place])   . DJD (degenerative joint disease)   . Emphysema of lung ([place])   . Epistaxis   . History of colonic polyps 2004   hyperplastic   . History of sinusitis   . Hypercholesteremia   . Hypertension   . Obesity     SOCIAL HX:  Social History   Tobacco Use  . Smoking status: Former Smoker    Packs/day: 2.00    Years: 45.00    Pack years: 90.00    Types: Cigarettes    Last attempt  to quit: 10/17/2015    Years since quitting: 2.6  . Smokeless tobacco: Never Used  Substance Use Topics  . Alcohol use: Yes    Alcohol/week: 0.0 standard drinks    Comment: glass of wine at night    ALLERGIES:  Allergies  Allergen Reactions  . Amlodipine Swelling     PERTINENT MEDICATIONS:  Outpatient Encounter Medications as of 06/26/2018  Medication Sig  . acetaminophen (TYLENOL) 325 MG tablet Take 2 tablets (650 mg total) by mouth every 6 (six) hours as needed for mild pain (or Fever >/= 101).  . Albuterol Sulfate (PROAIR RESPICLICK) 833 (90 Base) MCG/ACT AEPB Inhale 2 puffs into  the lungs every 6 (six) hours as needed (shortness of breath).  . ALPRAZolam (XANAX) 0.5 MG tablet Take 0.5 mg by mouth at bedtime.   [name] amoxicillin-clavulanate (AUGMENTIN) 875-125 MG tablet Take 1 tablet by mouth 2 (two) times daily.  . budesonide (PULMICORT) 0.25 MG/2ML nebulizer solution Take 2 mLs (0.25 mg total) by nebulization 2 (two) times daily.  . cholecalciferol (VITAMIN D) 400 units TABS tablet Take 400 Units by mouth at bedtime.  . furosemide (LASIX) 40 MG tablet Take 40 mg by mouth daily.   [name] guaiFENesin (MUCINEX) 600 MG 12 hr tablet Take 2 tablets (1,200 mg total) by mouth 2 (two) times daily.  [name] ipratropium-albuterol (DUONEB) 0.5-2.5 (3) MG/3ML SOLN Take 3 mLs by nebulization 4 (four) times daily.  . methocarbamol (ROBAXIN) 500 MG tablet TAKE 1 TO 2 TABLETS BY MOUTH TWICE A DAY AS NEEDED FOR MUSCLE CRAMPS  . multivitamin-lutein (OCUVITE-LUTEIN) CAPS capsule Take 1 capsule by mouth every evening.   . nitroGLYCERIN (NITROSTAT) 0.4 MG SL tablet Place 1 tablet (0.4 mg total) under the tongue every 5 (five) minutes as needed for chest pain.  [name] ondansetron (ZOFRAN) 4 MG tablet Take 1 tablet (4 mg total) by mouth every 6 (six) hours as needed for nausea.  Vladimir Faster Glycol-Propyl Glycol (SYSTANE OP) Apply 1 drop to eye daily as needed (dry eyes).  . Potassium 99 MG TABS Take 1 tablet by mouth 2 (two) times daily.  . simvastatin (ZOCOR) 20 MG tablet Take 1 tablet (20 mg total) by mouth daily at 6 PM.  . sodium chloride (OCEAN) 0.65 % SOLN nasal spray Place 1 spray into both nostrils as needed for congestion.  . vitamin B-12 (CYANOCOBALAMIN) 1000 MCG tablet Take 1,000 mcg by mouth daily.   No facility-administered encounter medications on file as of 06/26/2018.     PHYSICAL EXAM:   General: NAD, frail appearing Cardiovascular: regular rate and rhythm Pulmonary: poor air movement without wheeze Abdomen: nttp, bowel sounds + Extremities: trace edema Skin: no rashes Neurological:  Weakness but otherwise nonfocal  Irean Hong, NP

## 2018-07-12 ENCOUNTER — Telehealth: Payer: Self-pay | Admitting: Licensed Clinical Social Worker

## 2018-07-12 NOTE — Telephone Encounter (Signed)
Palliative Care SW left a vm for patient in order to set up a home visit.

## 2018-07-13 ENCOUNTER — Ambulatory Visit (INDEPENDENT_AMBULATORY_CARE_PROVIDER_SITE_OTHER): Payer: Medicare Other | Admitting: Pulmonary Disease

## 2018-07-13 ENCOUNTER — Encounter: Payer: Self-pay | Admitting: Pulmonary Disease

## 2018-07-13 ENCOUNTER — Ambulatory Visit (INDEPENDENT_AMBULATORY_CARE_PROVIDER_SITE_OTHER): Payer: Medicare Other

## 2018-07-13 VITALS — BP 132/68 | HR 90 | Temp 98.1°F | Ht 66.0 in | Wt 224.4 lb

## 2018-07-13 DIAGNOSIS — I10 Essential (primary) hypertension: Secondary | ICD-10-CM | POA: Diagnosis not present

## 2018-07-13 DIAGNOSIS — J9621 Acute and chronic respiratory failure with hypoxia: Secondary | ICD-10-CM | POA: Diagnosis not present

## 2018-07-13 DIAGNOSIS — G4733 Obstructive sleep apnea (adult) (pediatric): Secondary | ICD-10-CM

## 2018-07-13 DIAGNOSIS — I272 Pulmonary hypertension, unspecified: Secondary | ICD-10-CM | POA: Diagnosis not present

## 2018-07-13 DIAGNOSIS — I420 Dilated cardiomyopathy: Secondary | ICD-10-CM

## 2018-07-13 DIAGNOSIS — M159 Polyosteoarthritis, unspecified: Secondary | ICD-10-CM

## 2018-07-13 DIAGNOSIS — J441 Chronic obstructive pulmonary disease with (acute) exacerbation: Secondary | ICD-10-CM | POA: Diagnosis not present

## 2018-07-13 DIAGNOSIS — M150 Primary generalized (osteo)arthritis: Secondary | ICD-10-CM

## 2018-07-13 DIAGNOSIS — J9622 Acute and chronic respiratory failure with hypercapnia: Secondary | ICD-10-CM | POA: Diagnosis not present

## 2018-07-13 DIAGNOSIS — C499 Malignant neoplasm of connective and soft tissue, unspecified: Secondary | ICD-10-CM | POA: Diagnosis not present

## 2018-07-13 DIAGNOSIS — C787 Secondary malignant neoplasm of liver and intrahepatic bile duct: Secondary | ICD-10-CM | POA: Insufficient documentation

## 2018-07-13 DIAGNOSIS — J449 Chronic obstructive pulmonary disease, unspecified: Secondary | ICD-10-CM

## 2018-07-13 DIAGNOSIS — I2510 Atherosclerotic heart disease of native coronary artery without angina pectoris: Secondary | ICD-10-CM | POA: Diagnosis not present

## 2018-07-13 DIAGNOSIS — Z87891 Personal history of nicotine dependence: Secondary | ICD-10-CM | POA: Diagnosis not present

## 2018-07-13 MED ORDER — LEVOFLOXACIN 500 MG PO TABS: 500.0000 mg | ORAL_TABLET | Freq: Every day | ORAL | 0 refills | Status: DC

## 2018-07-13 MED ORDER — METHYLPREDNISOLONE ACETATE 80 MG/ML IJ SUSP
80.0000 mg | Freq: Once | INTRAMUSCULAR | Status: AC
  Administered 2018-07-13: 80 mg via INTRAMUSCULAR

## 2018-07-13 MED ORDER — PREDNISONE 20 MG PO TABS: ORAL_TABLET | ORAL | 0 refills | Status: DC

## 2018-07-13 NOTE — Patient Instructions (Signed)
Today we updated your med list in our EPIC system...    Continue your current medications the same...  Continue the NEBULIZER w/ Duoneb 4 times daily & the budesonide twice daily...   Continue the [facility] regularly for the thick phlegm & congestion...  Today we gave you a Depo shot for the bronchial tube inflammation... Starting tomorrow take the Prednisone 20mg  tabs as follows>>  Take 1 tab twice daily for 4 days...  Then decrease to 1 tab each AM for 4 days...  Then decrease to 1/2 tab each AM for 4 days...  Then decrease to 1/2 tab every other day til gone...  We also wrote for an antibiotic- LEVAQUIN 500mg  tabs- take 1 daily x 10d til gone...    Work to expectorate the phlegm...  Call for any questions...  Let's plan a follow up visit in 96mo, sooner if needed for problems.Marland KitchenMarland Kitchen

## 2018-07-13 NOTE — Progress Notes (Addendum)
Subjective:    Patient ID: [name], male    DOB: [birth year], 72 y.o.   MRN: [id number]  HPI 72 y/o WM here for a follow up visit and on-going management of mult med problems...  ~  SEE PREV EPIC NOTES FOR OLDER DATA >>    CXR 7/14 showed COPD/ emphysema, incr markings and scarring at the bases, NAD...  LABS 7/14:  FLP- ok on Simva20;  Chems- ok x BS=120 A1c=6.7;  CBC- wnl;  TSH=1.26;  PSA=0.41...  [name] has established Primary [name] follow up w/ [name] at [facility]>>   CXR 7/15 showed underlying COPD, mild bibasilar fibrosis, norm heart size, DJD in spine, NAD...   PFT 7/15 showed> FVC=3.18 (78%), FEV1=1.22 (38%), %1sec=38; mid-flows=13% predicted... This is c/w GOLD Stage3 COPD and we discussed this...  ~  December 05, 2014:  54moROV & pulmonary recheck; [name] continued smoking, perhaps decr slightly to 1/2ppd but unable to quit & he has gained 6# in the interim up to 221# today;  His CC is sinusitis w/ nasal congestion, drainage & green mucus plus maxillary/ frontal HAs; he denies f/c/s, notes stable cough & chest symptoms w/ DOE;  He has been taking his Advair250Bid but prev refused addition of an anticholinergic due to "the donut hole";  States his breathing is "fine, except in cold air" ( we discussed wearing a scarf in winter etc)... He has GOLD Stage3 COPD w/ FEV1=1.22 (38%predicted) done 7/15;;  Exam shows decr BS bilat w/ few scat rhonchi & mild exp wheezing esp w/ forced maneuver...     We reviewed prob list, meds, xrays and labs> see below for updates >> he is followed by [name] and sees him once per year... PLAN>> we discussed treating his acute max sinusitis w/ Augmentin875Bid, plus align daily & nasal saline as needed; he knows the importance of smoking cessation but is not motivated & declines smoking cessation help; he will continue the Advair250Bid 7 we are adding spiriva via handihaler once per day; we plan ROV in 647mo/ f/u CXR &  PFT, call in the interim for any breathing issues....   ~  June 05, 2015:  20m74moV & [name] a good interval, no new complaints or concerns; still smoking +/-1ppd and blames family stress; notes cough, sm amy yellow sput, no hemoptysis, SOB w/o change/stable- still mows 3 yards, does chores, tends to 20+chickens & their eggs, etc; he has repeatedly declined smoking cessation help, clearly not motivated to quit, not even doing his inhalers regularly!!!    COPD, +smoker> on Advair250Bid & Spiriva daily; symptoms as above, he is stoic, still smoking 1ppd & NOT motivated to quit, asked to cut back & use both inhalers regularly...    HBP> on Metop25Bid, Amlod10, Lisin40; BP=114/64 & he denies CP, palpit, SOB, edema...    CAD> on ASA81; followed by [name] & seen 4/15 (overdue for f/u)> CAD, IWMI 1995, mult PCIs, last Myoview 2013 was low risk; too sedentary, no changes made; 73 9o brother died w/ AAA rupture...    Chol> on Simva20; FLP 7/14 showed TChol 158, TG 145, HDL 33, LDL 96; needs better diet & wt reduction and ret for Fasting blood work overdue.    DM> on diet alone; LABS 7/14 showed BS= 120, A1c= 6.7; we reviewed low carb diet, exercise program, and wt reduction...    Obese> wt recorded as 204# today but it doesn't look like he's lost 26#; we  reviewed diet & exercise...    GI- Gastritis, polyps> prev on Zantac; prev colon 2004 w/ hyperplastic polyp removed & f/u 8/14 by [name] w/ 3 polyps removed (5-833m size, all were tubular adenomas), +divertics in sigmoid, f/u planned 5 yrs... NOTE> exam showed a small palp knot in mid-abd above the umbilicus ?etiology (I offered CT Abd but he declines & wants to discuss w/ his primary- [name]).    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    DJD> on OTC  analgesics as needed...    Anxiety> on Alpraz0.33m66mrn... We reviewed prob list, meds, xrays and labs> see below for updates >>  NOTE> HE IS FOLLOWED BY [name] for GEN MED/ PCP now...  CXR 06/05/15 showed stable heart size, Ao atherosclerosis & tortuousiy, COPD/emphysema, incr markings at the bases, arthritis in Tspine...  LABS 7/16: pt asked to ret for fasting blood work... IMP/PLAN>>  [name] he is stable but continues to smoke 1ppd; CXR w/ COPD/emphysema, atherosclerotic Ao, chronic changes but NAD- we discussed the low-dose screening CT Chest for the early detection of lung cancer (he is a candidate & he will consider participating in this program); in the meanwhile- continue Advair/ Spiriva regularly;  He is overdue for Cards f/u w/ [name], and needs to continue Q6mo51mo w/ [name] (w/ copy of notes to us);[name] will ret for FASTING blood work.  ADDENDUM>> given his age and smoking history, Andrew Postlewaita candidate for LUNG CANCER SCREENING w/ LOW-DOSE CT Chest>>  This office visit constitutes a face-to-face visit for the purpose of lung cancer screening counseling and a shared decision making visit...  Eligibility for LDCT screening:  1) Age=70 (must be 55-77y/o);  2) Absence of symptoms of lung cancer: he denies CP, ch in SOB, hemoptysis.  Smoking History:  1) He has 60+ pk-yrs (must have >30 pk-yr hx smoking);  2) Smoking status- still smoking ~1ppd (current or former smoker who quit<15 yrs ago).  Counseling:  Importance of smoking cessation &/or continued abstinence discussed ; offered smoking cessation interventions- pt declined; pt agreed to continued LDCT screening annually.  Shared decision making:  We reviewed the potential benefits and harms of screening> eg. Early detection of lung cancer and improved survival, the need for additional diagnostic tests & possible surgery, the risk of over-diagnosis/ false positives/ and radiation exposure... All questions were answered  to the best of my ability & he would like to consider the LDCT screening & he will let us kKoreaw his decision=> he ignored this discussion & never called.  ~  October 31, 2015:  5 month ROV & post hospital follow up visit>  [name] was Adm 12/5 - 10/29/15 by [facility] w/ acute hypoxemic resp failure precipitated by a COPD exac; for his part he noted incr SOB, cough, green sput, & "slowing down", he denied f/c/s, no CP, etc;  In the ER he was hypoxemic w/ O2sats in the 70s on RA and eval revealed> CXR- COPD, bullous dis, incr markings at bases, NAD; Cultures of blood/ sput/ urine were all neg; Serologies for strep & legionella were neg; Viral panel was neg;  LABs were OK (WBC=7.9=>14, Hg=15.3=>13.9, BS=100-140, BNP=498);  EKG showed NSR, rate78, NSSTTWA;  2DEcho 10/21/15 showed mildLVH, norm LVF w/ EF=55-60%, no regional wall  motion abn, LA-mod dil, RA- mod to severe dil, RV- mod dil, mod TR, PAsys-164mHg... He was treated w/ Oxygen, IV Solumedrol, Rocephin/ Zithromax, NEBS, Lovenox, flutter, etc; he was disch on Home O2- 2L/min activ & 1L/min rest, Pred taper, Levaquin, Advair250/ Spiriva...  He returns today feeling better, notes his home pulse-ox checks are improving & he has less SOB/DOE, less cough/sput, etc;  He says he has not smoked since PTA...      EXAM shows Afeb, VSS, O2sat=94% on 2L/min [place];  Wt=210# (he was 204 prev);  HEENT- neg, mallampati1;  Chest- decr BS bilat, few scat rhonchi, no w/r/consolidation;  Heart- RR w/o m/r/g;  Abd- obese, soft;  Ext- neg w/o c/c/e...  IMP/PLAN>>  COPD/emphysema, GOLD Stage 3, chronic hypoxemic resp failure- on Home O2 now, exsmoker- quit 10/2015, pulmonary HTN (PAsys by EClarene Duke- WHO group 3>  I congratulated him on not smoking & we discussed continuing the [facility] regularly ([facility], Prednisone taper, Advair, Spiriva, NEBS w/ Albut, Mucinex/flutter;  He will follow up in 3 wks when the Pred has been tapered & we briefly discussed the need for Full PFTs and CT  Chest later...   ~  November 21, 2015:  3wk ROV & MrWicker reports that he feels much better, SOB improved, cough improved, still prod of a sm amt green sput w/o blood w/o f/c/s;  He's been using the NEB w/ Albut TID followed by his AdvairBid & SpirivaQd; he is off the Pred now, he is too sedentary & not exercising => he needs PULM REHAB & we will refer, [facility] we will inquire as to what he might be elig for...       EXAM shows Afeb, VSS, O2sat=98% on 2L/min [place];  Wt=216# (he was 204-210# prev);  HEENT- neg, mallampati2;  Chest- decr BS bilat, otherw clear w/o w/r/r;  Heart- RR w/o m/r/g;  Abd- obese, soft;  Ext- neg w/o c/c/e...  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 We plan ROV in 6 wks.  ~  February 03, 2016:  2-324moOV & ErSharman Crateeturns w/ his severe obstructive lung dis, GOLD Stage 3, chr hypoxemic resp failure w/ pulmHTN (WHO group3); he quit smoking 10/2015 & is using home O2 3L/min, NEBS w/ Albut tid, Advair250Bid, Spiriva daily, Mucinex etc... He reports that breathing is stable overall but he is c/o 64m37mo URI w/ cough, thick yellow sput hard to produce, incr SOB 7 chest tightness but no fever, no CP=> he wants another antibiotic... He spent much time ventilating about his problems w/ DME-[facility], his O2 set-up, & [facility] program- they still haven't contacted him about starting Rehab... He does have mult entries into EPIC from [facility] reviewed...     EXAM shows Afeb, VSS, O2sat=92% on 3L/min [place];  Wt=231# (up 15# & he was 204-210# prev);  HEENT- neg, mallampati2;  Chest- decr BS bilat, otherw clear w/o w/r/r;  Heart- RR w/o m/r/g;  Abd- obese, soft;  Ext- neg w/o c/c/e.  PFT 02/03/16>  FVC=2.57 (68%), FEV1=1.05 (38%), %1sec=41, mid-flows reduced at 18% predicted;  post bronchodil FEV1 w/o change;  TLC=6.67 (107%), RV=3.97 (176%), RV/TLC=60;  DLCO=22% and DL/VA=29%... This is c/w a severe obstructive ventilatory defect w/ air trapping and severely decr DLCO c/w  emphysema- GOLD Stage 3 COPD... IMP/PLAN>>  We discussed continuing the NEBS Tid followed by Advair250Bid & Spiriva daily; add Mucinex 1283mBid & we will Rx w/ Levaquin500/d x7d for his bronchitic infection (see AVS);  He still needs a CT Chest but wants to wait for now;  [facility], and he wants diff DME supplier- we will inquire, plan ROV 356mo. ADDENDUM 03/18/16>> Pt unable to afford inhalers- ADVAIR & SPIRIVA discontinued and placed on NEBULIZER w/ DUONEB QID & PULMICORT 0.25 BID regularly (we will update med list to reflect this change).  ~  May 05, 2016:  3m741moV & [name] notes DOE w/ exertion- he is in PulHealth Netusing up to 6L/min w/ exercise (he tried Oxy[employer] 1 Automotiveusing 3-4L/min at rest & Qhs; he feels that he is making some progress & I reminded him the DIET + EXERCISE and wt loss would be very beneficial to his breathing; states he is walking & mowing on his off days (ie- when not in rehab); despite everything his wt continues to climb- now up 4# to 235# "exercise makes me hungry"... we reviewed the following medical problems during today's office visit >> his new PCP is [name]...    COPD, ex-smoker, chr hypoxemic RF> on NEB w/ Duoneb Qid + Budes-Bid + HFA prn (Advair & Spiriva too $$); he finally quit smoking, symptoms as above, he is now in pulm rehab but having issues w/ low sats...    HBP> on MetopER-9m40mM&1PM), Amlod10, Lisin10, Lasix40Bid; BP=136/66 & he denies CP, palpit, SOB, edema...    CAD> on ASA81, Imdur30; followed by [name] & seen 04/2016> CAD, IWMI 1995, mult PCIs, last Myoview 2013 was low risk; too sedentary, edema decr w/ Lasix; 73 y85 brother died w/ AAA rupture...    Chol> on Simva20; FLP 12/16 showed TChol 138, TG 75, HDL 25, LDL 98; needs  better diet & wt reduction and better compliance.    DM> on diet alone; LABS 5/17 showed BS= 118, A1c= per PCP; we reviewed low carb diet, exercise program, and wt reduction...    Obese> wt = 235#, 5'6"Tall, BMI=38;  we reviewed diet & exercise...    GI- Gastritis, polyps> prev on Zantac; prev colon 2004 w/ hyperplastic polyp removed & f/u 8/14 by [name] w/ 3 polyps removed (5-7mm 61me, all were tubular adenomas), +divertics in sigmoid, f/u planned 5 yrs... NOTE> exam showed a small palp knot in mid-abd above the umbilicus ?etiology (I offered CT Abd but he declines & wants to discuss w/ his primary- [name]).    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     DJD> on OTC analgesics as needed...    Anxiety> on Alpraz0.5mg pr34m. EXAM shows Afeb, VSS, O2sat=92% on 3L/min [place];  Wt=231# (up 15# & he was 204-210# prev);  HEENT- neg, mallampati3;  Chest- decr BS bilat, otherw clear w/o w/r/r;  Heart- RR w/o m/r/g;  Abd- obese, soft;  Ext- neg w/o c/c/e. IMP/PLAN>>  [name] is rec to continue current meds regularly, get on diet & get wt down , continue in PulmRehab, etc; we wrote for a ProairHFA rescue inhaler for prn use...   ~  August 05, 2016:  41mo ROV14moast visit we rec continuing same meds but stressed DIET, EXERCISE, wt  reduction-- unfortunately he has GAINED 8# up to 243# (BMI=39) in the interval;  He states that "I don't get hungry, I only eat one sandwich & sometimes a salad at night;  He says "they kicked me out" of the [facility] program due to low oxygens even w/ use of the Oxymizer;  He says he is exercising at home now;  He has established PCP w/ [name] & apparently he did labs which pt reports were "OK".    [name] c/o cough, chest congestion, yellow sput, ans a "sinus infection" w/ yellow drainage; notes incr  SOB but denies CP/ f/c/s, edema;   EXAM shows Afeb, VSS, O2sat=92% on 3L/min [place];  Wt=231# (up 15# & he was 204-210# prev);  HEENT- neg, mallampati3;  Chest- decr BS bilat, otherw clear w/o w/r/r;  Heart- RR w/o m/r/g;  Abd- obese, soft;  Ext- neg w/o c/c/e. IMP/PLAN>>  [name] describes an upper resp infection & COPD exac=> Rx w/ Levaquin, Pred23m-4d taper, + his NEBS w/ duoneb Qid & Budes Bid; he desperately needs to get the weight down...  ~  October 13, 2016:  EDeshayreturns and says "I've been worse" & indicates that he really likes the BiPAP he was placed on & disch with after his last Hosp 11/10 - 09/29/16; records reviewed by me- Adm w/ cough, yellow sput, chills and incr SOB; CXR revealed COPD, incr markings & bibasilar atx; Sput/ blood cultures and resp viral panel were all neg; LABS showed WBC~13K, anemia w/ Hg~11, Chems- ok x elev BS (his PCP is [name]); BNP was 29; ABGs showed pH=7.38, pCO2=58, pO2=79 on [place] O2; note that Bicarb level ~30;  2DEcho showed EF 60-65%, Gr1DD, PAsys~750mg;  He was felt to has a COPD exac, Cor Pulmonale- treated w/ BiPAP (he was INTOL to CPAP), given IV Solumedrol (DC on Pred taper), IV Zithromax, NEB treatments and improved...    He now returns & notes that he likes the BiPAP, feels like it is really helping (resting better, sleeps thru the night, wakes refreshed, no daytime sleepiness, & naps 2/7 days now) & wants to continue the BiPAP; NOTE that we had rec a sleep study to him on mult occas but he has always refused!  He now agrees to a sleep study, hoping that it will lead to medicare approval for his home BiPAP => HE QUALIFIES FOR BiPAP BASED ON COPD DIAGNOSIS w/ ABG SHOWING pCO2>52 ON HIS NASAL CANNULA O2, he needs the SLEEP OXIMETRY STUDY confirming O2sat<88% for cumulative >110m50mwhile on his prescribed O2, and OSA/CPAP treatment have been considered & ruled out... He has a home O2conc & a POC which he uses 3-4L pulse dose w/ his home sats in the 90s at rest &  down to 88% w/ exerc...     COPD, mixed type w/ emphysema>  He has GOLD Stage3 COPD & on O2 as noted, NEBS w/ DUONEB Qid & BUDESONIDE0.25Bid, plus ProairHFA prn when out & about; off the Pred Rx [facility]...    Ex-smoker>  He quit late in 2016...    Chronic resp failure w/ hypoxemia and hypercarbia>  On above meds + ASA, Metoprolol, Imdur, Lasix    Cor pulmonale, secondary pulm hypertension>  He is on O2 24/7 using 4L/min continuous at night & 4L/min pulse dose during the day...    CARDIAC issues>  HBP, CAD w/ IWMI 1995 & mult PCIs, HL (on Simva20)- all followed by [name]...    MEDICAL  issues>  HBP, HL, DM, obesity, gastritis, divertics, colon polyps, BPH, hx bladder cancer, DJD, anxiety EXAM shows Afeb, VSS, O2sat=97% on 4L/min [place];  Wt=240#;  HEENT- neg, mallampati3;  Chest- decr BS bilat, otherw clear w/o w/r/r;  Heart- RR w/o m/r/g;  Abd- obese, soft;  Ext- neg w/o c/c/e.  CXR 09/26/16>  Norm heart size, incr bibasilar atx... IMP/PLAN>>  [name] needs to continue his O2, NEBS w/ Duoneb & Budes regularly; he wants to continue the BiPAP & we need to see if he meets Medicare's criteria for BiPAP device- proceed w/ Sleep Study...   ADDENDUM>>  Sleep Study 10/15/16>  AHI=1.8/hr (8 hypopneas & 4 RERAs;  AHI during REM = 17.6/hr;  Study done on 4LO2 & min O2sat= 88%;  Mod snoring noted;  No arrhythmias;  PLMS index= 37, but assoc arousals was only 0.2;  He appears to meet the criteria for BiPAP & we will submit to [employer]/ [employer]... Plan ROV recheck in 6-8 weeks.  ~  November 24, 2016:  6wks ROV and pulmonary follow up visit>  [name] is stable w/ his severe disease- on BiPAP Qhs, Home O2- 24/7, NEB w/ Duoneb Qid & Budes Bid, he has rescue inhaler and [place] nasal mist;  His weight is all the way up to 257# (up 18# in 6wks) and c/o cough off & on, small amt yellow sput, no f/c/s, SOB "the same" but notes incr DOE w/ walking; unfortunately he has been very sedentary- not  exercising at all (prev in pulm rehab & they discharged him from the program)...     SEE PROBLEM LIST-- above... EXAM shows Afeb, VSS, O2sat=97% on 4L/min [place];  Wt=240#;  HEENT- neg, mallampati3;  Chest- decr BS bilat, otherw clear w/o w/r/r;  Heart- RR w/o m/r/g;  Abd- obese, soft;  Ext- neg w/o c/c/e.  LABS 11/24/16>  Chems- ok w/ BS=117, Cr=0.64, LFTs=wnl;  CBC- anemic w/ Hg=10.56mv=95, wbc-norm;  Fe=191 (sat=56%), Ferritin=109;  TSH=1.28...  Abd SONAR => ordered but never done...  Last CT Chest 08/19/16> mild cardiomeg w/ aortic & coronary atherosclerotic changes & PA enlargement w/ 3.4cm outflow tract; no adenopathy; severe bullous emphysema; bilat pulm nodules w/ 7.276mRUL nodule & 7.9 LLL lesion; Abd showssmall gallstones vs sludge & 6cm right renal cyst, mod thoracic spondylosis...  Last 2DEcho 09/29/16> mod conc LVH, norm LVF w/ EF=60-65%, no regional wall motion abn, Gr1 DD, norm valves, RV dil w/ norm sys function, PAsys=74 mmHg...  Last ONO > cannot find results IMP?PLAN>>  ErD'Arcyas severe COPD/emphysema w/ pulmHTN & CorPulmonale; he refuses to fill inhalers due to costs & he is therefore on O2- 24/7, BiPAP nightly, NEBS w/ Duoneb Qid & Budes Bid, Mucinex 1200Bid, & asked to get into a regular exercise program;  He will continue regular f/u w/ CARDS- [name]; must work on weight reduction...  ~  January 27, 2017:  18m28moV & [name] was ADM by [facility] 3/7 - 01/26/17 w/ Acute on Chronic diastolic CHF, dilated cardiomyopathy, COPD exac, acute on chr resp failure;  He was diureses 30# of fluid 257# to 227# today but he is quite vehemently opposed to this being diastolicCHF and says it was pneumonia & wouldn't have happened if he had an antibiotic when he needed it (his words); Pulm consult from DrYacoub also remarked about pt being very upset at suggestion of CHF & demanded to see pulmonary...             ...Marland KitchenMarland Kitchen CXR 01/22/17 showed cardiomegaly, aortic  atherosclerosis, bilat emphysema & interstitial  prominence in the bases, no consolidation or effusion..  EKGs 01/2017 showed NSR, poor R prog V1-2, one PVC pair...  2DEcho 01/20/17 was a poor quality study  But showed mild conc LVH (prev mod), norm LVF w/ EF=55-60% & no RWMA, Ao root mildly dil, AoV mild calcif leaflets, MV norm, LAdil 64m, RV dil, norm RVF, PAsys- not meas (([id number]  LABS showed BS~130-180 range, Cr=0.8-1.1 range, Hg~10-11 range, WBC~10=>6K, Troponins- neg, BNP=35...    He was treated w/ [phone] placed on BiPAP, Oxygen, NEBS, Solumedrol, & empiric antibiotics; He was seen by Pulmonary & transitioned to oral Levaquin, Prednisone, & continued on his home regimen of Oxygen- 24/7, BiPAP Qhs, NEBS w/ DUONEB Qid & Budesonide Bid, Mucinex 1208mBid; Lasix trimmed to 4033md at disch yest... Since disch he is c/o "weak", notes some cough, sput, & SOB; we discussed keeping an Ab on hand so he can take it when he wants at the earliest sign of a resp infection- Levaquin 500m39mily #10 w/ refill written at his request, reminded to always take a probiotic like ALIGN & activia yogurt while on the broad spectrum antibiotics...     EXAM shows Afeb, VSS, O2sat=97% on 4L/min [place];  Wt=227#;  HEENT- neg, mallampati3;  Chest- decr BS bilat, otherw clear w/o w/r/r;  Heart- RR w/o m/r/g;  Abd- obese, soft;  Ext- neg w/o c/c/e. IMP/PLAN>>  ErneShepard severe end-stage COPD- mixed chr bronchitis & emphysema w/ chr hypoxemia & hypercarbic resp failure, w/ Hx pulmHTN & right heart failure;  Now improved and approaching his baseline on max meds=> O2 at 2-4L/min 24/7 (he has [employer] that supplies 2L/min continuous or up to 6L/min pulse dose flow), nocturnal BiPAP, NEBs w/ Duoneb Qid & Budes 0.25Bid, Mucinex 1200mg49m weaning oral Prednisone from his recent hosp, finished oral Levaquin; on ASA81, Lasix40Bid, Avapro75, Imdur30, K20, Zocor20, and off prev Metoprolol... We discussed continuing the Pred taper til gone, continue other meds the same for now & ROV  recheck 21mo..60mote: he has been followed for Cards by [name] (HBP, CAD w/ IWMI 1995 & mult PCIs, HL) and last seen in 201`5 but he saw Alinda Egolf Richardson Dopp 5-04/2016...  ADDENDUM>> CT Chest 02/17/17 (72mo fo22mo up CT Chest- compare to 08/19/16)>> mild cadiomeg w/ atherosclerotic calcif in Ao/ great vessels/ coronaries; no enlarged adenopathy; extensive bullous emphysema, areas of mild interstitial prominence, stable nodular opacities (largest 1.3 x 0.9 cm in inferior LLL abutting the pleura) & no new parenchymal lung opacity; upper abd shows atherosclerotic calcif of Ao, right renal cyst, & an incompletely vis mass in mid abd=> needs CT Abd... ADDENDUM>> CT Abd & Pelvis 02/18/17 showed a large 25 x 16 x 18 cm mass in right abd mesentary, no adenopathy, additional findings include ao & coronary calcif, mod centrilob emphysema, diffuse hepatic steatosis, cholelithiasis & mod left colonic diverticulosis...   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);  [facility]/ eval>>     [facility] DrLevineTull Dx w/ soft tissue sarcoma right abd, felt to be high risk for surg due to COPD & cardiac issues=> rec referral to Pulm, [facility]  [facility]    XRT by DrBlackstock- [facility] 03/18/17>  They planned 45 Gy in 25 fractions- completed by 05/06/17, taking imodium for loose stools    Pulmonary consult from DrPascual 04/28/17>  He felt that abd surg would pose a high risk of pulm complications given his age, chronic hypercarbic & hypoxemic resp failure, poor exercise capacity, & pulm HTN; in addition they felt he was on a good triple therapy regimen & did not pose any changes...    Pt notes that his abd has shrunk & his breathing  is sl better; still using 4-6L/min O2 & checks his pulse ox readings; he notes the O2 also helps his leg cramps "the nurse says it's oxygen deprivation" (using [facility]); he remains on BiPAP Qhs & rests well, wakes refreshed, denies daytime sleepiness;  He has been regular w/ his NEB rx using Duoneb Qid & Budesonide Bid + AlbutHFA rescue inhaler as needed...     EXAM shows Afeb, VSS, O2sat=99% on 4L/min [place];  Wt=227#;  HEENT- neg, mallampati3;  Chest- decr BS bilat, otherw clear w/o w/r/r;  Heart- RR w/o m/r/g;  Abd- protuberant abd, soft & nontender, norm BS;  Ext- VI, +1edema.  Last CXR was 01/22/17 showing cardiomegaly, aortic atherosclerosis, bilat emphysema & interstitial prominence in the bases, no consolidation or effusion..  Last CT as above - [facility] abd CT in August (after the XRT treatment)...  PFTs in [name] Everywhere>  [facility] 04/28/17 showed FVC=2.60 (70%), FEV1=0.97 (36%), %1sec=37%...  Labs in [name] everywhere 04/2017>  Chems- ok x BS=125, Cr=0.64, LFTs=wnl... 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  ~  October 10, 2017:  41moROV & ELexindicates to me that he was told the abd tumor mass had not shrunk enough to consider surgery, and the Oncology team is holding off on ChemoRx until it's necessary for tumor growing, recently it has been about the same... From the pulmonary standpoint he feels that he is breathing OK- notes tired & sl SOB in the afternoon, decr cough & sl beige sput, no hemoptysis; he is NOT very active & getting [facility] for PT;  Using O2 at 4L/min & O2sat=94%... We reviewed the following NOTES is Epic [name]  Everywhere>    [facility] intra-abd site- he has completed 25 fractions of XRT on 05/06/17, notes some improvement in breathing & has decr O2 to 5L/min; notes some decr abd & leg swelling;  CT Abd&Pelvis [facility]/24/17 [facility]>  IMPRESSION: 1)Stable large heterogeneously enhancing mass in the right abdomen compatible with spindle cell neoplasm. 2)853mhyperenhancing lesion in the left hepatic lobe is too small to characterize but favored to represent a flash filling hemangioma or arterioportal shunt. 3)Stable subcentimeter pulmonary nodules as described above. Severe emphysema. Given smoking history, Fleischner guidelines recommend follow-up chest CT in 18 months. 4)Stable right hilar lymph node measuring 1.3 cm short axis, favor reactive. 5)(Hepatic steatosis. 6)Splenomegaly.  DrLevine felt he was too high risk for surg based on his pulm & cardiac issues; REC for watchful waiting & f/u w/ med oncology for poss  chemoRx when approp...    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 2) Progressively enlarging left adrenal nodule, currently measuring 9 mm in diameter. Given the avid enhancement and enlargement, this would be concerning for a potential site of metastasis. 3) Extensive pulmonary emphysema. Small pulmonary nodules are indeterminate, but show no interval change from the oldest available comparison CT examinations. Continued attention on follow-up. 4) Additional unchanged findings as noted above;  They rec f/u CT scans in 4 months & close observation... PT was ordered for the pt...    10/03/17 GENETIC Testing results>  [name] was  contacted by telephone today to discuss his negative genetic test results. No pathogenic mutations were detected on a 43-gene panel for evaluation of hereditary cancer. Genetic testing was pursued based on patient's extensive personal and family history of cancer.   We reviewed the following medical problems during today's office visit>     COPD, mixed type w/ emphysema>  He has GOLD Stage3 COPD & on O2 as noted, NEBS w/ DUONEB Qid & BUDESONIDE0.25Bid, plus ProairHFA prn when out & about; off the Pred Rx given last during his 09/2016 [facility] & tapered off...    Ex-smoker>  He quit late in 2016...    Chronic resp failure w/ hypoxemia and hypercarbia>  On above meds + Lasix40Bid, K20; also on O2 at 4L/min regularly & BiPAP Qhs=> need to derview download data...    Cor pulmonale, secondary pulm hypertension>  He is on O2 24/7 using 4L/min continuous at night & 4L/min pulse dose during the day...    Intra-abdominal SARCOMA>  See above- eval & management from [place], XRT, Surg Oncology...    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    MEDICAL issues>  HBP, HL, DM, obesity, gastritis, divertics, colon polyps, BPH, hx bladder cancer, DJD, anxiety  EXAM shows Afeb, VSS, O2sat=94% on 4L/min [place];  Wt=235#;  HEENT- neg, mallampati3;  Chest- decr BS bilat, otherw clear w/o w/r/r;  Heart- RR w/o m/r/g;  Abd- protuberant abd, soft & nontender, norm BS;  Ext- VI, +1edema.  LABS> pt reports labs from [facility] office recently were all OK... IMP/PLAN>>  I would like to see a BiPAP download, continue current meds the same & f/u scans as planned at [facility]; we will recheck pt in 54mo sooner if needed prn...  ~  January 10, 2018:  332moOV & pulmonary/ medical follow up visit> ErIzraeleturns and indicates that he is doing better overall, feels that his breathing is at baseline but he arrives today in wheelchair w/  his O2 at 4L/min via POC- he continues on his NEB Qid, Budes Bid, nightly BiPAP & his oxygen;  We can't get download from [place] says because Medicare says he didn't qualify & won't pay them?)  He notes appetitie is good, eating well he says;  He continues getting home PT from [facility]... We reviewed the following interval Epic/ [name] Everywhere records>      [facility]>  F/u primary intra-abd spindle-cell sarcoma, s/p  XRT w/ 45Gy in 25 fragments betw 5/17 - 05/06/17;  CT Abd&Pelvis [facility]/27/17 at [facility]>  CONCLUSION:  1) 1.5 cm left adrenal nodule, increased in size and concerning for metastasis.  2) 1.6 cm left hepatic lobe hypodensity measures slightly larger, concerning for malignancy. Consider liver MRI to further assess or attention on follow-up. Liver has a similar lobular contour and cirrhosis is not excluded.  3) 25 x 14 x 17 cm right lower quadrant spindle cell carcinoma is similar in size/appearance.  4) Left lung pleural-based or subpleural nodules are similar to slightly decreased. Prominent right hilar node is similar. Continued attention on follow up is advised.  5) 1.7 cm nodule arising from the thyroid isthmus, similar compared to prior. Consider thyroid ultrasound for further evaluation.  Oncology is considering IR- ablation of the liver & adrenal lesions... We reviewed the following medical problems during today's office visit>     COPD, mixed type w/ emphysema>  He has GOLD Stage3 COPD & on O2-4L/min as noted, NEBS w/ DUONEB Qid & BUDESONIDE0.25Bid, plus ProairHFA prn when out & about; off the Pred Rx given last during his 09/2016 [facility] & tapered off...    Ex-smoker>  He quit late in 2016...    Chronic resp failure w/ hypoxemia and hypercarbia>  On above meds + Lasix40Bid, K20; also on O2 at 4L/min regularly & BiPAP Qhs=> need to review download data...    Cor pulmonale, secondary pulm hypertension>  He is on O2 24/7 using 4L/min continuous at night &  4L/min pulse dose during the day...    Intra-abdominal SARCOMA w/ mets>  See above- eval & management from [place], XRT, Surg Oncology, Interventional Radiology...    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    MEDICAL issues>  HBP, HL, DM, obesity, gastritis, divertics, colon polyps, BPH, hx bladder cancer, DJD, anxiety EXAM shows Afeb, VSS, O2sat=94% on 4L/min [place];  Wt=235#;  HEENT- neg, mallampati3;  Chest- decr BS bilat, otherw clear w/o w/r/r;  Heart- RR w/o m/r/g;  Abd- protuberant abd, soft & nontender, norm BS;  Ext- VI, +1edema. IMP/PLAN>>  He is at baseline from the pulm standpoint- continue current Opxygen, BiPAP Qhs, Duoneb Qid & Budes Bid;  On-going management of his intra-abdominal sarcoma by [place] mutidisciplinary team...   ~  Apr 12, 2018:  43moROV & [name] states that his breathing is fair & stable on a regimen of DUONEB Qid, Budes0.25 via neb Bid, AlbutHFA rescue inhaler as needed;  He remains on O2 at 4L/min, BiPAP but we can't get download (Ace Ginssaid he didn't qualify & Medicare won't pay them);  He denies cough, notes sm amt green sput, no hemoptysis, SOB/DOE is about the same he says (eg- with walking but he tilled garden w/ tractor)...  We reviewed the following interval Epic/ [name] Everywhere records>       He [facility]>  Impression:   1.Continued upward trend in the size of the left hepatic lobe hypodensity with rim enhancement which is likely a metastasis.  2.Similar size of the 1.5 cm left adrenal nodule concerning for metastasis. .[name] 3.Similar size and appearance of the large right lower quadrant spindle cell carcinoma. 4.No interval change in the pulmonary nodules as further described above. 5.Stable size of a prominent right hilar lymph node. 6.Similar size of the 1.7 cm hyperdense nodule arising  the thyroid isthmus.  Consider thyroid ultrasound for further evaluation.     CT OF THE ABDOMEN WITH INTRAVENOUS CONTRAST (CIRRHOSIS PROTOCOL) 03/06/18> Redemonstrated ill-defined hypodense lesion with peripheral enhancement involving the left hepatic lobe. As previously discussed, continued enlargement over multiple studies is concerning for metastatic involvement. No additional suspicious focal liver lesions appreciated.      [facility] at [facility]>  CBC- Hg=8.3, mcv=96, WBC=3.9, Fe=58 (19%sat), Ferritin=274;  Chems- K=4.4, BS=120, Cr=0.89, LFTs are wnl DrHolwerta transfussed him 2u packed cells (A+) on 01/18/18 when Hg=7.8.Marland KitchenMarland Kitchen He had FNA of thyroid nodule on 03/01/18 w/ PATH= benign follicular nodule...    He [facility]Raj- [facility] on 02/28/18>  His excellent summary note is reviewed; most recent CT (above) w/ incr size of liver met & new left adrenal met => refer to IR for consideration of Ablation...    He saw Dr[facility]TDowning-[facility] to discuss Liver Ablation on 03/28/18>  Note reviewed, 2.5cm nodule in left hepatic lobe, and 1.4cm left adrenal nodule, hx prev XRT to the abd mass (45 Gy in 25 fractions completed 05/06/17);  They are planning percutaneous microwave ablation of the liver lesion and cryoablation of the left adrenal lesion We reviewed the following medical problems during today's office visit>     COPD, mixed type w/ emphysema>  He has GOLD Stage3 COPD & on O2-4L/min as noted, NEBS w/ DUONEB Qid & BUDESONIDE0.25Bid, plus ProairHFA prn when out & about; off the Pred Rx given last during his 09/2016 [facility] & tapered off...    Ex-smoker>  He quit late in 2016...    Chronic resp failure w/ hypoxemia and hypercarbia>  On above meds + Lasix40Bid, K20; also on O2 at 4L/min regularly & BiPAP Qhs=> need to review download data...    Cor pulmonale, secondary pulm hypertension>  He is on O2 24/7 using 4L/min continuous at night & 4L/min pulse dose during the day...    Intra-abdominal SARCOMA w/ mets>  See above- eval & management from [place], XRT, Surg Oncology, Interventional Radiology...    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    MEDICAL issues>  HBP, HL, DM, obesity, gastritis, divertics, colon polyps, BPH, hx bladder cancer, DJD, anxiety... His PCP is DrHolwerta. EXAM shows Afeb, VSS, O2sat=99% on 4L/min [place];  Wt=227#;  HEENT- neg, mallampati3;  Chest- decr BS bilat, otherw clear w/o w/r/r;  Heart- RR w/o m/r/g;  Abd- protuberant abd, soft & nontender, norm BS;  Ext- VI, +1edema. IMP/PLAN>>  [name] has on-going issues w/ the intra-abd sarcoma (s/p XRT at [facility]) & apparent mets to liver & adrenal (planned ablations by IR at [facility]); he was anemic & required Tx raising the concern for bleeding- [facility] are aware;  Continue same pulmonary Rx...   ~  July 13, 2018:  41moROV & [name] 1st was [facility] - 05/18/18 by [facility] w/ GIB- bloody stools and anemia; then re-admitted 7/24-28/2019 by [facility] w/ severe GIBleed & [facility]/17/[facility] (see below); today he is c/o sinus infection w/ drainage, cough, yellow/green mucus, & incr SOB=> we decided to treat w/ Levaquin & Pred taper...    1st ADM [facility] 6/29 - 05/18/18 by [facility]>  Presented w/ bloody stools & anemia, known hx intra-abd sarcoma, COPD on BiPAP Qhs & home O2, CAD- s/p stents & DiastolicCHF; he was transfused,  felt to be too chr ill for colonoscopy & bleeding appeared to stop spont- no interventions necessary (divertic bleed was suspected); ASA was stopped & NSAIDs avoided, started on Protonix; hemodynamically stable & euvolemic...  CT Abd & pelvis 05/13/18>  desc & sigm diverticulosis w/o diverticulitis; stable mass within the mesentary of right abd ~20 x 18 x 16 cm; coronary atherosclerosis + mod centrilob & paraseptal emphysema at lung bases; +hepatic steatosis + stable 4.7 x 3.5 x 3.6 cm hypodense lesion in left  lobe; +small gallstones near the neck; fatty replacement of pancreas;  Left adrenal nodule, cyst right kidney, atherosclerotic abd aorta...  CTA Abd&Pelvis 05/14/18>  No specific explanation for GIB, no active hemorrhage, extensive distal colon diverticulosis; known spindle cell carcinoma in RLQ, left hepatic & left adrenal nodules, cholelithiasis  LABS 6=>7/19:  CBC- Hg=7.1 => 9.2;  He was transfused;  Chems- ok...    2nd ADM at [facility] 7/24 - 06/11/18 by [facility]>  Recurrent GIB initially dark=> BRB and anemic w/ Hg=6.7 but hemodynamically stable; CT Abd w/o acute changes, CTA w/o bleeding site ident; he was transfused & GI team suggested [facility]    Transferred to [place] 7/28 - 06/17/18>  140 page DC summary in [facility] has been reviewed by me-- the bleeding was felt to be due to the necrotic mesenteric mass but they didn't place coils because it stopped bleeding spontaneously... They did colonoscopy We reviewed the following medical problems during today's office visit>     COPD, mixed type w/ emphysema>  He has GOLD Stage3 COPD & on O2-4L/min as noted, NEBS w/ DUONEB Qid & BUDESONIDE0.25Bid, plus ProairHFA prn when out & about; off the Pred Rx given last during his 09/2016 [facility].    Ex-smoker>  He quit late in 2016...    Chronic resp failure w/ hypoxemia and hypercarbia>  On above meds + Lasix40Bid, K20; also on O2 at 4L/min regularly & BiPAP Qhs=> need to review download data...    Cor pulmonale, secondary pulm hypertension>  He is on O2 24/7 using 4L/min continuous at night & 4L/min pulse dose during the day...    Intra-abdominal SARCOMA w/ mets>  See above- eval & management from [place], XRT, Surg Oncology, Interventional Radiology; he is s/p ablation therapy 6/19 by IR at [facility] for his liver & adrenal met...    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    MEDICAL  issues>  HBP, HL, DM, obesity, gastritis, divertics, colon polyps, BPH, hx bladder cancer, DJD, anxiety... His PCP is DrHolwerta. EXAM shows Afeb, VSS, O2sat=94% on 4L/min [place];  Wt=224#;  HEENT- neg, mallampati3;  Chest- decr BS bilat, otherw clear w/o w/r/r;  Heart- RR w/o m/r/g;  Abd- protuberant abd, soft & nontender, norm BS, lsrge mass is papl;  Ext- VI, +1edema. IMP/PLAN>> [name] has been thru it- known large intra-abd sarcoma, mets to liver, adrenal,?lung and s/p IR ablation therapy to liver & adrenal met; then active GI bleed July=>Aug as above; now c/o sinus infection & COPD exac for which we will treat w/ Levaquin & Pred course... we plan rov recheck in 7mo..          Problem List:  Hx of SINUSITIS (ICD-473.9) - off Nasonex now & recent Rx w/ Augmentin/ Saline... notes occas sinus congestion, drainage, sl HA, etc... he knows that he needs to quit smoking! ~  10/10:  Adm for epistaxis w/ eval by DrWolicki- CXR= NAD, labs OK, rec for saline lavage- nose bleed resolved & disch home (no recurrence so far). ~  1/16: he presented w/ acute max & frontal sinusitis> Rx w/ Augmentin, Align, Saline, Mucinex, etc... ~  3/17: he has acute bronchitic exac- treated w/ Levaquin x7d + his regular meds...  COPD (ICD-496) >>   ~  on ADVAIR 250Bid; STILL SMOKES CIGARETTES & he's refused Chantix...  ~  He had hemoptysis 6/99 without lesion on CXR, CTChest, or Bronchoscopy... ~  Adm 10/10 w/ epistaxsis & eval DrWolicki as noted> he wanted to do Sleep Study but pt cancelled it & declined to resched. 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 ~  CXR 5/12 showed ectatic calcif & tort Aorta, COPD w/ incr markings ar bases, NAD, +degen spondylosis. ~  CXR 6/13 showed COPD/ emphysema, scarring in the lower lobes, otherw clear lungs, DJD in spine... ~  CXR 7/14 showed COPD/ emphysema, incr markings and scarring at the bases, NAD. ~  1/15: [name] says he's decr his smoking from prev 3-4ppd down to 3 cig/d;   He remains on Advair250> states breathing is ok, at baseline CAT score=12; baseline cough, sput, no hemoptysis, stable DOE, no edema... ~  CXR 7/15 showed underlying COPD, mild bibasilar fibrosis, norm heart size, DJD in spine, NAD...  ~  PFT 7/15 showed> FVC=3.18 (78%), FEV1=1.22 (38%), %1sec=38; mid-flows=13% predicted... This is c/w GOLD Stage3 COPD and we discussed this> He is rec to quit all smoking immediately; he has been using the nicotine gum & rec to try patches, offered Wellbutrin, he refuses Chantix; needs to continue ADVAIR250Bid & samples givem; he declines to start [place] at this time due to cost & donut hole (we will address this again on return); reminded to use MUCINEX 6106m- 1to2 Bid w/ fluids, and OK to use OTC DELSYM as needed for cough...   ~  1/16: he claims stable on Advair250 but still smoking & can't vs won't quit; we decided to add Spiriva daily via handihaler... 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 ~  12/16:  [facility], COPD exac, and found to have pulmHTN w/ 2DEcho showing PAsys=51;  Treated w/ O2, Pred taper, antibiotics, NEBS, Advair/Spiriva, etc;  He says he has quit smoking... ~  [facility]- COPD, bullous dis, incr markings at bases, NAD; Cultures of blood/ sput/ urine were all neg; Serologies for strep & legionella were neg; Viral panel was neg; He was treated w/ Oxygen, IV Solumedrol, Rocephin/ Zithromax, NEBS, Lovenox, flutter, etc; He was disch on Home O2- 2L/min activ & 1L/min rest, Pred taper, NEBS, Levaquin, Advair250/ Spiriva => improved. ~  3/17:  severe obstructive lung dis, GOLD Stage 3, chr hypoxemic resp failure w/ pulmHTN (WHO group3); he quit  smoking 10/2015 & is using home O2 3L/min, NEBS w/ Albut tid, Advair250Bid, Spiriva daily, Mucinex etc; he has acute bronchitis exac- Rx w/ Levaquin x7d, continue Home O2 24/7 and push to start Pulm Rehab...   HE HAS ESTABLISHED PRIMARY [name] w/ [name], GKirkpatrickw/ [name] et al >>   HYPERTENSION (ICD-401.9) - controlled on METOPROLOL  99mTid,  NORVASC 152md, LISINOPRIL 4079m...   ~  11/11:  BP=122/70, not checking BP's at home... denies HA, fatigue, visual changes, CP, palipit, dizziness, syncope, dyspnea, edema, etc. ~  5/12:  BP= 120/72, pt very pleased w/ his wt loss & improved exercise ability. ~  6/13:  BP= 122/64 & feeling well w/o CP, palpit, SOB, edema, etc... ~  12/13:  BP=122/82 & he denies CP, palpit, SOB, edema... ~  7/14: on Metop25-3/d, Amlod10, Lisin40; BP=130/78 & he denies CP, palpit, SOB, edema ~  1/15: BP remains stable on Metop25-2AM & 1PM, Amlod5, Lisin40; BP= 120/72 and he denies CP, palpit, ch in SOB, edema. ~  2016> His meds have been adjusted- now on Rx w/ Amlod10, Lisin10; BP= 130/72 7 he denies CP, palpit, edema; chr SOB/DOE due to his COPD.  CORONARY ARTERY DISEASE (ICD-414.00) - ASA 44m27m& off Plavix as of 2/13 officially as he wasn't taking it regularly anyway. ~  Hx prev IWMI and PTCA/ stent in RCA by DrBrodie 12/95 & 5/96...  ~  Myoview 4/08 abnormal w/ inferior ischemia and subseq cath revealing restenoses in RCA- s/p repeat PTCA/ stents...  ~  f/u Myoview 9/08 w/ prev infer infarct, no ischemia, EF=56%... ~  9/11:  pre op cardiac eval DrBrodie> OK to hold ASA/ Plavix for bladder surg. ~  2/12: f/u [name] w/ hx CAD, MI in 95, mult PCIs last 2009> stable, on ASA/ Plavix, no changes made; EKG= NSR, WNL... ~  2/13:  He had f/u [name] w/ Myoview showing a fixed defect in infer wall, no ischemia, EF=58%; pt wasn't taking Plavix, therefore stopped in favor of ASA44mg79m. ~  3/14:  He had f/u [name]> CAD, IWMI 1995, mult PCIs, last  Myoview 2013 was low risk; too sedentary, no changes made; 73 y/25brother died recently w/ AAA rupture... ~  EKG 3/14 showed NSR, rate65, WNL, NAD... ~  He maintains f/u w/ CARDS, [name]- known CAD, Hx inferior MI 1995, s/p mult PCIs- most recent 2009; finally qit [facility]; on ASA, statin, Amlod & Lisin.  HYPERCHOLESTEROLEMIA (Mixed Hyperlipidemia) - prev on Simva80 but DrBrodie decr him to SIMVASTATIN 20mg/59mFish Oil & Red Wine qd... ~  FLP 9/[place] Simva40 showed TChol 151, TG 106, HDL 23, LDL 107 ~  FLP 8/09 on Simva80 showed TChol 142, TG 135, HDL 28, LDL 87 ~  FLP 6/10 on Simva80 showed TChol 151, TG 117, HDL 31, LDL 97 ~  FLP in hosp 10/10 showed TChol 128, TG 110, HDL 26, LDL 80 ~  FLP 5/11 on Simva80 showed TChol 144, TG 215, HDL 26, LDL 89 ~  9/11:  DrBrodie decr him to Simva20... we discussed the need for better diet & wt reduction. ~  FLP 5/12 on Simva20 showed TChol 162, TG 159, HDL 32, LDL 98 ~  FLP 6/13 on Simva20 showed TChol 130, TG 102, HDL 33, LDL 77... rec to incr exercise program & get wt down! ~  FLP 7/14 on Simva20 showed TChol 158, TG 145, HDL 33, LDL 96... He has gained way too much wt 7 must get on diet! ~  Followed by PCP- [name]...  DIABETES MELLITUS, BORDERLINE (ICD-790.29) - on diet alone... ~  labs 9/08 showed BS=100, HgA1c=6.1 ~  labs 8/09 showed BS= 107, A1c= 6.2 ~  labs 6/10 showed BS= 108, A1c= 6.2 ~  lab 5/11 (wt=244#) showed BS= 121. A1c= 6.7... he Marland Kitchenas told "get wt down, or start  meds". ~  Labs 5/12 showed BS= 109, A1c= 6.1 ~  Labs 6/13 showed BS= 114, & he needs to lose the weight... ~  Labs 7/14 on diet alone showed BS= 120, A1c= 6.7 ~  Followed by PCP- [name]...  OBESITY (ICD-278.00) - weight was down as low as 202# in 2/09, and steadily incr to 244# by 5/11... ~  weight 5/11 = 244#.[name].  we reviewed diet + exercise necessary to lose weight... ~  weight 11/11 = 217# (after dx & rx for bladder  cancer) ~  Weight 5/12 = 214# but says it's 192# at home... ~  Weight 6/13 = 211# but knows it's better since his pant/ belt is down to 36" (under his roll). ~  Weight 7/14 = 230# but he has quit smoking he says, now must lose the weight... ~  Weight 1/15 = 229# ~  Weight 1/17 = 216# ~  Weight 3/17 = 231# and we reviewed diet, exercise, wt reducing strategies...  GASTRITIS (ICD-535.50) - had EGD 10/05 showing gastritis... on RANITADINE 335m/d due to his Plavix Rx, off prev Omep.  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 ~  4/12:  Pt reported f/u cysto was neg, doing well... ~  10/12:  Note from [name] reviewed> cysto was neg, no recurrent tumors... ~  4/13:  Note from [name] reviewed> neg f/u cysto w/o recurrent TCCa... He wants to continue Q6mo8motoscopies... ~  1/14:  He had f/u w/ [name]> Hx TCCa bladder, s/p TURBT 9/11, given BCG 11/11-12/11 and f/u Cysto 1/12 was neg; repeat cysto 1/14 was neg as well...  DEGENERATIVE JOINT DISEASE (ICD-715.90) - He has c/o right shoulder pain, hip pain, knee pain, and LBP> Rx ADVIL w/ some benefit... offered Ortho referral & he will decide.  ANXIETY (ICD-300.00) - on ALPRAZOLAM 0.5mg 71m...   Past Surgical History:  Procedure Laterality Date  . cataract surgery  septemeber 2013  . cataract surgery/sub posterior vitrectomy in left eye  1996   Dr. RankiZadie RhineTCA  [phone] Encounter Medications as of 07/13/2018  Medication Sig  . acetaminophen (TYLENOL) 325 MG tablet Take 2 tablets (650 mg total) by mouth every 6 (six) hours as needed for mild pain  (or Fever >/= 101).  . Albuterol Sulfate (PROAIR RESPICLICK) 108 (578Base) MCG/ACT AEPB Inhale 2 puffs into the lungs every 6 (six) hours as needed (shortness of breath).  . ALPRAZolam (XANAX) 0.5 MG tablet Take 0.5 mg by mouth at bedtime.   . budesonide (PULMICORT) 0.25 MG/2ML nebulizer solution Take 2 mLs (0.25 mg total) by nebulization 2 (two) times daily.  . cholecalciferol (VITAMIN D) 400 units TABS tablet Take 400 Units by mouth at bedtime.  . furosemide (LASIX) 40 MG tablet Take 40 mg by mouth daily.   . guaMarland KitchenFENesin (MUCINEX) 600 MG 12 hr tablet Take 2 tablets (1,200 mg total) by mouth 2 (two) times daily.  . iprMarland Kitchentropium-albuterol (DUONEB) 0.5-2.5 (3) MG/3ML SOLN Take 3 mLs by nebulization 4 (four) times daily.  . isosorbide mononitrate (IMDUR) 30 MG 24 hr tablet Take 30 mg by mouth daily.  .[name]  levofloxacin (LEVAQUIN) 500 MG tablet Take 1 tablet (500 mg total) by mouth daily.  [name] lisinopril (PRINIVIL,ZESTRIL) 10 MG tablet Take 10 mg by mouth daily.  . methocarbamol (ROBAXIN) 500 MG tablet TAKE 1 TO 2 TABLETS BY MOUTH TWICE A DAY AS NEEDED FOR MUSCLE CRAMPS  . multivitamin-lutein (OCUVITE-LUTEIN) CAPS capsule Take 1 capsule by mouth every evening.   . nitroGLYCERIN (NITROSTAT) 0.4 MG SL tablet Place 1 tablet (0.4 mg total) under the tongue every 5 (five) minutes as needed for chest pain.  [name] ondansetron (ZOFRAN) 4 MG tablet Take 1 tablet (4 mg total) by mouth every 6 (six) hours as needed for nausea.  Vladimir Faster Glycol-Propyl Glycol (SYSTANE OP) Apply 1 drop to eye daily as needed (dry eyes).  . Potassium 99 MG TABS Take 1 tablet by mouth 2 (two) times daily.  . simvastatin (ZOCOR) 20 MG tablet Take 1 tablet (20 mg total) by mouth daily at 6 PM.  . sodium chloride (OCEAN) 0.65 % SOLN nasal spray Place 1 spray into both nostrils as needed for congestion.  . vitamin B-12 (CYANOCOBALAMIN) 1000 MCG tablet Take 1,000 mcg by mouth daily.  . [DISCONTINUED] amoxicillin-clavulanate (AUGMENTIN) 875-125  MG tablet Take 1 tablet by mouth 2 (two) times daily.  . [DISCONTINUED] ISOSORBIDE PO Take by mouth daily.  . pantoprazole (PROTONIX) 40 MG tablet Take 1 tablet (40 mg total) by mouth daily.  . predniSONE (DELTASONE) 20 MG tablet 1tabtwicedailyx4day,1dailyx4days,1/2tabx4days,1/2tab everyotherday til gone   No facility-administered encounter medications on file as of 07/13/2018.     Allergies  Allergen Reactions  . Amlodipine Swelling    Current Medications, Allergies, Past Medical History, Past Surgical History, Family History, and Social History were reviewed in [employer] record.    Review of Systems        See HPI - all other systems neg except as noted... The patient complains of some dyspnea on exertion.  The patient denies anorexia, fever, weight loss, weight gain, vision loss, decreased hearing, hoarseness, chest pain, syncope, peripheral edema, prolonged cough, headaches, hemoptysis, abdominal pain, melena, hematochezia, severe indigestion/heartburn, hematuria, incontinence, muscle weakness, suspicious skin lesions, transient blindness, difficulty walking, depression, unusual weight change, abnormal bleeding, enlarged lymph nodes, and angioedema.     Objective:   Physical Exam    WD, Obese, 72 y/o WM in NAD... Vital Signs:  Reviewed... GENERAL:  Alert & oriented; pleasant & cooperative... HEENT:  /AT, EOM-wnl, PERRLA, EACs-clear, TMs-wnl, NOSE- sl congestion, THROAT-clear & wnl. NECK:  Supple w/ fairROM; no JVD; normal carotid impulses w/o bruits; no thyromegaly or nodules palpated; no lymphadenopathy. CHEST:  decr BS at bases bilat w/ few scat rhonchi, no wheezing/ rales/ or signs of consolidation... HEART:  Regular Rhythm; without murmurs/ rubs/ or gallops detected... ABDOMEN:  Obese, soft, & nontender; sm knot palpated in mid abd above umbilicus; normal bowel sounds; no organomegaly detected. EXT: without deformities, mild arthritic changes; no  varicose veins/ +venous insuffic/ no edema. NEURO:  CN's intact; no focal neuro deficits... DERM:  No lesions noted; no rash etc...  RADIOLOGY DATA:  Reviewed in the EPIC EMR & discussed w/ the patient...  LABORATORY DATA:  Reviewed in the EPIC EMR & discussed w/ the patient...   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  11/21/15>  referred for PulmRehab, contact [facility] re- POC, he still needs Full PFTs & CT Chest... We plan ROV in 6 wks 02/02/16> We discussed continuing the NEBS Tid followed by Advair250Bid & Spiriva daily; add Mucinex 1235mBid & we will Rx w/ Levaquin500/d x7d for his bronchitic infection (see AVS);  He still needs a CT Chest but wants to wait for now;  [facility] and he wants diff DME supplier- we will inquire, plan ROV 317mo/21/17>   The Ad[place] too $$ so we switched to NEBS w/ Duoneb Qid & Budes Bid; added ProairHFA prn; he desperately needs to lose wt in conjunction w/ his PulmRehab etc... 08/05/16>   He presents w/ a COPD exac-- Rx w/ Levaquin, Pred20-4d taper + NEBS w/ duoneb Qid & budes Bid... 11/24/16>   ErJaquelyn Bitteras severe COPD/emphysema w/ pulmHTN & CorPulmonale; he refuses to fill inhalers due to costs & he is therefore on O2- 24/7, BiPAP nightly, NEBS w/ Duoneb Qid & Budes Bid, Mucinex 1200Bid, & asked to get into a regular exercise program;  He will continue regular f/u w/ CARDS- [name]; must work on weight reduction... 01/27/17>   ErKhyleas severe end-stage COPD- mixed chr bronchitis & emphysema w/ chr hypoxemia & hypercarbic resp failure, w/ Hx  pulmHTN & right heart failure; s/p Hosp & now improved and approaching his baseline on max meds=> O2 at 2-4L/min 24/7 (he has [employer] that supplies 2L/min continuous or up to 6L/min pulse dose flow), nocturnal BiPAP, NEBs w/ Duoneb Qid & Budes 0.25Bid, Mucinex 120076md, weaning oral Prednisone from his recent hosp, finished oral Levaquin; on ASA81, Lasix40Bid, Avapro75, Imdur30, K20, Zocor20, and off prev Metoprolol... We discussed continuing the Pred taper til gone, continue other meds the same for now & ROV recheck 82mo67moNote: he has been followed for Cards by [name] (HBP, CAD w/ IWMI 1995 & mult PCIs, HL) and last seen in 201`5 but he saw ScotRichardson Doppin 5-04/2016... 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 10/10/17>   I would like to see a BiPAP download, continue current meds the same & f/u scans as planned at [facility] will recheck pt in 7mo,70moner if needed prn... 01/10/18>   He is at baseline from the pulm standpoint- continue current Opxygen, BiPAP Qhs, Duoneb Qid & Budes Bid;  [facility] 04/12[facility]>   [name] has on-going issues w/ the intra-abd sarcoma (s/p [facility]RT [facility]) [facility]planned ablations by IR at [facility]);[facility] was anemic & required Tx raising the concern for bleeding- DrHolMarshvillelogy are aware;  Continue same pulmonary Rx. 07/13/18>   [name] has been thru it- known large intra-abd sarcoma, mets to liver, adrenal,?lung and s/p IR ablation therapy to liver & adrenal met; then active GI bleed July=>Aug as above; now c/o sinus infection & COPD  exac for  which we will treat w/ Levaquin & Pred course... 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 GU>   Followed by [name]   Patient's Medications  New Prescriptions   LEVOFLOXACIN (LEVAQUIN) 500 MG TABLET    Take 1 tablet (500 mg total) by mouth daily.   PREDNISONE (DELTASONE) 20 MG TABLET    1tabtwicedailyx4day,1dailyx4days,1/2tabx4days,1/2tab everyotherday til gone  Previous Medications   ACETAMINOPHEN (TYLENOL) 325 MG TABLET    Take 2 tablets (650 mg total) by mouth every 6 (six) hours as needed for mild pain (or Fever >/= 101).   ALBUTEROL SULFATE (PROAIR RESPICLICK) 1978(90 BASE) MCG/ACT AEPB    Inhale 2 puffs into the lungs every 6 (six) hours as needed (shortness of breath).   ALPRAZOLAM (XANAX) 0.5 MG TABLET    Take 0.5 mg by mouth at bedtime.    BUDESONIDE (PULMICORT) 0.25 MG/2ML NEBULIZER SOLUTION    Take 2 mLs (0.25 mg total) by nebulization 2 (two) times daily.   CHOLECALCIFEROL (VITAMIN D) 400 UNITS TABS TABLET    Take 400 Units by mouth at bedtime.   FUROSEMIDE (LASIX) 40 MG TABLET    Take 40 mg by mouth daily.    GUAIFENESIN (MUCINEX) 600 MG 12 HR TABLET    Take 2 tablets (1,200 mg total) by mouth 2 (two) times daily.   IPRATROPIUM-ALBUTEROL (DUONEB) 0.5-2.5 (3) MG/3ML SOLN    Take 3 mLs by nebulization 4 (four) times daily.   ISOSORBIDE MONONITRATE (IMDUR) 30 MG 24 HR TABLET    Take 30 mg by mouth daily.   LISINOPRIL (PRINIVIL,ZESTRIL) 10 MG TABLET    Take 10 mg by mouth daily.   METHOCARBAMOL (ROBAXIN) 500 MG TABLET    TAKE 1 TO 2 TABLETS BY MOUTH TWICE A DAY AS NEEDED FOR MUSCLE CRAMPS   MULTIVITAMIN-LUTEIN (OCUVITE-LUTEIN) CAPS CAPSULE    Take 1 capsule by mouth every evening.    NITROGLYCERIN (NITROSTAT) 0.4 MG SL TABLET    Place 1 tablet (0.4 mg total) under the tongue every 5 (five) minutes  as needed for chest pain.   ONDANSETRON (ZOFRAN) 4 MG TABLET    Take 1 tablet (4 mg total) by mouth every 6 (six) hours as needed for nausea.   PANTOPRAZOLE (PROTONIX) 40 MG TABLET    Take 1 tablet (40 mg total) by mouth daily.   POLYETHYL GLYCOL-PROPYL GLYCOL (SYSTANE OP)    Apply 1 drop to eye daily as needed (dry eyes).   POTASSIUM 99 MG TABS    Take 1 tablet by mouth 2 (two) times daily.   SIMVASTATIN (ZOCOR) 20 MG TABLET    Take 1 tablet (20 mg total) by mouth daily at 6 PM.   SODIUM CHLORIDE (OCEAN) 0.65 % SOLN NASAL SPRAY    Place 1 spray into both nostrils as needed for congestion.   VITAMIN B-12 (CYANOCOBALAMIN) 1000 MCG TABLET    Take 1,000 mcg by mouth daily.  Modified Medications   No medications on file  Discontinued Medications   AMOXICILLIN-CLAVULANATE (AUGMENTIN) 875-125 MG TABLET    Take 1 tablet by mouth 2 (two) times daily.   ISOSORBIDE PO    Take by mouth daily.  NOTE>> Advair & Spiriva discontinued  due to cost & replaced by DUONEB-Qid & PULMICORT 0,77mBid via NEBULIZER..Marland KitchenMarland Kitchen

## 2018-07-18 DIAGNOSIS — R7309 Other abnormal glucose: Secondary | ICD-10-CM | POA: Diagnosis not present

## 2018-07-18 DIAGNOSIS — E7849 Other hyperlipidemia: Secondary | ICD-10-CM | POA: Diagnosis not present

## 2018-07-18 DIAGNOSIS — Z125 Encounter for screening for malignant neoplasm of prostate: Secondary | ICD-10-CM | POA: Diagnosis not present

## 2018-07-18 DIAGNOSIS — E041 Nontoxic single thyroid nodule: Secondary | ICD-10-CM | POA: Diagnosis not present

## 2018-07-24 DIAGNOSIS — I10 Essential (primary) hypertension: Secondary | ICD-10-CM | POA: Diagnosis not present

## 2018-07-24 DIAGNOSIS — E7849 Other hyperlipidemia: Secondary | ICD-10-CM | POA: Diagnosis not present

## 2018-07-24 DIAGNOSIS — R82998 Other abnormal findings in urine: Secondary | ICD-10-CM | POA: Diagnosis not present

## 2018-07-24 DIAGNOSIS — I872 Venous insufficiency (chronic) (peripheral): Secondary | ICD-10-CM | POA: Diagnosis not present

## 2018-07-24 DIAGNOSIS — Z1389 Encounter for screening for other disorder: Secondary | ICD-10-CM | POA: Diagnosis not present

## 2018-07-24 DIAGNOSIS — Z6836 Body mass index (BMI) 36.0-36.9, adult: Secondary | ICD-10-CM | POA: Diagnosis not present

## 2018-07-24 DIAGNOSIS — D62 Acute posthemorrhagic anemia: Secondary | ICD-10-CM | POA: Diagnosis not present

## 2018-07-24 DIAGNOSIS — C228 Malignant neoplasm of liver, primary, unspecified as to type: Secondary | ICD-10-CM | POA: Diagnosis not present

## 2018-07-24 DIAGNOSIS — I25118 Atherosclerotic heart disease of native coronary artery with other forms of angina pectoris: Secondary | ICD-10-CM | POA: Diagnosis not present

## 2018-07-24 DIAGNOSIS — C499 Malignant neoplasm of connective and soft tissue, unspecified: Secondary | ICD-10-CM | POA: Diagnosis not present

## 2018-07-24 DIAGNOSIS — Z9981 Dependence on supplemental oxygen: Secondary | ICD-10-CM | POA: Diagnosis not present

## 2018-07-24 DIAGNOSIS — Z23 Encounter for immunization: Secondary | ICD-10-CM | POA: Diagnosis not present

## 2018-07-24 DIAGNOSIS — Z0000 Encounter for general adult medical examination without abnormal findings: Secondary | ICD-10-CM | POA: Diagnosis not present

## 2018-07-28 DIAGNOSIS — Z1212 Encounter for screening for malignant neoplasm of rectum: Secondary | ICD-10-CM | POA: Diagnosis not present

## 2018-08-04 ENCOUNTER — Telehealth: Payer: Self-pay | Admitting: Licensed Clinical Social Worker

## 2018-08-04 NOTE — Telephone Encounter (Signed)
Palliative Care SW left a vm to set up a home visit. 

## 2018-08-13 ENCOUNTER — Telehealth: Payer: Self-pay | Admitting: Licensed Clinical Social Worker

## 2018-08-13 NOTE — Telephone Encounter (Signed)
Palliative SW spoke with patient and scheduled a visit for 9/30 at 10:30 at his home.

## 2018-08-14 ENCOUNTER — Other Ambulatory Visit: Payer: Medicare Other | Admitting: Licensed Clinical Social Worker

## 2018-08-14 ENCOUNTER — Other Ambulatory Visit: Payer: Medicare Other | Admitting: *Deleted

## 2018-08-14 DIAGNOSIS — Z515 Encounter for palliative care: Secondary | ICD-10-CM

## 2018-08-15 NOTE — Progress Notes (Signed)
COMMUNITY PALLIATIVE CARE RN NOTE  PATIENT NAME: [name] DOB: 03/02/1946 MRN: [id number]  PRIMARY CARE PROVIDER: [name], MD  RESPONSIBLE PARTY:  Acct ID - Guarantor Home Phone Work Phone Relationship Acct Type  [id number] [name]  Self P/F     [address], [place], [place] [address]    PLAN OF CARE and INTERVENTION:  1. ADVANCE CARE PLANNING/GOALS OF CARE: He wants to remain in his home with his wife and avoid going to the hospital 2. PATIENT/CAREGIVER EDUCATION: Explained Palliative Care Services, Reinforced Safe Mobility 3. DISEASE STATUS: Joint visit made with Palliative Care SW, Lynn Duffy. Upon arrival, patient sitting up in a chair on the porch. Very pleasant and conversational. Reports some soreness in his lower abdomen, area of cancer. Patient provided health history. Dyspnea noted during conversation after speaking a few sentences. Purse lip breathing noted. Also occurs with exertion. Requires frequent rest periods. He is on continuous oxygen at 6L/min via [place] during the day with activity and 4L/min during the night while asleep. He is Independent with ADLs and also prepares meals at times. His abdomen is firm and distended, but he denies any issues with nausea or constipation. He is continent of both bowel and bladder. Intake is good. Denies any dysphagia. Takes all medications whole with water. No edema noted today. He states that his 72 yo mother in law currently lives in the home, and he helps out with her as he can. He is agreeable to have monthly visits from Palliative Care Team. Will continue to monitor.   HISTORY OF PRESENT ILLNESS:  This is a 72 yo male who resides at home with his wife. Palliative Care Team to follow patient to assess overall condition and assist with symptom management needs. Next visit in 1 month.  CODE STATUS: FULL CODE ADVANCED DIRECTIVES: N MOST FORM: no PPS: 40%   PHYSICAL EXAM:   VITALS: Today's Vitals   08/14/18 1118  BP: (!) 142/64  Pulse: 80  Resp: 20  SpO2: 97%  PainSc: 0-No pain    LUNGS: decreased breath sounds CARDIAC: Cor RRR EXTREMITIES: No edema SKIN: Skin color, texture, turgor normal. No rashes or lesions  NEURO: Alert and oriented x 3, pleasant mood, generalized weakness, ambulatory w/o assistive devices   (Duration of visit and documentation 75 minutes)    [name], RN, BSN

## 2018-08-15 NOTE — Progress Notes (Signed)
COMMUNITY PALLIATIVE CARE SW NOTE  PATIENT NAME: [name] DOB: 1946/11/04 MRN: [id number]  PRIMARY CARE PROVIDER: [name], MD  RESPONSIBLE PARTY:  Acct ID - Guarantor Home Phone Work Phone Relationship Acct Type  [id number] [name]  Self P/F     [address], [place], [place] [address]     PLAN OF CARE and INTERVENTIONS:             1. GOALS OF CARE/ ADVANCE CARE PLANNING:  Patient wishes to remain at home with his wife.  He is a full code. 2. SOCIAL/EMOTIONAL/SPIRITUAL ASSESSMENT/ INTERVENTIONS:  SW and Palliative Care RN, [name], met with patient at his home.  He was on the front porch with his O2 on.  Patient was born and raised in [place], [place], on a farm.  He was a truck Nurse, children's.  He has lived on this property for 31 years, which used to be his wife's grandfather's farm.  He has been married to [name] for 27 years.  His daughter lives next door and is a Marine scientist at [employer].  [name]'s mother lives with the couple.  She is 77 y/o with the early signs of dementia.  SW provided active listening and supportive counseling when patient talked about his cancer.  He stated his faith in God is very important to him. 3. PATIENT/CAREGIVER EDUCATION/ COPING:  Patient copes by expressing his feelings openly. 4. PERSONAL EMERGENCY PLAN:  Patient will contact his daughter, who is a Therapist, sports, for medical concerns. 5. COMMUNITY RESOURCES COORDINATION/ HEALTH CARE NAVIGATION:  None. 6. FINANCIAL/LEGAL CONCERNS/INTERVENTIONS:  None per patient.     SOCIAL HX:  Social History   Tobacco Use  . Smoking status: Former Smoker    Packs/day: 2.00    Years: 45.00    Pack years: 90.00    Types: Cigarettes    Last attempt to quit: 10/17/2015    Years since quitting: 2.8  . Smokeless tobacco: Never Used  Substance Use Topics  . Alcohol use: Yes    Alcohol/week: 0.0 standard drinks    Comment: glass of wine at night    CODE STATUS:  Full Code   ADVANCED DIRECTIVES: N MOST FORM COMPLETE:  N HOSPICE EDUCATION PROVIDED:  N PPS:  Patient reports that his appetite is normal.  He is able to stand and ambulate with a walker. Duration of visit and documentation:  60 minutes.     Andrew Corn Monie Shere, LCSW

## 2018-08-16 IMAGING — CT CT CHEST W/O CM
1 series · 14 of 34 positions shown, 18 images · non-contrast
Comparison: August 19, 2016

CLINICAL DATA: Pulmonary nodules.  History of bladder carcinoma

EXAM:
CT CHEST WITHOUT CONTRAST
TECHNIQUE: Multidetector CT imaging of the chest was performed following the
standard protocol without IV contrast.

[Series 3: chest w/(date) · axial · 0.91mm/px · z∈[-454,-150]mm · 14 of 180 slices shown, 18 images]
[im 14/180  mediastinal]
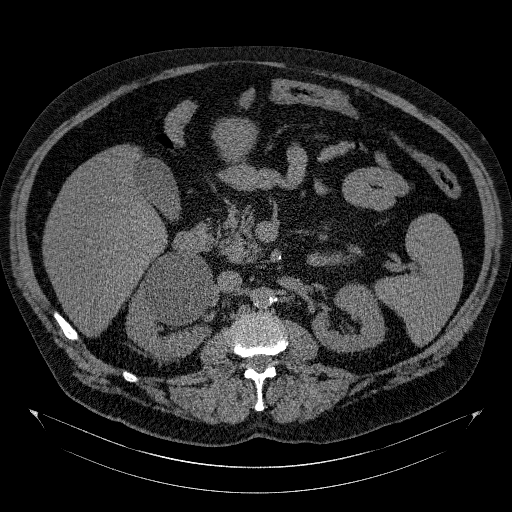
[im 14/180  lung]
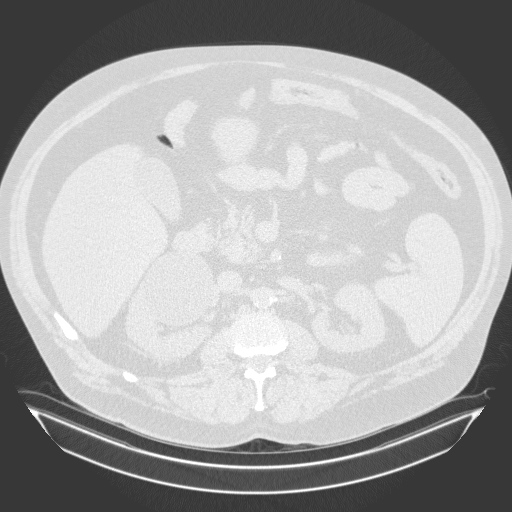
[im 27/180  lung]
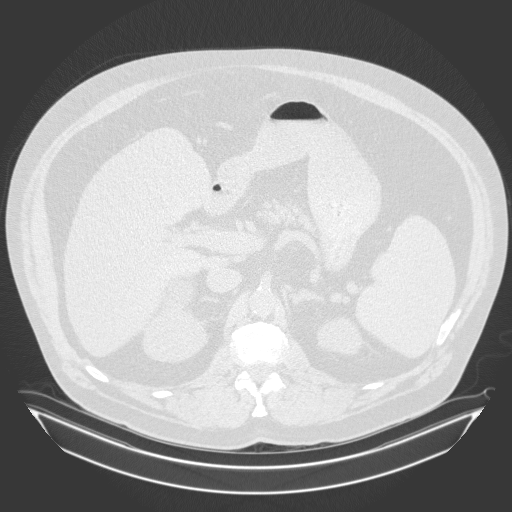
[im 36/180  lung]
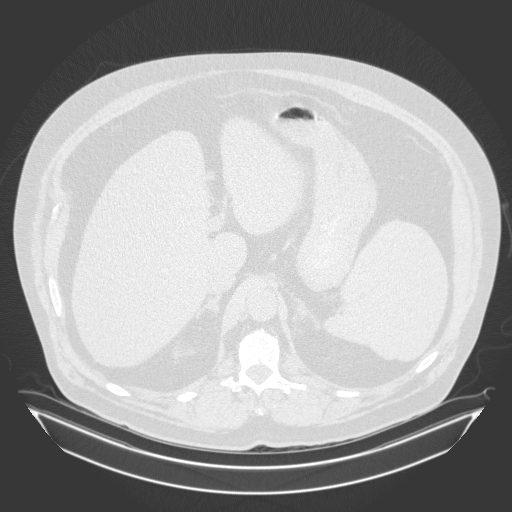
[im 54/180  lung]
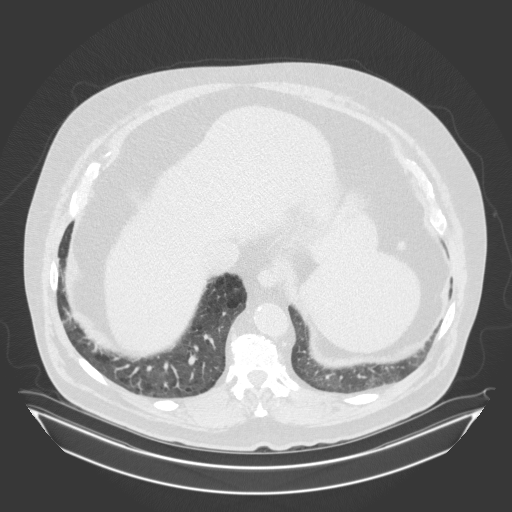
[im 67/180  mediastinal]
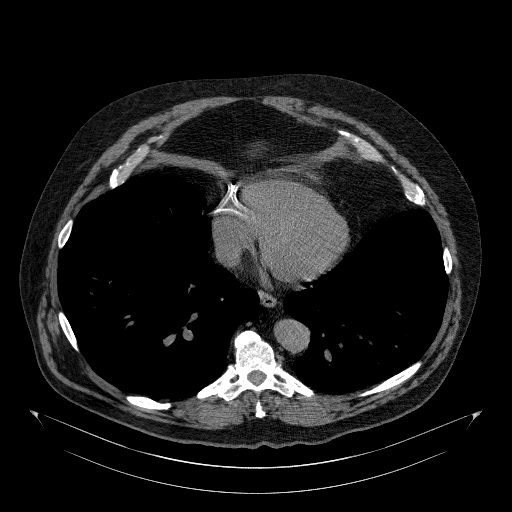
[im 67/180  lung]
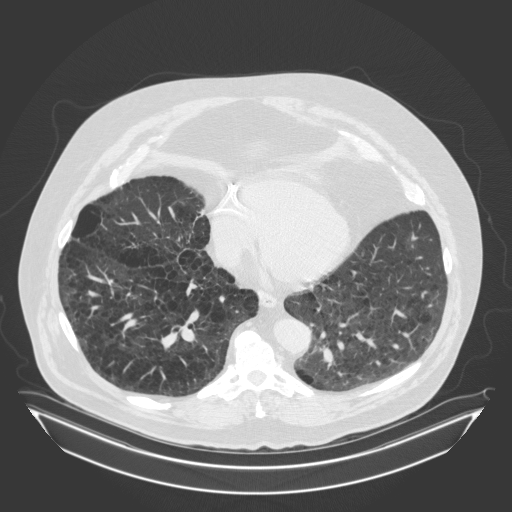
[im 73/180  lung]
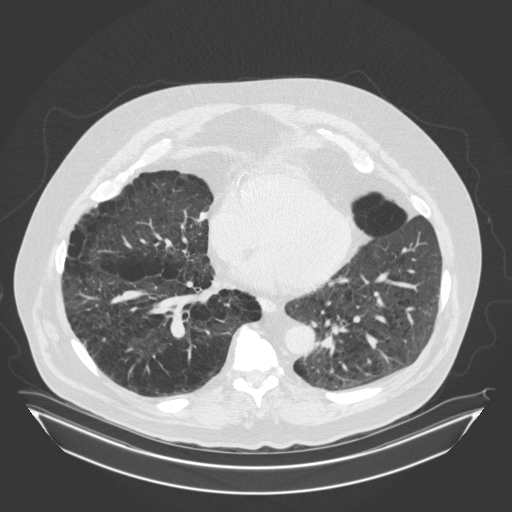
[im 86/180  lung]
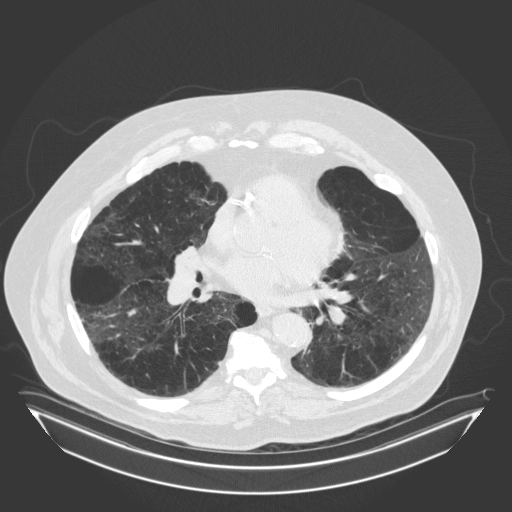
[im 95/180  lung]
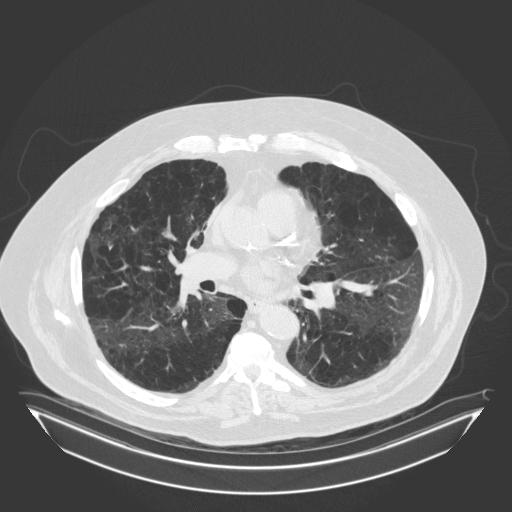
[im 107/180  mediastinal]
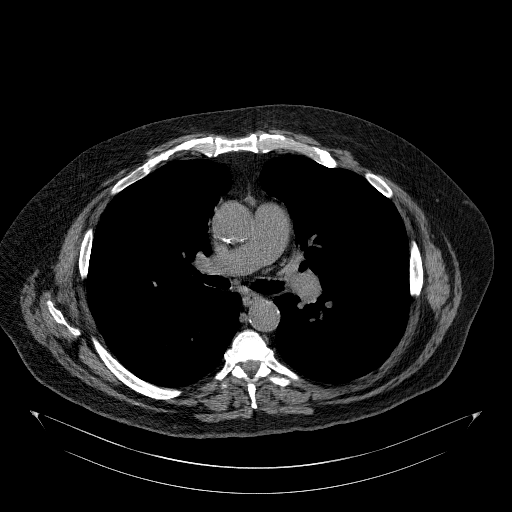
[im 107/180  lung]
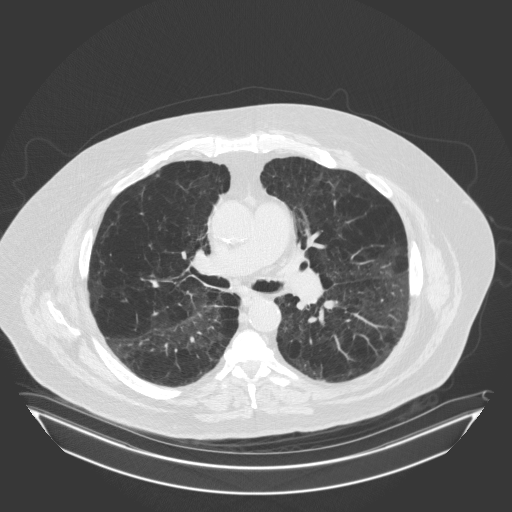
[im 113/180  lung]
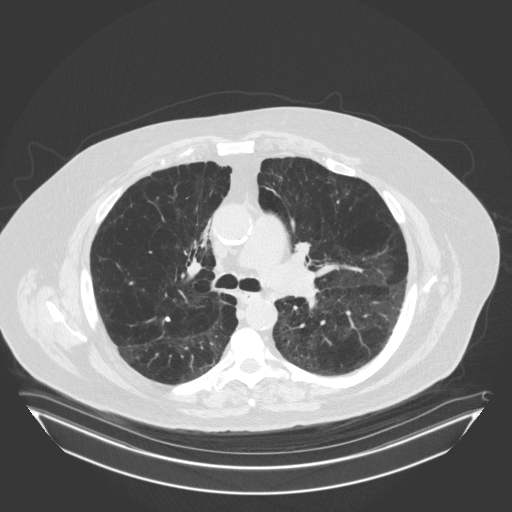
[im 133/180  lung]
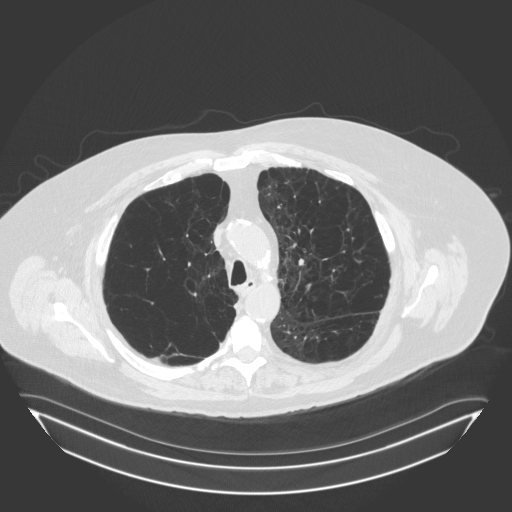
[im 144/180  lung]
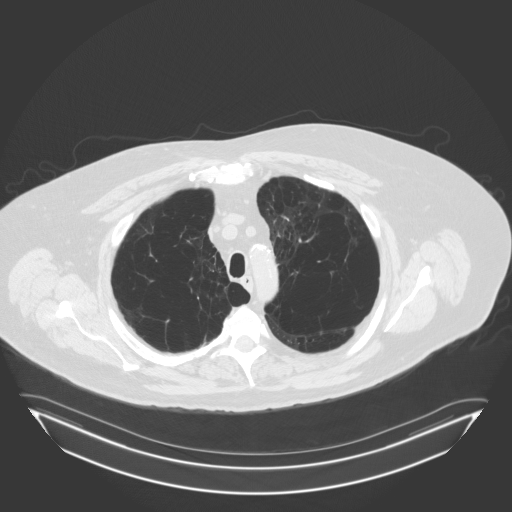
[im 153/180  mediastinal]
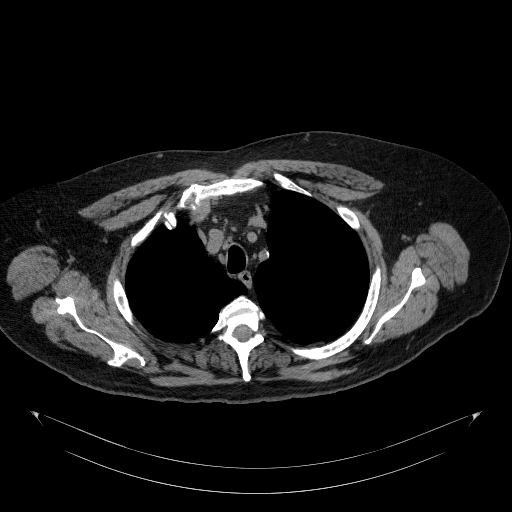
[im 153/180  lung]
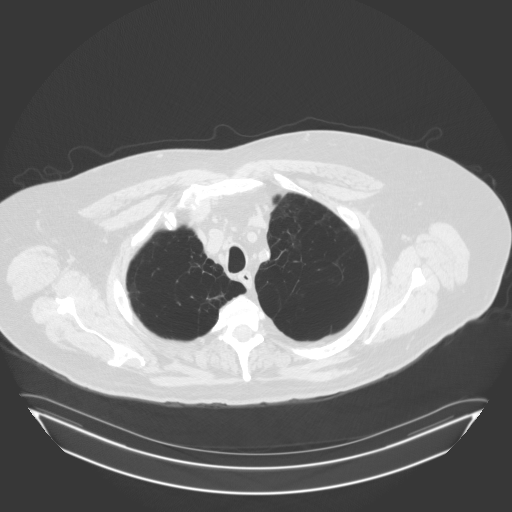
[im 166/180  lung]
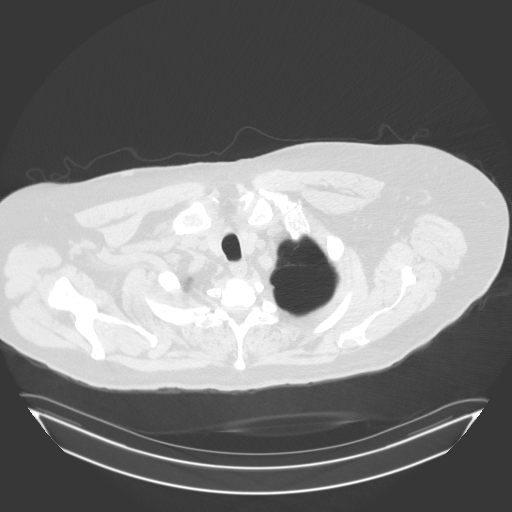

[14 of 34 positions shown; findings below may reference images not displayed]

FINDINGS: Cardiovascular: There is no demonstrable thoracic aortic aneurysm.
There is atherosclerotic calcification in the aorta. There are
multiple foci of coronary artery calcification. There is moderate
atherosclerotic calcification at the origins of the visualized great
vessels. Pericardium is not appreciably thickened. Main pulmonary
outflow tract measures 3.3 cm, essentially stable from prior study.

Mediastinum/Nodes: There are scattered subcentimeter mediastinal
lymph nodes. No adenopathy is evident. Thyroid appears unremarkable.

Lungs/Pleura: Extensive bullous emphysematous change with areas of
cicatrization remain stable. Bullous disease is most pronounced in
the upper lobe regions. Areas of mild interstitial prominence are
largely felt to be due to redistribution of blood flow to viable
lung segments. In comparison with the prior study, there is a stable
nodular opacity in the anterior segment of the right upper lobe
measuring 8 x 5 mm. On axial slice 71 series 6, there is a nodular
opacity in the lateral segment of the left lower lobe measuring 8 x
6 mm with an adjacent nodular opacity measuring 7 x 6 mm, stable.
There is a focal opacity in the inferior aspect of the posterior
segment of the left lower lobe which is stable, measuring 1.3 x
cm, seen on axial slice 130 series 6. There is no new parenchymal
lung opacity. No pleural effusion or pleural thickening evident. No
edema or consolidation.

Upper Abdomen: There is atherosclerotic calcification in the aorta.
There is incomplete visualization of a cyst arising from the
anterior right kidney measuring 4.9 x 4.7 cm. There is incomplete
visualization of a mass in the mid abdomen at the level of the
kidneys anteriorly measuring 10.5 x 10.5 cm. This mass has
attenuation values slightly higher than is expected with a simple
cyst. Its etiology is uncertain.

Musculoskeletal: There is degenerative change in the lower thoracic
spine. There are no blastic or lytic bone lesions.
IMPRESSION: Nodular opacities in the lungs remain stable. The largest opacity is
abutting the pleura in the inferior most aspect of the left lower
lobe measuring 1.3 x 0.9 cm. It is possible that this area
represents localized rounded atelectasis as opposed to a mass. Given
a lesion of this size, it may be prudent to consider additional
noncontrast enhanced chest CT examination in 3-6 months to assess
for stability.

Extensive underlying bullous emphysematous change.

No evident adenopathy. Areas of atherosclerotic calcification
including multiple foci of coronary artery calcification.

Incomplete visualization of a mass in the anterior mid abdomen
measuring 10.5 x 10.5 cm. Its etiology is uncertain. This finding
may well warrant CT of the abdomen and pelvis, ideally with oral and
intravenous contrast, to further evaluate and localize.

These results will be called to the ordering clinician or
representative by the Radiologist Assistant, and communication
documented in the PACS or zVision Dashboard.

## 2018-08-22 ENCOUNTER — Telehealth: Payer: Self-pay | Admitting: Licensed Clinical Social Worker

## 2018-08-22 NOTE — Telephone Encounter (Signed)
Palliative Care SW phoned patient and scheduled a home visit for Tuesday, 10/22, at 10am.

## 2018-08-24 DIAGNOSIS — Z6836 Body mass index (BMI) 36.0-36.9, adult: Secondary | ICD-10-CM | POA: Diagnosis not present

## 2018-08-24 DIAGNOSIS — K922 Gastrointestinal hemorrhage, unspecified: Secondary | ICD-10-CM | POA: Diagnosis not present

## 2018-08-24 DIAGNOSIS — D62 Acute posthemorrhagic anemia: Secondary | ICD-10-CM | POA: Diagnosis not present

## 2018-08-24 DIAGNOSIS — Z7189 Other specified counseling: Secondary | ICD-10-CM | POA: Diagnosis not present

## 2018-08-24 DIAGNOSIS — J449 Chronic obstructive pulmonary disease, unspecified: Secondary | ICD-10-CM | POA: Diagnosis not present

## 2018-08-24 DIAGNOSIS — M6281 Muscle weakness (generalized): Secondary | ICD-10-CM | POA: Diagnosis not present

## 2018-08-24 DIAGNOSIS — C499 Malignant neoplasm of connective and soft tissue, unspecified: Secondary | ICD-10-CM | POA: Diagnosis not present

## 2018-08-24 DIAGNOSIS — Z9981 Dependence on supplemental oxygen: Secondary | ICD-10-CM | POA: Diagnosis not present

## 2018-08-24 DIAGNOSIS — C228 Malignant neoplasm of liver, primary, unspecified as to type: Secondary | ICD-10-CM | POA: Diagnosis not present

## 2018-08-28 DIAGNOSIS — R740 Nonspecific elevation of levels of transaminase and lactic acid dehydrogenase [LDH]: Secondary | ICD-10-CM | POA: Diagnosis not present

## 2018-09-05 ENCOUNTER — Other Ambulatory Visit: Payer: Medicare Other | Admitting: *Deleted

## 2018-09-05 ENCOUNTER — Other Ambulatory Visit: Payer: Medicare Other | Admitting: Licensed Clinical Social Worker

## 2018-09-05 DIAGNOSIS — Z515 Encounter for palliative care: Secondary | ICD-10-CM

## 2018-09-07 NOTE — Progress Notes (Signed)
COMMUNITY PALLIATIVE CARE SW NOTE  PATIENT NAME: [name] DOB: 05-30-1946 MRN: [id number]  PRIMARY CARE PROVIDER: [name], MD  RESPONSIBLE PARTY:  Acct ID - Guarantor Home Phone Work Phone Relationship Acct Type  [id number] [name]639 [phone]  Self P/F     [address], [place],  [address]     PLAN OF CARE and INTERVENTIONS:             1. GOALS OF CARE/ ADVANCE CARE PLANNING:  Patient's goal is to remain at home with his wife.  He is a full code. 2. SOCIAL/EMOTIONAL/SPIRITUAL ASSESSMENT/ INTERVENTIONS:  SW and Palliative Care RN, [name], met with patient and his wife, [name], at their home.  Patient was on his front porch.  He was wearing his O2 and displayed pierced breathing.  He stated he may have over exerted himself yesterday.  Reviewed patient's goals.  Provided active listening and supportive counseling while patient discussed his previous medical treatment.  He expressed having some animosity.  Patient denied pain.  He continues to believe in miracles and his faith provides him hope. 3. PATIENT/CAREGIVER EDUCATION/ COPING:  Patient copes by expressing his feelings openly.  He relaxes by sitting on his front porch. 4. PERSONAL EMERGENCY PLAN:  Patient will contact his daughter for medical emergencies.  She is an Therapist, sports and lives next door. 5. COMMUNITY RESOURCES COORDINATION/ HEALTH CARE NAVIGATION:  None 6. FINANCIAL/LEGAL CONCERNS/INTERVENTIONS:  None     SOCIAL HX:  Social History   Tobacco Use  . Smoking status: Former Smoker    Packs/day: 2.00    Years: 45.00    Pack years: 90.00    Types: Cigarettes    Last attempt to quit: 10/17/2015    Years since quitting: 2.8  . Smokeless tobacco: Never Used  Substance Use Topics  . Alcohol use: Yes    Alcohol/week: 0.0 standard drinks    Comment: glass of wine at night    CODE STATUS:   Full Code  ADVANCED DIRECTIVES: N MOST FORM COMPLETE:  N HOSPICE EDUCATION PROVIDED:  N PPS:  Patient  reports his appetite is normal.  He stands and ambulates independently. Duration of visit and documentation:  75 minutes.      Creola Corn Elgene Coral, LCSW

## 2018-09-07 NOTE — Progress Notes (Signed)
COMMUNITY PALLIATIVE CARE RN NOTE  PATIENT NAME: MARCELLA [name] DOB: Nov 11, 1946 MRN: [id number]  PRIMARY CARE PROVIDER: [name], MD  RESPONSIBLE PARTY:  Acct ID - Guarantor Home Phone Work Phone Relationship Acct Type  [id number] [name]  Self P/F     [address], [place], [place] [address]    PLAN OF CARE and INTERVENTION:  1. ADVANCE CARE PLANNING/GOALS OF CARE: Remain at home with his wife and avoid hospitalizations 2. PATIENT/CAREGIVER EDUCATION: Chief Operating Officer and Safe Mobility 3. DISEASE STATUS: Joint visit made with Palliative Care SW, Lynn Duffy. Met with patient and his wife on the front porch for visit. He is very pleasant and conversational. Denies pain. No dyspnea noted at rest or during conversation. Mainly occurs with exertion e.g ambulating less than 54f. He wears 4L of Oxygen during the night and increased to 6L during the day when more active. He is ambulatory and remains able to perform ADLs independently. Reports that his last Hgb was low  In the lower 8s, and he is to visit this week to have Hgb rechecked. He has been experiencing at times dark tarry stools. Intake is good. He is continent of both bowel and bladder. Noticed some dizziness, unsteadiness and dyspnea during visit, when he stood up rapidlly to call for his wife to come outside. Reinforced changing positions slowly to try to prevent these symptoms. He would like to have another visit with his Oncologist, Dr. 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 He denies any abdominal pain/discomfort or constipation. Wife has an order from his PCP for a lightweight wheelchair. Information provided on medical equipment supply stores that could assist with getting him a w/c. Will continue to monitor.   HISTORY OF PRESENT ILLNESS:  This is a 72yo male who resides at home with his wife. Palliative Care Team continues to follow. Next visit in 1  month.   CODE STATUS: FULL CODE ADVANCED DIRECTIVES: N MOST FORM: no PPS: 50%   PHYSICAL EXAM:   VITALS: Today's Vitals   09/05/18 1048  BP: (!) 120/58  Pulse: 87  Resp: 18  Temp: (!) 96.1 F (35.6 C)  TempSrc: Temporal  SpO2: 95%  PainSc: 0-No pain    LUNGS: clear to auscultation  CARDIAC: Cor RRR EXTREMITIES: Trace edema noted to R foot/ankle SKIN: No skin issues, exposed skin dry and intact  NEURO: Alert and oriented x 3, pleasant mood, ambulatory   (Duration of visit and documentation 75 minutes)    MDaryl Eastern RN, BSN

## 2018-09-08 DIAGNOSIS — Z7189 Other specified counseling: Secondary | ICD-10-CM | POA: Diagnosis not present

## 2018-09-08 DIAGNOSIS — J449 Chronic obstructive pulmonary disease, unspecified: Secondary | ICD-10-CM | POA: Diagnosis not present

## 2018-09-08 DIAGNOSIS — C499 Malignant neoplasm of connective and soft tissue, unspecified: Secondary | ICD-10-CM | POA: Diagnosis not present

## 2018-09-08 DIAGNOSIS — D62 Acute posthemorrhagic anemia: Secondary | ICD-10-CM | POA: Diagnosis not present

## 2018-09-12 ENCOUNTER — Ambulatory Visit (HOSPITAL_COMMUNITY)
Admission: RE | Admit: 2018-09-12 | Discharge: 2018-09-12 | Disposition: A | Discharge status: Home / Self Care | Payer: Medicare Other | Source: Ambulatory Visit | Attending: Internal Medicine | Admitting: Internal Medicine

## 2018-09-12 DIAGNOSIS — D649 Anemia, unspecified: Secondary | ICD-10-CM | POA: Insufficient documentation

## 2018-09-12 LAB — PREPARE RBC (CROSSMATCH)

## 2018-09-12 MED ORDER — FUROSEMIDE 10 MG/ML IJ SOLN
20.0000 mg | Freq: Once | INTRAMUSCULAR | Status: AC
  Administered 2018-09-12: 20 mg via INTRAVENOUS
  Filled 2018-09-12: qty 2

## 2018-09-12 MED ORDER — IPRATROPIUM-ALBUTEROL 0.5-2.5 (3) MG/3ML IN SOLN
3.0000 mL | Freq: Once | RESPIRATORY_TRACT | Status: AC
  Administered 2018-09-12: 3 mL via RESPIRATORY_TRACT
  Filled 2018-09-12: qty 3

## 2018-09-12 MED ORDER — SODIUM CHLORIDE 0.9% IV SOLUTION
Freq: Once | INTRAVENOUS | Status: AC
Start: 2018-09-12 — End: 2018-09-12
  Administered 2018-09-12: 10:00:00 via INTRAVENOUS

## 2018-09-12 MED ORDER — IPRATROPIUM-ALBUTEROL 0.5-2.5 (3) MG/3ML IN SOLN
3.0000 mL | Freq: Four times a day (QID) | RESPIRATORY_TRACT | Status: DC
  Filled 2018-09-12: qty 3

## 2018-09-12 NOTE — Progress Notes (Signed)
PATIENT CARE CENTER NOTE  Diagnosis: Anemia    Provider: Dr. Ardeth Perfect    Procedure: 2 units PRBC's    Note: Patient received 2 units of blood. Lasix 20 mg IV given in between units. Patient tolerated transfusion well with no adverse reaction. Duoneb nebulizer treatment given for SOB. Vital signs stable. Discharge instructions given to patient. Patient alert, oriented and ambulatory to wheelchair at discharge. Discharged home with wife.

## 2018-09-12 NOTE — Discharge Instructions (Signed)

## 2018-09-13 ENCOUNTER — Encounter: Payer: Self-pay | Admitting: Pulmonary Disease

## 2018-09-13 ENCOUNTER — Ambulatory Visit (INDEPENDENT_AMBULATORY_CARE_PROVIDER_SITE_OTHER): Payer: Medicare Other | Admitting: Pulmonary Disease

## 2018-09-13 VITALS — BP 124/68 | HR 80 | Temp 98.5°F | Ht 66.0 in | Wt 229.8 lb

## 2018-09-13 DIAGNOSIS — C787 Secondary malignant neoplasm of liver and intrahepatic bile duct: Secondary | ICD-10-CM

## 2018-09-13 DIAGNOSIS — I5032 Chronic diastolic (congestive) heart failure: Secondary | ICD-10-CM

## 2018-09-13 DIAGNOSIS — G4733 Obstructive sleep apnea (adult) (pediatric): Secondary | ICD-10-CM | POA: Diagnosis not present

## 2018-09-13 DIAGNOSIS — J9611 Chronic respiratory failure with hypoxia: Secondary | ICD-10-CM | POA: Diagnosis not present

## 2018-09-13 DIAGNOSIS — M159 Polyosteoarthritis, unspecified: Secondary | ICD-10-CM

## 2018-09-13 DIAGNOSIS — I272 Pulmonary hypertension, unspecified: Secondary | ICD-10-CM

## 2018-09-13 DIAGNOSIS — I10 Essential (primary) hypertension: Secondary | ICD-10-CM

## 2018-09-13 DIAGNOSIS — J449 Chronic obstructive pulmonary disease, unspecified: Secondary | ICD-10-CM | POA: Diagnosis not present

## 2018-09-13 DIAGNOSIS — Z87891 Personal history of nicotine dependence: Secondary | ICD-10-CM

## 2018-09-13 DIAGNOSIS — C499 Malignant neoplasm of connective and soft tissue, unspecified: Secondary | ICD-10-CM

## 2018-09-13 DIAGNOSIS — M150 Primary generalized (osteo)arthritis: Secondary | ICD-10-CM

## 2018-09-13 DIAGNOSIS — J9612 Chronic respiratory failure with hypercapnia: Secondary | ICD-10-CM

## 2018-09-13 DIAGNOSIS — I2510 Atherosclerotic heart disease of native coronary artery without angina pectoris: Secondary | ICD-10-CM | POA: Diagnosis not present

## 2018-09-13 DIAGNOSIS — I420 Dilated cardiomyopathy: Secondary | ICD-10-CM

## 2018-09-13 LAB — TYPE AND SCREEN
ABO/RH(D): A POS
Antibody Screen: NEGATIVE
UNIT DIVISION: 0
UNIT DIVISION: 0

## 2018-09-13 LAB — BPAM RBC
BLOOD PRODUCT EXPIRATION DATE: 201911242359
Blood Product Expiration Date: 201911242359
ISSUE DATE / TIME: 201910290937
ISSUE DATE / TIME: 201910290937
Unit Type and Rh: 6200
Unit Type and Rh: 6200

## 2018-09-13 MED ORDER — IPRATROPIUM-ALBUTEROL 0.5-2.5 (3) MG/3ML IN SOLN: 3.0000 mL | Freq: Four times a day (QID) | RESPIRATORY_TRACT | 11 refills | Status: DC

## 2018-09-13 MED ORDER — BUDESONIDE 0.25 MG/2ML IN SUSP: 0.2500 mg | Freq: Two times a day (BID) | RESPIRATORY_TRACT | 11 refills | Status: DC

## 2018-09-13 NOTE — Patient Instructions (Signed)
Today we updated your med list in our EPIC system...    Continue your current medications the same...  We refilled your NEB MEDS via [facility] out of their W-S office as requested...  Call for any questions and let me know if I can be of service in any way...  We will arrange for a follow yp visit w/ one of my young partners in early Jan2020...  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    You & your family remain in my thoughts and prayers going forward.Marland KitchenMarland Kitchen

## 2018-10-03 DIAGNOSIS — C224 Other sarcomas of liver: Secondary | ICD-10-CM | POA: Diagnosis not present

## 2018-10-03 DIAGNOSIS — Z888 Allergy status to other drugs, medicaments and biological substances status: Secondary | ICD-10-CM | POA: Diagnosis not present

## 2018-10-03 DIAGNOSIS — R911 Solitary pulmonary nodule: Secondary | ICD-10-CM | POA: Diagnosis not present

## 2018-10-03 DIAGNOSIS — K7689 Other specified diseases of liver: Secondary | ICD-10-CM | POA: Diagnosis not present

## 2018-10-03 DIAGNOSIS — C494 Malignant neoplasm of connective and soft tissue of abdomen: Secondary | ICD-10-CM | POA: Diagnosis not present

## 2018-10-06 ENCOUNTER — Telehealth: Payer: Self-pay | Admitting: Licensed Clinical Social Worker

## 2018-10-06 NOTE — Telephone Encounter (Signed)
Palliative Care SW left a vm for patient to schedule a visit.

## 2018-10-09 ENCOUNTER — Telehealth: Payer: Self-pay | Admitting: Pulmonary Disease

## 2018-10-09 NOTE — Telephone Encounter (Signed)
Palliative Care SW left a vm with patient to schedule a visit.

## 2018-10-09 NOTE — Telephone Encounter (Signed)
Called and spoke with patient, he stated that he will bring the letter by to have [place] type a new letter to excuse him from [place] duty. Will await the letter. Nothing further needed.

## 2018-10-10 ENCOUNTER — Telehealth: Payer: Self-pay | Admitting: Pulmonary Disease

## 2018-10-10 NOTE — Telephone Encounter (Signed)
Received jury duty summons.  Will give to SN for excusal reason.  Will contact Patient once SN has finished.

## 2018-10-10 NOTE — Telephone Encounter (Signed)
Jury Duty letter in [employer].   LT please ad

## 2018-10-16 ENCOUNTER — Encounter: Payer: Self-pay | Admitting: Pulmonary Disease

## 2018-10-16 NOTE — Telephone Encounter (Signed)
Please advise if this has been handled.

## 2018-10-16 NOTE — Telephone Encounter (Signed)
Jury summons letter done by Bethann Goo, mailed,and copy mailed to Patient.  Nothing further at this time.

## 2018-10-16 NOTE — Telephone Encounter (Signed)
SN given jury summons.  Will contact Patient once done.

## 2018-10-17 ENCOUNTER — Telehealth: Payer: Self-pay | Admitting: Licensed Clinical Social Worker

## 2018-10-17 DIAGNOSIS — Z9981 Dependence on supplemental oxygen: Secondary | ICD-10-CM | POA: Diagnosis not present

## 2018-10-17 DIAGNOSIS — R0602 Shortness of breath: Secondary | ICD-10-CM | POA: Diagnosis not present

## 2018-10-17 DIAGNOSIS — C7801 Secondary malignant neoplasm of right lung: Secondary | ICD-10-CM | POA: Diagnosis present

## 2018-10-17 DIAGNOSIS — J440 Chronic obstructive pulmonary disease with acute lower respiratory infection: Secondary | ICD-10-CM | POA: Diagnosis present

## 2018-10-17 DIAGNOSIS — R918 Other nonspecific abnormal finding of lung field: Secondary | ICD-10-CM | POA: Diagnosis not present

## 2018-10-17 DIAGNOSIS — I110 Hypertensive heart disease with heart failure: Secondary | ICD-10-CM | POA: Diagnosis present

## 2018-10-17 DIAGNOSIS — Z8551 Personal history of malignant neoplasm of bladder: Secondary | ICD-10-CM | POA: Diagnosis not present

## 2018-10-17 DIAGNOSIS — C494 Malignant neoplasm of connective and soft tissue of abdomen: Secondary | ICD-10-CM | POA: Diagnosis not present

## 2018-10-17 DIAGNOSIS — Z9989 Dependence on other enabling machines and devices: Secondary | ICD-10-CM | POA: Diagnosis not present

## 2018-10-17 DIAGNOSIS — C499 Malignant neoplasm of connective and soft tissue, unspecified: Secondary | ICD-10-CM | POA: Diagnosis present

## 2018-10-17 DIAGNOSIS — Z801 Family history of malignant neoplasm of trachea, bronchus and lung: Secondary | ICD-10-CM | POA: Diagnosis not present

## 2018-10-17 DIAGNOSIS — R590 Localized enlarged lymph nodes: Secondary | ICD-10-CM | POA: Diagnosis not present

## 2018-10-17 DIAGNOSIS — C762 Malignant neoplasm of abdomen: Secondary | ICD-10-CM | POA: Diagnosis not present

## 2018-10-17 DIAGNOSIS — Z7951 Long term (current) use of inhaled steroids: Secondary | ICD-10-CM | POA: Diagnosis not present

## 2018-10-17 DIAGNOSIS — I272 Pulmonary hypertension, unspecified: Secondary | ICD-10-CM | POA: Diagnosis present

## 2018-10-17 DIAGNOSIS — Z79899 Other long term (current) drug therapy: Secondary | ICD-10-CM | POA: Diagnosis not present

## 2018-10-17 DIAGNOSIS — E785 Hyperlipidemia, unspecified: Secondary | ICD-10-CM | POA: Diagnosis not present

## 2018-10-17 DIAGNOSIS — C782 Secondary malignant neoplasm of pleura: Secondary | ICD-10-CM | POA: Diagnosis not present

## 2018-10-17 DIAGNOSIS — Z955 Presence of coronary angioplasty implant and graft: Secondary | ICD-10-CM | POA: Diagnosis not present

## 2018-10-17 DIAGNOSIS — K219 Gastro-esophageal reflux disease without esophagitis: Secondary | ICD-10-CM | POA: Diagnosis not present

## 2018-10-17 DIAGNOSIS — Z9861 Coronary angioplasty status: Secondary | ICD-10-CM | POA: Diagnosis not present

## 2018-10-17 DIAGNOSIS — J432 Centrilobular emphysema: Secondary | ICD-10-CM | POA: Diagnosis not present

## 2018-10-17 DIAGNOSIS — R0600 Dyspnea, unspecified: Secondary | ICD-10-CM | POA: Diagnosis not present

## 2018-10-17 DIAGNOSIS — C7972 Secondary malignant neoplasm of left adrenal gland: Secondary | ICD-10-CM | POA: Diagnosis not present

## 2018-10-17 DIAGNOSIS — I509 Heart failure, unspecified: Secondary | ICD-10-CM | POA: Diagnosis not present

## 2018-10-17 DIAGNOSIS — R9431 Abnormal electrocardiogram [ECG] [EKG]: Secondary | ICD-10-CM | POA: Diagnosis not present

## 2018-10-17 DIAGNOSIS — I248 Other forms of acute ischemic heart disease: Secondary | ICD-10-CM | POA: Diagnosis not present

## 2018-10-17 DIAGNOSIS — R0682 Tachypnea, not elsewhere classified: Secondary | ICD-10-CM | POA: Diagnosis not present

## 2018-10-17 DIAGNOSIS — Z923 Personal history of irradiation: Secondary | ICD-10-CM | POA: Diagnosis not present

## 2018-10-17 DIAGNOSIS — R079 Chest pain, unspecified: Secondary | ICD-10-CM | POA: Diagnosis not present

## 2018-10-17 DIAGNOSIS — Z66 Do not resuscitate: Secondary | ICD-10-CM | POA: Diagnosis present

## 2018-10-17 DIAGNOSIS — I5033 Acute on chronic diastolic (congestive) heart failure: Secondary | ICD-10-CM | POA: Diagnosis present

## 2018-10-17 DIAGNOSIS — J9621 Acute and chronic respiratory failure with hypoxia: Secondary | ICD-10-CM | POA: Diagnosis not present

## 2018-10-17 DIAGNOSIS — J441 Chronic obstructive pulmonary disease with (acute) exacerbation: Secondary | ICD-10-CM | POA: Diagnosis present

## 2018-10-17 DIAGNOSIS — C787 Secondary malignant neoplasm of liver and intrahepatic bile duct: Secondary | ICD-10-CM | POA: Diagnosis not present

## 2018-10-17 DIAGNOSIS — J9622 Acute and chronic respiratory failure with hypercapnia: Secondary | ICD-10-CM | POA: Diagnosis present

## 2018-10-17 DIAGNOSIS — I2510 Atherosclerotic heart disease of native coronary artery without angina pectoris: Secondary | ICD-10-CM | POA: Diagnosis present

## 2018-10-17 DIAGNOSIS — R7989 Other specified abnormal findings of blood chemistry: Secondary | ICD-10-CM | POA: Diagnosis not present

## 2018-10-17 DIAGNOSIS — J159 Unspecified bacterial pneumonia: Secondary | ICD-10-CM | POA: Diagnosis present

## 2018-10-17 DIAGNOSIS — R000 Tachycardia, unspecified: Secondary | ICD-10-CM | POA: Diagnosis not present

## 2018-10-17 DIAGNOSIS — Z87891 Personal history of nicotine dependence: Secondary | ICD-10-CM | POA: Diagnosis not present

## 2018-10-17 NOTE — Telephone Encounter (Signed)
Palliative Care SW spoke with patient and scheduled a home visit for Thursday, 12/5, at 10am.

## 2018-10-18 ENCOUNTER — Telehealth: Payer: Self-pay

## 2018-10-18 NOTE — Telephone Encounter (Signed)
Received phone call from Geryl Rankins, Case Manager at [facility], to make Palliative Care aware that patient is admitted for COPD exacerbation.

## 2018-10-19 ENCOUNTER — Other Ambulatory Visit: Payer: Medicare Other | Admitting: Licensed Clinical Social Worker

## 2018-10-19 MED ORDER — ENOXAPARIN SODIUM 40 MG/0.4ML ~~LOC~~ SOLN
40.00 | SUBCUTANEOUS | Status: DC
Start: 2018-10-24 — End: 2018-10-19

## 2018-10-19 MED ORDER — DEXTROSE 10 % IV SOLN
125.00 | INTRAVENOUS | Status: DC
Start: ? — End: 2018-10-19

## 2018-10-19 MED ORDER — INSULIN LISPRO 100 UNIT/ML ~~LOC~~ SOLN
2.00 | SUBCUTANEOUS | Status: DC
Start: 2018-10-23 — End: 2018-10-19

## 2018-10-19 MED ORDER — BUDESONIDE 0.25 MG/2ML IN SUSP
0.25 | RESPIRATORY_TRACT | Status: DC
Start: 2018-10-23 — End: 2018-10-19

## 2018-10-19 MED ORDER — PANTOPRAZOLE SODIUM 40 MG PO TBEC
40.00 | DELAYED_RELEASE_TABLET | ORAL | Status: DC
Start: 2018-10-24 — End: 2018-10-19

## 2018-10-19 MED ORDER — AZITHROMYCIN 250 MG PO TABS
500.00 | ORAL_TABLET | ORAL | Status: DC
Start: 2018-10-19 — End: 2018-10-19

## 2018-10-19 MED ORDER — GENERIC EXTERNAL MEDICATION
60.00 | Status: DC
Start: 2018-10-20 — End: 2018-10-19

## 2018-10-19 MED ORDER — GUAIFENESIN ER 600 MG PO TB12
1200.00 | ORAL_TABLET | ORAL | Status: DC
Start: 2018-10-23 — End: 2018-10-19

## 2018-10-19 MED ORDER — GENERIC EXTERNAL MEDICATION
17.00 | Status: DC
Start: ? — End: 2018-10-19

## 2018-10-19 MED ORDER — MAGNESIUM CHLORIDE 64 MG PO TBEC
128.00 | DELAYED_RELEASE_TABLET | ORAL | Status: DC
Start: 2018-10-23 — End: 2018-10-19

## 2018-10-19 MED ORDER — ATORVASTATIN CALCIUM 10 MG PO TABS
10.00 | ORAL_TABLET | ORAL | Status: DC
Start: 2018-10-23 — End: 2018-10-19

## 2018-10-19 MED ORDER — ALPRAZOLAM 0.5 MG PO TABS
0.50 | ORAL_TABLET | ORAL | Status: DC
Start: ? — End: 2018-10-19

## 2018-10-19 MED ORDER — IPRATROPIUM-ALBUTEROL 0.5-2.5 (3) MG/3ML IN SOLN
3.00 | RESPIRATORY_TRACT | Status: DC
Start: 2018-10-23 — End: 2018-10-19

## 2018-10-19 MED ORDER — MUPIROCIN 2 % EX OINT
TOPICAL_OINTMENT | CUTANEOUS | Status: DC
Start: 2018-10-19 — End: 2018-10-19

## 2018-10-19 MED ORDER — METHOCARBAMOL 500 MG PO TABS
500.00 | ORAL_TABLET | ORAL | Status: DC
Start: 2018-10-23 — End: 2018-10-19

## 2018-10-19 MED ORDER — ALBUTEROL SULFATE (2.5 MG/3ML) 0.083% IN NEBU
2.50 | INHALATION_SOLUTION | RESPIRATORY_TRACT | Status: DC
Start: ? — End: 2018-10-19

## 2018-10-19 MED ORDER — GENERIC EXTERNAL MEDICATION
1.00 | Status: DC
Start: 2018-10-19 — End: 2018-10-19

## 2018-10-23 ENCOUNTER — Telehealth: Payer: Self-pay

## 2018-10-23 NOTE — Telephone Encounter (Signed)
Phone call received from Duncan at [facility]. Patient is being discharged from hospital today.Will update primary team.

## 2018-10-24 ENCOUNTER — Telehealth: Payer: Self-pay | Admitting: Licensed Clinical Social Worker

## 2018-10-24 NOTE — Telephone Encounter (Signed)
Palliative Care SW phoned patient to schedule a home visit which is scheduled for 12/11, at 9:30am.

## 2018-10-25 ENCOUNTER — Other Ambulatory Visit: Payer: Medicare Other | Admitting: Licensed Clinical Social Worker

## 2018-10-25 ENCOUNTER — Other Ambulatory Visit: Payer: Medicare Other | Admitting: *Deleted

## 2018-10-25 DIAGNOSIS — Z515 Encounter for palliative care: Secondary | ICD-10-CM

## 2018-10-25 MED ORDER — ASPIRIN EC 81 MG PO TBEC
81.00 | DELAYED_RELEASE_TABLET | ORAL | Status: DC
Start: 2018-10-24 — End: 2018-10-25

## 2018-10-25 MED ORDER — FUROSEMIDE 40 MG PO TABS
40.00 | ORAL_TABLET | ORAL | Status: DC
Start: 2018-10-23 — End: 2018-10-25

## 2018-10-25 NOTE — Progress Notes (Signed)
COMMUNITY PALLIATIVE CARE SW NOTE  PATIENT NAME: [name] DOB: Dec 05, 1945 MRN: [id number]  PRIMARY CARE PROVIDER: [name], MD  RESPONSIBLE PARTY:  Acct ID - Guarantor Home Phone Work Phone Relationship Acct Type  [id number] [name]  Self P/F     [address], [place], [place] [address]    PLAN OF CARE and INTERVENTIONS:             1. GOALS OF CARE/ ADVANCE CARE PLANNING:  Patient wishes to remain at home with his wife and mother-in-law.  He is a full code. 2. SOCIAL/EMOTIONAL/SPIRITUAL ASSESSMENT/ INTERVENTIONS:  SW and Palliative Care RN, [name], met with patient in his home.  His mother-in-law was also present.  Per [name], she has dementia.  SW provided active listening and supportive counseling while patient discussed his recent hospital stay.  He appeared to be back to baseline.  He is a very good advocate for himself and wishes to obtain information before making decisions.  He denied pain.  He also discussed how important his faith is.  He wishes to decrease his use of O2.  RN addressed. 3. PATIENT/CAREGIVER EDUCATION/ COPING:  Patient copes by expressing his feelings openly.  He relaxes by sitting on his front porch. 4. PERSONAL EMERGENCY PLAN:  Patient will contact his daughter for medical emergencies.  She is an Therapist, sports and lives next door. 5. COMMUNITY RESOURCES COORDINATION/ HEALTH CARE NAVIGATION:  None     SOCIAL HX:  Social History   Tobacco Use  . Smoking status: Former Smoker    Packs/day: 2.00    Years: 45.00    Pack years: 90.00    Types: Cigarettes    Last attempt to quit: 10/17/2015    Years since quitting: 3.0  . Smokeless tobacco: Never Used  Substance Use Topics  . Alcohol use: Yes    Alcohol/week: 0.0 standard drinks    Comment: glass of wine at night    CODE STATUS:  Full Code  ADVANCED DIRECTIVES: N MOST FORM COMPLETE:  N HOSPICE EDUCATION PROVIDED: N PPS:  Patient reports his appetite is back to normal, but it  takes longer to eat.  He is able to ambulate independently. Duration of visit and documentation:  70 minutes.      Andrew Corn Byrdie Miyazaki, LCSW

## 2018-10-26 ENCOUNTER — Encounter: Payer: Self-pay | Admitting: Pulmonary Disease

## 2018-10-26 ENCOUNTER — Ambulatory Visit (INDEPENDENT_AMBULATORY_CARE_PROVIDER_SITE_OTHER): Payer: Medicare Other | Admitting: Pulmonary Disease

## 2018-10-26 VITALS — BP 120/62 | HR 94 | Temp 98.1°F | Ht 66.0 in | Wt 225.0 lb

## 2018-10-26 DIAGNOSIS — K769 Liver disease, unspecified: Secondary | ICD-10-CM | POA: Diagnosis not present

## 2018-10-26 DIAGNOSIS — J9612 Chronic respiratory failure with hypercapnia: Secondary | ICD-10-CM

## 2018-10-26 DIAGNOSIS — J9611 Chronic respiratory failure with hypoxia: Secondary | ICD-10-CM

## 2018-10-26 DIAGNOSIS — I2510 Atherosclerotic heart disease of native coronary artery without angina pectoris: Secondary | ICD-10-CM

## 2018-10-26 DIAGNOSIS — I5030 Unspecified diastolic (congestive) heart failure: Secondary | ICD-10-CM | POA: Diagnosis not present

## 2018-10-26 DIAGNOSIS — C787 Secondary malignant neoplasm of liver and intrahepatic bile duct: Secondary | ICD-10-CM

## 2018-10-26 DIAGNOSIS — C499 Malignant neoplasm of connective and soft tissue, unspecified: Secondary | ICD-10-CM | POA: Diagnosis not present

## 2018-10-26 DIAGNOSIS — J189 Pneumonia, unspecified organism: Secondary | ICD-10-CM | POA: Diagnosis not present

## 2018-10-26 DIAGNOSIS — G4733 Obstructive sleep apnea (adult) (pediatric): Secondary | ICD-10-CM | POA: Diagnosis not present

## 2018-10-26 DIAGNOSIS — Z951 Presence of aortocoronary bypass graft: Secondary | ICD-10-CM | POA: Diagnosis not present

## 2018-10-26 DIAGNOSIS — D62 Acute posthemorrhagic anemia: Secondary | ICD-10-CM | POA: Diagnosis not present

## 2018-10-26 DIAGNOSIS — I5032 Chronic diastolic (congestive) heart failure: Secondary | ICD-10-CM

## 2018-10-26 DIAGNOSIS — J449 Chronic obstructive pulmonary disease, unspecified: Secondary | ICD-10-CM | POA: Diagnosis not present

## 2018-10-26 DIAGNOSIS — Z9981 Dependence on supplemental oxygen: Secondary | ICD-10-CM | POA: Diagnosis not present

## 2018-10-26 MED ORDER — ALPRAZOLAM 0.5 MG PO TABS: 0.5000 mg | ORAL_TABLET | Freq: Every day | ORAL | 2 refills | Status: AC

## 2018-10-26 MED ORDER — ALBUTEROL SULFATE 108 (90 BASE) MCG/ACT IN AEPB: 2.0000 | INHALATION_SPRAY | Freq: Four times a day (QID) | RESPIRATORY_TRACT | 6 refills | Status: AC | PRN

## 2018-10-26 NOTE — Progress Notes (Signed)
Subjective:    Patient ID: [name], male    DOB: [birth year], 72 y.o.   MRN: [id number]  HPI 72 y/o WM here for a follow up visit and on-going management of mult med problems...  ~  SEE PREV EPIC NOTES FOR OLDER DATA >>    CXR 7/14 showed COPD/ emphysema, incr markings and scarring at the bases, NAD...  LABS 7/14:  FLP- ok on Simva20;  Chems- ok x BS=120 A1c=6.7;  CBC- wnl;  TSH=1.26;  PSA=0.41...  [name] has established Primary Care follow up w/ [name] at [facility]>>   CXR 7/15 showed underlying COPD, mild bibasilar fibrosis, norm heart size, DJD in spine, NAD...   PFT 7/15 showed> FVC=3.18 (78%), FEV1=1.22 (38%), %1sec=38; mid-flows=13% predicted... This is c/w GOLD Stage3 COPD and we discussed this...  ~  December 05, 2014:  14moROV & pulmonary recheck; [name] continued smoking, perhaps decr slightly to 1/2ppd but unable to quit & he has gained 6# in the interim up to 221# today;  His CC is sinusitis w/ nasal congestion, drainage & green mucus plus maxillary/ frontal HAs; he denies f/c/s, notes stable cough & chest symptoms w/ DOE;  He has been taking his Advair250Bid but prev refused addition of an anticholinergic due to "the donut hole";  States his breathing is "fine, except in cold air" ( we discussed wearing a scarf in winter etc)... He has GOLD Stage3 COPD w/ FEV1=1.22 (38%predicted) done 7/15;;  Exam shows decr BS bilat w/ few scat rhonchi & mild exp wheezing esp w/ forced maneuver...     We reviewed prob list, meds, xrays and labs> see below for updates >> [facility]... PLAN>> we discussed treating his acute max sinusitis w/ Augmentin875Bid, plus align daily & nasal saline as needed; he knows the importance of smoking cessation but is not motivated & declines smoking cessation help; he will continue the Advair250Bid 7 we are adding spiriva via handihaler once per day; we plan ROV in 6220mo/ f/u CXR  & PFT, call in the interim for any breathing issues....   ~  June 05, 2015:  20m74moV & ErnAdrianaports a good interval, no new complaints or concerns; still smoking +/-1ppd and blames family stress; notes cough, sm amy yellow sput, no hemoptysis, SOB w/o change/stable- still mows 3 yards, does chores, tends to 20+chickens & their eggs, etc; he has repeatedly declined smoking cessation help, clearly not motivated to quit, not even doing his inhalers regularly!!!    COPD, +smoker> on Advair250Bid & Spiriva daily; symptoms as above, he is stoic, still smoking 1ppd & NOT motivated to quit, asked to cut back & use both inhalers regularly...    HBP> on Metop25Bid, Amlod10, Lisin40; BP=114/64 & he denies CP, palpit, SOB, edema...    CAD> on ASA81; followed by [name] & seen 4/15 (overdue for f/u)> CAD, IWMI 1995, mult PCIs, last Myoview 2013 was low risk; too sedentary, no changes made; 73 24o brother died w/ AAA rupture...    Chol> on Simva20; FLP 7/14 showed TChol 158, TG 145, HDL 33, LDL 96; needs better diet & wt reduction and ret for Fasting blood work overdue.    DM> on diet alone; LABS 7/14 showed BS= 120, A1c= 6.7; we reviewed low carb diet, exercise program, and wt reduction...    Obese> wt recorded as 204# today but it doesn't look like he's lost 26#;  we reviewed diet & exercise...    GI- Gastritis, polyps> prev on Zantac; prev colon 2004 w/ hyperplastic polyp removed & f/u 8/14 by DrStark w/ 3 polyps removed (5-44m size, all were tubular adenomas), +divertics in sigmoid, f/u planned 5 yrs... NOTE> exam showed a small palp knot in mid-abd above the umbilicus ?etiology (I offered CT Abd but he declines & wants to discuss w/ his primary- [name]).    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    DJD> on OTC  analgesics as needed...    Anxiety> on Alpraz0.4m74mrn... We reviewed prob list, meds, xrays and labs> see below for updates >>  NOTE> HE IS FOLLOWED BY [name] for GEN MED/ PCP now...  CXR 06/05/15 showed stable heart size, Ao atherosclerosis & tortuousiy, COPD/emphysema, incr markings at the bases, arthritis in Tspine...  LABS 7/16: pt asked to ret for fasting blood work... IMP/PLAN>>  ErnLydiays he is stable but continues to smoke 1ppd; CXR w/ COPD/emphysema, atherosclerotic Ao, chronic changes but NAD- we discussed the low-dose screening CT Chest for the early detection of lung cancer (he is a candidate & he will consider participating in this program); in the meanwhile- continue Advair/ Spiriva regularly;  He is overdue for Cards f/u w/ [name], and needs to continue Q6mo39mo w/ [name] (w/ copy of notes to us);KoreaHe will ret for FASTING blood work.  ADDENDUM>> given his age and smoking history, [name] candidate for LUNG CANCER SCREENING w/ LOW-DOSE CT Chest>>  This office visit constitutes a face-to-face visit for the purpose of lung cancer screening counseling and a shared decision making visit...  Eligibility for LDCT screening:  1) Age=70 (must be 55-77y/o);  2) Absence of symptoms of lung cancer: he denies CP, ch in SOB, hemoptysis.  Smoking History:  1) He has 60+ pk-yrs (must have >30 pk-yr hx smoking);  2) Smoking status- still smoking ~1ppd (current or former smoker who quit<15 yrs ago).  Counseling:  Importance of smoking cessation &/or continued abstinence discussed ; offered smoking cessation interventions- pt declined; pt agreed to continued LDCT screening annually.  Shared decision making:  We reviewed the potential benefits and harms of screening> eg. Early detection of lung cancer and improved survival, the need for additional diagnostic tests & possible surgery, the risk of over-diagnosis/ false positives/ and radiation exposure... All questions were answered  to the best of my ability & he would like to consider the LDCT screening & he will let us kKoreaw his decision=> he ignored this discussion & never called.  ~  October 31, 2015:  5 month ROV & post hospital follow up visit>  [name] was Adm 12/5 - 10/29/15 by [facility] w/ acute hypoxemic resp failure precipitated by a COPD exac; for his part he noted incr SOB, cough, green sput, & "slowing down", he denied f/c/s, no CP, etc;  In the ER he was hypoxemic w/ O2sats in the 70s on RA and eval revealed> CXR- COPD, bullous dis, incr markings at bases, NAD; Cultures of blood/ sput/ urine were all neg; Serologies for strep & legionella were neg; Viral panel was neg;  LABs were OK (WBC=7.9=>14, Hg=15.3=>13.9, BS=100-140, BNP=498);  EKG showed NSR, rate78, NSSTTWA;  2DEcho 10/21/15 showed mildLVH, norm LVF w/ EF=55-60%, no regional  wall motion abn, LA-mod dil, RA- mod to severe dil, RV- mod dil, mod TR, PAsys-72mHg... He was treated w/ Oxygen, IV Solumedrol, Rocephin/ Zithromax, NEBS, Lovenox, flutter, etc; he was disch on Home O2- 2L/min activ & 1L/min rest, Pred taper, Levaquin, Advair250/ Spiriva...  He returns today feeling better, notes his home pulse-ox checks are improving & he has less SOB/DOE, less cough/sput, etc;  He says he has not smoked since PTA...      EXAM shows Afeb, VSS, O2sat=94% on 2L/min [place];  Wt=210# (he was 204 prev);  HEENT- neg, mallampati1;  Chest- decr BS bilat, few scat rhonchi, no w/r/consolidation;  Heart- RR w/o m/r/g;  Abd- obese, soft;  Ext- neg w/o c/c/e...  IMP/PLAN>>  COPD/emphysema, GOLD Stage 3, chronic hypoxemic resp failure- on Home O2 now, exsmoker- quit 10/2015, pulmonary HTN (PAsys by EClarene Duke- WHO group 3>  I congratulated him on not smoking & we discussed continuing the Oxygen regularly (esp [facility], Prednisone taper, Advair, Spiriva, NEBS w/ Albut, Mucinex/flutter;  He will follow up in 3 wks when the Pred has been tapered & we briefly discussed the need for Full PFTs and CT  Chest later...   ~  November 21, 2015:  3wk ROV & MrWicker reports that he feels much better, SOB improved, cough improved, still prod of a sm amt green sput w/o blood w/o f/c/s;  He's been using the NEB w/ Albut TID followed by his AdvairBid & SpirivaQd; he is off the Pred now, he is too sedentary & not exercising => he needs PULM REHAB & we will refer, [facility] we will inquire as to what he might be elig for...       EXAM shows Afeb, VSS, O2sat=98% on 2L/min [place];  Wt=216# (he was 204-210# prev);  HEENT- neg, mallampati2;  Chest- decr BS bilat, otherw clear w/o w/r/r;  Heart- RR w/o m/r/g;  Abd- obese, soft;  Ext- neg w/o c/c/e...  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 We plan ROV in 6 wks.  ~  February 03, 2016:  2-379moOV & ErSharman Crateeturns w/ his severe obstructive lung dis, GOLD Stage 3, chr hypoxemic resp failure w/ pulmHTN (WHO group3); he quit smoking 10/2015 & is using home O2 3L/min, NEBS w/ Albut tid, Advair250Bid, Spiriva daily, Mucinex etc... He reports that breathing is stable overall but he is c/o 45m17mo URI w/ cough, thick yellow sput hard to produce, incr SOB 7 chest tightness but no fever, no CP=> he wants another antibiotic... He spent much time ventilating about his problems w/ DME[facility], his O2 set-up, & [facility] program- they still haven't contacted him about starting Rehab... He does have mult entries into EPIC from THNDavidsonotes reviewed...     EXAM shows Afeb, VSS, O2sat=92% on 3L/min [place];  Wt=231# (up 15# & he was 204-210# prev);  HEENT- neg, mallampati2;  Chest- decr BS bilat, otherw clear w/o w/r/r;  Heart- RR w/o m/r/g;  Abd- obese, soft;  Ext- neg w/o c/c/e.  PFT 02/03/16>  FVC=2.57 (68%), FEV1=1.05 (38%), %1sec=41, mid-flows reduced at 18% predicted;  post bronchodil FEV1 w/o change;  TLC=6.67 (107%), RV=3.97 (176%), RV/TLC=60;  DLCO=22% and DL/VA=29%... This is c/w a severe obstructive ventilatory defect w/ air trapping and severely decr DLCO  c/w emphysema- GOLD Stage 3 COPD... IMP/PLAN>>  We discussed continuing the NEBS Tid followed by Advair250Bid & Spiriva daily; add Mucinex 1224mBid & we will Rx w/ Levaquin500/d x7d for his bronchitic infection (see AVS);  He still needs a CT Chest but wants to wait for now;  [facility], and he wants diff DME supplier- we will inquire, plan ROV 37mo. ADDENDUM 03/18/16>> Pt unable to afford inhalers- ADVAIR & SPIRIVA discontinued and placed on NEBULIZER w/ DUONEB QID & PULMICORT 0.25 BID regularly (we will update med list to reflect this change).  ~  May 05, 2016:  98m59moV & [name] notes DOE w/ exertion- he is in PulHealth Netusing up to 6L/min w/ exercise (he tried OxyGroup 1 Automotiveusing 3-4L/min at rest & Qhs; he feels that he is making some progress & I reminded him the DIET + EXERCISE and wt loss would be very beneficial to his breathing; states he is walking & mowing on his off days (ie- when not in rehab); despite everything his wt continues to climb- now up 4# to 235# "exercise makes me hungry"... we reviewed the following medical problems during today's office visit >> his new PCP is [name]...    COPD, ex-smoker, chr hypoxemic RF> on NEB w/ Duoneb Qid + Budes-Bid + HFA prn (Advair & Spiriva too $$); he finally quit smoking, symptoms as above, he is now in pulm rehab but having issues w/ low sats...    HBP> on MetopER-71m498mM&1PM), Amlod10, Lisin10, Lasix40Bid; BP=136/66 & he denies CP, palpit, SOB, edema...    CAD> on ASA81, Imdur30; followed by [name] & seen 04/2016> CAD, IWMI 1995, mult PCIs, last Myoview 2013 was low risk; too sedentary, edema decr w/ Lasix; 73 y25 brother died w/ AAA rupture...    Chol> on Simva20; FLP 12/16 showed TChol 138, TG 75, HDL 25, LDL 98; needs  better diet & wt reduction and better compliance.    DM> on diet alone; LABS 5/17 showed BS= 118, A1c= per PCP; we reviewed low carb diet, exercise program, and wt reduction...    Obese> wt = 235#, 5'6"Tall, BMI=38;  we reviewed diet & exercise...    GI- Gastritis, polyps> prev on Zantac; prev colon 2004 w/ hyperplastic polyp removed & f/u 8/14 by DrStark w/ 3 polyps removed (5-7mm 61me, all were tubular adenomas), +divertics in sigmoid, f/u planned 5 yrs... NOTE> exam showed a small palp knot in mid-abd above the umbilicus ?etiology (I offered CT Abd but he declines & wants to discuss w/ his primary- [name]).    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     DJD> on OTC analgesics as needed...    Anxiety> on Alpraz0.5mg pr598m. EXAM shows Afeb, VSS, O2sat=92% on 3L/min [place];  Wt=231# (up 15# & he was 204-210# prev);  HEENT- neg, mallampati3;  Chest- decr BS bilat, otherw clear w/o w/r/r;  Heart- RR w/o m/r/g;  Abd- obese, soft;  Ext- neg w/o c/c/e. IMP/PLAN>>  [name] is rec to continue current meds regularly, get on diet & get wt down , continue in PulmRehab, etc; we wrote for a ProairHFA rescue inhaler for prn use...   ~  August 05, 2016:  98mo ROV4moast visit we rec continuing same meds but stressed DIET, EXERCISE,  wt reduction-- unfortunately he has GAINED 8# up to 243# (BMI=39) in the interval;  He states that "I don't get hungry, I only eat one sandwich & sometimes a salad at night;  He says "they kicked me out" of the [facility] program due to low oxygens even w/ use of the Oxymizer;  He says he is exercising at home now;  He has established PCP w/ [name] & apparently he did labs which pt reports were "OK".    [name] c/o cough, chest congestion, yellow sput, ans a "sinus infection" w/ yellow drainage; notes incr  SOB but denies CP/ f/c/s, edema;   EXAM shows Afeb, VSS, O2sat=92% on 3L/min [place];  Wt=231# (up 15# & he was 204-210# prev);  HEENT- neg, mallampati3;  Chest- decr BS bilat, otherw clear w/o w/r/r;  Heart- RR w/o m/r/g;  Abd- obese, soft;  Ext- neg w/o c/c/e. IMP/PLAN>>  Shonn describes an upper resp infection & COPD exac=> Rx w/ Levaquin, Pred93m-4d taper, + his NEBS w/ duoneb Qid & Budes Bid; he desperately needs to get the weight down...  ~  October 13, 2016:  EMarrionreturns and says "I've been worse" & indicates that he really likes the BiPAP he was placed on & disch with after his last Hosp 11/10 - 09/29/16; records reviewed by me- Adm w/ cough, yellow sput, chills and incr SOB; CXR revealed COPD, incr markings & bibasilar atx; Sput/ blood cultures and resp viral panel were all neg; LABS showed WBC~13K, anemia w/ Hg~11, Chems- ok x elev BS (his PCP is [name]); BNP was 29; ABGs showed pH=7.38, pCO2=58, pO2=79 on [place] O2; note that Bicarb level ~30;  2DEcho showed EF 60-65%, Gr1DD, PAsys~768mg;  He was felt to has a COPD exac, Cor Pulmonale- treated w/ BiPAP (he was INTOL to CPAP), given IV Solumedrol (DC on Pred taper), IV Zithromax, NEB treatments and improved...    He now returns & notes that he likes the BiPAP, feels like it is really helping (resting better, sleeps thru the night, wakes refreshed, no daytime sleepiness, & naps 2/7 days now) & wants to continue the BiPAP; NOTE that we had rec a sleep study to him on mult occas but he has always refused!  He now agrees to a sleep study, hoping that it will lead to medicare approval for his home BiPAP => HE QUALIFIES FOR BiPAP BASED ON COPD DIAGNOSIS w/ ABG SHOWING pCO2>52 ON HIS NASAL CANNULA O2, he needs the SLEEP OXIMETRY STUDY confirming O2sat<88% for cumulative >65m40mwhile on his prescribed O2, and OSA/CPAP treatment have been considered & ruled out... He has a home O2conc & a POC which he uses 3-4L pulse dose w/ his home sats in the 90s at rest &  down to 88% w/ exerc...     COPD, mixed type w/ emphysema>  He has GOLD Stage3 COPD & on O2 as noted, NEBS w/ DUONEB Qid & BUDESONIDE0.25Bid, plus ProairHFA prn when out & about; off the Pred Rx given last [facility]...    Ex-smoker>  He quit late in 2016...    Chronic resp failure w/ hypoxemia and hypercarbia>  On above meds + ASA, Metoprolol, Imdur, Lasix    Cor pulmonale, secondary pulm hypertension>  He is on O2 24/7 using 4L/min continuous at night & 4L/min pulse dose during the day...    CARDIAC issues>  HBP, CAD w/ IWMI 1995 & mult PCIs, HL (on Simva20)- all followed by [name]...Marland KitchenMarland Kitchen  MEDICAL issues>  HBP, HL, DM, obesity, gastritis, divertics, colon polyps, BPH, hx bladder cancer, DJD, anxiety EXAM shows Afeb, VSS, O2sat=97% on 4L/min [place];  Wt=240#;  HEENT- neg, mallampati3;  Chest- decr BS bilat, otherw clear w/o w/r/r;  Heart- RR w/o m/r/g;  Abd- obese, soft;  Ext- neg w/o c/c/e.  CXR 09/26/16>  Norm heart size, incr bibasilar atx... IMP/PLAN>>  Pieter needs to continue his O2, NEBS w/ Duoneb & Budes regularly; he wants to continue the BiPAP & we need to see if he meets Medicare's criteria for BiPAP device- proceed w/ Sleep Study...   ADDENDUM>>  Sleep Study 10/15/16>  AHI=1.8/hr (8 hypopneas & 4 RERAs;  AHI during REM = 17.6/hr;  Study done on 4LO2 & min O2sat= 88%;  Mod snoring noted;  No arrhythmias;  PLMS index= 37, but assoc arousals was only 0.2;  He appears to meet the criteria for BiPAP & we will submit to [employer]/ [employer]... Plan ROV recheck in 6-8 weeks.  ~  November 24, 2016:  6wks ROV and pulmonary follow up visit>  Martez is stable w/ his severe disease- on BiPAP Qhs, Home O2- 24/7, NEB w/ Duoneb Qid & Budes Bid, he has rescue inhaler and [place] nasal mist;  His weight is all the way up to 257# (up 18# in 6wks) and c/o cough off & on, small amt yellow sput, no f/c/s, SOB "the same" but notes incr DOE w/ walking; unfortunately he has been very sedentary- not  exercising at all (prev in pulm rehab & they discharged him from the program)...     SEE PROBLEM LIST-- above... EXAM shows Afeb, VSS, O2sat=97% on 4L/min [place];  Wt=240#;  HEENT- neg, mallampati3;  Chest- decr BS bilat, otherw clear w/o w/r/r;  Heart- RR w/o m/r/g;  Abd- obese, soft;  Ext- neg w/o c/c/e.  LABS 11/24/16>  Chems- ok w/ BS=117, Cr=0.64, LFTs=wnl;  CBC- anemic w/ Hg=10.53mv=95, wbc-norm;  Fe=191 (sat=56%), Ferritin=109;  TSH=1.28...  Abd SONAR => ordered but never done...  Last CT Chest 08/19/16> mild cardiomeg w/ aortic & coronary atherosclerotic changes & PA enlargement w/ 3.4cm outflow tract; no adenopathy; severe bullous emphysema; bilat pulm nodules w/ 7.213mRUL nodule & 7.9 LLL lesion; Abd showssmall gallstones vs sludge & 6cm right renal cyst, mod thoracic spondylosis...  Last 2DEcho 09/29/16> mod conc LVH, norm LVF w/ EF=60-65%, no regional wall motion abn, Gr1 DD, norm valves, RV dil w/ norm sys function, PAsys=74 mmHg...  Last ONO > cannot find results IMP?PLAN>>  ErChinas severe COPD/emphysema w/ pulmHTN & CorPulmonale; he refuses to fill inhalers due to costs & he is therefore on O2- 24/7, BiPAP nightly, NEBS w/ Duoneb Qid & Budes Bid, Mucinex 1200Bid, & asked to get into a regular exercise program;  He will continue regular f/u w/ CARDS- [name]; must work on weight reduction...  ~  January 27, 2017:  61m27moV & [name] was ADM by [facility] 3/7 - 01/26/17 w/ Acute on Chronic diastolic CHF, dilated cardiomyopathy, COPD exac, acute on chr resp failure;  He was diureses 30# of fluid 257# to 227# today but he is quite vehemently opposed to this being diastolicCHF and says it was pneumonia & wouldn't have happened if he had an antibiotic when he needed it (his words); Pulm consult from DrYacoub also remarked about pt being very upset at suggestion of CHF & demanded to see pulmonary...             ...Marland KitchenMarland Kitchen CXR 01/22/17 showed cardiomegaly,  aortic atherosclerosis, bilat emphysema & interstitial  prominence in the bases, no consolidation or effusion..  EKGs 01/2017 showed NSR, poor R prog V1-2, one PVC pair...  2DEcho 01/20/17 was a poor quality study  But showed mild conc LVH (prev mod), norm LVF w/ EF=55-60% & no RWMA, Ao root mildly dil, AoV mild calcif leaflets, MV norm, LAdil 101m, RV dil, norm RVF, PAsys- not meas [id number]  LABS showed BS~130-180 range, Cr=0.8-1.1 range, Hg~10-11 range, WBC~10=>6K, Troponins- neg, BNP=35...    He was treated w/ [id number] placed on BiPAP, Oxygen, NEBS, Solumedrol, & empiric antibiotics; He was seen by Pulmonary & transitioned to oral Levaquin, Prednisone, & continued on his home regimen of Oxygen- 24/7, BiPAP Qhs, NEBS w/ DUONEB Qid & Budesonide Bid, Mucinex 12069mBid; Lasix trimmed to 4058md at disch yest... Since disch he is c/o "weak", notes some cough, sput, & SOB; we discussed keeping an Ab on hand so he can take it when he wants at the earliest sign of a resp infection- Levaquin 500m6mily #10 w/ refill written at his request, reminded to always take a probiotic like ALIGN & activia yogurt while on the broad spectrum antibiotics...     EXAM shows Afeb, VSS, O2sat=97% on 4L/min [place];  Wt=227#;  HEENT- neg, mallampati3;  Chest- decr BS bilat, otherw clear w/o w/r/r;  Heart- RR w/o m/r/g;  Abd- obese, soft;  Ext- neg w/o c/c/e. IMP/PLAN>>  ErneBrycen severe end-stage COPD- mixed chr bronchitis & emphysema w/ chr hypoxemia & hypercarbic resp failure, w/ Hx pulmHTN & right heart failure;  Now improved and approaching his baseline on max meds=> O2 at 2-4L/min 24/7 (he has [employer] that supplies 2L/min continuous or up to 6L/min pulse dose flow), nocturnal BiPAP, NEBs w/ Duoneb Qid & Budes 0.25Bid, Mucinex 1200mg55m weaning oral Prednisone from his recent hosp, finished oral Levaquin; on ASA81, Lasix40Bid, Avapro75, Imdur30, K20, Zocor20, and off prev Metoprolol... We discussed continuing the Pred taper til gone, continue other meds the same for now & ROV  recheck 268mo..17mote: he has been followed for Cards by [name] (HBP, CAD w/ IWMI 1995 & mult PCIs, HL) and last seen in 201`5 but he saw Cyerra Yim Richardson Dopp 5-04/2016...  ADDENDUM>> CT Chest 02/17/17 (60mo fo68mo up CT Chest- compare to 08/19/16)>> mild cadiomeg w/ atherosclerotic calcif in Ao/ great vessels/ coronaries; no enlarged adenopathy; extensive bullous emphysema, areas of mild interstitial prominence, stable nodular opacities (largest 1.3 x 0.9 cm in inferior LLL abutting the pleura) & no new parenchymal lung opacity; upper abd shows atherosclerotic calcif of Ao, right renal cyst, & an incompletely vis mass in mid abd=> needs CT Abd... ADDENDUM>> CT Abd & Pelvis 02/18/17 showed a large 25 x 16 x 18 cm mass in right abd mesentary, no adenopathy, additional findings include ao & coronary calcif, mod centrilob emphysema, diffuse hepatic steatosis, cholelithiasis & mod left colonic diverticulosis...   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);  [facility]ults/ eval>>     [facility] DrLevineDubuque Dx w/ soft tissue sarcoma right abd, felt to be high risk for surg due to COPD & cardiac issues=> rec referral to Pulm,  [place]    XRT by DrBlackstock- [facility] 03/18/17>  They planned 45 Gy in 25 fractions- completed by 05/06/17, taking imodium for loose stools    Pulmonary consult from DrPascual 04/28/17>  He felt that abd surg would pose a high risk of pulm complications given his age, chronic hypercarbic & hypoxemic resp failure, poor exercise capacity, & pulm HTN; in addition they felt he was on a good triple therapy regimen & did not pose any changes...    Pt notes that his abd has shrunk & his breathing  is sl better; still using 4-6L/min O2 & checks his pulse ox readings; he notes the O2 also helps his leg cramps "the nurse says it's oxygen deprivation" (using [facility]); he remains on BiPAP Qhs & rests well, wakes refreshed, denies daytime sleepiness;  He has been regular w/ his NEB rx using Duoneb Qid & Budesonide Bid + AlbutHFA rescue inhaler as needed...     EXAM shows Afeb, VSS, O2sat=99% on 4L/min ;  Wt=227#;  HEENT- neg, mallampati3;  Chest- decr BS bilat, otherw clear w/o w/r/r;  Heart- RR w/o m/r/g;  Abd- protuberant abd, soft & nontender, norm BS;  Ext- VI, +1edema.  Last CXR was 01/22/17 showing cardiomegaly, aortic atherosclerosis, bilat emphysema & interstitial prominence in the bases, no consolidation or effusion..  Last CT as above - [facility] plans repeat abd CT in August (after the XRT treatment)...  PFTs in Care Everywhere>  [facility] 04/28/17 showed FVC=2.60 (70%), FEV1=0.97 (36%), %1sec=37%...  Labs in Care everywhere 04/2017>  Chems- ok x BS=125, Cr=0.64, LFTs=wnl... 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  ~  October 10, 2017:  73moROV & EMyronindicates to me that he was told the abd tumor mass had not shrunk enough to consider surgery, and the Oncology team is holding off on ChemoRx until it's necessary for tumor growing, recently it has been about the same... From the pulmonary standpoint he feels that he is breathing OK- notes tired & sl SOB in the afternoon, decr cough & sl beige sput, no hemoptysis; he is NOT very active & getting [facility] for PT;  Using O2 at 4L/min & O2sat=94%... We reviewed the following NOTES is Epic Care  Everywhere>    [facility] of intra-abd site- he has completed 25 fractions of XRT on 05/06/17, notes some improvement in breathing & has decr O2 to 5L/min; notes some decr abd & leg swelling;  CT Abd&Pelvis [facility]/24/17 at [facility]>  IMPRESSION: 1)Stable large heterogeneously enhancing mass in the right abdomen compatible with spindle cell neoplasm. 2)86mhyperenhancing lesion in the left hepatic lobe is too small to characterize but favored to represent a flash filling hemangioma or arterioportal shunt. 3)Stable subcentimeter pulmonary nodules as described above. Severe emphysema. Given smoking history, Fleischner guidelines recommend follow-up chest CT in 18 months. 4)Stable right hilar lymph node measuring 1.3 cm short axis, favor reactive. 5)(Hepatic steatosis. 6)Splenomegaly.  DrLevine felt he was too high risk for surg based on his pulm & cardiac issues; REC for watchful waiting & f/u w/ med oncology  for poss chemoRx when approp...    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 2) Progressively enlarging left adrenal nodule, currently measuring 9 mm in diameter. Given the avid enhancement and enlargement, this would be concerning for a potential site of metastasis. 3) Extensive pulmonary emphysema. Small pulmonary nodules are indeterminate, but show no interval change from the oldest available comparison CT examinations. Continued attention on follow-up. 4) Additional unchanged findings as noted above;  They rec f/u CT scans in 4 months & close observation... PT was ordered for the pt...    10/03/17 GENETIC Testing results>  Mr. Eamonn Sermeno was  contacted by telephone today to discuss his negative genetic test results. No pathogenic mutations were detected on a 43-gene panel for evaluation of hereditary cancer. Genetic testing was pursued based on patient's extensive personal and family history of cancer.   We reviewed the following medical problems during today's office visit>     COPD, mixed type w/ emphysema>  He has GOLD Stage3 COPD & on O2 as noted, NEBS w/ DUONEB Qid & BUDESONIDE0.25Bid, plus ProairHFA prn when out & about; off the Pred Rx given last during his 09/2016 [facility] & tapered off...    Ex-smoker>  He quit late in 2016...    Chronic resp failure w/ hypoxemia and hypercarbia>  On above meds + Lasix40Bid, K20; also on O2 at 4L/min regularly & BiPAP Qhs=> need to derview download data...    Cor pulmonale, secondary pulm hypertension>  He is on O2 24/7 using 4L/min continuous at night & 4L/min pulse dose during the day...    Intra-abdominal SARCOMA>  See above- eval & management from [place], XRT, Surg Oncology...    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    MEDICAL issues>  HBP, HL, DM, obesity, gastritis, divertics, colon polyps, BPH, hx bladder cancer, DJD, anxiety  EXAM shows Afeb, VSS, O2sat=94% on 4L/min [place];  Wt=235#;  HEENT- neg, mallampati3;  Chest- decr BS bilat, otherw clear w/o w/r/r;  Heart- RR w/o m/r/g;  Abd- protuberant abd, soft & nontender, norm BS;  Ext- VI, +1edema.  LABS> pt reports labs from [facility] office recently were all OK... IMP/PLAN>>  I would like to see a BiPAP download, continue current meds the same & f/u scans as [facility]; we will recheck pt in 49mo sooner if needed prn...  ~  January 10, 2018:  349moOV & pulmonary/ medical follow up visit> ErIzacketurns and indicates that he is doing better overall, feels that his breathing is at baseline but he arrives today in wheelchair w/  his O2 at 4L/min via POC- he continues on his NEB Qid, Budes Bid, nightly BiPAP & his oxygen;  We [place] get [place] from [place] Medicare says he didn't qualify & won't pay them?)  He notes appetitie is good, eating well he says;  He continues getting home PT from [facility]... We reviewed the following interval Epic/ Care Everywhere records>      [facility]>  F/u primary intra-abd spindle-cell  sarcoma, s/p XRT w/ 45Gy in 25 fragments betw 5/17 - 05/06/17;  CT Abd&Pelvis 12/27/17 at [facility]>  CONCLUSION:  1) 1.5 cm left adrenal nodule, increased in size and concerning for metastasis.  2) 1.6 cm left hepatic lobe hypodensity measures slightly larger, concerning for malignancy. Consider liver MRI to further assess or attention on follow-up. Liver has a similar lobular contour and cirrhosis is not excluded.  3) 25 x 14 x 17 cm right lower quadrant spindle cell carcinoma is similar in size/appearance.  4) Left lung pleural-based or subpleural nodules are similar to slightly decreased. Prominent right hilar node is similar. Continued attention on follow up is advised.  5) 1.7 cm nodule arising from the thyroid isthmus, similar compared to prior. Consider thyroid ultrasound for further evaluation.  Oncology is considering IR- ablation of the liver & adrenal lesions... We reviewed the following medical problems during today's office visit>     COPD, mixed type w/ emphysema>  He has GOLD Stage3 COPD & on O2-4L/min as noted, NEBS w/ DUONEB Qid & BUDESONIDE0.25Bid, plus ProairHFA prn when out & about; off the Pred Rx given last during his 09/2016 [facility] & tapered off...    Ex-smoker>  He quit late in 2016...    Chronic resp failure w/ hypoxemia and hypercarbia>  On above meds + Lasix40Bid, K20; also on O2 at 4L/min regularly & BiPAP Qhs=> need to review download data...    Cor pulmonale, secondary pulm hypertension>  He is on O2 24/7 using 4L/min continuous at night &  4L/min pulse dose during the day...    Intra-abdominal SARCOMA w/ mets>  See above- eval & management from [place], XRT, Surg Oncology, Interventional Radiology...    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    MEDICAL issues>  HBP, HL, DM, obesity, gastritis, divertics, colon polyps, BPH, hx bladder cancer, DJD, anxiety EXAM shows Afeb, VSS, O2sat=94% on 4L/min [place];  Wt=235#;  HEENT- neg, mallampati3;  Chest- decr BS bilat, otherw clear w/o w/r/r;  Heart- RR w/o m/r/g;  Abd- protuberant abd, soft & nontender, norm BS;  Ext- VI, +1edema. IMP/PLAN>>  He is at baseline from the pulm standpoint- continue current Opxygen, BiPAP Qhs, Duoneb Qid & Budes Bid;  On-going management of his intra-abdominal sarcoma by [place] mutidisciplinary team...   ~  Apr 12, 2018:  60moROV & [name] states that his breathing is fair & stable on a regimen of DUONEB Qid, Budes0.25 via neb Bid, AlbutHFA rescue inhaler as needed;  He remains on O2 at 4L/min, BiPAP but we can't get download (Ace Ginssaid he didn't qualify & Medicare won't pay them);  He denies cough, notes sm amt green sput, no hemoptysis, SOB/DOE is about the same he says (eg- with walking but he tilled garden w/ tractor)...  We reviewed the following interval Epic/ Care Everywhere records>       [facility]  Impression:   1.Continued upward trend in the size of the left hepatic lobe hypodensity with rim enhancement which is likely a metastasis.  2.Similar size of the 1.5 cm left adrenal nodule concerning for metastasis. .[name] 3.Similar size and appearance of the large right lower quadrant spindle cell carcinoma. 4.No interval change in the pulmonary nodules as further described above. 5.Stable size of a prominent right hilar lymph node. 6.Similar size of the 1.7 cm hyperdense  nodule arising the thyroid isthmus.  Consider thyroid ultrasound for further evaluation.     CT OF THE ABDOMEN WITH INTRAVENOUS CONTRAST (CIRRHOSIS PROTOCOL) 03/06/18> Redemonstrated ill-defined hypodense lesion with peripheral enhancement involving the left hepatic lobe. As previously discussed, continued enlargement over multiple studies is concerning for metastatic involvement. No additional suspicious focal liver lesions appreciated.      LABS [facility]/28/18 at [facility]>  CBC- Hg=8.3, mcv=96, WBC=3.9, Fe=58 (19%sat), Ferritin=274;  Chems- K=4.4, BS=120, Cr=0.89, LFTs are wnl DrHolwerta transfussed him 2u packed cells (A+) on 01/18/18 when Hg=7.8.Marland KitchenMarland Kitchen He had FNA of thyroid nodule on 03/01/18 w/ PATH= benign follicular nodule...    He saw DrRaj- [facility] on 02/28/18>  His excellent summary note is reviewed; most recent CT (above) w/ incr size of liver met & new left adrenal met => refer to IR for consideration of Ablation...    He saw Dr[facility]TDowning-[facility] to discuss Liver Ablation on 03/28/18>  Note reviewed, 2.5cm nodule in left hepatic lobe, and 1.4cm left adrenal nodule, hx prev XRT to the abd mass (45 Gy in 25 fractions completed 05/06/17);  They are planning percutaneous microwave ablation of the liver lesion and cryoablation of the left adrenal lesion (done 04/25/18) We reviewed the following medical problems during today's office visit>     COPD, mixed type w/ emphysema>  He has GOLD Stage3 COPD & on O2-4L/min as noted, NEBS w/ DUONEB Qid & BUDESONIDE0.25Bid, plus ProairHFA prn when out & about; off the Pred Rx given last during his 09/2016 [facility] & tapered off...    Ex-smoker>  He quit late in 2016...    Chronic resp failure w/ hypoxemia and hypercarbia>  On above meds + Lasix40Bid, K20; also on O2 at 4L/min regularly & BiPAP Qhs=> need to review download data...    Cor pulmonale, secondary pulm hypertension>  He is on O2 24/7 using 4L/min continuous at night & 4L/min pulse dose during the day...    Intra-abdominal SARCOMA w/ mets>  See above- eval & management  from [place], XRT, Surg Oncology, Interventional Radiology...    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    MEDICAL issues>  HBP, HL, DM, obesity, gastritis, divertics, colon polyps, BPH, hx bladder cancer, DJD, anxiety... His PCP is DrHolwerta. EXAM shows Afeb, VSS, O2sat=99% on 4L/min [place];  Wt=227#;  HEENT- neg, mallampati3;  Chest- decr BS bilat, otherw clear w/o w/r/r;  Heart- RR w/o m/r/g;  Abd- protuberant abd, soft & nontender, norm BS;  Ext- VI, +1edema. IMP/PLAN>>  [name] has on-going issues w/ the intra-abd sarcoma (s/p XRT at [facility]) & apparent mets to liver & adrenal (planned ablations by IR at [facility]); he was anemic & required Tx raising the concern for bleeding- [facility] are aware;  Continue same pulmonary Rx...  ~  July 13, 2018:  34moROV & [name] 1st was [facility] - 05/18/18 by [facility] w/ GIB- bloody stools and anemia; then re-admitted 7/24-28/2019 by [facility] w/ severe GIBleed & Tx to [facility] from [facility]- 06/17/18 (see below); today he is c/o sinus infection w/ drainage, cough, yellow/green mucus, & incr SOB=> we decided to treat w/ Levaquin & Pred taper...    1st ADM [facility] 6/29 - 05/18/18 by [facility]>  Presented w/ bloody stools & anemia, known hx intra-abd sarcoma, COPD on BiPAP Qhs & home O2, CAD- s/p stents & DiastolicCHF;  he was transfused, felt to be too chr ill for colonoscopy & bleeding appeared to stop spont- no interventions necessary (divertic bleed was suspected); ASA was stopped & NSAIDs avoided, started on Protonix; hemodynamically stable & euvolemic...  CT Abd & pelvis 05/13/18>  desc & sigm diverticulosis w/o diverticulitis; stable mass within the mesentary of right abd ~20 x 18 x 16 cm; coronary atherosclerosis + mod centrilob & paraseptal emphysema at lung bases; +hepatic steatosis + stable 4.7 x 3.5 x 3.6 cm hypodense lesion  in left lobe; +small gallstones near the neck; fatty replacement of pancreas;  Left adrenal nodule, cyst right kidney, atherosclerotic abd aorta...  CTA Abd&Pelvis 05/14/18>  No specific explanation for GIB, no active hemorrhage, extensive distal colon diverticulosis; known spindle cell carcinoma in RLQ, left hepatic & left adrenal nodules, cholelithiasis  LABS 6=>7/19:  CBC- Hg=7.1 => 9.2;  He was transfused;  Chems- ok...    2nd ADM at [facility] 7/24 - 06/11/18 by [facility]>  Recurrent GIB initially dark=> BRB and anemic w/ Hg=6.7 but hemodynamically stable; CT Abd w/o acute changes, CTA w/o bleeding site ident; he was transfused & GI team suggested [facility]    Transferred to [place] 7/28 - 06/17/18>  140 page DC summary in [facility] has been reviewed by me-- the bleeding was felt to be due to the necrotic mesenteric mass but they didn't place coils because it stopped bleeding spontaneously... Pt ventilated about a bad experience w/ a resident physician at [facility]... We reviewed the following medical problems during today's office visit>     COPD, mixed type w/ emphysema>  He has GOLD Stage3 COPD & on O2-4L/min as noted, NEBS w/ DUONEB Qid & BUDESONIDE0.25Bid, plus ProairHFA prn when out & about; off the Pred Rx given last during his 09/2016 [facility].    Ex-smoker>  He quit late in 2016...    Chronic resp failure w/ hypoxemia and hypercarbia>  On above meds + Lasix40Bid, K20; also on O2 at 4L/min regularly & BiPAP Qhs=> need to review download data...    Cor pulmonale, secondary pulm hypertension>  He is on O2 24/7 using 4L/min continuous at night & 4L/min pulse dose during the day...    Intra-abdominal SARCOMA w/ mets>  See above- eval & management from [place], XRT, Surg Oncology, Interventional Radiology; he is s/p ablation therapy 6/[facility] for [facility] liver & adrenal met...    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    MEDICAL issues>  HBP, HL, DM, obesity, gastritis, divertics, colon polyps, BPH, hx bladder cancer, DJD, anxiety... His PCP is DrHolwerta. EXAM shows Afeb, VSS, O2sat=94% on 4L/min [place];  Wt=224#;  HEENT- neg, mallampati3;  Chest- decr BS bilat, otherw clear w/o w/r/r;  Heart- RR w/o m/r/g;  Abd- protuberant abd, soft & nontender, norm BS, lsrge mass is papl;  Ext- VI, +1edema. IMP/PLAN>> [name] has been thru it- known large intra-abd sarcoma, mets to liver, adrenal,?lung and s/p IR ablation therapy to liver & adrenal met; then active GI bleed July=>Aug as above; now c/o sinus infection & COPD exac for which we will treat w/ Levaquin & Pred course... we plan rov recheck in 32mo..   ~  September 13, 2018:  262moOV & [name] notes that he was feeling exhausted, tired, weak & DrHolwerta checked pt=> anemic & they gave him 2u packed cells yest=>  reports feeling better;  He is followed by Palliative Care here in [facility] & their notes are reviewed;  He is sched for f/u CT & LABS at [facility] in November... Pt is still talking about his bad experience w/ one partic resident physician seen during his [facility] Hosp [facility]...    He has COPD- mixed chr bronchitic & emphysema with hypoxemic & hypercarbic resp failure> he remains on O2 at 4L/min (incr to 6L/min when ambulatory; NEBS w/ DuonebQid and, Budesonide Bid, Mucinex 1245m Bid... he completed his Levaquin & Pred taper prescribed last visit & responded nicely to Rx...    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    MEDICAL issues>  HBP, HL, DM, obesity, gastritis, divertics, colon polyps, GIB, BPH, hx  bladder cancer, DJD, anxiety, and anemia from the GIB..Marland KitchenMarland KitchenHis PCP is DrHolwerta. EXAM shows Afeb, VSS, O2sat=98% on 4L/min [place];  Wt=230#;  HEENT- neg, mallampati3;  Chest- decr BS bilat, otherw clear w/o w/r/r;  Heart- RR w/o m/r/g;  Abd- protuberant abd, soft & nontender, norm BS, large mass is papl;  Ext- VI, +1edema. IMP/PLAN>>  [name] is stable & [facility] by [facility];  [facility] recently found to be anemic & give 2u packed cells (we do not have notes or labs to review;  We discussed my up-coming retirement & will set him up to see DrIcard in 2-339mo.          Problem List:  Hx of SINUSITIS (ICD-473.9) - off Nasonex now & recent Rx w/ Augmentin/ Saline... notes occas sinus congestion, drainage, sl HA, etc... he knows that he needs to quit smoking! ~  10/10: Adm for epistaxis w/ eval by DrWolicki- CXR= NAD, labs OK, rec for saline lavage- nose bleed resolved & disch home (no recurrence so far). ~  1/16: he presented w/ acute max & frontal sinusitis> Rx w/ Augmentin, Align, Saline, Mucinex, etc... ~  3/17: he has acute bronchitic exac- treated w/ Levaquin x7d + his regular meds...  COPD (ICD-496) >>   ~  on ADVAIR 250Bid; STILL SMOKES CIGARETTES & he's refused Chantix...  ~  He had hemoptysis 6/99 without lesion on CXR, CTChest, or Bronchoscopy... ~  Adm 10/10 w/ epistaxsis & eval DrWolicki as noted> he wanted to do Sleep Study but pt cancelled it & declined to resched. ~  CXR 5/11 showed COPD/ bullous emphysema, NAD...[name]~  CXR 5/12 showed ectatic calcif & tort Aorta, COPD w/ incr markings ar bases, NAD, +degen spondylosis. ~  CXR 6/13 showed COPD/ emphysema, scarring in the lower lobes, otherw clear lungs, DJD in spine... ~  CXR 7/14 showed COPD/ emphysema, incr markings and scarring at the bases, NAD. ~  1/15: ErJaquelyn Bitterays he's decr his smoking from prev 3-4ppd down to 3 cig/d;  He remains on Advair250> states breathing is ok, at  baseline CAT score=12; baseline cough, sput, no hemoptysis, stable DOE, no edema... ~  CXR 7/15 showed underlying COPD, mild bibasilar fibrosis, norm heart size, DJD in spine, NAD...  ~  PFT 7/15 showed> FVC=3.18 (78%),  FEV1=1.22 (38%), %1sec=38; mid-flows=13% predicted... This is c/w GOLD Stage3 COPD and we discussed this> He is rec to quit all smoking immediately; he has been using the nicotine gum & rec to try patches, offered Wellbutrin, he refuses Chantix; needs to continue ADVAIR250Bid & samples givem; he declines to start [place] at this time due to cost & donut hole (we will address this again on return); reminded to use MUCINEX 657m- 1to2 Bid w/ fluids, and OK to use OTC DELSYM as needed for cough...   ~  1/16: he claims stable on Advair250 but still smoking & can't vs won't quit; we decided to add Spiriva daily via handihaler... 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 ~  12/16:  [facility], COPD exac, and found to have pulmHTN w/ 2DEcho showing PAsys=51;  Treated w/ O2, Pred taper, antibiotics, NEBS, Advair/Spiriva, etc;  He says he has quit smoking... ~  [facility]- COPD, bullous dis, incr markings at bases, NAD; Cultures of blood/ sput/ urine were all neg; Serologies for strep & legionella were neg; Viral panel was neg; He was treated w/ Oxygen, IV Solumedrol, Rocephin/ Zithromax, NEBS, Lovenox, flutter, etc; He was disch on Home O2- 2L/min activ & 1L/min rest, Pred taper, NEBS, Levaquin, Advair250/ Spiriva => improved. ~  3/17:  severe obstructive lung dis, GOLD Stage 3, chr hypoxemic resp failure w/ pulmHTN (WHO group3); he quit smoking 10/2015 & is using home O2 3L/min, NEBS w/ Albut  tid, Advair250Bid, Spiriva daily, Mucinex etc; he has acute bronchitis exac- Rx w/ Levaquin x7d, continue Home O2 24/7 and push to start Pulm Rehab...   HE HAS ESTABLISHED PRIMARY CARE w/ [name]/ [name] et al >>   HYPERTENSION (ICD-401.9) - controlled on METOPROLOL 250mid,  NORVASC 108m, LISINOPRIL 34m90m..   ~  11/11:  BP=122/70, not checking BP's at home... denies HA, fatigue, visual changes, CP, palipit, dizziness, syncope, dyspnea, edema, etc. ~  5/12:  BP= 120/72, pt very pleased w/ his wt loss & improved exercise ability. ~  6/13:  BP= 122/64 & feeling well w/o CP, palpit, SOB, edema, etc... ~  12/13:  BP=122/82 & he denies CP, palpit, SOB, edema... ~  7/14: on Metop25-3/d, Amlod10, Lisin40; BP=130/78 & he denies CP, palpit, SOB, edema ~  1/15: BP remains stable on Metop25-2AM & 1PM, Amlod5, Lisin40; BP= 120/72 and he denies CP, palpit, ch in SOB, edema. ~  2016> His meds have been adjusted- now on Rx w/ Amlod10, Lisin10; BP= 130/72 7 he denies CP, palpit, edema; chr SOB/DOE due to his COPD.  CORONARY ARTERY DISEASE (ICD-414.00) - ASA 81mg25m off Plavix as of 2/13 officially as he wasn't taking it regularly anyway. ~  Hx prev IWMI and PTCA/ stent in RCA by DrBrodie 12/95 & 5/96...  ~  Myoview 4/08 abnormal w/ inferior ischemia and subseq cath revealing restenoses in RCA- s/p repeat PTCA/ stents...  ~  f/u Myoview 9/08 w/ prev infer infarct, no ischemia, EF=56%... ~  9/11:  pre op cardiac eval DrBrodie> OK to hold ASA/ Plavix for bladder surg. ~  2/12: f/u [name] w/ hx CAD, MI in 95, mult PCIs last  2009> stable, on ASA/ Plavix, no changes made; EKG= NSR, WNL... ~  2/13:  He had f/u [name] w/ Myoview showing a fixed defect in infer wall, no ischemia, EF=58%; pt wasn't taking Plavix, therefore stopped in favor of ASA45m/d... ~  3/14:  He had f/u [name]> CAD, IWMI 1995, mult PCIs, last Myoview 2013 was low risk; too sedentary, no changes made; 71 y/o brother died recently w/ AAA rupture... ~  EKG 3/14 showed NSR, rate65, WNL, NAD... ~  He maintains f/u w/ CARDS, [name]- known CAD, Hx inferior MI 1995, s/p mult PCIs- most recent 2009; finally qit smoking 12/16 [facility]/ [facility]; on ASA, statin, Amlod & Lisin.  HYPERCHOLESTEROLEMIA (Mixed Hyperlipidemia) - prev on Simva80 but DrBrodie decr him to SIMVASTATIN 253md + Fish Oil & Red Wine qd... ~  FLGarfield/08 on Simva40 showed TChol 151, TG 106, HDL 23, LDL 107 ~  FLP 8/09 on Simva80 showed TChol 142, TG 135, HDL 28, LDL 87 ~  FLP 6/10 on Simva80 showed TChol 151, TG 117, HDL 31, LDL 97 ~  FLP in hosp 10/10 showed TChol 128, TG 110, HDL 26, LDL 80 ~  FLP 5/11 on Simva80 showed TChol 144, TG 215, HDL 26, LDL 89 ~  9/11:  DrBrodie decr him to Simva20... we discussed the need for better diet & wt reduction. ~  FLP 5/12 on Simva20 showed TChol 162, TG 159, HDL 32, LDL 98 ~  FLP 6/13 on Simva20 showed TChol 130, TG 102, HDL 33, LDL 77... rec to incr exercise program & get wt down! ~  FLP 7/14 on Simva20 showed TChol 158, TG 145, HDL 33, LDL 96... He has gained way too much wt 7 must get on diet! ~  Followed by PCP- [name]...  DIABETES MELLITUS, BORDERLINE (ICD-790.29) - on diet alone... ~  labs 9/08 showed BS=100, HgA1c=6.1 ~  labs 8/09 showed BS= 107, A1c= 6.2 ~  labs 6/10 showed BS= 108, A1c= 6.2 ~  lab 5/11 (wt=244#) showed BS= 121. A1c= 6.7...Marland Kitchenhe was told "get wt down, or start meds". ~  Labs 5/12 showed BS= 109, A1c= 6.1 ~  Labs 6/13 showed BS= 114, & he needs to lose the weight... ~  Labs 7/14 on diet alone showed BS= 120, A1c= 6.7 ~  Followed by PCP- [name]...  OBESITY (ICD-278.00) - weight was down as low as 202# in 2/09, and steadily incr to 244# by 5/11... ~  weight 5/11 = 244#...[name] we reviewed diet + exercise necessary to lose weight... ~  weight 11/11 = 217# (after dx & rx for bladder cancer) ~  Weight 5/12 = 214# but says it's 192# at home... ~  Weight  6/13 = 211# but knows it's better since his pant/ belt is down to 36" (under his roll). ~  Weight 7/14 = 230# but he has quit smoking he says, now must lose the weight... ~  Weight 1/15 = 229# ~  Weight 1/17 = 216# ~  Weight 3/17 = 231# and we reviewed diet, exercise, wt reducing strategies...  GASTRITIS (ICD-535.50) - had EGD 10/05 showing gastritis... on RANITADINE 30045m due to his Plavix Rx, off prev Omep.  COLONIC POLYPS (ICD-211.3) - last colonoscopy by DrStark 3/04 showed 7mm72mlyp = hyperplastic w/ f/u planned 90yr65yrLAMarland KitchenGE SPINDLE-CELL SARCOMA OF THE ABDOMEN>  [place] [facility] [place] [facility] to the tumor mass 04/2017 ~  S/p ablation therapy by IR 04/2018> liver  met & adrenal met were treated...   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 ~  4/12:  Pt reported f/u cysto was neg, doing well... ~  10/12:  Note from [name] reviewed> cysto was neg, no recurrent tumors... ~  4/13:  Note from [name] reviewed> neg f/u cysto w/o recurrent TCCa... He wants to continue Q19mocystoscopies... ~  1/14:  He had f/u w/ [name]> Hx TCCa bladder, s/p TURBT 9/11, given BCG 11/11-12/11 and f/u Cysto 1/12 was neg; repeat cysto 1/14 was neg as well...  DEGENERATIVE JOINT DISEASE (ICD-715.90) - He has c/o right shoulder pain, hip pain, knee pain, and LBP> Rx ADVIL w/ some benefit... offered Ortho referral & he will decide.  ANXIETY (ICD-300.00) - on ALPRAZOLAM 0.552mPrn...   Past Surgical History:  Procedure Laterality Date  . cataract surgery  septemeber 2013  . cataract surgery/sub posterior vitrectomy in left eye  1996   Dr. RaZadie Rhine. PTCA  [phone]  Outpatient Encounter Medications as of 09/13/2018  Medication Sig    . acetaminophen (TYLENOL) 325 MG tablet Take 2 tablets (650 mg total) by mouth every 6 (six) hours as needed for mild pain (or Fever >/= 101).  . Albuterol Sulfate (PROAIR RESPICLICK) 1081190 Base) MCG/ACT AEPB Inhale 2 puffs into the lungs every 6 (six) hours as needed (shortness of breath).  . ALPRAZolam (XANAX) 0.5 MG tablet Take 0.5 mg by mouth at bedtime.   . budesonide (PULMICORT) 0.25 MG/2ML nebulizer solution Take 2 mLs (0.25 mg total) by nebulization 2 (two) times daily.  . cholecalciferol (VITAMIN D) 400 units TABS tablet Take 400 Units by mouth at bedtime.  . Ferrous Sulfate (IRON) 325 (65 Fe) MG TABS Take 325 mg by mouth daily.  . furosemide (LASIX) 40 MG tablet Take 40 mg by mouth daily.   . Marland KitchenuaiFENesin (MUCINEX) 600 MG 12 hr tablet Take 2 tablets (1,200 mg total) by mouth 2 (two) times daily.  . Marland Kitchenpratropium-albuterol (DUONEB) 0.5-2.5 (3) MG/3ML SOLN Take 3 mLs by nebulization 4 (four) times daily.  . isosorbide mononitrate (IMDUR) 30 MG 24 hr tablet Take 30 mg by mouth daily.  . Marland Kitchenisinopril (PRINIVIL,ZESTRIL) 10 MG tablet Take 10 mg by mouth daily.  . methocarbamol (ROBAXIN) 500 MG tablet TAKE 1 TO 2 TABLETS BY MOUTH TWICE A DAY AS NEEDED FOR MUSCLE CRAMPS  . multivitamin-lutein (OCUVITE-LUTEIN) CAPS capsule Take 1 capsule by mouth every evening.   . nitroGLYCERIN (NITROSTAT) 0.4 MG SL tablet Place 1 tablet (0.4 mg total) under the tongue every 5 (five) minutes as needed for chest pain.  . Vladimir Fasterlycol-Propyl Glycol (SYSTANE OP) Apply 1 drop to eye daily as needed (dry eyes).  . simvastatin (ZOCOR) 20 MG tablet Take 1 tablet (20 mg total) by mouth daily at 6 PM.  . sodium chloride (OCEAN) 0.65 % SOLN nasal spray Place 1 spray into both nostrils as needed for congestion.  . vitamin B-12 (CYANOCOBALAMIN) 1000 MCG tablet Take 1,000 mcg by mouth daily.  . [DISCONTINUED] budesonide (PULMICORT) 0.25 MG/2ML nebulizer solution Take 2 mLs (0.25 mg total) by nebulization 2 (two) times  daily.  . [DISCONTINUED] ipratropium-albuterol (DUONEB) 0.5-2.5 (3) MG/3ML SOLN Take 3 mLs by nebulization 4 (four) times daily.  . [  DISCONTINUED] levofloxacin (LEVAQUIN) 500 MG tablet Take 1 tablet (500 mg total) by mouth daily.  . [DISCONTINUED] ondansetron (ZOFRAN) 4 MG tablet Take 1 tablet (4 mg total) by mouth every 6 (six) hours as needed for nausea.  . [DISCONTINUED] Potassium 99 MG TABS Take 1 tablet by mouth 2 (two) times daily.  . [DISCONTINUED] predniSONE (DELTASONE) 20 MG tablet 1tabtwicedailyx4day,1dailyx4days,1/2tabx4days,1/2tab everyotherday til gone  . pantoprazole (PROTONIX) 40 MG tablet Take 1 tablet (40 mg total) by mouth daily.   No facility-administered encounter medications on file as of 09/13/2018.     Allergies  Allergen Reactions  . Amlodipine Swelling    Immunization History  Administered Date(s) Administered  . Influenza Split 09/13/2011, 09/21/2012, 09/04/2013  . Influenza Whole 08/15/2009, 08/03/2010  . Influenza, High Dose Seasonal PF 09/09/2017, 07/16/2018  . Influenza,inj,Quad PF,6+ Mos 09/04/2014, 07/20/2016  . Influenza-Unspecified 08/31/2015  . Pneumococcal Conjugate-13 08/31/2015  . Pneumococcal Polysaccharide-23 11/15/1998    Current Medications, Allergies, Past Medical History, Past Surgical History, Family History, and Social History were reviewed in [employer] record.    Review of Systems        See HPI - all other systems neg except as noted... The patient complains of some dyspnea on exertion.  The patient denies anorexia, fever, weight loss, weight gain, vision loss, decreased hearing, hoarseness, chest pain, syncope, peripheral edema, prolonged cough, headaches, hemoptysis, abdominal pain, melena, hematochezia, severe indigestion/heartburn, hematuria, incontinence, muscle weakness, suspicious skin lesions, transient blindness, difficulty walking, depression, unusual weight change, abnormal bleeding, enlarged lymph  nodes, and angioedema.     Objective:   Physical Exam    WD, Obese, 72 y/o WM in NAD... Vital Signs:  Reviewed... GENERAL:  Alert & oriented; pleasant & cooperative... HEENT:  [place]/AT, EOM-wnl, PERRLA, EACs-clear, TMs-wnl, NOSE- sl congestion, THROAT-clear & wnl. NECK:  Supple w/ fairROM; no JVD; normal carotid impulses w/o bruits; no thyromegaly or nodules palpated; no lymphadenopathy. CHEST:  decr BS at bases bilat w/ few scat rhonchi, no wheezing/ rales/ or signs of consolidation... HEART:  Regular Rhythm; without murmurs/ rubs/ or gallops detected... ABDOMEN:  Obese, soft, & nontender; sm knot palpated in mid abd above umbilicus; normal bowel sounds; no organomegaly detected. EXT: without deformities, mild arthritic changes; no varicose veins/ +venous insuffic/ no edema. NEURO:  CN's intact; no focal neuro deficits... DERM:  No lesions noted; no rash etc...  RADIOLOGY DATA:  Reviewed in the EPIC EMR & discussed w/ the patient...  LABORATORY DATA:  Reviewed in the EPIC EMR & discussed w/ the patient...   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  11/21/15>  referred for PulmRehab, contact [place] re- POC, he still needs Full PFTs & CT Chest... We plan ROV in 6 wks 02/02/16> We discussed continuing the NEBS Tid followed by Advair250Bid & Spiriva daily; add Mucinex 1238mBid & we will Rx w/ Levaquin500/d x7d for his bronchitic infection (see AVS);  He still needs a CT Chest but wants to wait for now;  [place], and he wants diff DME supplier- we will inquire, plan ROV  39mo6/21/17>    The Advair & Spiriva were too $$ so we switched to NEBS w/ Duoneb Qid & Budes Bid; added ProairHFA prn; he desperately needs to lose wt in conjunction w/ his PulmRehab etc... 08/05/16>   He presents w/ a COPD exac-- Rx w/ Levaquin, Pred20-4d taper + NEBS w/ duoneb Qid & budes Bid... 11/24/16>   EJaquelyn Bitterhas severe COPD/emphysema w/ pulmHTN & CorPulmonale; he refuses to fill inhalers due to costs & he is therefore on O2- 24/7, BiPAP nightly, NEBS w/ Duoneb Qid & Budes Bid, Mucinex 1200Bid, & asked to get into a regular exercise program;  He will continue regular f/u w/ CARDS- [name]; must work on weight reduction... 01/27/17>   EMontyhas severe end-stage COPD- mixed chr bronchitis & emphysema w/ chr hypoxemia & hypercarbic resp failure, w/ Hx pulmHTN & right heart failure; s/p Hosp & now improved and approaching his baseline on max meds=> O2 at 2-4L/min 24/7 (he has [employer] that supplies 2L/min continuous or up to 6L/min pulse dose flow), nocturnal BiPAP, NEBs w/ Duoneb Qid & Budes 0.25Bid, Mucinex 12024mid, weaning oral Prednisone from his recent hosp, finished oral Levaquin; on ASA81, Lasix40Bid, Avapro75, Imdur30, K20, Zocor20, and off prev Metoprolol... We discussed continuing the Pred taper til gone, continue other meds the same for now & ROV recheck 60m260mo Note: he has been followed for Cards by [name] (HBP, CAD w/ IWMI 1995 & mult PCIs, HL) and last seen in 201`5 but he saw ScoRichardson Dopp in 5-04/2016... 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 10/10/17>   I would like to see a BiPAP download, continue current meds the same & [facility]  will recheck pt in 67mo55mooner if needed prn... 01/10/18>   He is at baseline from the pulm standpoint- continue current Opxygen, BiPAP Qhs, Duoneb Qid & Budes Bid;  On-going management of his intra-abdominal sarcoma [facility] 04/12/18>   [name] has on-going issues w/ the intra-abd sarcoma ([facility] mets to liver & adrenal (planned ablations [facility] & required Tx raising the concern for bleeding- DrHoWardenology are aware;  Continue same pulmonary Rx. 07/13/18>   [name] has been thru it- known large intra-abd sarcoma, mets to liver, adrenal,?lung and s/p IR ablation therapy to liver & adrenal met; then active GI bleed July=>Aug as above; now c/o sinus infection & COPD exac for which we will treat w/ Levaquin & Pred course... 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 GU>   Followed by [name]   Patient's Medications  New Prescriptions   No medications on file  Previous Medications   ACETAMINOPHEN (TYLENOL) 325 MG TABLET    Take 2 tablets (650 mg total) by mouth every 6 (six) hours as needed for mild pain (or Fever >/= 101).   ALBUTEROL SULFATE (PROAIR RESPICLICK) 151 (90 BASE) MCG/ACT AEPB    Inhale 2 puffs into the lungs every 6 (six) hours as needed (shortness of breath).   ALPRAZOLAM (XANAX) 0.5 MG TABLET    Take 0.5 mg by mouth at bedtime.    CHOLECALCIFEROL (VITAMIN D) 400 UNITS TABS TABLET    Take 400 Units by mouth at bedtime.   FERROUS SULFATE  (IRON) 325 (65 FE) MG TABS    Take 325 mg by mouth daily.   FUROSEMIDE (LASIX) 40 MG TABLET    Take 40 mg by mouth daily.    GUAIFENESIN (MUCINEX) 600 MG 12 HR TABLET    Take 2 tablets (1,200 mg total) by mouth 2 (two) times daily.   ISOSORBIDE MONONITRATE (IMDUR) 30 MG 24 HR TABLET    Take 30 mg by mouth daily.   LISINOPRIL (PRINIVIL,ZESTRIL) 10 MG TABLET    Take 10 mg by mouth daily.   METHOCARBAMOL (ROBAXIN) 500 MG TABLET    TAKE 1 TO 2 TABLETS BY MOUTH TWICE A DAY AS NEEDED FOR MUSCLE CRAMPS   MULTIVITAMIN-LUTEIN (OCUVITE-LUTEIN) CAPS CAPSULE    Take 1 capsule by mouth every evening.    NITROGLYCERIN (NITROSTAT) 0.4 MG SL TABLET    Place 1 tablet (0.4 mg total) under the tongue every 5 (five) minutes as needed for chest pain.   PANTOPRAZOLE (PROTONIX) 40 MG TABLET    Take 1 tablet (40 mg total) by mouth daily.   POLYETHYL GLYCOL-PROPYL GLYCOL (SYSTANE OP)    Apply 1 drop to eye daily as needed (dry eyes).   SIMVASTATIN (ZOCOR) 20 MG TABLET    Take 1 tablet (20 mg total) by mouth daily at 6 PM.   SODIUM CHLORIDE (OCEAN) 0.65 % SOLN NASAL SPRAY    Place 1 spray into both nostrils as needed for congestion.   VITAMIN B-12 (CYANOCOBALAMIN) 1000 MCG TABLET    Take 1,000 mcg by mouth daily.  Modified Medications   Modified Medication Previous Medication   BUDESONIDE (PULMICORT) 0.25 MG/2ML NEBULIZER SOLUTION budesonide (PULMICORT) 0.25 MG/2ML nebulizer solution      Take 2 mLs (0.25 mg total) by nebulization 2 (two) times daily.    Take 2 mLs (0.25 mg total) by nebulization 2 (two) times daily.   IPRATROPIUM-ALBUTEROL (DUONEB) 0.5-2.5 (3) MG/3ML SOLN ipratropium-albuterol (DUONEB) 0.5-2.5 (3) MG/3ML SOLN      Take 3 mLs by nebulization 4 (four) times daily.    Take 3 mLs by nebulization 4 (four) times daily.  Discontinued Medications   LEVOFLOXACIN (LEVAQUIN) 500 MG TABLET    Take 1 tablet (500 mg total) by mouth daily.   ONDANSETRON (ZOFRAN) 4 MG TABLET    Take 1 tablet (4 mg total) by mouth every  6 (six) hours as needed for nausea.   POTASSIUM 99 MG TABS    Take 1 tablet by mouth 2 (two) times daily.   PREDNISONE (DELTASONE) 20 MG TABLET    1tabtwicedailyx4day,1dailyx4days,1/2tabx4days,1/2tab everyotherday til gone  NOTE>> Advair & Spiriva discontinued due to cost & replaced by DUONEB-Qid & PULMICORT 0,28mBid via NEBULIZER..Marland KitchenMarland Kitchen

## 2018-10-26 NOTE — Patient Instructions (Signed)
Today we updated your med list in our EPIC system...    Continue your current medications the same...  We refilled the meds you requested...  We will arrange for a pulmonary follow up w/ DrIcard in January- some time after your ablation at [facility].Marland KitchenMarland Kitchen

## 2018-10-26 NOTE — Progress Notes (Signed)
COMMUNITY PALLIATIVE CARE RN NOTE  PATIENT NAME: [name] DOB: 03/17/1946 MRN: [id number]  PRIMARY CARE PROVIDER: Holwerda, Scott, MD  RESPONSIBLE PARTY:  Acct ID - Guarantor Home Phone Work Phone Relationship Acct Type  [id number] - [name],[name]* [phone]  Self P/F     [address], [place], [place] [id number]    PLAN OF CARE and INTERVENTION:  1. ADVANCE CARE PLANNING/GOALS OF CARE: Remain in his home and avoid going to the hospital 2. PATIENT/CAREGIVER EDUCATION: [employer] and Safe Mobility 3. DISEASE STATUS: Joint visit made with Palliative Care SW, [name]. Met with patient in his home. He was recently discharged from the hospital s/p Pneumonia. He was treated with IV antibiotics and sterioids. He explained that prior to his hospital admission, he had an appointment with [name] at [facility] to discuss treatments for his liver lesions . After returning to his car following the appointment, he had a coughing episode, causing him to expectorate excessive amount of phlegm. He began experiencing severe dyspnea and could not recover. His wife took him to the [facility]. His sats had dropped to the 70s, and did not raise to normal limits until after being placed on bi-pap. He wanted to return home vs going to a facility for Rehab. He reports that he is feeling better. Dyspnea noted with exertion, ambulating less than 20 ft and during conversation. He states that he is trying to keep his Oxygen at 4.5-5 L via [place]. He remains able to ambulate, dress, bathe and feed himself without assistance, but tasks are taking longer due to dyspnea. He is confident that he can build himself back up slowly while at home. His intake is slightly decreased, but he is eating 3 meals/day. He has a script for a lightweight wheelchair. This RN to check with [facility] and other DME companies to have ordered. He states that he has 2 lesions on his liver and is to start  microwave treatments to this area after the first of the year. He denies any pain or discomfort. No edema noted. Will continue to monitor.  HISTORY OF PRESENT ILLNESS:  This is a 72 yo male who resides at home with his wife. He was recently hospitalized from 10/17/18 to 10/23/18 d/t a COPD exacerbation. Palliative Care Team continues to follow patient at home. Will continue to visit monthly and PRN.  CODE STATUS: FULL CODE  ADVANCED DIRECTIVES: N MOST FORM: no PPS: 50%   PHYSICAL EXAM:   VITALS: Today's Vitals   10/25/18 1037  BP: 110/60  Pulse: 80  Resp: 20  Temp: (!) 97.4 F (36.3 C)  TempSrc: Temporal  SpO2: 98%  PainSc: 0-No pain    LUNGS: decreased breath sounds CARDIAC: Cor RRR EXTREMITIES: No edema SKIN: Skin color, texture, turgor normal. No rashes or lesions  NEURO: Alert and oriented x 3, pleasant mood, generalized weakness, ambulatory without assistive devices   (Duration of visit and documentation 75 minutes)    Andrew Howard, RN, BSN 

## 2018-11-01 ENCOUNTER — Other Ambulatory Visit (HOSPITAL_COMMUNITY): Payer: Self-pay | Admitting: *Deleted

## 2018-11-02 ENCOUNTER — Ambulatory Visit (HOSPITAL_COMMUNITY)
Admission: RE | Admit: 2018-11-02 | Discharge: 2018-11-02 | Disposition: A | Discharge status: Home / Self Care | Payer: Medicare Other | Source: Ambulatory Visit | Attending: Internal Medicine | Admitting: Internal Medicine

## 2018-11-02 DIAGNOSIS — D649 Anemia, unspecified: Secondary | ICD-10-CM | POA: Insufficient documentation

## 2018-11-02 MED ORDER — SODIUM CHLORIDE 0.9% IV SOLUTION: Freq: Once | INTRAVENOUS | Status: DC

## 2018-11-03 LAB — BPAM RBC
Blood Product Expiration Date: 201912232359
ISSUE DATE / TIME: 201912190957
UNIT TYPE AND RH: 6200

## 2018-11-03 LAB — TYPE AND SCREEN
ABO/RH(D): A POS
ANTIBODY SCREEN: NEGATIVE
Unit division: 0

## 2018-11-07 DIAGNOSIS — D492 Neoplasm of unspecified behavior of bone, soft tissue, and skin: Secondary | ICD-10-CM | POA: Diagnosis not present

## 2018-11-22 DIAGNOSIS — Z87891 Personal history of nicotine dependence: Secondary | ICD-10-CM | POA: Diagnosis not present

## 2018-11-22 DIAGNOSIS — I110 Hypertensive heart disease with heart failure: Secondary | ICD-10-CM | POA: Diagnosis not present

## 2018-11-22 DIAGNOSIS — E119 Type 2 diabetes mellitus without complications: Secondary | ICD-10-CM | POA: Diagnosis not present

## 2018-11-22 DIAGNOSIS — C7972 Secondary malignant neoplasm of left adrenal gland: Secondary | ICD-10-CM | POA: Diagnosis not present

## 2018-11-22 DIAGNOSIS — J441 Chronic obstructive pulmonary disease with (acute) exacerbation: Secondary | ICD-10-CM | POA: Diagnosis not present

## 2018-11-22 DIAGNOSIS — C494 Malignant neoplasm of connective and soft tissue of abdomen: Secondary | ICD-10-CM | POA: Diagnosis not present

## 2018-11-22 DIAGNOSIS — I5032 Chronic diastolic (congestive) heart failure: Secondary | ICD-10-CM | POA: Diagnosis not present

## 2018-11-22 DIAGNOSIS — I272 Pulmonary hypertension, unspecified: Secondary | ICD-10-CM | POA: Diagnosis not present

## 2018-11-22 DIAGNOSIS — Z794 Long term (current) use of insulin: Secondary | ICD-10-CM | POA: Diagnosis not present

## 2018-11-22 DIAGNOSIS — C787 Secondary malignant neoplasm of liver and intrahepatic bile duct: Secondary | ICD-10-CM | POA: Diagnosis not present

## 2018-11-22 DIAGNOSIS — C7801 Secondary malignant neoplasm of right lung: Secondary | ICD-10-CM | POA: Diagnosis not present

## 2018-11-23 ENCOUNTER — Ambulatory Visit: Payer: Medicare Other | Admitting: Pulmonary Disease

## 2018-11-24 DIAGNOSIS — C494 Malignant neoplasm of connective and soft tissue of abdomen: Secondary | ICD-10-CM | POA: Diagnosis not present

## 2018-12-01 DIAGNOSIS — G4733 Obstructive sleep apnea (adult) (pediatric): Secondary | ICD-10-CM | POA: Diagnosis not present

## 2018-12-01 DIAGNOSIS — C494 Malignant neoplasm of connective and soft tissue of abdomen: Secondary | ICD-10-CM | POA: Diagnosis not present

## 2018-12-01 DIAGNOSIS — C7972 Secondary malignant neoplasm of left adrenal gland: Secondary | ICD-10-CM | POA: Diagnosis not present

## 2018-12-01 DIAGNOSIS — E785 Hyperlipidemia, unspecified: Secondary | ICD-10-CM | POA: Diagnosis not present

## 2018-12-01 DIAGNOSIS — I10 Essential (primary) hypertension: Secondary | ICD-10-CM | POA: Diagnosis not present

## 2018-12-01 DIAGNOSIS — D649 Anemia, unspecified: Secondary | ICD-10-CM | POA: Diagnosis not present

## 2018-12-01 DIAGNOSIS — J9622 Acute and chronic respiratory failure with hypercapnia: Secondary | ICD-10-CM | POA: Diagnosis not present

## 2018-12-01 DIAGNOSIS — C787 Secondary malignant neoplasm of liver and intrahepatic bile duct: Secondary | ICD-10-CM | POA: Diagnosis not present

## 2018-12-01 DIAGNOSIS — E119 Type 2 diabetes mellitus without complications: Secondary | ICD-10-CM | POA: Diagnosis not present

## 2018-12-01 DIAGNOSIS — I2510 Atherosclerotic heart disease of native coronary artery without angina pectoris: Secondary | ICD-10-CM | POA: Diagnosis not present

## 2018-12-01 DIAGNOSIS — Z9981 Dependence on supplemental oxygen: Secondary | ICD-10-CM | POA: Diagnosis not present

## 2018-12-01 DIAGNOSIS — I5032 Chronic diastolic (congestive) heart failure: Secondary | ICD-10-CM | POA: Diagnosis not present

## 2018-12-01 DIAGNOSIS — T80319A ABO incompatibility with hemolytic transfusion reaction, unspecified, initial encounter: Secondary | ICD-10-CM | POA: Diagnosis not present

## 2018-12-01 DIAGNOSIS — Z9989 Dependence on other enabling machines and devices: Secondary | ICD-10-CM | POA: Diagnosis not present

## 2018-12-01 DIAGNOSIS — R932 Abnormal findings on diagnostic imaging of liver and biliary tract: Secondary | ICD-10-CM | POA: Diagnosis not present

## 2018-12-01 DIAGNOSIS — C7801 Secondary malignant neoplasm of right lung: Secondary | ICD-10-CM | POA: Diagnosis not present

## 2018-12-01 DIAGNOSIS — I110 Hypertensive heart disease with heart failure: Secondary | ICD-10-CM | POA: Diagnosis not present

## 2018-12-01 DIAGNOSIS — C499 Malignant neoplasm of connective and soft tissue, unspecified: Secondary | ICD-10-CM | POA: Diagnosis not present

## 2018-12-01 DIAGNOSIS — J449 Chronic obstructive pulmonary disease, unspecified: Secondary | ICD-10-CM | POA: Diagnosis not present

## 2018-12-02 DIAGNOSIS — D649 Anemia, unspecified: Secondary | ICD-10-CM | POA: Diagnosis not present

## 2018-12-02 DIAGNOSIS — J449 Chronic obstructive pulmonary disease, unspecified: Secondary | ICD-10-CM | POA: Diagnosis not present

## 2018-12-02 DIAGNOSIS — G4733 Obstructive sleep apnea (adult) (pediatric): Secondary | ICD-10-CM | POA: Diagnosis not present

## 2018-12-02 DIAGNOSIS — E785 Hyperlipidemia, unspecified: Secondary | ICD-10-CM | POA: Diagnosis not present

## 2018-12-02 DIAGNOSIS — R9431 Abnormal electrocardiogram [ECG] [EKG]: Secondary | ICD-10-CM | POA: Diagnosis not present

## 2018-12-02 DIAGNOSIS — C7972 Secondary malignant neoplasm of left adrenal gland: Secondary | ICD-10-CM | POA: Diagnosis not present

## 2018-12-02 DIAGNOSIS — Z9981 Dependence on supplemental oxygen: Secondary | ICD-10-CM | POA: Diagnosis not present

## 2018-12-02 DIAGNOSIS — I2510 Atherosclerotic heart disease of native coronary artery without angina pectoris: Secondary | ICD-10-CM | POA: Diagnosis not present

## 2018-12-02 DIAGNOSIS — C762 Malignant neoplasm of abdomen: Secondary | ICD-10-CM | POA: Diagnosis not present

## 2018-12-02 DIAGNOSIS — I10 Essential (primary) hypertension: Secondary | ICD-10-CM | POA: Diagnosis not present

## 2018-12-02 DIAGNOSIS — E119 Type 2 diabetes mellitus without complications: Secondary | ICD-10-CM | POA: Diagnosis not present

## 2018-12-02 DIAGNOSIS — Z9989 Dependence on other enabling machines and devices: Secondary | ICD-10-CM | POA: Diagnosis not present

## 2018-12-02 DIAGNOSIS — I110 Hypertensive heart disease with heart failure: Secondary | ICD-10-CM | POA: Diagnosis not present

## 2018-12-02 DIAGNOSIS — I5032 Chronic diastolic (congestive) heart failure: Secondary | ICD-10-CM | POA: Diagnosis not present

## 2018-12-02 DIAGNOSIS — C494 Malignant neoplasm of connective and soft tissue of abdomen: Secondary | ICD-10-CM | POA: Diagnosis not present

## 2018-12-02 DIAGNOSIS — C7801 Secondary malignant neoplasm of right lung: Secondary | ICD-10-CM | POA: Diagnosis not present

## 2018-12-02 DIAGNOSIS — J9622 Acute and chronic respiratory failure with hypercapnia: Secondary | ICD-10-CM | POA: Diagnosis not present

## 2018-12-02 DIAGNOSIS — F419 Anxiety disorder, unspecified: Secondary | ICD-10-CM | POA: Diagnosis not present

## 2018-12-02 DIAGNOSIS — C787 Secondary malignant neoplasm of liver and intrahepatic bile duct: Secondary | ICD-10-CM | POA: Diagnosis not present

## 2018-12-09 ENCOUNTER — Encounter: Payer: Self-pay | Admitting: Pulmonary Disease

## 2018-12-09 ENCOUNTER — Telehealth: Payer: Self-pay | Admitting: Licensed Clinical Social Worker

## 2018-12-09 NOTE — Telephone Encounter (Signed)
Palliative Care SW left a vm with patient attempting to schedule a home visit.

## 2018-12-09 NOTE — Progress Notes (Signed)
Subjective:    Patient ID: [name], male    DOB: 01-27-1946, 73 y.o.   MRN: [id number]  HPI 73 y/o WM here for a follow up visit and on-going management of mult med problems...  ~  SEE PREV EPIC NOTES FOR OLDER DATA >>    CXR 7/14 showed COPD/ emphysema, incr markings and scarring at the bases, NAD...  LABS 7/14:  FLP- ok on Simva20;  Chems- ok x BS=120 A1c=6.7;  CBC- wnl;  TSH=1.26;  PSA=0.41...  [name] has established Primary Care follow up w/ [name] at [name]>>   CXR 7/15 showed underlying COPD, mild bibasilar fibrosis, norm heart size, DJD in spine, NAD...   PFT 7/15 showed> FVC=3.18 (78%), FEV1=1.22 (38%), %1sec=38; mid-flows=13% predicted... This is c/w GOLD Stage3 COPD and we discussed this...  ~  December 05, 2014:  51moROV & pulmonary recheck; [name] continued smoking, perhaps decr slightly to 1/2ppd but unable to quit & he has gained 6# in the interim up to 221# today;  His CC is sinusitis w/ nasal congestion, drainage & green mucus plus maxillary/ frontal HAs; he denies f/c/s, notes stable cough & chest symptoms w/ DOE;  He has been taking his Advair250Bid but prev refused addition of an anticholinergic due to "the donut hole";  States his breathing is "fine, except in cold air" ( we discussed wearing a scarf in winter etc)... He has GOLD Stage3 COPD w/ FEV1=1.22 (38%predicted) done 7/15;;  Exam shows decr BS bilat w/ few scat rhonchi & mild exp wheezing esp w/ forced maneuver...     We reviewed prob list, meds, xrays and labs> see below for updates >> [facility]... PLAN>> we discussed treating his acute max sinusitis w/ Augmentin875Bid, plus align daily & nasal saline as needed; he knows the importance of smoking cessation but is not motivated & declines smoking cessation help; he will continue the Advair250Bid 7 we are adding spiriva via handihaler once per day; we plan ROV in 669mo/ f/u CXR &  PFT, call in the interim for any breathing issues....   ~  June 05, 2015:  9m31moV & [name] a good interval, no new complaints or concerns; still smoking +/-1ppd and blames family stress; notes cough, sm amy yellow sput, no hemoptysis, SOB w/o change/stable- still mows 3 yards, does chores, tends to 20+chickens & their eggs, etc; he has repeatedly declined smoking cessation help, clearly not motivated to quit, not even doing his inhalers regularly!!!    COPD, +smoker> on Advair250Bid & Spiriva daily; symptoms as above, he is stoic, still smoking 1ppd & NOT motivated to quit, asked to cut back & use both inhalers regularly...    HBP> on Metop25Bid, Amlod10, Lisin40; BP=114/64 & he denies CP, palpit, SOB, edema...    CAD> on ASA81; followed by [name] & seen 4/15 (overdue for f/u)> CAD, IWMI 1995, mult PCIs, last Myoview 2013 was low risk; too sedentary, no changes made; 73 28o brother died w/ AAA rupture...    Chol> on Simva20; FLP 7/14 showed TChol 158, TG 145, HDL 33, LDL 96; needs better diet & wt reduction and ret for Fasting blood work overdue.    DM> on diet alone; LABS 7/14 showed BS= 120, A1c= 6.7; we reviewed low carb diet, exercise program, and wt reduction...    Obese> wt recorded as 204# today but it doesn't look like he's lost 26#; we  reviewed diet & exercise...    GI- Gastritis, polyps> prev on Zantac; prev colon 2004 w/ hyperplastic polyp removed & f/u 8/14 by [name] w/ 3 polyps removed (5-56m size, all were tubular adenomas), +divertics in sigmoid, f/u planned 5 yrs... NOTE> exam showed a small palp knot in mid-abd above the umbilicus ?etiology (I offered CT Abd but he declines & wants to discuss w/ his primary- [name]).    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    DJD> on OTC  analgesics as needed...    Anxiety> on Alpraz0.63m9mrn... We reviewed prob list, meds, xrays and labs> see below for updates >>  NOTE> HE IS FOLLOWED BY [name] for GEN MED/ PCP now...  CXR 06/05/15 showed stable heart size, Ao atherosclerosis & tortuousiy, COPD/emphysema, incr markings at the bases, arthritis in Tspine...  LABS 7/16: pt asked to ret for fasting blood work... IMP/PLAN>>  [name] he is stable but continues to smoke 1ppd; CXR w/ COPD/emphysema, atherosclerotic Ao, chronic changes but NAD- we discussed the low-dose screening CT Chest for the early detection of lung cancer (he is a candidate & he will consider participating in this program); in the meanwhile- continue Advair/ Spiriva regularly;  He is overdue for Cards f/u w/ [name], and needs to continue Q6mo78mo w/ [name] (w/ copy of notes to us);[place] will ret for FASTING blood work.  ADDENDUM>> given his age and smoking history, [name] candidate for LUNG CANCER SCREENING w/ LOW-DOSE CT Chest>>  This office visit constitutes a face-to-face visit for the purpose of lung cancer screening counseling and a shared decision making visit...  Eligibility for LDCT screening:  1) Age=70 (must be 55-77y/o);  2) Absence of symptoms of lung cancer: he denies CP, ch in SOB, hemoptysis.  Smoking History:  1) He has 60+ pk-yrs (must have >30 pk-yr hx smoking);  2) Smoking status- still smoking ~1ppd (current or former smoker who quit<15 yrs ago).  Counseling:  Importance of smoking cessation &/or continued abstinence discussed ; offered smoking cessation interventions- pt declined; pt agreed to continued LDCT screening annually.  Shared decision making:  We reviewed the potential benefits and harms of screening> eg. Early detection of lung cancer and improved survival, the need for additional diagnostic tests & possible surgery, the risk of over-diagnosis/ false positives/ and radiation exposure... All questions were answered  to the best of my ability & he would like to consider the LDCT screening & he will let us kKoreaw his decision=> he ignored this discussion & never called.  ~  October 31, 2015:  5 month ROV & post hospital follow up visit>  [name] was Adm 12/5 - 10/29/15 by [facility] w/ acute hypoxemic resp failure precipitated by a COPD exac; for his part he noted incr SOB, cough, green sput, & "slowing down", he denied f/c/s, no CP, etc;  In the ER he was hypoxemic w/ O2sats in the 70s on RA and eval revealed> CXR- COPD, bullous dis, incr markings at bases, NAD; Cultures of blood/ sput/ urine were all neg; Serologies for strep & legionella were neg; Viral panel was neg;  LABs were OK (WBC=7.9=>14, Hg=15.3=>13.9, BS=100-140, BNP=498);  EKG showed NSR, rate78, NSSTTWA;  2DEcho 10/21/15 showed mildLVH, norm LVF w/ EF=55-60%, no regional wall  motion abn, LA-mod dil, RA- mod to severe dil, RV- mod dil, mod TR, PAsys-34mHg... He was treated w/ Oxygen, IV Solumedrol, Rocephin/ Zithromax, NEBS, Lovenox, flutter, etc; he was disch on Home O2- 2L/min activ & 1L/min rest, Pred taper, Levaquin, Advair250/ Spiriva...  He returns today feeling better, notes his home pulse-ox checks are improving & he has less SOB/DOE, less cough/sput, etc;  He says he has not smoked since PTA...      EXAM shows Afeb, VSS, O2sat=94% on 2L/min [place];  Wt=210# (he was 204 prev);  HEENT- neg, mallampati1;  Chest- decr BS bilat, few scat rhonchi, no w/r/consolidation;  Heart- RR w/o m/r/g;  Abd- obese, soft;  Ext- neg w/o c/c/e...  IMP/PLAN>>  COPD/emphysema, GOLD Stage 3, chronic hypoxemic resp failure- on Home O2 now, exsmoker- quit 10/2015, pulmonary HTN (PAsys by EClarene Duke- WHO group 3>  I congratulated him on not smoking & we discussed continuing the Oxygen regularly ([facility], Prednisone taper, Advair, Spiriva, NEBS w/ Albut, Mucinex/flutter;  He will follow up in 3 wks when the Pred has been tapered & we briefly discussed the need for Full PFTs and CT  Chest later...   ~  November 21, 2015:  3wk ROV & MrWicker reports that he feels much better, SOB improved, cough improved, still prod of a sm amt green sput w/o blood w/o f/c/s;  He's been using the NEB w/ Albut TID followed by his AdvairBid & SpirivaQd; he is off the Pred now, he is too sedentary & not exercising => he needs PULM REHAB & we will refer, [facility] we will inquire as to what he might be elig for...       EXAM shows Afeb, VSS, O2sat=98% on 2L/min [place];  Wt=216# (he was 204-210# prev);  HEENT- neg, mallampati2;  Chest- decr BS bilat, otherw clear w/o w/r/r;  Heart- RR w/o m/r/g;  Abd- obese, soft;  Ext- neg w/o c/c/e...  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 We plan ROV in 6 wks.  ~  February 03, 2016:  2-349moOV & [name] w/ his severe obstructive lung dis, GOLD Stage 3, chr hypoxemic resp failure w/ pulmHTN (WHO group3); he quit smoking 10/2015 & is using home O2 3L/min, NEBS w/ Albut tid, Advair250Bid, Spiriva daily, Mucinex etc... He reports that breathing is stable overall but he is c/o 57m76mo URI w/ cough, thick yellow sput hard to produce, incr SOB 7 chest tightness but no fever, no CP=> he wants another antibiotic... He spent much time ventilating about his problems w/ DME-[facility], his O2 set-up, & [facility] program- they still haven't contacted him about starting Rehab... He does have mult entries into EPIC from TH[facility]attonotes reviewed...     EXAM shows Afeb, VSS, O2sat=92% on 3L/min [place];  Wt=231# (up 15# & he was 204-210# prev);  HEENT- neg, mallampati2;  Chest- decr BS bilat, otherw clear w/o w/r/r;  Heart- RR w/o m/r/g;  Abd- obese, soft;  Ext- neg w/o c/c/e.  PFT 02/03/16>  FVC=2.57 (68%), FEV1=1.05 (38%), %1sec=41, mid-flows reduced at 18% predicted;  post bronchodil FEV1 w/o change;  TLC=6.67 (107%), RV=3.97 (176%), RV/TLC=60;  DLCO=22% and DL/VA=29%... This is c/w a severe obstructive ventilatory defect w/ air trapping and severely decr DLCO c/w  emphysema- GOLD Stage 3 COPD... IMP/PLAN>>  We discussed continuing the NEBS Tid followed by Advair250Bid & Spiriva daily; add Mucinex 1266mBid & we will Rx w/ Levaquin500/d x7d for his bronchitic infection (see AVS);  He still needs a CT Chest but wants to wait for now;  [facility], and he wants diff DME supplier- we will inquire, plan ROV 364mo. ADDENDUM 03/18/16>> Pt unable to afford inhalers- ADVAIR & SPIRIVA discontinued and placed on NEBULIZER w/ DUONEB QID & PULMICORT 0.25 BID regularly (we will update med list to reflect this change).  ~  May 05, 2016:  30m29moV & [name] notes DOE w/ exertion- he is in PulHealth Netusing up to 6L/min w/ exercise (he tried Oxy[employer] 1 Automotiveusing 3-4L/min at rest & Qhs; he feels that he is making some progress & I reminded him the DIET + EXERCISE and wt loss would be very beneficial to his breathing; states he is walking & mowing on his off days (ie- when not in rehab); despite everything his wt continues to climb- now up 4# to 235# "exercise makes me hungry"... we reviewed the following [name] problems during today's office visit >> his new PCP is [name]...    COPD, ex-smoker, chr hypoxemic RF> on NEB w/ Duoneb Qid + Budes-Bid + HFA prn (Advair & Spiriva too $$); he finally quit smoking, symptoms as above, he is now in pulm rehab but having issues w/ low sats...    HBP> on MetopER-14m56mM&1PM), Amlod10, Lisin10, Lasix40Bid; BP=136/66 & he denies CP, palpit, SOB, edema...    CAD> on ASA81, Imdur30; followed by [name] & seen 04/2016> CAD, IWMI 1995, mult PCIs, last Myoview 2013 was low risk; too sedentary, edema decr w/ Lasix; 73 y77 brother died w/ AAA rupture...    Chol> on Simva20; FLP 12/16 showed TChol 138, TG 75, HDL 25, LDL 98; needs  better diet & wt reduction and better compliance.    DM> on diet alone; LABS 5/17 showed BS= 118, A1c= per PCP; we reviewed low carb diet, exercise program, and wt reduction...    Obese> wt = 235#, 5'6"Tall, BMI=38;  we reviewed diet & exercise...    GI- Gastritis, polyps> prev on Zantac; prev colon 2004 w/ hyperplastic polyp removed & f/u 8/14 by [name] w/ 3 polyps removed (5-7mm 42me, all were tubular adenomas), +divertics in sigmoid, f/u planned 5 yrs... NOTE> exam showed a small palp knot in mid-abd above the umbilicus ?etiology (I offered CT Abd but he declines & wants to discuss w/ his primary- [name]).    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     DJD> on OTC analgesics as needed...    Anxiety> on Alpraz0.5mg pr64m. EXAM shows Afeb, VSS, O2sat=92% on 3L/min [place];  Wt=231# (up 15# & he was 204-210# prev);  HEENT- neg, mallampati3;  Chest- decr BS bilat, otherw clear w/o w/r/r;  Heart- RR w/o m/r/g;  Abd- obese, soft;  Ext- neg w/o c/c/e. IMP/PLAN>>  [name] is rec to continue current meds regularly, get on diet & get wt down , continue in PulmRehab, etc; we wrote for a ProairHFA rescue inhaler for prn use...   ~  August 05, 2016:  30mo ROV89moast visit we rec continuing same meds but stressed DIET, EXERCISE, wt  reduction-- unfortunately he has GAINED 8# up to 243# (BMI=39) in the interval;  He states that "I don't get hungry, I only eat one sandwich & sometimes a salad at night;  He says "they kicked me out" of the [facility] program due to low oxygens even w/ use of the Oxymizer;  He says he is exercising at home now;  He has established PCP w/ [name] & apparently he did labs which pt reports were "OK".    [name] c/o cough, chest congestion, yellow sput, ans a "sinus infection" w/ yellow drainage; notes incr  SOB but denies CP/ f/c/s, edema;   EXAM shows Afeb, VSS, O2sat=92% on 3L/min [place];  Wt=231# (up 15# & he was 204-210# prev);  HEENT- neg, mallampati3;  Chest- decr BS bilat, otherw clear w/o w/r/r;  Heart- RR w/o m/r/g;  Abd- obese, soft;  Ext- neg w/o c/c/e. IMP/PLAN>>  [name] describes an upper resp infection & COPD exac=> Rx w/ Levaquin, Pred58m-4d taper, + his NEBS w/ duoneb Qid & Budes Bid; he desperately needs to get the weight down...  ~  October 13, 2016:  EPatryckreturns and says "I've been worse" & indicates that he really likes the BiPAP he was placed on & disch with after his last Hosp 11/10 - 09/29/16; records reviewed by me- Adm w/ cough, yellow sput, chills and incr SOB; CXR revealed COPD, incr markings & bibasilar atx; Sput/ blood cultures and resp viral panel were all neg; LABS showed WBC~13K, anemia w/ Hg~11, Chems- ok x elev BS (his PCP is [name]); BNP was 29; ABGs showed pH=7.38, pCO2=58, pO2=79 on [place] O2; note that Bicarb level ~30;  2DEcho showed EF 60-65%, Gr1DD, PAsys~753mg;  He was felt to has a COPD exac, Cor Pulmonale- treated w/ BiPAP (he was INTOL to CPAP), given IV Solumedrol (DC on Pred taper), IV Zithromax, NEB treatments and improved...    He now returns & notes that he likes the BiPAP, feels like it is really helping (resting better, sleeps thru the night, wakes refreshed, no daytime sleepiness, & naps 2/7 days now) & wants to continue the BiPAP; NOTE that we had rec a sleep study to him on mult occas but he has always refused!  He now agrees to a sleep study, hoping that it will lead to medicare approval for his home BiPAP => HE QUALIFIES FOR BiPAP BASED ON COPD DIAGNOSIS w/ ABG SHOWING pCO2>52 ON HIS NASAL CANNULA O2, he needs the SLEEP OXIMETRY STUDY confirming O2sat<88% for cumulative >53m50mwhile on his prescribed O2, and OSA/CPAP treatment have been considered & ruled out... He has a home O2conc & a POC which he uses 3-4L pulse dose w/ his home sats in the 90s at rest &  down to 88% w/ exerc...     COPD, mixed type w/ emphysema>  He has GOLD Stage3 COPD & on O2 as noted, NEBS w/ DUONEB Qid & BUDESONIDE0.25Bid, plus ProairHFA prn when out & about; [facility]...    Ex-smoker>  He quit late in 2016...    Chronic resp failure w/ hypoxemia and hypercarbia>  On above meds + ASA, Metoprolol, Imdur, Lasix    Cor pulmonale, secondary pulm hypertension>  He is on O2 24/7 using 4L/min continuous at night & 4L/min pulse dose during the day...    CARDIAC issues>  HBP, CAD w/ IWMI 1995 & mult PCIs, HL (on Simva20)- all followed by [name]...    [name]  issues>  HBP, HL, DM, obesity, gastritis, divertics, colon polyps, BPH, hx bladder cancer, DJD, anxiety EXAM shows Afeb, VSS, O2sat=97% on 4L/min [place];  Wt=240#;  HEENT- neg, mallampati3;  Chest- decr BS bilat, otherw clear w/o w/r/r;  Heart- RR w/o m/r/g;  Abd- obese, soft;  Ext- neg w/o c/c/e.  CXR 09/26/16>  Norm heart size, incr bibasilar atx... IMP/PLAN>>  [name] needs to continue his O2, NEBS w/ Duoneb & Budes regularly; he wants to continue the BiPAP & we need to see if he meets Medicare's criteria for BiPAP device- proceed w/ Sleep Study...   ADDENDUM>>  Sleep Study 10/15/16>  AHI=1.8/hr (8 hypopneas & 4 RERAs;  AHI during REM = 17.6/hr;  Study done on 4LO2 & min O2sat= 88%;  Mod snoring noted;  No arrhythmias;  PLMS index= 37, but assoc arousals was only 0.2;  He appears to meet the criteria for BiPAP & we will submit to [employer]/ [employer]... Plan ROV recheck in 6-8 weeks.  ~  November 24, 2016:  6wks ROV and pulmonary follow up visit>  [name] is stable w/ his severe disease- on BiPAP Qhs, Home O2- 24/7, NEB w/ Duoneb Qid & Budes Bid, he has rescue inhaler and [place] nasal mist;  His weight is all the way up to 257# (up 18# in 6wks) and c/o cough off & on, small amt yellow sput, no f/c/s, SOB "the same" but notes incr DOE w/ walking; unfortunately he has been very sedentary- not  exercising at all (prev in pulm rehab & they discharged him from the program)...     SEE PROBLEM LIST-- above... EXAM shows Afeb, VSS, O2sat=97% on 4L/min [place];  Wt=240#;  HEENT- neg, mallampati3;  Chest- decr BS bilat, otherw clear w/o w/r/r;  Heart- RR w/o m/r/g;  Abd- obese, soft;  Ext- neg w/o c/c/e.  LABS 11/24/16>  Chems- ok w/ BS=117, Cr=0.64, LFTs=wnl;  CBC- anemic w/ Hg=10.77mv=95, wbc-norm;  Fe=191 (sat=56%), Ferritin=109;  TSH=1.28...  Abd SONAR => ordered but never done...  Last CT Chest 08/19/16> mild cardiomeg w/ aortic & coronary atherosclerotic changes & PA enlargement w/ 3.4cm outflow tract; no adenopathy; severe bullous emphysema; bilat pulm nodules w/ 7.233mRUL nodule & 7.9 LLL lesion; Abd showssmall gallstones vs sludge & 6cm right renal cyst, mod thoracic spondylosis...  Last 2DEcho 09/29/16> mod conc LVH, norm LVF w/ EF=60-65%, no regional wall motion abn, Gr1 DD, norm valves, RV dil w/ norm sys function, PAsys=74 mmHg...  Last ONO > cannot find results IMP?PLAN>>  ErQuintaviusas severe COPD/emphysema w/ pulmHTN & CorPulmonale; he refuses to fill inhalers due to costs & he is therefore on O2- 24/7, BiPAP nightly, NEBS w/ Duoneb Qid & Budes Bid, Mucinex 1200Bid, & asked to get into a regular exercise program;  He will continue regular f/u w/ CARDS- [name]; must work on weight reduction...  ~  January 27, 2017:  3m53moV & [name] was ADM by [facility] 3/7 - 01/26/17 w/ Acute on Chronic diastolic CHF, dilated cardiomyopathy, COPD exac, acute on chr resp failure;  He was diureses 30# of fluid 257# to 227# today but he is quite vehemently opposed to this being diastolicCHF and says it was pneumonia & wouldn't have happened if he had an antibiotic when he needed it (his words); Pulm consult from DrYacoub also remarked about pt being very upset at suggestion of CHF & demanded to see pulmonary...             ...Marland KitchenMarland Kitchen CXR 01/22/17 showed cardiomegaly, aortic  atherosclerosis, bilat emphysema & interstitial  prominence in the bases, no consolidation or effusion..  EKGs 01/2017 showed NSR, poor R prog V1-2, one PVC pair...  2DEcho 01/20/17 was a poor quality study  But showed mild conc LVH (prev mod), norm LVF w/ EF=55-60% & no RWMA, Ao root mildly dil, AoV mild calcif leaflets, MV norm, LAdil 34m, RV dil, norm RVF, PAsys- not meas [id number]  LABS showed BS~130-180 range, Cr=0.8-1.1 range, Hg~10-11 range, WBC~10=>6K, Troponins- neg, BNP=35...    He was treated w/ [id number] placed on BiPAP, Oxygen, NEBS, Solumedrol, & empiric antibiotics; He was seen by Pulmonary & transitioned to oral Levaquin, Prednisone, & continued on his home regimen of Oxygen- 24/7, BiPAP Qhs, NEBS w/ DUONEB Qid & Budesonide Bid, Mucinex 12034mBid; Lasix trimmed to 4047md at disch yest... Since disch he is c/o "weak", notes some cough, sput, & SOB; we discussed keeping an Ab on hand so he can take it when he wants at the earliest sign of a resp infection- Levaquin 500m52mily #10 w/ refill written at his request, reminded to always take a probiotic like ALIGN & activia yogurt while on the broad spectrum antibiotics...     EXAM shows Afeb, VSS, O2sat=97% on 4L/min [place];  Wt=227#;  HEENT- neg, mallampati3;  Chest- decr BS bilat, otherw clear w/o w/r/r;  Heart- RR w/o m/r/g;  Abd- obese, soft;  Ext- neg w/o c/c/e. IMP/PLAN>>  ErneBraden severe end-stage COPD- mixed chr bronchitis & emphysema w/ chr hypoxemia & hypercarbic resp failure, w/ Hx pulmHTN & right heart failure;  Now improved and approaching his baseline on max meds=> O2 at 2-4L/min 24/7 (he has [employer] that supplies 2L/min continuous or up to 6L/min pulse dose flow), nocturnal BiPAP, NEBs w/ Duoneb Qid & Budes 0.25Bid, Mucinex 1200mg39m weaning oral Prednisone from his recent hosp, finished oral Levaquin; on ASA81, Lasix40Bid, Avapro75, Imdur30, K20, Zocor20, and off prev Metoprolol... We discussed continuing the Pred taper til gone, continue other meds the same for now & ROV  recheck 42mo..58mote: he has been followed for Cards by [name] (HBP, CAD w/ IWMI 1995 & mult PCIs, HL) and last seen in 201`5 but he saw Bartlett Enke Richardson Dopp 5-04/2016...  ADDENDUM>> CT Chest 02/17/17 (59mo fo79mo up CT Chest- compare to 08/19/16)>> mild cadiomeg w/ atherosclerotic calcif in Ao/ great vessels/ coronaries; no enlarged adenopathy; extensive bullous emphysema, areas of mild interstitial prominence, stable nodular opacities (largest 1.3 x 0.9 cm in inferior LLL abutting the pleura) & no new parenchymal lung opacity; upper abd shows atherosclerotic calcif of Ao, right renal cyst, & an incompletely vis mass in mid abd=> needs CT Abd... ADDENDUM>> CT Abd & Pelvis 02/18/17 showed a large 25 x 16 x 18 cm mass in right abd mesentary, no adenopathy, additional findings include ao & coronary calcif, mod centrilob emphysema, diffuse hepatic steatosis, cholelithiasis & mod left colonic diverticulosis...   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);  [facility]/ eval>>     [facility] DrLevineBull [place] Dx w/ soft tissue sarcoma right abd, felt to be high risk for surg due to COPD & cardiac issues=> rec referral to Pulm, [name]  [facility]    XRT by DrBlackstock- [facility] 03/18/17>  They planned 45 Gy in 25 fractions- completed by 05/06/17, taking imodium for loose stools    Pulmonary consult from DrPascual 04/28/17>  He felt that abd surg would pose a high risk of pulm complications given his age, chronic hypercarbic & hypoxemic resp failure, poor exercise capacity, & pulm HTN; in addition they felt he was on a good triple therapy regimen & did not pose any changes...    Pt notes that his abd has shrunk & his breathing  is sl better; still using 4-6L/min O2 & checks his pulse ox readings; he notes the O2 also helps his leg cramps "the nurse says it's oxygen deprivation" (using [facility]); he remains on BiPAP Qhs & rests well, wakes refreshed, denies daytime sleepiness;  He has been regular w/ his NEB rx using Duoneb Qid & Budesonide Bid + AlbutHFA rescue inhaler as needed...     EXAM shows Afeb, VSS, O2sat=99% on 4L/min [place];  Wt=227#;  HEENT- neg, mallampati3;  Chest- decr BS bilat, otherw clear w/o w/r/r;  Heart- RR w/o m/r/g;  Abd- protuberant abd, soft & nontender, norm BS;  Ext- VI, +1edema.  Last CXR was 01/22/17 showing cardiomegaly, aortic atherosclerosis, bilat emphysema & interstitial prominence in the bases, no consolidation or effusion..  Last CT as above - [facility] abd CT in August (after the XRT treatment)...  PFTs in Care Everywhere>  [facility] 04/28/17 showed FVC=2.60 (70%), FEV1=0.97 (36%), %1sec=37%...  Labs in Care everywhere 04/2017>  Chems- ok x BS=125, Cr=0.64, LFTs=wnl... 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  ~  October 10, 2017:  26moROV & EAlexieindicates to me that he was told the abd tumor mass had not shrunk enough to consider surgery, and the Oncology team is holding off on ChemoRx until it's necessary for tumor growing, recently it has been about the same... From the pulmonary standpoint he feels that he is breathing OK- notes tired & sl SOB in the afternoon, decr cough & sl beige sput, no hemoptysis; he is NOT very active & getting [facility] for PT;  Using O2 at 4L/min & O2sat=94%... We reviewed the following NOTES is Epic Care  Everywhere>    [facility]- he has completed 25 fractions of XRT on 05/06/17, notes some improvement in breathing & has decr O2 to 5L/min; notes some decr abd & leg swelling;  CT Abd&Pelvis [facility]24/17 at [facility]>  IMPRESSION: 1)Stable large heterogeneously enhancing mass in the right abdomen compatible with spindle cell neoplasm. 2)825mhyperenhancing lesion in the left hepatic lobe is too small to characterize but favored to represent a flash filling hemangioma or arterioportal shunt. 3)Stable subcentimeter pulmonary nodules as described above. Severe emphysema. Given smoking history, Fleischner guidelines recommend follow-up chest CT in 18 months. 4)Stable right hilar lymph node measuring 1.3 cm short axis, favor reactive. 5)(Hepatic steatosis. 6)Splenomegaly.  DrLevine felt he was too high risk for surg based on his pulm & cardiac issues; REC for watchful waiting & f/u w/ med oncology for poss  chemoRx when approp...    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 2) Progressively enlarging left adrenal nodule, currently measuring 9 mm in diameter. Given the avid enhancement and enlargement, this would be concerning for a potential site of metastasis. 3) Extensive pulmonary emphysema. Small pulmonary nodules are indeterminate, but show no interval change from the oldest available comparison CT examinations. Continued attention on follow-up. 4) Additional unchanged findings as noted above;  They rec f/u CT scans in 4 months & close observation... PT was ordered for the pt...    10/03/17 GENETIC Testing results>  Mr. Samul Mcinroy was  contacted by telephone today to discuss his negative genetic test results. No pathogenic mutations were detected on a 43-gene panel for evaluation of hereditary cancer. Genetic testing was pursued based on patient's extensive personal and family history of cancer.   We reviewed the following [name] problems during today's office visit>     COPD, mixed type w/ emphysema>  He has GOLD Stage3 COPD & on O2 as noted, NEBS w/ DUONEB Qid & BUDESONIDE0.25Bid, plus ProairHFA prn when out & about; off the Pred Rx given last during his 09/2016 [facility] & tapered off...    Ex-smoker>  He quit late in 2016...    Chronic resp failure w/ hypoxemia and hypercarbia>  On above meds + Lasix40Bid, K20; also on O2 at 4L/min regularly & BiPAP Qhs=> need to derview download data...    Cor pulmonale, secondary pulm hypertension>  He is on O2 24/7 using 4L/min continuous at night & 4L/min pulse dose during the day...    Intra-abdominal SARCOMA>  See above- eval & management from [place], XRT, Surg Oncology...    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    [name] issues>  HBP, HL, DM, obesity, gastritis, divertics, colon polyps, BPH, hx bladder cancer, DJD, anxiety  EXAM shows Afeb, VSS, O2sat=94% on 4L/min [place];  Wt=235#;  HEENT- neg, mallampati3;  Chest- decr BS bilat, otherw clear w/o w/r/r;  Heart- RR w/o m/r/g;  Abd- protuberant abd, soft & nontender, norm BS;  Ext- VI, +1edema.  LABS> pt reports labs from [name] office recently were all OK... IMP/PLAN>>  I would like to see a BiPAP download, continue current meds the same & f/u scans as planned at [name]; we will recheck pt in 30mo sooner if needed prn...  ~  January 10, 2018:  369moOV & pulmonary/ [name] follow up visit> ErJoanneeturns and indicates that he is doing better overall, feels that his breathing is at baseline but he arrives today in wheelchair w/  his O2 at 4L/min via POC- he continues on his NEB Qid, Budes Bid, nightly BiPAP & his oxygen;  We can't get download from [place] says because Medicare says he didn't qualify & won't pay them?)  He notes appetitie is good, eating well he says;  He continues getting home PT from [facility]... We reviewed the following interval Epic/ Care Everywhere records>      [name]>  F/u primary intra-abd spindle-cell sarcoma, s/p  XRT w/ 45Gy in 25 fragments betw 5/17 - 05/06/17;  CT Abd&Pelvis [facility]/17 at [facility]>  CONCLUSION:  1) 1.5 cm left adrenal nodule, increased in size and concerning for metastasis.  2) 1.6 cm left hepatic lobe hypodensity measures slightly larger, concerning for malignancy. Consider liver MRI to further assess or attention on follow-up. Liver has a similar lobular contour and cirrhosis is not excluded.  3) 25 x 14 x 17 cm right lower quadrant spindle cell carcinoma is similar in size/appearance.  4) Left lung pleural-based or subpleural nodules are similar to slightly decreased. Prominent right hilar node is similar. Continued attention on follow up is advised.  5) 1.7 cm nodule arising from the thyroid isthmus, similar compared to prior. Consider thyroid ultrasound for further evaluation.  Oncology is considering IR- ablation of the liver & adrenal lesions... We reviewed the following [name] problems during today's office visit>     COPD, mixed type w/ emphysema>  He has GOLD Stage3 COPD & on O2-4L/min as noted, NEBS w/ DUONEB Qid & BUDESONIDE0.25Bid, plus ProairHFA prn when out & about; off the Pred Rx given last during his 09/2016 [facility] & tapered off...    Ex-smoker>  He quit late in 2016...    Chronic resp failure w/ hypoxemia and hypercarbia>  On above meds + Lasix40Bid, K20; also on O2 at 4L/min regularly & BiPAP Qhs=> need to review download data...    Cor pulmonale, secondary pulm hypertension>  He is on O2 24/7 using 4L/min continuous at night &  4L/min pulse dose during the day...    Intra-abdominal SARCOMA w/ mets>  See above- eval & management from [place], XRT, Surg Oncology, Interventional Radiology...    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    [name] issues>  HBP, HL, DM, obesity, gastritis, divertics, colon polyps, BPH, hx bladder cancer, DJD, anxiety EXAM shows Afeb, VSS, O2sat=94% on 4L/min [place];  Wt=235#;  HEENT- neg, mallampati3;  Chest- decr BS bilat, otherw clear w/o w/r/r;  Heart- RR w/o m/r/g;  Abd- protuberant abd, soft & nontender, norm BS;  Ext- VI, +1edema. IMP/PLAN>>  He is at baseline from the pulm standpoint- continue current Opxygen, BiPAP Qhs, Duoneb Qid & Budes Bid;  On-going management of his intra-abdominal sarcoma by [place] mutidisciplinary team...   ~  Apr 12, 2018:  42moROV & [name] states that his breathing is fair & stable on a regimen of DUONEB Qid, Budes0.25 via neb Bid, AlbutHFA rescue inhaler as needed;  He remains on O2 at 4L/min, BiPAP but we can't get download (Ace Ginssaid he didn't qualify & Medicare won't pay them);  He denies cough, notes sm amt green sput, no hemoptysis, SOB/DOE is about the same he says (eg- with walking but he tilled garden w/ tractor)...  We reviewed the following interval Epic/ Care Everywhere records>       [facility]  Impression:   1.Continued upward trend in the size of the left hepatic lobe hypodensity with rim enhancement which is likely a metastasis.  2.Similar size of the 1.5 cm left adrenal nodule concerning for metastasis. .[name] 3.Similar size and appearance of the large right lower quadrant spindle cell carcinoma. 4.No interval change in the pulmonary nodules as further described above. 5.Stable size of a prominent right hilar lymph node. 6.Similar size of the 1.7 cm hyperdense nodule arising  the thyroid isthmus.  Consider thyroid ultrasound for further evaluation.     CT OF THE ABDOMEN WITH INTRAVENOUS CONTRAST (CIRRHOSIS PROTOCOL) 03/06/18> Redemonstrated ill-defined hypodense lesion with peripheral enhancement involving the left hepatic lobe. As previously discussed, continued enlargement over multiple studies is concerning for metastatic involvement. No additional suspicious focal liver lesions appreciated.      [facility]/18 at [facility]>  CBC- Hg=8.3, mcv=96, WBC=3.9, Fe=58 (19%sat), Ferritin=274;  Chems- K=4.4, BS=120, Cr=0.89, LFTs are wnl DrHolwerta transfussed him 2u packed cells (A+) on 01/18/18 when Hg=7.8.Marland KitchenMarland Kitchen He had FNA of thyroid nodule on 03/01/18 w/ PATH= benign follicular nodule...    He [facility] DrRaj- [facility] on 02/28/18>  His excellent summary note is reviewed; most recent CT (above) w/ incr size of liver met & new left adrenal met => refer to IR for consideration of Ablation...    He saw Dr.TDowning-[facility] to discuss Liver Ablation on 03/28/18>  Note reviewed, 2.5cm nodule in left hepatic lobe, and 1.4cm left adrenal nodule, hx prev XRT to the abd mass (45 Gy in 25 fractions completed 05/06/17);  They are planning percutaneous microwave ablation of the liver lesion and cryoablation of the left adrenal lesion (done 04/25/18) We reviewed the following [name] problems during today's office visit>     COPD, mixed type w/ emphysema>  He has GOLD Stage3 COPD & on O2-4L/min as noted, NEBS w/ DUONEB Qid & BUDESONIDE0.25Bid, plus ProairHFA prn when out & about; off the Pred Rx given last during his 09/2016 [facility] & tapered off...    Ex-smoker>  He quit late in 2016...    Chronic resp failure w/ hypoxemia and hypercarbia>  On above meds + Lasix40Bid, K20; also on O2 at 4L/min regularly & BiPAP Qhs=> need to review download data...    Cor pulmonale, secondary pulm hypertension>  He is on O2 24/7 using 4L/min continuous at night & 4L/min pulse dose during the day...    Intra-abdominal SARCOMA w/ mets>  See above- eval & management  from [place], XRT, Surg Oncology, Interventional Radiology...    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    [name] issues>  HBP, HL, DM, obesity, gastritis, divertics, colon polyps, BPH, hx bladder cancer, DJD, anxiety... His PCP is DrHolwerta. EXAM shows Afeb, VSS, O2sat=99% on 4L/min [place];  Wt=227#;  HEENT- neg, mallampati3;  Chest- decr BS bilat, otherw clear w/o w/r/r;  Heart- RR w/o m/r/g;  Abd- protuberant abd, soft & nontender, norm BS;  Ext- VI, +1edema. IMP/PLAN>>  [name] has on-going issues w/ the intra-abd sarcoma (s/p XRT at [facility]) & apparent mets to liver & adrenal (planned ablations by IR at [name]); he was anemic & required Tx raising the concern for bleeding- [facility] [facility] are aware;  Continue same pulmonary Rx...  ~  July 13, 2018:  61moROV & [name]-- 1st was 6/29 - 05/18/18 by [facility] w/ GIB- bloody stools and anemia; then re-admitted 7/24-28/2019 by [facility] w/ severe GIBleed & [facility]/17/18 (see below); today he is c/o sinus infection w/ drainage, cough, yellow/green mucus, & incr SOB=> we decided to treat w/ Levaquin & Pred taper...    1st ADM [facility] 6/29 - 05/18/18 by [facility]>  Presented w/ bloody stools & anemia, known hx intra-abd sarcoma, COPD on BiPAP Qhs & home O2, CAD- s/p stents & DiastolicCHF; he was  transfused, felt to be too chr ill for colonoscopy & bleeding appeared to stop spont- no interventions necessary (divertic bleed was suspected); ASA was stopped & NSAIDs avoided, started on Protonix; hemodynamically stable & euvolemic...  CT Abd & pelvis 05/13/18>  desc & sigm diverticulosis w/o diverticulitis; stable mass within the mesentary of right abd ~20 x 18 x 16 cm; coronary atherosclerosis + mod centrilob & paraseptal emphysema at lung bases; +hepatic steatosis + stable 4.7 x 3.5 x 3.6 cm hypodense lesion  in left lobe; +small gallstones near the neck; fatty replacement of pancreas;  Left adrenal nodule, cyst right kidney, atherosclerotic abd aorta...  CTA Abd&Pelvis 05/14/18>  No specific explanation for GIB, no active hemorrhage, extensive distal colon diverticulosis; known spindle cell carcinoma in RLQ, left hepatic & left adrenal nodules, cholelithiasis  LABS 6=>7/19:  CBC- Hg=7.1 => 9.2;  He was transfused;  Chems- ok...    2nd ADM at [name] 7/24 - 06/11/18 by [facility]>  Recurrent GIB initially dark=> BRB and anemic w/ Hg=6.7 but hemodynamically stable; CT Abd w/o acute changes, CTA w/o bleeding site ident; he was transfused & GI team [facility]    Transferred to [place] 7/28 - 06/17/18>  140 page DC summary in [facility] has been reviewed by me-- the bleeding was felt to be due to the necrotic mesenteric mass but they didn't place coils because it stopped bleeding spontaneously... Pt ventilated about a bad experience w/ a resident physician at [facility]... We reviewed the following [name] problems during today's office visit>     COPD, mixed type w/ emphysema>  He has GOLD Stage3 COPD & on O2-4L/min as noted, NEBS w/ DUONEB Qid & BUDESONIDE0.25Bid, plus ProairHFA prn when out & about; off the Pred Rx given last during his 09/2016 [facility].    Ex-smoker>  He quit late in 2016...    Chronic resp failure w/ hypoxemia and hypercarbia>  On above meds + Lasix40Bid, K20; also on O2 at 4L/min regularly & BiPAP Qhs=> need to review download data...    Cor pulmonale, secondary pulm hypertension>  He is on O2 24/7 using 4L/min continuous at night & 4L/min pulse dose during the day...    Intra-abdominal SARCOMA w/ mets>  See above- eval & management from [place], XRT, Surg Oncology, Interventional Radiology; he is s/p ablation therapy 6/19 by [facility] for his liver & adrenal met...    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    [name] issues>  HBP, HL, DM, obesity, gastritis, divertics, colon polyps, BPH, hx bladder cancer, DJD, anxiety... His PCP is DrHolwerta. EXAM shows Afeb, VSS, O2sat=94% on 4L/min [place];  Wt=224#;  HEENT- neg, mallampati3;  Chest- decr BS bilat, otherw clear w/o w/r/r;  Heart- RR w/o m/r/g;  Abd- protuberant abd, soft & nontender, norm BS, lsrge mass is papl;  Ext- VI, +1edema. IMP/PLAN>> [name] has been thru it- known large intra-abd sarcoma, mets to liver, adrenal,?lung and s/p IR ablation therapy to liver & adrenal met; then active GI bleed July=>Aug as above; now c/o sinus infection & COPD exac for which we will treat w/ Levaquin & Pred course... we plan rov recheck in 6mo..  ~  September 13, 2018:  274moOV & [name] notes that he was feeling exhausted, tired, weak & DrHolwerta checked pt=> anemic & they gave him 2u packed cells yest=> reports feeling better;  He is followed by Palliative Care here in [facility] & their notes are reviewed;  He is sched for f/u CT & LABS at [facility] in November... Pt is still talking about his bad experience w/ one partic resident physician seen during his last Hosp [facility]...    He has COPD- mixed chr bronchitic & emphysema with hypoxemic & hypercarbic resp failure> he remains on O2 at 4L/min (incr to 6L/min when ambulatory) & BiPAP Qhs; NEBS w/ DuonebQid and, Budesonide Bid, Mucinex 1260m Bid... he completed his Levaquin & Pred taper prescribed last visit & responded nicely to Rx...    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    [name] issues>  HBP, HL, DM, obesity, gastritis, divertics, colon polyps, GIB,  BPH, hx bladder cancer, DJD, anxiety, and anemia from the GIB..Marland KitchenMarland KitchenHis PCP is DrHolwerta. EXAM shows Afeb, VSS, O2sat=98% on 4L/min [place];  Wt=230#;  HEENT- neg, mallampati3;  Chest- decr BS bilat, otherw clear w/o w/r/r;  Heart- RR w/o m/r/g;  Abd- protuberant abd, soft & nontender, norm BS, large mass is papl;  Ext- VI, +1edema. IMP/PLAN>>  [name] is stable & [facility]u by onc[facility];  He is followed closely by [facility] recently found to be anemic & give 2u packed cells (we do not have notes or labs to review;  We discussed my up-coming retirement & will set him up to see DrIcard in 2-336mo.   ~  October 26, 2018:  6wk ROV [name] is sched for TACE & microwave ablation of his liver met from his large intra-abd sarcoma on 11/30/2018;  He continues to see Palliative Care team making home visits periodically;  [name] for SOB & Dx w/ a COPD exac/ CAP/ acute on chr hypoxemic resp fail/ chr diasCHF;  Notes reviewed in Care Everywhere-- CXR showed bibasilar opac concerning for poss asp vs CAP and severe centrilob emphysema;  CT Angio Chest revealed:  1) No evidence of acute pulmonary thromboembolic disease. 2) Interval increase in number of multiple subcentimeter mediastinal nodes and increase in size of right hilar node concerning for metastatic disease. 3) Similar to slightly increased size of pulmonary nodules as above concerning for metastatic disease. 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 Per prior recommendations, consider urology referral and attention on follow-up CT. 5) Partially imaged abdominal mass consistent with known sarcoma.  LABS showed: Hg=9.0, WBC=6.7K, BS~140, LFTs ok, Alb=4.3, BNP=50, Trop sl elev at 0.52;  Treated w/ Rocephin/ Zithromax=> Augmentin, Prednisone, diuresis w/ 9L neg balance, and disch on Lasix40Bid...   NOTE: prev CT Chest/Abd/Pelvis 10/03/18 showed: 1) Interval development of heterogeneous postcontrast  enhancement along the anterior aspect of ablated lesion in left hepatic lobe compared to 06/13/2018 exam. Additional 1.3 cm lesion of similar attenuation adjacent to the former cavity. These findings are concerning for tumor recurrence. 2) New lung nodule in left upper lobe with evidence of interval increase in right upper lobe nodule since 02/28/2018 exam as detailed above. Finding concerning for progression of metastatic disease 3) Stable size and appearance of the large  abdominal mass consistent with the known sarcoma  4) No evidence of tumor recurrence at the post ablation site in left adrenal gland  5) Stable size and appearance of complex right renal cystic lesion consistent with Bosniak 38F lesion. Consider urology referral and attention on CT follow-up.    He feels that he is back to baseline at present- mild cough, small amt yellow sput, no hemoptysis, similar SOB/DOE w/ ADLs (MMRC grade 4), no CP, denies f/c/s... Current meds: O2 24/7, nocturnal home vent, NEBs w/ Duoneb Qid,& Budesonide Bid, Mucinex600-2Bid... We reviewed the following [name] problems during today's office visit >>     He has COPD- mixed chr bronchitic & emphysema with hypoxemic & hypercarbic resp failure & prob secondary pulmHTN> he remains on O2 at 4L/min (incr to 6L/min when ambulatory) & BiPAP Qhs; NEBS w/ DuonebQid and, Budesonide Bid, Mucinex 1278m Bid... he completed his Augmentin & Pred taper given during recent hosp & responded nicely to Rx...    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    CARDIAC issues>  HBP, CAD w/ IWMI 1995 & mult PCIs, DiastCHF, HL (on Simva20)- all followed by [name] off ASA81 due to hx bleeding but still on Lisin10, Imdur30, Lasix40...    [name] issues>  HBP, HL, DM, obesity, gastritis,  divertics, colon polyps, GIB, BPH, hx bladder cancer, DJD, anxiety, and anemia from the GIB..Marland KitchenMarland KitchenHis PCP is DrHolwerta. EXAM shows Afeb, VSS, O2sat=98% on 4-6L/min [place];  Wt=225#;  HEENT- neg, mallampati3;  Chest- decr BS bilat, otherw clear w/o w/r/r;  Heart- RR w/o m/r/g;  Abd- protuberant abd, soft & nontender, norm BS, large mass is palp;  Ext- VI, +Tr edema. IMP/PLAN>>  [name]'s breathing is stable [facility], Pred taper, and 9L diuresis;  He is asked to continue NEBS w/ Duoneb Qid/ Budes Bid, Mucinex600-2Bid, Lasix40Bid;  He is sched for further IR treatment of liver metastatic dis from his large intra-abd spindle cell sarcoma; IR- DrDowning, Oncology- DrRaj... we discussed my upcoming retirement and he [place] will arrange for an appt w/ one of my young partners after his planned additional liver ablation procedure...           Problem List:  Hx of SINUSITIS (ICD-473.9) - off Nasonex now & recent Rx w/ Augmentin/ Saline... notes occas sinus congestion, drainage, sl HA, etc... he knows that he needs to quit smoking! ~  10/10: Adm for epistaxis w/ eval by DrWolicki- CXR= NAD, labs OK, rec for saline lavage- nose bleed resolved & disch home (no recurrence so far). ~  1/16: he presented w/ acute max & frontal sinusitis> Rx w/ Augmentin, Align, Saline, Mucinex, etc... ~  3/17: he has acute bronchitic exac- treated w/ Levaquin x7d + his regular meds...  COPD (ICD-496) >>   ~  on ADVAIR 250Bid; STILL SMOKES CIGARETTES & he's refused Chantix...  ~  He had hemoptysis 6/99 without lesion on CXR, CTChest, or Bronchoscopy... ~  Adm 10/10 w/ epistaxsis & eval DrWolicki as noted> he wanted to do Sleep Study but pt cancelled it & declined to resched. ~  CXR 5/11 showed COPD/ bullous emphysema, NAD..[name] ~  CXR 5/12 showed ectatic calcif & tort Aorta, COPD w/ incr markings ar bases, NAD, +degen spondylosis. ~  CXR 6/13 showed COPD/ emphysema, scarring  in the lower lobes, otherw clear lungs, DJD in spine... ~  CXR 7/14 showed COPD/ emphysema, incr markings and scarring at the bases, NAD. ~  1/15: [name] says he's decr his smoking from prev 3-4ppd down to 3 cig/d;  He remains on Advair250> states breathing is ok, at baseline CAT score=12; baseline cough, sput, no hemoptysis, stable DOE, no edema... ~  CXR 7/15 showed underlying COPD, mild bibasilar fibrosis, norm heart size, DJD in spine, NAD...  ~  PFT 7/15 showed> FVC=3.18 (78%), FEV1=1.22 (38%), %1sec=38; mid-flows=13% predicted... This is c/w GOLD Stage3 COPD and we discussed this> He is rec to quit all smoking immediately; he has been using the nicotine gum & rec to try patches, offered Wellbutrin, he refuses Chantix; needs to continue ADVAIR250Bid & samples givem; he declines to start [place] at this time due to cost & donut hole (we will address this again on return); reminded to use MUCINEX 658m- 1to2 Bid w/ fluids, and OK to use OTC DELSYM as needed for cough...   ~  1/16: he claims stable on Advair250 but still smoking & can't vs won't quit; we decided to add Spiriva daily via handihaler... 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 ~  12/16:  [facility], COPD exac, and found to have pulmHTN w/ 2DEcho showing PAsys=51;  Treated w/ O2, Pred taper, antibiotics, NEBS, Advair/Spiriva, etc;  He says he has quit smoking... ~  [facility]R- COPD, bullous dis, incr markings at bases, NAD; Cultures of blood/ sput/ urine were all neg; Serologies for strep & legionella were neg; Viral panel was neg; He was treated w/ Oxygen, IV Solumedrol, Rocephin/ Zithromax, NEBS, Lovenox, flutter, etc; He was disch on  Home O2- 2L/min activ & 1L/min rest, Pred taper, NEBS, Levaquin, Advair250/ Spiriva => improved. 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   HE HAS ESTABLISHED PRIMARY CARE w/ DWilburt [name] [facility]w/ [name] et al >>   HYPERTENSION (ICD-401.9) - controlled on METOPROLOL 238mid,  NORVASC 1058m, LISINOPRIL 66m33m..   ~  11/11:  BP=122/70, not checking BP's at home... denies HA, fatigue, visual changes, CP, palipit, dizziness, syncope, dyspnea, edema, etc. ~  5/12:  BP= 120/72, pt very pleased w/ his wt loss & improved exercise ability. ~  6/13:  BP= 122/64 & feeling well w/o CP, palpit, SOB, edema, etc... ~  12/13:  BP=122/82 & he denies CP, palpit, SOB, edema... ~  7/14: on Metop25-3/d, Amlod10, Lisin40; BP=130/78 & he denies CP, palpit, SOB, edema ~  1/15: BP remains stable on Metop25-2AM & 1PM, Amlod5, Lisin40; BP= 120/72 and he denies CP, palpit, ch in SOB, edema. ~  2016> His meds have been adjusted- now on Rx w/ Amlod10, Lisin10; BP= 130/72 7 he denies CP, palpit, edema; chr SOB/DOE due to his COPD.  CORONARY ARTERY DISEASE (ICD-414.00) -  ASA 3m/d & off Plavix as of 2/13 officially as he wasn't taking it regularly anyway. ~  Hx prev IWMI and PTCA/ stent in RCA by DrBrodie 12/95 & 5/96...  ~  Myoview 4/08 abnormal w/ inferior ischemia and subseq cath revealing restenoses in RCA- s/p repeat PTCA/ stents...  ~  f/u Myoview 9/08 w/ prev infer infarct, no ischemia, EF=56%... ~  9/11:  pre op cardiac eval DrBrodie> OK to hold ASA/ Plavix for bladder surg. ~  2/12: f/u [name] w/ hx CAD, MI in 95, mult PCIs last 2009> stable, on ASA/ Plavix, no changes made; EKG= NSR, WNL... ~  2/13:  He had f/u  [name] w/ Myoview showing a fixed defect in infer wall, no ischemia, EF=58%; pt wasn't taking Plavix, therefore stopped in favor of ASA839md... ~  3/14:  He had f/u [name]> CAD, IWMI 1995, mult PCIs, last Myoview 2013 was low risk; too sedentary, no changes made; 7377/o brother died recently w/ AAA rupture... ~  EKG 3/14 showed NSR, rate65, WNL, NAD... ~  He maintains f/u w/ CARDS, [name]- known CAD, Hx inferior MI 1995, s/p mult PCIs- most recent 2009; finally qit smoking 12/16 [facility]/ hypox[facility]; on ASA, statin, Amlod & Lisin.  HYPERCHOLESTEROLEMIA (Mixed Hyperlipidemia) - prev on Simva80 but DrBrodie decr him to SIMVASTATIN 201m + Fish Oil & Red Wine qd... ~  FLPLoami08 on Simva40 showed TChol 151, TG 106, HDL 23, LDL 107 ~  FLP 8/09 on Simva80 showed TChol 142, TG 135, HDL 28, LDL 87 ~  FLP 6/10 on Simva80 showed TChol 151, TG 117, HDL 31, LDL 97 ~  FLP in hosp 10/10 showed TChol 128, TG 110, HDL 26, LDL 80 ~  FLP 5/11 on Simva80 showed TChol 144, TG 215, HDL 26, LDL 89 ~  9/11:  DrBrodie decr him to Simva20... we discussed the need for better diet & wt reduction. ~  FLP 5/12 on Simva20 showed TChol 162, TG 159, HDL 32, LDL 98 ~  FLP 6/13 on Simva20 showed TChol 130, TG 102, HDL 33, LDL 77... rec to incr exercise program & get wt down! ~  FLP 7/14 on Simva20 showed TChol 158, TG 145, HDL 33, LDL 96... He has gained way too much wt 7 must get on diet! ~  Followed by PCP- [name]...  DIABETES MELLITUS, BORDERLINE (ICD-790.29) - on diet alone... ~  labs 9/08 showed BS=100, HgA1c=6.1 ~  labs 8/09 showed BS= 107, A1c= 6.2 ~  labs 6/10 showed BS= 108, A1c= 6.2 ~  lab 5/11 (wt=244#) showed BS= 121. A1c= 6.7... Marland Kitchene was told "get wt down, or start meds". ~  Labs 5/12 showed BS= 109, A1c= 6.1 ~  Labs 6/13 showed BS= 114, & he needs to lose the weight... ~  Labs 7/14 on diet alone showed BS= 120, A1c= 6.7 ~  Followed by PCP- [name]...  OBESITY (ICD-278.00) - weight  was down as low as 202# in 2/09, and steadily incr to 244# by 5/11... ~  weight 5/11 = 244#... Marland Kitchenwe reviewed diet + exercise necessary to lose weight... ~  weight 11/11 = 217# (after dx & rx for bladder cancer) ~  Weight 5/12 = 214# but says it's 192# at home... ~  Weight 6/13 = 211# but knows it's better since his pant/ belt is down to 36" (under his roll). ~  Weight 7/14 = 230# but he has quit smoking he says, now must lose the weight... ~  Weight  1/15 = 229# ~  Weight 1/17 = 216# ~  Weight 3/17 = 231# and we reviewed diet, exercise, wt reducing strategies...  GASTRITIS (ICD-535.50) - had EGD 10/05 showing gastritis... on RANITADINE 337m/d due to his Plavix Rx, off prev Omep.  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 ~  4/12:  Pt reported f/u cysto was neg, doing well... ~  10/12:  Note from [name] reviewed> cysto was neg, no recurrent tumors... ~  4/13:  Note from [name] reviewed> neg f/u cysto w/o recurrent TCCa... He wants to continue Q6mo43motoscopies... ~  1/14:  He had f/u w/ [name]> Hx TCCa bladder, s/p TURBT 9/11, given BCG 11/11-12/11 and f/u Cysto 1/12 was neg; repeat cysto 1/14 was neg as well...  DEGENERATIVE JOINT DISEASE (ICD-715.90) - He has c/o right shoulder pain, hip pain, knee pain, and LBP> Rx ADVIL w/ some benefit... offered Ortho referral & he will decide.  ANXIETY (ICD-300.00) - on ALPRAZOLAM 0.5mg 62mn...   Past Surgical History:  Procedure Laterality Date  . cataract surgery  septemeber 2013  . cataract surgery/sub posterior vitrectomy in left eye  1996   Dr. RankiZadie RhineTCA  1995,[phone] Encounter Medications as of 10/26/2018  Medication Sig  . acetaminophen (TYLENOL) 325 MG tablet Take 2 tablets (650 mg total) by mouth every 6 (six) hours as needed for mild pain (or Fever >/= 101).  . Albuterol Sulfate (PROAIR RESPICLICK) 108 (564Base) MCG/ACT AEPB Inhale 2 puffs into the lungs every 6 (six) hours as needed (shortness of breath).  . ALPRAZolam (XANAX) 0.5 MG tablet Take 1 tablet (0.5 mg total) by mouth at bedtime.  . budesonide (PULMICORT) 0.25 MG/2ML nebulizer solution Take 2 mLs (0.25 mg total) by nebulization 2 (two) times daily.  . cholecalciferol (VITAMIN D) 400 units TABS tablet Take 400 Units by mouth at bedtime.  . Ferrous Sulfate (IRON) 325 (65 Fe) MG TABS Take 325 mg by mouth daily.  . furosemide (LASIX) 40 MG tablet Take 40 mg by mouth daily.   . guaMarland KitchenFENesin (MUCINEX) 600 MG 12 hr tablet Take 2 tablets (1,200 mg total) by mouth 2 (two) times daily.  . iprMarland Kitchentropium-albuterol (DUONEB) 0.5-2.5 (3) MG/3ML SOLN Take 3 mLs by nebulization 4 (four) times daily.  . isosorbide mononitrate (IMDUR) 30 MG 24 hr tablet Take 30 mg by mouth daily.  . lisMarland Kitchennopril (PRINIVIL,ZESTRIL) 10 MG tablet Take 10 mg by mouth daily.  . methocarbamol (ROBAXIN) 500 MG tablet TAKE 1 TO 2 TABLETS BY MOUTH TWICE A DAY AS NEEDED FOR MUSCLE CRAMPS  . multivitamin-lutein (OCUVITE-LUTEIN) CAPS capsule Take 1 capsule by mouth every evening.   . nitroGLYCERIN (NITROSTAT) 0.4 MG SL tablet Place 1 tablet (0.4 mg total) under the tongue every 5 (five) minutes as needed for chest pain.  . PolVladimir Fasterol-Propyl Glycol (SYSTANE OP) Apply 1 drop to eye daily as  needed (dry eyes).  . simvastatin (ZOCOR) 20 MG tablet Take 1 tablet (20 mg total) by mouth daily at 6 PM.  . sodium chloride (OCEAN) 0.65  % SOLN nasal spray Place 1 spray into both nostrils as needed for congestion.  . vitamin B-12 (CYANOCOBALAMIN) 1000 MCG tablet Take 1,000 mcg by mouth daily.  . [DISCONTINUED] Albuterol Sulfate (PROAIR RESPICLICK) 421 (90 Base) MCG/ACT AEPB Inhale 2 puffs into the lungs every 6 (six) hours as needed (shortness of breath).  . [DISCONTINUED] ALPRAZolam (XANAX) 0.5 MG tablet Take 0.5 mg by mouth at bedtime.   . pantoprazole (PROTONIX) 40 MG tablet Take 1 tablet (40 mg total) by mouth daily.   No facility-administered encounter medications on file as of 10/26/2018.     Allergies  Allergen Reactions  . Amlodipine Swelling    Immunization History  Administered Date(s) Administered  . Influenza Split 09/13/2011, 09/21/2012, 09/04/2013  . Influenza Whole 08/15/2009, 08/03/2010  . Influenza, High Dose Seasonal PF 09/09/2017, 07/16/2018  . Influenza,inj,Quad PF,6+ Mos 09/04/2014, 07/20/2016  . Influenza-Unspecified 08/31/2015  . Pneumococcal Conjugate-13 08/31/2015  . Pneumococcal Polysaccharide-23 11/15/1998    Current Medications, Allergies, Past [name] History, Past Surgical History, Family History, and Social History were reviewed in [employer] record.    Review of Systems        See HPI - all other systems neg except as noted... The patient complains of some dyspnea on exertion.  The patient denies anorexia, fever, weight loss, weight gain, vision loss, decreased hearing, hoarseness, chest pain, syncope, peripheral edema, prolonged cough, headaches, hemoptysis, abdominal pain, melena, hematochezia, severe indigestion/heartburn, hematuria, incontinence, muscle weakness, suspicious skin lesions, transient blindness, difficulty walking, depression, unusual weight change, abnormal bleeding, enlarged lymph nodes, and angioedema.     Objective:   Physical Exam    WD, Obese, 73 y/o WM in NAD... Vital Signs:  Reviewed... GENERAL:  Alert & oriented; pleasant &  cooperative... HEENT:  [place]/AT, EOM-wnl, PERRLA, EACs-clear, TMs-wnl, NOSE- sl congestion, THROAT-clear & wnl. NECK:  Supple w/ fairROM; no JVD; normal carotid impulses w/o bruits; no thyromegaly or nodules palpated; no lymphadenopathy. CHEST:  decr BS at bases bilat w/ few scat rhonchi, no wheezing/ rales/ or signs of consolidation... HEART:  Regular Rhythm; without murmurs/ rubs/ or gallops detected... ABDOMEN:  Obese, soft, & nontender; sm knot palpated in mid abd above umbilicus; normal bowel sounds; no organomegaly detected. EXT: without deformities, mild arthritic changes; no varicose veins/ +venous insuffic/ no edema. NEURO:  CN's intact; no focal neuro deficits... DERM:  No lesions noted; no rash etc...  RADIOLOGY DATA:  Reviewed in the EPIC EMR & discussed w/ the patient...  LABORATORY DATA:  Reviewed in the EPIC EMR & discussed w/ the patient...   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  11/21/15>  referred for PulmRehab, contact Levan re- POC, he still needs Full PFTs & CT Chest... We plan ROV in 6 wks 02/02/16> We discussed continuing the NEBS Tid followed by Advair250Bid & Spiriva daily; add Mucinex 1212mBid & we will Rx w/ Levaquin500/d x7d for his bronchitic infection (see AVS);  He still needs a CT Chest but wants to wait for now;  Needs to get into the [facility] program ASAP, and he wants diff DME supplier- we will inquire, plan ROV 110mo6/21/17>   [place] so we switched to NEBS w/ Duoneb Qid & Budes Bid; added ProairHFA prn; he desperately needs to lose wt in conjunction w/ his  PulmRehab etc... 08/05/16>   He presents w/ a COPD exac-- Rx w/ Levaquin, Pred20-4d taper + NEBS w/ duoneb Qid & budes Bid...  11/24/16>   EJaquelyn Bitterhas severe COPD/emphysema w/ pulmHTN & CorPulmonale; he refuses to fill inhalers due to costs & he is therefore on O2- 24/7, BiPAP nightly, NEBS w/ Duoneb Qid & Budes Bid, Mucinex 1200Bid, & asked to get into a regular exercise program;  He will continue regular f/u w/ CARDS- [name]; must work on weight reduction... 01/27/17>   EFlayhas severe end-stage COPD- mixed chr bronchitis & emphysema w/ chr hypoxemia & hypercarbic resp failure, w/ Hx pulmHTN & right heart failure; s/p Hosp & now improved and approaching his baseline on max meds=> O2 at 2-4L/min 24/7 (he has [employer] that supplies 2L/min continuous or up to 6L/min pulse dose flow), nocturnal BiPAP, NEBs w/ Duoneb Qid & Budes 0.25Bid, Mucinex 12028mid, weaning oral Prednisone from his recent hosp, finished oral Levaquin; on ASA81, Lasix40Bid, Avapro75, Imdur30, K20, Zocor20, and off prev Metoprolol... We discussed continuing the Pred taper til gone, continue other meds the same for now & ROV recheck 43m70mo Note: he has been followed for Cards by [name] (HBP, CAD w/ IWMI 1995 & mult PCIs, HL) and last seen in 201`5 but he saw ScoRichardson Dopp in 5-04/2016... 06/07/17>   ErnOzzies been diagnosed w/ a large sarcoma (spindle cell tumor) in his abd- [facility] He has been evaluated by Pulm- DrPascual who felt that he was high risk for surgical pulm complications, they rec that he continue same pulm meds;  Currently we are awaiting f/u CT Abd after the XRT & decision regarding operability per the surgical team... 10/10/17>   I would like to see a BiPAP download, continue current meds the same & [facility] will recheck pt in 59mo60mooner if needed prn...  01/10/18>   He is at baseline from the pulm standpoint- continue current Opxygen, BiPAP Qhs, Duoneb Qid &  Budes Bid;  On-going [facility] of [facility]abdominal sarcoma [name] 04/12[facility]>   [name] has on-going issues w/ the intra-abd sarcoma [facility] ([facility] was anemic & required Tx raising the concern for bleeding- DrHoMorro Bayology are aware;  Continue same pulmonary Rx. 07/13/18>   [name] has been thru it- known large intra-abd sarcoma, mets to liver, adrenal,?lung and s/p IR ablation therapy to liver & adrenal met; then active GI bleed July=>Aug as above; now c/o sinus infection & COPD exac for which we will treat w/ Levaquin & Pred course... we plan rov recheck in 8mo.38mo30/19>   [name] is stable & [name]/ f/u by oncology;  He is followed closely by his PCP- [facility]HMiss[facility] Center[facility]ly found to be anemic & give 2u packed cells (we do not have notes or labs to review;  We discussed my up-coming retirement & will set him up to see DrIcard in 2-59mo..18moMarland KitchenMarland Kitchen  10/26/18>   [name]'s breathing is stable s/p Hosp at [facility] w/ antibiotics, Pred taper, and 9L diuresis;  He is asked to continue NEBS w/ Duoneb Qid/ Budes Bid, Mucinex600-2Bid, Lasix40Bid;  He is sched for further IR treatment of liver metastatic dis from his large intra-abd spindle cell sarcoma; IR- DrDowning, Oncology- DrRaj... we discussed my upcoming retirement and he requests continued pulm follow up in [place]- we will arrange for an appt w/ one of my young partners after his planned additional liver ablation procedure   Primary Care followed by [name]>> HBP>   CAD>            Followed by [name] & [name] LIPIDS>   DM>   GI> Hx Gastritis, Polyps>   Dx w/ LARGE SARCOMA in Abd => eval & rx by [facility]... GU>   Followed by [name]   Patient's Medications  New Prescriptions   No medications on file  Previous Medications   ACETAMINOPHEN (TYLENOL) 325 MG TABLET    Take 2 tablets (650 mg total) by mouth every 6 (six) hours as  needed for mild pain (or Fever >/= 101).   BUDESONIDE (PULMICORT) 0.25 MG/2ML NEBULIZER SOLUTION    Take 2 mLs (0.25 mg total) by nebulization 2 (two) times daily.   CHOLECALCIFEROL (VITAMIN D) 400 UNITS TABS TABLET    Take 400 Units by mouth at bedtime.   FERROUS SULFATE (IRON) 325 (65 FE) MG TABS    Take 325 mg by mouth daily.   FUROSEMIDE (LASIX) 40 MG TABLET    Take 40 mg by mouth daily.    GUAIFENESIN (MUCINEX) 600 MG 12 HR TABLET    Take 2 tablets (1,200 mg total) by mouth 2 (two) times daily.   IPRATROPIUM-ALBUTEROL (DUONEB) 0.5-2.5 (3) MG/3ML SOLN    Take 3 mLs by nebulization 4 (four) times daily.   ISOSORBIDE MONONITRATE (IMDUR) 30 MG 24 HR TABLET    Take 30 mg by mouth daily.   LISINOPRIL (PRINIVIL,ZESTRIL) 10 MG TABLET    Take 10 mg by mouth daily.   METHOCARBAMOL (ROBAXIN) 500 MG TABLET    TAKE 1 TO 2 TABLETS BY MOUTH TWICE A DAY AS NEEDED FOR MUSCLE CRAMPS   MULTIVITAMIN-LUTEIN (OCUVITE-LUTEIN) CAPS CAPSULE    Take 1 capsule by mouth every evening.    NITROGLYCERIN (NITROSTAT) 0.4 MG SL TABLET    Place 1 tablet (0.4 mg total) under the tongue every 5 (five) minutes as needed for chest pain.   PANTOPRAZOLE (PROTONIX) 40 MG TABLET    Take 1 tablet (40 mg total) by mouth daily.   POLYETHYL GLYCOL-PROPYL GLYCOL (SYSTANE OP)    Apply 1 drop to eye daily as needed (dry eyes).   SIMVASTATIN (ZOCOR) 20 MG TABLET    Take 1 tablet (20 mg total) by mouth daily at 6 PM.   SODIUM CHLORIDE (OCEAN) 0.65 % SOLN NASAL SPRAY    Place 1 spray into both nostrils as needed for congestion.   VITAMIN B-12 (CYANOCOBALAMIN) 1000 MCG TABLET    Take 1,000 mcg by mouth daily.  Modified Medications   Modified Medication Previous Medication   ALBUTEROL SULFATE (PROAIR RESPICLICK) 211 (90 BASE) MCG/ACT AEPB Albuterol Sulfate (PROAIR RESPICLICK) 941 (90 Base) MCG/ACT AEPB      Inhale 2 puffs into the lungs every 6 (six) hours as needed (shortness of breath).    Inhale 2 puffs into the lungs every 6 (six) hours as  needed (shortness of breath).   ALPRAZOLAM (XANAX) 0.5 MG TABLET ALPRAZolam (XANAX) 0.5  MG tablet      Take 1 tablet (0.5 mg total) by mouth at bedtime.    Take 0.5 mg by mouth at bedtime.   Discontinued Medications   No medications on file  NOTE>> Advair & Spiriva discontinued due to cost & replaced by DUONEB-Qid & PULMICORT 0,4mBid via NEBULIZER..Marland KitchenMarland Kitchen

## 2018-12-14 ENCOUNTER — Encounter (HOSPITAL_COMMUNITY): Payer: Self-pay

## 2018-12-14 ENCOUNTER — Other Ambulatory Visit: Payer: Self-pay

## 2018-12-14 ENCOUNTER — Observation Stay (HOSPITAL_COMMUNITY): Payer: Medicare Other

## 2018-12-14 ENCOUNTER — Observation Stay (HOSPITAL_COMMUNITY)
Admission: EM | Admit: 2018-12-14 | Discharge: 2018-12-15 | Disposition: A | Discharge status: Home / Self Care | Payer: Medicare Other | Attending: Internal Medicine | Admitting: Internal Medicine

## 2018-12-14 DIAGNOSIS — Z888 Allergy status to other drugs, medicaments and biological substances status: Secondary | ICD-10-CM | POA: Insufficient documentation

## 2018-12-14 DIAGNOSIS — J9621 Acute and chronic respiratory failure with hypoxia: Secondary | ICD-10-CM | POA: Diagnosis present

## 2018-12-14 DIAGNOSIS — C7972 Secondary malignant neoplasm of left adrenal gland: Secondary | ICD-10-CM | POA: Diagnosis not present

## 2018-12-14 DIAGNOSIS — J449 Chronic obstructive pulmonary disease, unspecified: Secondary | ICD-10-CM | POA: Diagnosis present

## 2018-12-14 DIAGNOSIS — I10 Essential (primary) hypertension: Secondary | ICD-10-CM | POA: Diagnosis present

## 2018-12-14 DIAGNOSIS — Z923 Personal history of irradiation: Secondary | ICD-10-CM | POA: Diagnosis not present

## 2018-12-14 DIAGNOSIS — E669 Obesity, unspecified: Secondary | ICD-10-CM | POA: Diagnosis not present

## 2018-12-14 DIAGNOSIS — D62 Acute posthemorrhagic anemia: Principal | ICD-10-CM | POA: Diagnosis present

## 2018-12-14 DIAGNOSIS — C787 Secondary malignant neoplasm of liver and intrahepatic bile duct: Secondary | ICD-10-CM | POA: Diagnosis not present

## 2018-12-14 DIAGNOSIS — I420 Dilated cardiomyopathy: Secondary | ICD-10-CM | POA: Diagnosis present

## 2018-12-14 DIAGNOSIS — I272 Pulmonary hypertension, unspecified: Secondary | ICD-10-CM | POA: Diagnosis present

## 2018-12-14 DIAGNOSIS — Z9981 Dependence on supplemental oxygen: Secondary | ICD-10-CM | POA: Diagnosis not present

## 2018-12-14 DIAGNOSIS — F419 Anxiety disorder, unspecified: Secondary | ICD-10-CM | POA: Diagnosis present

## 2018-12-14 DIAGNOSIS — Z7951 Long term (current) use of inhaled steroids: Secondary | ICD-10-CM | POA: Diagnosis not present

## 2018-12-14 DIAGNOSIS — J9622 Acute and chronic respiratory failure with hypercapnia: Secondary | ICD-10-CM | POA: Insufficient documentation

## 2018-12-14 DIAGNOSIS — E7800 Pure hypercholesterolemia, unspecified: Secondary | ICD-10-CM | POA: Diagnosis present

## 2018-12-14 DIAGNOSIS — Z79899 Other long term (current) drug therapy: Secondary | ICD-10-CM | POA: Insufficient documentation

## 2018-12-14 DIAGNOSIS — Z66 Do not resuscitate: Secondary | ICD-10-CM | POA: Insufficient documentation

## 2018-12-14 DIAGNOSIS — Z6835 Body mass index (BMI) 35.0-35.9, adult: Secondary | ICD-10-CM | POA: Diagnosis not present

## 2018-12-14 DIAGNOSIS — Z8551 Personal history of malignant neoplasm of bladder: Secondary | ICD-10-CM | POA: Insufficient documentation

## 2018-12-14 DIAGNOSIS — Z8249 Family history of ischemic heart disease and other diseases of the circulatory system: Secondary | ICD-10-CM | POA: Diagnosis not present

## 2018-12-14 DIAGNOSIS — R002 Palpitations: Secondary | ICD-10-CM | POA: Diagnosis not present

## 2018-12-14 DIAGNOSIS — Z87891 Personal history of nicotine dependence: Secondary | ICD-10-CM | POA: Diagnosis not present

## 2018-12-14 DIAGNOSIS — Z809 Family history of malignant neoplasm, unspecified: Secondary | ICD-10-CM | POA: Diagnosis not present

## 2018-12-14 DIAGNOSIS — I2510 Atherosclerotic heart disease of native coronary artery without angina pectoris: Secondary | ICD-10-CM | POA: Insufficient documentation

## 2018-12-14 DIAGNOSIS — C762 Malignant neoplasm of abdomen: Secondary | ICD-10-CM | POA: Diagnosis not present

## 2018-12-14 DIAGNOSIS — R531 Weakness: Secondary | ICD-10-CM | POA: Diagnosis present

## 2018-12-14 DIAGNOSIS — D649 Anemia, unspecified: Secondary | ICD-10-CM | POA: Diagnosis not present

## 2018-12-14 DIAGNOSIS — C499 Malignant neoplasm of connective and soft tissue, unspecified: Secondary | ICD-10-CM | POA: Diagnosis not present

## 2018-12-14 DIAGNOSIS — E119 Type 2 diabetes mellitus without complications: Secondary | ICD-10-CM | POA: Diagnosis not present

## 2018-12-14 DIAGNOSIS — C228 Malignant neoplasm of liver, primary, unspecified as to type: Secondary | ICD-10-CM | POA: Diagnosis not present

## 2018-12-14 DIAGNOSIS — C801 Malignant (primary) neoplasm, unspecified: Secondary | ICD-10-CM | POA: Diagnosis not present

## 2018-12-14 DIAGNOSIS — R0602 Shortness of breath: Secondary | ICD-10-CM | POA: Diagnosis not present

## 2018-12-14 LAB — COMPREHENSIVE METABOLIC PANEL
ALT: 36 U/L (ref 0–44)
AST: 40 U/L (ref 15–41)
Albumin: 3.8 g/dL (ref 3.5–5.0)
Alkaline Phosphatase: 48 U/L (ref 38–126)
Anion gap: 10 (ref 5–15)
BUN: 12 mg/dL (ref 8–23)
CO2: 21 mmol/L — ABNORMAL LOW (ref 22–32)
Calcium: 8.4 mg/dL — ABNORMAL LOW (ref 8.9–10.3)
Chloride: 103 mmol/L (ref 98–111)
Creatinine, Ser: 0.89 mg/dL (ref 0.61–1.24)
GFR calc Af Amer: >60 mL/min (ref >60)
GFR calc non Af Amer: >60 mL/min (ref >60)
GLUCOSE: 136 mg/dL — AB (ref 70–99)
Potassium: 3.9 mmol/L (ref 3.5–5.1)
Sodium: 134 mmol/L — ABNORMAL LOW (ref 135–145)
Total Bilirubin: 2.2 mg/dL — ABNORMAL HIGH (ref 0.3–1.2)
Total Protein: 6.8 g/dL (ref 6.5–8.1)

## 2018-12-14 LAB — POC OCCULT BLOOD, ED: Fecal Occult Bld: NEGATIVE

## 2018-12-14 LAB — CBC
HCT: 20.3 % — ABNORMAL LOW (ref 39.0–52.0)
Hemoglobin: 6.1 g/dL — CL (ref 13.0–17.0)
MCH: 31.6 pg (ref 26.0–34.0)
MCHC: 30 g/dL (ref 30.0–36.0)
MCV: 105.2 fL — ABNORMAL HIGH (ref 80.0–100.0)
Platelets: 191 10*3/uL (ref 150–400)
RBC: 1.93 MIL/uL — ABNORMAL LOW (ref 4.22–5.81)
RDW: 23.5 % — ABNORMAL HIGH (ref 11.5–15.5)
WBC: 4.7 10*3/uL (ref 4.0–10.5)
nRBC: 1.3 % — ABNORMAL HIGH (ref 0.0–0.2)

## 2018-12-14 LAB — PREPARE RBC (CROSSMATCH)

## 2018-12-14 MED ORDER — POLYETHYL GLYCOL-PROPYL GLYCOL 0.4-0.3 % OP GEL: Freq: Every day | OPHTHALMIC | Status: DC | PRN

## 2018-12-14 MED ORDER — PROSIGHT PO TABS
1.0000 | ORAL_TABLET | Freq: Two times a day (BID) | ORAL | Status: DC
  Administered 2018-12-14 – 2018-12-15 (×2): 1 via ORAL
  Filled 2018-12-14 (×2): qty 1

## 2018-12-14 MED ORDER — VITAMIN C 500 MG PO TABS
1000.0000 mg | ORAL_TABLET | Freq: Every day | ORAL | Status: DC
  Administered 2018-12-15: 1000 mg via ORAL
  Filled 2018-12-14: qty 2

## 2018-12-14 MED ORDER — ACETAMINOPHEN 650 MG RE SUPP: 650.0000 mg | Freq: Four times a day (QID) | RECTAL | Status: DC | PRN

## 2018-12-14 MED ORDER — METHOCARBAMOL 500 MG PO TABS
500.0000 mg | ORAL_TABLET | Freq: Two times a day (BID) | ORAL | Status: DC | PRN
  Administered 2018-12-14: 1000 mg via ORAL
  Filled 2018-12-14: qty 2

## 2018-12-14 MED ORDER — GUAIFENESIN ER 600 MG PO TB12
1200.0000 mg | ORAL_TABLET | Freq: Two times a day (BID) | ORAL | Status: DC
  Administered 2018-12-14 – 2018-12-15 (×2): 1200 mg via ORAL
  Filled 2018-12-14 (×2): qty 2

## 2018-12-14 MED ORDER — IPRATROPIUM-ALBUTEROL 0.5-2.5 (3) MG/3ML IN SOLN
3.0000 mL | Freq: Four times a day (QID) | RESPIRATORY_TRACT | Status: DC
  Administered 2018-12-14: 3 mL via RESPIRATORY_TRACT
  Filled 2018-12-14: qty 3

## 2018-12-14 MED ORDER — IPRATROPIUM-ALBUTEROL 0.5-2.5 (3) MG/3ML IN SOLN
3.0000 mL | Freq: Once | RESPIRATORY_TRACT | Status: AC
  Administered 2018-12-14: 3 mL via RESPIRATORY_TRACT
  Filled 2018-12-14: qty 3

## 2018-12-14 MED ORDER — IRON 325 (65 FE) MG PO TABS: 325.0000 mg | ORAL_TABLET | Freq: Every day | ORAL | Status: DC

## 2018-12-14 MED ORDER — FUROSEMIDE 10 MG/ML IJ SOLN
20.0000 mg | Freq: Once | INTRAMUSCULAR | Status: AC
  Administered 2018-12-14: 20 mg via INTRAVENOUS
  Filled 2018-12-14: qty 2

## 2018-12-14 MED ORDER — FUROSEMIDE 40 MG PO TABS
40.0000 mg | ORAL_TABLET | Freq: Every day | ORAL | Status: DC
  Administered 2018-12-15: 40 mg via ORAL
  Filled 2018-12-14: qty 1

## 2018-12-14 MED ORDER — SODIUM CHLORIDE 0.9 % IV SOLN: 250.0000 mL | INTRAVENOUS | Status: DC | PRN

## 2018-12-14 MED ORDER — SODIUM CHLORIDE 0.9% FLUSH
3.0000 mL | Freq: Two times a day (BID) | INTRAVENOUS | Status: DC
  Administered 2018-12-15: 3 mL via INTRAVENOUS

## 2018-12-14 MED ORDER — ACETAMINOPHEN 325 MG PO TABS: 650.0000 mg | ORAL_TABLET | Freq: Four times a day (QID) | ORAL | Status: DC | PRN

## 2018-12-14 MED ORDER — ALPRAZOLAM 0.5 MG PO TABS
0.5000 mg | ORAL_TABLET | Freq: Every day | ORAL | Status: DC
  Administered 2018-12-14: 0.5 mg via ORAL
  Filled 2018-12-14: qty 1

## 2018-12-14 MED ORDER — VITAMIN B-12 1000 MCG PO TABS
1000.0000 ug | ORAL_TABLET | Freq: Every day | ORAL | Status: DC
  Administered 2018-12-15: 1000 ug via ORAL
  Filled 2018-12-14: qty 1

## 2018-12-14 MED ORDER — SIMVASTATIN 20 MG PO TABS
20.0000 mg | ORAL_TABLET | Freq: Every day | ORAL | Status: DC
  Administered 2018-12-15: 20 mg via ORAL
  Filled 2018-12-14: qty 1

## 2018-12-14 MED ORDER — ONDANSETRON HCL 4 MG/2ML IJ SOLN: 4.0000 mg | Freq: Four times a day (QID) | INTRAMUSCULAR | Status: DC | PRN

## 2018-12-14 MED ORDER — CHOLECALCIFEROL 10 MCG (400 UNIT) PO TABS
400.0000 [IU] | ORAL_TABLET | Freq: Every day | ORAL | Status: DC
  Filled 2018-12-14 (×2): qty 1

## 2018-12-14 MED ORDER — SODIUM CHLORIDE 0.9 % IV SOLN: 10.0000 mL/h | Freq: Once | INTRAVENOUS | Status: DC

## 2018-12-14 MED ORDER — ONDANSETRON HCL 4 MG PO TABS: 4.0000 mg | ORAL_TABLET | Freq: Four times a day (QID) | ORAL | Status: DC | PRN

## 2018-12-14 MED ORDER — SODIUM CHLORIDE 0.9% FLUSH: 3.0000 mL | INTRAVENOUS | Status: DC | PRN

## 2018-12-14 MED ORDER — NITROGLYCERIN 0.4 MG SL SUBL: 0.4000 mg | SUBLINGUAL_TABLET | SUBLINGUAL | Status: DC | PRN

## 2018-12-14 MED ORDER — FLUTICASONE PROPIONATE 50 MCG/ACT NA SUSP: 2.0000 | Freq: Every day | NASAL | Status: DC | PRN

## 2018-12-14 MED ORDER — ALBUTEROL SULFATE (2.5 MG/3ML) 0.083% IN NEBU: 2.5000 mg | INHALATION_SOLUTION | Freq: Four times a day (QID) | RESPIRATORY_TRACT | Status: DC | PRN

## 2018-12-14 MED ORDER — FERROUS SULFATE 325 (65 FE) MG PO TABS
325.0000 mg | ORAL_TABLET | Freq: Every day | ORAL | Status: DC
  Administered 2018-12-15: 325 mg via ORAL
  Filled 2018-12-14: qty 1

## 2018-12-14 MED ORDER — DOCUSATE SODIUM 100 MG PO CAPS
100.0000 mg | ORAL_CAPSULE | Freq: Two times a day (BID) | ORAL | Status: DC
  Filled 2018-12-14 (×2): qty 1

## 2018-12-14 MED ORDER — ISOSORBIDE MONONITRATE ER 30 MG PO TB24
30.0000 mg | ORAL_TABLET | Freq: Every day | ORAL | Status: DC
  Administered 2018-12-15: 30 mg via ORAL
  Filled 2018-12-14: qty 1

## 2018-12-14 MED ORDER — BUDESONIDE 0.25 MG/2ML IN SUSP
0.2500 mg | Freq: Two times a day (BID) | RESPIRATORY_TRACT | Status: DC
  Administered 2018-12-14: 0.25 mg via RESPIRATORY_TRACT
  Filled 2018-12-14: qty 2

## 2018-12-14 MED ORDER — PANTOPRAZOLE SODIUM 40 MG PO TBEC
40.0000 mg | DELAYED_RELEASE_TABLET | Freq: Every day | ORAL | Status: DC
  Administered 2018-12-15: 40 mg via ORAL
  Filled 2018-12-14 (×2): qty 1

## 2018-12-14 NOTE — ED Notes (Signed)
Per PA at [facility], states history of cancer-states Hgb 5.7-no over signs of bleed

## 2018-12-14 NOTE — ED Notes (Signed)
Report called to [facility], Therapist, sports. Will transport shortly.

## 2018-12-14 NOTE — H&P (Signed)
History and Physical  CHANCELOR [name] EYC:[id number] DOB: 1946/10/10 DOA: 12/14/2018  PCP: Andrew Hatchet, MD Patient coming from: Home   I have personally briefly reviewed patient's old medical records in [place]   Chief Complaint: Weakness.   HPI: [name] is a 73 y.o. 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  His hemoglobin on January 18 was at 8. Of Note  he also on July 2019 was admitted at [facility] with low hemoglobin.  He had a CT abdomen and pelvis with enterography showing evidence of focal active intraluminal a small bowel hemorrhage in the right lower quadrant, IR was consulted for possible embolization of the bleeding but no reported  finding of source for intervention of the bleeding.   During that admission patient receive blood transfusion , hb stabilized  and he was discharged home.   Patient went to see his primary care doctor today for hospital follow-up after recent chemoembolization.  He has been feeling weak and tired.  He can tell when his hemoglobin is low, because he feels palpitation.  He has been having difficulty walking in his house. Denies chest pain, he is complaining of shortness of breath.  No blood in the stool.  Evaluation in the ED; occult blood negative, hemoglobin at this point 1, platelets 191, sodium 134 CO2 21, BUN 12 creatinine 1.89.   Review of Systems: All systems reviewed and apart from history of presenting illness, are negative.  Past Medical History:  Diagnosis Date  . Anxiety   . Bladder cancer ([place]) 2011  . Borderline diabetes mellitus   . CAD (coronary artery disease)   . Cigarette smoker   . COPD (chronic obstructive pulmonary  disease) ([place])   . DJD (degenerative joint disease)   . Emphysema of lung ([place])   . Epistaxis   . History of colonic polyps 2004   hyperplastic   . History of sinusitis   . Hypercholesteremia   . Hypertension   . Obesity    Past Surgical History:  Procedure Laterality Date  . cataract surgery  septemeber 2013  . cataract surgery/sub posterior vitrectomy in left eye  1996   Dr. Zadie Rhine  . [facility]  [phone]   Social History:  reports that he quit smoking about 3 years ago. His smoking use included cigarettes. He has a 90.00 pack-year smoking history. He has never used smokeless tobacco. He reports current alcohol use. He reports that he does not use drugs.   Allergies  Allergen Reactions  . Amlodipine Swelling    Family History  Problem Relation Age of Onset  . Heart disease Mother   . Heart disease Brother   . Cancer Brother   . Aneurysm Brother      Prior to Admission medications   Medication Sig Start Date End Date Taking? Authorizing Provider  Albuterol Sulfate (PROAIR RESPICLICK) 850 (90 Base) MCG/ACT AEPB Inhale 2 puffs into the lungs every 6 (six) hours as needed (shortness of breath). 10/26/18  Yes Noralee Space, MD  ALPRAZolam Duanne Moron) 0.5 MG tablet Take 1 tablet (0.5 mg total) by mouth at bedtime. 10/26/18  Yes Noralee Space, MD  Ascorbic Acid (VITAMIN C) 1000 MG tablet Take 1,000 mg by mouth  daily.   Yes [provider]  budesonide (PULMICORT) 0.25 MG/2ML nebulizer solution Take 2 mLs (0.25 mg total) by nebulization 2 (two) times daily. 09/13/18  Yes Noralee Space, MD  cholecalciferol (VITAMIN D) 400 units TABS tablet Take 400 Units by mouth at bedtime.   Yes [provider]  Ferrous Sulfate (IRON) 325 (65 Fe) MG TABS Take 325 mg by mouth daily.   Yes [provider]  Flaxseed, Linseed, (FLAX SEEDS PO) Take 1 capsule by mouth daily.   Yes [provider]  fluticasone (FLONASE) 50 MCG/ACT nasal spray Place 2 sprays into both  nostrils daily as needed for allergies or rhinitis.  09/21/18  Yes [provider]  furosemide (LASIX) 40 MG tablet Take 40 mg by mouth daily.    Yes [provider]  guaiFENesin (MUCINEX) 600 MG 12 hr tablet Take 2 tablets (1,200 mg total) by mouth 2 (two) times daily. 06/11/18  Yes Mariel Aloe, MD  ipratropium-albuterol (DUONEB) 0.5-2.5 (3) MG/3ML SOLN Take 3 mLs by nebulization 4 (four) times daily. Patient taking differently: Take 3 mLs by nebulization every 4 (four) hours.  09/13/18  Yes Noralee Space, MD  isosorbide mononitrate (IMDUR) 30 MG 24 hr tablet Take 30 mg by mouth daily.   Yes [provider]  lisinopril (PRINIVIL,ZESTRIL) 10 MG tablet Take 10 mg by mouth daily.   Yes [provider]  methocarbamol (ROBAXIN) 500 MG tablet Take 500-1,000 mg by mouth 2 (two) times daily as needed for muscle spasms.  03/07/18  Yes [provider]  multivitamin-lutein (OCUVITE-LUTEIN) CAPS capsule Take 1 capsule by mouth 2 (two) times daily.    Yes [provider]  nitroGLYCERIN (NITROSTAT) 0.4 MG SL tablet Place 1 tablet (0.4 mg total) under the tongue every 5 (five) minutes as needed for chest pain. 11/19/15  Yes Weaver, Scott T, PA-C  pantoprazole (PROTONIX) 40 MG tablet Take 1 tablet (40 mg total) by mouth daily. 05/18/18 12/14/18 Yes Shelly Coss, MD  Polyethyl Glycol-Propyl Glycol (SYSTANE OP) Apply 1 drop to eye daily as needed (dry eyes).   Yes [provider]  simvastatin (ZOCOR) 20 MG tablet Take 1 tablet (20 mg total) by mouth daily at 6 PM. 11/19/15  Yes Weaver, Nicki Reaper T, PA-C  sodium chloride (OCEAN) 0.65 % SOLN nasal spray Place 1 spray into both nostrils as needed for congestion. 10/29/15  Yes Cherene Altes, MD  vitamin B-12 (CYANOCOBALAMIN) 1000 MCG tablet Take 1,000 mcg by mouth daily.   Yes [provider]  acetaminophen (TYLENOL) 325 MG tablet Take 2 tablets (650 mg total) by mouth every 6 (six) hours as needed for  mild pain (or Fever >/= 101). Patient not taking: Reported on 12/14/2018 06/11/18   Mariel Aloe, MD   Physical Exam: Vitals:   12/14/18 1448 12/14/18 1557 12/14/18 1609 12/14/18 1625  BP: 127/62 134/73 130/76 135/68  Pulse: 92  88   Resp: 20  15   Temp:  98.7 F (37.1 C)  98.9 F (37.2 C)  TempSrc:  Oral  Oral  SpO2: 100%  99%   Weight:      Height:         General exam: tachypnea, lips breathing   Head, eyes and ENT: Nontraumatic and normocephalic. Pupils equally reacting to light and accommodation. Oral mucosa moist.  Neck: Supple. No JVD, carotid bruit or thyromegaly.  Lymphatics: No lymphadenopathy.  Respiratory system: Mild tachypnea, no wheezing  Cardiovascular system: S1 and S2 heard,  RRR. No JVD, murmurs, gallops, clicks or pedal edema.  Gastrointestinal system: Abdomen is distended, mass felt in the right side, no rigidity.  Central nervous system: Alert and oriented. No focal neurological deficits.  Extremities: Symmetric 5 x 5 power. Peripheral pulses symmetrically felt.   Skin: No rashes or acute findings.  Musculoskeletal system: Negative exam.  Psychiatry: Pleasant and cooperative.   Labs on Admission:  Basic Metabolic Panel: Recent Labs  Lab 12/14/18 1354  NA 134*  K 3.9  CL 103  CO2 21*  GLUCOSE 136*  BUN 12  CREATININE 0.89  CALCIUM 8.4*   Liver Function Tests: Recent Labs  Lab 12/14/18 1354  AST 40  ALT 36  ALKPHOS 48  BILITOT 2.2*  PROT 6.8  ALBUMIN 3.8   No results for input(s): LIPASE, AMYLASE in the last 168 hours. No results for input(s): AMMONIA in the last 168 hours. CBC: Recent Labs  Lab 12/14/18 1354  WBC 4.7  HGB 6.1*  HCT 20.3*  MCV 105.2*  PLT 191   Cardiac Enzymes: No results for input(s): CKTOTAL, CKMB, CKMBINDEX, TROPONINI in the last 168 hours.  BNP (last 3 results) No results for input(s): PROBNP in the last 8760 hours. CBG: No results for input(s): GLUCAP in the last 168 hours.  Radiological  Exams on Admission: No results found.    Assessment/Plan Active Problems:   HYPERCHOLESTEROLEMIA   Anxiety   Essential hypertension   COPD mixed type (HCC)   Acute on chronic respiratory failure with hypoxia and hypercapnia (HCC)   Dilated cardiomyopathy (HCC)   Pulmonary hypertension (HCC)   Acute blood loss anemia   Liver metastasis (HCC)   Anemia  1-Acute blood loss anemia;  Recent liver embolization liver artery. Prior bleeding from tumor, or small bowel hemorrhage.  Occult blood negative.  Patient will received 2 units PRBC> 20 mg IV lasix in between.  Repeat Hb post transfusion, depending on result needs further transfusion. Goal more than 8.  Will get CT abdomen pelvis rule out bleed due to recent embolization.   2-Chronic hypoxic,hypercapnic respiratory failure.  On 6 L oxygen.  BIPAP HS , ordered.  Continue with nebulizer.   3-Anxiety; continue with xanax.   4-Abdominal spidel cell sarcoma; metastasis diseases; lung and liver;  Needs to follow up with his oncologist. Needs further discussion of goals of care with his oncologist. In the event of recurrent anemia and bleeding.   5-Dilated cardiomyopathy, continue with lasix.      DVT Prophylaxis: SCD due to anemia.  Code Status: DNR Family Communication: Family at bedside.  Disposition Plan: admit for blood transfusion and further evaluation of anemia.   Time spent; 75 minutes.   Elmarie Shiley MD [facility]   If 7PM-7AM, please contact night-coverage [email or web address] Password [email or web address]  12/14/2018, 4:49 PM

## 2018-12-14 NOTE — ED Provider Notes (Signed)
[facility] DEPT Provider Note   CSN: [id number] Arrival date & time: 12/14/18  1320     History   Chief Complaint Chief Complaint  Patient presents with  . Abnormal Lab    Hgb-5.7    HPI [name] is a 73 y.o. male.  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 On palliative care. He has hx of needing multiple transfusions related to lower GI bleed. Not a candidate for colonoscopy/endoscopy due to hgh risk. Patient reports today he woke up and "I knew my levels were low". He reports when his hemoglobin decreases he gets palpitations and increased SOB. He is on home oxygen continuously.  Denies chest pain, n/v/d, BRBPR, or bleeding elsewhere. He is not anticoagulated. CTA and CT of abdomen in July not revealing of any acute bleeding.      Past Medical History:  Diagnosis Date  . Anxiety   . Bladder cancer ([place]) 2011  . Borderline diabetes mellitus   . CAD (coronary artery disease)   . Cigarette smoker   . COPD (chronic obstructive pulmonary disease) ([place])   . DJD (degenerative joint disease)   . Emphysema of lung ([place])   . Epistaxis   . History of colonic polyps 2004   hyperplastic   . History of sinusitis   . Hypercholesteremia   . Hypertension   . Obesity     Patient Active Problem List   Diagnosis Date Noted  . Anemia 12/14/2018  . Liver metastasis ([place]) 07/13/2018  . Acute blood loss anemia 06/07/2018  . Rectal bleeding 05/13/2018  . Chronic diastolic CHF (congestive heart failure) ([place]) 05/13/2018  . Lower GI bleed 05/13/2018  . Class 2 obesity with body mass index (BMI) of 37.0 to 37.9 in adult 01/10/2018  . Spindle cell sarcoma ([place]) 06/07/2017  . Acute on chronic diastolic CHF (congestive heart failure) ([place]) 01/19/2017   . Chronic respiratory failure with hypoxia and hypercapnia ([place]) 10/13/2016  . OSA treated with BiPAP 10/13/2016  . Macrocytic anemia 09/24/2016  . COPD with acute exacerbation ([place]) 09/24/2016  . Acute bronchitis 02/03/2016  . Ex-smoker 10/31/2015  . Dilated cardiomyopathy ([place])   . Pulmonary hypertension ([place])   . Acute on chronic respiratory failure with hypoxia and hypercapnia ([place]) 10/20/2015  . 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 LBP (low back pain) 03/24/2011  . Malignant neoplasm of bladder ([place]) 09/22/2010  . HEMATURIA UNSPECIFIED 05/14/2010  . Osteoarthritis 03/28/2010  . Coronary atherosclerosis of native coronary artery 12/19/2007  . Acute sinusitis 12/19/2007  . COPD mixed type ([place]) 12/19/2007  . COLONIC POLYPS 12/18/2007  . HYPERCHOLESTEROLEMIA 12/18/2007  . Anxiety 12/18/2007  . Essential hypertension 12/18/2007  . GASTRITIS 12/18/2007  . Impaired fasting glucose 12/18/2007    Past Surgical History:  Procedure Laterality Date  . cataract surgery  septemeber 2013  . cataract surgery/sub posterior vitrectomy in left eye  1996   Dr. Zadie Rhine  . PTCA  [phone]        Home Medications    Prior to Admission medications   Medication Sig Start Date End Date Taking? Authorizing Provider  Albuterol Sulfate (PROAIR RESPICLICK) 008 (90 Base) MCG/ACT AEPB Inhale 2 puffs into the lungs every 6 (six) hours as needed (shortness of breath). 10/26/18  Yes Noralee Space, MD  ALPRAZolam (  XANAX) 0.5 MG tablet Take 1 tablet (0.5 mg total) by mouth at bedtime. 10/26/18  Yes Noralee Space, MD  Ascorbic Acid (VITAMIN C) 1000 MG tablet Take 1,000 mg by mouth daily.   Yes [provider]  budesonide (PULMICORT) 0.25 MG/2ML nebulizer solution Take 2 mLs (0.25 mg total) by nebulization 2 (two) times daily. 09/13/18  Yes Noralee Space, MD  cholecalciferol (VITAMIN D) 400 units TABS tablet Take 400 Units by mouth at bedtime.   Yes [provider]    Ferrous Sulfate (IRON) 325 (65 Fe) MG TABS Take 325 mg by mouth daily.   Yes [provider]  Flaxseed, Linseed, (FLAX SEEDS PO) Take 1 capsule by mouth daily.   Yes [provider]  fluticasone (FLONASE) 50 MCG/ACT nasal spray Place 2 sprays into both nostrils daily as needed for allergies or rhinitis.  09/21/18  Yes [provider]  furosemide (LASIX) 40 MG tablet Take 40 mg by mouth daily.    Yes [provider]  guaiFENesin (MUCINEX) 600 MG 12 hr tablet Take 2 tablets (1,200 mg total) by mouth 2 (two) times daily. 06/11/18  Yes Mariel Aloe, MD  ipratropium-albuterol (DUONEB) 0.5-2.5 (3) MG/3ML SOLN Take 3 mLs by nebulization 4 (four) times daily. Patient taking differently: Take 3 mLs by nebulization every 4 (four) hours.  09/13/18  Yes Noralee Space, MD  isosorbide mononitrate (IMDUR) 30 MG 24 hr tablet Take 30 mg by mouth daily.   Yes [provider]  lisinopril (PRINIVIL,ZESTRIL) 10 MG tablet Take 10 mg by mouth daily.   Yes [provider]  methocarbamol (ROBAXIN) 500 MG tablet Take 500-1,000 mg by mouth 2 (two) times daily as needed for muscle spasms.  03/07/18  Yes [provider]  multivitamin-lutein (OCUVITE-LUTEIN) CAPS capsule Take 1 capsule by mouth 2 (two) times daily.    Yes [provider]  nitroGLYCERIN (NITROSTAT) 0.4 MG SL tablet Place 1 tablet (0.4 mg total) under the tongue every 5 (five) minutes as needed for chest pain. 11/19/15  Yes Weaver, Scott T, PA-C  pantoprazole (PROTONIX) 40 MG tablet Take 1 tablet (40 mg total) by mouth daily. 05/18/18 12/14/18 Yes Shelly Coss, MD  Polyethyl Glycol-Propyl Glycol (SYSTANE OP) Apply 1 drop to eye daily as needed (dry eyes).   Yes [provider]  simvastatin (ZOCOR) 20 MG tablet Take 1 tablet (20 mg total) by mouth daily at 6 PM. 11/19/15  Yes Weaver, Nicki Reaper T, PA-C  sodium chloride (OCEAN) 0.65 % SOLN nasal spray Place 1 spray into both nostrils as needed  for congestion. Patient taking differently: Place 1 spray into both nostrils daily as needed for congestion.  10/29/15  Yes Cherene Altes, MD  vitamin B-12 (CYANOCOBALAMIN) 1000 MCG tablet Take 1,000 mcg by mouth daily.   Yes [provider]  acetaminophen (TYLENOL) 325 MG tablet Take 2 tablets (650 mg total) by mouth every 6 (six) hours as needed for mild pain (or Fever >/= 101). Patient not taking: Reported on 12/14/2018 06/11/18   Mariel Aloe, MD    Family History Family History  Problem Relation Age of Onset  . Heart disease Mother   . Heart disease Brother   . Cancer Brother   . Aneurysm Brother     Social History Social History   Tobacco Use  . Smoking status: Former Smoker    Packs/day: 2.00    Years: 45.00    Pack years: 90.00    Types: Cigarettes  Last attempt to quit: 10/17/2015    Years since quitting: 3.1  . Smokeless tobacco: Never Used  Substance Use Topics  . Alcohol use: Yes    Alcohol/week: 0.0 standard drinks    Comment: glass of wine at night  . Drug use: No     Allergies   Amlodipine   Review of Systems Review of Systems  Constitutional: Positive for fatigue. Negative for appetite change, chills and diaphoresis.  HENT: Negative for congestion, rhinorrhea and sore throat.   Eyes: Negative for redness.  Respiratory: Positive for shortness of breath. Negative for cough and chest tightness.   Cardiovascular: Positive for palpitations. Negative for chest pain and leg swelling.  Gastrointestinal: Negative for abdominal distention, abdominal pain, anal bleeding, blood in stool, constipation, diarrhea, nausea and vomiting.  Genitourinary: Negative for dysuria and hematuria.  Musculoskeletal: Negative for back pain and myalgias.  Skin: Negative for rash.  Allergic/Immunologic: Positive for immunocompromised state.  Neurological: Negative for dizziness, weakness, light-headedness and headaches.  Psychiatric/Behavioral: Negative for  confusion.     Physical Exam Updated Vital Signs BP 124/71   Pulse 79   Temp 98.5 F (36.9 C) (Oral)   Resp 18   Ht 5\' 6"  (1.676 m)   Wt 99.7 kg   SpO2 98%   BMI 35.48 kg/m   Physical Exam Vitals signs and nursing note reviewed.  Constitutional:      General: He is not in acute distress.    Appearance: He is obese.     Comments: Chronically ill appearing  HENT:     Head: Normocephalic.     Nose: Nose normal.     Mouth/Throat:     Mouth: Mucous membranes are moist.     Pharynx: Oropharynx is clear.  Eyes:     Conjunctiva/sclera: Conjunctivae normal.     Pupils: Pupils are equal, round, and reactive to light.  Cardiovascular:     Rate and Rhythm: Normal rate.  Pulmonary:     Effort: Pulmonary effort is normal.     Breath sounds: Normal breath sounds.  Abdominal:     General: Abdomen is flat. Bowel sounds are normal.  Genitourinary:    Rectum: No tenderness, external hemorrhoid or internal hemorrhoid. Normal anal tone.  Lymphadenopathy:     Cervical: No cervical adenopathy.  Skin:    General: Skin is warm.     Capillary Refill: Capillary refill takes less than 2 seconds.     Coloration: Skin is pale.     Findings: No erythema or rash.  Neurological:     General: No focal deficit present.     Mental Status: He is alert.  Psychiatric:        Mood and Affect: Mood normal.      ED Treatments / Results  Labs (all labs ordered are listed, but only abnormal results are displayed) Labs Reviewed  COMPREHENSIVE METABOLIC PANEL - Abnormal; Notable for the following components:      Result Value   Sodium 134 (*)    CO2 21 (*)    Glucose, Bld 136 (*)    Calcium 8.4 (*)    Total Bilirubin 2.2 (*)    All other components within normal limits  CBC - Abnormal; Notable for the following components:   RBC 1.93 (*)    Hemoglobin 6.1 (*)    HCT 20.3 (*)    MCV 105.2 (*)    RDW 23.5 (*)    nRBC 1.3 (*)    All other components within normal  limits  BASIC METABOLIC  PANEL - Abnormal; Notable for the following components:   Glucose, Bld 119 (*)    Calcium 8.2 (*)    All other components within normal limits  CBC - Abnormal; Notable for the following components:   WBC 3.5 (*)    RBC 2.35 (*)    Hemoglobin 7.3 (*)    HCT 23.3 (*)    RDW 21.1 (*)    nRBC 1.4 (*)    All other components within normal limits  HEMOGLOBIN AND HEMATOCRIT, BLOOD - Abnormal; Notable for the following components:   Hemoglobin 8.6 (*)    HCT 26.9 (*)    All other components within normal limits  POC OCCULT BLOOD, ED  TYPE AND SCREEN  PREPARE RBC (CROSSMATCH)  PREPARE RBC (CROSSMATCH)    EKG None  Radiology Ct Abdomen Pelvis Wo Contrast  Result Date: 12/14/2018 CLINICAL DATA:  Acute blood loss/anemia history of recent trans arterial chemoembolization of the left hepatic artery for a liver metastasis on January 17th. EXAM: CT ABDOMEN AND PELVIS WITHOUT CONTRAST TECHNIQUE: Multidetector CT imaging of the abdomen and pelvis was performed following the standard protocol without IV contrast. COMPARISON:  CT scan 06/09/2018 FINDINGS: Lower chest: The lung bases demonstrate emphysematous changes and pulmonary scarring. The heart is normal in size. No pericardial effusion. Coronary artery calcifications are noted. Hepatobiliary: Evidence of prior chemoembolization of a left hepatic lobe lesion. Coils are noted in the distal left hepatic artery and radiopaque material is noted in the metastasis. There is some inflammation in the surrounding perihepatic fat but I do not see any evidence of a large bleed/hematoma. No perihepatic fluid collections. There is a new hepatic metastatic lesion in segment 2 of the liver this measures 4.7 x 3.7 cm on image number 8. I do not see this on the prior CT scan. No definite right hepatic lobe lesions are identified. The gallbladder demonstrates a single calcified gallstone measuring approximately 9 mm. No findings for acute cholecystitis. Pancreas: No  mass, inflammation or ductal dilatation. Prominent fatty interstices. Spleen: Stable mild splenomegaly.  No splenic lesions. Adrenals/Urinary Tract: The adrenal glands are unremarkable and stable. Simple right renal cyst. No renal calculi or hydronephrosis. The bladder is unremarkable. Stomach/Bowel: The stomach, duodenum, small bowel and colon are grossly normal without oral contrast. No acute inflammatory changes, mass lesions or obstructive findings. The right colon and small bowel loops are displaced by the large right pelvic/abdominal mass. Sigmoid colon diverticulosis without findings for acute diverticulitis. Vascular/Lymphatic: Stable atherosclerotic calcifications involving the aorta and iliac arteries. Small scattered mesenteric and retroperitoneal lymph nodes. Reproductive: The prostate gland and seminal vesicles are normal. Other: Stable large, 25.5 x 17.0 x 13.7 cm right pelvic/lower abdominal mass consistent with patient's known sarcoma. No free pelvic fluid collections or pelvic hematoma. Musculoskeletal: No significant bony findings. IMPRESSION: 1. Overall stable large pelvic/lower abdominal mass consistent with patient's known sarcoma. 2. Expected changes from a recent left hepatic lobe arterial chemoembolization procedure. I do not see any worrisome complicating features such as acute hemorrhage or large hematoma. 3. New left hepatic lobe metastasis measuring 4.7 x 3.7 cm. 4. No free abdominal/pelvic fluid or hematoma to account for the patient's low hemoglobin. 5. Cholelithiasis but no CT findings for acute cholecystitis. 6. Emphysematous changes and bibasilar scarring. 7. Advanced atherosclerotic calcifications involving the aorta and branch vessels. Electronically Signed   By: Marijo Sanes M.D.   On: 12/14/2018 18:03    Procedures Procedures (including critical care time)  Medications  Ordered in ED Medications  ipratropium-albuterol (DUONEB) 0.5-2.5 (3) MG/3ML nebulizer solution 3 mL (3  mLs Nebulization Given 12/14/18 1525)  furosemide (LASIX) injection 20 mg (20 mg Intravenous Given 12/14/18 2026)     Initial Impression / Assessment and Plan / ED Course  I have reviewed the triage vital signs and the nursing notes.  Pertinent labs & imaging results that were available during my care of the patient were reviewed by me and considered in my medical decision making (see chart for details).  Clinical Course as of Dec 16 1520  Thu Dec 14, 2018  1527 Patient with metastatic cancer, CHF, COPD and other complicated medical hx presenting with hemoglobin 6.1 from PMD. Patient symptomatic. Has hx of the same in the past but was previously related to GI bleed. Occult stool negative today. Plan to transfuse and admit patient.    [KM]    Clinical Course User Index [KM] Alveria Apley, PA-C    CRITICAL CARE Performed by: Alveria Apley   Total critical care time: 30-75 minutes  Critical care time was exclusive of separately billable procedures and treating other patients.  Critical care was necessary to treat or prevent imminent or life-threatening deterioration.  Critical care was time spent personally by me on the following activities: development of treatment plan with patient and/or surrogate as well as nursing, discussions with consultants, evaluation of patient's response to treatment, examination of patient, obtaining history from patient or surrogate, ordering and performing treatments and interventions, ordering and review of laboratory studies, ordering and review of radiographic studies, pulse oximetry and re-evaluation of patient's condition.   Final Clinical Impressions(s) / ED Diagnoses   Final diagnoses:  Symptomatic anemia    ED Discharge Orders    None       Kristine Royal 12/16/18 1522    Carmin Muskrat, MD 12/19/18 [phone]

## 2018-12-14 NOTE — ED Notes (Signed)
Notified Tim,RN 2 units of blood ready for this pt. Per blood bank.

## 2018-12-14 NOTE — ED Triage Notes (Signed)
Patient was sent over by his PCP for hgb-5.7. patient is on home O2 6L/min via [place] 97% on O2 6L/min via [place].

## 2018-12-15 DIAGNOSIS — J9622 Acute and chronic respiratory failure with hypercapnia: Secondary | ICD-10-CM

## 2018-12-15 DIAGNOSIS — F419 Anxiety disorder, unspecified: Secondary | ICD-10-CM | POA: Diagnosis not present

## 2018-12-15 DIAGNOSIS — J9621 Acute and chronic respiratory failure with hypoxia: Secondary | ICD-10-CM

## 2018-12-15 DIAGNOSIS — D62 Acute posthemorrhagic anemia: Secondary | ICD-10-CM | POA: Diagnosis not present

## 2018-12-15 LAB — BASIC METABOLIC PANEL
Anion gap: 7 (ref 5–15)
BUN: 12 mg/dL (ref 8–23)
CO2: 25 mmol/L (ref 22–32)
Calcium: 8.2 mg/dL — ABNORMAL LOW (ref 8.9–10.3)
Chloride: 104 mmol/L (ref 98–111)
Creatinine, Ser: 0.97 mg/dL (ref 0.61–1.24)
GFR calc Af Amer: >60 mL/min (ref >60)
GFR calc non Af Amer: >60 mL/min (ref >60)
GLUCOSE: 119 mg/dL — AB (ref 70–99)
Potassium: 3.8 mmol/L (ref 3.5–5.1)
Sodium: 136 mmol/L (ref 135–145)

## 2018-12-15 LAB — HEMOGLOBIN AND HEMATOCRIT, BLOOD
HCT: 26.9 % — ABNORMAL LOW (ref 39.0–52.0)
Hemoglobin: 8.6 g/dL — ABNORMAL LOW (ref 13.0–17.0)

## 2018-12-15 LAB — CBC
HCT: 23.3 % — ABNORMAL LOW (ref 39.0–52.0)
Hemoglobin: 7.3 g/dL — ABNORMAL LOW (ref 13.0–17.0)
MCH: 31.1 pg (ref 26.0–34.0)
MCHC: 31.3 g/dL (ref 30.0–36.0)
MCV: 99.1 fL (ref 80.0–100.0)
Platelets: 175 10*3/uL (ref 150–400)
RBC: 2.35 MIL/uL — ABNORMAL LOW (ref 4.22–5.81)
RDW: 21.1 % — ABNORMAL HIGH (ref 11.5–15.5)
WBC: 3.5 10*3/uL — ABNORMAL LOW (ref 4.0–10.5)
nRBC: 1.4 % — ABNORMAL HIGH (ref 0.0–0.2)

## 2018-12-15 LAB — PREPARE RBC (CROSSMATCH)

## 2018-12-15 MED ORDER — CHLORHEXIDINE GLUCONATE 0.12 % MT SOLN
15.0000 mL | Freq: Two times a day (BID) | OROMUCOSAL | Status: DC
  Administered 2018-12-15: 15 mL via OROMUCOSAL
  Filled 2018-12-15: qty 15

## 2018-12-15 MED ORDER — BUDESONIDE 0.25 MG/2ML IN SUSP
0.2500 mg | Freq: Two times a day (BID) | RESPIRATORY_TRACT | Status: DC
  Administered 2018-12-15: 0.25 mg via RESPIRATORY_TRACT
  Filled 2018-12-15: qty 2

## 2018-12-15 MED ORDER — IPRATROPIUM-ALBUTEROL 0.5-2.5 (3) MG/3ML IN SOLN
3.0000 mL | Freq: Four times a day (QID) | RESPIRATORY_TRACT | Status: DC
  Administered 2018-12-15 (×3): 3 mL via RESPIRATORY_TRACT
  Filled 2018-12-15 (×3): qty 3

## 2018-12-15 MED ORDER — ORAL CARE MOUTH RINSE: 15.0000 mL | Freq: Two times a day (BID) | OROMUCOSAL | Status: DC

## 2018-12-15 MED ORDER — SODIUM CHLORIDE 0.9% IV SOLUTION: Freq: Once | INTRAVENOUS | Status: DC

## 2018-12-15 NOTE — Progress Notes (Signed)
Report received from V. Jimmye Norman, Therapist, sports. Will continue to monitor closely. Carnella [place] I

## 2018-12-15 NOTE — Care Management Note (Signed)
Case Management Note  Patient Details  Name: [name] MRN: [id number] Date of Birth: 10-05-1946  Subjective/Objective: Anemia. From home w/spouse.DNR. Active w/[facility]. [employer] for home 02 2 4l continuous-has travel tank.                   Action/Plan:dc plan home.   Expected Discharge Date:  (unknown)               Expected Discharge Plan:  Home/Self Care  In-House Referral:     Discharge planning Services  CM Consult  Post Acute Care Choice:  Durable Medical Equipment(Active w/[facility].Active w/[employer]-home 02) Choice offered to:     DME Arranged:    DME Agency:     HH Arranged:    HH Agency:     Status of Service:  In process, will continue to follow  If discussed at Long Length of Stay Meetings, dates discussed:    Additional Comments:  Dessa Phi, RN 12/15/2018, 1:40 PM

## 2018-12-15 NOTE — Care Management Obs Status (Signed)
[facility] NOTIFICATION   Patient Details  Name: DARRYN KYDD MRN: [id number] Date of Birth: [birth year]   Medicare Observation Status Notification Given:       Dessa Phi, RN 12/15/2018, 1:47 PM

## 2018-12-15 NOTE — Discharge Summary (Signed)
Physician Discharge Summary  [name] OAC:[id number] DOB: 1946/06/01 DOA: 12/14/2018  PCP: [name], MD  Admit date: 12/14/2018 Discharge date: 12/15/2018  Admitted From: Home Disposition:  Home  Recommendations for Outpatient Follow-up:  1. Follow up with PCP in 1-2 weeks 2. Recommend repeat CBC in 1-2 weeks  Discharge Condition:Improved CODE STATUS:DNR Diet recommendation: Regular   Brief/Interim Summary: 73 y.o. 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  His hemoglobin on January 18 was at 8. Of Note  he also on July 2019 was admitted at [facility] with low hemoglobin.  He had a CT abdomen and pelvis with enterography showing evidence of focal active intraluminal a small bowel hemorrhage in the right lower quadrant, IR was consulted for possible embolization of the bleeding but no reported  finding of source for intervention of the bleeding.   During that admission patient receive blood transfusion , hb stabilized  and he was discharged home.   Patient went to see his primary care doctor today for hospital follow-up after recent chemoembolization.  He has been feeling weak and tired.  He can tell when his hemoglobin is low, because he feels palpitation.  He has been having difficulty walking in his house. Denies chest pain, he is complaining of shortness of breath.  No blood in the stool.  Evaluation in the ED; occult blood negative, hemoglobin at this point 1, platelets 191, sodium 134 CO2 21, BUN 12 creatinine 1.89.   Discharge Diagnoses:  Active Problems:   HYPERCHOLESTEROLEMIA   Anxiety   Essential hypertension   COPD mixed type (HCC)   Acute on chronic respiratory failure with  hypoxia and hypercapnia (HCC)   Dilated cardiomyopathy (HCC)   Pulmonary hypertension (HCC)   Acute blood loss anemia   Liver metastasis (HCC)   Anemia   1-Acute blood loss anemia;  Recent liver embolization liver artery. Prior bleeding from tumor, or small bowel hemorrhage.  Occult blood negative.  Patient received 2 units PRBC with post-transfusion hgb of 7.3 Given one more unit of PRBC with post transfusion hgb of 8.6 CT abdomen pelvis unremarkable for acute process Clinically improved, medically stable  2-Chronic hypoxic,hypercapnic respiratory failure.  On 6 L oxygen.  BIPAP HS , ordered.  Continue with nebulizer.   3-Anxiety; continue with xanax.   4-Abdominal spidel cell sarcoma; metastasis diseases; lung and liver;  Needs to follow up with his oncologist. Needs further discussion of goals of care with his oncologist.    5-Dilated cardiomyopathy, continue with lasix.    Discharge Instructions   Allergies as of 12/15/2018      Reactions   Amlodipine Swelling      Medication List    TAKE these medications   acetaminophen 325 MG tablet Commonly known as:  TYLENOL Take 2 tablets (650 mg total) by mouth every 6 (six) hours as needed for mild pain (or Fever >/= 101).   Albuterol Sulfate 108 (90 Base) MCG/ACT Aepb Commonly known as:  PROAIR RESPICLICK Inhale 2 puffs into the lungs every 6 (six) hours as needed (shortness of breath).   ALPRAZolam 0.5 MG tablet Commonly known as:  XANAX Take 1 tablet (0.5 mg total) by mouth at bedtime.   budesonide 0.25 MG/2ML nebulizer solution Commonly known as:  PULMICORT Take 2 mLs (0.25 mg total) by nebulization 2 (two) times daily.   cholecalciferol 10 MCG (400 UNIT) Tabs tablet Commonly known as:  VITAMIN D3 Take 400 Units by mouth at bedtime.   FLAX SEEDS PO Take 1 capsule by mouth daily.   fluticasone 50 MCG/ACT nasal spray Commonly known as:  FLONASE Place 2 sprays into both nostrils daily as needed for  allergies or rhinitis.   furosemide 40 MG tablet Commonly known as:  LASIX Take 40 mg by mouth daily.   guaiFENesin 600 MG 12 hr tablet Commonly known as:  MUCINEX Take 2 tablets (1,200 mg total) by mouth 2 (two) times daily.   ipratropium-albuterol 0.5-2.5 (3) MG/3ML Soln Commonly known as:  DUONEB Take 3 mLs by nebulization 4 (four) times daily. What changed:  when to take this   Iron 325 (65 Fe) MG Tabs Take 325 mg by mouth daily.   isosorbide mononitrate 30 MG 24 hr tablet Commonly known as:  IMDUR Take 30 mg by mouth daily.   lisinopril 10 MG tablet Commonly known as:  PRINIVIL,ZESTRIL Take 10 mg by mouth daily.   methocarbamol 500 MG tablet Commonly known as:  ROBAXIN Take 500-1,000 mg by mouth 2 (two) times daily as needed for muscle spasms.   multivitamin-lutein Caps capsule Take 1 capsule by mouth 2 (two) times daily.   nitroGLYCERIN 0.4 MG SL tablet Commonly known as:  NITROSTAT Place 1 tablet (0.4 mg total) under the tongue every 5 (five) minutes as needed for chest pain.   pantoprazole 40 MG tablet Commonly known as:  PROTONIX Take 1 tablet (40 mg total) by mouth daily.   simvastatin 20 MG tablet Commonly known as:  ZOCOR Take 1 tablet (20 mg total) by mouth daily at 6 PM.   sodium chloride 0.65 % Soln nasal spray Commonly known as:  OCEAN Place 1 spray into both nostrils as needed for congestion. What changed:  when to take this   SYSTANE OP Apply 1 drop to eye daily as needed (dry eyes).   vitamin B-12 1000 MCG tablet Commonly known as:  CYANOCOBALAMIN Take 1,000 mcg by mouth daily.   vitamin C 1000 MG tablet Take 1,000 mg by mouth daily.      Follow-up Information    [name], MD. Schedule an appointment as soon as possible for a visit in 2 week(s).   Specialty:  Internal Medicine Contact information: [place] [phone]          Allergies  Allergen Reactions  . Amlodipine Swelling     Procedures/Studies: Ct Abdomen Pelvis Wo Contrast  Result Date: 12/14/2018 CLINICAL DATA:  Acute blood loss/anemia history of recent trans arterial chemoembolization of the left hepatic artery for a liver metastasis on January 17th. EXAM: CT ABDOMEN AND PELVIS WITHOUT CONTRAST TECHNIQUE: Multidetector CT imaging of the abdomen and pelvis was performed following the standard protocol without IV contrast. COMPARISON:  CT scan 06/09/2018 FINDINGS: Lower chest: The lung bases demonstrate emphysematous changes and pulmonary scarring. The heart is normal in size. No pericardial effusion. Coronary artery calcifications are noted. Hepatobiliary: Evidence of prior chemoembolization of a left hepatic lobe lesion. Coils are noted in the distal left hepatic artery and radiopaque material is noted in the metastasis. There is some inflammation in the surrounding perihepatic fat but I do not see any evidence of a large bleed/hematoma. No perihepatic fluid collections. There is a new hepatic metastatic lesion in segment 2 of the liver this measures  4.7 x 3.7 cm on image number 8. I do not see this on the prior CT scan. No definite right hepatic lobe lesions are identified. The gallbladder demonstrates a single calcified gallstone measuring approximately 9 mm. No findings for acute cholecystitis. Pancreas: No mass, inflammation or ductal dilatation. Prominent fatty interstices. Spleen: Stable mild splenomegaly.  No splenic lesions. Adrenals/Urinary Tract: The adrenal glands are unremarkable and stable. Simple right renal cyst. No renal calculi or hydronephrosis. The bladder is unremarkable. Stomach/Bowel: The stomach, duodenum, small bowel and colon are grossly normal without oral contrast. No acute inflammatory changes, mass lesions or obstructive findings. The right colon and small bowel loops are displaced by the large right pelvic/abdominal mass. Sigmoid colon diverticulosis without findings for acute diverticulitis.  Vascular/Lymphatic: Stable atherosclerotic calcifications involving the aorta and iliac arteries. Small scattered mesenteric and retroperitoneal lymph nodes. Reproductive: The prostate gland and seminal vesicles are normal. Other: Stable large, 25.5 x 17.0 x 13.7 cm right pelvic/lower abdominal mass consistent with patient's known sarcoma. No free pelvic fluid collections or pelvic hematoma. Musculoskeletal: No significant bony findings. IMPRESSION: 1. Overall stable large pelvic/lower abdominal mass consistent with patient's known sarcoma. 2. Expected changes from a recent left hepatic lobe arterial chemoembolization procedure. I do not see any worrisome complicating features such as acute hemorrhage or large hematoma. 3. New left hepatic lobe metastasis measuring 4.7 x 3.7 cm. 4. No free abdominal/pelvic fluid or hematoma to account for the patient's low hemoglobin. 5. Cholelithiasis but no CT findings for acute cholecystitis. 6. Emphysematous changes and bibasilar scarring. 7. Advanced atherosclerotic calcifications involving the aorta and branch vessels. Electronically Signed   By: Marijo Sanes M.D.   On: 12/14/2018 18:03     Subjective: Eager to go home  Discharge Exam: Vitals:   12/15/18 1441 12/15/18 1548  BP: 124/71   Pulse: 79   Resp: 18   Temp: 98.5 F (36.9 C)   SpO2: 99% 98%   Vitals:   12/15/18 1156 12/15/18 1214 12/15/18 1441 12/15/18 1548  BP: 122/61 119/69 124/71   Pulse: 77 80 79   Resp: (!) 21 20 18    Temp: 98.5 F (36.9 C) 98.4 F (36.9 C) 98.5 F (36.9 C)   TempSrc: Oral Oral Oral   SpO2: 100% 99% 99% 98%  Weight:      Height:        General: Pt is alert, awake, not in acute distress Cardiovascular: RRR, S1/S2 +, no rubs, no gallops Respiratory: CTA bilaterally, no wheezing, no rhonchi Abdominal: Soft, NT, ND, bowel sounds + Extremities: no edema, no cyanosis   The results of significant diagnostics from this hospitalization (including imaging,  microbiology, ancillary and laboratory) are listed below for reference.     Microbiology: No results found for this or any previous visit (from the past 240 hour(s)).   Labs: BNP (last 3 results) No results for input(s): BNP in the last 8760 hours. Basic Metabolic Panel: Recent Labs  Lab 12/14/18 1354 12/15/18 0218  NA 134* 136  K 3.9 3.8  CL 103 104  CO2 21* 25  GLUCOSE 136* 119*  BUN 12 12  CREATININE 0.89 0.97  CALCIUM 8.4* 8.2*   Liver Function Tests: Recent Labs  Lab 12/14/18 1354  AST 40  ALT 36  ALKPHOS 48  BILITOT 2.2*  PROT 6.8  ALBUMIN 3.8   No results for input(s): LIPASE, AMYLASE in the last 168 hours. No results for input(s): AMMONIA in the last 168 hours. CBC: Recent Labs  Lab  12/14/18 1354 12/15/18 0218  WBC 4.7 3.5*  HGB 6.1* 7.3*  HCT 20.3* 23.3*  MCV 105.2* 99.1  PLT 191 175   Cardiac Enzymes: No results for input(s): CKTOTAL, CKMB, CKMBINDEX, TROPONINI in the last 168 hours. BNP: Invalid input(s): POCBNP CBG: No results for input(s): GLUCAP in the last 168 hours. D-Dimer No results for input(s): DDIMER in the last 72 hours. Hgb A1c No results for input(s): HGBA1C in the last 72 hours. Lipid Profile No results for input(s): CHOL, HDL, LDLCALC, TRIG, CHOLHDL, LDLDIRECT in the last 72 hours. Thyroid function studies No results for input(s): TSH, T4TOTAL, T3FREE, THYROIDAB in the last 72 hours.  Invalid input(s): FREET3 Anemia work up No results for input(s): VITAMINB12, FOLATE, FERRITIN, TIBC, IRON, RETICCTPCT in the last 72 hours. Urinalysis    Component Value Date/Time   COLORURINE YELLOW 01/19/2017 1739   APPEARANCEUR CLEAR 01/19/2017 1739   LABSPEC 1.023 01/19/2017 1739   PHURINE 5.0 01/19/2017 1739   GLUCOSEU NEGATIVE 01/19/2017 1739   GLUCOSEU NEGATIVE 06/30/2010 1050   HGBUR NEGATIVE 01/19/2017 1739   BILIRUBINUR NEGATIVE 01/19/2017 1739   KETONESUR 5 (A) 01/19/2017 1739   PROTEINUR 30 (A) 01/19/2017 1739    UROBILINOGEN 0.2 06/30/2010 1050   NITRITE NEGATIVE 01/19/2017 1739   LEUKOCYTESUR NEGATIVE 01/19/2017 1739   Sepsis Labs Invalid input(s): PROCALCITONIN,  WBC,  LACTICIDVEN Microbiology No results found for this or any previous visit (from the past 240 hour(s)).  Time spent: 6min  SIGNED:   Marylu Lund, MD  [facility] 12/15/2018, 4:00 PM  If 7PM-7AM, please contact night-coverage

## 2018-12-16 LAB — TYPE AND SCREEN
ABO/RH(D): A POS
ANTIBODY SCREEN: NEGATIVE
Unit division: 0
Unit division: 0
Unit division: 0

## 2018-12-16 LAB — BPAM RBC
BLOOD PRODUCT EXPIRATION DATE: 202002242359
Blood Product Expiration Date: 202002242359
Blood Product Expiration Date: 202002242359
ISSUE DATE / TIME: 202001301548
ISSUE DATE / TIME: 202001302051
ISSUE DATE / TIME: 202001311145
Unit Type and Rh: 6200
Unit Type and Rh: 6200
Unit Type and Rh: 6200

## 2018-12-19 ENCOUNTER — Telehealth: Payer: Self-pay | Admitting: Pulmonary Disease

## 2018-12-19 MED ORDER — BUDESONIDE 0.25 MG/2ML IN SUSP: 0.2500 mg | Freq: Two times a day (BID) | RESPIRATORY_TRACT | 3 refills | Status: DC

## 2018-12-19 MED ORDER — IPRATROPIUM-ALBUTEROL 0.5-2.5 (3) MG/3ML IN SOLN: 3.0000 mL | RESPIRATORY_TRACT | 3 refills | Status: DC | PRN

## 2018-12-19 NOTE — Telephone Encounter (Signed)
Called and spoke with patient regarding in need of a refill for duoneb and pulmicort neb meds sent to [name] today. Advised patient that we are able to send these scripts into [facility] to refill. Spoke with Jess and TP to make sure this was okay to refill, they both advised yes as pt was last seen 10/26/18 Both scripts were sent to [facility] [place] today Nothing further needed.

## 2018-12-21 ENCOUNTER — Encounter: Payer: Self-pay | Admitting: Pulmonary Disease

## 2018-12-21 ENCOUNTER — Ambulatory Visit (INDEPENDENT_AMBULATORY_CARE_PROVIDER_SITE_OTHER): Payer: Medicare Other | Admitting: Pulmonary Disease

## 2018-12-21 VITALS — BP 142/88 | HR 94 | Ht 66.0 in | Wt 236.0 lb

## 2018-12-21 DIAGNOSIS — Z66 Do not resuscitate: Secondary | ICD-10-CM | POA: Diagnosis not present

## 2018-12-21 DIAGNOSIS — J9611 Chronic respiratory failure with hypoxia: Secondary | ICD-10-CM | POA: Diagnosis not present

## 2018-12-21 DIAGNOSIS — C499 Malignant neoplasm of connective and soft tissue, unspecified: Secondary | ICD-10-CM

## 2018-12-21 DIAGNOSIS — J449 Chronic obstructive pulmonary disease, unspecified: Secondary | ICD-10-CM

## 2018-12-21 DIAGNOSIS — J9612 Chronic respiratory failure with hypercapnia: Secondary | ICD-10-CM | POA: Diagnosis not present

## 2018-12-21 MED ORDER — REVEFENACIN 175 MCG/3ML IN SOLN: 3.0000 mL | Freq: Every day | RESPIRATORY_TRACT | 5 refills | Status: DC

## 2018-12-21 MED ORDER — IPRATROPIUM-ALBUTEROL 0.5-2.5 (3) MG/3ML IN SOLN: 3.0000 mL | RESPIRATORY_TRACT | 5 refills | Status: DC | PRN

## 2018-12-21 MED ORDER — FORMOTEROL FUMARATE 20 MCG/2ML IN NEBU: 20.0000 ug | INHALATION_SOLUTION | Freq: Two times a day (BID) | RESPIRATORY_TRACT | 5 refills | Status: DC

## 2018-12-21 MED ORDER — BUDESONIDE 0.25 MG/2ML IN SUSP: 0.2500 mg | Freq: Two times a day (BID) | RESPIRATORY_TRACT | 5 refills | Status: DC

## 2018-12-21 MED ORDER — REVEFENACIN 175 MCG/3ML IN SOLN: 3.0000 mL | Freq: Every day | RESPIRATORY_TRACT | 0 refills | Status: DC

## 2018-12-21 MED ORDER — FORMOTEROL FUMARATE 20 MCG/2ML IN NEBU: 20.0000 ug | INHALATION_SOLUTION | Freq: Two times a day (BID) | RESPIRATORY_TRACT | 0 refills | Status: DC

## 2018-12-21 NOTE — Progress Notes (Signed)
Synopsis: Referred in Feb 2020 for COPD, former patient of Dr. Lenna Gilford, PCP is [name], MD  Subjective:   PATIENT ID: [name] GENDER: male DOB: [birth year], MRN: [id number]  Chief Complaint  Patient presents with  . Consult    [place] patient. Currently using 6L of oxygen.     This is a 73 year old gentleman with COPD, chronic diastolic heart failure, chronic hypoxemic respiratory failure.  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  New left lung nodule and mediastinal adenopathy concern for metastatic disease.  Has been undergoing evaluation by [facility] oncology.  As for his COPD and hypoxemic respiratory failure he uses 4 L at baseline 6 L with ambulation, BiPAP nightly.  Currently on nebs, duo nebs and budesonide.  Also has history of hypertension and CAD.  Has had multiple stents.  Followed by Dr. Burt Knack.  Recently underwent TACE procedure for liver metastasis.  Overall today in the office we discussed his trajectory and plan of care.  He understands that he has a disease that he has been told at this point is not curable but he understands that he would like to continue to do everything he can to stay alive.  His shortness of breath and dyspnea on exertion is very limiting at this point.  He said he feels like he could go on further if he could just have something that would make him breathe better.  He uses a portable concentrator at 6 L with exertion.  And at rest he is able to use a tank at 4 L.  Otherwise he has pretty good spirits in comparison to everything that has been going on and has tolerated the procedure so far.  Appreciative of the care that he has received at [facility].   Past Medical History:  Diagnosis Date  . Anxiety   . Bladder cancer ([place]) 2011  . Borderline diabetes mellitus   . CAD (coronary artery disease)   . Cigarette smoker   . COPD (chronic obstructive pulmonary disease) ([place])   . DJD (degenerative joint  disease)   . Emphysema of lung ([place])   . Epistaxis   . History of colonic polyps 2004   hyperplastic   . History of sinusitis   . Hypercholesteremia   . Hypertension   . Obesity      Family History  Problem Relation Age of Onset  . Heart disease Mother   . Heart disease Brother   . Cancer Brother   . Aneurysm Brother      Past Surgical History:  Procedure Laterality Date  . cataract surgery  septemeber 2013  . cataract surgery/sub posterior vitrectomy in left eye  1996   Dr. Zadie Rhine  . [facility]  [phone]    Social History   Socioeconomic History  . Marital status: Married    Spouse name: [name]  . Number of children: 2  . Years of education: Not on file  . Highest education level: Not on file  Occupational History  . Occupation: Retired  Scientific laboratory technician  . Financial resource strain: Not on file  . Food insecurity:    Worry: Not on file    Inability: Not on file  . Transportation needs:    Medical: Not on file    Non-medical: Not on file  Tobacco Use  . Smoking status: Former Smoker    Packs/day: 2.00    Years: 45.00    Pack years: 90.00    Types: Cigarettes  Last attempt to quit: 10/17/2015    Years since quitting: 3.1  . Smokeless tobacco: Never Used  Substance and Sexual Activity  . Alcohol use: Yes    Alcohol/week: 0.0 standard drinks    Comment: glass of wine at night  . Drug use: No  . Sexual activity: Not on file  Lifestyle  . Physical activity:    Days per week: Not on file    Minutes per session: Not on file  . Stress: Not on file  Relationships  . Social connections:    Talks on phone: Not on file    Gets together: Not on file    Attends religious service: Not on file    Active member of club or organization: Not on file    Attends meetings of clubs or organizations: Not on file    Relationship status: Not on file  . Intimate partner violence:    Fear of current or ex partner: Not on file    Emotionally abused: Not on file     Physically abused: Not on file    Forced sexual activity: Not on file  Other Topics Concern  . Not on file  Social History Narrative  . Not on file     Allergies  Allergen Reactions  . Amlodipine Swelling     Outpatient Medications Prior to Visit  Medication Sig Dispense Refill  . acetaminophen (TYLENOL) 325 MG tablet Take 2 tablets (650 mg total) by mouth every 6 (six) hours as needed for mild pain (or Fever >/= 101). (Patient not taking: Reported on 12/14/2018)    . Albuterol Sulfate (PROAIR RESPICLICK) 527 (90 Base) MCG/ACT AEPB Inhale 2 puffs into the lungs every 6 (six) hours as needed (shortness of breath). 1 each 6  . ALPRAZolam (XANAX) 0.5 MG tablet Take 1 tablet (0.5 mg total) by mouth at bedtime. 30 tablet 2  . Ascorbic Acid (VITAMIN C) 1000 MG tablet Take 1,000 mg by mouth daily.    . cholecalciferol (VITAMIN D) 400 units TABS tablet Take 400 Units by mouth at bedtime.    . Ferrous Sulfate (IRON) 325 (65 Fe) MG TABS Take 325 mg by mouth daily.    . Flaxseed, Linseed, (FLAX SEEDS PO) Take 1 capsule by mouth daily.    . fluticasone (FLONASE) 50 MCG/ACT nasal spray Place 2 sprays into both nostrils daily as needed for allergies or rhinitis.   3  . furosemide (LASIX) 40 MG tablet Take 40 mg by mouth daily.     [name] guaiFENesin (MUCINEX) 600 MG 12 hr tablet Take 2 tablets (1,200 mg total) by mouth 2 (two) times daily.    . isosorbide mononitrate (IMDUR) 30 MG 24 hr tablet Take 30 mg by mouth daily.    [name] lisinopril (PRINIVIL,ZESTRIL) 10 MG tablet Take 10 mg by mouth daily.    . methocarbamol (ROBAXIN) 500 MG tablet Take 500-1,000 mg by mouth 2 (two) times daily as needed for muscle spasms.   1  . multivitamin-lutein (OCUVITE-LUTEIN) CAPS capsule Take 1 capsule by mouth 2 (two) times daily.     . nitroGLYCERIN (NITROSTAT) 0.4 MG SL tablet Place 1 tablet (0.4 mg total) under the tongue every 5 (five) minutes as needed for chest pain. 25 tablet 3  . pantoprazole (PROTONIX) 40 MG tablet  Take 1 tablet (40 mg total) by mouth daily. 30 tablet 0  . Polyethyl Glycol-Propyl Glycol (SYSTANE OP) Apply 1 drop to eye daily as needed (dry eyes).    . simvastatin (ZOCOR)  20 MG tablet Take 1 tablet (20 mg total) by mouth daily at 6 PM. 30 tablet 2  . sodium chloride (OCEAN) 0.65 % SOLN nasal spray Place 1 spray into both nostrils as needed for congestion. (Patient taking differently: Place 1 spray into both nostrils daily as needed for congestion. )  0  . vitamin B-12 (CYANOCOBALAMIN) 1000 MCG tablet Take 1,000 mcg by mouth daily.    . budesonide (PULMICORT) 0.25 MG/2ML nebulizer solution Take 2 mLs (0.25 mg total) by nebulization 2 (two) times daily. 120 mL 3  . ipratropium-albuterol (DUONEB) 0.5-2.5 (3) MG/3ML SOLN Take 3 mLs by nebulization every 4 (four) hours as needed. 120 mL 3   No facility-administered medications prior to visit.     Review of Systems  Constitutional: Negative for chills, fever, malaise/fatigue and weight loss.  HENT: Negative for hearing loss, sore throat and tinnitus.   Eyes: Negative for blurred vision and double vision.  Respiratory: Positive for cough, shortness of breath and wheezing. Negative for hemoptysis, sputum production and stridor.   Cardiovascular: Positive for leg swelling. Negative for chest pain, palpitations, orthopnea and PND.  Gastrointestinal: Positive for abdominal pain and nausea. Negative for constipation, diarrhea, heartburn and vomiting.  Genitourinary: Negative for dysuria, hematuria and urgency.  Musculoskeletal: Negative for joint pain and myalgias.  Skin: Negative for itching and rash.  Neurological: Negative for dizziness, tingling, weakness and headaches.  Endo/Heme/Allergies: Negative for environmental allergies. Does not bruise/bleed easily.  Psychiatric/Behavioral: Negative for depression. The patient is not nervous/anxious and does not have insomnia.   All other systems reviewed and are negative.    Objective:  Physical  Exam Vitals signs reviewed.  Constitutional:      General: He is not in acute distress.    Appearance: He is well-developed. He is obese.  HENT:     Head: Normocephalic and atraumatic.  Eyes:     General: Scleral icterus present.     Conjunctiva/sclera: Conjunctivae normal.     Pupils: Pupils are equal, round, and reactive to light.  Neck:     Musculoskeletal: Neck supple.     Vascular: No JVD.     Trachea: No tracheal deviation.  Cardiovascular:     Rate and Rhythm: Normal rate and regular rhythm.     Heart sounds: Normal heart sounds. No murmur.  Pulmonary:     Effort: Pulmonary effort is normal. No tachypnea, accessory muscle usage or respiratory distress.     Breath sounds: Normal breath sounds. No stridor. No wheezing, rhonchi or rales.  Abdominal:     General: Bowel sounds are normal. There is no distension.     Palpations: Abdomen is soft.     Tenderness: There is no abdominal tenderness.     Comments: Large palpable abdominal mass  Musculoskeletal:        General: No tenderness.  Lymphadenopathy:     Cervical: No cervical adenopathy.  Skin:    General: Skin is warm and dry.     Capillary Refill: Capillary refill takes less than 2 seconds.     Coloration: Skin is jaundiced.     Findings: No rash.     Comments: Slightly jaundiced skin  Neurological:     Mental Status: He is alert and oriented to person, place, and time.  Psychiatric:        Behavior: Behavior normal.      Vitals:   12/21/18 1107  BP: (!) 142/88  Pulse: 94  SpO2: 98%  Weight: 236 lb (107 kg)  Height: 5\' 6"  (1.676 m)   98% on RA BMI Readings from Last 3 Encounters:  12/21/18 38.09 kg/m  12/14/18 35.48 kg/m  11/02/18 36.32 kg/m   Wt Readings from Last 3 Encounters:  12/21/18 236 lb (107 kg)  12/14/18 219 lb 12.8 oz (99.7 kg)  11/02/18 225 lb (102.1 kg)     CBC    Component Value Date/Time   WBC 3.5 (L) 12/15/2018 0218   RBC 2.35 (L) 12/15/2018 0218   HGB 8.6 (L) 12/15/2018  1640   HCT 26.9 (L) 12/15/2018 1640   PLT 175 12/15/2018 0218   MCV 99.1 12/15/2018 0218   MCH 31.1 12/15/2018 0218   MCHC 31.3 12/15/2018 0218   RDW 21.1 (H) 12/15/2018 0218   LYMPHSABS 0.5 (L) 06/06/2018 1407   MONOABS 0.4 06/06/2018 1407   EOSABS 0.1 06/06/2018 1407   BASOSABS 0.0 06/06/2018 1407    Chest Imaging: Imaging completed at [facility]: There appears to be a enlarged hilar node per documentation.  I cannot see these images myself.  Radiologist commented on possible metastatic disease to the chest.  Pulmonary Functions Testing Results: PFT Results Latest Ref Rng & Units 02/03/2016  FVC-Pre L 2.57  FVC-Predicted Pre % 68  FVC-Post L 2.55  FVC-Predicted Post % 68  Pre FEV1/FVC % % 41  Post FEV1/FCV % % 42  FEV1-Pre L 1.05  FEV1-Predicted Pre % 38  FEV1-Post L 1.07  DLCO UNC% % 22  DLCO COR %Predicted % 29  TLC L 6.67  TLC % Predicted % 107  RV % Predicted % 176     FeNO: None   Pathology:  03/14/2017: Spindle Cell Neoplasm   Echocardiogram:  2018 - preseved EF 55-60%  Heart Catheterization: None     Assessment & Plan:   Chronic respiratory failure with hypoxia and hypercapnia (HCC)  COPD mixed type (HCC)  Spindle cell sarcoma (HCC)  DNR (do not resuscitate)  Discussion:  This is a 73 year old with metastatic spindle cell, large intra-abdominal tumor status post radiation.  Not currently receiving chemotherapy.  I am just now becoming a part of his pulmonary care.  He does have chronic hypoxemic respiratory failure and COPD.  Prior FEV1 38% predicted.  He has been managed on Pulmicort and duo nebs.  He has been resistant to use of inhalers due to cost.  I think we should start by trying to get additional nebulized medications approved for him through his Medicare insurance.  We will try to add Yupelri and Perforomist.  Patient was given samples of this today in the office. Additionally we will send in refills of his Pulmicort.  Today in the  office we discussed his overall goals of care and trajectory and management.  He would like to continue to everything that he can for management of his underlying chronic diseases.  I do believe however with his current multiple medical comorbidities, significant oxygen requirement, severity of COPD as well as current cancer management I think his overall prognosis is poor.  Greater than 50% of this patient's 25-minute office visit was been face-to-face discussing above recommendations and treatment plan.   Current Outpatient Medications:  .  acetaminophen (TYLENOL) 325 MG tablet, Take 2 tablets (650 mg total) by mouth every 6 (six) hours as needed for mild pain (or Fever >/= 101). (Patient not taking: Reported on 12/14/2018), Disp: , Rfl:  .  Albuterol Sulfate (PROAIR RESPICLICK) 277 (90 Base) MCG/ACT AEPB, Inhale 2 puffs into the lungs every 6 (six) hours  as needed (shortness of breath)., Disp: 1 each, Rfl: 6 .  ALPRAZolam (XANAX) 0.5 MG tablet, Take 1 tablet (0.5 mg total) by mouth at bedtime., Disp: 30 tablet, Rfl: 2 .  Ascorbic Acid (VITAMIN C) 1000 MG tablet, Take 1,000 mg by mouth daily., Disp: , Rfl:  .  budesonide (PULMICORT) 0.25 MG/2ML nebulizer solution, Take 2 mLs (0.25 mg total) by nebulization 2 (two) times daily., Disp: 120 mL, Rfl: 5 .  cholecalciferol (VITAMIN D) 400 units TABS tablet, Take 400 Units by mouth at bedtime., Disp: , Rfl:  .  Ferrous Sulfate (IRON) 325 (65 Fe) MG TABS, Take 325 mg by mouth daily., Disp: , Rfl:  .  Flaxseed, Linseed, (FLAX SEEDS PO), Take 1 capsule by mouth daily., Disp: , Rfl:  .  fluticasone (FLONASE) 50 MCG/ACT nasal spray, Place 2 sprays into both nostrils daily as needed for allergies or rhinitis. , Disp: , Rfl: 3 .  formoterol (PERFOROMIST) 20 MCG/2ML nebulizer solution, Take 2 mLs (20 mcg total) by nebulization 2 (two) times daily., Disp: 120 mL, Rfl: 5 .  furosemide (LASIX) 40 MG tablet, Take 40 mg by mouth daily. , Disp: , Rfl:  .  guaiFENesin  (MUCINEX) 600 MG 12 hr tablet, Take 2 tablets (1,200 mg total) by mouth 2 (two) times daily., Disp: , Rfl:  .  ipratropium-albuterol (DUONEB) 0.5-2.5 (3) MG/3ML SOLN, Take 3 mLs by nebulization every 4 (four) hours as needed., Disp: 120 mL, Rfl: 5 .  isosorbide mononitrate (IMDUR) 30 MG 24 hr tablet, Take 30 mg by mouth daily., Disp: , Rfl:  .  lisinopril (PRINIVIL,ZESTRIL) 10 MG tablet, Take 10 mg by mouth daily., Disp: , Rfl:  .  methocarbamol (ROBAXIN) 500 MG tablet, Take 500-1,000 mg by mouth 2 (two) times daily as needed for muscle spasms. , Disp: , Rfl: 1 .  multivitamin-lutein (OCUVITE-LUTEIN) CAPS capsule, Take 1 capsule by mouth 2 (two) times daily. , Disp: , Rfl:  .  nitroGLYCERIN (NITROSTAT) 0.4 MG SL tablet, Place 1 tablet (0.4 mg total) under the tongue every 5 (five) minutes as needed for chest pain., Disp: 25 tablet, Rfl: 3 .  pantoprazole (PROTONIX) 40 MG tablet, Take 1 tablet (40 mg total) by mouth daily., Disp: 30 tablet, Rfl: 0 .  Polyethyl Glycol-Propyl Glycol (SYSTANE OP), Apply 1 drop to eye daily as needed (dry eyes)., Disp: , Rfl:  .  Revefenacin (YUPELRI) 175 MCG/3ML SOLN, Inhale 3 mLs into the lungs daily., Disp: 42 mL, Rfl: 5 .  simvastatin (ZOCOR) 20 MG tablet, Take 1 tablet (20 mg total) by mouth daily at 6 PM., Disp: 30 tablet, Rfl: 2 .  sodium chloride (OCEAN) 0.65 % SOLN nasal spray, Place 1 spray into both nostrils as needed for congestion. (Patient taking differently: Place 1 spray into both nostrils daily as needed for congestion. ), Disp: , Rfl: 0 .  vitamin B-12 (CYANOCOBALAMIN) 1000 MCG tablet, Take 1,000 mcg by mouth daily., Disp: , Rfl:    Garner Nash, DO [facility] 12/21/2018 6:06 PM

## 2018-12-21 NOTE — Patient Instructions (Signed)
Thank you for visiting Dr. Valeta Harms at [facility]. Today we recommend the following: No orders of the defined types were placed in this encounter.  Meds ordered this encounter  Medications  . formoterol (PERFOROMIST) 20 MCG/2ML nebulizer solution    Sig: Take 2 mLs (20 mcg total) by nebulization 2 (two) times daily.    Dispense:  2 mL    Refill:  0  . Revefenacin (YUPELRI) 175 MCG/3ML SOLN    Sig: Inhale 3 mLs into the lungs daily.    Dispense:  42 mL    Refill:  0   We are starting you on Perforomist. Inhale 1 vial twice daily.   We are also starting you on Yupelri. Inhale 1 vial once daily.   This is in addition to your current medication.   Return in about 2 months (around 02/19/2019).

## 2018-12-22 DIAGNOSIS — C499 Malignant neoplasm of connective and soft tissue, unspecified: Secondary | ICD-10-CM | POA: Diagnosis not present

## 2018-12-22 DIAGNOSIS — I420 Dilated cardiomyopathy: Secondary | ICD-10-CM | POA: Diagnosis not present

## 2018-12-22 DIAGNOSIS — F418 Other specified anxiety disorders: Secondary | ICD-10-CM | POA: Diagnosis not present

## 2018-12-22 DIAGNOSIS — J449 Chronic obstructive pulmonary disease, unspecified: Secondary | ICD-10-CM | POA: Diagnosis not present

## 2018-12-22 DIAGNOSIS — D62 Acute posthemorrhagic anemia: Secondary | ICD-10-CM | POA: Diagnosis not present

## 2018-12-27 ENCOUNTER — Other Ambulatory Visit: Payer: Medicare Other | Admitting: Licensed Clinical Social Worker

## 2018-12-27 ENCOUNTER — Other Ambulatory Visit: Payer: Medicare Other | Admitting: *Deleted

## 2018-12-27 ENCOUNTER — Telehealth: Payer: Self-pay | Admitting: Pulmonary Disease

## 2018-12-27 DIAGNOSIS — Z515 Encounter for palliative care: Secondary | ICD-10-CM

## 2018-12-27 MED ORDER — IPRATROPIUM-ALBUTEROL 0.5-2.5 (3) MG/3ML IN SOLN: 3.0000 mL | RESPIRATORY_TRACT | 0 refills | Status: DC | PRN

## 2018-12-27 MED ORDER — REVEFENACIN 175 MCG/3ML IN SOLN: 3.0000 mL | Freq: Every day | RESPIRATORY_TRACT | 0 refills | Status: DC

## 2018-12-27 NOTE — Telephone Encounter (Signed)
Called and spoke with Patient.  Dr Valeta Harms instructions for 90 supply given.  Patient stated he used [employer]. Duo nebs and Yupelri nebs 90 day supply sent to requested [place].  Nothing further at this time.

## 2018-12-27 NOTE — Telephone Encounter (Signed)
Palliative Care SW spoke with patient and scheduled a home visit for today at 12:00.

## 2018-12-27 NOTE — Telephone Encounter (Signed)
Called and spoke with patient and patients wife. Patient stated that he was not given enough duoneb nebulizers as well as not enough Yupelri. Patient stated that he takes does the duoneb every 2 hours and sometimes every 4 hours. Patient stated that he needs 245ml of duoneb for a months supply he was only sent in 120. Patient also stated that the yupelri was only sent in for a 15 day supply and he needs it for a 30 day supply. Patient got very upset while on the phone and stated that "we are trying to kill him".  BI please advise on refills. Thank you.

## 2018-12-27 NOTE — Telephone Encounter (Signed)
PCCM:  Ok to refill. Please prescribe 90 day supplies of both.   Garner Nash, DO [place] [facility] 12/27/2018 3:09 PM

## 2018-12-27 NOTE — Telephone Encounter (Signed)
Called and spoke with Patient.  Dr Valeta Harms instructions for 90 supply given.  Patiemnt stated he used [employer]. Duo nebs and Yupelri nebs 90 day supply sent to requested [place].  Nothing further at this time.

## 2018-12-28 NOTE — Progress Notes (Addendum)
COMMUNITY PALLIATIVE CARE SW NOTE  PATIENT NAME: [name] DOB: 1946/10/19 MRN: [id number]  PRIMARY CARE PROVIDER: [name], MD  RESPONSIBLE PARTY:  Acct ID - Guarantor Home Phone Work Phone Relationship Acct Type  [id number] [name]  Self P/F     [address], [place], [place] [address]     PLAN OF CARE and INTERVENTIONS:             1. GOALS OF CARE/ ADVANCE CARE PLANNING:  Patient's goal is to remain at home with his wife, [name].  Patient is a full code. 2. SOCIAL/EMOTIONAL/SPIRITUAL ASSESSMENT/ INTERVENTIONS:  SW and Palliative Care RN, [name], met with patient and his wife in their home.  Patient appeared short of breath until the end of the visit.  Provided active listening and supportive counseling while he discussed his hospitalizations and medication issues.  RN to further explore inhaler concerns expressed by patient.  Patient's mother-in-law died recently and his wife was visibly upset when discussing.  SW attempted to discuss alternative methods for relaxation when he becomes anxious.  He said he takes one Xanax a night.  Patient appeared less stressed after the team listening to his concerns.  [name] reported patient stated last night that it was difficult for him to go on when his medications are not filled correctly.  He stated he did not feel this way at the end of the visit. 3. PATIENT/CAREGIVER EDUCATION/ COPING:  Patient copes by expressing his feelings openly.  He will rest when he becomes short of breath. 4. PERSONAL EMERGENCY PLAN:  Patient will inform his wife of medical concerns.  He will contact his daughter also, who is an Therapist, sports. 5. COMMUNITY RESOURCES COORDINATION/ HEALTH CARE NAVIGATION:  None. 6. FINANCIAL/LEGAL CONCERNS/INTERVENTIONS:  None.     SOCIAL HX:  Social History   Tobacco Use  . Smoking status: Former Smoker    Packs/day: 2.00    Years: 45.00    Pack years: 90.00    Types: Cigarettes    Last attempt to quit:  10/17/2015    Years since quitting: 3.2  . Smokeless tobacco: Never Used  Substance Use Topics  . Alcohol use: Yes    Alcohol/week: 0.0 standard drinks    Comment: glass of wine at night    CODE STATUS:  Full Code  ADVANCED DIRECTIVES: N MOST FORM COMPLETE:  N HOSPICE EDUCATION PROVIDED:  N PPS:  Patient said his appetite is returning after his hospitalizations.  Patient ambulates independently. Duration of visit and documentation:  75 minutes.      Andrew Corn Brexlee Heberlein, LCSW

## 2018-12-29 ENCOUNTER — Telehealth: Payer: Self-pay | Admitting: Pulmonary Disease

## 2018-12-29 MED ORDER — IPRATROPIUM-ALBUTEROL 0.5-2.5 (3) MG/3ML IN SOLN: 3.0000 mL | RESPIRATORY_TRACT | 6 refills | Status: DC | PRN

## 2018-12-29 NOTE — Addendum Note (Signed)
Addended by: Georjean Mode on: 12/29/2018 03:42 PM   Modules accepted: Orders

## 2018-12-29 NOTE — Telephone Encounter (Signed)
Claiborne Billings with [facility] in [place] called regarding pt's Duo Neb medication Pt states he should have duoneb medication for his nebulizer Looking over the last AVS from BI    Return in about 2 months (around 02/19/2019).  Thank you for visiting Dr. Valeta Harms at [facility]. Today we recommend the following: No orders of the defined types were placed in this encounter.      Meds ordered this encounter  Medications  . formoterol (PERFOROMIST) 20 MCG/2ML nebulizer solution    Sig: Take 2 mLs (20 mcg total) by nebulization 2 (two) times daily.    Dispense:  2 mL    Refill:  0  . Revefenacin (YUPELRI) 175 MCG/3ML SOLN    Sig: Inhale 3 mLs into the lungs daily.    Dispense:  42 mL    Refill:  0   We are starting you on Perforomist. Inhale 1 vial twice daily.   We are also starting you on Yupelri. Inhale 1 vial once daily.   This is in addition to your current medication.   Return in about 2 months (around 02/19/2019).     Patient is not to be using duoneb. Ambrose Pancoast with [facility] with this info She verbalized understanding, advised she will notify the patient Nothing further needed.

## 2018-12-29 NOTE — Telephone Encounter (Signed)
Note    Called and spoke with Patient. Dr Valeta Harms instructions for 90 supply given. Patient stated he used Aeronautical [employer]. Duo nebs and Yupelri nebs 90 day supply sent to requested [place]. Nothing further at this time.     Placed order for correct amt of Duoneb for patient as he is to remain on duoneb with other neb treatments Talked with Lyn with [employer], she Is aware that we are sending in order today Placed orders for duoneb 362ml 4hrs PRN Nothing further needed.

## 2019-01-01 NOTE — Progress Notes (Signed)
COMMUNITY PALLIATIVE CARE RN NOTE  PATIENT NAME: [name] DOB: [birth year] MRN: [id number]  PRIMARY CARE PROVIDER: [name], MD  RESPONSIBLE PARTY:  Acct ID - Guarantor Home Phone Work Phone Relationship Acct Type  [id number] [name]  Self P/F     [address], [place], [place] [address]    PLAN OF CARE and INTERVENTION:  1. ADVANCE CARE PLANNING/GOALS OF CARE: He wants to remain at home with his wife, and be more active around his home. 2. PATIENT/CAREGIVER EDUCATION: Reinforced Safe Mobility, Energy Conservation and Breathing Techniques 3. DISEASE STATUS: Joint visit made with Palliative Care SW, Lynn Duffy. Met with patient and his wife in their home. Both spoke about recent sudden death of patient's mother in law. Provided active listening and support. Patient provided update regarding his recent TACE and Microwave ablation of his liver. Also he recently received a blood transfusion (3 Units of PRBCs) for a Hgb of 5.7. It is now 8.8 as of last week. Patient had an appointment with his Pulonologist, Dr. Valeta Harms last week. He was placed on 2 new medications Yupelri and Perforomist po BID. Patient has some confusion regarding his Duonebs and Yupelri. He still has 7 days left of his samples of Yupelri given to him at MD office. Contacted Dr. Juline Patch office to inquire about both medications as patient only received 1/2 supply of his Duonebs and Maretta Bees was not sent because script was only for a 14 day supply and Medicare will only cover a 30 day supply.  A 90 day supply was approved by Pulmonologist and new script to be faxed to [facility]). Patient is anxious this visit, but feels better that new scripts will be faxed. He remains able to perform ADLs independently. Requires frequent rest periods due to dyspnea at rest and during conversation. He is on 6L/min via [place]. He also wears a BiPap at night. His intake has decreased overall, but states  has been improving over the past few days. Will continue to monitor.   HISTORY OF PRESENT ILLNESS:  This is a 73 yo male who resides at home with his wife. Palliative Care Team continues to follow patient. Team to continue to visit monthly and PRN.   CODE STATUS: Full Code  ADVANCED DIRECTIVES: N MOST FORM: no PPS: 50%   PHYSICAL EXAM:   VITALS: Today's Vitals   12/27/18 1322  BP: 130/62  Pulse: 85  Resp: (!) 24  SpO2: 98%  PainSc: 0-No pain    LUNGS: clear to auscultation , dyspnea at rest and with exertion CARDIAC: Cor RRR EXTREMITIES: No edema SKIN: Exposed skin is dry and intact, denies any skin breakdown  NEURO: Alert and oriented x 3, anxious, ambulatory without assistive devices   (Duration of visit and documentation 90 minutes)    [name], RN, BSN

## 2019-01-12 DIAGNOSIS — C494 Malignant neoplasm of connective and soft tissue of abdomen: Secondary | ICD-10-CM | POA: Diagnosis not present

## 2019-01-12 DIAGNOSIS — K7689 Other specified diseases of liver: Secondary | ICD-10-CM | POA: Diagnosis not present

## 2019-01-12 DIAGNOSIS — R911 Solitary pulmonary nodule: Secondary | ICD-10-CM | POA: Diagnosis not present

## 2019-01-16 DIAGNOSIS — E7800 Pure hypercholesterolemia, unspecified: Secondary | ICD-10-CM | POA: Diagnosis not present

## 2019-01-16 DIAGNOSIS — Z8551 Personal history of malignant neoplasm of bladder: Secondary | ICD-10-CM | POA: Diagnosis not present

## 2019-01-16 DIAGNOSIS — I10 Essential (primary) hypertension: Secondary | ICD-10-CM | POA: Diagnosis not present

## 2019-01-16 DIAGNOSIS — N400 Enlarged prostate without lower urinary tract symptoms: Secondary | ICD-10-CM | POA: Diagnosis not present

## 2019-01-16 DIAGNOSIS — E118 Type 2 diabetes mellitus with unspecified complications: Secondary | ICD-10-CM | POA: Diagnosis not present

## 2019-01-16 DIAGNOSIS — C494 Malignant neoplasm of connective and soft tissue of abdomen: Secondary | ICD-10-CM | POA: Diagnosis not present

## 2019-01-16 DIAGNOSIS — Z87891 Personal history of nicotine dependence: Secondary | ICD-10-CM | POA: Diagnosis not present

## 2019-01-16 DIAGNOSIS — R918 Other nonspecific abnormal finding of lung field: Secondary | ICD-10-CM | POA: Diagnosis not present

## 2019-01-16 DIAGNOSIS — C499 Malignant neoplasm of connective and soft tissue, unspecified: Secondary | ICD-10-CM | POA: Diagnosis not present

## 2019-01-16 DIAGNOSIS — I2510 Atherosclerotic heart disease of native coronary artery without angina pectoris: Secondary | ICD-10-CM | POA: Diagnosis not present

## 2019-01-16 DIAGNOSIS — Z79899 Other long term (current) drug therapy: Secondary | ICD-10-CM | POA: Diagnosis not present

## 2019-01-16 DIAGNOSIS — J449 Chronic obstructive pulmonary disease, unspecified: Secondary | ICD-10-CM | POA: Diagnosis not present

## 2019-01-17 DIAGNOSIS — D500 Iron deficiency anemia secondary to blood loss (chronic): Secondary | ICD-10-CM | POA: Diagnosis not present

## 2019-01-19 ENCOUNTER — Telehealth: Payer: Self-pay | Admitting: Pulmonary Disease

## 2019-01-19 MED ORDER — IPRATROPIUM-ALBUTEROL 0.5-2.5 (3) MG/3ML IN SOLN: 3.0000 mL | RESPIRATORY_TRACT | 6 refills | Status: DC | PRN

## 2019-01-19 NOTE — Telephone Encounter (Signed)
PCCM:  I called and spoke with Dr. Merrie Roof. Patient expressed being upset regarding no longer having duonebs. Patient was started on long acting nebs yupelri and perforomist. Per Dr Merrie Roof the patient only used for one week and self discontinued.   At this point, ok to continue duonebs for symptom management. That is ok from my stand point.   Garner Nash, DO [facility] 01/19/2019 12:43 PM

## 2019-01-19 NOTE — Telephone Encounter (Signed)
Called and spoke with patient he would like a refill sent to [place]. Refill printed, signed and given to [facility].   Nothing further needed.

## 2019-01-19 NOTE — Telephone Encounter (Signed)
Dr. Valeta Harms please call this MD over at [place] Dr. Merrie Roof. Patient is upset not understanding why some medications were d/c'd by you.  Dr. Merrie Roof says not rush call at your convenience. [phone] this is her cell and pager.

## 2019-01-19 NOTE — Telephone Encounter (Signed)
Rx faxed to [facility] which is who [facility] uses.

## 2019-01-19 NOTE — Telephone Encounter (Signed)
Patient called back asking please do not send to [facility] as they will not send off the Rx for him. He said [facility].  Routing to [facility] as they have the RX

## 2019-01-22 DIAGNOSIS — J449 Chronic obstructive pulmonary disease, unspecified: Secondary | ICD-10-CM | POA: Diagnosis not present

## 2019-01-22 DIAGNOSIS — Z6836 Body mass index (BMI) 36.0-36.9, adult: Secondary | ICD-10-CM | POA: Diagnosis not present

## 2019-01-22 DIAGNOSIS — F419 Anxiety disorder, unspecified: Secondary | ICD-10-CM | POA: Diagnosis not present

## 2019-01-22 DIAGNOSIS — C499 Malignant neoplasm of connective and soft tissue, unspecified: Secondary | ICD-10-CM | POA: Diagnosis not present

## 2019-01-22 DIAGNOSIS — D62 Acute posthemorrhagic anemia: Secondary | ICD-10-CM | POA: Diagnosis not present

## 2019-01-25 ENCOUNTER — Telehealth: Payer: Self-pay | Admitting: *Deleted

## 2019-01-25 NOTE — Telephone Encounter (Signed)
Contacted and spoke with patient to arrange a home visit. Visit scheduled for 02/09/19 at [place].

## 2019-01-26 DIAGNOSIS — Z8551 Personal history of malignant neoplasm of bladder: Secondary | ICD-10-CM | POA: Diagnosis not present

## 2019-01-26 DIAGNOSIS — D649 Anemia, unspecified: Secondary | ICD-10-CM | POA: Diagnosis not present

## 2019-01-26 DIAGNOSIS — Z9181 History of falling: Secondary | ICD-10-CM | POA: Diagnosis not present

## 2019-01-26 DIAGNOSIS — D6489 Other specified anemias: Secondary | ICD-10-CM | POA: Diagnosis not present

## 2019-01-26 DIAGNOSIS — Z79891 Long term (current) use of opiate analgesic: Secondary | ICD-10-CM | POA: Diagnosis not present

## 2019-01-26 DIAGNOSIS — J449 Chronic obstructive pulmonary disease, unspecified: Secondary | ICD-10-CM | POA: Diagnosis not present

## 2019-01-26 DIAGNOSIS — F419 Anxiety disorder, unspecified: Secondary | ICD-10-CM | POA: Diagnosis not present

## 2019-01-26 DIAGNOSIS — F418 Other specified anxiety disorders: Secondary | ICD-10-CM | POA: Diagnosis not present

## 2019-01-26 DIAGNOSIS — E785 Hyperlipidemia, unspecified: Secondary | ICD-10-CM | POA: Diagnosis not present

## 2019-01-26 DIAGNOSIS — I10 Essential (primary) hypertension: Secondary | ICD-10-CM | POA: Diagnosis not present

## 2019-01-26 DIAGNOSIS — E7849 Other hyperlipidemia: Secondary | ICD-10-CM | POA: Diagnosis not present

## 2019-01-26 DIAGNOSIS — I272 Pulmonary hypertension, unspecified: Secondary | ICD-10-CM | POA: Diagnosis not present

## 2019-01-26 DIAGNOSIS — C499 Malignant neoplasm of connective and soft tissue, unspecified: Secondary | ICD-10-CM | POA: Diagnosis not present

## 2019-01-26 DIAGNOSIS — I2789 Other specified pulmonary heart diseases: Secondary | ICD-10-CM | POA: Diagnosis not present

## 2019-01-28 DIAGNOSIS — I10 Essential (primary) hypertension: Secondary | ICD-10-CM | POA: Diagnosis not present

## 2019-01-28 DIAGNOSIS — C499 Malignant neoplasm of connective and soft tissue, unspecified: Secondary | ICD-10-CM | POA: Diagnosis not present

## 2019-01-28 DIAGNOSIS — D649 Anemia, unspecified: Secondary | ICD-10-CM | POA: Diagnosis not present

## 2019-01-28 DIAGNOSIS — J449 Chronic obstructive pulmonary disease, unspecified: Secondary | ICD-10-CM | POA: Diagnosis not present

## 2019-01-28 DIAGNOSIS — F419 Anxiety disorder, unspecified: Secondary | ICD-10-CM | POA: Diagnosis not present

## 2019-01-28 DIAGNOSIS — I272 Pulmonary hypertension, unspecified: Secondary | ICD-10-CM | POA: Diagnosis not present

## 2019-01-30 ENCOUNTER — Other Ambulatory Visit: Payer: Medicare Other | Admitting: Licensed Clinical Social Worker

## 2019-01-30 ENCOUNTER — Other Ambulatory Visit: Payer: Medicare Other | Admitting: *Deleted

## 2019-01-30 ENCOUNTER — Other Ambulatory Visit: Payer: Self-pay

## 2019-01-30 DIAGNOSIS — Z515 Encounter for palliative care: Secondary | ICD-10-CM

## 2019-01-30 NOTE — Progress Notes (Signed)
COMMUNITY PALLIATIVE CARE RN NOTE  PATIENT NAME: [name] DOB: 01/20/1946 MRN: [id number]  PRIMARY CARE PROVIDER: [name], MD  RESPONSIBLE PARTY:  Acct ID - Guarantor Home Phone Work Phone Relationship Acct Type  [id number] [name]  Self P/F     [place], [place], [place] [address]    PLAN OF CARE and INTERVENTION:  1. ADVANCE CARE PLANNING/GOALS OF CARE: Goal is to remain at home with his wife. He is a Full Code. 2. PATIENT/CAREGIVER EDUCATION: Reinforced Safe Mobility, Breathing Techniques and Energy Conservation 3. DISEASE STATUS: Joint visit made with Palliative Care SW, Lynn Duffy. Met with patient and his wife in their home. Patient denies pain. Patient does admit to some soreness in his R abdomen if he lies on this area the wrong way. He continues with some dyspnea with exertion and during conversation, but has improved since visit last month. He reports that he has not been utilizing Yupelri and Perforomist, as he feels it causes his HR to become elevated in the low to mid 100s. He would like these medications to be discontinued. He has been utilizing the Budesonide BID and Duonebs QID. He is O2 dependent and is currently on 6L/min. He continues with a Bi-pap during the night. He takes Xanax once daily. His intake is variable. He states that his appetite has decreased overall, but at times he does feel hunger. He remains independent with all ADLs. He is now working with a Physical Therapist 2x/week for strengthening and endurance. She visited last week for an evaluation and first session was Sunday. His abdomen is firm and distended, but he denies constipation. No blood noticed with BMs. Bowel sounds present in each quadrant. He is continent of both bowel and bladder. His next appointment with his Pulmonologist is 03/08/19. Will continue to monitor.   HISTORY OF PRESENT ILLNESS:  This is a 73 yo male who resides at home with his wife. Palliative Care Team  continues to follow patient. Next visit scheduled in 1 month.  CODE STATUS: Full Code ADVANCED DIRECTIVES: N MOST FORM: no PPS: 50%   PHYSICAL EXAM:   VITALS: Today's Vitals   01/30/19 1108  BP: 120/67  Pulse: 96  Resp: 20  Temp: 98.1 F (36.7 C)  TempSrc: Temporal  SpO2: 97%  PainSc: 0-No pain    LUNGS: decreased breath sounds CARDIAC: Cor RRR EXTREMITIES: No edema SKIN: Exposed skin is dry and intact; denies any skin breakdown  NEURO: Alert and oriented x 3, generalized weakness, ambulatory   (Duration of visit and documentation 75 minutes)    [name], RN BSN

## 2019-01-31 NOTE — Progress Notes (Signed)
COMMUNITY PALLIATIVE CARE SW NOTE  PATIENT NAME: [name] DOB: 1946/05/04 MRN: [id number]  PRIMARY CARE PROVIDER: [name], MD  RESPONSIBLE PARTY:  Acct ID - Guarantor Home Phone Work Phone Relationship Acct Type  [id number] [name]  Self P/F     [address], [place], [place] [address]     PLAN OF CARE and INTERVENTIONS:             1. GOALS OF CARE/ ADVANCE CARE PLANNING:  Goal is for patient to remain at home with his wife, [name].  Patient remains a full code. 2. SOCIAL/EMOTIONAL/SPIRITUAL ASSESSMENT/ INTERVENTIONS:  SW and Palliative Care RN, [name], met with patient and his wife in their home.  Patient was wearing his O2 and stood during half of the visit.  He reports breathing better in this position.  He denied pain.  SW provided active listening and supportive counseling while he discussed his breathing medication regimen.  The couple discussed family relationship concerns also.  Although he is prescribed more, hee continues taking one anti-anxiety medication pill a day, which does not appear to be effective at times. 3. PATIENT/CAREGIVER EDUCATION/ COPING:  Patient copes by expressing his feelings openly. 4. PERSONAL EMERGENCY PLAN:  He will let his wife know if there is an emergency.  He informs his daughter who is a Marine scientist also. 5. COMMUNITY RESOURCES COORDINATION/ HEALTH CARE NAVIGATION:  No 6. FINANCIAL/LEGAL CONCERNS/INTERVENTIONS:  No     SOCIAL HX:  Social History   Tobacco Use  . Smoking status: Former Smoker    Packs/day: 2.00    Years: 45.00    Pack years: 90.00    Types: Cigarettes    Last attempt to quit: 10/17/2015    Years since quitting: 3.2  . Smokeless tobacco: Never Used  Substance Use Topics  . Alcohol use: Yes    Alcohol/week: 0.0 standard drinks    Comment: glass of wine at night    CODE STATUS: Full Code  ADVANCED DIRECTIVES: N MOST FORM COMPLETE:  N HOSPICE EDUCATION PROVIDED:  N PPS:  Patient's appetite  still varies since his hospitalization.  He ambulates independently. Duration of visit and documentation:  75 minutes.      Andrew Corn Leny Morozov, LCSW

## 2019-02-02 DIAGNOSIS — C499 Malignant neoplasm of connective and soft tissue, unspecified: Secondary | ICD-10-CM | POA: Diagnosis not present

## 2019-02-02 DIAGNOSIS — J449 Chronic obstructive pulmonary disease, unspecified: Secondary | ICD-10-CM | POA: Diagnosis not present

## 2019-02-02 DIAGNOSIS — F419 Anxiety disorder, unspecified: Secondary | ICD-10-CM | POA: Diagnosis not present

## 2019-02-02 DIAGNOSIS — D649 Anemia, unspecified: Secondary | ICD-10-CM | POA: Diagnosis not present

## 2019-02-02 DIAGNOSIS — I10 Essential (primary) hypertension: Secondary | ICD-10-CM | POA: Diagnosis not present

## 2019-02-02 DIAGNOSIS — I272 Pulmonary hypertension, unspecified: Secondary | ICD-10-CM | POA: Diagnosis not present

## 2019-02-04 DIAGNOSIS — I10 Essential (primary) hypertension: Secondary | ICD-10-CM | POA: Diagnosis not present

## 2019-02-04 DIAGNOSIS — C499 Malignant neoplasm of connective and soft tissue, unspecified: Secondary | ICD-10-CM | POA: Diagnosis not present

## 2019-02-04 DIAGNOSIS — J449 Chronic obstructive pulmonary disease, unspecified: Secondary | ICD-10-CM | POA: Diagnosis not present

## 2019-02-04 DIAGNOSIS — I272 Pulmonary hypertension, unspecified: Secondary | ICD-10-CM | POA: Diagnosis not present

## 2019-02-04 DIAGNOSIS — F419 Anxiety disorder, unspecified: Secondary | ICD-10-CM | POA: Diagnosis not present

## 2019-02-04 DIAGNOSIS — D649 Anemia, unspecified: Secondary | ICD-10-CM | POA: Diagnosis not present

## 2019-02-12 ENCOUNTER — Encounter: Payer: Self-pay | Admitting: Adult Health

## 2019-02-12 ENCOUNTER — Other Ambulatory Visit: Payer: Self-pay

## 2019-02-12 ENCOUNTER — Ambulatory Visit (INDEPENDENT_AMBULATORY_CARE_PROVIDER_SITE_OTHER): Payer: Medicare Other | Admitting: Adult Health

## 2019-02-12 DIAGNOSIS — J449 Chronic obstructive pulmonary disease, unspecified: Secondary | ICD-10-CM | POA: Diagnosis not present

## 2019-02-12 DIAGNOSIS — J9611 Chronic respiratory failure with hypoxia: Secondary | ICD-10-CM

## 2019-02-12 MED ORDER — BUDESONIDE 0.25 MG/2ML IN SUSP: 0.2500 mg | Freq: Two times a day (BID) | RESPIRATORY_TRACT | 5 refills | Status: AC

## 2019-02-12 MED ORDER — IPRATROPIUM-ALBUTEROL 0.5-2.5 (3) MG/3ML IN SOLN: 3.0000 mL | RESPIRATORY_TRACT | 6 refills | Status: AC | PRN

## 2019-02-12 NOTE — Patient Instructions (Addendum)
Stop Yupelri and Perforomist  Continue Budesonide Neb Twice daily   Restart Duoneb Four times a day  .  Continue on Oxygen 4l/m rest and 6 l/m with activity .  Continue on BIPAP At bedtime  With oxygen .  Follow in 1 month for Televisit and As needed   Please contact office for sooner follow up if symptoms do not improve or worsen or seek emergency care

## 2019-02-12 NOTE — Progress Notes (Signed)
Virtual Visit via Telephone Note  I connected with ABASS MISENER on 02/12/19 at 10:30 AM EDT by telephone and verified that I am speaking with the correct person using two identifiers.   I discussed the limitations, risks, security and privacy concerns of performing an evaluation and management service by telephone and the availability of in person appointments. I also discussed with the patient that there may be a patient responsible charge related to this service. The patient expressed understanding and agreed to proceed.   History of Present Illness: 73 year old male with COPD GOLD III , chronic diastolic heart failure, chronic hypoxic respiratory failure on home oxygen.  He is on oxygen 4 L at baseline and 6 L with activity.  He uses BiPAP at night. 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  He is followed by [facility] oncology.  Underwent a TACE procedure for liver metastasis Patient has hypertension and coronary artery disease with previous stents.   Today's visit is a Televisit for 6 week follow up for COPD  And O2 RF.  Last visit established with Dr. Valeta Harms . He was changed from Duoneb to [place] and [employer] he could not tolerate the new nebs, they gave him tachycardia and made him feel bad. He stopped them and changed back to Duoneb.  Patient is frustrated that this has not been changed. He has made several calls to our office. Long discussion with him with support provided. We will call [facility] to cancel the recent neb and new prescription for duoneb.  Appetite is fair. No n//vd/. .  Remains on Oxygen 4l/m rest and 6l/m activity . Uses BIPAP with oxygen At bedtime  ,. Never misses a night.   Denies any increased leg swelling , no increased oxygen demands.   Following with [facility] oncology , currently looking oral chemo options.     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  DME updated.   D CHF- appears to be under control without obvious flare  Cont w/ labs with PCP   O2 RF -no increased demands.  Continue on BIPAP At bedtime    Plan  Stop Yupelri and Perforomist  Continue Budesonide Neb Twice daily   Restart Duoneb Four times a day  .  Continue on Oxygen 4l/m rest and 6 l/m with activity .  Continue on BIPAP At bedtime  With oxygen  Follow in 1 month for Televisit and As needed   Please contact office for sooner follow up if symptoms do not improve or worsen or seek emergency care      Follow Up Instructions:   Follow in 1 month for Televisit and As needed   Please contact office for sooner follow up if symptoms do not improve or worsen or seek emergency care  I discussed the assessment and treatment plan with the patient. The patient was provided an opportunity to ask questions and all were answered. The patient agreed with the plan and demonstrated an understanding of the instructions.   The patient was advised to call back or seek an in-person evaluation if the symptoms worsen or if the condition fails to improve as anticipated.  I provided 18  minutes of non-face-to-face time during this encounter. Patient patient was present for today's visit at home, myself  was present for today's visit at office  Rexene Edison, NP

## 2019-02-14 NOTE — Progress Notes (Signed)
PCCM: Thanks for calling him. I spoke with his oncologist at [facility] a few weeks ago via phone.  Garner Nash, DO [facility] 02/14/2019 11:58 AM

## 2019-02-18 ENCOUNTER — Other Ambulatory Visit: Payer: Self-pay

## 2019-02-18 ENCOUNTER — Encounter (HOSPITAL_COMMUNITY): Payer: Self-pay

## 2019-02-18 ENCOUNTER — Emergency Department (HOSPITAL_COMMUNITY): Payer: Medicare Other

## 2019-02-18 ENCOUNTER — Inpatient Hospital Stay (HOSPITAL_COMMUNITY)
Admission: EM | Admit: 2019-02-18 | Discharge: 2019-02-21 | DRG: 378 | Disposition: A | Discharge status: Home / Self Care | Payer: Medicare Other | Attending: Internal Medicine | Admitting: Internal Medicine

## 2019-02-18 DIAGNOSIS — Z515 Encounter for palliative care: Secondary | ICD-10-CM | POA: Diagnosis not present

## 2019-02-18 DIAGNOSIS — Z888 Allergy status to other drugs, medicaments and biological substances status: Secondary | ICD-10-CM

## 2019-02-18 DIAGNOSIS — Z8249 Family history of ischemic heart disease and other diseases of the circulatory system: Secondary | ICD-10-CM

## 2019-02-18 DIAGNOSIS — J439 Emphysema, unspecified: Secondary | ICD-10-CM | POA: Diagnosis present

## 2019-02-18 DIAGNOSIS — I5032 Chronic diastolic (congestive) heart failure: Secondary | ICD-10-CM | POA: Diagnosis present

## 2019-02-18 DIAGNOSIS — R7301 Impaired fasting glucose: Secondary | ICD-10-CM | POA: Diagnosis present

## 2019-02-18 DIAGNOSIS — E785 Hyperlipidemia, unspecified: Secondary | ICD-10-CM | POA: Diagnosis present

## 2019-02-18 DIAGNOSIS — K922 Gastrointestinal hemorrhage, unspecified: Secondary | ICD-10-CM | POA: Diagnosis not present

## 2019-02-18 DIAGNOSIS — Z9981 Dependence on supplemental oxygen: Secondary | ICD-10-CM | POA: Diagnosis not present

## 2019-02-18 DIAGNOSIS — J9612 Chronic respiratory failure with hypercapnia: Secondary | ICD-10-CM | POA: Diagnosis present

## 2019-02-18 DIAGNOSIS — C7801 Secondary malignant neoplasm of right lung: Secondary | ICD-10-CM | POA: Diagnosis present

## 2019-02-18 DIAGNOSIS — R0689 Other abnormalities of breathing: Secondary | ICD-10-CM | POA: Diagnosis not present

## 2019-02-18 DIAGNOSIS — G4733 Obstructive sleep apnea (adult) (pediatric): Secondary | ICD-10-CM | POA: Diagnosis present

## 2019-02-18 DIAGNOSIS — Z87891 Personal history of nicotine dependence: Secondary | ICD-10-CM

## 2019-02-18 DIAGNOSIS — J9611 Chronic respiratory failure with hypoxia: Secondary | ICD-10-CM | POA: Diagnosis present

## 2019-02-18 DIAGNOSIS — R778 Other specified abnormalities of plasma proteins: Secondary | ICD-10-CM

## 2019-02-18 DIAGNOSIS — D649 Anemia, unspecified: Secondary | ICD-10-CM

## 2019-02-18 DIAGNOSIS — C762 Malignant neoplasm of abdomen: Secondary | ICD-10-CM | POA: Diagnosis present

## 2019-02-18 DIAGNOSIS — D500 Iron deficiency anemia secondary to blood loss (chronic): Secondary | ICD-10-CM | POA: Diagnosis not present

## 2019-02-18 DIAGNOSIS — R000 Tachycardia, unspecified: Secondary | ICD-10-CM | POA: Diagnosis not present

## 2019-02-18 DIAGNOSIS — Z79899 Other long term (current) drug therapy: Secondary | ICD-10-CM

## 2019-02-18 DIAGNOSIS — I110 Hypertensive heart disease with heart failure: Secondary | ICD-10-CM | POA: Diagnosis present

## 2019-02-18 DIAGNOSIS — D62 Acute posthemorrhagic anemia: Secondary | ICD-10-CM | POA: Diagnosis present

## 2019-02-18 DIAGNOSIS — R0902 Hypoxemia: Secondary | ICD-10-CM | POA: Diagnosis not present

## 2019-02-18 DIAGNOSIS — R7989 Other specified abnormal findings of blood chemistry: Secondary | ICD-10-CM | POA: Diagnosis present

## 2019-02-18 DIAGNOSIS — C7802 Secondary malignant neoplasm of left lung: Secondary | ICD-10-CM | POA: Diagnosis present

## 2019-02-18 DIAGNOSIS — R0602 Shortness of breath: Secondary | ICD-10-CM

## 2019-02-18 DIAGNOSIS — K5731 Diverticulosis of large intestine without perforation or abscess with bleeding: Secondary | ICD-10-CM | POA: Diagnosis present

## 2019-02-18 DIAGNOSIS — Z6834 Body mass index (BMI) 34.0-34.9, adult: Secondary | ICD-10-CM

## 2019-02-18 DIAGNOSIS — I2510 Atherosclerotic heart disease of native coronary artery without angina pectoris: Secondary | ICD-10-CM | POA: Diagnosis present

## 2019-02-18 DIAGNOSIS — Z955 Presence of coronary angioplasty implant and graft: Secondary | ICD-10-CM

## 2019-02-18 DIAGNOSIS — Z86010 Personal history of colonic polyps: Secondary | ICD-10-CM

## 2019-02-18 DIAGNOSIS — R740 Nonspecific elevation of levels of transaminase and lactic acid dehydrogenase [LDH]: Secondary | ICD-10-CM | POA: Diagnosis present

## 2019-02-18 DIAGNOSIS — C7911 Secondary malignant neoplasm of bladder: Secondary | ICD-10-CM | POA: Diagnosis present

## 2019-02-18 DIAGNOSIS — Z7189 Other specified counseling: Secondary | ICD-10-CM | POA: Diagnosis not present

## 2019-02-18 DIAGNOSIS — I420 Dilated cardiomyopathy: Secondary | ICD-10-CM | POA: Diagnosis present

## 2019-02-18 DIAGNOSIS — R109 Unspecified abdominal pain: Secondary | ICD-10-CM | POA: Diagnosis not present

## 2019-02-18 DIAGNOSIS — K625 Hemorrhage of anus and rectum: Secondary | ICD-10-CM | POA: Diagnosis not present

## 2019-02-18 DIAGNOSIS — C787 Secondary malignant neoplasm of liver and intrahepatic bile duct: Secondary | ICD-10-CM | POA: Diagnosis present

## 2019-02-18 DIAGNOSIS — R69 Illness, unspecified: Secondary | ICD-10-CM

## 2019-02-18 DIAGNOSIS — Z66 Do not resuscitate: Secondary | ICD-10-CM | POA: Diagnosis present

## 2019-02-18 DIAGNOSIS — F419 Anxiety disorder, unspecified: Secondary | ICD-10-CM | POA: Diagnosis present

## 2019-02-18 DIAGNOSIS — C499 Malignant neoplasm of connective and soft tissue, unspecified: Secondary | ICD-10-CM | POA: Diagnosis not present

## 2019-02-18 DIAGNOSIS — R58 Hemorrhage, not elsewhere classified: Secondary | ICD-10-CM | POA: Diagnosis not present

## 2019-02-18 DIAGNOSIS — C801 Malignant (primary) neoplasm, unspecified: Secondary | ICD-10-CM | POA: Diagnosis not present

## 2019-02-18 DIAGNOSIS — Z7951 Long term (current) use of inhaled steroids: Secondary | ICD-10-CM

## 2019-02-18 LAB — URINALYSIS, ROUTINE W REFLEX MICROSCOPIC
Bilirubin Urine: NEGATIVE
Glucose, UA: NEGATIVE mg/dL
Hgb urine dipstick: NEGATIVE
Ketones, ur: 5 mg/dL — AB
Leukocytes,Ua: NEGATIVE
Nitrite: NEGATIVE
Protein, ur: NEGATIVE mg/dL
Specific Gravity, Urine: 1.02 (ref 1.005–1.030)
pH: 5 (ref 5.0–8.0)

## 2019-02-18 LAB — LACTIC ACID, PLASMA
Lactic Acid, Venous: 2.4 mmol/L (ref 0.5–1.9)
Lactic Acid, Venous: 1.7 mmol/L (ref 0.5–1.9)

## 2019-02-18 LAB — CBC WITH DIFFERENTIAL/PLATELET
Abs Immature Granulocytes: 0.5 10*3/uL — ABNORMAL HIGH (ref 0.00–0.07)
Basophils Absolute: 0 10*3/uL (ref 0.0–0.1)
Basophils Relative: 0 %
Eosinophils Absolute: 0.1 10*3/uL (ref 0.0–0.5)
Eosinophils Relative: 1 %
HCT: 14.5 % — ABNORMAL LOW (ref 39.0–52.0)
Hemoglobin: 4.4 g/dL — CL (ref 13.0–17.0)
Immature Granulocytes: 6 %
Lymphocytes Relative: 9 %
Lymphs Abs: 0.8 10*3/uL (ref 0.7–4.0)
MCH: 31.2 pg (ref 26.0–34.0)
MCHC: 30.3 g/dL (ref 30.0–36.0)
MCV: 102.8 fL — ABNORMAL HIGH (ref 80.0–100.0)
Monocytes Absolute: 0.8 10*3/uL (ref 0.1–1.0)
Monocytes Relative: 8 %
Neutro Abs: 6.8 10*3/uL (ref 1.7–7.7)
Neutrophils Relative %: 76 %
Platelets: 191 10*3/uL (ref 150–400)
RBC: 1.41 MIL/uL — ABNORMAL LOW (ref 4.22–5.81)
RDW: 22.2 % — ABNORMAL HIGH (ref 11.5–15.5)
WBC: 9 10*3/uL (ref 4.0–10.5)
nRBC: 1.3 % — ABNORMAL HIGH (ref 0.0–0.2)

## 2019-02-18 LAB — COMPREHENSIVE METABOLIC PANEL
ALT: 23 U/L (ref 0–44)
AST: 46 U/L — ABNORMAL HIGH (ref 15–41)
Albumin: 3.2 g/dL — ABNORMAL LOW (ref 3.5–5.0)
Alkaline Phosphatase: 51 U/L (ref 38–126)
Anion gap: 10 (ref 5–15)
BUN: 28 mg/dL — ABNORMAL HIGH (ref 8–23)
CO2: 21 mmol/L — ABNORMAL LOW (ref 22–32)
Calcium: 8.1 mg/dL — ABNORMAL LOW (ref 8.9–10.3)
Chloride: 104 mmol/L (ref 98–111)
Creatinine, Ser: 0.93 mg/dL (ref 0.61–1.24)
GFR calc Af Amer: >60 mL/min (ref >60)
GFR calc non Af Amer: >60 mL/min (ref >60)
Glucose, Bld: 181 mg/dL — ABNORMAL HIGH (ref 70–99)
Potassium: 4 mmol/L (ref 3.5–5.1)
Sodium: 135 mmol/L (ref 135–145)
Total Bilirubin: 2.1 mg/dL — ABNORMAL HIGH (ref 0.3–1.2)
Total Protein: 6.3 g/dL — ABNORMAL LOW (ref 6.5–8.1)

## 2019-02-18 LAB — BRAIN NATRIURETIC PEPTIDE: B Natriuretic Peptide: 215.3 pg/mL — ABNORMAL HIGH (ref 0.0–100.0)

## 2019-02-18 LAB — GLUCOSE, CAPILLARY
Glucose-Capillary: 125 mg/dL — ABNORMAL HIGH (ref 70–99)
Glucose-Capillary: 152 mg/dL — ABNORMAL HIGH (ref 70–99)

## 2019-02-18 LAB — TROPONIN I
Troponin I: 1.47 ng/mL (ref <0.03)
Troponin I: 2.71 ng/mL (ref <0.03)

## 2019-02-18 LAB — PROTIME-INR
INR: 1.1 (ref 0.8–1.2)
Prothrombin Time: 14.2 seconds (ref 11.4–15.2)

## 2019-02-18 LAB — LIPASE, BLOOD: Lipase: 27 U/L (ref 11–51)

## 2019-02-18 LAB — POC OCCULT BLOOD, ED: Fecal Occult Bld: POSITIVE — AB

## 2019-02-18 LAB — PREPARE RBC (CROSSMATCH)

## 2019-02-18 MED ORDER — ALBUTEROL SULFATE HFA 108 (90 BASE) MCG/ACT IN AERS
4.0000 | INHALATION_SPRAY | Freq: Once | RESPIRATORY_TRACT | Status: AC
  Administered 2019-02-18: 4 via RESPIRATORY_TRACT
  Filled 2019-02-18: qty 6.7

## 2019-02-18 MED ORDER — ISOSORBIDE MONONITRATE ER 30 MG PO TB24: 30.0000 mg | ORAL_TABLET | Freq: Every day | ORAL | Status: DC

## 2019-02-18 MED ORDER — VITAMIN B-12 1000 MCG PO TABS
1000.0000 ug | ORAL_TABLET | Freq: Every day | ORAL | Status: DC
  Administered 2019-02-18 – 2019-02-21 (×4): 1000 ug via ORAL
  Filled 2019-02-18 (×4): qty 1

## 2019-02-18 MED ORDER — FLUTICASONE PROPIONATE 50 MCG/ACT NA SUSP: 2.0000 | Freq: Every day | NASAL | Status: DC | PRN

## 2019-02-18 MED ORDER — SODIUM CHLORIDE 0.9 % IV SOLN
8.0000 mg/h | INTRAVENOUS | Status: AC
  Administered 2019-02-18 – 2019-02-20 (×4): 8 mg/h via INTRAVENOUS
  Filled 2019-02-18 (×6): qty 80

## 2019-02-18 MED ORDER — SIMVASTATIN 10 MG PO TABS
20.0000 mg | ORAL_TABLET | Freq: Every day | ORAL | Status: DC
  Administered 2019-02-18 – 2019-02-20 (×3): 20 mg via ORAL
  Filled 2019-02-18 (×4): qty 2

## 2019-02-18 MED ORDER — IPRATROPIUM BROMIDE HFA 17 MCG/ACT IN AERS
2.0000 | INHALATION_SPRAY | Freq: Once | RESPIRATORY_TRACT | Status: AC
  Administered 2019-02-18: 2 via RESPIRATORY_TRACT
  Filled 2019-02-18: qty 12.9

## 2019-02-18 MED ORDER — IPRATROPIUM-ALBUTEROL 0.5-2.5 (3) MG/3ML IN SOLN
3.0000 mL | Freq: Four times a day (QID) | RESPIRATORY_TRACT | Status: DC
  Administered 2019-02-18 – 2019-02-19 (×2): 3 mL via RESPIRATORY_TRACT
  Filled 2019-02-18 (×2): qty 3

## 2019-02-18 MED ORDER — INSULIN ASPART 100 UNIT/ML ~~LOC~~ SOLN: 0.0000 [IU] | Freq: Every day | SUBCUTANEOUS | Status: DC

## 2019-02-18 MED ORDER — ALPRAZOLAM 0.5 MG PO TABS
0.5000 mg | ORAL_TABLET | Freq: Every day | ORAL | Status: DC
  Administered 2019-02-18 – 2019-02-20 (×3): 0.5 mg via ORAL
  Filled 2019-02-18 (×3): qty 1

## 2019-02-18 MED ORDER — CHOLECALCIFEROL 10 MCG (400 UNIT) PO TABS
400.0000 [IU] | ORAL_TABLET | Freq: Every day | ORAL | Status: DC
  Administered 2019-02-18 – 2019-02-20 (×3): 400 [IU] via ORAL
  Filled 2019-02-18 (×3): qty 1

## 2019-02-18 MED ORDER — METHOCARBAMOL 500 MG PO TABS
500.0000 mg | ORAL_TABLET | Freq: Once | ORAL | Status: AC
  Administered 2019-02-18: 500 mg via ORAL
  Filled 2019-02-18: qty 1

## 2019-02-18 MED ORDER — BUDESONIDE 0.25 MG/2ML IN SUSP
0.2500 mg | Freq: Two times a day (BID) | RESPIRATORY_TRACT | Status: DC
  Administered 2019-02-18 – 2019-02-19 (×3): 0.25 mg via RESPIRATORY_TRACT
  Filled 2019-02-18 (×3): qty 2

## 2019-02-18 MED ORDER — SODIUM CHLORIDE 0.9% IV SOLUTION: Freq: Once | INTRAVENOUS | Status: DC

## 2019-02-18 MED ORDER — ONDANSETRON HCL 4 MG/2ML IJ SOLN: 4.0000 mg | Freq: Four times a day (QID) | INTRAMUSCULAR | Status: DC | PRN

## 2019-02-18 MED ORDER — SODIUM CHLORIDE 0.9 % IV SOLN
80.0000 mg | Freq: Once | INTRAVENOUS | Status: AC
  Administered 2019-02-18: 80 mg via INTRAVENOUS
  Filled 2019-02-18: qty 80

## 2019-02-18 MED ORDER — INSULIN ASPART 100 UNIT/ML ~~LOC~~ SOLN
0.0000 [IU] | Freq: Three times a day (TID) | SUBCUTANEOUS | Status: DC
  Administered 2019-02-19 (×3): 1 [IU] via SUBCUTANEOUS
  Administered 2019-02-20: 2 [IU] via SUBCUTANEOUS
  Administered 2019-02-20: 1 [IU] via SUBCUTANEOUS
  Administered 2019-02-20: 2 [IU] via SUBCUTANEOUS
  Administered 2019-02-21 (×2): 1 [IU] via SUBCUTANEOUS

## 2019-02-18 MED ORDER — ALBUTEROL SULFATE (2.5 MG/3ML) 0.083% IN NEBU
3.0000 mL | INHALATION_SOLUTION | Freq: Four times a day (QID) | RESPIRATORY_TRACT | Status: DC | PRN
  Administered 2019-02-18 – 2019-02-19 (×2): 3 mL via RESPIRATORY_TRACT
  Filled 2019-02-18 (×2): qty 3

## 2019-02-18 MED ORDER — FUROSEMIDE 10 MG/ML IJ SOLN
20.0000 mg | Freq: Once | INTRAMUSCULAR | Status: AC
  Administered 2019-02-18: 20 mg via INTRAVENOUS
  Filled 2019-02-18: qty 4
  Filled 2019-02-18: qty 2

## 2019-02-18 MED ORDER — SODIUM CHLORIDE 0.9% IV SOLUTION
Freq: Once | INTRAVENOUS | Status: AC
  Administered 2019-02-18: 17:00:00 via INTRAVENOUS

## 2019-02-18 MED ORDER — SODIUM CHLORIDE 0.9% FLUSH
10.0000 mL | INTRAVENOUS | Status: DC | PRN
  Administered 2019-02-20: 10 mL
  Filled 2019-02-18: qty 40

## 2019-02-18 MED ORDER — ACETAMINOPHEN 325 MG PO TABS: 650.0000 mg | ORAL_TABLET | Freq: Four times a day (QID) | ORAL | Status: DC | PRN

## 2019-02-18 NOTE — ED Notes (Signed)
Date and time results received: 02/18/19 1:15 PM  (use smartphrase ".now" to insert current time)  Test: Hgb Critical Value: 4.4  Name of Provider Notified: Pfeiffer  Orders Received? Or Actions Taken?: awaiting orders

## 2019-02-18 NOTE — ED Notes (Signed)
Attempted report. RN will call back.

## 2019-02-18 NOTE — H&P (Addendum)
History and Physical  [name] MVH:[id number] DOB: [birth year] DOA: 02/18/2019  Referring physician: Dr Johnney Killian PCP: [name], MD  Outpatient Specialists: GI Patient coming from: Home  Chief Complaint: Rectal bleeding  HPI: [name] is a 73 y.o. 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  Reports intermittent bright red blood per rectum.  Associated with generalized weakness and shortness of breath at rest.  Denies chest pain.  Denies fevers or chills.  Denies any sick contacts.  TRH asked to admit for GI bleed.  GI has been consulted by ED provider.  ED Course: On presentation to the ED, patient is tachycardic, appears jaundiced on exam.  Hemoglobin 4.4 with baseline hemoglobin of 8 and positive FOBT.  3 units PRBCs ordered to be transfused.  Review of Systems: Review of systems as noted in the HPI. All other systems reviewed and are negative.   Past Medical History:  Diagnosis Date  . Anxiety   . Bladder cancer ([place]) 2011  . Borderline diabetes mellitus   . CAD (coronary artery disease)   . Cigarette smoker   . COPD (chronic obstructive pulmonary disease) ([place])   . DJD (degenerative joint disease)   . Emphysema of lung ([place])   . Epistaxis   . History of colonic polyps 2004   hyperplastic   . History of sinusitis   . Hypercholesteremia   . Hypertension   . Obesity    Past Surgical History:  Procedure Laterality Date  . cataract surgery  septemeber 2013  . cataract surgery/sub posterior vitrectomy in left eye  1996   Dr. Zadie Rhine  . PTCA  [phone]    Social History:  reports that he quit smoking about 3 years ago. His smoking use included cigarettes. He has a 90.00 pack-year smoking history. He has never  used smokeless tobacco. He reports current alcohol use. He reports that he does not use drugs.   Allergies  Allergen Reactions  . Amlodipine Swelling    Family History  Problem Relation Age of Onset  . Heart disease Mother   . Heart disease Brother   . Cancer Brother   . Aneurysm Brother       Prior to Admission medications   Medication Sig Start Date End Date Taking? Authorizing Provider  Albuterol Sulfate (PROAIR RESPICLICK) 253 (90 Base) MCG/ACT AEPB Inhale 2 puffs into the lungs every 6 (six) hours as needed (shortness of breath). 10/26/18  Yes Noralee Space, MD  ALPRAZolam Duanne Moron) 0.5 MG tablet Take 1 tablet (0.5 mg total) by mouth at bedtime. 10/26/18  Yes Noralee Space, MD  Ascorbic Acid (VITAMIN C) 1000 MG tablet Take 1,000 mg by mouth daily.   Yes [provider]  budesonide (PULMICORT) 0.25 MG/2ML nebulizer solution Take 2 mLs (0.25 mg total) by nebulization 2 (two) times daily. 02/12/19  Yes Icard, Leory Plowman L, DO  cholecalciferol (VITAMIN D) 400 units TABS tablet Take 400 Units by mouth at bedtime.   Yes [provider]  Ferrous Sulfate (IRON) 325 (65 Fe) MG TABS Take 325 mg by mouth daily.   Yes [provider]  Flaxseed, Linseed, (FLAX SEEDS PO) Take 1 capsule by mouth daily.   Yes [provider]  furosemide (LASIX) 40 MG tablet Take 40 mg by mouth daily as  needed for fluid.    Yes [provider]  guaiFENesin (MUCINEX) 600 MG 12 hr tablet Take 2 tablets (1,200 mg total) by mouth 2 (two) times daily. 06/11/18  Yes Mariel Aloe, MD  ipratropium-albuterol (DUONEB) 0.5-2.5 (3) MG/3ML SOLN Take 3 mLs by nebulization every 4 (four) hours as needed. Patient taking differently: Take 3 mLs by nebulization 4 (four) times daily.  02/12/19  Yes Icard, Bradley L, DO  isosorbide mononitrate (IMDUR) 30 MG 24 hr tablet Take 30 mg by mouth daily.   Yes [provider]  lisinopril (PRINIVIL,ZESTRIL) 10 MG tablet Take 10 mg by mouth  daily.   Yes [provider]  methocarbamol (ROBAXIN) 500 MG tablet Take 1,000 mg by mouth at bedtime as needed for muscle spasms.  03/07/18  Yes [provider]  multivitamin-lutein (OCUVITE-LUTEIN) CAPS capsule Take 1 capsule by mouth every evening.    Yes [provider]  simvastatin (ZOCOR) 20 MG tablet Take 1 tablet (20 mg total) by mouth daily at 6 PM. 11/19/15  Yes Weaver, Nicki Reaper T, PA-C  vitamin B-12 (CYANOCOBALAMIN) 1000 MCG tablet Take 1,000 mcg by mouth daily.   Yes [provider]  acetaminophen (TYLENOL) 325 MG tablet Take 2 tablets (650 mg total) by mouth every 6 (six) hours as needed for mild pain (or Fever >/= 101). Patient not taking: Reported on 12/14/2018 06/11/18   Mariel Aloe, MD  fluticasone [facility]) 50 MCG/ACT nasal spray Place 2 sprays into both nostrils daily as needed for allergies or rhinitis.  09/21/18   [provider]  nitroGLYCERIN (NITROSTAT) 0.4 MG SL tablet Place 1 tablet (0.4 mg total) under the tongue every 5 (five) minutes as needed for chest pain. 11/19/15   Richardson Dopp T, PA-C  pantoprazole (PROTONIX) 40 MG tablet Take 1 tablet (40 mg total) by mouth daily. 05/18/18 12/14/18  Shelly Coss, MD  Polyethyl Glycol-Propyl Glycol (SYSTANE OP) Apply 1 drop to eye daily as needed (dry eyes).    [provider]  sodium chloride (OCEAN) 0.65 % SOLN nasal spray Place 1 spray into both nostrils as needed for congestion. Patient taking differently: Place 1 spray into both nostrils daily as needed for congestion.  10/29/15   Cherene Altes, MD  VOTRIENT 200 MG tablet  02/05/19   [provider]    Physical Exam: BP 134/69   Pulse 98   Temp 97.9 F (36.6 C) (Oral)   Resp 18   Wt 97.5 kg   SpO2 100%   BMI 34.70 kg/m   . General: 73 y.o. year-old male well developed well nourished in no acute distress.  Alert and oriented x3. . Cardiovascular: Regular rate and rhythm with no rubs or gallops.  No  thyromegaly or JVD noted.  Trace lower extremity edema. 2/4 pulses in all 4 extremities. [name] Respiratory: Clear to auscultation with no wheezes or rales. Good inspiratory effort. . Abdomen: Soft nontender mildly distended with normal bowel sounds x4 quadrants. . Muskuloskeletal: No cyanosis or clubbing.  Trace edema noted in lower extremities bilaterally . Neuro: CN II-XII intact, strength, sensation, reflexes . Skin: No ulcerative lesions noted or rashes.  Hyperpigmentation affecting lower extremities bilaterally. [name] Psychiatry: Judgement and insight appear normal. Mood is appropriate for condition and setting          Labs on Admission:  Basic Metabolic Panel: Recent Labs  Lab 02/18/19 1215  NA 135  K 4.0  CL 104  CO2 21*  GLUCOSE 181*  BUN 28*  CREATININE 0.93  CALCIUM 8.1*   Liver Function Tests: Recent Labs  Lab 02/18/19 1215  AST 46*  ALT 23  ALKPHOS 51  BILITOT 2.1*  PROT 6.3*  ALBUMIN 3.2*   Recent Labs  Lab 02/18/19 1215  LIPASE 27   No results for input(s): AMMONIA in the last 168 hours. CBC: Recent Labs  Lab 02/18/19 1215  WBC 9.0  NEUTROABS 6.8  HGB 4.4*  HCT 14.5*  MCV 102.8*  PLT 191   Cardiac Enzymes: Recent Labs  Lab 02/18/19 1215  TROPONINI 1.47*    BNP (last 3 results) Recent Labs    02/18/19 1215  BNP 215.3*    ProBNP (last 3 results) No results for input(s): PROBNP in the last 8760 hours.  CBG: No results for input(s): GLUCAP in the last 168 hours.  Radiological Exams on Admission: Dg Chest Port 1 View  Result Date: 02/18/2019 CLINICAL DATA:  Shortness of breath and abdominal pain. History of malignancy. EXAM: PORTABLE CHEST 1 VIEW COMPARISON:  01/22/2017; 01/19/2017; 09/26/2016; chest CT-02/17/2017 FINDINGS: Grossly unchanged cardiac silhouette and mediastinal contours with atherosclerotic plaque when thoracic aorta. The lungs remain hyperexpanded with thinning of the biapical pulmonary parenchyma and bibasilar linear  heterogeneous opacities, left greater than right. No pleural effusion or pneumothorax. No evidence of edema. Apparent development of bilateral nodular opacities with dominant nodular opacity overlying the peripheral aspect right upper lung measuring 1.6 cm and dominant nodular opacity overlying the left mid lung measuring 1.1 cm. IMPRESSION: 1. Interval apparent development of indeterminate bilateral nodular opacities, which given provided history of malignancy could represent metastasis. Further evaluation with chest CT could be performed as indicated. 2. Otherwise, similar findings of marked lung hyperexpansion without superimposed acute cardiopulmonary disease. Electronically Signed   By: Sandi Mariscal M.D.   On: 02/18/2019 12:25    EKG: I independently viewed the EKG done and my findings are as followed: Sinus tachycardia rate of 106 with nonspecific ST-T changes.  Assessment/Plan Present on Admission: . GI bleed  Active Problems:   GI bleed  Rectal bleed with prior history of GI bleed Presented with 3 days of intermittent bright red blood per rectum Follows with GI at [facility] Hemoglobin of 4.4 with positive FOBT on presentation Baseline hemoglobin 8.0 GI consulted and will see the patient N.p.o. due to possible procedure by GI Transfuse to maintain hemoglobin greater than 8  Elevated troponin, suspect demand ischemia from severe anemia Presented with troponin of 1.47 and hemoglobin of 4.4 12 EKG independently reviewed reveals sinus tachycardia with rate of 106 with nonspecific ST-T changes Denies chest pain Transfuse to maintain hemoglobin above 8 Repeat twelve-lead EKG later this afternoon after blood transfusion Cycle Trop x 3  Severe anemia secondary to acute blood loss from GI bleed Management as stated above Repeat CBC in the morning  Hyperbilirubinemia Bilirubin 2.1 Jaundice on exam Mildly elevated AST Normal alkaline phosphatase and ALT Repeat labs in the morning  GI consulted and following   Elevated lactic acid Presented with lactic acid of 2.4 Suspect contributed by severe anemia  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 Outpatient follow-up  Chronic anxiety Continue Xanax at night  Chronic diastolic CHF Last 2D echo done on 01/20/2017 revealed LVEF 55 to 60% with no regional wall abnormalities Strict I's and O's and daily weight Resume cardiac medications Hold antihypertensive meds due to GI bleed  OSA C/w BIPAP at night  Impaired Fasting glucose Obtain A1c Start sensitive insulin sliding scale  Heart healthy carb modified diet   Risks: High risk for decompensation due to severe anemia requiring blood transfusion, elevated troponin suspect secondary to demand ischemia from severe anemia, multiple comorbidities and advanced age.  Patient will require at least 2 midnights for further evaluation and treatment of present condition.     DVT prophylaxis: SCDs  Code Status: Limited Code: No CPR; Can intubate, as requested by the patient.  Family Communication: None at bedside  Disposition Plan: Admit to telemetry unit  Consults called: GI  Admission status: Inpatient status    Kayleen Memos MD [facility] Pager [phone]  If 7PM-7AM, please contact night-coverage [email or web address] Password [email or web address]  02/18/2019, 2:55 PM

## 2019-02-18 NOTE — ED Notes (Signed)
Date and time results received: 02/18/19 2:06 PM  (use smartphrase ".now" to insert current time)  Test: Troponin Critical Value: 1.47  Name of Provider Notified: Pfeiffer  Orders Received? Or Actions Taken?: awaiting orders

## 2019-02-18 NOTE — ED Notes (Addendum)
ED TO INPATIENT HANDOFF REPORT  ED Nurse Name and Phone #: Hiep Ollis [phone]  S Name/Age/Gender [name] 73 y.o. male Room/Bed: WA24/WA24  Code Status   Code Status: Prior  Home/SNF/Other Home Patient oriented to: self, place, time and situation Is this baseline? Yes   Triage Complete: Triage complete  Chief Complaint rectal bleeding  Triage Note Pt arrives vis [facility] from home. Pt has had dark tarry stools since Friday. Pt has hx of the same. Pt has abd tumor. Pt has COPD and wears 6L o2 at baseline.    Allergies Allergies  Allergen Reactions  . Amlodipine Swelling    Level of Care/Admitting Diagnosis ED Disposition    ED Disposition Condition [facility] Area: [place] [[id number]]  Level of Care: Telemetry [5]  Admit to tele based on following criteria: Monitor for Ischemic changes  Diagnosis: GI bleed [[id number]]  Admitting Physician: Kayleen Memos [[id number]]  Attending Physician: Kayleen Memos [[id number]]  Estimated length of stay: past midnight tomorrow  Certification:: I certify this patient will need inpatient services for at least 2 midnights  PT Class (Do Not Modify): Inpatient [101]  PT Acc Code (Do Not Modify): Private [1]       B Medical/Surgery History Past Medical History:  Diagnosis Date  . Anxiety   . Bladder cancer ([place]) 2011  . Borderline diabetes mellitus   . CAD (coronary artery disease)   . Cigarette smoker   . COPD (chronic obstructive pulmonary disease) ([place])   . DJD (degenerative joint disease)   . Emphysema of lung ([place])   . Epistaxis   . History of colonic polyps 2004   hyperplastic   . History of sinusitis   . Hypercholesteremia   . Hypertension   . Obesity    Past Surgical History:  Procedure Laterality Date  . cataract surgery  septemeber 2013  . cataract surgery/sub posterior vitrectomy in left eye  1996   Dr. Zadie Rhine  . PTCA  [phone]     A IV  Location/Drains/Wounds Patient Lines/Drains/Airways Status   Active Line/Drains/Airways    Name:   Placement date:   Placement time:   Site:   Days:   Peripheral IV 02/18/19 Right Antecubital   02/18/19    1235    Antecubital   less than 1   Peripheral IV 02/18/19 Left Antecubital   02/18/19    1244    Antecubital   less than 1          Intake/Output Last 24 hours  Intake/Output Summary (Last 24 hours) at 02/18/2019 1502 Last data filed at 02/18/2019 1256 Gross per 24 hour  Intake 0 ml  Output -  Net 0 ml    Labs/Imaging Results for orders placed or performed during the hospital encounter of 02/18/19 (from the past 48 hour(s))  POC occult blood, ED     Status: Abnormal   Collection Time: 02/18/19 11:49 AM  Result Value Ref Range   Fecal Occult Bld POSITIVE (A) NEGATIVE  Comprehensive metabolic panel     Status: Abnormal   Collection Time: 02/18/19 12:15 PM  Result Value Ref Range   Sodium 135 135 - 145 mmol/L   Potassium 4.0 3.5 - 5.1 mmol/L   Chloride 104 98 - 111 mmol/L   CO2 21 (L) 22 - 32 mmol/L   Glucose, Bld 181 (H) 70 - 99 mg/dL   BUN 28 (H) 8 - 23 mg/dL   Creatinine, Ser 0.93 0.61 -  1.24 mg/dL   Calcium 8.1 (L) 8.9 - 10.3 mg/dL   Total Protein 6.3 (L) 6.5 - 8.1 g/dL   Albumin 3.2 (L) 3.5 - 5.0 g/dL   AST 46 (H) 15 - 41 U/L   ALT 23 0 - 44 U/L   Alkaline Phosphatase 51 38 - 126 U/L   Total Bilirubin 2.1 (H) 0.3 - 1.2 mg/dL   GFR calc non Af Amer >60 >60 mL/min   GFR calc Af Amer >60 >60 mL/min   Anion gap 10 5 - 15    Comment: Performed at [facility], [place] [address], [place], [place] [address]  Lipase, blood     Status: None   Collection Time: 02/18/19 12:15 PM  Result Value Ref Range   Lipase 27 11 - 51 U/L    Comment: Performed at [facility], [place] [address], [place], [place] [address]  Brain natriuretic peptide     Status: Abnormal   Collection Time: 02/18/19 12:15 PM  Result Value Ref Range   B Natriuretic Peptide  215.3 (H) 0.0 - 100.0 pg/mL    Comment: Performed at [facility], [place] [address] [place], [place] [address]  Troponin I - Once     Status: Abnormal   Collection Time: 02/18/19 12:15 PM  Result Value Ref Range   Troponin I 1.47 (HH) <0.03 ng/mL    Comment: CRITICAL RESULT CALLED TO, READ BACK BY AND VERIFIED WITH: BRILL,M [id number] COVINGTON,N Performed at [facility], [place] [employer] [address], [place], [place] [address]   Lactic acid, plasma     Status: Abnormal   Collection Time: 02/18/19 12:15 PM  Result Value Ref Range   Lactic Acid, Venous 2.4 (HH) 0.5 - 1.9 mmol/L    Comment: CRITICAL RESULT CALLED TO, READ BACK BY AND VERIFIED WITH: BRILE,M RN [id number] COVINGTON.N Performed at [facility],  [address], [place], [place] [address]   CBC with Differential     Status: Abnormal   Collection Time: 02/18/19 12:15 PM  Result Value Ref Range   WBC 9.0 4.0 - 10.5 K/uL   RBC 1.41 (L) 4.22 - 5.81 MIL/uL   Hemoglobin 4.4 (LL) 13.0 - 17.0 g/dL    Comment: REPEATED TO VERIFY THIS CRITICAL RESULT HAS VERIFIED AND BEEN CALLED TO M BRILL,RN BY JACQUELYN HOLMES ON 04 05 2020 AT 2694, AND HAS BEEN READ BACK. CRITICAL RESULTS VERIFIED    HCT 14.5 (L) 39.0 - 52.0 %   MCV 102.8 (H) 80.0 - 100.0 fL   MCH 31.2 26.0 - 34.0 pg   MCHC 30.3 30.0 - 36.0 g/dL   RDW 22.2 (H) 11.5 - 15.5 %   Platelets 191 150 - 400 K/uL   nRBC 1.3 (H) 0.0 - 0.2 %   Neutrophils Relative % 76 %   Neutro Abs 6.8 1.7 - 7.7 K/uL   Lymphocytes Relative 9 %   Lymphs Abs 0.8 0.7 - 4.0 K/uL   Monocytes Relative 8 %   Monocytes Absolute 0.8 0.1 - 1.0 K/uL   Eosinophils Relative 1 %   Eosinophils Absolute 0.1 0.0 - 0.5 K/uL   Basophils Relative 0 %   Basophils Absolute 0.0 0.0 - 0.1 K/uL   Smear Review MORPHOLOGY UNREMARKABLE    Immature Granulocytes 6 %   Abs Immature Granulocytes 0.50 (H) 0.00 - 0.07 K/uL    Comment: Performed at [facility],  [place] [address], [place], [place] [address]  Protime-INR  Status: None   Collection Time: 02/18/19 12:15 PM  Result Value Ref Range   Prothrombin Time 14.2 11.4 - 15.2 seconds   INR 1.1 0.8 - 1.2    Comment: (NOTE) INR goal varies based on device and disease states. Performed at [facility], [place] [address] [place], [place] [address]   Type and screen [place]     Status: None (Preliminary result)   Collection Time: 02/18/19 12:15 PM  Result Value Ref Range   ABO/RH(D) A POS    Antibody Screen NEG    Sample Expiration      02/21/2019 Performed at [facility], [place] [address] [place], [place] [address]    Unit Number [id number]    Blood Component Type RED CELLS,LR    Unit division 00    Status of Unit ALLOCATED    Transfusion Status OK TO TRANSFUSE    Crossmatch Result Compatible    Unit Number [id number]    Blood Component Type RED CELLS,LR    Unit division 00    Status of Unit ALLOCATED    Transfusion Status OK TO TRANSFUSE    Crossmatch Result Compatible   Urinalysis, Routine w reflex microscopic     Status: Abnormal   Collection Time: 02/18/19  1:19 PM  Result Value Ref Range   Color, Urine YELLOW YELLOW   APPearance CLEAR CLEAR   Specific Gravity, Urine 1.020 1.005 - 1.030   pH 5.0 5.0 - 8.0   Glucose, UA NEGATIVE NEGATIVE mg/dL   Hgb urine dipstick NEGATIVE NEGATIVE   Bilirubin Urine NEGATIVE NEGATIVE   Ketones, ur 5 (A) NEGATIVE mg/dL   Protein, ur NEGATIVE NEGATIVE mg/dL   Nitrite NEGATIVE NEGATIVE   Leukocytes,Ua NEGATIVE NEGATIVE    Comment: Performed at [facility], [place] [address], [place], [place] [address]  Prepare RBC     Status: None   Collection Time: 02/18/19  1:29 PM  Result Value Ref Range   Order Confirmation      ORDER PROCESSED BY BLOOD BANK Performed at [place] [address] [place], [place] [address]    Dg Chest Port 1  View  Result Date: 02/18/2019 CLINICAL DATA:  Shortness of breath and abdominal pain. History of malignancy. EXAM: PORTABLE CHEST 1 VIEW COMPARISON:  01/22/2017; 01/19/2017; 09/26/2016; chest CT-02/17/2017 FINDINGS: Grossly unchanged cardiac silhouette and mediastinal contours with atherosclerotic plaque when thoracic aorta. The lungs remain hyperexpanded with thinning of the biapical pulmonary parenchyma and bibasilar linear heterogeneous opacities, left greater than right. No pleural effusion or pneumothorax. No evidence of edema. Apparent development of bilateral nodular opacities with dominant nodular opacity overlying the peripheral aspect right upper lung measuring 1.6 cm and dominant nodular opacity overlying the left mid lung measuring 1.1 cm. IMPRESSION: 1. Interval apparent development of indeterminate bilateral nodular opacities, which given provided history of malignancy could represent metastasis. Further evaluation with chest CT could be performed as indicated. 2. Otherwise, similar findings of marked lung hyperexpansion without superimposed acute cardiopulmonary disease. Electronically Signed   By: Sandi Mariscal M.D.   On: 02/18/2019 12:25    Pending Labs Unresulted Labs (From admission, onward)    Start     Ordered   02/18/19 1128  Culture, blood (routine x 2)  BLOOD CULTURE X 2,   STAT     02/18/19 1129   02/18/19 1127  Lactic acid, plasma  Now then every 2 hours,   STAT     02/18/19  1129          Vitals/Pain Today's Vitals   02/18/19 1330 02/18/19 1400 02/18/19 1430 02/18/19 1442  BP: 123/65 117/61 134/69   Pulse: (!) 108 (!) 104 98 98  Resp: 18 16 16 18   Temp:      TempSrc:      SpO2: 100% 100% 100% 100%  Weight:      PainSc:        Isolation Precautions No active isolations  Medications Medications  pantoprazole (PROTONIX) 80 mg in sodium chloride 0.9 % 250 mL (0.32 mg/mL) infusion (8 mg/hr Intravenous New Bag/Given 02/18/19 1236)  0.9 %  sodium chloride infusion  (Manually program via Guardrails IV Fluids) ( Intravenous Hold 02/18/19 1333)  furosemide (LASIX) injection 20 mg (0 mg Intravenous Hold 02/18/19 1417)  pantoprazole (PROTONIX) 80 mg in sodium chloride 0.9 % 100 mL IVPB (0 mg Intravenous Stopped 02/18/19 1256)  albuterol (PROVENTIL HFA;VENTOLIN HFA) 108 (90 Base) MCG/ACT inhaler 4 puff (4 puffs Inhalation Given 02/18/19 1304)  ipratropium (ATROVENT HFA) inhaler 2 puff (2 puffs Inhalation Given 02/18/19 1302)    Mobility walks Low fall risk   Focused Assessments dark stools, ambulatory at baseline but is weak/dizzy   R Recommendations: See Admitting Provider Note  Report given to: Maggie RN  Additional Notes:

## 2019-02-18 NOTE — ED Notes (Signed)
Date and time results received: 02/18/19 12:53 PM  (use smartphrase ".now" to insert current time)  Test: Lactic Critical Value: 2.4  Name of Provider Notified: Pfeiffer  Orders Received? Or Actions Taken?: awaiting orders

## 2019-02-18 NOTE — ED Provider Notes (Addendum)
[facility] DEPT Provider Note   CSN: [id number] Arrival date & time: 02/18/19  1059    History   Chief Complaint Chief Complaint  Patient presents with  . Rectal Bleeding    HPI [name] is a 73 y.o. male.     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  He has had very dark stool for about 4 days.  He reports he has been getting increasingly short of breath and very weak.  Patient reports he is sure that he is anemic.  He reports this has happened before.  He reports that he did have a GI bleed with a "ozzing artery".  IR report from [phone] at [facility] "selective and superselective angiography the superior mesenteric artery without evidence of active extravasation in the region of concern.  No embolization was performed."  Patient is not on any blood thinners.  He has not had any fevers or chills.  He has been self isolating.  Patient has been trying to avoid seeking medical care due to risk of covert exposure. Past Medical History:  Diagnosis Date  . Anxiety   . Bladder cancer ([place]) 2011  . Borderline diabetes mellitus   . CAD (coronary artery disease)   . Cigarette smoker   . COPD (chronic obstructive pulmonary disease) ([place])   . DJD (degenerative joint disease)   . Emphysema of lung ([place])   . Epistaxis   . History of colonic polyps 2004   hyperplastic   . History of sinusitis   . Hypercholesteremia   . Hypertension   . Obesity     Patient Active Problem List   Diagnosis Date Noted  . DNR (do not resuscitate) 12/21/2018  . Anemia 12/14/2018  . Liver metastasis ([place]) 07/13/2018  . Acute blood loss anemia 06/07/2018  . Rectal bleeding 05/13/2018  . Chronic diastolic CHF (congestive heart failure) ([place]) 05/13/2018  . Lower GI bleed 05/13/2018  . Class 2 obesity with body mass index (BMI) of 37.0 to 37.9 in adult 01/10/2018  . 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 Acute on chronic diastolic CHF (congestive heart failure) ([place]) 01/19/2017  . Chronic respiratory failure with hypoxia and hypercapnia ([place]) 10/13/2016  . OSA treated with BiPAP 10/13/2016  . Macrocytic anemia 09/24/2016  . COPD with acute exacerbation ([place]) 09/24/2016  . Acute bronchitis 02/03/2016  . Ex-smoker 10/31/2015  . Dilated cardiomyopathy ([place])   . Pulmonary hypertension ([place])   . Acute on chronic respiratory failure with hypoxia and hypercapnia ([place]) 10/20/2015  . 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 LBP (low back pain) 03/24/2011  . Malignant neoplasm of bladder ([place]) 09/22/2010  . HEMATURIA UNSPECIFIED 05/14/2010  . Osteoarthritis 03/28/2010  . Coronary atherosclerosis of native coronary artery 12/19/2007  . Acute sinusitis 12/19/2007  . COPD mixed type ([place]) 12/19/2007  . COLONIC POLYPS 12/18/2007  . HYPERCHOLESTEROLEMIA 12/18/2007  . Anxiety 12/18/2007  . Essential hypertension 12/18/2007  . GASTRITIS 12/18/2007  . Impaired fasting glucose 12/18/2007    Past Surgical History:  Procedure Laterality Date  . cataract surgery  septemeber 2013  . cataract surgery/sub posterior vitrectomy in left eye  1996   Dr. Zadie Rhine  . PT[facility]  [phone]        Home Medications    Prior to Admission medications   Medication Sig Start Date End Date Taking? Authorizing Provider  acetaminophen (TYLENOL) 325 MG tablet Take 2 tablets (650 mg total) by  mouth every 6 (six) hours as needed for mild pain (or Fever >/= 101). Patient not taking: Reported on 12/14/2018 06/11/18   Mariel Aloe, MD  Albuterol Sulfate (PROAIR RESPICLICK) 628 (90 Base) MCG/ACT AEPB Inhale 2 puffs into the lungs every 6 (six) hours as needed (shortness of breath). 10/26/18   Noralee Space, MD  ALPRAZolam Duanne Moron) 0.5 MG tablet Take 1 tablet (0.5 mg total) by mouth at bedtime. 10/26/18   Noralee Space, MD  Ascorbic Acid (VITAMIN C) 1000 MG tablet Take 1,000 mg by mouth daily.     [provider]  budesonide (PULMICORT) 0.25 MG/2ML nebulizer solution Take 2 mLs (0.25 mg total) by nebulization 2 (two) times daily. 02/12/19   Garner Nash, DO  cholecalciferol (VITAMIN D) 400 units TABS tablet Take 400 Units by mouth at bedtime.    [provider]  Ferrous Sulfate (IRON) 325 (65 Fe) MG TABS Take 325 mg by mouth daily.    [provider]  Flaxseed, Linseed, (FLAX SEEDS PO) Take 1 capsule by mouth daily.    [provider]  fluticasone (FLONASE) 50 MCG/ACT nasal spray Place 2 sprays into both nostrils daily as needed for allergies or rhinitis.  09/21/18   [provider]  furosemide (LASIX) 40 MG tablet Take 40 mg by mouth daily.     [provider]  guaiFENesin (MUCINEX) 600 MG 12 hr tablet Take 2 tablets (1,200 mg total) by mouth 2 (two) times daily. 06/11/18   Mariel Aloe, MD  ipratropium-albuterol (DUONEB) 0.5-2.5 (3) MG/3ML SOLN Take 3 mLs by nebulization every 4 (four) hours as needed. 02/12/19   Garner Nash, DO  isosorbide mononitrate (IMDUR) 30 MG 24 hr tablet Take 30 mg by mouth daily.    [provider]  lisinopril (PRINIVIL,ZESTRIL) 10 MG tablet Take 10 mg by mouth daily.    [provider]  methocarbamol (ROBAXIN) 500 MG tablet Take 500-1,000 mg by mouth 2 (two) times daily as needed for muscle spasms.  03/07/18   [provider]  multivitamin-lutein (OCUVITE-LUTEIN) CAPS capsule Take 1 capsule by mouth 2 (two) times daily.     [provider]  nitroGLYCERIN (NITROSTAT) 0.4 MG SL tablet Place 1 tablet (0.4 mg total) under the tongue every 5 (five) minutes as needed for chest pain. 11/19/15   Richardson Dopp T, PA-C  pantoprazole (PROTONIX) 40 MG tablet Take 1 tablet (40 mg total) by mouth daily. 05/18/18 12/14/18  Shelly Coss, MD  Polyethyl Glycol-Propyl Glycol (SYSTANE OP) Apply 1 drop to eye daily as needed (dry eyes).    [provider]  simvastatin (ZOCOR) 20 MG  tablet Take 1 tablet (20 mg total) by mouth daily at 6 PM. 11/19/15   Kathlen Mody, Nicki Reaper T, PA-C  sodium chloride (OCEAN) 0.65 % SOLN nasal spray Place 1 spray into both nostrils as needed for congestion. Patient taking differently: Place 1 spray into both nostrils daily as needed for congestion.  10/29/15   Cherene Altes, MD  vitamin B-12 (CYANOCOBALAMIN) 1000 MCG tablet Take 1,000 mcg by mouth daily.    [provider]    Family History Family History  Problem Relation Age of Onset  . Heart disease Mother   . Heart disease Brother   . Cancer Brother   . Aneurysm Brother     Social History Social History   Tobacco Use  . Smoking status: Former Smoker    Packs/day: 2.00    Years: 45.00  Pack years: 90.00    Types: Cigarettes    Last attempt to quit: 10/17/2015    Years since quitting: 3.3  . Smokeless tobacco: Never Used  Substance Use Topics  . Alcohol use: Yes    Alcohol/week: 0.0 standard drinks    Comment: glass of wine at night  . Drug use: No     Allergies   Amlodipine   Review of Systems Review of Systems 10 Systems reviewed and are negative for acute change except as noted in the HPI.  Physical Exam Updated Vital Signs BP 123/65   Pulse (!) 108   Temp 97.9 F (36.6 C) (Oral)   Resp 18   Wt 97.5 kg   SpO2 100%   BMI 34.70 kg/m   Physical Exam Constitutional:      Comments: Patient is alert and nontoxic.  His mental status is clear.  He has mild to moderate increased work of breathing with tachypnea.  Patient is very pale.  HENT:     Head: Normocephalic and atraumatic.     Mouth/Throat:     Mouth: Mucous membranes are moist.     Pharynx: Oropharynx is clear.  Eyes:     Extraocular Movements: Extraocular movements intact.  Cardiovascular:     Rate and Rhythm: Normal rate and regular rhythm.     Pulses: Normal pulses.  Pulmonary:     Comments: Breath sounds are very soft throughout.  No gross wheeze or rales appreciable. Abdominal:      Comments: Abdomen is soft but has a palpable firm masses distributed mostly in the central and right quadrants.  No guarding.  No edema of the abdominal wall.  Genitourinary:    Comments: Stool is grossly melanotic.  Black in appearance. Musculoskeletal:     Comments: Trace to 1+ pitting edema bilateral lower extremities.  Skin:    General: Skin is warm and dry.     Coloration: Skin is pale.  Neurological:     General: No focal deficit present.     Mental Status: He is oriented to person, place, and time.     Coordination: Coordination normal.  Psychiatric:        Mood and Affect: Mood normal.      ED Treatments / Results  Labs (all labs ordered are listed, but only abnormal results are displayed) Labs Reviewed  BRAIN NATRIURETIC PEPTIDE - Abnormal; Notable for the following components:      Result Value   B Natriuretic Peptide 215.3 (*)    All other components within normal limits  LACTIC ACID, PLASMA - Abnormal; Notable for the following components:   Lactic Acid, Venous 2.4 (*)    All other components within normal limits  CBC WITH DIFFERENTIAL/PLATELET - Abnormal; Notable for the following components:   RBC 1.41 (*)    Hemoglobin 4.4 (*)    HCT 14.5 (*)    MCV 102.8 (*)    RDW 22.2 (*)    nRBC 1.3 (*)    Abs Immature Granulocytes 0.50 (*)    All other components within normal limits  URINALYSIS, ROUTINE W REFLEX MICROSCOPIC - Abnormal; Notable for the following components:   Ketones, ur 5 (*)    All other components within normal limits  POC OCCULT BLOOD, ED - Abnormal; Notable for the following components:   Fecal Occult Bld POSITIVE (*)    All other components within normal limits  CULTURE, BLOOD (ROUTINE X 2)  CULTURE, BLOOD (ROUTINE X 2)  PROTIME-INR  COMPREHENSIVE METABOLIC  PANEL  LIPASE, BLOOD  TROPONIN I  LACTIC ACID, PLASMA  TYPE AND SCREEN  PREPARE RBC (CROSSMATCH)    EKG EKG Interpretation  Date/Time:  Sunday February 18 2019 11:36:10 EDT  Ventricular Rate:  106 PR Interval:    QRS Duration: 111 QT Interval:  364 QTC Calculation: 484 R Axis:   29 Text Interpretation:  Sinus tachycardia Repol abnrm suggests ischemia, diffuse leads Minimal ST elevation, lateral leads agree, lateral ST depression and inferior increased relative to previous Confirmed by Charlesetta Shanks [phone]) on 02/18/2019 11:56:24 AM Also confirmed by Charlesetta Shanks [phone]), editor Philomena Doheny [phone])  on 02/18/2019 1:16:20 PM   Radiology Dg Chest Port 1 View  Result Date: 02/18/2019 CLINICAL DATA:  Shortness of breath and abdominal pain. History of malignancy. EXAM: PORTABLE CHEST 1 VIEW COMPARISON:  01/22/2017; 01/19/2017; 09/26/2016; chest CT-02/17/2017 FINDINGS: Grossly unchanged cardiac silhouette and mediastinal contours with atherosclerotic plaque when thoracic aorta. The lungs remain hyperexpanded with thinning of the biapical pulmonary parenchyma and bibasilar linear heterogeneous opacities, left greater than right. No pleural effusion or pneumothorax. No evidence of edema. Apparent development of bilateral nodular opacities with dominant nodular opacity overlying the peripheral aspect right upper lung measuring 1.6 cm and dominant nodular opacity overlying the left mid lung measuring 1.1 cm. IMPRESSION: 1. Interval apparent development of indeterminate bilateral nodular opacities, which given provided history of malignancy could represent metastasis. Further evaluation with chest CT could be performed as indicated. 2. Otherwise, similar findings of marked lung hyperexpansion without superimposed acute cardiopulmonary disease. Electronically Signed   By: Sandi Mariscal M.D.   On: 02/18/2019 12:25    Procedures Procedures (including critical care time) CRITICAL CARE Performed by: Charlesetta Shanks   Total critical care time: 30 minutes  Critical care time was exclusive of separately billable procedures and treating other patients.  Critical care was necessary  to treat or prevent imminent or life-threatening deterioration.  Critical care was time spent personally by me on the following activities: development of treatment plan with patient and/or surrogate as well as nursing, discussions with consultants, evaluation of patient's response to treatment, examination of patient, obtaining history from patient or surrogate, ordering and performing treatments and interventions, ordering and review of laboratory studies, ordering and review of radiographic studies, pulse oximetry and re-evaluation of patient's condition. Medications Ordered in ED Medications  pantoprazole (PROTONIX) 80 mg in sodium chloride 0.9 % 250 mL (0.32 mg/mL) infusion (8 mg/hr Intravenous New Bag/Given 02/18/19 1236)  0.9 %  sodium chloride infusion (Manually program via Guardrails IV Fluids) ( Intravenous Hold 02/18/19 1333)  pantoprazole (PROTONIX) 80 mg in sodium chloride 0.9 % 100 mL IVPB (0 mg Intravenous Stopped 02/18/19 1256)  albuterol (PROVENTIL HFA;VENTOLIN HFA) 108 (90 Base) MCG/ACT inhaler 4 puff (4 puffs Inhalation Given 02/18/19 1304)  ipratropium (ATROVENT HFA) inhaler 2 puff (2 puffs Inhalation Given 02/18/19 1302)     Initial Impression / Assessment and Plan / ED Course  I have reviewed the triage vital signs and the nursing notes.  Pertinent labs & imaging results that were available during my care of the patient were reviewed by me and considered in my medical decision making (see chart for details).  Clinical Course as of Feb 21 851  Sun Feb 18, 2019  1349 Reviewed Nicoletta Ba with [facility] gastroenterology will see for consultation.   [MP]    Clinical Course User Index [MP] Charlesetta Shanks, MD        Consultation: [facility] gastro[facility]ology pending Consult: [facility] hospitalist for admission.  Patient presents with severe underlying medical comorbidities.  At baseline, patient has hypoxic respiratory failure.  He has metastatic spindle cell carcinoma.  She has  become profoundly anemic due to GI bleed.  Stool is black and melanotic.  At this time no clot or cranberry colored blood identified.  Patient's blood pressure has been stable.  Patient's EKG shows diffuse ST depressions likely demand ischemia.  Patient is critically ill.  Will need admission to the hospital for appropriate resuscitation.  Final Clinical Impressions(s) / ED Diagnoses   Final diagnoses:  Gastrointestinal hemorrhage, unspecified gastrointestinal hemorrhage type  Symptomatic anemia  Severe comorbid illness    ED Discharge Orders    None       Charlesetta Shanks, MD 02/18/19 1347    Charlesetta Shanks, MD 02/21/19 [phone]

## 2019-02-18 NOTE — ED Triage Notes (Signed)
Pt arrives vis [facility] from home. Pt has had dark tarry stools since Friday. Pt has hx of the same. Pt has abd tumor. Pt has COPD and wears 6L o2 at baseline.

## 2019-02-18 NOTE — Consult Note (Addendum)
Consultation  Referring Provider: [facility] Dr Johnney Killian Primary Care Physician:  [name], MD Primary Gastroenterologist:  Dr.Stark  Reason for Consultation:  GI bleed  HPI: [name] is a 73 y.o. male, established with Dr. Fuller Plan who we are asked to see for GI bleeding.  Patient has a very complicated past medical history with much of his care currently through [facility]. Patient presented to the emergency room earlier today with progressive weakness and intermittent chest pressure over the past couple of days.  He has also had an increase in dyspnea from his baseline.  He has severe COPD, and is on 4 to 6 L nasal cannula at home chronically, and requires BiPAP time he is sleeping.  He states that he had 2 episodes of tarry black stools on Friday, 02/16/2019.  Prior to that he had not noticed any blood in his stools are darker stools and his bowel movements have been normal.  He had 2 small bowel movements yesterday and one small bowel movement this morning still black.  He thinks the bleeding is resolving.  He has had some mild soreness in his right lower quadrant over the past couple of weeks, no changes in bowel habits, no abdominal distention, no nausea or vomiting and has been able to eat without difficulty. He is not currently on any aspirin or antiplatelet meds. 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  This lesion measures over 25 cm x 17 cm and occupies much of his right lower abdomen.  He has associated small pulmonary nodules likely metastatic and has had liver metastases.  Status post TACE procedure for 1 of the liver mets. Most recent CT scan was done in February 2020 at [facility] showed stable size of the known large right lower quadrant tumor.  Evidence of prior chemoembolization of 1 hepatic metastases and development of 1 new left hepatic lobe metastasis.  He was to start on a new oral  chemo but says he has not started it yet and is not sure he is going to be able to as it is $2500 per month.  He last had a GI bleed in July 2019 and was initially hospitalized here, but due to his severe pulmonary disease decision was made to transfer him to [facility] for management.  He did undergo colonoscopy there which per the patient was negative.  He does have prior history of adenomatous colon polyps and has sigmoid and descending colon diverticulosis. Had CT Angio of the abdomen done at [facility] during that admission which showed a hypertrophic branch of the SMA supplying the large right lower quadrant tumor.  There was no active extravasation and therefore no coil or embolization done.  Patient says he has not had any evidence of any GI bleeding since then until 2 days ago. Reviewing chart his baseline hemoglobin has been in the 8-1/2-9 range.  Again he was transfused 1 unit of packed RBCs on 01/17/2019 at [facility] for hemoglobin of 7.7.   In the ER today hemoglobin 4.4 hematocrit 14.5, WBC 9.0, platelets 191.  Pro time/INR within normal limits.  Initial troponin elevated at 1.47 BNP 215 BUN 28, creatinine 0.93, LFTs, T bili 2.1, AST of 46.  No abdominal imaging today Chest x-ray today-interval apparent development of indeterminate bilateral nodular opacities could represent metastases otherwise similar findings of marked lung hyperexpansion.     Past Medical History:  Diagnosis Date  . Anxiety   .  Bladder cancer ([place]) 2011  . Borderline diabetes mellitus   . CAD (coronary artery disease)   . Cigarette smoker   . COPD (chronic obstructive pulmonary disease) ([place])   . DJD (degenerative joint disease)   . Emphysema of lung ([place])   . Epistaxis   . History of colonic polyps 2004   hyperplastic   . History of sinusitis   . Hypercholesteremia   . Hypertension   . Obesity     Past Surgical History:  Procedure Laterality Date  . cataract surgery  septemeber 2013  . cataract  surgery/sub posterior vitrectomy in left eye  1996   Dr. Zadie Rhine  . PTCA  [phone]    Prior to Admission medications   Medication Sig Start Date End Date Taking? Authorizing Provider  Albuterol Sulfate (PROAIR RESPICLICK) 443 (90 Base) MCG/ACT AEPB Inhale 2 puffs into the lungs every 6 (six) hours as needed (shortness of breath). 10/26/18  Yes Noralee Space, MD  ALPRAZolam Duanne Moron) 0.5 MG tablet Take 1 tablet (0.5 mg total) by mouth at bedtime. 10/26/18  Yes Noralee Space, MD  Ascorbic Acid (VITAMIN C) 1000 MG tablet Take 1,000 mg by mouth daily.   Yes [provider]  budesonide (PULMICORT) 0.25 MG/2ML nebulizer solution Take 2 mLs (0.25 mg total) by nebulization 2 (two) times daily. 02/12/19  Yes Icard, Leory Plowman L, DO  cholecalciferol (VITAMIN D) 400 units TABS tablet Take 400 Units by mouth at bedtime.   Yes [provider]  Ferrous Sulfate (IRON) 325 (65 Fe) MG TABS Take 325 mg by mouth daily.   Yes [provider]  Flaxseed, Linseed, (FLAX SEEDS PO) Take 1 capsule by mouth daily.   Yes [provider]  furosemide (LASIX) 40 MG tablet Take 40 mg by mouth daily as needed for fluid.    Yes [provider]  guaiFENesin (MUCINEX) 600 MG 12 hr tablet Take 2 tablets (1,200 mg total) by mouth 2 (two) times daily. 06/11/18  Yes Mariel Aloe, MD  ipratropium-albuterol (DUONEB) 0.5-2.5 (3) MG/3ML SOLN Take 3 mLs by nebulization every 4 (four) hours as needed. Patient taking differently: Take 3 mLs by nebulization 4 (four) times daily.  02/12/19  Yes Icard, Bradley L, DO  isosorbide mononitrate (IMDUR) 30 MG 24 hr tablet Take 30 mg by mouth daily.   Yes [provider]  lisinopril (PRINIVIL,ZESTRIL) 10 MG tablet Take 10 mg by mouth daily.   Yes [provider]  methocarbamol (ROBAXIN) 500 MG tablet Take 1,000 mg by mouth at bedtime as needed for muscle spasms.  03/07/18  Yes [provider]  multivitamin-lutein (OCUVITE-LUTEIN)  CAPS capsule Take 1 capsule by mouth every evening.    Yes [provider]  simvastatin (ZOCOR) 20 MG tablet Take 1 tablet (20 mg total) by mouth daily at 6 PM. 11/19/15  Yes Weaver, Nicki Reaper T, PA-C  vitamin B-12 (CYANOCOBALAMIN) 1000 MCG tablet Take 1,000 mcg by mouth daily.   Yes [provider]  acetaminophen (TYLENOL) 325 MG tablet Take 2 tablets (650 mg total) by mouth every 6 (six) hours as needed for mild pain (or Fever >/= 101). Patient not taking: Reported on 12/14/2018 06/11/18   Mariel Aloe, MD  fluticasone [facility]) 50 MCG/ACT nasal spray Place 2 sprays into both nostrils daily as needed for allergies or rhinitis.  09/21/18   [provider]  nitroGLYCERIN (NITROSTAT) 0.4 MG SL tablet Place 1 tablet (0.4 mg total) under the tongue every 5 (five) minutes as needed  for chest pain. 11/19/15   Richardson Dopp T, PA-C  pantoprazole (PROTONIX) 40 MG tablet Take 1 tablet (40 mg total) by mouth daily. 05/18/18 12/14/18  Shelly Coss, MD  Polyethyl Glycol-Propyl Glycol (SYSTANE OP) Apply 1 drop to eye daily as needed (dry eyes).    [provider]  sodium chloride (OCEAN) 0.65 % SOLN nasal spray Place 1 spray into both nostrils as needed for congestion. Patient taking differently: Place 1 spray into both nostrils daily as needed for congestion.  10/29/15   Cherene Altes, MD  VOTRIENT 200 MG tablet  02/05/19   [provider]    Current Facility-Administered Medications  Medication Dose Route Frequency Provider Last Rate Last Dose  . 0.9 %  sodium chloride infusion (Manually program via Guardrails IV Fluids)   Intravenous Once Charlesetta Shanks, MD   Stopped at 02/18/19 1333  . furosemide (LASIX) injection 20 mg  20 mg Intravenous Once Charlesetta Shanks, MD   Stopped at 02/18/19 1417  . pantoprazole (PROTONIX) 80 mg in sodium chloride 0.9 % 250 mL (0.32 mg/mL) infusion  8 mg/hr Intravenous Continuous Charlesetta Shanks, MD 25 mL/hr at 02/18/19 1236 8 mg/hr at  02/18/19 1236   Current Outpatient Medications  Medication Sig Dispense Refill  . Albuterol Sulfate (PROAIR RESPICLICK) 154 (90 Base) MCG/ACT AEPB Inhale 2 puffs into the lungs every 6 (six) hours as needed (shortness of breath). 1 each 6  . ALPRAZolam (XANAX) 0.5 MG tablet Take 1 tablet (0.5 mg total) by mouth at bedtime. 30 tablet 2  . Ascorbic Acid (VITAMIN C) 1000 MG tablet Take 1,000 mg by mouth daily.    . budesonide (PULMICORT) 0.25 MG/2ML nebulizer solution Take 2 mLs (0.25 mg total) by nebulization 2 (two) times daily. 120 mL 5  . cholecalciferol (VITAMIN D) 400 units TABS tablet Take 400 Units by mouth at bedtime.    . Ferrous Sulfate (IRON) 325 (65 Fe) MG TABS Take 325 mg by mouth daily.    . Flaxseed, Linseed, (FLAX SEEDS PO) Take 1 capsule by mouth daily.    . furosemide (LASIX) 40 MG tablet Take 40 mg by mouth daily as needed for fluid.     [name] guaiFENesin (MUCINEX) 600 MG 12 hr tablet Take 2 tablets (1,200 mg total) by mouth 2 (two) times daily.    [name] ipratropium-albuterol (DUONEB) 0.5-2.5 (3) MG/3ML SOLN Take 3 mLs by nebulization every 4 (four) hours as needed. (Patient taking differently: Take 3 mLs by nebulization 4 (four) times daily. ) 340 mL 6  . isosorbide mononitrate (IMDUR) 30 MG 24 hr tablet Take 30 mg by mouth daily.    [name] lisinopril (PRINIVIL,ZESTRIL) 10 MG tablet Take 10 mg by mouth daily.    . methocarbamol (ROBAXIN) 500 MG tablet Take 1,000 mg by mouth at bedtime as needed for muscle spasms.   1  . multivitamin-lutein (OCUVITE-LUTEIN) CAPS capsule Take 1 capsule by mouth every evening.     . simvastatin (ZOCOR) 20 MG tablet Take 1 tablet (20 mg total) by mouth daily at 6 PM. 30 tablet 2  . vitamin B-12 (CYANOCOBALAMIN) 1000 MCG tablet Take 1,000 mcg by mouth daily.    [name] acetaminophen (TYLENOL) 325 MG tablet Take 2 tablets (650 mg total) by mouth every 6 (six) hours as needed for mild pain (or Fever >/= 101). (Patient not taking: Reported on 12/14/2018)    . fluticasone  (FLONASE) 50 MCG/ACT nasal spray Place 2 sprays into both nostrils daily as needed for allergies or rhinitis.  3  . nitroGLYCERIN (NITROSTAT) 0.4 MG SL tablet Place 1 tablet (0.4 mg total) under the tongue every 5 (five) minutes as needed for chest pain. 25 tablet 3  . pantoprazole (PROTONIX) 40 MG tablet Take 1 tablet (40 mg total) by mouth daily. 30 tablet 0  . Polyethyl Glycol-Propyl Glycol (SYSTANE OP) Apply 1 drop to eye daily as needed (dry eyes).    . sodium chloride (OCEAN) 0.65 % SOLN nasal spray Place 1 spray into both nostrils as needed for congestion. (Patient taking differently: Place 1 spray into both nostrils daily as needed for congestion. )  0  . VOTRIENT 200 MG tablet       Allergies as of 02/18/2019 - Review Complete 02/18/2019  Allergen Reaction Noted  . Amlodipine Swelling 10/22/2016    Family History  Problem Relation Age of Onset  . Heart disease Mother   . Heart disease Brother   . Cancer Brother   . Aneurysm Brother     Social History   Socioeconomic History  . Marital status: Married    Spouse name: [name]  . Number of children: 2  . Years of education: Not on file  . Highest education level: Not on file  Occupational History  . Occupation: Retired  Scientific laboratory technician  . Financial resource strain: Not on file  . Food insecurity:    Worry: Not on file    Inability: Not on file  . Transportation needs:    Medical: Not on file    Non-medical: Not on file  Tobacco Use  . Smoking status: Former Smoker    Packs/day: 2.00    Years: 45.00    Pack years: 90.00    Types: Cigarettes    Last attempt to quit: 10/17/2015    Years since quitting: 3.3  . Smokeless tobacco: Never Used  Substance and Sexual Activity  . Alcohol use: Yes    Alcohol/week: 0.0 standard drinks    Comment: glass of wine at night  . Drug use: No  . Sexual activity: Not on file  Lifestyle  . Physical activity:    Days per week: Not on file    Minutes per session: Not on file  .  Stress: Not on file  Relationships  . Social connections:    Talks on phone: Not on file    Gets together: Not on file    Attends religious service: Not on file    Active member of club or organization: Not on file    Attends meetings of clubs or organizations: Not on file    Relationship status: Not on file  . Intimate partner violence:    Fear of current or ex partner: Not on file    Emotionally abused: Not on file    Physically abused: Not on file    Forced sexual activity: Not on file  Other Topics Concern  . Not on file  Social History Narrative  . Not on file    Review of Systems: Pertinent positive and negative review of systems were noted in the above HPI section.  All other review of systems was otherwise negative. Physical Exam: Vital signs in last 24 hours: Temp:  [97.9 F (36.6 C)] 97.9 F (36.6 C) (04/05 1110) Pulse Rate:  [98-115] 104 (04/05 1400) Resp:  [13-24] 16 (04/05 1400) BP: (110-125)/(57-70) 117/61 (04/05 1400) SpO2:  [100 %] 100 % (04/05 1400) Weight:  [97.5 kg] 97.5 kg (04/05 1111)   General:   Alert,  Well-developed, chronically ill-appearing  older white male pleasant and cooperative in NAD.  Talkative despite being dyspneic.  He is currently on 6 L nasal cannula Head:  Normocephalic and atraumatic. Eyes:  Sclera clear, no icterus.   Conjunctiva pale. Ears:  Normal auditory acuity. Nose:  No deformity, discharge,  or lesions. Mouth:  No deformity or lesions.   Neck:  Supple; no masses or thyromegaly. Lungs:  Clear throughout to auscultation.   Creased breath sounds bilaterally no wheezes rhonchi  heart:  Regular rate and rhythm; no murmurs, clicks, rubs,  or gallops. Abdomen:  Soft, large, protuberant, bowel sounds are present He has a huge palpable mass in the right lower abdomen extending to the midline and umbilical area occupying the entire right lower quadrant, this is tender with palpation, I cannot definitely palpate his spleen Rectal:   Deferred -melena documented per ER MD Msk:  Symmetrical without gross deformities. . Pulses:  Normal pulses noted. Extremities:  Without clubbing or edema. Neurologic:  Alert and  oriented x4;  grossly normal neurologically. Skin:  Intact without significant lesions or rashes.. Psych:  Alert and cooperative. Normal mood and affect.  Intake/Output from previous day: No intake/output data recorded. Intake/Output this shift: No intake/output data recorded.  Lab Results: Recent Labs    02/18/19 1215  WBC 9.0  HGB 4.4*  HCT 14.5*  PLT 191   BMET Recent Labs    02/18/19 1215  NA 135  K 4.0  CL 104  CO2 21*  GLUCOSE 181*  BUN 28*  CREATININE 0.93  CALCIUM 8.1*   LFT Recent Labs    02/18/19 1215  PROT 6.3*  ALBUMIN 3.2*  AST 46*  ALT 23  ALKPHOS 51  BILITOT 2.1*   PT/INR Recent Labs    02/18/19 1215  LABPROT 14.2  INR 1.1     IMPRESSION:  #53 73 year old white male admitted with profound anemia, acute on chronic with history of melena x3 days. Patient has a known huge intra-abdominal spindle cell cancer/sarcoma occupying the entire right lower abdomen, and on previous imaging to be abutting several loops of small bowel. Patient had a GI bleed in July 2019, was transferred to [facility] for management.  Underwent colonoscopy there which did not show any evidence of tumor involvement of the colon.  He does have known descending colon and sigmoid: Diverticulosis. CT angios of the abdomen was subsequently done showing a hypertrophic branch of the SMA be supplying the large right lower quadrant tumor.  At the time of the study there was no active extravasation and embolization was not done.  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  He is status post TACE procedure last year to a hepatic metastases.  His recent imaging has shown a new hepatic lobe metastasis   #3 Severe COPD-O2 dependent on 4 to 6 L chronically at home and requiring  BiPAP when he sleeps #4 congestive heart failure #5.  Coronary artery disease status post prior stents-no current aspirin or Plavix-has demand ischemia changes on EKG today and elevated troponin doubt non-STEMI #6.  Hypertension #7 splenomegaly #8 history of adenomatous colon polyps  PLAN: #1 patient is to be transfused 2 units of RBCs today, and will probably need an additional 2 units to get him to hemoglobin between 8 and 9.  He cannot go home with hemoglobin of 7 as he  is extremely symptomatic as outlined above. #2 no current role for endoscopic evaluation.  Previous studies have shown the tumor to be abutting  multiple loops of small bowel.  Patient is an extremely poor endoscopic candidate his severe cardiopulmonary disease. #3  if he has evidence of ongoing bleeding over the next 24 to 48 hours will need CT Angie oh with possible embolization.  Previous angios has shown ranch of the SMA to be supplying his large right lower abdominal tumor. #4  serial hemoglobins #5  full liquid diet  Thank you- will follow with you   Amy Esterwood PA-C 02/18/2019, 2:36 PM   ________________________________________________________________________  Velora Heckler GI MD note:  I personally examined the patient, reviewed the data and agree with the assessment and plan described above. I saw him just after he got to his room on the 5th floor, he has not yet started blood transfusion.  I presume his bleeding is related to the very large abd tumor which probably invades his small bowel somewhere along it's course since 2018 colonoscopy at [facility] suggested no colon involvement.  The mass is known to be supplied by a branch of the SMA.  He believes the recent bleeding to have already stopped, last output Friday was 'old blood' he thought.  Plan for now is supportive with blood transfusions and oxygen (normally he is on 4-6LNC for severe COPD).  If we suspect him to be actively bleeding again then he will need CT angio or  direct to IR for actual angio. Will follow along.    Owens Loffler, MD [facility] Pager [phone]

## 2019-02-18 NOTE — ED Notes (Signed)
Both sets of cultures collected with both IV starts.

## 2019-02-18 NOTE — ED Notes (Signed)
Bed: HH83 Expected date:  Expected time:  Means of arrival:  Comments: Rectal bleed

## 2019-02-18 NOTE — ED Notes (Signed)
Attempted report. Will try again.

## 2019-02-19 ENCOUNTER — Inpatient Hospital Stay (HOSPITAL_COMMUNITY): Payer: Medicare Other

## 2019-02-19 DIAGNOSIS — C499 Malignant neoplasm of connective and soft tissue, unspecified: Secondary | ICD-10-CM

## 2019-02-19 DIAGNOSIS — R109 Unspecified abdominal pain: Secondary | ICD-10-CM

## 2019-02-19 DIAGNOSIS — Z515 Encounter for palliative care: Secondary | ICD-10-CM

## 2019-02-19 DIAGNOSIS — Z7189 Other specified counseling: Secondary | ICD-10-CM

## 2019-02-19 LAB — COMPREHENSIVE METABOLIC PANEL
ALT: 25 U/L (ref 0–44)
AST: 59 U/L — ABNORMAL HIGH (ref 15–41)
Albumin: 3.2 g/dL — ABNORMAL LOW (ref 3.5–5.0)
Alkaline Phosphatase: 50 U/L (ref 38–126)
Anion gap: 5 (ref 5–15)
BUN: 23 mg/dL (ref 8–23)
CO2: 25 mmol/L (ref 22–32)
Calcium: 7.8 mg/dL — ABNORMAL LOW (ref 8.9–10.3)
Chloride: 102 mmol/L (ref 98–111)
Creatinine, Ser: 0.84 mg/dL (ref 0.61–1.24)
GFR calc Af Amer: >60 mL/min (ref >60)
GFR calc non Af Amer: >60 mL/min (ref >60)
Glucose, Bld: 147 mg/dL — ABNORMAL HIGH (ref 70–99)
Potassium: 3.8 mmol/L (ref 3.5–5.1)
Sodium: 132 mmol/L — ABNORMAL LOW (ref 135–145)
Total Bilirubin: 2.3 mg/dL — ABNORMAL HIGH (ref 0.3–1.2)
Total Protein: 6 g/dL — ABNORMAL LOW (ref 6.5–8.1)

## 2019-02-19 LAB — CBC WITH DIFFERENTIAL/PLATELET
Abs Immature Granulocytes: 0.43 10*3/uL — ABNORMAL HIGH (ref 0.00–0.07)
Basophils Absolute: 0 10*3/uL (ref 0.0–0.1)
Basophils Relative: 0 %
Eosinophils Absolute: 0.1 10*3/uL (ref 0.0–0.5)
Eosinophils Relative: 2 %
HCT: 18.7 % — ABNORMAL LOW (ref 39.0–52.0)
Hemoglobin: 5.9 g/dL — CL (ref 13.0–17.0)
Immature Granulocytes: 5 %
Lymphocytes Relative: 11 %
Lymphs Abs: 0.9 10*3/uL (ref 0.7–4.0)
MCH: 29.1 pg (ref 26.0–34.0)
MCHC: 31.6 g/dL (ref 30.0–36.0)
MCV: 92.1 fL (ref 80.0–100.0)
Monocytes Absolute: 0.7 10*3/uL (ref 0.1–1.0)
Monocytes Relative: 9 %
Neutro Abs: 6.1 10*3/uL (ref 1.7–7.7)
Neutrophils Relative %: 73 %
Platelets: 155 10*3/uL (ref 150–400)
RBC: 2.03 MIL/uL — ABNORMAL LOW (ref 4.22–5.81)
RDW: 23 % — ABNORMAL HIGH (ref 11.5–15.5)
WBC: 8.3 10*3/uL (ref 4.0–10.5)
nRBC: 1.5 % — ABNORMAL HIGH (ref 0.0–0.2)

## 2019-02-19 LAB — HEMOGLOBIN AND HEMATOCRIT, BLOOD
HCT: 23.7 % — ABNORMAL LOW (ref 39.0–52.0)
HCT: 22.6 % — ABNORMAL LOW (ref 39.0–52.0)
Hemoglobin: 7.8 g/dL — ABNORMAL LOW (ref 13.0–17.0)
Hemoglobin: 7.5 g/dL — ABNORMAL LOW (ref 13.0–17.0)

## 2019-02-19 LAB — GLUCOSE, CAPILLARY
Glucose-Capillary: 149 mg/dL — ABNORMAL HIGH (ref 70–99)
Glucose-Capillary: 137 mg/dL — ABNORMAL HIGH (ref 70–99)
Glucose-Capillary: 123 mg/dL — ABNORMAL HIGH (ref 70–99)
Glucose-Capillary: 151 mg/dL — ABNORMAL HIGH (ref 70–99)

## 2019-02-19 LAB — PREPARE RBC (CROSSMATCH)

## 2019-02-19 LAB — HEMOGLOBIN A1C
Hgb A1c MFr Bld: 5.7 % — ABNORMAL HIGH (ref 4.8–5.6)
Mean Plasma Glucose: 116.89 mg/dL

## 2019-02-19 LAB — TROPONIN I
Troponin I: 5.43 ng/mL (ref <0.03)
Troponin I: 5.53 ng/mL (ref <0.03)

## 2019-02-19 MED ORDER — HYDROMORPHONE HCL 1 MG/ML IJ SOLN: 0.5000 mg | INTRAMUSCULAR | Status: DC | PRN

## 2019-02-19 MED ORDER — IPRATROPIUM-ALBUTEROL 0.5-2.5 (3) MG/3ML IN SOLN
3.0000 mL | Freq: Four times a day (QID) | RESPIRATORY_TRACT | Status: DC
  Administered 2019-02-19 – 2019-02-21 (×9): 3 mL via RESPIRATORY_TRACT
  Filled 2019-02-19 (×8): qty 3

## 2019-02-19 MED ORDER — SODIUM CHLORIDE 0.9 % IV SOLN
510.0000 mg | Freq: Once | INTRAVENOUS | Status: AC
  Administered 2019-02-20: 510 mg via INTRAVENOUS
  Filled 2019-02-19: qty 17

## 2019-02-19 MED ORDER — SODIUM CHLORIDE 0.9% IV SOLUTION: Freq: Once | INTRAVENOUS | Status: DC

## 2019-02-19 MED ORDER — FUROSEMIDE 10 MG/ML IJ SOLN
20.0000 mg | Freq: Once | INTRAMUSCULAR | Status: AC
  Administered 2019-02-19: 20 mg via INTRAVENOUS
  Filled 2019-02-19: qty 2

## 2019-02-19 MED ORDER — ALPRAZOLAM 0.5 MG PO TABS: 0.5000 mg | ORAL_TABLET | Freq: Three times a day (TID) | ORAL | Status: DC | PRN

## 2019-02-19 MED ORDER — METHOCARBAMOL 500 MG PO TABS
500.0000 mg | ORAL_TABLET | Freq: Once | ORAL | Status: AC
  Administered 2019-02-19: 500 mg via ORAL
  Filled 2019-02-19: qty 1

## 2019-02-19 NOTE — Progress Notes (Signed)
CRITICAL VALUE ALERT  Critical Value: Hgb- 5.9  Date & Time Notied:  02/19/2019 0505  Provider Notified:x. blount  Orders Received/Actions taken:

## 2019-02-19 NOTE — Progress Notes (Signed)
Pt. aware to notify if help needed, RT plan to recheck on rounds.

## 2019-02-19 NOTE — Progress Notes (Signed)
Pt ekg abnormal, provider notified. No actions ordered per provider X. Blount.

## 2019-02-19 NOTE — Progress Notes (Signed)
[facility] Progress Note   Chief Complaint:   GI bleed  SUBJECTIVE:    No BMs / melena today. Feels okay. Last BM was yesterday and he describes it as "old looking blood".    ASSESSMENT AND PLAN:   1. 73 yo male with multiple medical problems not limited to severe COPD, CAD, chronic dCHF admitted with profound anemia / melena. with /p 2 units PRBC yesterday with suboptimal response of 4.4 >>> 5.9. Since received a 3rd units, post H+H pending. No further melena. Bleeding suspected to be related to invasion of abdominal tumor into small bowel. Of note, colonoscopy in 2018 at [facility] negative for colonic involvement.  -Await post transfusion H+H. Bleeding seems to have stopped. 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 If he rebleeds then will need CT angio / possible embolization.    2. Severe COPD, 02 dependent  3. Elevated troponin, suspected to be demand ischemia related to profound anemia. No chest pain. Non-specific ST-T changes on previous EKG. For repeat EKG today ) post transfusion.    OBJECTIVE:     Vital signs in last 24 hours: Temp:  [97.7 F (36.5 C)-99 F (37.2 C)] 98.8 F (37.1 C) (04/06 1004) Pulse Rate:  [87-123] 92 (04/06 1004) Resp:  [16-24] 22 (04/06 1004) BP: (103-134)/(60-79) 134/79 (04/06 1004) SpO2:  [98 %-100 %] 99 % (04/06 1140)   General:   Alert male in NAD EENT:  Normal hearing, non icteric sclera, conjunctive pink.  Heart:  Regular rate and rhythm; no murmur.  No lower extremity edema   Pulm: Normal respiratory effort Abdomen:  Soft, protuberant, non-tender. Nondistended. Large mass involving RLQ and periumbilical area.  Normal bowel sounds, no masses felt.       Neurologic:  Alert and  oriented x4;  grossly normal neurologically. Psych:  Pleasant, cooperative.  Normal mood and affect.   Intake/Output from previous day: 04/05 0701 - 04/06 0700 In: 1198.9 [I.V.:856.9; Blood:342] Out: -  Intake/Output this shift:  Total I/O In: 100 [P.O.:100] Out: 600 [Urine:600]  Lab Results: Recent Labs    02/18/19 1215 02/19/19 0404  WBC 9.0 8.3  HGB 4.4* 5.9*  HCT 14.5* 18.7*  PLT 191 155   BMET Recent Labs    02/18/19 1215 02/19/19 0404  NA 135 132*  K 4.0 3.8  CL 104 102  CO2 21* 25  GLUCOSE 181* 147*  BUN 28* 23  CREATININE 0.93 0.84  CALCIUM 8.1* 7.8*   LFT Recent Labs    02/19/19 0404  PROT 6.0*  ALBUMIN 3.2*  AST 59*  ALT 25  ALKPHOS 50  BILITOT 2.3*   PT/INR Recent Labs    02/18/19 1215  LABPROT 14.2  INR 1.1   Hepatitis Panel No results for input(s): HEPBSAG, HCVAB, HEPAIGM, HEPBIGM in the last 72 hours.  Dg Chest Port 1 View  Result Date: 02/18/2019 CLINICAL DATA:  Shortness of breath and abdominal pain. History of malignancy. EXAM: PORTABLE CHEST 1 VIEW COMPARISON:  01/22/2017; 01/19/2017; 09/26/2016; chest CT-02/17/2017 FINDINGS: Grossly unchanged cardiac silhouette and mediastinal contours with atherosclerotic plaque when thoracic aorta. The lungs remain hyperexpanded with thinning of the biapical pulmonary parenchyma and bibasilar linear heterogeneous opacities, left greater than right. No pleural effusion or pneumothorax. No evidence of edema. Apparent development of bilateral nodular opacities with dominant nodular opacity overlying the peripheral aspect right upper lung measuring 1.6 cm and dominant nodular opacity overlying the left mid lung measuring 1.1 cm. IMPRESSION: 1.  Interval apparent development of indeterminate bilateral nodular opacities, which given provided history of malignancy could represent metastasis. Further evaluation with chest CT could be performed as indicated. 2. Otherwise, similar findings of marked lung hyperexpansion without superimposed acute cardiopulmonary disease. Electronically Signed   By: Sandi Mariscal M.D.   On: 02/18/2019 12:25    Active Problems:   GI bleed     LOS: 1 day   Tye Savoy ,NP 02/19/2019, 12:07 PM

## 2019-02-19 NOTE — Consult Note (Signed)
Consultation Note Date: 02/19/2019   Patient Name: [name]  DOB: May 10, 1946  MRN: [id number]  Age / Sex: 73 y.o., male  PCP: [name], MD Referring Physician: Bonnell Public, MD  Reason for Consultation: Establishing goals of care  HPI/Patient Profile: 72 y.o. 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  Patient presented with rectal bleeding.  On presentation, Hemoglobin was 4.4g/dl and his troponins were also elevated.   Patient underwent PRBC transfusions, has been seen by GI. He is to undergo CT angio/ embolization if he has further bleeding.   Patient was seen by PMT in a previous hospitalization. For some of his cancer care, he has been followed at [facility] as well.   The patient is resting in bed. He has diffuse abdominal distension and tenderness. He was given Lasix earlier today, he complains of supra pubic pressure and believes he'd like to sit at the edge of the bed to use his urinal. He states, "I don't feel worth a hoot right now."   Chart has been reviewed in detail, also discussed with patient's nursing staff.   Please note additional recommendations below, PMT to continue to follow.   NEXT OF KIN  wife.   SUMMARY OF RECOMMENDATIONS   Patient is a partial code as per his wishes at time of admission.  Add low dose IV Dilaudid PRN for severe pain, continue to monitor.  Will need ongoing goals of care discussions, based on his hospital course and his overall disease trajectory. Patient with urinary urgency, bladder discomfort today, not in a place to further discuss overall goals of care, PMT to continue to  follow along.  Thank you for the consult.   Code Status/Advance Care Planning:  Limited code    Symptom Management:    as above   Palliative Prophylaxis:   Delirium Protocol   Psycho-social/Spiritual:   Desire for further Chaplaincy support:yes  Additional Recommendations: Caregiving  Support/Resources  Prognosis:   Unable to determine  Discharge Planning: To Be Determined      Primary Diagnoses: Present on Admission: . GI bleed   I have reviewed the medical record, interviewed the patient and family, and examined the patient. The following aspects are pertinent.  Past Medical History:  Diagnosis Date  . Anxiety   . Bladder cancer ([place]) 2011  . Borderline diabetes mellitus   . CAD (coronary artery disease)   . Cigarette smoker   . COPD (chronic obstructive pulmonary disease) ([place])   . DJD (degenerative joint disease)   . Emphysema of lung ([place])   . Epistaxis   . History of colonic polyps 2004   hyperplastic   . History of sinusitis   . Hypercholesteremia   . Hypertension   . Obesity    Social History   Socioeconomic  History  . Marital status: Married    Spouse name: [name]  . Number of children: 2  . Years of education: Not on file  . Highest education level: Not on file  Occupational History  . Occupation: Retired  Scientific laboratory technician  . Financial resource strain: Not on file  . Food insecurity:    Worry: Not on file    Inability: Not on file  . Transportation needs:    Medical: Not on file    Non-medical: Not on file  Tobacco Use  . Smoking status: Former Smoker    Packs/day: 2.00    Years: 45.00    Pack years: 90.00    Types: Cigarettes    Last attempt to quit: 10/17/2015    Years since quitting: 3.3  . Smokeless tobacco: Never Used  Substance and Sexual Activity  . Alcohol use: Yes    Alcohol/week: 0.0 standard drinks    Comment: glass of wine at night  . Drug use: No  . Sexual activity: Not on file  Lifestyle  . Physical activity:     Days per week: Not on file    Minutes per session: Not on file  . Stress: Not on file  Relationships  . Social connections:    Talks on phone: Not on file    Gets together: Not on file    Attends religious service: Not on file    Active member of club or organization: Not on file    Attends meetings of clubs or organizations: Not on file    Relationship status: Not on file  Other Topics Concern  . Not on file  Social History Narrative  . Not on file   Family History  Problem Relation Age of Onset  . Heart disease Mother   . Heart disease Brother   . Cancer Brother   . Aneurysm Brother    Scheduled Meds: . sodium chloride   Intravenous Once  . sodium chloride   Intravenous Once  . ALPRAZolam  0.5 mg Oral QHS  . budesonide  0.25 mg Nebulization BID  . cholecalciferol  400 Units Oral QHS  . insulin aspart  0-5 Units Subcutaneous QHS  . insulin aspart  0-9 Units Subcutaneous TID WC  . ipratropium-albuterol  3 mL Nebulization QID  . simvastatin  20 mg Oral q1800  . vitamin B-12  1,000 mcg Oral Daily   Continuous Infusions: . pantoprozole (PROTONIX) infusion 8 mg/hr (02/19/19 0247)   PRN Meds:.acetaminophen, albuterol, ALPRAZolam, fluticasone, HYDROmorphone (DILAUDID) injection, ondansetron (ZOFRAN) IV, sodium chloride flush Medications Prior to Admission:  Prior to Admission medications   Medication Sig Start Date End Date Taking? Authorizing Provider  Albuterol Sulfate (PROAIR RESPICLICK) 924 (90 Base) MCG/ACT AEPB Inhale 2 puffs into the lungs every 6 (six) hours as needed (shortness of breath). 10/26/18  Yes Noralee Space, MD  ALPRAZolam Duanne Moron) 0.5 MG tablet Take 1 tablet (0.5 mg total) by mouth at bedtime. 10/26/18  Yes Noralee Space, MD  Ascorbic Acid (VITAMIN C) 1000 MG tablet Take 1,000 mg by mouth daily.   Yes [provider]  budesonide (PULMICORT) 0.25 MG/2ML nebulizer solution Take 2 mLs (0.25 mg total) by nebulization 2 (two) times daily. 02/12/19   Yes Icard, Leory Plowman L, DO  cholecalciferol (VITAMIN D) 400 units TABS tablet Take 400 Units by mouth at bedtime.   Yes [provider]  Ferrous Sulfate (IRON) 325 (65 Fe) MG TABS Take 325 mg by mouth daily.   Yes [provider]  Flaxseed, Linseed, (FLAX SEEDS PO) Take 1 capsule by mouth daily.   Yes [provider]  furosemide (LASIX) 40 MG tablet Take 40 mg by mouth daily as needed for fluid.    Yes [provider]  guaiFENesin (MUCINEX) 600 MG 12 hr tablet Take 2 tablets (1,200 mg total) by mouth 2 (two) times daily. 06/11/18  Yes Mariel Aloe, MD  ipratropium-albuterol (DUONEB) 0.5-2.5 (3) MG/3ML SOLN Take 3 mLs by nebulization every 4 (four) hours as needed. Patient taking differently: Take 3 mLs by nebulization 4 (four) times daily.  02/12/19  Yes Icard, Bradley L, DO  isosorbide mononitrate (IMDUR) 30 MG 24 hr tablet Take 30 mg by mouth daily.   Yes [provider]  lisinopril (PRINIVIL,ZESTRIL) 10 MG tablet Take 10 mg by mouth daily.   Yes [provider]  methocarbamol (ROBAXIN) 500 MG tablet Take 1,000 mg by mouth at bedtime as needed for muscle spasms.  03/07/18  Yes [provider]  multivitamin-lutein (OCUVITE-LUTEIN) CAPS capsule Take 1 capsule by mouth every evening.    Yes [provider]  simvastatin (ZOCOR) 20 MG tablet Take 1 tablet (20 mg total) by mouth daily at 6 PM. 11/19/15  Yes [place], Nicki Reaper T, PA-C  vitamin B-12 (CYANOCOBALAMIN) 1000 MCG tablet Take 1,000 mcg by mouth daily.   Yes [provider]  acetaminophen (TYLENOL) 325 MG tablet Take 2 tablets (650 mg total) by mouth every 6 (six) hours as needed for mild pain (or Fever >/= 101). Patient not taking: Reported on 12/14/2018 06/11/18   Mariel Aloe, MD  fluticasone [facility]) 50 MCG/ACT nasal spray Place 2 sprays into both nostrils daily as needed for allergies or rhinitis.  09/21/18   [provider]  nitroGLYCERIN (NITROSTAT) 0.4 MG  SL tablet Place 1 tablet (0.4 mg total) under the tongue every 5 (five) minutes as needed for chest pain. 11/19/15   Richardson Dopp T, PA-C  pantoprazole (PROTONIX) 40 MG tablet Take 1 tablet (40 mg total) by mouth daily. 05/18/18 12/14/18  Shelly Coss, MD  Polyethyl Glycol-Propyl Glycol (SYSTANE OP) Apply 1 drop to eye daily as needed (dry eyes).    [provider]  sodium chloride (OCEAN) 0.65 % SOLN nasal spray Place 1 spray into both nostrils as needed for congestion. Patient taking differently: Place 1 spray into both nostrils daily as needed for congestion.  10/29/15   Cherene Altes, MD  VOTRIENT 200 MG tablet  02/05/19   [provider]   Allergies  Allergen Reactions  . Amlodipine Swelling   Review of Systems Abdominal pain  Physical Exam  awake alert In distress due to abdominal pain, pressure over his supra pubic area Patient has a large mass R side of his abdomen Some edema Regular S1 S 2 Non focal  Vital Signs: BP 133/68 (BP Location: Right Arm)   Pulse 97   Temp 99 F (37.2 C) (Oral)   Resp (!) 24   Wt 97.5 kg   SpO2 99%   BMI 34.70 kg/m  Pain Scale: 0-10   Pain Score: 0-No pain   SpO2: SpO2: 99 % O2 Device:SpO2: 99 % O2 Flow Rate: .O2 Flow Rate (L/min): 6 L/min  IO: Intake/output summary:   Intake/Output Summary (Last 24 hours) at 02/19/2019 1424 Last data filed at 02/19/2019 1324 Gross per 24 hour  Intake 1908.89 ml  Output 1300 ml  Net 608.89 ml    LBM:   Baseline Weight: Weight: 97.5 kg Most  recent weight: Weight: 97.5 kg     Palliative Assessment/Data:   Flowsheet Rows     Most Recent Value  Intake Tab  Referral Department  Hospitalist  Unit at Time of Referral  Oncology Unit  Palliative Care Primary Diagnosis  Cancer  Palliative Care Type  Return patient Palliative Care  Reason for referral  Clarify Goals of Care  Date first seen by Palliative Care  02/19/19  Clinical Assessment  Palliative Performance Scale Score   40%  Pain Max last 24 hours  8  Pain Min Last 24 hours  5  Dyspnea Max Last 24 Hours  4  Dyspnea Min Last 24 hours  3  Nausea Max Last 24 Hours  4  Nausea Min Last 24 Hours  3  Anxiety Max Last 24 Hours  7  Anxiety Min Last 24 Hours  5  Psychosocial & Spiritual Assessment  Palliative Care Outcomes      Time In:  1300 Time Out:  1400 Time Total:   60  Greater than 50%  of this time was spent counseling and coordinating care related to the above assessment and plan.  Signed by: Loistine Chance, MD  [phone] Please contact Palliative Medicine Team phone at [phone] for questions and concerns.  For individual provider: See Shea Evans

## 2019-02-19 NOTE — Progress Notes (Signed)
CRITICAL VALUE ALERT  Critical Value:  Troponin 5.43  Date & Time Notied:  02/19/2019 0446  Provider Notified: Kennon Holter, NP   Orders Received/Actions taken:

## 2019-02-19 NOTE — Progress Notes (Signed)
PT Cancellation Note  Patient Details Name: [name] MRN: [id number] DOB: 1946/05/05   Cancelled Treatment:    Reason Eval/Treat Not Completed: Medical issues which prohibited therapy - Pt with troponin of 5.43 and Hgb of 5.9. Pt not medically ready for PT at this time, PT to check back at a later time.  Julien Girt, PT Acute Rehabilitation Services Pager [phone]  Office [phone]    Roxine Caddy D Elonda Husky 02/19/2019, 9:31 AM

## 2019-02-19 NOTE — Progress Notes (Signed)
Pt. checked for BiPAP placement, tolerating BiPAP well, Oxygen remains inplace.

## 2019-02-19 NOTE — Progress Notes (Signed)
Notified by tele, patient had 5 beat run PVC. Will notify on call.

## 2019-02-19 NOTE — Progress Notes (Signed)
PROGRESS NOTE    [name]  YTK:[id number] DOB: 1946-08-13 DOA: 02/18/2019 PCP: [name], MD  Outpatient Specialists:   Brief Narrative:  [name] is a 73 y.o. 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  Patient presented with rectal bleeding.  On presentation, Hemoglobin was 4.4g/dl and troponin was 1.47. Troponin has trended up to 5.43. Patient has been transfused with 3 units of PRBC.  Last hemoglobin was 7.8g/dl.  No chest pain reported.  GI input is appreciated.  Assessment & Plan:   Active Problems:   GI bleed  Rectal bleed with prior history of GI bleed: Presented with 3 days of intermittent bright red blood per rectum Follows with GI at [facility] Hemoglobin of 4.4 with positive FOBT on presentation Baseline hemoglobin 8.0 GI input is appreciated. Patient has been transfused with 3 units of PRBC. Last hemoglobin checked was 7.8g/dl.  Elevated troponin, likely related to blood loss: Presented with troponin of 1.47 and hemoglobin of 4.4. Troponin has trended up to 2.71 and 5.43. Serial EKG findings noted. No further chest pain reported.  Severe anemia secondary to acute blood loss from GI bleed Last Hemoglobin was 7.8g/dl Continue to monitor hemoglobin.  Hyperbilirubinemia Last bilirubin was 2.3. History of metastasis to the liver. Mildly elevated AST. Normal alkaline phosphatase and ALT.   Elevated lactic acid Presented with lactic acid of 2.4 Repeat lactic acid was 1.7.  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  Chronic anxiety PRN Xanax.  Chronic diastolic CHF Last 2D echo done on 01/20/2017 revealed LVEF 55 to 60% with no regional wall abnormalities Strict I's and O's. Continue daily weight. Resume cardiac  medications.  OSA C/w BIPAP at night  Impaired Fasting glucose Hemoglibin A1c was 5.7% Heart healthy carb modified diet  DVT prophylaxis: SCDs Code Status: Limited Code: No CPR; Can intubate, as requested by the patient. Family Communication: None at bedside  Consults called: GI  Procedures:   None   Subjective: No chest pain Patient has chronic COPD/respiratory failure (?Baseline)  Objective: Vitals:   02/19/19 0829 02/19/19 0925 02/19/19 1004 02/19/19 1140  BP:   134/79   Pulse: 94  92   Resp: 20  (!) 22   Temp:   98.8 F (37.1 C)   TempSrc:  (P) Oral Oral   SpO2: 100%  100% 99%  Weight:        Intake/Output Summary (Last 24 hours) at 02/19/2019 1314 Last data filed at 02/19/2019 1234 Gross per 24 hour  Intake 1668.89 ml  Output 800 ml  Net 868.89 ml   Filed Weights   02/18/19 1111  Weight: 97.5 kg    Examination:  General exam: Morbidly obese.  Appears calm and comfortable. Respiratory system: Decreased air entry globally Cardiovascular system: S1 & S2 heard. Gastrointestinal system: Abdomen is morbidly obese, soft. Central nervous system: Awake and alert.  Patient moves all limbs.    Data Reviewed: I have personally reviewed following labs and imaging studies  CBC: Recent Labs  Lab 02/18/19 1215 02/19/19 0404 02/19/19 1225  WBC 9.0 8.3  --   NEUTROABS 6.8 6.1  --   HGB 4.4* 5.9* 7.8*  HCT 14.5* 18.7* 23.7*  MCV 102.8* 92.1  --   PLT 191 155  --    Basic Metabolic Panel: Recent Labs  Lab 02/18/19  1215 02/19/19 0404  NA 135 132*  K 4.0 3.8  CL 104 102  CO2 21* 25  GLUCOSE 181* 147*  BUN 28* 23  CREATININE 0.93 0.84  CALCIUM 8.1* 7.8*   GFR: Estimated Creatinine Clearance: 85.6 mL/min (by C-G formula based on SCr of 0.84 mg/dL). Liver Function Tests: Recent Labs  Lab 02/18/19 1215 02/19/19 0404  AST 46* 59*  ALT 23 25  ALKPHOS 51 50  BILITOT 2.1* 2.3*  PROT 6.3* 6.0*  ALBUMIN 3.2* 3.2*   Recent Labs  Lab 02/18/19  1215  LIPASE 27   No results for input(s): AMMONIA in the last 168 hours. Coagulation Profile: Recent Labs  Lab 02/18/19 1215  INR 1.1   Cardiac Enzymes: Recent Labs  Lab 02/18/19 1215 02/18/19 1644 02/19/19 0404 02/19/19 1225  TROPONINI 1.47* 2.71* 5.43* 5.53*   BNP (last 3 results) No results for input(s): PROBNP in the last 8760 hours. HbA1C: Recent Labs    02/19/19 0404  HGBA1C 5.7*   CBG: Recent Labs  Lab 02/18/19 1724 02/18/19 2151 02/19/19 0729 02/19/19 1156  GLUCAP 125* 152* 149* 137*   Lipid Profile: No results for input(s): CHOL, HDL, LDLCALC, TRIG, CHOLHDL, LDLDIRECT in the last 72 hours. Thyroid Function Tests: No results for input(s): TSH, T4TOTAL, FREET4, T3FREE, THYROIDAB in the last 72 hours. Anemia Panel: No results for input(s): VITAMINB12, FOLATE, FERRITIN, TIBC, IRON, RETICCTPCT in the last 72 hours. Urine analysis:    Component Value Date/Time   COLORURINE YELLOW 02/18/2019 1319   APPEARANCEUR CLEAR 02/18/2019 1319   LABSPEC 1.020 02/18/2019 1319   PHURINE 5.0 02/18/2019 1319   GLUCOSEU NEGATIVE 02/18/2019 1319   GLUCOSEU NEGATIVE 06/30/2010 1050   HGBUR NEGATIVE 02/18/2019 1319   BILIRUBINUR NEGATIVE 02/18/2019 1319   KETONESUR 5 (A) 02/18/2019 1319   PROTEINUR NEGATIVE 02/18/2019 1319   UROBILINOGEN 0.2 06/30/2010 1050   NITRITE NEGATIVE 02/18/2019 1319   LEUKOCYTESUR NEGATIVE 02/18/2019 1319   Sepsis Labs: @LABRCNTIP (procalcitonin:4,lacticidven:4)  ) Recent Results (from the past 240 hour(s))  Culture, blood (routine x 2)     Status: None (Preliminary result)   Collection Time: 02/18/19 12:35 PM  Result Value Ref Range Status   Specimen Description   Final    BLOOD RIGHT ANTECUBITAL Performed at [facility] [address], [place], [place] [address]    Special Requests   Final    BOTTLES DRAWN AEROBIC AND ANAEROBIC Blood Culture adequate volume Performed at [facility]  [address] [place], [place] [address]    Culture   Final    NO GROWTH < 12 HOURS Performed at [facility] [address], [place], [place] [address]    Report Status PENDING  Incomplete  Culture, blood (routine x 2)     Status: None (Preliminary result)   Collection Time: 02/18/19 12:40 PM  Result Value Ref Range Status   Specimen Description   Final    BLOOD LEFT ANTECUBITAL Performed at [facility] [address] [place], [place] [address]    Special Requests   Final    BOTTLES DRAWN AEROBIC AND ANAEROBIC Blood Culture adequate volume Performed at [facility] [address] [place], [place] [address]    Culture   Final    NO GROWTH < 12 HOURS Performed at [facility] [address], [place], [place] [address]    Report Status PENDING  Incomplete         Radiology  Studies: Dg Chest Port 1 View  Result Date: 02/18/2019 CLINICAL DATA:  Shortness of breath and abdominal pain. History of malignancy. EXAM: PORTABLE CHEST 1 VIEW COMPARISON:  01/22/2017; 01/19/2017; 09/26/2016; chest CT-02/17/2017 FINDINGS: Grossly unchanged cardiac silhouette and mediastinal contours with atherosclerotic plaque when thoracic aorta. The lungs remain hyperexpanded with thinning of the biapical pulmonary parenchyma and bibasilar linear heterogeneous opacities, left greater than right. No pleural effusion or pneumothorax. No evidence of edema. Apparent development of bilateral nodular opacities with dominant nodular opacity overlying the peripheral aspect right upper lung measuring 1.6 cm and dominant nodular opacity overlying the left mid lung measuring 1.1 cm. IMPRESSION: 1. Interval apparent development of indeterminate bilateral nodular opacities, which given provided history of malignancy could represent metastasis. Further evaluation with chest CT could be performed as indicated. 2. Otherwise, similar findings of marked lung hyperexpansion without  superimposed acute cardiopulmonary disease. Electronically Signed   By: Sandi Mariscal M.D.   On: 02/18/2019 12:25        Scheduled Meds: . sodium chloride   Intravenous Once  . sodium chloride   Intravenous Once  . ALPRAZolam  0.5 mg Oral QHS  . budesonide  0.25 mg Nebulization BID  . cholecalciferol  400 Units Oral QHS  . insulin aspart  0-5 Units Subcutaneous QHS  . insulin aspart  0-9 Units Subcutaneous TID WC  . ipratropium-albuterol  3 mL Nebulization QID  . simvastatin  20 mg Oral q1800  . vitamin B-12  1,000 mcg Oral Daily   Continuous Infusions: . pantoprozole (PROTONIX) infusion 8 mg/hr (02/19/19 0247)     LOS: 1 day    Time spent: [place]    Dana Allan, MD  [facility] Pager #: [phone] 7PM-7AM contact night coverage as above

## 2019-02-20 DIAGNOSIS — C801 Malignant (primary) neoplasm, unspecified: Secondary | ICD-10-CM

## 2019-02-20 DIAGNOSIS — R7989 Other specified abnormal findings of blood chemistry: Secondary | ICD-10-CM

## 2019-02-20 LAB — GLUCOSE, CAPILLARY
Glucose-Capillary: 157 mg/dL — ABNORMAL HIGH (ref 70–99)
Glucose-Capillary: 134 mg/dL — ABNORMAL HIGH (ref 70–99)
Glucose-Capillary: 153 mg/dL — ABNORMAL HIGH (ref 70–99)
Glucose-Capillary: 144 mg/dL — ABNORMAL HIGH (ref 70–99)

## 2019-02-20 LAB — HEMOGLOBIN AND HEMATOCRIT, BLOOD
HCT: 22.4 % — ABNORMAL LOW (ref 39.0–52.0)
Hemoglobin: 7.2 g/dL — ABNORMAL LOW (ref 13.0–17.0)

## 2019-02-20 LAB — PREPARE RBC (CROSSMATCH)

## 2019-02-20 LAB — TROPONIN I: Troponin I: 3.4 ng/mL (ref <0.03)

## 2019-02-20 MED ORDER — BUDESONIDE 0.25 MG/2ML IN SUSP
0.2500 mg | Freq: Two times a day (BID) | RESPIRATORY_TRACT | Status: DC
  Administered 2019-02-20 (×2): 0.25 mg via RESPIRATORY_TRACT
  Filled 2019-02-20 (×2): qty 2

## 2019-02-20 MED ORDER — METHOCARBAMOL 1000 MG/10ML IJ SOLN
500.0000 mg | Freq: Three times a day (TID) | INTRAVENOUS | Status: DC | PRN
  Filled 2019-02-20: qty 5

## 2019-02-20 MED ORDER — METHOCARBAMOL 500 MG PO TABS
500.0000 mg | ORAL_TABLET | Freq: Three times a day (TID) | ORAL | Status: DC | PRN
  Administered 2019-02-20: 500 mg via ORAL
  Filled 2019-02-20: qty 1

## 2019-02-20 MED ORDER — FUROSEMIDE 10 MG/ML IJ SOLN
20.0000 mg | INTRAMUSCULAR | Status: AC
  Administered 2019-02-20: 20 mg via INTRAVENOUS

## 2019-02-20 MED ORDER — SODIUM CHLORIDE 0.9% IV SOLUTION: Freq: Once | INTRAVENOUS | Status: DC

## 2019-02-20 MED ORDER — FUROSEMIDE 10 MG/ML IJ SOLN
INTRAMUSCULAR | Status: AC
  Administered 2019-02-20: 20 mg
  Filled 2019-02-20: qty 2

## 2019-02-20 NOTE — Progress Notes (Signed)
PROGRESS NOTE    [name]  UJW:[id number] DOB: 08-Dec-1945 DOA: 02/18/2019 PCP: [name], MD  Outpatient Specialists:   Brief Narrative:  [name] is a 73 y.o. 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  Patient presented with rectal bleeding.  On presentation, Hemoglobin was 4.4g/dl and troponin was 1.47. Troponin has trended up to 5.43. Patient has been transfused with 3 units of PRBC.  Last hemoglobin was 7.8g/dl.  No chest pain reported.  GI input is appreciated.  02/20/2019: Patient seen.  No new complaints.  Patient feels better today.  Last hemoglobin was 7.2 g/dL.  Will transfuse patient with 2 units of packed red blood cells today.  Will give IV Lasix prior to each unit of packed red blood cells.  Will repeat troponin.  Hopefully, patient will be discharged back home tomorrow if patient remains stable.  GI input is appreciated.  Assessment & Plan:   Active Problems:   GI bleed   Palliative care by specialist   Goals of care, counseling/discussion   Abdominal pain  Rectal bleed with prior history of GI bleed: Presented with 3 days of intermittent bright red blood per rectum Follows with GI at [facility] Hemoglobin of 4.4 with positive FOBT on presentation Baseline hemoglobin 8.0 GI input is appreciated. Patient has been transfused with 3 units of PRBC. Last hemoglobin checked was 7.8g/dl. 02/20/2019:  Last hemoglobin was 7.2 g/dL.  Will transfuse patient with 2 units of packed red blood cells today.  Will give IV Lasix prior to each unit of packed red blood cells.  Will repeat troponin.  Hopefully, patient will be discharged back home tomorrow if patient remains stable.  GI input is appreciated.   Elevated troponin, likely related to blood loss: Presented with troponin of 1.47 and  hemoglobin of 4.4. Troponin has trended up to 2.71 and 5.43. Serial EKG findings noted. No further chest pain reported. 02/20/2019: Repeat troponin today.  Patient is feeling better.  No chest pain.  As of breath is improving.   Severe anemia secondary to acute blood loss from GI bleed Last Hemoglobin was 7.8g/dl Continue to monitor hemoglobin.  Hyperbilirubinemia Last bilirubin was 2.3. History of metastasis to the liver. Mildly elevated AST. Normal alkaline phosphatase and ALT.   Elevated lactic acid Presented with lactic acid of 2.4 Repeat lactic acid was 1.7.  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  Chronic anxiety PRN Xanax.  Chronic diastolic CHF Last 2D echo done on 01/20/2017 revealed LVEF 55 to 60% with no regional wall abnormalities Strict I's and O's. Continue daily weight. Resume cardiac medications.  OSA C/w BIPAP at night  Impaired Fasting glucose Hemoglibin A1c was 5.7% Heart healthy carb modified diet  DVT prophylaxis: SCDs Code Status: Limited Code: No CPR; Can intubate, as requested by the patient. Family Communication: None at bedside  Consults called: GI  Procedures:   None   Subjective: No chest pain Shortness of breath is improving. Patient feels better.  Objective: Vitals:   02/19/19 2045 02/20/19 0521 02/20/19 0728 02/20/19 1136  BP:  126/74    Pulse: 95 87    Resp:  18    Temp:  (!) 96.5 F (35.8 C)    TempSrc:  Axillary    SpO2:  100% 98% 97%  Weight:  Intake/Output Summary (Last 24 hours) at 02/20/2019 1415 Last data filed at 02/20/2019 1252 Gross per 24 hour  Intake 1158.57 ml  Output 975 ml  Net 183.57 ml   Filed Weights   02/18/19 1111  Weight: 97.5 kg    Examination:  General exam: Morbidly obese.  Appears calm and comfortable. Respiratory system: Improved air entry.   Cardiovascular system: S1 & S2 heard. Gastrointestinal system: Abdomen is  morbidly obese, firm towards right lower abdominal quadrant area. Central nervous system: Awake and alert.  Patient moves all limbs.   Extremities: No leg edema.  Data Reviewed: I have personally reviewed following labs and imaging studies  CBC: Recent Labs  Lab 02/18/19 1215 02/19/19 0404 02/19/19 1225 02/19/19 2000 02/20/19 0510  WBC 9.0 8.3  --   --   --   NEUTROABS 6.8 6.1  --   --   --   HGB 4.4* 5.9* 7.8* 7.5* 7.2*  HCT 14.5* 18.7* 23.7* 22.6* 22.4*  MCV 102.8* 92.1  --   --   --   PLT 191 155  --   --   --    Basic Metabolic Panel: Recent Labs  Lab 02/18/19 1215 02/19/19 0404  NA 135 132*  K 4.0 3.8  CL 104 102  CO2 21* 25  GLUCOSE 181* 147*  BUN 28* 23  CREATININE 0.93 0.84  CALCIUM 8.1* 7.8*   GFR: Estimated Creatinine Clearance: 85.6 mL/min (by C-G formula based on SCr of 0.84 mg/dL). Liver Function Tests: Recent Labs  Lab 02/18/19 1215 02/19/19 0404  AST 46* 59*  ALT 23 25  ALKPHOS 51 50  BILITOT 2.1* 2.3*  PROT 6.3* 6.0*  ALBUMIN 3.2* 3.2*   Recent Labs  Lab 02/18/19 1215  LIPASE 27   No results for input(s): AMMONIA in the last 168 hours. Coagulation Profile: Recent Labs  Lab 02/18/19 1215  INR 1.1   Cardiac Enzymes: Recent Labs  Lab 02/18/19 1215 02/18/19 1644 02/19/19 0404 02/19/19 1225  TROPONINI 1.47* 2.71* 5.43* 5.53*   BNP (last 3 results) No results for input(s): PROBNP in the last 8760 hours. HbA1C: Recent Labs    02/19/19 0404  HGBA1C 5.7*   CBG: Recent Labs  Lab 02/19/19 1156 02/19/19 1656 02/19/19 2053 02/20/19 0809 02/20/19 1205  GLUCAP 137* 123* 151* 157* 134*   Lipid Profile: No results for input(s): CHOL, HDL, LDLCALC, TRIG, CHOLHDL, LDLDIRECT in the last 72 hours. Thyroid Function Tests: No results for input(s): TSH, T4TOTAL, FREET4, T3FREE, THYROIDAB in the last 72 hours. Anemia Panel: No results for input(s): VITAMINB12, FOLATE, FERRITIN, TIBC, IRON, RETICCTPCT in the last 72 hours. Urine  analysis:    Component Value Date/Time   COLORURINE YELLOW 02/18/2019 1319   APPEARANCEUR CLEAR 02/18/2019 1319   LABSPEC 1.020 02/18/2019 1319   PHURINE 5.0 02/18/2019 1319   GLUCOSEU NEGATIVE 02/18/2019 1319   GLUCOSEU NEGATIVE 06/30/2010 1050   HGBUR NEGATIVE 02/18/2019 1319   BILIRUBINUR NEGATIVE 02/18/2019 1319   KETONESUR 5 (A) 02/18/2019 1319   PROTEINUR NEGATIVE 02/18/2019 1319   UROBILINOGEN 0.2 06/30/2010 1050   NITRITE NEGATIVE 02/18/2019 1319   LEUKOCYTESUR NEGATIVE 02/18/2019 1319   Sepsis Labs: @LABRCNTIP (procalcitonin:4,lacticidven:4)  ) Recent Results (from the past 240 hour(s))  Culture, blood (routine x 2)     Status: None (Preliminary result)   Collection Time: 02/18/19 12:35 PM  Result Value Ref Range Status   Specimen Description   Final    BLOOD RIGHT ANTECUBITAL Performed at [facility], [address] [place], [place] [address]    Special Requests   Final    BOTTLES DRAWN AEROBIC AND ANAEROBIC Blood Culture adequate volume Performed at [facility] [address], [place], [place] [address]    Culture   Final    NO GROWTH 2 DAYS Performed at [facility] [address] [place], [place] [address]    Report Status PENDING  Incomplete  Culture, blood (routine x 2)     Status: None (Preliminary result)   Collection Time: 02/18/19 12:40 PM  Result Value Ref Range Status   Specimen Description   Final    BLOOD LEFT ANTECUBITAL Performed at [facility] [address], [place], [place] [address]    Special Requests   Final    BOTTLES DRAWN AEROBIC AND ANAEROBIC Blood Culture adequate volume Performed at [facility] [address], [place], [place] [address]    Culture   Final    NO GROWTH 2 DAYS Performed at [facility] [address], [place], [place] [address]    Report Status PENDING  Incomplete         Radiology Studies: Dg Chest Port 1  View  Result Date: 02/19/2019 CLINICAL DATA:  Shortness of breath, heavy breathing EXAM: PORTABLE CHEST 1 VIEW COMPARISON:  02/18/2019 FINDINGS: The lungs are hyperinflated likely secondary to COPD. There is no focal parenchymal opacity. There is no pleural effusion or pneumothorax. The heart and mediastinal contours are unremarkable. The osseous structures are unremarkable. IMPRESSION: No active disease. Electronically Signed   By: Kathreen Devoid   On: 02/19/2019 13:18        Scheduled Meds: . sodium chloride   Intravenous Once  . sodium chloride   Intravenous Once  . sodium chloride   Intravenous Once  . ALPRAZolam  0.5 mg Oral QHS  . budesonide  0.25 mg Nebulization BID  . cholecalciferol  400 Units Oral QHS  . insulin aspart  0-5 Units Subcutaneous QHS  . insulin aspart  0-9 Units Subcutaneous TID WC  . ipratropium-albuterol  3 mL Nebulization QID  . simvastatin  20 mg Oral q1800  . vitamin B-12  1,000 mcg Oral Daily   Continuous Infusions: . pantoprozole (PROTONIX) infusion 8 mg/hr (02/20/19 1135)     LOS: 2 days    Time spent: 94 Minutes    Dana Allan, MD  [facility] Pager #: [phone] 7PM-7AM contact night coverage as above

## 2019-02-20 NOTE — Progress Notes (Signed)
Daily Progress Note   Patient Name: Andrew [name]       Date: 02/20/2019 DOB: February 14, 1946  Age: 73 y.o. MRN#: [id number] Attending Physician: Andrew Public, MD Primary Care Physician: Andrew Hatchet, MD Admit Date: 02/18/2019  Reason for Consultation/Follow-up: Establishing goals of care  Subjective: I met today with Andrew [name].  He reports that he is feeling "a little better" today.  We had a long discussion regarding his clinical course with his cancer, respiratory difficulty, low hgb, and emotional distress of being seriously ill.  He feels that his overall situation declined when he changed his breathing treatments after "being on something that worked for years, then trying something new."  Reports that he feels he has had significant decline in his functional status since beginning of the year.   He feels that he may be reaching a point where "nothing is going to help for this cancer that I can afford to take" and his distress of having large copay for currrently ordered immunotherapy.  His wife called and reported that pills arrived via fedex today.  Reports that he is supposed to call his oncologist to discuss when to start taking based on his blood counts.  ength of Stay: 2  Current Medications: Scheduled Meds:  . sodium chloride   Intravenous Once  . sodium chloride   Intravenous Once  . sodium chloride   Intravenous Once  . ALPRAZolam  0.5 mg Oral QHS  . budesonide  0.25 mg Nebulization BID  . cholecalciferol  400 Units Oral QHS  . furosemide  20 mg Intravenous Q4H  . insulin aspart  0-5 Units Subcutaneous QHS  . insulin aspart  0-9 Units Subcutaneous TID WC  . ipratropium-albuterol  3 mL Nebulization QID  . simvastatin  20 mg Oral q1800  . vitamin B-12  1,000 mcg  Oral Daily    Continuous Infusions: . pantoprozole (PROTONIX) infusion 8 mg/hr (02/20/19 1135)    PRN Meds: acetaminophen, albuterol, ALPRAZolam, fluticasone, HYDROmorphone (DILAUDID) injection, methocarbamol, ondansetron (ZOFRAN) IV, sodium chloride flush  Physical Exam         General: Alert, awake, in no acute distress. Sitting in chair with mild increase WOB. Heart: Regular rate and rhythm. No murmur appreciated. Lungs: Decreased air movement Ext: No significant edema Skin: Warm and dry Neuro: Grossly intact, nonfocal.  Vital Signs: BP (!) 148/90 (BP Location: Right Arm)   Pulse 94   Temp 99.7 F (37.6 C) (Oral)   Resp 20   Wt 97.5 kg   SpO2 97%   BMI 34.70 kg/m  SpO2: SpO2: 97 % O2 Device: O2 Device: Room Air O2 Flow Rate: O2 Flow Rate (L/min): 6 L/min  Intake/output summary:   Intake/Output Summary (Last 24 hours) at 02/20/2019 2327 Last data filed at 02/20/2019 2300 Gross per 24 hour  Intake 1210 ml  Output 750 ml  Net 460 ml   LBM: Last BM Date: 02/20/19 Baseline Weight: Weight: 97.5 kg Most recent weight: Weight: 97.5 kg       Palliative Assessment/Data:    Flowsheet Rows     Most Recent Value  Intake Tab  Referral Department  Hospitalist  Unit at Time of Referral  Oncology Unit  Palliative Care Primary Diagnosis  Cancer  Palliative Care Type  Return patient Palliative Care  Reason for referral  Clarify Goals of Care  Date first seen by Palliative Care  02/19/19  Clinical Assessment  Palliative Performance Scale Score  40%  Pain Max last 24 hours  8  Pain Min Last 24 hours  5  Dyspnea Max Last 24 Hours  4  Dyspnea Min Last 24 hours  3  Nausea Max Last 24 Hours  4  Nausea Min Last 24 Hours  3  Anxiety Max Last 24 Hours  7  Anxiety Min Last 24 Hours  5  Psychosocial & Spiritual Assessment  Palliative Care Outcomes      Patient Active Problem List   Diagnosis Date Noted  . Palliative care by specialist   . Goals of care,  counseling/discussion   . Abdominal pain   . GI bleed 02/18/2019  . DNR (do not resuscitate) 12/21/2018  . Anemia 12/14/2018  . Liver metastasis ([place]) 07/13/2018  . Acute blood loss anemia 06/07/2018  . Rectal bleeding 05/13/2018  . Chronic diastolic CHF (congestive heart failure) ([place]) 05/13/2018  . Lower GI bleed 05/13/2018  . Class 2 obesity with body mass index (BMI) of 37.0 to 37.9 in adult 01/10/2018  . Spindle cell sarcoma ([place]) 06/07/2017  . Acute on chronic diastolic CHF (congestive heart failure) ([place]) 01/19/2017  . Chronic respiratory failure with hypoxia and hypercapnia ([place]) 10/13/2016  . OSA treated with BiPAP 10/13/2016  . Macrocytic anemia 09/24/2016  . COPD with acute exacerbation ([place]) 09/24/2016  . Acute bronchitis 02/03/2016  . Ex-smoker 10/31/2015  . Dilated cardiomyopathy ([place])   . Pulmonary hypertension ([place])   . Acute on chronic respiratory failure with hypoxia and hypercapnia ([place]) 10/20/2015  . 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 LBP (low back pain) 03/24/2011  . Malignant neoplasm of bladder ([place]) 09/22/2010  . HEMATURIA UNSPECIFIED 05/14/2010  . Osteoarthritis 03/28/2010  . Coronary atherosclerosis of native coronary artery 12/19/2007  . Acute sinusitis 12/19/2007  . COPD mixed type ([place]) 12/19/2007  . COLONIC POLYPS 12/18/2007  . HYPERCHOLESTEROLEMIA 12/18/2007  . Anxiety 12/18/2007  . Essential hypertension 12/18/2007  . GASTRITIS 12/18/2007  . Impaired fasting glucose 12/18/2007    Palliative Care Assessment & Plan   Patient Profile: 73 y.o.malewith medical history significant formetastatic spindle cell carcinoma with mets to to the bladder and liver, coronary artery disease, emphysema, COPD, OSA, hyperlipidemia, hypertensionandprior rectal bleeding. Patient presented with rectal bleeding. On presentation, Hemoglobin was 4.4g/dl and his troponins were also elevated. GI has evaluated. Plan for another unit blood today.   Recommendations/Plan:  Mr.  [name] reports that he wants to continue current therapies, work to get to this home, and follow-up with his oncologist at [facility] to discuss plan for long term care for his cancer.  He reports today that he understands that this is going to progress, and he wants to focus on spending as much time with his wife, children, and grandchildren as possible.  While he is realistic about his condition and prognosis, he feels that he needs to discuss further with oncologist prior to making further decisions.  Called and updated his wife per patient request.  He is already followed by outpatient palliative care through [facility].  Plan for further palliative care as OP through [facility].  Please let me know if there are other specific areas with which we can be of assistance with Andrew [name] this admission.   Code Status:    Code Status Orders  (From admission, onward)         Start     Ordered   02/18/19 1540  Limited resuscitation (code)  Continuous    Question Answer Comment  In the event of cardiac or respiratory ARREST: Initiate Code Blue, Call Rapid Response Yes   In the event of cardiac or respiratory ARREST: Perform CPR No   In the event of cardiac or respiratory ARREST: Perform Intubation/Mechanical Ventilation Yes   In the event of cardiac or respiratory ARREST: Use NIPPV/BiPAp only if indicated Yes   In the event of cardiac or respiratory ARREST: Administer ACLS medications if indicated Yes   In the event of cardiac or respiratory ARREST: Perform Defibrillation or Cardioversion if indicated Yes      02/18/19 1539        Code Status History    Date Active Date Inactive Code Status Order ID Comments User Context   02/18/2019 1527 02/18/2019 1539 DNR [id number]  Kayleen Memos, DO ED   02/18/2019 1519 02/18/2019 1527 Full Code [id number]  Kayleen Memos, DO ED   12/14/2018 2025 12/15/2018 2042 DNR [id number]  Elmarie Shiley, MD Inpatient   06/07/2018  2032 06/12/2018 0045 Full Code [id number]  Etta Quill, DO ED   05/13/2018 2145 05/18/2018 2157 Full Code [id number]  Toy Baker, MD Inpatient   01/19/2017 1957 01/26/2017 1550 Full Code [id number]  Rama, Venetia Maxon, MD ED   09/24/2016 0246 09/29/2016 2000 Full Code [id number]  Vianne Bulls, MD ED   10/20/2015 1934 10/29/2015 1653 Full Code [id number]  Mendel Corning, MD Inpatient    Advance Directive Documentation     Most Recent Value  Type of Advance Directive  Out of facility DNR (pink MOST or yellow form)  Pre-existing out of facility DNR order (yellow form or pink MOST form)  Yellow form placed in chart (order not valid for inpatient use)  "MOST" Form in Place?  -       Prognosis:   Unable to determine  Discharge Planning:  Home with [facility] was discussed with patient, wife  Thank you for allowing the Palliative Medicine Team to assist in the care of this patient.   Total Time 40 Prolonged Time Billed No      Greater than 50%  of this time was spent counseling and coordinating care related to the above assessment and plan.  Micheline Rough, MD  Please contact Palliative Medicine Team phone at [phone] for questions and concerns.

## 2019-02-20 NOTE — Evaluation (Signed)
Physical Therapy Evaluation Patient Details Name: [name] MRN: [id number] DOB: 1946-10-10 Today's Date: 02/20/2019   History of Present Illness  73 y.o. 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 Pt currently with functional limitations due to the deficits listed below (see PT Problem List).  Pt will benefit from skilled PT to increase their independence and safety with mobility to allow discharge to the venue listed below.  Pt sitting EOB on arrival.  Pt and RN report pt with back pain from bed so pt wished to transfer to recliner however felt too weak and fatigued to ambulate.  Pt also to receive more PRBCs today.  Pt reports he is typically on 6L O2 [place] and able to mobilize without assistive device however does have RW and w/c at home if needed.     Follow Up Recommendations Home health PT(pending progress)    Equipment Recommendations  None recommended by PT    Recommendations for Other Services       Precautions / Restrictions Precautions Precautions: Fall Precaution Comments: chronic 6L O2      Mobility  Bed Mobility               General bed mobility comments: pt sitting EOB on arrival  Transfers Overall transfer level: Needs assistance Equipment used: None Transfers: Sit to/from [facility] Sit to Stand: Min guard Stand pivot transfers: Min guard       General transfer comment: pt uses UEs to hold onto bed rail and armrests, min/guard for safety, pt reports "woozy" feeling, remained on 6L O2 [place] and SPO2 in upper 90s during session  Ambulation/Gait             General Gait Details: pt declined, too weak, also to get another unit of  blood today  Stairs            Wheelchair Mobility    Modified Rankin (Stroke Patients Only)       Balance Overall balance assessment: Needs assistance         Standing balance support: Single extremity supported Standing balance-Leahy Scale: Poor Standing balance comment: uses UE for support in standing at this time                             Pertinent Vitals/Pain Pain Assessment: No/denies pain    Home Living Family/patient expects to be discharged to:: Private residence Living Arrangements: Spouse/significant other Available Help at Discharge: Family Type of Home: House Home Access: Stairs to enter Entrance Stairs-Rails: Right;Can reach Software engineer of Steps: 3 Home Layout: One level Home Equipment: Environmental consultant - 2 wheels;Wheelchair - manual      Prior Function Level of Independence: Independent               Hand Dominance        Extremity/Trunk Assessment        Lower Extremity Assessment Lower Extremity Assessment: Generalized weakness       Communication   Communication: No difficulties  Cognition Arousal/Alertness: Awake/alert Behavior During Therapy: WFL for tasks assessed/performed Overall Cognitive Status: Within Functional Limits for tasks assessed  General Comments      Exercises     Assessment/Plan    PT Assessment Patient needs continued PT services  PT Problem List Decreased mobility;Decreased strength;Decreased activity tolerance;Decreased balance;Cardiopulmonary status limiting activity       PT Treatment Interventions DME instruction;Functional mobility training;Balance training;Patient/family education;Gait training;Therapeutic activities;Stair training;Therapeutic exercise    PT Goals (Current goals can be found in the Care Plan section)  Acute Rehab PT Goals PT Goal Formulation: With patient Time For Goal Achievement:  03/06/19 Potential to Achieve Goals: Good    Frequency Min 3X/week   Barriers to discharge        Co-evaluation               AM-PAC PT "6 Clicks" Mobility  Outcome Measure Help needed turning from your back to your side while in a flat bed without using bedrails?: A Little Help needed moving from lying on your back to sitting on the side of a flat bed without using bedrails?: A Little Help needed moving to and from a bed to a chair (including a wheelchair)?: A Little Help needed standing up from a chair using your arms (e.g., wheelchair or bedside chair)?: A Little Help needed to walk in hospital room?: A Lot Help needed climbing 3-5 steps with a railing? : A Lot 6 Click Score: 16    End of Session Equipment Utilized During Treatment: Oxygen(declined gait belt) Activity Tolerance: Patient tolerated treatment well Patient left: in chair;with call bell/phone within reach(no chair alarm pads on unit, RN aware)   PT Visit Diagnosis: Other abnormalities of gait and mobility (R26.89)    Time: 5929-2446 PT Time Calculation (min) (ACUTE ONLY): 11 min   Charges:   PT Evaluation $PT Eval Low Complexity: [place], PT, DPT Acute Rehabilitation Services Office: [phone] Pager: [phone]  Trena Platt 02/20/2019, 12:52 PM

## 2019-02-20 NOTE — Progress Notes (Signed)
[facility] Progress Note   Chief Complaint:   GI bleed    SUBJECTIVE:    feels okay. No BM / black stool since Sunday. He is shortness of breath but says no worse than usual before needing a breathing treatment   ASSESSMENT AND PLAN:   73. 73 yo male with multiple medical problems not limited to severe COPD, CAD, chronic dCHF admitted with profound anemia / melena. He has recieved total of 3 units PRBCs with less than 3 gram increase in hgb from 5.9 >>>7.2. Bleeding suspected to be related to invasion of abdominal tumor into small bowel. Of note, colonoscopy in 2018 at [facility] negative for colonic involvement.  -He is getting another unit of PRBC today and to be observed overnight. 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 If he rebleeds then will need CT angio / possible embolization.    2. Severe COPD, 02 dependent.  Breathing labored right now but he says no worse than usual when due a breathing treatment.   3. Elevated troponin, suspected to be demand ischemia related to profound anemia. No chest pain. Non-specific ST-T changes on previous EKG (prior to blood transfusion).     OBJECTIVE:     Vital signs in last 24 hours: Temp:  [96.5 F (35.8 C)-99 F (37.2 C)] 96.5 F (35.8 C) (04/07 0521) Pulse Rate:  [87-115] 87 (04/07 0521) Resp:  [18-24] 18 (04/07 0521) BP: (126-133)/(68-74) 126/74 (04/07 0521) SpO2:  [98 %-100 %] 98 % (04/07 0728) Last BM Date: 02/18/19 General:   Alert  male in NAD EENT:  Normal hearing, non icteric sclera, conjunctive pink.  Heart:  Regular rate and rhythm; no murmur.  No lower extremity edema   Pulm: Normal respiratory effort, lungs CTA bilaterally without wheezes or crackles. Abdomen:  Soft, protuberant, nontender.  Normal bowel sounds Neurologic:  Alert and  oriented x4;  grossly normal neurologically. Psych:  Pleasant, cooperative.  Normal mood and affect.   Intake/Output from previous day: 04/06 0701 - 04/07  0700 In: 1858.6 [P.O.:1140; I.V.:348.6; Blood:370] Out: 2075 [Urine:2075] Intake/Output this shift: Total I/O In: -  Out: 200 [Urine:200]  Lab Results: Recent Labs    02/18/19 1215 02/19/19 0404 02/19/19 1225 02/19/19 2000 02/20/19 0510  WBC 9.0 8.3  --   --   --   HGB 4.4* 5.9* 7.8* 7.5* 7.2*  HCT 14.5* 18.7* 23.7* 22.6* 22.4*  PLT 191 155  --   --   --    BMET Recent Labs    02/18/19 1215 02/19/19 0404  NA 135 132*  K 4.0 3.8  CL 104 102  CO2 21* 25  GLUCOSE 181* 147*  BUN 28* 23  CREATININE 0.93 0.84  CALCIUM 8.1* 7.8*   LFT Recent Labs    02/19/19 0404  PROT 6.0*  ALBUMIN 3.2*  AST 59*  ALT 25  ALKPHOS 50  BILITOT 2.3*   PT/INR Recent Labs    02/18/19 1215  LABPROT 14.2  INR 1.1   Hepatitis Panel No results for input(s): HEPBSAG, HCVAB, HEPAIGM, HEPBIGM in the last 72 hours.  Dg Chest Port 1 View  Result Date: 02/19/2019 CLINICAL DATA:  Shortness of breath, heavy breathing EXAM: PORTABLE CHEST 1 VIEW COMPARISON:  02/18/2019 FINDINGS: The lungs are hyperinflated likely secondary to COPD. There is no focal parenchymal opacity. There is no pleural effusion or pneumothorax. The heart and mediastinal contours are unremarkable. The osseous structures are unremarkable. IMPRESSION: No active disease. Electronically  Signed   By: Kathreen Devoid   On: 02/19/2019 13:18   Dg Chest Port 1 View  Result Date: 02/18/2019 CLINICAL DATA:  Shortness of breath and abdominal pain. History of malignancy. EXAM: PORTABLE CHEST 1 VIEW COMPARISON:  01/22/2017; 01/19/2017; 09/26/2016; chest CT-02/17/2017 FINDINGS: Grossly unchanged cardiac silhouette and mediastinal contours with atherosclerotic plaque when thoracic aorta. The lungs remain hyperexpanded with thinning of the biapical pulmonary parenchyma and bibasilar linear heterogeneous opacities, left greater than right. No pleural effusion or pneumothorax. No evidence of edema. Apparent development of bilateral nodular opacities  with dominant nodular opacity overlying the peripheral aspect right upper lung measuring 1.6 cm and dominant nodular opacity overlying the left mid lung measuring 1.1 cm. IMPRESSION: 1. Interval apparent development of indeterminate bilateral nodular opacities, which given provided history of malignancy could represent metastasis. Further evaluation with chest CT could be performed as indicated. 2. Otherwise, similar findings of marked lung hyperexpansion without superimposed acute cardiopulmonary disease. Electronically Signed   By: Sandi Mariscal M.D.   On: 02/18/2019 12:25      Active Problems:   GI bleed   Palliative care by specialist   Goals of care, counseling/discussion   Abdominal pain     LOS: 2 days   Tye Savoy ,NP 02/20/2019, 11:18 AM

## 2019-02-21 DIAGNOSIS — D500 Iron deficiency anemia secondary to blood loss (chronic): Secondary | ICD-10-CM

## 2019-02-21 DIAGNOSIS — Z515 Encounter for palliative care: Secondary | ICD-10-CM

## 2019-02-21 DIAGNOSIS — R0602 Shortness of breath: Secondary | ICD-10-CM

## 2019-02-21 DIAGNOSIS — Z7189 Other specified counseling: Secondary | ICD-10-CM

## 2019-02-21 DIAGNOSIS — D649 Anemia, unspecified: Secondary | ICD-10-CM

## 2019-02-21 DIAGNOSIS — R7989 Other specified abnormal findings of blood chemistry: Secondary | ICD-10-CM

## 2019-02-21 DIAGNOSIS — R778 Other specified abnormalities of plasma proteins: Secondary | ICD-10-CM

## 2019-02-21 LAB — TYPE AND SCREEN
ABO/RH(D): A POS
Antibody Screen: NEGATIVE
Unit division: 0
Unit division: 0
Unit division: 0
Unit division: 0
Unit division: 0

## 2019-02-21 LAB — BPAM RBC
Blood Product Expiration Date: 202004152359
Blood Product Expiration Date: 202004162359
Blood Product Expiration Date: 202004162359
Blood Product Expiration Date: 202004162359
Blood Product Expiration Date: 202004162359
ISSUE DATE / TIME: 202004051609
ISSUE DATE / TIME: 202004052155
ISSUE DATE / TIME: 202004060612
ISSUE DATE / TIME: 202004071539
ISSUE DATE / TIME: 202004071926
Unit Type and Rh: 6200
Unit Type and Rh: 6200
Unit Type and Rh: 6200
Unit Type and Rh: 6200
Unit Type and Rh: 6200

## 2019-02-21 LAB — GLUCOSE, CAPILLARY
Glucose-Capillary: 132 mg/dL — ABNORMAL HIGH (ref 70–99)
Glucose-Capillary: 133 mg/dL — ABNORMAL HIGH (ref 70–99)

## 2019-02-21 LAB — HEMOGLOBIN AND HEMATOCRIT, BLOOD
HCT: 28 % — ABNORMAL LOW (ref 39.0–52.0)
HCT: 29.5 % — ABNORMAL LOW (ref 39.0–52.0)
Hemoglobin: 9.3 g/dL — ABNORMAL LOW (ref 13.0–17.0)
Hemoglobin: 9.7 g/dL — ABNORMAL LOW (ref 13.0–17.0)

## 2019-02-21 MED ORDER — BUDESONIDE 0.25 MG/2ML IN SUSP
0.2500 mg | Freq: Two times a day (BID) | RESPIRATORY_TRACT | Status: DC
  Administered 2019-02-21: 0.25 mg via RESPIRATORY_TRACT
  Filled 2019-02-21: qty 2

## 2019-02-21 NOTE — Progress Notes (Signed)
[facility] Progress Note   Chief Complaint:   GI bleed   SUBJECTIVE:    feels okay, going home today. He had a black stool this am but only second BM in last 3 days and says it is still old blood.    ASSESSMENT AND PLAN:   1. 73 yo malewith multiple medical problems not limited to severe COPD, CAD, chronic dCHF admitted with profound anemia / melena.He recieved 2 more [facility] yesterday for a total of 5. Hgb stable overnight 9.3 >>> 9.7.  Bleeding possibly from invasion of abdominal tumor into small bowel. Of note, colonoscopy in 2018 at [facility] negative for colonic involvement.  -Patient for discharge today. I advised him to follow up with PCP or at least get labs if black stool persists. He does have an iron pill at home but says it doesn't usually turn stools black as to make it difficult to tell if bleeding.  -He will be following up soon with Oncologist at [facility]  2.  Elevated troponin, suspected to be demand ischemia related to profound anemia. Troponin 3.4 today, down from 5.53 yesterday. No chest pain.    OBJECTIVE:     Vital signs in last 24 hours: Temp:  [98.7 F (37.1 C)-99.8 F (37.7 C)] 98.7 F (37.1 C) (04/08 0430) Pulse Rate:  [83-103] 83 (04/08 0430) Resp:  [15-20] 15 (04/08 0430) BP: (118-148)/(63-90) 121/76 (04/08 0430) SpO2:  [97 %-100 %] 98 % (04/08 0724) Last BM Date: 02/20/19 General:   Alert, well-developed male in NAD EENT:  Normal hearing, non icteric sclera, conjunctive pink.  Heart:  Regular rate and rhythm; no murmur.  No lower extremity edema.   Pulm: Normal respiratory effort Abdomen:  Soft, protuberant, nontender.  Normal bowel sounds Neurologic:  Alert and  oriented x4;  grossly normal neurologically. Psych:  Pleasant, cooperative.  Normal mood and affect.   Intake/Output from previous day: 04/07 0701 - 04/08 0700 In: 1210 [P.O.:1200; I.V.:10] Out: 1725 [Urine:1725] Intake/Output this shift: Total I/O In: 480 [P.O.:480]  Out: -   Lab Results: Recent Labs    02/18/19 1215 02/19/19 0404  02/20/19 0510 02/21/19 0404 02/21/19 1119  WBC 9.0 8.3  --   --   --   --   HGB 4.4* 5.9*   < > 7.2* 9.3* 9.7*  HCT 14.5* 18.7*   < > 22.4* 28.0* 29.5*  PLT 191 155  --   --   --   --    < > = values in this interval not displayed.   BMET Recent Labs    02/18/19 1215 02/19/19 0404  NA 135 132*  K 4.0 3.8  CL 104 102  CO2 21* 25  GLUCOSE 181* 147*  BUN 28* 23  CREATININE 0.93 0.84  CALCIUM 8.1* 7.8*   LFT Recent Labs    02/19/19 0404  PROT 6.0*  ALBUMIN 3.2*  AST 59*  ALT 25  ALKPHOS 50  BILITOT 2.3*   PT/INR Recent Labs    02/18/19 1215  LABPROT 14.2  INR 1.1   Hepatitis Panel No results for input(s): HEPBSAG, HCVAB, HEPAIGM, HEPBIGM in the last 72 hours.  Dg Chest Port 1 View  Result Date: 02/19/2019 CLINICAL DATA:  Shortness of breath, heavy breathing EXAM: PORTABLE CHEST 1 VIEW COMPARISON:  02/18/2019 FINDINGS: The lungs are hyperinflated likely secondary to COPD. There is no focal parenchymal opacity. There is no pleural effusion or pneumothorax. The heart and mediastinal contours are unremarkable. The osseous structures  are unremarkable. IMPRESSION: No active disease. Electronically Signed   By: Kathreen Devoid   On: 02/19/2019 13:18     Active Problems:   GI bleed   Palliative care by specialist   Goals of care, counseling/discussion   Abdominal pain     LOS: 3 days   Tye Savoy ,NP 02/21/2019, 11:34 AM

## 2019-02-21 NOTE — Progress Notes (Signed)
Received consult to discontinue midline. Spoke with nurse covering, pull midline according to PIV procedure. No IV Team consult needed. He will make pt's RN aware.

## 2019-02-21 NOTE — Care Management Important Message (Signed)
Important Message  Patient Details IM Letter given to the Case Manager to present to the Patient Name: [name] MRN: [id number] Date of Birth: [birth year]   Medicare Important Message Given:  Yes    [name] 02/21/2019, 1:18 [place] Message  Patient Details  Name: [name] MRN: [id number] Date of Birth: 1946/11/13   Medicare Important Message Given:  Yes    [name] 02/21/2019, 1:17 PM

## 2019-02-21 NOTE — Consult Note (Signed)
   [facility] Inpatient Consult   02/21/2019  NASON CONRADT [birth year] [id number]   Patient chart reviewed due to unplanned readmission risk score of 25%, high, under [facility] ACO registry Medicare insurance plan.   Per chart review, Mr. Strege is followed by outpatient palliative care. No [facility] needs.  Netta Cedars, MSN, [facility] Liaison Nurse Mobile Phone [phone]  Toll free office [phone]

## 2019-02-21 NOTE — Discharge Summary (Signed)
Physician Discharge Summary  Patient ID: [name] MRN: [id number] DOB/AGE: 74-May-1947 74 y.o.  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  Patient presented with rectal bleeding.  On presentation, Hemoglobin was 4.4g/dl and troponin was 1.47. Troponin peaked at 5.53, and thought to be related to the anemia.  Patient was transfused with a total of 5 units of packed red blood cells.  Hemoglobin prior to discharge was 9.7 g/dL, and a troponin was 3.40.  Patient has remained stable.  Palliative care team and GI team were consulted to assist with patient's management.  Patient is back to his baseline and be discharged to the care of the primary care provider and oncology team.  Patient will follow with cardiology team on discharge.   Rectal bleed with prior history of GI bleed: Presented with 3 days' history of intermittent bright red blood per rectum Follows with GI at [facility] Hemoglobin was 4.4 on presentation, with positive FOBT.   Baseline hemoglobin 8 grams per deciliter Patient was transfused with a total of 5 units of packed red blood cells. Hemoglobin prior to discharge was 9.7 g/dL.  Elevated troponin, likely related to blood loss: Presented with troponin of 1.47 and hemoglobin of 4.4. Troponin 5.53.   Troponin prior to discharge was 3.4. Patient will follow up with cardiology on discharge.  Acute blood  loss anemia:  Kindly see above.  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  Chronic diastolic CHF Last 2D echo done on 01/20/2017 revealed LVEF 55 to 60% with no regional wall abnormalities Stable.  OSA C/w BIPAP at night  ImpairedFasting glucose Hemoglibin A1c was 5.7% Heart healthy carb modified diet    Consults: GI and Palliative care consult  Significant Diagnostic Studies:  Hemoglobin on presentation was 4.4, 9.7 g/dL on discharge. Troponin on presentation was 1.47, peaked at 5.53, and down to 3.40 prior to discharge.  Treatments: Patient was transfused with a total of 5 units of packed red blood cells.  Discharge Exam: Blood pressure 121/76, pulse 83, temperature 98.7 F (37.1 C), temperature source Oral, resp. rate 15, weight 97.5 kg, SpO2 98 %.  Disposition: Discharge disposition: 01-Home or Self Care   Discharge Instructions    Diet - low sodium heart healthy   Complete by:  As directed    Increase activity slowly   Complete by:  As directed      Allergies as of 02/21/2019      Reactions   Amlodipine Swelling      Medication List    STOP taking these medications   guaiFENesin 600 MG 12 hr tablet Commonly known as:  MUCINEX   isosorbide mononitrate 30 MG 24 hr tablet Commonly known as:  IMDUR   lisinopril 10 MG tablet Commonly known as:  PRINIVIL,ZESTRIL     TAKE these medications   acetaminophen 325 MG tablet Commonly known as:  TYLENOL Take 2 tablets (650 mg total) by mouth  every 6 (six) hours as needed for mild pain (or Fever >/= 101).   Albuterol Sulfate 108 (90 Base) MCG/ACT Aepb Commonly known as:  ProAir RespiClick Inhale 2 puffs into the lungs every 6 (six) hours as needed (shortness of breath).   ALPRAZolam 0.5 MG tablet Commonly known as:  XANAX Take 1 tablet (0.5 mg total) by mouth at bedtime.   budesonide 0.25 MG/2ML nebulizer solution Commonly known as:  PULMICORT Take 2  mLs (0.25 mg total) by nebulization 2 (two) times daily.   cholecalciferol 10 MCG (400 UNIT) Tabs tablet Commonly known as:  VITAMIN D3 Take 400 Units by mouth at bedtime.   FLAX SEEDS PO Take 1 capsule by mouth daily.   fluticasone 50 MCG/ACT nasal spray Commonly known as:  FLONASE Place 2 sprays into both nostrils daily as needed for allergies or rhinitis.   furosemide 40 MG tablet Commonly known as:  LASIX Take 40 mg by mouth daily as needed for fluid.   ipratropium-albuterol 0.5-2.5 (3) MG/3ML Soln Commonly known as:  DUONEB Take 3 mLs by nebulization every 4 (four) hours as needed. What changed:  when to take this   Iron 325 (65 Fe) MG Tabs Take 325 mg by mouth daily.   methocarbamol 500 MG tablet Commonly known as:  ROBAXIN Take 1,000 mg by mouth at bedtime as needed for muscle spasms.   multivitamin-lutein Caps capsule Take 1 capsule by mouth every evening.   nitroGLYCERIN 0.4 MG SL tablet Commonly known as:  NITROSTAT Place 1 tablet (0.4 mg total) under the tongue every 5 (five) minutes as needed for chest pain.   pantoprazole 40 MG tablet Commonly known as:  Protonix Take 1 tablet (40 mg total) by mouth daily.   simvastatin 20 MG tablet Commonly known as:  ZOCOR Take 1 tablet (20 mg total) by mouth daily at 6 PM.   sodium chloride 0.65 % Soln nasal spray Commonly known as:  OCEAN Place 1 spray into both nostrils as needed for congestion. What changed:  when to take this   SYSTANE OP Apply 1 drop to eye daily as needed (dry eyes).   vitamin B-12 1000 MCG tablet Commonly known as:  CYANOCOBALAMIN Take 1,000 mcg by mouth daily.   vitamin C 1000 MG tablet Take 1,000 mg by mouth daily.   Votrient 200 MG tablet Generic drug:  pazopanib        Signed: Bonnell Public 02/21/2019, 11:36 AM

## 2019-02-23 LAB — CULTURE, BLOOD (ROUTINE X 2)
Culture: NO GROWTH
Culture: NO GROWTH
Special Requests: ADEQUATE
Special Requests: ADEQUATE

## 2019-02-25 DIAGNOSIS — Z9181 History of falling: Secondary | ICD-10-CM | POA: Diagnosis not present

## 2019-02-25 DIAGNOSIS — E785 Hyperlipidemia, unspecified: Secondary | ICD-10-CM | POA: Diagnosis not present

## 2019-02-25 DIAGNOSIS — I272 Pulmonary hypertension, unspecified: Secondary | ICD-10-CM | POA: Diagnosis not present

## 2019-02-25 DIAGNOSIS — F419 Anxiety disorder, unspecified: Secondary | ICD-10-CM | POA: Diagnosis not present

## 2019-02-25 DIAGNOSIS — J449 Chronic obstructive pulmonary disease, unspecified: Secondary | ICD-10-CM | POA: Diagnosis not present

## 2019-02-25 DIAGNOSIS — Z79891 Long term (current) use of opiate analgesic: Secondary | ICD-10-CM | POA: Diagnosis not present

## 2019-02-25 DIAGNOSIS — C499 Malignant neoplasm of connective and soft tissue, unspecified: Secondary | ICD-10-CM | POA: Diagnosis not present

## 2019-02-25 DIAGNOSIS — I10 Essential (primary) hypertension: Secondary | ICD-10-CM | POA: Diagnosis not present

## 2019-02-25 DIAGNOSIS — Z8551 Personal history of malignant neoplasm of bladder: Secondary | ICD-10-CM | POA: Diagnosis not present

## 2019-02-25 DIAGNOSIS — D649 Anemia, unspecified: Secondary | ICD-10-CM | POA: Diagnosis not present

## 2019-02-27 ENCOUNTER — Other Ambulatory Visit: Payer: Medicare Other | Admitting: *Deleted

## 2019-02-27 ENCOUNTER — Other Ambulatory Visit: Payer: Self-pay

## 2019-02-27 ENCOUNTER — Other Ambulatory Visit: Payer: Medicare Other | Admitting: Licensed Clinical Social Worker

## 2019-02-27 DIAGNOSIS — I509 Heart failure, unspecified: Secondary | ICD-10-CM | POA: Diagnosis not present

## 2019-02-27 DIAGNOSIS — Z515 Encounter for palliative care: Secondary | ICD-10-CM

## 2019-02-27 DIAGNOSIS — R252 Cramp and spasm: Secondary | ICD-10-CM | POA: Diagnosis not present

## 2019-02-27 DIAGNOSIS — C3490 Malignant neoplasm of unspecified part of unspecified bronchus or lung: Secondary | ICD-10-CM | POA: Diagnosis not present

## 2019-02-27 DIAGNOSIS — C499 Malignant neoplasm of connective and soft tissue, unspecified: Secondary | ICD-10-CM | POA: Diagnosis not present

## 2019-02-27 DIAGNOSIS — G4733 Obstructive sleep apnea (adult) (pediatric): Secondary | ICD-10-CM | POA: Diagnosis not present

## 2019-02-27 DIAGNOSIS — G4700 Insomnia, unspecified: Secondary | ICD-10-CM | POA: Diagnosis not present

## 2019-02-27 DIAGNOSIS — D62 Acute posthemorrhagic anemia: Secondary | ICD-10-CM | POA: Diagnosis not present

## 2019-02-27 DIAGNOSIS — I10 Essential (primary) hypertension: Secondary | ICD-10-CM | POA: Diagnosis not present

## 2019-02-27 DIAGNOSIS — R000 Tachycardia, unspecified: Secondary | ICD-10-CM | POA: Diagnosis not present

## 2019-02-27 DIAGNOSIS — I21A9 Other myocardial infarction type: Secondary | ICD-10-CM | POA: Diagnosis not present

## 2019-02-27 DIAGNOSIS — J449 Chronic obstructive pulmonary disease, unspecified: Secondary | ICD-10-CM | POA: Diagnosis not present

## 2019-02-27 NOTE — Progress Notes (Signed)
COMMUNITY PALLIATIVE CARE SW NOTE  PATIENT NAME: [name] DOB: July 10, 1946 MRN: [id number]  PRIMARY CARE PROVIDER: [name], MD  RESPONSIBLE PARTY:  Acct ID - Guarantor Home Phone Work Phone Relationship Acct Type  [id number] [name]  Self P/F     [place], [place], [place] [address]   Due to the COVID-19 crisis, this virtual check-in visit was done via telephone from my office and it was initiated and consent given by this patientand orfamily.  PLAN OF CARE and INTERVENTIONS:             1. GOALS OF CARE/ ADVANCE CARE PLANNING:  Patient wishes to remain at home with his wife, [name], as long as possible.  He is a full code. 2. SOCIAL/EMOTIONAL/SPIRITUAL ASSESSMENT/ INTERVENTIONS:  SW and Palliative Care RN, [name], conducted a joint phone call.  Discussed patient's recent hospitalization.  He reports his medications have been adjusted to his liking.  He requested a home visit from the RN due to the recent hospitalization and concerns regarding his blood pressure.  Visit scheduled for later today.  No other issues at this time per patient. 3. PATIENT/CAREGIVER EDUCATION/ COPING:  SW provided education regarding virtual check-in visits.  He stated he understood and only wanted calls on his home phone.  He expresses his feelings openly. 4. PERSONAL EMERGENCY PLAN:  Patient will rest when he becomes fatigued.  He will also inform his wife or daughter when medical attention is needed. 5. COMMUNITY RESOURCES COORDINATION/ HEALTH CARE NAVIGATION:  N 6. FINANCIAL/LEGAL CONCERNS/INTERVENTIONS:  N     SOCIAL HX:  Social History   Tobacco Use  . Smoking status: Former Smoker    Packs/day: 2.00    Years: 45.00    Pack years: 90.00    Types: Cigarettes    Last attempt to quit: 10/17/2015    Years since quitting: 3.3  . Smokeless tobacco: Never Used  Substance Use Topics  . Alcohol use: Yes    Alcohol/week: 0.0 standard drinks    Comment: glass of  wine at night    CODE STATUS:  Full Code ADVANCED DIRECTIVES: N MOST FORM COMPLETE:  N HOSPICE EDUCATION PROVIDED: N PPS:  Patient's appetite is normal.  He ambulates independently. Duration of visit and documentation:  30 minutes.      Creola Corn Azya Barbero, LCSW

## 2019-03-01 DIAGNOSIS — K922 Gastrointestinal hemorrhage, unspecified: Secondary | ICD-10-CM | POA: Diagnosis not present

## 2019-03-01 NOTE — Progress Notes (Signed)
COMMUNITY PALLIATIVE CARE RN NOTE  PATIENT NAME: [name] DOB: May 21, 1946 MRN: [id number]  PRIMARY CARE PROVIDER: [name], MD  RESPONSIBLE PARTY:  Acct ID - Guarantor Home Phone Work Phone Relationship Acct Type  [id number] [name]  Self P/F     [place], [place], [place] [address]   COVID-19 Prescreen negative  PLAN OF CARE and INTERVENTION:  1. ADVANCE CARE PLANNING/GOALS OF CARE: Patient wants to remain at home and avoid hospitalizations. 2. PATIENT/CAREGIVER EDUCATION: Reinforced Safe Mobility, Energy Conservation and Breathing Techniques 3. DISEASE STATUS: Initially spoke with patient and wife via telephone for virtual visit along with MSW, Jasmine Awe. Patient expressed health concerns and requested an in-person visit. RN visit made. Met with patient and wife in their home. Patient requesting RN visit to assess BP and follow up s/p recent hospitalization. Patient reports that he started to become very weak and hardly able to get out of bed. Also states that his HR was elevated, ranging 140-150 bpm, along with increased dyspnea. Wife called EMS. Patient was admitted to [facility] from 02/18/19 to 02/21/19. Hgb found to be 4.4 from GI bleed. He received 5 Units of PRBCs and was taken off of his BP medications. His discharge Hgb was 9.7. He is to follow up with his Cardiologist, as his Troponin level was also elevated. Mucinex, Imdur and Lisinopril were discontinued. Patient reports that he is getting stronger each day since being at home. Continued dyspnea with exertion, but slowly improving back to his baseline. He is on O2 @ 6L/min via [place] at all times at the moment. He is taking Duonebs 4x/day and Budesonide BID. He is grayish/pale in appearance. He states he does not have an appetite, but is eating because he feels he should. Intake slowly increasing. He remains able to perform ADLs independently, just at a slower pace. He is ambulatory without  assistive devices. He is continent of both bowel and bladder. Abdomen distended, but BS present x 4. He did say that he recently received a 14 day supply of a chemo medication d/t his metastatic spindle cell carcinoma with mets to his bladder and liver. He says that he is not going to start this medication until he finds out whether or not he will qualify for a trial. He also requested that his Physical Therapy be placed on hold, until his preferred Physical Therapist returns to work. Will continue to monitor.  HISTORY OF PRESENT ILLNESS:  This is a 73 yo male who resides at home with his wife. Palliative Care Team continues to follow patient. Will continue to check in monthly and PRN.  CODE STATUS: Full Code ADVANCED DIRECTIVES: N MOST FORM: no PPS: 50%   PHYSICAL EXAM:   VITALS: BP 133/71, P 87, T 97.9, R 20, O2 99% 6L/min via [place] LUNGS: decreased breath sounds CARDIAC: Cor RRR EXTREMITIES: No edema SKIN: Grayish/pale appearing skin; exposed skin is dry and intact  NEURO: Alert and oriented x 3, increased generalized weakness, ambulatory   (Duration of visit and documentation 90 minutes)    [name], RN BSN

## 2019-03-02 DIAGNOSIS — I272 Pulmonary hypertension, unspecified: Secondary | ICD-10-CM | POA: Diagnosis not present

## 2019-03-02 DIAGNOSIS — J449 Chronic obstructive pulmonary disease, unspecified: Secondary | ICD-10-CM | POA: Diagnosis not present

## 2019-03-02 DIAGNOSIS — F419 Anxiety disorder, unspecified: Secondary | ICD-10-CM | POA: Diagnosis not present

## 2019-03-02 DIAGNOSIS — D649 Anemia, unspecified: Secondary | ICD-10-CM | POA: Diagnosis not present

## 2019-03-02 DIAGNOSIS — I10 Essential (primary) hypertension: Secondary | ICD-10-CM | POA: Diagnosis not present

## 2019-03-02 DIAGNOSIS — C499 Malignant neoplasm of connective and soft tissue, unspecified: Secondary | ICD-10-CM | POA: Diagnosis not present

## 2019-03-03 DIAGNOSIS — I272 Pulmonary hypertension, unspecified: Secondary | ICD-10-CM | POA: Diagnosis not present

## 2019-03-03 DIAGNOSIS — C499 Malignant neoplasm of connective and soft tissue, unspecified: Secondary | ICD-10-CM | POA: Diagnosis not present

## 2019-03-03 DIAGNOSIS — I10 Essential (primary) hypertension: Secondary | ICD-10-CM | POA: Diagnosis not present

## 2019-03-03 DIAGNOSIS — F419 Anxiety disorder, unspecified: Secondary | ICD-10-CM | POA: Diagnosis not present

## 2019-03-03 DIAGNOSIS — D649 Anemia, unspecified: Secondary | ICD-10-CM | POA: Diagnosis not present

## 2019-03-03 DIAGNOSIS — J449 Chronic obstructive pulmonary disease, unspecified: Secondary | ICD-10-CM | POA: Diagnosis not present

## 2019-03-05 ENCOUNTER — Other Ambulatory Visit: Payer: Self-pay

## 2019-03-05 ENCOUNTER — Other Ambulatory Visit: Payer: Medicare Other | Admitting: *Deleted

## 2019-03-05 DIAGNOSIS — Z515 Encounter for palliative care: Secondary | ICD-10-CM

## 2019-03-08 ENCOUNTER — Ambulatory Visit: Payer: Medicare Other | Admitting: Pulmonary Disease

## 2019-03-08 DIAGNOSIS — K922 Gastrointestinal hemorrhage, unspecified: Secondary | ICD-10-CM | POA: Diagnosis not present

## 2019-03-08 DIAGNOSIS — R740 Nonspecific elevation of levels of transaminase and lactic acid dehydrogenase [LDH]: Secondary | ICD-10-CM | POA: Diagnosis not present

## 2019-03-09 DIAGNOSIS — J449 Chronic obstructive pulmonary disease, unspecified: Secondary | ICD-10-CM | POA: Diagnosis not present

## 2019-03-09 DIAGNOSIS — I10 Essential (primary) hypertension: Secondary | ICD-10-CM | POA: Diagnosis not present

## 2019-03-09 DIAGNOSIS — C499 Malignant neoplasm of connective and soft tissue, unspecified: Secondary | ICD-10-CM | POA: Diagnosis not present

## 2019-03-09 DIAGNOSIS — I272 Pulmonary hypertension, unspecified: Secondary | ICD-10-CM | POA: Diagnosis not present

## 2019-03-09 DIAGNOSIS — D649 Anemia, unspecified: Secondary | ICD-10-CM | POA: Diagnosis not present

## 2019-03-09 DIAGNOSIS — F419 Anxiety disorder, unspecified: Secondary | ICD-10-CM | POA: Diagnosis not present

## 2019-03-11 DIAGNOSIS — C499 Malignant neoplasm of connective and soft tissue, unspecified: Secondary | ICD-10-CM | POA: Diagnosis not present

## 2019-03-11 DIAGNOSIS — F419 Anxiety disorder, unspecified: Secondary | ICD-10-CM | POA: Diagnosis not present

## 2019-03-11 DIAGNOSIS — J449 Chronic obstructive pulmonary disease, unspecified: Secondary | ICD-10-CM | POA: Diagnosis not present

## 2019-03-11 DIAGNOSIS — I272 Pulmonary hypertension, unspecified: Secondary | ICD-10-CM | POA: Diagnosis not present

## 2019-03-11 DIAGNOSIS — I10 Essential (primary) hypertension: Secondary | ICD-10-CM | POA: Diagnosis not present

## 2019-03-11 DIAGNOSIS — D649 Anemia, unspecified: Secondary | ICD-10-CM | POA: Diagnosis not present

## 2019-03-13 NOTE — Progress Notes (Signed)
COMMUNITY PALLIATIVE CARE RN NOTE  PATIENT NAME: [name] DOB: 09/07/1946 MRN: [id number]  PRIMARY CARE PROVIDER: [name], MD  RESPONSIBLE PARTY:  Acct ID - Guarantor Home Phone Work Phone Relationship Acct Type  [id number] [name]  Self P/F     [place], [place], [place] [address]   COVID-19 Pre-screening negative  PLAN OF CARE and INTERVENTION:  1. ADVANCE CARE PLANNING/GOALS OF CARE: Goal is for patient to remain at home with his wife.  2. PATIENT/CAREGIVER EDUCATION: Reinforced Safe Mobility, Breathing Techniques and Energy Conservation 3. DISEASE STATUS: Met with patient and his wife in their home. Patient denies pain, but is experiencing some dyspnea after walking less than 55f from his computer chair to his couch. He remains on 6L/min via [place]. He continues with dyspnea on exertion, but this resolves within about 60 - 90 seconds. He is using his Duonebs TID along with Budesonide BID. He wears a c-pap during the night. He reports not sleeping well last pm, unsure of cause. He was able to work with his Physical Therapist this past weekend. He has had a few occasions since last week where his HR has been in the 100s-110s. He remains able to perform ADLs independently, but requires frequent rest periods. He says that he does not have much of an appetite, but does try and eat something at all 3 meals. He reports that he had his Hgb checked last week and it dropped from 9.7 (reading after receiving 5 units of PRBCs while in the hospital from 02/18/19 - 02/21/19) to 9.0.  He says he is going to call his PCP to see when he can get Hgb re-checked. He denies noticing any blood in his stool. His abdomen remains firm and distended. He denies abdominal discomfort. He states that he has been approved for a chemotherapy drug, but has not started as of yet. He is waiting on his Oncologist to give him the ok to proceed with taking this medication. Will continue to  monitor.  HISTORY OF PRESENT ILLNESS:  This is a 73yo male who resides at home with his wife. Palliative Care Team continues to follow patient. Will continue to check in with patient monthly and PRN.  CODE STATUS: Full Code ADVANCED DIRECTIVES: N MOST FORM: no PPS: 50%   PHYSICAL EXAM:   VITALS: Today's Vitals   03/05/19 1615  BP: 136/89  Pulse: 97  Resp: (!) 24  SpO2: 97%  PainSc: 0-No pain    LUNGS: decreased breath sounds CARDIAC: Cor RRR EXTREMITIES: No edema SKIN: Exposed skin is dry and intact  NEURO: Alert and oriented x 3, generalized weakness, ambulatory   (Duration of visit and documentation 45 minutes)    MDaryl Eastern RN BSN

## 2019-03-14 ENCOUNTER — Other Ambulatory Visit: Payer: Self-pay

## 2019-03-14 NOTE — Patient Outreach (Signed)
Medicare tier 5 outreach:  Placed call to patient, who was a previous patient of mine.  Reviewed reason for call.  Patient reports that he will see pulmonary tomorrow. Reports he is active with PT and palliative care.  Reports no needs at this time but would like my contact information again if things changed.   PLAN: will mail successful outreach letter and encouraged patient to call me if needed.   No needs identified on todays call.  Tomasa Rand, RN, BSN, CEN [facility] [employer] [phone]

## 2019-03-15 ENCOUNTER — Ambulatory Visit (INDEPENDENT_AMBULATORY_CARE_PROVIDER_SITE_OTHER): Payer: Medicare Other | Admitting: Adult Health

## 2019-03-15 ENCOUNTER — Encounter: Payer: Self-pay | Admitting: Adult Health

## 2019-03-15 ENCOUNTER — Other Ambulatory Visit: Payer: Self-pay

## 2019-03-15 DIAGNOSIS — J9611 Chronic respiratory failure with hypoxia: Secondary | ICD-10-CM

## 2019-03-15 DIAGNOSIS — J449 Chronic obstructive pulmonary disease, unspecified: Secondary | ICD-10-CM | POA: Diagnosis not present

## 2019-03-15 DIAGNOSIS — D62 Acute posthemorrhagic anemia: Secondary | ICD-10-CM | POA: Diagnosis not present

## 2019-03-15 NOTE — Patient Instructions (Signed)
Continue Budesonide Neb Twice daily   Continue on Duoneb Four times a day  .  Continue on Oxygen 4l/m rest and 6 l/m with activity .  Continue on BIPAP At bedtime  With oxygen .  Follow in 2 months with Dr . Valeta Harms or Parrett NP and As needed    Please contact office for sooner follow up if symptoms do not improve or worsen or seek emergency care

## 2019-03-15 NOTE — Progress Notes (Signed)
Virtual Visit via Telephone Note  I connected with [name] on 03/15/19 at  2:30 PM EDT by telephone and verified that I am speaking with the correct person using two identifiers.  Location: Patient: Home   Provider: Office    I discussed the limitations, risks, security and privacy concerns of performing an evaluation and management service by telephone and the availability of in person appointments. I also discussed with the patient that there may be a patient responsible charge related to this service. The patient expressed understanding and agreed to proceed.   History of Present Illness: 73 year old male with COPD GOLD III , chronic diastolic heart failure, chronic hypoxic respiratory failure on home oxygen.  He is on oxygen 4 L at baseline and 6 L with activity.  He uses BiPAP at night. 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  He is followed by [facility] oncology.  Underwent a TACE procedure for liver metastasis Patient has hypertension and coronary artery disease with previous stents.    Today tele-visit as a follow-up for COPD and oxygen dependent respiratory failure. Last visit patient was changed from [place] and [place] (due to inolerance)  to DuoNeb 4 times a day and continued on budesonide nebulizer twice daily.  Since last visit patient is feeling   Patient remains on oxygen 4 L at rest and 6 L with activity.  He is on BiPAP at bedtime with oxygen.  Since last visit patient was admitted earlier this month for a GI bleed. Had  Bloody stools .  Hemoglobin on admission was 4.4.  Patient did require 5 units of packed red blood cells.  At discharge hemoglobin was 9.7.  He is followed by palliative care at home.Marland Kitchengot labs checked at home . Hgb reported at 8 today .    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  Continue on Oxygen 4l/m rest and 6 l/m with activity .  Continue on BIPAP At bedtime  With oxygen .  Follow in 2 months with Dr . Valeta Harms or Joseantonio Dittmar NP and As needed    Please contact office for sooner follow up if symptoms do not improve or worsen or seek emergency care       Follow Up Instructions: Follow up in 2 months and As needed      I discussed the assessment and treatment plan with the patient. The patient was provided an opportunity to ask questions and all were answered. The patient agreed with the plan and demonstrated an understanding of the instructions.   The patient was advised to call back or seek an in-person evaluation if the symptoms worsen or if the condition fails to improve as anticipated.  I provided 24 minutes of non-face-to-face time during this encounter.   Rexene Edison, NP

## 2019-03-16 NOTE — Progress Notes (Signed)
PCCM: Agree. Thanks for calling.  Garner Nash, DO [facility] 03/16/2019 9:00 AM

## 2019-03-17 DIAGNOSIS — I272 Pulmonary hypertension, unspecified: Secondary | ICD-10-CM | POA: Diagnosis not present

## 2019-03-17 DIAGNOSIS — D649 Anemia, unspecified: Secondary | ICD-10-CM | POA: Diagnosis not present

## 2019-03-17 DIAGNOSIS — C499 Malignant neoplasm of connective and soft tissue, unspecified: Secondary | ICD-10-CM | POA: Diagnosis not present

## 2019-03-17 DIAGNOSIS — J449 Chronic obstructive pulmonary disease, unspecified: Secondary | ICD-10-CM | POA: Diagnosis not present

## 2019-03-17 DIAGNOSIS — F419 Anxiety disorder, unspecified: Secondary | ICD-10-CM | POA: Diagnosis not present

## 2019-03-17 DIAGNOSIS — I10 Essential (primary) hypertension: Secondary | ICD-10-CM | POA: Diagnosis not present

## 2019-03-18 DIAGNOSIS — J449 Chronic obstructive pulmonary disease, unspecified: Secondary | ICD-10-CM | POA: Diagnosis not present

## 2019-03-18 DIAGNOSIS — I272 Pulmonary hypertension, unspecified: Secondary | ICD-10-CM | POA: Diagnosis not present

## 2019-03-18 DIAGNOSIS — D649 Anemia, unspecified: Secondary | ICD-10-CM | POA: Diagnosis not present

## 2019-03-18 DIAGNOSIS — I10 Essential (primary) hypertension: Secondary | ICD-10-CM | POA: Diagnosis not present

## 2019-03-18 DIAGNOSIS — C499 Malignant neoplasm of connective and soft tissue, unspecified: Secondary | ICD-10-CM | POA: Diagnosis not present

## 2019-03-18 DIAGNOSIS — F419 Anxiety disorder, unspecified: Secondary | ICD-10-CM | POA: Diagnosis not present

## 2019-03-23 DIAGNOSIS — D649 Anemia, unspecified: Secondary | ICD-10-CM | POA: Diagnosis not present

## 2019-03-23 DIAGNOSIS — I272 Pulmonary hypertension, unspecified: Secondary | ICD-10-CM | POA: Diagnosis not present

## 2019-03-23 DIAGNOSIS — C499 Malignant neoplasm of connective and soft tissue, unspecified: Secondary | ICD-10-CM | POA: Diagnosis not present

## 2019-03-23 DIAGNOSIS — J449 Chronic obstructive pulmonary disease, unspecified: Secondary | ICD-10-CM | POA: Diagnosis not present

## 2019-03-23 DIAGNOSIS — I10 Essential (primary) hypertension: Secondary | ICD-10-CM | POA: Diagnosis not present

## 2019-03-23 DIAGNOSIS — F419 Anxiety disorder, unspecified: Secondary | ICD-10-CM | POA: Diagnosis not present

## 2019-03-25 DIAGNOSIS — I272 Pulmonary hypertension, unspecified: Secondary | ICD-10-CM | POA: Diagnosis not present

## 2019-03-25 DIAGNOSIS — C499 Malignant neoplasm of connective and soft tissue, unspecified: Secondary | ICD-10-CM | POA: Diagnosis not present

## 2019-03-25 DIAGNOSIS — J449 Chronic obstructive pulmonary disease, unspecified: Secondary | ICD-10-CM | POA: Diagnosis not present

## 2019-03-25 DIAGNOSIS — I10 Essential (primary) hypertension: Secondary | ICD-10-CM | POA: Diagnosis not present

## 2019-03-25 DIAGNOSIS — D649 Anemia, unspecified: Secondary | ICD-10-CM | POA: Diagnosis not present

## 2019-03-25 DIAGNOSIS — F419 Anxiety disorder, unspecified: Secondary | ICD-10-CM | POA: Diagnosis not present

## 2019-03-27 ENCOUNTER — Inpatient Hospital Stay (HOSPITAL_COMMUNITY)
Admission: EM | Admit: 2019-03-27 | Discharge: 2019-03-30 | DRG: 844 | Disposition: A | Discharge status: Home / Self Care | Payer: Medicare Other | Attending: Internal Medicine | Admitting: Internal Medicine

## 2019-03-27 ENCOUNTER — Emergency Department (HOSPITAL_COMMUNITY): Payer: Medicare Other

## 2019-03-27 ENCOUNTER — Encounter (HOSPITAL_COMMUNITY): Payer: Self-pay

## 2019-03-27 ENCOUNTER — Other Ambulatory Visit: Payer: Self-pay

## 2019-03-27 DIAGNOSIS — I2510 Atherosclerotic heart disease of native coronary artery without angina pectoris: Secondary | ICD-10-CM | POA: Diagnosis present

## 2019-03-27 DIAGNOSIS — R197 Diarrhea, unspecified: Secondary | ICD-10-CM | POA: Diagnosis not present

## 2019-03-27 DIAGNOSIS — K56600 Partial intestinal obstruction, unspecified as to cause: Secondary | ICD-10-CM | POA: Diagnosis not present

## 2019-03-27 DIAGNOSIS — K56699 Other intestinal obstruction unspecified as to partial versus complete obstruction: Secondary | ICD-10-CM | POA: Diagnosis not present

## 2019-03-27 DIAGNOSIS — I700 Atherosclerosis of aorta: Secondary | ICD-10-CM | POA: Diagnosis present

## 2019-03-27 DIAGNOSIS — Z8249 Family history of ischemic heart disease and other diseases of the circulatory system: Secondary | ICD-10-CM

## 2019-03-27 DIAGNOSIS — J9611 Chronic respiratory failure with hypoxia: Secondary | ICD-10-CM | POA: Diagnosis present

## 2019-03-27 DIAGNOSIS — R918 Other nonspecific abnormal finding of lung field: Secondary | ICD-10-CM | POA: Diagnosis not present

## 2019-03-27 DIAGNOSIS — G4733 Obstructive sleep apnea (adult) (pediatric): Secondary | ICD-10-CM | POA: Diagnosis present

## 2019-03-27 DIAGNOSIS — Z79899 Other long term (current) drug therapy: Secondary | ICD-10-CM

## 2019-03-27 DIAGNOSIS — K5669 Other partial intestinal obstruction: Secondary | ICD-10-CM | POA: Diagnosis not present

## 2019-03-27 DIAGNOSIS — F419 Anxiety disorder, unspecified: Secondary | ICD-10-CM | POA: Diagnosis present

## 2019-03-27 DIAGNOSIS — C494 Malignant neoplasm of connective and soft tissue of abdomen: Secondary | ICD-10-CM | POA: Diagnosis not present

## 2019-03-27 DIAGNOSIS — C801 Malignant (primary) neoplasm, unspecified: Secondary | ICD-10-CM | POA: Diagnosis present

## 2019-03-27 DIAGNOSIS — I10 Essential (primary) hypertension: Secondary | ICD-10-CM | POA: Diagnosis present

## 2019-03-27 DIAGNOSIS — C787 Secondary malignant neoplasm of liver and intrahepatic bile duct: Secondary | ICD-10-CM | POA: Diagnosis not present

## 2019-03-27 DIAGNOSIS — C7911 Secondary malignant neoplasm of bladder: Secondary | ICD-10-CM | POA: Diagnosis present

## 2019-03-27 DIAGNOSIS — J449 Chronic obstructive pulmonary disease, unspecified: Secondary | ICD-10-CM

## 2019-03-27 DIAGNOSIS — Z1159 Encounter for screening for other viral diseases: Secondary | ICD-10-CM

## 2019-03-27 DIAGNOSIS — Z6835 Body mass index (BMI) 35.0-35.9, adult: Secondary | ICD-10-CM

## 2019-03-27 DIAGNOSIS — I420 Dilated cardiomyopathy: Secondary | ICD-10-CM | POA: Diagnosis not present

## 2019-03-27 DIAGNOSIS — K56609 Unspecified intestinal obstruction, unspecified as to partial versus complete obstruction: Secondary | ICD-10-CM | POA: Diagnosis not present

## 2019-03-27 DIAGNOSIS — R1900 Intra-abdominal and pelvic swelling, mass and lump, unspecified site: Secondary | ICD-10-CM | POA: Diagnosis present

## 2019-03-27 DIAGNOSIS — K5730 Diverticulosis of large intestine without perforation or abscess without bleeding: Secondary | ICD-10-CM | POA: Diagnosis present

## 2019-03-27 DIAGNOSIS — Z7951 Long term (current) use of inhaled steroids: Secondary | ICD-10-CM

## 2019-03-27 DIAGNOSIS — C7989 Secondary malignant neoplasm of other specified sites: Secondary | ICD-10-CM | POA: Diagnosis not present

## 2019-03-27 DIAGNOSIS — Z8719 Personal history of other diseases of the digestive system: Secondary | ICD-10-CM

## 2019-03-27 DIAGNOSIS — R112 Nausea with vomiting, unspecified: Secondary | ICD-10-CM | POA: Diagnosis not present

## 2019-03-27 DIAGNOSIS — Z8551 Personal history of malignant neoplasm of bladder: Secondary | ICD-10-CM

## 2019-03-27 DIAGNOSIS — E8770 Fluid overload, unspecified: Secondary | ICD-10-CM | POA: Diagnosis present

## 2019-03-27 DIAGNOSIS — Z66 Do not resuscitate: Secondary | ICD-10-CM | POA: Diagnosis not present

## 2019-03-27 DIAGNOSIS — D638 Anemia in other chronic diseases classified elsewhere: Secondary | ICD-10-CM

## 2019-03-27 DIAGNOSIS — R531 Weakness: Secondary | ICD-10-CM | POA: Diagnosis not present

## 2019-03-27 DIAGNOSIS — R7989 Other specified abnormal findings of blood chemistry: Secondary | ICD-10-CM | POA: Diagnosis present

## 2019-03-27 DIAGNOSIS — E785 Hyperlipidemia, unspecified: Secondary | ICD-10-CM | POA: Diagnosis present

## 2019-03-27 DIAGNOSIS — Z888 Allergy status to other drugs, medicaments and biological substances status: Secondary | ICD-10-CM

## 2019-03-27 DIAGNOSIS — Z20828 Contact with and (suspected) exposure to other viral communicable diseases: Secondary | ICD-10-CM | POA: Diagnosis not present

## 2019-03-27 DIAGNOSIS — Z87891 Personal history of nicotine dependence: Secondary | ICD-10-CM

## 2019-03-27 DIAGNOSIS — I272 Pulmonary hypertension, unspecified: Secondary | ICD-10-CM | POA: Diagnosis present

## 2019-03-27 DIAGNOSIS — E669 Obesity, unspecified: Secondary | ICD-10-CM | POA: Diagnosis present

## 2019-03-27 DIAGNOSIS — Z9981 Dependence on supplemental oxygen: Secondary | ICD-10-CM

## 2019-03-27 LAB — COMPREHENSIVE METABOLIC PANEL
ALT: 31 U/L (ref 0–44)
AST: 43 U/L — ABNORMAL HIGH (ref 15–41)
Albumin: 3.5 g/dL (ref 3.5–5.0)
Alkaline Phosphatase: 66 U/L (ref 38–126)
Anion gap: 8 (ref 5–15)
BUN: 11 mg/dL (ref 8–23)
CO2: 24 mmol/L (ref 22–32)
Calcium: 8.5 mg/dL — ABNORMAL LOW (ref 8.9–10.3)
Chloride: 103 mmol/L (ref 98–111)
Creatinine, Ser: 0.76 mg/dL (ref 0.61–1.24)
GFR calc Af Amer: >60 mL/min (ref >60)
GFR calc non Af Amer: >60 mL/min (ref >60)
Glucose, Bld: 134 mg/dL — ABNORMAL HIGH (ref 70–99)
Potassium: 4.9 mmol/L (ref 3.5–5.1)
Sodium: 135 mmol/L (ref 135–145)
Total Bilirubin: 2.1 mg/dL — ABNORMAL HIGH (ref 0.3–1.2)
Total Protein: 6.8 g/dL (ref 6.5–8.1)

## 2019-03-27 LAB — CBC
HCT: 26.2 % — ABNORMAL LOW (ref 39.0–52.0)
Hemoglobin: 8.1 g/dL — ABNORMAL LOW (ref 13.0–17.0)
MCH: 30.6 pg (ref 26.0–34.0)
MCHC: 30.9 g/dL (ref 30.0–36.0)
MCV: 98.9 fL (ref 80.0–100.0)
Platelets: 166 10*3/uL (ref 150–400)
RBC: 2.65 MIL/uL — ABNORMAL LOW (ref 4.22–5.81)
RDW: 23.7 % — ABNORMAL HIGH (ref 11.5–15.5)
WBC: 5.1 10*3/uL (ref 4.0–10.5)
nRBC: 0.6 % — ABNORMAL HIGH (ref 0.0–0.2)

## 2019-03-27 LAB — SARS CORONAVIRUS 2 BY RT PCR (HOSPITAL ORDER, PERFORMED IN ~~LOC~~ HOSPITAL LAB): SARS Coronavirus 2: NEGATIVE

## 2019-03-27 MED ORDER — IOHEXOL 300 MG/ML  SOLN
100.0000 mL | Freq: Once | INTRAMUSCULAR | Status: AC | PRN
  Administered 2019-03-27: 100 mL via INTRAVENOUS

## 2019-03-27 MED ORDER — IPRATROPIUM-ALBUTEROL 0.5-2.5 (3) MG/3ML IN SOLN
3.0000 mL | RESPIRATORY_TRACT | Status: DC | PRN
  Administered 2019-03-27: 21:00:00 3 mL via RESPIRATORY_TRACT
  Filled 2019-03-27: qty 3

## 2019-03-27 MED ORDER — SODIUM CHLORIDE 0.9% FLUSH
10.0000 mL | Freq: Two times a day (BID) | INTRAVENOUS | Status: DC
  Administered 2019-03-27 – 2019-03-30 (×2): 10 mL

## 2019-03-27 MED ORDER — SODIUM CHLORIDE 0.9% FLUSH: 10.0000 mL | INTRAVENOUS | Status: DC | PRN

## 2019-03-27 MED ORDER — ALPRAZOLAM 0.5 MG PO TABS
0.5000 mg | ORAL_TABLET | Freq: Every day | ORAL | Status: DC
  Administered 2019-03-27: 0.5 mg via ORAL
  Filled 2019-03-27: qty 1

## 2019-03-27 MED ORDER — FLUTICASONE PROPIONATE 50 MCG/ACT NA SUSP: 2.0000 | Freq: Every day | NASAL | Status: DC | PRN

## 2019-03-27 MED ORDER — IPRATROPIUM BROMIDE 0.02 % IN SOLN
0.5000 mg | Freq: Once | RESPIRATORY_TRACT | Status: AC
  Administered 2019-03-27: 0.5 mg via RESPIRATORY_TRACT
  Filled 2019-03-27: qty 2.5

## 2019-03-27 MED ORDER — IPRATROPIUM-ALBUTEROL 0.5-2.5 (3) MG/3ML IN SOLN
3.0000 mL | Freq: Four times a day (QID) | RESPIRATORY_TRACT | Status: DC
  Administered 2019-03-27 – 2019-03-28 (×2): 3 mL via RESPIRATORY_TRACT
  Filled 2019-03-27 (×2): qty 3

## 2019-03-27 MED ORDER — ONDANSETRON HCL 4 MG/2ML IJ SOLN
4.0000 mg | Freq: Once | INTRAMUSCULAR | Status: AC
  Administered 2019-03-27: 4 mg via INTRAVENOUS
  Filled 2019-03-27: qty 2

## 2019-03-27 MED ORDER — LISINOPRIL 10 MG PO TABS
10.0000 mg | ORAL_TABLET | Freq: Every day | ORAL | Status: DC
  Administered 2019-03-28 – 2019-03-30 (×3): 10 mg via ORAL
  Filled 2019-03-27 (×3): qty 1

## 2019-03-27 MED ORDER — HEPARIN SODIUM (PORCINE) 5000 UNIT/ML IJ SOLN
5000.0000 [IU] | Freq: Three times a day (TID) | INTRAMUSCULAR | Status: DC
  Administered 2019-03-27: 22:00:00 5000 [IU] via SUBCUTANEOUS
  Filled 2019-03-27: qty 1

## 2019-03-27 MED ORDER — ALBUTEROL SULFATE (2.5 MG/3ML) 0.083% IN NEBU
5.0000 mg | INHALATION_SOLUTION | Freq: Once | RESPIRATORY_TRACT | Status: AC
  Administered 2019-03-27: 5 mg via RESPIRATORY_TRACT
  Filled 2019-03-27: qty 6

## 2019-03-27 MED ORDER — SODIUM CHLORIDE 0.9 % IV SOLN
INTRAVENOUS | Status: DC
  Administered 2019-03-27: 20:00:00 via INTRAVENOUS

## 2019-03-27 MED ORDER — ONDANSETRON HCL 4 MG/2ML IJ SOLN: 4.0000 mg | Freq: Four times a day (QID) | INTRAMUSCULAR | Status: DC | PRN

## 2019-03-27 MED ORDER — ALBUTEROL SULFATE 108 (90 BASE) MCG/ACT IN AEPB: 2.0000 | INHALATION_SPRAY | Freq: Four times a day (QID) | RESPIRATORY_TRACT | Status: DC | PRN

## 2019-03-27 MED ORDER — SODIUM CHLORIDE 0.9 % IV BOLUS
1000.0000 mL | Freq: Once | INTRAVENOUS | Status: AC
  Administered 2019-03-27: 1000 mL via INTRAVENOUS

## 2019-03-27 MED ORDER — METHOCARBAMOL 500 MG PO TABS
500.0000 mg | ORAL_TABLET | Freq: Two times a day (BID) | ORAL | Status: DC | PRN
  Administered 2019-03-27: 500 mg via ORAL
  Filled 2019-03-27: qty 1

## 2019-03-27 MED ORDER — SODIUM CHLORIDE (PF) 0.9 % IJ SOLN
INTRAMUSCULAR | Status: AC
  Filled 2019-03-27: qty 50

## 2019-03-27 MED ORDER — BUDESONIDE 0.25 MG/2ML IN SUSP
0.2500 mg | Freq: Two times a day (BID) | RESPIRATORY_TRACT | Status: DC
  Administered 2019-03-27 – 2019-03-28 (×2): 0.25 mg via RESPIRATORY_TRACT
  Filled 2019-03-27 (×2): qty 2

## 2019-03-27 NOTE — ED Triage Notes (Addendum)
Pt reports vomiting and diarrhea on Sunday night and since then has felt really weak. Pt is normally on 6L of O2 for COPD. Pt reports calling PCP and was told to come here due to high heart rate and weakness. Pt denies any cough or abnormal SHOB

## 2019-03-27 NOTE — ED Notes (Signed)
ED TO INPATIENT HANDOFF REPORT  ED Nurse Name and Phone #: [phone]  S Name/Age/Gender [name] 73 y.o. male Room/Bed: WA06/WA06  Code Status   Code Status: DNR  Home/SNF/Other Home Patient oriented to: self, place, time and situation Is this baseline? Yes   Triage Complete: Triage complete  Chief Complaint emesis / weakness   Triage Note Pt reports vomiting and diarrhea on Sunday night and since then has felt really weak. Pt is normally on 6L of O2 for COPD. Pt reports calling PCP and was told to come here due to high heart rate and weakness. Pt denies any cough or abnormal SHOB   Allergies Allergies  Allergen Reactions  . Amlodipine Swelling    Level of Care/Admitting Diagnosis ED Disposition    ED Disposition Condition Comment   Admit  Hospital Area: [facility] [[id number]]  Level of Care: Med-Surg [16]  Covid Evaluation: N/A  Diagnosis: SBO (small bowel obstruction) [facility]) [[id number]]  Admitting Physician: Georgette Shell [[id number]]  Attending Physician: Georgette Shell [phone]  PT Class (Do Not Modify): Observation [104]  PT Acc Code (Do Not Modify): Observation [10022]       B Medical/Surgery History Past Medical History:  Diagnosis Date  . Anxiety   . Bladder cancer ([place]) 2011  . Borderline diabetes mellitus   . CAD (coronary artery disease)   . Cigarette smoker   . COPD (chronic obstructive pulmonary disease) ([place])   . DJD (degenerative joint disease)   . Emphysema of lung ([place])   . Epistaxis   . History of colonic polyps 2004   hyperplastic   . History of sinusitis   . Hypercholesteremia   . Hypertension   . Obesity    Past Surgical History:  Procedure Laterality Date  . cataract surgery  septemeber 2013  . cataract surgery/sub posterior vitrectomy in left eye  1996   Dr. Zadie Rhine  . PTCA  [phone]     A IV Location/Drains/Wounds Patient Lines/Drains/Airways Status   Active  Line/Drains/Airways    Name:   Placement date:   Placement time:   Site:   Days:   Midline Single Lumen 03/27/19 Midline Right Brachial 10 cm 0 cm   03/27/19    1359    Brachial   less than 1          Intake/Output Last 24 hours No intake or output data in the 24 hours ending 03/27/19 1836  Labs/Imaging Results for orders placed or performed during the hospital encounter of 03/27/19 (from the past 48 hour(s))  CBC     Status: Abnormal   Collection Time: 03/27/19 12:25 PM  Result Value Ref Range   WBC 5.1 4.0 - 10.5 K/uL   RBC 2.65 (L) 4.22 - 5.81 MIL/uL   Hemoglobin 8.1 (L) 13.0 - 17.0 g/dL   HCT 26.2 (L) 39.0 - 52.0 %   MCV 98.9 80.0 - 100.0 fL   MCH 30.6 26.0 - 34.0 pg   MCHC 30.9 30.0 - 36.0 g/dL   RDW 23.7 (H) 11.5 - 15.5 %   Platelets 166 150 - 400 K/uL   nRBC 0.6 (H) 0.0 - 0.2 %    Comment: Performed at [facility], [place] [address] [place], [place] [address]  Comprehensive metabolic panel     Status: Abnormal   Collection Time: 03/27/19 12:25 PM  Result Value Ref Range   Sodium 135 135 - 145 mmol/L   Potassium 4.9 3.5 - 5.1 mmol/L  Chloride 103 98 - 111 mmol/L   CO2 24 22 - 32 mmol/L   Glucose, Bld 134 (H) 70 - 99 mg/dL   BUN 11 8 - 23 mg/dL   Creatinine, Ser 0.76 0.61 - 1.24 mg/dL   Calcium 8.5 (L) 8.9 - 10.3 mg/dL   Total Protein 6.8 6.5 - 8.1 g/dL   Albumin 3.5 3.5 - 5.0 g/dL   AST 43 (H) 15 - 41 U/L   ALT 31 0 - 44 U/L   Alkaline Phosphatase 66 38 - 126 U/L   Total Bilirubin 2.1 (H) 0.3 - 1.2 mg/dL   GFR calc non Af Amer >60 >60 mL/min   GFR calc Af Amer >60 >60 mL/min   Anion gap 8 5 - 15    Comment: Performed at [facility], [place] [address], [place], [place] [address]  SARS Coronavirus 2 (CEPHEID - Performed in [facility] hospital lab), Hosp Order     Status: None   Collection Time: 03/27/19 12:25 PM  Result Value Ref Range   SARS Coronavirus 2 NEGATIVE NEGATIVE    Comment: (NOTE) If result is  NEGATIVE SARS-CoV-2 target nucleic acids are NOT DETECTED. The SARS-CoV-2 RNA is generally detectable in upper and lower  respiratory specimens during the acute phase of infection. The lowest  concentration of SARS-CoV-2 viral copies this assay can detect is 250  copies / mL. A negative result does not preclude SARS-CoV-2 infection  and should not be used as the sole basis for treatment or other  patient management decisions.  A negative result may occur with  improper specimen collection / handling, submission of specimen other  than nasopharyngeal swab, presence of viral mutation(s) within the  areas targeted by this assay, and inadequate number of viral copies  (<250 copies / mL). A negative result must be combined with clinical  observations, patient history, and epidemiological information. If result is POSITIVE SARS-CoV-2 target nucleic acids are DETECTED. The SARS-CoV-2 RNA is generally detectable in upper and lower  respiratory specimens dur ing the acute phase of infection.  Positive  results are indicative of active infection with SARS-CoV-2.  Clinical  correlation with patient history and other diagnostic information is  necessary to determine patient infection status.  Positive results do  not rule out bacterial infection or co-infection with other viruses. If result is PRESUMPTIVE POSTIVE SARS-CoV-2 nucleic acids MAY BE PRESENT.   A presumptive positive result was obtained on the submitted specimen  and confirmed on repeat testing.  While 2019 novel coronavirus  (SARS-CoV-2) nucleic acids may be present in the submitted sample  additional confirmatory testing may be necessary for epidemiological  and / or clinical management purposes  to differentiate between  SARS-CoV-2 and other Sarbecovirus currently known to infect humans.  If clinically indicated additional testing with an alternate test  methodology [phone]) is advised. The SARS-CoV-2 RNA is generally  detectable  in upper and lower respiratory sp ecimens during the acute  phase of infection. The expected result is Negative. Fact Sheet for Patients:  StrictlyIdeas.no Fact Sheet for Healthcare Providers: BankingDealers.co.za This test is not yet approved or cleared by the [place] FDA and has been authorized for detection and/or diagnosis of SARS-CoV-2 by FDA under an Emergency Use Authorization (EUA).  This EUA will remain in effect (meaning this test can be used) for the duration of the COVID-19 declaration under Section 564(b)(1) of the Act, 21 U.S.C. section 360bbb-3(b)(1), unless the authorization is terminated or revoked sooner. Performed at  [facility], [place] [address], [place], [place] [address]   Type and screen [place]     Status: None   Collection Time: 03/27/19  1:51 PM  Result Value Ref Range   ABO/RH(D) A POS    Antibody Screen NEG    Sample Expiration      03/30/2019,2359 Performed at [facility], [place] [address], [place], [place] [address]    Ct Chest W Contrast  Result Date: 03/27/2019 CLINICAL DATA:  Abdominal spindle cell sarcoma with known hepatic metastatic disease. Enlarging pulmonary nodules on recent chest radiograph. Vomiting and diarrhea. Weakness. Home oxygen for COPD. Tachycardia. EXAM: CT CHEST, ABDOMEN, AND PELVIS WITH CONTRAST TECHNIQUE: Multidetector CT imaging of the chest, abdomen and pelvis was performed following the standard protocol during bolus administration of intravenous contrast. CONTRAST:  173mL OMNIPAQUE IOHEXOL 300 MG/ML  SOLN COMPARISON:  Multiple exams, including 12/14/2018, 02/17/2017, and chest radiograph from 03/27/2019 FINDINGS: CT CHEST FINDINGS Cardiovascular: Coronary, aortic arch, and branch vessel atherosclerotic vascular disease. Mediastinum/Nodes: Unremarkable Lungs/Pleura: Scattered pulmonary nodules in the lungs are mostly new  compared to 02/17/2017 although several are stable. A new right upper lobe nodule on image 45/6 measures 1.5 by 1.5 cm. A new right lower lobe nodule on image 115/6 measures 2.6 by 2.1 cm. A new left lower lobe nodule on image 70/6 measures 1.8 by 1.5 cm. Multiple additional new nodules are present. Markedly severe emphysema. Musculoskeletal: Lower thoracic spondylosis. CT ABDOMEN PELVIS FINDINGS Hepatobiliary: Prior chemoembolization of a left hepatic lobe lesion with coils noted and a distal left hepatic artery branch, and with a cystic and rim calcified treated lesion measuring 4.8 by 3.7 cm on image 56/2, formerly 5.4 by 4.1 cm on 12/14/2018. Medially in segment 2 of the liver, a 3.7 by 2.8 cm hypodense lesion on image 49/2 was previously 4.8 by 3.9 cm by my measurements, a significant improvement. 1.0 cm gallstone in the gallbladder. Ill-defined potential 2.0 by 1.2 cm lesion in segment 5 of the liver on image 65/2, not well appreciated on prior CT exams, and only vaguely seen today. In the dome of the right hepatic lobe there is a suggestion of a 1.0 cm hyperdense lesion on image 51/2 which could be a small hemangioma but which is technically nonspecific. Pancreas: Unremarkable Spleen: Unremarkable Adrenals/Urinary Tract: 6.6 cm in diameter right mid kidney fluid density cyst. Adrenal glands normal. Urinary bladder unremarkable. Stomach/Bowel: Small-bowel obstruction with dilated and thick-walled loops of small bowel up to 4.4 cm in diameter. Mild associated mesenteric edema along the involved loops. The transition point is along the anterior margin of the large right abdominal tumor which is at least effacing and extrinsically compressing the involved small bowel loop, and invasion of the loop is not excluded. Sigmoid colon diverticulosis. Vascular/Lymphatic: Aortoiliac atherosclerotic vascular disease. Reproductive: Unremarkable Other: Eccentric to the right in the abdomen, the large abdominal mass currently  measures 24.0 by 16.0 by 18.5 cm (volume = 3720 cm^3). There is adjacent ascites in the right lower quadrant, nonspecific for malignant versus bland ascites. Musculoskeletal: Spondylosis and degenerative disc disease at L5-S1 causing bilateral foraminal impingement. IMPRESSION: 1. Small-bowel obstruction due to the large right-sided tumor. Dilated thick-walled loops of small bowel extend to a loop which runs anterior to the tumor. This loop is severely effaced and likely compressed by the tumor, and invasion is not excluded. 2. Large right-sided intra-abdominal tumor currently measures at 3720 cubic cm, previously 3110 cubic cm on 12/14/2018. 3. Compared  to the most recent chest CT from 02/17/2017, there are various scattered new pulmonary nodules measuring up to about 2.6 cm in diameter, compatible with metastatic disease. Several of the pulmonary nodules are chronic but most are new. 4. Further reduction in size of the lateral segment left hepatic lobe lesions, status post prior embolization. However, there is a questionable new 2.0 cm ill-defined lesion in segment 5 of the liver which could be a new metastatic lesion. 5. Markedly severe emphysema.  Emphysema (ICD10-J43.9). 6.  Aortic Atherosclerosis (ICD10-I70.0).  Coronary atherosclerosis. 7. Lumbar impingement L5-S1. 8. Sigmoid colon diverticulosis. Electronically Signed   By: Van Clines M.D.   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 Enlarging pulmonary nodules on recent chest radiograph. Vomiting and diarrhea. Weakness. Home oxygen for COPD. Tachycardia. EXAM: CT CHEST, ABDOMEN, AND PELVIS WITH CONTRAST TECHNIQUE: Multidetector CT imaging of the chest, abdomen and pelvis was performed following the standard protocol during bolus administration of intravenous contrast. CONTRAST:  128mL OMNIPAQUE IOHEXOL 300 MG/ML  SOLN  COMPARISON:  Multiple exams, including 12/14/2018, 02/17/2017, and chest radiograph from 03/27/2019 FINDINGS: CT CHEST FINDINGS Cardiovascular: Coronary, aortic arch, and branch vessel atherosclerotic vascular disease. Mediastinum/Nodes: Unremarkable Lungs/Pleura: Scattered pulmonary nodules in the lungs are mostly new compared to 02/17/2017 although several are stable. A new right upper lobe nodule on image 45/6 measures 1.5 by 1.5 cm. A new right lower lobe nodule on image 115/6 measures 2.6 by 2.1 cm. A new left lower lobe nodule on image 70/6 measures 1.8 by 1.5 cm. Multiple additional new nodules are present. Markedly severe emphysema. Musculoskeletal: Lower thoracic spondylosis. CT ABDOMEN PELVIS FINDINGS Hepatobiliary: Prior chemoembolization of a left hepatic lobe lesion with coils noted and a distal left hepatic artery branch, and with a cystic and rim calcified treated lesion measuring 4.8 by 3.7 cm on image 56/2, formerly 5.4 by 4.1 cm on 12/14/2018. Medially in segment 2 of the liver, a 3.7 by 2.8 cm hypodense lesion on image 49/2 was previously 4.8 by 3.9 cm by my measurements, a significant improvement. 1.0 cm gallstone in the gallbladder. Ill-defined potential 2.0 by 1.2 cm lesion in segment 5 of the liver on image 65/2, not well appreciated on prior CT exams, and only vaguely seen today. In the dome of the right hepatic lobe there is a suggestion of a 1.0 cm hyperdense lesion on image 51/2 which could be a small hemangioma but which is technically nonspecific. Pancreas: Unremarkable Spleen: Unremarkable Adrenals/Urinary Tract: 6.6 cm in diameter right mid kidney fluid density cyst. Adrenal glands normal. Urinary bladder unremarkable. Stomach/Bowel: Small-bowel obstruction with dilated and thick-walled loops of small bowel up to 4.4 cm in diameter. Mild associated mesenteric edema along the involved loops. The transition point is along the anterior margin of the large right abdominal tumor which is at  least effacing and extrinsically compressing the involved small bowel loop, and invasion of the loop is not excluded. Sigmoid colon diverticulosis. Vascular/Lymphatic: Aortoiliac atherosclerotic vascular disease. Reproductive: Unremarkable Other: Eccentric to the right in the abdomen, the large abdominal mass currently measures 24.0 by 16.0 by 18.5 cm (volume = 3720 cm^3). There is adjacent ascites in the right lower quadrant, nonspecific for malignant versus bland ascites. Musculoskeletal: Spondylosis and degenerative disc disease at L5-S1 causing bilateral foraminal impingement. IMPRESSION: 1. Small-bowel obstruction due to the large right-sided tumor. Dilated thick-walled loops of small bowel extend to a loop  which runs anterior to the tumor. This loop is severely effaced and likely compressed by the tumor, and invasion is not excluded. 2. Large right-sided intra-abdominal tumor currently measures at 3720 cubic cm, previously 3110 cubic cm on 12/14/2018. 3. Compared to the most recent chest CT from 02/17/2017, there are various scattered new pulmonary nodules measuring up to about 2.6 cm in diameter, compatible with metastatic disease. Several of the pulmonary nodules are chronic but most are new. 4. Further reduction in size of the lateral segment left hepatic lobe lesions, status post prior embolization. However, there is a questionable new 2.0 cm ill-defined lesion in segment 5 of the liver which could be a new metastatic lesion. 5. Markedly severe emphysema.  Emphysema (ICD10-J43.9). 6.  Aortic Atherosclerosis (ICD10-I70.0).  Coronary atherosclerosis. 7. Lumbar impingement L5-S1. 8. Sigmoid colon diverticulosis. Electronically Signed   By: Van Clines M.D.   On: 03/27/2019 16:18   Dg Chest Portable 1 View  Result Date: 03/27/2019 CLINICAL DATA:  Weakness with nausea and vomiting EXAM: PORTABLE CHEST 1 VIEW COMPARISON:  02/19/2019, CT from 02/17/2017 FINDINGS: Cardiac shadow is stable. Aortic  calcifications are noted. The lungs are well aerated bilaterally and demonstrate multiple pulmonary nodules increased from the prior exam. Largest of these lies in the right upper lobe measuring 2.1 cm in greatest dimension. No acute bony abnormality is noted. IMPRESSION: Enlarging pulmonary nodules bilaterally. CT of the chest is recommended for further evaluation. Electronically Signed   By: Inez Catalina M.D.   On: 03/27/2019 12:48    Pending Labs Unresulted Labs (From admission, onward)    Start     Ordered   03/28/19 0500  CBC  Tomorrow morning,   R    Question:  Specimen collection method  Answer:  IV Team=IV Team collect   03/27/19 1738   03/28/19 0500  Comprehensive metabolic panel  Tomorrow morning,   R    Question:  Specimen collection method  Answer:  IV Team=IV Team collect   03/27/19 1738          Vitals/Pain Today's Vitals   03/27/19 1630 03/27/19 1700 03/27/19 1800 03/27/19 1830  BP: (!) 107/55 128/68 121/64 117/71  Pulse: (!) 126 (!) 110 (!) 103 97  Resp: (!) 26 17 19    Temp:      TempSrc:      SpO2: 98% 97% 99% 100%  Weight:      Height:      PainSc:        Isolation Precautions No active isolations  Medications Medications  sodium chloride flush (NS) 0.9 % injection 10-40 mL (10 mLs Intracatheter Given 03/27/19 1419)  sodium chloride flush (NS) 0.9 % injection 10-40 mL (has no administration in time range)  sodium chloride (PF) 0.9 % injection (has no administration in time range)  heparin injection 5,000 Units (has no administration in time range)  0.9 %  sodium chloride infusion (has no administration in time range)  ondansetron (ZOFRAN) injection 4 mg (has no administration in time range)  Albuterol Sulfate AEPB 2 puff (has no administration in time range)  ALPRAZolam (XANAX) tablet 0.5 mg (has no administration in time range)  budesonide (PULMICORT) nebulizer solution 0.25 mg (has no administration in time range)  fluticasone (FLONASE) 50 MCG/ACT nasal  spray 2 spray (has no administration in time range)  ipratropium-albuterol (DUONEB) 0.5-2.5 (3) MG/3ML nebulizer solution 3 mL (has no administration in time range)  lisinopril (ZESTRIL) tablet 10 mg (has no administration in time range)  sodium  chloride 0.9 % bolus 1,000 mL (1,000 mLs Intravenous New Bag/Given (Non-Interop) 03/27/19 1413)  ondansetron (ZOFRAN) injection 4 mg (4 mg Intravenous Given 03/27/19 1413)  albuterol (PROVENTIL) (2.5 MG/3ML) 0.083% nebulizer solution 5 mg (5 mg Nebulization Given 03/27/19 1417)  ipratropium (ATROVENT) nebulizer solution 0.5 mg (0.5 mg Nebulization Given 03/27/19 1417)  iohexol (OMNIPAQUE) 300 MG/ML solution 100 mL (100 mLs Intravenous Contrast Given 03/27/19 1521)    Mobility walks Low fall risk   Focused Assessments Pulmonary Assessment Handoff:  Lung sounds:   O2 Device: Nasal Cannula O2 Flow Rate (L/min): 6 L/min      R Recommendations: See Admitting Provider Note  Report given to:   Additional Notes:

## 2019-03-27 NOTE — Consult Note (Signed)
Re:   DAILY CRATE DOB:   10-Sep-1946 MRN:   [id number]  Chief Complaint Tachycardia, nausea and vomiting  ASSESEMENT AND PLAN: 1.  [name]  Followed by Dr. Merrie [name] at [facility]  Has seen Dr. Roma [name] for surgery and did not want an attempted resection  Partial SBO, but tolerating liquids over the last few days.  I think that is management is let [name] eat what [name] can tolerate.  There is no role for surgery.  [name] has made that decision.  I'll advance [name] to clear liquids tonight.  Since there is no obvious surgical plans, will sign off.  2.  CAD  [name] came in because of tachycardia.  [name] has his own pulse oximeter. 3.  COPD/severe emphysema 4.  OSA on BiPAP 5.  HTN 6.  Pulmonary nodules 7.  Obese 8.  Anemia   Hgb - 8.1 - 03/27/2019  Chief Complaint  [name] presents with  . Emesis  . Diarrhea  . Weakness   PHYSICIAN REQUESTING CONSULTATION:  Dr. Marylene [name], [facility]  HISTORY OF PRESENT ILLNESS: [name] is a 73 y.o. (DOB: Aug 16, 1946)  white male whose primary care physician is Andrew Hatchet, MD..   [name] came to the ER because of an increased pulse.  [name] carries his own pulse oximeter.  On Sunday, 5/10, had nausea and vomiting.  [name] has had some diarrhea.  This is better for [name] over the last 2 days.  Recently hospitalized form 4/5 - 02/21/2019 for GI bleed and elevated troponin.  [name] is followed for his large abdominal tumor at [facility] by Dr. Merrie [name].    [name] had radiation tx to the tumor about 3 years ago.  [name] was seen by Dr. Clovis [name] and had a discussion about surgical resection, but the [name] decided against that option.  CT scan on 03/27/2019:  1. Small-bowel obstruction due to the large right-sided tumor (24 x 16 cm).  Dilated thick-walled loops of small bowel extend to a loop which runs anterior to the tumor. This loop is severely effaced and likely compressed by the tumor, and invasion is not excluded.  2. Large  right-sided intra-abdominal tumor currently measures at 3720 cubic cm, previously 3110 cubic cm on 12/14/2018.  3. Compared to the most recent chest CT from 02/17/2017, there are various scattered new pulmonary nodules measuring up to about 2.6 cm in diameter, compatible with metastatic disease. Several of the pulmonary nodules are chronic but most are new.  4. Further reduction in size of the lateral segment left hepatic lobe lesions, status post prior embolization. However, there is a questionable new 2.0 cm ill-defined lesion in segment 5 of the liver which could be a new metastatic lesion.  5. Markedly severe emphysema.  Emphysema (ICD10-J43.9).  6.  Aortic Atherosclerosis (ICD10-I70.0).  Coronary atherosclerosis.  7. Lumbar impingement L5-S1.  8. Sigmoid colon diverticulosis.   Past Medical History:  Diagnosis Date  . Anxiety   . 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 Borderline diabetes mellitus   . CAD (coronary artery disease)   . Cigarette smoker   . COPD (chronic obstructive pulmonary disease) ([place])   . DJD (degenerative joint disease)   . Emphysema of lung ([place])   . Epistaxis   . History of colonic polyps 2004   hyperplastic   . History of sinusitis   . Hypercholesteremia   . Hypertension   . Obesity  Past Surgical History:  Procedure Laterality Date  . cataract surgery  septemeber 2013  . cataract surgery/sub posterior vitrectomy in left eye  1996   Dr. Zadie Rhine  . PTCA  [phone]      Current Facility-Administered Medications  Medication Dose Route Frequency Provider Last Rate Last Dose  . 0.9 %  sodium chloride infusion   Intravenous Continuous Georgette Shell, MD 75 mL/hr at 03/27/19 1935    . ALPRAZolam Duanne Moron) tablet 0.5 mg  0.5 mg Oral QHS Georgette Shell, MD      . budesonide (PULMICORT) nebulizer solution 0.25 mg  0.25 mg Nebulization BID Georgette Shell, MD      . fluticasone [facility]) 50 MCG/ACT nasal spray 2 spray  2 spray Each Nare Daily  PRN Georgette Shell, MD      . heparin injection 5,000 Units  5,000 Units Subcutaneous Q8H Georgette Shell, MD      . ipratropium-albuterol (DUONEB) 0.5-2.5 (3) MG/3ML nebulizer solution 3 mL  3 mL Nebulization Q4H PRN Georgette Shell, MD      . Derrill Memo ON 03/28/2019] lisinopril (ZESTRIL) tablet 10 mg  10 mg Oral Daily Georgette Shell, MD      . ondansetron [facility]) injection 4 mg  4 mg Intravenous Q6H PRN Georgette Shell, MD      . sodium chloride (PF) 0.9 % injection           . sodium chloride flush (NS) 0.9 % injection 10-40 mL  10-40 mL Intracatheter Q12H Jola Schmidt, MD   10 mL at 03/27/19 1419  . sodium chloride flush (NS) 0.9 % injection 10-40 mL  10-40 mL Intracatheter PRN Jola Schmidt, MD          Allergies  Allergen Reactions  . Amlodipine Swelling    REVIEW OF SYSTEMS: Skin:  No history of rash.  No history of abnormal moles. Infection:  No history of hepatitis or HIV.  No history of MRSA. Neurologic:  No history of stroke.  No history of seizure.  No history of headaches. Cardiac:  CAD.   Pulmonary:  COPD/severe emphysema.   OSA on BiPAP.  Quit smoking about 3 years ago.  Endocrine:  No diabetes. No thyroid disease. Gastrointestinal:  See HPI. Urologic:  No history of kidney stones.  No history of [name] infections. Musculoskeletal:  No history of joint or back disease. Hematologic:  No bleeding disorder.  No history of anemia.  Not anticoagulated. Psycho-social:  The [name] is oriented.   The [name] has no obvious psychologic or social impairment to understanding our conversation and plan.  SOCIAL and FAMILY HISTORY: Married.  Wife is [name] [phone]  I talked to his wife on the phone. [name] has two daughters, 24 yo and 70 yo, both live near [name].  PHYSICAL EXAM: BP 117/71   Pulse 96   Temp 98.3 F (36.8 C) (Oral)   Resp 19   Ht 5\' 6"  (1.676 m)   Wt 99.3 kg   SpO2 100%   BMI 35.35 kg/m   General: Obese WM who is alert.  [name]  purses his lips to breath. Skin:  Inspection and palpation - no mass or rash. Eyes:  Conjunctiva and lids unremarkable.            Pupils are equal Ears, Nose, Mouth, and Throat:  Ears and nose unremarkable            Lips and teeth are unremarable. Neck: Supple. No mass, trachea  midline.  No thyroid mass. Lymph Nodes:  No supraclavicular, cervical, or inguinal nodes. Lungs: Normal respiratory effort.  Clear to auscultation and symmetric breath sounds. Heart:  Palpation of the heart is normal.            Auscultation: Tachycardia. No murmur or rub.  Abdomen: Obese. Normal bowel sounds.  No abdominal scars. [name] has a large mass that dominates the right of his abdomen.  It is not tender.             Rectal: Not done. Musculoskeletal:  Good muscle strength and ROM  in upper and lower extremities.  Neurologic:  Grossly intact to motor and sensory function. Psychiatric: Normal judgement and insight. Behavior is normal.            Oriented to time, person, place.   DATA REVIEWED, COUNSELING AND COORDINATION OF CARE: Epic notes reviewed. Counseling and coordination of care exceeded more than 50% of the time spent with [name]. Total time spent with [name] and charting: 45 minutes  Alphonsa Overall, MD,  [facility], [address]  [place], [place]    [place] Phone:  [phone] FAX:  [phone]

## 2019-03-27 NOTE — ED Provider Notes (Signed)
I received this patient in signout from Dr. Venora Maples.  He had presented with vomiting and diarrhea as well as generalized weakness, awaiting CT scans at time of signout.  He has known metastatic spindle cell cancer.  CT of chest shows evidence of new metastatic spread.  CT of abdomen and pelvis notable for new small bowel obstruction related to large, compressive tumor. Discussed results with patient who understands he is likely not a surgical candidate but is open to admission for symptom control. Discussed findings with general surgery, Dr. Lucia Gaskins, and they will see the patient in consultation.  Because the patient has not had significant vomiting here in the ED, he advised that we can hold off on NG tube for now.  Discussed admission with [facility] hospitalist, Dr. Rodena Piety, and pt admitted for further treatment.   Prerna Harold, Wenda Overland, MD 03/27/19 1731

## 2019-03-27 NOTE — ED Notes (Signed)
Patient transported to CT 

## 2019-03-27 NOTE — ED Provider Notes (Signed)
[facility] DEPT Provider Note   CSN: [id number] Arrival date & time: 03/27/19  1118    History   Chief Complaint Chief Complaint  Patient presents with  . Emesis  . Diarrhea  . Weakness    HPI [name] is a 73 y.o. male.     HPI 73 year old male with a history of oxygen dependent COPD on 6 L nasal cannula presents the emergency department with complaints of nausea vomiting diarrhea over the past 2 days with associated generalized weakness.  He has a known history of spindle cell sarcoma for which he has intermittent need for blood transfusions secondary to slow GI blood loss.  He denies melena or hematochezia.  He feels generally weak.  He denies fevers and chills.  No productive cough.  No new shortness of breath.  Denies abdominal pain.   Past Medical History:  Diagnosis Date  . Anxiety   . Bladder cancer ([place]) 2011  . Borderline diabetes mellitus   . CAD (coronary artery disease)   . Cigarette smoker   . COPD (chronic obstructive pulmonary disease) ([place])   . DJD (degenerative joint disease)   . Emphysema of lung ([place])   . Epistaxis   . History of colonic polyps 2004   hyperplastic   . History of sinusitis   . Hypercholesteremia   . Hypertension   . Obesity     Patient Active Problem List   Diagnosis Date Noted  . SOB (shortness of breath)   . Elevated troponin   . Palliative care by specialist   . Goals of care, counseling/discussion   . Abdominal pain   . GI bleed 02/18/2019  . DNR (do not resuscitate) 12/21/2018  . Anemia 12/14/2018  . Liver metastasis ([place]) 07/13/2018  . Acute blood loss anemia 06/07/2018  . Rectal bleeding 05/13/2018  . Chronic diastolic CHF (congestive heart failure) ([place]) 05/13/2018  . Lower GI bleed 05/13/2018  . Class 2 obesity with body mass index (BMI) of 37.0 to 37.9 in adult 01/10/2018  . 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 Acute on chronic diastolic CHF (congestive heart failure)  ([place]) 01/19/2017  . Chronic respiratory failure with hypoxia and hypercapnia ([place]) 10/13/2016  . OSA treated with BiPAP 10/13/2016  . Macrocytic anemia 09/24/2016  . COPD with acute exacerbation ([place]) 09/24/2016  . Acute bronchitis 02/03/2016  . Ex-smoker 10/31/2015  . Dilated cardiomyopathy ([place])   . Pulmonary hypertension ([place])   . Acute on chronic respiratory failure with hypoxia and hypercapnia ([place]) 10/20/2015  . 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 LBP (low back pain) 03/24/2011  . Malignant neoplasm of bladder ([place]) 09/22/2010  . HEMATURIA UNSPECIFIED 05/14/2010  . Osteoarthritis 03/28/2010  . Coronary atherosclerosis of native coronary artery 12/19/2007  . Acute sinusitis 12/19/2007  . COPD mixed type ([place]) 12/19/2007  . COLONIC POLYPS 12/18/2007  . HYPERCHOLESTEROLEMIA 12/18/2007  . Anxiety 12/18/2007  . Essential hypertension 12/18/2007  . GASTRITIS 12/18/2007  . Impaired fasting glucose 12/18/2007    Past Surgical History:  Procedure Laterality Date  . cataract surgery  septemeber 2013  . cataract surgery/sub posterior vitrectomy in left eye  1996   Dr. Zadie Rhine  . PTCA  [phone]        Home Medications    Prior to Admission medications   Medication Sig Start Date End Date Taking? Authorizing Provider  acetaminophen (TYLENOL) 325 MG tablet Take 2 tablets (650 mg total) by mouth every 6 (six) hours as needed for mild  pain (or Fever >/= 101). 06/11/18   Mariel Aloe, MD  Albuterol Sulfate (PROAIR RESPICLICK) 786 (90 Base) MCG/ACT AEPB Inhale 2 puffs into the lungs every 6 (six) hours as needed (shortness of breath). 10/26/18   Noralee Space, MD  ALPRAZolam Duanne Moron) 0.5 MG tablet Take 1 tablet (0.5 mg total) by mouth at bedtime. 10/26/18   Noralee Space, MD  Ascorbic Acid (VITAMIN C) 1000 MG tablet Take 1,000 mg by mouth daily.    [provider]  budesonide (PULMICORT) 0.25 MG/2ML nebulizer solution Take 2 mLs (0.25 mg total) by  nebulization 2 (two) times daily. 02/12/19   Garner Nash, DO  cholecalciferol (VITAMIN D) 400 units TABS tablet Take 400 Units by mouth at bedtime.    [provider]  Ferrous Sulfate (IRON) 325 (65 Fe) MG TABS Take 325 mg by mouth daily.    [provider]  Flaxseed, Linseed, (FLAX SEEDS PO) Take 1 capsule by mouth daily.    [provider]  fluticasone (FLONASE) 50 MCG/ACT nasal spray Place 2 sprays into both nostrils daily as needed for allergies or rhinitis.  09/21/18   [provider]  furosemide (LASIX) 40 MG tablet Take 40 mg by mouth daily as needed for fluid.     [provider]  ipratropium-albuterol (DUONEB) 0.5-2.5 (3) MG/3ML SOLN Take 3 mLs by nebulization every 4 (four) hours as needed. Patient taking differently: Take 3 mLs by nebulization 4 (four) times daily.  02/12/19   Garner Nash, DO  isosorbide mononitrate (IMDUR) 30 MG 24 hr tablet Take 30 mg by mouth daily. 03/07/19   [provider]  methocarbamol (ROBAXIN) 500 MG tablet Take 1,000 mg by mouth at bedtime as needed for muscle spasms.  03/07/18   [provider]  multivitamin-lutein (OCUVITE-LUTEIN) CAPS capsule Take 1 capsule by mouth every evening.     [provider]  nitroGLYCERIN (NITROSTAT) 0.4 MG SL tablet Place 1 tablet (0.4 mg total) under the tongue every 5 (five) minutes as needed for chest pain. 11/19/15   Richardson Dopp T, PA-C  pantoprazole (PROTONIX) 40 MG tablet Take 1 tablet (40 mg total) by mouth daily. Patient not taking: Reported on 03/27/2019 05/18/18 12/14/18  Shelly Coss, MD  pantoprazole (PROTONIX) 40 MG tablet Take 40 mg by mouth daily. 03/03/19   [provider]  Polyethyl Glycol-Propyl Glycol (SYSTANE OP) Apply 1 drop to eye daily as needed (dry eyes).    [provider]  simvastatin (ZOCOR) 20 MG tablet Take 1 tablet (20 mg total) by mouth daily at 6 PM. 11/19/15   Kathlen Mody, Nicki Reaper T, PA-C  sodium chloride (OCEAN)  0.65 % SOLN nasal spray Place 1 spray into both nostrils as needed for congestion. Patient taking differently: Place 1 spray into both nostrils daily as needed for congestion.  10/29/15   Cherene Altes, MD  vitamin B-12 (CYANOCOBALAMIN) 1000 MCG tablet Take 1,000 mcg by mouth daily.    [provider]  VOTRIENT 200 MG tablet  02/05/19   [provider]    Family History Family History  Problem Relation Age of Onset  . Heart disease Mother   . Heart disease Brother   . Cancer Brother   . Aneurysm Brother     Social History Social History   Tobacco Use  . Smoking status: Former Smoker    Packs/day: 2.00    Years: 45.00    Pack years: 90.00    Types: Cigarettes  Last attempt to quit: 10/17/2015    Years since quitting: 3.4  . Smokeless tobacco: Never Used  Substance Use Topics  . Alcohol use: Yes    Alcohol/week: 0.0 standard drinks    Comment: glass of wine at night  . Drug use: No     Allergies   Amlodipine   Review of Systems Review of Systems  All other systems reviewed and are negative.    Physical Exam Updated Vital Signs BP (!) 140/125   Pulse (!) 136   Temp 98.3 F (36.8 C) (Oral)   Resp (!) 23   Ht 5\' 6"  (1.676 m)   Wt 99.3 kg   SpO2 99%   BMI 35.35 kg/m     Physical Exam Vitals signs and nursing note reviewed.  Constitutional:      Appearance: He is well-developed.  HENT:     Head: Normocephalic and atraumatic.  Neck:     Musculoskeletal: Normal range of motion.  Cardiovascular:     Rate and Rhythm: Normal rate and regular rhythm.     Heart sounds: Normal heart sounds.  Pulmonary:     Effort: Pulmonary effort is normal. No respiratory distress.     Breath sounds: Normal breath sounds.  Abdominal:     General: There is no distension.     Palpations: Abdomen is soft.     Tenderness: There is no abdominal tenderness.  Musculoskeletal: Normal range of motion.  Skin:    General: Skin is warm and dry.   Neurological:     Mental Status: He is alert and oriented to person, place, and time.  Psychiatric:        Judgment: Judgment normal.      ED Treatments / Results  Labs (all labs ordered are listed, but only abnormal results are displayed) Labs Reviewed  CBC - Abnormal; Notable for the following components:      Result Value   RBC 2.65 (*)    Hemoglobin 8.1 (*)    HCT 26.2 (*)    RDW 23.7 (*)    nRBC 0.6 (*)    All other components within normal limits  COMPREHENSIVE METABOLIC PANEL - Abnormal; Notable for the following components:   Glucose, Bld 134 (*)    Calcium 8.5 (*)    AST 43 (*)    Total Bilirubin 2.1 (*)    All other components within normal limits  SARS CORONAVIRUS 2 (HOSPITAL ORDER, [facility])  TYPE AND SCREEN    EKG None  Radiology Dg Chest Portable 1 View  Result Date: 03/27/2019 CLINICAL DATA:  Weakness with nausea and vomiting EXAM: PORTABLE CHEST 1 VIEW COMPARISON:  02/19/2019, CT from 02/17/2017 FINDINGS: Cardiac shadow is stable. Aortic calcifications are noted. The lungs are well aerated bilaterally and demonstrate multiple pulmonary nodules increased from the prior exam. Largest of these lies in the right upper lobe measuring 2.1 cm in greatest dimension. No acute bony abnormality is noted. IMPRESSION: Enlarging pulmonary nodules bilaterally. CT of the chest is recommended for further evaluation. Electronically Signed   By: Inez Catalina M.D.   On: 03/27/2019 12:48    Procedures Procedures (including critical care time)  Medications Ordered in ED Medications  sodium chloride flush (NS) 0.9 % injection 10-40 mL (10 mLs Intracatheter Given 03/27/19 1419)  sodium chloride flush (NS) 0.9 % injection 10-40 mL (has no administration in time range)  sodium chloride 0.9 % bolus 1,000 mL (1,000 mLs Intravenous New Bag/Given (Non-Interop) 03/27/19 1413)  ondansetron (ZOFRAN) injection 4 mg (4 mg Intravenous Given 03/27/19 1413)   albuterol (PROVENTIL) (2.5 MG/3ML) 0.083% nebulizer solution 5 mg (5 mg Nebulization Given 03/27/19 1417)  ipratropium (ATROVENT) nebulizer solution 0.5 mg (0.5 mg Nebulization Given 03/27/19 1417)     Initial Impression / Assessment and Plan / ED Course  I have reviewed the triage vital signs and the nursing notes.  Pertinent labs & imaging results that were available during my care of the patient were reviewed by me and considered in my medical decision making (see chart for details).        Patient with concerning chest x-ray with multiple enlarging pulmonary nodules.  Patient will need CT imaging to his chest abdomen and pelvis at this time.  I am concerned about the possibility of metastatic disease.  While in the emergency department he developed some shortness of breath while standing to urinate.  He feels like he needs a breathing treatment.  He will be given albuterol and Atrovent at this time.  With rest his breathing seems to be improving.  Care transferred to oncoming ED team to follow up on CT imaging   Final Clinical Impressions(s) / ED Diagnoses   Final diagnoses:  None    ED Discharge Orders    None       Jola Schmidt, MD 03/31/19 2122

## 2019-03-27 NOTE — ED Notes (Signed)
IV Team bedside

## 2019-03-27 NOTE — H&P (Signed)
History and Physical    [name] EPP:[id number] DOB: 1946/05/09 DOA: 03/27/2019  PCP: [name], MD Patient coming from: Home lives at home with his wife for 56 years  Chief Complaint: Abdominal pain and abdominal distention and generalized weakness  HPI: [name] is a 73 y.o. 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  He reports having a bowel movement this morning on the day of admission and moving flatus.  He denied any nausea or vomiting.  However he reported nausea vomiting and diarrhea to the nursing staff on Sunday which is a day prior to this admission.  He has no complaints of fever chills cough.  Patient had another hospital admission for rectal bleed last month.  Patient was nauseous in the ER. He has COPD and is on 6 L of oxygen 24/7.  He is aware his prognosis is guarded and he is agreeable to see palliative care and he is DO NOT RESUSCITATE.  ED Course: CT scan of the abdomen and pelvis done in the ER shows due to large right-sided tumor, large right-sided intra-abdominal tumor, catheter new pulmonary nodules measuring 2.6 cm in diameter compatible with metastatic disease several of the nodules are chronic but most of them are new reduction in size of the lateral segment of the left hepatic lobe lesion status post prior embolization new 2 cm ill-defined lesion in the segment 5 of the liver which may be a new metastatic lesion, markedly severe emphysema sigmoid colon diverticulosis.  Vital signs 12/12/1967 pulse is 110 respiration 17 saturation 97% on 4 L.  Review of Systems: As per HPI otherwise all other systems reviewed and are negative  Ambulatory Status: Ambulatory at baseline  Past Medical History:  Diagnosis  Date   Anxiety    Bladder cancer ([place]) 2011   Borderline diabetes mellitus    CAD (coronary artery disease)    Cigarette smoker    COPD (chronic obstructive pulmonary disease) (HCC)    DJD (degenerative joint disease)    Emphysema of lung (HCC)    Epistaxis    History of colonic polyps 2004   hyperplastic    History of sinusitis    Hypercholesteremia    Hypertension    Obesity     Past Surgical History:  Procedure Laterality Date   cataract surgery  septemeber 2013   cataract surgery/sub posterior vitrectomy in left eye  1996   Dr. Zadie Rhine   PTCA  [phone]    Social History   Socioeconomic History   Marital status: Married    Spouse name: [name]   Number of children: 2   Years of education: Not on file   Highest education level: Not on file  Occupational History   Occupation: Retired  Scientist, product/process development strain: Not on file   Food insecurity:    Worry: Not on file    Inability: Not on file   Transportation needs:    Medical: Not on file    Non-medical: Not on file  Tobacco Use   Smoking status: Former Smoker    Packs/day: 2.00    Years: 45.00    Pack years: 90.00    Types: Cigarettes    Last attempt to quit: 10/17/2015    Years since quitting: 3.4   Smokeless  tobacco: Never Used  Substance and Sexual Activity   Alcohol use: Yes    Alcohol/week: 0.0 standard drinks    Comment: glass of wine at night   Drug use: No   Sexual activity: Not on file  Lifestyle   Physical activity:    Days per week: Not on file    Minutes per session: Not on file   Stress: Not on file  Relationships   Social connections:    Talks on phone: Not on file    Gets together: Not on file    Attends religious service: Not on file    Active member of club or organization: Not on file    Attends meetings of clubs or organizations: Not on file    Relationship status: Not on file   Intimate partner violence:    Fear of current or  ex partner: Not on file    Emotionally abused: Not on file    Physically abused: Not on file    Forced sexual activity: Not on file  Other Topics Concern   Not on file  Social History Narrative   Not on file    Allergies  Allergen Reactions   Amlodipine Swelling    Family History  Problem Relation Age of Onset   Heart disease Mother    Heart disease Brother    Cancer Brother    Aneurysm Brother     Prior to Admission medications   Medication Sig Start Date End Date Taking? Authorizing Provider  ALPRAZolam Duanne Moron) 0.5 MG tablet Take 1 tablet (0.5 mg total) by mouth at bedtime. 10/26/18  Yes Noralee Space, MD  Ascorbic Acid (VITAMIN C) 1000 MG tablet Take 1,000 mg by mouth daily.   Yes [provider]  budesonide (PULMICORT) 0.25 MG/2ML nebulizer solution Take 2 mLs (0.25 mg total) by nebulization 2 (two) times daily. 02/12/19  Yes Icard, Leory Plowman L, DO  cholecalciferol (VITAMIN D) 400 units TABS tablet Take 400 Units by mouth at bedtime.   Yes [provider]  Ferrous Sulfate (IRON) 325 (65 Fe) MG TABS Take 325 mg by mouth daily.   Yes [provider]  Flaxseed, Linseed, (FLAX SEEDS PO) Take 1 capsule by mouth daily.   Yes [provider]  fluticasone (FLONASE) 50 MCG/ACT nasal spray Place 2 sprays into both nostrils daily as needed for allergies or rhinitis.  09/21/18  Yes [provider]  furosemide (LASIX) 40 MG tablet Take 40 mg by mouth daily as needed for fluid.    Yes [provider]  ipratropium-albuterol (DUONEB) 0.5-2.5 (3) MG/3ML SOLN Take 3 mLs by nebulization every 4 (four) hours as needed. Patient taking differently: Take 3 mLs by nebulization 4 (four) times daily.  02/12/19  Yes Icard, Bradley L, DO  lisinopril (ZESTRIL) 10 MG tablet Take 10 mg by mouth daily.   Yes [provider]  methocarbamol (ROBAXIN) 500 MG tablet Take 1,000 mg by mouth at bedtime.  03/07/18  Yes [provider]    multivitamin-lutein (OCUVITE-LUTEIN) CAPS capsule Take 1 capsule by mouth 2 (two) times a day.    Yes [provider]  pantoprazole (PROTONIX) 40 MG tablet Take 40 mg by mouth daily. 03/03/19  Yes [provider]  simvastatin (ZOCOR) 20 MG tablet Take 1 tablet (20 mg total) by mouth daily at 6 PM. 11/19/15  Yes Weaver, Nicki Reaper T, PA-C  vitamin B-12 (CYANOCOBALAMIN) 1000 MCG tablet Take 1,000 mcg by mouth daily.   Yes [provider]  acetaminophen (TYLENOL) 325 MG tablet Take 2 tablets (650 mg total) by mouth every 6 (six) hours as needed for mild pain (or Fever >/= 101). Patient not taking: Reported on 03/27/2019 06/11/18   Mariel Aloe, MD  Albuterol Sulfate (PROAIR RESPICLICK) 130 (90 Base) MCG/ACT AEPB Inhale 2 puffs into the lungs every 6 (six) hours as needed (shortness of breath). 10/26/18   Noralee Space, MD  nitroGLYCERIN (NITROSTAT) 0.4 MG SL tablet Place 1 tablet (0.4 mg total) under the tongue every 5 (five) minutes as needed for chest pain. 11/19/15   Richardson Dopp T, PA-C  pantoprazole (PROTONIX) 40 MG tablet Take 1 tablet (40 mg total) by mouth daily. Patient not taking: Reported on 03/27/2019 05/18/18 12/14/18  Shelly Coss, MD  Polyethyl Glycol-Propyl Glycol (SYSTANE OP) Apply 1 drop to eye daily as needed (dry eyes).    [provider]  sodium chloride (OCEAN) 0.65 % SOLN nasal spray Place 1 spray into both nostrils as needed for congestion. Patient taking differently: Place 1 spray into both nostrils daily as needed for congestion.  10/29/15   Cherene Altes, MD  VOTRIENT 200 MG tablet  02/05/19   [provider]    Physical Exam: Vitals:   03/27/19 1541 03/27/19 1600 03/27/19 1630 03/27/19 1700  BP: (!) 156/89 (!) 113/48 (!) 107/55 128/68  Pulse: (!) 139 (!) 109 (!) 126 (!) 110  Resp: (!) 28 20 (!) 26 17  Temp:      TempSrc:      SpO2: 99% 96% 98% 97%  Weight:      Height:          General:  Appears calm and  comfortable  Eyes:  PERRL, EOMI, normal lids, iris  ENT:  grossly normal hearing, lips & tongue, mmm  Neck:  no LAD, masses or thyromegaly  Cardiovascular:  RRR, no m/r/g. No LE edema.   Respiratory: CTA bilaterally, no w/r/r. Normal respiratory effort.  Abdomen: Distended soft large mass right mid and right lower quadrant of the abdomen felt nontender no rebound or guarding bowel sounds present  Skin:  no rash or induration seen on limited exam  Musculoskeletal:  grossly normal tone BUE/BLE, good ROM, no bony abnormality  Psychiatric: grossly normal mood and affect, speech fluent and appropriate, AOx3  Neurologic:  CN 2-12 grossly intact, moves all extremities in coordinated fashion, sensation intact  Labs on Admission: I have personally reviewed following labs and imaging studies  CBC: Recent Labs  Lab 03/27/19 1225  WBC 5.1  HGB 8.1*  HCT 26.2*  MCV 98.9  PLT 865   Basic Metabolic Panel: Recent Labs  Lab 03/27/19 1225  NA 135  K 4.9  CL 103  CO2 24  GLUCOSE 134*  BUN 11  CREATININE 0.76  CALCIUM 8.5*   GFR: Estimated Creatinine Clearance: 90.7 mL/min (by C-G formula based on SCr of 0.76 mg/dL). Liver Function Tests: Recent Labs  Lab 03/27/19 1225  AST 43*  ALT 31  ALKPHOS 66  BILITOT 2.1*  PROT 6.8  ALBUMIN 3.5   No results for input(s): LIPASE, AMYLASE in the last 168 hours. No results for input(s): AMMONIA in the last 168 hours. Coagulation Profile: No results for input(s): INR, PROTIME in the last 168 hours. Cardiac Enzymes: No results for input(s): CKTOTAL, CKMB, CKMBINDEX, TROPONINI in the last 168 hours. BNP (last 3 results) No results for input(s): PROBNP in the last 8760 hours. HbA1C: No results for input(s): HGBA1C in the last 72 hours.  CBG: No results for input(s): GLUCAP in the last 168 hours. Lipid Profile: No results for input(s): CHOL, HDL, LDLCALC, TRIG, CHOLHDL, LDLDIRECT in the last 72 hours. Thyroid Function Tests: No  results for input(s): TSH, T4TOTAL, FREET4, T3FREE, THYROIDAB in the last 72 hours. Anemia Panel: No results for input(s): VITAMINB12, FOLATE, FERRITIN, TIBC, IRON, RETICCTPCT in the last 72 hours. Urine analysis:    Component Value Date/Time   COLORURINE YELLOW 02/18/2019 1319   APPEARANCEUR CLEAR 02/18/2019 1319   LABSPEC 1.020 02/18/2019 1319   PHURINE 5.0 02/18/2019 1319   GLUCOSEU NEGATIVE 02/18/2019 1319   GLUCOSEU NEGATIVE 06/30/2010 1050   HGBUR NEGATIVE 02/18/2019 1319   BILIRUBINUR NEGATIVE 02/18/2019 1319   KETONESUR 5 (A) 02/18/2019 1319   PROTEINUR NEGATIVE 02/18/2019 1319   UROBILINOGEN 0.2 06/30/2010 1050   NITRITE NEGATIVE 02/18/2019 1319   LEUKOCYTESUR NEGATIVE 02/18/2019 1319    Creatinine Clearance: Estimated Creatinine Clearance: 90.7 mL/min (by C-G formula based on SCr of 0.76 mg/dL).  Sepsis Labs: @LABRCNTIP (procalcitonin:4,lacticidven:4) ) Recent Results (from the past 240 hour(s))  SARS Coronavirus 2 (CEPHEID - Performed in [place] hospital lab), Hosp Order     Status: None   Collection Time: 03/27/19 12:25 PM  Result Value Ref Range Status   SARS Coronavirus 2 NEGATIVE NEGATIVE Final    Comment: (NOTE) If result is NEGATIVE SARS-CoV-2 target nucleic acids are NOT DETECTED. The SARS-CoV-2 RNA is generally detectable in upper and lower  respiratory specimens during the acute phase of infection. The lowest  concentration of SARS-CoV-2 viral copies this assay can detect is 250  copies / mL. A negative result does not preclude SARS-CoV-2 infection  and should not be used as the sole basis for treatment or other  patient management decisions.  A negative result may occur with  improper specimen collection / handling, submission of specimen other  than nasopharyngeal swab, presence of viral mutation(s) within the  areas targeted by this assay, and inadequate number of viral copies  (<250 copies / mL). A negative result must be combined with clinical   observations, patient history, and epidemiological information. If result is POSITIVE SARS-CoV-2 target nucleic acids are DETECTED. The SARS-CoV-2 RNA is generally detectable in upper and lower  respiratory specimens dur ing the acute phase of infection.  Positive  results are indicative of active infection with SARS-CoV-2.  Clinical  correlation with patient history and other diagnostic information is  necessary to determine patient infection status.  Positive results do  not rule out bacterial infection or co-infection with other viruses. If result is PRESUMPTIVE POSTIVE SARS-CoV-2 nucleic acids MAY BE PRESENT.   A presumptive positive result was obtained on the submitted specimen  and confirmed on repeat testing.  While 2019 novel coronavirus  (SARS-CoV-2) nucleic acids may be present in the submitted sample  additional confirmatory testing may be necessary for epidemiological  and / or clinical management purposes  to differentiate between  SARS-CoV-2 and other Sarbecovirus currently known to infect humans.  If clinically indicated additional testing with an alternate test  methodology [phone]) is advised. The SARS-CoV-2 RNA is generally  detectable in upper and lower respiratory sp ecimens during the acute  phase of infection. The expected result is Negative. Fact Sheet for Patients:  StrictlyIdeas.no Fact Sheet for Healthcare Providers: BankingDealers.co.za This test is not yet approved or cleared by the [place] FDA and has been authorized for detection and/or diagnosis of SARS-CoV-2 by FDA under an Emergency Use Authorization (EUA).  This EUA will remain  in effect (meaning this test can be used) for the duration of the COVID-19 declaration under Section 564(b)(1) of the Act, 21 U.S.C. section 360bbb-3(b)(1), unless the authorization is terminated or revoked sooner. Performed at [facility], [place]  [address], [place], [place] [address]      Radiological Exams on Admission: Ct Chest W Contrast  Result Date: 03/27/2019 CLINICAL DATA:  Abdominal spindle cell sarcoma with known hepatic metastatic disease. Enlarging pulmonary nodules on recent chest radiograph. Vomiting and diarrhea. Weakness. Home oxygen for COPD. Tachycardia. EXAM: CT CHEST, ABDOMEN, AND PELVIS WITH CONTRAST TECHNIQUE: Multidetector CT imaging of the chest, abdomen and pelvis was performed following the standard protocol during bolus administration of intravenous contrast. CONTRAST:  124mL OMNIPAQUE IOHEXOL 300 MG/ML  SOLN COMPARISON:  Multiple exams, including 12/14/2018, 02/17/2017, and chest radiograph from 03/27/2019 FINDINGS: CT CHEST FINDINGS Cardiovascular: Coronary, aortic arch, and branch vessel atherosclerotic vascular disease. Mediastinum/Nodes: Unremarkable Lungs/Pleura: Scattered pulmonary nodules in the lungs are mostly new compared to 02/17/2017 although several are stable. A new right upper lobe nodule on image 45/6 measures 1.5 by 1.5 cm. A new right lower lobe nodule on image 115/6 measures 2.6 by 2.1 cm. A new left lower lobe nodule on image 70/6 measures 1.8 by 1.5 cm. Multiple additional new nodules are present. Markedly severe emphysema. Musculoskeletal: Lower thoracic spondylosis. CT ABDOMEN PELVIS FINDINGS Hepatobiliary: Prior chemoembolization of a left hepatic lobe lesion with coils noted and a distal left hepatic artery branch, and with a cystic and rim calcified treated lesion measuring 4.8 by 3.7 cm on image 56/2, formerly 5.4 by 4.1 cm on 12/14/2018. Medially in segment 2 of the liver, a 3.7 by 2.8 cm hypodense lesion on image 49/2 was previously 4.8 by 3.9 cm by my measurements, a significant improvement. 1.0 cm gallstone in the gallbladder. Ill-defined potential 2.0 by 1.2 cm lesion in segment 5 of the liver on image 65/2, not well appreciated on prior CT exams, and only vaguely seen today. In the dome of the  right hepatic lobe there is a suggestion of a 1.0 cm hyperdense lesion on image 51/2 which could be a small hemangioma but which is technically nonspecific. Pancreas: Unremarkable Spleen: Unremarkable Adrenals/Urinary Tract: 6.6 cm in diameter right mid kidney fluid density cyst. Adrenal glands normal. Urinary bladder unremarkable. Stomach/Bowel: Small-bowel obstruction with dilated and thick-walled loops of small bowel up to 4.4 cm in diameter. Mild associated mesenteric edema along the involved loops. The transition point is along the anterior margin of the large right abdominal tumor which is at least effacing and extrinsically compressing the involved small bowel loop, and invasion of the loop is not excluded. Sigmoid colon diverticulosis. Vascular/Lymphatic: Aortoiliac atherosclerotic vascular disease. Reproductive: Unremarkable Other: Eccentric to the right in the abdomen, the large abdominal mass currently measures 24.0 by 16.0 by 18.5 cm (volume = 3720 cm^3). There is adjacent ascites in the right lower quadrant, nonspecific for malignant versus bland ascites. Musculoskeletal: Spondylosis and degenerative disc disease at L5-S1 causing bilateral foraminal impingement. IMPRESSION: 1. Small-bowel obstruction due to the large right-sided tumor. Dilated thick-walled loops of small bowel extend to a loop which runs anterior to the tumor. This loop is severely effaced and likely compressed by the tumor, and invasion is not excluded. 2. Large right-sided intra-abdominal tumor currently measures at 3720 cubic cm, previously 3110 cubic cm on 12/14/2018. 3. Compared to the most recent chest CT from 02/17/2017, there are various scattered new pulmonary nodules measuring up to about 2.6 cm in  diameter, compatible with metastatic disease. Several of the pulmonary nodules are chronic but most are new. 4. Further reduction in size of the lateral segment left hepatic lobe lesions, status post prior embolization. However,  there is a questionable new 2.0 cm ill-defined lesion in segment 5 of the liver which could be a new metastatic lesion. 5. Markedly severe emphysema.  Emphysema (ICD10-J43.9). 6.  Aortic Atherosclerosis (ICD10-I70.0).  Coronary atherosclerosis. 7. Lumbar impingement L5-S1. 8. Sigmoid colon diverticulosis. Electronically Signed   By: Van Clines M.D.   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 Enlarging pulmonary nodules on recent chest radiograph. Vomiting and diarrhea. Weakness. Home oxygen for COPD. Tachycardia. EXAM: CT CHEST, ABDOMEN, AND PELVIS WITH CONTRAST TECHNIQUE: Multidetector CT imaging of the chest, abdomen and pelvis was performed following the standard protocol during bolus administration of intravenous contrast. CONTRAST:  147mL OMNIPAQUE IOHEXOL 300 MG/ML  SOLN COMPARISON:  Multiple exams, including 12/14/2018, 02/17/2017, and chest radiograph from 03/27/2019 FINDINGS: CT CHEST FINDINGS Cardiovascular: Coronary, aortic arch, and branch vessel atherosclerotic vascular disease. Mediastinum/Nodes: Unremarkable Lungs/Pleura: Scattered pulmonary nodules in the lungs are mostly new compared to 02/17/2017 although several are stable. A new right upper lobe nodule on image 45/6 measures 1.5 by 1.5 cm. A new right lower lobe nodule on image 115/6 measures 2.6 by 2.1 cm. A new left lower lobe nodule on image 70/6 measures 1.8 by 1.5 cm. Multiple additional new nodules are present. Markedly severe emphysema. Musculoskeletal: Lower thoracic spondylosis. CT ABDOMEN PELVIS FINDINGS Hepatobiliary: Prior chemoembolization of a left hepatic lobe lesion with coils noted and a distal left hepatic artery branch, and with a cystic and rim calcified treated lesion measuring 4.8 by 3.7 cm on image 56/2, formerly 5.4 by 4.1 cm on 12/14/2018. Medially in segment 2 of the liver, a 3.7 by 2.8 cm  hypodense lesion on image 49/2 was previously 4.8 by 3.9 cm by my measurements, a significant improvement. 1.0 cm gallstone in the gallbladder. Ill-defined potential 2.0 by 1.2 cm lesion in segment 5 of the liver on image 65/2, not well appreciated on prior CT exams, and only vaguely seen today. In the dome of the right hepatic lobe there is a suggestion of a 1.0 cm hyperdense lesion on image 51/2 which could be a small hemangioma but which is technically nonspecific. Pancreas: Unremarkable Spleen: Unremarkable Adrenals/Urinary Tract: 6.6 cm in diameter right mid kidney fluid density cyst. Adrenal glands normal. Urinary bladder unremarkable. Stomach/Bowel: Small-bowel obstruction with dilated and thick-walled loops of small bowel up to 4.4 cm in diameter. Mild associated mesenteric edema along the involved loops. The transition point is along the anterior margin of the large right abdominal tumor which is at least effacing and extrinsically compressing the involved small bowel loop, and invasion of the loop is not excluded. Sigmoid colon diverticulosis. Vascular/Lymphatic: Aortoiliac atherosclerotic vascular disease. Reproductive: Unremarkable Other: Eccentric to the right in the abdomen, the large abdominal mass currently measures 24.0 by 16.0 by 18.5 cm (volume = 3720 cm^3). There is adjacent ascites in the right lower quadrant, nonspecific for malignant versus bland ascites. Musculoskeletal: Spondylosis and degenerative disc disease at L5-S1 causing bilateral foraminal impingement. IMPRESSION: 1. Small-bowel obstruction due to the large right-sided tumor. Dilated thick-walled loops of small bowel extend to a loop which runs anterior to the tumor. This loop is severely effaced and likely compressed by the tumor, and invasion is not excluded.  2. Large right-sided intra-abdominal tumor currently measures at 3720 cubic cm, previously 3110 cubic cm on 12/14/2018. 3. Compared to the most recent chest CT from  02/17/2017, there are various scattered new pulmonary nodules measuring up to about 2.6 cm in diameter, compatible with metastatic disease. Several of the pulmonary nodules are chronic but most are new. 4. Further reduction in size of the lateral segment left hepatic lobe lesions, status post prior embolization. However, there is a questionable new 2.0 cm ill-defined lesion in segment 5 of the liver which could be a new metastatic lesion. 5. Markedly severe emphysema.  Emphysema (ICD10-J43.9). 6.  Aortic Atherosclerosis (ICD10-I70.0).  Coronary atherosclerosis. 7. Lumbar impingement L5-S1. 8. Sigmoid colon diverticulosis. Electronically Signed   By: Van Clines M.D.   On: 03/27/2019 16:18   Dg Chest Portable 1 View  Result Date: 03/27/2019 CLINICAL DATA:  Weakness with nausea and vomiting EXAM: PORTABLE CHEST 1 VIEW COMPARISON:  02/19/2019, CT from 02/17/2017 FINDINGS: Cardiac shadow is stable. Aortic calcifications are noted. The lungs are well aerated bilaterally and demonstrate multiple pulmonary nodules increased from the prior exam. Largest of these lies in the right upper lobe measuring 2.1 cm in greatest dimension. No acute bony abnormality is noted. IMPRESSION: Enlarging pulmonary nodules bilaterally. CT of the chest is recommended for further evaluation. Electronically Signed   By: Inez Catalina M.D.   On: 03/27/2019 12:48     Assessment/Plan Active Problems:   * No active hospital problems. *    #1 small bowel obstruction due to compression of intra-abdominal metastatic tumor from spindle cell sarcoma.  Patient had a bowel movement this morning and passed gas today.  So far he has not had any vomiting in the ER however he is nauseous.  ED physician has put in a call to general surgery.  I will place him on Zofran.  Patient takes Lasix at home which I will hold.  Upon discussion with patient and wife they do not want any aggressive treatment not even NG tube if it comes to that.  #2  metastatic spindle cell sarcoma with new mets pulmonary nodules as well as may be an nodule in the liver along with mets to the bladder.  Consult palliative care.  #3 end-stage COPD on 6 L of oxygen 24/7 with the BiPAP 15/7 at home.  Continue Pulmicort..  #4 anemia of chronic disease hemoglobin 8.1 down from 9.3 last month monitor.    Estimated body mass index is 35.35 kg/m as calculated from the following:   Height as of this encounter: 5\' 6"  (1.676 m).   Weight as of this encounter: 99.3 kg.   DVT prophylaxis: Heparin Code Status: DO NOT RESUSCITATE Family Communication: Discussed with wife [name] [phone] Disposition Plan: pending clinical improvement  Consults called:edp called gen surgery Admission status:in patient    Georgette Shell MD [facility]  If 7PM-7AM, please contact night-coverage [email or web address] Password [facility]  03/27/2019, 5:22 PM

## 2019-03-27 NOTE — ED Notes (Signed)
Bed: WA06 Expected date:  Expected time:  Means of arrival:  Comments: Patient in room

## 2019-03-27 NOTE — ED Notes (Signed)
x2 unsuccessful attempts to get IV

## 2019-03-28 DIAGNOSIS — E8770 Fluid overload, unspecified: Secondary | ICD-10-CM | POA: Diagnosis present

## 2019-03-28 DIAGNOSIS — J9611 Chronic respiratory failure with hypoxia: Secondary | ICD-10-CM | POA: Diagnosis present

## 2019-03-28 DIAGNOSIS — I10 Essential (primary) hypertension: Secondary | ICD-10-CM | POA: Diagnosis present

## 2019-03-28 DIAGNOSIS — E785 Hyperlipidemia, unspecified: Secondary | ICD-10-CM | POA: Diagnosis present

## 2019-03-28 DIAGNOSIS — D638 Anemia in other chronic diseases classified elsewhere: Secondary | ICD-10-CM

## 2019-03-28 DIAGNOSIS — I2510 Atherosclerotic heart disease of native coronary artery without angina pectoris: Secondary | ICD-10-CM | POA: Diagnosis present

## 2019-03-28 DIAGNOSIS — Z66 Do not resuscitate: Secondary | ICD-10-CM | POA: Diagnosis present

## 2019-03-28 DIAGNOSIS — F419 Anxiety disorder, unspecified: Secondary | ICD-10-CM | POA: Diagnosis present

## 2019-03-28 DIAGNOSIS — R531 Weakness: Secondary | ICD-10-CM | POA: Diagnosis not present

## 2019-03-28 DIAGNOSIS — K56609 Unspecified intestinal obstruction, unspecified as to partial versus complete obstruction: Secondary | ICD-10-CM | POA: Diagnosis not present

## 2019-03-28 DIAGNOSIS — G4733 Obstructive sleep apnea (adult) (pediatric): Secondary | ICD-10-CM

## 2019-03-28 DIAGNOSIS — Z1159 Encounter for screening for other viral diseases: Secondary | ICD-10-CM | POA: Diagnosis not present

## 2019-03-28 DIAGNOSIS — C7911 Secondary malignant neoplasm of bladder: Secondary | ICD-10-CM | POA: Diagnosis present

## 2019-03-28 DIAGNOSIS — C7989 Secondary malignant neoplasm of other specified sites: Secondary | ICD-10-CM | POA: Diagnosis present

## 2019-03-28 DIAGNOSIS — Z515 Encounter for palliative care: Secondary | ICD-10-CM

## 2019-03-28 DIAGNOSIS — I420 Dilated cardiomyopathy: Secondary | ICD-10-CM | POA: Diagnosis present

## 2019-03-28 DIAGNOSIS — I700 Atherosclerosis of aorta: Secondary | ICD-10-CM | POA: Diagnosis present

## 2019-03-28 DIAGNOSIS — C787 Secondary malignant neoplasm of liver and intrahepatic bile duct: Secondary | ICD-10-CM | POA: Diagnosis present

## 2019-03-28 DIAGNOSIS — C801 Malignant (primary) neoplasm, unspecified: Secondary | ICD-10-CM | POA: Diagnosis not present

## 2019-03-28 DIAGNOSIS — R918 Other nonspecific abnormal finding of lung field: Secondary | ICD-10-CM | POA: Diagnosis present

## 2019-03-28 DIAGNOSIS — R7989 Other specified abnormal findings of blood chemistry: Secondary | ICD-10-CM | POA: Diagnosis present

## 2019-03-28 DIAGNOSIS — K5730 Diverticulosis of large intestine without perforation or abscess without bleeding: Secondary | ICD-10-CM | POA: Diagnosis present

## 2019-03-28 DIAGNOSIS — J449 Chronic obstructive pulmonary disease, unspecified: Secondary | ICD-10-CM

## 2019-03-28 DIAGNOSIS — K56600 Partial intestinal obstruction, unspecified as to cause: Secondary | ICD-10-CM | POA: Diagnosis present

## 2019-03-28 DIAGNOSIS — I272 Pulmonary hypertension, unspecified: Secondary | ICD-10-CM | POA: Diagnosis present

## 2019-03-28 DIAGNOSIS — E669 Obesity, unspecified: Secondary | ICD-10-CM | POA: Diagnosis present

## 2019-03-28 DIAGNOSIS — R1900 Intra-abdominal and pelvic swelling, mass and lump, unspecified site: Secondary | ICD-10-CM | POA: Diagnosis present

## 2019-03-28 LAB — CBC
HCT: 22.1 % — ABNORMAL LOW (ref 39.0–52.0)
Hemoglobin: 6.8 g/dL — CL (ref 13.0–17.0)
MCH: 30.2 pg (ref 26.0–34.0)
MCHC: 30.8 g/dL (ref 30.0–36.0)
MCV: 98.2 fL (ref 80.0–100.0)
Platelets: 155 10*3/uL (ref 150–400)
RBC: 2.25 MIL/uL — ABNORMAL LOW (ref 4.22–5.81)
RDW: 23.9 % — ABNORMAL HIGH (ref 11.5–15.5)
WBC: 4.1 10*3/uL (ref 4.0–10.5)
nRBC: 0.5 % — ABNORMAL HIGH (ref 0.0–0.2)

## 2019-03-28 LAB — COMPREHENSIVE METABOLIC PANEL
ALT: 27 U/L (ref 0–44)
AST: 29 U/L (ref 15–41)
Albumin: 3 g/dL — ABNORMAL LOW (ref 3.5–5.0)
Alkaline Phosphatase: 57 U/L (ref 38–126)
Anion gap: 7 (ref 5–15)
BUN: 10 mg/dL (ref 8–23)
CO2: 25 mmol/L (ref 22–32)
Calcium: 8.1 mg/dL — ABNORMAL LOW (ref 8.9–10.3)
Chloride: 104 mmol/L (ref 98–111)
Creatinine, Ser: 0.83 mg/dL (ref 0.61–1.24)
GFR calc Af Amer: >60 mL/min (ref >60)
GFR calc non Af Amer: >60 mL/min (ref >60)
Glucose, Bld: 128 mg/dL — ABNORMAL HIGH (ref 70–99)
Potassium: 3.9 mmol/L (ref 3.5–5.1)
Sodium: 136 mmol/L (ref 135–145)
Total Bilirubin: 1.9 mg/dL — ABNORMAL HIGH (ref 0.3–1.2)
Total Protein: 5.8 g/dL — ABNORMAL LOW (ref 6.5–8.1)

## 2019-03-28 LAB — HEMOGLOBIN AND HEMATOCRIT, BLOOD
HCT: 25.9 % — ABNORMAL LOW (ref 39.0–52.0)
Hemoglobin: 8.2 g/dL — ABNORMAL LOW (ref 13.0–17.0)

## 2019-03-28 LAB — PREPARE RBC (CROSSMATCH)

## 2019-03-28 MED ORDER — TRAMADOL HCL 50 MG PO TABS
50.0000 mg | ORAL_TABLET | Freq: Four times a day (QID) | ORAL | Status: DC | PRN
  Administered 2019-03-29 (×2): 50 mg via ORAL
  Filled 2019-03-28 (×2): qty 1

## 2019-03-28 MED ORDER — IPRATROPIUM BROMIDE 0.02 % IN SOLN: 0.5000 mg | RESPIRATORY_TRACT | Status: DC | PRN

## 2019-03-28 MED ORDER — PANTOPRAZOLE SODIUM 40 MG PO TBEC: 40.0000 mg | DELAYED_RELEASE_TABLET | Freq: Every day | ORAL | Status: DC

## 2019-03-28 MED ORDER — ACETAMINOPHEN 325 MG PO TABS
650.0000 mg | ORAL_TABLET | Freq: Once | ORAL | Status: AC
  Administered 2019-03-28: 13:00:00 650 mg via ORAL
  Filled 2019-03-28: qty 2

## 2019-03-28 MED ORDER — DIPHENHYDRAMINE HCL 25 MG PO CAPS
25.0000 mg | ORAL_CAPSULE | Freq: Once | ORAL | Status: AC
  Administered 2019-03-28: 25 mg via ORAL
  Filled 2019-03-28: qty 1

## 2019-03-28 MED ORDER — IPRATROPIUM-ALBUTEROL 0.5-2.5 (3) MG/3ML IN SOLN
3.0000 mL | RESPIRATORY_TRACT | Status: DC
  Administered 2019-03-28: 3 mL via RESPIRATORY_TRACT

## 2019-03-28 MED ORDER — BUDESONIDE 0.5 MG/2ML IN SUSP
0.5000 mg | Freq: Two times a day (BID) | RESPIRATORY_TRACT | Status: DC
  Administered 2019-03-28 – 2019-03-30 (×4): 0.5 mg via RESPIRATORY_TRACT
  Filled 2019-03-28 (×3): qty 2

## 2019-03-28 MED ORDER — SODIUM CHLORIDE 0.9% IV SOLUTION: Freq: Once | INTRAVENOUS | Status: DC

## 2019-03-28 MED ORDER — IPRATROPIUM-ALBUTEROL 0.5-2.5 (3) MG/3ML IN SOLN
3.0000 mL | RESPIRATORY_TRACT | Status: DC | PRN
  Filled 2019-03-28: qty 3

## 2019-03-28 MED ORDER — IPRATROPIUM BROMIDE 0.02 % IN SOLN
0.5000 mg | Freq: Three times a day (TID) | RESPIRATORY_TRACT | Status: DC
  Filled 2019-03-28: qty 2.5

## 2019-03-28 MED ORDER — LEVALBUTEROL HCL 0.63 MG/3ML IN NEBU
0.6300 mg | INHALATION_SOLUTION | Freq: Three times a day (TID) | RESPIRATORY_TRACT | Status: DC
  Filled 2019-03-28 (×2): qty 3

## 2019-03-28 MED ORDER — PANTOPRAZOLE SODIUM 40 MG PO TBEC
40.0000 mg | DELAYED_RELEASE_TABLET | Freq: Every day | ORAL | Status: DC
  Administered 2019-03-28 – 2019-03-30 (×3): 40 mg via ORAL
  Filled 2019-03-28 (×3): qty 1

## 2019-03-28 MED ORDER — ALPRAZOLAM 0.5 MG PO TABS
0.5000 mg | ORAL_TABLET | Freq: Two times a day (BID) | ORAL | Status: DC | PRN
  Administered 2019-03-28 – 2019-03-29 (×2): 0.5 mg via ORAL
  Filled 2019-03-28 (×2): qty 1

## 2019-03-28 MED ORDER — BUDESONIDE 0.25 MG/2ML IN SUSP
0.5000 mg | Freq: Two times a day (BID) | RESPIRATORY_TRACT | Status: DC
  Filled 2019-03-28: qty 4

## 2019-03-28 MED ORDER — METHOCARBAMOL 500 MG PO TABS
1000.0000 mg | ORAL_TABLET | Freq: Every day | ORAL | Status: DC
  Administered 2019-03-28 – 2019-03-29 (×2): 1000 mg via ORAL
  Filled 2019-03-28 (×2): qty 2

## 2019-03-28 MED ORDER — IPRATROPIUM-ALBUTEROL 0.5-2.5 (3) MG/3ML IN SOLN
3.0000 mL | Freq: Four times a day (QID) | RESPIRATORY_TRACT | Status: DC
  Administered 2019-03-28 – 2019-03-30 (×8): 3 mL via RESPIRATORY_TRACT
  Filled 2019-03-28 (×8): qty 3

## 2019-03-28 MED ORDER — LEVALBUTEROL HCL 0.63 MG/3ML IN NEBU
0.6300 mg | INHALATION_SOLUTION | RESPIRATORY_TRACT | Status: DC | PRN
  Filled 2019-03-28: qty 3

## 2019-03-28 NOTE — Consult Note (Signed)
Consultation Note Date: 03/28/2019   Patient Name: [name]  DOB: 02-04-1946  MRN: [id number]  Age / Sex: 73 y.o., male  PCP: [name], MD Referring Physician: Eugenie Filler, MD  Reason for Consultation: Establishing goals of care  HPI/Patient Profile: 73 y.o. male  with past medical history of metastatic spindle cell carcinoma diagnosed in 2017, COPD on 6L home oxygen, OSA on HS BiPAP, CAD, HLD, HTN, rectal bleeding admitted on 03/27/2019 with abdominal pain/distention and weakness. CT ab/pelvis reveals large right-sided intra-abdominal tumor, small bowel obstruction due to tumor severely effaced/likely compressed by tumor and invasion not excluded, various new pulmonary nodules, and severe emphysema. Surgery consulted. Patient declines surgical interventions. Clear liquid diet initiated. Followed by Dr. Merrie Roof with [facility]. Palliative medicine consultation for goals of care.   Clinical Assessment and Goals of Care:  I have reviewed medical records, discussed with care team and met with patient at bedside to discuss diagnosis, prognosis, GOC, EOL wishes, disposition and options. Due to Covid-19 visitor restrictions, wife unable to be present at bedside.   Patient awake, alert, oriented and able to participate in [place] discussion. He denies abdominal pain/discomfort and nausea/vomiting.  Introduced Palliative Medicine as specialized medical care for people living with serious illness. It focuses on providing relief from the symptoms and stress of a serious illness. The goal is to improve quality of life for both the patient and the family. Patient seen by PMT on previous admissions. Notes reviewed.    Patient shares that he is followed by outpatient palliative services and is appreciative of their ongoing support.   Reviewed patient chronic health conditions including history of COPD on home  oxygen at 6L. He wears BiPAP at night. Reviewed oncology history since diagnosis of spindle cell carcinoma in 2017. States the tumor was "the size of a watermelon" when diagnosed. Followed by [facility] Dr. Merrie Roof. Completed 25 cycles of radiation. Chemotherapy is very expensive and he has been waiting on a grant.  Discussed events leading up to admission and course of hospitalization. Reviewed CT results with patient including concern of disease progression and concern of small bowel obstruction leading to a catastrophic event due to tumor burden/decision to NOT pursue surgical intervention. Explained high symptom burden that could occur with worsening SBO.   I attempted to elicit values and goals of care important to the patient including EOL wishes. He shares that he has spoken with his wife about his wishes including DNR/DNI and the desire to "die at home."   Introduced and completed MOST form. DNR/DNI in the event of cardiac arrest, limited interventions including re-hospitalization and CPAP/BiPAP if indicated, ABX/IVF if indicated, and NO feeding tube. Agreed with his decision for DNR/DNI with underlying chronic conditions and terminal cancer and fear that heroic measures at EOL would cause pain and suffering. Patient has durable DNR completed at home.  Patient does acknowledge understanding of diagnoses and poor prognosis, but also speaks of desire to "live" and "not kill myself" by NOT receiving blood transfusions, antibiotics, fluids,  etc.   Introduced hospice philosophy and options including role of comfort, symptom management, and prevention of recurrent hospitalization. Encouraged him to continue discussions with his wife in order to 'plan' for EOL at home, if this is his desire to die at home. Explained without hospice services, that chances of recurrent hospitalization are likely due to complications from abdominal tumor burden (worsening obstruction, symptoms, and/or anemia).   Patient shares his  strong, Darrick Meigs faith and belief that this is in [facility] and God will take him when it is his time.  Patient is not yet ready for transition to hospice services but agreeable with ongoing support from palliative services outpatient.   Answered questions and concerns. Therapeutic listening and emotional/spiritual support provided. Agreeable for palliative NP to call his wife with update.   **This afternoon, spoke with wife [name]) via telephone. Introduced role of palliative medicine. Discussed diagnoses, interventions, and plan of care including CT results. She confirmed plan against surgical intervention. Shared with [name] my same concerns of high risk for decline in the near future due to tumor burden and worsening SBO, symptoms, and/or anemia. Introduced hospice philosophy and ability to assist with EOL care at home, especially with his desire to "die at home." [name] appreciative of information and familiar with hospice philosophy from her father. She agrees with Muzamil's decisions regarding DNR/DNI and MOST form completion. Also encouraged [name] to continue discussions with her husband regarding EOL care and decisions. Will leave Hard Choices booklet at bedside. PMT contact information given. Emotional/spiriatul support provided. [name] states "not afraid of it" meaning death and states "God's in control."     SUMMARY OF RECOMMENDATIONS    GOC discussion with patient and also wife via telephone.   MOST form completed. DNR/DNI in event of cardiac arrest, rehospitalization if necessary including CPAP/BIPAP usage, IVF/ABX if indicated, NO feeding tube. Copies made for chart and family. Patient has durable DNR at home.  NO surgical intervention for SBO. Conservative management/symptom management. Tolerating clears. Denies pain.   Prepared patient and wife for high risk of catastrophic event leading to death from SBO and tumor burden. Reviewed CT results with unfortunate likelihood of cancer  progression.  Patient desires future blood transfusions if indicated. (Discussed with attending. HgB 6.8 this AM. PRBC indicated and to be ordered by attending).  Although patient shares his desire to "die at home," he is not ready for transition to hospice services at home. Agreeable to ongoing palliative services outpatient. Encouraged patient and wife to continue discussions regarding EOL wishes and recommendation for hospice services helping to facilitate his EOL wish of dying at home.   Discussed with Dr. Grandville Silos.  Code Status/Advance Care Planning:  DNR  Symptom Management:   Tramadol 40m PO q6h prn pain  Xanax 0.568mPO BID prn anxiety/sleep   Robaxin 50069mO q12h prn muscle spasms  Palliative Prophylaxis:   Delirium Protocol, Frequent Pain Assessment and Oral Care  Psycho-social/Spiritual:   Desire for further Chaplaincy support:yes  Additional Recommendations: Caregiving  Support/Resources, Compassionate Wean Education and Education on Hospice  Prognosis:   Poor prognosis with metastatic spindle cell carcinoma with disease progression on CT and small bowel obstruction. High risk for decompensation.  Discharge Planning: To Be Determined: Likely home with home health/palliative services      Primary Diagnoses: Present on Admission: . SBO (small bowel obstruction) ([facility] I have reviewed the medical record, interviewed the patient and family, and examined the patient. The following aspects are pertinent.  Past  Medical History:  Diagnosis Date  . Anxiety   . Bladder cancer ([place]) 2011  . Borderline diabetes mellitus   . CAD (coronary artery disease)   . Cigarette smoker   . COPD (chronic obstructive pulmonary disease) ([place])   . DJD (degenerative joint disease)   . Emphysema of lung ([place])   . Epistaxis   . History of colonic polyps 2004   hyperplastic   . History of sinusitis   . Hypercholesteremia   . Hypertension   . Obesity    Social History    Socioeconomic History  . Marital status: Married    Spouse name: [name]  . Number of children: 2  . Years of education: Not on file  . Highest education level: Not on file  Occupational History  . Occupation: Retired  Scientific laboratory technician  . Financial resource strain: Not on file  . Food insecurity:    Worry: Not on file    Inability: Not on file  . Transportation needs:    Medical: Not on file    Non-medical: Not on file  Tobacco Use  . Smoking status: Former Smoker    Packs/day: 2.00    Years: 45.00    Pack years: 90.00    Types: Cigarettes    Last attempt to quit: 10/17/2015    Years since quitting: 3.4  . Smokeless tobacco: Never Used  Substance and Sexual Activity  . Alcohol use: Yes    Alcohol/week: 0.0 standard drinks    Comment: glass of wine at night  . Drug use: No  . Sexual activity: Not on file  Lifestyle  . Physical activity:    Days per week: Not on file    Minutes per session: Not on file  . Stress: Not on file  Relationships  . Social connections:    Talks on phone: Not on file    Gets together: Not on file    Attends religious service: Not on file    Active member of club or organization: Not on file    Attends meetings of clubs or organizations: Not on file    Relationship status: Not on file  Other Topics Concern  . Not on file  Social History Narrative  . Not on file   Family History  Problem Relation Age of Onset  . Heart disease Mother   . Heart disease Brother   . Cancer Brother   . Aneurysm Brother    Scheduled Meds: . sodium chloride   Intravenous Once  . budesonide  0.5 mg Nebulization BID  . ipratropium-albuterol  3 mL Nebulization QID  . lisinopril  10 mg Oral Daily  . methocarbamol  1,000 mg Oral QHS  . pantoprazole  40 mg Oral Q0600  . sodium chloride flush  10-40 mL Intracatheter Q12H   Continuous Infusions: . sodium chloride 75 mL/hr at 03/28/19 0200   PRN Meds:.ALPRAZolam, fluticasone, ipratropium-albuterol, methocarbamol,  ondansetron (ZOFRAN) IV, sodium chloride flush, traMADol Medications Prior to Admission:  Prior to Admission medications   Medication Sig Start Date End Date Taking? Authorizing Provider  ALPRAZolam Duanne Moron) 0.5 MG tablet Take 1 tablet (0.5 mg total) by mouth at bedtime. 10/26/18  Yes Noralee Space, MD  Ascorbic Acid (VITAMIN C) 1000 MG tablet Take 1,000 mg by mouth daily.   Yes [provider]  budesonide (PULMICORT) 0.25 MG/2ML nebulizer solution Take 2 mLs (0.25 mg total) by nebulization 2 (two) times daily. 02/12/19  Yes Icard, Leory Plowman L, DO  cholecalciferol (VITAMIN D) 400  units TABS tablet Take 400 Units by mouth at bedtime.   Yes [provider]  Ferrous Sulfate (IRON) 325 (65 Fe) MG TABS Take 325 mg by mouth daily.   Yes [provider]  Flaxseed, Linseed, (FLAX SEEDS PO) Take 1 capsule by mouth daily.   Yes [provider]  fluticasone (FLONASE) 50 MCG/ACT nasal spray Place 2 sprays into both nostrils daily as needed for allergies or rhinitis.  09/21/18  Yes [provider]  furosemide (LASIX) 40 MG tablet Take 40 mg by mouth daily as needed for fluid.    Yes [provider]  ipratropium-albuterol (DUONEB) 0.5-2.5 (3) MG/3ML SOLN Take 3 mLs by nebulization every 4 (four) hours as needed. Patient taking differently: Take 3 mLs by nebulization 4 (four) times daily.  02/12/19  Yes Icard, Bradley L, DO  lisinopril (ZESTRIL) 10 MG tablet Take 10 mg by mouth daily.   Yes [provider]  methocarbamol (ROBAXIN) 500 MG tablet Take 1,000 mg by mouth at bedtime.  03/07/18  Yes [provider]  multivitamin-lutein (OCUVITE-LUTEIN) CAPS capsule Take 1 capsule by mouth 2 (two) times a day.    Yes [provider]  pantoprazole (PROTONIX) 40 MG tablet Take 40 mg by mouth daily. 03/03/19  Yes [provider]  simvastatin (ZOCOR) 20 MG tablet Take 1 tablet (20 mg total) by mouth daily at 6 PM. 11/19/15  Yes Weaver, Nicki Reaper T,  PA-C  vitamin B-12 (CYANOCOBALAMIN) 1000 MCG tablet Take 1,000 mcg by mouth daily.   Yes [provider]  acetaminophen (TYLENOL) 325 MG tablet Take 2 tablets (650 mg total) by mouth every 6 (six) hours as needed for mild pain (or Fever >/= 101). Patient not taking: Reported on 03/27/2019 06/11/18   Mariel Aloe, MD  Albuterol Sulfate (PROAIR RESPICLICK) 299 (90 Base) MCG/ACT AEPB Inhale 2 puffs into the lungs every 6 (six) hours as needed (shortness of breath). 10/26/18   Noralee Space, MD  nitroGLYCERIN (NITROSTAT) 0.4 MG SL tablet Place 1 tablet (0.4 mg total) under the tongue every 5 (five) minutes as needed for chest pain. 11/19/15   Richardson Dopp T, PA-C  pantoprazole (PROTONIX) 40 MG tablet Take 1 tablet (40 mg total) by mouth daily. Patient not taking: Reported on 03/27/2019 05/18/18 12/14/18  Shelly Coss, MD  Polyethyl Glycol-Propyl Glycol (SYSTANE OP) Apply 1 drop to eye daily as needed (dry eyes).    [provider]  sodium chloride (OCEAN) 0.65 % SOLN nasal spray Place 1 spray into both nostrils as needed for congestion. Patient taking differently: Place 1 spray into both nostrils daily as needed for congestion.  10/29/15   Cherene Altes, MD  VOTRIENT 200 MG tablet  02/05/19   [provider]   Allergies  Allergen Reactions  . Amlodipine Swelling   Review of Systems  Constitutional: Positive for activity change and appetite change.  Respiratory: Positive for shortness of breath.   Gastrointestinal: Positive for nausea.  Neurological: Positive for weakness.   Physical Exam Vitals signs and nursing note reviewed.  Constitutional:      General: He is awake.     Appearance: He is ill-appearing.  HENT:     Head: Normocephalic and atraumatic.  Cardiovascular:     Rate and Rhythm: Normal rate.  Pulmonary:     Effort: No tachypnea, accessory muscle usage or respiratory distress.     Comments: Home oxygen 6L. Occasional pursed lip breathing  Abdominal:     General: There is  distension.     Tenderness: There is no abdominal tenderness.  Skin:    General: Skin is warm and dry.  Neurological:     Mental Status: He is alert.  Psychiatric:        Mood and Affect: Mood normal.        Speech: Speech normal.        Behavior: Behavior normal.        Cognition and Memory: Cognition normal.     Vital Signs: BP 128/60 (BP Location: Left Arm)   Pulse 80   Temp 98.6 F (37 C) (Oral)   Resp 16   Ht _0  (1.676 m)   Wt 99.3 kg   SpO2 100%   BMI 35.35 kg/m  Pain Scale: 0-10   Pain Score: 0-No pain   SpO2: SpO2: 100 % O2 Device:SpO2: 100 % O2 Flow Rate: .O2 Flow Rate (L/min): 6 L/min  IO: Intake/output summary:   Intake/Output Summary (Last 24 hours) at 03/28/2019 1525 Last data filed at 03/28/2019 0957 Gross per 24 hour  Intake 1010.75 ml  Output 1001 ml  Net 9.75 ml    LBM: Last BM Date: 03/27/19 Baseline Weight: Weight: 99.3 kg Most recent weight: Weight: 99.3 kg     Palliative Assessment/Data: PPS 60%   Flowsheet Rows     Most Recent Value  Intake Tab  Referral Department  Hospitalist  Unit at Time of Referral  Med/Surg Unit  Palliative Care Primary Diagnosis  Cancer  Palliative Care Type  Return patient Palliative Care  Reason for referral  Clarify Goals of Care  Date first seen by Palliative Care  03/28/19  Clinical Assessment  Palliative Performance Scale Score  60%  Psychosocial & Spiritual Assessment  Palliative Care Outcomes  Patient/Family meeting held?  Yes  Who was at the meeting?  patient. Spoke with wife via telephone  Palliative Care Outcomes  Clarified goals of care, Counseled regarding hospice, Improved pain interventions, Improved non-pain symptom therapy, Provided end of life care assistance, Provided advance care planning, Provided psychosocial or spiritual support, ACP counseling assistance, Linked to palliative care logitudinal support      Time In/Out: 1000-1100, 1300-1335 Time  Total: 46mn Greater than 50%  of this time was spent counseling and coordinating care related to the above assessment and plan.  Signed by:  MIhor Dow FNP-C Palliative Medicine Team  Phone: [phone]: [phone] Please contact Palliative Medicine Team phone at [phone] questions and concerns.  For individual provider: See AShea Evans

## 2019-03-28 NOTE — Progress Notes (Signed)
PROGRESS NOTE    [name]  YPP:[id number] DOB: 1946-03-11 DOA: 03/27/2019 PCP: [name], MD    Brief Narrative:  HPI per  Dr. [name] is a 73 y.o. 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  He reports having a bowel movement this morning on the day of admission and moving flatus.  He denied any nausea or vomiting.  However he reported nausea vomiting and diarrhea to the nursing staff on Sunday which is a day prior to this admission.  He has no complaints of fever chills cough.  Patient had another hospital admission for rectal bleed last month.  Patient was nauseous in the ER. He has COPD and is on 6 L of oxygen 24/7.  He is aware his prognosis is guarded and he is agreeable to see palliative care and he is DO NOT RESUSCITATE.  ED Course: CT scan of the abdomen and pelvis done in the ER shows due to large right-sided tumor, large right-sided intra-abdominal tumor, catheter new pulmonary nodules measuring 2.6 cm in diameter compatible with metastatic disease several of the nodules are chronic but most of them are new reduction in size of the lateral segment of the left hepatic lobe lesion status post prior embolization new 2 cm ill-defined lesion in the segment 5 of the liver which may be a new metastatic lesion, markedly severe emphysema sigmoid colon diverticulosis.  Vital signs 12/12/1967 pulse is 110 respiration 17 saturation 97% on 4 L.  Assessment & Plan:   Principal Problem:   SBO (small bowel obstruction) (HCC) Active Problems:   End stage COPD (HCC)   OSA treated with BiPAP   Spindle cell carcinoma (HCC)   Liver metastasis (HCC)   Anemia of chronic disease  1 small bowel obstruction secondary to  compression of intra-abdominal metastatic tumor from spindle cell sarcoma Patient noted to have bowel movement the morning of admission.  Patient had presented with nausea.  CT abdomen and pelvis concerning for small bowel obstruction.  Nausea improving.  Patient with no emesis.  Patient seen in consultation by general surgery and is noted that patient with partial small bowel obstruction but tolerating liquids and to advance his diet as tolerated.  Per general surgery no need for surgery at this time and patient made the decision not to have any aggressive intervention done at this time.  Patient tolerating clear liquids.  Will advance to full liquid diet and follow.  Replete electrolytes and keep magnesium greater than 2 and potassium greater than 4.  Supportive care.  Follow.  2.  Anemia of chronic disease Likely secondary to spindle cell carcinoma with mets to the liver and bladder.  Patient with no overt bleeding.  Hemoglobin at 6.8.  Transfuse 1 unit packed red blood cells.  Follow H&H.  Outpatient follow-up.  3.  Obstructive sleep apnea BiPAP nightly.  4.  COPD/severe emphysema Patient chronically on 6 L nasal cannula.  Patient denies any significant worsening of shortness of breath.  Poor to fair air movement.  Patient requesting to be discontinued from Xopenex and Atrovent and placed back on his duo nebs which we will do.  Supportive care.  5.  Coronary artery disease Stable.  Patient noted to be tachycardic felt likely secondary to anemia.  Follow.  6.  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 Palliative care consulted and are following.   DVT prophylaxis: SCDs Code Status: DNR Family Communication: Updated patient.  No family at bedside. Disposition Plan: Likely home with palliative care following when medically stable hopefully in the next 24 to 48 hours.   Consultants:   General surgery: Dr.  Lucia Gaskins 03/27/2019  Palliative care: Ihor Dow, NP 03/28/2019  Procedures:   CT abdomen and pelvis 03/27/2019  CT chest 03/27/2019  Chest x-ray 03/27/2019  1 unit packed red blood cells pending for transfusion  Antimicrobials:   None   Subjective: Patient sitting up on the side of the bed.  Patient denies any chest pain.  Patient denies any significant change in shortness of breath from his baseline.  Patient states feeling fatigue and feels secondary to symptomatic anemia.  Patient asking for Xopenex to be discontinued and placed back on his home regimen of duo nebs.  Objective: Vitals:   03/28/19 1404 03/28/19 1425 03/28/19 1638 03/28/19 1737  BP: 123/61 128/60  128/62  Pulse: 85 80  85  Resp: 14 16  16   Temp: 98.3 F (36.8 C) 98.6 F (37 C)  98.5 F (36.9 C)  TempSrc: Oral Oral  Oral  SpO2: 100% 100% 98% 100%  Weight:      Height:        Intake/Output Summary (Last 24 hours) at 03/28/2019 1937 Last data filed at 03/28/2019 1725 Gross per 24 hour  Intake 1998.75 ml  Output 1801 ml  Net 197.75 ml   Filed Weights   03/27/19 1129  Weight: 99.3 kg    Examination:  General exam: Appears calm and comfortable  Respiratory system: Poor air movement.  No wheezing noted.  No crackles.  Speaking in full sentences.  Respiratory effort normal. Cardiovascular system: S1 & S2 heard, RRR. No JVD, murmurs, rubs, gallops or clicks. No pedal edema. Gastrointestinal system: Abdomen is mildly distended, soft, positive bowel sounds, non-tender to palpation.  Central nervous system: Alert and oriented. No focal neurological deficits. Extremities: Symmetric 5 x 5 power. Skin: No rashes, lesions or ulcers Psychiatry: Judgement and insight appear normal. Mood & affect appropriate.     Data Reviewed: I have personally reviewed following labs and imaging studies  CBC: Recent Labs  Lab 03/27/19 1225 03/28/19 0458  WBC 5.1 4.1  HGB 8.1* 6.8*  HCT 26.2* 22.1*  MCV 98.9 98.2    PLT 166 993   Basic Metabolic Panel: Recent Labs  Lab 03/27/19 1225 03/28/19 0458  NA 135 136  K 4.9 3.9  CL 103 104  CO2 24 25  GLUCOSE 134* 128*  BUN 11 10  CREATININE 0.76 0.83  CALCIUM 8.5* 8.1*   GFR: Estimated Creatinine Clearance: 87.4 mL/min (by C-G formula based on SCr of 0.83 mg/dL). Liver Function Tests: Recent Labs  Lab 03/27/19 1225 03/28/19 0458  AST 43* 29  ALT 31 27  ALKPHOS 66 57  BILITOT 2.1* 1.9*  PROT 6.8 5.8*  ALBUMIN 3.5 3.0*   No results for input(s): LIPASE, AMYLASE in the last 168 hours. No results for input(s): AMMONIA in the last 168 hours. Coagulation Profile: No results for input(s): INR, PROTIME in the last 168 hours. Cardiac Enzymes: No results for input(s): CKTOTAL, CKMB, CKMBINDEX, TROPONINI in the last 168 hours. BNP (last 3 results) No results for input(s): PROBNP in the last 8760 hours. HbA1C: No results for input(s): HGBA1C  in the last 72 hours. CBG: No results for input(s): GLUCAP in the last 168 hours. Lipid Profile: No results for input(s): CHOL, HDL, LDLCALC, TRIG, CHOLHDL, LDLDIRECT in the last 72 hours. Thyroid Function Tests: No results for input(s): TSH, T4TOTAL, FREET4, T3FREE, THYROIDAB in the last 72 hours. Anemia Panel: No results for input(s): VITAMINB12, FOLATE, FERRITIN, TIBC, IRON, RETICCTPCT in the last 72 hours. Sepsis Labs: No results for input(s): PROCALCITON, LATICACIDVEN in the last 168 hours.  Recent Results (from the past 240 hour(s))  SARS Coronavirus 2 (CEPHEID - Performed in [place] hospital lab), Hosp Order     Status: None   Collection Time: 03/27/19 12:25 PM  Result Value Ref Range Status   SARS Coronavirus 2 NEGATIVE NEGATIVE Final    Comment: (NOTE) If result is NEGATIVE SARS-CoV-2 target nucleic acids are NOT DETECTED. The SARS-CoV-2 RNA is generally detectable in upper and lower  respiratory specimens during the acute phase of infection. The lowest  concentration of SARS-CoV-2  viral copies this assay can detect is 250  copies / mL. A negative result does not preclude SARS-CoV-2 infection  and should not be used as the sole basis for treatment or other  patient management decisions.  A negative result may occur with  improper specimen collection / handling, submission of specimen other  than nasopharyngeal swab, presence of viral mutation(s) within the  areas targeted by this assay, and inadequate number of viral copies  (<250 copies / mL). A negative result must be combined with clinical  observations, patient history, and epidemiological information. If result is POSITIVE SARS-CoV-2 target nucleic acids are DETECTED. The SARS-CoV-2 RNA is generally detectable in upper and lower  respiratory specimens dur ing the acute phase of infection.  Positive  results are indicative of active infection with SARS-CoV-2.  Clinical  correlation with patient history and other diagnostic information is  necessary to determine patient infection status.  Positive results do  not rule out bacterial infection or co-infection with other viruses. If result is PRESUMPTIVE POSTIVE SARS-CoV-2 nucleic acids MAY BE PRESENT.   A presumptive positive result was obtained on the submitted specimen  and confirmed on repeat testing.  While 2019 novel coronavirus  (SARS-CoV-2) nucleic acids may be present in the submitted sample  additional confirmatory testing may be necessary for epidemiological  and / or clinical management purposes  to differentiate between  SARS-CoV-2 and other Sarbecovirus currently known to infect humans.  If clinically indicated additional testing with an alternate test  methodology [phone]) is advised. The SARS-CoV-2 RNA is generally  detectable in upper and lower respiratory sp ecimens during the acute  phase of infection. The expected result is Negative. Fact Sheet for Patients:  StrictlyIdeas.no Fact Sheet for Healthcare  Providers: BankingDealers.co.za This test is not yet approved or cleared by the [place] FDA and has been authorized for detection and/or diagnosis of SARS-CoV-2 by FDA under an Emergency Use Authorization (EUA).  This EUA will remain in effect (meaning this test can be used) for the duration of the COVID-19 declaration under Section 564(b)(1) of the Act, 21 U.S.C. section 360bbb-3(b)(1), unless the authorization is terminated or revoked sooner. Performed at [facility], [place] [address], [place], [place] [address]          Radiology Studies: Ct Chest W Contrast  Result Date: 03/27/2019 CLINICAL DATA:  Abdominal spindle cell sarcoma with known hepatic metastatic disease. Enlarging pulmonary nodules on recent chest radiograph. Vomiting and diarrhea. Weakness. Home oxygen for COPD.  Tachycardia. EXAM: CT CHEST, ABDOMEN, AND PELVIS WITH CONTRAST TECHNIQUE: Multidetector CT imaging of the chest, abdomen and pelvis was performed following the standard protocol during bolus administration of intravenous contrast. CONTRAST:  116mL OMNIPAQUE IOHEXOL 300 MG/ML  SOLN COMPARISON:  Multiple exams, including 12/14/2018, 02/17/2017, and chest radiograph from 03/27/2019 FINDINGS: CT CHEST FINDINGS Cardiovascular: Coronary, aortic arch, and branch vessel atherosclerotic vascular disease. Mediastinum/Nodes: Unremarkable Lungs/Pleura: Scattered pulmonary nodules in the lungs are mostly new compared to 02/17/2017 although several are stable. A new right upper lobe nodule on image 45/6 measures 1.5 by 1.5 cm. A new right lower lobe nodule on image 115/6 measures 2.6 by 2.1 cm. A new left lower lobe nodule on image 70/6 measures 1.8 by 1.5 cm. Multiple additional new nodules are present. Markedly severe emphysema. Musculoskeletal: Lower thoracic spondylosis. CT ABDOMEN PELVIS FINDINGS Hepatobiliary: Prior chemoembolization of a left hepatic lobe lesion with coils noted and  a distal left hepatic artery branch, and with a cystic and rim calcified treated lesion measuring 4.8 by 3.7 cm on image 56/2, formerly 5.4 by 4.1 cm on 12/14/2018. Medially in segment 2 of the liver, a 3.7 by 2.8 cm hypodense lesion on image 49/2 was previously 4.8 by 3.9 cm by my measurements, a significant improvement. 1.0 cm gallstone in the gallbladder. Ill-defined potential 2.0 by 1.2 cm lesion in segment 5 of the liver on image 65/2, not well appreciated on prior CT exams, and only vaguely seen today. In the dome of the right hepatic lobe there is a suggestion of a 1.0 cm hyperdense lesion on image 51/2 which could be a small hemangioma but which is technically nonspecific. Pancreas: Unremarkable Spleen: Unremarkable Adrenals/Urinary Tract: 6.6 cm in diameter right mid kidney fluid density cyst. Adrenal glands normal. Urinary bladder unremarkable. Stomach/Bowel: Small-bowel obstruction with dilated and thick-walled loops of small bowel up to 4.4 cm in diameter. Mild associated mesenteric edema along the involved loops. The transition point is along the anterior margin of the large right abdominal tumor which is at least effacing and extrinsically compressing the involved small bowel loop, and invasion of the loop is not excluded. Sigmoid colon diverticulosis. Vascular/Lymphatic: Aortoiliac atherosclerotic vascular disease. Reproductive: Unremarkable Other: Eccentric to the right in the abdomen, the large abdominal mass currently measures 24.0 by 16.0 by 18.5 cm (volume = 3720 cm^3). There is adjacent ascites in the right lower quadrant, nonspecific for malignant versus bland ascites. Musculoskeletal: Spondylosis and degenerative disc disease at L5-S1 causing bilateral foraminal impingement. IMPRESSION: 1. Small-bowel obstruction due to the large right-sided tumor. Dilated thick-walled loops of small bowel extend to a loop which runs anterior to the tumor. This loop is severely effaced and likely compressed by  the tumor, and invasion is not excluded. 2. Large right-sided intra-abdominal tumor currently measures at 3720 cubic cm, previously 3110 cubic cm on 12/14/2018. 3. Compared to the most recent chest CT from 02/17/2017, there are various scattered new pulmonary nodules measuring up to about 2.6 cm in diameter, compatible with metastatic disease. Several of the pulmonary nodules are chronic but most are new. 4. Further reduction in size of the lateral segment left hepatic lobe lesions, status post prior embolization. However, there is a questionable new 2.0 cm ill-defined lesion in segment 5 of the liver which could be a new metastatic lesion. 5. Markedly severe emphysema.  Emphysema (ICD10-J43.9). 6.  Aortic Atherosclerosis (ICD10-I70.0).  Coronary atherosclerosis. 7. Lumbar impingement L5-S1. 8. Sigmoid colon diverticulosis. Electronically Signed   By: Cindra Eves.D.  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 Enlarging pulmonary nodules on recent chest radiograph. Vomiting and diarrhea. Weakness. Home oxygen for COPD. Tachycardia. EXAM: CT CHEST, ABDOMEN, AND PELVIS WITH CONTRAST TECHNIQUE: Multidetector CT imaging of the chest, abdomen and pelvis was performed following the standard protocol during bolus administration of intravenous contrast. CONTRAST:  136mL OMNIPAQUE IOHEXOL 300 MG/ML  SOLN COMPARISON:  Multiple exams, including 12/14/2018, 02/17/2017, and chest radiograph from 03/27/2019 FINDINGS: CT CHEST FINDINGS Cardiovascular: Coronary, aortic arch, and branch vessel atherosclerotic vascular disease. Mediastinum/Nodes: Unremarkable Lungs/Pleura: Scattered pulmonary nodules in the lungs are mostly new compared to 02/17/2017 although several are stable. A new right upper lobe nodule on image 45/6 measures 1.5 by 1.5 cm. A new right lower lobe nodule on image 115/6 measures 2.6  by 2.1 cm. A new left lower lobe nodule on image 70/6 measures 1.8 by 1.5 cm. Multiple additional new nodules are present. Markedly severe emphysema. Musculoskeletal: Lower thoracic spondylosis. CT ABDOMEN PELVIS FINDINGS Hepatobiliary: Prior chemoembolization of a left hepatic lobe lesion with coils noted and a distal left hepatic artery branch, and with a cystic and rim calcified treated lesion measuring 4.8 by 3.7 cm on image 56/2, formerly 5.4 by 4.1 cm on 12/14/2018. Medially in segment 2 of the liver, a 3.7 by 2.8 cm hypodense lesion on image 49/2 was previously 4.8 by 3.9 cm by my measurements, a significant improvement. 1.0 cm gallstone in the gallbladder. Ill-defined potential 2.0 by 1.2 cm lesion in segment 5 of the liver on image 65/2, not well appreciated on prior CT exams, and only vaguely seen today. In the dome of the right hepatic lobe there is a suggestion of a 1.0 cm hyperdense lesion on image 51/2 which could be a small hemangioma but which is technically nonspecific. Pancreas: Unremarkable Spleen: Unremarkable Adrenals/Urinary Tract: 6.6 cm in diameter right mid kidney fluid density cyst. Adrenal glands normal. Urinary bladder unremarkable. Stomach/Bowel: Small-bowel obstruction with dilated and thick-walled loops of small bowel up to 4.4 cm in diameter. Mild associated mesenteric edema along the involved loops. The transition point is along the anterior margin of the large right abdominal tumor which is at least effacing and extrinsically compressing the involved small bowel loop, and invasion of the loop is not excluded. Sigmoid colon diverticulosis. Vascular/Lymphatic: Aortoiliac atherosclerotic vascular disease. Reproductive: Unremarkable Other: Eccentric to the right in the abdomen, the large abdominal mass currently measures 24.0 by 16.0 by 18.5 cm (volume = 3720 cm^3). There is adjacent ascites in the right lower quadrant, nonspecific for malignant versus bland ascites. Musculoskeletal:  Spondylosis and degenerative disc disease at L5-S1 causing bilateral foraminal impingement. IMPRESSION: 1. Small-bowel obstruction due to the large right-sided tumor. Dilated thick-walled loops of small bowel extend to a loop which runs anterior to the tumor. This loop is severely effaced and likely compressed by the tumor, and invasion is not excluded. 2. Large right-sided intra-abdominal tumor currently measures at 3720 cubic cm, previously 3110 cubic cm on 12/14/2018. 3. Compared to the most recent chest CT from 02/17/2017, there are various scattered new pulmonary nodules measuring up to about 2.6 cm in diameter, compatible with metastatic disease. Several of the pulmonary nodules are chronic but most are new. 4. Further reduction in size of the lateral segment left hepatic lobe lesions, status post prior embolization. However, there is a questionable new 2.0 cm ill-defined lesion in segment 5 of the liver  which could be a new metastatic lesion. 5. Markedly severe emphysema.  Emphysema (ICD10-J43.9). 6.  Aortic Atherosclerosis (ICD10-I70.0).  Coronary atherosclerosis. 7. Lumbar impingement L5-S1. 8. Sigmoid colon diverticulosis. Electronically Signed   By: Van Clines M.D.   On: 03/27/2019 16:18   Dg Chest Portable 1 View  Result Date: 03/27/2019 CLINICAL DATA:  Weakness with nausea and vomiting EXAM: PORTABLE CHEST 1 VIEW COMPARISON:  02/19/2019, CT from 02/17/2017 FINDINGS: Cardiac shadow is stable. Aortic calcifications are noted. The lungs are well aerated bilaterally and demonstrate multiple pulmonary nodules increased from the prior exam. Largest of these lies in the right upper lobe measuring 2.1 cm in greatest dimension. No acute bony abnormality is noted. IMPRESSION: Enlarging pulmonary nodules bilaterally. CT of the chest is recommended for further evaluation. Electronically Signed   By: Inez Catalina M.D.   On: 03/27/2019 12:48        Scheduled Meds:  sodium chloride   Intravenous  Once   budesonide  0.5 mg Nebulization BID   ipratropium-albuterol  3 mL Nebulization QID   lisinopril  10 mg Oral Daily   methocarbamol  1,000 mg Oral QHS   pantoprazole  40 mg Oral Q0600   sodium chloride flush  10-40 mL Intracatheter Q12H   Continuous Infusions:  sodium chloride 75 mL/hr at 03/28/19 0200     LOS: 0 days    Time spent: 35 minutes    Irine Seal, MD [facility]  If 7PM-7AM, please contact night-coverage [email or web address] 03/28/2019, 7:37 PM

## 2019-03-29 LAB — BASIC METABOLIC PANEL
Anion gap: 8 (ref 5–15)
BUN: 8 mg/dL (ref 8–23)
CO2: 24 mmol/L (ref 22–32)
Calcium: 8.1 mg/dL — ABNORMAL LOW (ref 8.9–10.3)
Chloride: 104 mmol/L (ref 98–111)
Creatinine, Ser: 0.76 mg/dL (ref 0.61–1.24)
GFR calc Af Amer: >60 mL/min (ref >60)
GFR calc non Af Amer: >60 mL/min (ref >60)
Glucose, Bld: 134 mg/dL — ABNORMAL HIGH (ref 70–99)
Potassium: 3.9 mmol/L (ref 3.5–5.1)
Sodium: 136 mmol/L (ref 135–145)

## 2019-03-29 LAB — CBC
HCT: 23.7 % — ABNORMAL LOW (ref 39.0–52.0)
Hemoglobin: 7.3 g/dL — ABNORMAL LOW (ref 13.0–17.0)
MCH: 29.3 pg (ref 26.0–34.0)
MCHC: 30.8 g/dL (ref 30.0–36.0)
MCV: 95.2 fL (ref 80.0–100.0)
Platelets: 142 10*3/uL — ABNORMAL LOW (ref 150–400)
RBC: 2.49 MIL/uL — ABNORMAL LOW (ref 4.22–5.81)
RDW: 22.6 % — ABNORMAL HIGH (ref 11.5–15.5)
WBC: 3.4 10*3/uL — ABNORMAL LOW (ref 4.0–10.5)
nRBC: 0 % (ref 0.0–0.2)

## 2019-03-29 LAB — HEMOGLOBIN AND HEMATOCRIT, BLOOD
HCT: 26.8 % — ABNORMAL LOW (ref 39.0–52.0)
Hemoglobin: 8.4 g/dL — ABNORMAL LOW (ref 13.0–17.0)

## 2019-03-29 LAB — MAGNESIUM: Magnesium: 1.7 mg/dL (ref 1.7–2.4)

## 2019-03-29 MED ORDER — POLYETHYL GLYCOL-PROPYL GLYCOL 0.4-0.3 % OP GEL: Freq: Every day | OPHTHALMIC | Status: DC | PRN

## 2019-03-29 MED ORDER — PROSIGHT PO TABS
1.0000 | ORAL_TABLET | Freq: Two times a day (BID) | ORAL | Status: DC
  Administered 2019-03-29 – 2019-03-30 (×2): 1 via ORAL
  Filled 2019-03-29 (×2): qty 1

## 2019-03-29 MED ORDER — MAGNESIUM SULFATE 4 GM/100ML IV SOLN
4.0000 g | Freq: Once | INTRAVENOUS | Status: AC
  Administered 2019-03-29: 4 g via INTRAVENOUS
  Filled 2019-03-29: qty 100

## 2019-03-29 MED ORDER — FERROUS SULFATE 325 (65 FE) MG PO TABS
325.0000 mg | ORAL_TABLET | Freq: Every day | ORAL | Status: DC
  Administered 2019-03-30: 09:00:00 325 mg via ORAL
  Filled 2019-03-29: qty 1

## 2019-03-29 MED ORDER — VITAMIN B-12 1000 MCG PO TABS
1000.0000 ug | ORAL_TABLET | Freq: Every day | ORAL | Status: DC
  Administered 2019-03-30: 09:00:00 1000 ug via ORAL
  Filled 2019-03-29: qty 1

## 2019-03-29 MED ORDER — SIMVASTATIN 20 MG PO TABS
20.0000 mg | ORAL_TABLET | Freq: Every day | ORAL | Status: DC
  Administered 2019-03-29: 17:00:00 20 mg via ORAL
  Filled 2019-03-29: qty 1

## 2019-03-29 MED ORDER — FUROSEMIDE 10 MG/ML IJ SOLN
20.0000 mg | Freq: Once | INTRAMUSCULAR | Status: AC
  Administered 2019-03-29: 20 mg via INTRAVENOUS
  Filled 2019-03-29: qty 2

## 2019-03-29 MED ORDER — POTASSIUM CHLORIDE CRYS ER 20 MEQ PO TBCR
20.0000 meq | EXTENDED_RELEASE_TABLET | Freq: Once | ORAL | Status: AC
  Administered 2019-03-29: 10:00:00 20 meq via ORAL
  Filled 2019-03-29: qty 1

## 2019-03-29 MED ORDER — SALINE SPRAY 0.65 % NA SOLN: 1.0000 | Freq: Every day | NASAL | Status: DC | PRN

## 2019-03-29 MED ORDER — PANTOPRAZOLE SODIUM 40 MG PO TBEC: 40.0000 mg | DELAYED_RELEASE_TABLET | Freq: Every day | ORAL | Status: DC

## 2019-03-29 NOTE — Progress Notes (Addendum)
PROGRESS NOTE    [name]  IPJ:[id number] DOB: 09-Jul-1946 DOA: 03/27/2019 PCP: [name], MD    Brief Narrative:  HPI per  Dr. [name] is a 73 y.o. 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  He reports having a bowel movement this morning on the day of admission and moving flatus.  He denied any nausea or vomiting.  However he reported nausea vomiting and diarrhea to the nursing staff on Sunday which is a day prior to this admission.  He has no complaints of fever chills cough.  Patient had another hospital admission for rectal bleed last month.  Patient was nauseous in the ER. He has COPD and is on 6 L of oxygen 24/7.  He is aware his prognosis is guarded and he is agreeable to see palliative care and he is DO NOT RESUSCITATE.  ED Course: CT scan of the abdomen and pelvis done in the ER shows due to large right-sided tumor, large right-sided intra-abdominal tumor, catheter new pulmonary nodules measuring 2.6 cm in diameter compatible with metastatic disease several of the nodules are chronic but most of them are new reduction in size of the lateral segment of the left hepatic lobe lesion status post prior embolization new 2 cm ill-defined lesion in the segment 5 of the liver which may be a new metastatic lesion, markedly severe emphysema sigmoid colon diverticulosis.  Vital signs 12/12/1966 pulse is 110 respiration 17 saturation 97% on 4 L.  Assessment & Plan:   Principal Problem:   SBO (small bowel obstruction) (HCC) Active Problems:   End stage COPD (HCC)   OSA treated with BiPAP   Spindle cell carcinoma (HCC)   Liver metastasis (HCC)   Anemia of chronic disease  1 small bowel obstruction secondary to  compression of intra-abdominal metastatic tumor from spindle cell sarcoma Patient noted to have bowel movement the morning of admission.  Patient had presented with nausea.  CT abdomen and pelvis concerning for small bowel obstruction.  Nausea improved.  Patient with no emesis.  Patient seen in consultation by general surgery and is noted that patient with partial small bowel obstruction but tolerating liquids and to advance his diet as tolerated.  Per general surgery no need for surgery at this time and patient made the decision NOT to have any aggressive intervention done at this time.  Patient tolerating full liquid diet.  Patient stated had bowel movements.  Advance diet to a soft diet.  Magnesium at 1.7 will give IV magnesium to keep magnesium greater than 2.  Potassium at 3.9 we will give K. Dur 20 mEq p.o. x1.  Saline lock IV fluids.  Supportive care.  Follow.  2.  Anemia of chronic disease Likely secondary to spindle cell carcinoma with mets to the liver and bladder.  Patient with no overt bleeding.  Hemoglobin at 7.3 from 8.2 from 6.8 after 1 unit packed red blood cells.  Repeat H&H this afternoon.  Transfusion threshold hemoglobin less than 7.  Outpatient follow-up.  3.  Obstructive sleep apnea Continue BiPAP nightly.  4.  COPD/severe emphysema/chronic hypoxemic respiratory failure on 6 L nasal cannula home O2. Patient chronically on 6 L nasal cannula.  Patient denies any significant worsening of shortness of breath.  Poor to fair air movement.  Patient resumed back on home regimen of duo nebs.  Titrate O2 down to keep sats between 89 to 93%. Supportive care.  5.  Coronary artery disease Stable.  Patient noted to be tachycardic felt likely secondary to anemia.  Tachycardia improved.  Continue lisinopril, Zocor.  Follow.  6.  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 Palliative care consulted and are  following.  7.  Mild volume overload Likely secondary to hydration with IV fluids in addition to transfusion of packed red blood cells.  Saline lock IV fluids.  Lasix 20 mg IV x1.  Strict I's and O's.  Daily weights.   DVT prophylaxis: SCDs Code Status: DNR Family Communication: Updated patient.  No family at bedside. Disposition Plan: Likely home with palliative care following when medically stable, and stable hemoglobin, hopefully in the next 24 to 48 hours.   Consultants:   General surgery: Dr. Lucia Gaskins 03/27/2019  Palliative care: Ihor Dow, NP 03/28/2019  Procedures:   CT abdomen and pelvis 03/27/2019  CT chest 03/27/2019  Chest x-ray 03/27/2019  Transfusional 1 unit packed red blood cells 03/28/2019.  Antimicrobials:   None   Subjective: Patient sitting up at the side of the bed.  Denies any chest pain.  No shortness of breath.  Stated he had a bowel movement last night and 2 bowel movements this morning.  Denies any nausea or emesis.  Tolerated full liquid diet.  Denies any overt bleeding.  Objective: Vitals:   03/29/19 0614 03/29/19 0652 03/29/19 0734 03/29/19 0739  BP: 134/82     Pulse: (!) 111 84    Resp: 20     Temp: 98.2 F (36.8 C)     TempSrc: Oral     SpO2: 99%  98% 98%  Weight:      Height:        Intake/Output Summary (Last 24 hours) at 03/29/2019 1150 Last data filed at 03/29/2019 0931 Gross per 24 hour  Intake 3621.41 ml  Output 1375 ml  Net 2246.41 ml   Filed Weights   03/27/19 1129  Weight: 99.3 kg    Examination:  General exam: NAD Respiratory system: Poor air movement.  No wheezing.  Some bibasilar coarse breath sounds/crackles. Respiratory effort normal. Cardiovascular system: Regular rate rhythm no murmurs rubs or gallops.  No JVD.  1-2+ bilateral lower extremity edema.  Gastrointestinal system: Abdomen is mildly distended, soft, positive bowel sounds, non-tender to palpation.  No rebound.  No guarding. Central nervous system: Alert  and oriented. No focal neurological deficits. Extremities: 1-2+ bilateral lower extremity edema. Skin: No rashes, lesions or ulcers Psychiatry: Judgement and insight appear normal. Mood & affect appropriate.     Data Reviewed: I have personally reviewed following labs and imaging studies  CBC: Recent Labs  Lab 03/27/19 1225 03/28/19 0458 03/28/19 2025 03/29/19 0333  WBC 5.1 4.1  --  3.4*  HGB 8.1* 6.8* 8.2* 7.3*  HCT 26.2* 22.1* 25.9* 23.7*  MCV 98.9 98.2  --  95.2  PLT 166 155  --  650*   Basic Metabolic Panel: Recent Labs  Lab 03/27/19 1225 03/28/19 0458 03/29/19 0333  NA 135 136 136  K 4.9 3.9 3.9  CL 103 104 104  CO2 24 25 24   GLUCOSE 134* 128* 134*  BUN 11 10 8   CREATININE 0.76 0.83 0.76  CALCIUM 8.5* 8.1* 8.1*  MG  --   --  1.7  GFR: Estimated Creatinine Clearance: 90.7 mL/min (by C-G formula based on SCr of 0.76 mg/dL). Liver Function Tests: Recent Labs  Lab 03/27/19 1225 03/28/19 0458  AST 43* 29  ALT 31 27  ALKPHOS 66 57  BILITOT 2.1* 1.9*  PROT 6.8 5.8*  ALBUMIN 3.5 3.0*   No results for input(s): LIPASE, AMYLASE in the last 168 hours. No results for input(s): AMMONIA in the last 168 hours. Coagulation Profile: No results for input(s): INR, PROTIME in the last 168 hours. Cardiac Enzymes: No results for input(s): CKTOTAL, CKMB, CKMBINDEX, TROPONINI in the last 168 hours. BNP (last 3 results) No results for input(s): PROBNP in the last 8760 hours. HbA1C: No results for input(s): HGBA1C in the last 72 hours. CBG: No results for input(s): GLUCAP in the last 168 hours. Lipid Profile: No results for input(s): CHOL, HDL, LDLCALC, TRIG, CHOLHDL, LDLDIRECT in the last 72 hours. Thyroid Function Tests: No results for input(s): TSH, T4TOTAL, FREET4, T3FREE, THYROIDAB in the last 72 hours. Anemia Panel: No results for input(s): VITAMINB12, FOLATE, FERRITIN, TIBC, IRON, RETICCTPCT in the last 72 hours. Sepsis Labs: No results for input(s):  PROCALCITON, LATICACIDVEN in the last 168 hours.  Recent Results (from the past 240 hour(s))  SARS Coronavirus 2 (CEPHEID - Performed in [facility] hospital lab), Hosp Order     Status: None   Collection Time: 03/27/19 12:25 PM  Result Value Ref Range Status   SARS Coronavirus 2 NEGATIVE NEGATIVE Final    Comment: (NOTE) If result is NEGATIVE SARS-CoV-2 target nucleic acids are NOT DETECTED. The SARS-CoV-2 RNA is generally detectable in upper and lower  respiratory specimens during the acute phase of infection. The lowest  concentration of SARS-CoV-2 viral copies this assay can detect is 250  copies / mL. A negative result does not preclude SARS-CoV-2 infection  and should not be used as the sole basis for treatment or other  patient management decisions.  A negative result may occur with  improper specimen collection / handling, submission of specimen other  than nasopharyngeal swab, presence of viral mutation(s) within the  areas targeted by this assay, and inadequate number of viral copies  (<250 copies / mL). A negative result must be combined with clinical  observations, patient history, and epidemiological information. If result is POSITIVE SARS-CoV-2 target nucleic acids are DETECTED. The SARS-CoV-2 RNA is generally detectable in upper and lower  respiratory specimens dur ing the acute phase of infection.  Positive  results are indicative of active infection with SARS-CoV-2.  Clinical  correlation with patient history and other diagnostic information is  necessary to determine patient infection status.  Positive results do  not rule out bacterial infection or co-infection with other viruses. If result is PRESUMPTIVE POSTIVE SARS-CoV-2 nucleic acids MAY BE PRESENT.   A presumptive positive result was obtained on the submitted specimen  and confirmed on repeat testing.  While 2019 novel coronavirus  (SARS-CoV-2) nucleic acids may be present in the submitted sample  additional  confirmatory testing may be necessary for epidemiological  and / or clinical management purposes  to differentiate between  SARS-CoV-2 and other Sarbecovirus currently known to infect humans.  If clinically indicated additional testing with an alternate test  methodology [phone]) is advised. The SARS-CoV-2 RNA is generally  detectable in upper and lower respiratory sp ecimens during the acute  phase of infection. The expected result is Negative. Fact Sheet for Patients:  StrictlyIdeas.no Fact Sheet for Healthcare Providers: BankingDealers.co.za This test is not yet approved or  cleared by the [place] and has been authorized for detection and/or diagnosis of SARS-CoV-2 by FDA under an Emergency Use Authorization (EUA).  This EUA will remain in effect (meaning this test can be used) for the duration of the COVID-19 declaration under Section 564(b)(1) of the Act, 21 U.S.C. section 360bbb-3(b)(1), unless the authorization is terminated or revoked sooner. Performed at [facility], [place] [address], [place], [place] [address]          Radiology Studies: Ct Chest W Contrast  Result Date: 03/27/2019 CLINICAL DATA:  Abdominal spindle cell sarcoma with known hepatic metastatic disease. Enlarging pulmonary nodules on recent chest radiograph. Vomiting and diarrhea. Weakness. Home oxygen for COPD. Tachycardia. EXAM: CT CHEST, ABDOMEN, AND PELVIS WITH CONTRAST TECHNIQUE: Multidetector CT imaging of the chest, abdomen and pelvis was performed following the standard protocol during bolus administration of intravenous contrast. CONTRAST:  154mL OMNIPAQUE IOHEXOL 300 MG/ML  SOLN COMPARISON:  Multiple exams, including 12/14/2018, 02/17/2017, and chest radiograph from 03/27/2019 FINDINGS: CT CHEST FINDINGS Cardiovascular: Coronary, aortic arch, and branch vessel atherosclerotic vascular disease. Mediastinum/Nodes: Unremarkable  Lungs/Pleura: Scattered pulmonary nodules in the lungs are mostly new compared to 02/17/2017 although several are stable. A new right upper lobe nodule on image 45/6 measures 1.5 by 1.5 cm. A new right lower lobe nodule on image 115/6 measures 2.6 by 2.1 cm. A new left lower lobe nodule on image 70/6 measures 1.8 by 1.5 cm. Multiple additional new nodules are present. Markedly severe emphysema. Musculoskeletal: Lower thoracic spondylosis. CT ABDOMEN PELVIS FINDINGS Hepatobiliary: Prior chemoembolization of a left hepatic lobe lesion with coils noted and a distal left hepatic artery branch, and with a cystic and rim calcified treated lesion measuring 4.8 by 3.7 cm on image 56/2, formerly 5.4 by 4.1 cm on 12/14/2018. Medially in segment 2 of the liver, a 3.7 by 2.8 cm hypodense lesion on image 49/2 was previously 4.8 by 3.9 cm by my measurements, a significant improvement. 1.0 cm gallstone in the gallbladder. Ill-defined potential 2.0 by 1.2 cm lesion in segment 5 of the liver on image 65/2, not well appreciated on prior CT exams, and only vaguely seen today. In the dome of the right hepatic lobe there is a suggestion of a 1.0 cm hyperdense lesion on image 51/2 which could be a small hemangioma but which is technically nonspecific. Pancreas: Unremarkable Spleen: Unremarkable Adrenals/Urinary Tract: 6.6 cm in diameter right mid kidney fluid density cyst. Adrenal glands normal. Urinary bladder unremarkable. Stomach/Bowel: Small-bowel obstruction with dilated and thick-walled loops of small bowel up to 4.4 cm in diameter. Mild associated mesenteric edema along the involved loops. The transition point is along the anterior margin of the large right abdominal tumor which is at least effacing and extrinsically compressing the involved small bowel loop, and invasion of the loop is not excluded. Sigmoid colon diverticulosis. Vascular/Lymphatic: Aortoiliac atherosclerotic vascular disease. Reproductive: Unremarkable Other:  Eccentric to the right in the abdomen, the large abdominal mass currently measures 24.0 by 16.0 by 18.5 cm (volume = 3720 cm^3). There is adjacent ascites in the right lower quadrant, nonspecific for malignant versus bland ascites. Musculoskeletal: Spondylosis and degenerative disc disease at L5-S1 causing bilateral foraminal impingement. IMPRESSION: 1. Small-bowel obstruction due to the large right-sided tumor. Dilated thick-walled loops of small bowel extend to a loop which runs anterior to the tumor. This loop is severely effaced and likely compressed by the tumor, and invasion is not excluded. 2. Large right-sided intra-abdominal tumor currently measures at 3720 cubic  cm, previously 3110 cubic cm on 12/14/2018. 3. Compared to the most recent chest CT from 02/17/2017, there are various scattered new pulmonary nodules measuring up to about 2.6 cm in diameter, compatible with metastatic disease. Several of the pulmonary nodules are chronic but most are new. 4. Further reduction in size of the lateral segment left hepatic lobe lesions, status post prior embolization. However, there is a questionable new 2.0 cm ill-defined lesion in segment 5 of the liver which could be a new metastatic lesion. 5. Markedly severe emphysema.  Emphysema (ICD10-J43.9). 6.  Aortic Atherosclerosis (ICD10-I70.0).  Coronary atherosclerosis. 7. Lumbar impingement L5-S1. 8. Sigmoid colon diverticulosis. Electronically Signed   By: Van Clines M.D.   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 Enlarging pulmonary nodules on recent chest radiograph. Vomiting and diarrhea. Weakness. Home oxygen for COPD. Tachycardia. EXAM: CT CHEST, ABDOMEN, AND PELVIS WITH CONTRAST TECHNIQUE: Multidetector CT imaging of the chest, abdomen and pelvis was performed following the standard protocol during bolus administration of  intravenous contrast. CONTRAST:  181mL OMNIPAQUE IOHEXOL 300 MG/ML  SOLN COMPARISON:  Multiple exams, including 12/14/2018, 02/17/2017, and chest radiograph from 03/27/2019 FINDINGS: CT CHEST FINDINGS Cardiovascular: Coronary, aortic arch, and branch vessel atherosclerotic vascular disease. Mediastinum/Nodes: Unremarkable Lungs/Pleura: Scattered pulmonary nodules in the lungs are mostly new compared to 02/17/2017 although several are stable. A new right upper lobe nodule on image 45/6 measures 1.5 by 1.5 cm. A new right lower lobe nodule on image 115/6 measures 2.6 by 2.1 cm. A new left lower lobe nodule on image 70/6 measures 1.8 by 1.5 cm. Multiple additional new nodules are present. Markedly severe emphysema. Musculoskeletal: Lower thoracic spondylosis. CT ABDOMEN PELVIS FINDINGS Hepatobiliary: Prior chemoembolization of a left hepatic lobe lesion with coils noted and a distal left hepatic artery branch, and with a cystic and rim calcified treated lesion measuring 4.8 by 3.7 cm on image 56/2, formerly 5.4 by 4.1 cm on 12/14/2018. Medially in segment 2 of the liver, a 3.7 by 2.8 cm hypodense lesion on image 49/2 was previously 4.8 by 3.9 cm by my measurements, a significant improvement. 1.0 cm gallstone in the gallbladder. Ill-defined potential 2.0 by 1.2 cm lesion in segment 5 of the liver on image 65/2, not well appreciated on prior CT exams, and only vaguely seen today. In the dome of the right hepatic lobe there is a suggestion of a 1.0 cm hyperdense lesion on image 51/2 which could be a small hemangioma but which is technically nonspecific. Pancreas: Unremarkable Spleen: Unremarkable Adrenals/Urinary Tract: 6.6 cm in diameter right mid kidney fluid density cyst. Adrenal glands normal. Urinary bladder unremarkable. Stomach/Bowel: Small-bowel obstruction with dilated and thick-walled loops of small bowel up to 4.4 cm in diameter. Mild associated mesenteric edema along the involved loops. The transition point is  along the anterior margin of the large right abdominal tumor which is at least effacing and extrinsically compressing the involved small bowel loop, and invasion of the loop is not excluded. Sigmoid colon diverticulosis. Vascular/Lymphatic: Aortoiliac atherosclerotic vascular disease. Reproductive: Unremarkable Other: Eccentric to the right in the abdomen, the large abdominal mass currently measures 24.0 by 16.0 by 18.5 cm (volume = 3720 cm^3). There is adjacent ascites in the right lower quadrant, nonspecific for malignant versus bland ascites. Musculoskeletal: Spondylosis and degenerative disc disease at L5-S1 causing bilateral foraminal impingement. IMPRESSION: 1. Small-bowel obstruction due to the large right-sided tumor. Dilated  thick-walled loops of small bowel extend to a loop which runs anterior to the tumor. This loop is severely effaced and likely compressed by the tumor, and invasion is not excluded. 2. Large right-sided intra-abdominal tumor currently measures at 3720 cubic cm, previously 3110 cubic cm on 12/14/2018. 3. Compared to the most recent chest CT from 02/17/2017, there are various scattered new pulmonary nodules measuring up to about 2.6 cm in diameter, compatible with metastatic disease. Several of the pulmonary nodules are chronic but most are new. 4. Further reduction in size of the lateral segment left hepatic lobe lesions, status post prior embolization. However, there is a questionable new 2.0 cm ill-defined lesion in segment 5 of the liver which could be a new metastatic lesion. 5. Markedly severe emphysema.  Emphysema (ICD10-J43.9). 6.  Aortic Atherosclerosis (ICD10-I70.0).  Coronary atherosclerosis. 7. Lumbar impingement L5-S1. 8. Sigmoid colon diverticulosis. Electronically Signed   By: Van Clines M.D.   On: 03/27/2019 16:18   Dg Chest Portable 1 View  Result Date: 03/27/2019 CLINICAL DATA:  Weakness with nausea and vomiting EXAM: PORTABLE CHEST 1 VIEW COMPARISON:   02/19/2019, CT from 02/17/2017 FINDINGS: Cardiac shadow is stable. Aortic calcifications are noted. The lungs are well aerated bilaterally and demonstrate multiple pulmonary nodules increased from the prior exam. Largest of these lies in the right upper lobe measuring 2.1 cm in greatest dimension. No acute bony abnormality is noted. IMPRESSION: Enlarging pulmonary nodules bilaterally. CT of the chest is recommended for further evaluation. Electronically Signed   By: Inez Catalina M.D.   On: 03/27/2019 12:48        Scheduled Meds:  sodium chloride   Intravenous Once   budesonide (PULMICORT) nebulizer solution  0.5 mg Nebulization BID   ipratropium-albuterol  3 mL Nebulization QID   lisinopril  10 mg Oral Daily   methocarbamol  1,000 mg Oral QHS   pantoprazole  40 mg Oral Q0600   simvastatin  20 mg Oral q1800   sodium chloride flush  10-40 mL Intracatheter Q12H   Continuous Infusions:    LOS: 1 day    Time spent: 35 minutes    Irine Seal, MD [facility]  If 7PM-7AM, please contact night-coverage [email or web address] 03/29/2019, 11:50 AM

## 2019-03-29 NOTE — Progress Notes (Signed)
Daily Progress Note   Patient Name: [name]       Date: 03/29/2019 DOB: 12-28-1945  Age: 73 y.o. MRN#: [id number] Attending Physician: Eugenie Filler, MD Primary Care Physician: [name], MD Admit Date: 03/27/2019  Reason for Consultation/Follow-up: Establishing goals of care  Subjective/GOC: F/u with patient this AM. He complains of intermittent, mild abdominal discomfort from tumor that is relieved with belching. He was hesitant to try prn pain medication, but eventually decided to see if this would provide relief of his pain. RN notified to bring prn tramadol.   Discussed plan of care including diagnoses and interventions. Patient shares his knowledge that his tumor cannot be 'fixed by man.' He is a spiritual individual and places this in God's hands.   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 Emotional support provided.   Addendum 1230: Pt states relief from tramadol and was glad he could take a 45 minute nap. Encouraged continued use of prn tramadol for pain.   Length of Stay: 1  Current Medications: Scheduled Meds:  . sodium chloride   Intravenous Once  . budesonide (PULMICORT) nebulizer solution  0.5 mg Nebulization BID  . ipratropium-albuterol  3 mL Nebulization QID  . lisinopril  10 mg Oral Daily  . methocarbamol  1,000 mg Oral QHS  . pantoprazole  40 mg Oral Q0600  . simvastatin  20 mg Oral q1800  . sodium chloride flush  10-40 mL Intracatheter Q12H    Continuous Infusions: . sodium chloride 75 mL/hr at 03/28/19 0200  . magnesium sulfate bolus IVPB 4 g (03/29/19 0950)    PRN Meds: ALPRAZolam, fluticasone, ipratropium-albuterol, methocarbamol, ondansetron (ZOFRAN) IV, sodium chloride flush,  traMADol  Physical Exam Vitals signs and nursing note reviewed.  Constitutional:      General: He is awake.     Appearance: He is ill-appearing.  HENT:     Head: Normocephalic and atraumatic.  Pulmonary:     Effort: No tachypnea, accessory muscle usage or respiratory distress.     Comments: [place] 6L, occasional pursed lip breathing. Abdominal:     Tenderness: There is abdominal tenderness.     Comments: C/o mild abdominal discomfort. RN instructed to give prn Tramadol  Skin:    General: Skin is warm and dry.  Neurological:     Mental Status: He is alert and oriented to person, place, and time.  Psychiatric:        Mood and Affect: Mood normal.        Speech: Speech normal.        Behavior: Behavior normal.        Cognition and Memory: Cognition normal.              Vital Signs: BP 134/82 (BP Location: Left Arm)   Pulse 84   Temp 98.2 F (36.8 C) (Oral)   Resp 20   Ht 5\' 6"  (1.676 m)   Wt 99.3 kg   SpO2 98%   BMI 35.35 kg/m  SpO2: SpO2: 98 % O2 Device: O2 Device: Nasal Cannula O2 Flow Rate: O2 Flow Rate (L/min): 6 L/min  Intake/output summary:   Intake/Output Summary (Last 24 hours) at 03/29/2019 0956 Last data filed at 03/29/2019 0931 Gross per 24 hour  Intake 3621.41 ml  Output 1675 ml  Net 1946.41 ml   LBM: Last BM Date: 03/28/19 Baseline Weight: Weight: 99.3 kg Most recent weight: Weight: 99.3 kg       Palliative Assessment/Data: PPS 60%    Flowsheet Rows     Most Recent Value  Intake Tab  Referral Department  Hospitalist  Unit at Time of Referral  Med/Surg Unit  Palliative Care Primary Diagnosis  Cancer  Palliative Care Type  Return patient Palliative Care  Reason for referral  Clarify Goals of Care  Date first seen by Palliative Care  03/28/19  Clinical Assessment  Palliative Performance Scale Score  60%  Psychosocial & Spiritual Assessment  Palliative Care Outcomes  Patient/Family meeting held?  Yes  Who was at the meeting?  patient. Spoke  with wife via telephone  Palliative Care Outcomes  Clarified goals of care, Counseled regarding hospice, Improved pain interventions, Improved non-pain symptom therapy, Provided end of life care assistance, Provided advance care planning, Provided psychosocial or spiritual support, ACP counseling assistance, Linked to palliative care logitudinal support      Patient Active Problem List   Diagnosis Date Noted  . Anemia of chronic disease   . OSA (obstructive sleep apnea)   . SBO (small bowel obstruction) ([place]) 03/27/2019  . Nausea vomiting and diarrhea   . Weakness   . SOB (shortness of breath)   . Elevated troponin   . Palliative care by specialist   . Goals of care, counseling/discussion   . Abdominal pain   . GI bleed 02/18/2019  . DNR (do not resuscitate) 12/21/2018  . Anemia 12/14/2018  . Liver metastasis ([place]) 07/13/2018  . Acute blood loss anemia 06/07/2018  . Rectal bleeding 05/13/2018  . Chronic diastolic CHF (congestive heart failure) ([place]) 05/13/2018  . Lower GI bleed 05/13/2018  . Class 2 obesity with body mass index (BMI) of 37.0 to 37.9 in adult 01/10/2018  . 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 Acute on chronic diastolic CHF (congestive heart failure) ([place]) 01/19/2017  . Chronic respiratory failure with hypoxia and hypercapnia ([place]) 10/13/2016  . OSA treated with BiPAP 10/13/2016  . Macrocytic anemia 09/24/2016  . COPD with acute exacerbation ([place]) 09/24/2016  . Acute bronchitis 02/03/2016  . Ex-smoker 10/31/2015  . Dilated cardiomyopathy ([place])   . Pulmonary hypertension ([place])   . Acute on chronic respiratory failure with hypoxia and hypercapnia ([place]) 10/20/2015  . 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 LBP (low back pain) 03/24/2011  . Malignant neoplasm of bladder ([place]) 09/22/2010  .  HEMATURIA UNSPECIFIED 05/14/2010  . Osteoarthritis 03/28/2010  . Coronary atherosclerosis of native coronary artery 12/19/2007  . Acute sinusitis 12/19/2007  . 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 COLONIC POLYPS 12/18/2007  . HYPERCHOLESTEROLEMIA 12/18/2007  . Anxiety 12/18/2007  . Essential hypertension 12/18/2007  . GASTRITIS 12/18/2007  . Impaired fasting glucose 12/18/2007    Palliative Care Assessment & Plan   Patient Profile: 73 y.o. male  with past medical history of metastatic spindle cell carcinoma diagnosed in 2017, COPD on 6L home oxygen, OSA on HS BiPAP, CAD, HLD, HTN, rectal bleeding admitted on 03/27/2019 with abdominal pain/distention and weakness. CT ab/pelvis reveals large right-sided intra-abdominal tumor, small bowel obstruction due to tumor severely effaced/likely compressed by tumor and invasion not excluded, various new pulmonary nodules, and severe emphysema. Surgery consulted. Patient declines surgical interventions. Clear liquid diet initiated. Followed by Dr. Merrie Roof with [facility]. Palliative medicine consultation for goals of care.   Assessment: Metastatic spindle cell sarcoma SBO due to intra-abdominal metastatic tumor Pulmonary nodules Anemia of chronic disease COPD on 6L [place] OSA CAD  Recommendations/Plan:  DNR/DNI. MOST form completed on 5/13.  NO surgical intervention. Continue conservative/symptom management. Tolerating full liquid diet.   Patient desire future blood transfusions if indicated. Patient wishes to continue outpatient palliative services. Not yet ready for hospice services on discharge. PMT provider educated patient and wife on hospice philosophy and options. They understand palliative services can transition to hospice services at any time. Encouraged ongoing discussions regarding EOL wishes. [facility] Choices copy given.   Code Status: DNR/DNI   Code Status Orders  (From admission, onward)         Start     Ordered   03/27/19 1738  Do not attempt resuscitation (DNR)  Continuous    Question Answer Comment  In the event of cardiac or respiratory ARREST Do not call a "code blue"   In the event of  cardiac or respiratory ARREST Do not perform Intubation, CPR, defibrillation or ACLS   In the event of cardiac or respiratory ARREST Use medication by any route, position, wound care, and other measures to relive pain and suffering. May use oxygen, suction and manual treatment of airway obstruction as needed for comfort.      03/27/19 1738        Code Status History    Date Active Date Inactive Code Status Order ID Comments User Context   02/18/2019 1539 02/21/2019 2028 Partial Code [id number]  Kayleen Memos, DO ED   02/18/2019 1527 02/18/2019 1539 DNR [id number]  Kayleen Memos, DO ED   02/18/2019 1519 02/18/2019 1527 Full Code [id number]  Kayleen Memos, DO ED   12/14/2018 2025 12/15/2018 2042 DNR [id number]  Elmarie Shiley, MD Inpatient   06/07/2018 2032 06/12/2018 0045 Full Code [id number]  Etta Quill, DO ED   05/13/2018 2145 05/18/2018 2157 Full Code [id number]  Toy Baker, MD Inpatient   01/19/2017 1957 01/26/2017 1550 Full Code [id number]  Rama, Venetia Maxon, MD ED   09/24/2016 0246 09/29/2016 2000 Full Code [id number]  Vianne Bulls, MD ED   10/20/2015 1934 10/29/2015 1653 Full Code [id number]  Mendel Corning, MD Inpatient    Advance Directive Documentation     Most Recent Value  Type of Advance Directive  Healthcare Power of Attorney, Living will  Pre-existing out of facility DNR order (yellow form or pink MOST form)  -  "MOST" Form in Place?  -       Prognosis:  Poor prognosis with metastatic spindle cell carcinoma with disease progression on CT and small bowel obstruction. High risk for decompensation.   Discharge Planning:  To Be Determined  Care plan was discussed with patient, RN  Thank you for allowing the Palliative Medicine Team to assist in the care of this patient.   Time In: 0910 Time Out: 0950 Total Time 40 Prolonged Time Billed  no      Greater than 50%  of this time was spent counseling and coordinating care related to the above assessment and plan.  Ihor Dow, FNP-C Palliative Medicine Team  Phone: [phone] Fax: [phone]  Please contact Palliative Medicine Team phone at [phone] for questions and concerns.

## 2019-03-30 DIAGNOSIS — C787 Secondary malignant neoplasm of liver and intrahepatic bile duct: Secondary | ICD-10-CM

## 2019-03-30 LAB — BASIC METABOLIC PANEL
Anion gap: 7 (ref 5–15)
BUN: 11 mg/dL (ref 8–23)
CO2: 26 mmol/L (ref 22–32)
Calcium: 8.1 mg/dL — ABNORMAL LOW (ref 8.9–10.3)
Chloride: 100 mmol/L (ref 98–111)
Creatinine, Ser: 0.74 mg/dL (ref 0.61–1.24)
GFR calc Af Amer: >60 mL/min (ref >60)
GFR calc non Af Amer: >60 mL/min (ref >60)
Glucose, Bld: 125 mg/dL — ABNORMAL HIGH (ref 70–99)
Potassium: 4.1 mmol/L (ref 3.5–5.1)
Sodium: 133 mmol/L — ABNORMAL LOW (ref 135–145)

## 2019-03-30 LAB — TYPE AND SCREEN
ABO/RH(D): A POS
Antibody Screen: NEGATIVE
Unit division: 0

## 2019-03-30 LAB — BPAM RBC
Blood Product Expiration Date: 202006012359
ISSUE DATE / TIME: 202005131403
Unit Type and Rh: 6200

## 2019-03-30 LAB — HEMOGLOBIN AND HEMATOCRIT, BLOOD
HCT: 24.7 % — ABNORMAL LOW (ref 39.0–52.0)
Hemoglobin: 7.6 g/dL — ABNORMAL LOW (ref 13.0–17.0)

## 2019-03-30 LAB — MAGNESIUM: Magnesium: 2.2 mg/dL (ref 1.7–2.4)

## 2019-03-30 MED ORDER — FUROSEMIDE 40 MG PO TABS
40.0000 mg | ORAL_TABLET | Freq: Every day | ORAL | Status: DC
  Administered 2019-03-30: 09:00:00 40 mg via ORAL
  Filled 2019-03-30: qty 1

## 2019-03-30 MED ORDER — TRAMADOL HCL 50 MG PO TABS: 50.0000 mg | ORAL_TABLET | Freq: Four times a day (QID) | ORAL | 0 refills | Status: AC | PRN

## 2019-03-30 NOTE — Progress Notes (Signed)
   03/30/19 0900  Clinical Encounter Type  Visited With Patient  Visit Type Initial;Psychological support;Spiritual support  Referral From Nurse  Consult/Referral To Chaplain  Spiritual Encounters  Spiritual Needs Emotional;Other (Comment)  Stress Factors  Patient Stress Factors Health changes;Major life changes   I visited with [name] per spiritual care consult. His main need identified is wanting to go home. [name] said that he "feels better now than before he came in" the hospital. He has a strong faith in God. [name] said that he is worried about some of his lab work, but trusts that God is going to get him through this. [name] relies on prayer for comfort and is a major component of his treatment. It is also getting him through this hospital stay without his wife being able to visit due to covid-19.  [name] had a positive attitude today and appreciated the support.   Please, contact spiritual care for further assistance.   Chaplain Shanon Ace M.Div., [facility]

## 2019-03-30 NOTE — Progress Notes (Signed)
Discharge instructions discussed with patient, verbalized agreement and understanding.  Pt requests a script for Tramadol until he can see his primary care MD, paged to Dr. Grandville Silos stated that he will place script

## 2019-03-30 NOTE — Discharge Summary (Signed)
Physician Discharge Summary  [name] TJQ:[id number] DOB: December 29, 1945 DOA: 03/27/2019  PCP: [name], MD  Admit date: 03/27/2019 Discharge date: 03/30/2019  Time spent: 50 minutes  Recommendations for Outpatient Follow-up:  1. Follow-up with [name], MD in 1 to 2 weeks.  On follow-up patient will need a CBC done to follow-up on his H&H.  Patient also need a basic metabolic profile done to follow-up on electrolytes and renal function. 2. Follow-up with outpatient palliative care services as previously done prior to admission.   Discharge Diagnoses:  Principal Problem:   SBO (small bowel obstruction) (HCC) Active Problems:   End stage COPD (HCC)   OSA treated with BiPAP   Spindle cell carcinoma (HCC)   Liver metastasis (HCC)   Anemia of chronic disease   Discharge Condition: Stable and improved  Diet recommendation: Heart healthy  Filed Weights   03/27/19 1129 03/29/19 1600 03/30/19 0703  Weight: 99.3 kg 98.6 kg 97.8 kg    History of present illness:  Per Dr. Lendon [name] is a 73 y.o. 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  He reports having a bowel movement this morning on the day of admission and moving flatus.  He denied any nausea or vomiting.  However he reported nausea vomiting and diarrhea to the nursing staff on Sunday which is a day prior to this admission.  He has no complaints of fever chills cough.  Patient had another hospital admission for rectal bleed last month.  Patient was nauseous in the ER. He has COPD and is on 6 L of oxygen 24/7.  He is aware his prognosis is guarded and he is agreeable to see palliative care and he is DO NOT RESUSCITATE.  ED Course: CT scan of the abdomen and  pelvis done in the ER shows due to large right-sided tumor, large right-sided intra-abdominal tumor, catheter new pulmonary nodules measuring 2.6 cm in diameter compatible with metastatic disease several of the nodules are chronic but most of them are new reduction in size of the lateral segment of the left hepatic lobe lesion status post prior embolization new 2 cm ill-defined lesion in the segment 5 of the liver which may be a new metastatic lesion, markedly severe emphysema sigmoid colon diverticulosis.  Vital signs 12/12/1966 pulse is 110 respiration 17 saturation 97% on 4 L.  Hospital Course:  1 small bowel obstruction secondary to compression of intra-abdominal metastatic tumor from spindle cell sarcoma Patient noted to have bowel movement the morning of admission.  Patient had presented with nausea.  CT abdomen and pelvis concerning for small bowel obstruction.  Nausea improved.  Patient with no emesis.  Patient seen in consultation by general surgery and is noted that patient with partial small bowel obstruction but tolerating liquids and to advance his diet as tolerated.  Per general surgery no need for surgery at this time and patient made the decision NOT to have any aggressive intervention done at this time.  Patient was initially started on clear liquids which he tolerated, diet advanced to full liquid diet and subsequently a soft diet which he tolerated.  Patient was having bowel movements by day of discharge and did not have any significant abdominal pain.  Patient's electrolytes were repleted.  Outpatient follow-up with PCP.  2.  Anemia of chronic disease Likely secondary to spindle cell carcinoma with mets to the liver and bladder.  Patient with no overt bleeding.  Hemoglobin at 7.3 from 8.2 from 6.8 after 1 unit packed red blood cells.    Hemoglobin remained stable such that by day of discharge hemoglobin was 7.6.  Outpatient follow-up with PCP.  3.  Obstructive sleep apnea Patient  maintained on home regimen of BiPAP nightly.  4.  COPD/severe emphysema/chronic hypoxemic respiratory failure on 6 L nasal cannula home O2. Patient chronically on 6 L nasal cannula.  Patient denied any significant worsening of shortness of breath.  Poor to fair air movement.  Patient resumed back on home regimen of duo nebs.  Titrated O2 down to keep sats between 89 to 93%. Supportive care.  5.  Coronary artery disease Stable.  Patient noted to be tachycardic felt likely secondary to anemia.  Tachycardia improved.  Patient maintained on home regimen of lisinopril, Zocor.    Outpatient follow-up.  6.  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 Palliative care consulted and followed the patient throughout the hospitalization.  Patient was started on some Ultram as needed and prescription was written for 20 tablets until patient able to follow-up with PCP.  Outpatient follow-up with palliative care on discharge.    7.  Mild volume overload Likely secondary to hydration with IV fluids in addition to transfusion of packed red blood cells.    Patient was given Lasix 20 mg IV x1 with good urine output of 1.225 L over 24-hours and patient was placed on oral Lasix 40 mg daily.  Patient improved clinically.  Patient will be discharged home in stable and improved condition with outpatient follow-up.    Procedures:  CT abdomen and pelvis 03/27/2019  CT chest 03/27/2019  Chest x-ray 03/27/2019  Transfusional 1 unit packed red blood cells 03/28/2019.   Consultations:  General surgery: Dr. Lucia Gaskins 03/27/2019  Palliative care: Ihor Dow, NP 03/28/2019  Discharge Exam: Vitals:   03/30/19 0805 03/30/19 1150  BP:    Pulse:    Resp:    Temp:    SpO2: 98% 98%    General: NAD Cardiovascular: RRR Respiratory: Poor air movement.  Clear to auscultation bilaterally.  No wheezes, no crackles, no rhonchi.  Discharge  Instructions   Discharge Instructions    Diet - low sodium heart healthy   Complete by:  As directed    Increase activity slowly   Complete by:  As directed      Allergies as of 03/30/2019      Reactions   Amlodipine Swelling      Medication List    TAKE these medications   acetaminophen 325 MG tablet Commonly known as:  TYLENOL Take 2 tablets (650 mg total) by mouth every 6 (six) hours as needed for mild pain (or Fever >/= 101).   Albuterol Sulfate 108 (90 Base) MCG/ACT Aepb Commonly known as:  ProAir RespiClick Inhale 2 puffs into the lungs every 6 (six) hours as needed (shortness of breath).   ALPRAZolam 0.5 MG tablet Commonly known as:  XANAX Take 1 tablet (0.5 mg total) by mouth at bedtime.   budesonide 0.25 MG/2ML nebulizer solution Commonly known as:  PULMICORT Take 2 mLs (0.25 mg total) by nebulization 2 (two) times daily.   cholecalciferol 10 MCG (400 UNIT) Tabs tablet Commonly known as:  VITAMIN D3 Take 400 Units by  mouth at bedtime.   FLAX SEEDS PO Take 1 capsule by mouth daily.   fluticasone 50 MCG/ACT nasal spray Commonly known as:  FLONASE Place 2 sprays into both nostrils daily as needed for allergies or rhinitis.   furosemide 40 MG tablet Commonly known as:  LASIX Take 40 mg by mouth daily as needed for fluid.   ipratropium-albuterol 0.5-2.5 (3) MG/3ML Soln Commonly known as:  DUONEB Take 3 mLs by nebulization every 4 (four) hours as needed. What changed:  when to take this   Iron 325 (65 Fe) MG Tabs Take 325 mg by mouth daily.   lisinopril 10 MG tablet Commonly known as:  ZESTRIL Take 10 mg by mouth daily.   methocarbamol 500 MG tablet Commonly known as:  ROBAXIN Take 1,000 mg by mouth at bedtime.   multivitamin-lutein Caps capsule Take 1 capsule by mouth 2 (two) times a day.   nitroGLYCERIN 0.4 MG SL tablet Commonly known as:  NITROSTAT Place 1 tablet (0.4 mg total) under the tongue every 5 (five) minutes as needed for chest  pain.   pantoprazole 40 MG tablet Commonly known as:  PROTONIX Take 40 mg by mouth daily. What changed:  Another medication with the same name was removed. Continue taking this medication, and follow the directions you see here.   simvastatin 20 MG tablet Commonly known as:  ZOCOR Take 1 tablet (20 mg total) by mouth daily at 6 PM.   sodium chloride 0.65 % Soln nasal spray Commonly known as:  OCEAN Place 1 spray into both nostrils as needed for congestion. What changed:  when to take this   SYSTANE OP Apply 1 drop to eye daily as needed (dry eyes).   traMADol 50 MG tablet Commonly known as:  ULTRAM Take 1 tablet (50 mg total) by mouth every 6 (six) hours as needed for moderate pain.   vitamin B-12 1000 MCG tablet Commonly known as:  CYANOCOBALAMIN Take 1,000 mcg by mouth daily.   vitamin C 1000 MG tablet Take 1,000 mg by mouth daily.   Votrient 200 MG tablet Generic drug:  pazopanib      Allergies  Allergen Reactions  . Amlodipine Swelling   Follow-up Information    [name], MD. Schedule an appointment as soon as possible for a visit.   Specialty:  Internal Medicine Why:  F/U IN 1-2 WEEKS. Contact information: [address] [place] [address] [phone]            The results of significant diagnostics from this hospitalization (including imaging, microbiology, ancillary and laboratory) are listed below for reference.    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 Enlarging pulmonary nodules on recent chest radiograph. Vomiting and diarrhea. Weakness. Home oxygen for COPD. Tachycardia. EXAM: CT CHEST, ABDOMEN, AND PELVIS WITH CONTRAST TECHNIQUE: Multidetector CT imaging of the chest, abdomen and pelvis was performed following the standard protocol during bolus administration of intravenous contrast. CONTRAST:  137mL OMNIPAQUE  IOHEXOL 300 MG/ML  SOLN COMPARISON:  Multiple exams, including 12/14/2018, 02/17/2017, and chest radiograph from 03/27/2019 FINDINGS: CT CHEST FINDINGS Cardiovascular: Coronary, aortic arch, and branch vessel atherosclerotic vascular disease. Mediastinum/Nodes: Unremarkable Lungs/Pleura: Scattered pulmonary nodules in the lungs are mostly new compared to 02/17/2017 although several are stable. A new right upper lobe nodule on image 45/6 measures 1.5 by 1.5 cm. A new right lower lobe nodule on image 115/6 measures 2.6 by 2.1 cm.  A new left lower lobe nodule on image 70/6 measures 1.8 by 1.5 cm. Multiple additional new nodules are present. Markedly severe emphysema. Musculoskeletal: Lower thoracic spondylosis. CT ABDOMEN PELVIS FINDINGS Hepatobiliary: Prior chemoembolization of a left hepatic lobe lesion with coils noted and a distal left hepatic artery branch, and with a cystic and rim calcified treated lesion measuring 4.8 by 3.7 cm on image 56/2, formerly 5.4 by 4.1 cm on 12/14/2018. Medially in segment 2 of the liver, a 3.7 by 2.8 cm hypodense lesion on image 49/2 was previously 4.8 by 3.9 cm by my measurements, a significant improvement. 1.0 cm gallstone in the gallbladder. Ill-defined potential 2.0 by 1.2 cm lesion in segment 5 of the liver on image 65/2, not well appreciated on prior CT exams, and only vaguely seen today. In the dome of the right hepatic lobe there is a suggestion of a 1.0 cm hyperdense lesion on image 51/2 which could be a small hemangioma but which is technically nonspecific. Pancreas: Unremarkable Spleen: Unremarkable Adrenals/Urinary Tract: 6.6 cm in diameter right mid kidney fluid density cyst. Adrenal glands normal. Urinary bladder unremarkable. Stomach/Bowel: Small-bowel obstruction with dilated and thick-walled loops of small bowel up to 4.4 cm in diameter. Mild associated mesenteric edema along the involved loops. The transition point is along the anterior margin of the large right  abdominal tumor which is at least effacing and extrinsically compressing the involved small bowel loop, and invasion of the loop is not excluded. Sigmoid colon diverticulosis. Vascular/Lymphatic: Aortoiliac atherosclerotic vascular disease. Reproductive: Unremarkable Other: Eccentric to the right in the abdomen, the large abdominal mass currently measures 24.0 by 16.0 by 18.5 cm (volume = 3720 cm^3). There is adjacent ascites in the right lower quadrant, nonspecific for malignant versus bland ascites. Musculoskeletal: Spondylosis and degenerative disc disease at L5-S1 causing bilateral foraminal impingement. IMPRESSION: 1. Small-bowel obstruction due to the large right-sided tumor. Dilated thick-walled loops of small bowel extend to a loop which runs anterior to the tumor. This loop is severely effaced and likely compressed by the tumor, and invasion is not excluded. 2. Large right-sided intra-abdominal tumor currently measures at 3720 cubic cm, previously 3110 cubic cm on 12/14/2018. 3. Compared to the most recent chest CT from 02/17/2017, there are various scattered new pulmonary nodules measuring up to about 2.6 cm in diameter, compatible with metastatic disease. Several of the pulmonary nodules are chronic but most are new. 4. Further reduction in size of the lateral segment left hepatic lobe lesions, status post prior embolization. However, there is a questionable new 2.0 cm ill-defined lesion in segment 5 of the liver which could be a new metastatic lesion. 5. Markedly severe emphysema.  Emphysema (ICD10-J43.9). 6.  Aortic Atherosclerosis (ICD10-I70.0).  Coronary atherosclerosis. 7. Lumbar impingement L5-S1. 8. Sigmoid colon diverticulosis. Electronically Signed   By: Van Clines M.D.   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 Enlarging pulmonary nodules on recent chest  radiograph. Vomiting and diarrhea. Weakness. Home oxygen for COPD. Tachycardia. EXAM: CT CHEST, ABDOMEN, AND PELVIS WITH CONTRAST TECHNIQUE: Multidetector CT imaging of the chest, abdomen and pelvis was performed following the standard protocol during bolus administration of intravenous contrast. CONTRAST:  179mL OMNIPAQUE IOHEXOL 300 MG/ML  SOLN COMPARISON:  Multiple exams, including 12/14/2018, 02/17/2017, and chest radiograph from 03/27/2019 FINDINGS: CT CHEST FINDINGS Cardiovascular: Coronary, aortic arch, and branch vessel atherosclerotic vascular disease. Mediastinum/Nodes: Unremarkable Lungs/Pleura: Scattered pulmonary nodules  in the lungs are mostly new compared to 02/17/2017 although several are stable. A new right upper lobe nodule on image 45/6 measures 1.5 by 1.5 cm. A new right lower lobe nodule on image 115/6 measures 2.6 by 2.1 cm. A new left lower lobe nodule on image 70/6 measures 1.8 by 1.5 cm. Multiple additional new nodules are present. Markedly severe emphysema. Musculoskeletal: Lower thoracic spondylosis. CT ABDOMEN PELVIS FINDINGS Hepatobiliary: Prior chemoembolization of a left hepatic lobe lesion with coils noted and a distal left hepatic artery branch, and with a cystic and rim calcified treated lesion measuring 4.8 by 3.7 cm on image 56/2, formerly 5.4 by 4.1 cm on 12/14/2018. Medially in segment 2 of the liver, a 3.7 by 2.8 cm hypodense lesion on image 49/2 was previously 4.8 by 3.9 cm by my measurements, a significant improvement. 1.0 cm gallstone in the gallbladder. Ill-defined potential 2.0 by 1.2 cm lesion in segment 5 of the liver on image 65/2, not well appreciated on prior CT exams, and only vaguely seen today. In the dome of the right hepatic lobe there is a suggestion of a 1.0 cm hyperdense lesion on image 51/2 which could be a small hemangioma but which is technically nonspecific. Pancreas: Unremarkable Spleen: Unremarkable Adrenals/Urinary Tract: 6.6 cm in diameter right mid  kidney fluid density cyst. Adrenal glands normal. Urinary bladder unremarkable. Stomach/Bowel: Small-bowel obstruction with dilated and thick-walled loops of small bowel up to 4.4 cm in diameter. Mild associated mesenteric edema along the involved loops. The transition point is along the anterior margin of the large right abdominal tumor which is at least effacing and extrinsically compressing the involved small bowel loop, and invasion of the loop is not excluded. Sigmoid colon diverticulosis. Vascular/Lymphatic: Aortoiliac atherosclerotic vascular disease. Reproductive: Unremarkable Other: Eccentric to the right in the abdomen, the large abdominal mass currently measures 24.0 by 16.0 by 18.5 cm (volume = 3720 cm^3). There is adjacent ascites in the right lower quadrant, nonspecific for malignant versus bland ascites. Musculoskeletal: Spondylosis and degenerative disc disease at L5-S1 causing bilateral foraminal impingement. IMPRESSION: 1. Small-bowel obstruction due to the large right-sided tumor. Dilated thick-walled loops of small bowel extend to a loop which runs anterior to the tumor. This loop is severely effaced and likely compressed by the tumor, and invasion is not excluded. 2. Large right-sided intra-abdominal tumor currently measures at 3720 cubic cm, previously 3110 cubic cm on 12/14/2018. 3. Compared to the most recent chest CT from 02/17/2017, there are various scattered new pulmonary nodules measuring up to about 2.6 cm in diameter, compatible with metastatic disease. Several of the pulmonary nodules are chronic but most are new. 4. Further reduction in size of the lateral segment left hepatic lobe lesions, status post prior embolization. However, there is a questionable new 2.0 cm ill-defined lesion in segment 5 of the liver which could be a new metastatic lesion. 5. Markedly severe emphysema.  Emphysema (ICD10-J43.9). 6.  Aortic Atherosclerosis (ICD10-I70.0).  Coronary atherosclerosis. 7. Lumbar  impingement L5-S1. 8. Sigmoid colon diverticulosis. Electronically Signed   By: Van Clines M.D.   On: 03/27/2019 16:18   Dg Chest Portable 1 View  Result Date: 03/27/2019 CLINICAL DATA:  Weakness with nausea and vomiting EXAM: PORTABLE CHEST 1 VIEW COMPARISON:  02/19/2019, CT from 02/17/2017 FINDINGS: Cardiac shadow is stable. Aortic calcifications are noted. The lungs are well aerated bilaterally and demonstrate multiple pulmonary nodules increased from the prior exam. Largest of these lies in the right upper lobe measuring 2.1 cm in greatest  dimension. No acute bony abnormality is noted. IMPRESSION: Enlarging pulmonary nodules bilaterally. CT of the chest is recommended for further evaluation. Electronically Signed   By: Inez Catalina M.D.   On: 03/27/2019 12:48    Microbiology: Recent Results (from the past 240 hour(s))  SARS Coronavirus 2 (CEPHEID - Performed in [facility] hospital lab), Hosp Order     Status: None   Collection Time: 03/27/19 12:25 PM  Result Value Ref Range Status   SARS Coronavirus 2 NEGATIVE NEGATIVE Final    Comment: (NOTE) If result is NEGATIVE SARS-CoV-2 target nucleic acids are NOT DETECTED. The SARS-CoV-2 RNA is generally detectable in upper and lower  respiratory specimens during the acute phase of infection. The lowest  concentration of SARS-CoV-2 viral copies this assay can detect is 250  copies / mL. A negative result does not preclude SARS-CoV-2 infection  and should not be used as the sole basis for treatment or other  patient management decisions.  A negative result may occur with  improper specimen collection / handling, submission of specimen other  than nasopharyngeal swab, presence of viral mutation(s) within the  areas targeted by this assay, and inadequate number of viral copies  (<250 copies / mL). A negative result must be combined with clinical  observations, patient history, and epidemiological information. If result is  POSITIVE SARS-CoV-2 target nucleic acids are DETECTED. The SARS-CoV-2 RNA is generally detectable in upper and lower  respiratory specimens dur ing the acute phase of infection.  Positive  results are indicative of active infection with SARS-CoV-2.  Clinical  correlation with patient history and other diagnostic information is  necessary to determine patient infection status.  Positive results do  not rule out bacterial infection or co-infection with other viruses. If result is PRESUMPTIVE POSTIVE SARS-CoV-2 nucleic acids MAY BE PRESENT.   A presumptive positive result was obtained on the submitted specimen  and confirmed on repeat testing.  While 2019 novel coronavirus  (SARS-CoV-2) nucleic acids may be present in the submitted sample  additional confirmatory testing may be necessary for epidemiological  and / or clinical management purposes  to differentiate between  SARS-CoV-2 and other Sarbecovirus currently known to infect humans.  If clinically indicated additional testing with an alternate test  methodology [id number]) is advised. The SARS-CoV-2 RNA is generally  detectable in upper and lower respiratory sp ecimens during the acute  phase of infection. The expected result is Negative. Fact Sheet for Patients:  StrictlyIdeas.no Fact Sheet for Healthcare Providers: BankingDealers.co.za This test is not yet approved or cleared by the [place] FDA and has been authorized for detection and/or diagnosis of SARS-CoV-2 by FDA under an Emergency Use Authorization (EUA).  This EUA will remain in effect (meaning this test can be used) for the duration of the COVID-19 declaration under Section 564(b)(1) of the Act, 21 U.S.C. section 360bbb-3(b)(1), unless the authorization is terminated or revoked sooner. Performed at [facility], [place] [address] [place], [place] [address]      Labs: Basic Metabolic Panel: Recent  Labs  Lab 03/27/19 1225 03/28/19 0458 03/29/19 0333 03/30/19 0431  NA 135 136 136 133*  K 4.9 3.9 3.9 4.1  CL 103 104 104 100  CO2 24 25 24 26   GLUCOSE 134* 128* 134* 125*  BUN 11 10 8 11   CREATININE 0.76 0.83 0.76 0.74  CALCIUM 8.5* 8.1* 8.1* 8.1*  MG  --   --  1.7 2.2   Liver Function Tests: Recent Labs  Lab  03/27/19 1225 03/28/19 0458  AST 43* 29  ALT 31 27  ALKPHOS 66 57  BILITOT 2.1* 1.9*  PROT 6.8 5.8*  ALBUMIN 3.5 3.0*   No results for input(s): LIPASE, AMYLASE in the last 168 hours. No results for input(s): AMMONIA in the last 168 hours. CBC: Recent Labs  Lab 03/27/19 1225 03/28/19 0458 03/28/19 2025 03/29/19 0333 03/29/19 1355 03/30/19 0431  WBC 5.1 4.1  --  3.4*  --   --   HGB 8.1* 6.8* 8.2* 7.3* 8.4* 7.6*  HCT 26.2* 22.1* 25.9* 23.7* 26.8* 24.7*  MCV 98.9 98.2  --  95.2  --   --   PLT 166 155  --  142*  --   --    Cardiac Enzymes: No results for input(s): CKTOTAL, CKMB, CKMBINDEX, TROPONINI in the last 168 hours. BNP: BNP (last 3 results) Recent Labs    02/18/19 1215  BNP 215.3*    ProBNP (last 3 results) No results for input(s): PROBNP in the last 8760 hours.  CBG: No results for input(s): GLUCAP in the last 168 hours.     Signed:  Irine Seal MD.  [facility] 03/30/2019, 2:38 PM

## 2019-03-30 NOTE — Care Management Important Message (Signed)
Important Message  Patient Details IM Letter given to [name] SW to present to the Patient Name: [name] MRN: [id number] Date of Birth: [birth year]   Medicare Important Message Given:  Yes    [name] 03/30/2019, 10:34 AM

## 2019-04-02 ENCOUNTER — Telehealth: Payer: Self-pay | Admitting: *Deleted

## 2019-04-02 NOTE — Telephone Encounter (Signed)
Contacted and spoke with patient's wife, [name] to arrange a home visit this week s/p hospital discharge. Palliative Care visit scheduled for 04/03/19 at [facility].

## 2019-04-03 ENCOUNTER — Other Ambulatory Visit: Payer: Self-pay

## 2019-04-03 ENCOUNTER — Other Ambulatory Visit: Payer: Medicare Other | Admitting: *Deleted

## 2019-04-03 DIAGNOSIS — Z515 Encounter for palliative care: Secondary | ICD-10-CM

## 2019-04-03 NOTE — Progress Notes (Signed)
COMMUNITY PALLIATIVE CARE RN NOTE  PATIENT NAME: [name] DOB: 1946-09-19 MRN: [id number]  PRIMARY CARE PROVIDER: [name], MD  RESPONSIBLE PARTY:  Acct ID - Guarantor Home Phone Work Phone Relationship Acct Type  [id number] [name]  Self P/F     [address], [place], [place] [address]   Covid-19 Pre-screening Negative  PLAN OF CARE and INTERVENTION:  1. ADVANCE CARE PLANNING/GOALS OF CARE: Goal is for patient to remain at home with his wife. He is now a DNR. MOST form indicates Limited Interventions, Antibiotics if indicated, IV fluids if indicated, no feeding tube. 2. PATIENT/CAREGIVER EDUCATION: Reinforced Safe Mobility, Dyspnea Management, Management of N/V 3. DISEASE STATUS: Met with patient and wife in their home. Patient sitting up on the couch awake and alert. Denies pain at this time. He has Tramadol on hand if needed.  He was recently admitted to the hospital with symptoms of nausea/vomiting, diarrhea, abdominal distention and pain. He was given IV fluids and required 1 Unit of PRBCs for a Hgb of 6.7. Patient says that the tumor in his abdomen is now protruding into his small bowels causing come complications. He also has some new pulmonary nodules. During my phone call to arrange visit yesterday, he was experiencing nausea/vomiting with burning sensation in his stomach. He took some over the counter antacids which helped some. Wife said that she has to call his PCP to set up a follow up appointment for him. I recommended that she request Zofran for nausea to see if this would help. During visit today, she advised that the PCP did call in Zofran to the pharmacy and she was able to pick this medication up. He took 2 doses yesterday which was helpful. He had some n/v first thing this am, but was able to eat a light breakfast (grits w/beef broth) a few hours later and has kept this down. He denies nausea at this time. He reports feeling better today. He admits  that he needs to try to drink more fluids, as he doesn't seem to be urinating as much as he did when he was in the hospital. He has a poor appetite, but does try to eat several small items throughout the day. He remains on O2 at 6L/min via [place] during the day and BiPAP during the night. Continues with some dyspnea with exertion and requires frequent rest periods. He also uses his respiratory nebulizers/inhalers as prescribed to help. He remains ambulatory without assistive devices. He does require some assistance with bathing and with his pants, as he has difficulty bending over d/t abdominal distention. He reports having a large BM this morning. Mostly loose, with some small solid/formed pieces. He starting feeling better after this. Information provided regarding hospice. He does not feel that he needs hospice as of yet. Will continue with these discussions as appropriate. He is appreciative of visit and knows to call with any questions/concerns. Will continue to monitor.   HISTORY OF PRESENT ILLNESS:  This is a 73 yo male who resides at home with his wife. He was recently hospitalized from 03/27/19 - 03/30/19. Palliative Care Team continues to follow patient. Will continue to visit monthly and PRN.  CODE STATUS: DNR ADVANCED DIRECTIVES: Y MOST FORM: no PPS: 50%   PHYSICAL EXAM:   VITALS: Today's Vitals   04/03/19 1138  BP: 112/60  Pulse: 94  Resp: 20  Temp: (!) 97 F (36.1 C)  TempSrc: Temporal  SpO2: 97%  PainSc: 0-No pain  LUNGS: decreased breath sounds CARDIAC: Cor RRR EXTREMITIES: No edema SKIN: Exposed skin is dry and intact  NEURO: Alert and oriented x 3, pleasant mood, generalized weakness, ambulatory   (Duration of visit and documentation 75 minutes)    [name], RN BSN

## 2019-04-06 IMAGING — US US THYROID
1 series · 13 of 25 positions shown · non-contrast
Comparison: Chest CT - 02/17/2017

CLINICAL DATA: Incidental on CT. Follow-up thyroid nodules seen on
outside CT.

EXAM:
THYROID ULTRASOUND
TECHNIQUE: Ultrasound examination of the thyroid gland and adjacent soft
tissues was performed.

[Series 1: us thyroid · 0.07mm/px · 13 of 49 slices shown]
[im 1/49]
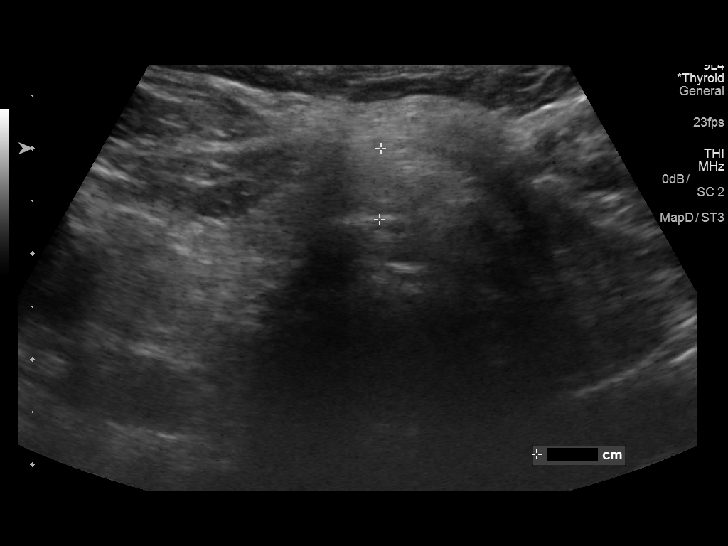
[im 5/49]
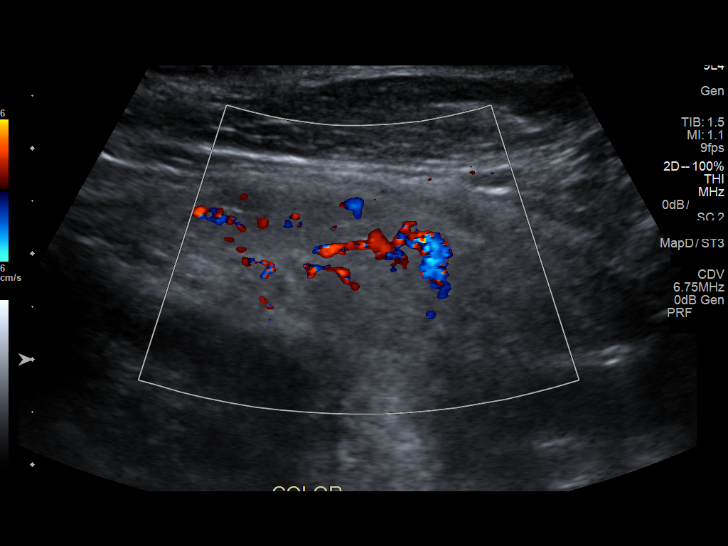
[im 9/49]
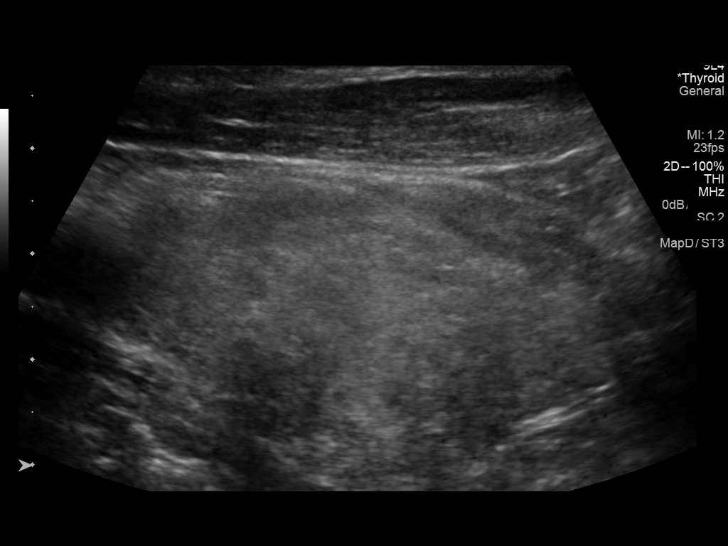
[im 13/49]
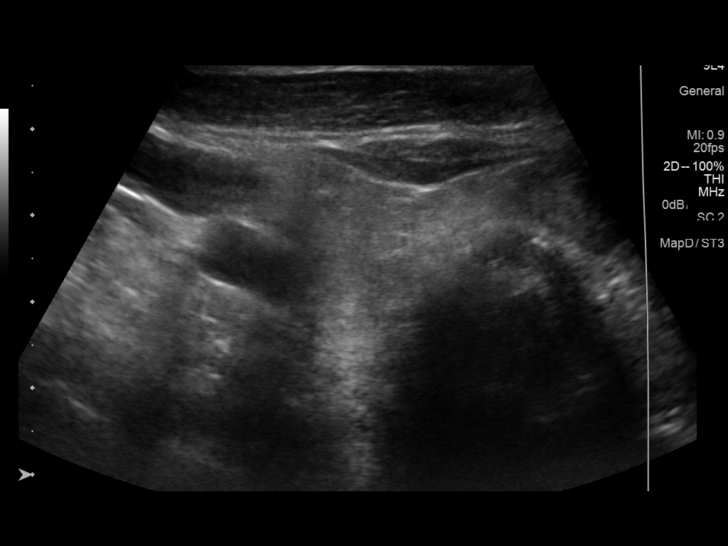
[im 17/49]
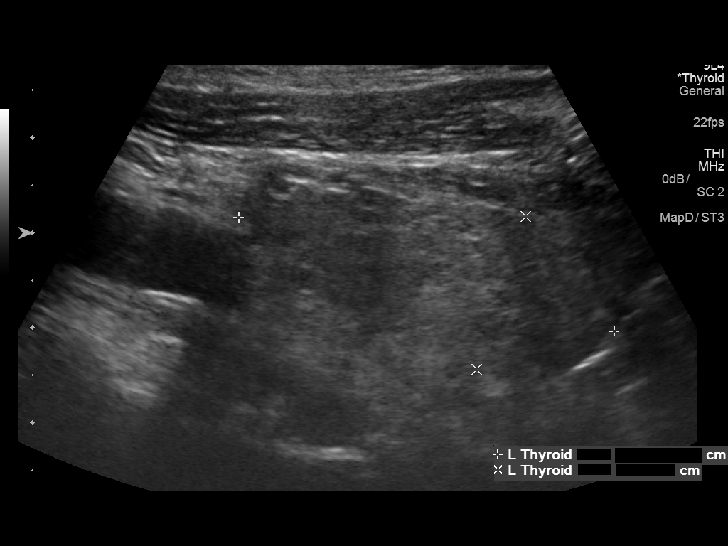
[im 21/49]
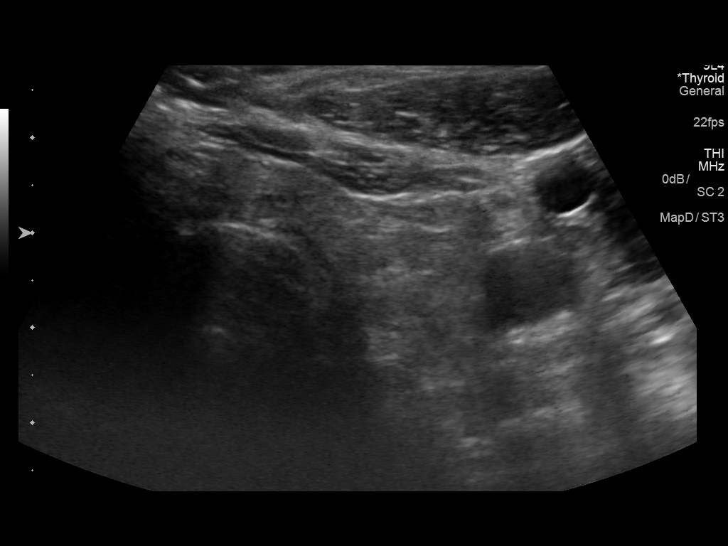
[im 25/49]
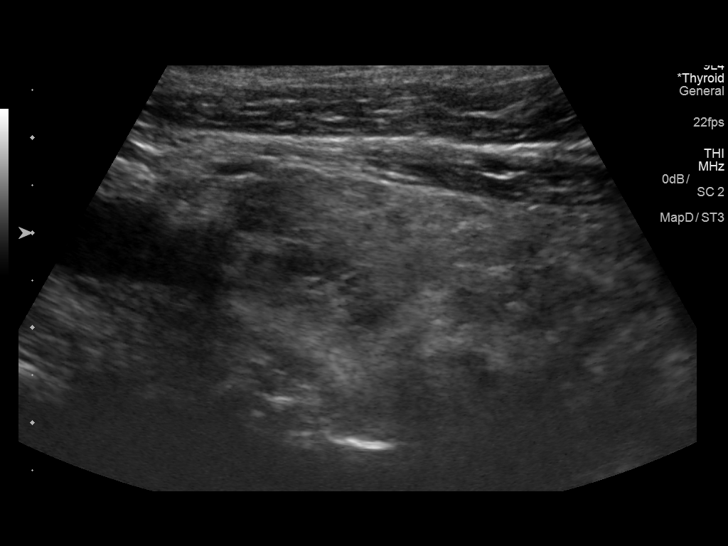
[im 29/49]
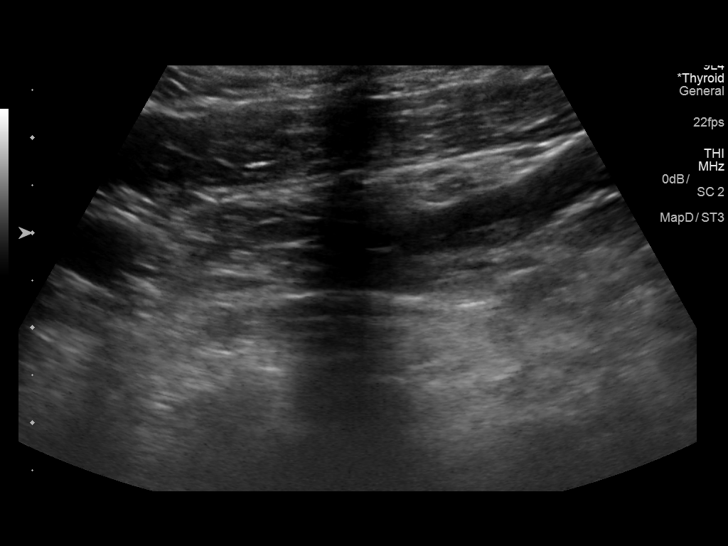
[im 33/49]
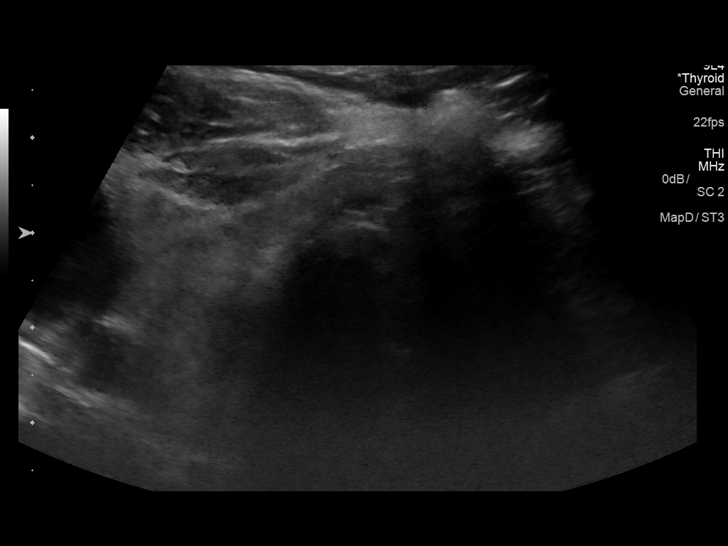
[im 37/49]
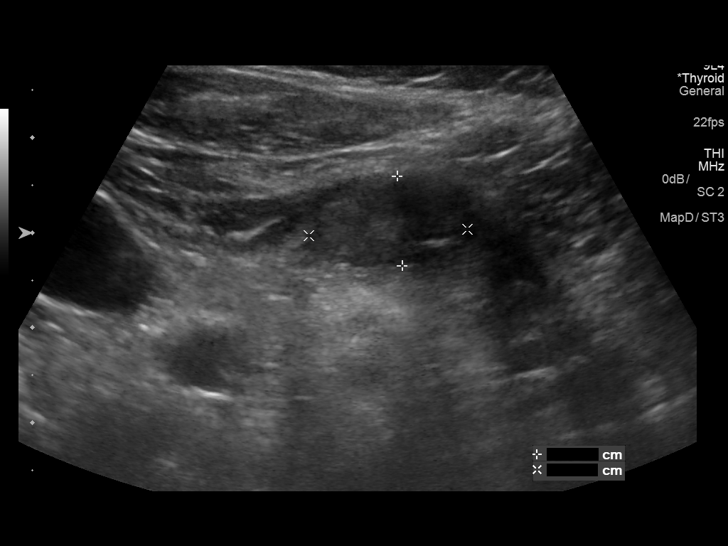
[im 41/49]
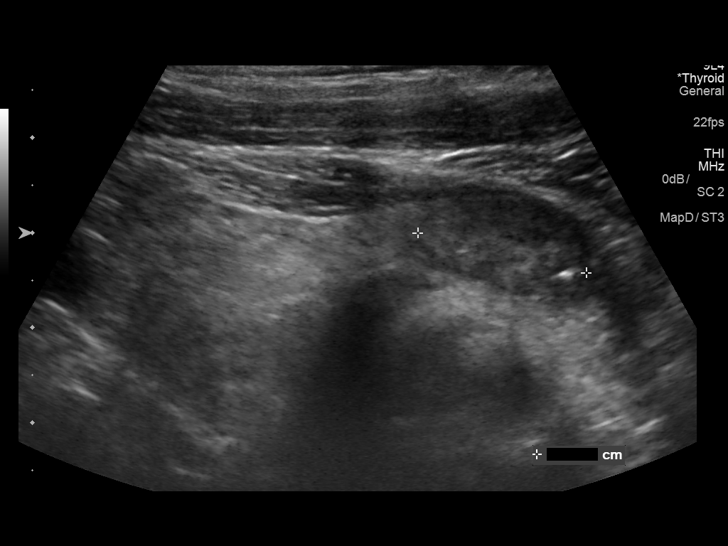
[im 45/49]
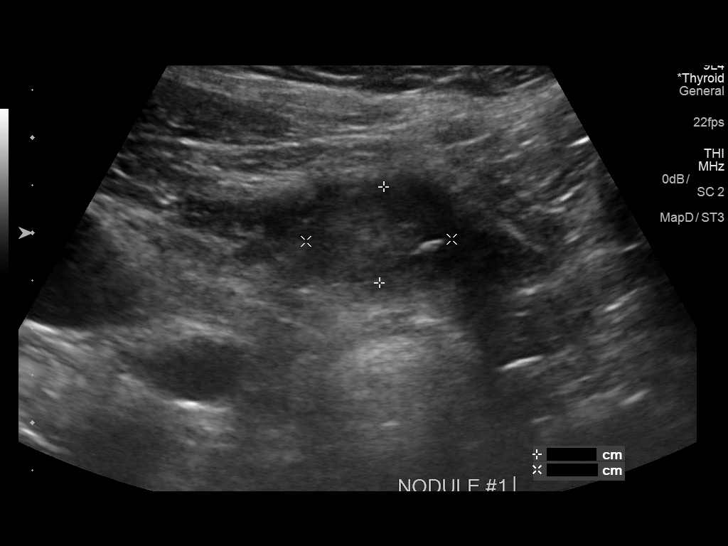
[im 49/49]
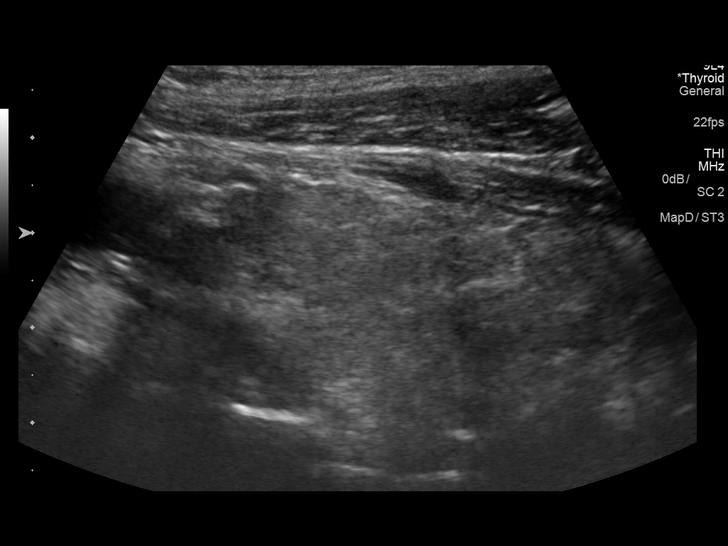

[13 of 25 positions shown; findings below may reference images not displayed]

FINDINGS: Parenchymal Echotexture: Moderately heterogenous

Isthmus: Normal in size measures 0.7 cm in diameter

Right lobe: Normal in size measuring 4.6 x 2.3 x 1.7 cm

Left lobe: Nya size measuring 4 x 1 x 1 x 1.6 cm

_________________________________________________________

Estimated total number of nodules >/= 1 cm: 1

Number of spongiform nodules >/=  2 cm not described below (TR1): 0

Number of mixed cystic and solid nodules >/= 1.5 cm not described
below (TR2): 0

_________________________________________________________

Nodule # 1:

Location: Isthmus; Mid - this nodule correlates with the nodule seen
preceding chest CT

Maximum size: 1.8 cm; Other 2 dimensions: 1.7 x 0.9 cm

Composition: solid/almost completely solid (2)

Echogenicity: hypoechoic (2)

Shape: not taller-than-wide (0)

Margins: smooth (0)

Echogenic foci: macrocalcifications (1)

ACR TI-RADS total points: 5.

ACR TI-RADS risk category: TR4 (4-6 points).

ACR TI-RADS recommendations:

**Given size (>/= 1.5 cm) and appearance, fine needle aspiration of
this moderately suspicious nodule should be considered based on
TI-RADS criteria.

_________________________________________________________
IMPRESSION: Solitary approximately 1.8 cm nodule within the isthmus correlating
with the nodule seen on preceding chest CT, meets imaging criteria
to recommend percutaneous sampling as clinically indicated

The above is in keeping with the ACR TI-RADS recommendations - [HOSPITAL] 6270;[DATE].

## 2019-04-10 DIAGNOSIS — G4733 Obstructive sleep apnea (adult) (pediatric): Secondary | ICD-10-CM | POA: Diagnosis not present

## 2019-04-10 DIAGNOSIS — F419 Anxiety disorder, unspecified: Secondary | ICD-10-CM | POA: Diagnosis not present

## 2019-04-10 DIAGNOSIS — C499 Malignant neoplasm of connective and soft tissue, unspecified: Secondary | ICD-10-CM | POA: Diagnosis not present

## 2019-04-10 DIAGNOSIS — D62 Acute posthemorrhagic anemia: Secondary | ICD-10-CM | POA: Diagnosis not present

## 2019-04-10 DIAGNOSIS — J449 Chronic obstructive pulmonary disease, unspecified: Secondary | ICD-10-CM | POA: Diagnosis not present

## 2019-04-10 DIAGNOSIS — I2510 Atherosclerotic heart disease of native coronary artery without angina pectoris: Secondary | ICD-10-CM | POA: Diagnosis not present

## 2019-04-10 DIAGNOSIS — E8770 Fluid overload, unspecified: Secondary | ICD-10-CM | POA: Diagnosis not present

## 2019-04-10 DIAGNOSIS — K56609 Unspecified intestinal obstruction, unspecified as to partial versus complete obstruction: Secondary | ICD-10-CM | POA: Diagnosis not present

## 2019-04-13 DIAGNOSIS — D62 Acute posthemorrhagic anemia: Secondary | ICD-10-CM | POA: Diagnosis not present

## 2019-04-16 ENCOUNTER — Other Ambulatory Visit (HOSPITAL_COMMUNITY): Payer: Self-pay | Admitting: *Deleted

## 2019-04-17 ENCOUNTER — Other Ambulatory Visit: Payer: Self-pay

## 2019-04-17 ENCOUNTER — Ambulatory Visit (HOSPITAL_COMMUNITY)
Admission: RE | Admit: 2019-04-17 | Discharge: 2019-04-17 | Disposition: A | Discharge status: Home / Self Care | Payer: Medicare Other | Source: Ambulatory Visit | Attending: Internal Medicine | Admitting: Internal Medicine

## 2019-04-17 DIAGNOSIS — D649 Anemia, unspecified: Secondary | ICD-10-CM | POA: Insufficient documentation

## 2019-04-17 LAB — PREPARE RBC (CROSSMATCH)

## 2019-04-17 MED ORDER — FUROSEMIDE 10 MG/ML IJ SOLN
INTRAMUSCULAR | Status: AC
  Filled 2019-04-17: qty 2

## 2019-04-17 MED ORDER — SODIUM CHLORIDE 0.9% IV SOLUTION: Freq: Once | INTRAVENOUS | Status: DC

## 2019-04-17 MED ORDER — FUROSEMIDE 10 MG/ML IJ SOLN
20.0000 mg | Freq: Once | INTRAMUSCULAR | Status: AC
  Administered 2019-04-17: 20 mg via INTRAVENOUS

## 2019-04-18 LAB — BPAM RBC
Blood Product Expiration Date: 202006182359
ISSUE DATE / TIME: 202006020958
Unit Type and Rh: 6200

## 2019-04-18 LAB — TYPE AND SCREEN
ABO/RH(D): A POS
Antibody Screen: NEGATIVE
Unit division: 0

## 2019-04-20 ENCOUNTER — Encounter: Payer: Self-pay | Admitting: Adult Health

## 2019-04-20 ENCOUNTER — Ambulatory Visit (INDEPENDENT_AMBULATORY_CARE_PROVIDER_SITE_OTHER): Payer: Medicare Other | Admitting: Adult Health

## 2019-04-20 ENCOUNTER — Other Ambulatory Visit: Payer: Self-pay

## 2019-04-20 DIAGNOSIS — C799 Secondary malignant neoplasm of unspecified site: Secondary | ICD-10-CM | POA: Diagnosis not present

## 2019-04-20 DIAGNOSIS — J9611 Chronic respiratory failure with hypoxia: Secondary | ICD-10-CM

## 2019-04-20 DIAGNOSIS — J449 Chronic obstructive pulmonary disease, unspecified: Secondary | ICD-10-CM | POA: Diagnosis not present

## 2019-04-20 NOTE — Progress Notes (Signed)
Virtual Visit via Telephone Note  I connected with [name] on 04/20/19 at 10:30 AM EDT by telephone and verified that I am speaking with the correct person using two identifiers.  Location: Patient: Home Provider: Office   I discussed the limitations, risks, security and privacy concerns of performing an evaluation and management service by telephone and the availability of in person appointments. I also discussed with the patient that there may be a patient responsible charge related to this service. The patient expressed understanding and agreed to proceed.   History of Present Illness: Today tele-visit is a follow-up for COPD and recent hospitalization  73 year old male with COPD Gold 3, chronic diastolic heart failure, chronic hypoxic respiratory failure on home oxygen.  Patient is on oxygen 4 L at baseline and 6 L with activity.  He is on nocturnal BiPAP. 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  He is followed at [facility] .   He has underwent a TACE procedure for liver metastasis.  He also has hypertension and coronary artery disease with previous stents.  Patient has underlying COPD and remains on DuoNeb 4 times daily and budesonide twice daily.  (Intolerant to [employer] in past ) .  Patient says overall breathing is getting progressively worse gets winded with minimal activity.  Does have shortness of breath at rest. Patient was on oxygen 4 L at rest and 6 L with activity.  He is on BiPAP at bedtime with oxygen.  He says he wears his BiPAP every night.  Never misses a night.  Feels somewhat rested.  But is generally weak and deconditioned.  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   CT abdomen and pelvis showed a large right-sided tumor new pulmonary nodules and ? new liver lesion.  Patient was seen by general surgery.  Felt surgery was not indicated.  And was given bowel rest and a slow advancement of clear liquid diet.  Symptoms slowly improved prior to discharge.  Patient has chronic anemia and hemoglobin trended down during hospitalization requiring 1 unit of packed red blood cells.  Patient is now requiring 6 L of oxygen previously was on 4 L at rest but now cannot be on anything lower than 6 L to keep O2 saturations greater than 88 to 90%.  Palliative care is following patient at home.  Patient says that he has not had a follow-up with oncology for some time.  Does have some chemo tablets at home but has not started this.  He is under the understanding that he does have gotten oncology at [facility] and has not received this call.  I did urge him to reach out to oncology for follow-up of this.  Also discussed community resources such as hospice to help with symptom management.  Him and his wife declined this at this time. Since discharge patient says he is very weak. Got 1 unit of PRBC last week due to ongoing symptomatic anemia   Observations/Objective: CT abdomen and pelvis, chest Mar 27, 2019 showed small bowel obstruction due to large right-sided tumor.  Large right-sided intra-abdominal tumor currently measuring 24 cm.  New scattered pulmonary nodules largest measuring 2.6 cm.  Possible new liver lesion severe emphysema  Assessment and Plan: Severe  COPD and emphysema-appears to be currently stable however increased symptom burden with underlying metastatic-pulmonary involvement.   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  Patient declines hospice currently.  Have encouraged him to reach out to oncology at [facility] for evaluation and discussion of plan of treatment.  Plan  Patient Instructions  Continue Budesonide Neb Twice daily   Continue on Duoneb Four times a day  .  Continue on Oxygen 6 l/m.  Continue on BIPAP At bedtime  with oxygen .  Please contact Oncology to set up a follow up appointment.   Follow in 2 months with Dr . Valeta Harms or  NP and As needed    Please contact office for sooner follow up if symptoms do not improve or worsen or seek emergency care       Follow Up Instructions: Follow up with in 2 months and As needed      I discussed the assessment and treatment plan with the patient. The patient was provided an opportunity to ask questions and all were answered. The patient agreed with the plan and demonstrated an understanding of the instructions.   The patient was advised to call back or seek an in-person evaluation if the symptoms worsen or if the condition fails to improve as anticipated.  I provided 35  minutes of non-face-to-face time during this encounter.   Rexene Edison, NP

## 2019-04-20 NOTE — Patient Instructions (Addendum)
Continue Budesonide Neb Twice daily   Continue on Duoneb Four times a day  .  Continue on Oxygen 6 l/m.  Continue on BIPAP At bedtime  with oxygen .  Please contact Oncology to set up a follow up appointment.   Follow in 2 months with Dr . Valeta Harms or Cashae Weich NP and As needed    Please contact office for sooner follow up if symptoms do not improve or worsen or seek emergency care

## 2019-04-22 NOTE — Progress Notes (Signed)
PCCM: Agree. This needs to be directed by his oncology group at [facility].  Thanks for speaking with him [facility] 04/22/2019 11:15 AM

## 2019-04-23 ENCOUNTER — Encounter (HOSPITAL_COMMUNITY): Payer: Self-pay

## 2019-04-23 ENCOUNTER — Other Ambulatory Visit: Payer: Self-pay

## 2019-04-23 ENCOUNTER — Observation Stay (HOSPITAL_COMMUNITY)
Admission: EM | Admit: 2019-04-23 | Discharge: 2019-04-24 | Disposition: A | Discharge status: Home / Self Care | Payer: Medicare Other | Attending: Family Medicine | Admitting: Family Medicine

## 2019-04-23 DIAGNOSIS — E7800 Pure hypercholesterolemia, unspecified: Secondary | ICD-10-CM | POA: Insufficient documentation

## 2019-04-23 DIAGNOSIS — C679 Malignant neoplasm of bladder, unspecified: Secondary | ICD-10-CM | POA: Diagnosis not present

## 2019-04-23 DIAGNOSIS — E669 Obesity, unspecified: Secondary | ICD-10-CM | POA: Insufficient documentation

## 2019-04-23 DIAGNOSIS — C787 Secondary malignant neoplasm of liver and intrahepatic bile duct: Secondary | ICD-10-CM | POA: Insufficient documentation

## 2019-04-23 DIAGNOSIS — M1990 Unspecified osteoarthritis, unspecified site: Secondary | ICD-10-CM | POA: Diagnosis not present

## 2019-04-23 DIAGNOSIS — Z66 Do not resuscitate: Secondary | ICD-10-CM | POA: Insufficient documentation

## 2019-04-23 DIAGNOSIS — Z1159 Encounter for screening for other viral diseases: Secondary | ICD-10-CM | POA: Insufficient documentation

## 2019-04-23 DIAGNOSIS — D62 Acute posthemorrhagic anemia: Principal | ICD-10-CM | POA: Diagnosis present

## 2019-04-23 DIAGNOSIS — F419 Anxiety disorder, unspecified: Secondary | ICD-10-CM | POA: Diagnosis not present

## 2019-04-23 DIAGNOSIS — K5730 Diverticulosis of large intestine without perforation or abscess without bleeding: Secondary | ICD-10-CM | POA: Insufficient documentation

## 2019-04-23 DIAGNOSIS — I2510 Atherosclerotic heart disease of native coronary artery without angina pectoris: Secondary | ICD-10-CM | POA: Diagnosis not present

## 2019-04-23 DIAGNOSIS — C7800 Secondary malignant neoplasm of unspecified lung: Secondary | ICD-10-CM | POA: Insufficient documentation

## 2019-04-23 DIAGNOSIS — D649 Anemia, unspecified: Secondary | ICD-10-CM

## 2019-04-23 DIAGNOSIS — R7303 Prediabetes: Secondary | ICD-10-CM | POA: Insufficient documentation

## 2019-04-23 DIAGNOSIS — Z87891 Personal history of nicotine dependence: Secondary | ICD-10-CM | POA: Insufficient documentation

## 2019-04-23 DIAGNOSIS — K922 Gastrointestinal hemorrhage, unspecified: Secondary | ICD-10-CM | POA: Diagnosis present

## 2019-04-23 DIAGNOSIS — Z8249 Family history of ischemic heart disease and other diseases of the circulatory system: Secondary | ICD-10-CM | POA: Insufficient documentation

## 2019-04-23 DIAGNOSIS — R109 Unspecified abdominal pain: Secondary | ICD-10-CM | POA: Diagnosis not present

## 2019-04-23 DIAGNOSIS — R001 Bradycardia, unspecified: Secondary | ICD-10-CM | POA: Diagnosis not present

## 2019-04-23 DIAGNOSIS — I110 Hypertensive heart disease with heart failure: Secondary | ICD-10-CM | POA: Diagnosis not present

## 2019-04-23 DIAGNOSIS — C801 Malignant (primary) neoplasm, unspecified: Secondary | ICD-10-CM

## 2019-04-23 DIAGNOSIS — G4733 Obstructive sleep apnea (adult) (pediatric): Secondary | ICD-10-CM | POA: Insufficient documentation

## 2019-04-23 DIAGNOSIS — K862 Cyst of pancreas: Secondary | ICD-10-CM | POA: Insufficient documentation

## 2019-04-23 DIAGNOSIS — R0602 Shortness of breath: Secondary | ICD-10-CM | POA: Diagnosis present

## 2019-04-23 DIAGNOSIS — J439 Emphysema, unspecified: Secondary | ICD-10-CM | POA: Insufficient documentation

## 2019-04-23 DIAGNOSIS — R000 Tachycardia, unspecified: Secondary | ICD-10-CM | POA: Diagnosis not present

## 2019-04-23 DIAGNOSIS — R58 Hemorrhage, not elsewhere classified: Secondary | ICD-10-CM | POA: Diagnosis not present

## 2019-04-23 DIAGNOSIS — K8020 Calculus of gallbladder without cholecystitis without obstruction: Secondary | ICD-10-CM | POA: Diagnosis not present

## 2019-04-23 DIAGNOSIS — I700 Atherosclerosis of aorta: Secondary | ICD-10-CM | POA: Diagnosis not present

## 2019-04-23 DIAGNOSIS — J449 Chronic obstructive pulmonary disease, unspecified: Secondary | ICD-10-CM | POA: Diagnosis present

## 2019-04-23 DIAGNOSIS — R188 Other ascites: Secondary | ICD-10-CM | POA: Insufficient documentation

## 2019-04-23 DIAGNOSIS — I5032 Chronic diastolic (congestive) heart failure: Secondary | ICD-10-CM | POA: Diagnosis present

## 2019-04-23 DIAGNOSIS — C499 Malignant neoplasm of connective and soft tissue, unspecified: Secondary | ICD-10-CM | POA: Diagnosis not present

## 2019-04-23 DIAGNOSIS — I959 Hypotension, unspecified: Secondary | ICD-10-CM | POA: Diagnosis not present

## 2019-04-23 DIAGNOSIS — M5137 Other intervertebral disc degeneration, lumbosacral region: Secondary | ICD-10-CM | POA: Insufficient documentation

## 2019-04-23 DIAGNOSIS — Z79899 Other long term (current) drug therapy: Secondary | ICD-10-CM | POA: Diagnosis not present

## 2019-04-23 DIAGNOSIS — E1165 Type 2 diabetes mellitus with hyperglycemia: Secondary | ICD-10-CM | POA: Diagnosis not present

## 2019-04-23 DIAGNOSIS — K56609 Unspecified intestinal obstruction, unspecified as to partial versus complete obstruction: Secondary | ICD-10-CM | POA: Insufficient documentation

## 2019-04-23 DIAGNOSIS — Z7951 Long term (current) use of inhaled steroids: Secondary | ICD-10-CM | POA: Diagnosis not present

## 2019-04-23 DIAGNOSIS — I10 Essential (primary) hypertension: Secondary | ICD-10-CM | POA: Diagnosis not present

## 2019-04-23 DIAGNOSIS — R1030 Lower abdominal pain, unspecified: Secondary | ICD-10-CM | POA: Diagnosis not present

## 2019-04-23 LAB — CBC
HCT: 15.7 % — ABNORMAL LOW (ref 39.0–52.0)
Hemoglobin: 4.9 g/dL — CL (ref 13.0–17.0)
MCH: 30.6 pg (ref 26.0–34.0)
MCHC: 31.2 g/dL (ref 30.0–36.0)
MCV: 98.1 fL (ref 80.0–100.0)
Platelets: 195 10*3/uL (ref 150–400)
RBC: 1.6 MIL/uL — ABNORMAL LOW (ref 4.22–5.81)
RDW: 22.1 % — ABNORMAL HIGH (ref 11.5–15.5)
WBC: 7.2 10*3/uL (ref 4.0–10.5)
nRBC: 0.4 % — ABNORMAL HIGH (ref 0.0–0.2)

## 2019-04-23 LAB — COMPREHENSIVE METABOLIC PANEL
ALT: 54 U/L — ABNORMAL HIGH (ref 0–44)
AST: 63 U/L — ABNORMAL HIGH (ref 15–41)
Albumin: 2.9 g/dL — ABNORMAL LOW (ref 3.5–5.0)
Alkaline Phosphatase: 112 U/L (ref 38–126)
Anion gap: 10 (ref 5–15)
BUN: 15 mg/dL (ref 8–23)
CO2: 23 mmol/L (ref 22–32)
Calcium: 8.1 mg/dL — ABNORMAL LOW (ref 8.9–10.3)
Chloride: 98 mmol/L (ref 98–111)
Creatinine, Ser: 0.94 mg/dL (ref 0.61–1.24)
GFR calc Af Amer: >60 mL/min (ref >60)
GFR calc non Af Amer: >60 mL/min (ref >60)
Glucose, Bld: 126 mg/dL — ABNORMAL HIGH (ref 70–99)
Potassium: 4 mmol/L (ref 3.5–5.1)
Sodium: 131 mmol/L — ABNORMAL LOW (ref 135–145)
Total Bilirubin: 1.1 mg/dL (ref 0.3–1.2)
Total Protein: 6.5 g/dL (ref 6.5–8.1)

## 2019-04-23 LAB — TROPONIN I: Troponin I: 0.4 ng/mL (ref <0.03)

## 2019-04-23 LAB — PREPARE RBC (CROSSMATCH)

## 2019-04-23 LAB — SARS CORONAVIRUS 2 BY RT PCR (HOSPITAL ORDER, PERFORMED IN ~~LOC~~ HOSPITAL LAB): SARS Coronavirus 2: NEGATIVE

## 2019-04-23 MED ORDER — IPRATROPIUM-ALBUTEROL 0.5-2.5 (3) MG/3ML IN SOLN: 3.0000 mL | RESPIRATORY_TRACT | Status: DC | PRN

## 2019-04-23 MED ORDER — SALINE SPRAY 0.65 % NA SOLN: 1.0000 | Freq: Every day | NASAL | Status: DC | PRN

## 2019-04-23 MED ORDER — ONDANSETRON HCL 4 MG/2ML IJ SOLN: 4.0000 mg | Freq: Four times a day (QID) | INTRAMUSCULAR | Status: DC | PRN

## 2019-04-23 MED ORDER — ACETAMINOPHEN 325 MG PO TABS: 650.0000 mg | ORAL_TABLET | Freq: Four times a day (QID) | ORAL | Status: DC | PRN

## 2019-04-23 MED ORDER — SIMVASTATIN 10 MG PO TABS
20.0000 mg | ORAL_TABLET | Freq: Every day | ORAL | Status: DC
  Administered 2019-04-23 – 2019-04-24 (×2): 20 mg via ORAL
  Filled 2019-04-23 (×2): qty 2

## 2019-04-23 MED ORDER — VITAMIN C 500 MG PO TABS
1000.0000 mg | ORAL_TABLET | Freq: Every day | ORAL | Status: DC
  Administered 2019-04-24: 1000 mg via ORAL
  Filled 2019-04-23: qty 2

## 2019-04-23 MED ORDER — PANTOPRAZOLE SODIUM 40 MG PO TBEC
40.0000 mg | DELAYED_RELEASE_TABLET | Freq: Every day | ORAL | Status: DC
  Administered 2019-04-24: 40 mg via ORAL
  Filled 2019-04-23: qty 1

## 2019-04-23 MED ORDER — FUROSEMIDE 40 MG PO TABS
40.0000 mg | ORAL_TABLET | Freq: Every day | ORAL | Status: DC
  Administered 2019-04-24: 40 mg via ORAL
  Filled 2019-04-23: qty 1

## 2019-04-23 MED ORDER — TRAMADOL HCL 50 MG PO TABS
50.0000 mg | ORAL_TABLET | Freq: Four times a day (QID) | ORAL | Status: DC | PRN
  Administered 2019-04-23: 50 mg via ORAL
  Filled 2019-04-23 (×2): qty 1

## 2019-04-23 MED ORDER — VITAMIN B-12 1000 MCG PO TABS
1000.0000 ug | ORAL_TABLET | Freq: Every day | ORAL | Status: DC
  Administered 2019-04-24: 1000 ug via ORAL
  Filled 2019-04-23: qty 1

## 2019-04-23 MED ORDER — POLYVINYL ALCOHOL 1.4 % OP SOLN
Freq: Every day | OPHTHALMIC | Status: DC | PRN
  Filled 2019-04-23: qty 15

## 2019-04-23 MED ORDER — ONDANSETRON HCL 4 MG PO TABS: 4.0000 mg | ORAL_TABLET | Freq: Four times a day (QID) | ORAL | Status: DC | PRN

## 2019-04-23 MED ORDER — FUROSEMIDE 10 MG/ML IJ SOLN
20.0000 mg | Freq: Once | INTRAMUSCULAR | Status: AC
  Administered 2019-04-23: 20 mg via INTRAVENOUS
  Filled 2019-04-23: qty 2

## 2019-04-23 MED ORDER — BUDESONIDE 0.25 MG/2ML IN SUSP
0.2500 mg | Freq: Two times a day (BID) | RESPIRATORY_TRACT | Status: DC
  Administered 2019-04-23 – 2019-04-24 (×2): 0.25 mg via RESPIRATORY_TRACT
  Filled 2019-04-23 (×2): qty 2

## 2019-04-23 MED ORDER — NITROGLYCERIN 0.4 MG SL SUBL: 0.4000 mg | SUBLINGUAL_TABLET | SUBLINGUAL | Status: DC | PRN

## 2019-04-23 MED ORDER — FLUTICASONE PROPIONATE 50 MCG/ACT NA SUSP: 2.0000 | Freq: Every day | NASAL | Status: DC | PRN

## 2019-04-23 MED ORDER — METHOCARBAMOL 500 MG PO TABS
1000.0000 mg | ORAL_TABLET | Freq: Every day | ORAL | Status: DC
  Administered 2019-04-23: 1000 mg via ORAL
  Filled 2019-04-23: qty 2

## 2019-04-23 MED ORDER — IPRATROPIUM-ALBUTEROL 0.5-2.5 (3) MG/3ML IN SOLN: 3.0000 mL | Freq: Four times a day (QID) | RESPIRATORY_TRACT | Status: DC

## 2019-04-23 MED ORDER — IPRATROPIUM-ALBUTEROL 0.5-2.5 (3) MG/3ML IN SOLN
RESPIRATORY_TRACT | Status: AC
  Filled 2019-04-23: qty 3

## 2019-04-23 MED ORDER — CHOLECALCIFEROL 10 MCG (400 UNIT) PO TABS
400.0000 [IU] | ORAL_TABLET | Freq: Every day | ORAL | Status: DC
  Administered 2019-04-23: 400 [IU] via ORAL
  Filled 2019-04-23: qty 1

## 2019-04-23 MED ORDER — SODIUM CHLORIDE 0.9% IV SOLUTION
Freq: Once | INTRAVENOUS | Status: AC
  Administered 2019-04-23: 12:00:00 via INTRAVENOUS

## 2019-04-23 MED ORDER — MORPHINE SULFATE (PF) 2 MG/ML IV SOLN: 1.0000 mg | INTRAVENOUS | Status: DC | PRN

## 2019-04-23 MED ORDER — ACETAMINOPHEN 650 MG RE SUPP: 650.0000 mg | Freq: Four times a day (QID) | RECTAL | Status: DC | PRN

## 2019-04-23 MED ORDER — ALPRAZOLAM 0.5 MG PO TABS
0.5000 mg | ORAL_TABLET | Freq: Every day | ORAL | Status: DC
  Administered 2019-04-23: 0.5 mg via ORAL
  Filled 2019-04-23: qty 2

## 2019-04-23 MED ORDER — IPRATROPIUM-ALBUTEROL 0.5-2.5 (3) MG/3ML IN SOLN
3.0000 mL | Freq: Four times a day (QID) | RESPIRATORY_TRACT | Status: DC
  Administered 2019-04-23 – 2019-04-24 (×5): 3 mL via RESPIRATORY_TRACT
  Filled 2019-04-23 (×4): qty 3

## 2019-04-23 NOTE — ED Notes (Signed)
Patient changed into gown. Belongings at bedside.

## 2019-04-23 NOTE — ED Notes (Signed)
ED TO INPATIENT HANDOFF REPORT  ED Nurse Name and Phone #: Lattie Haw, RN   S Name/Age/Gender [name] 73 y.o. male Room/Bed: WA03/WA03  Code Status   Code Status: Prior  Home/SNF/Other Home Patient oriented to: self, place, time and situation Is this baseline? Yes   Triage Complete: Triage complete  Chief Complaint weakness; low HGB  Triage Note Pt arrived via EMS from home. Pt had a GI bleed 2 weeks ago and was given a transfusion. Pt reports that his PCP stated that his HGB was low about a week. Pt reports that Sat, he began feeling weak, pt reports that he had a BM today and was dark in color. Pt is on continuous O2@ 6 lpm. Pt is hyptn on arrival. Pt was taken Chemo 3 weeks ago, and has not been compliant.   BP 98/60 cbg 256  O2 sat 100% , HR 80's    Allergies Allergies  Allergen Reactions  . Amlodipine Swelling    Level of Care/Admitting Diagnosis ED Disposition    ED Disposition Condition [facility] Area: [place] [[id number]]  Level of Care: Telemetry [5]  Admit to tele based on following criteria: Other see comments  Comments: severe anemia  Covid Evaluation: Screening Protocol (No Symptoms)  Diagnosis: GI bleed [366440]  Admitting Physician: RAI, [place] K [[id number]]  Attending Physician: RAI, RIPUDEEP K [[id number]]  PT Class (Do Not Modify): Observation [104]  PT Acc Code (Do Not Modify): Observation [10022]       B Medical/Surgery History Past Medical History:  Diagnosis Date  . Anxiety   . Bladder cancer ([place]) 2011  . Borderline diabetes mellitus   . CAD (coronary artery disease)   . Cigarette smoker   . COPD (chronic obstructive pulmonary disease) ([place])   . DJD (degenerative joint disease)   . Emphysema of lung ([place])   . Epistaxis   . History of colonic polyps 2004   hyperplastic   . History of sinusitis   . Hypercholesteremia   . Hypertension   . Obesity    Past Surgical History:  Procedure Laterality  Date  . cataract surgery  septemeber 2013  . cataract surgery/sub posterior vitrectomy in left eye  1996   Dr. Zadie Rhine  . PTCA  [phone]     A IV Location/Drains/Wounds Patient Lines/Drains/Airways Status   Active Line/Drains/Airways    Name:   Placement date:   Placement time:   Site:   Days:   Peripheral IV 04/23/19 Right Hand   04/23/19    1132    Hand   less than 1   Peripheral IV 04/23/19 Left Antecubital   04/23/19    1219    Antecubital   less than 1   External Urinary Catheter   04/23/19    1256    -   less than 1          Intake/Output Last 24 hours No intake or output data in the 24 hours ending 04/23/19 1426  Labs/Imaging Results for orders placed or performed during the hospital encounter of 04/23/19 (from the past 48 hour(s))  CBC     Status: Abnormal   Collection Time: 04/23/19 11:35 AM  Result Value Ref Range   WBC 7.2 4.0 - 10.5 K/uL   RBC 1.60 (L) 4.22 - 5.81 MIL/uL   Hemoglobin 4.9 (LL) 13.0 - 17.0 g/dL    Comment: This critical result has verified and been called to Charlton Haws. RN by  Raelyn Ensign on 06 08 2020 at 1203, and has been read back. CRITICAL RESULT VERIFIED   HCT 15.7 (L) 39.0 - 52.0 %   MCV 98.1 80.0 - 100.0 fL   MCH 30.6 26.0 - 34.0 pg   MCHC 31.2 30.0 - 36.0 g/dL   RDW 22.1 (H) 11.5 - 15.5 %   Platelets 195 150 - 400 K/uL   nRBC 0.4 (H) 0.0 - 0.2 %    Comment: Performed at [facility], [place] [address], [place], [place] [address]  Comprehensive metabolic panel     Status: Abnormal   Collection Time: 04/23/19 11:35 AM  Result Value Ref Range   Sodium 131 (L) 135 - 145 mmol/L   Potassium 4.0 3.5 - 5.1 mmol/L   Chloride 98 98 - 111 mmol/L   CO2 23 22 - 32 mmol/L   Glucose, Bld 126 (H) 70 - 99 mg/dL   BUN 15 8 - 23 mg/dL   Creatinine, Ser 0.94 0.61 - 1.24 mg/dL   Calcium 8.1 (L) 8.9 - 10.3 mg/dL   Total Protein 6.5 6.5 - 8.1 g/dL   Albumin 2.9 (L) 3.5 - 5.0 g/dL   AST 63 (H) 15 - 41 U/L   ALT 54 (H) 0 - 44 U/L    Alkaline Phosphatase 112 38 - 126 U/L   Total Bilirubin 1.1 0.3 - 1.2 mg/dL   GFR calc non Af Amer >60 >60 mL/min   GFR calc Af Amer >60 >60 mL/min   Anion gap 10 5 - 15    Comment: Performed at [facility], [place] [address] [place], [place] [address]  Type and screen [place]     Status: None (Preliminary result)   Collection Time: 04/23/19 11:35 AM  Result Value Ref Range   ABO/RH(D) A POS    Antibody Screen NEG    Sample Expiration 04/26/2019,2359    Unit Number [id number]    Blood Component Type RED CELLS,LR    Unit division 00    Status of Unit ISSUED    Transfusion Status OK TO TRANSFUSE    Crossmatch Result      Compatible Performed at [facility], [place] [address], [place], [place] [address]    Unit Number [id number]    Blood Component Type RED CELLS,LR    Unit division 00    Status of Unit ALLOCATED    Transfusion Status OK TO TRANSFUSE    Crossmatch Result Compatible    Unit Number [id number]    Blood Component Type RED CELLS,LR    Unit division 00    Status of Unit ALLOCATED    Transfusion Status OK TO TRANSFUSE    Crossmatch Result Compatible   Troponin I - Add-On to previous collection     Status: Abnormal   Collection Time: 04/23/19 11:35 AM  Result Value Ref Range   Troponin I 0.40 (HH) <0.03 ng/mL    Comment: CRITICAL RESULT CALLED TO, READ BACK BY AND VERIFIED WITH: HALL,C. RN AT 1313 04/23/19 MULLINS,T Performed at [facility], [place] [address] [place], [place] [address]   Prepare RBC     Status: None   Collection Time: 04/23/19 12:08 PM  Result Value Ref Range   Order Confirmation      ORDER PROCESSED BY BLOOD BANK Performed at [facility], [place] [address] [place], [place] [address]    No results found.  Pending Labs [employer] (From admission, onward)  Start     Ordered   Sport and exercise psychologist morning,    R     Signed and Held   Signed and Held  CBC  Tomorrow morning,   R     Signed and Held          Vitals/Pain Today's Vitals   04/23/19 1300 04/23/19 1303 04/23/19 1315 04/23/19 1410  BP: 104/66 114/69 (!) 106/58 (!) 112/96  Pulse: (!) 103 (!) 103 (!) 105 (!) 104  Resp: (!) 21 (!) 21 (!) 22 (!) 29  Temp:  98.3 F (36.8 C)    TempSrc:  Oral    SpO2: 100% 100% 100% 100%  PainSc:        Isolation Precautions No active isolations  Medications Medications  furosemide (LASIX) injection 20 mg (has no administration in time range)  0.9 %  sodium chloride infusion (Manually program via Guardrails IV Fluids) ( Intravenous New Bag/Given 04/23/19 1223)    Mobility walks with person assist Low fall risk   Focused Assessments Neuro Assessment Handoff:  Swallow screen pass? Yes  Cardiac Rhythm: Normal sinus rhythm       Neuro Assessment:   Neuro Checks:      Last Documented NIHSS Modified Score:   Has TPA been given? No If patient is a Neuro Trauma and patient is going to OR before floor call report to [place] nurse: [phone] or [phone]     R Recommendations: See Admitting Provider Note  Report given to:   Additional Notes: Report given.

## 2019-04-23 NOTE — ED Notes (Signed)
Attempted to call report to Lattie Haw, Therapist, sports. She is not ready yet and will call this RN back.

## 2019-04-23 NOTE — ED Notes (Signed)
Patient signed consent form and aware of risks and benefits of a blood transfusion.

## 2019-04-23 NOTE — ED Notes (Signed)
Hospitilist at bedside.  

## 2019-04-23 NOTE — ED Notes (Signed)
Bed: WA03 Expected date:  Expected time:  Means of arrival:  Comments: EMS 73 yo m hgb <7, weakness, hypotensive

## 2019-04-23 NOTE — Plan of Care (Signed)

## 2019-04-23 NOTE — ED Notes (Signed)
Date and time results received: 04/23/19 1:14 PM  (use smartphrase ".now" to insert current time)  Test: troponin Critical Value:0.40  Name of Provider Notified: Logan RN   Orders Received? Or Actions Taken?:

## 2019-04-23 NOTE — ED Notes (Signed)
Date and time results received: 04/23/19 12:04 PM  (use smartphrase ".now" to insert current time)  Test: Hgb Critical Value: 4.1  Name of Provider Notified: Venora Maples  Orders Received? Or Actions Taken?: awaiting orders

## 2019-04-23 NOTE — ED Provider Notes (Signed)
[facility] DEPT Provider Note   CSN: [id number] Arrival date & time: 04/23/19  1101    History   Chief Complaint Chief Complaint  Patient presents with  . Weakness    Low HGB check     HPI [name] is a 73 y.o. male.     HPI Patient is a 73 year old male with a history of spindle cell carcinoma presents the emergency department with complaints of generalized weakness and feeling like he needs a blood transfusion.  He has had dark stools over the past several days but reports that they are becoming lighter again.  He feels weak and tired and is having some chest tightness.  No palpitations.  His last blood transfusion was several days ago in which she received 1 unit packed red blood cells.  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  He is requesting blood transfusion if he is anemic today.  He knows that he has a poor prognosis from his spindle cell and does not want any surgical resection.    Past Medical History:  Diagnosis Date  . Anxiety   . Bladder cancer ([place]) 2011  . Borderline diabetes mellitus   . CAD (coronary artery disease)   . Cigarette smoker   . COPD (chronic obstructive pulmonary disease) ([place])   . DJD (degenerative joint disease)   . Emphysema of lung ([place])   . Epistaxis   . History of colonic polyps 2004   hyperplastic   . History of sinusitis   . Hypercholesteremia   . Hypertension   . Obesity     Patient Active Problem List   Diagnosis Date Noted  . Anemia of chronic disease   . OSA (obstructive sleep apnea)   . SBO (small bowel obstruction) ([place]) 03/27/2019  . Nausea vomiting and diarrhea   . Weakness   . SOB (shortness of breath)   . Elevated troponin   . Palliative care by specialist   . Goals of care, counseling/discussion   . Abdominal pain   . GI bleed 02/18/2019  . DNR (do not resuscitate)  12/21/2018  . Anemia 12/14/2018  . 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 Acute blood loss anemia 06/07/2018  . Rectal bleeding 05/13/2018  . Chronic diastolic CHF (congestive heart failure) ([place]) 05/13/2018  . Lower GI bleed 05/13/2018  . Class 2 obesity with body mass index (BMI) of 37.0 to 37.9 in adult 01/10/2018  . Spindle cell carcinoma ([place]) 06/07/2017  . Acute on chronic diastolic CHF (congestive heart failure) ([place]) 01/19/2017  . Chronic respiratory failure with hypoxia and hypercapnia ([place]) 10/13/2016  . OSA treated with BiPAP 10/13/2016  . Macrocytic anemia 09/24/2016  . COPD with acute exacerbation ([place]) 09/24/2016  . Acute bronchitis 02/03/2016  . Ex-smoker 10/31/2015  . Dilated cardiomyopathy ([place])   . Pulmonary hypertension ([place])   . Acute on chronic respiratory failure with hypoxia and hypercapnia ([place]) 10/20/2015  . 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 LBP (low back pain) 03/24/2011  . Malignant neoplasm of bladder ([place]) 09/22/2010  . HEMATURIA UNSPECIFIED 05/14/2010  . Osteoarthritis 03/28/2010  . Coronary atherosclerosis of native coronary artery 12/19/2007  . Acute sinusitis 12/19/2007  . End stage COPD ([place]) 12/19/2007  . COLONIC POLYPS 12/18/2007  . HYPERCHOLESTEROLEMIA 12/18/2007  . Anxiety 12/18/2007  . Essential hypertension 12/18/2007  . GASTRITIS 12/18/2007  . Impaired fasting glucose 12/18/2007  Past Surgical History:  Procedure Laterality Date  . cataract surgery  septemeber 2013  . cataract surgery/sub posterior vitrectomy in left eye  1996   Dr. Zadie Rhine  . PTCA  [phone]        Home Medications    Prior to Admission medications   Medication Sig Start Date End Date Taking? Authorizing Provider  Albuterol Sulfate (PROAIR RESPICLICK) 867 (90 Base) MCG/ACT AEPB Inhale 2 puffs into the lungs every 6 (six) hours as needed (shortness of breath). 10/26/18   Noralee Space, MD  ALPRAZolam Duanne Moron) 0.5 MG tablet Take 1 tablet (0.5 mg  total) by mouth at bedtime. 10/26/18   Noralee Space, MD  Ascorbic Acid (VITAMIN C) 1000 MG tablet Take 1,000 mg by mouth daily.    [provider]  budesonide (PULMICORT) 0.25 MG/2ML nebulizer solution Take 2 mLs (0.25 mg total) by nebulization 2 (two) times daily. 02/12/19   Garner Nash, DO  cholecalciferol (VITAMIN D) 400 units TABS tablet Take 400 Units by mouth at bedtime.    [provider]  Flaxseed, Linseed, (FLAX SEEDS PO) Take 1 capsule by mouth daily.    [provider]  fluticasone (FLONASE) 50 MCG/ACT nasal spray Place 2 sprays into both nostrils daily as needed for allergies or rhinitis.  09/21/18   [provider]  furosemide (LASIX) 40 MG tablet Take 40 mg by mouth daily as needed for fluid.     [provider]  ipratropium-albuterol (DUONEB) 0.5-2.5 (3) MG/3ML SOLN Take 3 mLs by nebulization every 4 (four) hours as needed. Patient taking differently: Take 3 mLs by nebulization 4 (four) times daily.  02/12/19   Icard, Leory Plowman L, DO  lisinopril (ZESTRIL) 10 MG tablet Take 10 mg by mouth daily.    [provider]  methocarbamol (ROBAXIN) 500 MG tablet Take 1,000 mg by mouth at bedtime.  03/07/18   [provider]  multivitamin-lutein (OCUVITE-LUTEIN) CAPS capsule Take 1 capsule by mouth 2 (two) times a day.     [provider]  nitroGLYCERIN (NITROSTAT) 0.4 MG SL tablet Place 1 tablet (0.4 mg total) under the tongue every 5 (five) minutes as needed for chest pain. 11/19/15   Richardson Dopp T, PA-C  pantoprazole (PROTONIX) 40 MG tablet Take 40 mg by mouth daily. 03/03/19   [provider]  Polyethyl Glycol-Propyl Glycol (SYSTANE OP) Apply 1 drop to eye daily as needed (dry eyes).    [provider]  simvastatin (ZOCOR) 20 MG tablet Take 1 tablet (20 mg total) by mouth daily at 6 PM. 11/19/15   Kathlen Mody, Nicki Reaper T, PA-C  sodium chloride (OCEAN) 0.65 % SOLN nasal spray Place 1 spray into both nostrils as  needed for congestion. Patient taking differently: Place 1 spray into both nostrils daily as needed for congestion.  10/29/15   Cherene Altes, MD  traMADol (ULTRAM) 50 MG tablet Take 1 tablet (50 mg total) by mouth every 6 (six) hours as needed for moderate pain. 03/30/19   Eugenie Filler, MD  vitamin B-12 (CYANOCOBALAMIN) 1000 MCG tablet Take 1,000 mcg by mouth daily.    [provider]  VOTRIENT 200 MG tablet  02/05/19   [provider]    Family History Family History  Problem Relation Age of Onset  . Heart disease Mother   . Heart disease Brother   . Cancer Brother   . Aneurysm Brother     Social History Social History   Tobacco Use  . Smoking status:  Former Smoker    Packs/day: 2.00    Years: 45.00    Pack years: 90.00    Types: Cigarettes    Last attempt to quit: 10/17/2015    Years since quitting: 3.5  . Smokeless tobacco: Never Used  Substance Use Topics  . Alcohol use: Yes    Alcohol/week: 0.0 standard drinks    Comment: glass of wine at night  . Drug use: No     Allergies   Amlodipine   Review of Systems Review of Systems  All other systems reviewed and are negative.    Physical Exam Updated Vital Signs BP 114/69   Pulse (!) 103   Temp 98.3 F (36.8 C) (Oral)   Resp (!) 21   SpO2 100%   Physical Exam Vitals signs and nursing note reviewed.  Constitutional:      Appearance: He is well-developed.  HENT:     Head: Normocephalic and atraumatic.  Neck:     Musculoskeletal: Normal range of motion.  Cardiovascular:     Rate and Rhythm: Normal rate and regular rhythm.     Heart sounds: Normal heart sounds.  Pulmonary:     Effort: Pulmonary effort is normal. No respiratory distress.     Breath sounds: Normal breath sounds.  Abdominal:     General: There is no distension.     Palpations: Abdomen is soft.     Tenderness: There is no abdominal tenderness.  Musculoskeletal: Normal range of motion.  Skin:    General: Skin  is warm and dry.  Neurological:     Mental Status: He is alert and oriented to person, place, and time.  Psychiatric:        Judgment: Judgment normal.      ED Treatments / Results  Labs (all labs ordered are listed, but only abnormal results are displayed) Labs Reviewed  CBC - Abnormal; Notable for the following components:      Result Value   RBC 1.60 (*)    Hemoglobin 4.9 (*)    HCT 15.7 (*)    RDW 22.1 (*)    nRBC 0.4 (*)    All other components within normal limits  COMPREHENSIVE METABOLIC PANEL - Abnormal; Notable for the following components:   Sodium 131 (*)    Glucose, Bld 126 (*)    Calcium 8.1 (*)    Albumin 2.9 (*)    AST 63 (*)    ALT 54 (*)    All other components within normal limits  TROPONIN I - Abnormal; Notable for the following components:   Troponin I 0.40 (*)    All other components within normal limits  TYPE AND SCREEN  PREPARE RBC (CROSSMATCH)   Hemoglobin  Date Value Ref Range Status  04/23/2019 4.9 (LL) 13.0 - 17.0 g/dL Final    Comment:    This critical result has verified and been called to Charlton Haws. RN by Raelyn Ensign on 06 08 2020 at 1203, and has been read back. CRITICAL RESULT VERIFIED  03/30/2019 7.6 (L) 13.0 - 17.0 g/dL Final  03/29/2019 8.4 (L) 13.0 - 17.0 g/dL Final  03/29/2019 7.3 (L) 13.0 - 17.0 g/dL Final    EKG EKG Interpretation  Date/Time:  Monday April 23 2019 11:39:59 EDT Ventricular Rate:  82 PR Interval:    QRS Duration: 99 QT Interval:  412 QTC Calculation: 482 R Axis:   59 Text Interpretation:  Sinus rhythm Low voltage, extremity leads Borderline ST depression, diffuse leads Borderline prolonged QT interval  No significant change was found Confirmed by Jola Schmidt [phone]) on 04/23/2019 1:22:15 PM   Radiology No results found.  Procedures .Critical Care Performed by: Jola Schmidt, MD Authorized by: Jola Schmidt, MD   Critical care provider statement:    Critical care time (minutes):  33   Critical care  was time spent personally by me on the following activities:  Discussions with consultants, evaluation of patient's response to treatment, examination of patient, ordering and performing treatments and interventions, ordering and review of laboratory studies, ordering and review of radiographic studies, pulse oximetry, re-evaluation of patient's condition, obtaining history from patient or surrogate and review of old charts   (including critical care time)  Medications Ordered in ED Medications  0.9 %  sodium chloride infusion (Manually program via Guardrails IV Fluids) ( Intravenous New Bag/Given 04/23/19 1223)     Initial Impression / Assessment and Plan / ED Course  I have reviewed the triage vital signs and the nursing notes.  Pertinent labs & imaging results that were available during my care of the patient were reviewed by me and considered in my medical decision making (see chart for details).       Hemoglobin 4.9.  Patient be transfused 3 units of packed red blood cells.  Advanced cancer.  Understands the limitations of therapy to this point.  He is agreeable to transfusion and discharge tomorrow if improved.  He has not had any additional work-up at this time.  This is a known complication of his metastatic spindle cell carcinoma.  Final Clinical Impressions(s) / ED Diagnoses   Final diagnoses:  Acute anemia  Spindle cell carcinoma [facility])    ED Discharge Orders    None       Jola Schmidt, MD 04/23/19 1323

## 2019-04-23 NOTE — ED Notes (Signed)
Report given to Lisa, RN.

## 2019-04-23 NOTE — H&P (Signed)
History and Physical        Hospital Admission Note Date: 04/23/2019  Patient name: [name] Medical record number: [id number] Date of birth: Dec 16, 1945 Age: 73 y.o. Gender: male  PCP: [name], MD    Patient coming from: Home  I have reviewed all records in the [facility].    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  Patient had called his PCP and was recommended to go to ED.  Per patient had a 1 unit blood transfusion 2 weeks ago. Given his advanced spindle cell carcinoma, he is under palliative care services.  Patient was found to have hemoglobin of 4.9.  Patient requested blood transfusion only and does not want any GI or surgical intervention.  ED work-up/course:  Temp 98.4, respiratory rate 21, BP 107/60, O2 sats 100% on 6 L Sodium 131, potassium 4.0, BUN 15, creatinine 0.9, troponin 0.4 Hemoglobin 4.9, hemoglobin was 7.6 on 03/30/2019  Review of Systems: Positives marked in 'bold' Constitutional: Denies fever, chills, diaphoresis, poor appetite and fatigue.  HEENT: Denies photophobia, eye pain, redness, hearing loss, ear pain, congestion, sore throat, rhinorrhea, sneezing, mouth sores, trouble swallowing, neck pain, neck stiffness and tinnitus.   Respiratory: Please see HPI Cardiovascular: Feels chest tightness today with shortness of breath.  Gastrointestinal: Denies nausea, vomiting, abdominal pain, diarrhea, constipation, blood in stool and abdominal distention.  Genitourinary: Denies dysuria, urgency, frequency, hematuria, flank pain and difficulty urinating.  Musculoskeletal: Denies myalgias, back pain, joint swelling, arthralgias and gait problem.  Skin: + pallor, no rash or wound Neurological: Denies dizziness, seizures, syncope, weakness, light-headedness, numbness and headaches.  Hematological: + Profound anemia Psychiatric/Behavioral: Denies suicidal ideation, mood changes, confusion, nervousness, sleep disturbance and agitation  Past Medical History: Past Medical History:  Diagnosis Date  . Anxiety   . Bladder cancer ([place]) 2011  . Borderline diabetes mellitus   . CAD (coronary artery disease)   . Cigarette smoker   . COPD (chronic obstructive pulmonary disease) ([place])   . DJD (degenerative joint disease)   . Emphysema of lung ([place])   . Epistaxis   . History of colonic polyps 2004   hyperplastic   . History of sinusitis   . Hypercholesteremia   . Hypertension   . Obesity     Past Surgical History:  Procedure Laterality Date  . cataract surgery  septemeber 2013  . cataract surgery/sub posterior vitrectomy in left eye  1996   Dr. Zadie Rhine  . PTCA  [phone]    Medications: Prior to Admission medications   Medication Sig Start Date End Date Taking? Authorizing Provider  Albuterol Sulfate (PROAIR RESPICLICK) 798 (90 Base) MCG/ACT AEPB Inhale 2 puffs into the lungs every 6 (six) hours as needed (shortness  of breath). 10/26/18   Noralee Space, MD  ALPRAZolam Duanne Moron) 0.5 MG tablet Take 1 tablet (0.5 mg total) by mouth at bedtime. 10/26/18   Noralee Space, MD  Ascorbic Acid (VITAMIN C) 1000 MG tablet Take 1,000 mg by mouth daily.    [provider]  budesonide (PULMICORT) 0.25 MG/2ML nebulizer solution Take 2 mLs (0.25 mg total) by nebulization 2 (two) times daily. 02/12/19   Garner Nash, DO   cholecalciferol (VITAMIN D) 400 units TABS tablet Take 400 Units by mouth at bedtime.    [provider]  Flaxseed, Linseed, (FLAX SEEDS PO) Take 1 capsule by mouth daily.    [provider]  fluticasone (FLONASE) 50 MCG/ACT nasal spray Place 2 sprays into both nostrils daily as needed for allergies or rhinitis.  09/21/18   [provider]  furosemide (LASIX) 40 MG tablet Take 40 mg by mouth daily as needed for fluid.     [provider]  ipratropium-albuterol (DUONEB) 0.5-2.5 (3) MG/3ML SOLN Take 3 mLs by nebulization every 4 (four) hours as needed. Patient taking differently: Take 3 mLs by nebulization 4 (four) times daily.  02/12/19   Icard, Leory Plowman L, DO  lisinopril (ZESTRIL) 10 MG tablet Take 10 mg by mouth daily.    [provider]  methocarbamol (ROBAXIN) 500 MG tablet Take 1,000 mg by mouth at bedtime.  03/07/18   [provider]  multivitamin-lutein (OCUVITE-LUTEIN) CAPS capsule Take 1 capsule by mouth 2 (two) times a day.     [provider]  nitroGLYCERIN (NITROSTAT) 0.4 MG SL tablet Place 1 tablet (0.4 mg total) under the tongue every 5 (five) minutes as needed for chest pain. 11/19/15   Richardson Dopp T, PA-C  pantoprazole (PROTONIX) 40 MG tablet Take 40 mg by mouth daily. 03/03/19   [provider]  Polyethyl Glycol-Propyl Glycol (SYSTANE OP) Apply 1 drop to eye daily as needed (dry eyes).    [provider]  simvastatin (ZOCOR) 20 MG tablet Take 1 tablet (20 mg total) by mouth daily at 6 PM. 11/19/15   Kathlen Mody, Nicki Reaper T, PA-C  sodium chloride (OCEAN) 0.65 % SOLN nasal spray Place 1 spray into both nostrils as needed for congestion. Patient taking differently: Place 1 spray into both nostrils daily as needed for congestion.  10/29/15   Cherene Altes, MD  traMADol (ULTRAM) 50 MG tablet Take 1 tablet (50 mg total) by mouth every 6 (six) hours as needed for moderate pain. 03/30/19   Eugenie Filler, MD  vitamin  B-12 (CYANOCOBALAMIN) 1000 MCG tablet Take 1,000 mcg by mouth daily.    [provider]  VOTRIENT 200 MG tablet  02/05/19   [provider]    Allergies:   Allergies  Allergen Reactions  . Amlodipine Swelling    Social History:  reports that he quit smoking about 3 years ago. His smoking use included cigarettes. He has a 90.00 pack-year smoking history. He has never used smokeless tobacco. He reports current alcohol use. He reports that he does not use drugs.  Family History: Family History  Problem Relation Age of Onset  . Heart disease Mother   . Heart disease Brother   . Cancer Brother   . Aneurysm Brother     Physical Exam: Blood pressure (!) 106/58, pulse (!) 105, temperature 98.3 F (36.8 C), temperature source Oral, resp. rate (!) 22, SpO2 100 %. General: Alert, awake, oriented x3, in no acute distress.  Visibly short of breath.  Eyes: Pale conjunctiva,anicteric sclera, pupils equal and reactive to light and accomodation, HEENT: normocephalic, atraumatic, oropharynx clear Neck: supple, no masses or lymphadenopathy, no goiter, no bruits, no JVD CVS: Regular rate and rhythm, without murmurs, rubs or gallops.  2+ lower extremity edema Resp : Decreased breath sound at the bases GI : Soft, tender in the lower abdomen, nondistended, positive bowel sounds. Musculoskeletal: No clubbing or cyanosis, positive pedal pulses. No contracture. ROM intact  Neuro: Grossly intact, no focal neurological deficits, strength 5/5 upper and lower extremities bilaterally Psych: alert and oriented x 3, normal mood and affect Skin: no rashes or lesions, warm and dry   LABS on Admission: I have personally reviewed all the labs and imagings below    Basic Metabolic Panel: Recent Labs  Lab 04/23/19 1135  NA 131*  K 4.0  CL 98  CO2 23  GLUCOSE 126*  BUN 15  CREATININE 0.94  CALCIUM 8.1*   Liver Function Tests: Recent Labs  Lab 04/23/19 1135  AST 63*  ALT 54*   ALKPHOS 112  BILITOT 1.1  PROT 6.5  ALBUMIN 2.9*   No results for input(s): LIPASE, AMYLASE in the last 168 hours. No results for input(s): AMMONIA in the last 168 hours. CBC: Recent Labs  Lab 04/23/19 1135  WBC 7.2  HGB 4.9*  HCT 15.7*  MCV 98.1  PLT 195   Cardiac Enzymes: Recent Labs  Lab 04/23/19 1135  TROPONINI 0.40*   BNP: Invalid input(s): POCBNP CBG: No results for input(s): GLUCAP in the last 168 hours.  Radiological Exams on Admission:  No results found.    EKG: Independently reviewed.  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 -ED physician and myself discussed in detail with the patient regarding GI bleed and profound anemia.  Patient at this time has requested only transfusion, states that he knows poor prognosis, no additional work-up and DNR status. - will transfuse 3 units packed RBCs, with Lasix in between 20 mg IV  Active Problems:   Essential hypertension -BP soft, continue packed RBC transfusion    End stage COPD (H[facility]) -Continue duo nebs, O2, currently no wheezing    Chronic diastolic CHF (congestive heart failure) ([place]) -Will give Lasix 20 mg IV in between transfusion, restart Lasix oral 40 mg daily tomorrow Overall poor prognosis  DVT prophylaxis: SCDs  CODE STATUS: DNR/DNI  Consults called: None  Family Communication: Admission, patients condition and plan of care including tests being ordered have been discussed with the patient who indicates understanding and agree with the plan and Code Status  Admission status:   Disposition plan: Further plan will depend as patient's clinical course evolves and further radiologic and laboratory data become available.    At the time of admission, it appears that the appropriate  admission status for this patient is observation. This is judged to be reasonable and necessary in order to provide the required intensity of service to ensure the patient's safety given the presenting symptoms profound anemia with dark stool, physical exam findings, and initial radiographic and laboratory data in the context of their chronic comorbidities.  The medical decision making on this patient was of high complexity and the patient is at high risk for clinical deterioration, therefore this is a level 3 visit.     Time Spent on Admission:  31 minutes    Ripudeep Rai M.D. [facility] 04/23/2019, 1:42 PM

## 2019-04-23 NOTE — Progress Notes (Signed)
Called [facility] and attempted to receive report. On hold for several minutes and no one returned to telephone.

## 2019-04-23 NOTE — ED Triage Notes (Signed)
Pt arrived via EMS from home. Pt had a GI bleed 2 weeks ago and was given a transfusion. Pt reports that his PCP stated that his HGB was low about a week. Pt reports that Sat, he began feeling weak, pt reports that he had a BM today and was dark in color. Pt is on continuous O2@ 6 lpm. Pt is hyptn on arrival. Pt was taken Chemo 3 weeks ago, and has not been compliant.   BP 98/60 cbg 256  O2 sat 100% , HR 80's

## 2019-04-24 DIAGNOSIS — D62 Acute posthemorrhagic anemia: Secondary | ICD-10-CM | POA: Diagnosis not present

## 2019-04-24 LAB — BASIC METABOLIC PANEL
Anion gap: 7 (ref 5–15)
BUN: 13 mg/dL (ref 8–23)
CO2: 26 mmol/L (ref 22–32)
Calcium: 7.9 mg/dL — ABNORMAL LOW (ref 8.9–10.3)
Chloride: 100 mmol/L (ref 98–111)
Creatinine, Ser: 0.77 mg/dL (ref 0.61–1.24)
GFR calc Af Amer: >60 mL/min (ref >60)
GFR calc non Af Amer: >60 mL/min (ref >60)
Glucose, Bld: 108 mg/dL — ABNORMAL HIGH (ref 70–99)
Potassium: 3.5 mmol/L (ref 3.5–5.1)
Sodium: 133 mmol/L — ABNORMAL LOW (ref 135–145)

## 2019-04-24 LAB — CBC
HCT: 21.5 % — ABNORMAL LOW (ref 39.0–52.0)
Hemoglobin: 7.1 g/dL — ABNORMAL LOW (ref 13.0–17.0)
MCH: 30.9 pg (ref 26.0–34.0)
MCHC: 33 g/dL (ref 30.0–36.0)
MCV: 93.5 fL (ref 80.0–100.0)
Platelets: 164 10*3/uL (ref 150–400)
RBC: 2.3 MIL/uL — ABNORMAL LOW (ref 4.22–5.81)
RDW: 18.7 % — ABNORMAL HIGH (ref 11.5–15.5)
WBC: 4.8 10*3/uL (ref 4.0–10.5)
nRBC: 0.8 % — ABNORMAL HIGH (ref 0.0–0.2)

## 2019-04-24 LAB — PREPARE RBC (CROSSMATCH)

## 2019-04-24 MED ORDER — FUROSEMIDE 10 MG/ML IJ SOLN
20.0000 mg | Freq: Once | INTRAMUSCULAR | Status: AC
  Administered 2019-04-24: 20 mg via INTRAVENOUS
  Filled 2019-04-24: qty 2

## 2019-04-24 MED ORDER — SODIUM CHLORIDE 0.9% IV SOLUTION
Freq: Once | INTRAVENOUS | Status: AC
  Administered 2019-04-24: 15:00:00 via INTRAVENOUS

## 2019-04-24 NOTE — Plan of Care (Signed)
Patient in for blood transfusion and will discharge home after current unit. Will d/c home with wife.

## 2019-04-24 NOTE — Discharge Summary (Signed)
Physician Discharge Summary  [name] QQV:[id number] DOB: [birth year] DOA: 04/23/2019  PCP: [name], MD  Admit date: 04/23/2019 Discharge date: 04/24/2019  Time spent: 20 minutes  Recommendations for Outpatient Follow-up:  1. Suggest follow-up with PCP/oncologist for outpatient goals of care 2. Labs as per protocol  Discharge Diagnoses:  Principal Problem:   Acute blood loss anemia Active Problems:   Malignant neoplasm of bladder (HCC)   Essential hypertension   End stage COPD (HCC)   Spindle cell carcinoma (HCC)   Chronic diastolic CHF (congestive heart failure) (HCC)   GI bleed   Abdominal pain   SOB (shortness of breath)   Discharge Condition: Guarded  Diet recommendation: Heart healthy  Filed Weights   04/23/19 1741  Weight: 95.5 kg    History of present illness:  73 year old male end-stage COPD chronic diastolic heart failure with chronic respiratory failure at baseline 6 L with activity on BiPAP with 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 followed by [facility] CAD-  Presented to ED 6/8 BMs have been dark recent blood transfusion 1 week prior Hemoglobin down to 4  Patient received 4 total units of blood transfusions on this hospital stay I had a long discussion with him and asked him what are his goals-he states he has a wife and a family and grandchildren that he does not want to let go of-I have cautioned him that there may be no real curative treatment insight but definitely there are hospice and palliative options-he will think about this-he tells me does not want to come back to the ED-I have explained to him that this certainly can be possible by hospice needs to be coordinated as an outpatient with his family doctor and his oncologist-he is aware and will follow-up   Discharge Exam: Vitals:   04/24/19 0938 04/24/19 1207  BP: (!) 92/51   Pulse:    Resp:    Temp:    SpO2:  98%    General: Awake alert thick neck which is  soft and supple Mallampati 3 no thyromegaly no lymphadenopathy Cardiovascular: Chest clinically clear S1-S2 no murmur  Abdomen-Is soft nontender no rebound no guarding  Discharge Instructions   Discharge Instructions    Diet - low sodium heart healthy   Complete by:  As directed    Discharge instructions   Complete by:  As directed    Please follow-up with your regular scheduled physician and consider lab work I would also consider strongly a discussion with your regular oncologist regarding goals of care and other issues pertaining to end-of-life as it appears you have options which are mainly of a palliative nature   Increase activity slowly   Complete by:  As directed      Allergies as of 04/24/2019      Reactions   Amlodipine Swelling      Medication List    STOP taking these medications   lisinopril 10 MG tablet Commonly known as:  ZESTRIL     TAKE these medications   Albuterol Sulfate 108 (90 Base) MCG/ACT Aepb Commonly known as:  ProAir RespiClick Inhale 2 puffs into the lungs every 6 (six) hours as needed (shortness of breath).   ALPRAZolam 0.5 MG tablet Commonly known as:  XANAX Take 1 tablet (0.5 mg total) by mouth at bedtime.   budesonide 0.25 MG/2ML nebulizer solution Commonly known as:  PULMICORT Take 2 mLs (0.25 mg total) by nebulization 2 (two) times daily.   cholecalciferol 10 MCG (400 UNIT) Tabs tablet  Commonly known as:  VITAMIN D3 Take 400 Units by mouth at bedtime.   FLAX SEEDS PO Take 1 capsule by mouth daily.   fluticasone 50 MCG/ACT nasal spray Commonly known as:  FLONASE Place 2 sprays into both nostrils daily as needed for allergies or rhinitis.   furosemide 40 MG tablet Commonly known as:  LASIX Take 40 mg by mouth daily as needed for fluid.   ipratropium-albuterol 0.5-2.5 (3) MG/3ML Soln Commonly known as:  DUONEB Take 3 mLs by nebulization every 4 (four) hours as needed. What changed:    when to take this  additional  instructions   methocarbamol 500 MG tablet Commonly known as:  ROBAXIN Take 1,000 mg by mouth at bedtime.   multivitamin-lutein Caps capsule Take 1 capsule by mouth 2 (two) times a day.   nitroGLYCERIN 0.4 MG SL tablet Commonly known as:  NITROSTAT Place 1 tablet (0.4 mg total) under the tongue every 5 (five) minutes as needed for chest pain.   ondansetron 8 MG disintegrating tablet Commonly known as:  ZOFRAN-ODT Take 8 mg by mouth every 8 (eight) hours as needed for nausea/vomiting.   pantoprazole 40 MG tablet Commonly known as:  PROTONIX Take 40 mg by mouth daily.   simvastatin 20 MG tablet Commonly known as:  ZOCOR Take 1 tablet (20 mg total) by mouth daily at 6 PM.   sodium chloride 0.65 % Soln nasal spray Commonly known as:  OCEAN Place 1 spray into both nostrils as needed for congestion. What changed:  when to take this   SYSTANE OP Apply 1 drop to eye every 6 (six) hours as needed (dry eyes).   traMADol 50 MG tablet Commonly known as:  ULTRAM Take 1 tablet (50 mg total) by mouth every 6 (six) hours as needed for moderate pain.   vitamin B-12 1000 MCG tablet Commonly known as:  CYANOCOBALAMIN Take 1,000 mcg by mouth daily.   vitamin C 1000 MG tablet Take 1,000 mg by mouth daily.   Votrient 200 MG tablet Generic drug:  pazopanib      Allergies  Allergen Reactions  . Amlodipine Swelling   Follow-up Information    [name], MD.   Specialty:  Internal Medicine Contact information: [address] [place] [address] [phone]            The results of significant diagnostics from this hospitalization (including imaging, microbiology, ancillary and laboratory) are listed below for reference.    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 Enlarging pulmonary nodules on recent chest radiograph. Vomiting and diarrhea.  Weakness. Home oxygen for COPD. Tachycardia. EXAM: CT CHEST, ABDOMEN, AND PELVIS WITH CONTRAST TECHNIQUE: Multidetector CT imaging of the chest, abdomen and pelvis was performed following the standard protocol during bolus administration of intravenous contrast. CONTRAST:  163mL OMNIPAQUE IOHEXOL 300 MG/ML  SOLN COMPARISON:  Multiple exams, including 12/14/2018, 02/17/2017, and chest radiograph from 03/27/2019 FINDINGS: CT CHEST FINDINGS Cardiovascular: Coronary, aortic arch, and branch vessel atherosclerotic vascular disease. Mediastinum/Nodes: Unremarkable Lungs/Pleura: Scattered pulmonary nodules in the lungs are mostly new compared to 02/17/2017 although several are stable. A new right upper lobe nodule on image 45/6 measures 1.5 by 1.5 cm. A new right lower lobe nodule on image 115/6 measures 2.6 by 2.1 cm. A new left lower lobe nodule on image 70/6 measures 1.8 by 1.5 cm. Multiple additional new nodules are present. Markedly severe emphysema. Musculoskeletal: Lower thoracic  spondylosis. CT ABDOMEN PELVIS FINDINGS Hepatobiliary: Prior chemoembolization of a left hepatic lobe lesion with coils noted and a distal left hepatic artery branch, and with a cystic and rim calcified treated lesion measuring 4.8 by 3.7 cm on image 56/2, formerly 5.4 by 4.1 cm on 12/14/2018. Medially in segment 2 of the liver, a 3.7 by 2.8 cm hypodense lesion on image 49/2 was previously 4.8 by 3.9 cm by my measurements, a significant improvement. 1.0 cm gallstone in the gallbladder. Ill-defined potential 2.0 by 1.2 cm lesion in segment 5 of the liver on image 65/2, not well appreciated on prior CT exams, and only vaguely seen today. In the dome of the right hepatic lobe there is a suggestion of a 1.0 cm hyperdense lesion on image 51/2 which could be a small hemangioma but which is technically nonspecific. Pancreas: Unremarkable Spleen: Unremarkable Adrenals/Urinary Tract: 6.6 cm in diameter right mid kidney fluid density cyst. Adrenal  glands normal. Urinary bladder unremarkable. Stomach/Bowel: Small-bowel obstruction with dilated and thick-walled loops of small bowel up to 4.4 cm in diameter. Mild associated mesenteric edema along the involved loops. The transition point is along the anterior margin of the large right abdominal tumor which is at least effacing and extrinsically compressing the involved small bowel loop, and invasion of the loop is not excluded. Sigmoid colon diverticulosis. Vascular/Lymphatic: Aortoiliac atherosclerotic vascular disease. Reproductive: Unremarkable Other: Eccentric to the right in the abdomen, the large abdominal mass currently measures 24.0 by 16.0 by 18.5 cm (volume = 3720 cm^3). There is adjacent ascites in the right lower quadrant, nonspecific for malignant versus bland ascites. Musculoskeletal: Spondylosis and degenerative disc disease at L5-S1 causing bilateral foraminal impingement. IMPRESSION: 1. Small-bowel obstruction due to the large right-sided tumor. Dilated thick-walled loops of small bowel extend to a loop which runs anterior to the tumor. This loop is severely effaced and likely compressed by the tumor, and invasion is not excluded. 2. Large right-sided intra-abdominal tumor currently measures at 3720 cubic cm, previously 3110 cubic cm on 12/14/2018. 3. Compared to the most recent chest CT from 02/17/2017, there are various scattered new pulmonary nodules measuring up to about 2.6 cm in diameter, compatible with metastatic disease. Several of the pulmonary nodules are chronic but most are new. 4. Further reduction in size of the lateral segment left hepatic lobe lesions, status post prior embolization. However, there is a questionable new 2.0 cm ill-defined lesion in segment 5 of the liver which could be a new metastatic lesion. 5. Markedly severe emphysema.  Emphysema (ICD10-J43.9). 6.  Aortic Atherosclerosis (ICD10-I70.0).  Coronary atherosclerosis. 7. Lumbar impingement L5-S1. 8. Sigmoid colon  diverticulosis. Electronically Signed   By: Van Clines M.D.   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 Enlarging pulmonary nodules on recent chest radiograph. Vomiting and diarrhea. Weakness. Home oxygen for COPD. Tachycardia. EXAM: CT CHEST, ABDOMEN, AND PELVIS WITH CONTRAST TECHNIQUE: Multidetector CT imaging of the chest, abdomen and pelvis was performed following the standard protocol during bolus administration of intravenous contrast. CONTRAST:  136mL OMNIPAQUE IOHEXOL 300 MG/ML  SOLN COMPARISON:  Multiple exams, including 12/14/2018, 02/17/2017, and chest radiograph from 03/27/2019 FINDINGS: CT CHEST FINDINGS Cardiovascular: Coronary, aortic arch, and branch vessel atherosclerotic vascular disease. Mediastinum/Nodes: Unremarkable Lungs/Pleura: Scattered pulmonary nodules in the lungs are mostly new compared to 02/17/2017 although several are stable. A new right upper lobe nodule on image 45/6 measures 1.5 by 1.5  cm. A new right lower lobe nodule on image 115/6 measures 2.6 by 2.1 cm. A new left lower lobe nodule on image 70/6 measures 1.8 by 1.5 cm. Multiple additional new nodules are present. Markedly severe emphysema. Musculoskeletal: Lower thoracic spondylosis. CT ABDOMEN PELVIS FINDINGS Hepatobiliary: Prior chemoembolization of a left hepatic lobe lesion with coils noted and a distal left hepatic artery branch, and with a cystic and rim calcified treated lesion measuring 4.8 by 3.7 cm on image 56/2, formerly 5.4 by 4.1 cm on 12/14/2018. Medially in segment 2 of the liver, a 3.7 by 2.8 cm hypodense lesion on image 49/2 was previously 4.8 by 3.9 cm by my measurements, a significant improvement. 1.0 cm gallstone in the gallbladder. Ill-defined potential 2.0 by 1.2 cm lesion in segment 5 of the liver on image 65/2, not well appreciated on prior CT exams, and only vaguely seen  today. In the dome of the right hepatic lobe there is a suggestion of a 1.0 cm hyperdense lesion on image 51/2 which could be a small hemangioma but which is technically nonspecific. Pancreas: Unremarkable Spleen: Unremarkable Adrenals/Urinary Tract: 6.6 cm in diameter right mid kidney fluid density cyst. Adrenal glands normal. Urinary bladder unremarkable. Stomach/Bowel: Small-bowel obstruction with dilated and thick-walled loops of small bowel up to 4.4 cm in diameter. Mild associated mesenteric edema along the involved loops. The transition point is along the anterior margin of the large right abdominal tumor which is at least effacing and extrinsically compressing the involved small bowel loop, and invasion of the loop is not excluded. Sigmoid colon diverticulosis. Vascular/Lymphatic: Aortoiliac atherosclerotic vascular disease. Reproductive: Unremarkable Other: Eccentric to the right in the abdomen, the large abdominal mass currently measures 24.0 by 16.0 by 18.5 cm (volume = 3720 cm^3). There is adjacent ascites in the right lower quadrant, nonspecific for malignant versus bland ascites. Musculoskeletal: Spondylosis and degenerative disc disease at L5-S1 causing bilateral foraminal impingement. IMPRESSION: 1. Small-bowel obstruction due to the large right-sided tumor. Dilated thick-walled loops of small bowel extend to a loop which runs anterior to the tumor. This loop is severely effaced and likely compressed by the tumor, and invasion is not excluded. 2. Large right-sided intra-abdominal tumor currently measures at 3720 cubic cm, previously 3110 cubic cm on 12/14/2018. 3. Compared to the most recent chest CT from 02/17/2017, there are various scattered new pulmonary nodules measuring up to about 2.6 cm in diameter, compatible with metastatic disease. Several of the pulmonary nodules are chronic but most are new. 4. Further reduction in size of the lateral segment left hepatic lobe lesions, status post prior  embolization. However, there is a questionable new 2.0 cm ill-defined lesion in segment 5 of the liver which could be a new metastatic lesion. 5. Markedly severe emphysema.  Emphysema (ICD10-J43.9). 6.  Aortic Atherosclerosis (ICD10-I70.0).  Coronary atherosclerosis. 7. Lumbar impingement L5-S1. 8. Sigmoid colon diverticulosis. Electronically Signed   By: Van Clines M.D.   On: 03/27/2019 16:18   Dg Chest Portable 1 View  Result Date: 03/27/2019 CLINICAL DATA:  Weakness with nausea and vomiting EXAM: PORTABLE CHEST 1 VIEW COMPARISON:  02/19/2019, CT from 02/17/2017 FINDINGS: Cardiac shadow is stable. Aortic calcifications are noted. The lungs are well aerated bilaterally and demonstrate multiple pulmonary nodules increased from the prior exam. Largest of these lies in the right upper lobe measuring 2.1 cm in greatest dimension. No acute bony abnormality is noted. IMPRESSION: Enlarging pulmonary nodules bilaterally. CT of the chest is recommended for further evaluation. Electronically Signed   By:  Inez Catalina M.D.   On: 03/27/2019 12:48    Microbiology: Recent Results (from the past 240 hour(s))  SARS Coronavirus 2 (CEPHEID - Performed in [place] hospital lab), Hosp Order     Status: None   Collection Time: 04/23/19  3:00 PM  Result Value Ref Range Status   SARS Coronavirus 2 NEGATIVE NEGATIVE Final    Comment: (NOTE) If result is NEGATIVE SARS-CoV-2 target nucleic acids are NOT DETECTED. The SARS-CoV-2 RNA is generally detectable in upper and lower  respiratory specimens during the acute phase of infection. The lowest  concentration of SARS-CoV-2 viral copies this assay can detect is 250  copies / mL. A negative result does not preclude SARS-CoV-2 infection  and should not be used as the sole basis for treatment or other  patient management decisions.  A negative result may occur with  improper specimen collection / handling, submission of specimen other  than nasopharyngeal swab,  presence of viral mutation(s) within the  areas targeted by this assay, and inadequate number of viral copies  (<250 copies / mL). A negative result must be combined with clinical  observations, patient history, and epidemiological information. If result is POSITIVE SARS-CoV-2 target nucleic acids are DETECTED. The SARS-CoV-2 RNA is generally detectable in upper and lower  respiratory specimens dur ing the acute phase of infection.  Positive  results are indicative of active infection with SARS-CoV-2.  Clinical  correlation with patient history and other diagnostic information is  necessary to determine patient infection status.  Positive results do  not rule out bacterial infection or co-infection with other viruses. If result is PRESUMPTIVE POSTIVE SARS-CoV-2 nucleic acids MAY BE PRESENT.   A presumptive positive result was obtained on the submitted specimen  and confirmed on repeat testing.  While 2019 novel coronavirus  (SARS-CoV-2) nucleic acids may be present in the submitted sample  additional confirmatory testing may be necessary for epidemiological  and / or clinical management purposes  to differentiate between  SARS-CoV-2 and other Sarbecovirus currently known to infect humans.  If clinically indicated additional testing with an alternate test  methodology [id number]) is advised. The SARS-CoV-2 RNA is generally  detectable in upper and lower respiratory sp ecimens during the acute  phase of infection. The expected result is Negative. Fact Sheet for Patients:  StrictlyIdeas.no Fact Sheet for Healthcare Providers: BankingDealers.co.za This test is not yet approved or cleared by the [place] FDA and has been authorized for detection and/or diagnosis of SARS-CoV-2 by FDA under an Emergency Use Authorization (EUA).  This EUA will remain in effect (meaning this test can be used) for the duration of the COVID-19 declaration under  Section 564(b)(1) of the Act, 21 U.S.C. section 360bbb-3(b)(1), unless the authorization is terminated or revoked sooner. Performed at [facility], [place] [address] [place], [place] [address]      Labs: Basic Metabolic Panel: Recent Labs  Lab 04/23/19 1135 04/24/19 0526  NA 131* 133*  K 4.0 3.5  CL 98 100  CO2 23 26  GLUCOSE 126* 108*  BUN 15 13  CREATININE 0.94 0.77  CALCIUM 8.1* 7.9*   Liver Function Tests: Recent Labs  Lab 04/23/19 1135  AST 63*  ALT 54*  ALKPHOS 112  BILITOT 1.1  PROT 6.5  ALBUMIN 2.9*   No results for input(s): LIPASE, AMYLASE in the last 168 hours. No results for input(s): AMMONIA in the last 168 hours. CBC: Recent Labs  Lab 04/23/19 1135 04/24/19 0526  WBC 7.2  4.8  HGB 4.9* 7.1*  HCT 15.7* 21.5*  MCV 98.1 93.5  PLT 195 164   Cardiac Enzymes: Recent Labs  Lab 04/23/19 1135  TROPONINI 0.40*   BNP: BNP (last 3 results) Recent Labs    02/18/19 1215  BNP 215.3*    ProBNP (last 3 results) No results for input(s): PROBNP in the last 8760 hours.  CBG: No results for input(s): GLUCAP in the last 168 hours.     Signed:  Nita Sells MD   [facility] 04/24/2019, 12:19 PM

## 2019-04-24 NOTE — Progress Notes (Signed)
Blood transfusion completed at 1730. Discharge teaching done and patient discharged via w/c with oxygen. No s/s of reaction.

## 2019-04-25 ENCOUNTER — Other Ambulatory Visit: Payer: Medicare Other | Admitting: Licensed Clinical Social Worker

## 2019-04-25 ENCOUNTER — Other Ambulatory Visit: Payer: Self-pay

## 2019-04-25 DIAGNOSIS — Z515 Encounter for palliative care: Secondary | ICD-10-CM

## 2019-04-25 LAB — TYPE AND SCREEN
ABO/RH(D): A POS
Antibody Screen: NEGATIVE
Unit division: 0
Unit division: 0
Unit division: 0
Unit division: 0

## 2019-04-25 LAB — BPAM RBC
Blood Product Expiration Date: 202006182359
Blood Product Expiration Date: 202006202359
Blood Product Expiration Date: 202006232359
Blood Product Expiration Date: 202006242359
ISSUE DATE / TIME: 202006081238
ISSUE DATE / TIME: 202006081612
ISSUE DATE / TIME: 202006082042
ISSUE DATE / TIME: 202006091448
Unit Type and Rh: 6200
Unit Type and Rh: 6200
Unit Type and Rh: 6200
Unit Type and Rh: 6200

## 2019-04-25 NOTE — Progress Notes (Signed)
COMMUNITY PALLIATIVE CARE SW NOTE  PATIENT NAME: [name] DOB: Apr 12, 1946 MRN: [id number]  PRIMARY CARE PROVIDER: [name], MD  RESPONSIBLE PARTY:  Acct ID - Guarantor Home Phone Work Phone Relationship Acct Type  [id number] Andrew Carwin910 [phone]  Self P/F     [name], [name] [id number]   Due to the COVID-19 crisis, this virtual check-in visit was done via telephone from my office and it was initiated and consent given by this patientand family.   PLAN OF CARE and INTERVENTIONS:             1. GOALS OF CARE/ ADVANCE CARE PLANNING:  Goal is for patient to remain at home with his wife, [name].  Patient is a full code. 2. SOCIAL/EMOTIONAL/SPIRITUAL ASSESSMENT/ INTERVENTIONS:  SW conducted a Sales executive visit with patient's wife, [name]. SW provided active listening and supportive counseling while she discussed patient's recent hospitalization.  The MD raised the question of Hospice to patient.  He has mixed feelings about it and would like the Palliative Care RN and SW to meet with him to discuss Hospice further.  SW to arrange a visit for tomorrow at 11am. 3. PATIENT/CAREGIVER EDUCATION/ COPING:  Patient copes by expressing his feelings openly. 4. PERSONAL EMERGENCY PLAN:  Patient will inform his wife and daughter, who is a Marine scientist, if there is a medical concern. 5. COMMUNITY RESOURCES COORDINATION/ HEALTH CARE NAVIGATION:  N 6. FINANCIAL/LEGAL CONCERNS/INTERVENTIONS:  N     SOCIAL HX:  Social History   Tobacco Use  . Smoking status: Former Smoker    Packs/day: 2.00    Years: 45.00    Pack years: 90.00    Types: Cigarettes    Last attempt to quit: 10/17/2015    Years since quitting: 3.5  . Smokeless tobacco: Never Used  Substance Use Topics  . Alcohol use: Yes    Alcohol/week: 0.0 standard drinks    Comment: glass of wine at night    CODE STATUS:  Full Code  ADVANCED DIRECTIVES: N MOST FORM COMPLETE:  N HOSPICE EDUCATION PROVIDED:  N PPS:   Patient's appetite is normal.  He independently ambulates. Duration of visit and documentation:  30 minutes.      Andrew Corn Leylanie Woodmansee, LCSW

## 2019-04-26 ENCOUNTER — Other Ambulatory Visit: Payer: Self-pay

## 2019-04-26 ENCOUNTER — Other Ambulatory Visit: Payer: Medicare Other | Admitting: Licensed Clinical Social Worker

## 2019-04-26 ENCOUNTER — Other Ambulatory Visit: Payer: Medicare Other | Admitting: *Deleted

## 2019-04-26 DIAGNOSIS — Z515 Encounter for palliative care: Secondary | ICD-10-CM

## 2019-04-26 NOTE — Progress Notes (Signed)
COMMUNITY PALLIATIVE CARE SW NOTE  PATIENT NAME: [name] DOB: 03/13/1946 MRN: [id number]  PRIMARY CARE PROVIDER: [name], MD  RESPONSIBLE PARTY:  Acct ID - Guarantor Home Phone Work Phone Relationship Acct Type  [id number] [name]  Self P/F     [place], [place], [place] [address]   Due to the COVID-19 crisis, this virtual check-in visit was done via telephone from my office and it was initiated and consent given by this patientand family.  PLAN OF CARE and INTERVENTIONS:             1. GOALS OF CARE/ ADVANCE CARE PLANNING:  Patient's goal is to remain at home with his wife, [name].  He is a full code. 2. SOCIAL/EMOTIONAL/SPIRITUAL ASSESSMENT/ INTERVENTIONS:  SW and Palliative Care RN, [name], met with patient and his wife, [name], at their home.  He stated he wished to continue receiving blood transfusions.  He does not want to utilize Hospice at this time due to his personal feelings.  SW and RN further discussed services.  He will contact RN when he is ready.  SW provided active listening and supportive counseling. 3. PATIENT/CAREGIVER EDUCATION/ COPING:  SW provided education regarding Hospice.  Patient and his wife express their feelings openly. 4. PERSONAL EMERGENCY PLAN:  Wife will contact EMS. 5. COMMUNITY RESOURCES COORDINATION/ HEALTH CARE NAVIGATION:  None. 6. FINANCIAL/LEGAL CONCERNS/INTERVENTIONS:  None.     SOCIAL HX:  Social History   Tobacco Use  . Smoking status: Former Smoker    Packs/day: 2.00    Years: 45.00    Pack years: 90.00    Types: Cigarettes    Quit date: 10/17/2015    Years since quitting: 3.5  . Smokeless tobacco: Never Used  Substance Use Topics  . Alcohol use: Yes    Alcohol/week: 0.0 standard drinks    Comment: glass of wine at night    CODE STATUS:  Full Code  ADVANCED DIRECTIVES: N MOST FORM COMPLETE:  N HOSPICE EDUCATION PROVIDED:  Y PPS:  Patient's appetite is normal.  He ambulates  independently. Duration of visit and documentation:  2 hours.      Creola Corn Anjanette Gilkey, LCSW

## 2019-04-27 ENCOUNTER — Other Ambulatory Visit: Payer: Self-pay

## 2019-05-01 DIAGNOSIS — R0602 Shortness of breath: Secondary | ICD-10-CM | POA: Diagnosis not present

## 2019-05-01 DIAGNOSIS — D62 Acute posthemorrhagic anemia: Secondary | ICD-10-CM | POA: Diagnosis not present

## 2019-05-01 DIAGNOSIS — C499 Malignant neoplasm of connective and soft tissue, unspecified: Secondary | ICD-10-CM | POA: Diagnosis not present

## 2019-05-01 DIAGNOSIS — K922 Gastrointestinal hemorrhage, unspecified: Secondary | ICD-10-CM | POA: Diagnosis not present

## 2019-05-01 DIAGNOSIS — I509 Heart failure, unspecified: Secondary | ICD-10-CM | POA: Diagnosis not present

## 2019-05-01 DIAGNOSIS — Z8551 Personal history of malignant neoplasm of bladder: Secondary | ICD-10-CM | POA: Diagnosis not present

## 2019-05-01 DIAGNOSIS — J449 Chronic obstructive pulmonary disease, unspecified: Secondary | ICD-10-CM | POA: Diagnosis not present

## 2019-05-01 DIAGNOSIS — I10 Essential (primary) hypertension: Secondary | ICD-10-CM | POA: Diagnosis not present

## 2019-05-01 DIAGNOSIS — R109 Unspecified abdominal pain: Secondary | ICD-10-CM | POA: Diagnosis not present

## 2019-05-02 NOTE — Progress Notes (Signed)
COMMUNITY PALLIATIVE CARE RN NOTE  PATIENT NAME: [name] DOB: 16-Aug-1946 MRN: [id number]  PRIMARY CARE PROVIDER: [name], MD  RESPONSIBLE PARTY:  Acct ID - Guarantor Home Phone Work Phone Relationship Acct Type  [id number] [name]  Self P/F     [address], [place], [place] [address]   Covid-19 Pre-screening Negative  PLAN OF CARE and INTERVENTION:  1. ADVANCE CARE PLANNING/GOALS OF CARE: Goal is to remain at home with his wife. He is a DNR. 2. PATIENT/CAREGIVER EDUCATION: [facility], Safe Mobility and Hospice Education 3. DISEASE STATUS: Joint visit made with Palliative Care SW, Lynn Duffy. Met with patient and his wife. Visit completed on front porch. Patient is awake and alert and able to engage in appropriate conversation. He was just recently discharged from the hospital due to a GI bleed on 04/24/19. Patient feels that bleeding was worse d/t straining to have a BM from constipation. He was also very weak and experiencing some nausea/vomiting. He felt that his Hgb was low so went to the ED. Hgb was 4.9. He received 4 units of PRBCs and Hgb came up to 7.1. He recently received blood one week ago. He denies pain at this time, but abdomen is sore at times R abdomen. He has Tramadol on hand if needed and is effective. He remains ambulatory, but his wife does help with bathing and putting his pants on as patient is unable to bend over due to abdominal distention. He remains on Oxygen at 6L/min via [place] and on bi-pap at night.  No dyspnea noted during visit while patient is at rest, but it does occur with exertion. He continues on nebulizers routinely which helps. He feels that his intake is ok at this time. He denies nausea at this time. He says that it has been recommended that he transition to hospice services. Provided hospice education, services provided and what it entails. Patient feels that at some point he will need hospice, but does not feel  that he is ready at this time. Will continue to monitor.   HISTORY OF PRESENT ILLNESS:  This is a 73 yo male who resides at home with his wife. Palliative Care Team continues to follow patient. Next visit in 2 weeks.   CODE STATUS: DNR  ADVANCED DIRECTIVES: N MOST FORM: no PPS: 40%   PHYSICAL EXAM:   VITALS: Today's Vitals   04/26/19 2351  BP: (!) 93/57  Pulse: 86  Resp: 20  Temp: (!) 97 F (36.1 C)  TempSrc: Temporal  SpO2: 98%  PainSc: 0-No pain    LUNGS: decreased breath sounds CARDIAC: Cor RRR EXTREMITIES: 1+ edema bilateral lower extremities SKIN: Skin pale, but dry and intact; bruising noted to R wrist/posterior hand  NEURO: Alert and oriented x 4, generalized weakness, ambulatory   (Duration of visit and documentation 90 minutes    [name], RN BSN

## 2019-05-06 ENCOUNTER — Encounter (HOSPITAL_COMMUNITY): Payer: Self-pay | Admitting: Emergency Medicine

## 2019-05-06 ENCOUNTER — Emergency Department (HOSPITAL_COMMUNITY): Payer: Medicare Other

## 2019-05-06 ENCOUNTER — Other Ambulatory Visit: Payer: Self-pay

## 2019-05-06 ENCOUNTER — Inpatient Hospital Stay (HOSPITAL_COMMUNITY)
Admission: EM | Admit: 2019-05-06 | Discharge: 2019-05-07 | DRG: 812 | Disposition: A | Discharge status: Home / Self Care | Payer: Medicare Other | Attending: Family Medicine | Admitting: Family Medicine

## 2019-05-06 DIAGNOSIS — C679 Malignant neoplasm of bladder, unspecified: Secondary | ICD-10-CM | POA: Diagnosis present

## 2019-05-06 DIAGNOSIS — E119 Type 2 diabetes mellitus without complications: Secondary | ICD-10-CM | POA: Diagnosis present

## 2019-05-06 DIAGNOSIS — R001 Bradycardia, unspecified: Secondary | ICD-10-CM | POA: Diagnosis not present

## 2019-05-06 DIAGNOSIS — R000 Tachycardia, unspecified: Secondary | ICD-10-CM | POA: Diagnosis not present

## 2019-05-06 DIAGNOSIS — I2510 Atherosclerotic heart disease of native coronary artery without angina pectoris: Secondary | ICD-10-CM | POA: Diagnosis present

## 2019-05-06 DIAGNOSIS — Z7951 Long term (current) use of inhaled steroids: Secondary | ICD-10-CM

## 2019-05-06 DIAGNOSIS — Z87891 Personal history of nicotine dependence: Secondary | ICD-10-CM | POA: Diagnosis not present

## 2019-05-06 DIAGNOSIS — Z20828 Contact with and (suspected) exposure to other viral communicable diseases: Secondary | ICD-10-CM | POA: Diagnosis not present

## 2019-05-06 DIAGNOSIS — Z1159 Encounter for screening for other viral diseases: Secondary | ICD-10-CM

## 2019-05-06 DIAGNOSIS — R0602 Shortness of breath: Secondary | ICD-10-CM | POA: Diagnosis not present

## 2019-05-06 DIAGNOSIS — C787 Secondary malignant neoplasm of liver and intrahepatic bile duct: Secondary | ICD-10-CM | POA: Diagnosis present

## 2019-05-06 DIAGNOSIS — D649 Anemia, unspecified: Secondary | ICD-10-CM | POA: Diagnosis not present

## 2019-05-06 DIAGNOSIS — Z8249 Family history of ischemic heart disease and other diseases of the circulatory system: Secondary | ICD-10-CM | POA: Diagnosis not present

## 2019-05-06 DIAGNOSIS — I10 Essential (primary) hypertension: Secondary | ICD-10-CM | POA: Diagnosis not present

## 2019-05-06 DIAGNOSIS — I509 Heart failure, unspecified: Secondary | ICD-10-CM | POA: Diagnosis present

## 2019-05-06 DIAGNOSIS — E785 Hyperlipidemia, unspecified: Secondary | ICD-10-CM | POA: Diagnosis present

## 2019-05-06 DIAGNOSIS — R079 Chest pain, unspecified: Secondary | ICD-10-CM | POA: Diagnosis not present

## 2019-05-06 DIAGNOSIS — J439 Emphysema, unspecified: Secondary | ICD-10-CM | POA: Diagnosis present

## 2019-05-06 DIAGNOSIS — R531 Weakness: Secondary | ICD-10-CM | POA: Diagnosis not present

## 2019-05-06 DIAGNOSIS — K219 Gastro-esophageal reflux disease without esophagitis: Secondary | ICD-10-CM | POA: Diagnosis present

## 2019-05-06 DIAGNOSIS — D500 Iron deficiency anemia secondary to blood loss (chronic): Principal | ICD-10-CM | POA: Diagnosis present

## 2019-05-06 DIAGNOSIS — R51 Headache: Secondary | ICD-10-CM | POA: Diagnosis not present

## 2019-05-06 LAB — BASIC METABOLIC PANEL
Anion gap: 9 (ref 5–15)
Anion gap: 10 (ref 5–15)
BUN: 14 mg/dL (ref 8–23)
BUN: 13 mg/dL (ref 8–23)
CO2: 26 mmol/L (ref 22–32)
CO2: 26 mmol/L (ref 22–32)
Calcium: 8.6 mg/dL — ABNORMAL LOW (ref 8.9–10.3)
Calcium: 8.6 mg/dL — ABNORMAL LOW (ref 8.9–10.3)
Chloride: 100 mmol/L (ref 98–111)
Chloride: 100 mmol/L (ref 98–111)
Creatinine, Ser: 0.69 mg/dL (ref 0.61–1.24)
Creatinine, Ser: 0.75 mg/dL (ref 0.61–1.24)
GFR calc Af Amer: >60 mL/min (ref >60)
GFR calc Af Amer: >60 mL/min (ref >60)
GFR calc non Af Amer: >60 mL/min (ref >60)
GFR calc non Af Amer: >60 mL/min (ref >60)
Glucose, Bld: 148 mg/dL — ABNORMAL HIGH (ref 70–99)
Glucose, Bld: 125 mg/dL — ABNORMAL HIGH (ref 70–99)
Potassium: 3.8 mmol/L (ref 3.5–5.1)
Potassium: 3.8 mmol/L (ref 3.5–5.1)
Sodium: 135 mmol/L (ref 135–145)
Sodium: 136 mmol/L (ref 135–145)

## 2019-05-06 LAB — FERRITIN: Ferritin: 640 ng/mL — ABNORMAL HIGH (ref 24–336)

## 2019-05-06 LAB — CBC WITH DIFFERENTIAL/PLATELET
Band Neutrophils: 0 %
Basophils Absolute: 0 10*3/uL (ref 0.0–0.1)
Basophils Relative: 0 %
Blasts: 0 %
Eosinophils Absolute: 0.3 10*3/uL (ref 0.0–0.5)
Eosinophils Relative: 4 %
HCT: 20.7 % — ABNORMAL LOW (ref 39.0–52.0)
Hemoglobin: 6.3 g/dL — CL (ref 13.0–17.0)
Lymphocytes Relative: 13 %
Lymphs Abs: 0.8 10*3/uL (ref 0.7–4.0)
MCH: 30.1 pg (ref 26.0–34.0)
MCHC: 30.4 g/dL (ref 30.0–36.0)
MCV: 99 fL (ref 80.0–100.0)
Metamyelocytes Relative: 0 %
Monocytes Absolute: 0.5 10*3/uL (ref 0.1–1.0)
Monocytes Relative: 8 %
Myelocytes: 0 %
Neutro Abs: 4.9 10*3/uL (ref 1.7–7.7)
Neutrophils Relative %: 75 %
Other: 0 %
Platelets: 190 10*3/uL (ref 150–400)
Promyelocytes Relative: 0 %
RBC: 2.09 MIL/uL — ABNORMAL LOW (ref 4.22–5.81)
RDW: 19.3 % — ABNORMAL HIGH (ref 11.5–15.5)
WBC: 6.5 10*3/uL (ref 4.0–10.5)
nRBC: 0 % (ref 0.0–0.2)
nRBC: 0 /100 WBC

## 2019-05-06 LAB — CBC
HCT: 20.3 % — ABNORMAL LOW (ref 39.0–52.0)
Hemoglobin: 6.3 g/dL — CL (ref 13.0–17.0)
MCH: 30.9 pg (ref 26.0–34.0)
MCHC: 31 g/dL (ref 30.0–36.0)
MCV: 99.5 fL (ref 80.0–100.0)
Platelets: 169 10*3/uL (ref 150–400)
RBC: 2.04 MIL/uL — ABNORMAL LOW (ref 4.22–5.81)
RDW: 19.4 % — ABNORMAL HIGH (ref 11.5–15.5)
WBC: 5.6 10*3/uL (ref 4.0–10.5)
nRBC: 0.9 % — ABNORMAL HIGH (ref 0.0–0.2)

## 2019-05-06 LAB — HEMOGLOBIN AND HEMATOCRIT, BLOOD
HCT: 25.1 % — ABNORMAL LOW (ref 39.0–52.0)
Hemoglobin: 8.3 g/dL — ABNORMAL LOW (ref 13.0–17.0)

## 2019-05-06 LAB — SARS CORONAVIRUS 2 BY RT PCR (HOSPITAL ORDER, PERFORMED IN ~~LOC~~ HOSPITAL LAB): SARS Coronavirus 2: NEGATIVE

## 2019-05-06 LAB — PREPARE RBC (CROSSMATCH)

## 2019-05-06 LAB — IRON AND TIBC
Iron: 80 ug/dL (ref 45–182)
Saturation Ratios: 32 % (ref 17.9–39.5)
TIBC: 254 ug/dL (ref 250–450)
UIBC: 174 ug/dL

## 2019-05-06 LAB — TROPONIN I: Troponin I: <0.03 ng/mL (ref <0.03)

## 2019-05-06 LAB — VITAMIN B12: Vitamin B-12: 388 pg/mL (ref 180–914)

## 2019-05-06 LAB — GLUCOSE, CAPILLARY: Glucose-Capillary: 119 mg/dL — ABNORMAL HIGH (ref 70–99)

## 2019-05-06 LAB — BRAIN NATRIURETIC PEPTIDE: B Natriuretic Peptide: 330.7 pg/mL — ABNORMAL HIGH (ref 0.0–100.0)

## 2019-05-06 MED ORDER — OCUVITE-LUTEIN PO CAPS
1.0000 | ORAL_CAPSULE | Freq: Two times a day (BID) | ORAL | Status: DC
  Filled 2019-05-06: qty 1

## 2019-05-06 MED ORDER — SODIUM CHLORIDE 0.9% IV SOLUTION
Freq: Once | INTRAVENOUS | Status: AC
  Administered 2019-05-06: 06:00:00 via INTRAVENOUS

## 2019-05-06 MED ORDER — GUAIFENESIN ER 600 MG PO TB12
1200.0000 mg | ORAL_TABLET | Freq: Two times a day (BID) | ORAL | Status: DC
  Administered 2019-05-06 (×2): 1200 mg via ORAL
  Filled 2019-05-06 (×2): qty 2

## 2019-05-06 MED ORDER — ALPRAZOLAM 0.5 MG PO TABS
0.5000 mg | ORAL_TABLET | Freq: Every day | ORAL | Status: DC
  Administered 2019-05-06: 0.5 mg via ORAL
  Filled 2019-05-06: qty 1

## 2019-05-06 MED ORDER — METHOCARBAMOL 500 MG PO TABS
1000.0000 mg | ORAL_TABLET | Freq: Every day | ORAL | Status: DC
  Administered 2019-05-06: 1000 mg via ORAL
  Filled 2019-05-06: qty 2

## 2019-05-06 MED ORDER — IPRATROPIUM-ALBUTEROL 0.5-2.5 (3) MG/3ML IN SOLN
3.0000 mL | RESPIRATORY_TRACT | Status: DC
  Administered 2019-05-06 – 2019-05-07 (×7): 3 mL via RESPIRATORY_TRACT
  Filled 2019-05-06 (×6): qty 3

## 2019-05-06 MED ORDER — ALBUTEROL SULFATE HFA 108 (90 BASE) MCG/ACT IN AERS
4.0000 | INHALATION_SPRAY | RESPIRATORY_TRACT | Status: DC | PRN
  Administered 2019-05-06: 4 via RESPIRATORY_TRACT
  Filled 2019-05-06: qty 6.7

## 2019-05-06 MED ORDER — POLYVINYL ALCOHOL 1.4 % OP SOLN: 1.0000 [drp] | OPHTHALMIC | Status: DC | PRN

## 2019-05-06 MED ORDER — BUDESONIDE 0.25 MG/2ML IN SUSP
0.2500 mg | Freq: Two times a day (BID) | RESPIRATORY_TRACT | Status: DC
  Administered 2019-05-06 – 2019-05-07 (×3): 0.25 mg via RESPIRATORY_TRACT
  Filled 2019-05-06 (×3): qty 2

## 2019-05-06 MED ORDER — SODIUM CHLORIDE 0.9 % IV SOLN
INTRAVENOUS | Status: DC
  Administered 2019-05-06: 05:00:00 via INTRAVENOUS

## 2019-05-06 MED ORDER — ONDANSETRON HCL 4 MG PO TABS: 4.0000 mg | ORAL_TABLET | Freq: Four times a day (QID) | ORAL | Status: DC | PRN

## 2019-05-06 MED ORDER — TRAMADOL HCL 50 MG PO TABS
50.0000 mg | ORAL_TABLET | Freq: Four times a day (QID) | ORAL | Status: DC | PRN
  Administered 2019-05-06: 50 mg via ORAL
  Filled 2019-05-06: qty 1

## 2019-05-06 MED ORDER — SIMVASTATIN 20 MG PO TABS
20.0000 mg | ORAL_TABLET | Freq: Every day | ORAL | Status: DC
  Administered 2019-05-06: 17:00:00 20 mg via ORAL
  Filled 2019-05-06: qty 1

## 2019-05-06 MED ORDER — ACETAMINOPHEN 650 MG RE SUPP: 650.0000 mg | Freq: Four times a day (QID) | RECTAL | Status: DC | PRN

## 2019-05-06 MED ORDER — SODIUM CHLORIDE 0.9 % IV SOLN: 10.0000 mL/h | Freq: Once | INTRAVENOUS | Status: AC

## 2019-05-06 MED ORDER — ALBUTEROL SULFATE 108 (90 BASE) MCG/ACT IN AEPB: 2.0000 | INHALATION_SPRAY | Freq: Four times a day (QID) | RESPIRATORY_TRACT | Status: DC | PRN

## 2019-05-06 MED ORDER — INSULIN ASPART 100 UNIT/ML ~~LOC~~ SOLN: 0.0000 [IU] | Freq: Three times a day (TID) | SUBCUTANEOUS | Status: DC

## 2019-05-06 MED ORDER — VITAMIN C 500 MG PO TABS
1000.0000 mg | ORAL_TABLET | Freq: Every day | ORAL | Status: DC
  Administered 2019-05-06: 1000 mg via ORAL
  Filled 2019-05-06: qty 2

## 2019-05-06 MED ORDER — CHOLECALCIFEROL 10 MCG (400 UNIT) PO TABS
400.0000 [IU] | ORAL_TABLET | Freq: Every day | ORAL | Status: DC
  Administered 2019-05-06: 400 [IU] via ORAL
  Filled 2019-05-06: qty 1

## 2019-05-06 MED ORDER — IPRATROPIUM-ALBUTEROL 0.5-2.5 (3) MG/3ML IN SOLN
3.0000 mL | RESPIRATORY_TRACT | Status: DC | PRN
  Filled 2019-05-06: qty 3

## 2019-05-06 MED ORDER — SALINE SPRAY 0.65 % NA SOLN: 1.0000 | Freq: Every day | NASAL | Status: DC | PRN

## 2019-05-06 MED ORDER — ONDANSETRON HCL 4 MG/2ML IJ SOLN: 4.0000 mg | Freq: Four times a day (QID) | INTRAMUSCULAR | Status: DC | PRN

## 2019-05-06 MED ORDER — POLYETHYLENE GLYCOL 3350 17 G PO PACK
17.0000 g | PACK | Freq: Every day | ORAL | Status: DC
  Administered 2019-05-06: 17 g via ORAL
  Filled 2019-05-06: qty 1

## 2019-05-06 MED ORDER — ACETAMINOPHEN 325 MG PO TABS: 650.0000 mg | ORAL_TABLET | Freq: Four times a day (QID) | ORAL | Status: DC | PRN

## 2019-05-06 MED ORDER — FUROSEMIDE 10 MG/ML IJ SOLN
20.0000 mg | Freq: Once | INTRAMUSCULAR | Status: AC
  Administered 2019-05-06: 20 mg via INTRAVENOUS
  Filled 2019-05-06: qty 2

## 2019-05-06 MED ORDER — POLYETHYL GLYCOL-PROPYL GLYCOL 0.4-0.3 % OP GEL: Freq: Four times a day (QID) | OPHTHALMIC | Status: DC | PRN

## 2019-05-06 MED ORDER — ENOXAPARIN SODIUM 40 MG/0.4ML ~~LOC~~ SOLN: 40.0000 mg | SUBCUTANEOUS | Status: DC

## 2019-05-06 MED ORDER — NITROGLYCERIN 0.4 MG SL SUBL: 0.4000 mg | SUBLINGUAL_TABLET | SUBLINGUAL | Status: DC | PRN

## 2019-05-06 MED ORDER — FUROSEMIDE 40 MG PO TABS: 40.0000 mg | ORAL_TABLET | Freq: Every day | ORAL | Status: DC | PRN

## 2019-05-06 MED ORDER — ENSURE ENLIVE PO LIQD
237.0000 mL | Freq: Two times a day (BID) | ORAL | Status: DC
  Administered 2019-05-06: 237 mL via ORAL

## 2019-05-06 MED ORDER — FLUTICASONE PROPIONATE 50 MCG/ACT NA SUSP: 2.0000 | Freq: Every day | NASAL | Status: DC | PRN

## 2019-05-06 MED ORDER — ALBUTEROL SULFATE (2.5 MG/3ML) 0.083% IN NEBU: 2.5000 mg | INHALATION_SOLUTION | RESPIRATORY_TRACT | Status: DC | PRN

## 2019-05-06 MED ORDER — INSULIN ASPART 100 UNIT/ML ~~LOC~~ SOLN: 0.0000 [IU] | Freq: Every day | SUBCUTANEOUS | Status: DC

## 2019-05-06 MED ORDER — PANTOPRAZOLE SODIUM 40 MG PO TBEC
40.0000 mg | DELAYED_RELEASE_TABLET | Freq: Every day | ORAL | Status: DC
  Administered 2019-05-06: 40 mg via ORAL
  Filled 2019-05-06: qty 1

## 2019-05-06 MED ORDER — PROSIGHT PO TABS
1.0000 | ORAL_TABLET | Freq: Two times a day (BID) | ORAL | Status: DC
  Administered 2019-05-06 (×2): 1 via ORAL
  Filled 2019-05-06 (×2): qty 1

## 2019-05-06 MED ORDER — VITAMIN B-12 1000 MCG PO TABS
1000.0000 ug | ORAL_TABLET | Freq: Every day | ORAL | Status: DC
  Administered 2019-05-06: 1000 ug via ORAL
  Filled 2019-05-06: qty 1

## 2019-05-06 NOTE — ED Notes (Signed)
ED TO INPATIENT HANDOFF REPORT  ED Nurse Name and Phone #: [place] G, [phone]  S Name/Age/Gender [name] 73 y.o. male Room/Bed: RESB/RESB  Code Status   Code Status: Prior  Home/SNF/Other Home Patient oriented to: self, place, time and situation Is this baseline? Yes   Triage Complete: Triage complete  Chief Complaint Low blood count  Triage Note Arrives via EMS from home. C/C generalized weakness. HMG on Wednesday 6.6, hx of blood transfusions. A&O x4.    Allergies Allergies  Allergen Reactions  . Amlodipine Swelling    Level of Care/Admitting Diagnosis ED Disposition    ED Disposition Condition Comment   Admit  Hospital Area: [facility] [[id number]]  Level of Care: Telemetry [5]  Admit to tele based on following criteria: Monitor for Ischemic changes  Covid Evaluation: Confirmed COVID Negative  Diagnosis: Symptomatic anemia [[id number]]  Admitting Physician: Harvie Bridge [[id number]]  Attending Physician: Sherron Monday  Estimated length of stay: past midnight tomorrow  Certification:: I certify this patient will need inpatient services for at least 2 midnights  PT Class (Do Not Modify): Inpatient [101]  PT Acc Code (Do Not Modify): Private [1]       B Medical/Surgery History Past Medical History:  Diagnosis Date  . Anxiety   . Bladder cancer ([place]) 2011  . Borderline diabetes mellitus   . CAD (coronary artery disease)   . Cigarette smoker   . COPD (chronic obstructive pulmonary disease) ([place])   . DJD (degenerative joint disease)   . Emphysema of lung ([place])   . Epistaxis   . History of colonic polyps 2004   hyperplastic   . History of sinusitis   . Hypercholesteremia   . Hypertension   . Obesity    Past Surgical History:  Procedure Laterality Date  . cataract surgery  septemeber 2013  . cataract surgery/sub posterior vitrectomy in left eye  1996   Dr. Zadie Rhine  . PTCA  [phone]     A IV  Location/Drains/Wounds Patient Lines/Drains/Airways Status   Active Line/Drains/Airways    Name:   Placement date:   Placement time:   Site:   Days:   Peripheral IV 05/06/19 Left Antecubital   05/06/19    0053    Antecubital   less than 1   External Urinary Catheter   04/23/19    1256    -   13          Intake/Output Last 24 hours No intake or output data in the 24 hours ending 05/06/19 0357  Labs/Imaging Results for orders placed or performed during the hospital encounter of 05/06/19 (from the past 48 hour(s))  CBC with Differential/Platelet     Status: Abnormal   Collection Time: 05/06/19  1:01 AM  Result Value Ref Range   WBC 6.5 4.0 - 10.5 K/uL   RBC 2.09 (L) 4.22 - 5.81 MIL/uL   Hemoglobin 6.3 (LL) 13.0 - 17.0 g/dL    Comment: CRITICAL RESULT CALLED TO, READ BACK BY AND VERIFIED WITH: Laryssa Hassing,G RN 0125 ON 05/06/2019 JACKSON,K    HCT 20.7 (L) 39.0 - 52.0 %   MCV 99.0 80.0 - 100.0 fL   MCH 30.1 26.0 - 34.0 pg   MCHC 30.4 30.0 - 36.0 g/dL   RDW 19.3 (H) 11.5 - 15.5 %   Platelets 190 150 - 400 K/uL   nRBC 0 0.0 - 0.2 %   Neutrophils Relative % 75 %   Lymphocytes Relative 13 %   Monocytes  Relative 8 %   Eosinophils Relative 4 %   Basophils Relative 0 %   Band Neutrophils 0 %   Metamyelocytes Relative 0 %   Myelocytes 0 %   Promyelocytes Relative 0 %   Blasts 0 %   nRBC 0 0 /100 WBC   Other 0 %   Neutro Abs 4.9 1.7 - 7.7 K/uL   Lymphs Abs 0.8 0.7 - 4.0 K/uL   Monocytes Absolute 0.5 0.1 - 1.0 K/uL   Eosinophils Absolute 0.3 0.0 - 0.5 K/uL   Basophils Absolute 0.0 0.0 - 0.1 K/uL    Comment: Performed at [facility], [place] [address], [place], [place] [address]  Type and screen     Status: None   Collection Time: 05/06/19  1:01 AM  Result Value Ref Range   ABO/RH(D) A POS    Antibody Screen NEG    Sample Expiration      05/09/2019,2359 Performed at [facility], [place] [address], [place], [place] [address]   Basic metabolic  panel     Status: Abnormal   Collection Time: 05/06/19  1:01 AM  Result Value Ref Range   Sodium 135 135 - 145 mmol/L   Potassium 3.8 3.5 - 5.1 mmol/L   Chloride 100 98 - 111 mmol/L   CO2 26 22 - 32 mmol/L   Glucose, Bld 148 (H) 70 - 99 mg/dL   BUN 14 8 - 23 mg/dL   Creatinine, Ser 0.69 0.61 - 1.24 mg/dL   Calcium 8.6 (L) 8.9 - 10.3 mg/dL   GFR calc non Af Amer >60 >60 mL/min   GFR calc Af Amer >60 >60 mL/min   Anion gap 9 5 - 15    Comment: Performed at [facility], [place] [address] [place], [place] [address]  Troponin I - ONCE - STAT     Status: None   Collection Time: 05/06/19  1:01 AM  Result Value Ref Range   Troponin I <0.03 <0.03 ng/mL    Comment: Performed at [facility], [place] [address], [place], [place] [address]  Brain natriuretic peptide     Status: Abnormal   Collection Time: 05/06/19  1:01 AM  Result Value Ref Range   B Natriuretic Peptide 330.7 (H) 0.0 - 100.0 pg/mL    Comment: Performed at [facility], [place] [address] [place], [place] [address]  SARS Coronavirus 2 (CEPHEID - Performed in [place] hospital lab), Hosp Order     Status: None   Collection Time: 05/06/19  1:04 AM   Specimen: Nasopharyngeal Swab  Result Value Ref Range   SARS Coronavirus 2 NEGATIVE NEGATIVE    Comment: (NOTE) If result is NEGATIVE SARS-CoV-2 target nucleic acids are NOT DETECTED. The SARS-CoV-2 RNA is generally detectable in upper and lower  respiratory specimens during the acute phase of infection. The lowest  concentration of SARS-CoV-2 viral copies this assay can detect is 250  copies / mL. A negative result does not preclude SARS-CoV-2 infection  and should not be used as the sole basis for treatment or other  patient management decisions.  A negative result may occur with  improper specimen collection / handling, submission of specimen other  than nasopharyngeal swab, presence of viral mutation(s) within the  areas  targeted by this assay, and inadequate number of viral copies  (<250 copies / mL). A negative result must be combined with clinical  observations, patient history, and epidemiological information. If result is  POSITIVE SARS-CoV-2 target nucleic acids are DETECTED. The SARS-CoV-2 RNA is generally detectable in upper and lower  respiratory specimens dur ing the acute phase of infection.  Positive  results are indicative of active infection with SARS-CoV-2.  Clinical  correlation with patient history and other diagnostic information is  necessary to determine patient infection status.  Positive results do  not rule out bacterial infection or co-infection with other viruses. If result is PRESUMPTIVE POSTIVE SARS-CoV-2 nucleic acids MAY BE PRESENT.   A presumptive positive result was obtained on the submitted specimen  and confirmed on repeat testing.  While 2019 novel coronavirus  (SARS-CoV-2) nucleic acids may be present in the submitted sample  additional confirmatory testing may be necessary for epidemiological  and / or clinical management purposes  to differentiate between  SARS-CoV-2 and other Sarbecovirus currently known to infect humans.  If clinically indicated additional testing with an alternate test  methodology [phone]) is advised. The SARS-CoV-2 RNA is generally  detectable in upper and lower respiratory sp ecimens during the acute  phase of infection. The expected result is Negative. Fact Sheet for Patients:  StrictlyIdeas.no Fact Sheet for Healthcare Providers: BankingDealers.co.za This test is not yet approved or cleared by the [place] FDA and has been authorized for detection and/or diagnosis of SARS-CoV-2 by FDA under an Emergency Use Authorization (EUA).  This EUA will remain in effect (meaning this test can be used) for the duration of the COVID-19 declaration under Section 564(b)(1) of the Act, 21 U.S.C. section  360bbb-3(b)(1), unless the authorization is terminated or revoked sooner. Performed at [facility], [place] [address], [place], [place] [address]    Dg Chest 2 View  Result Date: 05/06/2019 CLINICAL DATA:  Shortness of breath. Weakness. EXAM: CHEST - 2 VIEW COMPARISON:  Radiograph and CT 03/27/2019 FINDINGS: Heart is normal in size. Aortic atherosclerosis. Advanced emphysema with chronic hyperinflation. Multiple bilateral pulmonary nodules. At least 1 of these nodules is larger than on prior exam, nodule in the left mid lung measuring 2.3 cm, previously 1.7 cm. No pulmonary edema. No pneumothorax or pleural effusion. No acute osseous abnormalities. IMPRESSION: 1. Advanced emphysema. 2. Multiple bilateral pulmonary nodules, least 1 of which is increased in size since last month. Emphysema (ICD10-J43.9). Electronically Signed   By: Keith Rake M.D.   On: 05/06/2019 01:48    Pending Labs Unresulted Labs (From admission, onward)    Start     Ordered   05/06/19 0306  Prepare RBC  (Adult Blood Administration - PRBC)  Once,   R    Question Answer Comment  # of Units 2 units   Transfusion Indications Symptomatic Anemia   If emergent release call blood bank Not emergent release      05/06/19 0305   Signed and Held  Basic metabolic panel  Tomorrow morning,   R     Signed and Held   Signed and Held  CBC  Tomorrow morning,   R     Signed and Held   Signed and Held  Occult blood card to lab, stool  As needed,   R     Signed and Held   Signed and Held  Ferritin  Once,   R     Signed and Held   Signed and Held  Iron and TIBC  Once,   R     Signed and Held   Signed and Held  Vitamin B12  Once,   R     Signed and  Held   Signed and Held  Folate RBC  Once,   R     Signed and Held   Signed and Held  CBC  (enoxaparin (LOVENOX)    CrCl >/= 30 ml/min)  Once,   R    Comments: Baseline for enoxaparin therapy IF NOT ALREADY DRAWN.  Notify MD if PLT < 100 K.    Signed and Held    Signed and Held  Creatinine, serum  (enoxaparin (LOVENOX)    CrCl >/= 30 ml/min)  Once,   R    Comments: Baseline for enoxaparin therapy IF NOT ALREADY DRAWN.    Signed and Held   Signed and Held  Creatinine, serum  (enoxaparin (LOVENOX)    CrCl >/= 30 ml/min)  Weekly,   R    Comments: while on enoxaparin therapy    Signed and Held   Signed and Held  Prepare RBC  (Adult Blood Administration - Red Blood Cells)  Once,   R    Question Answer Comment  # of Units 2 units   Transfusion Indications Symptomatic Anemia   Number of Units to Keep Ahead 2 units ahead   If emergent release call blood bank Not emergent release      Signed and Held          Vitals/Pain Today's Vitals   05/06/19 0153 05/06/19 0200 05/06/19 0230 05/06/19 0300  BP: 109/75 122/71 123/73 (!) 143/79  Pulse: (!) 107 (!) 104 (!) 105 (!) 109  Resp: 19 17 20  (!) 21  Temp:      TempSrc:      SpO2: 99% 99% 100% 100%  PainSc:        Isolation Precautions No active isolations  Medications Medications  albuterol (VENTOLIN HFA) 108 (90 Base) MCG/ACT inhaler 4 puff (4 puffs Inhalation Given 05/06/19 0305)  0.9 %  sodium chloride infusion (has no administration in time range)    Mobility walks Low fall risk

## 2019-05-06 NOTE — Progress Notes (Signed)
PT Cancellation Note  Patient Details Name: [name] MRN: [id number] DOB: June 27, 1946   Cancelled Treatment:    Reason Eval/Treat Not Completed: Medical issues which prohibited therapy--low hgb. Pt to receive blood transfusion(s). Will hold PT for now.   Weston Anna, PT Acute Rehabilitation Services Pager: [phone] Office: [phone]

## 2019-05-06 NOTE — ED Provider Notes (Signed)
[facility] DEPT Provider Note   CSN: [id number] Arrival date & time: 05/06/19  0031    History   Chief Complaint Chief Complaint  Patient presents with  . Weakness    HPI [name] is a 73 y.o. male.     Patient presentsPatient presents to the emergency department for evaluation of weakness and shortness of breath.  Patient reports that his hemoglobin is low.  He has a history of bladder cancer requiring periodic blood transfusions.  He had outpatient blood work earlier in the week that showed a hemoglobin of 6.6.  His primary care doctor attempted to get him outpatient blood transfusion but was unable to schedule until next week.  Patient symptoms worsened tonight, so he came to the ER.  He is not experiencing any chest pain.  No fever, cough or chest congestion, but does feel short of breath.  He reports a history of COPD and uses albuterol nebulizers.     Past Medical History:  Diagnosis Date  . Anxiety   . Bladder cancer ([place]) 2011  . Borderline diabetes mellitus   . CAD (coronary artery disease)   . Cigarette smoker   . COPD (chronic obstructive pulmonary disease) ([place])   . DJD (degenerative joint disease)   . Emphysema of lung ([place])   . Epistaxis   . History of colonic polyps 2004   hyperplastic   . History of sinusitis   . Hypercholesteremia   . Hypertension   . Obesity     Patient Active Problem List   Diagnosis Date Noted  . Anemia of chronic disease   . OSA (obstructive sleep apnea)   . SBO (small bowel obstruction) ([place]) 03/27/2019  . Nausea vomiting and diarrhea   . Weakness   . SOB (shortness of breath)   . Elevated troponin   . Palliative care by specialist   . Goals of care, counseling/discussion   . Abdominal pain   . GI bleed 02/18/2019  . DNR (do not resuscitate) 12/21/2018  . Anemia 12/14/2018  . Liver metastasis ([place]) 07/13/2018  . Acute blood loss anemia 06/07/2018  . Rectal bleeding 05/13/2018  .  Chronic diastolic CHF (congestive heart failure) ([place]) 05/13/2018  . Lower GI bleed 05/13/2018  . Class 2 obesity with body mass index (BMI) of 37.0 to 37.9 in adult 01/10/2018  . 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 Acute on chronic diastolic CHF (congestive heart failure) ([place]) 01/19/2017  . Chronic respiratory failure with hypoxia and hypercapnia ([place]) 10/13/2016  . OSA treated with BiPAP 10/13/2016  . Macrocytic anemia 09/24/2016  . COPD with acute exacerbation ([place]) 09/24/2016  . Acute bronchitis 02/03/2016  . Ex-smoker 10/31/2015  . Dilated cardiomyopathy ([place])   . Pulmonary hypertension ([place])   . Acute on chronic respiratory failure with hypoxia and hypercapnia ([place]) 10/20/2015  . 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 LBP (low back pain) 03/24/2011  . Malignant neoplasm of bladder ([place]) 09/22/2010  . HEMATURIA UNSPECIFIED 05/14/2010  . Osteoarthritis 03/28/2010  . Coronary atherosclerosis of native coronary artery 12/19/2007  . Acute sinusitis 12/19/2007  . End stage COPD ([place]) 12/19/2007  . COLONIC POLYPS 12/18/2007  . HYPERCHOLESTEROLEMIA 12/18/2007  . Anxiety 12/18/2007  . Essential hypertension 12/18/2007  . GASTRITIS 12/18/2007  . Impaired fasting glucose 12/18/2007    Past Surgical History:  Procedure Laterality Date  . cataract surgery  septemeber 2013  . cataract surgery/sub posterior vitrectomy in left eye  1996   Dr. Zadie Rhine  .  PTCA  [phone]        Home Medications    Prior to Admission medications   Medication Sig Start Date End Date Taking? Authorizing Provider  Albuterol Sulfate (PROAIR RESPICLICK) 007 (90 Base) MCG/ACT AEPB Inhale 2 puffs into the lungs every 6 (six) hours as needed (shortness of breath). 10/26/18  Yes Noralee Space, MD  ALPRAZolam Duanne Moron) 0.5 MG tablet Take 1 tablet (0.5 mg total) by mouth at bedtime. 10/26/18  Yes Noralee Space, MD  Ascorbic Acid (VITAMIN C) 1000 MG tablet Take 1,000 mg by mouth daily.   Yes  [provider]  budesonide (PULMICORT) 0.25 MG/2ML nebulizer solution Take 2 mLs (0.25 mg total) by nebulization 2 (two) times daily. 02/12/19  Yes Icard, Leory Plowman L, DO  cholecalciferol (VITAMIN D) 400 units TABS tablet Take 400 Units by mouth at bedtime.   Yes [provider]  Flaxseed, Linseed, (FLAX SEEDS PO) Take 1 capsule by mouth daily.   Yes [provider]  fluticasone (FLONASE) 50 MCG/ACT nasal spray Place 2 sprays into both nostrils daily as needed for allergies or rhinitis.  09/21/18  Yes [provider]  furosemide (LASIX) 40 MG tablet Take 40 mg by mouth daily as needed for fluid.    Yes [provider]  guaiFENesin (MUCINEX) 600 MG 12 hr tablet Take 1,200 mg by mouth 2 (two) times daily.   Yes [provider]  ipratropium-albuterol (DUONEB) 0.5-2.5 (3) MG/3ML SOLN Take 3 mLs by nebulization every 4 (four) hours as needed. Patient taking differently: Take 3 mLs by nebulization See admin instructions. Take every 4 hours. Take additional doses every 4 hours as needed for shortness of breath. 02/12/19  Yes Icard, Octavio Graves, DO  methocarbamol (ROBAXIN) 500 MG tablet Take 1,000 mg by mouth at bedtime.  03/07/18  Yes [provider]  multivitamin-lutein (OCUVITE-LUTEIN) CAPS capsule Take 1 capsule by mouth 2 (two) times a day.    Yes [provider]  nitroGLYCERIN (NITROSTAT) 0.4 MG SL tablet Place 1 tablet (0.4 mg total) under the tongue every 5 (five) minutes as needed for chest pain. 11/19/15  Yes Weaver, Scott T, PA-C  ondansetron (ZOFRAN-ODT) 8 MG disintegrating tablet Take 8 mg by mouth every 8 (eight) hours as needed for nausea/vomiting. 04/02/19  Yes [provider]  pantoprazole (PROTONIX) 40 MG tablet Take 40 mg by mouth daily. 03/03/19  Yes [provider]  Polyethyl Glycol-Propyl Glycol (SYSTANE OP) Apply 1 drop to eye every 6 (six) hours as needed (dry eyes).    Yes [provider]   simvastatin (ZOCOR) 20 MG tablet Take 1 tablet (20 mg total) by mouth daily at 6 PM. 11/19/15  Yes Weaver, Nicki Reaper T, PA-C  sodium chloride (OCEAN) 0.65 % SOLN nasal spray Place 1 spray into both nostrils as needed for congestion. Patient taking differently: Place 1 spray into both nostrils daily as needed for congestion.  10/29/15  Yes Cherene Altes, MD  traMADol (ULTRAM) 50 MG tablet Take 1 tablet (50 mg total) by mouth every 6 (six) hours as needed for moderate pain. 03/30/19  Yes Eugenie Filler, MD  vitamin B-12 (CYANOCOBALAMIN) 1000 MCG tablet Take 1,000 mcg by mouth daily.   Yes [provider]    Family History Family History  Problem Relation Age of Onset  . Heart disease Mother   . Heart disease Brother   . Cancer Brother   . Aneurysm Brother     Social History Social History  Tobacco Use  . Smoking status: Former Smoker    Packs/day: 2.00    Years: 45.00    Pack years: 90.00    Types: Cigarettes    Quit date: 10/17/2015    Years since quitting: 3.5  . Smokeless tobacco: Never Used  Substance Use Topics  . Alcohol use: Yes    Alcohol/week: 0.0 standard drinks    Comment: glass of wine at night  . Drug use: No     Allergies   Amlodipine   Review of Systems Review of Systems  Constitutional: Positive for fatigue.  Respiratory: Positive for shortness of breath.   Neurological: Positive for weakness.  All other systems reviewed and are negative.    Physical Exam Updated Vital Signs BP (!) 143/79   Pulse (!) 109   Temp 98.3 F (36.8 C) (Oral)   Resp (!) 21   SpO2 100%   Physical Exam Vitals signs and nursing note reviewed.  Constitutional:      General: He is not in acute distress.    Appearance: He is well-developed. He is ill-appearing.  HENT:     Head: Normocephalic and atraumatic.     Right Ear: Hearing normal.     Left Ear: Hearing normal.     Nose: Nose normal.  Eyes:     Conjunctiva/sclera: Conjunctivae normal.     Pupils:  Pupils are equal, round, and reactive to light.  Neck:     Musculoskeletal: Normal range of motion and neck supple.  Cardiovascular:     Rate and Rhythm: Regular rhythm.     Heart sounds: S1 normal and S2 normal. No murmur. No friction rub. No gallop.   Pulmonary:     Effort: Pulmonary effort is normal. Tachypnea and prolonged expiration present. No respiratory distress.     Breath sounds: Decreased breath sounds present.  Chest:     Chest wall: No tenderness.  Abdominal:     General: Bowel sounds are normal.     Palpations: Abdomen is soft.     Tenderness: There is no abdominal tenderness. There is no guarding or rebound. Negative signs include Murphy's sign and McBurney's sign.     Hernia: No hernia is present.  Musculoskeletal: Normal range of motion.  Skin:    General: Skin is warm and dry.     Findings: No rash.  Neurological:     Mental Status: He is alert and oriented to person, place, and time.     GCS: GCS eye subscore is 4. GCS verbal subscore is 5. GCS motor subscore is 6.     Cranial Nerves: No cranial nerve deficit.     Sensory: No sensory deficit.     Coordination: Coordination normal.  Psychiatric:        Speech: Speech normal.        Behavior: Behavior normal.        Thought Content: Thought content normal.      ED Treatments / Results  Labs (all labs ordered are listed, but only abnormal results are displayed) Labs Reviewed  CBC WITH DIFFERENTIAL/PLATELET - Abnormal; Notable for the following components:      Result Value   RBC 2.09 (*)    Hemoglobin 6.3 (*)    HCT 20.7 (*)    RDW 19.3 (*)    All other components within normal limits  BASIC METABOLIC PANEL - Abnormal; Notable for the following components:   Glucose, Bld 148 (*)    Calcium 8.6 (*)    All  other components within normal limits  BRAIN NATRIURETIC PEPTIDE - Abnormal; Notable for the following components:   B Natriuretic Peptide 330.7 (*)    All other components within normal limits   SARS CORONAVIRUS 2 (HOSPITAL ORDER, PERFORMED IN [facility])  TROPONIN I  TYPE AND SCREEN  PREPARE RBC (CROSSMATCH)    EKG EKG Interpretation  Date/Time:  Sunday May 06 2019 01:43:32 EDT Ventricular Rate:  107 PR Interval:    QRS Duration: 95 QT Interval:  346 QTC Calculation: 464 R Axis:   65 Text Interpretation:  Sinus tachycardia Borderline low voltage, extremity leads No significant change since last tracing Confirmed by Orpah Greek [phone]) on 05/06/2019 1:48:27 AM   Radiology Dg Chest 2 View  Result Date: 05/06/2019 CLINICAL DATA:  Shortness of breath. Weakness. EXAM: CHEST - 2 VIEW COMPARISON:  Radiograph and CT 03/27/2019 FINDINGS: Heart is normal in size. Aortic atherosclerosis. Advanced emphysema with chronic hyperinflation. Multiple bilateral pulmonary nodules. At least 1 of these nodules is larger than on prior exam, nodule in the left mid lung measuring 2.3 cm, previously 1.7 cm. No pulmonary edema. No pneumothorax or pleural effusion. No acute osseous abnormalities. IMPRESSION: 1. Advanced emphysema. 2. Multiple bilateral pulmonary nodules, least 1 of which is increased in size since last month. Emphysema (ICD10-J43.9). Electronically Signed   By: Keith Rake M.D.   On: 05/06/2019 01:48    Procedures Procedures (including critical care time)  Medications Ordered in ED Medications  albuterol (VENTOLIN HFA) 108 (90 Base) MCG/ACT inhaler 4 puff (4 puffs Inhalation Given 05/06/19 0305)  0.9 %  sodium chloride infusion (has no administration in time range)     Initial Impression / Assessment and Plan / ED Course  I have reviewed the triage vital signs and the nursing notes.  Pertinent labs & imaging results that were available during my care of the patient were reviewed by me and considered in my medical decision making (see chart for details).        Patient with history of metastatic bladder cancer presents to the ER for blood  transfusion.  Patient has a history of symptomatic anemia requiring serial transfusions.  He is currently not being treated for his cancer.  Primary oncology is at [facility], but he has not followed up with them for several months.  Patient has been seen by his primary doctor, identified as having anemia earlier this week but they were unable to get him outpatient transfusion.  He has worsened tonight.  I believe his worsening is multifactorial.  He has a history of COPD and this is likely causing his shortness of breath.  He has not desaturating, but is on supplemental oxygen.  Patient uses CPAP at night.  He also has a history of congestive heart failure, will require monitoring for blood transfusion for volume overload.  He will therefore be admitted for further management of his symptomatic anemia.  Final Clinical Impressions(s) / ED Diagnoses   Final diagnoses:  Symptomatic anemia    ED Discharge Orders    None       , Gwenyth Allegra, MD 05/06/19 [id number]

## 2019-05-06 NOTE — ED Notes (Signed)
Critical lab result: HMG 6.3

## 2019-05-06 NOTE — H&P (Signed)
History and Physical   [facility] [place] [facility] [place] Admission History and Physical [employer], D.O.    Patient Name: [name] MR#: [id number] Date of Birth: 03/17/1946 Date of Admission: 05/06/2019  Referring MD/NP/PA: Dr. Betsey Holiday Primary Care Physician: [name], MD  Chief Complaint:  Chief Complaint  Patient presents with  . Weakness    HPI: Kalman Nylen is a 73 y.o. male with a known history of metastatic bladder cancer, CAD, CHF, COPD, DM presents to the emergency department for evaluation of SOB, weakness.  Patient was told by physician that hemoglobin was low on bloodwork done earlier this week but was unable to schedule a transfusion until next week. He was having rectal bleeding 2/2 constipation earlier this month but has not followed with GI and does not intend to have another colonoscopy.  He says he is no longer bleeding. He has had inconsistent follow up with oncology and has not been on chemo since March.  He does not plan to go back on chemo.    Otherwise there has been no change in status. Patient has been taking medication as prescribed and there has been no recent change in medication or diet.  No recent antibiotics.  There has been no recent illness, travel or sick contacts.    EMS/ED Course: Patient received nebulizer. Medical admission has been requested for further management of symptomatic anemia.  Review of Systems:  CONSTITUTIONAL: Positive weakness No fever/chills, weight gain/loss, headache. EYES: No blurry or double vision. ENT: No tinnitus, postnasal drip, redness or soreness of the oropharynx. RESPIRATORY: Positive dyspnea. No cough, dyspnea, wheeze.  No hemoptysis.  CARDIOVASCULAR: No chest pain, palpitations, syncope, orthopnea. No lower extremity edema.  GASTROINTESTINAL: No nausea, vomiting, abdominal pain, diarrhea, constipation.  No hematemesis, melena or hematochezia. GENITOURINARY: No dysuria, frequency,  hematuria. ENDOCRINE: No polyuria or nocturia. No heat or cold intolerance. HEMATOLOGY: No anemia, bruising, bleeding. INTEGUMENTARY: No rashes, ulcers, lesions. MUSCULOSKELETAL: No arthritis, gout NEUROLOGIC: No numbness, tingling, ataxia, seizure-type activity, weakness. PSYCHIATRIC: No anxiety, depression, insomnia.   Past Medical History:  Diagnosis Date  . Anxiety   . Bladder cancer ([place]) 2011  . Borderline diabetes mellitus   . CAD (coronary artery disease)   . Cigarette smoker   . COPD (chronic obstructive pulmonary disease) ([place])   . DJD (degenerative joint disease)   . Emphysema of lung ([place])   . Epistaxis   . History of colonic polyps 2004   hyperplastic   . History of sinusitis   . Hypercholesteremia   . Hypertension   . Obesity     Past Surgical History:  Procedure Laterality Date  . cataract surgery  septemeber 2013  . cataract surgery/sub posterior vitrectomy in left eye  1996   Dr. Zadie Rhine  . [facility]  [phone]     reports that he quit smoking about 3 years ago. His smoking use included cigarettes. He has a 90.00 pack-year smoking history. He has never used smokeless tobacco. He reports current alcohol use. He reports that he does not use drugs.  Allergies  Allergen Reactions  . Amlodipine Swelling    Family History  Problem Relation Age of Onset  . Heart disease Mother   . Heart disease Brother   . Cancer Brother   . Aneurysm Brother     Prior to Admission medications   Medication Sig Start Date End Date Taking? Authorizing Provider  Albuterol Sulfate (PROAIR RESPICLICK) 195 (90 Base) MCG/ACT AEPB Inhale 2 puffs into the lungs  every 6 (six) hours as needed (shortness of breath). 10/26/18  Yes Noralee Space, MD  ALPRAZolam Duanne Moron) 0.5 MG tablet Take 1 tablet (0.5 mg total) by mouth at bedtime. 10/26/18  Yes Noralee Space, MD  Ascorbic Acid (VITAMIN C) 1000 MG tablet Take 1,000 mg by mouth daily.   Yes [provider]  budesonide  (PULMICORT) 0.25 MG/2ML nebulizer solution Take 2 mLs (0.25 mg total) by nebulization 2 (two) times daily. 02/12/19  Yes Icard, Leory Plowman L, DO  cholecalciferol (VITAMIN D) 400 units TABS tablet Take 400 Units by mouth at bedtime.   Yes [provider]  Flaxseed, Linseed, (FLAX SEEDS PO) Take 1 capsule by mouth daily.   Yes [provider]  fluticasone (FLONASE) 50 MCG/ACT nasal spray Place 2 sprays into both nostrils daily as needed for allergies or rhinitis.  09/21/18  Yes [provider]  furosemide (LASIX) 40 MG tablet Take 40 mg by mouth daily as needed for fluid.    Yes [provider]  guaiFENesin (MUCINEX) 600 MG 12 hr tablet Take 1,200 mg by mouth 2 (two) times daily.   Yes [provider]  ipratropium-albuterol (DUONEB) 0.5-2.5 (3) MG/3ML SOLN Take 3 mLs by nebulization every 4 (four) hours as needed. Patient taking differently: Take 3 mLs by nebulization See admin instructions. Take every 4 hours. Take additional doses every 4 hours as needed for shortness of breath. 02/12/19  Yes Icard, Octavio Graves, DO  methocarbamol (ROBAXIN) 500 MG tablet Take 1,000 mg by mouth at bedtime.  03/07/18  Yes [provider]  multivitamin-lutein (OCUVITE-LUTEIN) CAPS capsule Take 1 capsule by mouth 2 (two) times a day.    Yes [provider]  nitroGLYCERIN (NITROSTAT) 0.4 MG SL tablet Place 1 tablet (0.4 mg total) under the tongue every 5 (five) minutes as needed for chest pain. 11/19/15  Yes Weaver, Scott T, PA-C  ondansetron (ZOFRAN-ODT) 8 MG disintegrating tablet Take 8 mg by mouth every 8 (eight) hours as needed for nausea/vomiting. 04/02/19  Yes [provider]  pantoprazole (PROTONIX) 40 MG tablet Take 40 mg by mouth daily. 03/03/19  Yes [provider]  Polyethyl Glycol-Propyl Glycol (SYSTANE OP) Apply 1 drop to eye every 6 (six) hours as needed (dry eyes).    Yes [provider]  simvastatin (ZOCOR) 20 MG tablet Take 1 tablet  (20 mg total) by mouth daily at 6 PM. 11/19/15  Yes Weaver, Nicki Reaper T, PA-C  sodium chloride (OCEAN) 0.65 % SOLN nasal spray Place 1 spray into both nostrils as needed for congestion. Patient taking differently: Place 1 spray into both nostrils daily as needed for congestion.  10/29/15  Yes Cherene Altes, MD  traMADol (ULTRAM) 50 MG tablet Take 1 tablet (50 mg total) by mouth every 6 (six) hours as needed for moderate pain. 03/30/19  Yes Eugenie Filler, MD  vitamin B-12 (CYANOCOBALAMIN) 1000 MCG tablet Take 1,000 mcg by mouth daily.   Yes [provider]    Physical Exam: Vitals:   05/06/19 0153 05/06/19 0200 05/06/19 0230 05/06/19 0300  BP: 109/75 122/71 123/73 (!) 143/79  Pulse: (!) 107 (!) 104 (!) 105 (!) 109  Resp: 19 17 20  (!) 21  Temp:      TempSrc:      SpO2: 99% 99% 100% 100%    GENERAL: 73 y.o.-year-old male patient, well-developed, well-nourished lying in the bed in no acute distress.  Pleasant and cooperative.   HEENT: Head atraumatic, normocephalic. Pupils equal. Mucus membranes  moist. NECK: Supple. No JVD. CHEST: Scattered wheezes, pursed lip breathing, Speaking in full sentences.  No rales, rhonchi or crackles. No use of accessory muscles of respiration.   CARDIOVASCULAR: S1, S2 normal. No murmurs, rubs, or gallops. Cap refill <2 seconds. Pulses intact distally.  ABDOMEN: Soft, nondistended, nontender. No rebound, guarding, rigidity. Normoactive bowel sounds present in all four quadrants.  EXTREMITIES: No pedal edema, cyanosis, or clubbing. No calf tenderness or Homan's sign.  NEUROLOGIC: The patient is alert and oriented x 3. Cranial nerves II through XII are grossly intact with no focal sensorimotor deficit. PSYCHIATRIC:  Normal affect, mood, thought content. SKIN: Warm, dry, and intact without obvious rash, lesion, or ulcer.    Labs on Admission:  CBC: Recent Labs  Lab 05/06/19 0101  WBC 6.5  NEUTROABS 4.9  HGB 6.3*  HCT 20.7*  MCV 99.0  PLT 161    Basic Metabolic Panel: Recent Labs  Lab 05/06/19 0101  NA 135  K 3.8  CL 100  CO2 26  GLUCOSE 148*  BUN 14  CREATININE 0.69  CALCIUM 8.6*   GFR: Estimated Creatinine Clearance: 87.7 mL/min (by C-G formula based on SCr of 0.69 mg/dL). Liver Function Tests: No results for input(s): AST, ALT, ALKPHOS, BILITOT, PROT, ALBUMIN in the last 168 hours. No results for input(s): LIPASE, AMYLASE in the last 168 hours. No results for input(s): AMMONIA in the last 168 hours. Coagulation Profile: No results for input(s): INR, PROTIME in the last 168 hours. Cardiac Enzymes: Recent Labs  Lab 05/06/19 0101  TROPONINI <0.03   BNP (last 3 results) No results for input(s): PROBNP in the last 8760 hours. HbA1C: No results for input(s): HGBA1C in the last 72 hours. CBG: No results for input(s): GLUCAP in the last 168 hours. Lipid Profile: No results for input(s): CHOL, HDL, LDLCALC, TRIG, CHOLHDL, LDLDIRECT in the last 72 hours. Thyroid Function Tests: No results for input(s): TSH, T4TOTAL, FREET4, T3FREE, THYROIDAB in the last 72 hours. Anemia Panel: No results for input(s): VITAMINB12, FOLATE, FERRITIN, TIBC, IRON, RETICCTPCT in the last 72 hours. Urine analysis:    Component Value Date/Time   COLORURINE YELLOW 02/18/2019 1319   APPEARANCEUR CLEAR 02/18/2019 1319   LABSPEC 1.020 02/18/2019 1319   PHURINE 5.0 02/18/2019 1319   GLUCOSEU NEGATIVE 02/18/2019 1319   GLUCOSEU NEGATIVE 06/30/2010 1050   HGBUR NEGATIVE 02/18/2019 1319   BILIRUBINUR NEGATIVE 02/18/2019 1319   KETONESUR 5 (A) 02/18/2019 1319   PROTEINUR NEGATIVE 02/18/2019 1319   UROBILINOGEN 0.2 06/30/2010 1050   NITRITE NEGATIVE 02/18/2019 1319   LEUKOCYTESUR NEGATIVE 02/18/2019 1319   Sepsis Labs: @LABRCNTIP (procalcitonin:4,lacticidven:4) ) Recent Results (from the past 240 hour(s))  SARS Coronavirus 2 (CEPHEID - Performed in [place] hospital lab), Hosp Order     Status: None   Collection Time: 05/06/19  1:04  AM   Specimen: Nasopharyngeal Swab  Result Value Ref Range Status   SARS Coronavirus 2 NEGATIVE NEGATIVE Final    Comment: (NOTE) If result is NEGATIVE SARS-CoV-2 target nucleic acids are NOT DETECTED. The SARS-CoV-2 RNA is generally detectable in upper and lower  respiratory specimens during the acute phase of infection. The lowest  concentration of SARS-CoV-2 viral copies this assay can detect is 250  copies / mL. A negative result does not preclude SARS-CoV-2 infection  and should not be used as the sole basis for treatment or other  patient management decisions.  A negative result may occur with  improper specimen collection / handling, submission of specimen other  than nasopharyngeal swab, presence of viral mutation(s) within the  areas targeted by this assay, and inadequate number of viral copies  (<250 copies / mL). A negative result must be combined with clinical  observations, patient history, and epidemiological information. If result is POSITIVE SARS-CoV-2 target nucleic acids are DETECTED. The SARS-CoV-2 RNA is generally detectable in upper and lower  respiratory specimens dur ing the acute phase of infection.  Positive  results are indicative of active infection with SARS-CoV-2.  Clinical  correlation with patient history and other diagnostic information is  necessary to determine patient infection status.  Positive results do  not rule out bacterial infection or co-infection with other viruses. If result is PRESUMPTIVE POSTIVE SARS-CoV-2 nucleic acids MAY BE PRESENT.   A presumptive positive result was obtained on the submitted specimen  and confirmed on repeat testing.  While 2019 novel coronavirus  (SARS-CoV-2) nucleic acids may be present in the submitted sample  additional confirmatory testing may be necessary for epidemiological  and / or clinical management purposes  to differentiate between  SARS-CoV-2 and other Sarbecovirus currently known to infect humans.   If clinically indicated additional testing with an alternate test  methodology [phone]) is advised. The SARS-CoV-2 RNA is generally  detectable in upper and lower respiratory sp ecimens during the acute  phase of infection. The expected result is Negative. Fact Sheet for Patients:  StrictlyIdeas.no Fact Sheet for Healthcare Providers: BankingDealers.co.za This test is not yet approved or cleared by the [place] FDA and has been authorized for detection and/or diagnosis of SARS-CoV-2 by FDA under an Emergency Use Authorization (EUA).  This EUA will remain in effect (meaning this test can be used) for the duration of the COVID-19 declaration under Section 564(b)(1) of the Act, 21 U.S.C. section 360bbb-3(b)(1), unless the authorization is terminated or revoked sooner. Performed at [facility], [place] [address], [place], [place] [address]      Radiological Exams on Admission: Dg Chest 2 View  Result Date: 05/06/2019 CLINICAL DATA:  Shortness of breath. Weakness. EXAM: CHEST - 2 VIEW COMPARISON:  Radiograph and CT 03/27/2019 FINDINGS: Heart is normal in size. Aortic atherosclerosis. Advanced emphysema with chronic hyperinflation. Multiple bilateral pulmonary nodules. At least 1 of these nodules is larger than on prior exam, nodule in the left mid lung measuring 2.3 cm, previously 1.7 cm. No pulmonary edema. No pneumothorax or pleural effusion. No acute osseous abnormalities. IMPRESSION: 1. Advanced emphysema. 2. Multiple bilateral pulmonary nodules, least 1 of which is increased in size since last month. Emphysema (ICD10-J43.9). Electronically Signed   By: Keith Rake M.D.   On: 05/06/2019 01:48    EKG: Sinus tach at 107 bpm with normal axis and nonspecific ST-T wave changes.   Assessment/Plan  This is a 73 y.o. male with a history of metastatic bladder cancer, CADm CHF, COPD, DM now being admitted with:  #.  .Symptomatic normocytic anemia, ? Blood loss 2/2 GIB - Admit - Transfuse 2 units - Lasix between units - Follow up CBC - Check ferritin, B12, folate, iron, FOBT - Will hold Lovenox and use SCDs only for now for DVT px.   #. History of COPD - Continue Pulmicort, Flonase, nebs  #. History of HLD  - Continue Zocor  #. History of GERD - Continue Protonix  #. History of CHF - Continue Lasix  #. History of H/o Diabetes - Accuchecks achs with RISS coverage - Heart healthy, carb controlled diet  Admission status: IP, tele IV Fluids: NS  Diet/Nutrition: Heart healthy carb controlled.  Consults called: None  DVT Px: SCDs and early ambulation. Code Status: Full Code  Disposition Plan: To home in 1-2 days  All the records are reviewed and case discussed with ED provider. Management plans discussed with the patient and/or family who express understanding and agree with plan of care.  Jomari Bartnik D.O. on 05/06/2019 at 3:25 AM CC: Primary care physician; [name], MD   05/06/2019, 3:25 AM

## 2019-05-06 NOTE — ED Triage Notes (Signed)
Arrives via EMS from home. C/C generalized weakness. HMG on Wednesday 6.6, hx of blood transfusions. A&O x4.

## 2019-05-06 NOTE — ED Notes (Signed)
Report given to Richelle, RN.

## 2019-05-06 NOTE — ED Notes (Signed)
Bed: RESB Expected date:  Expected time:  Means of arrival:  Comments: EMS 73 yo male from home states Hgb 6.6-had transfusion 2 weeks ago-patient told EMS he felt like he needs another blood transfusion

## 2019-05-06 NOTE — Progress Notes (Signed)
PROGRESS NOTE    KANNEN MOXEY  VZD:[id number] DOB: 01-16-1946 DOA: 05/06/2019 PCP: [name], MD   Brief Narrative: [name] is [name] 73 y.o. male with Andrewjames Weirauch known history of metastatic bladder cancer, CAD, CHF, COPD, DM presents to the emergency department for evaluation of SOB, weakness.  Patient was told by physician that hemoglobin was low on bloodwork done earlier this week but was unable to schedule Lamond Glantz transfusion until next week. He was having rectal bleeding 2/2 constipation earlier this month but has not followed with GI and does not intend to have another colonoscopy.  He says he is no longer bleeding. He has had inconsistent follow up with oncology and has not been on chemo since March.  He does not plan to go back on chemo.    Otherwise there has been no change in status. Patient has been taking medication as prescribed and there has been no recent change in medication or diet.  No recent antibiotics.  There has been no recent illness, travel or sick contacts.    Assessment & Plan:   Active Problems:   Symptomatic anemia  This is [name] 73 y.o. male with [name] history of metastatic spindle cell sarcoma, CAD, CHF, COPD, DM now being admitted with:  #. Symptomatic normocytic anemia, ? Blood loss 2/2 GIB - s/p 2 units pRBC's - trend CBC - Check ferritin, B12, folate, iron, FOBT -> labs c/w AOCD - he denies any recent GI blood loss and declines evaluation by surgery or GI - his plan is for intermittent transfusions going forward as needed  # Goals of care: not interested in hospice at this point.  He's followed by palliative care.  He notes at this point he plans for intermittent transfusions as long as he has reasonable quality of life.   #. History of COPD - Continue Pulmicort, Flonase, nebs  #. History of HLD  - Continue Zocor  #. History of GERD - Continue Protonix  #. History of CHF - Continue Lasix  #. History of H/o Diabetes - Accuchecks achs with RISS  coverage - Heart healthy, carb controlled diet  DVT prophylaxis: SCD's Code Status: DNR, discussed with pt today Family Communication: none at bedside Disposition Plan: pending  Consultants:   none  Procedures:   none  Antimicrobials:  Anti-infectives (From admission, onward)   None     Subjective: Feels ok, better after transfusion Long discussion about goc  Objective: Vitals:   05/06/19 1039 05/06/19 1113 05/06/19 1332 05/06/19 1545  BP: (!) 119/58 125/67 (!) 120/55   Pulse: 89 94 89   Resp: 19 (!) 24 (!) 22   Temp: 99.3 F (37.4 C) 98.4 F (36.9 C) 98.9 F (37.2 C)   TempSrc: Oral Oral Oral   SpO2: 100% 100% 100% 99%  Weight:      Height:        Intake/Output Summary (Last 24 hours) at 05/06/2019 1637 Last data filed at 05/06/2019 1332 Gross per 24 hour  Intake 2261.85 ml  Output 1200 ml  Net 1061.85 ml   Filed Weights   05/06/19 0429  Weight: 90.9 kg    Examination:  General exam: Appears calm and comfortable  Respiratory system: Clear to auscultation. Respiratory effort normal. 6 L [place]. Cardiovascular system: S1 & S2 heard, RRR.  Gastrointestinal system: Abdomen is nondistended, soft and nontender Central nervous system: Alert and oriented. No focal neurological deficits. Extremities: mild LEE Skin: No rashes, lesions or ulcers Psychiatry: Judgement and insight appear normal.  Mood & affect appropriate.     Data Reviewed: I have personally reviewed following labs and imaging studies  CBC: Recent Labs  Lab 05/06/19 0101 05/06/19 0510 05/06/19 1512  WBC 6.5 5.6  --   NEUTROABS 4.9  --   --   HGB 6.3* 6.3* 8.3*  HCT 20.7* 20.3* 25.1*  MCV 99.0 99.5  --   PLT 190 169  --    Basic Metabolic Panel: Recent Labs  Lab 05/06/19 0101 05/06/19 0510  NA 135 136  K 3.8 3.8  CL 100 100  CO2 26 26  GLUCOSE 148* 125*  BUN 14 13  CREATININE 0.69 0.75  CALCIUM 8.6* 8.6*   GFR: Estimated Creatinine Clearance: 85.5 mL/min (by C-G formula  based on SCr of 0.75 mg/dL). Liver Function Tests: No results for input(s): AST, ALT, ALKPHOS, BILITOT, PROT, ALBUMIN in the last 168 hours. No results for input(s): LIPASE, AMYLASE in the last 168 hours. No results for input(s): AMMONIA in the last 168 hours. Coagulation Profile: No results for input(s): INR, PROTIME in the last 168 hours. Cardiac Enzymes: Recent Labs  Lab 05/06/19 0101  TROPONINI <0.03   BNP (last 3 results) No results for input(s): PROBNP in the last 8760 hours. HbA1C: No results for input(s): HGBA1C in the last 72 hours. CBG: Recent Labs  Lab 05/06/19 0734  GLUCAP 119*   Lipid Profile: No results for input(s): CHOL, HDL, LDLCALC, TRIG, CHOLHDL, LDLDIRECT in the last 72 hours. Thyroid Function Tests: No results for input(s): TSH, T4TOTAL, FREET4, T3FREE, THYROIDAB in the last 72 hours. Anemia Panel: Recent Labs    05/06/19 0510  VITAMINB12 388  FERRITIN 640*  TIBC 254  IRON 80   Sepsis Labs: No results for input(s): PROCALCITON, LATICACIDVEN in the last 168 hours.  Recent Results (from the past 240 hour(s))  SARS Coronavirus 2 (CEPHEID - Performed in [place] hospital lab), Hosp Order     Status: None   Collection Time: 05/06/19  1:04 AM   Specimen: Nasopharyngeal Swab  Result Value Ref Range Status   SARS Coronavirus 2 NEGATIVE NEGATIVE Final    Comment: (NOTE) If result is NEGATIVE SARS-CoV-2 target nucleic acids are NOT DETECTED. The SARS-CoV-2 RNA is generally detectable in upper and lower  respiratory specimens during the acute phase of infection. The lowest  concentration of SARS-CoV-2 viral copies this assay can detect is 250  copies / mL. Bader Stubblefield negative result does not preclude SARS-CoV-2 infection  and should not be used as the sole basis for treatment or other  patient management decisions.  Hudson Lehmkuhl negative result may occur with  improper specimen collection / handling, submission of specimen other  than nasopharyngeal swab, presence of  viral mutation(s) within the  areas targeted by this assay, and inadequate number of viral copies  (<250 copies / mL). Latonya Nelon negative result must be combined with clinical  observations, patient history, and epidemiological information. If result is POSITIVE SARS-CoV-2 target nucleic acids are DETECTED. The SARS-CoV-2 RNA is generally detectable in upper and lower  respiratory specimens dur ing the acute phase of infection.  Positive  results are indicative of active infection with SARS-CoV-2.  Clinical  correlation with patient history and other diagnostic information is  necessary to determine patient infection status.  Positive results do  not rule out bacterial infection or co-infection with other viruses. If result is PRESUMPTIVE POSTIVE SARS-CoV-2 nucleic acids MAY BE PRESENT.   Milia Warth presumptive positive result was obtained on the submitted specimen  and  confirmed on repeat testing.  While 2019 novel coronavirus  (SARS-CoV-2) nucleic acids may be present in the submitted sample  additional confirmatory testing may be necessary for epidemiological  and / or clinical management purposes  to differentiate between  SARS-CoV-2 and other Sarbecovirus currently known to infect humans.  If clinically indicated additional testing with an alternate test  methodology [id number]) is advised. The SARS-CoV-2 RNA is generally  detectable in upper and lower respiratory sp ecimens during the acute  phase of infection. The expected result is Negative. Fact Sheet for Patients:  StrictlyIdeas.no Fact Sheet for Healthcare Providers: BankingDealers.co.za This test is not yet approved or cleared by the [place] FDA and has been authorized for detection and/or diagnosis of SARS-CoV-2 by FDA under an Emergency Use Authorization (EUA).  This EUA will remain in effect (meaning this test can be used) for the duration of the COVID-19 declaration under Section  564(b)(1) of the Act, 21 U.S.C. section 360bbb-3(b)(1), unless the authorization is terminated or revoked sooner. Performed at [facility], [place] [address], [place], [place] [address]          Radiology Studies: Dg Chest 2 View  Result Date: 05/06/2019 CLINICAL DATA:  Shortness of breath. Weakness. EXAM: CHEST - 2 VIEW COMPARISON:  Radiograph and CT 03/27/2019 FINDINGS: Heart is normal in size. Aortic atherosclerosis. Advanced emphysema with chronic hyperinflation. Multiple bilateral pulmonary nodules. At least 1 of these nodules is larger than on prior exam, nodule in the left mid lung measuring 2.3 cm, previously 1.7 cm. No pulmonary edema. No pneumothorax or pleural effusion. No acute osseous abnormalities. IMPRESSION: 1. Advanced emphysema. 2. Multiple bilateral pulmonary nodules, least 1 of which is increased in size since last month. Emphysema (ICD10-J43.9). Electronically Signed   By: Keith Rake M.D.   On: 05/06/2019 01:48        Scheduled Meds:  ALPRAZolam  0.5 mg Oral QHS   budesonide  0.25 mg Nebulization BID   cholecalciferol  400 Units Oral QHS   feeding supplement (ENSURE ENLIVE)  237 mL Oral BID BM   guaiFENesin  1,200 mg Oral BID   ipratropium-albuterol  3 mL Nebulization Q4H WA   methocarbamol  1,000 mg Oral QHS   multivitamin  1 tablet Oral BID   pantoprazole  40 mg Oral Daily   polyethylene glycol  17 g Oral Daily   simvastatin  20 mg Oral q1800   vitamin B-12  1,000 mcg Oral Daily   vitamin C  1,000 mg Oral Daily   Continuous Infusions:  sodium chloride 75 mL/hr at 05/06/19 0615     LOS: 0 days    Time spent: over 30 min    Fayrene Helper, MD [facility] Pager AMION  If 7PM-7AM, please contact night-coverage [email or web address] Password [facility] 05/06/2019, 4:37 PM

## 2019-05-06 NOTE — Progress Notes (Signed)
Patient refusing to have Nitro tablets removed from his bedside. They are unopened and in a bag with his Cross necklace. Educated patient on why we do not like medications at bedside. Patient stated he would d/c in AM and refused to have meds sent to pharmacy. Made night RN aware of this.

## 2019-05-07 ENCOUNTER — Telehealth: Payer: Self-pay | Admitting: *Deleted

## 2019-05-07 DIAGNOSIS — D500 Iron deficiency anemia secondary to blood loss (chronic): Secondary | ICD-10-CM | POA: Diagnosis not present

## 2019-05-07 DIAGNOSIS — C679 Malignant neoplasm of bladder, unspecified: Secondary | ICD-10-CM | POA: Diagnosis not present

## 2019-05-07 DIAGNOSIS — Z1159 Encounter for screening for other viral diseases: Secondary | ICD-10-CM | POA: Diagnosis not present

## 2019-05-07 DIAGNOSIS — C787 Secondary malignant neoplasm of liver and intrahepatic bile duct: Secondary | ICD-10-CM | POA: Diagnosis not present

## 2019-05-07 DIAGNOSIS — D649 Anemia, unspecified: Secondary | ICD-10-CM | POA: Diagnosis not present

## 2019-05-07 DIAGNOSIS — E785 Hyperlipidemia, unspecified: Secondary | ICD-10-CM | POA: Diagnosis not present

## 2019-05-07 DIAGNOSIS — E119 Type 2 diabetes mellitus without complications: Secondary | ICD-10-CM | POA: Diagnosis not present

## 2019-05-07 LAB — BPAM RBC
Blood Product Expiration Date: 202007092359
Blood Product Expiration Date: 202007092359
ISSUE DATE / TIME: 202006210557
ISSUE DATE / TIME: 202006211045
Unit Type and Rh: 6200
Unit Type and Rh: 6200

## 2019-05-07 LAB — COMPREHENSIVE METABOLIC PANEL
ALT: 34 U/L (ref 0–44)
AST: 37 U/L (ref 15–41)
Albumin: 2.9 g/dL — ABNORMAL LOW (ref 3.5–5.0)
Alkaline Phosphatase: 79 U/L (ref 38–126)
Anion gap: 7 (ref 5–15)
BUN: 13 mg/dL (ref 8–23)
CO2: 26 mmol/L (ref 22–32)
Calcium: 8.4 mg/dL — ABNORMAL LOW (ref 8.9–10.3)
Chloride: 105 mmol/L (ref 98–111)
Creatinine, Ser: 0.76 mg/dL (ref 0.61–1.24)
GFR calc Af Amer: >60 mL/min (ref >60)
GFR calc non Af Amer: >60 mL/min (ref >60)
Glucose, Bld: 102 mg/dL — ABNORMAL HIGH (ref 70–99)
Potassium: 4 mmol/L (ref 3.5–5.1)
Sodium: 138 mmol/L (ref 135–145)
Total Bilirubin: 1.2 mg/dL (ref 0.3–1.2)
Total Protein: 5.9 g/dL — ABNORMAL LOW (ref 6.5–8.1)

## 2019-05-07 LAB — TYPE AND SCREEN
ABO/RH(D): A POS
Antibody Screen: NEGATIVE
Unit division: 0
Unit division: 0

## 2019-05-07 LAB — FOLATE RBC
Folate, Hemolysate: 371 ng/mL
Folate, RBC: 1922 ng/mL (ref >498)
Hematocrit: 19.3 % — ABNORMAL LOW (ref 37.5–51.0)

## 2019-05-07 LAB — CBC
HCT: 25.3 % — ABNORMAL LOW (ref 39.0–52.0)
Hemoglobin: 7.8 g/dL — ABNORMAL LOW (ref 13.0–17.0)
MCH: 29.9 pg (ref 26.0–34.0)
MCHC: 30.8 g/dL (ref 30.0–36.0)
MCV: 96.9 fL (ref 80.0–100.0)
Platelets: 145 10*3/uL — ABNORMAL LOW (ref 150–400)
RBC: 2.61 MIL/uL — ABNORMAL LOW (ref 4.22–5.81)
RDW: 18.7 % — ABNORMAL HIGH (ref 11.5–15.5)
WBC: 4.4 10*3/uL (ref 4.0–10.5)
nRBC: 0.7 % — ABNORMAL HIGH (ref 0.0–0.2)

## 2019-05-07 NOTE — Plan of Care (Signed)
  Problem: Health Behavior/Discharge Planning: Goal: Ability to manage health-related needs will improve Outcome: Adequate for Discharge   Problem: Clinical Measurements: Goal: Ability to maintain clinical measurements within normal limits will improve Outcome: Adequate for Discharge Goal: Will remain free from infection Outcome: Adequate for Discharge Goal: Diagnostic test results will improve Outcome: Adequate for Discharge Goal: Respiratory complications will improve Outcome: Adequate for Discharge Goal: Cardiovascular complication will be avoided Outcome: Adequate for Discharge   Problem: Nutrition: Goal: Adequate nutrition will be maintained Outcome: Adequate for Discharge   Problem: Coping: Goal: Level of anxiety will decrease Outcome: Adequate for Discharge   Problem: Elimination: Goal: Will not experience complications related to bowel motility Outcome: Adequate for Discharge Goal: Will not experience complications related to urinary retention Outcome: Adequate for Discharge

## 2019-05-07 NOTE — Progress Notes (Signed)
PT Cancellation Note / Screen  Patient Details Name: [name] MRN: [id number] DOB: [birth year]   Cancelled Treatment:    Reason Eval/Treat Not Completed: PT screened, no needs identified, will sign off Pt reports he has been discharged and declines PT at this time.  Pt just wants to go home.  Since pt declines, PT to sign off.   Malita Ignasiak,KATHrine E 05/07/2019, 9:48 AM Carmelia Bake, PT, DPT Acute Rehabilitation Services Office: [phone] Pager: [phone]

## 2019-05-07 NOTE — Discharge Summary (Signed)
Physician Discharge Summary  [name] WLN:[id number] DOB: 09/25/1946 DOA: 05/06/2019  PCP: [name], MD  Admit date: 05/06/2019 Discharge date: 05/07/2019  Time spent: 40 minutes  Recommendations for Outpatient Follow-up:  1. Follow up outpatient CBC/CMP 2. Follow up with palliative care outpatient    Discharge Diagnoses:  Active Problems:   Symptomatic anemia   Discharge Condition: stable  Diet recommendation: heart healthy  Filed Weights   05/06/19 0429  Weight: 90.9 kg    History of present illness:  [name] [name] 73 y.o.malewith [name] known history of metastatic bladder cancer, CAD,CHF, COPD, DMpresents to the emergency department for evaluation of SOB, weakness. Patient was told by physician that hemoglobin was low on bloodwork done earlier this week but was unable to schedule [name] transfusion until next week.He was having rectal bleeding 2/2 constipation earlier this month but has not followed with GI and does not intend to have another colonoscopy. He says he is no longer bleeding.He has had inconsistent follow up with oncology and has not been on chemo since March. He does not plan to go back on chemo.  Otherwise there has been no change in status. Patient has been taking medication as prescribed and there has been no recent change in medication or diet. No recent antibiotics. There has been no recent illness, travel or sick contacts  He was admitted for anemia and received 2 units pRBC.  He was discharged with stable hb.  No si/sx of bleeding.  He's followed by palliative care outpatient.  He's not interested in hospice at this point.  Plans to continue prn transfusions as long as he has reasonable quality of life.  Hospital Course:  This is a73 y.o.malewith [name] history of metastatic spindle cell sarcoma, CAD, CHF, COPD, DMnow being admitted with:  #.Symptomatic normocytic anemia, ? Blood loss 2/2 GIB - s/p 2 units pRBC's - trend CBC - Check  ferritin, B12, folate, iron, FOBT -> labs c/w AOCD - he denies any recent GI blood loss and declines evaluation by surgery or GI - his plan is for intermittent transfusions going forward as needed  # Goals of care: not interested in hospice at this point.  He's followed by palliative care.  He notes at this point he plans for intermittent transfusions as long as he has reasonable quality of life.   #.History of COPD - Continue Pulmicort, Flonase, nebs  #.History of HLD - Continue Zocor  #. History ofGERD - ContinueProtonix  #. History ofCHF - ContinueLasix  #. History ofH/o Diabetes - Accuchecks achs with RISS coverage - Heart healthy, carb controlled diet  Procedures:  none  Consultations:  none  Discharge Exam: Vitals:   05/07/19 0328 05/07/19 0558  BP:  122/78  Pulse:  (!) 115  Resp:  19  Temp:  97.7 F (36.5 C)  SpO2: 99% 90%   Feels ready to go home. No complaints. No LH.  General: No acute distress. Cardiovascular: Heart sounds show [name] regular rate, and rhythm.  Lungs: Clear to auscultation bilaterally.  6 L [name]. Abdomen: Soft, nontender, nondistended Neurological: Alert and oriented 3. Moves all extremities 4. Cranial nerves II through XII grossly intact. Skin: Warm and dry. No rashes or lesions. Extremities: No clubbing or cyanosis. 1+ LEE. Psychiatric: Mood and affect are normal. Insight and judgment are appropriate.[name]  Discharge Instructions   Discharge Instructions    (HEART FAILURE PATIENTS) Call MD:  Anytime you have any of the following symptoms: 1) 3 pound weight gain in 24 hours  or 5 pounds in 1 week 2) shortness of breath, with or without [name] dry hacking cough 3) swelling in the hands, feet or stomach 4) if you have to sleep on extra pillows at night in order to breathe.   Complete by: As directed    Call MD for:  difficulty breathing, headache or visual disturbances   Complete by: As directed    Call MD for:  extreme fatigue    Complete by: As directed    Call MD for:  hives   Complete by: As directed    Call MD for:  persistant dizziness or light-headedness   Complete by: As directed    Call MD for:  persistant nausea and vomiting   Complete by: As directed    Call MD for:  redness, tenderness, or signs of infection (pain, swelling, redness, odor or green/yellow discharge around incision site)   Complete by: As directed    Call MD for:  severe uncontrolled pain   Complete by: As directed    Call MD for:  temperature >100.4   Complete by: As directed    Diet - low sodium heart healthy   Complete by: As directed    Discharge instructions   Complete by: As directed    You were seen for low blood counts.  You received 2 units of blood and your hemoglobin today is 7.8.  Please follow up with your PCP for [name] repeat lab check this week.  Continue to follow with palliative care as an outpatient.   Return for new, recurrent, or worsening symptoms.  Please ask your PCP to request records from this hospitalization so they know what was done and what the next steps will be.   Increase activity slowly   Complete by: As directed      Allergies as of 05/07/2019      Reactions   Amlodipine Swelling      Medication List    TAKE these medications   Albuterol Sulfate 108 (90 Base) MCG/ACT Aepb Commonly known as: ProAir RespiClick Inhale 2 puffs into the lungs every 6 (six) hours as needed (shortness of breath).   ALPRAZolam 0.5 MG tablet Commonly known as: XANAX Take 1 tablet (0.5 mg total) by mouth at bedtime.   budesonide 0.25 MG/2ML nebulizer solution Commonly known as: PULMICORT Take 2 mLs (0.25 mg total) by nebulization 2 (two) times daily.   cholecalciferol 10 MCG (400 UNIT) Tabs tablet Commonly known as: VITAMIN D3 Take 400 Units by mouth at bedtime.   FLAX SEEDS PO Take 1 capsule by mouth daily.   fluticasone 50 MCG/ACT nasal spray Commonly known as: FLONASE Place 2 sprays into both  nostrils daily as needed for allergies or rhinitis.   furosemide 40 MG tablet Commonly known as: LASIX Take 40 mg by mouth daily as needed for fluid.   guaiFENesin 600 MG 12 hr tablet Commonly known as: MUCINEX Take 1,200 mg by mouth 2 (two) times daily.   ipratropium-albuterol 0.5-2.5 (3) MG/3ML Soln Commonly known as: DUONEB Take 3 mLs by nebulization every 4 (four) hours as needed. What changed:   when to take this  additional instructions   methocarbamol 500 MG tablet Commonly known as: ROBAXIN Take 1,000 mg by mouth at bedtime.   multivitamin-lutein Caps capsule Take 1 capsule by mouth 2 (two) times Bryli Mantey day.   nitroGLYCERIN 0.4 MG SL tablet Commonly known as: NITROSTAT Place 1 tablet (0.4 mg total) under the tongue every 5 (five) minutes as needed for  chest pain.   ondansetron 8 MG disintegrating tablet Commonly known as: ZOFRAN-ODT Take 8 mg by mouth every 8 (eight) hours as needed for nausea/vomiting.   pantoprazole 40 MG tablet Commonly known as: PROTONIX Take 40 mg by mouth daily.   simvastatin 20 MG tablet Commonly known as: ZOCOR Take 1 tablet (20 mg total) by mouth daily at 6 PM.   sodium chloride 0.65 % Soln nasal spray Commonly known as: OCEAN Place 1 spray into both nostrils as needed for congestion. What changed: when to take this   SYSTANE OP Apply 1 drop to eye every 6 (six) hours as needed (dry eyes).   traMADol 50 MG tablet Commonly known as: ULTRAM Take 1 tablet (50 mg total) by mouth every 6 (six) hours as needed for moderate pain.   vitamin B-12 1000 MCG tablet Commonly known as: CYANOCOBALAMIN Take 1,000 mcg by mouth daily.   vitamin C 1000 MG tablet Take 1,000 mg by mouth daily.      Allergies  Allergen Reactions  . Amlodipine Swelling      The results of significant diagnostics from this hospitalization (including imaging, microbiology, ancillary and laboratory) are listed below for reference.    Significant Diagnostic  Studies: Dg Chest 2 View  Result Date: 05/06/2019 CLINICAL DATA:  Shortness of breath. Weakness. EXAM: CHEST - 2 VIEW COMPARISON:  Radiograph and CT 03/27/2019 FINDINGS: Heart is normal in size. Aortic atherosclerosis. Advanced emphysema with chronic hyperinflation. Multiple bilateral pulmonary nodules. At least 1 of these nodules is larger than on prior exam, nodule in the left mid lung measuring 2.3 cm, previously 1.7 cm. No pulmonary edema. No pneumothorax or pleural effusion. No acute osseous abnormalities. IMPRESSION: 1. Advanced emphysema. 2. Multiple bilateral pulmonary nodules, least 1 of which is increased in size since last month. Emphysema (ICD10-J43.9). Electronically Signed   By: Keith Rake M.D.   On: 05/06/2019 01:48    Microbiology: Recent Results (from the past 240 hour(s))  SARS Coronavirus 2 (CEPHEID - Performed in [place] hospital lab), Hosp Order     Status: None   Collection Time: 05/06/19  1:04 AM   Specimen: Nasopharyngeal Swab  Result Value Ref Range Status   SARS Coronavirus 2 NEGATIVE NEGATIVE Final    Comment: (NOTE) If result is NEGATIVE SARS-CoV-2 target nucleic acids are NOT DETECTED. The SARS-CoV-2 RNA is generally detectable in upper and lower  respiratory specimens during the acute phase of infection. The lowest  concentration of SARS-CoV-2 viral copies this assay can detect is 250  copies / mL. Sharnell Knight negative result does not preclude SARS-CoV-2 infection  and should not be used as the sole basis for treatment or other  patient management decisions.  Jaishon Krisher negative result may occur with  improper specimen collection / handling, submission of specimen other  than nasopharyngeal swab, presence of viral mutation(s) within the  areas targeted by this assay, and inadequate number of viral copies  (<250 copies / mL). Sarahi Borland negative result must be combined with clinical  observations, patient history, and epidemiological information. If result is  POSITIVE SARS-CoV-2 target nucleic acids are DETECTED. The SARS-CoV-2 RNA is generally detectable in upper and lower  respiratory specimens dur ing the acute phase of infection.  Positive  results are indicative of active infection with SARS-CoV-2.  Clinical  correlation with patient history and other diagnostic information is  necessary to determine patient infection status.  Positive results do  not rule out bacterial infection or co-infection with other viruses. If result  is PRESUMPTIVE POSTIVE SARS-CoV-2 nucleic acids MAY BE PRESENT.   Muna Demers presumptive positive result was obtained on the submitted specimen  and confirmed on repeat testing.  While 2019 novel coronavirus  (SARS-CoV-2) nucleic acids may be present in the submitted sample  additional confirmatory testing may be necessary for epidemiological  and / or clinical management purposes  to differentiate between  SARS-CoV-2 and other Sarbecovirus currently known to infect humans.  If clinically indicated additional testing with an alternate test  methodology [id number]) is advised. The SARS-CoV-2 RNA is generally  detectable in upper and lower respiratory sp ecimens during the acute  phase of infection. The expected result is Negative. Fact Sheet for Patients:  StrictlyIdeas.no Fact Sheet for Healthcare Providers: BankingDealers.co.za This test is not yet approved or cleared by the [place] FDA and has been authorized for detection and/or diagnosis of SARS-CoV-2 by FDA under an Emergency Use Authorization (EUA).  This EUA will remain in effect (meaning this test can be used) for the duration of the COVID-19 declaration under Section 564(b)(1) of the Act, 21 U.S.C. section 360bbb-3(b)(1), unless the authorization is terminated or revoked sooner. Performed at [facility], [place] [address] [place], [place] [address]      Labs: Basic Metabolic Panel: Recent  Labs  Lab 05/06/19 0101 05/06/19 0510 05/07/19 0514  NA 135 136 138  K 3.8 3.8 4.0  CL 100 100 105  CO2 26 26 26   GLUCOSE 148* 125* 102*  BUN 14 13 13   CREATININE 0.69 0.75 0.76  CALCIUM 8.6* 8.6* 8.4*   Liver Function Tests: Recent Labs  Lab 05/07/19 0514  AST 37  ALT 34  ALKPHOS 79  BILITOT 1.2  PROT 5.9*  ALBUMIN 2.9*   No results for input(s): LIPASE, AMYLASE in the last 168 hours. No results for input(s): AMMONIA in the last 168 hours. CBC: Recent Labs  Lab 05/06/19 0101 05/06/19 0510 05/06/19 1512 05/07/19 0514  WBC 6.5 5.6  --  4.4  NEUTROABS 4.9  --   --   --   HGB 6.3* 6.3* 8.3* 7.8*  HCT 20.7* 20.3* 25.1* 25.3*  MCV 99.0 99.5  --  96.9  PLT 190 169  --  145*   Cardiac Enzymes: Recent Labs  Lab 05/06/19 0101  TROPONINI <0.03   BNP: BNP (last 3 results) Recent Labs    02/18/19 1215 05/06/19 0101  BNP 215.3* 330.7*    ProBNP (last 3 results) No results for input(s): PROBNP in the last 8760 hours.  CBG: Recent Labs  Lab 05/06/19 0734  GLUCAP 119*       Signed:  Fayrene Helper MD.  [facility] 05/07/2019, 9:15 AM

## 2019-05-07 NOTE — Telephone Encounter (Signed)
Contacted residence and spoke with patient's wife, [name], to arrange a home visit this week. I noticed prior to calling that patient was discharged from [facility] this am after receiving 2 units of PRBCs. Home visit scheduled for tomorrow, 05/08/19 at 10:30am.

## 2019-05-08 ENCOUNTER — Other Ambulatory Visit: Payer: Self-pay

## 2019-05-08 ENCOUNTER — Other Ambulatory Visit: Payer: Medicare Other | Admitting: *Deleted

## 2019-05-08 ENCOUNTER — Inpatient Hospital Stay (HOSPITAL_COMMUNITY)
Admission: RE | Admit: 2019-05-08 | Discharge: 2019-05-08 | Disposition: A | Discharge status: Home / Self Care | Payer: Medicare Other | Source: Ambulatory Visit | Attending: Internal Medicine | Admitting: Internal Medicine

## 2019-05-08 DIAGNOSIS — Z515 Encounter for palliative care: Secondary | ICD-10-CM | POA: Diagnosis not present

## 2019-05-09 DIAGNOSIS — C3490 Malignant neoplasm of unspecified part of unspecified bronchus or lung: Secondary | ICD-10-CM | POA: Diagnosis not present

## 2019-05-11 DIAGNOSIS — J449 Chronic obstructive pulmonary disease, unspecified: Secondary | ICD-10-CM | POA: Diagnosis not present

## 2019-05-11 DIAGNOSIS — C499 Malignant neoplasm of connective and soft tissue, unspecified: Secondary | ICD-10-CM | POA: Diagnosis not present

## 2019-05-11 DIAGNOSIS — I509 Heart failure, unspecified: Secondary | ICD-10-CM | POA: Diagnosis not present

## 2019-05-11 DIAGNOSIS — I110 Hypertensive heart disease with heart failure: Secondary | ICD-10-CM | POA: Diagnosis not present

## 2019-05-11 DIAGNOSIS — E785 Hyperlipidemia, unspecified: Secondary | ICD-10-CM | POA: Diagnosis not present

## 2019-05-11 DIAGNOSIS — K219 Gastro-esophageal reflux disease without esophagitis: Secondary | ICD-10-CM | POA: Diagnosis not present

## 2019-05-11 DIAGNOSIS — D62 Acute posthemorrhagic anemia: Secondary | ICD-10-CM | POA: Diagnosis not present

## 2019-05-15 ENCOUNTER — Ambulatory Visit (HOSPITAL_COMMUNITY)
Admission: RE | Admit: 2019-05-15 | Discharge: 2019-05-15 | Disposition: A | Discharge status: Home / Self Care | Payer: Medicare Other | Source: Ambulatory Visit | Attending: Internal Medicine | Admitting: Internal Medicine

## 2019-05-15 ENCOUNTER — Other Ambulatory Visit: Payer: Self-pay

## 2019-05-15 DIAGNOSIS — D649 Anemia, unspecified: Secondary | ICD-10-CM | POA: Diagnosis not present

## 2019-05-15 LAB — PREPARE RBC (CROSSMATCH)

## 2019-05-15 MED ORDER — FUROSEMIDE 10 MG/ML IJ SOLN
20.0000 mg | Freq: Once | INTRAMUSCULAR | Status: AC
  Administered 2019-05-15: 20 mg via INTRAVENOUS
  Filled 2019-05-15: qty 2

## 2019-05-15 MED ORDER — SODIUM CHLORIDE 0.9% IV SOLUTION
Freq: Once | INTRAVENOUS | Status: AC
  Administered 2019-05-15: 11:00:00 via INTRAVENOUS

## 2019-05-15 NOTE — Discharge Instructions (Signed)
Blood Transfusion, Adult, Care After This sheet gives you information about how to care for yourself after your procedure. Your doctor may also give you more specific instructions. If you have problems or questions, contact your doctor. Follow these instructions at home:   Take over-the-counter and prescription medicines only as told by your doctor.  Go back to your normal activities as told by your doctor.  Follow instructions from your doctor about how to take care of the area where an IV tube was put into your vein (insertion site). Make sure you: ? Wash your hands with soap and water before you change your bandage (dressing). If there is no soap and water, use hand sanitizer. ? Change your bandage as told by your doctor.  Check your IV insertion site every day for signs of infection. Check for: ? More redness, swelling, or pain. ? More fluid or blood. ? Warmth. ? Pus or a bad smell. Contact a doctor if:  You have more redness, swelling, or pain around the IV insertion site.  You have more fluid or blood coming from the IV insertion site.  Your IV insertion site feels warm to the touch.  You have pus or a bad smell coming from the IV insertion site.  Your pee (urine) turns pink, red, or brown.  You feel weak after doing your normal activities. Get help right away if:  You have signs of a serious allergic or body defense (immune) system reaction, including: ? Itchiness. ? Hives. ? Trouble breathing. ? Anxiety. ? Pain in your chest or lower back. ? Fever, flushing, and chills. ? Fast pulse. ? Rash. ? Watery poop (diarrhea). ? Throwing up (vomiting). ? Dark pee. ? Serious headache. ? Dizziness. ? Stiff neck. ? Yellow color in your face or the white parts of your eyes (jaundice). Summary  After a blood transfusion, return to your normal activities as told by your doctor.  Every day, check for signs of infection where the IV tube was put into your vein.  Some  signs of infection are warm skin, more redness and pain, more fluid or blood, and pus or a bad smell where the needle went in.  Contact your doctor if you feel weak or have any unusual symptoms. This information is not intended to replace advice given to you by your health care provider. Make sure you discuss any questions you have with your health care provider. Document Released: 11/22/2014 Document Revised: 03/08/2018 Document Reviewed: 06/25/2016 Elsevier Patient Education  2020 [employer]  

## 2019-05-15 NOTE — Progress Notes (Signed)
COMMUNITY PALLIATIVE CARE RN NOTE  PATIENT NAME: [name] DOB: [birth year] MRN: [id number]  PRIMARY CARE PROVIDER: [name], MD  RESPONSIBLE PARTY:  Acct ID - Guarantor Home Phone Work Phone Relationship Acct Type  [id number] [name]  Self P/F     [address], [place], [place] [address]   Covid Pre-screening Negative  PLAN OF CARE and INTERVENTION:  1. ADVANCE CARE PLANNING/GOALS OF CARE: Goal is for patient to remain at home with his wife. He is a DNR. 2. PATIENT/CAREGIVER EDUCATION: Safe Mob[employer], Energy Conservation and Pain Management 3. DISEASE STATUS: Met with patient and his wife, [name], in their home on the porch. Patient just returning to the porch after using the bathroom. He is dyspneic, but is able to recover in about 60 seconds. He remains on Oxygen at 6L/min via [place]. He just returned home yesterday morning from [facility] after receiving a blood transfusion. He states that his blood transfusion was scheduled for today, however over the weekend he began experiencing increased weakness and shortness of breath and knew that his Hgb had dropped again. It was 6.3. He received 2 Units of PRBCs and Hgb increased to 8.3. He denies any active GI bleeding over the past 2 weeks, usually noted after having a BM where his stools are dark red/black. He has an appointment for [facility] tomorrow and a Telehealth visit with his PCP this Friday. He denies pain. Abdomen distended. He has difficulty bending over so wife continues to assist with dressing and sponge baths. He states that he has been feeling hungry lately and now has a good appetite. In the past he mainly only ate because he knew he needed to. He denies pain. 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 He still does not feel that he is ready to transfer to hospice services. He is realistic and states  that he knows that he will eventually need hospice at some point. He is very strong willed and says that he is not ready to die. Will continue to monitor.   HISTORY OF PRESENT ILLNESS:  This is a 73 yo male who resides at home with his wife. Palliative Care Team continues to follow patient. Will visit patient every 2 weeks and PRN.   CODE STATUS: DNR ADVANCED DIRECTIVES: N MOST FORM: no PPS: 50%   PHYSICAL EXAM:   VITALS: Today's Vitals   05/08/19 1206  BP: 124/73  Pulse: 85  Resp: 20  Temp: 97.6 F (36.4 C)  TempSrc: Temporal  SpO2: 98%  PainSc: 0-No pain    LUNGS: clear to auscultation  CARDIAC: Cor RRR EXTREMITIES: Trace edema in bilateral lower extremities SKIN: Exposed skin is dry and intact  NEURO: Alert and oriented x 4, forgetful, generalized weakness, ambulatory w/o assistive devices   (Duration of visit and documentation 75 minutes)    Andrew Eastern, RN BSN

## 2019-05-15 NOTE — Progress Notes (Signed)
                   Patient Care Center Note   Diagnosis: Anemia    Provider: [name], MD     Procedure: 1 unit of PRBC    Note: Patient received 1 unit PRBC over four hours via PIV; also received 20 mg of IV lasix. Tolerated well. Vitals stable. Discharge instrucitons given, including not to remove blue (blood bank ID) wrist band. Patient is to return tomorrow, Wednesday, 7/1 for second bag of PRBC. Patient verbalized understanding. Patient alert and oriented and discharge via wheelchair with wife.  Otho Bellows, RN

## 2019-05-16 ENCOUNTER — Other Ambulatory Visit: Payer: Self-pay

## 2019-05-16 ENCOUNTER — Ambulatory Visit (HOSPITAL_COMMUNITY)
Admission: RE | Admit: 2019-05-16 | Discharge: 2019-05-16 | Disposition: A | Discharge status: Home / Self Care | Payer: Medicare Other | Source: Ambulatory Visit | Attending: Internal Medicine | Admitting: Internal Medicine

## 2019-05-16 DIAGNOSIS — D649 Anemia, unspecified: Secondary | ICD-10-CM | POA: Diagnosis not present

## 2019-05-16 LAB — PREPARE RBC (CROSSMATCH)

## 2019-05-16 MED ORDER — FUROSEMIDE 10 MG/ML IJ SOLN
20.0000 mg | Freq: Once | INTRAMUSCULAR | Status: AC
  Administered 2019-05-16: 20 mg via INTRAVENOUS
  Filled 2019-05-16: qty 2

## 2019-05-16 MED ORDER — SODIUM CHLORIDE 0.9% IV SOLUTION
Freq: Once | INTRAVENOUS | Status: AC
  Administered 2019-05-16: 09:00:00 via INTRAVENOUS

## 2019-05-16 NOTE — Discharge Instructions (Signed)
Blood Transfusion, Adult, Care After This sheet gives you information about how to care for yourself after your procedure. Your doctor may also give you more specific instructions. If you have problems or questions, contact your doctor. Follow these instructions at home:   Take over-the-counter and prescription medicines only as told by your doctor.  Go back to your normal activities as told by your doctor.  Follow instructions from your doctor about how to take care of the area where an IV tube was put into your vein (insertion site). Make sure you: ? Wash your hands with soap and water before you change your bandage (dressing). If there is no soap and water, use hand sanitizer. ? Change your bandage as told by your doctor.  Check your IV insertion site every day for signs of infection. Check for: ? More redness, swelling, or pain. ? More fluid or blood. ? Warmth. ? Pus or a bad smell. Contact a doctor if:  You have more redness, swelling, or pain around the IV insertion site.  You have more fluid or blood coming from the IV insertion site.  Your IV insertion site feels warm to the touch.  You have pus or a bad smell coming from the IV insertion site.  Your pee (urine) turns pink, red, or brown.  You feel weak after doing your normal activities. Get help right away if:  You have signs of a serious allergic or body defense (immune) system reaction, including: ? Itchiness. ? Hives. ? Trouble breathing. ? Anxiety. ? Pain in your chest or lower back. ? Fever, flushing, and chills. ? Fast pulse. ? Rash. ? Watery poop (diarrhea). ? Throwing up (vomiting). ? Dark pee. ? Serious headache. ? Dizziness. ? Stiff neck. ? Yellow color in your face or the white parts of your eyes (jaundice). Summary  After a blood transfusion, return to your normal activities as told by your doctor.  Every day, check for signs of infection where the IV tube was put into your vein.  Some  signs of infection are warm skin, more redness and pain, more fluid or blood, and pus or a bad smell where the needle went in.  Contact your doctor if you feel weak or have any unusual symptoms. This information is not intended to replace advice given to you by your health care provider. Make sure you discuss any questions you have with your health care provider. Document Released: 11/22/2014 Document Revised: 03/08/2018 Document Reviewed: 06/25/2016 Elsevier Patient Education  2020 [employer]  

## 2019-05-16 NOTE — Progress Notes (Signed)
PATIENT CARE CENTER NOTE  Diagnosis: Anemia    Provider: [name], MD   Procedure: 1 unit PRBC   Note: Patient received second unit of blood. First unit was given yesterday. Blood transfused over 4 hours per order. 20 mg IV Lasix given post-transfusion.Tolerated well. Vital signs stable. Discharge instructions given. Patient alert, oriented and ambulatory to wheelchair at discharge.

## 2019-05-17 DIAGNOSIS — K922 Gastrointestinal hemorrhage, unspecified: Secondary | ICD-10-CM | POA: Diagnosis not present

## 2019-05-17 LAB — BPAM RBC
Blood Product Expiration Date: 202007142359
Blood Product Expiration Date: 202007142359
ISSUE DATE / TIME: 202006301025
ISSUE DATE / TIME: 202007010901
Unit Type and Rh: 6200
Unit Type and Rh: 6200

## 2019-05-17 LAB — TYPE AND SCREEN
ABO/RH(D): A POS
Antibody Screen: NEGATIVE
Unit division: 0
Unit division: 0

## 2019-05-21 ENCOUNTER — Telehealth: Payer: Self-pay | Admitting: *Deleted

## 2019-05-21 NOTE — Telephone Encounter (Signed)
Contacted and spoke with patient's wife, [name], to arrange a home visit for this week. Visit scheduled for tomorrow, 05/22/2019 at 11a.

## 2019-05-22 ENCOUNTER — Other Ambulatory Visit: Payer: Medicare Other | Admitting: *Deleted

## 2019-05-22 ENCOUNTER — Other Ambulatory Visit: Payer: Self-pay

## 2019-05-22 DIAGNOSIS — Z515 Encounter for palliative care: Secondary | ICD-10-CM

## 2019-05-22 NOTE — Progress Notes (Signed)
COMMUNITY PALLIATIVE CARE RN NOTE  PATIENT NAME: [name] DOB: 10-Jan-1946 MRN: [id number]  PRIMARY CARE PROVIDER: [name], MD  RESPONSIBLE PARTY:  Acct ID - Guarantor Home Phone Work Phone Relationship Acct Type  [id number] Andrew [phone]  Self P/F     [address], [place], [place] [address]   Covid-19 Pre-screening Negative  PLAN OF CARE and INTERVENTION:  1. ADVANCE CARE PLANNING/GOALS OF CARE: Goal is to remain at home with his wife and avoid hospitalizations. He is a DNR. 2. PATIENT/CAREGIVER EDUCATION: Symptom Management 3. DISEASE STATUS: Met with patient and his wife, [name] at their home. Upon arrival, patient is sitting in a chair on his porch. He is very pleasant and engaging and reports feeling good today. He denies pain at this time. He does experience pain at times in his R abdomen and PRN Tramadol is effective in managing this pain. He continues on Oxygen at 6L/min via . No dyspnea noted during visit. He does experience some dyspnea with exertion, but recovers within about 30 seconds while at rest. He continues on Duonebs four times daily. Xanax remains effective for anxiety and to ease his breathing. He recently received a blood transfusion as an Outpatient. He went on 6/30 and 7/1. As outpatient, patient explained that they can only infuse 1 unit of PRBCs per day since his PCP wants it ran over a 4 hour period. After his transfusion, his Hgb was 9.4. His appetite continues to improve. He is sleeping well throughout the night. He did have some blood noted in his stool on 05/18/19, but no other occurrences since then. He remains ambulatory without assistive devices. He does require some assistance with bathing and dressing (putting his pants on), as he is unable to bend d/t his abdominal distention. He did voice some concerns regarding periodic elevations in his HR. He says at times his HR goes up in the 120s-130s. He requested that I contact his PCPs office and  speak with his NP to ask if he could be started on Metoprolol to help control his heart rate. I left a voicemail and received a call back from [name], [place]. I provided her with patient's vital signs and assessment from today. They are ordering a STAT CBC for patient tomorrow since they are concerned that his Hgb may be dropping again, causing the spikes in his HR. [name] to contact patient directly to inform him of this information. Vital signs noted below. Will continue to monitor.   HISTORY OF PRESENT ILLNESS:  This is a 73 yo male who resides at home with his wife. Palliative Care Team continues to follow patient. Will visit patient every 2 weeks and PRN.  CODE STATUS: DNR ADVANCED DIRECTIVES: N MOST FORM: no PPS: 50%   PHYSICAL EXAM:   VITALS: Today's Vitals   05/22/19 1206  BP: 132/69  Pulse: 87  Resp: 18  Temp: (!) 97.5 F (36.4 C)  TempSrc: Temporal  SpO2: 99%  PainSc: 0-No pain    LUNGS: decreased breath sounds CARDIAC: Cor RRR EXTREMITIES: Trace edema to bilateral feet SKIN: Exposed skin is dry and intact, normal appearance  NEURO: Alert and oriented x 3, pleasant mood, generalized weakness, ambulatory   (Duration of visit and documentation 75 minutes)    Andrew Eastern, RN BSN

## 2019-05-23 DIAGNOSIS — D62 Acute posthemorrhagic anemia: Secondary | ICD-10-CM | POA: Diagnosis not present

## 2019-05-26 ENCOUNTER — Other Ambulatory Visit: Payer: Self-pay

## 2019-05-26 ENCOUNTER — Emergency Department (HOSPITAL_COMMUNITY): Payer: Medicare Other

## 2019-05-26 ENCOUNTER — Encounter (HOSPITAL_COMMUNITY): Payer: Self-pay | Admitting: Emergency Medicine

## 2019-05-26 ENCOUNTER — Inpatient Hospital Stay (HOSPITAL_COMMUNITY)
Admission: EM | Admit: 2019-05-26 | Discharge: 2019-06-02 | DRG: 871 | Disposition: A | Discharge status: Home Health | Payer: Medicare Other | Attending: Internal Medicine | Admitting: Internal Medicine

## 2019-05-26 DIAGNOSIS — A419 Sepsis, unspecified organism: Secondary | ICD-10-CM | POA: Diagnosis present

## 2019-05-26 DIAGNOSIS — R6520 Severe sepsis without septic shock: Secondary | ICD-10-CM | POA: Diagnosis not present

## 2019-05-26 DIAGNOSIS — C679 Malignant neoplasm of bladder, unspecified: Secondary | ICD-10-CM | POA: Diagnosis present

## 2019-05-26 DIAGNOSIS — G4733 Obstructive sleep apnea (adult) (pediatric): Secondary | ICD-10-CM | POA: Diagnosis present

## 2019-05-26 DIAGNOSIS — I5033 Acute on chronic diastolic (congestive) heart failure: Secondary | ICD-10-CM | POA: Diagnosis not present

## 2019-05-26 DIAGNOSIS — R7989 Other specified abnormal findings of blood chemistry: Secondary | ICD-10-CM | POA: Diagnosis present

## 2019-05-26 DIAGNOSIS — I959 Hypotension, unspecified: Secondary | ICD-10-CM | POA: Diagnosis not present

## 2019-05-26 DIAGNOSIS — Z209 Contact with and (suspected) exposure to unspecified communicable disease: Secondary | ICD-10-CM | POA: Diagnosis not present

## 2019-05-26 DIAGNOSIS — Z7951 Long term (current) use of inhaled steroids: Secondary | ICD-10-CM

## 2019-05-26 DIAGNOSIS — E785 Hyperlipidemia, unspecified: Secondary | ICD-10-CM | POA: Diagnosis present

## 2019-05-26 DIAGNOSIS — I110 Hypertensive heart disease with heart failure: Secondary | ICD-10-CM | POA: Diagnosis present

## 2019-05-26 DIAGNOSIS — J9622 Acute and chronic respiratory failure with hypercapnia: Secondary | ICD-10-CM | POA: Diagnosis not present

## 2019-05-26 DIAGNOSIS — C787 Secondary malignant neoplasm of liver and intrahepatic bile duct: Secondary | ICD-10-CM | POA: Diagnosis present

## 2019-05-26 DIAGNOSIS — Z66 Do not resuscitate: Secondary | ICD-10-CM | POA: Diagnosis present

## 2019-05-26 DIAGNOSIS — Z6832 Body mass index (BMI) 32.0-32.9, adult: Secondary | ICD-10-CM

## 2019-05-26 DIAGNOSIS — A4151 Sepsis due to Escherichia coli [E. coli]: Secondary | ICD-10-CM | POA: Diagnosis not present

## 2019-05-26 DIAGNOSIS — F419 Anxiety disorder, unspecified: Secondary | ICD-10-CM | POA: Diagnosis present

## 2019-05-26 DIAGNOSIS — C7802 Secondary malignant neoplasm of left lung: Secondary | ICD-10-CM | POA: Diagnosis present

## 2019-05-26 DIAGNOSIS — J9601 Acute respiratory failure with hypoxia: Secondary | ICD-10-CM | POA: Diagnosis not present

## 2019-05-26 DIAGNOSIS — K219 Gastro-esophageal reflux disease without esophagitis: Secondary | ICD-10-CM | POA: Diagnosis present

## 2019-05-26 DIAGNOSIS — R778 Other specified abnormalities of plasma proteins: Secondary | ICD-10-CM | POA: Diagnosis present

## 2019-05-26 DIAGNOSIS — Z20828 Contact with and (suspected) exposure to other viral communicable diseases: Secondary | ICD-10-CM | POA: Diagnosis present

## 2019-05-26 DIAGNOSIS — C7801 Secondary malignant neoplasm of right lung: Secondary | ICD-10-CM | POA: Diagnosis present

## 2019-05-26 DIAGNOSIS — Z79899 Other long term (current) drug therapy: Secondary | ICD-10-CM

## 2019-05-26 DIAGNOSIS — J9621 Acute and chronic respiratory failure with hypoxia: Secondary | ICD-10-CM | POA: Diagnosis not present

## 2019-05-26 DIAGNOSIS — J9811 Atelectasis: Secondary | ICD-10-CM | POA: Diagnosis not present

## 2019-05-26 DIAGNOSIS — I5032 Chronic diastolic (congestive) heart failure: Secondary | ICD-10-CM | POA: Diagnosis present

## 2019-05-26 DIAGNOSIS — I10 Essential (primary) hypertension: Secondary | ICD-10-CM | POA: Diagnosis present

## 2019-05-26 DIAGNOSIS — D539 Nutritional anemia, unspecified: Secondary | ICD-10-CM | POA: Diagnosis present

## 2019-05-26 DIAGNOSIS — Z87891 Personal history of nicotine dependence: Secondary | ICD-10-CM

## 2019-05-26 DIAGNOSIS — Z9981 Dependence on supplemental oxygen: Secondary | ICD-10-CM

## 2019-05-26 DIAGNOSIS — C494 Malignant neoplasm of connective and soft tissue of abdomen: Secondary | ICD-10-CM | POA: Diagnosis present

## 2019-05-26 DIAGNOSIS — J441 Chronic obstructive pulmonary disease with (acute) exacerbation: Secondary | ICD-10-CM | POA: Diagnosis present

## 2019-05-26 DIAGNOSIS — R000 Tachycardia, unspecified: Secondary | ICD-10-CM | POA: Diagnosis not present

## 2019-05-26 DIAGNOSIS — E7800 Pure hypercholesterolemia, unspecified: Secondary | ICD-10-CM | POA: Diagnosis present

## 2019-05-26 DIAGNOSIS — Z923 Personal history of irradiation: Secondary | ICD-10-CM

## 2019-05-26 DIAGNOSIS — I2510 Atherosclerotic heart disease of native coronary artery without angina pectoris: Secondary | ICD-10-CM | POA: Diagnosis present

## 2019-05-26 DIAGNOSIS — E669 Obesity, unspecified: Secondary | ICD-10-CM | POA: Diagnosis present

## 2019-05-26 DIAGNOSIS — I248 Other forms of acute ischemic heart disease: Secondary | ICD-10-CM | POA: Diagnosis present

## 2019-05-26 DIAGNOSIS — R0602 Shortness of breath: Secondary | ICD-10-CM | POA: Diagnosis not present

## 2019-05-26 DIAGNOSIS — R531 Weakness: Secondary | ICD-10-CM | POA: Diagnosis not present

## 2019-05-26 MED ORDER — VANCOMYCIN HCL 10 G IV SOLR
2000.0000 mg | Freq: Once | INTRAVENOUS | Status: AC
  Administered 2019-05-27: 2000 mg via INTRAVENOUS
  Filled 2019-05-26: qty 2000

## 2019-05-26 MED ORDER — SODIUM CHLORIDE 0.9 % IV BOLUS
1000.0000 mL | Freq: Once | INTRAVENOUS | Status: AC
  Administered 2019-05-26: 1000 mL via INTRAVENOUS

## 2019-05-26 MED ORDER — METRONIDAZOLE IN NACL 5-0.79 MG/ML-% IV SOLN
500.0000 mg | Freq: Once | INTRAVENOUS | Status: AC
  Administered 2019-05-27: 500 mg via INTRAVENOUS
  Filled 2019-05-26: qty 100

## 2019-05-26 MED ORDER — MAGNESIUM SULFATE 2 GM/50ML IV SOLN
2.0000 g | Freq: Once | INTRAVENOUS | Status: AC
  Administered 2019-05-26: 2 g via INTRAVENOUS
  Filled 2019-05-26: qty 50

## 2019-05-26 MED ORDER — SODIUM CHLORIDE 0.9 % IV SOLN
2.0000 g | Freq: Once | INTRAVENOUS | Status: AC
  Administered 2019-05-26: 2 g via INTRAVENOUS
  Filled 2019-05-26: qty 2

## 2019-05-26 MED ORDER — VANCOMYCIN HCL IN DEXTROSE 1-5 GM/200ML-% IV SOLN
1000.0000 mg | Freq: Once | INTRAVENOUS | Status: DC
  Filled 2019-05-26: qty 200

## 2019-05-26 MED ORDER — METHYLPREDNISOLONE SODIUM SUCC 125 MG IJ SOLR
125.0000 mg | Freq: Once | INTRAMUSCULAR | Status: AC
  Administered 2019-05-26: 125 mg via INTRAVENOUS
  Filled 2019-05-26: qty 2

## 2019-05-26 NOTE — ED Provider Notes (Signed)
[facility] DEPT Provider Note   CSN: [id number] Arrival date & time: 05/26/19  2256     History   Chief Complaint Chief Complaint  Patient presents with   Shortness of Breath   Fever   Weakness    HPI [name] is a 73 y.o. male.     Level 5 caveat for acuity of condition.  Patient here by EMS with acute onset of shortness of breath, weakness and fever since approximately 3 PM this afternoon.  EMS reports fever to 102 patient arrives hypoxic on nonrebreather.  Does have a history of COPD on home oxygen and CPAP at night.  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  He denies any chest pain.  He had a nonproductive cough.  Fever to 102 at home.  He denies any runny nose, sore throat, nausea, vomiting or diarrhea.  Denies any leg pain or leg swelling. Does have a history of COPD as well as CHF.  He is requesting BiPAP on arrival.  Patient states he does not want to be intubated.  He does have a DNR in place.  The history is provided by the patient and the EMS personnel. The history is limited by the condition of the patient.  Shortness of Breath Associated symptoms: fever   Associated symptoms: no abdominal pain, no chest pain, no rash and no vomiting   Fever Associated symptoms: myalgias   Associated symptoms: no chest pain, no congestion, no dysuria, no nausea, no rash and no vomiting   Weakness Associated symptoms: arthralgias, fever, myalgias and shortness of breath   Associated symptoms: no abdominal pain, no chest pain, no dysuria, no nausea and no vomiting     Past Medical History:  Diagnosis Date   Anxiety    Bladder cancer ([place]) 2011   Borderline diabetes mellitus    CAD (coronary artery disease)    Cigarette smoker    COPD (chronic obstructive pulmonary disease) (HCC)    DJD (degenerative joint disease)    Emphysema of lung ([place])    Epistaxis    History of colonic polyps 2004     hyperplastic    History of sinusitis    Hypercholesteremia    Hypertension    Obesity     Patient Active Problem List   Diagnosis Date Noted   Symptomatic anemia 05/06/2019   Anemia of chronic disease    OSA (obstructive sleep apnea)    SBO (small bowel obstruction) (HCC) 03/27/2019   Nausea vomiting and diarrhea    Weakness    SOB (shortness of breath)    Elevated troponin    Palliative care by specialist    Goals of care, counseling/discussion    Abdominal pain    GI bleed 02/18/2019   DNR (do not resuscitate) 12/21/2018   Anemia 12/14/2018   Liver metastasis ([place]) 07/13/2018   Acute blood loss anemia 06/07/2018   Rectal bleeding 05/13/2018   Chronic diastolic CHF (congestive heart failure) ([place]) 05/13/2018   Lower GI bleed 05/13/2018   Class 2 obesity with body mass index (BMI) of 37.0 to 37.9 in adult 01/10/2018   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   Acute on chronic diastolic CHF (congestive heart failure) ([place]) 01/19/2017   Chronic respiratory failure with hypoxia and hypercapnia (HCC) 10/13/2016   OSA treated with BiPAP 10/13/2016   Macrocytic anemia 09/24/2016   COPD with acute exacerbation ([place]) 09/24/2016   Acute bronchitis 02/03/2016   Ex-smoker 10/31/2015  Dilated cardiomyopathy ([place])    Pulmonary hypertension ([place])    Acute on chronic respiratory failure with hypoxia and hypercapnia (HCC) 10/20/2015   Obstruction to urinary outflow 03/24/2011   LBP (low back pain) 03/24/2011   Malignant neoplasm of bladder ([place]) 09/22/2010   HEMATURIA UNSPECIFIED 05/14/2010   Osteoarthritis 03/28/2010   Coronary atherosclerosis of native coronary artery 12/19/2007   Acute sinusitis 12/19/2007   End stage COPD ([place]) 12/19/2007   COLONIC POLYPS 12/18/2007   HYPERCHOLESTEROLEMIA 12/18/2007   Anxiety 12/18/2007   Essential hypertension 12/18/2007   GASTRITIS 12/18/2007   Impaired fasting glucose 12/18/2007     Past Surgical History:  Procedure Laterality Date   cataract surgery  septemeber 2013   cataract surgery/sub posterior vitrectomy in left eye  1996   Dr. Zadie Rhine   PT[facility]  [phone]        Home Medications    Prior to Admission medications   Medication Sig Start Date End Date Taking? Authorizing Provider  Albuterol Sulfate (PROAIR RESPICLICK) 629 (90 Base) MCG/ACT AEPB Inhale 2 puffs into the lungs every 6 (six) hours as needed (shortness of breath). 10/26/18   Noralee Space, MD  ALPRAZolam Duanne Moron) 0.5 MG tablet Take 1 tablet (0.5 mg total) by mouth at bedtime. 10/26/18   Noralee Space, MD  Ascorbic Acid (VITAMIN C) 1000 MG tablet Take 1,000 mg by mouth daily.    [provider]  budesonide (PULMICORT) 0.25 MG/2ML nebulizer solution Take 2 mLs (0.25 mg total) by nebulization 2 (two) times daily. 02/12/19   Garner Nash, DO  cholecalciferol (VITAMIN D) 400 units TABS tablet Take 400 Units by mouth at bedtime.    [provider]  Flaxseed, Linseed, (FLAX SEEDS PO) Take 1 capsule by mouth daily.    [provider]  fluticasone (FLONASE) 50 MCG/ACT nasal spray Place 2 sprays into both nostrils daily as needed for allergies or rhinitis.  09/21/18   [provider]  furosemide (LASIX) 40 MG tablet Take 40 mg by mouth daily as needed for fluid.     [provider]  guaiFENesin (MUCINEX) 600 MG 12 hr tablet Take 1,200 mg by mouth 2 (two) times daily.    [provider]  ipratropium-albuterol (DUONEB) 0.5-2.5 (3) MG/3ML SOLN Take 3 mLs by nebulization every 4 (four) hours as needed. Patient taking differently: Take 3 mLs by nebulization See admin instructions. Take every 4 hours. Take additional doses every 4 hours as needed for shortness of breath. 02/12/19   Icard, Octavio Graves, DO  methocarbamol (ROBAXIN) 500 MG tablet Take 1,000 mg by mouth at bedtime.  03/07/18   [provider]  multivitamin-lutein (OCUVITE-LUTEIN) CAPS  capsule Take 1 capsule by mouth 2 (two) times a day.     [provider]  nitroGLYCERIN (NITROSTAT) 0.4 MG SL tablet Place 1 tablet (0.4 mg total) under the tongue every 5 (five) minutes as needed for chest pain. 11/19/15   Richardson Dopp T, PA-C  ondansetron (ZOFRAN-ODT) 8 MG disintegrating tablet Take 8 mg by mouth every 8 (eight) hours as needed for nausea/vomiting. 04/02/19   [provider]  pantoprazole (PROTONIX) 40 MG tablet Take 40 mg by mouth daily. 03/03/19   [provider]  Polyethyl Glycol-Propyl Glycol (SYSTANE OP) Apply 1 drop to eye every 6 (six) hours as needed (dry eyes).     [provider]  simvastatin (ZOCOR) 20 MG tablet Take 1 tablet (20 mg total) by mouth daily at 6 PM. 11/19/15   Kathlen Mody,  Scott T, PA-C  sodium chloride (OCEAN) 0.65 % SOLN nasal spray Place 1 spray into both nostrils as needed for congestion. Patient taking differently: Place 1 spray into both nostrils daily as needed for congestion.  10/29/15   Cherene Altes, MD  traMADol (ULTRAM) 50 MG tablet Take 1 tablet (50 mg total) by mouth every 6 (six) hours as needed for moderate pain. 03/30/19   Eugenie Filler, MD  vitamin B-12 (CYANOCOBALAMIN) 1000 MCG tablet Take 1,000 mcg by mouth daily.    [provider]    Family History Family History  Problem Relation Age of Onset   Heart disease Mother    Heart disease Brother    Cancer Brother    Aneurysm Brother     Social History Social History   Tobacco Use   Smoking status: Former Smoker    Packs/day: 2.00    Years: 45.00    Pack years: 90.00    Types: Cigarettes    Quit date: 10/17/2015    Years since quitting: 3.6   Smokeless tobacco: Never Used  Substance Use Topics   Alcohol use: Yes    Alcohol/week: 0.0 standard drinks    Comment: glass of wine at night   Drug use: No     Allergies   Amlodipine   Review of Systems Review of Systems  Constitutional: Positive for activity change,  appetite change and fever.  HENT: Negative for congestion.   Respiratory: Positive for shortness of breath.   Cardiovascular: Negative for chest pain.  Gastrointestinal: Negative for abdominal pain, nausea and vomiting.  Genitourinary: Negative for dysuria and hematuria.  Musculoskeletal: Positive for arthralgias and myalgias.  Skin: Negative for rash.  Neurological: Positive for weakness.    all other systems are negative except as noted in the HPI and PMH.    Physical Exam Updated Vital Signs BP (!) 97/56 (BP Location: Right Arm)    Pulse (!) 109    Temp 100.2 F (37.9 C) (Oral)    Resp (!) 26    Ht 5\' 6"  (1.676 m)    Wt 90.7 kg    SpO2 98%    BMI 32.28 kg/m   Physical Exam Vitals signs and nursing note reviewed.  Constitutional:      General: He is in acute distress.     Appearance: He is well-developed. He is obese. He is ill-appearing.     Comments: Moderate respiratory distress, tachypneic, increased work of breathing  HENT:     Head: Normocephalic and atraumatic.     Mouth/Throat:     Pharynx: No oropharyngeal exudate.  Eyes:     Conjunctiva/sclera: Conjunctivae normal.     Pupils: Pupils are equal, round, and reactive to light.     Comments: Pale conjunctiva  Neck:     Musculoskeletal: Normal range of motion and neck supple.     Comments: No meningismus. Cardiovascular:     Rate and Rhythm: Regular rhythm. Tachycardia present.     Heart sounds: Normal heart sounds. No murmur.  Pulmonary:     Effort: Respiratory distress present.     Breath sounds: Wheezing present.     Comments: Decreased breath sounds with diffuse expiratory wheezing Abdominal:     Palpations: Abdomen is soft. There is mass.     Tenderness: There is no abdominal tenderness. There is no guarding or rebound.  Musculoskeletal: Normal range of motion.        General: No tenderness.     Right lower leg: Edema  present.     Left lower leg: Edema present.  Skin:    General: Skin is warm.      Capillary Refill: Capillary refill takes less than 2 seconds.  Neurological:     General: No focal deficit present.     Mental Status: He is alert and oriented to person, place, and time. Mental status is at baseline.     Cranial Nerves: No cranial nerve deficit.     Motor: No abnormal muscle tone.     Coordination: Coordination normal.     Comments: No ataxia on finger to nose bilaterally. No pronator drift. 5/5 strength throughout. CN 2-12 intact.Equal grip strength. Sensation intact.   Psychiatric:        Behavior: Behavior normal.      ED Treatments / Results  Labs (all labs ordered are listed, but only abnormal results are displayed) Labs Reviewed  COMPREHENSIVE METABOLIC PANEL - Abnormal; Notable for the following components:      Result Value   Glucose, Bld 148 (*)    Calcium 8.3 (*)    Albumin 3.3 (*)    AST 59 (*)    ALT 47 (*)    Total Bilirubin 4.4 (*)    All other components within normal limits  LACTIC ACID, PLASMA - Abnormal; Notable for the following components:   Lactic Acid, Venous 2.8 (*)    All other components within normal limits  LACTIC ACID, PLASMA - Abnormal; Notable for the following components:   Lactic Acid, Venous 2.6 (*)    All other components within normal limits  CBC WITH DIFFERENTIAL/PLATELET - Abnormal; Notable for the following components:   WBC 10.7 (*)    RBC 2.53 (*)    Hemoglobin 7.6 (*)    HCT 25.3 (*)    RDW 19.7 (*)    nRBC 1.0 (*)    Neutro Abs 9.7 (*)    Lymphs Abs 0.4 (*)    Abs Immature Granulocytes 0.16 (*)    All other components within normal limits  PROTIME-INR - Abnormal; Notable for the following components:   Prothrombin Time 15.4 (*)    All other components within normal limits  URINALYSIS, ROUTINE W REFLEX MICROSCOPIC - Abnormal; Notable for the following components:   Color, Urine AMBER (*)    Specific Gravity, Urine 1.032 (*)    Bilirubin Urine SMALL (*)    Protein, ur 30 (*)    Bacteria, UA RARE (*)    All  other components within normal limits  BLOOD GAS, ARTERIAL - Abnormal; Notable for the following components:   pO2, Arterial 157 (*)    All other components within normal limits  BRAIN NATRIURETIC PEPTIDE - Abnormal; Notable for the following components:   B Natriuretic Peptide 186.8 (*)    All other components within normal limits  D-DIMER, QUANTITATIVE (NOT AT [facility]) - Abnormal; Notable for the following components:   D-Dimer, Quant 3.31 (*)    All other components within normal limits  LACTATE DEHYDROGENASE - Abnormal; Notable for the following components:   LDH 223 (*)    All other components within normal limits  FERRITIN - Abnormal; Notable for the following components:   Ferritin 868 (*)    All other components within normal limits  FIBRINOGEN - Abnormal; Notable for the following components:   Fibrinogen 543 (*)    All other components within normal limits  C-REACTIVE PROTEIN - Abnormal; Notable for the following components:   CRP 4.0 (*)    All  other components within normal limits  IRON AND TIBC - Abnormal; Notable for the following components:   Iron 20 (*)    TIBC 220 (*)    Saturation Ratios 9 (*)    All other components within normal limits  RETICULOCYTES - Abnormal; Notable for the following components:   Retic Ct Pct 5.7 (*)    RBC. 2.22 (*)    Immature Retic Fract 36.4 (*)    All other components within normal limits  TROPONIN I (HIGH SENSITIVITY) - Abnormal; Notable for the following components:   Troponin I (High Sensitivity) 20.0 (*)    All other components within normal limits  SARS CORONAVIRUS 2 (HOSPITAL ORDER, [facility])  CULTURE, BLOOD (ROUTINE X 2)  CULTURE, BLOOD (ROUTINE X 2)  URINE CULTURE  RESPIRATORY PANEL BY PCR  EXPECTORATED SPUTUM ASSESSMENT W REFEX TO RESP CULTURE  MRSA PCR SCREENING  APTT  LIPASE, BLOOD  PROCALCITONIN  TRIGLYCERIDES  VITAMIN B12  FOLATE  INFLUENZA PANEL BY PCR (TYPE A & B)  HIV ANTIBODY  (ROUTINE TESTING W REFLEX)  LACTIC ACID, PLASMA  LACTIC ACID, PLASMA  LEGIONELLA PNEUMOPHILA SEROGP 1 UR AG  STREP PNEUMONIAE URINARY ANTIGEN  HEMOGLOBIN A1C  LIPID PANEL  TYPE AND SCREEN  PREPARE RBC (CROSSMATCH)  TROPONIN I (HIGH SENSITIVITY)  TROPONIN I (HIGH SENSITIVITY)  TROPONIN I (HIGH SENSITIVITY)  TROPONIN I (HIGH SENSITIVITY)    EKG EKG Interpretation  Date/Time:  Saturday May 26 2019 23:11:21 EDT Ventricular Rate:  116 PR Interval:    QRS Duration: 86 QT Interval:  323 QTC Calculation: 449 R Axis:   55 Text Interpretation:  Sinus tachycardia with irregular rate Borderline low voltage, extremity leads Baseline wander in lead(s) V1 No significant change was found Confirmed by Ezequiel Essex [phone]) on 05/26/2019 11:53:08 PM   Radiology Ct Angio Chest Pe W And/or Wo Contrast  Result Date: 05/27/2019 CLINICAL DATA:  Shortness of breath, weakness, fever. 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 EXAM: CT ANGIOGRAPHY CHEST CT ABDOMEN AND PELVIS WITH CONTRAST TECHNIQUE: Multidetector CT imaging of the chest was performed using the standard protocol during bolus administration of intravenous contrast. Multiplanar CT image reconstructions and MIPs were obtained to evaluate the vascular anatomy. Multidetector CT imaging of the abdomen and pelvis was performed using the standard protocol during bolus administration of intravenous contrast. CONTRAST:  185mL OMNIPAQUE IOHEXOL 350 MG/ML SOLN COMPARISON:  03/27/2019. FINDINGS: CTA CHEST FINDINGS Cardiovascular: Motion degraded images. Within that constraint, there is satisfactory opacification of the bilateral pulmonary arteries to the lobar level. No evidence of pulmonary embolism. No evidence of thoracic aortic aneurysm. Atherosclerotic calcifications of the aortic arch. Heart is normal in size.  No pericardial effusion. Three vessel coronary atherosclerosis. Mediastinum/Nodes: No suspicious  mediastinal lymphadenopathy. Visualized thyroid is unremarkable. Lungs/Pleura: Bilateral pulmonary nodules/metastases, motion degraded but favored to be mildly progressive, including: --1.9 cm right upper lobe nodule (series 12/image 53), previously 1.5 cm --2.4 cm superior segment left lower lobe nodule (series 12/image 53), previously 1.8 cm --3.5 cm central right lower lobe mass (series 12/image 109), previously 2.6 cm Extensive centrilobular and paraseptal emphysematous changes, upper lung predominant. No focal consolidation. No pleural effusion or pneumothorax. Musculoskeletal: Degenerative changes of the thoracic spine. Review of the MIP images confirms the above findings. CT ABDOMEN and PELVIS FINDINGS Motion degraded images. Hepatobiliary: Embolization coils within a branch of the distal left hepatic artery. Partially calcified treated left hepatic lesion in segment 2 measures 5.2 x 3.7  cm, previously 4.8 x 3.7 cm, grossly unchanged. 3.5 x 2.9 cm hypodense lesion in segment 4A (series 4/image 11), previously 3.8 x 2.8 cm, unchanged. Additional 14 mm lesion inferiorly in the right hepatic lobe (series 4/image 27), similar to the prior. Gallbladder is unremarkable. No intrahepatic or extrahepatic ductal dilatation. Pancreas: Within normal limits. Spleen: Within normal limits. Adrenals/Urinary Tract: Adrenal glands are within normal limits. 6.8 cm right upper pole renal cyst. Left kidney is within normal limits. No hydronephrosis. Bladder is underdistended but unremarkable. Stomach/Bowel: Stomach is within normal limits. No evidence of bowel obstruction. Normal appendix (series 4/image 46). Left colon is decompressed. Mild sigmoid diverticulosis, without evidence of diverticulitis. Vascular/Lymphatic: No evidence of abdominal aortic aneurysm. Atherosclerotic calcifications of the abdominal aorta and branch vessels. No suspicious abdominopelvic lymphadenopathy. Reproductive: Prostate is unremarkable. Other: 16.7  x 26.0 x 18.7 cm mass in the right mid abdomen, previously 16.0 x 24.0 x 18.5 cm, mildly increased (calculated volume = 4250 mL versus 3720 mL on the prior). Interval development of necrosis with gas along the left superior aspect of the mass (series 4/image 45). No abdominopelvic ascites. Musculoskeletal: Degenerative changes of the visualized thoracolumbar spine, most prominent at L5-S1. Review of the MIP images confirms the above findings. IMPRESSION: No evidence of pulmonary embolism. 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 Status post chemoembolization. Hepatic metastases are grossly unchanged. Progressive pulmonary metastases, as described above, although poorly evaluated due to motion degradation. Aortic Atherosclerosis (ICD10-I70.0) and Emphysema (ICD10-J43.9). Electronically Signed   By: Julian Hy M.D.   On: 05/27/2019 03:48   Ct Abdomen Pelvis W Contrast  Result Date: 05/27/2019 CLINICAL DATA:  Shortness of breath, weakness, fever. 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 EXAM: CT ANGIOGRAPHY CHEST CT ABDOMEN AND PELVIS WITH CONTRAST TECHNIQUE: Multidetector CT imaging of the chest was performed using the standard protocol during bolus administration of intravenous contrast. Multiplanar CT image reconstructions and MIPs were obtained to evaluate the vascular anatomy. Multidetector CT imaging of the abdomen and pelvis was performed using the standard protocol during bolus administration of intravenous contrast. CONTRAST:  12mL OMNIPAQUE IOHEXOL 350 MG/ML SOLN COMPARISON:  03/27/2019. FINDINGS: CTA CHEST FINDINGS Cardiovascular: Motion degraded images. Within that constraint, there is satisfactory opacification of the bilateral pulmonary arteries to the lobar level. No evidence of pulmonary embolism. No evidence of thoracic aortic aneurysm. Atherosclerotic calcifications of the aortic arch.  Heart is normal in size.  No pericardial effusion. Three vessel coronary atherosclerosis. Mediastinum/Nodes: No suspicious mediastinal lymphadenopathy. Visualized thyroid is unremarkable. Lungs/Pleura: Bilateral pulmonary nodules/metastases, motion degraded but favored to be mildly progressive, including: --1.9 cm right upper lobe nodule (series 12/image 53), previously 1.5 cm --2.4 cm superior segment left lower lobe nodule (series 12/image 53), previously 1.8 cm --3.5 cm central right lower lobe mass (series 12/image 109), previously 2.6 cm Extensive centrilobular and paraseptal emphysematous changes, upper lung predominant. No focal consolidation. No pleural effusion or pneumothorax. Musculoskeletal: Degenerative changes of the thoracic spine. Review of the MIP images confirms the above findings. CT ABDOMEN and PELVIS FINDINGS Motion degraded images. Hepatobiliary: Embolization coils within a branch of the distal left hepatic artery. Partially calcified treated left hepatic lesion in segment 2 measures 5.2 x 3.7 cm, previously 4.8 x 3.7 cm, grossly unchanged. 3.5 x 2.9 cm hypodense lesion in segment 4A (series 4/image 11), previously 3.8 x 2.8 cm, unchanged. Additional 14 mm lesion inferiorly in the right hepatic lobe (series 4/image 27),  similar to the prior. Gallbladder is unremarkable. No intrahepatic or extrahepatic ductal dilatation. Pancreas: Within normal limits. Spleen: Within normal limits. Adrenals/Urinary Tract: Adrenal glands are within normal limits. 6.8 cm right upper pole renal cyst. Left kidney is within normal limits. No hydronephrosis. Bladder is underdistended but unremarkable. Stomach/Bowel: Stomach is within normal limits. No evidence of bowel obstruction. Normal appendix (series 4/image 46). Left colon is decompressed. Mild sigmoid diverticulosis, without evidence of diverticulitis. Vascular/Lymphatic: No evidence of abdominal aortic aneurysm. Atherosclerotic calcifications of the abdominal  aorta and branch vessels. No suspicious abdominopelvic lymphadenopathy. Reproductive: Prostate is unremarkable. Other: 16.7 x 26.0 x 18.7 cm mass in the right mid abdomen, previously 16.0 x 24.0 x 18.5 cm, mildly increased (calculated volume = 4250 mL versus 3720 mL on the prior). Interval development of necrosis with gas along the left superior aspect of the mass (series 4/image 45). No abdominopelvic ascites. Musculoskeletal: Degenerative changes of the visualized thoracolumbar spine, most prominent at L5-S1. Review of the MIP images confirms the above findings. IMPRESSION: No evidence of pulmonary embolism. 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 Status post chemoembolization. Hepatic metastases are grossly unchanged. Progressive pulmonary metastases, as described above, although poorly evaluated due to motion degradation. Aortic Atherosclerosis (ICD10-I70.0) and Emphysema (ICD10-J43.9). Electronically Signed   By: Julian Hy M.D.   On: 05/27/2019 03:48   Dg Chest Port 1 View  Result Date: 05/26/2019 CLINICAL DATA:  Shortness of breath and fever for several hours EXAM: PORTABLE CHEST 1 VIEW COMPARISON:  05/06/2019 FINDINGS: Cardiac shadow is stable. Aortic calcifications are seen. The lungs are well aerated bilaterally with emphysematous changes and crowding of the markings in the bases. Multiple bilateral pulmonary nodules are seen. Slight increased density in the bases is noted when compare with the prior exam likely representing some superimposed atelectatic change. IMPRESSION: Multifocal pulmonary nodules consistent with metastatic disease. Mild increased atelectasis in the bases superimposed over changes of COPD. Electronically Signed   By: Inez Catalina M.D.   On: 05/26/2019 23:46    Procedures Procedures (including critical care time)  Medications Ordered in ED Medications  ceFEPIme (MAXIPIME) 2 g in sodium chloride 0.9 % 100  mL IVPB (has no administration in time range)  metroNIDAZOLE (FLAGYL) IVPB 500 mg (has no administration in time range)  vancomycin (VANCOCIN) IVPB 1000 mg/200 mL premix (has no administration in time range)  sodium chloride 0.9 % bolus 1,000 mL (has no administration in time range)  methylPREDNISolone sodium succinate (SOLU-MEDROL) 125 mg/2 mL injection 125 mg (has no administration in time range)  magnesium sulfate IVPB 2 g 50 mL (has no administration in time range)     Initial Impression / Assessment and Plan / ED Course  I have reviewed the triage vital signs and the nursing notes.  Pertinent labs & imaging results that were available during my care of the patient were reviewed by me and considered in my medical decision making (see chart for details).       Patient with respiratory distress, hypoxia, fever, shortness of breath.  Known history of bladder carcinoma requiring blood transfusions.  No current chemotherapy.  Patient hypoxic on nonrebreather on arrival but this was corrected with O2 being reconnected to the source.  He denies chest pain.  Code sepsis activated.  Patient given gentle IV fluids, IV antibiotics after cultures obtained.  Will check chest x-ray coronavirus swab.  Chest x-ray shows metastatic disease without obvious infiltrate.  Imaging is reassuring  without significant CO2 retention.  Hemoglobin 7.6 which is down from 8.5 after patient's last blood transfusion.  Does have elevated d-dimer as well as new hyperbilirubinemia.  Coronavirus testing is negative.  He will need admission for hypoxic and hypercarbic respiratory failure and possible COPD exacerbation as well as symptomatic anemia.  Will order 1 unit of blood transfusion.  We will also obtain CT to rule out pulmonary embolism and CT abdomen given his increasing bilirubin.  Unclear source of sepsis, but likely respiratory from COPD exacerbation. ABG reassuring on Bipap without CO2 retention.  CT  negative for PE. Does show diffuse metastatic disease.  BP has improved. Respiratory status and mental status stable on bipap.  Patient confirms DNR. Admission d/w Dr. Blaine Hamper.  [name] was evaluated in Emergency Department on 05/27/2019 for the symptoms described in the history of present illness. He was evaluated in the context of the global COVID-19 pandemic, which necessitated consideration that the patient might be at risk for infection with the SARS-CoV-2 virus that causes COVID-19. Institutional protocols and algorithms that pertain to the evaluation of patients at risk for COVID-19 are in a state of rapid change based on information released by regulatory bodies including the CDC and federal and state organizations. These policies and algorithms were followed during the patient's care in the ED.  CRITICAL CARE Performed by: Ezequiel Essex Total critical care time: 60 minutes Critical care time was exclusive of separately billable procedures and treating other patients. Critical care was necessary to treat or prevent imminent or life-threatening deterioration. Critical care was time spent personally by me on the following activities: development of treatment plan with patient and/or surrogate as well as nursing, discussions with consultants, evaluation of patient's response to treatment, examination of patient, obtaining history from patient or surrogate, ordering and performing treatments and interventions, ordering and review of laboratory studies, ordering and review of radiographic studies, pulse oximetry and re-evaluation of patient's condition.     Final Clinical Impressions(s) / ED Diagnoses   Final diagnoses:  Sepsis with acute hypoxic respiratory failure without septic shock, due to unspecified organism [facility])    ED Discharge Orders    None       Ezequiel Essex, MD 05/27/19 [phone]

## 2019-05-26 NOTE — Progress Notes (Signed)
A consult was received from an ED physician for cefepime and vancomycin per pharmacy dosing.  The patient's profile has been reviewed for ht/wt/allergies/indication/available labs.   A one time order has been placed for Cefepime 2 Gm and Vancomycin 2 Gm.  Further antibiotics/pharmacy consults should be ordered by admitting physician if indicated.                       Thank you, Dorrene German 05/26/2019  11:40 PM

## 2019-05-26 NOTE — ED Triage Notes (Signed)
Arrives via EMS from home. C/C SOB, weakness and fever since 3 pm this afternoon. Reports fever of 102, took tylenol and brought fever down to 100.2. Arrives via non-breather, O2 100%.

## 2019-05-27 ENCOUNTER — Encounter (HOSPITAL_COMMUNITY): Payer: Self-pay

## 2019-05-27 ENCOUNTER — Other Ambulatory Visit: Payer: Self-pay

## 2019-05-27 ENCOUNTER — Inpatient Hospital Stay (HOSPITAL_COMMUNITY): Payer: Medicare Other

## 2019-05-27 DIAGNOSIS — C787 Secondary malignant neoplasm of liver and intrahepatic bile duct: Secondary | ICD-10-CM | POA: Diagnosis present

## 2019-05-27 DIAGNOSIS — J441 Chronic obstructive pulmonary disease with (acute) exacerbation: Secondary | ICD-10-CM

## 2019-05-27 DIAGNOSIS — J9622 Acute and chronic respiratory failure with hypercapnia: Secondary | ICD-10-CM | POA: Diagnosis present

## 2019-05-27 DIAGNOSIS — R609 Edema, unspecified: Secondary | ICD-10-CM

## 2019-05-27 DIAGNOSIS — I5032 Chronic diastolic (congestive) heart failure: Secondary | ICD-10-CM | POA: Diagnosis not present

## 2019-05-27 DIAGNOSIS — N281 Cyst of kidney, acquired: Secondary | ICD-10-CM | POA: Diagnosis not present

## 2019-05-27 DIAGNOSIS — R0602 Shortness of breath: Secondary | ICD-10-CM | POA: Diagnosis not present

## 2019-05-27 DIAGNOSIS — E7800 Pure hypercholesterolemia, unspecified: Secondary | ICD-10-CM | POA: Diagnosis present

## 2019-05-27 DIAGNOSIS — R6520 Severe sepsis without septic shock: Secondary | ICD-10-CM | POA: Diagnosis not present

## 2019-05-27 DIAGNOSIS — Z515 Encounter for palliative care: Secondary | ICD-10-CM

## 2019-05-27 DIAGNOSIS — K7689 Other specified diseases of liver: Secondary | ICD-10-CM | POA: Diagnosis not present

## 2019-05-27 DIAGNOSIS — J9621 Acute and chronic respiratory failure with hypoxia: Secondary | ICD-10-CM | POA: Diagnosis present

## 2019-05-27 DIAGNOSIS — Z66 Do not resuscitate: Secondary | ICD-10-CM | POA: Diagnosis present

## 2019-05-27 DIAGNOSIS — D539 Nutritional anemia, unspecified: Secondary | ICD-10-CM

## 2019-05-27 DIAGNOSIS — K219 Gastro-esophageal reflux disease without esophagitis: Secondary | ICD-10-CM | POA: Diagnosis present

## 2019-05-27 DIAGNOSIS — C679 Malignant neoplasm of bladder, unspecified: Secondary | ICD-10-CM | POA: Diagnosis not present

## 2019-05-27 DIAGNOSIS — Z20828 Contact with and (suspected) exposure to other viral communicable diseases: Secondary | ICD-10-CM | POA: Diagnosis present

## 2019-05-27 DIAGNOSIS — Z923 Personal history of irradiation: Secondary | ICD-10-CM | POA: Diagnosis not present

## 2019-05-27 DIAGNOSIS — Z87891 Personal history of nicotine dependence: Secondary | ICD-10-CM | POA: Diagnosis not present

## 2019-05-27 DIAGNOSIS — C494 Malignant neoplasm of connective and soft tissue of abdomen: Secondary | ICD-10-CM | POA: Diagnosis present

## 2019-05-27 DIAGNOSIS — A419 Sepsis, unspecified organism: Secondary | ICD-10-CM | POA: Diagnosis not present

## 2019-05-27 DIAGNOSIS — C7802 Secondary malignant neoplasm of left lung: Secondary | ICD-10-CM | POA: Diagnosis present

## 2019-05-27 DIAGNOSIS — Z9981 Dependence on supplemental oxygen: Secondary | ICD-10-CM | POA: Diagnosis not present

## 2019-05-27 DIAGNOSIS — Z7189 Other specified counseling: Secondary | ICD-10-CM | POA: Diagnosis not present

## 2019-05-27 DIAGNOSIS — I10 Essential (primary) hypertension: Secondary | ICD-10-CM

## 2019-05-27 DIAGNOSIS — I2510 Atherosclerotic heart disease of native coronary artery without angina pectoris: Secondary | ICD-10-CM | POA: Diagnosis present

## 2019-05-27 DIAGNOSIS — I248 Other forms of acute ischemic heart disease: Secondary | ICD-10-CM | POA: Diagnosis present

## 2019-05-27 DIAGNOSIS — K5730 Diverticulosis of large intestine without perforation or abscess without bleeding: Secondary | ICD-10-CM | POA: Diagnosis not present

## 2019-05-27 DIAGNOSIS — R531 Weakness: Secondary | ICD-10-CM | POA: Diagnosis not present

## 2019-05-27 DIAGNOSIS — Z79899 Other long term (current) drug therapy: Secondary | ICD-10-CM | POA: Diagnosis not present

## 2019-05-27 DIAGNOSIS — G4733 Obstructive sleep apnea (adult) (pediatric): Secondary | ICD-10-CM | POA: Diagnosis present

## 2019-05-27 DIAGNOSIS — I110 Hypertensive heart disease with heart failure: Secondary | ICD-10-CM | POA: Diagnosis present

## 2019-05-27 DIAGNOSIS — A4151 Sepsis due to Escherichia coli [E. coli]: Secondary | ICD-10-CM | POA: Diagnosis present

## 2019-05-27 DIAGNOSIS — E785 Hyperlipidemia, unspecified: Secondary | ICD-10-CM | POA: Diagnosis present

## 2019-05-27 DIAGNOSIS — J9601 Acute respiratory failure with hypoxia: Secondary | ICD-10-CM | POA: Diagnosis not present

## 2019-05-27 DIAGNOSIS — R509 Fever, unspecified: Secondary | ICD-10-CM | POA: Diagnosis not present

## 2019-05-27 DIAGNOSIS — C7801 Secondary malignant neoplasm of right lung: Secondary | ICD-10-CM | POA: Diagnosis present

## 2019-05-27 DIAGNOSIS — C7800 Secondary malignant neoplasm of unspecified lung: Secondary | ICD-10-CM | POA: Diagnosis not present

## 2019-05-27 DIAGNOSIS — Z7951 Long term (current) use of inhaled steroids: Secondary | ICD-10-CM | POA: Diagnosis not present

## 2019-05-27 DIAGNOSIS — I5033 Acute on chronic diastolic (congestive) heart failure: Secondary | ICD-10-CM | POA: Diagnosis not present

## 2019-05-27 LAB — LACTIC ACID, PLASMA
Lactic Acid, Venous: 2.8 mmol/L (ref 0.5–1.9)
Lactic Acid, Venous: 2.6 mmol/L (ref 0.5–1.9)
Lactic Acid, Venous: 3.2 mmol/L (ref 0.5–1.9)
Lactic Acid, Venous: 5.4 mmol/L (ref 0.5–1.9)

## 2019-05-27 LAB — TROPONIN I (HIGH SENSITIVITY)
Troponin I (High Sensitivity): 17 ng/L (ref <18)
Troponin I (High Sensitivity): 20 ng/L — ABNORMAL HIGH (ref <18)
Troponin I (High Sensitivity): 22 ng/L — ABNORMAL HIGH (ref <18)
Troponin I (High Sensitivity): 21 ng/L — ABNORMAL HIGH (ref <18)
Troponin I (High Sensitivity): 16 ng/L (ref <18)

## 2019-05-27 LAB — STREP PNEUMONIAE URINARY ANTIGEN: Strep Pneumo Urinary Antigen: NEGATIVE

## 2019-05-27 LAB — BLOOD CULTURE ID PANEL (REFLEXED)

## 2019-05-27 LAB — BLOOD GAS, ARTERIAL
Acid-Base Excess: 0.2 mmol/L (ref 0.0–2.0)
Bicarbonate: 24.4 mmol/L (ref 20.0–28.0)
Drawn by: 11249
FIO2: 15
O2 Saturation: 99.1 %
Patient temperature: 100
pCO2 arterial: 41.6 mmHg (ref 32.0–48.0)
pH, Arterial: 7.391 (ref 7.350–7.450)
pO2, Arterial: 157 mmHg — ABNORMAL HIGH (ref 83.0–108.0)

## 2019-05-27 LAB — C-REACTIVE PROTEIN: CRP: 4 mg/dL — ABNORMAL HIGH (ref <1.0)

## 2019-05-27 LAB — CBC WITH DIFFERENTIAL/PLATELET
Abs Immature Granulocytes: 0.16 10*3/uL — ABNORMAL HIGH (ref 0.00–0.07)
Basophils Absolute: 0 10*3/uL (ref 0.0–0.1)
Basophils Relative: 0 %
Eosinophils Absolute: 0 10*3/uL (ref 0.0–0.5)
Eosinophils Relative: 0 %
HCT: 25.3 % — ABNORMAL LOW (ref 39.0–52.0)
Hemoglobin: 7.6 g/dL — ABNORMAL LOW (ref 13.0–17.0)
Immature Granulocytes: 2 %
Lymphocytes Relative: 3 %
Lymphs Abs: 0.4 10*3/uL — ABNORMAL LOW (ref 0.7–4.0)
MCH: 30 pg (ref 26.0–34.0)
MCHC: 30 g/dL (ref 30.0–36.0)
MCV: 100 fL (ref 80.0–100.0)
Monocytes Absolute: 0.5 10*3/uL (ref 0.1–1.0)
Monocytes Relative: 4 %
Neutro Abs: 9.7 10*3/uL — ABNORMAL HIGH (ref 1.7–7.7)
Neutrophils Relative %: 91 %
Platelets: 199 10*3/uL (ref 150–400)
RBC: 2.53 MIL/uL — ABNORMAL LOW (ref 4.22–5.81)
RDW: 19.7 % — ABNORMAL HIGH (ref 11.5–15.5)
WBC: 10.7 10*3/uL — ABNORMAL HIGH (ref 4.0–10.5)
nRBC: 1 % — ABNORMAL HIGH (ref 0.0–0.2)

## 2019-05-27 LAB — FERRITIN: Ferritin: 868 ng/mL — ABNORMAL HIGH (ref 24–336)

## 2019-05-27 LAB — COMPREHENSIVE METABOLIC PANEL
ALT: 47 U/L — ABNORMAL HIGH (ref 0–44)
AST: 59 U/L — ABNORMAL HIGH (ref 15–41)
Albumin: 3.3 g/dL — ABNORMAL LOW (ref 3.5–5.0)
Alkaline Phosphatase: 119 U/L (ref 38–126)
Anion gap: 11 (ref 5–15)
BUN: 22 mg/dL (ref 8–23)
CO2: 24 mmol/L (ref 22–32)
Calcium: 8.3 mg/dL — ABNORMAL LOW (ref 8.9–10.3)
Chloride: 101 mmol/L (ref 98–111)
Creatinine, Ser: 1 mg/dL (ref 0.61–1.24)
GFR calc Af Amer: >60 mL/min (ref >60)
GFR calc non Af Amer: >60 mL/min (ref >60)
Glucose, Bld: 148 mg/dL — ABNORMAL HIGH (ref 70–99)
Potassium: 3.5 mmol/L (ref 3.5–5.1)
Sodium: 136 mmol/L (ref 135–145)
Total Bilirubin: 4.4 mg/dL — ABNORMAL HIGH (ref 0.3–1.2)
Total Protein: 6.8 g/dL (ref 6.5–8.1)

## 2019-05-27 LAB — SARS CORONAVIRUS 2 BY RT PCR (HOSPITAL ORDER, PERFORMED IN ~~LOC~~ HOSPITAL LAB): SARS Coronavirus 2: NEGATIVE

## 2019-05-27 LAB — URINALYSIS, ROUTINE W REFLEX MICROSCOPIC
Glucose, UA: NEGATIVE mg/dL
Hgb urine dipstick: NEGATIVE
Ketones, ur: NEGATIVE mg/dL
Leukocytes,Ua: NEGATIVE
Nitrite: NEGATIVE
Protein, ur: 30 mg/dL — AB
Specific Gravity, Urine: 1.032 — ABNORMAL HIGH (ref 1.005–1.030)
pH: 5 (ref 5.0–8.0)

## 2019-05-27 LAB — HEMOGLOBIN A1C
Hgb A1c MFr Bld: 5.6 % (ref 4.8–5.6)
Mean Plasma Glucose: 114.02 mg/dL

## 2019-05-27 LAB — IRON AND TIBC
Iron: 20 ug/dL — ABNORMAL LOW (ref 45–182)
Saturation Ratios: 9 % — ABNORMAL LOW (ref 17.9–39.5)
TIBC: 220 ug/dL — ABNORMAL LOW (ref 250–450)
UIBC: 200 ug/dL

## 2019-05-27 LAB — LIPASE, BLOOD: Lipase: 31 U/L (ref 11–51)

## 2019-05-27 LAB — PROCALCITONIN: Procalcitonin: 4.29 ng/mL

## 2019-05-27 LAB — PROTIME-INR
INR: 1.2 (ref 0.8–1.2)
Prothrombin Time: 15.4 seconds — ABNORMAL HIGH (ref 11.4–15.2)

## 2019-05-27 LAB — RETICULOCYTES
Immature Retic Fract: 36.4 % — ABNORMAL HIGH (ref 2.3–15.9)
RBC.: 2.22 MIL/uL — ABNORMAL LOW (ref 4.22–5.81)
Retic Count, Absolute: 125.4 10*3/uL (ref 19.0–186.0)
Retic Ct Pct: 5.7 % — ABNORMAL HIGH (ref 0.4–3.1)

## 2019-05-27 LAB — FOLATE: Folate: 24.5 ng/mL (ref >5.9)

## 2019-05-27 LAB — MRSA PCR SCREENING: MRSA by PCR: NEGATIVE

## 2019-05-27 LAB — TRIGLYCERIDES: Triglycerides: 61 mg/dL (ref <150)

## 2019-05-27 LAB — APTT: aPTT: 33 seconds (ref 24–36)

## 2019-05-27 LAB — PREPARE RBC (CROSSMATCH)

## 2019-05-27 LAB — BRAIN NATRIURETIC PEPTIDE: B Natriuretic Peptide: 186.8 pg/mL — ABNORMAL HIGH (ref 0.0–100.0)

## 2019-05-27 LAB — FIBRINOGEN: Fibrinogen: 543 mg/dL — ABNORMAL HIGH (ref 210–475)

## 2019-05-27 LAB — VITAMIN B12: Vitamin B-12: 432 pg/mL (ref 180–914)

## 2019-05-27 LAB — LACTATE DEHYDROGENASE: LDH: 223 U/L — ABNORMAL HIGH (ref 98–192)

## 2019-05-27 LAB — D-DIMER, QUANTITATIVE: D-Dimer, Quant: 3.31 ug/mL-FEU — ABNORMAL HIGH (ref 0.00–0.50)

## 2019-05-27 MED ORDER — LORAZEPAM 2 MG/ML IJ SOLN
0.5000 mg | Freq: Once | INTRAMUSCULAR | Status: AC
  Administered 2019-05-27: 0.5 mg via INTRAVENOUS
  Filled 2019-05-27: qty 1

## 2019-05-27 MED ORDER — SODIUM CHLORIDE (PF) 0.9 % IJ SOLN
INTRAMUSCULAR | Status: AC
  Filled 2019-05-27: qty 50

## 2019-05-27 MED ORDER — FLAX SEEDS PO POWD: Freq: Every day | ORAL | Status: DC

## 2019-05-27 MED ORDER — ACETAMINOPHEN 325 MG PO TABS: 650.0000 mg | ORAL_TABLET | Freq: Four times a day (QID) | ORAL | Status: DC | PRN

## 2019-05-27 MED ORDER — PROSIGHT PO TABS
1.0000 | ORAL_TABLET | Freq: Two times a day (BID) | ORAL | Status: DC
  Administered 2019-05-27 – 2019-06-02 (×13): 1 via ORAL
  Filled 2019-05-27 (×13): qty 1

## 2019-05-27 MED ORDER — POLYVINYL ALCOHOL 1.4 % OP SOLN
Freq: Four times a day (QID) | OPHTHALMIC | Status: DC | PRN
  Administered 2019-05-31: 12:00:00 via OPHTHALMIC
  Filled 2019-05-27: qty 15

## 2019-05-27 MED ORDER — SALINE SPRAY 0.65 % NA SOLN: 1.0000 | Freq: Every day | NASAL | Status: DC | PRN

## 2019-05-27 MED ORDER — SODIUM CHLORIDE 0.9 % IV SOLN
2.0000 g | Freq: Every day | INTRAVENOUS | Status: DC
  Administered 2019-05-27 – 2019-05-28 (×2): 2 g via INTRAVENOUS
  Filled 2019-05-27: qty 2
  Filled 2019-05-27: qty 20

## 2019-05-27 MED ORDER — METHOCARBAMOL 500 MG PO TABS
1000.0000 mg | ORAL_TABLET | Freq: Every day | ORAL | Status: DC
  Administered 2019-05-27 – 2019-06-01 (×6): 1000 mg via ORAL
  Filled 2019-05-27 (×6): qty 2

## 2019-05-27 MED ORDER — CHLORHEXIDINE GLUCONATE 0.12 % MT SOLN
15.0000 mL | Freq: Two times a day (BID) | OROMUCOSAL | Status: DC
  Administered 2019-05-27 – 2019-06-02 (×12): 15 mL via OROMUCOSAL
  Filled 2019-05-27 (×11): qty 15

## 2019-05-27 MED ORDER — LEVALBUTEROL HCL 1.25 MG/0.5ML IN NEBU
1.2500 mg | INHALATION_SOLUTION | Freq: Four times a day (QID) | RESPIRATORY_TRACT | Status: DC
  Administered 2019-05-27: 1.25 mg via RESPIRATORY_TRACT
  Filled 2019-05-27: qty 0.5

## 2019-05-27 MED ORDER — VITAMIN C 500 MG PO TABS
1000.0000 mg | ORAL_TABLET | Freq: Every day | ORAL | Status: DC
  Administered 2019-05-27 – 2019-06-02 (×7): 1000 mg via ORAL
  Filled 2019-05-27 (×7): qty 2

## 2019-05-27 MED ORDER — NITROGLYCERIN 0.4 MG SL SUBL: 0.4000 mg | SUBLINGUAL_TABLET | SUBLINGUAL | Status: DC | PRN

## 2019-05-27 MED ORDER — LORAZEPAM 2 MG/ML IJ SOLN
INTRAMUSCULAR | Status: AC
  Administered 2019-05-27: 19:00:00 1 mg
  Filled 2019-05-27: qty 1

## 2019-05-27 MED ORDER — VITAMIN B-12 1000 MCG PO TABS
1000.0000 ug | ORAL_TABLET | Freq: Every day | ORAL | Status: DC
  Administered 2019-05-27 – 2019-06-02 (×7): 1000 ug via ORAL
  Filled 2019-05-27 (×7): qty 1

## 2019-05-27 MED ORDER — FUROSEMIDE 10 MG/ML IJ SOLN: 40.0000 mg | Freq: Once | INTRAMUSCULAR | Status: DC

## 2019-05-27 MED ORDER — FLUTICASONE PROPIONATE 50 MCG/ACT NA SUSP: 2.0000 | Freq: Every day | NASAL | Status: DC | PRN

## 2019-05-27 MED ORDER — IPRATROPIUM BROMIDE 0.02 % IN SOLN
0.5000 mg | Freq: Four times a day (QID) | RESPIRATORY_TRACT | Status: DC
  Administered 2019-05-27 (×2): 0.5 mg via RESPIRATORY_TRACT
  Filled 2019-05-27 (×2): qty 2.5

## 2019-05-27 MED ORDER — ORAL CARE MOUTH RINSE
15.0000 mL | Freq: Two times a day (BID) | OROMUCOSAL | Status: DC
  Administered 2019-05-28 – 2019-05-31 (×6): 15 mL via OROMUCOSAL

## 2019-05-27 MED ORDER — SIMVASTATIN 10 MG PO TABS
20.0000 mg | ORAL_TABLET | Freq: Every day | ORAL | Status: DC
  Administered 2019-05-27 – 2019-06-01 (×6): 20 mg via ORAL
  Filled 2019-05-27 (×6): qty 2

## 2019-05-27 MED ORDER — IPRATROPIUM-ALBUTEROL 0.5-2.5 (3) MG/3ML IN SOLN
3.0000 mL | RESPIRATORY_TRACT | Status: DC
  Administered 2019-05-27 – 2019-06-02 (×37): 3 mL via RESPIRATORY_TRACT
  Filled 2019-05-27 (×36): qty 3

## 2019-05-27 MED ORDER — LEVALBUTEROL TARTRATE 45 MCG/ACT IN AERO: 2.0000 | INHALATION_SPRAY | Freq: Four times a day (QID) | RESPIRATORY_TRACT | Status: DC | PRN

## 2019-05-27 MED ORDER — ONDANSETRON HCL 4 MG/2ML IJ SOLN: 4.0000 mg | Freq: Three times a day (TID) | INTRAMUSCULAR | Status: DC | PRN

## 2019-05-27 MED ORDER — SODIUM CHLORIDE 0.9 % IV SOLN
INTRAVENOUS | Status: DC
  Administered 2019-05-27: 16:00:00 via INTRAVENOUS

## 2019-05-27 MED ORDER — TRAMADOL HCL 50 MG PO TABS
50.0000 mg | ORAL_TABLET | Freq: Four times a day (QID) | ORAL | Status: DC | PRN
  Administered 2019-05-27 – 2019-06-01 (×6): 50 mg via ORAL
  Filled 2019-05-27 (×6): qty 1

## 2019-05-27 MED ORDER — METHYLPREDNISOLONE SODIUM SUCC 125 MG IJ SOLR
60.0000 mg | Freq: Three times a day (TID) | INTRAMUSCULAR | Status: DC
  Administered 2019-05-27 – 2019-05-30 (×11): 60 mg via INTRAVENOUS
  Filled 2019-05-27 (×11): qty 2

## 2019-05-27 MED ORDER — VANCOMYCIN HCL 10 G IV SOLR
1250.0000 mg | Freq: Every day | INTRAVENOUS | Status: DC
  Administered 2019-05-28 – 2019-05-29 (×2): 1250 mg via INTRAVENOUS
  Filled 2019-05-27 (×2): qty 1250

## 2019-05-27 MED ORDER — IPRATROPIUM BROMIDE HFA 17 MCG/ACT IN AERS: 2.0000 | INHALATION_SPRAY | RESPIRATORY_TRACT | Status: DC

## 2019-05-27 MED ORDER — BUDESONIDE 0.5 MG/2ML IN SUSP
0.5000 mg | Freq: Two times a day (BID) | RESPIRATORY_TRACT | Status: DC
  Administered 2019-05-27 – 2019-06-02 (×12): 0.5 mg via RESPIRATORY_TRACT
  Filled 2019-05-27 (×12): qty 2

## 2019-05-27 MED ORDER — SODIUM CHLORIDE 0.9 % IV SOLN
10.0000 mL/h | Freq: Once | INTRAVENOUS | Status: AC
  Administered 2019-05-27: 10 mL/h via INTRAVENOUS

## 2019-05-27 MED ORDER — ALBUTEROL SULFATE (2.5 MG/3ML) 0.083% IN NEBU: 2.5000 mg | INHALATION_SOLUTION | Freq: Four times a day (QID) | RESPIRATORY_TRACT | Status: DC | PRN

## 2019-05-27 MED ORDER — SODIUM CHLORIDE 0.9 % IV SOLN
2.0000 g | Freq: Three times a day (TID) | INTRAVENOUS | Status: DC
  Administered 2019-05-27: 2 g via INTRAVENOUS
  Filled 2019-05-27 (×2): qty 2

## 2019-05-27 MED ORDER — CHLORHEXIDINE GLUCONATE CLOTH 2 % EX PADS
6.0000 | MEDICATED_PAD | Freq: Every day | CUTANEOUS | Status: DC
  Administered 2019-05-28 – 2019-06-02 (×7): 6 via TOPICAL

## 2019-05-27 MED ORDER — PANTOPRAZOLE SODIUM 40 MG PO TBEC
40.0000 mg | DELAYED_RELEASE_TABLET | Freq: Every day | ORAL | Status: DC
  Administered 2019-05-27 – 2019-06-02 (×7): 40 mg via ORAL
  Filled 2019-05-27 (×7): qty 1

## 2019-05-27 MED ORDER — DM-GUAIFENESIN ER 30-600 MG PO TB12
1.0000 | ORAL_TABLET | Freq: Two times a day (BID) | ORAL | Status: DC | PRN
  Administered 2019-05-27 – 2019-05-30 (×5): 1 via ORAL
  Filled 2019-05-27: qty 2
  Filled 2019-05-27 (×4): qty 1

## 2019-05-27 MED ORDER — IOHEXOL 350 MG/ML SOLN
100.0000 mL | Freq: Once | INTRAVENOUS | Status: AC | PRN
  Administered 2019-05-27: 100 mL via INTRAVENOUS

## 2019-05-27 MED ORDER — LORAZEPAM 2 MG/ML IJ SOLN
1.0000 mg | Freq: Two times a day (BID) | INTRAMUSCULAR | Status: DC | PRN
  Administered 2019-05-28: 1 mg via INTRAVENOUS
  Filled 2019-05-27: qty 1

## 2019-05-27 MED ORDER — ALPRAZOLAM 0.5 MG PO TABS
0.5000 mg | ORAL_TABLET | Freq: Every day | ORAL | Status: DC
  Administered 2019-05-27 – 2019-06-01 (×6): 0.5 mg via ORAL
  Filled 2019-05-27 (×6): qty 1

## 2019-05-27 MED ORDER — CHOLECALCIFEROL 10 MCG (400 UNIT) PO TABS
400.0000 [IU] | ORAL_TABLET | Freq: Every day | ORAL | Status: DC
  Administered 2019-05-27 – 2019-06-01 (×6): 400 [IU] via ORAL
  Filled 2019-05-27 (×6): qty 1

## 2019-05-27 NOTE — Progress Notes (Signed)
Pharmacy Antibiotic Note  [name] is a 73 y.o. male admitted on 05/26/2019 with pneumonia.  Pharmacy has been consulted for cefepime and vancomycin dosing.  Plan: Cefepime 2 Gm IV q8h Vancomycin 2 Gm x1 then 1250 mg IV q24h for est AUC =520 Goal AUC = 400-550,  Vd=0.5 F/u scr/cultures/levels  Height: 5\' 6"  (167.6 cm) Weight: 200 lb (90.7 kg) IBW/kg (Calculated) : 63.8  Temp (24hrs), Avg:100.2 F (37.9 C), Min:100.2 F (37.9 C), Max:100.2 F (37.9 C)  Recent Labs  Lab 05/26/19 2321  WBC 10.7*  CREATININE 1.00  LATICACIDVEN 2.8*    Estimated Creatinine Clearance: 68.4 mL/min (by C-G formula based on SCr of 1 mg/dL).    Allergies  Allergen Reactions  . Amlodipine Swelling    Antimicrobials this admission: 7/ 12  cefepime >> 7/12 flagyl >>  7/12 vancomycin >>  Dose adjustments this admission:   Microbiology results:  BCx:   UCx:    Sputum:    MRSA PCR:   Thank you for allowing pharmacy to be a part of this patient's care.  Dorrene German 05/27/2019 2:33 AM

## 2019-05-27 NOTE — Progress Notes (Signed)
LE venous duplex       has been completed. Preliminary results can be found under CV proc through chart review. Eleanora Guinyard, BS, RDMS, RVT   

## 2019-05-27 NOTE — Progress Notes (Signed)
PHARMACIST - PHYSICIAN ORDER COMMUNICATION  CONCERNING: P&T Medication Policy on Herbal Medications  DESCRIPTION:  This patient's order for:  Flax seed  has been noted.  This product(s) is classified as an "herbal" or natural product. Due to a lack of definitive safety studies or FDA approval, nonstandard manufacturing practices, plus the potential risk of unknown drug-drug interactions while on inpatient medications, the Pharmacy and Therapeutics Committee does not permit the use of "herbal" or natural products of this type within [facility].   ACTION TAKEN: The pharmacy department is unable to verify this order at this time and your patient has been informed of this safety policy. Please reevaluate patient's clinical condition at discharge and address if the herbal or natural product(s) should be resumed at that time.   Thanks Dorrene German 05/27/2019 4:07 AM

## 2019-05-27 NOTE — ED Notes (Signed)
Lactic 2.6

## 2019-05-27 NOTE — Progress Notes (Signed)
Patients HR sustaining in 120's with increased WOB.  Pt. Severely agitated and complaining of SOB.  Bipap placed on patient.  Dr. Marylyn Ishihara updated on patients condition, order for Ativan 1mg  PRN placed.  RN at bedside and will continue to monitor.

## 2019-05-27 NOTE — Consult Note (Signed)
Consultation Note Date: 05/27/2019   Patient Name: [name]  DOB: 05/31/1946  MRN: [id number]  Age / Sex: 73 y.o., male  PCP: [name], MD Referring Physician: Jonnie Finner, DO  Reason for Consultation: Establishing goals of care  HPI/Patient Profile: 73 y.o. male      admitted on 05/26/2019  [name] is a 72 y.o. 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  Clinical Assessment and Goals of Care:  Patient presented with fever, shortness of breath, chills. He was admitted with COPD exacerbation, sepsis. COVID negative. He initially required BIPAP.   Patient is currently in stepdown unit at [facility] in [place], [place]. He is known to the palliative service, seen by PMT on two occassions, he was seen by PMT ni May 2020 when he was admitted for SBO secondary to tumor. At that time, DNR DNI and a MOST form was filled out, the patient went home with palliative services.   In the ED last night, the patient's CT scan showed No evidence of pulmonary embolism. 2. 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  3 Status post chemoembolization. Hepatic metastases are grossly unchanged. 4. Progressive pulmonary metastases, as described above, althoughpoorly evaluated due to motion degradation. 5. Aortic Atherosclerosis (ICD10-I70.0) and Emphysema (ICD10-J43.9).  A palliative consult has been requested for ongoing goals of care discussions.   The patient is sitting up by the edge of the bed, eating breakfast. He does recall meeting with PMT in previous hospitalizations.    Palliative medicine is specialized medical care for people living with serious illness. It focuses on providing relief from the symptoms and stress of a serious illness. The goal is to improve quality of life for both the patient and the family.  Goals of care: Broad aims of medical therapy in relation to the patient's values and preferences. Our aim is to provide medical care aimed at enabling patients to achieve the goals that matter most to them, given the circumstances of their particular medical situation and their constraints.   Discussed with patient about his current condition, his overall health in the face of serious irreversible illness, ongoing decline and his goals, wishes and values.   The patient has pursed lip breathing, but is able to speak in full sentences.   We talked about hospice philosophy of care and addition of hospice as an extra layer of support. The patient states, he isn't ready for hospice. He states, " I'm not done fighting yet. I'm not going to lay down and die. I know about hospice and I know my body, I'm not ready for it yet."  Offered active listening and supportive care. Patient states out patient palliative services pays him a visit every 2 weeks, he fins their support  helpful and wishes to continue with palliative care for now.   See below.   NEXT OF KIN  lives at home [place].   SUMMARY OF RECOMMENDATIONS    DNR DNI Home with continuation of home based palliative care. Patient doesn't want hospice, doesn't want to talk about hospice services.  Continue current mode of care.   Code Status/Advance Care Planning:  DNR    Symptom Management:    as above   Palliative Prophylaxis:   Delirium Protocol  Psycho-social/Spiritual:   Desire for further Chaplaincy support:yes  Additional Recommendations: Education on Hospice  Prognosis:   Unable to determine  Discharge Planning: Home with Palliative Services       Primary Diagnoses: Present on Admission:  Essential hypertension  Malignant neoplasm of bladder ([place])  HYPERCHOLESTEROLEMIA  Anxiety  COPD with acute exacerbation ([place])  Chronic diastolic CHF (congestive heart failure) ([place])  DNR (do not resuscitate)  OSA (obstructive sleep apnea)  Acute on chronic respiratory failure with hypoxia and hypercapnia ([facility])  Sepsis ([place])  Macrocytic anemia  COPD exacerbation ([facility])  Elevated troponin   I have reviewed the medical record, interviewed the patient and family, and examined the patient. The following aspects are pertinent.  Past Medical History:  Diagnosis Date   Anxiety    Bladder cancer ([place]) 2011   Borderline diabetes mellitus    CAD (coronary artery disease)    Cigarette smoker    COPD (chronic obstructive pulmonary disease) ([facility])    DJD (degenerative joint disease)    Emphysema of lung ([facility])    Epistaxis    History of colonic polyps 2004   hyperplastic    History of sinusitis    Hypercholesteremia    Hypertension    Obesity    Social History   Socioeconomic History   Marital status: Married    Spouse name: [name]   Number of children: 2   Years of education: Not on file   Highest education level: Not on file  Occupational History   Occupation: Retired  Scientist, product/process development strain: Not hard at [employer]    Worry: Never true    Inability: Never true   Transportation needs    Medical: No    Non-medical: No  Tobacco Use   Smoking status: Former Smoker    Packs/day: 2.00    Years: 45.00    Pack years: 90.00    Types: Cigarettes    Quit date: 10/17/2015    Years since quitting: 3.6   Smokeless tobacco: Never Used  Substance and Sexual Activity   Alcohol use: Yes    Alcohol/week: 0.0 standard drinks    Comment: glass of wine at night   Drug use: No   Sexual activity: Not on file  Lifestyle   Physical activity    Days per week: 0 days     Minutes per session: 0 min   Stress: Not at all  Relationships   Social connections    Talks on phone: More than three times a week    Gets together: More than three times a week    Attends religious service: 1 to 4 times per year    Active member of club or organization: Yes    Attends meetings of clubs or organizations: Not on file    Relationship status: Married  Other Topics Concern   Not on file  Social History Narrative   **Patient happily married for 54 years.  Stated no problems in community or home life.   Family History  Problem Relation Age of Onset   Heart disease Mother    Heart disease Brother    Cancer Brother    Aneurysm Brother    Scheduled Meds:  ALPRAZolam  0.5 mg Oral QHS   cholecalciferol  400 Units Oral QHS   ipratropium  0.5 mg Nebulization Q6H   levalbuterol  1.25 mg Nebulization Q6H   methocarbamol  1,000 mg Oral QHS   methylPREDNISolone (SOLU-MEDROL) injection  60 mg Intravenous TID   multivitamin  1 tablet Oral BID   pantoprazole  40 mg Oral Daily   simvastatin  20 mg Oral q1800   sodium chloride (PF)       vitamin B-12  1,000 mcg Oral Daily   vitamin C  1,000 mg Oral Daily   Continuous Infusions:  ceFEPime (MAXIPIME) IV 2 g (05/27/19 0910)   [START ON 05/28/2019] vancomycin     PRN Meds:.acetaminophen, dextromethorphan-guaiFENesin, fluticasone, nitroGLYCERIN, ondansetron, polyvinyl alcohol, sodium chloride, traMADol Medications Prior to Admission:  Prior to Admission medications   Medication Sig Start Date End Date Taking? Authorizing Provider  Albuterol Sulfate (PROAIR RESPICLICK) 315 (90 Base) MCG/ACT AEPB Inhale 2 puffs into the lungs every 6 (six) hours as needed (shortness of breath). 10/26/18   Noralee Space, MD  ALPRAZolam Duanne Moron) 0.5 MG tablet Take 1 tablet (0.5 mg total) by mouth at bedtime. 10/26/18   Noralee Space, MD  Ascorbic Acid (VITAMIN C) 1000 MG tablet Take 1,000 mg by mouth daily.    [provider]  budesonide (PULMICORT) 0.25 MG/2ML nebulizer solution Take 2 mLs (0.25 mg total) by nebulization 2 (two) times daily. 02/12/19   Garner Nash, DO  cholecalciferol (VITAMIN D) 400 units TABS tablet Take 400 Units by mouth at bedtime.    [provider]  Flaxseed, Linseed, (FLAX SEEDS PO) Take 1 capsule by mouth daily.    [provider]  fluticasone (FLONASE) 50 MCG/ACT nasal spray Place 2 sprays into both nostrils daily as needed for allergies or rhinitis.  09/21/18   [provider]  furosemide (LASIX) 40 MG tablet Take 40 mg by mouth daily as needed for fluid.     [provider]  guaiFENesin (MUCINEX) 600 MG 12 hr tablet Take 1,200 mg by mouth 2 (two) times daily.    [provider]  ipratropium-albuterol (DUONEB) 0.5-2.5 (3) MG/3ML SOLN Take 3 mLs by nebulization every 4 (four) hours as needed. Patient taking differently: Take 3 mLs by nebulization See admin instructions. Take every 4 hours. Take additional doses every 4 hours as needed for shortness of breath. 02/12/19   Icard, Octavio Graves, DO  methocarbamol (ROBAXIN) 500 MG tablet Take 1,000 mg by mouth at bedtime.  03/07/18   [provider]  multivitamin-lutein (OCUVITE-LUTEIN) CAPS capsule Take 1 capsule by mouth 2 (two) times a day.     [provider]  nitroGLYCERIN (NITROSTAT) 0.4 MG SL tablet Place 1 tablet (0.4 mg total) under the tongue every 5 (five) minutes as needed for chest pain. 11/19/15   Richardson Dopp T, PA-C  ondansetron (ZOFRAN-ODT) 8 MG disintegrating tablet Take 8 mg by mouth every 8 (eight) hours as needed for nausea/vomiting. 04/02/19   [provider]  pantoprazole (PROTONIX) 40 MG tablet Take 40 mg by mouth daily. 03/03/19   [provider]  Polyethyl Glycol-Propyl Glycol (SYSTANE OP) Apply 1 drop to eye every 6 (six) hours as needed (dry eyes).  [provider]  simvastatin (ZOCOR) 20 MG tablet Take 1 tablet (20 mg  total) by mouth daily at 6 PM. 11/19/15   Kathlen Mody, Nicki Reaper T, PA-C  sodium chloride (OCEAN) 0.65 % SOLN nasal spray Place 1 spray into both nostrils as needed for congestion. Patient taking differently: Place 1 spray into both nostrils daily as needed for congestion.  10/29/15   Cherene Altes, MD  traMADol (ULTRAM) 50 MG tablet Take 1 tablet (50 mg total) by mouth every 6 (six) hours as needed for moderate pain. 03/30/19   Eugenie Filler, MD  vitamin B-12 (CYANOCOBALAMIN) 1000 MCG tablet Take 1,000 mcg by mouth daily.    [provider]   Allergies  Allergen Reactions   Amlodipine Swelling   Review of Systems +shortness of breath.  Physical Exam Awake alert Sitting by the edge of the bed Now on 6L [place] Trace edema LE Abdomen distended S 1 S 2  Decreased air movement.   Vital Signs: BP (!) 103/45    Pulse 85    Temp 97.6 F (36.4 C) (Oral)    Resp (!) 27    Ht 5\' 6"  (1.676 m)    Wt 90.7 kg    SpO2 97%    BMI 32.28 kg/m  Pain Scale: 0-10   Pain Score: 0-No pain   SpO2: SpO2: 97 % O2 Device:SpO2: 97 % O2 Flow Rate: .   IO: Intake/output summary:   Intake/Output Summary (Last 24 hours) at 05/27/2019 0349 Last data filed at 05/27/2019 0915 Gross per 24 hour  Intake 2012.09 ml  Output 150 ml  Net 1862.09 ml    LBM: Last BM Date: 05/26/19 Baseline Weight: Weight: 90.7 kg Most recent weight: Weight: 90.7 kg     Palliative Assessment/Data:  PPS 40%   Time In:  8 Time Out:9   Time Total : 60 min    Greater than 50%  of this time was spent counseling and coordinating care related to the above assessment and plan.  Signed by: Loistine Chance, MD [phone]   Please contact Palliative Medicine Team phone at [phone] for questions and concerns.  For individual provider: See Shea Evans

## 2019-05-27 NOTE — Progress Notes (Signed)
Pt took off BIPAP at pt request. Pt placed on 6LPM [place] home regimen.

## 2019-05-27 NOTE — Progress Notes (Signed)
Pt not on BIPAP at this time.

## 2019-05-27 NOTE — Progress Notes (Signed)
CRITICAL VALUE ALERT  Critical Value:  Lactic Acid 3.2  Date & Time Notied:  05/27/2019 1329  Provider Notified: Marylyn Ishihara, MD  Orders Received/Actions taken: No new orders at this time

## 2019-05-27 NOTE — H&P (Addendum)
History and Physical    [name] VPX:[id number] DOB: 09/23/1946 DOA: 05/26/2019  Referring MD/NP/PA:   PCP: [name], MD   Patient coming from:  The patient is coming from home.  At baseline, pt is independent for most of ADL.        Chief Complaint: Shortness of breath, fever  HPI: [name] is a 73 y.o. 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  Patient states that he started having worsening shortness of breath, fever and chills since this afternoon.  He had temperature 102 at home.  He has dry cough, denies chest pain, runny nose or sore throat.  Per report, he had oxygen desaturation to 70s% on nonrebreather, which improved to 98% on BiPAP.  Patient denies nausea, vomiting, diarrhea, abdominal pain, symptoms of UTI.  He denies dark stool or rectal bleeding currently.  Of note, patient has history of GI bleeding, and getting frequent blood transfusion.  He refused GI evaluation recently.  ED Course: pt was found to have negative COVID-19 test, d-dimer 3.01, BNP 186.8, lactic acid of 2.8, INR 1.2, PTT 33, lipase 31, WBC 10.7, trop 17-->20, hemoglobin 7.6 (7.8 on 05/07/2019, 8.3 on 05/06/2019), liver function (ALP 119, AST 59, ALT 47, total bilirubin 4.4), LDH 223, procalcitonin 4.29, fibrinogen 543, pending TG and a CRP.  Chest x-ray showed COPD and possible metastasized to disease.  Patient is admitted to stepdown setting patient.  CAT showed: 1. No evidence of pulmonary embolism. 2. 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  3 Status post chemoembolization. Hepatic metastases are grossly unchanged. 4. Progressive pulmonary metastases, as described above,  althoughpoorly evaluated due to motion degradation. 5. Aortic Atherosclerosis (ICD10-I70.0) and Emphysema (ICD10-J43.9).  Review of Systems:   General: has fevers, chills, no body weight gain, has poor appetite, has fatigue HEENT: no blurry vision, hearing changes or sore throat Respiratory: has dyspnea, coughing, wheezing CV: no chest pain, no palpitations GI: no nausea, vomiting, abdominal pain, diarrhea, constipation GU: no dysuria, burning on urination, increased urinary frequency, hematuria  Ext: no leg edema Neuro: no unilateral weakness, numbness, or tingling, no vision change or hearing loss Skin: no rash, no skin tear. MSK: No muscle spasm, no deformity, no limitation of range of movement in spin Heme: No easy bruising.  Travel history: No recent long distant travel.  Allergy:  Allergies  Allergen Reactions   Amlodipine Swelling    Past Medical History:  Diagnosis Date   Anxiety    Bladder cancer ([place]) 2011   Borderline diabetes mellitus    CAD (coronary artery disease)    Cigarette smoker    COPD (chronic obstructive pulmonary disease) (HCC)    DJD (degenerative joint disease)    Emphysema of lung (HCC)    Epistaxis    History of colonic polyps 2004   hyperplastic    History of sinusitis    Hypercholesteremia    Hypertension    Obesity     Past Surgical History:  Procedure Laterality Date   cataract surgery  septemeber 2013   cataract surgery/sub posterior vitrectomy in left eye  1996   Dr. Zadie Rhine   [facility]  [phone]    Social History:  reports that he quit smoking about 3 years  ago. His smoking use included cigarettes. He has a 90.00 pack-year smoking history. He has never used smokeless tobacco. He reports current alcohol use. He reports that he does not use drugs.  Family History:  Family History  Problem Relation Age of Onset   Heart disease Mother    Heart disease Brother    Cancer Brother    Aneurysm Brother       Prior to Admission medications   Medication Sig Start Date End Date Taking? Authorizing Provider  Albuterol Sulfate (PROAIR RESPICLICK) 161 (90 Base) MCG/ACT AEPB Inhale 2 puffs into the lungs every 6 (six) hours as needed (shortness of breath). 10/26/18   Noralee Space, MD  ALPRAZolam Duanne Moron) 0.5 MG tablet Take 1 tablet (0.5 mg total) by mouth at bedtime. 10/26/18   Noralee Space, MD  Ascorbic Acid (VITAMIN C) 1000 MG tablet Take 1,000 mg by mouth daily.    [provider]  budesonide (PULMICORT) 0.25 MG/2ML nebulizer solution Take 2 mLs (0.25 mg total) by nebulization 2 (two) times daily. 02/12/19   Garner Nash, DO  cholecalciferol (VITAMIN D) 400 units TABS tablet Take 400 Units by mouth at bedtime.    [provider]  Flaxseed, Linseed, (FLAX SEEDS PO) Take 1 capsule by mouth daily.    [provider]  fluticasone (FLONASE) 50 MCG/ACT nasal spray Place 2 sprays into both nostrils daily as needed for allergies or rhinitis.  09/21/18   [provider]  furosemide (LASIX) 40 MG tablet Take 40 mg by mouth daily as needed for fluid.     [provider]  guaiFENesin (MUCINEX) 600 MG 12 hr tablet Take 1,200 mg by mouth 2 (two) times daily.    [provider]  ipratropium-albuterol (DUONEB) 0.5-2.5 (3) MG/3ML SOLN Take 3 mLs by nebulization every 4 (four) hours as needed. Patient taking differently: Take 3 mLs by nebulization See admin instructions. Take every 4 hours. Take additional doses every 4 hours as needed for shortness of breath. 02/12/19   Icard, Octavio Graves, DO  methocarbamol (ROBAXIN) 500 MG tablet Take 1,000 mg by mouth at bedtime.  03/07/18   [provider]  multivitamin-lutein (OCUVITE-LUTEIN) CAPS capsule Take 1 capsule by mouth 2 (two) times a day.     [provider]  nitroGLYCERIN (NITROSTAT) 0.4 MG SL tablet Place 1 tablet (0.4 mg total) under the tongue every 5 (five) minutes as needed for chest pain. 11/19/15    Richardson Dopp T, PA-C  ondansetron (ZOFRAN-ODT) 8 MG disintegrating tablet Take 8 mg by mouth every 8 (eight) hours as needed for nausea/vomiting. 04/02/19   [provider]  pantoprazole (PROTONIX) 40 MG tablet Take 40 mg by mouth daily. 03/03/19   [provider]  Polyethyl Glycol-Propyl Glycol (SYSTANE OP) Apply 1 drop to eye every 6 (six) hours as needed (dry eyes).     [provider]  simvastatin (ZOCOR) 20 MG tablet Take 1 tablet (20 mg total) by mouth daily at 6 PM. 11/19/15   Kathlen Mody, Nicki Reaper T, PA-C  sodium chloride (OCEAN) 0.65 % SOLN nasal spray Place 1 spray into both nostrils as needed for congestion. Patient taking differently: Place 1 spray into both nostrils daily as needed for congestion.  10/29/15   Cherene Altes, MD  traMADol (ULTRAM) 50 MG tablet Take 1 tablet (50 mg total) by mouth every 6 (six) hours as needed for moderate pain. 03/30/19   Eugenie Filler, MD  vitamin B-12 (CYANOCOBALAMIN) 1000 MCG tablet Take  1,000 mcg by mouth daily.    [provider]    Physical Exam: Vitals:   05/27/19 0230 05/27/19 0337 05/27/19 0341 05/27/19 0349  BP: (!) 108/57 126/66    Pulse: (!) 125 (!) 102    Resp: 16 (!) 24    Temp:    (!) 97.5 F (36.4 C)  TempSrc:    Axillary  SpO2: 99% 100% 100%   Weight:      Height:    5\' 6"  (1.676 m)   General: Not in acute distress HEENT:       Eyes: PERRL, EOMI, no scleral icterus.       ENT: No discharge from the ears and nose, no pharynx injection, no tonsillar enlargement.        Neck: No JVD, no bruit, no mass felt. Heme: No neck lymph node enlargement. Cardiac: S1/S2, RRR, No murmurs, No gallops or rubs. Respiratory: decreased air movement bilaterally, has wheezing bilaterally GI: Soft, nondistended, nontender, no rebound pain, no organomegaly, BS present. GU: No hematuria Ext: No pitting leg edema bilaterally. 2+DP/PT pulse bilaterally. Musculoskeletal: No joint deformities, No joint redness or  warmth, no limitation of ROM in spin. Skin: No rashes.  Neuro: Alert, oriented X3, cranial nerves II-XII grossly intact, moves all extremities normally.   Psych: Patient is not psychotic, no suicidal or hemocidal ideation.  Labs on Admission: I have personally reviewed following labs and imaging studies  CBC: Recent Labs  Lab 05/26/19 2321  WBC 10.7*  NEUTROABS 9.7*  HGB 7.6*  HCT 25.3*  MCV 100.0  PLT 676   Basic Metabolic Panel: Recent Labs  Lab 05/26/19 2321  NA 136  K 3.5  CL 101  CO2 24  GLUCOSE 148*  BUN 22  CREATININE 1.00  CALCIUM 8.3*   GFR: Estimated Creatinine Clearance: 68.4 mL/min (by C-G formula based on SCr of 1 mg/dL). Liver Function Tests: Recent Labs  Lab 05/26/19 2321  AST 59*  ALT 47*  ALKPHOS 119  BILITOT 4.4*  PROT 6.8  ALBUMIN 3.3*   Recent Labs  Lab 05/26/19 2321  LIPASE 31   No results for input(s): AMMONIA in the last 168 hours. Coagulation Profile: Recent Labs  Lab 05/26/19 2321  INR 1.2   Cardiac Enzymes: No results for input(s): CKTOTAL, CKMB, CKMBINDEX, TROPONINI in the last 168 hours. BNP (last 3 results) No results for input(s): PROBNP in the last 8760 hours. HbA1C: No results for input(s): HGBA1C in the last 72 hours. CBG: No results for input(s): GLUCAP in the last 168 hours. Lipid Profile: Recent Labs    05/26/19 2329  TRIG 61   Thyroid Function Tests: No results for input(s): TSH, T4TOTAL, FREET4, T3FREE, THYROIDAB in the last 72 hours. Anemia Panel: Recent Labs    05/27/19 0130 05/27/19 0134 05/27/19 0154  VITAMINB12  --  432  --   FOLATE  --  24.5  --   FERRITIN  --   --  868*  TIBC  --  220*  --   IRON  --  20*  --   RETICCTPCT 5.7*  --   --    Urine analysis:    Component Value Date/Time   COLORURINE AMBER (A) 05/27/2019 0154   APPEARANCEUR CLEAR 05/27/2019 0154   LABSPEC 1.032 (H) 05/27/2019 0154   PHURINE 5.0 05/27/2019 0154   GLUCOSEU NEGATIVE 05/27/2019 0154   GLUCOSEU NEGATIVE  06/30/2010 1050   HGBUR NEGATIVE 05/27/2019 0154   BILIRUBINUR SMALL (A) 05/27/2019 0154   Benjamin Stain  NEGATIVE 05/27/2019 0154   PROTEINUR 30 (A) 05/27/2019 0154   UROBILINOGEN 0.2 06/30/2010 1050   NITRITE NEGATIVE 05/27/2019 0154   LEUKOCYTESUR NEGATIVE 05/27/2019 0154   Sepsis Labs: @LABRCNTIP (procalcitonin:4,lacticidven:4) ) Recent Results (from the past 240 hour(s))  SARS Coronavirus 2 (CEPHEID- Performed in [place] hospital lab), Hosp Order     Status: None   Collection Time: 05/26/19 11:28 PM   Specimen: Nasopharyngeal Swab  Result Value Ref Range Status   SARS Coronavirus 2 NEGATIVE NEGATIVE Final    Comment: (NOTE) If result is NEGATIVE SARS-CoV-2 target nucleic acids are NOT DETECTED. The SARS-CoV-2 RNA is generally detectable in upper and lower  respiratory specimens during the acute phase of infection. The lowest  concentration of SARS-CoV-2 viral copies this assay can detect is 250  copies / mL. A negative result does not preclude SARS-CoV-2 infection  and should not be used as the sole basis for treatment or other  patient management decisions.  A negative result may occur with  improper specimen collection / handling, submission of specimen other  than nasopharyngeal swab, presence of viral mutation(s) within the  areas targeted by this assay, and inadequate number of viral copies  (<250 copies / mL). A negative result must be combined with clinical  observations, patient history, and epidemiological information. If result is POSITIVE SARS-CoV-2 target nucleic acids are DETECTED. The SARS-CoV-2 RNA is generally detectable in upper and lower  respiratory specimens dur ing the acute phase of infection.  Positive  results are indicative of active infection with SARS-CoV-2.  Clinical  correlation with patient history and other diagnostic information is  necessary to determine patient infection status.  Positive results do  not rule out bacterial infection or  co-infection with other viruses. If result is PRESUMPTIVE POSTIVE SARS-CoV-2 nucleic acids MAY BE PRESENT.   A presumptive positive result was obtained on the submitted specimen  and confirmed on repeat testing.  While 2019 novel coronavirus  (SARS-CoV-2) nucleic acids may be present in the submitted sample  additional confirmatory testing may be necessary for epidemiological  and / or clinical management purposes  to differentiate between  SARS-CoV-2 and other Sarbecovirus currently known to infect humans.  If clinically indicated additional testing with an alternate test  methodology [phone]) is advised. The SARS-CoV-2 RNA is generally  detectable in upper and lower respiratory sp ecimens during the acute  phase of infection. The expected result is Negative. Fact Sheet for Patients:  StrictlyIdeas.no Fact Sheet for Healthcare Providers: BankingDealers.co.za This test is not yet approved or cleared by the [place] FDA and has been authorized for detection and/or diagnosis of SARS-CoV-2 by FDA under an Emergency Use Authorization (EUA).  This EUA will remain in effect (meaning this test can be used) for the duration of the COVID-19 declaration under Section 564(b)(1) of the Act, 21 U.S.C. section 360bbb-3(b)(1), unless the authorization is terminated or revoked sooner. Performed at [facility], [place] [address] [place], [place] [address]   Blood Culture (routine x 2)     Status: None (Preliminary result)   Collection Time: 05/26/19 11:29 PM   Specimen: BLOOD  Result Value Ref Range Status   Specimen Description BLOOD RIGHT ANTECUBITAL  Final   Special Requests   Final    BOTTLES DRAWN AEROBIC AND ANAEROBIC Blood Culture adequate volume Performed at [facility], [place] [address], [place], [place] [address]    Culture PENDING  Incomplete   Report Status PENDING  Incomplete  Blood Culture (  routine x 2)      Status: None (Preliminary result)   Collection Time: 05/26/19 11:34 PM   Specimen: BLOOD  Result Value Ref Range Status   Specimen Description BLOOD LEFT ANTECUBITAL  Final   Special Requests   Final    BOTTLES DRAWN AEROBIC AND ANAEROBIC Blood Culture adequate volume Performed at [facility], [place] [address], [place], [place] [address]    Culture PENDING  Incomplete   Report Status PENDING  Incomplete     Radiological Exams on Admission: Ct Angio Chest Pe W And/or Wo Contrast  Result Date: 05/27/2019 CLINICAL DATA:  Shortness of breath, weakness, fever. 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 EXAM: CT ANGIOGRAPHY CHEST CT ABDOMEN AND PELVIS WITH CONTRAST TECHNIQUE: Multidetector CT imaging of the chest was performed using the standard protocol during bolus administration of intravenous contrast. Multiplanar CT image reconstructions and MIPs were obtained to evaluate the vascular anatomy. Multidetector CT imaging of the abdomen and pelvis was performed using the standard protocol during bolus administration of intravenous contrast. CONTRAST:  18mL OMNIPAQUE IOHEXOL 350 MG/ML SOLN COMPARISON:  03/27/2019. FINDINGS: CTA CHEST FINDINGS Cardiovascular: Motion degraded images. Within that constraint, there is satisfactory opacification of the bilateral pulmonary arteries to the lobar level. No evidence of pulmonary embolism. No evidence of thoracic aortic aneurysm. Atherosclerotic calcifications of the aortic arch. Heart is normal in size.  No pericardial effusion. Three vessel coronary atherosclerosis. Mediastinum/Nodes: No suspicious mediastinal lymphadenopathy. Visualized thyroid is unremarkable. Lungs/Pleura: Bilateral pulmonary nodules/metastases, motion degraded but favored to be mildly progressive, including: --1.9 cm right upper lobe nodule (series 12/image 53), previously 1.5 cm --2.4 cm superior segment left lower lobe nodule (series 12/image 53),  previously 1.8 cm --3.5 cm central right lower lobe mass (series 12/image 109), previously 2.6 cm Extensive centrilobular and paraseptal emphysematous changes, upper lung predominant. No focal consolidation. No pleural effusion or pneumothorax. Musculoskeletal: Degenerative changes of the thoracic spine. Review of the MIP images confirms the above findings. CT ABDOMEN and PELVIS FINDINGS Motion degraded images. Hepatobiliary: Embolization coils within a branch of the distal left hepatic artery. Partially calcified treated left hepatic lesion in segment 2 measures 5.2 x 3.7 cm, previously 4.8 x 3.7 cm, grossly unchanged. 3.5 x 2.9 cm hypodense lesion in segment 4A (series 4/image 11), previously 3.8 x 2.8 cm, unchanged. Additional 14 mm lesion inferiorly in the right hepatic lobe (series 4/image 27), similar to the prior. Gallbladder is unremarkable. No intrahepatic or extrahepatic ductal dilatation. Pancreas: Within normal limits. Spleen: Within normal limits. Adrenals/Urinary Tract: Adrenal glands are within normal limits. 6.8 cm right upper pole renal cyst. Left kidney is within normal limits. No hydronephrosis. Bladder is underdistended but unremarkable. Stomach/Bowel: Stomach is within normal limits. No evidence of bowel obstruction. Normal appendix (series 4/image 46). Left colon is decompressed. Mild sigmoid diverticulosis, without evidence of diverticulitis. Vascular/Lymphatic: No evidence of abdominal aortic aneurysm. Atherosclerotic calcifications of the abdominal aorta and branch vessels. No suspicious abdominopelvic lymphadenopathy. Reproductive: Prostate is unremarkable. Other: 16.7 x 26.0 x 18.7 cm mass in the right mid abdomen, previously 16.0 x 24.0 x 18.5 cm, mildly increased (calculated volume = 4250 mL versus 3720 mL on the prior). Interval development of necrosis with gas along the left superior aspect of the mass (series 4/image 45). No abdominopelvic ascites. Musculoskeletal: Degenerative  changes of the visualized thoracolumbar spine, most prominent at L5-S1. Review of the MIP images confirms the above findings. IMPRESSION: No evidence of pulmonary embolism. 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 Status post chemoembolization. Hepatic metastases are grossly unchanged. Progressive pulmonary metastases, as described above, although poorly evaluated due to motion degradation. Aortic Atherosclerosis (ICD10-I70.0) and Emphysema (ICD10-J43.9). Electronically Signed   By: Julian Hy M.D.   On: 05/27/2019 03:48   Ct Abdomen Pelvis W Contrast  Result Date: 05/27/2019 CLINICAL DATA:  Shortness of breath, weakness, fever. 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 EXAM: CT ANGIOGRAPHY CHEST CT ABDOMEN AND PELVIS WITH CONTRAST TECHNIQUE: Multidetector CT imaging of the chest was performed using the standard protocol during bolus administration of intravenous contrast. Multiplanar CT image reconstructions and MIPs were obtained to evaluate the vascular anatomy. Multidetector CT imaging of the abdomen and pelvis was performed using the standard protocol during bolus administration of intravenous contrast. CONTRAST:  175mL OMNIPAQUE IOHEXOL 350 MG/ML SOLN COMPARISON:  03/27/2019. FINDINGS: CTA CHEST FINDINGS Cardiovascular: Motion degraded images. Within that constraint, there is satisfactory opacification of the bilateral pulmonary arteries to the lobar level. No evidence of pulmonary embolism. No evidence of thoracic aortic aneurysm. Atherosclerotic calcifications of the aortic arch. Heart is normal in size.  No pericardial effusion. Three vessel coronary atherosclerosis. Mediastinum/Nodes: No suspicious mediastinal lymphadenopathy. Visualized thyroid is unremarkable. Lungs/Pleura: Bilateral pulmonary nodules/metastases, motion degraded but favored to be mildly progressive, including: --1.9 cm right  upper lobe nodule (series 12/image 53), previously 1.5 cm --2.4 cm superior segment left lower lobe nodule (series 12/image 53), previously 1.8 cm --3.5 cm central right lower lobe mass (series 12/image 109), previously 2.6 cm Extensive centrilobular and paraseptal emphysematous changes, upper lung predominant. No focal consolidation. No pleural effusion or pneumothorax. Musculoskeletal: Degenerative changes of the thoracic spine. Review of the MIP images confirms the above findings. CT ABDOMEN and PELVIS FINDINGS Motion degraded images. Hepatobiliary: Embolization coils within a branch of the distal left hepatic artery. Partially calcified treated left hepatic lesion in segment 2 measures 5.2 x 3.7 cm, previously 4.8 x 3.7 cm, grossly unchanged. 3.5 x 2.9 cm hypodense lesion in segment 4A (series 4/image 11), previously 3.8 x 2.8 cm, unchanged. Additional 14 mm lesion inferiorly in the right hepatic lobe (series 4/image 27), similar to the prior. Gallbladder is unremarkable. No intrahepatic or extrahepatic ductal dilatation. Pancreas: Within normal limits. Spleen: Within normal limits. Adrenals/Urinary Tract: Adrenal glands are within normal limits. 6.8 cm right upper pole renal cyst. Left kidney is within normal limits. No hydronephrosis. Bladder is underdistended but unremarkable. Stomach/Bowel: Stomach is within normal limits. No evidence of bowel obstruction. Normal appendix (series 4/image 46). Left colon is decompressed. Mild sigmoid diverticulosis, without evidence of diverticulitis. Vascular/Lymphatic: No evidence of abdominal aortic aneurysm. Atherosclerotic calcifications of the abdominal aorta and branch vessels. No suspicious abdominopelvic lymphadenopathy. Reproductive: Prostate is unremarkable. Other: 16.7 x 26.0 x 18.7 cm mass in the right mid abdomen, previously 16.0 x 24.0 x 18.5 cm, mildly increased (calculated volume = 4250 mL versus 3720 mL on the prior). Interval development of necrosis with  gas along the left superior aspect of the mass (series 4/image 45). No abdominopelvic ascites. Musculoskeletal: Degenerative changes of the visualized thoracolumbar spine, most prominent at L5-S1. Review of the MIP images confirms the above findings. IMPRESSION: No evidence of pulmonary embolism. 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 Status post chemoembolization. Hepatic metastases are grossly unchanged. Progressive pulmonary metastases, as described above, although poorly evaluated due to motion degradation. Aortic Atherosclerosis (ICD10-I70.0) and Emphysema (  ICD10-J43.9). Electronically Signed   By: Julian Hy M.D.   On: 05/27/2019 03:48   Dg Chest Port 1 View  Result Date: 05/26/2019 CLINICAL DATA:  Shortness of breath and fever for several hours EXAM: PORTABLE CHEST 1 VIEW COMPARISON:  05/06/2019 FINDINGS: Cardiac shadow is stable. Aortic calcifications are seen. The lungs are well aerated bilaterally with emphysematous changes and crowding of the markings in the bases. Multiple bilateral pulmonary nodules are seen. Slight increased density in the bases is noted when compare with the prior exam likely representing some superimposed atelectatic change. IMPRESSION: Multifocal pulmonary nodules consistent with metastatic disease. Mild increased atelectasis in the bases superimposed over changes of COPD. Electronically Signed   By: Inez Catalina M.D.   On: 05/26/2019 23:46     EKG: Independently reviewed.  Sinus rhythm, QTC 449, low voltage, PAC, LAE, ST depression in V4-V6.    Assessment/Plan Principal Problem:   Acute on chronic respiratory failure with hypoxia and hypercapnia (HCC) Active Problems:   Malignant neoplasm of bladder (HCC)   HYPERCHOLESTEROLEMIA   Anxiety   Essential hypertension   Macrocytic anemia   COPD with acute exacerbation (HCC)   Chronic diastolic CHF (congestive heart failure) (HCC)   DNR (do not  resuscitate)   Elevated troponin   OSA (obstructive sleep apnea)   Sepsis ([place])   COPD exacerbation ([place])   Acute on chronic respiratory failure with hypoxia and hypercapnia due to COPD exacerbation and sepsis: due to suspecting of COVID-19 initially, ED physician ordered related test, d-dimer is +3.31.  Will need to rule out PE.  Patient remains critical for sepsis with leukocytosis, fever, tachycardia and tachypnea.  Blood pressure soft, initially 97/56, which improved to 123/50 after giving 1 liter of normal saline bolus.  Currently hemodynamically stable.  Chest x-ray showed metastatic disease and COPD.  Due to sepsis, cannot completely rule out HCAP.  ABG with pH of 7.391, PCO2 41, PaO2 157. CTA negative for PE  -will admit patient to SDU as inpt - on BiPAP -Inhaler: Atrovent inhaler, prn  Xopenex inhaler -Solu-Medrol 60 mg IV tid - -Mucinex for cough  -Incentive spirometry -Urine S. pneumococcal antigen -Follow up blood culture x2, sputum culture, respiratory virus panel, Flu pcr -Nasal cannula oxygen as needed to maintain O2 saturation 93% or greater -f/u LE to r/o DVT -will get Procalcitonin and trend lactic acid levels per sepsis protocol. -IVF: 1L of NS bolus in ED (patient has a congestive heart failure, limiting aggressive IV fluids treatment).  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  Patient is s/p of radiation therapy, no surgery and chemotherapy per patient. -Palliative consult  HYPERCHOLESTEROLEMIA: -zocor  Anxiety: -xanax  Essential hypertension: Blood pressures are soft -hold lasix due to sepsis  Macrocytic anemia: Hemoglobin 7.6 (7.8 on 05/07/2019, 8.3 on 05/06/2019).  Denies rectal bleeding or dark stool. Pt had symptomatic anemia which required 2 units pRBC transfusion. Pt has possible GI loss, but he denies declined evaluation by surgery or GI -1 unit of blood was ordered by  ED physician. - his plan is for intermittent transfusions going forward as needed  Chronic diastolic CHF (congestive heart failure) ([place]): BNP 186, CXR did not show pulmonary edema. -Hold Lasix due to sepsis  OSA (obstructive sleep apnea): -on BiPAP  Elevated troponin: Troponins are minimally elevated, 17/20.  Denies any chest pain most likely due to demand ischemia secondary to sepsis and hypoxia. - No aspirin due to possible GI  bleeding -Continue Zocor -PRN nitroglycerin -Repeat EKG in the morning -Check A1c and FLP    Inpatient status:  # Patient requires inpatient status due to high intensity of service, high risk for further deterioration and high frequency of surveillance required.  I certify that at the point of admission it is my clinical judgment that the patient will require inpatient hospital care spanning beyond 2 midnights from the point of admission.   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  Now patient has presenting with acute respiratory failure with hypoxia, COPD exacerbation, sepsis  The worrisome physical exam findings include decreased air movement bilaterally, wheezing on auscultation  The initial radiographic and laboratory data are worrisome because of leukocytosis, positive d-dimer, anemia, elevated lactic acid. CTA showed an enlarged tumor size, hepatic metastases, progressive pulmonary metastasis.  Current medical needs: please see my assessment and plan  Predictability of an adverse outcome (risk): Patient has multiple severe comorbidities, now presents with acute respiratory failure with hypoxia possibly due to COPD exacerbation, but cannot completely rule out HCAP since patient is sepsis.   Patient's presentation is highly complicated.  Patient is at a high risk of deterioration.  Patient will need to be treated in hospital for at least 2 days.     DVT ppx: none (due to possible GI bleeding, cannot use Lovenox or heparin; Need to rule out DVT, cannot use SCD now). Code Status: DNR (I discussed with the patient about CODE STATUS, and explained the meaning of her CODE STATUS, he is very sure that he wants to be DNR). Family Communication: None at bed side.     Disposition Plan:  Anticipate discharge back to previous home environment Consults called:  none Admission status: SDU/inpation       Date of Service 05/27/2019    [place] [facility]   If 7PM-7AM, please contact night-coverage [email or web address] Password [facility] 05/27/2019, 4:12 AM

## 2019-05-27 NOTE — Progress Notes (Addendum)
[name] Kitchen  PROGRESS NOTE    [name]  [name] DOB: September 10, 1946 DOA: 05/26/2019 PCP: [name], MD   Brief Narrative:   [name] is a 73 y.o. 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  Patient states that he started having worsening shortness of breath, fever and chills since this afternoon.  He had temperature 102 at home.  He has dry cough, denies chest pain, runny nose or sore throat.  Per report, he had oxygen desaturation to 70s% on nonrebreather, which improved to 98% on BiPAP.  Patient denies nausea, vomiting, diarrhea, abdominal pain, symptoms of UTI.  He denies dark stool or rectal bleeding currently.  Of note, patient has history of GI bleeding, and getting frequent blood transfusion.  He refused GI evaluation recently.   Assessment & Plan:   Principal Problem:   Acute on chronic respiratory failure with hypoxia and hypercapnia (HCC) Active Problems:   Malignant neoplasm of bladder (HCC)   HYPERCHOLESTEROLEMIA   Anxiety   Essential hypertension   Macrocytic anemia   COPD with acute exacerbation (HCC)   Chronic diastolic CHF (congestive heart failure) (HCC)   DNR (do not resuscitate)   Elevated troponin   OSA (obstructive sleep apnea)   Sepsis (HCC)   COPD exacerbation (HCC)   Shortness of breath   Acute on chronic respiratory failure with hypoxia and hypercapnia due to COPD exacerbation and sepsis:     - COVID testing negative     - elevated d-dimer; CTA negative for PE     - leukocytosis, fever, tachycardia, tachypnea     - BP responsive to fluids     - CXR w/ metastatic disease/COPD     - ABG: ph 7.391, pCO2 41, pO2 157     - vanc/cefepime     - Inhaler: Atrovent inhaler, prn  Xopenex  inhaler     - Solu-Medrol 60 mg IV tid     - Mucinex for cough      - Incentive spirometry     - Bld Cx +GPR/GNR x 2     - Urine S. pneumococcal antigen, resp viral panel, sputum Cx all pending     - f/u LE to r/o DVT     - procal: 4     - lactic acid: 2.8 -> 2.6 -> 3.2     - Hx of HF, limit IVF -- he is also DNR  Malignant neoplasm of bladder     - CTA showed an enlarged tumor size, hepatic metastases, progressive pulmonary metastasis.       - Patient is s/p radiation therapy, no surgery and chemotherapy per patient.     - Palliative consult: "DNR DNI. Home with continuation of home based palliative care. Patient doesn't want hospice, doesn't want to talk about hospice services.  Continue current mode of care."   HLD     - zocor  Anxiety:     - xanax  Essential hypertension:      - Blood pressures are soft     - hold lasix due to sepsis  Macrocytic anemia:      - Hemoglobin 7.6 (7.8 on 05/07/2019, 8.3 on 05/06/2019).       - Denies rectal bleeding or dark stool.      - Pt  had symptomatic anemia which required 2 units pRBC transfusion. Pt has possible GI loss, but he denies declined evaluation by surgery or GI     - 1 unit of blood was ordered by ED physician.     - his plan is for intermittent transfusions going forward as needed  Chronic diastolic CHF:      - BNP 403, CXR did not show pulmonary edema.     - Hold Lasix due to sepsis  OSA:     - on BiPAP  Elevated troponin:      - Troponins are minimally elevated, 17/20.       - Denies any chest pain most likely due to demand ischemia secondary to sepsis and hypoxia.     - No aspirin due to possible GI bleeding     - Continue Zocor     - PRN nitroglycerin  Lactic acid still rising. Let's add some fluids.  Spoke with wife by phone at 1635hrs.  DVT prophylaxis: SCD Code Status: DNR   Disposition Plan: TBD   Consultants:   Palliative Care  Antimicrobials:   Cefepime, Vanc    Subjective: "I'm not  quite done yet."  Objective: Vitals:   05/27/19 1230 05/27/19 1300 05/27/19 1400 05/27/19 1500  BP:  136/68 (!) 135/57 (!) 142/64  Pulse: 89 89 (!) 109 94  Resp: (!) 23 (!) 21 20 (!) 21  Temp:      TempSrc:      SpO2: 100% 97% 96% 97%  Weight:      Height:        Intake/Output Summary (Last 24 hours) at 05/27/2019 1522 Last data filed at 05/27/2019 1500 Gross per 24 hour  Intake 2712.09 ml  Output 650 ml  Net 2062.09 ml   Filed Weights   05/26/19 2306  Weight: 90.7 kg    Examination:  General: 73 y.o. male ill appear resting on side of bed Cardiovascular: RRR, +S1, S2, no m/g/r, equal pulses throughout Respiratory: tight, increased WOB, breathless w/ speech GI: BS+, NDNT, no masses noted, no organomegaly noted MSK: No e/c/c Neuro: A&O x 3, no focal deficits   Data Reviewed: I have personally reviewed following labs and imaging studies.  CBC: Recent Labs  Lab 05/26/19 2321  WBC 10.7*  NEUTROABS 9.7*  HGB 7.6*  HCT 25.3*  MCV 100.0  PLT 474   Basic Metabolic Panel: Recent Labs  Lab 05/26/19 2321  NA 136  K 3.5  CL 101  CO2 24  GLUCOSE 148*  BUN 22  CREATININE 1.00  CALCIUM 8.3*   GFR: Estimated Creatinine Clearance: 68.4 mL/min (by C-G formula based on SCr of 1 mg/dL). Liver Function Tests: Recent Labs  Lab 05/26/19 2321  AST 59*  ALT 47*  ALKPHOS 119  BILITOT 4.4*  PROT 6.8  ALBUMIN 3.3*   Recent Labs  Lab 05/26/19 2321  LIPASE 31   No results for input(s): AMMONIA in the last 168 hours. Coagulation Profile: Recent Labs  Lab 05/26/19 2321  INR 1.2   Cardiac Enzymes: No results for input(s): CKTOTAL, CKMB, CKMBINDEX, TROPONINI in the last 168 hours. BNP (last 3 results) No results for input(s): PROBNP in the last 8760 hours. HbA1C: No results for input(s): HGBA1C in the last 72 hours. CBG: No results for input(s): GLUCAP in the last 168 hours. Lipid Profile: Recent Labs    05/26/19 2329  TRIG 61   Thyroid Function  Tests: No results for input(s): TSH, T4TOTAL, FREET4, T3FREE, THYROIDAB in the  last 72 hours. Anemia Panel: Recent Labs    05/27/19 0130 05/27/19 0134 05/27/19 0154  VITAMINB12  --  432  --   FOLATE  --  24.5  --   FERRITIN  --   --  868*  TIBC  --  220*  --   IRON  --  20*  --   RETICCTPCT 5.7*  --   --    Sepsis Labs: Recent Labs  Lab 05/26/19 2321 05/27/19 0121 05/27/19 1216  PROCALCITON 4.29  --   --   LATICACIDVEN 2.8* 2.6* 3.2*    Recent Results (from the past 240 hour(s))  SARS Coronavirus 2 (CEPHEID- Performed in [place] hospital lab), Hosp Order     Status: None   Collection Time: 05/26/19 11:28 PM   Specimen: Nasopharyngeal Swab  Result Value Ref Range Status   SARS Coronavirus 2 NEGATIVE NEGATIVE Final    Comment: (NOTE) If result is NEGATIVE SARS-CoV-2 target nucleic acids are NOT DETECTED. The SARS-CoV-2 RNA is generally detectable in upper and lower  respiratory specimens during the acute phase of infection. The lowest  concentration of SARS-CoV-2 viral copies this assay can detect is 250  copies / mL. A negative result does not preclude SARS-CoV-2 infection  and should not be used as the sole basis for treatment or other  patient management decisions.  A negative result may occur with  improper specimen collection / handling, submission of specimen other  than nasopharyngeal swab, presence of viral mutation(s) within the  areas targeted by this assay, and inadequate number of viral copies  (<250 copies / mL). A negative result must be combined with clinical  observations, patient history, and epidemiological information. If result is POSITIVE SARS-CoV-2 target nucleic acids are DETECTED. The SARS-CoV-2 RNA is generally detectable in upper and lower  respiratory specimens dur ing the acute phase of infection.  Positive  results are indicative of active infection with SARS-CoV-2.  Clinical  correlation with patient history and other diagnostic  information is  necessary to determine patient infection status.  Positive results do  not rule out bacterial infection or co-infection with other viruses. If result is PRESUMPTIVE POSTIVE SARS-CoV-2 nucleic acids MAY BE PRESENT.   A presumptive positive result was obtained on the submitted specimen  and confirmed on repeat testing.  While 2019 novel coronavirus  (SARS-CoV-2) nucleic acids may be present in the submitted sample  additional confirmatory testing may be necessary for epidemiological  and / or clinical management purposes  to differentiate between  SARS-CoV-2 and other Sarbecovirus currently known to infect humans.  If clinically indicated additional testing with an alternate test  methodology [phone]) is advised. The SARS-CoV-2 RNA is generally  detectable in upper and lower respiratory sp ecimens during the acute  phase of infection. The expected result is Negative. Fact Sheet for Patients:  StrictlyIdeas.no Fact Sheet for Healthcare Providers: BankingDealers.co.za This test is not yet approved or cleared by the [place] FDA and has been authorized for detection and/or diagnosis of SARS-CoV-2 by FDA under an Emergency Use Authorization (EUA).  This EUA will remain in effect (meaning this test can be used) for the duration of the COVID-19 declaration under Section 564(b)(1) of the Act, 21 U.S.C. section 360bbb-3(b)(1), unless the authorization is terminated or revoked sooner. Performed at [facility], [place] [address], [place], [place] [address]   Blood Culture (routine x 2)     Status: None (Preliminary result)   Collection Time: 05/26/19 11:29 PM  Specimen: BLOOD  Result Value Ref Range Status   Specimen Description BLOOD RIGHT ANTECUBITAL  Final   Special Requests   Final    BOTTLES DRAWN AEROBIC AND ANAEROBIC Blood Culture adequate volume   Culture  Setup Time   Final    GRAM POSITIVE  RODS GRAM NEGATIVE RODS IN BOTH AEROBIC AND ANAEROBIC BOTTLES CRITICAL VALUE NOTED.  VALUE IS CONSISTENT WITH PREVIOUSLY REPORTED AND CALLED VALUE. Performed at [facility], [address], [place], [place] [address]    Culture Lonell Grandchild NEGATIVE RODS GRAM POSITIVE RODS   Final   Report Status PENDING  Incomplete  Blood Culture (routine x 2)     Status: None (Preliminary result)   Collection Time: 05/26/19 11:34 PM   Specimen: BLOOD  Result Value Ref Range Status   Specimen Description BLOOD LEFT ANTECUBITAL  Final   Special Requests   Final    BOTTLES DRAWN AEROBIC AND ANAEROBIC Blood Culture adequate volume   Culture  Setup Time   Final    GRAM POSITIVE RODS GRAM NEGATIVE RODS IN BOTH AEROBIC AND ANAEROBIC BOTTLES CRITICAL RESULT CALLED TO, READ BACK BY AND VERIFIED WITH: PHARMD A. PHAM 1420 [id number] FCP Performed at [place] [address] [place], [place] [address]    Culture Lonell Grandchild NEGATIVE RODS GRAM POSITIVE RODS   Final   Report Status PENDING  Incomplete  Blood Culture ID Panel (Reflexed)     Status: Abnormal   Collection Time: 05/26/19 11:34 PM  Result Value Ref Range Status   Enterococcus species NOT DETECTED NOT DETECTED Final   Listeria monocytogenes NOT DETECTED NOT DETECTED Final   Staphylococcus species NOT DETECTED NOT DETECTED Final   Staphylococcus aureus (BCID) NOT DETECTED NOT DETECTED Final   Streptococcus species NOT DETECTED NOT DETECTED Final   Streptococcus agalactiae NOT DETECTED NOT DETECTED Final   Streptococcus pneumoniae NOT DETECTED NOT DETECTED Final   Streptococcus pyogenes NOT DETECTED NOT DETECTED Final   Acinetobacter baumannii NOT DETECTED NOT DETECTED Final   Enterobacteriaceae species DETECTED (A) NOT DETECTED Final    Comment: Enterobacteriaceae represent a large family of gram-negative bacteria, not a single organism. CRITICAL RESULT CALLED TO, READ BACK BY AND VERIFIED WITH: PHARMD A. PHAM 1420 [id number] FCP    Enterobacter  cloacae complex NOT DETECTED NOT DETECTED Final   Escherichia coli DETECTED (A) NOT DETECTED Final    Comment: CRITICAL RESULT CALLED TO, READ BACK BY AND VERIFIED WITH: PHARMD A. PHAM 1420 [id number] FCP    Klebsiella oxytoca NOT DETECTED NOT DETECTED Final   Klebsiella pneumoniae NOT DETECTED NOT DETECTED Final   Proteus species NOT DETECTED NOT DETECTED Final   Serratia marcescens NOT DETECTED NOT DETECTED Final   Carbapenem resistance NOT DETECTED NOT DETECTED Final   Haemophilus influenzae NOT DETECTED NOT DETECTED Final   Neisseria meningitidis NOT DETECTED NOT DETECTED Final   Pseudomonas aeruginosa NOT DETECTED NOT DETECTED Final   Candida albicans NOT DETECTED NOT DETECTED Final   Candida glabrata NOT DETECTED NOT DETECTED Final   Candida krusei NOT DETECTED NOT DETECTED Final   Candida parapsilosis NOT DETECTED NOT DETECTED Final   Candida tropicalis NOT DETECTED NOT DETECTED Final    Comment: Performed at [facility], [place] [address], [place], [place] [address]  MRSA PCR Screening     Status: None   Collection Time: 05/27/19  5:29 AM   Specimen: Nasal Mucosa; Nasopharyngeal  Result Value Ref Range Status   MRSA by PCR NEGATIVE NEGATIVE  Final    Comment:        The GeneXpert MRSA Assay (FDA approved for NASAL specimens only), is one component of a comprehensive MRSA colonization surveillance program. It is not intended to diagnose MRSA infection nor to guide or monitor treatment for MRSA infections. Performed at [facility], [place] [address] [place], [place] [address]          Radiology Studies: Ct Angio Chest Pe W And/or Wo Contrast  Result Date: 05/27/2019 CLINICAL DATA:  Shortness of breath, weakness, fever. 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 EXAM: CT ANGIOGRAPHY CHEST CT ABDOMEN AND PELVIS WITH CONTRAST TECHNIQUE: Multidetector CT imaging of the chest was performed using the standard protocol  during bolus administration of intravenous contrast. Multiplanar CT image reconstructions and MIPs were obtained to evaluate the vascular anatomy. Multidetector CT imaging of the abdomen and pelvis was performed using the standard protocol during bolus administration of intravenous contrast. CONTRAST:  163mL OMNIPAQUE IOHEXOL 350 MG/ML SOLN COMPARISON:  03/27/2019. FINDINGS: CTA CHEST FINDINGS Cardiovascular: Motion degraded images. Within that constraint, there is satisfactory opacification of the bilateral pulmonary arteries to the lobar level. No evidence of pulmonary embolism. No evidence of thoracic aortic aneurysm. Atherosclerotic calcifications of the aortic arch. Heart is normal in size.  No pericardial effusion. Three vessel coronary atherosclerosis. Mediastinum/Nodes: No suspicious mediastinal lymphadenopathy. Visualized thyroid is unremarkable. Lungs/Pleura: Bilateral pulmonary nodules/metastases, motion degraded but favored to be mildly progressive, including: --1.9 cm right upper lobe nodule (series 12/image 53), previously 1.5 cm --2.4 cm superior segment left lower lobe nodule (series 12/image 53), previously 1.8 cm --3.5 cm central right lower lobe mass (series 12/image 109), previously 2.6 cm Extensive centrilobular and paraseptal emphysematous changes, upper lung predominant. No focal consolidation. No pleural effusion or pneumothorax. Musculoskeletal: Degenerative changes of the thoracic spine. Review of the MIP images confirms the above findings. CT ABDOMEN and PELVIS FINDINGS Motion degraded images. Hepatobiliary: Embolization coils within a branch of the distal left hepatic artery. Partially calcified treated left hepatic lesion in segment 2 measures 5.2 x 3.7 cm, previously 4.8 x 3.7 cm, grossly unchanged. 3.5 x 2.9 cm hypodense lesion in segment 4A (series 4/image 11), previously 3.8 x 2.8 cm, unchanged. Additional 14 mm lesion inferiorly in the right hepatic lobe (series 4/image 27), similar  to the prior. Gallbladder is unremarkable. No intrahepatic or extrahepatic ductal dilatation. Pancreas: Within normal limits. Spleen: Within normal limits. Adrenals/Urinary Tract: Adrenal glands are within normal limits. 6.8 cm right upper pole renal cyst. Left kidney is within normal limits. No hydronephrosis. Bladder is underdistended but unremarkable. Stomach/Bowel: Stomach is within normal limits. No evidence of bowel obstruction. Normal appendix (series 4/image 46). Left colon is decompressed. Mild sigmoid diverticulosis, without evidence of diverticulitis. Vascular/Lymphatic: No evidence of abdominal aortic aneurysm. Atherosclerotic calcifications of the abdominal aorta and branch vessels. No suspicious abdominopelvic lymphadenopathy. Reproductive: Prostate is unremarkable. Other: 16.7 x 26.0 x 18.7 cm mass in the right mid abdomen, previously 16.0 x 24.0 x 18.5 cm, mildly increased (calculated volume = 4250 mL versus 3720 mL on the prior). Interval development of necrosis with gas along the left superior aspect of the mass (series 4/image 45). No abdominopelvic ascites. Musculoskeletal: Degenerative changes of the visualized thoracolumbar spine, most prominent at L5-S1. Review of the MIP images confirms the above findings. IMPRESSION: No evidence of pulmonary embolism. 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 Status post chemoembolization. Hepatic metastases are  grossly unchanged. Progressive pulmonary metastases, as described above, although poorly evaluated due to motion degradation. Aortic Atherosclerosis (ICD10-I70.0) and Emphysema (ICD10-J43.9). Electronically Signed   By: Julian Hy M.D.   On: 05/27/2019 03:48   Ct Abdomen Pelvis W Contrast  Result Date: 05/27/2019 CLINICAL DATA:  Shortness of breath, weakness, fever. 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 EXAM: CT ANGIOGRAPHY CHEST CT  ABDOMEN AND PELVIS WITH CONTRAST TECHNIQUE: Multidetector CT imaging of the chest was performed using the standard protocol during bolus administration of intravenous contrast. Multiplanar CT image reconstructions and MIPs were obtained to evaluate the vascular anatomy. Multidetector CT imaging of the abdomen and pelvis was performed using the standard protocol during bolus administration of intravenous contrast. CONTRAST:  168mL OMNIPAQUE IOHEXOL 350 MG/ML SOLN COMPARISON:  03/27/2019. FINDINGS: CTA CHEST FINDINGS Cardiovascular: Motion degraded images. Within that constraint, there is satisfactory opacification of the bilateral pulmonary arteries to the lobar level. No evidence of pulmonary embolism. No evidence of thoracic aortic aneurysm. Atherosclerotic calcifications of the aortic arch. Heart is normal in size.  No pericardial effusion. Three vessel coronary atherosclerosis. Mediastinum/Nodes: No suspicious mediastinal lymphadenopathy. Visualized thyroid is unremarkable. Lungs/Pleura: Bilateral pulmonary nodules/metastases, motion degraded but favored to be mildly progressive, including: --1.9 cm right upper lobe nodule (series 12/image 53), previously 1.5 cm --2.4 cm superior segment left lower lobe nodule (series 12/image 53), previously 1.8 cm --3.5 cm central right lower lobe mass (series 12/image 109), previously 2.6 cm Extensive centrilobular and paraseptal emphysematous changes, upper lung predominant. No focal consolidation. No pleural effusion or pneumothorax. Musculoskeletal: Degenerative changes of the thoracic spine. Review of the MIP images confirms the above findings. CT ABDOMEN and PELVIS FINDINGS Motion degraded images. Hepatobiliary: Embolization coils within a branch of the distal left hepatic artery. Partially calcified treated left hepatic lesion in segment 2 measures 5.2 x 3.7 cm, previously 4.8 x 3.7 cm, grossly unchanged. 3.5 x 2.9 cm hypodense lesion in segment 4A (series 4/image 11),  previously 3.8 x 2.8 cm, unchanged. Additional 14 mm lesion inferiorly in the right hepatic lobe (series 4/image 27), similar to the prior. Gallbladder is unremarkable. No intrahepatic or extrahepatic ductal dilatation. Pancreas: Within normal limits. Spleen: Within normal limits. Adrenals/Urinary Tract: Adrenal glands are within normal limits. 6.8 cm right upper pole renal cyst. Left kidney is within normal limits. No hydronephrosis. Bladder is underdistended but unremarkable. Stomach/Bowel: Stomach is within normal limits. No evidence of bowel obstruction. Normal appendix (series 4/image 46). Left colon is decompressed. Mild sigmoid diverticulosis, without evidence of diverticulitis. Vascular/Lymphatic: No evidence of abdominal aortic aneurysm. Atherosclerotic calcifications of the abdominal aorta and branch vessels. No suspicious abdominopelvic lymphadenopathy. Reproductive: Prostate is unremarkable. Other: 16.7 x 26.0 x 18.7 cm mass in the right mid abdomen, previously 16.0 x 24.0 x 18.5 cm, mildly increased (calculated volume = 4250 mL versus 3720 mL on the prior). Interval development of necrosis with gas along the left superior aspect of the mass (series 4/image 45). No abdominopelvic ascites. Musculoskeletal: Degenerative changes of the visualized thoracolumbar spine, most prominent at L5-S1. Review of the MIP images confirms the above findings. IMPRESSION: No evidence of pulmonary embolism. 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 Status post chemoembolization. Hepatic metastases are grossly unchanged. Progressive pulmonary metastases, as described above, although poorly evaluated due to motion degradation. Aortic Atherosclerosis (ICD10-I70.0) and Emphysema (ICD10-J43.9). Electronically Signed   By: Julian Hy M.D.   On: 05/27/2019 03:48   Dg Chest  Port 1 View  Result Date: 05/26/2019 CLINICAL DATA:  Shortness of breath and fever for  several hours EXAM: PORTABLE CHEST 1 VIEW COMPARISON:  05/06/2019 FINDINGS: Cardiac shadow is stable. Aortic calcifications are seen. The lungs are well aerated bilaterally with emphysematous changes and crowding of the markings in the bases. Multiple bilateral pulmonary nodules are seen. Slight increased density in the bases is noted when compare with the prior exam likely representing some superimposed atelectatic change. IMPRESSION: Multifocal pulmonary nodules consistent with metastatic disease. Mild increased atelectasis in the bases superimposed over changes of COPD. Electronically Signed   By: Inez Catalina M.D.   On: 05/26/2019 23:46   Vas [place] Lower Extremity Venous (dvt)  Result Date: 05/27/2019  Lower Venous Study Indications: Edema.  Limitations: Poor ultrasound/tissue interface. Comparison Study: no prior Performing Technologist: June Leap RDMS, RVT  Examination Guidelines: A complete evaluation includes B-mode imaging, spectral Doppler, color Doppler, and power Doppler as needed of all accessible portions of each vessel. Bilateral testing is considered an integral part of a complete examination. Limited examinations for reoccurring indications may be performed as noted.  +---------+---------------+---------+-----------+----------+--------------+  RIGHT     Compressibility Phasicity Spontaneity Properties Summary         +---------+---------------+---------+-----------+----------+--------------+  CFV       Full            Yes       Yes                                    +---------+---------------+---------+-----------+----------+--------------+  SFJ       Full                                                             +---------+---------------+---------+-----------+----------+--------------+  FV Prox   Full                                                             +---------+---------------+---------+-----------+----------+--------------+  FV Mid    Full                                                              +---------+---------------+---------+-----------+----------+--------------+  FV Distal Full                                                             +---------+---------------+---------+-----------+----------+--------------+  PFV       Full                                                             +---------+---------------+---------+-----------+----------+--------------+  POP       Full            Yes       Yes                                    +---------+---------------+---------+-----------+----------+--------------+  PTV       Full                                                             +---------+---------------+---------+-----------+----------+--------------+  PERO                                                       Not visualized  +---------+---------------+---------+-----------+----------+--------------+   +---------+---------------+---------+-----------+----------+-------+  LEFT      Compressibility Phasicity Spontaneity Properties Summary  +---------+---------------+---------+-----------+----------+-------+  CFV       Full            Yes       Yes                             +---------+---------------+---------+-----------+----------+-------+  SFJ       Full                                                      +---------+---------------+---------+-----------+----------+-------+  FV Prox   Full                                                      +---------+---------------+---------+-----------+----------+-------+  FV Mid    Full                                                      +---------+---------------+---------+-----------+----------+-------+  FV Distal Full                                                      +---------+---------------+---------+-----------+----------+-------+  PFV       Full                                                      +---------+---------------+---------+-----------+----------+-------+  POP       Full            Yes       Yes                              +---------+---------------+---------+-----------+----------+-------+  PTV       Full                                                      +---------+---------------+---------+-----------+----------+-------+  PERO      Full                                                      +---------+---------------+---------+-----------+----------+-------+     Summary: Right: There is no evidence of deep vein thrombosis in the lower extremity. No cystic structure found in the popliteal fossa. Left: There is no evidence of deep vein thrombosis in the lower extremity. No cystic structure found in the popliteal fossa.  *See table(s) above for measurements and observations.    Preliminary     Scheduled Meds:  ALPRAZolam  0.5 mg Oral QHS   budesonide  0.5 mg Nebulization BID   cholecalciferol  400 Units Oral QHS   ipratropium-albuterol  3 mL Nebulization Q4H   methocarbamol  1,000 mg Oral QHS   methylPREDNISolone (SOLU-MEDROL) injection  60 mg Intravenous TID   multivitamin  1 tablet Oral BID   pantoprazole  40 mg Oral Daily   simvastatin  20 mg Oral q1800   vitamin B-12  1,000 mcg Oral Daily   vitamin C  1,000 mg Oral Daily   Continuous Infusions:  ceFEPime (MAXIPIME) IV Stopped (05/27/19 0940)   [START ON 05/28/2019] vancomycin       LOS: 0 days    Time spent: 60 minutes spent in the coordination of care today.    Jonnie Finner, DO [facility] Pager [phone]  If 7PM-7AM, please contact night-coverage [email or web address] Password [facility] 05/27/2019, 3:22 PM

## 2019-05-27 NOTE — Progress Notes (Addendum)
PHARMACY - PHYSICIAN COMMUNICATION CRITICAL VALUE ALERT - BLOOD CULTURE IDENTIFICATION (BCID)  [name] is an 73 y.o. male who presented to [facility] on 05/26/2019 with a chief complaint of SOB and fever  Assessment:  4/4 BCx bottles with E coli and unknown GPR (source unknown; suspect pulm vs urinary). Chart review does not reveal any history of ESBL.  Name of physician (or Provider) Contacted: Marylyn Ishihara  Current antibiotics: Vanc, cefepime  Changes to prescribed antibiotics recommended: Narrow cefepime to Rocephin 2g IV q24 hr; leave vanc with GPR Recommendations accepted by provider  Results for orders placed or performed during the hospital encounter of 05/26/19  Blood Culture ID Panel (Reflexed) (Collected: 05/26/2019 11:34 PM)  Result Value Ref Range   Enterococcus species NOT DETECTED NOT DETECTED   Listeria monocytogenes NOT DETECTED NOT DETECTED   Staphylococcus species NOT DETECTED NOT DETECTED   Staphylococcus aureus (BCID) NOT DETECTED NOT DETECTED   Streptococcus species NOT DETECTED NOT DETECTED   Streptococcus agalactiae NOT DETECTED NOT DETECTED   Streptococcus pneumoniae NOT DETECTED NOT DETECTED   Streptococcus pyogenes NOT DETECTED NOT DETECTED   Acinetobacter baumannii NOT DETECTED NOT DETECTED   Enterobacteriaceae species DETECTED (A) NOT DETECTED   Enterobacter cloacae complex NOT DETECTED NOT DETECTED   Escherichia coli DETECTED (A) NOT DETECTED   Klebsiella oxytoca NOT DETECTED NOT DETECTED   Klebsiella pneumoniae NOT DETECTED NOT DETECTED   Proteus species NOT DETECTED NOT DETECTED   Serratia marcescens NOT DETECTED NOT DETECTED   Carbapenem resistance NOT DETECTED NOT DETECTED   Haemophilus influenzae NOT DETECTED NOT DETECTED   Neisseria meningitidis NOT DETECTED NOT DETECTED   Pseudomonas aeruginosa NOT DETECTED NOT DETECTED   Candida albicans NOT DETECTED NOT DETECTED   Candida glabrata NOT DETECTED NOT DETECTED   Candida krusei NOT DETECTED NOT  DETECTED   Candida parapsilosis NOT DETECTED NOT DETECTED   Candida tropicalis NOT DETECTED NOT DETECTED    [name] A 05/27/2019  4:01 PM

## 2019-05-28 LAB — LIPID PANEL
Cholesterol: 106 mg/dL (ref 0–200)
HDL: 23 mg/dL — ABNORMAL LOW (ref >40)
LDL Cholesterol: 66 mg/dL (ref 0–99)
Total CHOL/HDL Ratio: 4.6 RATIO
Triglycerides: 83 mg/dL (ref <150)
VLDL: 17 mg/dL (ref 0–40)

## 2019-05-28 LAB — RESPIRATORY PANEL BY PCR

## 2019-05-28 LAB — CBC WITH DIFFERENTIAL/PLATELET
Abs Immature Granulocytes: 0.3 10*3/uL — ABNORMAL HIGH (ref 0.00–0.07)
Basophils Absolute: 0 10*3/uL (ref 0.0–0.1)
Basophils Relative: 0 %
Eosinophils Absolute: 0 10*3/uL (ref 0.0–0.5)
Eosinophils Relative: 0 %
HCT: 21.8 % — ABNORMAL LOW (ref 39.0–52.0)
Hemoglobin: 6.7 g/dL — CL (ref 13.0–17.0)
Immature Granulocytes: 2 %
Lymphocytes Relative: 3 %
Lymphs Abs: 0.4 10*3/uL — ABNORMAL LOW (ref 0.7–4.0)
MCH: 30.3 pg (ref 26.0–34.0)
MCHC: 30.7 g/dL (ref 30.0–36.0)
MCV: 98.6 fL (ref 80.0–100.0)
Monocytes Absolute: 0.7 10*3/uL (ref 0.1–1.0)
Monocytes Relative: 6 %
Neutro Abs: 11.6 10*3/uL — ABNORMAL HIGH (ref 1.7–7.7)
Neutrophils Relative %: 89 %
Platelets: 137 10*3/uL — ABNORMAL LOW (ref 150–400)
RBC: 2.21 MIL/uL — ABNORMAL LOW (ref 4.22–5.81)
RDW: 18.7 % — ABNORMAL HIGH (ref 11.5–15.5)
WBC: 13 10*3/uL — ABNORMAL HIGH (ref 4.0–10.5)
nRBC: 0 % (ref 0.0–0.2)

## 2019-05-28 LAB — RENAL FUNCTION PANEL
Albumin: 3.1 g/dL — ABNORMAL LOW (ref 3.5–5.0)
Anion gap: 11 (ref 5–15)
BUN: 30 mg/dL — ABNORMAL HIGH (ref 8–23)
CO2: 22 mmol/L (ref 22–32)
Calcium: 8 mg/dL — ABNORMAL LOW (ref 8.9–10.3)
Chloride: 102 mmol/L (ref 98–111)
Creatinine, Ser: 0.82 mg/dL (ref 0.61–1.24)
GFR calc Af Amer: >60 mL/min (ref >60)
GFR calc non Af Amer: >60 mL/min (ref >60)
Glucose, Bld: 183 mg/dL — ABNORMAL HIGH (ref 70–99)
Phosphorus: 3.2 mg/dL (ref 2.5–4.6)
Potassium: 4.3 mmol/L (ref 3.5–5.1)
Sodium: 135 mmol/L (ref 135–145)

## 2019-05-28 LAB — LACTIC ACID, PLASMA
Lactic Acid, Venous: 3.2 mmol/L (ref 0.5–1.9)
Lactic Acid, Venous: 2.8 mmol/L (ref 0.5–1.9)

## 2019-05-28 LAB — INFLUENZA PANEL BY PCR (TYPE A & B)
Influenza A By PCR: NEGATIVE
Influenza B By PCR: NEGATIVE

## 2019-05-28 LAB — HEMOGLOBIN AND HEMATOCRIT, BLOOD
HCT: 27.7 % — ABNORMAL LOW (ref 39.0–52.0)
Hemoglobin: 8.7 g/dL — ABNORMAL LOW (ref 13.0–17.0)

## 2019-05-28 LAB — URINE CULTURE: Culture: NO GROWTH

## 2019-05-28 LAB — LEGIONELLA PNEUMOPHILA SEROGP 1 UR AG: L. pneumophila Serogp 1 Ur Ag: NEGATIVE

## 2019-05-28 LAB — HIV ANTIBODY (ROUTINE TESTING W REFLEX): HIV Screen 4th Generation wRfx: NONREACTIVE

## 2019-05-28 LAB — MAGNESIUM: Magnesium: 2 mg/dL (ref 1.7–2.4)

## 2019-05-28 LAB — PREPARE RBC (CROSSMATCH)

## 2019-05-28 MED ORDER — SODIUM CHLORIDE 0.9% IV SOLUTION
Freq: Once | INTRAVENOUS | Status: AC
  Administered 2019-05-28: 13:00:00 via INTRAVENOUS

## 2019-05-28 MED ORDER — METOPROLOL TARTRATE 5 MG/5ML IV SOLN
5.0000 mg | Freq: Once | INTRAVENOUS | Status: AC
  Administered 2019-05-28: 11:00:00 5 mg via INTRAVENOUS

## 2019-05-28 MED ORDER — METOPROLOL TARTRATE 5 MG/5ML IV SOLN
INTRAVENOUS | Status: AC
  Administered 2019-05-28: 5 mg via INTRAVENOUS
  Filled 2019-05-28: qty 5

## 2019-05-28 MED ORDER — METOPROLOL TARTRATE 25 MG PO TABS
25.0000 mg | ORAL_TABLET | Freq: Two times a day (BID) | ORAL | Status: DC
  Administered 2019-05-28 – 2019-06-02 (×10): 25 mg via ORAL
  Filled 2019-05-28 (×11): qty 1

## 2019-05-28 NOTE — Progress Notes (Signed)
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  Patient states that he started having worsening shortness of breath, fever and chills since this afternoon. He had temperature 102 at home. He has dry cough, denies chest pain, runny nose or sore throat. Per report, he had oxygen desaturation to 70s% on nonrebreather, which improved to 98% on BiPAP. Patient denies nausea,vomiting, diarrhea, abdominal pain, symptoms of UTI. He denies dark stool or rectal bleeding currently. Of note, patient has history of GI bleeding, andgetting frequent blood transfusion. He refused GI evaluation recently.   Assessment & Plan:   Principal Problem:   Acute on chronic respiratory failure with hypoxia and hypercapnia (HCC) Active Problems:   Malignant neoplasm of bladder (HCC)   HYPERCHOLESTEROLEMIA   Anxiety   Essential hypertension   Macrocytic anemia   COPD with acute exacerbation (HCC)   Chronic diastolic CHF (congestive heart failure) (HCC)   DNR (do not resuscitate)   Elevated troponin   OSA (obstructive sleep apnea)   Sepsis (HCC)   COPD exacerbation (HCC)   Shortness of breath   Acute on chronic respiratory failure with hypoxia and hypercapnia due to COPD exacerbation:     - COVID testing negative     - elevated d-dimer; CTA negative for PE     - ABG: ph 7.391, pCO2 41, pO2 157     - vanc/rocephin     - Inhaler: Atrovent inhaler, prn  Xopenex inhaler     - Solu-Medrol 60 mg IV tid     - Mucinex for cough      - Incentive spirometry     - Urine S. pneumococcal antigen, resp  viral panel, sputum Cx all pending     - f/u LE to r/o DVT   Sepsis from e coli bacteremia/GPR bacteremia     - source unclear, but likely necrotic tumor     - leukocytosis, fever, tachycardia, tachypnea     - BP responsive to fluids     - CXR w/ metastatic disease/COPD     - Bld Cx +GPR/e. coli x 2     - procal: 4     - lactic acid: 2.8 -> 2.6 -> 3.2 -> 5.4 -> 3.2 -> 2.8     - Hx of HF, giving fluids, but must monitor  Malignant neoplasm of bladder     - CTA showed an enlarged tumor size, hepatic metastases, progressive pulmonary metastasis.       - Patient is s/p radiation therapy, no surgery and chemotherapy per patient.     - Palliative consult: "DNR DNI. Home with continuation of home based palliative care. Patient doesn't want hospice, doesn't want to talk about hospice services.  Continue current mode of care."   HLD     - zocor  Anxiety:     - xanax  Essential hypertension:      - Blood pressures are soft     - resume metoprolol     - hold lasix due to sepsis  Macrocytic anemia:      - Hemoglobin 7.6 (7.8 on 05/07/2019, 8.3 on 05/06/2019).       -  Denies rectal bleeding or dark stool.      - Pt had symptomatic anemia which required 2 units pRBC transfusion. Pt has possible GI loss, but he denies declined evaluation by surgery or GI     - 1 unit of blood was ordered by ED physician.     - his plan is for intermittent transfusions going forward as needed     - Hgb 6.7 this AM. Transfuse 1 unit pRBCs  Chronic diastolic CHF:      - BNP 921, CXR did not show pulmonary edema.     - Hold Lasix due to sepsis  OSA:     - on BiPAP  Elevated troponin:      - Troponins are minimally elevated, 17/20.       - Denies any chest pain most likely due to demand ischemia secondary to sepsis and hypoxia.     - No aspirin due to possible GI bleeding     - Continue Zocor     - PRN nitroglycerin  DVT prophylaxis: SCD Code Status: DNR   Disposition Plan:  TBD   Consultants:   Palliative Care  Antimicrobials:   Rocephin, Vanc    Subjective: "I appreciate you doing everything."  Objective: Vitals:   05/28/19 1254 05/28/19 1257 05/28/19 1300 05/28/19 1312  BP: (!) 143/92 (!) 143/92 (!) 149/63 130/64  Pulse:  (!) 109 (!) 110 (!) 105  Resp:  (!) 26 (!) 24 (!) 26  Temp: 98.6 F (37 C)   99.5 F (37.5 C)  TempSrc: Oral   Oral  SpO2:   95% 97%  Weight:      Height:        Intake/Output Summary (Last 24 hours) at 05/28/2019 1405 Last data filed at 05/28/2019 1300 Gross per 24 hour  Intake 1357.46 ml  Output 850 ml  Net 507.46 ml   Filed Weights   05/26/19 2306 05/28/19 0627  Weight: 90.7 kg 92.7 kg    Examination:  General: 73 y.o. male ill appear resting on side of bed Cardiovascular: RRR, +S1, S2, no m/g/r, equal pulses throughout Respiratory: tight, increased WOB, breathless w/ speech GI: BS+, NDNT, no masses noted, no organomegaly noted MSK: No c/c. B/l pedal edema Neuro: A&O x 3, no focal deficits    Data Reviewed: I have personally reviewed following labs and imaging studies.  CBC: Recent Labs  Lab 05/26/19 2321 05/28/19 0239  WBC 10.7* 13.0*  NEUTROABS 9.7* 11.6*  HGB 7.6* 6.7*  HCT 25.3* 21.8*  MCV 100.0 98.6  PLT 199 194*   Basic Metabolic Panel: Recent Labs  Lab 05/26/19 2321 05/28/19 0239  NA 136 135  K 3.5 4.3  CL 101 102  CO2 24 22  GLUCOSE 148* 183*  BUN 22 30*  CREATININE 1.00 0.82  CALCIUM 8.3* 8.0*  MG  --  2.0  PHOS  --  3.2   GFR: Estimated Creatinine Clearance: 84.3 mL/min (by C-G formula based on SCr of 0.82 mg/dL). Liver Function Tests: Recent Labs  Lab 05/26/19 2321 05/28/19 0239  AST 59*  --   ALT 47*  --   ALKPHOS 119  --   BILITOT 4.4*  --   PROT 6.8  --   ALBUMIN 3.3* 3.1*   Recent Labs  Lab 05/26/19 2321  LIPASE 31   No results for input(s): AMMONIA in the last 168 hours. Coagulation Profile: Recent Labs  Lab 05/26/19 2321  INR 1.2    Cardiac Enzymes: No results for  input(s): CKTOTAL, CKMB, CKMBINDEX, TROPONINI in the last 168 hours. BNP (last 3 results) No results for input(s): PROBNP in the last 8760 hours. HbA1C: Recent Labs    05/27/19 1216  HGBA1C 5.6   CBG: No results for input(s): GLUCAP in the last 168 hours. Lipid Profile: Recent Labs    05/26/19 2329 05/28/19 0237  CHOL  --  106  HDL  --  23*  LDLCALC  --  66  TRIG 61 83  CHOLHDL  --  4.6   Thyroid Function Tests: No results for input(s): TSH, T4TOTAL, FREET4, T3FREE, THYROIDAB in the last 72 hours. Anemia Panel: Recent Labs    05/27/19 0130 05/27/19 0134 05/27/19 0154  VITAMINB12  --  432  --   FOLATE  --  24.5  --   FERRITIN  --   --  868*  TIBC  --  220*  --   IRON  --  20*  --   RETICCTPCT 5.7*  --   --    Sepsis Labs: Recent Labs  Lab 05/26/19 2321  05/27/19 1216 05/27/19 1446 05/28/19 1009 05/28/19 1249  PROCALCITON 4.29  --   --   --   --   --   LATICACIDVEN 2.8*   < > 3.2* 5.4* 3.2* 2.8*   < > = values in this interval not displayed.    Recent Results (from the past 240 hour(s))  SARS Coronavirus 2 (CEPHEID- Performed in [place] hospital lab), Hosp Order     Status: None   Collection Time: 05/26/19 11:28 PM   Specimen: Nasopharyngeal Swab  Result Value Ref Range Status   SARS Coronavirus 2 NEGATIVE NEGATIVE Final    Comment: (NOTE) If result is NEGATIVE SARS-CoV-2 target nucleic acids are NOT DETECTED. The SARS-CoV-2 RNA is generally detectable in upper and lower  respiratory specimens during the acute phase of infection. The lowest  concentration of SARS-CoV-2 viral copies this assay can detect is 250  copies / mL. A negative result does not preclude SARS-CoV-2 infection  and should not be used as the sole basis for treatment or other  patient management decisions.  A negative result may occur with  improper specimen collection / handling, submission of specimen other  than nasopharyngeal swab, presence  of viral mutation(s) within the  areas targeted by this assay, and inadequate number of viral copies  (<250 copies / mL). A negative result must be combined with clinical  observations, patient history, and epidemiological information. If result is POSITIVE SARS-CoV-2 target nucleic acids are DETECTED. The SARS-CoV-2 RNA is generally detectable in upper and lower  respiratory specimens dur ing the acute phase of infection.  Positive  results are indicative of active infection with SARS-CoV-2.  Clinical  correlation with patient history and other diagnostic information is  necessary to determine patient infection status.  Positive results do  not rule out bacterial infection or co-infection with other viruses. If result is PRESUMPTIVE POSTIVE SARS-CoV-2 nucleic acids MAY BE PRESENT.   A presumptive positive result was obtained on the submitted specimen  and confirmed on repeat testing.  While 2019 novel coronavirus  (SARS-CoV-2) nucleic acids may be present in the submitted sample  additional confirmatory testing may be necessary for epidemiological  and / or clinical management purposes  to differentiate between  SARS-CoV-2 and other Sarbecovirus currently known to infect humans.  If clinically indicated additional testing with an alternate test  methodology [phone]) is advised. The SARS-CoV-2 RNA is generally  detectable in upper  and lower respiratory sp ecimens during the acute  phase of infection. The expected result is Negative. Fact Sheet for Patients:  StrictlyIdeas.no Fact Sheet for Healthcare Providers: BankingDealers.co.za This test is not yet approved or cleared by the [place] FDA and has been authorized for detection and/or diagnosis of SARS-CoV-2 by FDA under an Emergency Use Authorization (EUA).  This EUA will remain in effect (meaning this test can be used) for the duration of the COVID-19 declaration under Section  564(b)(1) of the Act, 21 U.S.C. section 360bbb-3(b)(1), unless the authorization is terminated or revoked sooner. Performed at [facility], [place] [address], [place], [place] [address]   Blood Culture (routine x 2)     Status: Abnormal (Preliminary result)   Collection Time: 05/26/19 11:29 PM   Specimen: BLOOD  Result Value Ref Range Status   Specimen Description BLOOD RIGHT ANTECUBITAL  Final   Special Requests   Final    BOTTLES DRAWN AEROBIC AND ANAEROBIC Blood Culture adequate volume   Culture  Setup Time   Final    GRAM NEGATIVE RODS IN BOTH AEROBIC AND ANAEROBIC BOTTLES CRITICAL VALUE NOTED.  VALUE IS CONSISTENT WITH PREVIOUSLY REPORTED AND CALLED VALUE. Performed at [facility], [place] [address] [place], [place] [address]    Culture ESCHERICHIA COLI (A)  Final   Report Status PENDING  Incomplete  Blood Culture (routine x 2)     Status: None (Preliminary result)   Collection Time: 05/26/19 11:34 PM   Specimen: BLOOD  Result Value Ref Range Status   Specimen Description BLOOD LEFT ANTECUBITAL  Final   Special Requests   Final    BOTTLES DRAWN AEROBIC AND ANAEROBIC Blood Culture adequate volume   Culture  Setup Time   Final    GRAM NEGATIVE RODS IN BOTH AEROBIC AND ANAEROBIC BOTTLES CRITICAL RESULT CALLED TO, READ BACK BY AND VERIFIED WITH: PHARMD A. PHAM 1420 [id number] FCP Performed at [facility] [address] [place], [place] [address]    Culture GRAM NEGATIVE RODS  Final   Report Status PENDING  Incomplete  Blood Culture ID Panel (Reflexed)     Status: Abnormal   Collection Time: 05/26/19 11:34 PM  Result Value Ref Range Status   Enterococcus species NOT DETECTED NOT DETECTED Final   Listeria monocytogenes NOT DETECTED NOT DETECTED Final   Staphylococcus species NOT DETECTED NOT DETECTED Final   Staphylococcus aureus (BCID) NOT DETECTED NOT DETECTED Final   Streptococcus species NOT DETECTED NOT DETECTED Final   Streptococcus  agalactiae NOT DETECTED NOT DETECTED Final   Streptococcus pneumoniae NOT DETECTED NOT DETECTED Final   Streptococcus pyogenes NOT DETECTED NOT DETECTED Final   Acinetobacter baumannii NOT DETECTED NOT DETECTED Final   Enterobacteriaceae species DETECTED (A) NOT DETECTED Final    Comment: Enterobacteriaceae represent a large family of gram-negative bacteria, not a single organism. CRITICAL RESULT CALLED TO, READ BACK BY AND VERIFIED WITH: PHARMD A. PHAM 1420 [id number] FCP    Enterobacter cloacae complex NOT DETECTED NOT DETECTED Final   Escherichia coli DETECTED (A) NOT DETECTED Final    Comment: CRITICAL RESULT CALLED TO, READ BACK BY AND VERIFIED WITH: PHARMD A. PHAM 1420 [id number] FCP    Klebsiella oxytoca NOT DETECTED NOT DETECTED Final   Klebsiella pneumoniae NOT DETECTED NOT DETECTED Final   Proteus species NOT DETECTED NOT DETECTED Final   Serratia marcescens NOT DETECTED NOT DETECTED Final   Carbapenem resistance NOT DETECTED NOT DETECTED Final   Haemophilus influenzae NOT  DETECTED NOT DETECTED Final   Neisseria meningitidis NOT DETECTED NOT DETECTED Final   Pseudomonas aeruginosa NOT DETECTED NOT DETECTED Final   Candida albicans NOT DETECTED NOT DETECTED Final   Candida glabrata NOT DETECTED NOT DETECTED Final   Candida krusei NOT DETECTED NOT DETECTED Final   Candida parapsilosis NOT DETECTED NOT DETECTED Final   Candida tropicalis NOT DETECTED NOT DETECTED Final    Comment: Performed at [facility], [place] [address], [place], [place] [address]  Urine culture     Status: None   Collection Time: 05/27/19  1:54 AM   Specimen: In/Out Cath Urine  Result Value Ref Range Status   Specimen Description   Final    IN/OUT CATH URINE Performed at [place] [address], [place], [place] [address]    Special Requests   Final    NONE Performed at [facility], [place] [address], [place], [place] [address]    Culture   Final    NO  GROWTH Performed at [facility], [place] [address] [place], [place] [address]    Report Status 05/28/2019 FINAL  Final  MRSA PCR Screening     Status: None   Collection Time: 05/27/19  5:29 AM   Specimen: Nasal Mucosa; Nasopharyngeal  Result Value Ref Range Status   MRSA by PCR NEGATIVE NEGATIVE Final    Comment:        The GeneXpert MRSA Assay (FDA approved for NASAL specimens only), is one component of a comprehensive MRSA colonization surveillance program. It is not intended to diagnose MRSA infection nor to guide or monitor treatment for MRSA infections. Performed at [facility], [place] [address], [place], [place] [address]   Respiratory Panel by PCR     Status: None   Collection Time: 05/28/19 12:30 AM   Specimen: Nasopharyngeal Swab; Respiratory  Result Value Ref Range Status   Adenovirus NOT DETECTED NOT DETECTED Final   Coronavirus 229E NOT DETECTED NOT DETECTED Final    Comment: (NOTE) The Coronavirus on the Respiratory Panel, DOES NOT test for the novel  Coronavirus (2019 nCoV)    Coronavirus HKU1 NOT DETECTED NOT DETECTED Final   Coronavirus NL63 NOT DETECTED NOT DETECTED Final   Coronavirus OC43 NOT DETECTED NOT DETECTED Final   Metapneumovirus NOT DETECTED NOT DETECTED Final   Rhinovirus / Enterovirus NOT DETECTED NOT DETECTED Final   Influenza A NOT DETECTED NOT DETECTED Final   Influenza B NOT DETECTED NOT DETECTED Final   Parainfluenza Virus 1 NOT DETECTED NOT DETECTED Final   Parainfluenza Virus 2 NOT DETECTED NOT DETECTED Final   Parainfluenza Virus 3 NOT DETECTED NOT DETECTED Final   Parainfluenza Virus 4 NOT DETECTED NOT DETECTED Final   Respiratory Syncytial Virus NOT DETECTED NOT DETECTED Final   Bordetella pertussis NOT DETECTED NOT DETECTED Final   Chlamydophila pneumoniae NOT DETECTED NOT DETECTED Final   Mycoplasma pneumoniae NOT DETECTED NOT DETECTED Final    Comment: Performed at [facility], [place]. [address],  [place], [place] [address]         Radiology Studies: Ct Angio Chest Pe W And/or Wo Contrast  Result Date: 05/27/2019 CLINICAL DATA:  Shortness of breath, weakness, fever. 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 EXAM: CT ANGIOGRAPHY CHEST CT ABDOMEN AND PELVIS WITH CONTRAST TECHNIQUE: Multidetector CT imaging of the chest was performed using the standard protocol during bolus administration of intravenous contrast. Multiplanar CT image reconstructions and  MIPs were obtained to evaluate the vascular anatomy. Multidetector CT imaging of the abdomen and pelvis was performed using the standard protocol during bolus administration of intravenous contrast. CONTRAST:  117mL OMNIPAQUE IOHEXOL 350 MG/ML SOLN COMPARISON:  03/27/2019. FINDINGS: CTA CHEST FINDINGS Cardiovascular: Motion degraded images. Within that constraint, there is satisfactory opacification of the bilateral pulmonary arteries to the lobar level. No evidence of pulmonary embolism. No evidence of thoracic aortic aneurysm. Atherosclerotic calcifications of the aortic arch. Heart is normal in size.  No pericardial effusion. Three vessel coronary atherosclerosis. Mediastinum/Nodes: No suspicious mediastinal lymphadenopathy. Visualized thyroid is unremarkable. Lungs/Pleura: Bilateral pulmonary nodules/metastases, motion degraded but favored to be mildly progressive, including: --1.9 cm right upper lobe nodule (series 12/image 53), previously 1.5 cm --2.4 cm superior segment left lower lobe nodule (series 12/image 53), previously 1.8 cm --3.5 cm central right lower lobe mass (series 12/image 109), previously 2.6 cm Extensive centrilobular and paraseptal emphysematous changes, upper lung predominant. No focal consolidation. No pleural effusion or pneumothorax. Musculoskeletal: Degenerative changes of the thoracic spine. Review of the MIP images confirms the above findings. CT ABDOMEN and PELVIS FINDINGS Motion degraded  images. Hepatobiliary: Embolization coils within a branch of the distal left hepatic artery. Partially calcified treated left hepatic lesion in segment 2 measures 5.2 x 3.7 cm, previously 4.8 x 3.7 cm, grossly unchanged. 3.5 x 2.9 cm hypodense lesion in segment 4A (series 4/image 11), previously 3.8 x 2.8 cm, unchanged. Additional 14 mm lesion inferiorly in the right hepatic lobe (series 4/image 27), similar to the prior. Gallbladder is unremarkable. No intrahepatic or extrahepatic ductal dilatation. Pancreas: Within normal limits. Spleen: Within normal limits. Adrenals/Urinary Tract: Adrenal glands are within normal limits. 6.8 cm right upper pole renal cyst. Left kidney is within normal limits. No hydronephrosis. Bladder is underdistended but unremarkable. Stomach/Bowel: Stomach is within normal limits. No evidence of bowel obstruction. Normal appendix (series 4/image 46). Left colon is decompressed. Mild sigmoid diverticulosis, without evidence of diverticulitis. Vascular/Lymphatic: No evidence of abdominal aortic aneurysm. Atherosclerotic calcifications of the abdominal aorta and branch vessels. No suspicious abdominopelvic lymphadenopathy. Reproductive: Prostate is unremarkable. Other: 16.7 x 26.0 x 18.7 cm mass in the right mid abdomen, previously 16.0 x 24.0 x 18.5 cm, mildly increased (calculated volume = 4250 mL versus 3720 mL on the prior). Interval development of necrosis with gas along the left superior aspect of the mass (series 4/image 45). No abdominopelvic ascites. Musculoskeletal: Degenerative changes of the visualized thoracolumbar spine, most prominent at L5-S1. Review of the MIP images confirms the above findings. IMPRESSION: No evidence of pulmonary embolism. 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 Status post chemoembolization. Hepatic metastases are grossly unchanged. Progressive pulmonary metastases, as described above, although  poorly evaluated due to motion degradation. Aortic Atherosclerosis (ICD10-I70.0) and Emphysema (ICD10-J43.9). Electronically Signed   By: Julian Hy M.D.   On: 05/27/2019 03:48   Ct Abdomen Pelvis W Contrast  Result Date: 05/27/2019 CLINICAL DATA:  Shortness of breath, weakness, fever. 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 EXAM: CT ANGIOGRAPHY CHEST CT ABDOMEN AND PELVIS WITH CONTRAST TECHNIQUE: Multidetector CT imaging of the chest was performed using the standard protocol during bolus administration of intravenous contrast. Multiplanar CT image reconstructions and MIPs were obtained to evaluate the vascular anatomy. Multidetector CT imaging of the abdomen and pelvis was performed using the standard protocol during bolus administration of intravenous contrast. CONTRAST:  19mL OMNIPAQUE IOHEXOL 350 MG/ML SOLN COMPARISON:  03/27/2019.  FINDINGS: CTA CHEST FINDINGS Cardiovascular: Motion degraded images. Within that constraint, there is satisfactory opacification of the bilateral pulmonary arteries to the lobar level. No evidence of pulmonary embolism. No evidence of thoracic aortic aneurysm. Atherosclerotic calcifications of the aortic arch. Heart is normal in size.  No pericardial effusion. Three vessel coronary atherosclerosis. Mediastinum/Nodes: No suspicious mediastinal lymphadenopathy. Visualized thyroid is unremarkable. Lungs/Pleura: Bilateral pulmonary nodules/metastases, motion degraded but favored to be mildly progressive, including: --1.9 cm right upper lobe nodule (series 12/image 53), previously 1.5 cm --2.4 cm superior segment left lower lobe nodule (series 12/image 53), previously 1.8 cm --3.5 cm central right lower lobe mass (series 12/image 109), previously 2.6 cm Extensive centrilobular and paraseptal emphysematous changes, upper lung predominant. No focal consolidation. No pleural effusion or pneumothorax. Musculoskeletal: Degenerative changes of  the thoracic spine. Review of the MIP images confirms the above findings. CT ABDOMEN and PELVIS FINDINGS Motion degraded images. Hepatobiliary: Embolization coils within a branch of the distal left hepatic artery. Partially calcified treated left hepatic lesion in segment 2 measures 5.2 x 3.7 cm, previously 4.8 x 3.7 cm, grossly unchanged. 3.5 x 2.9 cm hypodense lesion in segment 4A (series 4/image 11), previously 3.8 x 2.8 cm, unchanged. Additional 14 mm lesion inferiorly in the right hepatic lobe (series 4/image 27), similar to the prior. Gallbladder is unremarkable. No intrahepatic or extrahepatic ductal dilatation. Pancreas: Within normal limits. Spleen: Within normal limits. Adrenals/Urinary Tract: Adrenal glands are within normal limits. 6.8 cm right upper pole renal cyst. Left kidney is within normal limits. No hydronephrosis. Bladder is underdistended but unremarkable. Stomach/Bowel: Stomach is within normal limits. No evidence of bowel obstruction. Normal appendix (series 4/image 46). Left colon is decompressed. Mild sigmoid diverticulosis, without evidence of diverticulitis. Vascular/Lymphatic: No evidence of abdominal aortic aneurysm. Atherosclerotic calcifications of the abdominal aorta and branch vessels. No suspicious abdominopelvic lymphadenopathy. Reproductive: Prostate is unremarkable. Other: 16.7 x 26.0 x 18.7 cm mass in the right mid abdomen, previously 16.0 x 24.0 x 18.5 cm, mildly increased (calculated volume = 4250 mL versus 3720 mL on the prior). Interval development of necrosis with gas along the left superior aspect of the mass (series 4/image 45). No abdominopelvic ascites. Musculoskeletal: Degenerative changes of the visualized thoracolumbar spine, most prominent at L5-S1. Review of the MIP images confirms the above findings. IMPRESSION: No evidence of pulmonary embolism. 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 Status post  chemoembolization. Hepatic metastases are grossly unchanged. Progressive pulmonary metastases, as described above, although poorly evaluated due to motion degradation. Aortic Atherosclerosis (ICD10-I70.0) and Emphysema (ICD10-J43.9). Electronically Signed   By: Julian Hy M.D.   On: 05/27/2019 03:48   Dg Chest Port 1 View  Result Date: 05/26/2019 CLINICAL DATA:  Shortness of breath and fever for several hours EXAM: PORTABLE CHEST 1 VIEW COMPARISON:  05/06/2019 FINDINGS: Cardiac shadow is stable. Aortic calcifications are seen. The lungs are well aerated bilaterally with emphysematous changes and crowding of the markings in the bases. Multiple bilateral pulmonary nodules are seen. Slight increased density in the bases is noted when compare with the prior exam likely representing some superimposed atelectatic change. IMPRESSION: Multifocal pulmonary nodules consistent with metastatic disease. Mild increased atelectasis in the bases superimposed over changes of COPD. Electronically Signed   By: Inez Catalina M.D.   On: 05/26/2019 23:46   Vas [place] Lower Extremity Venous (dvt)  Result Date: 05/27/2019  Lower Venous Study Indications: Edema.  Limitations: Poor ultrasound/tissue interface. Comparison Study: no  prior Performing Technologist: June Leap RDMS, RVT  Examination Guidelines: A complete evaluation includes B-mode imaging, spectral Doppler, color Doppler, and power Doppler as needed of all accessible portions of each vessel. Bilateral testing is considered an integral part of a complete examination. Limited examinations for reoccurring indications may be performed as noted.  +---------+---------------+---------+-----------+----------+--------------+  RIGHT     Compressibility Phasicity Spontaneity Properties Summary         +---------+---------------+---------+-----------+----------+--------------+  CFV       Full            Yes       Yes                                     +---------+---------------+---------+-----------+----------+--------------+  SFJ       Full                                                             +---------+---------------+---------+-----------+----------+--------------+  FV Prox   Full                                                             +---------+---------------+---------+-----------+----------+--------------+  FV Mid    Full                                                             +---------+---------------+---------+-----------+----------+--------------+  FV Distal Full                                                             +---------+---------------+---------+-----------+----------+--------------+  PFV       Full                                                             +---------+---------------+---------+-----------+----------+--------------+  POP       Full            Yes       Yes                                    +---------+---------------+---------+-----------+----------+--------------+  PTV       Full                                                             +---------+---------------+---------+-----------+----------+--------------+  PERO                                                       Not visualized  +---------+---------------+---------+-----------+----------+--------------+   +---------+---------------+---------+-----------+----------+-------+  LEFT      Compressibility Phasicity Spontaneity Properties Summary  +---------+---------------+---------+-----------+----------+-------+  CFV       Full            Yes       Yes                             +---------+---------------+---------+-----------+----------+-------+  SFJ       Full                                                      +---------+---------------+---------+-----------+----------+-------+  FV Prox   Full                                                      +---------+---------------+---------+-----------+----------+-------+  FV Mid    Full                                                       +---------+---------------+---------+-----------+----------+-------+  FV Distal Full                                                      +---------+---------------+---------+-----------+----------+-------+  PFV       Full                                                      +---------+---------------+---------+-----------+----------+-------+  POP       Full            Yes       Yes                             +---------+---------------+---------+-----------+----------+-------+  PTV       Full                                                      +---------+---------------+---------+-----------+----------+-------+  PERO      Full                                                      +---------+---------------+---------+-----------+----------+-------+  Summary: Right: There is no evidence of deep vein thrombosis in the lower extremity. No cystic structure found in the popliteal fossa. Left: There is no evidence of deep vein thrombosis in the lower extremity. No cystic structure found in the popliteal fossa.  *See table(s) above for measurements and observations.    Preliminary         Scheduled Meds:  ALPRAZolam  0.5 mg Oral QHS   budesonide  0.5 mg Nebulization BID   chlorhexidine  15 mL Mouth Rinse BID   Chlorhexidine Gluconate Cloth  6 each Topical Daily   cholecalciferol  400 Units Oral QHS   ipratropium-albuterol  3 mL Nebulization Q4H   mouth rinse  15 mL Mouth Rinse q12n4p   methocarbamol  1,000 mg Oral QHS   methylPREDNISolone (SOLU-MEDROL) injection  60 mg Intravenous TID   metoprolol tartrate  25 mg Oral BID   multivitamin  1 tablet Oral BID   pantoprazole  40 mg Oral Daily   simvastatin  20 mg Oral q1800   vitamin B-12  1,000 mcg Oral Daily   vitamin C  1,000 mg Oral Daily   Continuous Infusions:  sodium chloride Stopped (05/28/19 0209)   cefTRIAXone (ROCEPHIN)  IV Stopped (05/27/19 1645)   vancomycin Stopped (05/28/19 0339)      LOS: 1 day    Time spent: 35 minutes spent in the coordination of care today.    Jonnie Finner, DO [facility] Pager [phone]  If 7PM-7AM, please contact night-coverage [email or web address] Password [facility] 05/28/2019, 2:05 PM

## 2019-05-28 NOTE — Progress Notes (Signed)
CRITICAL VALUE ALERT  Critical Value:  Lactic Acid 3.2  Date & Time Notied:  05/28/2019 1044  Provider Notified: Sylvan Cheese   Orders Received/Actions taken: no new orders received, LA trending down

## 2019-05-28 NOTE — Progress Notes (Signed)
Pt has had a lot of anxiety today and required his breathing Tx. Early. The Pt was upset because he said the doctor had said something about him going home with hospice.

## 2019-05-28 NOTE — Progress Notes (Signed)
  Critical Value:  Lactic Acid 2.8  Date & Time Notied:  05/28/2019 1315  Provider Notified: Sylvan Cheese   Orders Received/Actions taken: no new orders received, LA trending down

## 2019-05-28 NOTE — Progress Notes (Signed)
Notified Attending Sylvan Cheese of sustained HR 130s-140s. Pt sitting on bedside anxious/SOB.   Will give PRN 1mg  Ativan and new orders received for 5mg  Metoprolol IV, 1 time dose.

## 2019-05-28 NOTE — Progress Notes (Signed)
Notified Attending Cherylann Ratel of AM Hgb 6.7, MD called back and will address AM labs/HBG upon rounding. No new orders verbally received.

## 2019-05-28 NOTE — TOC Initial Note (Signed)
Transition of Care [facility]) - Initial/Assessment Note    Patient Details  Name: [name] MRN: [id number] Date of Birth: 20-Jan-1946  [facility] CM/SW Contact:    Nila Nephew, LCSW Phone Number: [phone] 05/28/2019, 3:01 PM  Clinical Narrative:     Completed High Readmission Risk Screening due to score 48%. Admitted with respiratory failure secondary to COPD exacerbation, on BiPAP. Pt established with outpatient palliative care, has been having conversations about hospice as well, not currently wanting hospice measures.       Activities of Daily Living Home Assistive Devices/Equipment: Oxygen, BIPAP, Eyeglasses ADL Screening (condition at time of admission) Patient's cognitive ability adequate to safely complete daily activities?: Yes Is the patient deaf or have difficulty hearing?: Yes Does the patient have difficulty seeing, even when wearing glasses/contacts?: No Does the patient have difficulty concentrating, remembering, or making decisions?: No Patient able to express need for assistance with ADLs?: Yes Does the patient have difficulty dressing or bathing?: Yes Independently performs ADLs?: Yes (appropriate for developmental age) Communication: Independent Dressing (OT): Needs assistance Is this a change from baseline?: Pre-admission baseline Grooming: Needs assistance Is this a change from baseline?: Pre-admission baseline Feeding: Independent Bathing: Needs assistance Is this a change from baseline?: Pre-admission baseline Toileting: Needs assistance Is this a change from baseline?: Pre-admission baseline In/Out Bed: Needs assistance Is this a change from baseline?: Pre-admission baseline Walks in Home: Independent Does the patient have difficulty walking or climbing stairs?: No Weakness of Legs: None Weakness of Arms/Hands: None  Permission Sought/Granted                  Emotional Assessment              Admission diagnosis:   Sepsis with acute hypoxic respiratory failure without septic shock, due to unspecified organism ([place]) [A41.9, R65.20, J96.01] Patient Active Problem List   Diagnosis Date Noted  . Sepsis ([place]) 05/27/2019  . COPD exacerbation ([place]) 05/27/2019  . Shortness of breath   . Symptomatic anemia 05/06/2019  . Anemia of chronic disease   . OSA (obstructive sleep apnea)   . SBO (small bowel obstruction) ([place]) 03/27/2019  . Nausea vomiting and diarrhea   . Weakness   . SOB (shortness of breath)   . Elevated troponin   . Palliative care by specialist   . Goals of care, counseling/discussion   . Abdominal pain   . GI bleed 02/18/2019  . DNR (do not resuscitate) 12/21/2018  . Anemia 12/14/2018  . Liver metastasis ([place]) 07/13/2018  . Acute blood loss anemia 06/07/2018  . Rectal bleeding 05/13/2018  . Chronic diastolic CHF (congestive heart failure) ([place]) 05/13/2018  . Lower GI bleed 05/13/2018  . Class 2 obesity with body mass index (BMI) of 37.0 to 37.9 in adult 01/10/2018  . 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 Acute on chronic diastolic CHF (congestive heart failure) ([place]) 01/19/2017  . Chronic respiratory failure with hypoxia and hypercapnia ([place]) 10/13/2016  . OSA treated with BiPAP 10/13/2016  . Macrocytic anemia 09/24/2016  . COPD with acute exacerbation ([place]) 09/24/2016  . Acute bronchitis 02/03/2016  . Ex-smoker 10/31/2015  . Dilated cardiomyopathy ([place])   . Pulmonary hypertension ([place])   . Acute on chronic respiratory failure with hypoxia and hypercapnia ([place]) 10/20/2015  . 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 LBP (low back pain) 03/24/2011  . Malignant neoplasm of bladder ([place]) 09/22/2010  . HEMATURIA UNSPECIFIED 05/14/2010  . Osteoarthritis 03/28/2010  . Coronary atherosclerosis of  native coronary artery 12/19/2007  . Acute sinusitis 12/19/2007  . End stage COPD ([place]) 12/19/2007  . COLONIC POLYPS 12/18/2007  . HYPERCHOLESTEROLEMIA 12/18/2007  . Anxiety  12/18/2007  . Essential hypertension 12/18/2007  . GASTRITIS 12/18/2007  . Impaired fasting glucose 12/18/2007   PCP:  [name], MD Pharmacy:   [id number] - [place], [address] [place] [address] Phone: [phone] Fax: [place] [address] Phone: [phone] Fax: [place] [address] Phone: [phone] Fax: [phone]  [place], [address] [place] [address] Phone: [phone] Fax: [phone]     Social Determinants of Health (SDOH) Interventions    Readmission Risk Interventions Readmission Risk Prevention Plan 05/28/2019  Transportation Screening Complete  Medication Review Press photographer) Complete  PCP or Specialist appointment within 3-5 days of discharge Not Complete  HRI or [place] Not Complete  HRI or Home Care Consult Pt Refusal Comments no indication  SW Recovery Care/Counseling Consult Complete  Palliative Care Screening Complete  [place] Not Applicable  Some recent data might be hidden

## 2019-05-28 NOTE — Progress Notes (Addendum)
CRITICAL VALUE ALERT  Critical Value:  hgb 6.7  Date & Time Notied:  7/13 @ 0420a  Provider Notified: [facility] on call  Orders Received/Actions taken: Awaiting orders   - as of 7395 am, no orders received - On call paged 3x

## 2019-05-29 LAB — TYPE AND SCREEN
ABO/RH(D): A POS
Antibody Screen: NEGATIVE
Unit division: 0
Unit division: 0

## 2019-05-29 LAB — CULTURE, BLOOD (ROUTINE X 2)
Special Requests: ADEQUATE
Special Requests: ADEQUATE

## 2019-05-29 LAB — RENAL FUNCTION PANEL
Albumin: 2.9 g/dL — ABNORMAL LOW (ref 3.5–5.0)
Anion gap: 9 (ref 5–15)
BUN: 26 mg/dL — ABNORMAL HIGH (ref 8–23)
CO2: 23 mmol/L (ref 22–32)
Calcium: 8.2 mg/dL — ABNORMAL LOW (ref 8.9–10.3)
Chloride: 103 mmol/L (ref 98–111)
Creatinine, Ser: 0.75 mg/dL (ref 0.61–1.24)
GFR calc Af Amer: >60 mL/min (ref >60)
GFR calc non Af Amer: >60 mL/min (ref >60)
Glucose, Bld: 197 mg/dL — ABNORMAL HIGH (ref 70–99)
Phosphorus: 3.1 mg/dL (ref 2.5–4.6)
Potassium: 4.4 mmol/L (ref 3.5–5.1)
Sodium: 135 mmol/L (ref 135–145)

## 2019-05-29 LAB — CBC WITH DIFFERENTIAL/PLATELET
Abs Immature Granulocytes: 0.29 10*3/uL — ABNORMAL HIGH (ref 0.00–0.07)
Basophils Absolute: 0 10*3/uL (ref 0.0–0.1)
Basophils Relative: 0 %
Eosinophils Absolute: 0 10*3/uL (ref 0.0–0.5)
Eosinophils Relative: 0 %
HCT: 25.9 % — ABNORMAL LOW (ref 39.0–52.0)
Hemoglobin: 8.3 g/dL — ABNORMAL LOW (ref 13.0–17.0)
Immature Granulocytes: 2 %
Lymphocytes Relative: 3 %
Lymphs Abs: 0.4 10*3/uL — ABNORMAL LOW (ref 0.7–4.0)
MCH: 31.3 pg (ref 26.0–34.0)
MCHC: 32 g/dL (ref 30.0–36.0)
MCV: 97.7 fL (ref 80.0–100.0)
Monocytes Absolute: 0.5 10*3/uL (ref 0.1–1.0)
Monocytes Relative: 4 %
Neutro Abs: 12.2 10*3/uL — ABNORMAL HIGH (ref 1.7–7.7)
Neutrophils Relative %: 91 %
Platelets: 124 10*3/uL — ABNORMAL LOW (ref 150–400)
RBC: 2.65 MIL/uL — ABNORMAL LOW (ref 4.22–5.81)
RDW: 19.5 % — ABNORMAL HIGH (ref 11.5–15.5)
WBC: 13.3 10*3/uL — ABNORMAL HIGH (ref 4.0–10.5)
nRBC: 0.3 % — ABNORMAL HIGH (ref 0.0–0.2)

## 2019-05-29 LAB — BPAM RBC
Blood Product Expiration Date: 202008062359
Blood Product Expiration Date: 202008062359
ISSUE DATE / TIME: 202007120619
ISSUE DATE / TIME: 202007131248
Unit Type and Rh: 6200
Unit Type and Rh: 6200

## 2019-05-29 LAB — MAGNESIUM: Magnesium: 2.5 mg/dL — ABNORMAL HIGH (ref 1.7–2.4)

## 2019-05-29 MED ORDER — SODIUM CHLORIDE 0.9 % IV SOLN
2.0000 g | INTRAVENOUS | Status: DC
  Administered 2019-05-29 – 2019-06-02 (×22): 2 g via INTRAVENOUS
  Filled 2019-05-29 (×2): qty 2
  Filled 2019-05-29 (×2): qty 2000
  Filled 2019-05-29 (×2): qty 2
  Filled 2019-05-29 (×4): qty 2000
  Filled 2019-05-29 (×4): qty 2
  Filled 2019-05-29: qty 2000
  Filled 2019-05-29 (×4): qty 2
  Filled 2019-05-29: qty 2000
  Filled 2019-05-29 (×2): qty 2
  Filled 2019-05-29 (×2): qty 2000
  Filled 2019-05-29 (×2): qty 2
  Filled 2019-05-29: qty 2000

## 2019-05-29 NOTE — Progress Notes (Addendum)
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  Patient states that he started having worsening shortness of breath, fever and chills since this afternoon. He had temperature 102 at home. He has dry cough, denies chest pain, runny nose or sore throat. Per report, he had oxygen desaturation to 70s% on nonrebreather, which improved to 98% on BiPAP. Patient denies nausea,vomiting, diarrhea, abdominal pain, symptoms of UTI. He denies dark stool or rectal bleeding currently. Of note, patient has history of GI bleeding, andgetting frequent blood transfusion. He refused GI evaluation recently.   Assessment & Plan:   Principal Problem:   Acute on chronic respiratory failure with hypoxia and hypercapnia (HCC) Active Problems:   Malignant neoplasm of bladder (HCC)   HYPERCHOLESTEROLEMIA   Anxiety   Essential hypertension   Macrocytic anemia   COPD with acute exacerbation (HCC)   Chronic diastolic CHF (congestive heart failure) (HCC)   DNR (do not resuscitate)   Elevated troponin   OSA (obstructive sleep apnea)   Sepsis (HCC)   COPD exacerbation (HCC)   Shortness of breath   Acute on chronic respiratory failure with hypoxia and hypercapnia due to COPD exacerbation:     - COVID testing negative     - elevated d-dimer; CTA negative for PE     - ABG: ph 7.391, pCO2 41, pO2 157     - vanc/rocephin     - Inhaler: Atrovent inhaler, prn  Xopenex inhaler     - Solu-Medrol 60 mg IV tid     - Mucinex for cough      - Incentive spirometry     - Urine S. 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 coli x 2     - procal: 4     - lactic acid: 2.8 -> 2.6 -> 3.2 -> 5.4 -> 3.2 -> 2.8     - Hx of HF, giving fluids, but must monitor  Malignant neoplasm of bladder     - CTA showed an enlarged tumor size, hepatic metastases, progressive pulmonary metastasis.       - Patient is s/p radiation therapy, no surgery and chemotherapy per patient.     - Palliative consult: "DNR DNI. Home with continuation of home based palliative care. Patient doesn't want hospice, doesn't want to talk about hospice services.  Continue current mode of care."   HLD     - zocor  Anxiety:     - xanax  Essential hypertension:      - Blood pressures are soft     - resume metoprolol     - hold lasix due to sepsis  Macrocytic anemia:      - Hemoglobin 7.6 (  7.8 on 05/07/2019, 8.3 on 05/06/2019).       - Denies rectal bleeding or dark stool.      - Pt had symptomatic anemia which required 2 units pRBC transfusion. Pt has possible GI loss, but he denies declined evaluation by surgery or GI     - 1 unit of blood was ordered by ED physician.     - his plan is for intermittent transfusions going forward as needed     - Hgb 6.7 this AM. Transfuse 1 unit pRBCs     - Hgb appropriate response, 8.3 this AM  Chronic diastolic CHF:      - BNP 935, CXR did not show pulmonary edema.     - Hold Lasix due to sepsis  OSA:     - on BiPAP  Elevated troponin:      - Troponins are minimally elevated, 17/20.       - Denies any chest pain most likely due to demand ischemia secondary to sepsis and hypoxia.     - No aspirin due to possible GI  bleeding     - Continue Zocor     - PRN nitroglycerin  C&S back. Will treat bacteremia w/ Ampicillin. Would normally look at a 2 week course, but source most likely the necrotic tumor; so we won't be able to obtain source control. Patient not willing to discuss hospice. PC has already spoken with him. Let's repeat Bld Cx today. End point outside of hospice is unclear. Spoke with wife and dtr by phone.  DVT prophylaxis:SCD Code Status:DNR Disposition Plan:TBD   Consultants:  Palliative Care  Antimicrobials:  ampicillin  Subjective: No acute events ON per nursing  Objective: Vitals:   05/29/19 0400 05/29/19 0610 05/29/19 0800 05/29/19 1009  BP: 124/70  125/70 125/70  Pulse: 65  77 83  Resp: 15  20   Temp: (!) 97.4 F (36.3 C)  (!) 97.4 F (36.3 C)   TempSrc: Axillary  Oral   SpO2: 99%  100%   Weight:  92.5 kg    Height:        Intake/Output Summary (Last 24 hours) at 05/29/2019 1135 Last data filed at 05/29/2019 0752 Gross per 24 hour  Intake 1339.03 ml  Output 1100 ml  Net 239.03 ml   Filed Weights   05/26/19 2306 05/28/19 0627 05/29/19 0610  Weight: 90.7 kg 92.7 kg 92.5 kg    Examination:  General:73 y.o.maleill appearresting on side of bed Cardiovascular: RRR, +S1, S2, no m/g/r, equal pulses throughout Respiratory:tight, increased WOB, breathless w/ speech GI: BS+, NDNT, no masses noted, no organomegaly noted MSK: No c/c. B/l pedal edema Neuro: A&O x 3, no focal deficits  Data Reviewed: I have personally reviewed following labs and imaging studies.  CBC: Recent Labs  Lab 05/26/19 2321 05/28/19 0239 05/28/19 1728 05/29/19 0722  WBC 10.7* 13.0*  --  13.3*  NEUTROABS 9.7* 11.6*  --  12.2*  HGB 7.6* 6.7* 8.7* 8.3*  HCT 25.3* 21.8* 27.7* 25.9*  MCV 100.0 98.6  --  97.7  PLT 199 137*  --  701*   Basic Metabolic Panel: Recent Labs  Lab 05/26/19 2321 05/28/19 0239 05/29/19 0722  NA 136 135 135  K 3.5 4.3 4.4  CL 101 102 103  CO2  24 22 23   GLUCOSE 148* 183* 197*  BUN 22 30* 26*  CREATININE 1.00 0.82 0.75  CALCIUM 8.3* 8.0* 8.2*  MG  --  2.0 2.5*  PHOS  --  3.2 3.1   GFR: Estimated Creatinine Clearance: 86.3 mL/min (by C-G formula based on SCr of 0.75 mg/dL). Liver Function Tests: Recent Labs  Lab 05/26/19 2321 05/28/19 0239 05/29/19 0722  AST 59*  --   --   ALT 47*  --   --   ALKPHOS 119  --   --   BILITOT 4.4*  --   --   PROT 6.8  --   --   ALBUMIN 3.3* 3.1* 2.9*   Recent Labs  Lab 05/26/19 2321  LIPASE 31   No results for input(s): AMMONIA in the last 168 hours. Coagulation Profile: Recent Labs  Lab 05/26/19 2321  INR 1.2   Cardiac Enzymes: No results for input(s): CKTOTAL, CKMB, CKMBINDEX, TROPONINI in the last 168 hours. BNP (last 3 results) No results for input(s): PROBNP in the last 8760 hours. HbA1C: Recent Labs    05/27/19 1216  HGBA1C 5.6   CBG: No results for input(s): GLUCAP in the last 168 hours. Lipid Profile: Recent Labs    05/26/19 2329 05/28/19 0237  CHOL  --  106  HDL  --  23*  LDLCALC  --  66  TRIG 61 83  CHOLHDL  --  4.6   Thyroid Function Tests: No results for input(s): TSH, T4TOTAL, FREET4, T3FREE, THYROIDAB in the last 72 hours. Anemia Panel: Recent Labs    05/27/19 0130 05/27/19 0134 05/27/19 0154  VITAMINB12  --  432  --   FOLATE  --  24.5  --   FERRITIN  --   --  868*  TIBC  --  220*  --   IRON  --  20*  --   RETICCTPCT 5.7*  --   --    Sepsis Labs: Recent Labs  Lab 05/26/19 2321  05/27/19 1216 05/27/19 1446 05/28/19 1009 05/28/19 1249  PROCALCITON 4.29  --   --   --   --   --   LATICACIDVEN 2.8*   < > 3.2* 5.4* 3.2* 2.8*   < > = values in this interval not displayed.    Recent Results (from the past 240 hour(s))  SARS Coronavirus 2 (CEPHEID- Performed in [place] hospital lab), Hosp Order     Status: None   Collection Time: 05/26/19 11:28 PM   Specimen: Nasopharyngeal Swab  Result Value Ref Range Status   SARS Coronavirus 2  NEGATIVE NEGATIVE Final    Comment: (NOTE) If result is NEGATIVE SARS-CoV-2 target nucleic acids are NOT DETECTED. The SARS-CoV-2 RNA is generally detectable in upper and lower  respiratory specimens during the acute phase of infection. The lowest  concentration of SARS-CoV-2 viral copies this assay can detect is 250  copies / mL. A negative result does not preclude SARS-CoV-2 infection  and should not be used as the sole basis for treatment or other  patient management decisions.  A negative result may occur with  improper specimen collection / handling, submission of specimen other  than nasopharyngeal swab, presence of viral mutation(s) within the  areas targeted by this assay, and inadequate number of viral copies  (<250 copies / mL). A negative result must be combined with clinical  observations, patient history, and epidemiological information. If result is POSITIVE SARS-CoV-2 target nucleic acids are DETECTED. The SARS-CoV-2 RNA is generally detectable in upper and lower  respiratory specimens dur ing the acute phase of infection.  Positive  results are indicative of active infection with SARS-CoV-2.  Clinical  correlation with patient history and other diagnostic  information is  necessary to determine patient infection status.  Positive results do  not rule out bacterial infection or co-infection with other viruses. If result is PRESUMPTIVE POSTIVE SARS-CoV-2 nucleic acids MAY BE PRESENT.   A presumptive positive result was obtained on the submitted specimen  and confirmed on repeat testing.  While 2019 novel coronavirus  (SARS-CoV-2) nucleic acids may be present in the submitted sample  additional confirmatory testing may be necessary for epidemiological  and / or clinical management purposes  to differentiate between  SARS-CoV-2 and other Sarbecovirus currently known to infect humans.  If clinically indicated additional testing with an alternate test  methodology [id number])  is advised. The SARS-CoV-2 RNA is generally  detectable in upper and lower respiratory sp ecimens during the acute  phase of infection. The expected result is Negative. Fact Sheet for Patients:  StrictlyIdeas.no Fact Sheet for Healthcare Providers: BankingDealers.co.za This test is not yet approved or cleared by the [place] FDA and has been authorized for detection and/or diagnosis of SARS-CoV-2 by FDA under an Emergency Use Authorization (EUA).  This EUA will remain in effect (meaning this test can be used) for the duration of the COVID-19 declaration under Section 564(b)(1) of the Act, 21 U.S.C. section 360bbb-3(b)(1), unless the authorization is terminated or revoked sooner. Performed at [facility], [place] [address] [place], [place] [address]   Blood Culture (routine x 2)     Status: Abnormal   Collection Time: 05/26/19 11:29 PM   Specimen: BLOOD  Result Value Ref Range Status   Specimen Description BLOOD RIGHT ANTECUBITAL  Final   Special Requests   Final    BOTTLES DRAWN AEROBIC AND ANAEROBIC Blood Culture adequate volume   Culture  Setup Time   Final    GRAM NEGATIVE RODS IN BOTH AEROBIC AND ANAEROBIC BOTTLES CRITICAL VALUE NOTED.  VALUE IS CONSISTENT WITH PREVIOUSLY REPORTED AND CALLED VALUE. GRAM POSITIVE RODS ANAEROBIC BOTTLE ONLY Performed at [facility], [place] [address], [place], [place] [address]    Culture ESCHERICHIA COLI CLOSTRIDIUM PERFRINGENS  (A)  Final   Report Status 05/29/2019 FINAL  Final   Organism ID, Bacteria ESCHERICHIA COLI  Final      Susceptibility   Escherichia coli - MIC*    AMPICILLIN <=2 SENSITIVE Sensitive     CEFAZOLIN <=4 SENSITIVE Sensitive     CEFEPIME <=1 SENSITIVE Sensitive     CEFTAZIDIME <=1 SENSITIVE Sensitive     CEFTRIAXONE <=1 SENSITIVE Sensitive     CIPROFLOXACIN <=0.25 SENSITIVE Sensitive     GENTAMICIN <=1 SENSITIVE Sensitive     IMIPENEM <=0.25  SENSITIVE Sensitive     TRIMETH/SULFA <=20 SENSITIVE Sensitive     AMPICILLIN/SULBACTAM <=2 SENSITIVE Sensitive     PIP/TAZO <=4 SENSITIVE Sensitive     Extended ESBL NEGATIVE Sensitive     * ESCHERICHIA COLI  Blood Culture (routine x 2)     Status: Abnormal   Collection Time: 05/26/19 11:34 PM   Specimen: BLOOD  Result Value Ref Range Status   Specimen Description BLOOD LEFT ANTECUBITAL  Final   Special Requests   Final    BOTTLES DRAWN AEROBIC AND ANAEROBIC Blood Culture adequate volume   Culture  Setup Time   Final    GRAM NEGATIVE RODS IN BOTH AEROBIC AND ANAEROBIC BOTTLES CRITICAL RESULT CALLED TO, READ BACK BY AND VERIFIED WITH: PHARMD A. PHAM [id number] [id number] FCP GRAM POSITIVE RODS ANAEROBIC BOTTLE ONLY Performed at [facility], [place] [address],  [place], [place] [address]    Culture ESCHERICHIA COLI CLOSTRIDIUM PERFRINGENS  (A)  Final   Report Status 05/29/2019 FINAL  Final  Blood Culture ID Panel (Reflexed)     Status: Abnormal   Collection Time: 05/26/19 11:34 PM  Result Value Ref Range Status   Enterococcus species NOT DETECTED NOT DETECTED Final   Listeria monocytogenes NOT DETECTED NOT DETECTED Final   Staphylococcus species NOT DETECTED NOT DETECTED Final   Staphylococcus aureus (BCID) NOT DETECTED NOT DETECTED Final   Streptococcus species NOT DETECTED NOT DETECTED Final   Streptococcus agalactiae NOT DETECTED NOT DETECTED Final   Streptococcus pneumoniae NOT DETECTED NOT DETECTED Final   Streptococcus pyogenes NOT DETECTED NOT DETECTED Final   Acinetobacter baumannii NOT DETECTED NOT DETECTED Final   Enterobacteriaceae species DETECTED (A) NOT DETECTED Final    Comment: Enterobacteriaceae represent a large family of gram-negative bacteria, not a single organism. CRITICAL RESULT CALLED TO, READ BACK BY AND VERIFIED WITH: PHARMD A. PHAM 1420 [id number] FCP    Enterobacter cloacae complex NOT DETECTED NOT DETECTED Final   Escherichia coli DETECTED (A) NOT DETECTED  Final    Comment: CRITICAL RESULT CALLED TO, READ BACK BY AND VERIFIED WITH: PHARMD A. PHAM 1420 [id number] FCP    Klebsiella oxytoca NOT DETECTED NOT DETECTED Final   Klebsiella pneumoniae NOT DETECTED NOT DETECTED Final   Proteus species NOT DETECTED NOT DETECTED Final   Serratia marcescens NOT DETECTED NOT DETECTED Final   Carbapenem resistance NOT DETECTED NOT DETECTED Final   Haemophilus influenzae NOT DETECTED NOT DETECTED Final   Neisseria meningitidis NOT DETECTED NOT DETECTED Final   Pseudomonas aeruginosa NOT DETECTED NOT DETECTED Final   Candida albicans NOT DETECTED NOT DETECTED Final   Candida glabrata NOT DETECTED NOT DETECTED Final   Candida krusei NOT DETECTED NOT DETECTED Final   Candida parapsilosis NOT DETECTED NOT DETECTED Final   Candida tropicalis NOT DETECTED NOT DETECTED Final    Comment: Performed at [facility], [place] [address], [place], [place] [address]  Urine culture     Status: None   Collection Time: 05/27/19  1:54 AM   Specimen: In/Out Cath Urine  Result Value Ref Range Status   Specimen Description   Final    IN/OUT CATH URINE Performed at [place] [address], [place], [place] [address]    Special Requests   Final    NONE Performed at [facility], [place] [address], [place], [place] [address]    Culture   Final    NO GROWTH Performed at [facility], [place] [address] [place], [place] [address]    Report Status 05/28/2019 FINAL  Final  MRSA PCR Screening     Status: None   Collection Time: 05/27/19  5:29 AM   Specimen: Nasal Mucosa; Nasopharyngeal  Result Value Ref Range Status   MRSA by PCR NEGATIVE NEGATIVE Final    Comment:        The GeneXpert MRSA Assay (FDA approved for NASAL specimens only), is one component of a comprehensive MRSA colonization surveillance program. It is not intended to diagnose MRSA infection nor to guide or monitor treatment for MRSA infections. Performed at  [facility], [place] [address], [place], [place] [address]   Respiratory Panel by PCR     Status: None   Collection Time: 05/28/19 12:30 AM   Specimen: Nasopharyngeal Swab; Respiratory  Result Value Ref Range Status   Adenovirus NOT DETECTED NOT DETECTED Final  Coronavirus 229E NOT DETECTED NOT DETECTED Final    Comment: (NOTE) The Coronavirus on the Respiratory Panel, DOES NOT test for the novel  Coronavirus (2019 nCoV)    Coronavirus HKU1 NOT DETECTED NOT DETECTED Final   Coronavirus NL63 NOT DETECTED NOT DETECTED Final   Coronavirus OC43 NOT DETECTED NOT DETECTED Final   Metapneumovirus NOT DETECTED NOT DETECTED Final   Rhinovirus / Enterovirus NOT DETECTED NOT DETECTED Final   Influenza A NOT DETECTED NOT DETECTED Final   Influenza B NOT DETECTED NOT DETECTED Final   Parainfluenza Virus 1 NOT DETECTED NOT DETECTED Final   Parainfluenza Virus 2 NOT DETECTED NOT DETECTED Final   Parainfluenza Virus 3 NOT DETECTED NOT DETECTED Final   Parainfluenza Virus 4 NOT DETECTED NOT DETECTED Final   Respiratory Syncytial Virus NOT DETECTED NOT DETECTED Final   Bordetella pertussis NOT DETECTED NOT DETECTED Final   Chlamydophila pneumoniae NOT DETECTED NOT DETECTED Final   Mycoplasma pneumoniae NOT DETECTED NOT DETECTED Final    Comment: Performed at [facility], [place] [address] [place], [place] [address]     Radiology Studies: No results found.   Scheduled Meds: . ALPRAZolam  0.5 mg Oral QHS  . budesonide  0.5 mg Nebulization BID  . chlorhexidine  15 mL Mouth Rinse BID  . Chlorhexidine Gluconate Cloth  6 each Topical Daily  . cholecalciferol  400 Units Oral QHS  . ipratropium-albuterol  3 mL Nebulization Q4H  . mouth rinse  15 mL Mouth Rinse q12n4p  . methocarbamol  1,000 mg Oral QHS  . methylPREDNISolone (SOLU-MEDROL) injection  60 mg Intravenous TID  . metoprolol tartrate  25 mg Oral BID  . multivitamin  1 tablet Oral BID  . pantoprazole  40 mg Oral  Daily  . simvastatin  20 mg Oral q1800  . vitamin B-12  1,000 mcg Oral Daily  . vitamin C  1,000 mg Oral Daily   Continuous Infusions: . sodium chloride Stopped (05/28/19 0209)  . cefTRIAXone (ROCEPHIN)  IV Stopped (05/28/19 1750)     LOS: 2 days    Time spent: 35 minutes spent in the coordination of care today.    Jonnie Finner, DO [facility] Pager [phone]  If 7PM-7AM, please contact night-coverage [email or web address] Password [facility] 05/29/2019, 11:35 AM

## 2019-05-30 DIAGNOSIS — J9601 Acute respiratory failure with hypoxia: Secondary | ICD-10-CM

## 2019-05-30 DIAGNOSIS — R6520 Severe sepsis without septic shock: Secondary | ICD-10-CM

## 2019-05-30 LAB — CBC WITH DIFFERENTIAL/PLATELET
Abs Immature Granulocytes: 0.15 10*3/uL — ABNORMAL HIGH (ref 0.00–0.07)
Basophils Absolute: 0 10*3/uL (ref 0.0–0.1)
Basophils Relative: 0 %
Eosinophils Absolute: 0 10*3/uL (ref 0.0–0.5)
Eosinophils Relative: 0 %
HCT: 28.1 % — ABNORMAL LOW (ref 39.0–52.0)
Hemoglobin: 8.7 g/dL — ABNORMAL LOW (ref 13.0–17.0)
Immature Granulocytes: 1 %
Lymphocytes Relative: 2 %
Lymphs Abs: 0.2 10*3/uL — ABNORMAL LOW (ref 0.7–4.0)
MCH: 30.7 pg (ref 26.0–34.0)
MCHC: 31 g/dL (ref 30.0–36.0)
MCV: 99.3 fL (ref 80.0–100.0)
Monocytes Absolute: 0.4 10*3/uL (ref 0.1–1.0)
Monocytes Relative: 3 %
Neutro Abs: 12.9 10*3/uL — ABNORMAL HIGH (ref 1.7–7.7)
Neutrophils Relative %: 94 %
Platelets: 135 10*3/uL — ABNORMAL LOW (ref 150–400)
RBC: 2.83 MIL/uL — ABNORMAL LOW (ref 4.22–5.81)
RDW: 19.3 % — ABNORMAL HIGH (ref 11.5–15.5)
WBC: 13.6 10*3/uL — ABNORMAL HIGH (ref 4.0–10.5)
nRBC: 0.5 % — ABNORMAL HIGH (ref 0.0–0.2)

## 2019-05-30 LAB — RENAL FUNCTION PANEL
Albumin: 3.1 g/dL — ABNORMAL LOW (ref 3.5–5.0)
Anion gap: 8 (ref 5–15)
BUN: 28 mg/dL — ABNORMAL HIGH (ref 8–23)
CO2: 25 mmol/L (ref 22–32)
Calcium: 8.5 mg/dL — ABNORMAL LOW (ref 8.9–10.3)
Chloride: 103 mmol/L (ref 98–111)
Creatinine, Ser: 0.76 mg/dL (ref 0.61–1.24)
GFR calc Af Amer: >60 mL/min (ref >60)
GFR calc non Af Amer: >60 mL/min (ref >60)
Glucose, Bld: 202 mg/dL — ABNORMAL HIGH (ref 70–99)
Phosphorus: 3 mg/dL (ref 2.5–4.6)
Potassium: 4.5 mmol/L (ref 3.5–5.1)
Sodium: 136 mmol/L (ref 135–145)

## 2019-05-30 LAB — MAGNESIUM: Magnesium: 2.5 mg/dL — ABNORMAL HIGH (ref 1.7–2.4)

## 2019-05-30 MED ORDER — METHYLPREDNISOLONE SODIUM SUCC 125 MG IJ SOLR
60.0000 mg | Freq: Two times a day (BID) | INTRAMUSCULAR | Status: DC
  Administered 2019-05-30 – 2019-06-02 (×6): 60 mg via INTRAVENOUS
  Filled 2019-05-30 (×6): qty 2

## 2019-05-30 MED ORDER — FUROSEMIDE 40 MG PO TABS
40.0000 mg | ORAL_TABLET | Freq: Every day | ORAL | Status: DC
  Administered 2019-05-30: 40 mg via ORAL
  Filled 2019-05-30: qty 1

## 2019-05-30 NOTE — Progress Notes (Signed)
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  Patient states that he started having worsening shortness of breath, fever and chills since this afternoon. He had temperature 102 at home. He has dry cough, denies chest pain, runny nose or sore throat. Per report, he had oxygen desaturation to 70s% on nonrebreather, which improved to 98% on BiPAP. Patient denies nausea,vomiting, diarrhea, abdominal pain, symptoms of UTI. He denies dark stool or rectal bleeding currently. Of note, patient has history of GI bleeding, andgetting frequent blood transfusion. He refused GI evaluation recently.  Assessment & Plan:   Principal Problem:   Acute on chronic respiratory failure with hypoxia and hypercapnia (HCC) Active Problems:   Malignant neoplasm of bladder (HCC)   HYPERCHOLESTEROLEMIA   Anxiety   Essential hypertension   Macrocytic anemia   COPD with acute exacerbation (HCC)   Chronic diastolic CHF (congestive heart failure) (HCC)   DNR (do not resuscitate)   Elevated troponin   OSA (obstructive sleep apnea)   Sepsis (HCC)   COPD exacerbation (HCC)   Shortness of breath   Acute on chronic respiratory failure with hypoxia and hypercapnia due to COPD exacerbation:     - COVID testing negative     - elevated d-dimer; CTA negative for PE     - Most recent ABG: ph 7.391, pCO2 41, pO2 157     - Was on vanc/rocephin, currently on ampicillin     - Inhaler: Atrovent inhaler, prn  Xopenex inhaler     - Continue with Solu-Medrol 60 mg IV tid     - Mucinex for cough      - Incentive spirometry     -  Urine S. pneumococcal antigen, resp viral panel, sputum Cx all pending     - f/u LE to r/o DVT     - Pt reports being on 6LNC at home, states historically weans to [facility] in hospital     - Minimal wheezing at present. Will wean solumedrol and wean O2 as tolerated  Sepsis from e coli bacteremia/GPR bacteremia, present on admit     - source unclear, but likely necrotic tumor     - leukocytosis, fever, tachycardia, tachypnea     - BP responsive to fluids     - CXR w/ metastatic disease/COPD     - Bld Cx +GPR/e. coli x 2     - procal: 4     - lactic acid: 2.8 -> 2.6 -> 3.2 -> 5.4 -> 3.2 -> 2.8     - Hx of HF, giving fluids, but must monitor     - Currently on ampicillin  Malignant neoplasm of bladder     - CTA showed an enlarged tumor size, hepatic metastases, progressive pulmonary metastasis.       - Patient is s/p radiation therapy, no surgery and chemotherapy per patient.     - Palliative consult: "DNR DNI. Home with continuation of home based palliative care. Patient doesn't want hospice, doesn't want to talk about hospice services.  Continue current mode of care."      - Today, patient states "I will get over this," referring to infection related to malignancy  HLD     -  Continue with zocor as toleated  Anxiety:     - xanax  Essential hypertension:      - Blood pressures are soft     - resumed metoprolol  Macrocytic anemia:      - Hemoglobin 7.6 (7.8 on 05/07/2019, 8.3 on 05/06/2019).       - Denies rectal bleeding or dark stool.      - Pt had symptomatic anemia which required 2 units pRBC transfusion. Pt has possible GI loss, but he denies declined evaluation by surgery or GI     - his plan is for intermittent transfusions going forward as needed     - hgb of 6.7 on 7/13, improved to over 8 with one unit PRBC  Chronic diastolic CHF:      - BNP 161, CXR did not show pulmonary edema.     - Lasix remains on hold at this time.      - BP stable, pt tolerating diet. Will  resume home lasix given recent fluid challenge  OSA:     - continued on BiPAP  Elevated troponin:      - Troponins are minimally elevated, 17/20.       - Denies any chest pain most likely due to demand ischemia secondary to sepsis and hypoxia.     - No aspirin due to possible GI bleeding     - Continue Zocor as tolerated     - PRN nitroglycerin  DVT prophylaxis: SCD's, will order Code Status: DNR Family Communication: Pt in room, family not at bedside Disposition Plan: Uncertain at this time  Consultants:   Palliative Care  Procedures:     Antimicrobials: Anti-infectives (From admission, onward)   Start     Dose/Rate Route Frequency Ordered Stop   05/29/19 1430  ampicillin (OMNIPEN) 2 g in sodium chloride 0.9 % 100 mL IVPB     2 g 300 mL/hr over 20 Minutes Intravenous Every 4 hours 05/29/19 1140     05/28/19 0300  vancomycin (VANCOCIN) 1,250 mg in sodium chloride 0.9 % 250 mL IVPB  Status:  Discontinued     1,250 mg 166.7 mL/hr over 90 Minutes Intravenous Daily 05/27/19 0236 05/29/19 0711   05/27/19 1600  cefTRIAXone (ROCEPHIN) 2 g in sodium chloride 0.9 % 100 mL IVPB  Status:  Discontinued     2 g 200 mL/hr over 30 Minutes Intravenous Daily-1800 05/27/19 1542 05/29/19 1140   05/27/19 0800  ceFEPIme (MAXIPIME) 2 g in sodium chloride 0.9 % 100 mL IVPB  Status:  Discontinued     2 g 200 mL/hr over 30 Minutes Intravenous Every 8 hours 05/27/19 0236 05/27/19 1542   05/26/19 2345  vancomycin (VANCOCIN) 2,000 mg in sodium chloride 0.9 % 500 mL IVPB     2,000 mg 250 mL/hr over 120 Minutes Intravenous  Once 05/26/19 2339 05/27/19 0201   05/26/19 2330  ceFEPIme (MAXIPIME) 2 g in sodium chloride 0.9 % 100 mL IVPB     2 g 200 mL/hr over 30 Minutes Intravenous  Once 05/26/19 2326 05/27/19 0046   05/26/19 2330  metroNIDAZOLE (FLAGYL) IVPB 500 mg     500 mg 100 mL/hr over 60 Minutes Intravenous  Once 05/26/19 2326 05/27/19 0151   05/26/19 2330  vancomycin (VANCOCIN) IVPB 1000  mg/200 mL premix  Status:  Discontinued     1,000 mg 200 mL/hr over 60 Minutes Intravenous  Once 05/26/19 2326 05/26/19 2339       Subjective: Eager to go home soon  Objective: Vitals:   05/30/19 0800 05/30/19 0842 05/30/19 0900 05/30/19 1200  BP:   (!) 141/75   Pulse:   84   Resp:   19   Temp: (!) 97.4 F (36.3 C)   97.6 F (36.4 C)  TempSrc: Oral   Oral  SpO2:  99% 99%   Weight:      Height:        Intake/Output Summary (Last 24 hours) at 05/30/2019 1447 Last data filed at 05/30/2019 1330 Gross per 24 hour  Intake 800.07 ml  Output 1625 ml  Net -824.93 ml   Filed Weights   05/26/19 2306 05/28/19 0627 05/29/19 0610  Weight: 90.7 kg 92.7 kg 92.5 kg    Examination:  General exam: Appears calm and comfortable  Respiratory system: Clear to auscultation. Respiratory effort normal. Decreased BS Cardiovascular system: S1 & S2 heard, RRR. Gastrointestinal system: Abdomen is nondistended, soft and nontender. No organomegaly or masses felt. Normal bowel sounds heard. Central nervous system: Alert and oriented. No focal neurological deficits. Extremities: Symmetric 5 x 5 power. Skin: No rashes, lesions Psychiatry: Judgement and insight appear normal. Mood & affect appropriate.   Data Reviewed: I have personally reviewed following labs and imaging studies  CBC: Recent Labs  Lab 05/26/19 2321 05/28/19 0239 05/28/19 1728 05/29/19 0722 05/30/19 0237  WBC 10.7* 13.0*  --  13.3* 13.6*  NEUTROABS 9.7* 11.6*  --  12.2* 12.9*  HGB 7.6* 6.7* 8.7* 8.3* 8.7*  HCT 25.3* 21.8* 27.7* 25.9* 28.1*  MCV 100.0 98.6  --  97.7 99.3  PLT 199 137*  --  124* 989*   Basic Metabolic Panel: Recent Labs  Lab 05/26/19 2321 05/28/19 0239 05/29/19 0722 05/30/19 0237  NA 136 135 135 136  K 3.5 4.3 4.4 4.5  CL 101 102 103 103  CO2 24 22 23 25   GLUCOSE 148* 183* 197* 202*  BUN 22 30* 26* 28*  CREATININE 1.00 0.82 0.75 0.76  CALCIUM 8.3* 8.0* 8.2* 8.5*  MG  --  2.0 2.5* 2.5*  PHOS   --  3.2 3.1 3.0   GFR: Estimated Creatinine Clearance: 86.3 mL/min (by C-G formula based on SCr of 0.76 mg/dL). Liver Function Tests: Recent Labs  Lab 05/26/19 2321 05/28/19 0239 05/29/19 0722 05/30/19 0237  AST 59*  --   --   --   ALT 47*  --   --   --   ALKPHOS 119  --   --   --   BILITOT 4.4*  --   --   --   PROT 6.8  --   --   --   ALBUMIN 3.3* 3.1* 2.9* 3.1*   Recent Labs  Lab 05/26/19 2321  LIPASE 31   No results for input(s): AMMONIA in the last 168 hours. Coagulation Profile: Recent Labs  Lab 05/26/19 2321  INR 1.2   Cardiac Enzymes: No results for input(s): CKTOTAL, CKMB, CKMBINDEX, TROPONINI in the last 168 hours. BNP (last 3 results) No results for input(s): PROBNP in the last 8760 hours. HbA1C: No results for input(s): HGBA1C in the last 72 hours. CBG: No results for input(s): GLUCAP in the last 168 hours. Lipid Profile: Recent Labs    05/28/19 0237  CHOL 106  HDL 23*  LDLCALC 66  TRIG 83  CHOLHDL 4.6   Thyroid Function Tests: No results for input(s): TSH, T4TOTAL, FREET4, T3FREE, THYROIDAB in the last 72 hours. Anemia Panel: No results for input(s): VITAMINB12, FOLATE, FERRITIN, TIBC, IRON, RETICCTPCT in the  last 72 hours. Sepsis Labs: Recent Labs  Lab 05/26/19 2321  05/27/19 1216 05/27/19 1446 05/28/19 1009 05/28/19 1249  PROCALCITON 4.29  --   --   --   --   --   LATICACIDVEN 2.8*   < > 3.2* 5.4* 3.2* 2.8*   < > = values in this interval not displayed.    Recent Results (from the past 240 hour(s))  SARS Coronavirus 2 (CEPHEID- Performed in [place] hospital lab), Hosp Order     Status: None   Collection Time: 05/26/19 11:28 PM   Specimen: Nasopharyngeal Swab  Result Value Ref Range Status   SARS Coronavirus 2 NEGATIVE NEGATIVE Final    Comment: (NOTE) If result is NEGATIVE SARS-CoV-2 target nucleic acids are NOT DETECTED. The SARS-CoV-2 RNA is generally detectable in upper and lower  respiratory specimens during the acute  phase of infection. The lowest  concentration of SARS-CoV-2 viral copies this assay can detect is 250  copies / mL. A negative result does not preclude SARS-CoV-2 infection  and should not be used as the sole basis for treatment or other  patient management decisions.  A negative result may occur with  improper specimen collection / handling, submission of specimen other  than nasopharyngeal swab, presence of viral mutation(s) within the  areas targeted by this assay, and inadequate number of viral copies  (<250 copies / mL). A negative result must be combined with clinical  observations, patient history, and epidemiological information. If result is POSITIVE SARS-CoV-2 target nucleic acids are DETECTED. The SARS-CoV-2 RNA is generally detectable in upper and lower  respiratory specimens dur ing the acute phase of infection.  Positive  results are indicative of active infection with SARS-CoV-2.  Clinical  correlation with patient history and other diagnostic information is  necessary to determine patient infection status.  Positive results do  not rule out bacterial infection or co-infection with other viruses. If result is PRESUMPTIVE POSTIVE SARS-CoV-2 nucleic acids MAY BE PRESENT.   A presumptive positive result was obtained on the submitted specimen  and confirmed on repeat testing.  While 2019 novel coronavirus  (SARS-CoV-2) nucleic acids may be present in the submitted sample  additional confirmatory testing may be necessary for epidemiological  and / or clinical management purposes  to differentiate between  SARS-CoV-2 and other Sarbecovirus currently known to infect humans.  If clinically indicated additional testing with an alternate test  methodology [phone]) is advised. The SARS-CoV-2 RNA is generally  detectable in upper and lower respiratory sp ecimens during the acute  phase of infection. The expected result is Negative. Fact Sheet for Patients:   StrictlyIdeas.no Fact Sheet for Healthcare Providers: BankingDealers.co.za This test is not yet approved or cleared by the [place] FDA and has been authorized for detection and/or diagnosis of SARS-CoV-2 by FDA under an Emergency Use Authorization (EUA).  This EUA will remain in effect (meaning this test can be used) for the duration of the COVID-19 declaration under Section 564(b)(1) of the Act, 21 U.S.C. section 360bbb-3(b)(1), unless the authorization is terminated or revoked sooner. Performed at [facility], [place] [address], [place], [place] [address]   Blood Culture (routine x 2)     Status: Abnormal   Collection Time: 05/26/19 11:29 PM   Specimen: BLOOD  Result Value Ref Range Status   Specimen Description BLOOD RIGHT ANTECUBITAL  Final   Special Requests   Final    BOTTLES DRAWN AEROBIC AND ANAEROBIC Blood Culture adequate volume  Culture  Setup Time   Final    GRAM NEGATIVE RODS IN BOTH AEROBIC AND ANAEROBIC BOTTLES CRITICAL VALUE NOTED.  VALUE IS CONSISTENT WITH PREVIOUSLY REPORTED AND CALLED VALUE. GRAM POSITIVE RODS ANAEROBIC BOTTLE ONLY Performed at [facility], [place] [address], [place], [place] [address]    Culture ESCHERICHIA COLI CLOSTRIDIUM PERFRINGENS  (A)  Final   Report Status 05/29/2019 FINAL  Final   Organism ID, Bacteria ESCHERICHIA COLI  Final      Susceptibility   Escherichia coli - MIC*    AMPICILLIN <=2 SENSITIVE Sensitive     CEFAZOLIN <=4 SENSITIVE Sensitive     CEFEPIME <=1 SENSITIVE Sensitive     CEFTAZIDIME <=1 SENSITIVE Sensitive     CEFTRIAXONE <=1 SENSITIVE Sensitive     CIPROFLOXACIN <=0.25 SENSITIVE Sensitive     GENTAMICIN <=1 SENSITIVE Sensitive     IMIPENEM <=0.25 SENSITIVE Sensitive     TRIMETH/SULFA <=20 SENSITIVE Sensitive     AMPICILLIN/SULBACTAM <=2 SENSITIVE Sensitive     PIP/TAZO <=4 SENSITIVE Sensitive     Extended ESBL NEGATIVE Sensitive     *  ESCHERICHIA COLI  Blood Culture (routine x 2)     Status: Abnormal   Collection Time: 05/26/19 11:34 PM   Specimen: BLOOD  Result Value Ref Range Status   Specimen Description BLOOD LEFT ANTECUBITAL  Final   Special Requests   Final    BOTTLES DRAWN AEROBIC AND ANAEROBIC Blood Culture adequate volume   Culture  Setup Time   Final    GRAM NEGATIVE RODS IN BOTH AEROBIC AND ANAEROBIC BOTTLES CRITICAL RESULT CALLED TO, READ BACK BY AND VERIFIED WITH: PHARMD A. PHAM [id number] FCP GRAM POSITIVE RODS ANAEROBIC BOTTLE ONLY Performed at [facility], [place] [address] [place], [place] [address]    Culture ESCHERICHIA COLI CLOSTRIDIUM PERFRINGENS  (A)  Final   Report Status 05/29/2019 FINAL  Final  Blood Culture ID Panel (Reflexed)     Status: Abnormal   Collection Time: 05/26/19 11:34 PM  Result Value Ref Range Status   Enterococcus species NOT DETECTED NOT DETECTED Final   Listeria monocytogenes NOT DETECTED NOT DETECTED Final   Staphylococcus species NOT DETECTED NOT DETECTED Final   Staphylococcus aureus (BCID) NOT DETECTED NOT DETECTED Final   Streptococcus species NOT DETECTED NOT DETECTED Final   Streptococcus agalactiae NOT DETECTED NOT DETECTED Final   Streptococcus pneumoniae NOT DETECTED NOT DETECTED Final   Streptococcus pyogenes NOT DETECTED NOT DETECTED Final   Acinetobacter baumannii NOT DETECTED NOT DETECTED Final   Enterobacteriaceae species DETECTED (A) NOT DETECTED Final    Comment: Enterobacteriaceae represent a large family of gram-negative bacteria, not a single organism. CRITICAL RESULT CALLED TO, READ BACK BY AND VERIFIED WITH: PHARMD A. PHAM [id number] FCP    Enterobacter cloacae complex NOT DETECTED NOT DETECTED Final   Escherichia coli DETECTED (A) NOT DETECTED Final    Comment: CRITICAL RESULT CALLED TO, READ BACK BY AND VERIFIED WITH: PHARMD A. PHAM 1420 [id number] FCP    Klebsiella oxytoca NOT DETECTED NOT DETECTED Final   Klebsiella pneumoniae NOT  DETECTED NOT DETECTED Final   Proteus species NOT DETECTED NOT DETECTED Final   Serratia marcescens NOT DETECTED NOT DETECTED Final   Carbapenem resistance NOT DETECTED NOT DETECTED Final   Haemophilus influenzae NOT DETECTED NOT DETECTED Final   Neisseria meningitidis NOT DETECTED NOT DETECTED Final   Pseudomonas aeruginosa NOT DETECTED NOT DETECTED Final   Candida albicans NOT DETECTED NOT DETECTED Final  Candida glabrata NOT DETECTED NOT DETECTED Final   Candida krusei NOT DETECTED NOT DETECTED Final   Candida parapsilosis NOT DETECTED NOT DETECTED Final   Candida tropicalis NOT DETECTED NOT DETECTED Final    Comment: Performed at [facility], [place] [address] [place], [place] [address]  Urine culture     Status: None   Collection Time: 05/27/19  1:54 AM   Specimen: In/Out Cath Urine  Result Value Ref Range Status   Specimen Description   Final    IN/OUT CATH URINE Performed at [place] [address], [place], [place] [address]    Special Requests   Final    NONE Performed at [facility], [place] [address], [place], [place] [address]    Culture   Final    NO GROWTH Performed at [facility], [place] [address], [place], [place] [address]    Report Status 05/28/2019 FINAL  Final  MRSA PCR Screening     Status: None   Collection Time: 05/27/19  5:29 AM   Specimen: Nasal Mucosa; Nasopharyngeal  Result Value Ref Range Status   MRSA by PCR NEGATIVE NEGATIVE Final    Comment:        The GeneXpert MRSA Assay (FDA approved for NASAL specimens only), is one component of a comprehensive MRSA colonization surveillance program. It is not intended to diagnose MRSA infection nor to guide or monitor treatment for MRSA infections. Performed at [facility], [place] [address], [place], [place] [address]   Respiratory Panel by PCR     Status: None   Collection Time: 05/28/19 12:30 AM   Specimen: Nasopharyngeal Swab;  Respiratory  Result Value Ref Range Status   Adenovirus NOT DETECTED NOT DETECTED Final   Coronavirus 229E NOT DETECTED NOT DETECTED Final    Comment: (NOTE) The Coronavirus on the Respiratory Panel, DOES NOT test for the novel  Coronavirus (2019 nCoV)    Coronavirus HKU1 NOT DETECTED NOT DETECTED Final   Coronavirus NL63 NOT DETECTED NOT DETECTED Final   Coronavirus OC43 NOT DETECTED NOT DETECTED Final   Metapneumovirus NOT DETECTED NOT DETECTED Final   Rhinovirus / Enterovirus NOT DETECTED NOT DETECTED Final   Influenza A NOT DETECTED NOT DETECTED Final   Influenza B NOT DETECTED NOT DETECTED Final   Parainfluenza Virus 1 NOT DETECTED NOT DETECTED Final   Parainfluenza Virus 2 NOT DETECTED NOT DETECTED Final   Parainfluenza Virus 3 NOT DETECTED NOT DETECTED Final   Parainfluenza Virus 4 NOT DETECTED NOT DETECTED Final   Respiratory Syncytial Virus NOT DETECTED NOT DETECTED Final   Bordetella pertussis NOT DETECTED NOT DETECTED Final   Chlamydophila pneumoniae NOT DETECTED NOT DETECTED Final   Mycoplasma pneumoniae NOT DETECTED NOT DETECTED Final    Comment: Performed at [facility], [place]. [address] [place], [place] [address]  Culture, blood (routine x 2)     Status: None (Preliminary result)   Collection Time: 05/29/19  4:07 PM   Specimen: BLOOD  Result Value Ref Range Status   Specimen Description   Final    BLOOD LEFT ANTECUBITAL Performed at [facility] [address] [place], [place] [address]    Special Requests   Final    BOTTLES DRAWN AEROBIC ONLY Blood Culture adequate volume Performed at [place] [address], [place],  [address]    Culture   Final    NO GROWTH < 24 HOURS Performed at [facility], [place] [address] [place], [place] [address]    Report Status PENDING  Incomplete  Culture, blood (routine x 2)     Status: None (Preliminary result)   Collection Time: 05/29/19  4:16 PM   Specimen:  BLOOD RIGHT HAND  Result Value Ref Range Status   Specimen Description   Final    BLOOD RIGHT HAND Performed at [place] [facility] [address] [place], [place] [address]    Special Requests   Final    BOTTLES DRAWN AEROBIC ONLY Blood Culture results may not be optimal due to an inadequate volume of blood received in culture bottles Performed at [facility] [address], [place], [place] [address]    Culture   Final    NO GROWTH < 24 HOURS Performed at [facility] [address] [place], [place] [address]    Report Status PENDING  Incomplete     Radiology Studies: No results found.  Scheduled Meds: . ALPRAZolam  0.5 mg Oral QHS  . budesonide  0.5 mg Nebulization BID  . chlorhexidine  15 mL Mouth Rinse BID  . Chlorhexidine Gluconate Cloth  6 each Topical Daily  . cholecalciferol  400 Units Oral QHS  . ipratropium-albuterol  3 mL Nebulization Q4H  . mouth rinse  15 mL Mouth Rinse q12n4p  . methocarbamol  1,000 mg Oral QHS  . methylPREDNISolone (SOLU-MEDROL) injection  60 mg Intravenous TID  . metoprolol tartrate  25 mg Oral BID  . multivitamin  1 tablet Oral BID  . pantoprazole  40 mg Oral Daily  . simvastatin  20 mg Oral q1800  . vitamin B-12  1,000 mcg Oral Daily  . vitamin C  1,000 mg Oral Daily   Continuous Infusions: . sodium chloride Stopped (05/28/19 0209)  . ampicillin (OMNIPEN) IV Stopped (05/30/19 1242)     LOS: 3 days   Marylu Lund, MD [facility] Pager On Amion  If 7PM-7AM, please contact night-coverage 05/30/2019, 2:47 PM

## 2019-05-30 NOTE — Progress Notes (Signed)
Daily Progress Note   Patient Name: [name]       Date: 05/30/2019 DOB: 15-Nov-1946  Age: 73 y.o. MRN#: [id number] Attending Physician: Donne Hazel, MD Primary Care Physician: [name], MD Admit Date: 05/26/2019  Reason for Consultation/Follow-up: Establishing goals of care  Subjective: I saw and examined [name] today.  He remembers me from prior encounter several months ago.  We discussed his clinical course and continued changes he has seen in his functional status and overall health since I last saw him in April in detail.  He reports that the care team has been doing a good job explaining things to him, but he is frustrated by "negative talk."  He is clear that he does not want to discuss end of life and is not interested in hospice services.  He reports he may be open to hospice "in the future, but God is not through with me yet."   Length of Stay: 3  Current Medications: Scheduled Meds:  . ALPRAZolam  0.5 mg Oral QHS  . budesonide  0.5 mg Nebulization BID  . chlorhexidine  15 mL Mouth Rinse BID  . Chlorhexidine Gluconate Cloth  6 each Topical Daily  . cholecalciferol  400 Units Oral QHS  . furosemide  40 mg Oral Daily  . ipratropium-albuterol  3 mL Nebulization Q4H  . mouth rinse  15 mL Mouth Rinse q12n4p  . methocarbamol  1,000 mg Oral QHS  . methylPREDNISolone (SOLU-MEDROL) injection  60 mg Intravenous Q12H  . metoprolol tartrate  25 mg Oral BID  . multivitamin  1 tablet Oral BID  . pantoprazole  40 mg Oral Daily  . simvastatin  20 mg Oral q1800  . vitamin B-12  1,000 mcg Oral Daily  . vitamin C  1,000 mg Oral Daily    Continuous Infusions: . sodium chloride Stopped (05/28/19 0209)  . ampicillin (OMNIPEN) IV Stopped (05/30/19 1607)    PRN Meds:  acetaminophen, albuterol, dextromethorphan-guaiFENesin, fluticasone, LORazepam, nitroGLYCERIN, ondansetron, polyvinyl alcohol, sodium chloride, traMADol  Physical Exam     General: Alert, awake, in no acute distress. Sitting in bedside chair Heart: Regular rate and rhythm. No murmur appreciated. Lungs: Good air movement, clear Abdomen: Soft, nontender, nondistended, positive bowel sounds.  Ext: No significant edema Skin: Warm and dry Neuro: Grossly intact, nonfocal.  Vital Signs: BP (!) 150/76   Pulse 73   Temp 97.9 F (36.6 C) (Oral)   Resp (!) 21   Ht 5\' 6"  (1.676 m)   Wt 92.5 kg   SpO2 98%   BMI 32.91 kg/m  SpO2: SpO2: 98 % O2 Device: O2 Device: Nasal Cannula O2 Flow Rate: O2 Flow Rate (L/min): 6 L/min  Intake/output summary:   Intake/Output Summary (Last 24 hours) at 05/30/2019 1831 Last data filed at 05/30/2019 1800 Gross per 24 hour  Intake 800.16 ml  Output 1575 ml  Net -774.84 ml   LBM: Last BM Date: 05/30/19 Baseline Weight: Weight: 90.7 kg Most recent weight: Weight: 92.5 kg       Palliative Assessment/Data:      Patient Active Problem List   Diagnosis Date Noted  . Sepsis ([place]) 05/27/2019  . COPD exacerbation ([place]) 05/27/2019  . Shortness of breath   . Symptomatic anemia 05/06/2019  . Anemia of chronic disease   . OSA (obstructive sleep apnea)   . SBO (small bowel obstruction) ([place]) 03/27/2019  . Nausea vomiting and diarrhea   . Weakness   . SOB (shortness of breath)   . Elevated troponin   . Palliative care by specialist   . Goals of care, counseling/discussion   . Abdominal pain   . GI bleed 02/18/2019  . DNR (do not resuscitate) 12/21/2018  . Anemia 12/14/2018  . Liver metastasis ([place]) 07/13/2018  . Acute blood loss anemia 06/07/2018  . Rectal bleeding 05/13/2018  . Chronic diastolic CHF (congestive heart failure) ([place]) 05/13/2018  . Lower GI bleed 05/13/2018  . Class 2 obesity with body mass index (BMI) of 37.0 to 37.9 in adult  01/10/2018  . 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 Acute on chronic diastolic CHF (congestive heart failure) ([place]) 01/19/2017  . Chronic respiratory failure with hypoxia and hypercapnia ([place]) 10/13/2016  . OSA treated with BiPAP 10/13/2016  . Macrocytic anemia 09/24/2016  . COPD with acute exacerbation ([place]) 09/24/2016  . Acute bronchitis 02/03/2016  . Ex-smoker 10/31/2015  . Dilated cardiomyopathy ([place])   . Pulmonary hypertension ([place])   . Acute on chronic respiratory failure with hypoxia and hypercapnia ([place]) 10/20/2015  . 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 LBP (low back pain) 03/24/2011  . Malignant neoplasm of bladder ([place]) 09/22/2010  . HEMATURIA UNSPECIFIED 05/14/2010  . Osteoarthritis 03/28/2010  . Coronary atherosclerosis of native coronary artery 12/19/2007  . Acute sinusitis 12/19/2007  . 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 COLONIC POLYPS 12/18/2007  . HYPERCHOLESTEROLEMIA 12/18/2007  . Anxiety 12/18/2007  . Essential hypertension 12/18/2007  . GASTRITIS 12/18/2007  . 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 Sepsis ([place]) 05/27/2019  . COPD exacerbation ([place]) 05/27/2019  . Shortness of breath   . Symptomatic anemia 05/06/2019  . Anemia of chronic disease   . OSA (obstructive sleep apnea)   . SBO (small bowel obstruction) ([place]) 03/27/2019  . Nausea vomiting and diarrhea   . Weakness   . SOB (shortness of breath)   . Elevated troponin   . Palliative care by specialist   . Goals of care, counseling/discussion   . Abdominal pain   . GI bleed 02/18/2019  . DNR (do not  resuscitate) 12/21/2018  . Anemia 12/14/2018  . Liver metastasis ([place]) 07/13/2018  . Acute blood loss anemia 06/07/2018  .  Rectal bleeding 05/13/2018  . Chronic diastolic CHF (congestive heart failure) ([place]) 05/13/2018  . Lower GI bleed 05/13/2018  . Class 2 obesity with body mass index (BMI) of 37.0 to 37.9 in adult 01/10/2018  . 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 Acute on chronic diastolic CHF (congestive heart failure) ([place]) 01/19/2017  . Chronic respiratory failure with hypoxia and hypercapnia ([place]) 10/13/2016  . OSA treated with BiPAP 10/13/2016  . Macrocytic anemia 09/24/2016  . COPD with acute exacerbation ([place]) 09/24/2016  . Acute bronchitis 02/03/2016  . Ex-smoker 10/31/2015  . Dilated cardiomyopathy ([place])   . Pulmonary hypertension ([place])   . Acute on chronic respiratory failure with hypoxia and hypercapnia ([place]) 10/20/2015  . 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 LBP (low back pain) 03/24/2011  . Malignant neoplasm of bladder ([place]) 09/22/2010  . HEMATURIA UNSPECIFIED 05/14/2010  . Osteoarthritis 03/28/2010  . Coronary atherosclerosis of native coronary artery 12/19/2007  . Acute sinusitis 12/19/2007  . 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 COLONIC POLYPS 12/18/2007  . HYPERCHOLESTEROLEMIA 12/18/2007  . Anxiety 12/18/2007  . Essential hypertension 12/18/2007  . GASTRITIS 12/18/2007  . Impaired fasting glucose 12/18/2007    Recommendations/Plan:  DNR/DNI but desires continuation of current care until time of cardiac or respiratory arrest.  Not open to consideration for hospice at this time.  Will continue to gently progress conversation based on clinical course and his emotional ability to progress conversation.  Code Status:    Code Status Orders  (From admission, onward)         Start     Ordered   05/27/19 0355  Do not attempt resuscitation (DNR)  Continuous    Question Answer Comment  In the event of cardiac or respiratory ARREST Do not call a "code blue"   In the event of cardiac or respiratory ARREST Do not perform Intubation, CPR, defibrillation or ACLS   In  the event of cardiac or respiratory ARREST Use medication by any route, position, wound care, and other measures to relive pain and suffering. May use oxygen, suction and manual treatment of airway obstruction as needed for comfort.      05/27/19 0354        Code Status History    Date Active Date Inactive Code Status Order ID Comments User Context   05/27/2019 0229 05/27/2019 0354 DNR [id number]  Ivor Costa, MD ED   05/06/2019 1220 05/07/2019 1359 DNR [id number]  Elodia Florence., MD Inpatient   05/06/2019 0422 05/06/2019 1220 Full Code [id number]  Hugelmeyer, [place], DO Inpatient   04/23/2019 1510 04/24/2019 2219 DNR [id number]  Mendel Corning, MD Inpatient   03/27/2019 1738 03/30/2019 1830 DNR [id number]  Georgette Shell, MD ED   02/18/2019 1539 02/21/2019 2028 Partial Code [id number]  Kayleen Memos, DO ED   02/18/2019 1527 02/18/2019 1539 DNR [id number]  Kayleen Memos, DO ED   02/18/2019 1519 02/18/2019 1527 Full Code [id number]  Kayleen Memos, DO ED   12/14/2018 2025 12/15/2018 2042 DNR [id number]  Elmarie Shiley, MD Inpatient   06/07/2018 2032 06/12/2018 0045 Full Code [id number]  Etta Quill, DO ED   05/13/2018 2145 05/18/2018 2157 Full Code [id number]  Toy Baker, MD Inpatient   01/19/2017 1957 01/26/2017 1550 Full Code [id number]  Rama, Venetia Maxon, MD ED   09/24/2016 0246 09/29/2016 2000 Full Code [id number]  Vianne Bulls, MD ED   10/20/2015 1934 10/29/2015 1653 Full Code [id number]  Mendel Corning, MD Inpatient   Advance Care Planning Activity       Prognosis:   Guarded  Discharge Planning:  To be determined- ? Home with Palliative Services  Care plan was discussed with patient  Thank you for allowing the Palliative Medicine Team to assist in the care of this patient.   Time In: 1800 Time Out: 1840 Total Time 40 Prolonged Time Billed No      Greater than 50%  of this time was spent counseling and coordinating care related to the above assessment and plan.  Micheline Rough, MD   Please contact Palliative Medicine Team phone at [phone] for questions and concerns.

## 2019-05-31 LAB — COMPREHENSIVE METABOLIC PANEL
ALT: 97 U/L — ABNORMAL HIGH (ref 0–44)
AST: 67 U/L — ABNORMAL HIGH (ref 15–41)
Albumin: 2.8 g/dL — ABNORMAL LOW (ref 3.5–5.0)
Alkaline Phosphatase: 130 U/L — ABNORMAL HIGH (ref 38–126)
Anion gap: 10 (ref 5–15)
BUN: 27 mg/dL — ABNORMAL HIGH (ref 8–23)
CO2: 24 mmol/L (ref 22–32)
Calcium: 7.9 mg/dL — ABNORMAL LOW (ref 8.9–10.3)
Chloride: 99 mmol/L (ref 98–111)
Creatinine, Ser: 0.87 mg/dL (ref 0.61–1.24)
GFR calc Af Amer: >60 mL/min (ref >60)
GFR calc non Af Amer: >60 mL/min (ref >60)
Glucose, Bld: 197 mg/dL — ABNORMAL HIGH (ref 70–99)
Potassium: 4.2 mmol/L (ref 3.5–5.1)
Sodium: 133 mmol/L — ABNORMAL LOW (ref 135–145)
Total Bilirubin: 1.1 mg/dL (ref 0.3–1.2)
Total Protein: 6.1 g/dL — ABNORMAL LOW (ref 6.5–8.1)

## 2019-05-31 LAB — CBC
HCT: 29.8 % — ABNORMAL LOW (ref 39.0–52.0)
Hemoglobin: 9.1 g/dL — ABNORMAL LOW (ref 13.0–17.0)
MCH: 30.2 pg (ref 26.0–34.0)
MCHC: 30.5 g/dL (ref 30.0–36.0)
MCV: 99 fL (ref 80.0–100.0)
Platelets: 143 10*3/uL — ABNORMAL LOW (ref 150–400)
RBC: 3.01 MIL/uL — ABNORMAL LOW (ref 4.22–5.81)
RDW: 18.3 % — ABNORMAL HIGH (ref 11.5–15.5)
WBC: 13.4 10*3/uL — ABNORMAL HIGH (ref 4.0–10.5)
nRBC: 0.6 % — ABNORMAL HIGH (ref 0.0–0.2)

## 2019-05-31 MED ORDER — FUROSEMIDE 10 MG/ML IJ SOLN
40.0000 mg | Freq: Two times a day (BID) | INTRAMUSCULAR | Status: DC
  Administered 2019-05-31 – 2019-06-01 (×4): 40 mg via INTRAVENOUS
  Filled 2019-05-31 (×5): qty 4

## 2019-05-31 NOTE — Progress Notes (Signed)
Assumed care of patient. Agree with previous RN assessment. Patient resting in bed with eyes closed on BiPAP. No signs of distress noted. Bed in the lowest locked position with patient belongings within reach. Will continue to monitor

## 2019-05-31 NOTE — Progress Notes (Signed)
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  Patient states that he started having worsening shortness of breath, fever and chills since this afternoon. He had temperature 102 at home. He has dry cough, denies chest pain, runny nose or sore throat. Per report, he had oxygen desaturation to 70s% on nonrebreather, which improved to 98% on BiPAP. Patient denies nausea,vomiting, diarrhea, abdominal pain, symptoms of UTI. He denies dark stool or rectal bleeding currently. Of note, patient has history of GI bleeding, andgetting frequent blood transfusion. He refused GI evaluation recently.  Assessment & Plan:   Principal Problem:   Acute on chronic respiratory failure with hypoxia and hypercapnia (HCC) Active Problems:   Malignant neoplasm of bladder (HCC)   HYPERCHOLESTEROLEMIA   Anxiety   Essential hypertension   Macrocytic anemia   COPD with acute exacerbation (HCC)   Chronic diastolic CHF (congestive heart failure) (HCC)   DNR (do not resuscitate)   Elevated troponin   OSA (obstructive sleep apnea)   Sepsis (HCC)   COPD exacerbation (HCC)   Shortness of breath   Acute on chronic respiratory failure with hypoxia and hypercapnia due to COPD exacerbation:     - COVID testing negative     - elevated d-dimer; CTA negative for PE     - Most recent ABG: ph 7.391, pCO2 41, pO2 157     - Was on vanc/rocephin, currently on ampicillin     - Inhaler: Atrovent inhaler, prn  Xopenex inhaler     - Continue with Solu-Medrol 60 mg IV tid     - Mucinex for cough      - Incentive spirometry     -  Urine S. pneumococcal antigen, resp viral panel, sputum Cx all pending     - f/u LE to r/o DVT     - Pt reports being on 6LNC at home, states historically weans to [facility] in hospital     - Minimal wheezing at present.     - Still sob and requiring bipap. Increased lasix per below  Sepsis from e coli bacteremia/GPR bacteremia, present on admit     - source unclear, but likely necrotic tumor     - leukocytosis, fever, tachycardia, tachypnea     - BP responsive to fluids     - CXR w/ metastatic disease/COPD     - Bld Cx +GPR/e. coli x 2     - procal: 4     - lactic acid: 2.8 -> 2.6 -> 3.2 -> 5.4 -> 3.2 -> 2.8     - Hx of HF, giving fluids, but must monitor     - Currently presently on ampicillin  Malignant neoplasm of bladder     - CTA showed an enlarged tumor size, hepatic metastases, progressive pulmonary metastasis.       - Patient is s/p radiation therapy, no surgery and chemotherapy per patient.     - Palliative consult: "DNR DNI. Home with continuation of home based palliative care. Patient doesn't want hospice, doesn't want to talk about hospice services.  Continue current mode of care."      - Seems stable this AM  HLD     -  Continue with zocor as toleated  Anxiety:     - xanax  Essential hypertension:      - BP stable this AM     - Continue current regimen as tolerated  Macrocytic anemia:      - Hemoglobin 7.6 (7.8 on 05/07/2019, 8.3 on 05/06/2019).       - Denies rectal bleeding or dark stool.      - Pt had symptomatic anemia which required 2 units pRBC transfusion. Pt has possible GI loss, but he denies declined evaluation by surgery or GI     - his plan is for intermittent transfusions going forward as needed     - hgb of 6.7 on 7/13, improved to over 8 with one unit PRBC     - Hemodynamically stable at present  Acute on Chronic diastolic CHF:      - BNP 229, CXR did not show pulmonary edema.     - Lasix remains on hold at this time.      - B LE edema noted on  exam     - Some improvement with PO lasix overnight     - Will increase to 40mg  IV lasix as tolerated  OSA:     - continued on BiPAP as tolerated  Elevated troponin:      - Troponins are minimally elevated, 17/20.       - Denies any chest pain most likely due to demand ischemia secondary to sepsis and hypoxia.     - No aspirin due to possible GI bleeding     - Continue Zocor as tolerated     - Continue with NTG as tolerated  DVT prophylaxis: SCD's Code Status: DNR Family Communication: Pt in room, family not at bedside Disposition Plan: Uncertain at this time  Consultants:   Palliative Care  Procedures:     Antimicrobials: Anti-infectives (From admission, onward)   Start     Dose/Rate Route Frequency Ordered Stop   05/29/19 1430  ampicillin (OMNIPEN) 2 g in sodium chloride 0.9 % 100 mL IVPB     2 g 300 mL/hr over 20 Minutes Intravenous Every 4 hours 05/29/19 1140     05/28/19 0300  vancomycin (VANCOCIN) 1,250 mg in sodium chloride 0.9 % 250 mL IVPB  Status:  Discontinued     1,250 mg 166.7 mL/hr over 90 Minutes Intravenous Daily 05/27/19 0236 05/29/19 0711   05/27/19 1600  cefTRIAXone (ROCEPHIN) 2 g in sodium chloride 0.9 % 100 mL IVPB  Status:  Discontinued     2 g 200 mL/hr over 30 Minutes Intravenous Daily-1800 05/27/19 1542 05/29/19 1140   05/27/19 0800  ceFEPIme (MAXIPIME) 2 g in sodium chloride 0.9 % 100 mL IVPB  Status:  Discontinued     2 g 200 mL/hr over 30 Minutes Intravenous Every 8 hours 05/27/19 0236 05/27/19 1542   05/26/19 2345  vancomycin (VANCOCIN) 2,000 mg in sodium chloride 0.9 % 500 mL IVPB     2,000 mg 250 mL/hr over 120 Minutes Intravenous  Once 05/26/19 2339 05/27/19 0201   05/26/19 2330  ceFEPIme (MAXIPIME) 2 g in sodium chloride 0.9 % 100 mL IVPB     2 g 200 mL/hr over 30 Minutes Intravenous  Once 05/26/19 2326 05/27/19 0046   05/26/19 2330  metroNIDAZOLE (FLAGYL) IVPB 500 mg     500 mg 100 mL/hr over 60 Minutes Intravenous  Once 05/26/19  2326 05/27/19 0151   05/26/19 2330  vancomycin (VANCOCIN) IVPB 1000 mg/200 mL premix  Status:  Discontinued     1,000 mg 200 mL/hr over 60 Minutes Intravenous  Once 05/26/19 2326 05/26/19 2339      Subjective: Reports some improvement with oral lasix overnight  Objective: Vitals:   05/31/19 1121 05/31/19 1200 05/31/19 1529 05/31/19 1600  BP:      Pulse: 67     Resp: 17     Temp:  98.2 F (36.8 C)  98 F (36.7 C)  TempSrc:  Oral  Oral  SpO2: 99%  99%   Weight:      Height:        Intake/Output Summary (Last 24 hours) at 05/31/2019 1628 Last data filed at 05/31/2019 1427 Gross per 24 hour  Intake 1200.08 ml  Output 3270 ml  Net -2069.92 ml   Filed Weights   05/26/19 2306 05/28/19 0627 05/29/19 0610  Weight: 90.7 kg 92.7 kg 92.5 kg    Examination: General exam: Awake, laying in bed, in nad Respiratory system: Normal respiratory effort, no wheezing Cardiovascular system: regular rate, s1, s2 Gastrointestinal system: Soft, nondistended, positive BS Central nervous system: CN2-12 grossly intact, strength intact Extremities: Perfused, no clubbing, Ble edema Skin: Normal skin turgor, no notable skin lesions seen Psychiatry: Mood normal // no visual hallucinations   Data Reviewed: I have personally reviewed following labs and imaging studies  CBC: Recent Labs  Lab 05/26/19 2321 05/28/19 0239 05/28/19 1728 05/29/19 0722 05/30/19 0237 05/31/19 0219  WBC 10.7* 13.0*  --  13.3* 13.6* 13.4*  NEUTROABS 9.7* 11.6*  --  12.2* 12.9*  --   HGB 7.6* 6.7* 8.7* 8.3* 8.7* 9.1*  HCT 25.3* 21.8* 27.7* 25.9* 28.1* 29.8*  MCV 100.0 98.6  --  97.7 99.3 99.0  PLT 199 137*  --  124* 135* 185*   Basic Metabolic Panel: Recent Labs  Lab 05/26/19 2321 05/28/19 0239 05/29/19 0722 05/30/19 0237 05/31/19 0219  NA 136 135 135 136 133*  K 3.5 4.3 4.4 4.5 4.2  CL 101 102 103 103 99  CO2 24 22 23 25 24   GLUCOSE 148* 183* 197* 202* 197*  BUN 22 30* 26* 28* 27*  CREATININE 1.00 0.82  0.75 0.76 0.87  CALCIUM 8.3* 8.0* 8.2* 8.5* 7.9*  MG  --  2.0 2.5* 2.5*  --   PHOS  --  3.2 3.1 3.0  --    GFR: Estimated Creatinine Clearance: 79.3 mL/min (by C-G formula based on SCr of 0.87 mg/dL). Liver Function Tests: Recent Labs  Lab 05/26/19 2321 05/28/19 0239 05/29/19 0722 05/30/19 0237 05/31/19 0219  AST 59*  --   --   --  67*  ALT 47*  --   --   --  97*  ALKPHOS 119  --   --   --  130*  BILITOT 4.4*  --   --   --  1.1  PROT 6.8  --   --   --  6.1*  ALBUMIN 3.3* 3.1* 2.9* 3.1* 2.8*   Recent Labs  Lab 05/26/19 2321  LIPASE 31   No results for input(s): AMMONIA in the last 168 hours. Coagulation Profile: Recent Labs  Lab 05/26/19 2321  INR 1.2   Cardiac Enzymes: No results for input(s): CKTOTAL, CKMB, CKMBINDEX, TROPONINI in the last 168 hours. BNP (last 3 results) No results for input(s): PROBNP in the last 8760 hours. HbA1C: No results for input(s): HGBA1C in the last 72 hours. CBG: No results for input(s): GLUCAP in the last 168 hours. Lipid Profile: No results for  input(s): CHOL, HDL, LDLCALC, TRIG, CHOLHDL, LDLDIRECT in the last 72 hours. Thyroid Function Tests: No results for input(s): TSH, T4TOTAL, FREET4, T3FREE, THYROIDAB in the last 72 hours. Anemia Panel: No results for input(s): VITAMINB12, FOLATE, FERRITIN, TIBC, IRON, RETICCTPCT in the last 72 hours. Sepsis Labs: Recent Labs  Lab 05/26/19 2321  05/27/19 1216 05/27/19 1446 05/28/19 1009 05/28/19 1249  PROCALCITON 4.29  --   --   --   --   --   LATICACIDVEN 2.8*   < > 3.2* 5.4* 3.2* 2.8*   < > = values in this interval not displayed.    Recent Results (from the past 240 hour(s))  SARS Coronavirus 2 (CEPHEID- Performed in [place] hospital lab), Hosp Order     Status: None   Collection Time: 05/26/19 11:28 PM   Specimen: Nasopharyngeal Swab  Result Value Ref Range Status   SARS Coronavirus 2 NEGATIVE NEGATIVE Final    Comment: (NOTE) If result is NEGATIVE SARS-CoV-2 target  nucleic acids are NOT DETECTED. The SARS-CoV-2 RNA is generally detectable in upper and lower  respiratory specimens during the acute phase of infection. The lowest  concentration of SARS-CoV-2 viral copies this assay can detect is 250  copies / mL. A negative result does not preclude SARS-CoV-2 infection  and should not be used as the sole basis for treatment or other  patient management decisions.  A negative result may occur with  improper specimen collection / handling, submission of specimen other  than nasopharyngeal swab, presence of viral mutation(s) within the  areas targeted by this assay, and inadequate number of viral copies  (<250 copies / mL). A negative result must be combined with clinical  observations, patient history, and epidemiological information. If result is POSITIVE SARS-CoV-2 target nucleic acids are DETECTED. The SARS-CoV-2 RNA is generally detectable in upper and lower  respiratory specimens dur ing the acute phase of infection.  Positive  results are indicative of active infection with SARS-CoV-2.  Clinical  correlation with patient history and other diagnostic information is  necessary to determine patient infection status.  Positive results do  not rule out bacterial infection or co-infection with other viruses. If result is PRESUMPTIVE POSTIVE SARS-CoV-2 nucleic acids MAY BE PRESENT.   A presumptive positive result was obtained on the submitted specimen  and confirmed on repeat testing.  While 2019 novel coronavirus  (SARS-CoV-2) nucleic acids may be present in the submitted sample  additional confirmatory testing may be necessary for epidemiological  and / or clinical management purposes  to differentiate between  SARS-CoV-2 and other Sarbecovirus currently known to infect humans.  If clinically indicated additional testing with an alternate test  methodology [phone]) is advised. The SARS-CoV-2 RNA is generally  detectable in upper and lower  respiratory sp ecimens during the acute  phase of infection. The expected result is Negative. Fact Sheet for Patients:  StrictlyIdeas.no Fact Sheet for Healthcare Providers: BankingDealers.co.za This test is not yet approved or cleared by the [place] FDA and has been authorized for detection and/or diagnosis of SARS-CoV-2 by FDA under an Emergency Use Authorization (EUA).  This EUA will remain in effect (meaning this test can be used) for the duration of the COVID-19 declaration under Section 564(b)(1) of the Act, 21 U.S.C. section 360bbb-3(b)(1), unless the authorization is terminated or revoked sooner. Performed at [facility], [place] [address] [place], [place] [address]   Blood Culture (routine x 2)     Status: Abnormal   Collection Time:  05/26/19 11:29 PM   Specimen: BLOOD  Result Value Ref Range Status   Specimen Description BLOOD RIGHT ANTECUBITAL  Final   Special Requests   Final    BOTTLES DRAWN AEROBIC AND ANAEROBIC Blood Culture adequate volume   Culture  Setup Time   Final    GRAM NEGATIVE RODS IN BOTH AEROBIC AND ANAEROBIC BOTTLES CRITICAL VALUE NOTED.  VALUE IS CONSISTENT WITH PREVIOUSLY REPORTED AND CALLED VALUE. GRAM POSITIVE RODS ANAEROBIC BOTTLE ONLY Performed at [facility], [place] [address] [place], [place] [address]    Culture ESCHERICHIA COLI CLOSTRIDIUM PERFRINGENS  (A)  Final   Report Status 05/29/2019 FINAL  Final   Organism ID, Bacteria ESCHERICHIA COLI  Final      Susceptibility   Escherichia coli - MIC*    AMPICILLIN <=2 SENSITIVE Sensitive     CEFAZOLIN <=4 SENSITIVE Sensitive     CEFEPIME <=1 SENSITIVE Sensitive     CEFTAZIDIME <=1 SENSITIVE Sensitive     CEFTRIAXONE <=1 SENSITIVE Sensitive     CIPROFLOXACIN <=0.25 SENSITIVE Sensitive     GENTAMICIN <=1 SENSITIVE Sensitive     IMIPENEM <=0.25 SENSITIVE Sensitive     TRIMETH/SULFA <=20 SENSITIVE Sensitive      AMPICILLIN/SULBACTAM <=2 SENSITIVE Sensitive     PIP/TAZO <=4 SENSITIVE Sensitive     Extended ESBL NEGATIVE Sensitive     * ESCHERICHIA COLI  Blood Culture (routine x 2)     Status: Abnormal   Collection Time: 05/26/19 11:34 PM   Specimen: BLOOD  Result Value Ref Range Status   Specimen Description BLOOD LEFT ANTECUBITAL  Final   Special Requests   Final    BOTTLES DRAWN AEROBIC AND ANAEROBIC Blood Culture adequate volume   Culture  Setup Time   Final    GRAM NEGATIVE RODS IN BOTH AEROBIC AND ANAEROBIC BOTTLES CRITICAL RESULT CALLED TO, READ BACK BY AND VERIFIED WITH: PHARMD A. PHAM [id number] FCP GRAM POSITIVE RODS ANAEROBIC BOTTLE ONLY Performed at [facility], [place] [address], [place], [place] [address]    Culture ESCHERICHIA COLI CLOSTRIDIUM PERFRINGENS  (A)  Final   Report Status 05/29/2019 FINAL  Final  Blood Culture ID Panel (Reflexed)     Status: Abnormal   Collection Time: 05/26/19 11:34 PM  Result Value Ref Range Status   Enterococcus species NOT DETECTED NOT DETECTED Final   Listeria monocytogenes NOT DETECTED NOT DETECTED Final   Staphylococcus species NOT DETECTED NOT DETECTED Final   Staphylococcus aureus (BCID) NOT DETECTED NOT DETECTED Final   Streptococcus species NOT DETECTED NOT DETECTED Final   Streptococcus agalactiae NOT DETECTED NOT DETECTED Final   Streptococcus pneumoniae NOT DETECTED NOT DETECTED Final   Streptococcus pyogenes NOT DETECTED NOT DETECTED Final   Acinetobacter baumannii NOT DETECTED NOT DETECTED Final   Enterobacteriaceae species DETECTED (A) NOT DETECTED Final    Comment: Enterobacteriaceae represent a large family of gram-negative bacteria, not a single organism. CRITICAL RESULT CALLED TO, READ BACK BY AND VERIFIED WITH: PHARMD A. PHAM [id number] FCP    Enterobacter cloacae complex NOT DETECTED NOT DETECTED Final   Escherichia coli DETECTED (A) NOT DETECTED Final    Comment: CRITICAL RESULT CALLED TO, READ BACK BY AND  VERIFIED WITH: PHARMD A. PHAM [phone] [id number] FCP    Klebsiella oxytoca NOT DETECTED NOT DETECTED Final   Klebsiella pneumoniae NOT DETECTED NOT DETECTED Final   Proteus species NOT DETECTED NOT DETECTED Final   Serratia marcescens NOT DETECTED NOT DETECTED Final   Carbapenem  resistance NOT DETECTED NOT DETECTED Final   Haemophilus influenzae NOT DETECTED NOT DETECTED Final   Neisseria meningitidis NOT DETECTED NOT DETECTED Final   Pseudomonas aeruginosa NOT DETECTED NOT DETECTED Final   Candida albicans NOT DETECTED NOT DETECTED Final   Candida glabrata NOT DETECTED NOT DETECTED Final   Candida krusei NOT DETECTED NOT DETECTED Final   Candida parapsilosis NOT DETECTED NOT DETECTED Final   Candida tropicalis NOT DETECTED NOT DETECTED Final    Comment: Performed at [facility], [place] [address], [place], [place] [address]  Urine culture     Status: None   Collection Time: 05/27/19  1:54 AM   Specimen: In/Out Cath Urine  Result Value Ref Range Status   Specimen Description   Final    IN/OUT CATH URINE Performed at [place] [address], [place], [place] [address]    Special Requests   Final    NONE Performed at [facility], [place] [address], [place], [place] [address]    Culture   Final    NO GROWTH Performed at [facility], [place] [address], [place], [place] [address]    Report Status 05/28/2019 FINAL  Final  MRSA PCR Screening     Status: None   Collection Time: 05/27/19  5:29 AM   Specimen: Nasal Mucosa; Nasopharyngeal  Result Value Ref Range Status   MRSA by PCR NEGATIVE NEGATIVE Final    Comment:        The GeneXpert MRSA Assay (FDA approved for NASAL specimens only), is one component of a comprehensive MRSA colonization surveillance program. It is not intended to diagnose MRSA infection nor to guide or monitor treatment for MRSA infections. Performed at [facility], [place] [address],  [place], [place] [address]   Respiratory Panel by PCR     Status: None   Collection Time: 05/28/19 12:30 AM   Specimen: Nasopharyngeal Swab; Respiratory  Result Value Ref Range Status   Adenovirus NOT DETECTED NOT DETECTED Final   Coronavirus 229E NOT DETECTED NOT DETECTED Final    Comment: (NOTE) The Coronavirus on the Respiratory Panel, DOES NOT test for the novel  Coronavirus (2019 nCoV)    Coronavirus HKU1 NOT DETECTED NOT DETECTED Final   Coronavirus NL63 NOT DETECTED NOT DETECTED Final   Coronavirus OC43 NOT DETECTED NOT DETECTED Final   Metapneumovirus NOT DETECTED NOT DETECTED Final   Rhinovirus / Enterovirus NOT DETECTED NOT DETECTED Final   Influenza A NOT DETECTED NOT DETECTED Final   Influenza B NOT DETECTED NOT DETECTED Final   Parainfluenza Virus 1 NOT DETECTED NOT DETECTED Final   Parainfluenza Virus 2 NOT DETECTED NOT DETECTED Final   Parainfluenza Virus 3 NOT DETECTED NOT DETECTED Final   Parainfluenza Virus 4 NOT DETECTED NOT DETECTED Final   Respiratory Syncytial Virus NOT DETECTED NOT DETECTED Final   Bordetella pertussis NOT DETECTED NOT DETECTED Final   Chlamydophila pneumoniae NOT DETECTED NOT DETECTED Final   Mycoplasma pneumoniae NOT DETECTED NOT DETECTED Final    Comment: Performed at [facility], [place]. [address], [place], [place] [address]  Culture, blood (routine x 2)     Status: None (Preliminary result)   Collection Time: 05/29/19  4:07 PM   Specimen: BLOOD  Result Value Ref Range Status   Specimen Description   Final    BLOOD LEFT ANTECUBITAL Performed at [place] [address], [place], [place] [address]    Special Requests  Final    BOTTLES DRAWN AEROBIC ONLY Blood Culture adequate volume Performed at [place] [address], [place], [place] [address]    Culture   Final    NO GROWTH 2 DAYS Performed at [place] [address] [place], [place] [address]    Report Status PENDING   Incomplete  Culture, blood (routine x 2)     Status: None (Preliminary result)   Collection Time: 05/29/19  4:16 PM   Specimen: BLOOD RIGHT HAND  Result Value Ref Range Status   Specimen Description   Final    BLOOD RIGHT HAND Performed at [place] [address] [place], [place] [address]    Special Requests   Final    BOTTLES DRAWN AEROBIC ONLY Blood Culture results may not be optimal due to an inadequate volume of blood received in culture bottles Performed at [place] [address] [place], [place] [address]    Culture   Final    NO GROWTH 2 DAYS Performed at [place] [address], [place], [place] [address]    Report Status PENDING  Incomplete     Radiology Studies: No results found.  Scheduled Meds:  ALPRAZolam  0.5 mg Oral QHS   budesonide  0.5 mg Nebulization BID   chlorhexidine  15 mL Mouth Rinse BID   Chlorhexidine Gluconate Cloth  6 each Topical Daily   cholecalciferol  400 Units Oral QHS   furosemide  40 mg Intravenous BID   ipratropium-albuterol  3 mL Nebulization Q4H   mouth rinse  15 mL Mouth Rinse q12n4p   methocarbamol  1,000 mg Oral QHS   methylPREDNISolone (SOLU-MEDROL) injection  60 mg Intravenous Q12H   metoprolol tartrate  25 mg Oral BID   multivitamin  1 tablet Oral BID   pantoprazole  40 mg Oral Daily   simvastatin  20 mg Oral q1800   vitamin B-12  1,000 mcg Oral Daily   vitamin C  1,000 mg Oral Daily   Continuous Infusions:  sodium chloride Stopped (05/28/19 0209)   ampicillin (OMNIPEN) IV 2 g (05/31/19 1540)     LOS: 4 days   Marylu Lund, MD [facility] Pager On Amion  If 7PM-7AM, please contact night-coverage 05/31/2019, 4:28 PM

## 2019-06-01 LAB — CBC
HCT: 30.2 % — ABNORMAL LOW (ref 39.0–52.0)
Hemoglobin: 9.6 g/dL — ABNORMAL LOW (ref 13.0–17.0)
MCH: 31.1 pg (ref 26.0–34.0)
MCHC: 31.8 g/dL (ref 30.0–36.0)
MCV: 97.7 fL (ref 80.0–100.0)
Platelets: 142 10*3/uL — ABNORMAL LOW (ref 150–400)
RBC: 3.09 MIL/uL — ABNORMAL LOW (ref 4.22–5.81)
RDW: 17.8 % — ABNORMAL HIGH (ref 11.5–15.5)
WBC: 11.9 10*3/uL — ABNORMAL HIGH (ref 4.0–10.5)
nRBC: 0.4 % — ABNORMAL HIGH (ref 0.0–0.2)

## 2019-06-01 LAB — COMPREHENSIVE METABOLIC PANEL
ALT: 118 U/L — ABNORMAL HIGH (ref 0–44)
AST: 68 U/L — ABNORMAL HIGH (ref 15–41)
Albumin: 2.8 g/dL — ABNORMAL LOW (ref 3.5–5.0)
Alkaline Phosphatase: 121 U/L (ref 38–126)
Anion gap: 13 (ref 5–15)
BUN: 28 mg/dL — ABNORMAL HIGH (ref 8–23)
CO2: 26 mmol/L (ref 22–32)
Calcium: 7.9 mg/dL — ABNORMAL LOW (ref 8.9–10.3)
Chloride: 93 mmol/L — ABNORMAL LOW (ref 98–111)
Creatinine, Ser: 0.89 mg/dL (ref 0.61–1.24)
GFR calc Af Amer: >60 mL/min (ref >60)
GFR calc non Af Amer: >60 mL/min (ref >60)
Glucose, Bld: 220 mg/dL — ABNORMAL HIGH (ref 70–99)
Potassium: 4.3 mmol/L (ref 3.5–5.1)
Sodium: 132 mmol/L — ABNORMAL LOW (ref 135–145)
Total Bilirubin: 1.1 mg/dL (ref 0.3–1.2)
Total Protein: 5.9 g/dL — ABNORMAL LOW (ref 6.5–8.1)

## 2019-06-01 NOTE — Progress Notes (Signed)
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  Patient states that he started having worsening shortness of breath, fever and chills since this afternoon. He had temperature 102 at home. He has dry cough, denies chest pain, runny nose or sore throat. Per report, he had oxygen desaturation to 70s% on nonrebreather, which improved to 98% on BiPAP. Patient denies nausea,vomiting, diarrhea, abdominal pain, symptoms of UTI. He denies dark stool or rectal bleeding currently. Of note, patient has history of GI bleeding, andgetting frequent blood transfusion. He refused GI evaluation recently.  Assessment & Plan:   Principal Problem:   Acute on chronic respiratory failure with hypoxia and hypercapnia (HCC) Active Problems:   Malignant neoplasm of bladder (HCC)   HYPERCHOLESTEROLEMIA   Anxiety   Essential hypertension   Macrocytic anemia   COPD with acute exacerbation (HCC)   Chronic diastolic CHF (congestive heart failure) (HCC)   DNR (do not resuscitate)   Elevated troponin   OSA (obstructive sleep apnea)   Sepsis (HCC)   COPD exacerbation (HCC)   Shortness of breath   Acute on chronic respiratory failure with hypoxia and hypercapnia due to COPD exacerbation and acute CHF:     - COVID testing negative     - elevated d-dimer; CTA negative for PE     - Most recent ABG: ph 7.391, pCO2 41, pO2 157     - Was on vanc/rocephin, currently on ampicillin     - Inhaler: Atrovent inhaler, prn  Xopenex inhaler     - Continue with Solu-Medrol 60 mg IV tid     - Mucinex for cough      - Incentive  spirometry     - Urine S. pneumococcal antigen, resp viral panel, sputum Cx all pending     - f/u LE to r/o DVT     - Pt reports being on 6LNC at home, states historically weans to [facility] in hospital     - Minimal wheezing at present.     - Still sob and requiring bipap. Increased lasix per below    - Continue on lasix per below  Sepsis from e coli bacteremia/GPR bacteremia, present on admit     - source unclear, but likely necrotic tumor     - leukocytosis, fever, tachycardia, tachypnea     - BP responsive to fluids     - CXR w/ metastatic disease/COPD     - Bld Cx +GPR/e. coli x 2     - procal: 4     - Hx of HF, giving fluids, but must monitor     - Currently presently on ampicillin     - seem stable at this time  Malignant neoplasm of bladder     - CTA showed an enlarged tumor size, hepatic metastases, progressive pulmonary metastasis.       - Patient is s/p radiation therapy, no surgery and chemotherapy per patient.     - Palliative consult: "DNR DNI. Home with continuation of home based palliative care. Patient doesn't want hospice, doesn't want to talk about hospice services.  Continue current mode of care."      - Currently stable  HLD     -  Will continue on zocor as toleated  Anxiety:     - xanax  Essential hypertension:      - BP remains stable     - Continue current regimen as tolerated  Macrocytic anemia:      - Denies rectal bleeding or dark stool.      - Pt had symptomatic anemia which required 2 units pRBC transfusion. Pt has possible GI loss, but he denies declined evaluation by surgery or GI     - his plan is for intermittent transfusions going forward as needed     - hgb of 6.7 on 7/13, improved to over 8 with one unit PRBC     - Remains hemodynamically stable at present  Acute on Chronic diastolic CHF:      - BNP 932, CXR did not show pulmonary edema.     - Lasix remains on hold at this time.      - Some improvement with PO lasix thus far    - BLE  remains edematous this AM    - Will continue with 40mg  IV lasix BID. Good urine output  OSA:     - continued with BiPAP as tolerated  Elevated troponin:      - Troponins are minimally elevated, 17/20.       - Denies any chest pain most likely due to demand ischemia secondary to sepsis and hypoxia.     - No aspirin due to possible GI bleeding     - Continue Zocor as tolerated     -Will continue on NTG as tolerated  DVT prophylaxis: SCD's Code Status: DNR Family Communication: Pt in room, family not at bedside Disposition Plan: Uncertain at this time  Consultants:   Palliative Care  Procedures:     Antimicrobials: Anti-infectives (From admission, onward)   Start     Dose/Rate Route Frequency Ordered Stop   05/29/19 1430  ampicillin (OMNIPEN) 2 g in sodium chloride 0.9 % 100 mL IVPB     2 g 300 mL/hr over 20 Minutes Intravenous Every 4 hours 05/29/19 1140     05/28/19 0300  vancomycin (VANCOCIN) 1,250 mg in sodium chloride 0.9 % 250 mL IVPB  Status:  Discontinued     1,250 mg 166.7 mL/hr over 90 Minutes Intravenous Daily 05/27/19 0236 05/29/19 0711   05/27/19 1600  cefTRIAXone (ROCEPHIN) 2 g in sodium chloride 0.9 % 100 mL IVPB  Status:  Discontinued     2 g 200 mL/hr over 30 Minutes Intravenous Daily-1800 05/27/19 1542 05/29/19 1140   05/27/19 0800  ceFEPIme (MAXIPIME) 2 g in sodium chloride 0.9 % 100 mL IVPB  Status:  Discontinued     2 g 200 mL/hr over 30 Minutes Intravenous Every 8 hours 05/27/19 0236 05/27/19 1542   05/26/19 2345  vancomycin (VANCOCIN) 2,000 mg in sodium chloride 0.9 % 500 mL IVPB     2,000 mg 250 mL/hr over 120 Minutes Intravenous  Once 05/26/19 2339 05/27/19 0201   05/26/19 2330  ceFEPIme (MAXIPIME) 2 g in sodium chloride 0.9 % 100 mL IVPB     2 g 200 mL/hr over 30 Minutes Intravenous  Once 05/26/19 2326 05/27/19 0046   05/26/19 2330  metroNIDAZOLE (FLAGYL) IVPB 500 mg     500 mg 100 mL/hr over 60 Minutes Intravenous  Once 05/26/19 2326  05/27/19 0151   05/26/19 2330  vancomycin (VANCOCIN) IVPB 1000 mg/200 mL premix  Status:  Discontinued     1,000 mg 200 mL/hr over 60  Minutes Intravenous  Once 05/26/19 2326 05/26/19 2339      Subjective: States feeling somewhat better, still swollen  Objective: Vitals:   06/01/19 1300 06/01/19 1400 06/01/19 1430 06/01/19 1514  BP:   (!) 152/72   Pulse: 78 72 75   Resp: 17 17 (!) 21   Temp:      TempSrc:      SpO2: 99% 99% 100% 98%  Weight:      Height:        Intake/Output Summary (Last 24 hours) at 06/01/2019 1527 Last data filed at 06/01/2019 1430 Gross per 24 hour  Intake 903.73 ml  Output 3000 ml  Net -2096.27 ml   Filed Weights   05/28/19 0627 05/29/19 0610 06/01/19 0348  Weight: 92.7 kg 92.5 kg 91.6 kg    Examination: General exam: Conversant, in no acute distress Respiratory system: normal chest rise, clear, no audible wheezing, decreased BS Cardiovascular system: regular rhythm, s1-s2 Gastrointestinal system: Nondistended, nontender, pos BS Central nervous system: No seizures, no tremors Extremities: No cyanosis, no joint deformities, BLE edema Skin: No rashes, no pallor Psychiatry: Affect normal // no auditory hallucinations   Data Reviewed: I have personally reviewed following labs and imaging studies  CBC: Recent Labs  Lab 05/26/19 2321 05/28/19 0239 05/28/19 1728 05/29/19 0722 05/30/19 0237 05/31/19 0219 06/01/19 0222  WBC 10.7* 13.0*  --  13.3* 13.6* 13.4* 11.9*  NEUTROABS 9.7* 11.6*  --  12.2* 12.9*  --   --   HGB 7.6* 6.7* 8.7* 8.3* 8.7* 9.1* 9.6*  HCT 25.3* 21.8* 27.7* 25.9* 28.1* 29.8* 30.2*  MCV 100.0 98.6  --  97.7 99.3 99.0 97.7  PLT 199 137*  --  124* 135* 143* 762*   Basic Metabolic Panel: Recent Labs  Lab 05/28/19 0239 05/29/19 0722 05/30/19 0237 05/31/19 0219 06/01/19 0222  NA 135 135 136 133* 132*  K 4.3 4.4 4.5 4.2 4.3  CL 102 103 103 99 93*  CO2 22 23 25 24 26   GLUCOSE 183* 197* 202* 197* 220*  BUN 30* 26* 28* 27*  28*  CREATININE 0.82 0.75 0.76 0.87 0.89  CALCIUM 8.0* 8.2* 8.5* 7.9* 7.9*  MG 2.0 2.5* 2.5*  --   --   PHOS 3.2 3.1 3.0  --   --    GFR: Estimated Creatinine Clearance: 77.1 mL/min (by C-G formula based on SCr of 0.89 mg/dL). Liver Function Tests: Recent Labs  Lab 05/26/19 2321 05/28/19 0239 05/29/19 0722 05/30/19 0237 05/31/19 0219 06/01/19 0222  AST 59*  --   --   --  67* 68*  ALT 47*  --   --   --  97* 118*  ALKPHOS 119  --   --   --  130* 121  BILITOT 4.4*  --   --   --  1.1 1.1  PROT 6.8  --   --   --  6.1* 5.9*  ALBUMIN 3.3* 3.1* 2.9* 3.1* 2.8* 2.8*   Recent Labs  Lab 05/26/19 2321  LIPASE 31   No results for input(s): AMMONIA in the last 168 hours. Coagulation Profile: Recent Labs  Lab 05/26/19 2321  INR 1.2   Cardiac Enzymes: No results for input(s): CKTOTAL, CKMB, CKMBINDEX, TROPONINI in the last 168 hours. BNP (last 3 results) No results for input(s): PROBNP in the last 8760 hours. HbA1C: No results for input(s): HGBA1C in the last 72 hours. CBG: No results for input(s): GLUCAP in the last 168 hours. Lipid Profile: No results for input(s):  CHOL, HDL, LDLCALC, TRIG, CHOLHDL, LDLDIRECT in the last 72 hours. Thyroid Function Tests: No results for input(s): TSH, T4TOTAL, FREET4, T3FREE, THYROIDAB in the last 72 hours. Anemia Panel: No results for input(s): VITAMINB12, FOLATE, FERRITIN, TIBC, IRON, RETICCTPCT in the last 72 hours. Sepsis Labs: Recent Labs  Lab 05/26/19 2321  05/27/19 1216 05/27/19 1446 05/28/19 1009 05/28/19 1249  PROCALCITON 4.29  --   --   --   --   --   LATICACIDVEN 2.8*   < > 3.2* 5.4* 3.2* 2.8*   < > = values in this interval not displayed.    Recent Results (from the past 240 hour(s))  SARS Coronavirus 2 (CEPHEID- Performed in [place] hospital lab), Hosp Order     Status: None   Collection Time: 05/26/19 11:28 PM   Specimen: Nasopharyngeal Swab  Result Value Ref Range Status   SARS Coronavirus 2 NEGATIVE NEGATIVE  Final    Comment: (NOTE) If result is NEGATIVE SARS-CoV-2 target nucleic acids are NOT DETECTED. The SARS-CoV-2 RNA is generally detectable in upper and lower  respiratory specimens during the acute phase of infection. The lowest  concentration of SARS-CoV-2 viral copies this assay can detect is 250  copies / mL. A negative result does not preclude SARS-CoV-2 infection  and should not be used as the sole basis for treatment or other  patient management decisions.  A negative result may occur with  improper specimen collection / handling, submission of specimen other  than nasopharyngeal swab, presence of viral mutation(s) within the  areas targeted by this assay, and inadequate number of viral copies  (<250 copies / mL). A negative result must be combined with clinical  observations, patient history, and epidemiological information. If result is POSITIVE SARS-CoV-2 target nucleic acids are DETECTED. The SARS-CoV-2 RNA is generally detectable in upper and lower  respiratory specimens dur ing the acute phase of infection.  Positive  results are indicative of active infection with SARS-CoV-2.  Clinical  correlation with patient history and other diagnostic information is  necessary to determine patient infection status.  Positive results do  not rule out bacterial infection or co-infection with other viruses. If result is PRESUMPTIVE POSTIVE SARS-CoV-2 nucleic acids MAY BE PRESENT.   A presumptive positive result was obtained on the submitted specimen  and confirmed on repeat testing.  While 2019 novel coronavirus  (SARS-CoV-2) nucleic acids may be present in the submitted sample  additional confirmatory testing may be necessary for epidemiological  and / or clinical management purposes  to differentiate between  SARS-CoV-2 and other Sarbecovirus currently known to infect humans.  If clinically indicated additional testing with an alternate test  methodology [phone]) is advised. The  SARS-CoV-2 RNA is generally  detectable in upper and lower respiratory sp ecimens during the acute  phase of infection. The expected result is Negative. Fact Sheet for Patients:  StrictlyIdeas.no Fact Sheet for Healthcare Providers: BankingDealers.co.za This test is not yet approved or cleared by the [place] FDA and has been authorized for detection and/or diagnosis of SARS-CoV-2 by FDA under an Emergency Use Authorization (EUA).  This EUA will remain in effect (meaning this test can be used) for the duration of the COVID-19 declaration under Section 564(b)(1) of the Act, 21 U.S.C. section 360bbb-3(b)(1), unless the authorization is terminated or revoked sooner. Performed at [facility], [place] [address], [place], [place] [address]   Blood Culture (routine x 2)     Status: Abnormal   Collection Time: 05/26/19  11:29 PM   Specimen: BLOOD  Result Value Ref Range Status   Specimen Description BLOOD RIGHT ANTECUBITAL  Final   Special Requests   Final    BOTTLES DRAWN AEROBIC AND ANAEROBIC Blood Culture adequate volume   Culture  Setup Time   Final    GRAM NEGATIVE RODS IN BOTH AEROBIC AND ANAEROBIC BOTTLES CRITICAL VALUE NOTED.  VALUE IS CONSISTENT WITH PREVIOUSLY REPORTED AND CALLED VALUE. GRAM POSITIVE RODS ANAEROBIC BOTTLE ONLY Performed at [facility], [place] [address] [place], [place] [address]    Culture ESCHERICHIA COLI CLOSTRIDIUM PERFRINGENS  (A)  Final   Report Status 05/29/2019 FINAL  Final   Organism ID, Bacteria ESCHERICHIA COLI  Final      Susceptibility   Escherichia coli - MIC*    AMPICILLIN <=2 SENSITIVE Sensitive     CEFAZOLIN <=4 SENSITIVE Sensitive     CEFEPIME <=1 SENSITIVE Sensitive     CEFTAZIDIME <=1 SENSITIVE Sensitive     CEFTRIAXONE <=1 SENSITIVE Sensitive     CIPROFLOXACIN <=0.25 SENSITIVE Sensitive     GENTAMICIN <=1 SENSITIVE Sensitive     IMIPENEM <=0.25 SENSITIVE  Sensitive     TRIMETH/SULFA <=20 SENSITIVE Sensitive     AMPICILLIN/SULBACTAM <=2 SENSITIVE Sensitive     PIP/TAZO <=4 SENSITIVE Sensitive     Extended ESBL NEGATIVE Sensitive     * ESCHERICHIA COLI  Blood Culture (routine x 2)     Status: Abnormal   Collection Time: 05/26/19 11:34 PM   Specimen: BLOOD  Result Value Ref Range Status   Specimen Description BLOOD LEFT ANTECUBITAL  Final   Special Requests   Final    BOTTLES DRAWN AEROBIC AND ANAEROBIC Blood Culture adequate volume   Culture  Setup Time   Final    GRAM NEGATIVE RODS IN BOTH AEROBIC AND ANAEROBIC BOTTLES CRITICAL RESULT CALLED TO, READ BACK BY AND VERIFIED WITH: PHARMD A. PHAM 1420 [id number] FCP GRAM POSITIVE RODS ANAEROBIC BOTTLE ONLY Performed at [facility], [place] [address], [place],  [address]    Culture ESCHERICHIA COLI CLOSTRIDIUM PERFRINGENS  (A)  Final   Report Status 05/29/2019 FINAL  Final  Blood Culture ID Panel (Reflexed)     Status: Abnormal   Collection Time: 05/26/19 11:34 PM  Result Value Ref Range Status   Enterococcus species NOT DETECTED NOT DETECTED Final   Listeria monocytogenes NOT DETECTED NOT DETECTED Final   Staphylococcus species NOT DETECTED NOT DETECTED Final   Staphylococcus aureus (BCID) NOT DETECTED NOT DETECTED Final   Streptococcus species NOT DETECTED NOT DETECTED Final   Streptococcus agalactiae NOT DETECTED NOT DETECTED Final   Streptococcus pneumoniae NOT DETECTED NOT DETECTED Final   Streptococcus pyogenes NOT DETECTED NOT DETECTED Final   Acinetobacter baumannii NOT DETECTED NOT DETECTED Final   Enterobacteriaceae species DETECTED (A) NOT DETECTED Final    Comment: Enterobacteriaceae represent a large family of gram-negative bacteria, not a single organism. CRITICAL RESULT CALLED TO, READ BACK BY AND VERIFIED WITH: PHARMD A. PHAM 1420 [id number] FCP    Enterobacter cloacae complex NOT DETECTED NOT DETECTED Final   Escherichia coli DETECTED (A) NOT DETECTED Final     Comment: CRITICAL RESULT CALLED TO, READ BACK BY AND VERIFIED WITH: PHARMD A. PHAM 1420 [id number] FCP    Klebsiella oxytoca NOT DETECTED NOT DETECTED Final   Klebsiella pneumoniae NOT DETECTED NOT DETECTED Final   Proteus species NOT DETECTED NOT DETECTED Final   Serratia marcescens NOT DETECTED NOT DETECTED Final   Carbapenem resistance  NOT DETECTED NOT DETECTED Final   Haemophilus influenzae NOT DETECTED NOT DETECTED Final   Neisseria meningitidis NOT DETECTED NOT DETECTED Final   Pseudomonas aeruginosa NOT DETECTED NOT DETECTED Final   Candida albicans NOT DETECTED NOT DETECTED Final   Candida glabrata NOT DETECTED NOT DETECTED Final   Candida krusei NOT DETECTED NOT DETECTED Final   Candida parapsilosis NOT DETECTED NOT DETECTED Final   Candida tropicalis NOT DETECTED NOT DETECTED Final    Comment: Performed at [facility], [place] [address] [place], [place] [address]  Urine culture     Status: None   Collection Time: 05/27/19  1:54 AM   Specimen: In/Out Cath Urine  Result Value Ref Range Status   Specimen Description   Final    IN/OUT CATH URINE Performed at [place] [address], [place], [place] [address]    Special Requests   Final    NONE Performed at [facility], [place] [address] [place], [place] [address]    Culture   Final    NO GROWTH Performed at [facility], [place] [address] [place], [place] [address]    Report Status 05/28/2019 FINAL  Final  MRSA PCR Screening     Status: None   Collection Time: 05/27/19  5:29 AM   Specimen: Nasal Mucosa; Nasopharyngeal  Result Value Ref Range Status   MRSA by PCR NEGATIVE NEGATIVE Final    Comment:        The GeneXpert MRSA Assay (FDA approved for NASAL specimens only), is one component of a comprehensive MRSA colonization surveillance program. It is not intended to diagnose MRSA infection nor to guide or monitor treatment for MRSA infections. Performed at [facility], [place] [address] [place], [place] [address]   Respiratory Panel by PCR     Status: None   Collection Time: 05/28/19 12:30 AM   Specimen: Nasopharyngeal Swab; Respiratory  Result Value Ref Range Status   Adenovirus NOT DETECTED NOT DETECTED Final   Coronavirus 229E NOT DETECTED NOT DETECTED Final    Comment: (NOTE) The Coronavirus on the Respiratory Panel, DOES NOT test for the novel  Coronavirus (2019 nCoV)    Coronavirus HKU1 NOT DETECTED NOT DETECTED Final   Coronavirus NL63 NOT DETECTED NOT DETECTED Final   Coronavirus OC43 NOT DETECTED NOT DETECTED Final   Metapneumovirus NOT DETECTED NOT DETECTED Final   Rhinovirus / Enterovirus NOT DETECTED NOT DETECTED Final   Influenza A NOT DETECTED NOT DETECTED Final   Influenza B NOT DETECTED NOT DETECTED Final   Parainfluenza Virus 1 NOT DETECTED NOT DETECTED Final   Parainfluenza Virus 2 NOT DETECTED NOT DETECTED Final   Parainfluenza Virus 3 NOT DETECTED NOT DETECTED Final   Parainfluenza Virus 4 NOT DETECTED NOT DETECTED Final   Respiratory Syncytial Virus NOT DETECTED NOT DETECTED Final   Bordetella pertussis NOT DETECTED NOT DETECTED Final   Chlamydophila pneumoniae NOT DETECTED NOT DETECTED Final   Mycoplasma pneumoniae NOT DETECTED NOT DETECTED Final    Comment: Performed at [facility], [place]. [address], [place], [place] [address]  Culture, blood (routine x 2)     Status: None (Preliminary result)   Collection Time: 05/29/19  4:07 PM   Specimen: BLOOD  Result Value Ref Range Status   Specimen Description   Final    BLOOD LEFT ANTECUBITAL Performed at [place] [address] [place], [place] [address]    Special Requests  Final    BOTTLES DRAWN AEROBIC ONLY Blood Culture adequate volume Performed at [place] [address], [place], [place] [address]    Culture   Final    NO GROWTH 3 DAYS Performed at [facility], [place] [address],  [place], [place] [address]    Report Status PENDING  Incomplete  Culture, blood (routine x 2)     Status: None (Preliminary result)   Collection Time: 05/29/19  4:16 PM   Specimen: BLOOD RIGHT HAND  Result Value Ref Range Status   Specimen Description   Final    BLOOD RIGHT HAND Performed at [place] [address] [place], [place] [address]    Special Requests   Final    BOTTLES DRAWN AEROBIC ONLY Blood Culture results may not be optimal due to an inadequate volume of blood received in culture bottles Performed at [place] [address] [place], [place] [address]    Culture   Final    NO GROWTH 3 DAYS Performed at [facility], [place] [address] [place], [place] [address]    Report Status PENDING  Incomplete     Radiology Studies: No results found.  Scheduled Meds:  ALPRAZolam  0.5 mg Oral QHS   budesonide  0.5 mg Nebulization BID   chlorhexidine  15 mL Mouth Rinse BID   Chlorhexidine Gluconate Cloth  6 each Topical Daily   cholecalciferol  400 Units Oral QHS   furosemide  40 mg Intravenous BID   ipratropium-albuterol  3 mL Nebulization Q4H   mouth rinse  15 mL Mouth Rinse q12n4p   methocarbamol  1,000 mg Oral QHS   methylPREDNISolone (SOLU-MEDROL) injection  60 mg Intravenous Q12H   metoprolol tartrate  25 mg Oral BID   multivitamin  1 tablet Oral BID   pantoprazole  40 mg Oral Daily   simvastatin  20 mg Oral q1800   vitamin B-12  1,000 mcg Oral Daily   vitamin C  1,000 mg Oral Daily   Continuous Infusions:  sodium chloride Stopped (05/28/19 0209)   ampicillin (OMNIPEN) IV Stopped (06/01/19 1136)     LOS: 5 days   Marylu Lund, MD [facility] Pager On Amion  If 7PM-7AM, please contact night-coverage 06/01/2019, 3:27 PM

## 2019-06-02 LAB — BASIC METABOLIC PANEL
Anion gap: 12 (ref 5–15)
BUN: 34 mg/dL — ABNORMAL HIGH (ref 8–23)
CO2: 29 mmol/L (ref 22–32)
Calcium: 8.3 mg/dL — ABNORMAL LOW (ref 8.9–10.3)
Chloride: 92 mmol/L — ABNORMAL LOW (ref 98–111)
Creatinine, Ser: 0.88 mg/dL (ref 0.61–1.24)
GFR calc Af Amer: >60 mL/min (ref >60)
GFR calc non Af Amer: >60 mL/min (ref >60)
Glucose, Bld: 223 mg/dL — ABNORMAL HIGH (ref 70–99)
Potassium: 4.4 mmol/L (ref 3.5–5.1)
Sodium: 133 mmol/L — ABNORMAL LOW (ref 135–145)

## 2019-06-02 LAB — MAGNESIUM: Magnesium: 1.9 mg/dL (ref 1.7–2.4)

## 2019-06-02 MED ORDER — CEFDINIR 300 MG PO CAPS: 300.0000 mg | ORAL_CAPSULE | Freq: Two times a day (BID) | ORAL | 0 refills | Status: AC

## 2019-06-02 MED ORDER — PREDNISONE 10 MG PO TABS: ORAL_TABLET | ORAL | 0 refills | Status: DC

## 2019-06-02 NOTE — TOC Transition Note (Signed)
Transition of Care [facility]) - CM/SW Discharge Note   Patient Details  Name: [name] MRN: [id number] Date of Birth: [birth year]  [facility] CM/SW Contact:  Joaquin Courts, RN Phone Number: 06/02/2019, 1:39 PM   Clinical Narrative:    CM spoke with patient who states he is active with palliative care who sends a nurse and he wishes to continue that service, does not want a HHRN.          Patient Goals and CMS Choice        Discharge Placement                       Discharge Plan and Services                                     Social Determinants of Health (SDOH) Interventions     Readmission Risk Interventions Readmission Risk Prevention Plan 05/28/2019  Transportation Screening Complete  Medication Review Press photographer) Complete  PCP or Specialist appointment within 3-5 days of discharge Not Complete  HRI or [place] Not Complete  HRI or Home Care Consult Pt Refusal Comments no indication  SW Recovery Care/Counseling Consult Complete  Palliative Care Screening Complete  [place] Not Applicable  Some recent data might be hidden

## 2019-06-02 NOTE — Care Management Important Message (Signed)
Important Message  Patient Details  Name: [name] MRN: [id number] Date of Birth: 02-24-1946   Medicare Important Message Given:  Yes     Joaquin Courts, RN 06/02/2019, 12:48 PM

## 2019-06-02 NOTE — Discharge Summary (Signed)
Physician Discharge Summary  [name] GYI:[id number] DOB: [birth year] DOA: 05/26/2019  PCP: [name], MD  Admit date: 05/26/2019 Discharge date: 06/02/2019  Admitted From: Home Disposition:  Home  Recommendations for Outpatient Follow-up:  1. 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  Patient states that he started having worsening shortness of breath, fever and chills since this afternoon. He had temperature 102 at home. He has dry cough, denies chest pain, runny nose or sore throat. Per report, he had oxygen desaturation to 70s% on nonrebreather, which improved to 98% on BiPAP. Patient denies nausea,vomiting, diarrhea, abdominal pain, symptoms of UTI. He denies dark stool or rectal bleeding currently. Of note, patient has history of GI bleeding, andgetting frequent blood transfusion. He refused GI evaluation recently.  Discharge Diagnoses:  Principal Problem:   Acute on chronic respiratory failure with hypoxia and hypercapnia (HCC) Active Problems:   Malignant neoplasm of bladder (HCC)   HYPERCHOLESTEROLEMIA   Anxiety   Essential hypertension   Macrocytic anemia   COPD with acute exacerbation (HCC)   Chronic diastolic CHF (congestive heart failure) (HCC)   DNR (do not resuscitate)   Elevated troponin   OSA (obstructive sleep apnea)   Sepsis (HCC)   COPD exacerbation (HCC)   Shortness of breath  Acute on chronic respiratory failure with hypoxia and hypercapnia due to COPD exacerbation and acute CHF: - COVID testing negative -  elevated d-dimer; CTA negative for PE - Most recent ABG: ph 7.391, pCO2 41, pO2 157 - Was on vanc/rocephin, transitioned to ampicillin - Inhaler: Atrovent inhaler, prn Xopenex inhaler while in hospital - Continued with Solu-Medrol 60 mg IV  - Urine S. pneumococcal antigen, resp viral panel, sputum Cx neg     - Pt reports being on 6LNC at home     - Minimal wheezing at present.     - improved with IV lasix per below     - this AM, pt reports feeling much better and is eager to go home     - Will provide prednisone taper on discharge  Sepsis from e coli bacteremia/GPR bacteremia, present on admit - source unclear, but likely necrotic tumor - leukocytosis, fever, tachycardia, tachypnea - CXR w/ metastatic disease/COPD - Bld Cx +GPR/e. coli x 2 - procal: 4     - Improvement with ampicillin, complete course of omnicef on discharge     - seem stable at this time  Malignant neoplasm of bladder - CTA showed an enlarged tumor size, hepatic metastases, progressive pulmonary metastasis.  - Patient is s/p radiation therapy, no surgery and chemotherapy per patient. - Palliative consult: "DNR DNI. Home with continuation of home based palliative care. Patient doesn't want hospice, doesn't want to talk about hospice services.  Continue current mode of care."      - Currently stable  HLD -Will continue on zocor as toleated  Anxiety: - cont on xanax  Essential hypertension:  - BP remains stable     - Continue current regimen as tolerated  Macrocytic anemia:  - Denies rectal bleeding or dark stool.  - Pt had symptomatic anemia which required 2 units pRBC transfusion. Pt has possible GI loss, but he denies  declined evaluation by surgery or GI - his plan is for intermittent transfusions going forward as needed     - hgb of 6.7 on 7/13, improved to over 8 with one unit PRBC     - Remains hemodynamically stable at  present  Acute on Chronic diastolic CHF:  - BNP 532, CXR did not show pulmonary edema. - Pt did note BLE edema following volume resuscitation      - Clinical improvement with IV lasix     - Pt to continue home PO lasix on d/c  OSA: - continued with BiPAP as tolerated while in hospital  Elevated troponin:  - Troponins are minimally elevated, 17/20.  - Denies any chest pain most likely due to demand ischemia secondary to sepsis and hypoxia. - No aspirin due to possible GI bleeding - Continue Zocor as tolerated   Discharge Instructions   Allergies as of 06/02/2019      Reactions   Amlodipine Swelling      Medication List    TAKE these medications   Albuterol Sulfate 108 (90 Base) MCG/ACT Aepb Commonly known as: ProAir RespiClick Inhale 2 puffs into the lungs every 6 (six) hours as needed (shortness of breath).   ALPRAZolam 0.5 MG tablet Commonly known as: XANAX Take 1 tablet (0.5 mg total) by mouth at bedtime.   budesonide 0.25 MG/2ML nebulizer solution Commonly known as: PULMICORT Take 2 mLs (0.25 mg total) by nebulization 2 (two) times daily.   cefdinir 300 MG capsule Commonly known as: OMNICEF Take 1 capsule (300 mg total) by mouth 2 (two) times daily for 7 days.   cholecalciferol 10 MCG (400 UNIT) Tabs tablet Commonly known as: VITAMIN D3 Take 400 Units by mouth at bedtime.   FLAX SEEDS PO Take 1 capsule by mouth daily.   fluticasone 50 MCG/ACT nasal spray Commonly known as: FLONASE Place 2 sprays into both nostrils daily as needed for allergies or rhinitis.   furosemide 40 MG tablet Commonly known as: LASIX Take 40 mg by mouth daily as needed for fluid.   guaiFENesin 600 MG 12 hr tablet Commonly known as: MUCINEX Take 1,200 mg by mouth 2 (two) times daily.   ipratropium-albuterol 0.5-2.5 (3) MG/3ML Soln Commonly known as: DUONEB Take 3 mLs by nebulization every 4 (four) hours as needed. What changed:   when to take  this  additional instructions   methocarbamol 500 MG tablet Commonly known as: ROBAXIN Take 1,000 mg by mouth at bedtime.   metoprolol tartrate 25 MG tablet Commonly known as: LOPRESSOR Take 25 mg by mouth 2 (two) times a day.   multivitamin-lutein Caps capsule Take 1 capsule by mouth 2 (two) times a day.   nitroGLYCERIN 0.4 MG SL tablet Commonly known as: NITROSTAT Place 1 tablet (0.4 mg total) under the tongue every 5 (five) minutes as needed for chest pain.   ondansetron 8 MG disintegrating tablet Commonly known as: ZOFRAN-ODT Take 8 mg by mouth every 8 (eight) hours as needed for nausea/vomiting.   pantoprazole 40 MG tablet Commonly known as: PROTONIX Take 40 mg by mouth daily.   predniSONE 10 MG tablet Commonly known as: DELTASONE Taper dose: 60mg  po daily x 3 days, then 40mg  po daily x 3 days, then 20mg  po daily x 3 days, then 10mg  po daily x 3 days, then 5mg  po daily x 2 days, then stop, zero refills.   simvastatin 20 MG tablet Commonly known as: ZOCOR Take 1 tablet (20 mg total) by mouth daily at 6  PM.   sodium chloride 0.65 % Soln nasal spray Commonly known as: OCEAN Place 1 spray into both nostrils as needed for congestion. What changed: when to take this   SYSTANE OP Apply 1 drop to eye every 6 (six) hours as needed (dry eyes).   traMADol 50 MG tablet Commonly known as: ULTRAM Take 1 tablet (50 mg total) by mouth every 6 (six) hours as needed for moderate pain.   vitamin B-12 1000 MCG tablet Commonly known as: CYANOCOBALAMIN Take 1,000 mcg by mouth daily.   vitamin C 1000 MG tablet Take 1,000 mg by mouth daily.      Follow-up Information    [name], MD. Schedule an appointment as soon as possible for a visit in 1 week(s).   Specialty: Internal Medicine Contact information: [place] [address] [phone]          Allergies  Allergen Reactions  . Amlodipine Swelling    Consultations:  Palliative  Care  Procedures/Studies: Dg Chest 2 View  Result Date: 05/06/2019 CLINICAL DATA:  Shortness of breath. Weakness. EXAM: CHEST - 2 VIEW COMPARISON:  Radiograph and CT 03/27/2019 FINDINGS: Heart is normal in size. Aortic atherosclerosis. Advanced emphysema with chronic hyperinflation. Multiple bilateral pulmonary nodules. At least 1 of these nodules is larger than on prior exam, nodule in the left mid lung measuring 2.3 cm, previously 1.7 cm. No pulmonary edema. No pneumothorax or pleural effusion. No acute osseous abnormalities. IMPRESSION: 1. Advanced emphysema. 2. Multiple bilateral pulmonary nodules, least 1 of which is increased in size since last month. Emphysema (ICD10-J43.9). Electronically Signed   By: Keith Rake M.D.   On: 05/06/2019 01:48   Ct Angio Chest Pe W And/or Wo Contrast  Result Date: 05/27/2019 CLINICAL DATA:  Shortness of breath, weakness, fever. 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 EXAM: CT ANGIOGRAPHY CHEST CT ABDOMEN AND PELVIS WITH CONTRAST TECHNIQUE: Multidetector CT imaging of the chest was performed using the standard protocol during bolus administration of intravenous contrast. Multiplanar CT image reconstructions and MIPs were obtained to evaluate the vascular anatomy. Multidetector CT imaging of the abdomen and pelvis was performed using the standard protocol during bolus administration of intravenous contrast. CONTRAST:  183mL OMNIPAQUE IOHEXOL 350 MG/ML SOLN COMPARISON:  03/27/2019. FINDINGS: CTA CHEST FINDINGS Cardiovascular: Motion degraded images. Within that constraint, there is satisfactory opacification of the bilateral pulmonary arteries to the lobar level. No evidence of pulmonary embolism. No evidence of thoracic aortic aneurysm. Atherosclerotic calcifications of the aortic arch. Heart is normal in size.  No pericardial effusion. Three vessel coronary atherosclerosis. Mediastinum/Nodes: No suspicious mediastinal  lymphadenopathy. Visualized thyroid is unremarkable. Lungs/Pleura: Bilateral pulmonary nodules/metastases, motion degraded but favored to be mildly progressive, including: --1.9 cm right upper lobe nodule (series 12/image 53), previously 1.5 cm --2.4 cm superior segment left lower lobe nodule (series 12/image 53), previously 1.8 cm --3.5 cm central right lower lobe mass (series 12/image 109), previously 2.6 cm Extensive centrilobular and paraseptal emphysematous changes, upper lung predominant. No focal consolidation. No pleural effusion or pneumothorax. Musculoskeletal: Degenerative changes of the thoracic spine. Review of the MIP images confirms the above findings. CT ABDOMEN and PELVIS FINDINGS Motion degraded images. Hepatobiliary: Embolization coils within a branch of the distal left hepatic artery. Partially calcified treated left hepatic lesion in segment 2 measures 5.2 x 3.7 cm, previously 4.8 x 3.7 cm, grossly unchanged. 3.5 x 2.9 cm hypodense lesion in segment 4A (series 4/image 11), previously 3.8 x 2.8 cm, unchanged. Additional  14 mm lesion inferiorly in the right hepatic lobe (series 4/image 27), similar to the prior. Gallbladder is unremarkable. No intrahepatic or extrahepatic ductal dilatation. Pancreas: Within normal limits. Spleen: Within normal limits. Adrenals/Urinary Tract: Adrenal glands are within normal limits. 6.8 cm right upper pole renal cyst. Left kidney is within normal limits. No hydronephrosis. Bladder is underdistended but unremarkable. Stomach/Bowel: Stomach is within normal limits. No evidence of bowel obstruction. Normal appendix (series 4/image 46). Left colon is decompressed. Mild sigmoid diverticulosis, without evidence of diverticulitis. Vascular/Lymphatic: No evidence of abdominal aortic aneurysm. Atherosclerotic calcifications of the abdominal aorta and branch vessels. No suspicious abdominopelvic lymphadenopathy. Reproductive: Prostate is unremarkable. Other: 16.7 x 26.0 x  18.7 cm mass in the right mid abdomen, previously 16.0 x 24.0 x 18.5 cm, mildly increased (calculated volume = 4250 mL versus 3720 mL on the prior). Interval development of necrosis with gas along the left superior aspect of the mass (series 4/image 45). No abdominopelvic ascites. Musculoskeletal: Degenerative changes of the visualized thoracolumbar spine, most prominent at L5-S1. Review of the MIP images confirms the above findings. IMPRESSION: No evidence of pulmonary embolism. 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 Status post chemoembolization. Hepatic metastases are grossly unchanged. Progressive pulmonary metastases, as described above, although poorly evaluated due to motion degradation. Aortic Atherosclerosis (ICD10-I70.0) and Emphysema (ICD10-J43.9). Electronically Signed   By: Julian Hy M.D.   On: 05/27/2019 03:48   Ct Abdomen Pelvis W Contrast  Result Date: 05/27/2019 CLINICAL DATA:  Shortness of breath, weakness, fever. 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 EXAM: CT ANGIOGRAPHY CHEST CT ABDOMEN AND PELVIS WITH CONTRAST TECHNIQUE: Multidetector CT imaging of the chest was performed using the standard protocol during bolus administration of intravenous contrast. Multiplanar CT image reconstructions and MIPs were obtained to evaluate the vascular anatomy. Multidetector CT imaging of the abdomen and pelvis was performed using the standard protocol during bolus administration of intravenous contrast. CONTRAST:  170mL OMNIPAQUE IOHEXOL 350 MG/ML SOLN COMPARISON:  03/27/2019. FINDINGS: CTA CHEST FINDINGS Cardiovascular: Motion degraded images. Within that constraint, there is satisfactory opacification of the bilateral pulmonary arteries to the lobar level. No evidence of pulmonary embolism. No evidence of thoracic aortic aneurysm. Atherosclerotic calcifications of the aortic arch. Heart is  normal in size.  No pericardial effusion. Three vessel coronary atherosclerosis. Mediastinum/Nodes: No suspicious mediastinal lymphadenopathy. Visualized thyroid is unremarkable. Lungs/Pleura: Bilateral pulmonary nodules/metastases, motion degraded but favored to be mildly progressive, including: --1.9 cm right upper lobe nodule (series 12/image 53), previously 1.5 cm --2.4 cm superior segment left lower lobe nodule (series 12/image 53), previously 1.8 cm --3.5 cm central right lower lobe mass (series 12/image 109), previously 2.6 cm Extensive centrilobular and paraseptal emphysematous changes, upper lung predominant. No focal consolidation. No pleural effusion or pneumothorax. Musculoskeletal: Degenerative changes of the thoracic spine. Review of the MIP images confirms the above findings. CT ABDOMEN and PELVIS FINDINGS Motion degraded images. Hepatobiliary: Embolization coils within a branch of the distal left hepatic artery. Partially calcified treated left hepatic lesion in segment 2 measures 5.2 x 3.7 cm, previously 4.8 x 3.7 cm, grossly unchanged. 3.5 x 2.9 cm hypodense lesion in segment 4A (series 4/image 11), previously 3.8 x 2.8 cm, unchanged. Additional 14 mm lesion inferiorly in the right hepatic lobe (series 4/image 27), similar to the prior. Gallbladder is unremarkable. No intrahepatic or extrahepatic ductal dilatation. Pancreas: Within normal limits. Spleen: Within normal limits. Adrenals/Urinary Tract: Adrenal glands are within  normal limits. 6.8 cm right upper pole renal cyst. Left kidney is within normal limits. No hydronephrosis. Bladder is underdistended but unremarkable. Stomach/Bowel: Stomach is within normal limits. No evidence of bowel obstruction. Normal appendix (series 4/image 46). Left colon is decompressed. Mild sigmoid diverticulosis, without evidence of diverticulitis. Vascular/Lymphatic: No evidence of abdominal aortic aneurysm. Atherosclerotic calcifications of the abdominal aorta and  branch vessels. No suspicious abdominopelvic lymphadenopathy. Reproductive: Prostate is unremarkable. Other: 16.7 x 26.0 x 18.7 cm mass in the right mid abdomen, previously 16.0 x 24.0 x 18.5 cm, mildly increased (calculated volume = 4250 mL versus 3720 mL on the prior). Interval development of necrosis with gas along the left superior aspect of the mass (series 4/image 45). No abdominopelvic ascites. Musculoskeletal: Degenerative changes of the visualized thoracolumbar spine, most prominent at L5-S1. Review of the MIP images confirms the above findings. IMPRESSION: No evidence of pulmonary embolism. 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 Status post chemoembolization. Hepatic metastases are grossly unchanged. Progressive pulmonary metastases, as described above, although poorly evaluated due to motion degradation. Aortic Atherosclerosis (ICD10-I70.0) and Emphysema (ICD10-J43.9). Electronically Signed   By: Julian Hy M.D.   On: 05/27/2019 03:48   Dg Chest Port 1 View  Result Date: 05/26/2019 CLINICAL DATA:  Shortness of breath and fever for several hours EXAM: PORTABLE CHEST 1 VIEW COMPARISON:  05/06/2019 FINDINGS: Cardiac shadow is stable. Aortic calcifications are seen. The lungs are well aerated bilaterally with emphysematous changes and crowding of the markings in the bases. Multiple bilateral pulmonary nodules are seen. Slight increased density in the bases is noted when compare with the prior exam likely representing some superimposed atelectatic change. IMPRESSION: Multifocal pulmonary nodules consistent with metastatic disease. Mild increased atelectasis in the bases superimposed over changes of COPD. Electronically Signed   By: Inez Catalina M.D.   On: 05/26/2019 23:46   Vas [place] Lower Extremity Venous (dvt)  Result Date: 05/28/2019  Lower Venous Study Indications: Edema. Other Indications: D dimer 3.3. Limitations: Poor  ultrasound/tissue interface. Comparison Study: no prior Performing Technologist: June Leap RDMS, RVT  Examination Guidelines: A complete evaluation includes B-mode imaging, spectral Doppler, color Doppler, and power Doppler as needed of all accessible portions of each vessel. Bilateral testing is considered an integral part of a complete examination. Limited examinations for reoccurring indications may be performed as noted.  +---------+---------------+---------+-----------+----------+--------------+ RIGHT    CompressibilityPhasicitySpontaneityPropertiesSummary        +---------+---------------+---------+-----------+----------+--------------+ CFV      Full           Yes      Yes                                 +---------+---------------+---------+-----------+----------+--------------+ SFJ      Full                                                        +---------+---------------+---------+-----------+----------+--------------+ FV Prox  Full                                                        +---------+---------------+---------+-----------+----------+--------------+  FV Mid   Full                                                        +---------+---------------+---------+-----------+----------+--------------+ FV DistalFull                                                        +---------+---------------+---------+-----------+----------+--------------+ PFV      Full                                                        +---------+---------------+---------+-----------+----------+--------------+ POP      Full           Yes      Yes                                 +---------+---------------+---------+-----------+----------+--------------+ PTV      Full                                                        +---------+---------------+---------+-----------+----------+--------------+ PERO                                                  Not  visualized +---------+---------------+---------+-----------+----------+--------------+   +---------+---------------+---------+-----------+----------+-------+ LEFT     CompressibilityPhasicitySpontaneityPropertiesSummary +---------+---------------+---------+-----------+----------+-------+ CFV      Full           Yes      Yes                          +---------+---------------+---------+-----------+----------+-------+ SFJ      Full                                                 +---------+---------------+---------+-----------+----------+-------+ FV Prox  Full                                                 +---------+---------------+---------+-----------+----------+-------+ FV Mid   Full                                                 +---------+---------------+---------+-----------+----------+-------+ FV DistalFull                                                 +---------+---------------+---------+-----------+----------+-------+  PFV      Full                                                 +---------+---------------+---------+-----------+----------+-------+ POP      Full           Yes      Yes                          +---------+---------------+---------+-----------+----------+-------+ PTV      Full                                                 +---------+---------------+---------+-----------+----------+-------+ PERO     Full                                                 +---------+---------------+---------+-----------+----------+-------+     Summary: Right: There is no evidence of deep vein thrombosis in the lower extremity. No cystic structure found in the popliteal fossa. Left: There is no evidence of deep vein thrombosis in the lower extremity. No cystic structure found in the popliteal fossa.  *See table(s) above for measurements and observations. Electronically signed by Monica Martinez MD on 05/28/2019 at 5:05:23 PM.    Final      Subjective: Eager to go home today  Discharge Exam: Vitals:   06/02/19 1138 06/02/19 1155  BP:    Pulse:    Resp:    Temp:  97.6 F (36.4 C)  SpO2: 100%    Vitals:   06/02/19 1000 06/02/19 1100 06/02/19 1138 06/02/19 1155  BP: (!) 149/60 (!) 152/64    Pulse: 69 69    Resp: 20 19    Temp:    97.6 F (36.4 C)  TempSrc:    Oral  SpO2: 100% 99% 100%   Weight:      Height:        General: Pt is alert, awake, not in acute distress Cardiovascular: RRR, S1/S2 +, no rubs, no gallops Respiratory: CTA bilaterally, no wheezing, no rhonchi Abdominal: Soft, NT, ND, bowel sounds + Extremities: no edema, no cyanosis   The results of significant diagnostics from this hospitalization (including imaging, microbiology, ancillary and laboratory) are listed below for reference.     Microbiology: Recent Results (from the past 240 hour(s))  SARS Coronavirus 2 (CEPHEID- Performed in [place] hospital lab), Hosp Order     Status: None   Collection Time: 05/26/19 11:28 PM   Specimen: Nasopharyngeal Swab  Result Value Ref Range Status   SARS Coronavirus 2 NEGATIVE NEGATIVE Final    Comment: (NOTE) If result is NEGATIVE SARS-CoV-2 target nucleic acids are NOT DETECTED. The SARS-CoV-2 RNA is generally detectable in upper and lower  respiratory specimens during the acute phase of infection. The lowest  concentration of SARS-CoV-2 viral copies this assay can detect is 250  copies / mL. A negative result does not preclude SARS-CoV-2 infection  and should not be used as the sole basis for treatment or other  patient management decisions.  A negative result may occur with  improper specimen collection / handling, submission of specimen other  than nasopharyngeal swab, presence of viral mutation(s) within the  areas targeted by this assay, and inadequate number of viral copies  (<250 copies / mL). A negative result must be combined with clinical  observations, patient history, and  epidemiological information. If result is POSITIVE SARS-CoV-2 target nucleic acids are DETECTED. The SARS-CoV-2 RNA is generally detectable in upper and lower  respiratory specimens dur ing the acute phase of infection.  Positive  results are indicative of active infection with SARS-CoV-2.  Clinical  correlation with patient history and other diagnostic information is  necessary to determine patient infection status.  Positive results do  not rule out bacterial infection or co-infection with other viruses. If result is PRESUMPTIVE POSTIVE SARS-CoV-2 nucleic acids MAY BE PRESENT.   A presumptive positive result was obtained on the submitted specimen  and confirmed on repeat testing.  While 2019 novel coronavirus  (SARS-CoV-2) nucleic acids may be present in the submitted sample  additional confirmatory testing may be necessary for epidemiological  and / or clinical management purposes  to differentiate between  SARS-CoV-2 and other Sarbecovirus currently known to infect humans.  If clinically indicated additional testing with an alternate test  methodology [id number]) is advised. The SARS-CoV-2 RNA is generally  detectable in upper and lower respiratory sp ecimens during the acute  phase of infection. The expected result is Negative. Fact Sheet for Patients:  StrictlyIdeas.no Fact Sheet for Healthcare Providers: BankingDealers.co.za This test is not yet approved or cleared by the [place] FDA and has been authorized for detection and/or diagnosis of SARS-CoV-2 by FDA under an Emergency Use Authorization (EUA).  This EUA will remain in effect (meaning this test can be used) for the duration of the COVID-19 declaration under Section 564(b)(1) of the Act, 21 U.S.C. section 360bbb-3(b)(1), unless the authorization is terminated or revoked sooner. Performed at [facility], [place] [address], [place], [place] [address]    Blood Culture (routine x 2)     Status: Abnormal   Collection Time: 05/26/19 11:29 PM   Specimen: BLOOD  Result Value Ref Range Status   Specimen Description BLOOD RIGHT ANTECUBITAL  Final   Special Requests   Final    BOTTLES DRAWN AEROBIC AND ANAEROBIC Blood Culture adequate volume   Culture  Setup Time   Final    GRAM NEGATIVE RODS IN BOTH AEROBIC AND ANAEROBIC BOTTLES CRITICAL VALUE NOTED.  VALUE IS CONSISTENT WITH PREVIOUSLY REPORTED AND CALLED VALUE. GRAM POSITIVE RODS ANAEROBIC BOTTLE ONLY Performed at [facility], [place] [address], [place], [place] [address]    Culture ESCHERICHIA COLI CLOSTRIDIUM PERFRINGENS  (A)  Final   Report Status 05/29/2019 FINAL  Final   Organism ID, Bacteria ESCHERICHIA COLI  Final      Susceptibility   Escherichia coli - MIC*    AMPICILLIN <=2 SENSITIVE Sensitive     CEFAZOLIN <=4 SENSITIVE Sensitive     CEFEPIME <=1 SENSITIVE Sensitive     CEFTAZIDIME <=1 SENSITIVE Sensitive     CEFTRIAXONE <=1 SENSITIVE Sensitive     CIPROFLOXACIN <=0.25 SENSITIVE Sensitive     GENTAMICIN <=1 SENSITIVE Sensitive     IMIPENEM <=0.25 SENSITIVE Sensitive     TRIMETH/SULFA <=20 SENSITIVE Sensitive     AMPICILLIN/SULBACTAM <=2 SENSITIVE Sensitive     PIP/TAZO <=4 SENSITIVE Sensitive     Extended ESBL NEGATIVE Sensitive     * ESCHERICHIA COLI  Blood Culture (routine x 2)  Status: Abnormal   Collection Time: 05/26/19 11:34 PM   Specimen: BLOOD  Result Value Ref Range Status   Specimen Description BLOOD LEFT ANTECUBITAL  Final   Special Requests   Final    BOTTLES DRAWN AEROBIC AND ANAEROBIC Blood Culture adequate volume   Culture  Setup Time   Final    GRAM NEGATIVE RODS IN BOTH AEROBIC AND ANAEROBIC BOTTLES CRITICAL RESULT CALLED TO, READ BACK BY AND VERIFIED WITH: PHARMD A. PHAM [phone] FCP GRAM POSITIVE RODS ANAEROBIC BOTTLE ONLY Performed at [facility], [place] [address], [place], [place] [address]    Culture ESCHERICHIA  COLI CLOSTRIDIUM PERFRINGENS  (A)  Final   Report Status 05/29/2019 FINAL  Final  Blood Culture ID Panel (Reflexed)     Status: Abnormal   Collection Time: 05/26/19 11:34 PM  Result Value Ref Range Status   Enterococcus species NOT DETECTED NOT DETECTED Final   Listeria monocytogenes NOT DETECTED NOT DETECTED Final   Staphylococcus species NOT DETECTED NOT DETECTED Final   Staphylococcus aureus (BCID) NOT DETECTED NOT DETECTED Final   Streptococcus species NOT DETECTED NOT DETECTED Final   Streptococcus agalactiae NOT DETECTED NOT DETECTED Final   Streptococcus pneumoniae NOT DETECTED NOT DETECTED Final   Streptococcus pyogenes NOT DETECTED NOT DETECTED Final   Acinetobacter baumannii NOT DETECTED NOT DETECTED Final   Enterobacteriaceae species DETECTED (A) NOT DETECTED Final    Comment: Enterobacteriaceae represent a large family of gram-negative bacteria, not a single organism. CRITICAL RESULT CALLED TO, READ BACK BY AND VERIFIED WITH: PHARMD A. PHAM [phone] FCP    Enterobacter cloacae complex NOT DETECTED NOT DETECTED Final   Escherichia coli DETECTED (A) NOT DETECTED Final    Comment: CRITICAL RESULT CALLED TO, READ BACK BY AND VERIFIED WITH: PHARMD A. PHAM [phone] [id number] FCP    Klebsiella oxytoca NOT DETECTED NOT DETECTED Final   Klebsiella pneumoniae NOT DETECTED NOT DETECTED Final   Proteus species NOT DETECTED NOT DETECTED Final   Serratia marcescens NOT DETECTED NOT DETECTED Final   Carbapenem resistance NOT DETECTED NOT DETECTED Final   Haemophilus influenzae NOT DETECTED NOT DETECTED Final   Neisseria meningitidis NOT DETECTED NOT DETECTED Final   Pseudomonas aeruginosa NOT DETECTED NOT DETECTED Final   Candida albicans NOT DETECTED NOT DETECTED Final   Candida glabrata NOT DETECTED NOT DETECTED Final   Candida krusei NOT DETECTED NOT DETECTED Final   Candida parapsilosis NOT DETECTED NOT DETECTED Final   Candida tropicalis NOT DETECTED NOT DETECTED Final     Comment: Performed at [facility], [place] [address], [place], [place] [address]  Urine culture     Status: None   Collection Time: 05/27/19  1:54 AM   Specimen: In/Out Cath Urine  Result Value Ref Range Status   Specimen Description   Final    IN/OUT CATH URINE Performed at [place] [address] [place], [place] [address]    Special Requests   Final    NONE Performed at [facility], [place] [address] [place], [place] [address]    Culture   Final    NO GROWTH Performed at [facility], [place] [address], [place], [place] [address]    Report Status 05/28/2019 FINAL  Final  MRSA PCR Screening     Status: None   Collection Time: 05/27/19  5:29 AM   Specimen: Nasal Mucosa; Nasopharyngeal  Result Value Ref Range Status   MRSA by PCR NEGATIVE NEGATIVE Final  Comment:        The GeneXpert MRSA Assay (FDA approved for NASAL specimens only), is one component of a comprehensive MRSA colonization surveillance program. It is not intended to diagnose MRSA infection nor to guide or monitor treatment for MRSA infections. Performed at [facility], [place] [address] [place], [place] [address]   Respiratory Panel by PCR     Status: None   Collection Time: 05/28/19 12:30 AM   Specimen: Nasopharyngeal Swab; Respiratory  Result Value Ref Range Status   Adenovirus NOT DETECTED NOT DETECTED Final   Coronavirus 229E NOT DETECTED NOT DETECTED Final    Comment: (NOTE) The Coronavirus on the Respiratory Panel, DOES NOT test for the novel  Coronavirus (2019 nCoV)    Coronavirus HKU1 NOT DETECTED NOT DETECTED Final   Coronavirus NL63 NOT DETECTED NOT DETECTED Final   Coronavirus OC43 NOT DETECTED NOT DETECTED Final   Metapneumovirus NOT DETECTED NOT DETECTED Final   Rhinovirus / Enterovirus NOT DETECTED NOT DETECTED Final   Influenza A NOT DETECTED NOT DETECTED Final   Influenza B NOT DETECTED NOT DETECTED Final    Parainfluenza Virus 1 NOT DETECTED NOT DETECTED Final   Parainfluenza Virus 2 NOT DETECTED NOT DETECTED Final   Parainfluenza Virus 3 NOT DETECTED NOT DETECTED Final   Parainfluenza Virus 4 NOT DETECTED NOT DETECTED Final   Respiratory Syncytial Virus NOT DETECTED NOT DETECTED Final   Bordetella pertussis NOT DETECTED NOT DETECTED Final   Chlamydophila pneumoniae NOT DETECTED NOT DETECTED Final   Mycoplasma pneumoniae NOT DETECTED NOT DETECTED Final    Comment: Performed at [facility], [place]. [address] [place], [place] [address]  Culture, blood (routine x 2)     Status: None (Preliminary result)   Collection Time: 05/29/19  4:07 PM   Specimen: BLOOD  Result Value Ref Range Status   Specimen Description   Final    BLOOD LEFT ANTECUBITAL Performed at [place] [address] [place], [place] [address]    Special Requests   Final    BOTTLES DRAWN AEROBIC ONLY Blood Culture adequate volume Performed at [place] [address], [place], [place] [address]    Culture   Final    NO GROWTH 4 DAYS Performed at [facility], [place] [address], [place], [place] [address]    Report Status PENDING  Incomplete  Culture, blood (routine x 2)     Status: None (Preliminary result)   Collection Time: 05/29/19  4:16 PM   Specimen: BLOOD RIGHT HAND  Result Value Ref Range Status   Specimen Description   Final    BLOOD RIGHT HAND Performed at [place] [address], [place], [place] [address]    Special Requests   Final    BOTTLES DRAWN AEROBIC ONLY Blood Culture results may not be optimal due to an inadequate volume of blood received in culture bottles Performed at [place] [address] [place], [place] [address]    Culture   Final    NO GROWTH 4 DAYS Performed at [facility], [place] [address], [place], [place] [address]    Report Status PENDING  Incomplete     Labs: BNP (last 3  results) Recent Labs    02/18/19 1215 05/06/19 0101 05/26/19 2327  BNP 215.3* 330.7* 818.2*   Basic Metabolic Panel: Recent Labs  Lab 05/28/19 0239 05/29/19 9937 05/30/19 1696 05/31/19 7893 06/01/19 0222 06/02/19 0326  NA 135 135 136 133* 132* 133*  K 4.3 4.4 4.5 4.2 4.3 4.4  CL 102 103 103 99 93* 92*  CO2 22 23 25 24 26 29   GLUCOSE 183* 197* 202* 197* 220* 223*  BUN 30* 26* 28* 27* 28* 34*  CREATININE 0.82 0.75 0.76 0.87 0.89 0.88  CALCIUM 8.0* 8.2* 8.5* 7.9* 7.9* 8.3*  MG 2.0 2.5* 2.5*  --   --  1.9  PHOS 3.2 3.1 3.0  --   --   --    Liver Function Tests: Recent Labs  Lab 05/26/19 2321 05/28/19 0239 05/29/19 0722 05/30/19 0237 05/31/19 0219 06/01/19 0222  AST 59*  --   --   --  67* 68*  ALT 47*  --   --   --  97* 118*  ALKPHOS 119  --   --   --  130* 121  BILITOT 4.4*  --   --   --  1.1 1.1  PROT 6.8  --   --   --  6.1* 5.9*  ALBUMIN 3.3* 3.1* 2.9* 3.1* 2.8* 2.8*   Recent Labs  Lab 05/26/19 2321  LIPASE 31   No results for input(s): AMMONIA in the last 168 hours. CBC: Recent Labs  Lab 05/26/19 2321 05/28/19 0239 05/28/19 1728 05/29/19 0722 05/30/19 0237 05/31/19 0219 06/01/19 0222  WBC 10.7* 13.0*  --  13.3* 13.6* 13.4* 11.9*  NEUTROABS 9.7* 11.6*  --  12.2* 12.9*  --   --   HGB 7.6* 6.7* 8.7* 8.3* 8.7* 9.1* 9.6*  HCT 25.3* 21.8* 27.7* 25.9* 28.1* 29.8* 30.2*  MCV 100.0 98.6  --  97.7 99.3 99.0 97.7  PLT 199 137*  --  124* 135* 143* 142*   Cardiac Enzymes: No results for input(s): CKTOTAL, CKMB, CKMBINDEX, TROPONINI in the last 168 hours. BNP: Invalid input(s): POCBNP CBG: No results for input(s): GLUCAP in the last 168 hours. D-Dimer No results for input(s): DDIMER in the last 72 hours. Hgb A1c No results for input(s): HGBA1C in the last 72 hours. Lipid Profile No results for input(s): CHOL, HDL, LDLCALC, TRIG, CHOLHDL, LDLDIRECT in the last 72 hours. Thyroid function studies No results for input(s): TSH, T4TOTAL, T3FREE, THYROIDAB in  the last 72 hours.  Invalid input(s): FREET3 Anemia work up No results for input(s): VITAMINB12, FOLATE, FERRITIN, TIBC, IRON, RETICCTPCT in the last 72 hours. Urinalysis    Component Value Date/Time   COLORURINE AMBER (A) 05/27/2019 0154   APPEARANCEUR CLEAR 05/27/2019 0154   LABSPEC 1.032 (H) 05/27/2019 0154   PHURINE 5.0 05/27/2019 0154   GLUCOSEU NEGATIVE 05/27/2019 0154   GLUCOSEU NEGATIVE 06/30/2010 1050   HGBUR NEGATIVE 05/27/2019 0154   BILIRUBINUR SMALL (A) 05/27/2019 0154   KETONESUR NEGATIVE 05/27/2019 0154   PROTEINUR 30 (A) 05/27/2019 0154   UROBILINOGEN 0.2 06/30/2010 1050   NITRITE NEGATIVE 05/27/2019 0154   LEUKOCYTESUR NEGATIVE 05/27/2019 0154   Sepsis Labs Invalid input(s): PROCALCITONIN,  WBC,  LACTICIDVEN Microbiology Recent Results (from the past 240 hour(s))  SARS Coronavirus 2 (CEPHEID- Performed in [facility]), Hosp Order     Status: None   Collection Time: 05/26/19 11:28 PM   Specimen: Nasopharyngeal Swab  Result Value Ref Range Status   SARS Coronavirus 2 NEGATIVE NEGATIVE Final    Comment: (NOTE) If result is NEGATIVE SARS-CoV-2 target nucleic acids are NOT DETECTED. The SARS-CoV-2 RNA is generally detectable in upper and lower  respiratory specimens during the acute phase of infection. The lowest  concentration of  SARS-CoV-2 viral copies this assay can detect is 250  copies / mL. A negative result does not preclude SARS-CoV-2 infection  and should not be used as the sole basis for treatment or other  patient management decisions.  A negative result may occur with  improper specimen collection / handling, submission of specimen other  than nasopharyngeal swab, presence of viral mutation(s) within the  areas targeted by this assay, and inadequate number of viral copies  (<250 copies / mL). A negative result must be combined with clinical  observations, patient history, and epidemiological information. If result is  POSITIVE SARS-CoV-2 target nucleic acids are DETECTED. The SARS-CoV-2 RNA is generally detectable in upper and lower  respiratory specimens dur ing the acute phase of infection.  Positive  results are indicative of active infection with SARS-CoV-2.  Clinical  correlation with patient history and other diagnostic information is  necessary to determine patient infection status.  Positive results do  not rule out bacterial infection or co-infection with other viruses. If result is PRESUMPTIVE POSTIVE SARS-CoV-2 nucleic acids MAY BE PRESENT.   A presumptive positive result was obtained on the submitted specimen  and confirmed on repeat testing.  While 2019 novel coronavirus  (SARS-CoV-2) nucleic acids may be present in the submitted sample  additional confirmatory testing may be necessary for epidemiological  and / or clinical management purposes  to differentiate between  SARS-CoV-2 and other Sarbecovirus currently known to infect humans.  If clinically indicated additional testing with an alternate test  methodology [phone]) is advised. The SARS-CoV-2 RNA is generally  detectable in upper and lower respiratory sp ecimens during the acute  phase of infection. The expected result is Negative. Fact Sheet for Patients:  StrictlyIdeas.no Fact Sheet for Healthcare Providers: BankingDealers.co.za This test is not yet approved or cleared by the [place] FDA and has been authorized for detection and/or diagnosis of SARS-CoV-2 by FDA under an Emergency Use Authorization (EUA).  This EUA will remain in effect (meaning this test can be used) for the duration of the COVID-19 declaration under Section 564(b)(1) of the Act, 21 U.S.C. section 360bbb-3(b)(1), unless the authorization is terminated or revoked sooner. Performed at [facility], [place] [address] [place], [place] [address]   Blood Culture (routine x 2)     Status:  Abnormal   Collection Time: 05/26/19 11:29 PM   Specimen: BLOOD  Result Value Ref Range Status   Specimen Description BLOOD RIGHT ANTECUBITAL  Final   Special Requests   Final    BOTTLES DRAWN AEROBIC AND ANAEROBIC Blood Culture adequate volume   Culture  Setup Time   Final    GRAM NEGATIVE RODS IN BOTH AEROBIC AND ANAEROBIC BOTTLES CRITICAL VALUE NOTED.  VALUE IS CONSISTENT WITH PREVIOUSLY REPORTED AND CALLED VALUE. GRAM POSITIVE RODS ANAEROBIC BOTTLE ONLY Performed at [facility], [place] [address], [place], [place] [address]    Culture ESCHERICHIA COLI CLOSTRIDIUM PERFRINGENS  (A)  Final   Report Status 05/29/2019 FINAL  Final   Organism ID, Bacteria ESCHERICHIA COLI  Final      Susceptibility   Escherichia coli - MIC*    AMPICILLIN <=2 SENSITIVE Sensitive     CEFAZOLIN <=4 SENSITIVE Sensitive     CEFEPIME <=1 SENSITIVE Sensitive     CEFTAZIDIME <=1 SENSITIVE Sensitive     CEFTRIAXONE <=1 SENSITIVE Sensitive     CIPROFLOXACIN <=0.25 SENSITIVE Sensitive     GENTAMICIN <=1 SENSITIVE Sensitive     IMIPENEM <=0.25 SENSITIVE Sensitive  TRIMETH/SULFA <=20 SENSITIVE Sensitive     AMPICILLIN/SULBACTAM <=2 SENSITIVE Sensitive     PIP/TAZO <=4 SENSITIVE Sensitive     Extended ESBL NEGATIVE Sensitive     * ESCHERICHIA COLI  Blood Culture (routine x 2)     Status: Abnormal   Collection Time: 05/26/19 11:34 PM   Specimen: BLOOD  Result Value Ref Range Status   Specimen Description BLOOD LEFT ANTECUBITAL  Final   Special Requests   Final    BOTTLES DRAWN AEROBIC AND ANAEROBIC Blood Culture adequate volume   Culture  Setup Time   Final    GRAM NEGATIVE RODS IN BOTH AEROBIC AND ANAEROBIC BOTTLES CRITICAL RESULT CALLED TO, READ BACK BY AND VERIFIED WITH: PHARMD A. PHAM [phone] FCP GRAM POSITIVE RODS ANAEROBIC BOTTLE ONLY Performed at [facility], [place] [address], [place], [place] [address]    Culture ESCHERICHIA COLI CLOSTRIDIUM PERFRINGENS  (A)  Final   Report  Status 05/29/2019 FINAL  Final  Blood Culture ID Panel (Reflexed)     Status: Abnormal   Collection Time: 05/26/19 11:34 PM  Result Value Ref Range Status   Enterococcus species NOT DETECTED NOT DETECTED Final   Listeria monocytogenes NOT DETECTED NOT DETECTED Final   Staphylococcus species NOT DETECTED NOT DETECTED Final   Staphylococcus aureus (BCID) NOT DETECTED NOT DETECTED Final   Streptococcus species NOT DETECTED NOT DETECTED Final   Streptococcus agalactiae NOT DETECTED NOT DETECTED Final   Streptococcus pneumoniae NOT DETECTED NOT DETECTED Final   Streptococcus pyogenes NOT DETECTED NOT DETECTED Final   Acinetobacter baumannii NOT DETECTED NOT DETECTED Final   Enterobacteriaceae species DETECTED (A) NOT DETECTED Final    Comment: Enterobacteriaceae represent a large family of gram-negative bacteria, not a single organism. CRITICAL RESULT CALLED TO, READ BACK BY AND VERIFIED WITH: PHARMD A. PHAM [phone] FCP    Enterobacter cloacae complex NOT DETECTED NOT DETECTED Final   Escherichia coli DETECTED (A) NOT DETECTED Final    Comment: CRITICAL RESULT CALLED TO, READ BACK BY AND VERIFIED WITH: PHARMD A. PHAM [phone] FCP    Klebsiella oxytoca NOT DETECTED NOT DETECTED Final   Klebsiella pneumoniae NOT DETECTED NOT DETECTED Final   Proteus species NOT DETECTED NOT DETECTED Final   Serratia marcescens NOT DETECTED NOT DETECTED Final   Carbapenem resistance NOT DETECTED NOT DETECTED Final   Haemophilus influenzae NOT DETECTED NOT DETECTED Final   Neisseria meningitidis NOT DETECTED NOT DETECTED Final   Pseudomonas aeruginosa NOT DETECTED NOT DETECTED Final   Candida albicans NOT DETECTED NOT DETECTED Final   Candida glabrata NOT DETECTED NOT DETECTED Final   Candida krusei NOT DETECTED NOT DETECTED Final   Candida parapsilosis NOT DETECTED NOT DETECTED Final   Candida tropicalis NOT DETECTED NOT DETECTED Final    Comment: Performed at [facility], [place] [address] [place], [place] [address]  Urine culture     Status: None   Collection Time: 05/27/19  1:54 AM   Specimen: In/Out Cath Urine  Result Value Ref Range Status   Specimen Description   Final    IN/OUT CATH URINE Performed at [place] [address], [place], [place] [address]    Special Requests   Final    NONE Performed at [facility], [place] [address] [place], [place] [address]    Culture   Final    NO GROWTH Performed at [facility], [place] [address], [place], [place] [address]  Report Status 05/28/2019 FINAL  Final  MRSA PCR Screening     Status: None   Collection Time: 05/27/19  5:29 AM   Specimen: Nasal Mucosa; Nasopharyngeal  Result Value Ref Range Status   MRSA by PCR NEGATIVE NEGATIVE Final    Comment:        The GeneXpert MRSA Assay (FDA approved for NASAL specimens only), is one component of a comprehensive MRSA colonization surveillance program. It is not intended to diagnose MRSA infection nor to guide or monitor treatment for MRSA infections. Performed at [facility], [place] [address] [place], [place] [address]   Respiratory Panel by PCR     Status: None   Collection Time: 05/28/19 12:30 AM   Specimen: Nasopharyngeal Swab; Respiratory  Result Value Ref Range Status   Adenovirus NOT DETECTED NOT DETECTED Final   Coronavirus 229E NOT DETECTED NOT DETECTED Final    Comment: (NOTE) The Coronavirus on the Respiratory Panel, DOES NOT test for the novel  Coronavirus (2019 nCoV)    Coronavirus HKU1 NOT DETECTED NOT DETECTED Final   Coronavirus NL63 NOT DETECTED NOT DETECTED Final   Coronavirus OC43 NOT DETECTED NOT DETECTED Final   Metapneumovirus NOT DETECTED NOT DETECTED Final   Rhinovirus / Enterovirus NOT DETECTED NOT DETECTED Final   Influenza A NOT DETECTED NOT DETECTED Final   Influenza B NOT DETECTED NOT DETECTED Final   Parainfluenza Virus 1 NOT DETECTED NOT DETECTED Final    Parainfluenza Virus 2 NOT DETECTED NOT DETECTED Final   Parainfluenza Virus 3 NOT DETECTED NOT DETECTED Final   Parainfluenza Virus 4 NOT DETECTED NOT DETECTED Final   Respiratory Syncytial Virus NOT DETECTED NOT DETECTED Final   Bordetella pertussis NOT DETECTED NOT DETECTED Final   Chlamydophila pneumoniae NOT DETECTED NOT DETECTED Final   Mycoplasma pneumoniae NOT DETECTED NOT DETECTED Final    Comment: Performed at [facility], [place]. [address], [place], [place] [address]  Culture, blood (routine x 2)     Status: None (Preliminary result)   Collection Time: 05/29/19  4:07 PM   Specimen: BLOOD  Result Value Ref Range Status   Specimen Description   Final    BLOOD LEFT ANTECUBITAL Performed at [place] [address], [place], [place] [address]    Special Requests   Final    BOTTLES DRAWN AEROBIC ONLY Blood Culture adequate volume Performed at [place] [address] [place], [place] [address]    Culture   Final    NO GROWTH 4 DAYS Performed at [facility], [place] [address] [place], [place] [address]    Report Status PENDING  Incomplete  Culture, blood (routine x 2)     Status: None (Preliminary result)   Collection Time: 05/29/19  4:16 PM   Specimen: BLOOD RIGHT HAND  Result Value Ref Range Status   Specimen Description   Final    BLOOD RIGHT HAND Performed at [place] [address], [place], [place] [address]    Special Requests   Final    BOTTLES DRAWN AEROBIC ONLY Blood Culture results may not be optimal due to an inadequate volume of blood received in culture bottles Performed at [place] [address], [place], [place] [address]    Culture   Final    NO GROWTH 4 DAYS Performed at [facility], [place] [address] [place], [place] [address]    Report Status PENDING  Incomplete  Time spent: 84min  SIGNED:   Marylu Lund, MD  [facility] 06/02/2019, 2:11 PM  If 7PM-7AM, please contact night-coverage

## 2019-06-03 LAB — CULTURE, BLOOD (ROUTINE X 2)
Culture: NO GROWTH
Culture: NO GROWTH
Special Requests: ADEQUATE

## 2019-06-04 ENCOUNTER — Telehealth: Payer: Self-pay | Admitting: *Deleted

## 2019-06-04 NOTE — Telephone Encounter (Signed)
Contacted and spoke with patient to arrange home visit this week s/p recent hospital discharge. Visit scheduled for tomorrow 06/05/19 at 10:30a

## 2019-06-05 ENCOUNTER — Other Ambulatory Visit: Payer: Self-pay

## 2019-06-05 ENCOUNTER — Other Ambulatory Visit: Payer: Medicare Other | Admitting: *Deleted

## 2019-06-05 DIAGNOSIS — Z515 Encounter for palliative care: Secondary | ICD-10-CM

## 2019-06-05 NOTE — Progress Notes (Signed)
COMMUNITY PALLIATIVE CARE RN NOTE  PATIENT NAME: [name] DOB: 03/13/1946 MRN: [id number]  PRIMARY CARE PROVIDER: [name], MD  RESPONSIBLE PARTY: Cheri Kearns (wife) Acct ID - Guarantor Home Phone Work Phone Relationship Acct Type  [id number] [name]  Self P/F     [address], [place], [place] [address]   Covid-19 Pre-screening Negative  PLAN OF CARE and INTERVENTION:  1. ADVANCE CARE PLANNING/GOALS OF CARE: Goal is for patient to remain at home with his wife. He is a DNR. 2. PATIENT/CAREGIVER EDUCATION: Pain Management and Respiratory Symptom Management 3. DISEASE STATUS: Met with patient and his wife, [name], in there home. Patient sitting outside on the porch where visit took place. He remains alert and oriented x 3, but is forgetful. He reports that yesterday his abdomen pain was significant and tender to touch. He required PRN Tramadol x 2 which was effective. Pain better today. Occasional non-productive cough noted, which does cause brief abdominal pain. He was recently hospitalized from 05/26/19 - 06/02/19 d/t c/o increased weakness, shortness of breath, hypoxia and fever. He was diagnosed with a COPD exacerbation and sepsis from e coli bacteremia. 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  He was treated in ICU with IV antibiotics and steroids. Hgb was 6.7. He received 1 unit of PRBCs. Hgb and on discharge 9.6 He was discharged on oral Cefdinir and Prednisone taper. He has lab work scheduled for Thursday 06/07/19 and a virtual visit scheduled with his PCP on Monday 06/11/19 for follow up. He denies any recent GI bleeding. He reports having an appetite today, but yesterday was poor. He continues on Oxygen 6L/min via [place]. He requires minimal assistance with bathing and dressing. He was started on Metoprolol for elevated HR. Will continue to  monitor.   HISTORY OF PRESENT ILLNESS:  This is a 73 yo male who resides at home with his wife. Palliative Care Team continues to follow patient. Will continue to visit patient bi-weekly and PRN.  CODE STATUS: DNR ADVANCED DIRECTIVES: N MOST FORM: no PPS: 50%   PHYSICAL EXAM:   VITALS: Today's Vitals   06/05/19 1111  BP: (!) 109/59  Pulse: 72  Resp: 20  Temp: (!) 96.8 F (36 C)  TempSrc: Temporal  SpO2: 98%  PainSc: 2   PainLoc: Abdomen    LUNGS: clear to auscultation  CARDIAC: Cor RRR EXTREMITIES: Trace edema to bilateral lower extremities SKIN: Scattered bruising noted to bilateral arms  NEURO: Alert and oriented x 3, pleasant mood, generalized weakness, ambulatory   (Duration of visit and documentation 75 minutes)    [name], RN BSN

## 2019-06-07 DIAGNOSIS — D62 Acute posthemorrhagic anemia: Secondary | ICD-10-CM | POA: Diagnosis not present

## 2019-06-10 ENCOUNTER — Other Ambulatory Visit: Payer: Self-pay

## 2019-06-10 ENCOUNTER — Encounter (HOSPITAL_COMMUNITY): Payer: Self-pay

## 2019-06-10 ENCOUNTER — Emergency Department (HOSPITAL_COMMUNITY): Payer: Medicare Other

## 2019-06-10 ENCOUNTER — Inpatient Hospital Stay (HOSPITAL_COMMUNITY)
Admission: EM | Admit: 2019-06-10 | Discharge: 2019-06-19 | DRG: 871 | Disposition: A | Discharge status: Home Health | Payer: Medicare Other | Attending: Internal Medicine | Admitting: Internal Medicine

## 2019-06-10 DIAGNOSIS — Z9221 Personal history of antineoplastic chemotherapy: Secondary | ICD-10-CM

## 2019-06-10 DIAGNOSIS — Z20828 Contact with and (suspected) exposure to other viral communicable diseases: Secondary | ICD-10-CM | POA: Diagnosis present

## 2019-06-10 DIAGNOSIS — R7989 Other specified abnormal findings of blood chemistry: Secondary | ICD-10-CM | POA: Diagnosis not present

## 2019-06-10 DIAGNOSIS — Z66 Do not resuscitate: Secondary | ICD-10-CM | POA: Diagnosis present

## 2019-06-10 DIAGNOSIS — J9620 Acute and chronic respiratory failure, unspecified whether with hypoxia or hypercapnia: Secondary | ICD-10-CM | POA: Diagnosis present

## 2019-06-10 DIAGNOSIS — Y95 Nosocomial condition: Secondary | ICD-10-CM | POA: Diagnosis not present

## 2019-06-10 DIAGNOSIS — Z809 Family history of malignant neoplasm, unspecified: Secondary | ICD-10-CM

## 2019-06-10 DIAGNOSIS — D649 Anemia, unspecified: Secondary | ICD-10-CM

## 2019-06-10 DIAGNOSIS — E785 Hyperlipidemia, unspecified: Secondary | ICD-10-CM | POA: Diagnosis present

## 2019-06-10 DIAGNOSIS — R0902 Hypoxemia: Secondary | ICD-10-CM | POA: Diagnosis not present

## 2019-06-10 DIAGNOSIS — I420 Dilated cardiomyopathy: Secondary | ICD-10-CM | POA: Diagnosis present

## 2019-06-10 DIAGNOSIS — M1990 Unspecified osteoarthritis, unspecified site: Secondary | ICD-10-CM | POA: Diagnosis present

## 2019-06-10 DIAGNOSIS — A4159 Other Gram-negative sepsis: Secondary | ICD-10-CM | POA: Diagnosis not present

## 2019-06-10 DIAGNOSIS — Z923 Personal history of irradiation: Secondary | ICD-10-CM

## 2019-06-10 DIAGNOSIS — I110 Hypertensive heart disease with heart failure: Secondary | ICD-10-CM | POA: Diagnosis present

## 2019-06-10 DIAGNOSIS — J189 Pneumonia, unspecified organism: Secondary | ICD-10-CM | POA: Diagnosis not present

## 2019-06-10 DIAGNOSIS — C787 Secondary malignant neoplasm of liver and intrahepatic bile duct: Secondary | ICD-10-CM | POA: Diagnosis present

## 2019-06-10 DIAGNOSIS — I2510 Atherosclerotic heart disease of native coronary artery without angina pectoris: Secondary | ICD-10-CM | POA: Diagnosis present

## 2019-06-10 DIAGNOSIS — I5032 Chronic diastolic (congestive) heart failure: Secondary | ICD-10-CM | POA: Diagnosis present

## 2019-06-10 DIAGNOSIS — Z8249 Family history of ischemic heart disease and other diseases of the circulatory system: Secondary | ICD-10-CM

## 2019-06-10 DIAGNOSIS — C679 Malignant neoplasm of bladder, unspecified: Secondary | ICD-10-CM | POA: Diagnosis present

## 2019-06-10 DIAGNOSIS — F419 Anxiety disorder, unspecified: Secondary | ICD-10-CM | POA: Diagnosis not present

## 2019-06-10 DIAGNOSIS — I471 Supraventricular tachycardia: Secondary | ICD-10-CM | POA: Diagnosis not present

## 2019-06-10 DIAGNOSIS — E7800 Pure hypercholesterolemia, unspecified: Secondary | ICD-10-CM | POA: Diagnosis present

## 2019-06-10 DIAGNOSIS — J9601 Acute respiratory failure with hypoxia: Secondary | ICD-10-CM

## 2019-06-10 DIAGNOSIS — Z7951 Long term (current) use of inhaled steroids: Secondary | ICD-10-CM | POA: Diagnosis not present

## 2019-06-10 DIAGNOSIS — C7802 Secondary malignant neoplasm of left lung: Secondary | ICD-10-CM | POA: Diagnosis present

## 2019-06-10 DIAGNOSIS — R0602 Shortness of breath: Secondary | ICD-10-CM | POA: Diagnosis not present

## 2019-06-10 DIAGNOSIS — I10 Essential (primary) hypertension: Secondary | ICD-10-CM | POA: Diagnosis present

## 2019-06-10 DIAGNOSIS — C7801 Secondary malignant neoplasm of right lung: Secondary | ICD-10-CM | POA: Diagnosis present

## 2019-06-10 DIAGNOSIS — Z888 Allergy status to other drugs, medicaments and biological substances status: Secondary | ICD-10-CM

## 2019-06-10 DIAGNOSIS — Z7952 Long term (current) use of systemic steroids: Secondary | ICD-10-CM | POA: Diagnosis not present

## 2019-06-10 DIAGNOSIS — E871 Hypo-osmolality and hyponatremia: Secondary | ICD-10-CM | POA: Diagnosis not present

## 2019-06-10 DIAGNOSIS — Z87891 Personal history of nicotine dependence: Secondary | ICD-10-CM

## 2019-06-10 DIAGNOSIS — G4733 Obstructive sleep apnea (adult) (pediatric): Secondary | ICD-10-CM | POA: Diagnosis present

## 2019-06-10 DIAGNOSIS — I5033 Acute on chronic diastolic (congestive) heart failure: Secondary | ICD-10-CM | POA: Diagnosis present

## 2019-06-10 DIAGNOSIS — A419 Sepsis, unspecified organism: Secondary | ICD-10-CM | POA: Diagnosis present

## 2019-06-10 DIAGNOSIS — Z515 Encounter for palliative care: Secondary | ICD-10-CM | POA: Diagnosis not present

## 2019-06-10 DIAGNOSIS — Z9981 Dependence on supplemental oxygen: Secondary | ICD-10-CM | POA: Diagnosis not present

## 2019-06-10 DIAGNOSIS — J449 Chronic obstructive pulmonary disease, unspecified: Secondary | ICD-10-CM | POA: Diagnosis not present

## 2019-06-10 DIAGNOSIS — J9621 Acute and chronic respiratory failure with hypoxia: Secondary | ICD-10-CM | POA: Diagnosis not present

## 2019-06-10 DIAGNOSIS — R609 Edema, unspecified: Secondary | ICD-10-CM

## 2019-06-10 DIAGNOSIS — R778 Other specified abnormalities of plasma proteins: Secondary | ICD-10-CM

## 2019-06-10 DIAGNOSIS — R0689 Other abnormalities of breathing: Secondary | ICD-10-CM | POA: Diagnosis not present

## 2019-06-10 DIAGNOSIS — R509 Fever, unspecified: Secondary | ICD-10-CM

## 2019-06-10 DIAGNOSIS — J9690 Respiratory failure, unspecified, unspecified whether with hypoxia or hypercapnia: Secondary | ICD-10-CM | POA: Diagnosis not present

## 2019-06-10 DIAGNOSIS — R0600 Dyspnea, unspecified: Secondary | ICD-10-CM | POA: Diagnosis not present

## 2019-06-10 DIAGNOSIS — D638 Anemia in other chronic diseases classified elsewhere: Secondary | ICD-10-CM | POA: Diagnosis not present

## 2019-06-10 DIAGNOSIS — J439 Emphysema, unspecified: Secondary | ICD-10-CM | POA: Diagnosis present

## 2019-06-10 DIAGNOSIS — R7303 Prediabetes: Secondary | ICD-10-CM | POA: Diagnosis present

## 2019-06-10 DIAGNOSIS — I491 Atrial premature depolarization: Secondary | ICD-10-CM | POA: Diagnosis not present

## 2019-06-10 DIAGNOSIS — N179 Acute kidney failure, unspecified: Secondary | ICD-10-CM | POA: Diagnosis not present

## 2019-06-10 DIAGNOSIS — R6520 Severe sepsis without septic shock: Secondary | ICD-10-CM | POA: Diagnosis not present

## 2019-06-10 DIAGNOSIS — Z7189 Other specified counseling: Secondary | ICD-10-CM | POA: Diagnosis not present

## 2019-06-10 DIAGNOSIS — Z9861 Coronary angioplasty status: Secondary | ICD-10-CM

## 2019-06-10 LAB — BLOOD GAS, ARTERIAL
Acid-Base Excess: 4 mmol/L — ABNORMAL HIGH (ref 0.0–2.0)
Bicarbonate: 27.6 mmol/L (ref 20.0–28.0)
Drawn by: 11249
O2 Content: 6 L/min
O2 Saturation: 99.1 %
Patient temperature: 100.9
pCO2 arterial: 41.5 mmHg (ref 32.0–48.0)
pH, Arterial: 7.444 (ref 7.350–7.450)
pO2, Arterial: 172 mmHg — ABNORMAL HIGH (ref 83.0–108.0)

## 2019-06-10 LAB — CBC WITH DIFFERENTIAL/PLATELET
Abs Immature Granulocytes: 0.31 10*3/uL — ABNORMAL HIGH (ref 0.00–0.07)
Basophils Absolute: 0 10*3/uL (ref 0.0–0.1)
Basophils Relative: 0 %
Eosinophils Absolute: 0 10*3/uL (ref 0.0–0.5)
Eosinophils Relative: 0 %
HCT: 24.6 % — ABNORMAL LOW (ref 39.0–52.0)
Hemoglobin: 8 g/dL — ABNORMAL LOW (ref 13.0–17.0)
Immature Granulocytes: 2 %
Lymphocytes Relative: 1 %
Lymphs Abs: 0.2 10*3/uL — ABNORMAL LOW (ref 0.7–4.0)
MCH: 30.9 pg (ref 26.0–34.0)
MCHC: 32.5 g/dL (ref 30.0–36.0)
MCV: 95 fL (ref 80.0–100.0)
Monocytes Absolute: 0.6 10*3/uL (ref 0.1–1.0)
Monocytes Relative: 4 %
Neutro Abs: 14.8 10*3/uL — ABNORMAL HIGH (ref 1.7–7.7)
Neutrophils Relative %: 93 %
Platelets: 138 10*3/uL — ABNORMAL LOW (ref 150–400)
RBC: 2.59 MIL/uL — ABNORMAL LOW (ref 4.22–5.81)
RDW: 17.9 % — ABNORMAL HIGH (ref 11.5–15.5)
WBC Morphology: INCREASED
WBC: 16 10*3/uL — ABNORMAL HIGH (ref 4.0–10.5)
nRBC: 0 % (ref 0.0–0.2)

## 2019-06-10 LAB — BASIC METABOLIC PANEL
Anion gap: 11 (ref 5–15)
BUN: 24 mg/dL — ABNORMAL HIGH (ref 8–23)
CO2: 27 mmol/L (ref 22–32)
Calcium: 8.4 mg/dL — ABNORMAL LOW (ref 8.9–10.3)
Chloride: 93 mmol/L — ABNORMAL LOW (ref 98–111)
Creatinine, Ser: 0.94 mg/dL (ref 0.61–1.24)
GFR calc Af Amer: >60 mL/min (ref >60)
GFR calc non Af Amer: >60 mL/min (ref >60)
Glucose, Bld: 208 mg/dL — ABNORMAL HIGH (ref 70–99)
Potassium: 4.4 mmol/L (ref 3.5–5.1)
Sodium: 131 mmol/L — ABNORMAL LOW (ref 135–145)

## 2019-06-10 LAB — PREPARE RBC (CROSSMATCH)

## 2019-06-10 LAB — SARS CORONAVIRUS 2 BY RT PCR (HOSPITAL ORDER, PERFORMED IN ~~LOC~~ HOSPITAL LAB): SARS Coronavirus 2: NEGATIVE

## 2019-06-10 LAB — BRAIN NATRIURETIC PEPTIDE: B Natriuretic Peptide: 216.9 pg/mL — ABNORMAL HIGH (ref 0.0–100.0)

## 2019-06-10 LAB — LACTIC ACID, PLASMA
Lactic Acid, Venous: 3.1 mmol/L (ref 0.5–1.9)
Lactic Acid, Venous: 1.7 mmol/L (ref 0.5–1.9)

## 2019-06-10 LAB — TROPONIN I (HIGH SENSITIVITY)
Troponin I (High Sensitivity): 209 ng/L (ref <18)
Troponin I (High Sensitivity): 387 ng/L (ref <18)

## 2019-06-10 MED ORDER — LORAZEPAM 2 MG/ML IJ SOLN
0.5000 mg | Freq: Once | INTRAMUSCULAR | Status: AC
  Administered 2019-06-10: 23:00:00 0.5 mg via INTRAVENOUS
  Filled 2019-06-10: qty 1

## 2019-06-10 MED ORDER — SODIUM CHLORIDE 0.9% FLUSH
3.0000 mL | Freq: Two times a day (BID) | INTRAVENOUS | Status: DC
  Administered 2019-06-10 – 2019-06-18 (×8): 3 mL via INTRAVENOUS

## 2019-06-10 MED ORDER — BUDESONIDE 0.25 MG/2ML IN SUSP
0.2500 mg | Freq: Once | RESPIRATORY_TRACT | Status: AC
  Administered 2019-06-10: 23:00:00 0.25 mg via RESPIRATORY_TRACT
  Filled 2019-06-10: qty 2

## 2019-06-10 MED ORDER — IPRATROPIUM-ALBUTEROL 0.5-2.5 (3) MG/3ML IN SOLN
3.0000 mL | Freq: Once | RESPIRATORY_TRACT | Status: DC
  Filled 2019-06-10: qty 3

## 2019-06-10 MED ORDER — ONDANSETRON HCL 4 MG PO TABS: 4.0000 mg | ORAL_TABLET | Freq: Four times a day (QID) | ORAL | Status: DC | PRN

## 2019-06-10 MED ORDER — ONDANSETRON HCL 4 MG/2ML IJ SOLN
4.0000 mg | Freq: Four times a day (QID) | INTRAMUSCULAR | Status: DC | PRN
  Administered 2019-06-18: 18:00:00 4 mg via INTRAVENOUS
  Filled 2019-06-10: qty 2

## 2019-06-10 MED ORDER — METHYLPREDNISOLONE SODIUM SUCC 125 MG IJ SOLR
125.0000 mg | Freq: Once | INTRAMUSCULAR | Status: AC
  Administered 2019-06-10: 18:00:00 125 mg via INTRAVENOUS
  Filled 2019-06-10: qty 2

## 2019-06-10 MED ORDER — VANCOMYCIN HCL IN DEXTROSE 1-5 GM/200ML-% IV SOLN: 1000.0000 mg | Freq: Once | INTRAVENOUS | Status: DC

## 2019-06-10 MED ORDER — CHLORHEXIDINE GLUCONATE 0.12 % MT SOLN
15.0000 mL | Freq: Two times a day (BID) | OROMUCOSAL | Status: DC
  Administered 2019-06-10 – 2019-06-19 (×18): 15 mL via OROMUCOSAL
  Filled 2019-06-10 (×15): qty 15

## 2019-06-10 MED ORDER — MORPHINE SULFATE (PF) 4 MG/ML IV SOLN
4.0000 mg | Freq: Once | INTRAVENOUS | Status: AC
  Administered 2019-06-10: 4 mg via INTRAVENOUS
  Filled 2019-06-10: qty 1

## 2019-06-10 MED ORDER — BUDESONIDE 0.25 MG/2ML IN SUSP
0.2500 mg | Freq: Two times a day (BID) | RESPIRATORY_TRACT | Status: DC
  Administered 2019-06-11 – 2019-06-19 (×17): 0.25 mg via RESPIRATORY_TRACT
  Filled 2019-06-10 (×17): qty 2

## 2019-06-10 MED ORDER — IPRATROPIUM-ALBUTEROL 0.5-2.5 (3) MG/3ML IN SOLN: 3.0000 mL | Freq: Four times a day (QID) | RESPIRATORY_TRACT | Status: DC | PRN

## 2019-06-10 MED ORDER — ALPRAZOLAM 0.5 MG PO TABS
0.5000 mg | ORAL_TABLET | Freq: Every day | ORAL | Status: DC
  Administered 2019-06-11 – 2019-06-16 (×6): 0.5 mg via ORAL
  Filled 2019-06-10 (×6): qty 1

## 2019-06-10 MED ORDER — ACETAMINOPHEN 325 MG PO TABS
650.0000 mg | ORAL_TABLET | Freq: Four times a day (QID) | ORAL | Status: DC | PRN
  Administered 2019-06-14 – 2019-06-17 (×7): 650 mg via ORAL
  Filled 2019-06-10 (×7): qty 2

## 2019-06-10 MED ORDER — ALBUTEROL SULFATE HFA 108 (90 BASE) MCG/ACT IN AERS
4.0000 | INHALATION_SPRAY | Freq: Once | RESPIRATORY_TRACT | Status: AC
  Administered 2019-06-10: 4 via RESPIRATORY_TRACT
  Filled 2019-06-10: qty 6.7

## 2019-06-10 MED ORDER — VITAMIN B-12 1000 MCG PO TABS
1000.0000 ug | ORAL_TABLET | Freq: Every day | ORAL | Status: DC
  Administered 2019-06-11 – 2019-06-19 (×9): 1000 ug via ORAL
  Filled 2019-06-10 (×9): qty 1

## 2019-06-10 MED ORDER — METRONIDAZOLE IN NACL 5-0.79 MG/ML-% IV SOLN
500.0000 mg | Freq: Once | INTRAVENOUS | Status: AC
  Administered 2019-06-10: 21:00:00 500 mg via INTRAVENOUS
  Filled 2019-06-10: qty 100

## 2019-06-10 MED ORDER — SODIUM CHLORIDE 0.9% IV SOLUTION: Freq: Once | INTRAVENOUS | Status: DC

## 2019-06-10 MED ORDER — SODIUM CHLORIDE 0.9 % IV SOLN
2.0000 g | Freq: Once | INTRAVENOUS | Status: AC
  Administered 2019-06-10: 20:00:00 2 g via INTRAVENOUS
  Filled 2019-06-10: qty 2

## 2019-06-10 MED ORDER — ACETAMINOPHEN 500 MG PO TABS
1000.0000 mg | ORAL_TABLET | Freq: Once | ORAL | Status: AC
  Administered 2019-06-10: 18:00:00 1000 mg via ORAL
  Filled 2019-06-10: qty 2

## 2019-06-10 MED ORDER — METOPROLOL TARTRATE 25 MG PO TABS
25.0000 mg | ORAL_TABLET | Freq: Two times a day (BID) | ORAL | Status: DC
  Administered 2019-06-11 – 2019-06-16 (×12): 25 mg via ORAL
  Filled 2019-06-10 (×12): qty 1

## 2019-06-10 MED ORDER — AEROCHAMBER PLUS FLO-VU MEDIUM MISC
1.0000 | Freq: Once | Status: AC
  Administered 2019-06-10: 18:00:00 1
  Filled 2019-06-10: qty 1

## 2019-06-10 MED ORDER — IPRATROPIUM-ALBUTEROL 0.5-2.5 (3) MG/3ML IN SOLN
3.0000 mL | RESPIRATORY_TRACT | Status: DC
  Administered 2019-06-10 – 2019-06-19 (×52): 3 mL via RESPIRATORY_TRACT
  Filled 2019-06-10 (×51): qty 3

## 2019-06-10 MED ORDER — ONDANSETRON HCL 4 MG/2ML IJ SOLN: 4.0000 mg | Freq: Once | INTRAMUSCULAR | Status: DC

## 2019-06-10 MED ORDER — TRAMADOL HCL 50 MG PO TABS
50.0000 mg | ORAL_TABLET | Freq: Four times a day (QID) | ORAL | Status: DC | PRN
  Administered 2019-06-11 – 2019-06-19 (×10): 50 mg via ORAL
  Filled 2019-06-10 (×10): qty 1

## 2019-06-10 MED ORDER — IPRATROPIUM-ALBUTEROL 0.5-2.5 (3) MG/3ML IN SOLN: 3.0000 mL | Freq: Four times a day (QID) | RESPIRATORY_TRACT | Status: DC

## 2019-06-10 MED ORDER — IOHEXOL 350 MG/ML SOLN
100.0000 mL | Freq: Once | INTRAVENOUS | Status: AC | PRN
  Administered 2019-06-10: 21:00:00 100 mL via INTRAVENOUS

## 2019-06-10 MED ORDER — FUROSEMIDE 40 MG PO TABS: 40.0000 mg | ORAL_TABLET | Freq: Every day | ORAL | Status: DC

## 2019-06-10 MED ORDER — PANTOPRAZOLE SODIUM 40 MG PO TBEC
40.0000 mg | DELAYED_RELEASE_TABLET | Freq: Every day | ORAL | Status: DC
  Administered 2019-06-11 – 2019-06-19 (×9): 40 mg via ORAL
  Filled 2019-06-10 (×9): qty 1

## 2019-06-10 MED ORDER — SODIUM CHLORIDE 0.9 % IV BOLUS
1000.0000 mL | Freq: Once | INTRAVENOUS | Status: AC
  Administered 2019-06-10: 1000 mL via INTRAVENOUS

## 2019-06-10 MED ORDER — SODIUM CHLORIDE (PF) 0.9 % IJ SOLN
INTRAMUSCULAR | Status: AC
  Filled 2019-06-10: qty 50

## 2019-06-10 MED ORDER — IOHEXOL 300 MG/ML  SOLN: 100.0000 mL | Freq: Once | INTRAMUSCULAR | Status: DC | PRN

## 2019-06-10 MED ORDER — ALBUTEROL SULFATE (2.5 MG/3ML) 0.083% IN NEBU: 3.0000 mL | INHALATION_SOLUTION | Freq: Four times a day (QID) | RESPIRATORY_TRACT | Status: DC | PRN

## 2019-06-10 MED ORDER — ORAL CARE MOUTH RINSE
15.0000 mL | Freq: Two times a day (BID) | OROMUCOSAL | Status: DC
  Administered 2019-06-11 – 2019-06-18 (×14): 15 mL via OROMUCOSAL

## 2019-06-10 MED ORDER — ACETAMINOPHEN 650 MG RE SUPP: 650.0000 mg | Freq: Four times a day (QID) | RECTAL | Status: DC | PRN

## 2019-06-10 MED ORDER — SIMVASTATIN 10 MG PO TABS
20.0000 mg | ORAL_TABLET | Freq: Every day | ORAL | Status: DC
  Administered 2019-06-11 – 2019-06-18 (×8): 20 mg via ORAL
  Filled 2019-06-10 (×8): qty 2

## 2019-06-10 MED ORDER — CHLORHEXIDINE GLUCONATE CLOTH 2 % EX PADS
6.0000 | MEDICATED_PAD | Freq: Every day | CUTANEOUS | Status: DC
  Administered 2019-06-11 – 2019-06-18 (×8): 6 via TOPICAL

## 2019-06-10 NOTE — Progress Notes (Signed)
Non-rebreather removed from pt upon EMS arrival. Pt placed on 6L [place] with SATS of 98%. Will continue to monitor.

## 2019-06-10 NOTE — ED Notes (Signed)
Pt requests for one of the rails to be put down because it makes him feel claustrophobic. Informed pt of risks of falls and to push call bell before getting up out of bed.

## 2019-06-10 NOTE — ED Notes (Signed)
Cefepime paused for pt the be transported to CT.

## 2019-06-10 NOTE — ED Notes (Signed)
Pt was advised that a urine sample was needed and was provided a urinal. Pt refused to urinate while lying on his back. He than became upset that both side railings were up. Pt was made aware that we did not want him getting up due to being on biPAP.

## 2019-06-10 NOTE — H&P (Signed)
History and Physical    [name] WUX:[id number] DOB: 22-Jun-1946 DOA: 06/10/2019  PCP: [name], MD  Patient coming from: Home via EMS  I have personally briefly reviewed patient's old medical records in [place]  Chief Complaint: Shortness of breath  HPI: [name] is a 73 y.o. male with medical history significant for metastatic bladder cancer, COPD with chronic respiratory failure on 6 L supplemental O2 via [place], CAD s/p PCI with DES, chronic diastolic CHF, hypertension, hyperlipidemia, anemia of chronic disease, anxiety, and OSA on BiPAP who presents to the ED for evaluation of dyspnea.  Patient was recently admitted from 05/26/2019-06/02/2019 at which time he was treated for hypoxic respiratory failure secondary to COPD exacerbation.  He was also found to have sepsis due to E. coli bacteremia felt secondary to necrotic bladder tumor.  Patient was feeling well until today when he developed new onset shortness of breath when he got up to go outside of his house.  He had noticed some wheezing as well.  He states that he felt his lungs were getting tight and he denies using home nebulizer treatment and time therefore called EMS.  He denies any chest pain, palpitations, dysuria.  He has abdominal pain from his bladder tumor.  He reports swelling in both of his legs which he feels is stable.  Per ED documentation patient was on his home 6 L O2 via  with O2 saturation in the 80s on EMS arrival.  He was placed on BiPAP with relief.  When taken off BiPAP he again became very short of breath.  He was also noted to have a temperature 102 Fahrenheit.  He was subsequently brought to the ED for further evaluation.  ED Course:  Initial vitals showed BP 109/65, pulse 126, RR 18, temp 100.9 Fahrenheit, SPO2 99% on BiPAP.  Labs notable for WBC 16.0, hemoglobin 8.0, platelets 138,000, Sodium 131, potassium 4.4, bicarb 27, BUN 24, creatinine 0.94, serum glucose 208, lactic acid 3.1.   High-sensitivity troponin 209 >> 387.  BNP 216.9.  Blood cultures were obtained and pending.  SARS-CoV-2 test was negative.  Portable chest x-ray showed emphysematous changes and bilateral pulmonary metastases without acute abnormality.  CTA chest PE study was negative for evidence of pulmonary embolism.  Bilateral pulmonary metastases were seen felt grossly unchanged from prior comparison.  Large right abdominal mass was incompletely visualized and hepatic metastases was poorly evaluated.  Patient was given IV Solu-Medrol 125 mg, IV vancomycin/cefepime/Flagyl, 1 L normal saline, and nebulizer treatments.  Repeat lactic acid was 1.7.  EDP discussed the case with on-call cardiology who felt patient was not a cardiac catheterization candidate or candidate for anticoagulation due to anemia and mild thrombocytopenia.  Hospitalist service was consulted admit for further evaluation and management.   Review of Systems: All systems reviewed and are negative except as documented in history of present illness above.   Past Medical History:  Diagnosis Date  . Anxiety   . Bladder cancer ([place]) 2011  . Borderline diabetes mellitus   . CAD (coronary artery disease)   . Cigarette smoker   . COPD (chronic obstructive pulmonary disease) ([place])   . DJD (degenerative joint disease)   . Emphysema of lung ([place])   . Epistaxis   . History of colonic polyps 2004   hyperplastic   . History of sinusitis   . Hypercholesteremia   . Hypertension   . Obesity     Past Surgical History:  Procedure Laterality Date  .  cataract surgery  septemeber 2013  . cataract surgery/sub posterior vitrectomy in left eye  1996   Dr. Zadie Rhine  . [facility]  [phone]    Social History:  reports that he quit smoking about 3 years ago. His smoking use included cigarettes. He has a 90.00 pack-year smoking history. He has never used smokeless tobacco. He reports current alcohol use. He reports that he does not use drugs.   Allergies  Allergen Reactions  . Amlodipine Swelling    Family History  Problem Relation Age of Onset  . Heart disease Mother   . Heart disease Brother   . Cancer Brother   . Aneurysm Brother      Prior to Admission medications   Medication Sig Start Date End Date Taking? Authorizing Provider  Albuterol Sulfate (PROAIR RESPICLICK) 220 (90 Base) MCG/ACT AEPB Inhale 2 puffs into the lungs every 6 (six) hours as needed (shortness of breath). 10/26/18   Noralee Space, MD  ALPRAZolam Duanne Moron) 0.5 MG tablet Take 1 tablet (0.5 mg total) by mouth at bedtime. 10/26/18   Noralee Space, MD  Ascorbic Acid (VITAMIN C) 1000 MG tablet Take 1,000 mg by mouth daily.    [provider]  budesonide (PULMICORT) 0.25 MG/2ML nebulizer solution Take 2 mLs (0.25 mg total) by nebulization 2 (two) times daily. 02/12/19   Garner Nash, DO  cholecalciferol (VITAMIN D) 400 units TABS tablet Take 400 Units by mouth at bedtime.    [provider]  Flaxseed, Linseed, (FLAX SEEDS PO) Take 1 capsule by mouth daily.    [provider]  fluticasone (FLONASE) 50 MCG/ACT nasal spray Place 2 sprays into both nostrils daily as needed for allergies or rhinitis.  09/21/18   [provider]  furosemide (LASIX) 40 MG tablet Take 40 mg by mouth daily as needed for fluid.     [provider]  guaiFENesin (MUCINEX) 600 MG 12 hr tablet Take 1,200 mg by mouth 2 (two) times daily.    [provider]  ipratropium-albuterol (DUONEB) 0.5-2.5 (3) MG/3ML SOLN Take 3 mLs by nebulization every 4 (four) hours as needed. Patient taking differently: Take 3 mLs by nebulization See admin instructions. Take every 4 hours. Take additional doses every 4 hours as needed for shortness of breath. 02/12/19   Icard, Octavio Graves, DO  methocarbamol (ROBAXIN) 500 MG tablet Take 1,000 mg by mouth at bedtime.  03/07/18   [provider]  metoprolol tartrate (LOPRESSOR) 25 MG tablet Take 25 mg by  mouth 2 (two) times a day. 05/23/19   [provider]  multivitamin-lutein (OCUVITE-LUTEIN) CAPS capsule Take 1 capsule by mouth 2 (two) times a day.     [provider]  nitroGLYCERIN (NITROSTAT) 0.4 MG SL tablet Place 1 tablet (0.4 mg total) under the tongue every 5 (five) minutes as needed for chest pain. 11/19/15   Richardson Dopp T, PA-C  ondansetron (ZOFRAN-ODT) 8 MG disintegrating tablet Take 8 mg by mouth every 8 (eight) hours as needed for nausea/vomiting. 04/02/19   [provider]  pantoprazole (PROTONIX) 40 MG tablet Take 40 mg by mouth daily. 03/03/19   [provider]  Polyethyl Glycol-Propyl Glycol (SYSTANE OP) Apply 1 drop to eye every 6 (six) hours as needed (dry eyes).     [provider]  predniSONE (DELTASONE) 10 MG tablet Taper dose: 60mg  po daily x 3 days, then 40mg  po daily x 3 days, then 20mg  po daily x 3 days, then 10mg  po daily x 3  days, then 5mg  po daily x 2 days, then stop, zero refills. 06/02/19   Donne Hazel, MD  simvastatin (ZOCOR) 20 MG tablet Take 1 tablet (20 mg total) by mouth daily at 6 PM. 11/19/15   Kathlen Mody, Nicki Reaper T, PA-C  sodium chloride (OCEAN) 0.65 % SOLN nasal spray Place 1 spray into both nostrils as needed for congestion. Patient taking differently: Place 1 spray into both nostrils daily as needed for congestion.  10/29/15   Cherene Altes, MD  traMADol (ULTRAM) 50 MG tablet Take 1 tablet (50 mg total) by mouth every 6 (six) hours as needed for moderate pain. 03/30/19   Eugenie Filler, MD  vitamin B-12 (CYANOCOBALAMIN) 1000 MCG tablet Take 1,000 mcg by mouth daily.    [provider]    Physical Exam: Vitals:   06/10/19 2300 06/10/19 2301 06/10/19 2308 06/11/19 0000  BP: 129/81   109/62  Pulse: 79  72 76  Resp: 13  15 17   Temp: (!) 97.5 F (36.4 C)     TempSrc: Oral     SpO2: 100% 100% 100% 98%  Weight:      Height:        Constitutional: Obese man resting supine in bed while on BiPAP, NAD,  calm, comfortable Eyes: PERRL, lids and conjunctivae normal ENMT: Mucous membranes are moist. Posterior pharynx clear of any exudate or lesions.Normal dentition.  Neck: normal, supple, no masses. Respiratory: clear to auscultation bilaterally, no wheezing, no crackles. Normal respiratory effort. No accessory muscle use.  Cardiovascular: Regular rate and rhythm, no murmurs / rubs / gallops.  +2 pitting edema bilateral lower extremities.  2+ pedal pulses. Abdomen: Palpable bladder tumor up to the umbilicus, tender to palpation at site of mass. Bowel sounds positive.  Musculoskeletal: no clubbing / cyanosis. No joint deformity upper and lower extremities. Good ROM, no contractures. Normal muscle tone.  Skin: no rashes, lesions, ulcers. No induration Neurologic: CN 2-12 grossly intact. Sensation intact, Strength 5/5 in all 4.  Psychiatric: Normal judgment and insight. Alert and oriented x 3. Normal mood.     Labs on Admission: I have personally reviewed following labs and imaging studies  CBC: Recent Labs  Lab 06/10/19 1904  WBC 16.0*  NEUTROABS 14.8*  HGB 8.0*  HCT 24.6*  MCV 95.0  PLT 132*   Basic Metabolic Panel: Recent Labs  Lab 06/10/19 1727  NA 131*  K 4.4  CL 93*  CO2 27  GLUCOSE 208*  BUN 24*  CREATININE 0.94  CALCIUM 8.4*   GFR: Estimated Creatinine Clearance: 71 mL/min (by C-G formula based on SCr of 0.94 mg/dL). Liver Function Tests: No results for input(s): AST, ALT, ALKPHOS, BILITOT, PROT, ALBUMIN in the last 168 hours. No results for input(s): LIPASE, AMYLASE in the last 168 hours. No results for input(s): AMMONIA in the last 168 hours. Coagulation Profile: No results for input(s): INR, PROTIME in the last 168 hours. Cardiac Enzymes: No results for input(s): CKTOTAL, CKMB, CKMBINDEX, TROPONINI in the last 168 hours. BNP (last 3 results) No results for input(s): PROBNP in the last 8760 hours. HbA1C: No results for input(s): HGBA1C in the last 72 hours.  CBG: No results for input(s): GLUCAP in the last 168 hours. Lipid Profile: No results for input(s): CHOL, HDL, LDLCALC, TRIG, CHOLHDL, LDLDIRECT in the last 72 hours. Thyroid Function Tests: No results for input(s): TSH, T4TOTAL, FREET4, T3FREE, THYROIDAB in the last 72 hours. Anemia Panel: No results for input(s): VITAMINB12, FOLATE, FERRITIN, TIBC, IRON, RETICCTPCT in  the last 72 hours. Urine analysis:    Component Value Date/Time   COLORURINE YELLOW 06/11/2019 0000   APPEARANCEUR CLEAR 06/11/2019 0000   LABSPEC >1.046 (H) 06/11/2019 0000   PHURINE 5.0 06/11/2019 0000   GLUCOSEU NEGATIVE 06/11/2019 0000   GLUCOSEU NEGATIVE 06/30/2010 1050   HGBUR NEGATIVE 06/11/2019 0000   BILIRUBINUR NEGATIVE 06/11/2019 0000   KETONESUR 5 (A) 06/11/2019 0000   PROTEINUR NEGATIVE 06/11/2019 0000   UROBILINOGEN 0.2 06/30/2010 1050   NITRITE NEGATIVE 06/11/2019 0000   LEUKOCYTESUR NEGATIVE 06/11/2019 0000    Radiological Exams on Admission: Ct Angio Chest Pe W And/or Wo Contrast  Result Date: 06/10/2019 CLINICAL DATA:  Shortness of breath, fever. 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 EXAM: CT ANGIOGRAPHY CHEST WITH CONTRAST TECHNIQUE: Multidetector CT imaging of the chest was performed using the standard protocol during bolus administration of intravenous contrast. Multiplanar CT image reconstructions and MIPs were obtained to evaluate the vascular anatomy. CONTRAST:  187mL OMNIPAQUE IOHEXOL 350 MG/ML SOLN COMPARISON:  Chest radiograph dated 06/10/2019. CTA chest dated 05/27/2019. FINDINGS: Motion degraded images. Cardiovascular: Satisfactory opacification of the bilateral pulmonary arteries to the segmental level. No evidence pulmonary embolism. No evidence of thoracic aortic aneurysm or dissection. Atherosclerotic calcifications of the aortic arch. The heart is normal in size.  No pericardial effusion. 3 vessel coronary atherosclerosis. Mediastinum/Nodes: No  suspicious mediastinal lymphadenopathy. Suspected 18 mm right isthmic thyroid nodule (series 4/image 11), not well visualized on the prior study, although of questionable clinical significance in this patient. Lungs/Pleura: Multiple bilateral pulmonary metastases, unchanged from recent prior, although better evaluated on the current study due to reduced motion degradation. Index lesions are as follows: --21 mm right upper lobe nodule (series 6/image 39), previously measured as 19 mm --24 mm superior segment left lower lobe nodule (series 6/image 55), previously measured as 24 mm --27 mm central right lower lobe nodule (series 6/image 109), previously measured as 35 mm Extensive centrilobular and paraseptal emphysematous changes, upper lobe predominant. No focal consolidation. No pleural effusion or pneumothorax. Upper Abdomen: Embolization coils within the left hepatic lobe with adjacent incompletely visualized treated left hepatic lobe lesion (series 4/image 88). Additional poorly evaluated lesion in the central hepatic dome (series 4/image 78). Cholelithiasis. 6.5 cm right renal cyst. The superior aspect of the known large right abdominal mass is incompletely visualized (series 4/image 111). Musculoskeletal: Degenerative changes of the visualized thoracolumbar spine. Review of the MIP images confirms the above findings. IMPRESSION: No evidence of pulmonary embolism. Bilateral pulmonary metastases, better evaluate than on the recent prior, likely grossly unchanged. Large right abdominal mass is incompletely visualized. Hepatic metastases, poorly evaluated. Additional ancillary findings as above. Aortic Atherosclerosis (ICD10-I70.0) and Emphysema (ICD10-J43.9). Electronically Signed   By: Julian Hy M.D.   On: 06/10/2019 21:10   Dg Chest Port 1 View  Result Date: 06/10/2019 CLINICAL DATA:  Shortness of breath, fever, history bladder cancer, emphysema, COPD, former smoker, coronary artery disease,  hypertension EXAM: PORTABLE CHEST 1 VIEW COMPARISON:  Portable exam 1723 hours compared to 05/26/2019 and correlated with CT chest of 05/27/2019 FINDINGS: Normal heart size, mediastinal contours, and pulmonary vascularity. Atherosclerotic calcification aorta. Emphysematous and minimal bronchitic changes consistent with COPD. BILATERAL pulmonary nodules consistent with metastases. Bibasilar atelectasis versus fibrosis unchanged. No acute infiltrate, pleural effusion, or pneumothorax. No acute osseous findings. IMPRESSION: Changes of COPD with bibasilar chronic interstitial lung disease/fibrosis versus atelectasis. BILATERAL pulmonary metastases. No acute abnormalities. Electronically Signed   By: Crist Infante.D.  On: 06/10/2019 18:15    EKG: Independently reviewed. Sinus tachycardia with motion artifact.  Rate is faster when compared to prior.  Assessment/Plan Principal Problem:   Acute on chronic respiratory failure (HCC) Active Problems:   Malignant neoplasm of bladder (HCC)   Anxiety   Essential hypertension   End stage COPD (HCC)   OSA treated with BiPAP   Chronic diastolic CHF (congestive heart failure) ([place])   Anemia of chronic disease   Sepsis (HCC)  [name] is a 73 y.o. male with medical history significant for metastatic bladder cancer, COPD with chronic respiratory failure on 6 L supplemental O2 via [place], CAD s/p PCI with DES, chronic diastolic CHF, hypertension, hyperlipidemia, anemia of chronic disease, anxiety, and OSA on BiPAP who is admitted for acute on chronic respiratory failure.   Acute on chronic respiratory failure due to exacerbation of COPD: Symptoms appear primarily secondary to exacerbation of COPD with likely contribution from CHF and known pulmonary metastases.  Respiratory status significantly improved with BiPAP. -Admit to PCU, continue BiPAP as needed and nightly -Continue Pulmicort, duo nebs, and as needed albuterol -Wean to home supplemental O2 via [place]  as able  Sepsis: Patient with recent E. coli bacteremia now presenting with fever, leukocytosis, tachypnea, tachycardia.  Urinalysis negative, however obtained after patient received antibiotics. -Continue IV vancomycin and cefepime for now, de-escalate as able -Follow blood cultures  Elevated troponin in setting of CAD s/p PCI w/ DES in 2008: Suspect type II NSTEMI from respiratory failure and sepsis as above.  Per EDP discussion with cardiology he is not an interventional candidate. -Repeat troponin I -Continue simvastatin and Lopressor -Not on aspirin due to previous concern for GI bleeding  Acute on chronic diastolic CHF: Patient hypervolemic on admission.  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  Status post radiation therapy, neurosurgery and chemotherapy per patient.  Follows with palliative care.   Anemia of chronic disease: Hemoglobin 8.0 on admission.  Recently required transfusion due to low hemoglobin of 6.7 on 05/28/2019. -Repeat labs in a.m., consider transfusion if hemoglobin declines  Hypertension: BP borderline low.  Resume metoprolol tomorrow as BP allows.  Hyperlipidemia: Continue simvastatin.  Anxiety: Continue home Xanax as needed.  OSA: Continue BiPAP nightly.   DVT prophylaxis: SCDs  Code Status: DNR/DNI, confirmed with patient Family Communication: None present on admission Disposition Plan: Pending clinical progress Consults called: None Admission status: Admit - It is my clinical opinion that admission to INPATIENT is reasonable and necessary because of the expectation that this patient will require hospital care that crosses at least 2 midnights to treat this condition based on the medical complexity of the problems presented.  Given the aforementioned  information, the predictability of an adverse outcome is felt to be significant.      Zada Finders MD [facility]  If 7PM-7AM, please contact night-coverage [email or web address]  06/11/2019, 1:42 AM

## 2019-06-10 NOTE — ED Notes (Signed)
Date and time results received: 06/10/19 8:44 PM  (use smartphrase ".now" to insert current time)  Test: Troponin Critical Value: 387  Name of Provider Notified:Dr.Haviland  Orders Received? Or Actions Taken?: none

## 2019-06-10 NOTE — ED Triage Notes (Signed)
Pt presents to ED via [facility] coming from home. EMS reports that pt called out for [facility], upon their arrival pt SpO2 was in the 80s and did not come up with NRB. EMS initiated CPAP at 7.5 and pt ShOB improved. Pt was going to refuse EMS transport because he felt better put upon standing to walk to kitchen pt became extremely short of breath again and EMS reports pt had a fever of 102 orally. Pt was able to maintain at 100% on EMS O2 at 10 LPM. EMS also reports that pt is a DNR but they don't have his form with them.

## 2019-06-10 NOTE — ED Notes (Signed)
Respiratory contacted for ABG and BiPap

## 2019-06-10 NOTE — ED Provider Notes (Signed)
[facility] DEPT Provider Note   CSN: [id number] Arrival date & time: 06/10/19  1711    History   Chief Complaint Chief Complaint  Patient presents with   Shortness of Breath    HPI [name] is a 73 y.o. male.     Pt presents to the ED today with sob.  The pt has a hx of end-stage COPD and metastatic bladder cancer.  The pt was admitted from 7/11-18 for sepsis likely from necrotic tumor.  He also had an acute on chronic copd exacerbation and chf. Pt is on 6L oxygen chronically.  He developed severe sob pta.  He called EMS.  When they arrived, his O2 sat on 6L was in the 80s.  They put him on bipap which helped.  He did not want to come in, so he was taken off the bipap and became very sob.  EMS also reports he had a fever of 102 today.  Pt has had a cough, but that is normal for him.     Past Medical History:  Diagnosis Date   Anxiety    Bladder cancer ([place]) 2011   Borderline diabetes mellitus    CAD (coronary artery disease)    Cigarette smoker    COPD (chronic obstructive pulmonary disease) (HCC)    DJD (degenerative joint disease)    Emphysema of lung ([place])    Epistaxis    History of colonic polyps 2004   hyperplastic    History of sinusitis    Hypercholesteremia    Hypertension    Obesity     Patient Active Problem List   Diagnosis Date Noted   Acute on chronic respiratory failure ([place]) 06/10/2019   Sepsis ([place]) 05/27/2019   COPD exacerbation ([place]) 05/27/2019   Shortness of breath    Symptomatic anemia 05/06/2019   Anemia of chronic disease    OSA (obstructive sleep apnea)    SBO (small bowel obstruction) (HCC) 03/27/2019   Nausea vomiting and diarrhea    Weakness    SOB (shortness of breath)    Elevated troponin    Palliative care by specialist    Goals of care, counseling/discussion    Abdominal pain    GI bleed 02/18/2019   DNR (do not resuscitate) 12/21/2018   Anemia 12/14/2018    Liver metastasis ([place]) 07/13/2018   Acute blood loss anemia 06/07/2018   Rectal bleeding 05/13/2018   Chronic diastolic CHF (congestive heart failure) ([place]) 05/13/2018   Lower GI bleed 05/13/2018   Class 2 obesity with body mass index (BMI) of 37.0 to 37.9 in adult 01/10/2018   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   Acute on chronic diastolic CHF (congestive heart failure) ([place]) 01/19/2017   Chronic respiratory failure with hypoxia and hypercapnia ([place]) 10/13/2016   OSA treated with BiPAP 10/13/2016   Macrocytic anemia 09/24/2016   COPD with acute exacerbation ([place]) 09/24/2016   Acute bronchitis 02/03/2016   Ex-smoker 10/31/2015   Dilated cardiomyopathy ([place])    Pulmonary hypertension ([place])    Acute on chronic respiratory failure with hypoxia and hypercapnia ([place]) 10/20/2015   Obstruction to urinary outflow 03/24/2011   LBP (low back pain) 03/24/2011   Malignant neoplasm of bladder ([place]) 09/22/2010   HEMATURIA UNSPECIFIED 05/14/2010   Osteoarthritis 03/28/2010   Coronary atherosclerosis of native coronary artery 12/19/2007   Acute sinusitis 12/19/2007   End stage COPD ([place]) 12/19/2007   COLONIC POLYPS 12/18/2007   HYPERCHOLESTEROLEMIA 12/18/2007   Anxiety 12/18/2007   Essential hypertension  12/18/2007   GASTRITIS 12/18/2007   Impaired fasting glucose 12/18/2007    Past Surgical History:  Procedure Laterality Date   cataract surgery  septemeber 2013   cataract surgery/sub posterior vitrectomy in left eye  1996   Dr. Zadie Rhine   [facility]  [phone]        Home Medications    Prior to Admission medications   Medication Sig Start Date End Date Taking? Authorizing Provider  Albuterol Sulfate (PROAIR RESPICLICK) 376 (90 Base) MCG/ACT AEPB Inhale 2 puffs into the lungs every 6 (six) hours as needed (shortness of breath). 10/26/18   Noralee Space, MD  ALPRAZolam Duanne Moron) 0.5 MG tablet Take 1 tablet (0.5 mg total) by mouth at bedtime.  10/26/18   Noralee Space, MD  Ascorbic Acid (VITAMIN C) 1000 MG tablet Take 1,000 mg by mouth daily.    [provider]  budesonide (PULMICORT) 0.25 MG/2ML nebulizer solution Take 2 mLs (0.25 mg total) by nebulization 2 (two) times daily. 02/12/19   Garner Nash, DO  cholecalciferol (VITAMIN D) 400 units TABS tablet Take 400 Units by mouth at bedtime.    [provider]  Flaxseed, Linseed, (FLAX SEEDS PO) Take 1 capsule by mouth daily.    [provider]  fluticasone (FLONASE) 50 MCG/ACT nasal spray Place 2 sprays into both nostrils daily as needed for allergies or rhinitis.  09/21/18   [provider]  furosemide (LASIX) 40 MG tablet Take 40 mg by mouth daily as needed for fluid.     [provider]  guaiFENesin (MUCINEX) 600 MG 12 hr tablet Take 1,200 mg by mouth 2 (two) times daily.    [provider]  ipratropium-albuterol (DUONEB) 0.5-2.5 (3) MG/3ML SOLN Take 3 mLs by nebulization every 4 (four) hours as needed. Patient taking differently: Take 3 mLs by nebulization See admin instructions. Take every 4 hours. Take additional doses every 4 hours as needed for shortness of breath. 02/12/19   Icard, Octavio Graves, DO  methocarbamol (ROBAXIN) 500 MG tablet Take 1,000 mg by mouth at bedtime.  03/07/18   [provider]  metoprolol tartrate (LOPRESSOR) 25 MG tablet Take 25 mg by mouth 2 (two) times a day. 05/23/19   [provider]  multivitamin-lutein (OCUVITE-LUTEIN) CAPS capsule Take 1 capsule by mouth 2 (two) times a day.     [provider]  nitroGLYCERIN (NITROSTAT) 0.4 MG SL tablet Place 1 tablet (0.4 mg total) under the tongue every 5 (five) minutes as needed for chest pain. 11/19/15   Richardson Dopp T, PA-C  ondansetron (ZOFRAN-ODT) 8 MG disintegrating tablet Take 8 mg by mouth every 8 (eight) hours as needed for nausea/vomiting. 04/02/19   [provider]  pantoprazole (PROTONIX) 40 MG tablet Take 40 mg by mouth  daily. 03/03/19   [provider]  Polyethyl Glycol-Propyl Glycol (SYSTANE OP) Apply 1 drop to eye every 6 (six) hours as needed (dry eyes).     [provider]  predniSONE (DELTASONE) 10 MG tablet Taper dose: 60mg  po daily x 3 days, then 40mg  po daily x 3 days, then 20mg  po daily x 3 days, then 10mg  po daily x 3 days, then 5mg  po daily x 2 days, then stop, zero refills. 06/02/19   Donne Hazel, MD  simvastatin (ZOCOR) 20 MG tablet Take 1 tablet (20 mg total) by mouth daily at 6 PM. 11/19/15   Kathlen Mody, Nicki Reaper T, PA-C  sodium chloride (OCEAN) 0.65 % SOLN nasal spray Place 1 spray into  both nostrils as needed for congestion. Patient taking differently: Place 1 spray into both nostrils daily as needed for congestion.  10/29/15   Cherene Altes, MD  traMADol (ULTRAM) 50 MG tablet Take 1 tablet (50 mg total) by mouth every 6 (six) hours as needed for moderate pain. 03/30/19   Eugenie Filler, MD  vitamin B-12 (CYANOCOBALAMIN) 1000 MCG tablet Take 1,000 mcg by mouth daily.    [provider]    Family History Family History  Problem Relation Age of Onset   Heart disease Mother    Heart disease Brother    Cancer Brother    Aneurysm Brother     Social History Social History   Tobacco Use   Smoking status: Former Smoker    Packs/day: 2.00    Years: 45.00    Pack years: 90.00    Types: Cigarettes    Quit date: 10/17/2015    Years since quitting: 3.6   Smokeless tobacco: Never Used  Substance Use Topics   Alcohol use: Yes    Alcohol/week: 0.0 standard drinks    Comment: glass of wine at night   Drug use: No     Allergies   Amlodipine   Review of Systems Review of Systems  Constitutional: Positive for fever.  Respiratory: Positive for cough and shortness of breath.   All other systems reviewed and are negative.    Physical Exam Updated Vital Signs BP 104/71    Pulse (!) 102    Temp (!) 100.9 F (38.3 C) (Rectal)    Resp 14    Ht 5\' 6"   (1.676 m)    Wt 86.2 kg    SpO2 100%    BMI 30.67 kg/m   Physical Exam Vitals signs and nursing note reviewed.  Constitutional:      General: He is in acute distress.     Appearance: He is well-developed. He is ill-appearing.  HENT:     Head: Normocephalic and atraumatic.     Mouth/Throat:     Mouth: Mucous membranes are moist.     Pharynx: Oropharynx is clear.  Eyes:     Extraocular Movements: Extraocular movements intact.     Pupils: Pupils are equal, round, and reactive to light.  Neck:     Musculoskeletal: Normal range of motion and neck supple.  Cardiovascular:     Rate and Rhythm: Regular rhythm. Tachycardia present.  Pulmonary:     Effort: Tachypnea present.     Breath sounds: Wheezing present.  Abdominal:     General: Bowel sounds are normal.     Palpations: Abdomen is soft. There is mass.  Musculoskeletal: Normal range of motion.  Skin:    General: Skin is warm.     Capillary Refill: Capillary refill takes less than 2 seconds.  Neurological:     General: No focal deficit present.     Mental Status: He is alert and oriented to person, place, and time.  Psychiatric:        Mood and Affect: Mood normal.        Behavior: Behavior normal.      ED Treatments / Results  Labs (all labs ordered are listed, but only abnormal results are displayed) Labs Reviewed  BASIC METABOLIC PANEL - Abnormal; Notable for the following components:      Result Value   Sodium 131 (*)    Chloride 93 (*)    Glucose, Bld 208 (*)    BUN 24 (*)    Calcium  8.4 (*)    All other components within normal limits  BRAIN NATRIURETIC PEPTIDE - Abnormal; Notable for the following components:   B Natriuretic Peptide 216.9 (*)    All other components within normal limits  LACTIC ACID, PLASMA - Abnormal; Notable for the following components:   Lactic Acid, Venous 3.1 (*)    All other components within normal limits  CBC WITH DIFFERENTIAL/PLATELET - Abnormal; Notable for the following  components:   WBC 16.0 (*)    RBC 2.59 (*)    Hemoglobin 8.0 (*)    HCT 24.6 (*)    RDW 17.9 (*)    Platelets 138 (*)    Neutro Abs 14.8 (*)    Lymphs Abs 0.2 (*)    Abs Immature Granulocytes 0.31 (*)    All other components within normal limits  BLOOD GAS, ARTERIAL - Abnormal; Notable for the following components:   pO2, Arterial 172 (*)    Acid-Base Excess 4.0 (*)    All other components within normal limits  TROPONIN I (HIGH SENSITIVITY) - Abnormal; Notable for the following components:   Troponin I (High Sensitivity) 209 (*)    All other components within normal limits  TROPONIN I (HIGH SENSITIVITY) - Abnormal; Notable for the following components:   Troponin I (High Sensitivity) 387 (*)    All other components within normal limits  SARS CORONAVIRUS 2 (HOSPITAL ORDER, [facility])  CULTURE, BLOOD (ROUTINE X 2)  CULTURE, BLOOD (ROUTINE X 2)  URINE CULTURE  LACTIC ACID, PLASMA  URINALYSIS, ROUTINE W REFLEX MICROSCOPIC  TYPE AND SCREEN  PREPARE RBC (CROSSMATCH)    EKG EKG Interpretation  Date/Time:  Sunday June 10 2019 17:21:35 EDT Ventricular Rate:  125 PR Interval:    QRS Duration: 95 QT Interval:  307 QTC Calculation: 443 R Axis:   62 Text Interpretation:  Sinus tachycardia Multiple premature complexes, vent & supraven Minimal ST depression, lateral leads No significant change since last tracing Confirmed by Isla Pence [phone]) on 06/10/2019 6:19:48 PM   Radiology Ct Angio Chest Pe W And/or Wo Contrast  Result Date: 06/10/2019 CLINICAL DATA:  Shortness of breath, fever. 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 EXAM: CT ANGIOGRAPHY CHEST WITH CONTRAST TECHNIQUE: Multidetector CT imaging of the chest was performed using the standard protocol during bolus administration of intravenous contrast. Multiplanar CT image reconstructions and MIPs were obtained to evaluate the vascular anatomy. CONTRAST:   19mL OMNIPAQUE IOHEXOL 350 MG/ML SOLN COMPARISON:  Chest radiograph dated 06/10/2019. CTA chest dated 05/27/2019. FINDINGS: Motion degraded images. Cardiovascular: Satisfactory opacification of the bilateral pulmonary arteries to the segmental level. No evidence pulmonary embolism. No evidence of thoracic aortic aneurysm or dissection. Atherosclerotic calcifications of the aortic arch. The heart is normal in size.  No pericardial effusion. 3 vessel coronary atherosclerosis. Mediastinum/Nodes: No suspicious mediastinal lymphadenopathy. Suspected 18 mm right isthmic thyroid nodule (series 4/image 11), not well visualized on the prior study, although of questionable clinical significance in this patient. Lungs/Pleura: Multiple bilateral pulmonary metastases, unchanged from recent prior, although better evaluated on the current study due to reduced motion degradation. Index lesions are as follows: --21 mm right upper lobe nodule (series 6/image 39), previously measured as 19 mm --24 mm superior segment left lower lobe nodule (series 6/image 55), previously measured as 24 mm --27 mm central right lower lobe nodule (series 6/image 109), previously measured as 35 mm Extensive centrilobular and paraseptal emphysematous changes, upper lobe predominant. No focal consolidation. No  pleural effusion or pneumothorax. Upper Abdomen: Embolization coils within the left hepatic lobe with adjacent incompletely visualized treated left hepatic lobe lesion (series 4/image 88). Additional poorly evaluated lesion in the central hepatic dome (series 4/image 78). Cholelithiasis. 6.5 cm right renal cyst. The superior aspect of the known large right abdominal mass is incompletely visualized (series 4/image 111). Musculoskeletal: Degenerative changes of the visualized thoracolumbar spine. Review of the MIP images confirms the above findings. IMPRESSION: No evidence of pulmonary embolism. Bilateral pulmonary metastases, better evaluate than on  the recent prior, likely grossly unchanged. Large right abdominal mass is incompletely visualized. Hepatic metastases, poorly evaluated. Additional ancillary findings as above. Aortic Atherosclerosis (ICD10-I70.0) and Emphysema (ICD10-J43.9). Electronically Signed   By: Julian Hy M.D.   On: 06/10/2019 21:10   Dg Chest Port 1 View  Result Date: 06/10/2019 CLINICAL DATA:  Shortness of breath, fever, history bladder cancer, emphysema, COPD, former smoker, coronary artery disease, hypertension EXAM: PORTABLE CHEST 1 VIEW COMPARISON:  Portable exam 1723 hours compared to 05/26/2019 and correlated with CT chest of 05/27/2019 FINDINGS: Normal heart size, mediastinal contours, and pulmonary vascularity. Atherosclerotic calcification aorta. Emphysematous and minimal bronchitic changes consistent with COPD. BILATERAL pulmonary nodules consistent with metastases. Bibasilar atelectasis versus fibrosis unchanged. No acute infiltrate, pleural effusion, or pneumothorax. No acute osseous findings. IMPRESSION: Changes of COPD with bibasilar chronic interstitial lung disease/fibrosis versus atelectasis. BILATERAL pulmonary metastases. No acute abnormalities. Electronically Signed   By: Lavonia Dana M.D.   On: 06/10/2019 18:15    Procedures Procedures (including critical care time)  Medications Ordered in ED Medications  metroNIDAZOLE (FLAGYL) IVPB 500 mg (500 mg Intravenous New Bag/Given 06/10/19 2124)  vancomycin (VANCOCIN) IVPB 1000 mg/200 mL premix (has no administration in time range)  sodium chloride (PF) 0.9 % injection (0 mLs  Hold 06/10/19 2122)  ipratropium-albuterol (DUONEB) 0.5-2.5 (3) MG/3ML nebulizer solution 3 mL (has no administration in time range)  0.9 %  sodium chloride infusion (Manually program via Guardrails IV Fluids) (has no administration in time range)  morphine 4 MG/ML injection 4 mg (has no administration in time range)  ondansetron (ZOFRAN) injection 4 mg (has no administration in  time range)  LORazepam (ATIVAN) injection 0.5 mg (has no administration in time range)  ipratropium-albuterol (DUONEB) 0.5-2.5 (3) MG/3ML nebulizer solution 3 mL (has no administration in time range)  budesonide (PULMICORT) nebulizer solution 0.25 mg (has no administration in time range)  methylPREDNISolone sodium succinate (SOLU-MEDROL) 125 mg/2 mL injection 125 mg (125 mg Intravenous Given 06/10/19 1744)  albuterol (VENTOLIN HFA) 108 (90 Base) MCG/ACT inhaler 4 puff (4 puffs Inhalation Given 06/10/19 1742)  AeroChamber Plus Flo-Vu Medium MISC 1 each (1 each Other Given 06/10/19 1746)  acetaminophen (TYLENOL) tablet 1,000 mg (1,000 mg Oral Given 06/10/19 1821)  ceFEPIme (MAXIPIME) 2 g in sodium chloride 0.9 % 100 mL IVPB (0 g Intravenous Paused 06/10/19 2046)  sodium chloride 0.9 % bolus 1,000 mL (1,000 mLs Intravenous New Bag/Given 06/10/19 2019)  iohexol (OMNIPAQUE) 350 MG/ML injection 100 mL (100 mLs Intravenous Contrast Given 06/10/19 2044)     Initial Impression / Assessment and Plan / ED Course  I have reviewed the triage vital signs and the nursing notes.  Pertinent labs & imaging results that were available during my care of the patient were reviewed by me and considered in my medical decision making (see chart for details).  Pt was febrile, so he was put on covid precautions initially.  Covid came back negative.  He was given IVFs and  tylenol.  The pt was then put on bipap which has helped his breathing significantly.  The pt had a ct of his chest to r/o PE.  No pe, but multiple pulmonary mets.  The urine is still pending, but the source of sepsis most recently was thought to be the large, necrotic tumor in his abdomen.  The pt also has an elevated troponin.  Pt is not having cp now.  It is likely due to strain.  He is not a candidate for heparin as his plt and hgb are low.  He was d/w cards who said he can stay here.  He is not a candidate for a cardiac cath.  Pt given Maxipime, Flagyl,  and Vancomycin IV for unknown source of sepsis.  Due to anemia and sob, I ordered 1 unit prbcs to be transfused.  Pt d/w Dr. Posey Pronto ([facility]) who will admit.     CRITICAL CARE Performed by: Isla Pence   Total critical care time: 45 minutes  Critical care time was exclusive of separately billable procedures and treating other patients.  Critical care was necessary to treat or prevent imminent or life-threatening deterioration.  Critical care was time spent personally by me on the following activities: development of treatment plan with patient and/or surrogate as well as nursing, discussions with consultants, evaluation of patient's response to treatment, examination of patient, obtaining history from patient or surrogate, ordering and performing treatments and interventions, ordering and review of laboratory studies, ordering and review of radiographic studies, pulse oximetry and re-evaluation of patient's condition.    [name] was evaluated in Emergency Department on 06/10/2019 for the symptoms described in the history of present illness. He was evaluated in the context of the global COVID-19 pandemic, which necessitated consideration that the patient might be at risk for infection with the SARS-CoV-2 virus that causes COVID-19. Institutional protocols and algorithms that pertain to the evaluation of patients at risk for COVID-19 are in a state of rapid change based on information released by regulatory bodies including the CDC and federal and state organizations. These policies and algorithms were followed during the patient's care in the ED.  Final Clinical Impressions(s) / ED Diagnoses   Final diagnoses:  Acute respiratory failure with hypoxia (HCC)  Malignant neoplasm of urinary bladder, unspecified site (HCC)  Elevated troponin  Anemia, unspecified type    ED Discharge Orders    None       Isla Pence, MD 06/10/19 2156

## 2019-06-11 DIAGNOSIS — A419 Sepsis, unspecified organism: Secondary | ICD-10-CM

## 2019-06-11 DIAGNOSIS — I5032 Chronic diastolic (congestive) heart failure: Secondary | ICD-10-CM

## 2019-06-11 DIAGNOSIS — D638 Anemia in other chronic diseases classified elsewhere: Secondary | ICD-10-CM

## 2019-06-11 DIAGNOSIS — I10 Essential (primary) hypertension: Secondary | ICD-10-CM

## 2019-06-11 DIAGNOSIS — F419 Anxiety disorder, unspecified: Secondary | ICD-10-CM

## 2019-06-11 DIAGNOSIS — C679 Malignant neoplasm of bladder, unspecified: Secondary | ICD-10-CM

## 2019-06-11 DIAGNOSIS — J449 Chronic obstructive pulmonary disease, unspecified: Secondary | ICD-10-CM

## 2019-06-11 LAB — BASIC METABOLIC PANEL
Anion gap: 10 (ref 5–15)
BUN: 24 mg/dL — ABNORMAL HIGH (ref 8–23)
CO2: 25 mmol/L (ref 22–32)
Calcium: 7.8 mg/dL — ABNORMAL LOW (ref 8.9–10.3)
Chloride: 97 mmol/L — ABNORMAL LOW (ref 98–111)
Creatinine, Ser: 0.79 mg/dL (ref 0.61–1.24)
GFR calc Af Amer: >60 mL/min (ref >60)
GFR calc non Af Amer: >60 mL/min (ref >60)
Glucose, Bld: 291 mg/dL — ABNORMAL HIGH (ref 70–99)
Potassium: 4.7 mmol/L (ref 3.5–5.1)
Sodium: 132 mmol/L — ABNORMAL LOW (ref 135–145)

## 2019-06-11 LAB — URINALYSIS, ROUTINE W REFLEX MICROSCOPIC
Bilirubin Urine: NEGATIVE
Glucose, UA: NEGATIVE mg/dL
Hgb urine dipstick: NEGATIVE
Ketones, ur: 5 mg/dL — AB
Leukocytes,Ua: NEGATIVE
Nitrite: NEGATIVE
Protein, ur: NEGATIVE mg/dL
Specific Gravity, Urine: >1.046 — ABNORMAL HIGH (ref 1.005–1.030)
pH: 5 (ref 5.0–8.0)

## 2019-06-11 LAB — CBC
HCT: 20.8 % — ABNORMAL LOW (ref 39.0–52.0)
Hemoglobin: 6.4 g/dL — CL (ref 13.0–17.0)
MCH: 30.2 pg (ref 26.0–34.0)
MCHC: 30.8 g/dL (ref 30.0–36.0)
MCV: 98.1 fL (ref 80.0–100.0)
Platelets: 128 10*3/uL — ABNORMAL LOW (ref 150–400)
RBC: 2.12 MIL/uL — ABNORMAL LOW (ref 4.22–5.81)
RDW: 17.5 % — ABNORMAL HIGH (ref 11.5–15.5)
WBC: 7.5 10*3/uL (ref 4.0–10.5)
nRBC: 0 % (ref 0.0–0.2)

## 2019-06-11 LAB — BLOOD CULTURE ID PANEL (REFLEXED)

## 2019-06-11 LAB — TROPONIN I (HIGH SENSITIVITY): Troponin I (High Sensitivity): 204 ng/L (ref <18)

## 2019-06-11 LAB — HEMOGLOBIN AND HEMATOCRIT, BLOOD
HCT: 29.9 % — ABNORMAL LOW (ref 39.0–52.0)
Hemoglobin: 9.6 g/dL — ABNORMAL LOW (ref 13.0–17.0)

## 2019-06-11 LAB — PREPARE RBC (CROSSMATCH)

## 2019-06-11 LAB — MRSA PCR SCREENING: MRSA by PCR: NEGATIVE

## 2019-06-11 MED ORDER — FUROSEMIDE 10 MG/ML IJ SOLN
20.0000 mg | Freq: Two times a day (BID) | INTRAMUSCULAR | Status: DC
  Administered 2019-06-11 – 2019-06-15 (×8): 20 mg via INTRAVENOUS
  Filled 2019-06-11 (×8): qty 2

## 2019-06-11 MED ORDER — VANCOMYCIN HCL 10 G IV SOLR: 1250.0000 mg | Freq: Every day | INTRAVENOUS | Status: DC

## 2019-06-11 MED ORDER — FUROSEMIDE 10 MG/ML IJ SOLN
40.0000 mg | Freq: Once | INTRAMUSCULAR | Status: AC
  Administered 2019-06-11: 40 mg via INTRAVENOUS
  Filled 2019-06-11: qty 4

## 2019-06-11 MED ORDER — SODIUM CHLORIDE 0.9 % IV SOLN
2.0000 g | Freq: Three times a day (TID) | INTRAVENOUS | Status: DC
  Administered 2019-06-11 – 2019-06-13 (×8): 2 g via INTRAVENOUS
  Filled 2019-06-11 (×10): qty 2

## 2019-06-11 MED ORDER — SODIUM CHLORIDE 0.9% IV SOLUTION
Freq: Once | INTRAVENOUS | Status: AC
  Administered 2019-06-11: 08:00:00 via INTRAVENOUS

## 2019-06-11 MED ORDER — VANCOMYCIN HCL 10 G IV SOLR
1750.0000 mg | Freq: Once | INTRAVENOUS | Status: AC
  Administered 2019-06-11: 1750 mg via INTRAVENOUS
  Filled 2019-06-11: qty 1750

## 2019-06-11 NOTE — TOC Initial Note (Signed)
Transition of Care [facility]) - Initial/Assessment Note    Patient Details  Name: [name] MRN: [id number] Date of Birth: 11-09-1946  Transition of Care [facility]) CM/SW Contact:    Nila Nephew, LCSW Phone Number: [phone] 06/11/2019, 10:45 AM  Clinical Narrative:     Completed high readmission risk screening due to score 48%. Pt admitted with COPD exacerbation and sepsis. Has community palliative care. Please consult TOC team for disposition needs.        Activities of Daily Living Home Assistive Devices/Equipment: Oxygen, BIPAP, Eyeglasses ADL Screening (condition at time of admission) Patient's cognitive ability adequate to safely complete daily activities?: Yes Is the patient deaf or have difficulty hearing?: Yes Does the patient have difficulty seeing, even when wearing glasses/contacts?: Yes Does the patient have difficulty concentrating, remembering, or making decisions?: No Patient able to express need for assistance with ADLs?: Yes Does the patient have difficulty dressing or bathing?: Yes Independently performs ADLs?: Yes (appropriate for developmental age) Communication: Independent Dressing (OT): Needs assistance Is this a change from baseline?: Pre-admission baseline Grooming: Needs assistance Is this a change from baseline?: Pre-admission baseline Feeding: Independent Bathing: Needs assistance Is this a change from baseline?: Pre-admission baseline Toileting: Needs assistance Is this a change from baseline?: Pre-admission baseline In/Out Bed: Needs assistance Is this a change from baseline?: Pre-admission baseline Walks in Home: Independent Does the patient have difficulty walking or climbing stairs?: No Weakness of Legs: None Weakness of Arms/Hands: None  Permission Sought/Granted                  Emotional Assessment              Admission diagnosis:  Elevated troponin [R79.89] Acute respiratory failure with hypoxia (HCC)  [J96.01] Malignant neoplasm of urinary bladder, unspecified site ([place]) [C67.9] Anemia, unspecified type [D64.9] Patient Active Problem List   Diagnosis Date Noted  . Acute on chronic respiratory failure ([place]) 06/10/2019  . Sepsis ([place]) 05/27/2019  . COPD exacerbation ([place]) 05/27/2019  . Shortness of breath   . Symptomatic anemia 05/06/2019  . Anemia of chronic disease   . OSA (obstructive sleep apnea)   . SBO (small bowel obstruction) ([place]) 03/27/2019  . Nausea vomiting and diarrhea   . Weakness   . SOB (shortness of breath)   . Elevated troponin   . Palliative care by specialist   . Goals of care, counseling/discussion   . Abdominal pain   . GI bleed 02/18/2019  . DNR (do not resuscitate) 12/21/2018  . Anemia 12/14/2018  . Liver metastasis ([place]) 07/13/2018  . Acute blood loss anemia 06/07/2018  . Rectal bleeding 05/13/2018  . Chronic diastolic CHF (congestive heart failure) ([place]) 05/13/2018  . Lower GI bleed 05/13/2018  . Class 2 obesity with body mass index (BMI) of 37.0 to 37.9 in adult 01/10/2018  . 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 Acute on chronic diastolic CHF (congestive heart failure) ([place]) 01/19/2017  . Chronic respiratory failure with hypoxia and hypercapnia ([place]) 10/13/2016  . OSA treated with BiPAP 10/13/2016  . Macrocytic anemia 09/24/2016  . COPD with acute exacerbation ([place]) 09/24/2016  . Acute bronchitis 02/03/2016  . Ex-smoker 10/31/2015  . Dilated cardiomyopathy ([place])   . Pulmonary hypertension ([place])   . Acute on chronic respiratory failure with hypoxia and hypercapnia ([place]) 10/20/2015  . 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 LBP (low back pain) 03/24/2011  . Malignant neoplasm of bladder ([place]) 09/22/2010  . HEMATURIA UNSPECIFIED 05/14/2010  . Osteoarthritis 03/28/2010  .  Coronary atherosclerosis of native coronary artery 12/19/2007  . Acute sinusitis 12/19/2007  . End stage COPD ([place]) 12/19/2007  . COLONIC POLYPS 12/18/2007  .  HYPERCHOLESTEROLEMIA 12/18/2007  . Anxiety 12/18/2007  . Essential hypertension 12/18/2007  . GASTRITIS 12/18/2007  . Impaired fasting glucose 12/18/2007   PCP:  [name], MD Pharmacy:   [id number] [address] [place] [address] Phone: [phone] Fax: [place] [address] Phone: [phone] Fax: [place] [address] Phone: [phone] Fax: [phone]  [place] [place] [place] [address] Phone: [phone] Fax: [phone]     Social Determinants of Health (SDOH) Interventions    Readmission Risk Interventions Readmission Risk Prevention Plan 06/11/2019 05/28/2019  Transportation Screening Complete Complete  Medication Review Press photographer) Not Complete Complete  Med Review Comments some medications needs review per admission summary -  PCP or Specialist appointment within 3-5 days of discharge Not Complete Not Complete  PCP/Specialist Appt Not Complete comments established with PCP, DC date not known -  HRI or [place] Complete Not Complete  HRI or Home Care Consult Pt Refusal Comments - no indication  SW Recovery Care/Counseling Consult Complete Complete  Palliative Care Screening Not Applicable Complete  Skilled Nursing Facility Not Applicable Not Applicable  Some recent data might be hidden

## 2019-06-11 NOTE — Progress Notes (Signed)
Pharmacy Antibiotic Note  [name] is a 73 y.o. male admitted on 06/10/2019 with sepsis.  Pharmacy has been consulted for cefepime and vancomycin dosing.  Plan: Cefepime 2 Gm IV q8h Vancomycin 1750 mg x1 then 1250 mg IV q24h for est AUC = 517 Use scr = 0.94 and Vd= 0.5 Goal AUC = 400-550 F/u scr/cultures/levels  Height: 5\' 6"  (167.6 cm) Weight: 190 lb (86.2 kg) IBW/kg (Calculated) : 63.8  Temp (24hrs), Avg:99 F (37.2 C), Min:97.5 F (36.4 C), Max:100.9 F (38.3 C)  Recent Labs  Lab 06/10/19 1727 06/10/19 1728 06/10/19 1904 06/10/19 1928  WBC  --   --  16.0*  --   CREATININE 0.94  --   --   --   LATICACIDVEN  --  3.1*  --  1.7    Estimated Creatinine Clearance: 71 mL/min (by C-G formula based on SCr of 0.94 mg/dL).    Allergies  Allergen Reactions  . Amlodipine Swelling    Antimicrobials this admission: 7/26 cefepime >>  7/26 flagyl >> x1 7/27 vancomycin >>   Dose adjustments this admission:   Microbiology results:  BCx:   UCx:    Sputum:    MRSA PCR:   Thank you for allowing pharmacy to be a part of this patient's care.  Dorrene German 06/11/2019 12:11 AM

## 2019-06-11 NOTE — Progress Notes (Signed)
PHARMACY - PHYSICIAN COMMUNICATION CRITICAL VALUE ALERT - BLOOD CULTURE IDENTIFICATION (BCID)  [name] is an 73 y.o. male who presented to [facility] on 06/10/2019 with a chief complaint of SOB  Assessment:  Necrotic bladder tumor  Name of physician (or Provider) Contacted: [place] via secure chat and AMION  Current antibiotics: Vancomycin and Cefepime  Changes to prescribed antibiotics recommended:  DC vancomycin and continue Cefepime  Results for orders placed or performed during the hospital encounter of 06/10/19  Blood Culture ID Panel (Reflexed) (Collected: 06/10/2019  5:28 PM)  Result Value Ref Range   Enterococcus species NOT DETECTED NOT DETECTED   Listeria monocytogenes NOT DETECTED NOT DETECTED   Staphylococcus species NOT DETECTED NOT DETECTED   Staphylococcus aureus (BCID) NOT DETECTED NOT DETECTED   Streptococcus species NOT DETECTED NOT DETECTED   Streptococcus agalactiae NOT DETECTED NOT DETECTED   Streptococcus pneumoniae NOT DETECTED NOT DETECTED   Streptococcus pyogenes NOT DETECTED NOT DETECTED   Acinetobacter baumannii NOT DETECTED NOT DETECTED   Enterobacteriaceae species DETECTED (A) NOT DETECTED   Enterobacter cloacae complex DETECTED (A) NOT DETECTED   Escherichia coli NOT DETECTED NOT DETECTED   Klebsiella oxytoca NOT DETECTED NOT DETECTED   Klebsiella pneumoniae NOT DETECTED NOT DETECTED   Proteus species NOT DETECTED NOT DETECTED   Serratia marcescens NOT DETECTED NOT DETECTED   Carbapenem resistance NOT DETECTED NOT DETECTED   Haemophilus influenzae NOT DETECTED NOT DETECTED   Neisseria meningitidis NOT DETECTED NOT DETECTED   Pseudomonas aeruginosa NOT DETECTED NOT DETECTED   Candida albicans NOT DETECTED NOT DETECTED   Candida glabrata NOT DETECTED NOT DETECTED   Candida krusei NOT DETECTED NOT DETECTED   Candida parapsilosis NOT DETECTED NOT DETECTED   Candida tropicalis NOT DETECTED NOT DETECTED   Eudelia Bunch,  Pharm.D [phone] 06/11/2019 3:59 PM

## 2019-06-11 NOTE — Progress Notes (Signed)
PROGRESS NOTE    REICHEN HUTZLER  HKV:[id number] DOB: 02/07/1946 DOA: 06/10/2019 PCP: [name], MD   Brief Narrative:  Per HP: [name] is a 73 y.o. male with medical history significant for metastatic bladder cancer, COPD with chronic respiratory failure on 6 L supplemental O2 via [place], CAD s/p PCI with DES, chronic diastolic CHF, hypertension, hyperlipidemia, anemia of chronic disease, anxiety, and OSA on BiPAP who presents to the ED for evaluation of dyspnea.  Patient was recently admitted from 05/26/2019-06/02/2019 at which time he was treated for hypoxic respiratory failure secondary to COPD exacerbation.  He was also found to have sepsis due to E. coli bacteremia felt secondary to necrotic bladder tumor.  Patient was feeling well until today when he developed new onset shortness of breath when he got up to go outside of his house.  He had noticed some wheezing as well.  He states that he felt his lungs were getting tight and he denies using home nebulizer treatment and time therefore called EMS.  He denies any chest pain, palpitations, dysuria.  He has abdominal pain from his bladder tumor.  He reports swelling in both of his legs which he feels is stable.  Per ED documentation patient was on his home 6 L O2 via [place] with O2 saturation in the 80s on EMS arrival.  He was placed on BiPAP with relief.  When taken off BiPAP he again became very short of breath.  He was also noted to have a temperature 102 Fahrenheit.  He was subsequently brought to the ED for further evaluation.   Assessment & Plan:   Principal Problem:   Acute on chronic respiratory failure (HCC) Active Problems:   Malignant neoplasm of bladder (HCC)   Anxiety   Essential hypertension   End stage COPD (HCC)   OSA treated with BiPAP   Chronic diastolic CHF (congestive heart failure) (HCC)   Anemia of chronic disease   Sepsis ([place])   Acute on chronic respiratory failure due to exacerbation of COPD: Agree,  symptoms appear primarily secondary to exacerbation of COPD with likely contribution from CHF and known pulmonary metastases.  Respiratory status significantly improved with BiPAP. -continue BiPAP as needed and nightly, currently weaned off-6L 02 at home -Continue Pulmicort, duo nebs, and as needed albuterol   Sepsis: Patient with recent E. coli bacteremia now presenting with fever, leukocytosis, tachypnea, tachycardia.  Urinalysis negative, however obtained after patient received antibiotics. -Continue IV vancomycin and cefepime for now, de-escalate as able -Follow blood cultures  Elevated troponin in setting of CAD s/p PCI w/ DES in 2008: Suspect type II NSTEMI from respiratory failure and sepsis as above.  Per EDP discussion with cardiology he is not an interventional candidate. -Repeat troponin I -Continue simvastatin and Lopressor -Not on aspirin due to previous concern for GI bleeding  Acute on chronic diastolic CHF: Patient hypervolemic on admission.  He uses Lasix as needed on an outpatient basis -Given IV Lasix 40 mg once. 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  Status post radiation therapy, neurosurgery and chemotherapy per patient.  Follows with palliative care.   Anemia of chronic disease: Hemoglobin 8.0 on admission.  Recently required transfusion due to low hemoglobin of 6.7 on 05/28/2019. -Repeat labs in a.m., consider transfusion if hemoglobin declines  Hypertension: BP borderline low.  Resume metoprolol tomorrow as BP allows.  Hyperlipidemia: Continue simvastatin.  Anxiety: Continue  home Xanax as needed.  OSA: Continue BiPAP nightly.  DVT prophylaxis: SCD/Compression stockings  Code Status:     Code Status Orders  (From admission, onward)         Start     Ordered   06/10/19 2341  Do not attempt resuscitation (DNR)  Continuous      Question Answer Comment  In the event of cardiac or respiratory ARREST Do not call a code blue   In the event of cardiac or respiratory ARREST Do not perform Intubation, CPR, defibrillation or ACLS   In the event of cardiac or respiratory ARREST Use medication by any route, position, wound care, and other measures to relive pain and suffering. May use oxygen, suction and manual treatment of airway obstruction as needed for comfort.      06/10/19 2342        Code Status History    Date Active Date Inactive Code Status Order ID Comments User Context   05/27/2019 0354 06/02/2019 1757 DNR [id number]  Ivor Costa, MD Inpatient   05/27/2019 0229 05/27/2019 0354 DNR [id number]  Ivor Costa, MD ED   05/06/2019 1220 05/07/2019 1359 DNR [id number]  Elodia Florence., MD Inpatient   05/06/2019 0422 05/06/2019 1220 Full Code [id number]  Hugelmeyer, Sedillo, DO Inpatient   04/23/2019 1510 04/24/2019 2219 DNR [id number]  Mendel Corning, MD Inpatient   03/27/2019 1738 03/30/2019 1830 DNR [id number]  Georgette Shell, MD ED   02/18/2019 1539 02/21/2019 2028 Partial Code [id number]  Kayleen Memos, DO ED   02/18/2019 1527 02/18/2019 1539 DNR [id number]  Kayleen Memos, DO ED   02/18/2019 1519 02/18/2019 1527 Full Code [id number]  Kayleen Memos, DO ED   12/14/2018 2025 12/15/2018 2042 DNR [id number]  Elmarie Shiley, MD Inpatient   06/07/2018 2032 06/12/2018 0045 Full Code [id number]  Etta Quill, DO ED   05/13/2018 2145 05/18/2018 2157 Full Code [id number]  Toy Baker, MD Inpatient   01/19/2017 1957 01/26/2017 1550 Full Code [id number]  Rama, Venetia Maxon, MD ED   09/24/2016 0246 09/29/2016 2000 Full Code [id number]  Vianne Bulls, MD ED   10/20/2015 1934 10/29/2015 1653 Full Code [id number]  Mendel Corning, MD Inpatient   Advance Care Planning Activity    Advance Directive Documentation     Most Recent Value  Type of Advance Directive  Living will  Pre-existing out of facility DNR order (yellow form or pink MOST form)  --   "MOST" Form in Place?  --     Family Communication: none today  Disposition Plan:   Patient will remain inpatient for treatment of respiratory failure secondary to likely CHF exacerbation and pneumonia.  He will require IV antibiotics, IV diuresis, respiratory support and frequent nurse intervention.  Without these treatments patient at risk of severe life-threatening clinical deterioration Consults called: None Admission status: Inpatient   Consultants:   None  Procedures:  Ct Angio Chest Pe W And/or Wo Contrast  Result Date: 06/10/2019 CLINICAL DATA:  Shortness of breath, fever. 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 EXAM: CT ANGIOGRAPHY CHEST WITH CONTRAST TECHNIQUE: Multidetector CT imaging of the chest was performed using the standard protocol during bolus administration of intravenous contrast. Multiplanar CT image reconstructions and MIPs were obtained to evaluate the vascular anatomy. CONTRAST:  138mL OMNIPAQUE IOHEXOL 350 MG/ML SOLN COMPARISON:  Chest radiograph dated 06/10/2019. CTA chest dated 05/27/2019. FINDINGS: Motion degraded images. Cardiovascular: Satisfactory opacification of the bilateral pulmonary arteries to the  segmental level. No evidence pulmonary embolism. No evidence of thoracic aortic aneurysm or dissection. Atherosclerotic calcifications of the aortic arch. The heart is normal in size.  No pericardial effusion. 3 vessel coronary atherosclerosis. Mediastinum/Nodes: No suspicious mediastinal lymphadenopathy. Suspected 18 mm right isthmic thyroid nodule (series 4/image 11), not well visualized on the prior study, although of questionable clinical significance in this patient. Lungs/Pleura: Multiple bilateral pulmonary metastases, unchanged from recent prior, although better evaluated on the current study due to reduced motion degradation. Index lesions are as follows: --21 mm right upper lobe nodule (series 6/image 39), previously  measured as 19 mm --24 mm superior segment left lower lobe nodule (series 6/image 55), previously measured as 24 mm --27 mm central right lower lobe nodule (series 6/image 109), previously measured as 35 mm Extensive centrilobular and paraseptal emphysematous changes, upper lobe predominant. No focal consolidation. No pleural effusion or pneumothorax. Upper Abdomen: Embolization coils within the left hepatic lobe with adjacent incompletely visualized treated left hepatic lobe lesion (series 4/image 88). Additional poorly evaluated lesion in the central hepatic dome (series 4/image 78). Cholelithiasis. 6.5 cm right renal cyst. The superior aspect of the known large right abdominal mass is incompletely visualized (series 4/image 111). Musculoskeletal: Degenerative changes of the visualized thoracolumbar spine. Review of the MIP images confirms the above findings. IMPRESSION: No evidence of pulmonary embolism. Bilateral pulmonary metastases, better evaluate than on the recent prior, likely grossly unchanged. Large right abdominal mass is incompletely visualized. Hepatic metastases, poorly evaluated. Additional ancillary findings as above. Aortic Atherosclerosis (ICD10-I70.0) and Emphysema (ICD10-J43.9). Electronically Signed   By: Julian Hy M.D.   On: 06/10/2019 21:10   Ct Angio Chest Pe W And/or Wo Contrast  Result Date: 05/27/2019 CLINICAL DATA:  Shortness of breath, weakness, fever. 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 EXAM: CT ANGIOGRAPHY CHEST CT ABDOMEN AND PELVIS WITH CONTRAST TECHNIQUE: Multidetector CT imaging of the chest was performed using the standard protocol during bolus administration of intravenous contrast. Multiplanar CT image reconstructions and MIPs were obtained to evaluate the vascular anatomy. Multidetector CT imaging of the abdomen and pelvis was performed using the standard protocol during bolus administration of intravenous contrast.  CONTRAST:  129mL OMNIPAQUE IOHEXOL 350 MG/ML SOLN COMPARISON:  03/27/2019. FINDINGS: CTA CHEST FINDINGS Cardiovascular: Motion degraded images. Within that constraint, there is satisfactory opacification of the bilateral pulmonary arteries to the lobar level. No evidence of pulmonary embolism. No evidence of thoracic aortic aneurysm. Atherosclerotic calcifications of the aortic arch. Heart is normal in size.  No pericardial effusion. Three vessel coronary atherosclerosis. Mediastinum/Nodes: No suspicious mediastinal lymphadenopathy. Visualized thyroid is unremarkable. Lungs/Pleura: Bilateral pulmonary nodules/metastases, motion degraded but favored to be mildly progressive, including: --1.9 cm right upper lobe nodule (series 12/image 53), previously 1.5 cm --2.4 cm superior segment left lower lobe nodule (series 12/image 53), previously 1.8 cm --3.5 cm central right lower lobe mass (series 12/image 109), previously 2.6 cm Extensive centrilobular and paraseptal emphysematous changes, upper lung predominant. No focal consolidation. No pleural effusion or pneumothorax. Musculoskeletal: Degenerative changes of the thoracic spine. Review of the MIP images confirms the above findings. CT ABDOMEN and PELVIS FINDINGS Motion degraded images. Hepatobiliary: Embolization coils within a branch of the distal left hepatic artery. Partially calcified treated left hepatic lesion in segment 2 measures 5.2 x 3.7 cm, previously 4.8 x 3.7 cm, grossly unchanged. 3.5 x 2.9 cm hypodense lesion in segment 4A (series 4/image 11), previously 3.8 x 2.8 cm, unchanged. Additional 14 mm lesion inferiorly in the  right hepatic lobe (series 4/image 27), similar to the prior. Gallbladder is unremarkable. No intrahepatic or extrahepatic ductal dilatation. Pancreas: Within normal limits. Spleen: Within normal limits. Adrenals/Urinary Tract: Adrenal glands are within normal limits. 6.8 cm right upper pole renal cyst. Left kidney is within normal  limits. No hydronephrosis. Bladder is underdistended but unremarkable. Stomach/Bowel: Stomach is within normal limits. No evidence of bowel obstruction. Normal appendix (series 4/image 46). Left colon is decompressed. Mild sigmoid diverticulosis, without evidence of diverticulitis. Vascular/Lymphatic: No evidence of abdominal aortic aneurysm. Atherosclerotic calcifications of the abdominal aorta and branch vessels. No suspicious abdominopelvic lymphadenopathy. Reproductive: Prostate is unremarkable. Other: 16.7 x 26.0 x 18.7 cm mass in the right mid abdomen, previously 16.0 x 24.0 x 18.5 cm, mildly increased (calculated volume = 4250 mL versus 3720 mL on the prior). Interval development of necrosis with gas along the left superior aspect of the mass (series 4/image 45). No abdominopelvic ascites. Musculoskeletal: Degenerative changes of the visualized thoracolumbar spine, most prominent at L5-S1. Review of the MIP images confirms the above findings. IMPRESSION: No evidence of pulmonary embolism. 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 Status post chemoembolization. Hepatic metastases are grossly unchanged. Progressive pulmonary metastases, as described above, although poorly evaluated due to motion degradation. Aortic Atherosclerosis (ICD10-I70.0) and Emphysema (ICD10-J43.9). Electronically Signed   By: Julian Hy M.D.   On: 05/27/2019 03:48   Ct Abdomen Pelvis W Contrast  Result Date: 05/27/2019 CLINICAL DATA:  Shortness of breath, weakness, fever. 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 EXAM: CT ANGIOGRAPHY CHEST CT ABDOMEN AND PELVIS WITH CONTRAST TECHNIQUE: Multidetector CT imaging of the chest was performed using the standard protocol during bolus administration of intravenous contrast. Multiplanar CT image reconstructions and MIPs were obtained to evaluate the vascular anatomy. Multidetector CT  imaging of the abdomen and pelvis was performed using the standard protocol during bolus administration of intravenous contrast. CONTRAST:  149mL OMNIPAQUE IOHEXOL 350 MG/ML SOLN COMPARISON:  03/27/2019. FINDINGS: CTA CHEST FINDINGS Cardiovascular: Motion degraded images. Within that constraint, there is satisfactory opacification of the bilateral pulmonary arteries to the lobar level. No evidence of pulmonary embolism. No evidence of thoracic aortic aneurysm. Atherosclerotic calcifications of the aortic arch. Heart is normal in size.  No pericardial effusion. Three vessel coronary atherosclerosis. Mediastinum/Nodes: No suspicious mediastinal lymphadenopathy. Visualized thyroid is unremarkable. Lungs/Pleura: Bilateral pulmonary nodules/metastases, motion degraded but favored to be mildly progressive, including: --1.9 cm right upper lobe nodule (series 12/image 53), previously 1.5 cm --2.4 cm superior segment left lower lobe nodule (series 12/image 53), previously 1.8 cm --3.5 cm central right lower lobe mass (series 12/image 109), previously 2.6 cm Extensive centrilobular and paraseptal emphysematous changes, upper lung predominant. No focal consolidation. No pleural effusion or pneumothorax. Musculoskeletal: Degenerative changes of the thoracic spine. Review of the MIP images confirms the above findings. CT ABDOMEN and PELVIS FINDINGS Motion degraded images. Hepatobiliary: Embolization coils within a branch of the distal left hepatic artery. Partially calcified treated left hepatic lesion in segment 2 measures 5.2 x 3.7 cm, previously 4.8 x 3.7 cm, grossly unchanged. 3.5 x 2.9 cm hypodense lesion in segment 4A (series 4/image 11), previously 3.8 x 2.8 cm, unchanged. Additional 14 mm lesion inferiorly in the right hepatic lobe (series 4/image 27), similar to the prior. Gallbladder is unremarkable. No intrahepatic or extrahepatic ductal dilatation. Pancreas: Within normal limits. Spleen: Within normal limits.  Adrenals/Urinary Tract: Adrenal glands are within normal limits. 6.8 cm right  upper pole renal cyst. Left kidney is within normal limits. No hydronephrosis. Bladder is underdistended but unremarkable. Stomach/Bowel: Stomach is within normal limits. No evidence of bowel obstruction. Normal appendix (series 4/image 46). Left colon is decompressed. Mild sigmoid diverticulosis, without evidence of diverticulitis. Vascular/Lymphatic: No evidence of abdominal aortic aneurysm. Atherosclerotic calcifications of the abdominal aorta and branch vessels. No suspicious abdominopelvic lymphadenopathy. Reproductive: Prostate is unremarkable. Other: 16.7 x 26.0 x 18.7 cm mass in the right mid abdomen, previously 16.0 x 24.0 x 18.5 cm, mildly increased (calculated volume = 4250 mL versus 3720 mL on the prior). Interval development of necrosis with gas along the left superior aspect of the mass (series 4/image 45). No abdominopelvic ascites. Musculoskeletal: Degenerative changes of the visualized thoracolumbar spine, most prominent at L5-S1. Review of the MIP images confirms the above findings. IMPRESSION: No evidence of pulmonary embolism. 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 Status post chemoembolization. Hepatic metastases are grossly unchanged. Progressive pulmonary metastases, as described above, although poorly evaluated due to motion degradation. Aortic Atherosclerosis (ICD10-I70.0) and Emphysema (ICD10-J43.9). Electronically Signed   By: Julian Hy M.D.   On: 05/27/2019 03:48   Dg Chest Port 1 View  Result Date: 06/10/2019 CLINICAL DATA:  Shortness of breath, fever, history bladder cancer, emphysema, COPD, former smoker, coronary artery disease, hypertension EXAM: PORTABLE CHEST 1 VIEW COMPARISON:  Portable exam 1723 hours compared to 05/26/2019 and correlated with CT chest of 05/27/2019 FINDINGS: Normal heart size, mediastinal contours, and pulmonary  vascularity. Atherosclerotic calcification aorta. Emphysematous and minimal bronchitic changes consistent with COPD. BILATERAL pulmonary nodules consistent with metastases. Bibasilar atelectasis versus fibrosis unchanged. No acute infiltrate, pleural effusion, or pneumothorax. No acute osseous findings. IMPRESSION: Changes of COPD with bibasilar chronic interstitial lung disease/fibrosis versus atelectasis. BILATERAL pulmonary metastases. No acute abnormalities. Electronically Signed   By: Lavonia Dana M.D.   On: 06/10/2019 18:15   Dg Chest Port 1 View  Result Date: 05/26/2019 CLINICAL DATA:  Shortness of breath and fever for several hours EXAM: PORTABLE CHEST 1 VIEW COMPARISON:  05/06/2019 FINDINGS: Cardiac shadow is stable. Aortic calcifications are seen. The lungs are well aerated bilaterally with emphysematous changes and crowding of the markings in the bases. Multiple bilateral pulmonary nodules are seen. Slight increased density in the bases is noted when compare with the prior exam likely representing some superimposed atelectatic change. IMPRESSION: Multifocal pulmonary nodules consistent with metastatic disease. Mild increased atelectasis in the bases superimposed over changes of COPD. Electronically Signed   By: Inez Catalina M.D.   On: 05/26/2019 23:46   Vas [place] Lower Extremity Venous (dvt)  Result Date: 05/28/2019  Lower Venous Study Indications: Edema. Other Indications: D dimer 3.3. Limitations: Poor ultrasound/tissue interface. Comparison Study: no prior Performing Technologist: June Leap RDMS, RVT  Examination Guidelines: A complete evaluation includes B-mode imaging, spectral Doppler, color Doppler, and power Doppler as needed of all accessible portions of each vessel. Bilateral testing is considered an integral part of a complete examination. Limited examinations for reoccurring indications may be performed as noted.   +---------+---------------+---------+-----------+----------+--------------+  RIGHT     Compressibility Phasicity Spontaneity Properties Summary         +---------+---------------+---------+-----------+----------+--------------+  CFV       Full            Yes       Yes                                    +---------+---------------+---------+-----------+----------+--------------+  SFJ       Full                                                             +---------+---------------+---------+-----------+----------+--------------+  FV Prox   Full                                                             +---------+---------------+---------+-----------+----------+--------------+  FV Mid    Full                                                             +---------+---------------+---------+-----------+----------+--------------+  FV Distal Full                                                             +---------+---------------+---------+-----------+----------+--------------+  PFV       Full                                                             +---------+---------------+---------+-----------+----------+--------------+  POP       Full            Yes       Yes                                    +---------+---------------+---------+-----------+----------+--------------+  PTV       Full                                                             +---------+---------------+---------+-----------+----------+--------------+  PERO                                                       Not visualized  +---------+---------------+---------+-----------+----------+--------------+   +---------+---------------+---------+-----------+----------+-------+  LEFT      Compressibility Phasicity Spontaneity Properties Summary  +---------+---------------+---------+-----------+----------+-------+  CFV       Full            Yes       Yes                             +---------+---------------+---------+-----------+----------+-------+  SFJ        Full                                                      +---------+---------------+---------+-----------+----------+-------+  FV Prox   Full                                                      +---------+---------------+---------+-----------+----------+-------+  FV Mid    Full                                                      +---------+---------------+---------+-----------+----------+-------+  FV Distal Full                                                      +---------+---------------+---------+-----------+----------+-------+  PFV       Full                                                      +---------+---------------+---------+-----------+----------+-------+  POP       Full            Yes       Yes                             +---------+---------------+---------+-----------+----------+-------+  PTV       Full                                                      +---------+---------------+---------+-----------+----------+-------+  PERO      Full                                                      +---------+---------------+---------+-----------+----------+-------+     Summary: Right: There is no evidence of deep vein thrombosis in the lower extremity. No cystic structure found in the popliteal fossa. Left: There is no evidence of deep vein thrombosis in the lower extremity. No cystic structure found in the popliteal fossa.  *See table(s) above for measurements and observations. Electronically signed by Monica Martinez MD on 05/28/2019 at 5:05:23 PM.    Final      Antimicrobials:   vanc and cefepime >7/26 day 2   Subjective: Patient reports he still feels short of breath although improved from admission No acute events overnight he was weaned off BiPAP  Objective: Vitals:  06/11/19 0900 06/11/19 1000 06/11/19 1111 06/11/19 1126  BP: 108/62 (!) 102/44 (!) 107/55 (!) 119/54  Pulse: 79 76 78   Resp: 19 20    Temp:   98.1 F (36.7 C) 98 F (36.7 C)  TempSrc:   Oral Oral  SpO2:  100% 100% 100%   Weight:      Height:        Intake/Output Summary (Last 24 hours) at 06/11/2019 1141 Last data filed at 06/11/2019 1104 Gross per 24 hour  Intake 899.95 ml  Output 1350 ml  Net -450.05 ml   Filed Weights   06/10/19 1726 06/11/19 0500  Weight: 86.2 kg 87.2 kg    Examination:  General exam: Appears calm and comfortable  Respiratory system: Mild rales bilaterally, no accessory muscle use Cardiovascular system: S1 & S2 heard, RRR. No JVD, murmurs, rubs, gallops or clicks. No pedal edema. Gastrointestinal system: Abdomen is nondistended, soft and nontender. No organomegaly or masses felt. Normal bowel sounds heard. Central nervous system: Alert and oriented. No focal neurological deficits. Extremities: Symmetric 5 x 5 strength, WWP, 1+ EDEMA Skin: No rashes, lesions or ulcers Psychiatry: Judgement and insight appear normal. Mood & affect appropriate.     Data Reviewed: I have personally reviewed following labs and imaging studies  CBC: Recent Labs  Lab 06/10/19 1904 06/11/19 0556  WBC 16.0* 7.5  NEUTROABS 14.8*  --   HGB 8.0* 6.4*  HCT 24.6* 20.8*  MCV 95.0 98.1  PLT 138* 027*   Basic Metabolic Panel: Recent Labs  Lab 06/10/19 1727 06/11/19 0556  NA 131* 132*  K 4.4 4.7  CL 93* 97*  CO2 27 25  GLUCOSE 208* 291*  BUN 24* 24*  CREATININE 0.94 0.79  CALCIUM 8.4* 7.8*   GFR: Estimated Creatinine Clearance: 83.9 mL/min (by C-G formula based on SCr of 0.79 mg/dL). Liver Function Tests: No results for input(s): AST, ALT, ALKPHOS, BILITOT, PROT, ALBUMIN in the last 168 hours. No results for input(s): LIPASE, AMYLASE in the last 168 hours. No results for input(s): AMMONIA in the last 168 hours. Coagulation Profile: No results for input(s): INR, PROTIME in the last 168 hours. Cardiac Enzymes: No results for input(s): CKTOTAL, CKMB, CKMBINDEX, TROPONINI in the last 168 hours. BNP (last 3 results) No results for input(s): PROBNP in the last 8760  hours. HbA1C: No results for input(s): HGBA1C in the last 72 hours. CBG: No results for input(s): GLUCAP in the last 168 hours. Lipid Profile: No results for input(s): CHOL, HDL, LDLCALC, TRIG, CHOLHDL, LDLDIRECT in the last 72 hours. Thyroid Function Tests: No results for input(s): TSH, T4TOTAL, FREET4, T3FREE, THYROIDAB in the last 72 hours. Anemia Panel: No results for input(s): VITAMINB12, FOLATE, FERRITIN, TIBC, IRON, RETICCTPCT in the last 72 hours. Sepsis Labs: Recent Labs  Lab 06/10/19 1728 06/10/19 1928  LATICACIDVEN 3.1* 1.7    Recent Results (from the past 240 hour(s))  SARS Coronavirus 2 (CEPHEID- Performed in [facility] lab), Hosp Order     Status: None   Collection Time: 06/10/19  5:28 PM   Specimen: Nasopharyngeal Swab  Result Value Ref Range Status   SARS Coronavirus 2 NEGATIVE NEGATIVE Final    Comment: (NOTE) If result is NEGATIVE SARS-CoV-2 target nucleic acids are NOT DETECTED. The SARS-CoV-2 RNA is generally detectable in upper and lower  respiratory specimens during the acute phase of infection. The lowest  concentration of SARS-CoV-2 viral copies this assay can detect is 250  copies / mL. A negative result does  not preclude SARS-CoV-2 infection  and should not be used as the sole basis for treatment or other  patient management decisions.  A negative result may occur with  improper specimen collection / handling, submission of specimen other  than nasopharyngeal swab, presence of viral mutation(s) within the  areas targeted by this assay, and inadequate number of viral copies  (<250 copies / mL). A negative result must be combined with clinical  observations, patient history, and epidemiological information. If result is POSITIVE SARS-CoV-2 target nucleic acids are DETECTED. The SARS-CoV-2 RNA is generally detectable in upper and lower  respiratory specimens dur ing the acute phase of infection.  Positive  results are indicative of active  infection with SARS-CoV-2.  Clinical  correlation with patient history and other diagnostic information is  necessary to determine patient infection status.  Positive results do  not rule out bacterial infection or co-infection with other viruses. If result is PRESUMPTIVE POSTIVE SARS-CoV-2 nucleic acids MAY BE PRESENT.   A presumptive positive result was obtained on the submitted specimen  and confirmed on repeat testing.  While 2019 novel coronavirus  (SARS-CoV-2) nucleic acids may be present in the submitted sample  additional confirmatory testing may be necessary for epidemiological  and / or clinical management purposes  to differentiate between  SARS-CoV-2 and other Sarbecovirus currently known to infect humans.  If clinically indicated additional testing with an alternate test  methodology [phone]) is advised. The SARS-CoV-2 RNA is generally  detectable in upper and lower respiratory sp ecimens during the acute  phase of infection. The expected result is Negative. Fact Sheet for Patients:  StrictlyIdeas.no Fact Sheet for Healthcare Providers: BankingDealers.co.za This test is not yet approved or cleared by the [place] FDA and has been authorized for detection and/or diagnosis of SARS-CoV-2 by FDA under an Emergency Use Authorization (EUA).  This EUA will remain in effect (meaning this test can be used) for the duration of the COVID-19 declaration under Section 564(b)(1) of the Act, 21 U.S.C. section 360bbb-3(b)(1), unless the authorization is terminated or revoked sooner. Performed at [facility], [place] [address] [place], [place] [address]   Culture, blood (routine x 2)     Status: None (Preliminary result)   Collection Time: 06/10/19  5:28 PM   Specimen: Left Antecubital; Blood  Result Value Ref Range Status   Specimen Description   Final    LEFT ANTECUBITAL Performed at [facility] [address] [place], [place] [address]    Special Requests   Final    BOTTLES DRAWN AEROBIC AND ANAEROBIC Blood Culture adequate volume Performed at [facility] [address] [place], [place] [address]    Culture   Final    NO GROWTH < 12 HOURS Performed at [facility] [address] [place], [place] [address]    Report Status PENDING  Incomplete  Culture, blood (routine x 2)     Status: None (Preliminary result)   Collection Time: 06/10/19  5:33 PM   Specimen: BLOOD LEFT HAND  Result Value Ref Range Status   Specimen Description   Final    BLOOD LEFT HAND Performed at [facility] [address] [place], [place] [address]    Special Requests   Final    BOTTLES DRAWN AEROBIC AND ANAEROBIC Blood Culture adequate volume Performed at [facility] [address], [place], [place] [address]    Culture   Final  NO GROWTH < 12 HOURS Performed at [facility] [address] [place], [place] [address]    Report Status PENDING  Incomplete  MRSA PCR Screening     Status: None   Collection Time: 06/10/19 10:46 PM   Specimen: Nasal Mucosa; Nasopharyngeal  Result Value Ref Range Status   MRSA by PCR NEGATIVE NEGATIVE Final    Comment:        The GeneXpert MRSA Assay (FDA approved for NASAL specimens only), is one component of a comprehensive MRSA colonization surveillance program. It is not intended to diagnose MRSA infection nor to guide or monitor treatment for MRSA infections. Performed at [facility], [place] [address] [place], [place] [address]          Radiology Studies: Ct Angio Chest Pe W And/or Wo Contrast  Result Date: 06/10/2019 CLINICAL DATA:  Shortness of breath, fever. 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 EXAM: CT ANGIOGRAPHY CHEST WITH CONTRAST TECHNIQUE: Multidetector CT imaging of the chest was  performed using the standard protocol during bolus administration of intravenous contrast. Multiplanar CT image reconstructions and MIPs were obtained to evaluate the vascular anatomy. CONTRAST:  13mL OMNIPAQUE IOHEXOL 350 MG/ML SOLN COMPARISON:  Chest radiograph dated 06/10/2019. CTA chest dated 05/27/2019. FINDINGS: Motion degraded images. Cardiovascular: Satisfactory opacification of the bilateral pulmonary arteries to the segmental level. No evidence pulmonary embolism. No evidence of thoracic aortic aneurysm or dissection. Atherosclerotic calcifications of the aortic arch. The heart is normal in size.  No pericardial effusion. 3 vessel coronary atherosclerosis. Mediastinum/Nodes: No suspicious mediastinal lymphadenopathy. Suspected 18 mm right isthmic thyroid nodule (series 4/image 11), not well visualized on the prior study, although of questionable clinical significance in this patient. Lungs/Pleura: Multiple bilateral pulmonary metastases, unchanged from recent prior, although better evaluated on the current study due to reduced motion degradation. Index lesions are as follows: --21 mm right upper lobe nodule (series 6/image 39), previously measured as 19 mm --24 mm superior segment left lower lobe nodule (series 6/image 55), previously measured as 24 mm --27 mm central right lower lobe nodule (series 6/image 109), previously measured as 35 mm Extensive centrilobular and paraseptal emphysematous changes, upper lobe predominant. No focal consolidation. No pleural effusion or pneumothorax. Upper Abdomen: Embolization coils within the left hepatic lobe with adjacent incompletely visualized treated left hepatic lobe lesion (series 4/image 88). Additional poorly evaluated lesion in the central hepatic dome (series 4/image 78). Cholelithiasis. 6.5 cm right renal cyst. The superior aspect of the known large right abdominal mass is incompletely visualized (series 4/image 111). Musculoskeletal: Degenerative changes  of the visualized thoracolumbar spine. Review of the MIP images confirms the above findings. IMPRESSION: No evidence of pulmonary embolism. Bilateral pulmonary metastases, better evaluate than on the recent prior, likely grossly unchanged. Large right abdominal mass is incompletely visualized. Hepatic metastases, poorly evaluated. Additional ancillary findings as above. Aortic Atherosclerosis (ICD10-I70.0) and Emphysema (ICD10-J43.9). Electronically Signed   By: Julian Hy M.D.   On: 06/10/2019 21:10   Dg Chest Port 1 View  Result Date: 06/10/2019 CLINICAL DATA:  Shortness of breath, fever, history bladder cancer, emphysema, COPD, former smoker, coronary artery disease, hypertension EXAM: PORTABLE CHEST 1 VIEW COMPARISON:  Portable exam 1723 hours compared to 05/26/2019 and correlated with CT chest of 05/27/2019 FINDINGS: Normal heart size, mediastinal contours, and pulmonary vascularity. Atherosclerotic calcification aorta. Emphysematous and minimal bronchitic changes consistent with COPD. BILATERAL pulmonary nodules consistent with metastases. Bibasilar atelectasis versus fibrosis unchanged. No acute infiltrate, pleural  effusion, or pneumothorax. No acute osseous findings. IMPRESSION: Changes of COPD with bibasilar chronic interstitial lung disease/fibrosis versus atelectasis. BILATERAL pulmonary metastases. No acute abnormalities. Electronically Signed   By: Lavonia Dana M.D.   On: 06/10/2019 18:15        Scheduled Meds:  ALPRAZolam  0.5 mg Oral QHS   budesonide  0.25 mg Nebulization BID   chlorhexidine  15 mL Mouth Rinse BID   Chlorhexidine Gluconate Cloth  6 each Topical Daily   ipratropium-albuterol  3 mL Nebulization Q4H   mouth rinse  15 mL Mouth Rinse q12n4p   metoprolol tartrate  25 mg Oral BID   pantoprazole  40 mg Oral Daily   simvastatin  20 mg Oral q1800   sodium chloride flush  3 mL Intravenous Q12H   vitamin B-12  1,000 mcg Oral Daily   Continuous  Infusions:  ceFEPime (MAXIPIME) IV Stopped (06/11/19 0514)   [START ON 06/12/2019] vancomycin       LOS: 1 day    Time spent: [place], MD [facility]  If 7PM-7AM, please contact night-coverage  06/11/2019, 11:41 AM

## 2019-06-11 NOTE — Progress Notes (Signed)
CRITICAL VALUE ALERT  Critical Value: Hgb 6.4  Date & Time Notied:  5361 06/11/2019  Provider Notified: [place]   Orders Received/Actions taken: Awaiting orders

## 2019-06-12 LAB — BASIC METABOLIC PANEL
Anion gap: 10 (ref 5–15)
BUN: 24 mg/dL — ABNORMAL HIGH (ref 8–23)
CO2: 26 mmol/L (ref 22–32)
Calcium: 8.1 mg/dL — ABNORMAL LOW (ref 8.9–10.3)
Chloride: 95 mmol/L — ABNORMAL LOW (ref 98–111)
Creatinine, Ser: 0.84 mg/dL (ref 0.61–1.24)
GFR calc Af Amer: >60 mL/min (ref >60)
GFR calc non Af Amer: >60 mL/min (ref >60)
Glucose, Bld: 192 mg/dL — ABNORMAL HIGH (ref 70–99)
Potassium: 4.3 mmol/L (ref 3.5–5.1)
Sodium: 131 mmol/L — ABNORMAL LOW (ref 135–145)

## 2019-06-12 LAB — TYPE AND SCREEN
ABO/RH(D): A POS
Antibody Screen: NEGATIVE
Unit division: 0
Unit division: 0

## 2019-06-12 LAB — URINE CULTURE: Culture: <10000 — AB

## 2019-06-12 LAB — CBC WITH DIFFERENTIAL/PLATELET
Abs Immature Granulocytes: 0.15 10*3/uL — ABNORMAL HIGH (ref 0.00–0.07)
Basophils Absolute: 0 10*3/uL (ref 0.0–0.1)
Basophils Relative: 0 %
Eosinophils Absolute: 0 10*3/uL (ref 0.0–0.5)
Eosinophils Relative: 0 %
HCT: 28.2 % — ABNORMAL LOW (ref 39.0–52.0)
Hemoglobin: 9.2 g/dL — ABNORMAL LOW (ref 13.0–17.0)
Immature Granulocytes: 1 %
Lymphocytes Relative: 4 %
Lymphs Abs: 0.4 10*3/uL — ABNORMAL LOW (ref 0.7–4.0)
MCH: 30.6 pg (ref 26.0–34.0)
MCHC: 32.6 g/dL (ref 30.0–36.0)
MCV: 93.7 fL (ref 80.0–100.0)
Monocytes Absolute: 0.7 10*3/uL (ref 0.1–1.0)
Monocytes Relative: 7 %
Neutro Abs: 9.3 10*3/uL — ABNORMAL HIGH (ref 1.7–7.7)
Neutrophils Relative %: 88 %
Platelets: 135 10*3/uL — ABNORMAL LOW (ref 150–400)
RBC: 3.01 MIL/uL — ABNORMAL LOW (ref 4.22–5.81)
RDW: 17.1 % — ABNORMAL HIGH (ref 11.5–15.5)
WBC: 10.6 10*3/uL — ABNORMAL HIGH (ref 4.0–10.5)
nRBC: 0 % (ref 0.0–0.2)

## 2019-06-12 LAB — BPAM RBC
Blood Product Expiration Date: 202008132359
Blood Product Expiration Date: 202008192359
ISSUE DATE / TIME: 202007270816
ISSUE DATE / TIME: 202007271122
Unit Type and Rh: 6200
Unit Type and Rh: 6200

## 2019-06-12 MED ORDER — SODIUM CHLORIDE 0.9 % IV BOLUS
500.0000 mL | Freq: Once | INTRAVENOUS | Status: AC
  Administered 2019-06-12: 500 mL via INTRAVENOUS

## 2019-06-12 MED ORDER — SODIUM CHLORIDE 0.9% FLUSH
10.0000 mL | Freq: Two times a day (BID) | INTRAVENOUS | Status: DC
  Administered 2019-06-12 – 2019-06-18 (×9): 10 mL

## 2019-06-12 MED ORDER — METHOCARBAMOL 500 MG PO TABS
1000.0000 mg | ORAL_TABLET | Freq: Every day | ORAL | Status: DC | PRN
  Administered 2019-06-12 – 2019-06-18 (×5): 1000 mg via ORAL
  Filled 2019-06-12 (×5): qty 2

## 2019-06-12 MED ORDER — SODIUM CHLORIDE 0.9% FLUSH: 10.0000 mL | INTRAVENOUS | Status: DC | PRN

## 2019-06-12 NOTE — Progress Notes (Signed)
PROGRESS NOTE    [name]  DZH:[id number] DOB: 05-09-1946 DOA: 06/10/2019 PCP: [name], MD   Brief Narrative:  Per HP: [name] a 73 y.o.malewith medical history significant formetastatic bladder cancer, COPD with chronic respiratory failure on 6 L supplemental O2 via [place], CAD s/p PCI with DES, chronic diastolic CHF, hypertension, hyperlipidemia, anemia of chronic disease, anxiety, and OSA on BiPAP whopresents to the ED for evaluation of dyspnea.  Patient was recently admitted from 05/26/2019-06/02/2019 at which time he was treated for hypoxic respiratory failure secondary to COPD exacerbation. He was also found to have sepsis due to E. coli bacteremia felt secondary to necrotic bladder tumor.  Patient was feeling well until today when he developed new onset shortness of breath when he got up to go outside of his house. He had noticed some wheezing as well. He states that he felt his lungs were getting tight and he denies using home nebulizer treatment and time therefore called EMS. He denies any chest pain, palpitations, dysuria. He has abdominal pain from his bladder tumor. He reports swelling in both of his legs which he feels is stable.  Per ED documentation patient was on his home 6 L O2 via [place] with O2 saturation in the 80s on EMS arrival. He was placed on BiPAP with relief. When taken off BiPAP he again became very short of breath. He was also noted to have a temperature 102 Fahrenheit. He was subsequently brought to the ED for further evaluation.  Patient was seen by hospital service admitted to SDU and treated for acute on chronic respiratory failure with COPD exacerbation, sepsis with bacteremia gram-negative rods, and elevated troponins with no interventional recommendations by cardiology   Assessment & Plan:   Principal Problem:   Acute on chronic respiratory failure ([place]) Active Problems:   Malignant neoplasm of bladder ([place])   Anxiety  Essential hypertension   End stage COPD ([place])   OSA treated with BiPAP   Chronic diastolic CHF (congestive heart failure) (H[facility])   Anemia of chronic disease   Sepsis ([place])   Acute on chronic respiratory failuredue to exacerbation of COPD: As noted, symptoms appear primarily secondary to exacerbation of COPD with likely contribution from CHF and known pulmonary metastases. Respiratory status significantly improved with BiPAP initally and pt now on baseline 6L -continue BiPAP prn -Continue Pulmicort, duo nebs, and as needed albuterol -appears to have a strong component of underlying anxiety associated with SOB  Sepsis with GNR enterobacter bacteremia:: Patient with recent E. coli bacteremia now presenting with fever, leukocytosis, tachypnea, tachycardia. Urinalysis negative, however obtained after patient received antibiotics. -Initially treated with IV vancomycin now d/c'd - c/w cefepime for now, de-escalate as able - Follow blood cultures for sensitivities  Elevated troponin in setting of CADs/p PCI w/ DES in 2008: Suspect demand injury from respiratory failure and sepsis as above. Per EDP discussion with cardiology he is not aninterventional candidate. -Continue simvastatin and Lopressor -Not on aspirin/aggressive anticoagulation due to previous concern for GI bleeding  Acute on chronic diastolic CHF: Patient hypervolemic on admission. He uses Lasix as needed on an outpatient basis -Given IV Lasix 40 mg once in ER. 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 Status post radiation therapy, neurosurgery and chemotherapy per patient. Follows with palliative care.   Anemia of chronic disease: Hemoglobin 8.0 on admission. Recently required transfusion due to low  hemoglobin of 6.7 on 05/28/2019. -Repeat labs in a.m., consider transfusion if  hemoglobin declines, most recent  9.2  Hypertension: BP borderline low. Resume metoprolol tomorrow as BP allows.  Hyperlipidemia: Continue simvastatin.  Anxiety: Continue home Xanax as needed.  OSA: Continue BiPAP nightly  DVT prophylaxis: SCD/Compression stockings  Code Status: DNR    Code Status Orders  (From admission, onward)         Start     Ordered   06/10/19 2341  Do not attempt resuscitation (DNR)  Continuous    Question Answer Comment  In the event of cardiac or respiratory ARREST Do not call a code blue   In the event of cardiac or respiratory ARREST Do not perform Intubation, CPR, defibrillation or ACLS   In the event of cardiac or respiratory ARREST Use medication by any route, position, wound care, and other measures to relive pain and suffering. May use oxygen, suction and manual treatment of airway obstruction as needed for comfort.      06/10/19 2342        Code Status History    Date Active Date Inactive Code Status Order ID Comments User Context   05/27/2019 0354 06/02/2019 1757 DNR [id number]  Ivor Costa, MD Inpatient   05/27/2019 0229 05/27/2019 0354 DNR [id number]  Ivor Costa, MD ED   05/06/2019 1220 05/07/2019 1359 DNR [id number]  Elodia Florence., MD Inpatient   05/06/2019 0422 05/06/2019 1220 Full Code [id number]  Hugelmeyer, Wanda, DO Inpatient   04/23/2019 1510 04/24/2019 2219 DNR [id number]  Mendel Corning, MD Inpatient   03/27/2019 1738 03/30/2019 1830 DNR [id number]  Georgette Shell, MD ED   02/18/2019 1539 02/21/2019 2028 Partial Code [id number]  Kayleen Memos, DO ED   02/18/2019 1527 02/18/2019 1539 DNR [id number]  Kayleen Memos, DO ED   02/18/2019 1519 02/18/2019 1527 Full Code [id number]  Kayleen Memos, DO ED   12/14/2018 2025 12/15/2018 2042 DNR [id number]  Elmarie Shiley, MD Inpatient   06/07/2018 2032 06/12/2018 0045 Full Code [id number]  Etta Quill, DO ED   05/13/2018 2145 05/18/2018 2157 Full Code [id number]  Toy Baker, MD Inpatient     01/19/2017 1957 01/26/2017 1550 Full Code [id number]  Rama, Venetia Maxon, MD ED   09/24/2016 0246 09/29/2016 2000 Full Code [id number]  Vianne Bulls, MD ED   10/20/2015 1934 10/29/2015 1653 Full Code [id number]  Mendel Corning, MD Inpatient   Advance Care Planning Activity    Advance Directive Documentation     Most Recent Value  Type of Advance Directive  Living will  Pre-existing out of facility DNR order (yellow form or pink MOST form)  --  "MOST" Form in Place?  --     Family Communication: called wife, DISCUSSED PLAN OF CARE Disposition Plan:   Patient will remain inpatient for treatment of respiratory failure secondary to likely CHF exacerbation, bacteremia and pneumonia.  He will require IV antibiotics, IV diuresis, respiratory support and frequent nurse intervention.  Without these treatments patient at risk of severe life-threatening clinical deterioration Consults called: None Admission status: Inpatient   Consultants:   None  Procedures:  Ct Angio Chest Pe W And/or Wo Contrast  Result Date: 06/10/2019 CLINICAL DATA:  Shortness of breath, fever. 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 EXAM: CT ANGIOGRAPHY CHEST WITH CONTRAST TECHNIQUE: Multidetector CT imaging of the chest was performed using the standard protocol during bolus administration of intravenous contrast. Multiplanar CT image reconstructions and  MIPs were obtained to evaluate the vascular anatomy. CONTRAST:  118mL OMNIPAQUE IOHEXOL 350 MG/ML SOLN COMPARISON:  Chest radiograph dated 06/10/2019. CTA chest dated 05/27/2019. FINDINGS: Motion degraded images. Cardiovascular: Satisfactory opacification of the bilateral pulmonary arteries to the segmental level. No evidence pulmonary embolism. No evidence of thoracic aortic aneurysm or dissection. Atherosclerotic calcifications of the aortic arch. The heart is normal in size.  No pericardial effusion. 3 vessel coronary atherosclerosis.  Mediastinum/Nodes: No suspicious mediastinal lymphadenopathy. Suspected 18 mm right isthmic thyroid nodule (series 4/image 11), not well visualized on the prior study, although of questionable clinical significance in this patient. Lungs/Pleura: Multiple bilateral pulmonary metastases, unchanged from recent prior, although better evaluated on the current study due to reduced motion degradation. Index lesions are as follows: --21 mm right upper lobe nodule (series 6/image 39), previously measured as 19 mm --24 mm superior segment left lower lobe nodule (series 6/image 55), previously measured as 24 mm --27 mm central right lower lobe nodule (series 6/image 109), previously measured as 35 mm Extensive centrilobular and paraseptal emphysematous changes, upper lobe predominant. No focal consolidation. No pleural effusion or pneumothorax. Upper Abdomen: Embolization coils within the left hepatic lobe with adjacent incompletely visualized treated left hepatic lobe lesion (series 4/image 88). Additional poorly evaluated lesion in the central hepatic dome (series 4/image 78). Cholelithiasis. 6.5 cm right renal cyst. The superior aspect of the known large right abdominal mass is incompletely visualized (series 4/image 111). Musculoskeletal: Degenerative changes of the visualized thoracolumbar spine. Review of the MIP images confirms the above findings. IMPRESSION: No evidence of pulmonary embolism. Bilateral pulmonary metastases, better evaluate than on the recent prior, likely grossly unchanged. Large right abdominal mass is incompletely visualized. Hepatic metastases, poorly evaluated. Additional ancillary findings as above. Aortic Atherosclerosis (ICD10-I70.0) and Emphysema (ICD10-J43.9). Electronically Signed   By: Julian Hy M.D.   On: 06/10/2019 21:10   Ct Angio Chest Pe W And/or Wo Contrast  Result Date: 05/27/2019 CLINICAL DATA:  Shortness of breath, weakness, fever. History of abdominal spindle-cell  sarcoma with hepatic and pulmonary metastases. EXAM: CT ANGIOGRAPHY CHEST CT ABDOMEN AND PELVIS WITH CONTRAST TECHNIQUE: Multidetector CT imaging of the chest was performed using the standard protocol during bolus administration of intravenous contrast. Multiplanar CT image reconstructions and MIPs were obtained to evaluate the vascular anatomy. Multidetector CT imaging of the abdomen and pelvis was performed using the standard protocol during bolus administration of intravenous contrast. CONTRAST:  120mL OMNIPAQUE IOHEXOL 350 MG/ML SOLN COMPARISON:  03/27/2019. FINDINGS: CTA CHEST FINDINGS Cardiovascular: Motion degraded images. Within that constraint, there is satisfactory opacification of the bilateral pulmonary arteries to the lobar level. No evidence of pulmonary embolism. No evidence of thoracic aortic aneurysm. Atherosclerotic calcifications of the aortic arch. Heart is normal in size.  No pericardial effusion. Three vessel coronary atherosclerosis. Mediastinum/Nodes: No suspicious mediastinal lymphadenopathy. Visualized thyroid is unremarkable. Lungs/Pleura: Bilateral pulmonary nodules/metastases, motion degraded but favored to be mildly progressive, including: --1.9 cm right upper lobe nodule (series 12/image 53), previously 1.5 cm --2.4 cm superior segment left lower lobe nodule (series 12/image 53), previously 1.8 cm --3.5 cm central right lower lobe mass (series 12/image 109), previously 2.6 cm Extensive centrilobular and paraseptal emphysematous changes, upper lung predominant. No focal consolidation. No pleural effusion or pneumothorax. Musculoskeletal: Degenerative changes of the thoracic spine. Review of the MIP images confirms the above findings. CT ABDOMEN and PELVIS FINDINGS Motion degraded images. Hepatobiliary: Embolization coils within a branch of the distal left hepatic artery. Partially calcified treated left hepatic lesion  in segment 2 measures 5.2 x 3.7 cm, previously 4.8 x 3.7 cm, grossly  unchanged. 3.5 x 2.9 cm hypodense lesion in segment 4A (series 4/image 11), previously 3.8 x 2.8 cm, unchanged. Additional 14 mm lesion inferiorly in the right hepatic lobe (series 4/image 27), similar to the prior. Gallbladder is unremarkable. No intrahepatic or extrahepatic ductal dilatation. Pancreas: Within normal limits. Spleen: Within normal limits. Adrenals/Urinary Tract: Adrenal glands are within normal limits. 6.8 cm right upper pole renal cyst. Left kidney is within normal limits. No hydronephrosis. Bladder is underdistended but unremarkable. Stomach/Bowel: Stomach is within normal limits. No evidence of bowel obstruction. Normal appendix (series 4/image 46). Left colon is decompressed. Mild sigmoid diverticulosis, without evidence of diverticulitis. Vascular/Lymphatic: No evidence of abdominal aortic aneurysm. Atherosclerotic calcifications of the abdominal aorta and branch vessels. No suspicious abdominopelvic lymphadenopathy. Reproductive: Prostate is unremarkable. Other: 16.7 x 26.0 x 18.7 cm mass in the right mid abdomen, previously 16.0 x 24.0 x 18.5 cm, mildly increased (calculated volume = 4250 mL versus 3720 mL on the prior). Interval development of necrosis with gas along the left superior aspect of the mass (series 4/image 45). No abdominopelvic ascites. Musculoskeletal: Degenerative changes of the visualized thoracolumbar spine, most prominent at L5-S1. Review of the MIP images confirms the above findings. IMPRESSION: No evidence of pulmonary embolism. 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 Status post chemoembolization. Hepatic metastases are grossly unchanged. Progressive pulmonary metastases, as described above, although poorly evaluated due to motion degradation. Aortic Atherosclerosis (ICD10-I70.0) and Emphysema (ICD10-J43.9). Electronically Signed   By: Julian Hy M.D.   On: 05/27/2019 03:48   Ct Abdomen Pelvis W  Contrast  Result Date: 05/27/2019 CLINICAL DATA:  Shortness of breath, weakness, fever. 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 EXAM: CT ANGIOGRAPHY CHEST CT ABDOMEN AND PELVIS WITH CONTRAST TECHNIQUE: Multidetector CT imaging of the chest was performed using the standard protocol during bolus administration of intravenous contrast. Multiplanar CT image reconstructions and MIPs were obtained to evaluate the vascular anatomy. Multidetector CT imaging of the abdomen and pelvis was performed using the standard protocol during bolus administration of intravenous contrast. CONTRAST:  116mL OMNIPAQUE IOHEXOL 350 MG/ML SOLN COMPARISON:  03/27/2019. FINDINGS: CTA CHEST FINDINGS Cardiovascular: Motion degraded images. Within that constraint, there is satisfactory opacification of the bilateral pulmonary arteries to the lobar level. No evidence of pulmonary embolism. No evidence of thoracic aortic aneurysm. Atherosclerotic calcifications of the aortic arch. Heart is normal in size.  No pericardial effusion. Three vessel coronary atherosclerosis. Mediastinum/Nodes: No suspicious mediastinal lymphadenopathy. Visualized thyroid is unremarkable. Lungs/Pleura: Bilateral pulmonary nodules/metastases, motion degraded but favored to be mildly progressive, including: --1.9 cm right upper lobe nodule (series 12/image 53), previously 1.5 cm --2.4 cm superior segment left lower lobe nodule (series 12/image 53), previously 1.8 cm --3.5 cm central right lower lobe mass (series 12/image 109), previously 2.6 cm Extensive centrilobular and paraseptal emphysematous changes, upper lung predominant. No focal consolidation. No pleural effusion or pneumothorax. Musculoskeletal: Degenerative changes of the thoracic spine. Review of the MIP images confirms the above findings. CT ABDOMEN and PELVIS FINDINGS Motion degraded images. Hepatobiliary: Embolization coils within a branch of the distal left hepatic  artery. Partially calcified treated left hepatic lesion in segment 2 measures 5.2 x 3.7 cm, previously 4.8 x 3.7 cm, grossly unchanged. 3.5 x 2.9 cm hypodense lesion in segment 4A (series 4/image 11), previously 3.8 x 2.8 cm, unchanged. Additional 14 mm lesion inferiorly  in the right hepatic lobe (series 4/image 27), similar to the prior. Gallbladder is unremarkable. No intrahepatic or extrahepatic ductal dilatation. Pancreas: Within normal limits. Spleen: Within normal limits. Adrenals/Urinary Tract: Adrenal glands are within normal limits. 6.8 cm right upper pole renal cyst. Left kidney is within normal limits. No hydronephrosis. Bladder is underdistended but unremarkable. Stomach/Bowel: Stomach is within normal limits. No evidence of bowel obstruction. Normal appendix (series 4/image 46). Left colon is decompressed. Mild sigmoid diverticulosis, without evidence of diverticulitis. Vascular/Lymphatic: No evidence of abdominal aortic aneurysm. Atherosclerotic calcifications of the abdominal aorta and branch vessels. No suspicious abdominopelvic lymphadenopathy. Reproductive: Prostate is unremarkable. Other: 16.7 x 26.0 x 18.7 cm mass in the right mid abdomen, previously 16.0 x 24.0 x 18.5 cm, mildly increased (calculated volume = 4250 mL versus 3720 mL on the prior). Interval development of necrosis with gas along the left superior aspect of the mass (series 4/image 45). No abdominopelvic ascites. Musculoskeletal: Degenerative changes of the visualized thoracolumbar spine, most prominent at L5-S1. Review of the MIP images confirms the above findings. IMPRESSION: No evidence of pulmonary embolism. 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 Status post chemoembolization. Hepatic metastases are grossly unchanged. Progressive pulmonary metastases, as described above, although poorly evaluated due to motion degradation. Aortic Atherosclerosis (ICD10-I70.0) and  Emphysema (ICD10-J43.9). Electronically Signed   By: Julian Hy M.D.   On: 05/27/2019 03:48   Dg Chest Port 1 View  Result Date: 06/10/2019 CLINICAL DATA:  Shortness of breath, fever, history bladder cancer, emphysema, COPD, former smoker, coronary artery disease, hypertension EXAM: PORTABLE CHEST 1 VIEW COMPARISON:  Portable exam 1723 hours compared to 05/26/2019 and correlated with CT chest of 05/27/2019 FINDINGS: Normal heart size, mediastinal contours, and pulmonary vascularity. Atherosclerotic calcification aorta. Emphysematous and minimal bronchitic changes consistent with COPD. BILATERAL pulmonary nodules consistent with metastases. Bibasilar atelectasis versus fibrosis unchanged. No acute infiltrate, pleural effusion, or pneumothorax. No acute osseous findings. IMPRESSION: Changes of COPD with bibasilar chronic interstitial lung disease/fibrosis versus atelectasis. BILATERAL pulmonary metastases. No acute abnormalities. Electronically Signed   By: Lavonia Dana M.D.   On: 06/10/2019 18:15   Dg Chest Port 1 View  Result Date: 05/26/2019 CLINICAL DATA:  Shortness of breath and fever for several hours EXAM: PORTABLE CHEST 1 VIEW COMPARISON:  05/06/2019 FINDINGS: Cardiac shadow is stable. Aortic calcifications are seen. The lungs are well aerated bilaterally with emphysematous changes and crowding of the markings in the bases. Multiple bilateral pulmonary nodules are seen. Slight increased density in the bases is noted when compare with the prior exam likely representing some superimposed atelectatic change. IMPRESSION: Multifocal pulmonary nodules consistent with metastatic disease. Mild increased atelectasis in the bases superimposed over changes of COPD. Electronically Signed   By: Inez Catalina M.D.   On: 05/26/2019 23:46   Vas [place] Lower Extremity Venous (dvt)  Result Date: 05/28/2019  Lower Venous Study Indications: Edema. Other Indications: D dimer 3.3. Limitations: Poor ultrasound/tissue  interface. Comparison Study: no prior Performing Technologist: June Leap RDMS, RVT  Examination Guidelines: A complete evaluation includes B-mode imaging, spectral Doppler, color Doppler, and power Doppler as needed of all accessible portions of each vessel. Bilateral testing is considered an integral part of a complete examination. Limited examinations for reoccurring indications may be performed as noted.  +---------+---------------+---------+-----------+----------+--------------+  RIGHT     Compressibility Phasicity Spontaneity Properties Summary         +---------+---------------+---------+-----------+----------+--------------+  CFV       Full  Yes       Yes                                    +---------+---------------+---------+-----------+----------+--------------+  SFJ       Full                                                             +---------+---------------+---------+-----------+----------+--------------+  FV Prox   Full                                                             +---------+---------------+---------+-----------+----------+--------------+  FV Mid    Full                                                             +---------+---------------+---------+-----------+----------+--------------+  FV Distal Full                                                             +---------+---------------+---------+-----------+----------+--------------+  PFV       Full                                                             +---------+---------------+---------+-----------+----------+--------------+  POP       Full            Yes       Yes                                    +---------+---------------+---------+-----------+----------+--------------+  PTV       Full                                                             +---------+---------------+---------+-----------+----------+--------------+  PERO                                                       Not visualized   +---------+---------------+---------+-----------+----------+--------------+   +---------+---------------+---------+-----------+----------+-------+  LEFT      Compressibility Phasicity Spontaneity Properties Summary  +---------+---------------+---------+-----------+----------+-------+  CFV  Full            Yes       Yes                             +---------+---------------+---------+-----------+----------+-------+  SFJ       Full                                                      +---------+---------------+---------+-----------+----------+-------+  FV Prox   Full                                                      +---------+---------------+---------+-----------+----------+-------+  FV Mid    Full                                                      +---------+---------------+---------+-----------+----------+-------+  FV Distal Full                                                      +---------+---------------+---------+-----------+----------+-------+  PFV       Full                                                      +---------+---------------+---------+-----------+----------+-------+  POP       Full            Yes       Yes                             +---------+---------------+---------+-----------+----------+-------+  PTV       Full                                                      +---------+---------------+---------+-----------+----------+-------+  PERO      Full                                                      +---------+---------------+---------+-----------+----------+-------+     Summary: Right: There is no evidence of deep vein thrombosis in the lower extremity. No cystic structure found in the popliteal fossa. Left: There is no evidence of deep vein thrombosis in the lower extremity. No cystic structure found in the popliteal fossa.  *See table(s) above for measurements and observations. Electronically signed by Monica Martinez MD on 05/28/2019 at  5:05:23 PM.    Final       Antimicrobials:   Cefepime >7/26 day 3   Vancomycin received 2 days   Subjective: Patient is quite anxious at baseline although respiratory status is improved he is on 6 L home O2   Objective: Vitals:   06/12/19 0800 06/12/19 0851 06/12/19 0921 06/12/19 1000  BP: (!) 123/56  (!) 119/57 (!) 96/55  Pulse: 98  100 66  Resp: (!) 22   16  Temp: 100.2 F (37.9 C)     TempSrc: Oral     SpO2: 98% 97%  97%  Weight:      Height:        Intake/Output Summary (Last 24 hours) at 06/12/2019 1135 Last data filed at 06/12/2019 1100 Gross per 24 hour  Intake 1469.32 ml  Output 2575 ml  Net -1105.68 ml   Filed Weights   06/10/19 1726 06/11/19 0500 06/12/19 0500  Weight: 86.2 kg 87.2 kg 86.7 kg    Examination:  General exam: Appears anxious at baseline Respiratory system mild rales bilat, no wheezing, min rhonchi. Cardiovascular system: S1 & S2 heard, RRR. No JVD, murmurs, rubs, gallops or clicks. No pedal edema. Gastrointestinal system: Abdomen is nondistended, soft and nontender. No organomegaly or masses felt. Normal bowel sounds heard. Central nervous system: Alert and oriented. No focal neurological deficits. Extremities: Symmetric 5 x 5 strength. Skin: No rashes, lesions or ulcers Psychiatry: Judgement and insight appear normal. Anxious at baseline     Data Reviewed: I have personally reviewed following labs and imaging studies  CBC: Recent Labs  Lab 06/10/19 1904 06/11/19 0556 06/11/19 1749 06/12/19 0819  WBC 16.0* 7.5  --  10.6*  NEUTROABS 14.8*  --   --  9.3*  HGB 8.0* 6.4* 9.6* 9.2*  HCT 24.6* 20.8* 29.9* 28.2*  MCV 95.0 98.1  --  93.7  PLT 138* 128*  --  001*   Basic Metabolic Panel: Recent Labs  Lab 06/10/19 1727 06/11/19 0556 06/12/19 0819  NA 131* 132* 131*  K 4.4 4.7 4.3  CL 93* 97* 95*  CO2 27 25 26   GLUCOSE 208* 291* 192*  BUN 24* 24* 24*  CREATININE 0.94 0.79 0.84  CALCIUM 8.4* 7.8* 8.1*   GFR: Estimated Creatinine Clearance: 79.7  mL/min (by C-G formula based on SCr of 0.84 mg/dL). Liver Function Tests: No results for input(s): AST, ALT, ALKPHOS, BILITOT, PROT, ALBUMIN in the last 168 hours. No results for input(s): LIPASE, AMYLASE in the last 168 hours. No results for input(s): AMMONIA in the last 168 hours. Coagulation Profile: No results for input(s): INR, PROTIME in the last 168 hours. Cardiac Enzymes: No results for input(s): CKTOTAL, CKMB, CKMBINDEX, TROPONINI in the last 168 hours. BNP (last 3 results) No results for input(s): PROBNP in the last 8760 hours. HbA1C: No results for input(s): HGBA1C in the last 72 hours. CBG: No results for input(s): GLUCAP in the last 168 hours. Lipid Profile: No results for input(s): CHOL, HDL, LDLCALC, TRIG, CHOLHDL, LDLDIRECT in the last 72 hours. Thyroid Function Tests: No results for input(s): TSH, T4TOTAL, FREET4, T3FREE, THYROIDAB in the last 72 hours. Anemia Panel: No results for input(s): VITAMINB12, FOLATE, FERRITIN, TIBC, IRON, RETICCTPCT in the last 72 hours. Sepsis Labs: Recent Labs  Lab 06/10/19 1728 06/10/19 1928  LATICACIDVEN 3.1* 1.7    Recent Results (from the past 240 hour(s))  SARS Coronavirus 2 (CEPHEID- Performed in [facility] lab), Hosp Order     Status: None  Collection Time: 06/10/19  5:28 PM   Specimen: Nasopharyngeal Swab  Result Value Ref Range Status   SARS Coronavirus 2 NEGATIVE NEGATIVE Final    Comment: (NOTE) If result is NEGATIVE SARS-CoV-2 target nucleic acids are NOT DETECTED. The SARS-CoV-2 RNA is generally detectable in upper and lower  respiratory specimens during the acute phase of infection. The lowest  concentration of SARS-CoV-2 viral copies this assay can detect is 250  copies / mL. A negative result does not preclude SARS-CoV-2 infection  and should not be used as the sole basis for treatment or other  patient management decisions.  A negative result may occur with  improper specimen collection / handling,  submission of specimen other  than nasopharyngeal swab, presence of viral mutation(s) within the  areas targeted by this assay, and inadequate number of viral copies  (<250 copies / mL). A negative result must be combined with clinical  observations, patient history, and epidemiological information. If result is POSITIVE SARS-CoV-2 target nucleic acids are DETECTED. The SARS-CoV-2 RNA is generally detectable in upper and lower  respiratory specimens dur ing the acute phase of infection.  Positive  results are indicative of active infection with SARS-CoV-2.  Clinical  correlation with patient history and other diagnostic information is  necessary to determine patient infection status.  Positive results do  not rule out bacterial infection or co-infection with other viruses. If result is PRESUMPTIVE POSTIVE SARS-CoV-2 nucleic acids MAY BE PRESENT.   A presumptive positive result was obtained on the submitted specimen  and confirmed on repeat testing.  While 2019 novel coronavirus  (SARS-CoV-2) nucleic acids may be present in the submitted sample  additional confirmatory testing may be necessary for epidemiological  and / or clinical management purposes  to differentiate between  SARS-CoV-2 and other Sarbecovirus currently known to infect humans.  If clinically indicated additional testing with an alternate test  methodology [phone]) is advised. The SARS-CoV-2 RNA is generally  detectable in upper and lower respiratory sp ecimens during the acute  phase of infection. The expected result is Negative. Fact Sheet for Patients:  StrictlyIdeas.no Fact Sheet for Healthcare Providers: BankingDealers.co.za This test is not yet approved or cleared by the [place] FDA and has been authorized for detection and/or diagnosis of SARS-CoV-2 by FDA under an Emergency Use Authorization (EUA).  This EUA will remain in effect (meaning this test can be  used) for the duration of the COVID-19 declaration under Section 564(b)(1) of the Act, 21 U.S.C. section 360bbb-3(b)(1), unless the authorization is terminated or revoked sooner. Performed at [facility], [place] [address], [place], [place] [address]   Culture, blood (routine x 2)     Status: Abnormal (Preliminary result)   Collection Time: 06/10/19  5:28 PM   Specimen: Left Antecubital; Blood  Result Value Ref Range Status   Specimen Description   Final    LEFT ANTECUBITAL Performed at [facility] [address], [place], [place] [address]    Special Requests   Final    BOTTLES DRAWN AEROBIC AND ANAEROBIC Blood Culture adequate volume Performed at [facility] [address], [place], [place] [address]    Culture  Setup Time   Final    GRAM NEGATIVE RODS IN BOTH AEROBIC AND ANAEROBIC BOTTLES CRITICAL RESULT CALLED TO, READ BACK BY AND VERIFIED WITH: [facility] [phone] FCP    Culture (A)  Final    ENTEROBACTER CLOACAE SUSCEPTIBILITIES TO FOLLOW Performed at [facility], [place] [address] [place], [place] [address]    Report Status PENDING  Incomplete  Blood Culture ID Panel (Reflexed)     Status: Abnormal   Collection Time: 06/10/19  5:28 PM  Result Value Ref Range Status   Enterococcus species NOT DETECTED NOT DETECTED Final   Listeria monocytogenes NOT DETECTED NOT DETECTED Final   Staphylococcus species NOT DETECTED NOT DETECTED Final   Staphylococcus aureus (BCID) NOT DETECTED NOT DETECTED Final   Streptococcus species NOT DETECTED NOT DETECTED Final   Streptococcus agalactiae NOT DETECTED NOT DETECTED Final   Streptococcus pneumoniae NOT DETECTED NOT DETECTED Final   Streptococcus pyogenes NOT DETECTED NOT DETECTED Final   Acinetobacter baumannii NOT DETECTED NOT DETECTED Final   Enterobacteriaceae species DETECTED (A) NOT DETECTED Final    Comment: Enterobacteriaceae represent a large family of  gram-negative bacteria, not a single organism. CRITICAL RESULT CALLED TO, READ BACK BY AND VERIFIED WITH: [place] [phone] FCP    Enterobacter cloacae complex DETECTED (A) NOT DETECTED Final    Comment: CRITICAL RESULT CALLED TO, READ BACK BY AND VERIFIED WITH: [place] [phone] FCP    Escherichia coli NOT DETECTED NOT DETECTED Final   Klebsiella oxytoca NOT DETECTED NOT DETECTED Final   Klebsiella pneumoniae NOT DETECTED NOT DETECTED Final   Proteus species NOT DETECTED NOT DETECTED Final   Serratia marcescens NOT DETECTED NOT DETECTED Final   Carbapenem resistance NOT DETECTED NOT DETECTED Final   Haemophilus influenzae NOT DETECTED NOT DETECTED Final   Neisseria meningitidis NOT DETECTED NOT DETECTED Final   Pseudomonas aeruginosa NOT DETECTED NOT DETECTED Final   Candida albicans NOT DETECTED NOT DETECTED Final   Candida glabrata NOT DETECTED NOT DETECTED Final   Candida krusei NOT DETECTED NOT DETECTED Final   Candida parapsilosis NOT DETECTED NOT DETECTED Final   Candida tropicalis NOT DETECTED NOT DETECTED Final    Comment: Performed at [facility], [place] [address] [place], [place] [address]  Culture, blood (routine x 2)     Status: None (Preliminary result)   Collection Time: 06/10/19  5:33 PM   Specimen: BLOOD LEFT HAND  Result Value Ref Range Status   Specimen Description   Final    BLOOD LEFT HAND Performed at [facility] [address] [place], [place] [address]    Special Requests   Final    BOTTLES DRAWN AEROBIC AND ANAEROBIC Blood Culture adequate volume Performed at [facility] [address] [place], [place] [address]    Culture  Setup Time   Final    GRAM NEGATIVE RODS IN BOTH AEROBIC AND ANAEROBIC BOTTLES CRITICAL VALUE NOTED.  VALUE IS CONSISTENT WITH PREVIOUSLY REPORTED AND CALLED VALUE.    Culture   Final    GRAM NEGATIVE RODS SUSCEPTIBILITIES TO FOLLOW Performed at [facility], [place] [address] [place],  [address]    Report Status PENDING  Incomplete  MRSA PCR Screening     Status: None   Collection Time: 06/10/19 10:46 PM   Specimen: Nasal Mucosa; Nasopharyngeal  Result Value Ref Range Status   MRSA by PCR NEGATIVE NEGATIVE Final    Comment:        The GeneXpert MRSA Assay (FDA approved for NASAL specimens only), is one component of a comprehensive MRSA colonization surveillance program. It is not intended to diagnose MRSA infection nor to guide or monitor treatment for MRSA infections. Performed at [facility], [address] [place], [place] [address]   Urine culture     Status: Abnormal   Collection Time: 06/11/19 12:00 AM   Specimen: Urine, Random  Result Value Ref Range Status   Specimen Description   Final    URINE, RANDOM Performed at [facility] [address], [place], [place] [address]    Special Requests   Final    NONE Performed at [facility], [place] [address], [place], [place] [address]    Culture (A)  Final    <10,000 COLONIES/mL INSIGNIFICANT GROWTH Performed at [facility] [address] [place], [place] [address]    Report Status 06/12/2019 FINAL  Final         Radiology Studies: Ct Angio Chest Pe W And/or Wo Contrast  Result Date: 06/10/2019 CLINICAL DATA:  Shortness of breath, fever. 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 EXAM: CT ANGIOGRAPHY CHEST WITH CONTRAST TECHNIQUE: Multidetector CT imaging of the chest was performed using the standard protocol during bolus administration of intravenous contrast. Multiplanar CT image reconstructions and MIPs were obtained to evaluate the vascular anatomy. CONTRAST:  148mL OMNIPAQUE IOHEXOL 350 MG/ML SOLN COMPARISON:  Chest radiograph dated 06/10/2019. CTA chest dated 05/27/2019. FINDINGS: Motion degraded images. Cardiovascular: Satisfactory opacification of  the bilateral pulmonary arteries to the segmental level. No evidence pulmonary embolism. No evidence of thoracic aortic aneurysm or dissection. Atherosclerotic calcifications of the aortic arch. The heart is normal in size.  No pericardial effusion. 3 vessel coronary atherosclerosis. Mediastinum/Nodes: No suspicious mediastinal lymphadenopathy. Suspected 18 mm right isthmic thyroid nodule (series 4/image 11), not well visualized on the prior study, although of questionable clinical significance in this patient. Lungs/Pleura: Multiple bilateral pulmonary metastases, unchanged from recent prior, although better evaluated on the current study due to reduced motion degradation. Index lesions are as follows: --21 mm right upper lobe nodule (series 6/image 39), previously measured as 19 mm --24 mm superior segment left lower lobe nodule (series 6/image 55), previously measured as 24 mm --27 mm central right lower lobe nodule (series 6/image 109), previously measured as 35 mm Extensive centrilobular and paraseptal emphysematous changes, upper lobe predominant. No focal consolidation. No pleural effusion or pneumothorax. Upper Abdomen: Embolization coils within the left hepatic lobe with adjacent incompletely visualized treated left hepatic lobe lesion (series 4/image 88). Additional poorly evaluated lesion in the central hepatic dome (series 4/image 78). Cholelithiasis. 6.5 cm right renal cyst. The superior aspect of the known large right abdominal mass is incompletely visualized (series 4/image 111). Musculoskeletal: Degenerative changes of the visualized thoracolumbar spine. Review of the MIP images confirms the above findings. IMPRESSION: No evidence of pulmonary embolism. Bilateral pulmonary metastases, better evaluate than on the recent prior, likely grossly unchanged. Large right abdominal mass is incompletely visualized. Hepatic metastases, poorly evaluated. Additional ancillary findings as above. Aortic  Atherosclerosis (ICD10-I70.0) and Emphysema (ICD10-J43.9). Electronically Signed   By: Julian Hy M.D.   On: 06/10/2019 21:10   Dg Chest Port 1 View  Result Date: 06/10/2019 CLINICAL DATA:  Shortness of breath, fever, history bladder cancer, emphysema, COPD, former smoker, coronary artery disease, hypertension EXAM: PORTABLE CHEST 1 VIEW COMPARISON:  Portable exam 1723 hours compared to 05/26/2019 and correlated with CT chest of 05/27/2019 FINDINGS: Normal heart size, mediastinal contours, and pulmonary vascularity. Atherosclerotic calcification aorta. Emphysematous and minimal bronchitic changes consistent with COPD. BILATERAL pulmonary nodules consistent with metastases. Bibasilar atelectasis versus fibrosis unchanged. No acute infiltrate, pleural effusion, or pneumothorax. No acute  osseous findings. IMPRESSION: Changes of COPD with bibasilar chronic interstitial lung disease/fibrosis versus atelectasis. BILATERAL pulmonary metastases. No acute abnormalities. Electronically Signed   By: Lavonia Dana M.D.   On: 06/10/2019 18:15        Scheduled Meds:  ALPRAZolam  0.5 mg Oral QHS   budesonide  0.25 mg Nebulization BID   chlorhexidine  15 mL Mouth Rinse BID   Chlorhexidine Gluconate Cloth  6 each Topical Daily   furosemide  20 mg Intravenous BID   ipratropium-albuterol  3 mL Nebulization Q4H   mouth rinse  15 mL Mouth Rinse q12n4p   metoprolol tartrate  25 mg Oral BID   pantoprazole  40 mg Oral Daily   simvastatin  20 mg Oral q1800   sodium chloride flush  3 mL Intravenous Q12H   vitamin B-12  1,000 mcg Oral Daily   Continuous Infusions:  ceFEPime (MAXIPIME) IV Stopped (06/12/19 0640)     LOS: 2 days    Time spent: 35 min    Nicolette Bang, MD [facility]  If 7PM-7AM, please contact night-coverage  06/12/2019, 11:35 AM

## 2019-06-13 ENCOUNTER — Inpatient Hospital Stay (HOSPITAL_COMMUNITY): Payer: Medicare Other

## 2019-06-13 DIAGNOSIS — J9621 Acute and chronic respiratory failure with hypoxia: Secondary | ICD-10-CM

## 2019-06-13 LAB — BASIC METABOLIC PANEL
Anion gap: 10 (ref 5–15)
BUN: 24 mg/dL — ABNORMAL HIGH (ref 8–23)
CO2: 26 mmol/L (ref 22–32)
Calcium: 7.8 mg/dL — ABNORMAL LOW (ref 8.9–10.3)
Chloride: 94 mmol/L — ABNORMAL LOW (ref 98–111)
Creatinine, Ser: 0.94 mg/dL (ref 0.61–1.24)
GFR calc Af Amer: >60 mL/min (ref >60)
GFR calc non Af Amer: >60 mL/min (ref >60)
Glucose, Bld: 175 mg/dL — ABNORMAL HIGH (ref 70–99)
Potassium: 3.9 mmol/L (ref 3.5–5.1)
Sodium: 130 mmol/L — ABNORMAL LOW (ref 135–145)

## 2019-06-13 LAB — CBC WITH DIFFERENTIAL/PLATELET
Abs Immature Granulocytes: 0.09 K/uL — ABNORMAL HIGH (ref 0.00–0.07)
Basophils Absolute: 0 K/uL (ref 0.0–0.1)
Basophils Relative: 0 %
Eosinophils Absolute: 0 K/uL (ref 0.0–0.5)
Eosinophils Relative: 0 %
HCT: 25.9 % — ABNORMAL LOW (ref 39.0–52.0)
Hemoglobin: 8.2 g/dL — ABNORMAL LOW (ref 13.0–17.0)
Immature Granulocytes: 1 %
Lymphocytes Relative: 6 %
Lymphs Abs: 0.6 K/uL — ABNORMAL LOW (ref 0.7–4.0)
MCH: 29.7 pg (ref 26.0–34.0)
MCHC: 31.7 g/dL (ref 30.0–36.0)
MCV: 93.8 fL (ref 80.0–100.0)
Monocytes Absolute: 0.7 K/uL (ref 0.1–1.0)
Monocytes Relative: 6 %
Neutro Abs: 9.8 K/uL — ABNORMAL HIGH (ref 1.7–7.7)
Neutrophils Relative %: 87 %
Platelets: 121 K/uL — ABNORMAL LOW (ref 150–400)
RBC: 2.76 MIL/uL — ABNORMAL LOW (ref 4.22–5.81)
RDW: 17.1 % — ABNORMAL HIGH (ref 11.5–15.5)
WBC: 11.3 K/uL — ABNORMAL HIGH (ref 4.0–10.5)
nRBC: 0 % (ref 0.0–0.2)

## 2019-06-13 LAB — CULTURE, BLOOD (ROUTINE X 2)
Special Requests: ADEQUATE
Special Requests: ADEQUATE

## 2019-06-13 NOTE — Progress Notes (Signed)
PROGRESS NOTE    [name]  KYH:[id number] DOB: 1945-11-23 DOA: 06/10/2019 PCP: [name], MD     Brief Narrative:  [name] a 73 y.o.malewith medical history significant formetastatic bladder cancer, COPD with chronic respiratory failure on 6 L supplemental O2 via [place], CAD s/p PCI with DES, chronic diastolic CHF, hypertension, hyperlipidemia, anemia of chronic disease, anxiety, and OSA on BiPAP whopresents to the ED for evaluation of dyspnea.  Patient was recently admitted from 05/26/2019-06/02/2019 at which time he was treated for hypoxic respiratory failure secondary to COPD exacerbation. He was also found to have sepsis due to E. coli bacteremia felt secondary to necrotic bladder tumor.  Patient was feeling well until today when he developed new onset shortness of breath when he got up to go outside of his house. He had noticed some wheezing as well. He states that he felt his lungs were getting tight and he denies using home nebulizer treatment and time therefore called EMS. He denies any chest pain, palpitations, dysuria. He has abdominal pain from his bladder tumor. He reports swelling in both of his legs which he feels is stable.  Per ED documentation patient was on his home 6 L O2 via [place] with O2 saturation in the 80s on EMS arrival. He was placed on BiPAP with relief. When taken off BiPAP he again became very short of breath. He was also noted to have a temperature 102 Fahrenheit. He was subsequently brought to the ED for further evaluation.  Patient was seen by hospital service admitted to SDU and treated for acute on chronic respiratory failure with COPD exacerbation, sepsis with bacteremia gram-negative rods, and elevated troponins with no interventional recommendations by cardiology.   New events last 24 hours / Subjective: Patient back down to his baseline level of 6 L nasal cannula O2.  Denies any complaints of chest pain or worsening shortness  of breath.  Does feel like his legs are more swollen than his baseline.  Worried about his blood count  Assessment & Plan:   Principal Problem:   Acute on chronic respiratory failure (HCC) Active Problems:   Malignant neoplasm of bladder (HCC)   Anxiety   Essential hypertension   End stage COPD (HCC)   OSA treated with BiPAP   Chronic diastolic CHF (congestive heart failure) (HCC)   Anemia of chronic disease   Sepsis ([place])   Acute on chronic respiratory failuredue to exacerbation of COPD -Off BiPAP this morning, back on his baseline 6 L nasal cannula O2  Sepsis secondary to Enterobacter bacteremia -Recent E. coli bacteremia now presenting with fever, leukocytosis, tachypnea, tachycardia. Urinalysis negative, however obtained after patient received antibiotics -Blood culture sensitive to cefepime  Elevated troponin in setting of CADs/p PCI w/ DES in 2008 -Suspect demand injury from respiratory failure and sepsis as above. Per EDP discussion with cardiology he is not aninterventional candidate. 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 Status post radiation therapy, neurosurgery and chemotherapy per patient. Follows with palliative care.   Anemia of chronic disease -Hemoglobin 8.0 on admission. Recently required transfusion due to low hemoglobin of 6.7 on 05/28/2019. -Monitor CBC, hemoglobin 8.2 today  Hypertension -Metoprolol  Hyperlipidemia -Continue simvastatin  Anxiety -Continue home Xanax as needed  OSA -Continue BiPAP nightly  DVT prophylaxis: SCD Code Status: DNR Family Communication: None Disposition Plan: Pending further stabilization of his  respiratory efforts, hemoglobin, possible discharge home 7/30   Consultants:   None  Procedures:   None  Antimicrobials:  Anti-infectives (From admission, onward)   Start     Dose/Rate Route Frequency Ordered Stop   06/12/19 0300  vancomycin (VANCOCIN) 1,250 mg in sodium chloride 0.9 % 250 mL IVPB  Status:  Discontinued     1,250 mg 166.7 mL/hr over 90 Minutes Intravenous Daily 06/11/19 0020 06/11/19 1605   06/11/19 0400  ceFEPIme (MAXIPIME) 2 g in sodium chloride 0.9 % 100 mL IVPB     2 g 200 mL/hr over 30 Minutes Intravenous Every 8 hours 06/11/19 0020     06/11/19 0015  vancomycin (VANCOCIN) 1,750 mg in sodium chloride 0.9 % 500 mL IVPB     1,750 mg 250 mL/hr over 120 Minutes Intravenous  Once 06/11/19 0008 06/11/19 0223   06/10/19 2015  metroNIDAZOLE (FLAGYL) IVPB 500 mg     500 mg 100 mL/hr over 60 Minutes Intravenous  Once 06/10/19 2005 06/10/19 2224   06/10/19 2015  vancomycin (VANCOCIN) IVPB 1000 mg/200 mL premix  Status:  Discontinued     1,000 mg 200 mL/hr over 60 Minutes Intravenous  Once 06/10/19 2005 06/11/19 0008   06/10/19 2015  ceFEPIme (MAXIPIME) 2 g in sodium chloride 0.9 % 100 mL IVPB     2 g 200 mL/hr over 30 Minutes Intravenous  Once 06/10/19 2005 06/10/19 2049        Objective: Vitals:   06/13/19 0500 06/13/19 0800 06/13/19 0822 06/13/19 0900  BP:  (!) 102/50  (!) 135/57  Pulse:  100  (!) 52  Resp:  (!) 22  20  Temp:  98.8 F (37.1 C)    TempSrc:  Oral    SpO2:  99% 98% 98%  Weight: 88.7 kg     Height:        Intake/Output Summary (Last 24 hours) at 06/13/2019 1105 Last data filed at 06/13/2019 0942 Gross per 24 hour  Intake 1341.09 ml  Output 2600 ml  Net -1258.91 ml   Filed Weights   06/11/19 0500 06/12/19 0500 06/13/19 0500  Weight: 87.2 kg 86.7 kg 88.7 kg    Examination:  General exam: Appears calm and comfortable  Respiratory system: Clear to auscultation. Respiratory effort normal.  On 6 L nasal cannula O2 without  conversational dyspnea or distress Cardiovascular system: S1 & S2 heard, irregular rhythm, PACs on telemetry, bilateral pitting pedal edema Gastrointestinal system: Abdomen is nondistended, soft and nontender. No organomegaly or masses felt. Normal bowel sounds heard. Central nervous system: Alert and oriented. No focal neurological deficits. Extremities: Symmetric 5 x 5 power. Skin: No rashes, lesions or ulcers Psychiatry: Judgement and insight appear normal. Mood & affect appropriate.   Data Reviewed: I have personally reviewed following labs and imaging studies  CBC: Recent Labs  Lab 06/10/19 1904 06/11/19 0556 06/11/19 1749 06/12/19 0819 06/13/19 0251  WBC 16.0* 7.5  --  10.6* 11.3*  NEUTROABS 14.8*  --   --  9.3* 9.8*  HGB 8.0* 6.4* 9.6* 9.2* 8.2*  HCT 24.6* 20.8* 29.9* 28.2* 25.9*  MCV 95.0 98.1  --  93.7 93.8  PLT 138* 128*  --  135* 962*   Basic Metabolic Panel: Recent Labs  Lab 06/10/19 1727 06/11/19 0556 06/12/19 0819 06/13/19 0251  NA 131* 132* 131* 130*  K 4.4 4.7 4.3 3.9  CL 93* 97* 95*  94*  CO2 27 25 26 26   GLUCOSE 208* 291* 192* 175*  BUN 24* 24* 24* 24*  CREATININE 0.94 0.79 0.84 0.94  CALCIUM 8.4* 7.8* 8.1* 7.8*   GFR: Estimated Creatinine Clearance: 72 mL/min (by C-G formula based on SCr of 0.94 mg/dL). Liver Function Tests: No results for input(s): AST, ALT, ALKPHOS, BILITOT, PROT, ALBUMIN in the last 168 hours. No results for input(s): LIPASE, AMYLASE in the last 168 hours. No results for input(s): AMMONIA in the last 168 hours. Coagulation Profile: No results for input(s): INR, PROTIME in the last 168 hours. Cardiac Enzymes: No results for input(s): CKTOTAL, CKMB, CKMBINDEX, TROPONINI in the last 168 hours. BNP (last 3 results) No results for input(s): PROBNP in the last 8760 hours. HbA1C: No results for input(s): HGBA1C in the last 72 hours. CBG: No results for input(s): GLUCAP in the last 168 hours. Lipid Profile: No results for  input(s): CHOL, HDL, LDLCALC, TRIG, CHOLHDL, LDLDIRECT in the last 72 hours. Thyroid Function Tests: No results for input(s): TSH, T4TOTAL, FREET4, T3FREE, THYROIDAB in the last 72 hours. Anemia Panel: No results for input(s): VITAMINB12, FOLATE, FERRITIN, TIBC, IRON, RETICCTPCT in the last 72 hours. Sepsis Labs: Recent Labs  Lab 06/10/19 1728 06/10/19 1928  LATICACIDVEN 3.1* 1.7    Recent Results (from the past 240 hour(s))  SARS Coronavirus 2 (CEPHEID- Performed in [facility] hospital lab), Hosp Order     Status: None   Collection Time: 06/10/19  5:28 PM   Specimen: Nasopharyngeal Swab  Result Value Ref Range Status   SARS Coronavirus 2 NEGATIVE NEGATIVE Final    Comment: (NOTE) If result is NEGATIVE SARS-CoV-2 target nucleic acids are NOT DETECTED. The SARS-CoV-2 RNA is generally detectable in upper and lower  respiratory specimens during the acute phase of infection. The lowest  concentration of SARS-CoV-2 viral copies this assay can detect is 250  copies / mL. A negative result does not preclude SARS-CoV-2 infection  and should not be used as the sole basis for treatment or other  patient management decisions.  A negative result may occur with  improper specimen collection / handling, submission of specimen other  than nasopharyngeal swab, presence of viral mutation(s) within the  areas targeted by this assay, and inadequate number of viral copies  (<250 copies / mL). A negative result must be combined with clinical  observations, patient history, and epidemiological information. If result is POSITIVE SARS-CoV-2 target nucleic acids are DETECTED. The SARS-CoV-2 RNA is generally detectable in upper and lower  respiratory specimens dur ing the acute phase of infection.  Positive  results are indicative of active infection with SARS-CoV-2.  Clinical  correlation with patient history and other diagnostic information is  necessary to determine patient infection status.   Positive results do  not rule out bacterial infection or co-infection with other viruses. If result is PRESUMPTIVE POSTIVE SARS-CoV-2 nucleic acids MAY BE PRESENT.   A presumptive positive result was obtained on the submitted specimen  and confirmed on repeat testing.  While 2019 novel coronavirus  (SARS-CoV-2) nucleic acids may be present in the submitted sample  additional confirmatory testing may be necessary for epidemiological  and / or clinical management purposes  to differentiate between  SARS-CoV-2 and other Sarbecovirus currently known to infect humans.  If clinically indicated additional testing with an alternate test  methodology [phone]) is advised. The SARS-CoV-2 RNA is generally  detectable in upper and lower respiratory sp ecimens during the acute  phase of infection. The expected  result is Negative. Fact Sheet for Patients:  StrictlyIdeas.no Fact Sheet for Healthcare Providers: BankingDealers.co.za This test is not yet approved or cleared by the [place] FDA and has been authorized for detection and/or diagnosis of SARS-CoV-2 by FDA under an Emergency Use Authorization (EUA).  This EUA will remain in effect (meaning this test can be used) for the duration of the COVID-19 declaration under Section 564(b)(1) of the Act, 21 U.S.C. section 360bbb-3(b)(1), unless the authorization is terminated or revoked sooner. Performed at [facility], [place] [address] [place], [place] [address]   Culture, blood (routine x 2)     Status: Abnormal   Collection Time: 06/10/19  5:28 PM   Specimen: Left Antecubital; Blood  Result Value Ref Range Status   Specimen Description   Final    LEFT ANTECUBITAL Performed at [facility] [address] [place], [place] [address]    Special Requests   Final    BOTTLES DRAWN AEROBIC AND ANAEROBIC Blood Culture adequate volume Performed at [facility] [address] [place], [place] [address]    Culture  Setup Time   Final    GRAM NEGATIVE RODS IN BOTH AEROBIC AND ANAEROBIC BOTTLES CRITICAL RESULT CALLED TO, READ BACK BY AND VERIFIED WITH: [place] [id number] FCP Performed at [facility], [place] [address], [place], [place] [address]    Culture ENTEROBACTER CLOACAE (A)  Final   Report Status 06/13/2019 FINAL  Final   Organism ID, Bacteria ENTEROBACTER CLOACAE  Final      Susceptibility   Enterobacter cloacae - MIC*    CEFAZOLIN >=64 RESISTANT Resistant     CEFEPIME 2 SENSITIVE Sensitive     CEFTAZIDIME >=64 RESISTANT Resistant     CEFTRIAXONE >=64 RESISTANT Resistant     CIPROFLOXACIN <=0.25 SENSITIVE Sensitive     GENTAMICIN <=1 SENSITIVE Sensitive     IMIPENEM <=0.25 SENSITIVE Sensitive     TRIMETH/SULFA <=20 SENSITIVE Sensitive     PIP/TAZO >=128 RESISTANT Resistant     * ENTEROBACTER CLOACAE  Blood Culture ID Panel (Reflexed)     Status: Abnormal   Collection Time: 06/10/19  5:28 PM  Result Value Ref Range Status   Enterococcus species NOT DETECTED NOT DETECTED Final   Listeria monocytogenes NOT DETECTED NOT DETECTED Final   Staphylococcus species NOT DETECTED NOT DETECTED Final   Staphylococcus aureus (BCID) NOT DETECTED NOT DETECTED Final   Streptococcus species NOT DETECTED NOT DETECTED Final   Streptococcus agalactiae NOT DETECTED NOT DETECTED Final   Streptococcus pneumoniae NOT DETECTED NOT DETECTED Final   Streptococcus pyogenes NOT DETECTED NOT DETECTED Final   Acinetobacter baumannii NOT DETECTED NOT DETECTED Final   Enterobacteriaceae species DETECTED (A) NOT DETECTED Final    Comment: Enterobacteriaceae represent a large family of gram-negative bacteria, not a single organism. CRITICAL RESULT CALLED TO, READ BACK BY AND VERIFIED WITH: [place] [phone] FCP    Enterobacter cloacae complex DETECTED (A) NOT DETECTED Final    Comment: CRITICAL RESULT CALLED TO,  READ BACK BY AND VERIFIED WITH: [place] [id number] FCP    Escherichia coli NOT DETECTED NOT DETECTED Final   Klebsiella oxytoca NOT DETECTED NOT DETECTED Final   Klebsiella pneumoniae NOT DETECTED NOT DETECTED Final   Proteus species NOT DETECTED NOT DETECTED Final   Serratia marcescens NOT DETECTED NOT DETECTED Final   Carbapenem resistance NOT DETECTED NOT DETECTED Final   Haemophilus influenzae NOT DETECTED NOT DETECTED  Final   Neisseria meningitidis NOT DETECTED NOT DETECTED Final   Pseudomonas aeruginosa NOT DETECTED NOT DETECTED Final   Candida albicans NOT DETECTED NOT DETECTED Final   Candida glabrata NOT DETECTED NOT DETECTED Final   Candida krusei NOT DETECTED NOT DETECTED Final   Candida parapsilosis NOT DETECTED NOT DETECTED Final   Candida tropicalis NOT DETECTED NOT DETECTED Final    Comment: Performed at [facility], [place] [address], [place], [place] [address]  Culture, blood (routine x 2)     Status: Abnormal   Collection Time: 06/10/19  5:33 PM   Specimen: BLOOD LEFT HAND  Result Value Ref Range Status   Specimen Description   Final    BLOOD LEFT HAND Performed at [facility] [address] [place], [place] [address]    Special Requests   Final    BOTTLES DRAWN AEROBIC AND ANAEROBIC Blood Culture adequate volume Performed at [facility] [address] [place], [place] [address]    Culture  Setup Time   Final    GRAM NEGATIVE RODS IN BOTH AEROBIC AND ANAEROBIC BOTTLES CRITICAL VALUE NOTED.  VALUE IS CONSISTENT WITH PREVIOUSLY REPORTED AND CALLED VALUE.    Culture (A)  Final    ENTEROBACTER CLOACAE SUSCEPTIBILITIES PERFORMED ON PREVIOUS CULTURE WITHIN THE LAST 5 DAYS. Performed at [facility], [place] [address] [place], [place] [address]    Report Status 06/13/2019 FINAL  Final  MRSA PCR Screening     Status: None   Collection Time: 06/10/19 10:46 PM   Specimen: Nasal Mucosa; Nasopharyngeal   Result Value Ref Range Status   MRSA by PCR NEGATIVE NEGATIVE Final    Comment:        The GeneXpert MRSA Assay (FDA approved for NASAL specimens only), is one component of a comprehensive MRSA colonization surveillance program. It is not intended to diagnose MRSA infection nor to guide or monitor treatment for MRSA infections. Performed at [facility], [place] [address] [place], [place] [address]   Urine culture     Status: Abnormal   Collection Time: 06/11/19 12:00 AM   Specimen: Urine, Random  Result Value Ref Range Status   Specimen Description   Final    URINE, RANDOM Performed at [facility] [address], [place], [place] [address]    Special Requests   Final    NONE Performed at [facility], [place] [address] [place], [place] [address]    Culture (A)  Final    <10,000 COLONIES/mL INSIGNIFICANT GROWTH Performed at [facility] [address] [name], [place] [address]    Report Status 06/12/2019 FINAL  Final      Radiology Studies: Dg Chest Port 1 View  Result Date: 06/13/2019 CLINICAL DATA:  Shortness of breath and fever. History of metastatic sarcoma. EXAM: PORTABLE CHEST 1 VIEW COMPARISON:  Multiple previous recent chest x-rays and chest CTs. FINDINGS: The cardiac silhouette, mediastinal and hilar contours are within normal limits and stable. Stable advanced emphysematous changes and pulmonary scarring. Stable pulmonary metastatic lesions. Stable basilar compressive atelectasis and vascular crowding. No pleural effusions. IMPRESSION: Stable emphysematous changes, pulmonary scarring and metastatic pulmonary nodules. No acute overlying pulmonary process. Electronically Signed   By: Marijo Sanes M.D.   On: 06/13/2019 08:13      Scheduled Meds: . ALPRAZolam  0.5 mg Oral QHS  . budesonide  0.25 mg Nebulization BID  . chlorhexidine  15 mL Mouth  Rinse BID  . Chlorhexidine Gluconate Cloth  6 each  Topical Daily  . furosemide  20 mg Intravenous BID  . ipratropium-albuterol  3 mL Nebulization Q4H  . mouth rinse  15 mL Mouth Rinse q12n4p  . metoprolol tartrate  25 mg Oral BID  . pantoprazole  40 mg Oral Daily  . simvastatin  20 mg Oral q1800  . sodium chloride flush  10-40 mL Intracatheter Q12H  . sodium chloride flush  3 mL Intravenous Q12H  . vitamin B-12  1,000 mcg Oral Daily   Continuous Infusions: . ceFEPime (MAXIPIME) IV Stopped (06/13/19 0910)     LOS: 3 days      Time spent: 30 minutes   Dessa Phi, DO [facility] [email or web address] 06/13/2019, 11:05 AM

## 2019-06-14 LAB — BASIC METABOLIC PANEL
Anion gap: 9 (ref 5–15)
BUN: 23 mg/dL (ref 8–23)
CO2: 27 mmol/L (ref 22–32)
Calcium: 7.9 mg/dL — ABNORMAL LOW (ref 8.9–10.3)
Chloride: 94 mmol/L — ABNORMAL LOW (ref 98–111)
Creatinine, Ser: 0.94 mg/dL (ref 0.61–1.24)
GFR calc Af Amer: >60 mL/min (ref >60)
GFR calc non Af Amer: >60 mL/min (ref >60)
Glucose, Bld: 197 mg/dL — ABNORMAL HIGH (ref 70–99)
Potassium: 3.7 mmol/L (ref 3.5–5.1)
Sodium: 130 mmol/L — ABNORMAL LOW (ref 135–145)

## 2019-06-14 LAB — CBC
HCT: 23.5 % — ABNORMAL LOW (ref 39.0–52.0)
Hemoglobin: 7.7 g/dL — ABNORMAL LOW (ref 13.0–17.0)
MCH: 30.9 pg (ref 26.0–34.0)
MCHC: 32.8 g/dL (ref 30.0–36.0)
MCV: 94.4 fL (ref 80.0–100.0)
Platelets: 117 10*3/uL — ABNORMAL LOW (ref 150–400)
RBC: 2.49 MIL/uL — ABNORMAL LOW (ref 4.22–5.81)
RDW: 16.8 % — ABNORMAL HIGH (ref 11.5–15.5)
WBC: 9.7 10*3/uL (ref 4.0–10.5)
nRBC: 0 % (ref 0.0–0.2)

## 2019-06-14 LAB — HEMOGLOBIN AND HEMATOCRIT, BLOOD
HCT: 26.6 % — ABNORMAL LOW (ref 39.0–52.0)
Hemoglobin: 8.7 g/dL — ABNORMAL LOW (ref 13.0–17.0)

## 2019-06-14 LAB — PREPARE RBC (CROSSMATCH)

## 2019-06-14 MED ORDER — SODIUM CHLORIDE 0.9% IV SOLUTION
Freq: Once | INTRAVENOUS | Status: AC
  Administered 2019-06-14: 11:00:00 via INTRAVENOUS

## 2019-06-14 MED ORDER — SENNOSIDES-DOCUSATE SODIUM 8.6-50 MG PO TABS
1.0000 | ORAL_TABLET | Freq: Every evening | ORAL | Status: DC | PRN
  Administered 2019-06-14 – 2019-06-17 (×4): 1 via ORAL
  Filled 2019-06-14 (×4): qty 1

## 2019-06-14 MED ORDER — CIPROFLOXACIN HCL 500 MG PO TABS
500.0000 mg | ORAL_TABLET | Freq: Two times a day (BID) | ORAL | Status: DC
  Administered 2019-06-14 – 2019-06-16 (×6): 500 mg via ORAL
  Filled 2019-06-14 (×6): qty 1

## 2019-06-14 MED ORDER — POLYETHYLENE GLYCOL 3350 17 G PO PACK: 17.0000 g | PACK | Freq: Every day | ORAL | Status: DC | PRN

## 2019-06-14 NOTE — Evaluation (Signed)
Occupational Therapy Evaluation Patient Details Name: MARKY BURESH MRN: [id number] DOB: Jan 13, 1946 Today's Date: 06/14/2019    History of Present Illness 73 y o man admitted with SOB and sepsis. He was recently admitted 7/11 - 7/18 for respiratory failure and sepsis 2* e coli bacteremia in necrotic bladder tumor.  PMH:  metastatic bladder CA, COPD on 6 liters 02 at baseline, CAD, CHF, HTN anxiety and OSA   Clinical Impression   Pt was admitted for the above.  At baseline, he uses 6 liters 02 and ambulates in the house without an AD.  His wife assists with adls/showering as needed.  Pt was limited today by abdominal pain and WOB.  He participated well and practices energy conservation. He will benefit from continued OT in acute to build activity tolerance, working towards baseline function    Follow Up Recommendations  Supervision/Assistance - 24 hour    Equipment Recommendations  None recommended by OT(pt feels his toilet is fine)    Recommendations for Other Services       Precautions / Restrictions Precautions Precautions: Fall Restrictions Weight Bearing Restrictions: No      Mobility Bed Mobility               General bed mobility comments: oob  Transfers Overall transfer level: Needs assistance Equipment used: None Transfers: Sit to/from Stand Sit to Stand: Min guard         General transfer comment: for safety; min +2 assist to steady when ambulating    Balance                                           ADL either performed or assessed with clinical judgement   ADL Overall ADL's : Needs assistance/impaired                         Toilet Transfer: Minimal assistance;+2 for safety/equipment;Ambulation(back to chair (simulated))             General ADL Comments: pt now with abdominal pain so needs extensive assistance for LB adls. Wife helped as needed prior to admission.  UB adls impacted by increased WOB.  Pt  demonstrates good awareness of limitations, takes standing forward leaning rest breaks to recover and paces himself.      Vision         Perception     Praxis      Pertinent Vitals/Pain Pain Assessment: Faces Faces Pain Scale: Hurts even more Pain Location: abdomen Pain Descriptors / Indicators: Aching Pain Intervention(s): Limited activity within patient's tolerance;Monitored during session;Repositioned;RN gave pain meds during session     Hand Dominance Right   Extremity/Trunk Assessment Upper Extremity Assessment Upper Extremity Assessment: (not assessed 2* work of breathing increased)           Communication Communication Communication: No difficulties   Cognition Arousal/Alertness: Awake/alert Behavior During Therapy: WFL for tasks assessed/performed Overall Cognitive Status: Within Functional Limits for tasks assessed                                 General Comments: Initially a little apprehensive about therapy as MD didn't tell him we were coming.  Participated well and appreciative of therapy coming   General Comments  sats down to 86% at end of session; wave  form was not good. WOB increased.  RR in high 20s    Exercises     Shoulder Instructions      Home Living Family/patient expects to be discharged to:: Private residence Living Arrangements: Spouse/significant other Available Help at Discharge: Family Type of Home: House Home Access: Stairs to enter Technical brewer of Steps: 4 Entrance Stairs-Rails: Right;Can reach both;Left Home Layout: One level     Bathroom Shower/Tub: Teacher, early years/pre: [place]: Environmental consultant - 2 wheels;Wheelchair - Biomedical scientist Comments: pt states toilet is high enough      Prior Functioning/Environment          Comments: wife helps with bathing        OT Problem List: Decreased activity tolerance;Cardiopulmonary status limiting  activity;Pain      OT Treatment/Interventions: Self-care/ADL training;DME and/or AE instruction;Balance training;Patient/family education;Therapeutic activities    OT Goals(Current goals can be found in the care plan section) Acute Rehab OT Goals Patient Stated Goal: pt states he is not ready to give up.  Wants to go home when able OT Goal Formulation: With patient Time For Goal Achievement: 06/28/19 Potential to Achieve Goals: Good ADL Goals Pt Will Perform Grooming: (P) with min guard assist;standing Pt Will Transfer to Toilet: (P) with min guard assist;ambulating;regular height toilet  OT Frequency: Min 2X/week   Barriers to D/C:            Co-evaluation PT/OT/SLP Co-Evaluation/Treatment: Yes Reason for Co-Treatment: For patient/therapist safety;To address functional/ADL transfers PT goals addressed during session: Mobility/safety with mobility OT goals addressed during session: ADL's and self-care;Proper use of Adaptive equipment and DME      AM-PAC OT "6 Clicks" Daily Activity     Outcome Measure Help from another person eating meals?: None Help from another person taking care of personal grooming?: A Little Help from another person toileting, which includes using toliet, bedpan, or urinal?: A Lot Help from another person bathing (including washing, rinsing, drying)?: A Lot Help from another person to put on and taking off regular upper body clothing?: A Lot Help from another person to put on and taking off regular lower body clothing?: Total 6 Click Score: 14   End of Session    Activity Tolerance: Patient limited by fatigue Patient left: in chair;with call bell/phone within reach  OT Visit Diagnosis: Unsteadiness on feet (R26.81)                Time: 6761-9509 OT Time Calculation (min): 26 min Charges:  OT General Charges $OT Visit: 1 Visit OT Evaluation $OT Eval Low Complexity: 1 Low  Lesle Chris, OTR/L Acute Rehabilitation Services [phone] [facility]  pager [phone] office 06/14/2019  [place] 06/14/2019, 4:00 PM

## 2019-06-14 NOTE — Progress Notes (Signed)
PROGRESS NOTE    CHRISTERPHER CLOS  GQQ:[id number] DOB: 02/10/1946 DOA: 06/10/2019 PCP: [name], MD     Brief Narrative:  [name] a 73 y.o.malewith medical history significant formetastatic bladder cancer, COPD with chronic respiratory failure on 6 L supplemental O2 via [place], CAD s/p PCI with DES, chronic diastolic CHF, hypertension, hyperlipidemia, anemia of chronic disease, anxiety, and OSA on BiPAP whopresents to the ED for evaluation of dyspnea.  Patient was recently admitted from 05/26/2019-06/02/2019 at which time he was treated for hypoxic respiratory failure secondary to COPD exacerbation. He was also found to have sepsis due to E. coli bacteremia felt secondary to necrotic bladder tumor.  Patient was feeling well until today when he developed new onset shortness of breath when he got up to go outside of his house. He had noticed some wheezing as well. He states that he felt his lungs were getting tight and he denies using home nebulizer treatment and time therefore called EMS. He denies any chest pain, palpitations, dysuria. He has abdominal pain from his bladder tumor. He reports swelling in both of his legs which he feels is stable.  Per ED documentation patient was on his home 6 L O2 via [place] with O2 saturation in the 80s on EMS arrival. He was placed on BiPAP with relief. When taken off BiPAP he again became very short of breath. He was also noted to have a temperature 102 Fahrenheit. He was subsequently brought to the ED for further evaluation.  Patient was seen by hospital service admitted to SDU and treated for acute on chronic respiratory failure with COPD exacerbation, sepsis with bacteremia gram-negative rods, and elevated troponins with no interventional recommendations by cardiology.   New events last 24 hours / Subjective: No issues overnight, weaned to baseline 6 L nasal cannula O2  Assessment & Plan:   Principal Problem:   Acute on chronic  respiratory failure (HCC) Active Problems:   Malignant neoplasm of bladder (HCC)   Anxiety   Essential hypertension   End stage COPD (HCC)   OSA treated with BiPAP   Chronic diastolic CHF (congestive heart failure) (HCC)   Anemia of chronic disease   Sepsis ([place])   Acute on chronic respiratory failuredue to exacerbation of COPD -Off BiPAP this morning, back on his baseline 6 L nasal cannula O2  Sepsis secondary to Enterobacter bacteremia -Recent E. coli bacteremia now presenting with fever, leukocytosis, tachypnea, tachycardia. Urinalysis negative, however obtained after patient received antibiotics -Blood culture sensitive to cefepime, Cipro -Transition to oral Cipro today  Elevated troponin in setting of CADs/p PCI w/ DES in 2008 -Suspect demand injury from respiratory failure and sepsis as above. Per EDP discussion with cardiology he is not aninterventional candidate. 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 Status post radiation therapy, neurosurgery and chemotherapy per patient. Follows with palliative care.   Anemia of chronic disease -Hemoglobin 8.0 on admission. Recently required transfusion due to low hemoglobin of 6.7 on 05/28/2019. -Monitor CBC, hemoglobin 7.7 today.  Transfuse 1 unit packed red blood cell to goal hemoglobin greater than 8 in setting of CAD  Hypertension -Metoprolol  Hyperlipidemia -Continue simvastatin  Anxiety -Continue home Xanax as needed  OSA -Continue BiPAP nightly       DVT prophylaxis:  SCD Code Status: DNR Family Communication: None Disposition Plan: Pending further stabilization of hemoglobin, possible discharge home  7/31. PT OT ordered.    Consultants:   None  Procedures:   None  Antimicrobials:  Anti-infectives (From admission, onward)   Start     Dose/Rate Route Frequency Ordered Stop   06/14/19 0800  ciprofloxacin (CIPRO) tablet 500 mg     500 mg Oral 2 times daily 06/14/19 0748     06/12/19 0300  vancomycin (VANCOCIN) 1,250 mg in sodium chloride 0.9 % 250 mL IVPB  Status:  Discontinued     1,250 mg 166.7 mL/hr over 90 Minutes Intravenous Daily 06/11/19 0020 06/11/19 1605   06/11/19 0400  ceFEPIme (MAXIPIME) 2 g in sodium chloride 0.9 % 100 mL IVPB  Status:  Discontinued     2 g 200 mL/hr over 30 Minutes Intravenous Every 8 hours 06/11/19 0020 06/14/19 0748   06/11/19 0015  vancomycin (VANCOCIN) 1,750 mg in sodium chloride 0.9 % 500 mL IVPB     1,750 mg 250 mL/hr over 120 Minutes Intravenous  Once 06/11/19 0008 06/11/19 0223   06/10/19 2015  metroNIDAZOLE (FLAGYL) IVPB 500 mg     500 mg 100 mL/hr over 60 Minutes Intravenous  Once 06/10/19 2005 06/10/19 2224   06/10/19 2015  vancomycin (VANCOCIN) IVPB 1000 mg/200 mL premix  Status:  Discontinued     1,000 mg 200 mL/hr over 60 Minutes Intravenous  Once 06/10/19 2005 06/11/19 0008   06/10/19 2015  ceFEPIme (MAXIPIME) 2 g in sodium chloride 0.9 % 100 mL IVPB     2 g 200 mL/hr over 30 Minutes Intravenous  Once 06/10/19 2005 06/10/19 2049       Objective: Vitals:   06/14/19 0400 06/14/19 0410 06/14/19 0800 06/14/19 0816  BP: (!) 115/53 (!) 115/53    Pulse: (!) 57 (!) 122    Resp: (!) 24 (!) 24    Temp:   98.5 F (36.9 C)   TempSrc:   Oral   SpO2: 99% 100%  100%  Weight:      Height:        Intake/Output Summary (Last 24 hours) at 06/14/2019 1046 Last data filed at 06/14/2019 0911 Gross per 24 hour  Intake 437.54 ml  Output 1500 ml  Net -1062.46 ml   Filed Weights   06/11/19 0500 06/12/19 0500 06/13/19 0500  Weight: 87.2 kg 86.7 kg 88.7 kg    Examination: General exam: Appears calm and comfortable  Respiratory system:  Clear to auscultation. Respiratory effort normal.  On 6 L nasal cannula O2, currently receiving breathing treatment Cardiovascular system: S1 & S2 heard, RRR. No JVD, murmurs, rubs, gallops or clicks. + Bilateral pitting pedal edema. Gastrointestinal system: Abdomen is nondistended, soft and nontender. No organomegaly or masses felt. Normal bowel sounds heard. Central nervous system: Alert and oriented. No focal neurological deficits. Extremities: Symmetric 5 x 5 power. Skin: No rashes, lesions or ulcers Psychiatry: Judgement and insight appear normal. Mood & affect appropriate.   Data Reviewed: I have personally reviewed following labs and imaging studies  CBC: Recent Labs  Lab 06/10/19 1904 06/11/19 0556 06/11/19 1749 06/12/19 0819 06/13/19 0251 06/14/19 0525  WBC 16.0* 7.5  --  10.6* 11.3* 9.7  NEUTROABS 14.8*  --   --  9.3* 9.8*  --   HGB 8.0* 6.4* 9.6* 9.2* 8.2* 7.7*  HCT 24.6* 20.8* 29.9* 28.2* 25.9* 23.5*  MCV 95.0 98.1  --  93.7 93.8 94.4  PLT 138* 128*  --  135* 121* 128*   Basic Metabolic Panel: Recent Labs  Lab 06/10/19 1727 06/11/19 0556 06/12/19 0819 06/13/19 0251 06/14/19 0525  NA 131* 132* 131* 130* 130*  K 4.4 4.7 4.3 3.9 3.7  CL 93* 97* 95* 94* 94*  CO2 27 25 26 26 27   GLUCOSE 208* 291* 192* 175* 197*  BUN 24* 24* 24* 24* 23  CREATININE 0.94 0.79 0.84 0.94 0.94  CALCIUM 8.4* 7.8* 8.1* 7.8* 7.9*   GFR: Estimated Creatinine Clearance: 72 mL/min (by C-G formula based on SCr of 0.94 mg/dL). Liver Function Tests: No results for input(s): AST, ALT, ALKPHOS, BILITOT, PROT, ALBUMIN in the last 168 hours. No results for input(s): LIPASE, AMYLASE in the last 168 hours. No results for input(s): AMMONIA in the last 168 hours. Coagulation Profile: No results for input(s): INR, PROTIME in the last 168 hours. Cardiac Enzymes: No results for input(s): CKTOTAL, CKMB, CKMBINDEX, TROPONINI in the last 168 hours. BNP (last 3 results) No results for input(s): PROBNP  in the last 8760 hours. HbA1C: No results for input(s): HGBA1C in the last 72 hours. CBG: No results for input(s): GLUCAP in the last 168 hours. Lipid Profile: No results for input(s): CHOL, HDL, LDLCALC, TRIG, CHOLHDL, LDLDIRECT in the last 72 hours. Thyroid Function Tests: No results for input(s): TSH, T4TOTAL, FREET4, T3FREE, THYROIDAB in the last 72 hours. Anemia Panel: No results for input(s): VITAMINB12, FOLATE, FERRITIN, TIBC, IRON, RETICCTPCT in the last 72 hours. Sepsis Labs: Recent Labs  Lab 06/10/19 1728 06/10/19 1928  LATICACIDVEN 3.1* 1.7    Recent Results (from the past 240 hour(s))  SARS Coronavirus 2 (CEPHEID- Performed in [facility] hospital lab), Hosp Order     Status: None   Collection Time: 06/10/19  5:28 PM   Specimen: Nasopharyngeal Swab  Result Value Ref Range Status   SARS Coronavirus 2 NEGATIVE NEGATIVE Final    Comment: (NOTE) If result is NEGATIVE SARS-CoV-2 target nucleic acids are NOT DETECTED. The SARS-CoV-2 RNA is generally detectable in upper and lower  respiratory specimens during the acute phase of infection. The lowest  concentration of SARS-CoV-2 viral copies this assay can detect is 250  copies / mL. A negative result does not preclude SARS-CoV-2 infection  and should not be used as the sole basis for treatment or other  patient management decisions.  A negative result may occur with  improper specimen collection / handling, submission of specimen other  than nasopharyngeal swab, presence of viral mutation(s) within the  areas targeted by this assay, and inadequate number of viral copies  (<250 copies / mL). A negative result must be combined with clinical  observations, patient history, and epidemiological information. If result is POSITIVE SARS-CoV-2 target nucleic acids are DETECTED. The SARS-CoV-2 RNA is generally detectable in upper and lower  respiratory specimens dur ing the acute phase of infection.  Positive  results are  indicative of active infection with SARS-CoV-2.  Clinical  correlation with patient history and other diagnostic information is  necessary to determine patient infection status.  Positive results do  not rule out bacterial infection or co-infection with other viruses. If result is PRESUMPTIVE POSTIVE SARS-CoV-2 nucleic acids MAY BE PRESENT.   A presumptive positive result was obtained on the submitted specimen  and confirmed on repeat testing.  While 2019 novel coronavirus  (SARS-CoV-2) nucleic acids may be present in the submitted sample  additional confirmatory testing may be necessary for epidemiological  and / or clinical management purposes  to differentiate between  SARS-CoV-2 and other Sarbecovirus currently known to infect humans.  If clinically indicated additional testing with an alternate test  methodology [id number]) is advised. The SARS-CoV-2 RNA is generally  detectable in upper and lower respiratory sp ecimens during the acute  phase of infection. The expected result is Negative. Fact Sheet for Patients:  StrictlyIdeas.no Fact Sheet for Healthcare Providers: BankingDealers.co.za This test is not yet approved or cleared by the [place] FDA and has been authorized for detection and/or diagnosis of SARS-CoV-2 by FDA under an Emergency Use Authorization (EUA).  This EUA will remain in effect (meaning this test can be used) for the duration of the COVID-19 declaration under Section 564(b)(1) of the Act, 21 U.S.C. section 360bbb-3(b)(1), unless the authorization is terminated or revoked sooner. Performed at [facility], [place] [address] [place], [place] [address]   Culture, blood (routine x 2)     Status: Abnormal   Collection Time: 06/10/19  5:28 PM   Specimen: Left Antecubital; Blood  Result Value Ref Range Status   Specimen Description   Final    LEFT ANTECUBITAL Performed at [facility] [address], [place], [place] [address]    Special Requests   Final    BOTTLES DRAWN AEROBIC AND ANAEROBIC Blood Culture adequate volume Performed at [facility] [address], [place], [place] [address]    Culture  Setup Time   Final    GRAM NEGATIVE RODS IN BOTH AEROBIC AND ANAEROBIC BOTTLES CRITICAL RESULT CALLED TO, READ BACK BY AND VERIFIED WITH: [place] [phone] FCP Performed at [facility], [place] [address] [place],  [address]    Culture ENTEROBACTER CLOACAE (A)  Final   Report Status 06/13/2019 FINAL  Final   Organism ID, Bacteria ENTEROBACTER CLOACAE  Final      Susceptibility   Enterobacter cloacae - MIC*    CEFAZOLIN >=64 RESISTANT Resistant     CEFEPIME 2 SENSITIVE Sensitive     CEFTAZIDIME >=64 RESISTANT Resistant     CEFTRIAXONE >=64 RESISTANT Resistant     CIPROFLOXACIN <=0.25 SENSITIVE Sensitive     GENTAMICIN <=1 SENSITIVE Sensitive     IMIPENEM <=0.25 SENSITIVE Sensitive     TRIMETH/SULFA <=20 SENSITIVE Sensitive     PIP/TAZO >=128 RESISTANT Resistant     * ENTEROBACTER CLOACAE  Blood Culture ID Panel (Reflexed)     Status: Abnormal   Collection Time: 06/10/19  5:28 PM  Result Value Ref Range Status   Enterococcus species NOT DETECTED NOT DETECTED Final   Listeria monocytogenes NOT DETECTED NOT DETECTED Final   Staphylococcus species NOT DETECTED NOT DETECTED Final   Staphylococcus aureus (BCID) NOT DETECTED NOT DETECTED Final   Streptococcus species NOT DETECTED NOT DETECTED Final   Streptococcus agalactiae NOT DETECTED NOT DETECTED Final   Streptococcus pneumoniae NOT DETECTED NOT DETECTED Final   Streptococcus pyogenes NOT DETECTED NOT DETECTED Final   Acinetobacter baumannii NOT DETECTED NOT DETECTED Final   Enterobacteriaceae species DETECTED (A) NOT DETECTED Final    Comment: Enterobacteriaceae represent a large family of gram-negative bacteria, not a single organism. CRITICAL RESULT CALLED  TO, READ BACK BY AND VERIFIED WITH: PHARMD MARY YIFOY [phone] FCP    Enterobacter cloacae complex DETECTED (A) NOT DETECTED Final    Comment: CRITICAL RESULT CALLED TO, READ BACK BY AND VERIFIED WITH: PHARMD MARY SWAIN [phone] FCP    Escherichia coli NOT DETECTED NOT DETECTED Final   Klebsiella oxytoca NOT DETECTED  NOT DETECTED Final   Klebsiella pneumoniae NOT DETECTED NOT DETECTED Final   Proteus species NOT DETECTED NOT DETECTED Final   Serratia marcescens NOT DETECTED NOT DETECTED Final   Carbapenem resistance NOT DETECTED NOT DETECTED Final   Haemophilus influenzae NOT DETECTED NOT DETECTED Final   Neisseria meningitidis NOT DETECTED NOT DETECTED Final   Pseudomonas aeruginosa NOT DETECTED NOT DETECTED Final   Candida albicans NOT DETECTED NOT DETECTED Final   Candida glabrata NOT DETECTED NOT DETECTED Final   Candida krusei NOT DETECTED NOT DETECTED Final   Candida parapsilosis NOT DETECTED NOT DETECTED Final   Candida tropicalis NOT DETECTED NOT DETECTED Final    Comment: Performed at [facility], [place] [address] [place], [place] [address]  Culture, blood (routine x 2)     Status: Abnormal   Collection Time: 06/10/19  5:33 PM   Specimen: BLOOD LEFT HAND  Result Value Ref Range Status   Specimen Description   Final    BLOOD LEFT HAND Performed at [facility] [address], [place], [place] [address]    Special Requests   Final    BOTTLES DRAWN AEROBIC AND ANAEROBIC Blood Culture adequate volume Performed at [facility] [address], [place], [place] [address]    Culture  Setup Time   Final    GRAM NEGATIVE RODS IN BOTH AEROBIC AND ANAEROBIC BOTTLES CRITICAL VALUE NOTED.  VALUE IS CONSISTENT WITH PREVIOUSLY REPORTED AND CALLED VALUE.    Culture (A)  Final    ENTEROBACTER CLOACAE SUSCEPTIBILITIES PERFORMED ON PREVIOUS CULTURE WITHIN THE LAST 5 DAYS. Performed at [facility], [place] [address]  [place], [place] [address]    Report Status 06/13/2019 FINAL  Final  MRSA PCR Screening     Status: None   Collection Time: 06/10/19 10:46 PM   Specimen: Nasal Mucosa; Nasopharyngeal  Result Value Ref Range Status   MRSA by PCR NEGATIVE NEGATIVE Final    Comment:        The GeneXpert MRSA Assay (FDA approved for NASAL specimens only), is one component of a comprehensive MRSA colonization surveillance program. It is not intended to diagnose MRSA infection nor to guide or monitor treatment for MRSA infections. Performed at [facility], [place] [address], [place], [place] [address]   Urine culture     Status: Abnormal   Collection Time: 06/11/19 12:00 AM   Specimen: Urine, Random  Result Value Ref Range Status   Specimen Description   Final    URINE, RANDOM Performed at [facility] [address] [place], [place] [address]    Special Requests   Final    NONE Performed at [facility], [place] [address], [place], [place] [address]    Culture (A)  Final    <10,000 COLONIES/mL INSIGNIFICANT GROWTH Performed at [facility] [address], [place], [place] [address]    Report Status 06/12/2019 FINAL  Final      Radiology Studies: Dg Chest Port 1 View  Result Date: 06/13/2019 CLINICAL DATA:  Shortness of breath and fever. History of metastatic sarcoma. EXAM: PORTABLE CHEST 1 VIEW COMPARISON:  Multiple previous recent chest x-rays and chest CTs. FINDINGS: The cardiac silhouette, mediastinal and hilar contours are within normal limits and stable. Stable advanced emphysematous changes and pulmonary scarring. Stable pulmonary metastatic lesions. Stable basilar compressive atelectasis and vascular crowding. No pleural effusions. IMPRESSION: Stable emphysematous changes, pulmonary scarring and metastatic pulmonary nodules. No  acute overlying pulmonary process. Electronically Signed   By: Marijo Sanes M.D.   On: 06/13/2019 08:13       Scheduled Meds: . sodium chloride   Intravenous Once  . ALPRAZolam  0.5 mg Oral QHS  . budesonide  0.25 mg Nebulization BID  . chlorhexidine  15 mL Mouth Rinse BID  . Chlorhexidine Gluconate Cloth  6 each Topical Daily  . ciprofloxacin  500 mg Oral BID  . furosemide  20 mg Intravenous BID  . ipratropium-albuterol  3 mL Nebulization Q4H  . mouth rinse  15 mL Mouth Rinse q12n4p  . metoprolol tartrate  25 mg Oral BID  . pantoprazole  40 mg Oral Daily  . simvastatin  20 mg Oral q1800  . sodium chloride flush  10-40 mL Intracatheter Q12H  . sodium chloride flush  3 mL Intravenous Q12H  . vitamin B-12  1,000 mcg Oral Daily   Continuous Infusions:    LOS: 4 days      Time spent: 30 minutes   Dessa Phi, DO [facility] [email or web address] 06/14/2019, 10:46 AM

## 2019-06-14 NOTE — Progress Notes (Signed)
Pharmacy Antibiotic Note  [name] is a 73 y.o. male admitted on 06/10/2019 with sepsis.  Pharmacy has been consulted for cipro dosing. 06/14/2019 Full abx day #4. To transition to PO cipro for enterobacter cloacae bacteremia from necrotic bladder source.  WBC down to WNL, AF. SCr 0.94.   Plan: Cipro 500 mg PO bid  Height: 5\' 6"  (167.6 cm) Weight: 195 lb 8.8 oz (88.7 kg) IBW/kg (Calculated) : 63.8  Temp (24hrs), Avg:98.8 F (37.1 C), Min:98.1 F (36.7 C), Max:99.1 F (37.3 C)  Recent Labs  Lab 06/10/19 1727 06/10/19 1728 06/10/19 1904 06/10/19 1928 06/11/19 0556 06/12/19 0819 06/13/19 0251 06/14/19 0525  WBC  --   --  16.0*  --  7.5 10.6* 11.3* 9.7  CREATININE 0.94  --   --   --  0.79 0.84 0.94 0.94  LATICACIDVEN  --  3.1*  --  1.7  --   --   --   --     Estimated Creatinine Clearance: 72 mL/min (by C-G formula based on SCr of 0.94 mg/dL).    Allergies  Allergen Reactions  . Amlodipine Swelling    Antimicrobials this admission: 7/26 cefepime >> 7/30 7/30 Cipro>> 7/26 vancomycin >> 7/27 7/26 flagyl x 1 dose Dose adjustments this admission:  Microbiology results: 7/26 MRSA PCR neg 7/27 UCx (after abx): insig growth F 7/26 BCx2:BCID  2/4 anaerobic bottles + enterobacter cloacae complex, no KPC, Sens cefepime, cipro, gent, imi, septra 7/26 Covid neg Thank you for allowing pharmacy to be a part of this patient's care.  Eudelia Bunch, Pharm.D [phone] 06/14/2019 7:36 AM

## 2019-06-14 NOTE — Evaluation (Signed)
Physical Therapy Evaluation Patient Details Name: [name] MRN: [id number] DOB: [birth year] Today's Date: 06/14/2019   History of Present Illness  73 y o man admitted with SOB and sepsis. He was recently admitted 7/11 - 7/18 for respiratory failure and sepsis 2* e coli bacteremia in necrotic bladder tumor.  PMH:  metastatic bladder CA, COPD on 6 liters 02 at baseline, CAD, CHF, HTN anxiety and OSA  Clinical Impression   Pt presents with generalized weakness, lower abdominal pain, difficulty performing mobility tasks, severe dyspnea on exertion with O2 desaturation on 6LO2 during short distance ambulation, and decreased activity tolerance due to respiratory status and weakness. Pt to benefit from acute PT to address deficits. Pt ambulated 15-20 ft in room, requiring 3 standing rest breaks with tripod UE positioning to recover dyspnea, self-regulated breaks. Pt very limited by respiratory status, but states at home he does well with regulating speech and mobility to recover dyspnea/sats. PT recommending HHPT to address deficits post-acutely. PT to progress mobility as tolerated, and will continue to follow acutely.      Follow Up Recommendations Home health PT;Supervision for mobility/OOB    Equipment Recommendations  None recommended by PT    Recommendations for Other Services       Precautions / Restrictions Precautions Precautions: Fall Restrictions Weight Bearing Restrictions: No Other Position/Activity Restrictions: WBAT      Mobility  Bed Mobility Overal bed mobility: Needs Assistance             General bed mobility comments: oob  Transfers Overall transfer level: Needs assistance Equipment used: None Transfers: Sit to/from Stand Sit to Stand: Min guard         General transfer comment: for safety; increased time to rise with rest needed prior to standing.  Ambulation/Gait Ambulation/Gait assistance: Min assist;+2 safety/equipment Gait Distance (Feet):  20 Feet Assistive device: 2 person hand held assist Gait Pattern/deviations: Step-through pattern;Decreased stride length;Trunk flexed;Wide base of support Gait velocity: decr   General Gait Details: Min assist for steadying, lines and leads management. Pt with standing rest break x3 during ambulation for recovery of dyspnea 3/4, sats 86-95% on 6LO2. Pt performing tripod position with upper body to increase accessory respiratory muscle recruitment.  Stairs            Wheelchair Mobility    Modified Rankin (Stroke Patients Only)       Balance Overall balance assessment: Needs assistance Sitting-balance support: No upper extremity supported Sitting balance-Leahy Scale: Good     Standing balance support: Single extremity supported Standing balance-Leahy Scale: Fair                               Pertinent Vitals/Pain Pain Assessment: 0-10 Pain Score: 5  Faces Pain Scale: Hurts even more Pain Location: abdomen Pain Descriptors / Indicators: Sore Pain Intervention(s): Limited activity within patient's tolerance;Monitored during session;RN gave pain meds during session;Repositioned    Home Living Family/patient expects to be discharged to:: Private residence Living Arrangements: Spouse/significant other Available Help at Discharge: Family Type of Home: House Home Access: Stairs to enter Entrance Stairs-Rails: Right;Can reach Software engineer of Steps: 4 Home Layout: One level Home Equipment: Environmental consultant - 2 wheels;Wheelchair - manual;Shower seat Additional Comments: pt states toilet is high enough    Prior Function           Comments: wife helps with bathing     Hand Dominance   Dominant Hand: Right  Extremity/Trunk Assessment   Upper Extremity Assessment Upper Extremity Assessment: Defer to OT evaluation    Lower Extremity Assessment Lower Extremity Assessment: Generalized weakness    Cervical / Trunk Assessment Cervical /  Trunk Assessment: Normal  Communication   Communication: No difficulties  Cognition Arousal/Alertness: Awake/alert Behavior During Therapy: WFL for tasks assessed/performed Overall Cognitive Status: Within Functional Limits for tasks assessed                                 General Comments: Initially a little apprehensive about therapy as MD didn't tell him we were coming.  Participated well and appreciative of therapy coming      General Comments General comments (skin integrity, edema, etc.): Sats 86% on 6LO2 after ambulation, RR up to high 20s    Exercises     Assessment/Plan    PT Assessment Patient needs continued PT services  PT Problem List Decreased strength;Decreased mobility;Decreased activity tolerance;Decreased balance;Pain;Obesity       PT Treatment Interventions Therapeutic activities;Gait training;Therapeutic exercise;Patient/family education;Balance training;Functional mobility training    PT Goals (Current goals can be found in the Care Plan section)  Acute Rehab PT Goals Patient Stated Goal: pt states he is not ready to give up.  Wants to go home when able PT Goal Formulation: With patient Time For Goal Achievement: 06/28/19 Potential to Achieve Goals: Good    Frequency Min 3X/week   Barriers to discharge        Co-evaluation   Reason for Co-Treatment: For patient/therapist safety;To address functional/ADL transfers PT goals addressed during session: Mobility/safety with mobility OT goals addressed during session: ADL's and self-care;Proper use of Adaptive equipment and DME       AM-PAC PT "6 Clicks" Mobility  Outcome Measure Help needed turning from your back to your side while in a flat bed without using bedrails?: A Little Help needed moving from lying on your back to sitting on the side of a flat bed without using bedrails?: A Little Help needed moving to and from a bed to a chair (including a wheelchair)?: A Little Help needed  standing up from a chair using your arms (e.g., wheelchair or bedside chair)?: A Little Help needed to walk in hospital room?: A Little Help needed climbing 3-5 steps with a railing? : A Lot 6 Click Score: 17    End of Session Equipment Utilized During Treatment: Oxygen Activity Tolerance: Patient limited by fatigue;Treatment limited secondary to medical complications (Comment)(dyspnea, desat) Patient left: in chair;with call bell/phone within reach Nurse Communication: Mobility status PT Visit Diagnosis: Other abnormalities of gait and mobility (R26.89);Difficulty in walking, not elsewhere classified (R26.2)    Time: 6015-6153 PT Time Calculation (min) (ACUTE ONLY): 27 min   Charges:   PT Evaluation $PT Eval Low Complexity: 1 Low          Rola Lennon Conception Chancy, PT Acute Rehabilitation Services Pager [phone]  Office [phone]   Roxine Caddy D Elonda Husky 06/14/2019, 4:35 PM

## 2019-06-15 ENCOUNTER — Inpatient Hospital Stay (HOSPITAL_COMMUNITY): Payer: Medicare Other

## 2019-06-15 LAB — URINALYSIS, ROUTINE W REFLEX MICROSCOPIC
Bacteria, UA: NONE SEEN
Bilirubin Urine: NEGATIVE
Glucose, UA: NEGATIVE mg/dL
Hgb urine dipstick: NEGATIVE
Ketones, ur: NEGATIVE mg/dL
Leukocytes,Ua: NEGATIVE
Nitrite: NEGATIVE
Protein, ur: 100 mg/dL — AB
Specific Gravity, Urine: 1.015 (ref 1.005–1.030)
pH: 6 (ref 5.0–8.0)

## 2019-06-15 LAB — CBC
HCT: 25.3 % — ABNORMAL LOW (ref 39.0–52.0)
Hemoglobin: 8.1 g/dL — ABNORMAL LOW (ref 13.0–17.0)
MCH: 30.2 pg (ref 26.0–34.0)
MCHC: 32 g/dL (ref 30.0–36.0)
MCV: 94.4 fL (ref 80.0–100.0)
Platelets: 120 10*3/uL — ABNORMAL LOW (ref 150–400)
RBC: 2.68 MIL/uL — ABNORMAL LOW (ref 4.22–5.81)
RDW: 16.3 % — ABNORMAL HIGH (ref 11.5–15.5)
WBC: 7.6 10*3/uL (ref 4.0–10.5)
nRBC: 0 % (ref 0.0–0.2)

## 2019-06-15 LAB — BASIC METABOLIC PANEL
Anion gap: 9 (ref 5–15)
BUN: 22 mg/dL (ref 8–23)
CO2: 29 mmol/L (ref 22–32)
Calcium: 8 mg/dL — ABNORMAL LOW (ref 8.9–10.3)
Chloride: 90 mmol/L — ABNORMAL LOW (ref 98–111)
Creatinine, Ser: 0.96 mg/dL (ref 0.61–1.24)
GFR calc Af Amer: >60 mL/min (ref >60)
GFR calc non Af Amer: >60 mL/min (ref >60)
Glucose, Bld: 163 mg/dL — ABNORMAL HIGH (ref 70–99)
Potassium: 3.8 mmol/L (ref 3.5–5.1)
Sodium: 128 mmol/L — ABNORMAL LOW (ref 135–145)

## 2019-06-15 MED ORDER — SODIUM CHLORIDE 1 G PO TABS
1.0000 g | ORAL_TABLET | Freq: Once | ORAL | Status: AC
  Administered 2019-06-15: 1 g via ORAL
  Filled 2019-06-15: qty 1

## 2019-06-15 MED ORDER — FUROSEMIDE 40 MG PO TABS
40.0000 mg | ORAL_TABLET | Freq: Every day | ORAL | Status: DC
  Administered 2019-06-15 – 2019-06-16 (×2): 40 mg via ORAL
  Filled 2019-06-15 (×2): qty 1

## 2019-06-15 MED ORDER — FUROSEMIDE 40 MG PO TABS: 40.0000 mg | ORAL_TABLET | Freq: Every day | ORAL | Status: DC

## 2019-06-15 NOTE — Progress Notes (Signed)
Physical Therapy Treatment Patient Details Name: MCKENNA BORUFF MRN: [id number] DOB: Mar 12, 1946 Today's Date: 06/15/2019    History of Present Illness 73 y o man admitted with SOB and sepsis. He was recently admitted 7/11 - 7/18 for respiratory failure and sepsis 2* e coli bacteremia in necrotic bladder tumor.  PMH:  metastatic bladder CA, COPD on 6 liters 02 at baseline, CAD, CHF, HTN anxiety and OSA    PT Comments    Pt agreeable to room ambulation, but defers gait distance progression today vs yesterday. Pt maintained sats well above 92% on 6LO2 this session, required standing rest breaks x3 in UE tripod position to recruit accessory respiratory muscle use. Pt plans to d/c home tomorrow. Per pt, he has a w/c, and PT recommended using wheelchair to get from car to house and for longer distances for energy conservation. PT to continue to follow acutely.    Follow Up Recommendations  Home health PT;Supervision for mobility/OOB     Equipment Recommendations  None recommended by PT    Recommendations for Other Services       Precautions / Restrictions Precautions Precautions: Fall Restrictions Weight Bearing Restrictions: No Other Position/Activity Restrictions: WBAT    Mobility  Bed Mobility Overal bed mobility: Needs Assistance             General bed mobility comments: oob upon arrival to room, requesting to stay in chair upon PT exit.  Transfers Overall transfer level: Needs assistance Equipment used: None Transfers: Sit to/from [facility] Sit to Stand: Min guard Stand pivot transfers: Min assist       General transfer comment: min guard for safety, sit to stand x2 from recliner both times (before and after toileting). PT assisted pt in transfer to [facility], min assist for steadying, NT assisted pt back to bed.  Ambulation/Gait Ambulation/Gait assistance: Min assist;+2 safety/equipment Gait Distance (Feet): 20 Feet Assistive device: 2 person hand  held assist Gait Pattern/deviations: Step-through pattern;Decreased stride length;Trunk flexed;Wide base of support Gait velocity: decr   General Gait Details: Min assist for steadying, lines and leads management. Pt with standing rest break x3 during ambulation for recovery of dyspnea 3/4, leaning on bedrails for support. Pt with reaching for environment for steadying.   Stairs             Wheelchair Mobility    Modified Rankin (Stroke Patients Only)       Balance Overall balance assessment: Needs assistance Sitting-balance support: No upper extremity supported Sitting balance-Leahy Scale: Good     Standing balance support: Single extremity supported Standing balance-Leahy Scale: Fair                              Cognition Arousal/Alertness: Awake/alert Behavior During Therapy: WFL for tasks assessed/performed Overall Cognitive Status: Within Functional Limits for tasks assessed                                        Exercises      General Comments General comments (skin integrity, edema, etc.): on 6LO2, maintaining sats sas >92%. RR up to 34 breaths/min; increased time to recover breath, up to 3 minutes.      Pertinent Vitals/Pain Pain Assessment: 0-10 Pain Score: 5  Pain Location: abdomen Pain Descriptors / Indicators: Sore Pain Intervention(s): Limited activity within patient's tolerance;Monitored during session;Repositioned  Home Living                      Prior Function            PT Goals (current goals can now be found in the care plan section) Acute Rehab PT Goals Patient Stated Goal: pt states he is not ready to give up.  Wants to go home when able PT Goal Formulation: With patient Time For Goal Achievement: 06/28/19 Potential to Achieve Goals: Good Progress towards PT goals: Progressing toward goals    Frequency    Min 3X/week      PT Plan Current plan remains appropriate    Co-evaluation               AM-PAC PT "6 Clicks" Mobility   Outcome Measure  Help needed turning from your back to your side while in a flat bed without using bedrails?: A Little Help needed moving from lying on your back to sitting on the side of a flat bed without using bedrails?: A Little Help needed moving to and from a bed to a chair (including a wheelchair)?: A Little Help needed standing up from a chair using your arms (e.g., wheelchair or bedside chair)?: A Little Help needed to walk in hospital room?: A Little Help needed climbing 3-5 steps with a railing? : A Lot 6 Click Score: 17    End of Session Equipment Utilized During Treatment: Oxygen Activity Tolerance: Patient limited by fatigue;Treatment limited secondary to medical complications (Comment)(dyspnea, fatigue from toileting) Patient left: in chair;with call bell/phone within reach Nurse Communication: Mobility status PT Visit Diagnosis: Other abnormalities of gait and mobility (R26.89);Difficulty in walking, not elsewhere classified (R26.2)     Time: 5436-0677 PT Time Calculation (min) (ACUTE ONLY): 14 min  Charges:  $Gait Training: 8-22 mins                     Julien Girt, PT Acute Rehabilitation Services Pager [phone]  Office [phone]   Mahmood Boehringer D Elonda Husky 06/15/2019, 3:13 PM

## 2019-06-15 NOTE — Progress Notes (Signed)
PROGRESS NOTE    [name]  SHF:[id number] DOB: September 21, 1946 DOA: 06/10/2019 PCP: [name], MD     Brief Narrative:  Andrew Payment Wickeris a 73 y.o.malewith medical history significant formetastatic bladder cancer, COPD with chronic respiratory failure on 6 L supplemental O2 via [place], CAD s/p PCI with DES, chronic diastolic CHF, hypertension, hyperlipidemia, anemia of chronic disease, anxiety, and OSA on BiPAP whopresents to the ED for evaluation of dyspnea.  Patient was recently admitted from 05/26/2019-06/02/2019 at which time he was treated for hypoxic respiratory failure secondary to COPD exacerbation. He was also found to have sepsis due to E. coli bacteremia felt secondary to necrotic bladder tumor.  Patient was feeling well until today when he developed new onset shortness of breath when he got up to go outside of his house. He had noticed some wheezing as well. He states that he felt his lungs were getting tight and he denies using home nebulizer treatment and time therefore called EMS. He denies any chest pain, palpitations, dysuria. He has abdominal pain from his bladder tumor. He reports swelling in both of his legs which he feels is stable.  Per ED documentation patient was on his home 6 L O2 via [place] with O2 saturation in the 80s on EMS arrival. He was placed on BiPAP with relief. When taken off BiPAP he again became very short of breath. He was also noted to have a temperature 102 Fahrenheit. He was subsequently brought to the ED for further evaluation.  Patient was seen by hospital service admitted to SDU and treated for acute on chronic respiratory failure with COPD exacerbation, sepsis with bacteremia gram-negative rods, and elevated troponins with no interventional recommendations by cardiology.   New events last 24 hours / Subjective: On 6 L nasal cannula O2.  He does not feel that he could go home today with his blood count being 8.1, feels that this will  continue to drop and he will end up right back in the hospital  Assessment & Plan:   Principal Problem:   Acute on chronic respiratory failure (HCC) Active Problems:   Malignant neoplasm of bladder (HCC)   Anxiety   Essential hypertension   End stage COPD ([place])   OSA treated with BiPAP   Chronic diastolic CHF (congestive heart failure) (HCC)   Anemia of chronic disease   Sepsis ([place])   Acute on chronic respiratory failuredue to exacerbation of COPD -Off BiPAP this morning, back on his baseline 6 L nasal cannula O2  Sepsis secondary to Enterobacter bacteremia -Recent E. coli bacteremia now presenting with fever, leukocytosis, tachypnea, tachycardia. Urinalysis negative, however obtained after patient received antibiotics -Blood culture sensitive to cefepime, Cipro -Now on Cipro  Elevated troponin in setting of CADs/p PCI w/ DES in 2008 -Suspect demand injury from respiratory failure and sepsis as above. Per EDP discussion with cardiology he is not aninterventional candidate. 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 Status post radiation therapy, neurosurgery and chemotherapy per patient. Follows with palliative care.   Anemia of chronic disease -Transfused total 2 unit packed red blood cell this admission -Hemoglobin 8.1 today, continue to monitor, if lower than 8, will plan to transfuse more blood prior to discharge  Hypertension -Continue Metoprolol  Hyperlipidemia -Continue simvastatin  Anxiety -Continue home Xanax as needed  OSA -Continue BiPAP nightly       DVT prophylaxis: SCD Code Status: DNR Family Communication: None Disposition Plan:  Pending further stabilization of hemoglobin, possible discharge home 8/1.  Home health PT   Consultants:   None  Procedures:   None  Antimicrobials:  Anti-infectives (From admission, onward)   Start     Dose/Rate Route Frequency Ordered Stop   06/14/19 0800  ciprofloxacin (CIPRO) tablet 500 mg     500 mg Oral 2 times daily 06/14/19 0748     06/12/19 0300  vancomycin (VANCOCIN) 1,250 mg in sodium chloride 0.9 % 250 mL IVPB  Status:  Discontinued     1,250 mg 166.7 mL/hr over 90 Minutes Intravenous Daily 06/11/19 0020 06/11/19 1605   06/11/19 0400  ceFEPIme (MAXIPIME) 2 g in sodium chloride 0.9 % 100 mL IVPB  Status:  Discontinued     2 g 200 mL/hr over 30 Minutes Intravenous Every 8 hours 06/11/19 0020 06/14/19 0748   06/11/19 0015  vancomycin (VANCOCIN) 1,750 mg in sodium chloride 0.9 % 500 mL IVPB     1,750 mg 250 mL/hr over 120 Minutes Intravenous  Once 06/11/19 0008 06/11/19 0223   06/10/19 2015  metroNIDAZOLE (FLAGYL) IVPB 500 mg     500 mg 100 mL/hr over 60 Minutes Intravenous  Once 06/10/19 2005 06/10/19 2224   06/10/19 2015  vancomycin (VANCOCIN) IVPB 1000 mg/200 mL premix  Status:  Discontinued     1,000 mg 200 mL/hr over 60 Minutes Intravenous  Once 06/10/19 2005 06/11/19 0008   06/10/19 2015  ceFEPIme (MAXIPIME) 2 g in sodium chloride 0.9 % 100 mL IVPB     2 g 200 mL/hr over 30 Minutes Intravenous  Once 06/10/19 2005 06/10/19 2049       Objective: Vitals:   06/15/19 1000 06/15/19 1100 06/15/19 1126 06/15/19 1140  BP:   112/63   Pulse: 88 84 90   Resp: (!) 23 20 20    Temp:    99 F (37.2 C)  TempSrc:    Oral  SpO2: 100% 100% 99%   Weight:      Height:        Intake/Output Summary (Last 24 hours) at 06/15/2019 1217 Last data filed at 06/15/2019 1140 Gross per 24 hour  Intake 1195 ml  Output 2200 ml  Net -1005 ml   Filed Weights   06/12/19 0500 06/13/19 0500 06/15/19 0434  Weight: 86.7 kg 88.7 kg 88.2 kg    Examination: General exam: Appears calm  and comfortable  Respiratory system: Clear to auscultation. Respiratory effort normal.  On 6 L nasal cannula O2 Cardiovascular system: S1 & S2 heard, tachycardic, regular rhythm, + bilateral edema Gastrointestinal system: Abdomen is nondistended, soft and nontender. No organomegaly or masses felt. Normal bowel sounds heard. Central nervous system: Alert and oriented. No focal neurological deficits. Extremities: Symmetric 5 x 5 power. Skin: No rashes, lesions or ulcers Psychiatry: Judgement and insight appear normal. Mood & affect appropriate.    Data Reviewed: I have personally reviewed following labs and imaging studies  CBC: Recent Labs  Lab 06/10/19 1904 06/11/19 0556  06/12/19 0819 06/13/19 0251 06/14/19 0525 06/14/19 1500 06/15/19 0429  WBC 16.0* 7.5  --  10.6* 11.3* 9.7  --  7.6  NEUTROABS 14.8*  --   --  9.3* 9.8*  --   --   --   HGB 8.0* 6.4*   < > 9.2* 8.2* 7.7* 8.7* 8.1*  HCT 24.6* 20.8*   < > 28.2* 25.9* 23.5* 26.6* 25.3*  MCV 95.0 98.1  --  93.7 93.8 94.4  --  94.4  PLT 138* 128*  --  135* 121* 117*  --  120*   < > = values in this interval not displayed.   Basic Metabolic Panel: Recent Labs  Lab 06/11/19 0556 06/12/19 0819 06/13/19 0251 06/14/19 0525 06/15/19 0429  NA 132* 131* 130* 130* 128*  K 4.7 4.3 3.9 3.7 3.8  CL 97* 95* 94* 94* 90*  CO2 25 26 26 27 29   GLUCOSE 291* 192* 175* 197* 163*  BUN 24* 24* 24* 23 22  CREATININE 0.79 0.84 0.94 0.94 0.96  CALCIUM 7.8* 8.1* 7.8* 7.9* 8.0*   GFR: Estimated Creatinine Clearance: 70.3 mL/min (by C-G formula based on SCr of 0.96 mg/dL). Liver Function Tests: No results for input(s): AST, ALT, ALKPHOS, BILITOT, PROT, ALBUMIN in the last 168 hours. No results for input(s): LIPASE, AMYLASE in the last 168 hours. No results for input(s): AMMONIA in the last 168 hours. Coagulation Profile: No results for input(s): INR, PROTIME in the last 168 hours. Cardiac Enzymes: No results for input(s): CKTOTAL, CKMB,  CKMBINDEX, TROPONINI in the last 168 hours. BNP (last 3 results) No results for input(s): PROBNP in the last 8760 hours. HbA1C: No results for input(s): HGBA1C in the last 72 hours. CBG: No results for input(s): GLUCAP in the last 168 hours. Lipid Profile: No results for input(s): CHOL, HDL, LDLCALC, TRIG, CHOLHDL, LDLDIRECT in the last 72 hours. Thyroid Function Tests: No results for input(s): TSH, T4TOTAL, FREET4, T3FREE, THYROIDAB in the last 72 hours. Anemia Panel: No results for input(s): VITAMINB12, FOLATE, FERRITIN, TIBC, IRON, RETICCTPCT in the last 72 hours. Sepsis Labs: Recent Labs  Lab 06/10/19 1728 06/10/19 1928  LATICACIDVEN 3.1* 1.7    Recent Results (from the past 240 hour(s))  SARS Coronavirus 2 (CEPHEID- Performed in [place] hospital lab), Hosp Order     Status: None   Collection Time: 06/10/19  5:28 PM   Specimen: Nasopharyngeal Swab  Result Value Ref Range Status   SARS Coronavirus 2 NEGATIVE NEGATIVE Final    Comment: (NOTE) If result is NEGATIVE SARS-CoV-2 target nucleic acids are NOT DETECTED. The SARS-CoV-2 RNA is generally detectable in upper and lower  respiratory specimens during the acute phase of infection. The lowest  concentration of SARS-CoV-2 viral copies this assay can detect is 250  copies / mL. A negative result does not preclude SARS-CoV-2 infection  and should not be used as the sole basis for treatment or other  patient management decisions.  A negative result may occur with  improper specimen collection / handling, submission of specimen other  than nasopharyngeal swab, presence of viral mutation(s) within the  areas targeted by this assay, and inadequate number of viral copies  (<250 copies / mL). A negative result must be combined with clinical  observations, patient history, and epidemiological information. If result is POSITIVE SARS-CoV-2 target nucleic acids are DETECTED. The SARS-CoV-2 RNA is generally detectable in upper and  lower  respiratory specimens dur ing the acute phase of infection.  Positive  results are indicative of active infection with SARS-CoV-2.  Clinical  correlation with patient history and other diagnostic information is  necessary to determine patient infection status.  Positive results do  not rule out bacterial infection or co-infection with other viruses. If result is PRESUMPTIVE POSTIVE SARS-CoV-2 nucleic acids MAY BE PRESENT.   A presumptive positive result was  obtained on the submitted specimen  and confirmed on repeat testing.  While 2019 novel coronavirus  (SARS-CoV-2) nucleic acids may be present in the submitted sample  additional confirmatory testing may be necessary for epidemiological  and / or clinical management purposes  to differentiate between  SARS-CoV-2 and other Sarbecovirus currently known to infect humans.  If clinically indicated additional testing with an alternate test  methodology [phone]) is advised. The SARS-CoV-2 RNA is generally  detectable in upper and lower respiratory sp ecimens during the acute  phase of infection. The expected result is Negative. Fact Sheet for Patients:  StrictlyIdeas.no Fact Sheet for Healthcare Providers: BankingDealers.co.za This test is not yet approved or cleared by the [place] FDA and has been authorized for detection and/or diagnosis of SARS-CoV-2 by FDA under an Emergency Use Authorization (EUA).  This EUA will remain in effect (meaning this test can be used) for the duration of the COVID-19 declaration under Section 564(b)(1) of the Act, 21 U.S.C. section 360bbb-3(b)(1), unless the authorization is terminated or revoked sooner. Performed at [facility], [place] [address], [place], [place] [address]   Culture, blood (routine x 2)     Status: Abnormal   Collection Time: 06/10/19  5:28 PM   Specimen: Left Antecubital; Blood  Result Value Ref Range Status    Specimen Description   Final    LEFT ANTECUBITAL Performed at [facility] [address], [place], [place] [address]    Special Requests   Final    BOTTLES DRAWN AEROBIC AND ANAEROBIC Blood Culture adequate volume Performed at [place] [address], [place], [place] [address]    Culture  Setup Time   Final    GRAM NEGATIVE RODS IN BOTH AEROBIC AND ANAEROBIC BOTTLES CRITICAL RESULT CALLED TO, READ BACK BY AND VERIFIED WITH: [place] [phone] [id number] FCP Performed at [facility], [place] [address] [place], [place] [address]    Culture ENTEROBACTER CLOACAE (A)  Final   Report Status 06/13/2019 FINAL  Final   Organism ID, Bacteria ENTEROBACTER CLOACAE  Final      Susceptibility   Enterobacter cloacae - MIC*    CEFAZOLIN >=64 RESISTANT Resistant     CEFEPIME 2 SENSITIVE Sensitive     CEFTAZIDIME >=64 RESISTANT Resistant     CEFTRIAXONE >=64 RESISTANT Resistant     CIPROFLOXACIN <=0.25 SENSITIVE Sensitive     GENTAMICIN <=1 SENSITIVE Sensitive     IMIPENEM <=0.25 SENSITIVE Sensitive     TRIMETH/SULFA <=20 SENSITIVE Sensitive     PIP/TAZO >=128 RESISTANT Resistant     * ENTEROBACTER CLOACAE  Blood Culture ID Panel (Reflexed)     Status: Abnormal   Collection Time: 06/10/19  5:28 PM  Result Value Ref Range Status   Enterococcus species NOT DETECTED NOT DETECTED Final   Listeria monocytogenes NOT DETECTED NOT DETECTED Final   Staphylococcus species NOT DETECTED NOT DETECTED Final   Staphylococcus aureus (BCID) NOT DETECTED NOT DETECTED Final   Streptococcus species NOT DETECTED NOT DETECTED Final   Streptococcus agalactiae NOT DETECTED NOT DETECTED Final   Streptococcus pneumoniae NOT DETECTED NOT DETECTED Final   Streptococcus pyogenes NOT DETECTED NOT DETECTED Final   Acinetobacter baumannii NOT DETECTED NOT DETECTED Final   Enterobacteriaceae species DETECTED (A) NOT DETECTED Final    Comment: Enterobacteriaceae represent  a large family of gram-negative bacteria, not a single organism. CRITICAL RESULT CALLED TO, READ BACK BY AND VERIFIED WITH: [place] [id number]  FCP    Enterobacter cloacae complex DETECTED (A) NOT DETECTED Final    Comment: CRITICAL RESULT CALLED TO, READ BACK BY AND VERIFIED WITH: [place] [id number] FCP    Escherichia coli NOT DETECTED NOT DETECTED Final   Klebsiella oxytoca NOT DETECTED NOT DETECTED Final   Klebsiella pneumoniae NOT DETECTED NOT DETECTED Final   Proteus species NOT DETECTED NOT DETECTED Final   Serratia marcescens NOT DETECTED NOT DETECTED Final   Carbapenem resistance NOT DETECTED NOT DETECTED Final   Haemophilus influenzae NOT DETECTED NOT DETECTED Final   Neisseria meningitidis NOT DETECTED NOT DETECTED Final   Pseudomonas aeruginosa NOT DETECTED NOT DETECTED Final   Candida albicans NOT DETECTED NOT DETECTED Final   Candida glabrata NOT DETECTED NOT DETECTED Final   Candida krusei NOT DETECTED NOT DETECTED Final   Candida parapsilosis NOT DETECTED NOT DETECTED Final   Candida tropicalis NOT DETECTED NOT DETECTED Final    Comment: Performed at [facility], [place] [address] [place], [place] [address]  Culture, blood (routine x 2)     Status: Abnormal   Collection Time: 06/10/19  5:33 PM   Specimen: BLOOD LEFT HAND  Result Value Ref Range Status   Specimen Description   Final    BLOOD LEFT HAND Performed at [place] [address] [place], [place] [address]    Special Requests   Final    BOTTLES DRAWN AEROBIC AND ANAEROBIC Blood Culture adequate volume Performed at [place] [address] [place], [place] [address]    Culture  Setup Time   Final    GRAM NEGATIVE RODS IN BOTH AEROBIC AND ANAEROBIC BOTTLES CRITICAL VALUE NOTED.  VALUE IS CONSISTENT WITH PREVIOUSLY REPORTED AND CALLED VALUE.    Culture (A)  Final    ENTEROBACTER CLOACAE SUSCEPTIBILITIES PERFORMED ON PREVIOUS CULTURE  WITHIN THE LAST 5 DAYS. Performed at [facility], [place] [address] [place], [place] [address]    Report Status 06/13/2019 FINAL  Final  MRSA PCR Screening     Status: None   Collection Time: 06/10/19 10:46 PM   Specimen: Nasal Mucosa; Nasopharyngeal  Result Value Ref Range Status   MRSA by PCR NEGATIVE NEGATIVE Final    Comment:        The GeneXpert MRSA Assay (FDA approved for NASAL specimens only), is one component of a comprehensive MRSA colonization surveillance program. It is not intended to diagnose MRSA infection nor to guide or monitor treatment for MRSA infections. Performed at [facility], [place] [address], [place], [place] [address]   Urine culture     Status: Abnormal   Collection Time: 06/11/19 12:00 AM   Specimen: Urine, Random  Result Value Ref Range Status   Specimen Description   Final    URINE, RANDOM Performed at [facility] [address], [place], [place] [address]    Special Requests   Final    NONE Performed at [facility], [place] [address] [place], [place] [address]    Culture (A)  Final    <10,000 COLONIES/mL INSIGNIFICANT GROWTH Performed at [place] [address], [place], [place] [address]    Report Status 06/12/2019 FINAL  Final      Radiology Studies: No results found.    Scheduled Meds: . ALPRAZolam  0.5 mg Oral QHS  . budesonide  0.25 mg Nebulization BID  . chlorhexidine  15 mL Mouth Rinse BID  .  Chlorhexidine Gluconate Cloth  6 each Topical Daily  . ciprofloxacin  500 mg Oral BID  . furosemide  40 mg Oral Daily  . ipratropium-albuterol  3 mL Nebulization Q4H  . mouth rinse  15 mL Mouth Rinse q12n4p  . metoprolol tartrate  25 mg Oral BID  . pantoprazole  40 mg Oral Daily  . simvastatin  20 mg Oral q1800  . sodium chloride flush  10-40 mL Intracatheter Q12H  . sodium chloride flush  3 mL Intravenous Q12H  . vitamin B-12  1,000 mcg Oral Daily    Continuous Infusions:    LOS: 5 days      Time spent: 30 minutes   Dessa Phi, DO [facility] [email or web address] 06/15/2019, 12:17 PM

## 2019-06-16 LAB — CBC
HCT: 25.3 % — ABNORMAL LOW (ref 39.0–52.0)
Hemoglobin: 8 g/dL — ABNORMAL LOW (ref 13.0–17.0)
MCH: 30.3 pg (ref 26.0–34.0)
MCHC: 31.6 g/dL (ref 30.0–36.0)
MCV: 95.8 fL (ref 80.0–100.0)
Platelets: 132 10*3/uL — ABNORMAL LOW (ref 150–400)
RBC: 2.64 MIL/uL — ABNORMAL LOW (ref 4.22–5.81)
RDW: 16 % — ABNORMAL HIGH (ref 11.5–15.5)
WBC: 8.4 10*3/uL (ref 4.0–10.5)
nRBC: 0 % (ref 0.0–0.2)

## 2019-06-16 LAB — HEMOGLOBIN AND HEMATOCRIT, BLOOD
HCT: 26.1 % — ABNORMAL LOW (ref 39.0–52.0)
Hemoglobin: 8.5 g/dL — ABNORMAL LOW (ref 13.0–17.0)

## 2019-06-16 LAB — PREPARE RBC (CROSSMATCH)

## 2019-06-16 MED ORDER — SODIUM CHLORIDE 0.9% IV SOLUTION
Freq: Once | INTRAVENOUS | Status: AC
  Administered 2019-06-16: 09:00:00 via INTRAVENOUS

## 2019-06-16 MED ORDER — CIPROFLOXACIN HCL 500 MG PO TABS: 500.0000 mg | ORAL_TABLET | Freq: Two times a day (BID) | ORAL | 0 refills | Status: DC

## 2019-06-16 NOTE — Progress Notes (Signed)
  PROGRESS NOTE  Patient was supposed to discharge home after blood transfusion this morning.  However, I was notified by RN that patient spiked a fever 101 after completion of blood transfusion.  Hold off on discharge plan for now.  Also discussed with wife.  Dessa Phi, DO [facility] [email or web address] 06/16/2019, 1:00 PM

## 2019-06-16 NOTE — Discharge Summary (Signed)
Physician Discharge Summary  [name] QJF:[id number] DOB: 04/21/1946 DOA: 06/10/2019  PCP: [name], MD  Admit date: 06/10/2019 Discharge date: 06/16/2019  Admitted From: Home Disposition:  Home  Recommendations for Outpatient Follow-up:  1. Follow up with PCP in 1 week 2. Continue to follow with outpatient palliative care services 3. Follow-up with final blood culture and urine culture result obtained 7/31  Home Health: PT    Discharge Condition: Stable CODE STATUS: DNR  Diet recommendation: Heart healthy   Brief/Interim Summary: Andrew Neto Wickeris a 73 y.o.malewith medical history significant formetastatic bladder cancer, COPD with chronic respiratory failure on 6 L supplemental O2 via [place], CAD s/p PCI with DES, chronic diastolic CHF, hypertension, hyperlipidemia, anemia of chronic disease, anxiety, and OSA on BiPAP whopresents to the ED for evaluation of dyspnea.  Patient was recently admitted from 05/26/2019-06/02/2019 at which time he was treated for hypoxic respiratory failure secondary to COPD exacerbation. He was also found to have sepsis due to E. coli bacteremia felt secondary to necrotic bladder tumor.  Patient was feeling well until today when he developed new onset shortness of breath when he got up to go outside of his house. He had noticed some wheezing as well. He states that he felt his lungs were getting tight and he denies using home nebulizer treatment and time therefore called EMS. He denies any chest pain, palpitations, dysuria. He has abdominal pain from his bladder tumor. He reports swelling in both of his legs which he feels is stable.  Per ED documentation patient was on his home 6 L O2 via [place] with O2 saturation in the 80s on EMS arrival. He was placed on BiPAP with relief. When taken off BiPAP he again became very short of breath. He was also noted to have a temperature 102 Fahrenheit. He was subsequently brought to the ED for further  evaluation.  Patient was seen by hospital service admitted to SDU and treated for acute on chronic respiratory failure with COPD exacerbation, sepsis with bacteremia gram-negative rods,and elevated troponins with no interventional recommendations by cardiology.   Patient was treated for COPD exacerbation and weaned off of BiPAP.  He was also treated with cefepime for Enterobacter bacteremia and transition to oral Cipro.  He received numerous blood transfusions during hospitalization.  On day of discharge, he was feeling well, eager to go home, oxygen use back to his baseline.  He did have a fever on 7/31 which resolved.  Patient will remain on antibiotics at time of discharge.  Discharge Diagnoses:  Principal Problem:   Acute on chronic respiratory failure (HCC) Active Problems:   Malignant neoplasm of bladder (HCC)   Anxiety   Essential hypertension   End stage COPD (HCC)   OSA treated with BiPAP   Chronic diastolic CHF (congestive heart failure) (HCC)   Anemia of chronic disease   Sepsis ([place])   Acute on chronic respiratory failuredue to exacerbation of COPD -Now off BiPAP, back on his baseline 6 L nasal cannula O2  Sepsis secondary to Enterobacter bacteremia -Recent E. coli bacteremia now presenting with fever, leukocytosis, tachypnea, tachycardia. Urinalysis negative, however obtained after patient received antibiotics -Blood culture sensitive to cefepime, Cipro -Now on Cipro -Repeat blood culture and urine culture obtained 7/31.  Please follow-up outpatient  Elevated troponin in setting of CADs/p PCI w/ DES in 2008 -Suspectdemand injuryfrom respiratory failure and sepsis as above. Per EDP discussion with cardiology he is not aninterventional candidate. 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 Status post radiation therapy, neurosurgery and chemotherapy per patient. Follows with palliative care.   Anemia of chronic disease -Transfused total 3 unit packed red blood cell this admission  Hypertension -Continue Metoprolol  Hyperlipidemia -Continue simvastatin  Anxiety -Continue home Xanax as needed  OSA -Continue BiPAP nightly          Discharge Instructions  Discharge Instructions    (HEART FAILURE PATIENTS) Call MD:  Anytime you have any of the following symptoms: 1) 3 pound weight gain in 24 hours or 5 pounds in 1 week 2) shortness of breath, with or without a dry hacking cough 3) swelling in the hands, feet or stomach 4) if you have to sleep on extra pillows at night in order to breathe.   Complete by: As directed    Call MD for:  difficulty breathing, headache or visual disturbances   Complete by: As directed    Call MD for:  extreme fatigue   Complete by: As directed    Call MD for:  persistant dizziness or light-headedness   Complete by: As directed    Call MD for:  persistant nausea and vomiting   Complete by: As directed    Call MD for:  severe uncontrolled pain   Complete by: As directed    Call MD for:  temperature >100.4   Complete by: As directed    Diet - low sodium heart healthy   Complete by: As directed    Discharge instructions   Complete by: As directed    You were cared for by a hospitalist during your hospital stay. If you have any questions about your discharge medications or the care you received while you were in the hospital after you are discharged, you can call the unit and ask to speak with the hospitalist on call if the hospitalist that took care of you is not available. Once you are discharged, your primary care physician will handle any further medical issues. Please note that NO  REFILLS for any discharge medications will be authorized once you are discharged, as it is imperative that you return to your primary care physician (or establish a relationship with a primary care physician if you do not have one) for your aftercare needs so that they can reassess your need for medications and monitor your lab values.   Increase activity slowly   Complete by: As directed      Allergies as of 06/16/2019      Reactions   Amlodipine Swelling      Medication List    STOP taking these medications   predniSONE 10 MG tablet Commonly known as: DELTASONE     TAKE these medications   Albuterol Sulfate 108 (90 Base) MCG/ACT Aepb Commonly known as: ProAir RespiClick Inhale 2 puffs into the lungs every 6 (six) hours as needed (shortness of breath).   ALPRAZolam 0.5 MG tablet Commonly known as: XANAX Take 1 tablet (0.5 mg total) by mouth at bedtime.   budesonide 0.25 MG/2ML nebulizer solution Commonly known as: PULMICORT Take 2 mLs (0.25 mg total) by nebulization 2 (two) times daily.   cholecalciferol 10 MCG (400 UNIT) Tabs tablet Commonly known as: VITAMIN D3 Take 400 Units by mouth at bedtime.   ciprofloxacin 500 MG tablet Commonly known  as: CIPRO Take 1 tablet (500 mg total) by mouth 2 (two) times daily for 7 days.   FLAX SEEDS PO Take 1 capsule by mouth daily.   fluticasone 50 MCG/ACT nasal spray Commonly known as: FLONASE Place 2 sprays into both nostrils daily as needed for allergies or rhinitis.   furosemide 40 MG tablet Commonly known as: LASIX Take 40 mg by mouth daily as needed for fluid.   guaiFENesin 600 MG 12 hr tablet Commonly known as: MUCINEX Take 1,200 mg by mouth 2 (two) times daily.   ipratropium-albuterol 0.5-2.5 (3) MG/3ML Soln Commonly known as: DUONEB Take 3 mLs by nebulization every 4 (four) hours as needed. What changed:   when to take this  additional instructions   methocarbamol 500 MG tablet Commonly known as: ROBAXIN Take  1,000 mg by mouth at bedtime.   metoprolol tartrate 25 MG tablet Commonly known as: LOPRESSOR Take 25 mg by mouth 2 (two) times a day.   multivitamin-lutein Caps capsule Take 1 capsule by mouth 2 (two) times a day.   nitroGLYCERIN 0.4 MG SL tablet Commonly known as: NITROSTAT Place 1 tablet (0.4 mg total) under the tongue every 5 (five) minutes as needed for chest pain.   ondansetron 8 MG disintegrating tablet Commonly known as: ZOFRAN-ODT Take 8 mg by mouth every 8 (eight) hours as needed for nausea/vomiting.   pantoprazole 40 MG tablet Commonly known as: PROTONIX Take 40 mg by mouth daily.   simvastatin 20 MG tablet Commonly known as: ZOCOR Take 1 tablet (20 mg total) by mouth daily at 6 PM.   sodium chloride 0.65 % Soln nasal spray Commonly known as: OCEAN Place 1 spray into both nostrils as needed for congestion. What changed: when to take this   SYSTANE OP Apply 1 drop to eye every 6 (six) hours as needed (dry eyes).   traMADol 50 MG tablet Commonly known as: ULTRAM Take 1 tablet (50 mg total) by mouth every 6 (six) hours as needed for moderate pain.   vitamin B-12 1000 MCG tablet Commonly known as: CYANOCOBALAMIN Take 1,000 mcg by mouth daily.   vitamin C 1000 MG tablet Take 1,000 mg by mouth daily.      Follow-up Information    [name], MD. Schedule an appointment as soon as possible for a visit.   Specialty: Internal Medicine Contact information: [place] [address] [phone]          Allergies  Allergen Reactions  . Amlodipine Swelling    Consultations:  None    Procedures/Studies: Ct Angio Chest Pe W And/or Wo Contrast  Result Date: 06/10/2019 CLINICAL DATA:  Shortness of breath, fever. 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 EXAM: CT ANGIOGRAPHY CHEST WITH CONTRAST TECHNIQUE: Multidetector CT imaging of the chest was performed using the standard protocol during  bolus administration of intravenous contrast. Multiplanar CT image reconstructions and MIPs were obtained to evaluate the vascular anatomy. CONTRAST:  153mL OMNIPAQUE IOHEXOL 350 MG/ML SOLN COMPARISON:  Chest radiograph dated 06/10/2019. CTA chest dated 05/27/2019. FINDINGS: Motion degraded images. Cardiovascular: Satisfactory opacification of the bilateral pulmonary arteries to the segmental level. No evidence pulmonary embolism. No evidence of thoracic aortic aneurysm or dissection. Atherosclerotic calcifications of the aortic arch. The heart is normal in size.  No pericardial effusion. 3 vessel coronary atherosclerosis. Mediastinum/Nodes: No suspicious mediastinal lymphadenopathy. Suspected 18 mm right isthmic thyroid nodule (series 4/image 11), not well visualized on the prior study, although of questionable clinical significance in  this patient. Lungs/Pleura: Multiple bilateral pulmonary metastases, unchanged from recent prior, although better evaluated on the current study due to reduced motion degradation. Index lesions are as follows: --21 mm right upper lobe nodule (series 6/image 39), previously measured as 19 mm --24 mm superior segment left lower lobe nodule (series 6/image 55), previously measured as 24 mm --27 mm central right lower lobe nodule (series 6/image 109), previously measured as 35 mm Extensive centrilobular and paraseptal emphysematous changes, upper lobe predominant. No focal consolidation. No pleural effusion or pneumothorax. Upper Abdomen: Embolization coils within the left hepatic lobe with adjacent incompletely visualized treated left hepatic lobe lesion (series 4/image 88). Additional poorly evaluated lesion in the central hepatic dome (series 4/image 78). Cholelithiasis. 6.5 cm right renal cyst. The superior aspect of the known large right abdominal mass is incompletely visualized (series 4/image 111). Musculoskeletal: Degenerative changes of the visualized thoracolumbar spine. Review  of the MIP images confirms the above findings. IMPRESSION: No evidence of pulmonary embolism. Bilateral pulmonary metastases, better evaluate than on the recent prior, likely grossly unchanged. Large right abdominal mass is incompletely visualized. Hepatic metastases, poorly evaluated. Additional ancillary findings as above. Aortic Atherosclerosis (ICD10-I70.0) and Emphysema (ICD10-J43.9). Electronically Signed   By: Julian Hy M.D.   On: 06/10/2019 21:10   Dg Chest Port 1 View  Result Date: 06/15/2019 CLINICAL DATA:  Fever EXAM: PORTABLE CHEST 1 VIEW COMPARISON:  06/13/2019.  Chest CT 06/10/2019. FINDINGS: There is hyperinflation of the lungs compatible with COPD. Bilateral pulmonary nodules/masses compatible with metastases as seen on prior imaging. No acute confluent airspace opacities or effusions. Heart is normal size. IMPRESSION: COPD.  Stable pulmonary metastases. No acute findings. Electronically Signed   By: Rolm Baptise M.D.   On: 06/15/2019 19:52   Dg Chest Port 1 View  Result Date: 06/13/2019 CLINICAL DATA:  Shortness of breath and fever. History of metastatic sarcoma. EXAM: PORTABLE CHEST 1 VIEW COMPARISON:  Multiple previous recent chest x-rays and chest CTs. FINDINGS: The cardiac silhouette, mediastinal and hilar contours are within normal limits and stable. Stable advanced emphysematous changes and pulmonary scarring. Stable pulmonary metastatic lesions. Stable basilar compressive atelectasis and vascular crowding. No pleural effusions. IMPRESSION: Stable emphysematous changes, pulmonary scarring and metastatic pulmonary nodules. No acute overlying pulmonary process. Electronically Signed   By: Marijo Sanes M.D.   On: 06/13/2019 08:13   Dg Chest Port 1 View  Result Date: 06/10/2019 CLINICAL DATA:  Shortness of breath, fever, history bladder cancer, emphysema, COPD, former smoker, coronary artery disease, hypertension EXAM: PORTABLE CHEST 1 VIEW COMPARISON:  Portable exam 1723  hours compared to 05/26/2019 and correlated with CT chest of 05/27/2019 FINDINGS: Normal heart size, mediastinal contours, and pulmonary vascularity. Atherosclerotic calcification aorta. Emphysematous and minimal bronchitic changes consistent with COPD. BILATERAL pulmonary nodules consistent with metastases. Bibasilar atelectasis versus fibrosis unchanged. No acute infiltrate, pleural effusion, or pneumothorax. No acute osseous findings. IMPRESSION: Changes of COPD with bibasilar chronic interstitial lung disease/fibrosis versus atelectasis. BILATERAL pulmonary metastases. No acute abnormalities. Electronically Signed   By: Lavonia Dana M.D.   On: 06/10/2019 18:15    Discharge Exam: Vitals:   06/16/19 0935 06/16/19 1013  BP: (!) 110/54 (!) 110/45  Pulse: 92 92  Resp: (!) 29 (!) 21  Temp: 99.5 F (37.5 C) 99.7 F (37.6 C)  SpO2: 96% 100%     General: Pt is alert, awake, not in acute distress Cardiovascular: RRR, S1/S2 +, no rubs, no gallops, Respiratory: CTA bilaterally, no wheezing, no rhonchi, no distress or  conversational dyspnea, on 6 L nasal cannula O2 Abdominal: Soft, NT, ND, bowel sounds + Extremities:  +1 bilateral edema, no cyanosis    The results of significant diagnostics from this hospitalization (including imaging, microbiology, ancillary and laboratory) are listed below for reference.     Microbiology: Recent Results (from the past 240 hour(s))  SARS Coronavirus 2 (CEPHEID- Performed in [facility] hospital lab), Hosp Order     Status: None   Collection Time: 06/10/19  5:28 PM   Specimen: Nasopharyngeal Swab  Result Value Ref Range Status   SARS Coronavirus 2 NEGATIVE NEGATIVE Final    Comment: (NOTE) If result is NEGATIVE SARS-CoV-2 target nucleic acids are NOT DETECTED. The SARS-CoV-2 RNA is generally detectable in upper and lower  respiratory specimens during the acute phase of infection. The lowest  concentration of SARS-CoV-2 viral copies this assay can detect  is 250  copies / mL. A negative result does not preclude SARS-CoV-2 infection  and should not be used as the sole basis for treatment or other  patient management decisions.  A negative result may occur with  improper specimen collection / handling, submission of specimen other  than nasopharyngeal swab, presence of viral mutation(s) within the  areas targeted by this assay, and inadequate number of viral copies  (<250 copies / mL). A negative result must be combined with clinical  observations, patient history, and epidemiological information. If result is POSITIVE SARS-CoV-2 target nucleic acids are DETECTED. The SARS-CoV-2 RNA is generally detectable in upper and lower  respiratory specimens dur ing the acute phase of infection.  Positive  results are indicative of active infection with SARS-CoV-2.  Clinical  correlation with patient history and other diagnostic information is  necessary to determine patient infection status.  Positive results do  not rule out bacterial infection or co-infection with other viruses. If result is PRESUMPTIVE POSTIVE SARS-CoV-2 nucleic acids MAY BE PRESENT.   A presumptive positive result was obtained on the submitted specimen  and confirmed on repeat testing.  While 2019 novel coronavirus  (SARS-CoV-2) nucleic acids may be present in the submitted sample  additional confirmatory testing may be necessary for epidemiological  and / or clinical management purposes  to differentiate between  SARS-CoV-2 and other Sarbecovirus currently known to infect humans.  If clinically indicated additional testing with an alternate test  methodology [phone]) is advised. The SARS-CoV-2 RNA is generally  detectable in upper and lower respiratory sp ecimens during the acute  phase of infection. The expected result is Negative. Fact Sheet for Patients:  StrictlyIdeas.no Fact Sheet for Healthcare  Providers: BankingDealers.co.za This test is not yet approved or cleared by the [place] FDA and has been authorized for detection and/or diagnosis of SARS-CoV-2 by FDA under an Emergency Use Authorization (EUA).  This EUA will remain in effect (meaning this test can be used) for the duration of the COVID-19 declaration under Section 564(b)(1) of the Act, 21 U.S.C. section 360bbb-3(b)(1), unless the authorization is terminated or revoked sooner. Performed at [facility], [place] [address] [place], [place] [address]   Culture, blood (routine x 2)     Status: Abnormal   Collection Time: 06/10/19  5:28 PM   Specimen: Left Antecubital; Blood  Result Value Ref Range Status   Specimen Description   Final    LEFT ANTECUBITAL Performed at [facility] [address], [place], [place] [address]    Special Requests   Final    BOTTLES DRAWN AEROBIC AND  ANAEROBIC Blood Culture adequate volume Performed at [facility] [address], [place], [place] [address]    Culture  Setup Time   Final    GRAM NEGATIVE RODS IN BOTH AEROBIC AND ANAEROBIC BOTTLES CRITICAL RESULT CALLED TO, READ BACK BY AND VERIFIED WITH: Ar[place] [phone] FCP Performed at [facility], [place] [address] [place], [place] [address]    Culture ENTEROBACTER CLOACAE (A)  Final   Report Status 06/13/2019 FINAL  Final   Organism ID, Bacteria ENTEROBACTER CLOACAE  Final      Susceptibility   Enterobacter cloacae - MIC*    CEFAZOLIN >=64 RESISTANT Resistant     CEFEPIME 2 SENSITIVE Sensitive     CEFTAZIDIME >=64 RESISTANT Resistant     CEFTRIAXONE >=64 RESISTANT Resistant     CIPROFLOXACIN <=0.25 SENSITIVE Sensitive     GENTAMICIN <=1 SENSITIVE Sensitive     IMIPENEM <=0.25 SENSITIVE Sensitive     TRIMETH/SULFA <=20 SENSITIVE Sensitive     PIP/TAZO >=128 RESISTANT Resistant     * ENTEROBACTER CLOACAE  Blood Culture ID Panel  (Reflexed)     Status: Abnormal   Collection Time: 06/10/19  5:28 PM  Result Value Ref Range Status   Enterococcus species NOT DETECTED NOT DETECTED Final   Listeria monocytogenes NOT DETECTED NOT DETECTED Final   Staphylococcus species NOT DETECTED NOT DETECTED Final   Staphylococcus aureus (BCID) NOT DETECTED NOT DETECTED Final   Streptococcus species NOT DETECTED NOT DETECTED Final   Streptococcus agalactiae NOT DETECTED NOT DETECTED Final   Streptococcus pneumoniae NOT DETECTED NOT DETECTED Final   Streptococcus pyogenes NOT DETECTED NOT DETECTED Final   Acinetobacter baumannii NOT DETECTED NOT DETECTED Final   Enterobacteriaceae species DETECTED (A) NOT DETECTED Final    Comment: Enterobacteriaceae represent a large family of gram-negative bacteria, not a single organism. CRITICAL RESULT CALLED TO, READ BACK BY AND VERIFIED WITH: [place] [phone] FCP    Enterobacter cloacae complex DETECTED (A) NOT DETECTED Final    Comment: CRITICAL RESULT CALLED TO, READ BACK BY AND VERIFIED WITH: [place] [phone] FCP    Escherichia coli NOT DETECTED NOT DETECTED Final   Klebsiella oxytoca NOT DETECTED NOT DETECTED Final   Klebsiella pneumoniae NOT DETECTED NOT DETECTED Final   Proteus species NOT DETECTED NOT DETECTED Final   Serratia marcescens NOT DETECTED NOT DETECTED Final   Carbapenem resistance NOT DETECTED NOT DETECTED Final   Haemophilus influenzae NOT DETECTED NOT DETECTED Final   Neisseria meningitidis NOT DETECTED NOT DETECTED Final   Pseudomonas aeruginosa NOT DETECTED NOT DETECTED Final   Candida albicans NOT DETECTED NOT DETECTED Final   Candida glabrata NOT DETECTED NOT DETECTED Final   Candida krusei NOT DETECTED NOT DETECTED Final   Candida parapsilosis NOT DETECTED NOT DETECTED Final   Candida tropicalis NOT DETECTED NOT DETECTED Final    Comment: Performed at [facility], [place] [address], [place], [place] [address]  Culture, blood (routine  x 2)     Status: Abnormal   Collection Time: 06/10/19  5:33 PM   Specimen: BLOOD LEFT HAND  Result Value Ref Range Status   Specimen Description   Final    BLOOD LEFT HAND Performed at [facility] [address], [place], [place] [address]    Special Requests   Final    BOTTLES DRAWN AEROBIC AND ANAEROBIC Blood Culture adequate volume Performed at [place] [facility] [address] [place],  [place] [address]    Culture  Setup Time   Final    GRAM NEGATIVE RODS IN BOTH AEROBIC AND ANAEROBIC BOTTLES CRITICAL VALUE NOTED.  VALUE IS CONSISTENT WITH PREVIOUSLY REPORTED AND CALLED VALUE.    Culture (A)  Final    ENTEROBACTER CLOACAE SUSCEPTIBILITIES PERFORMED ON PREVIOUS CULTURE WITHIN THE LAST 5 DAYS. Performed at [facility], [place] [address], [place], [place] [address]    Report Status 06/13/2019 FINAL  Final  MRSA PCR Screening     Status: None   Collection Time: 06/10/19 10:46 PM   Specimen: Nasal Mucosa; Nasopharyngeal  Result Value Ref Range Status   MRSA by PCR NEGATIVE NEGATIVE Final    Comment:        The GeneXpert MRSA Assay (FDA approved for NASAL specimens only), is one component of a comprehensive MRSA colonization surveillance program. It is not intended to diagnose MRSA infection nor to guide or monitor treatment for MRSA infections. Performed at [facility], [place] [address], [place], [place] [address]   Urine culture     Status: Abnormal   Collection Time: 06/11/19 12:00 AM   Specimen: Urine, Random  Result Value Ref Range Status   Specimen Description   Final    URINE, RANDOM Performed at [facility] [address] [place], [place] [address]    Special Requests   Final    NONE Performed at [facility], [place] [address], [place], [place] [address]    Culture (A)  Final    <10,000 COLONIES/mL INSIGNIFICANT GROWTH Performed at [facility] [address], [place], [place] [address]    Report Status 06/12/2019 FINAL  Final  Culture, blood (routine x 2)     Status: None (Preliminary result)   Collection Time: 06/15/19  4:38 PM   Specimen: BLOOD RIGHT ARM  Result Value Ref Range Status   Specimen Description   Final    BLOOD RIGHT ARM Performed at [facility] [address] [place], [place] [address]    Special Requests   Final    BOTTLES DRAWN AEROBIC ONLY Blood Culture adequate volume Performed at [facility] [address], [place], [place] [address]    Culture   Final    NO GROWTH < 12 HOURS Performed at [facility] [address] [place], [place] [address]    Report Status PENDING  Incomplete  Culture, blood (routine x 2)     Status: None (Preliminary result)   Collection Time: 06/15/19  4:38 PM   Specimen: BLOOD RIGHT HAND  Result Value Ref Range Status   Specimen Description   Final    BLOOD RIGHT HAND Performed at [facility] [address], [place], [place] [address]    Special Requests   Final    BOTTLES DRAWN AEROBIC ONLY Blood Culture results may not be optimal due to an inadequate volume of blood received in culture bottles Performed at [place] [address] [address], [place], [place] [address]    Culture   Final    NO GROWTH < 12 HOURS Performed at [facility] [address] [place], [place] [address]    Report Status PENDING  Incomplete     Labs: BNP (last 3 results) Recent Labs    05/06/19 0101 05/26/19 2327 06/10/19 1727  BNP 330.7* 186.8* 301.6*   Basic Metabolic Panel: Recent Labs  Lab 06/11/19 0556  06/12/19 0819 06/13/19 0251 06/14/19 0525 06/15/19 0429  NA 132* 131* 130* 130* 128*  K 4.7 4.3 3.9 3.7 3.8  CL 97* 95* 94* 94* 90*  CO2 25 26 26 27 29   GLUCOSE 291* 192* 175* 197* 163*  BUN 24* 24* 24* 23 22  CREATININE 0.79 0.84 0.94 0.94 0.96  CALCIUM 7.8* 8.1* 7.8* 7.9* 8.0*   Liver  Function Tests: No results for input(s): AST, ALT, ALKPHOS, BILITOT, PROT, ALBUMIN in the last 168 hours. No results for input(s): LIPASE, AMYLASE in the last 168 hours. No results for input(s): AMMONIA in the last 168 hours. CBC: Recent Labs  Lab 06/10/19 1904  06/12/19 0819 06/13/19 0251 06/14/19 0525 06/14/19 1500 06/15/19 0429 06/16/19 0733  WBC 16.0*   < > 10.6* 11.3* 9.7  --  7.6 8.4  NEUTROABS 14.8*  --  9.3* 9.8*  --   --   --   --   HGB 8.0*   < > 9.2* 8.2* 7.7* 8.7* 8.1* 8.0*  HCT 24.6*   < > 28.2* 25.9* 23.5* 26.6* 25.3* 25.3*  MCV 95.0   < > 93.7 93.8 94.4  --  94.4 95.8  PLT 138*   < > 135* 121* 117*  --  120* 132*   < > = values in this interval not displayed.   Cardiac Enzymes: No results for input(s): CKTOTAL, CKMB, CKMBINDEX, TROPONINI in the last 168 hours. BNP: Invalid input(s): POCBNP CBG: No results for input(s): GLUCAP in the last 168 hours. D-Dimer No results for input(s): DDIMER in the last 72 hours. Hgb A1c No results for input(s): HGBA1C in the last 72 hours. Lipid Profile No results for input(s): CHOL, HDL, LDLCALC, TRIG, CHOLHDL, LDLDIRECT in the last 72 hours. Thyroid function studies No results for input(s): TSH, T4TOTAL, T3FREE, THYROIDAB in the last 72 hours.  Invalid input(s): FREET3 Anemia work up No results for input(s): VITAMINB12, FOLATE, FERRITIN, TIBC, IRON, RETICCTPCT in the last 72 hours. Urinalysis    Component Value Date/Time   COLORURINE YELLOW 06/15/2019 1604   APPEARANCEUR CLEAR 06/15/2019 1604   LABSPEC 1.015 06/15/2019 1604   PHURINE 6.0 06/15/2019 1604   GLUCOSEU NEGATIVE 06/15/2019 [place] 06/30/2010 1050   HGBUR NEGATIVE 06/15/2019 [place] 06/15/2019 [place] 06/15/2019 1604   PROTEINUR 100 (A) 06/15/2019 1604   UROBILINOGEN 0.2 06/30/2010 1050   NITRITE NEGATIVE 06/15/2019 1604   LEUKOCYTESUR NEGATIVE 06/15/2019 1604   Sepsis Labs Invalid input(s):  PROCALCITONIN,  WBC,  LACTICIDVEN Microbiology Recent Results (from the past 240 hour(s))  SARS Coronavirus 2 (CEPHEID- Performed in [facility] hospital lab), Hosp Order     Status: None   Collection Time: 06/10/19  5:28 PM   Specimen: Nasopharyngeal Swab  Result Value Ref Range Status   SARS Coronavirus 2 NEGATIVE NEGATIVE Final    Comment: (NOTE) If result is NEGATIVE SARS-CoV-2 target nucleic acids are NOT DETECTED. The SARS-CoV-2 RNA is generally detectable in upper and lower  respiratory specimens during the acute phase of infection. The lowest  concentration of SARS-CoV-2 viral copies this assay can detect is 250  copies / mL. A negative result does not preclude SARS-CoV-2 infection  and should not be used as the sole basis for treatment or other  patient management decisions.  A negative result may occur with  improper specimen collection / handling, submission of specimen other  than nasopharyngeal swab, presence of viral mutation(s) within the  areas targeted by  this assay, and inadequate number of viral copies  (<250 copies / mL). A negative result must be combined with clinical  observations, patient history, and epidemiological information. If result is POSITIVE SARS-CoV-2 target nucleic acids are DETECTED. The SARS-CoV-2 RNA is generally detectable in upper and lower  respiratory specimens dur ing the acute phase of infection.  Positive  results are indicative of active infection with SARS-CoV-2.  Clinical  correlation with patient history and other diagnostic information is  necessary to determine patient infection status.  Positive results do  not rule out bacterial infection or co-infection with other viruses. If result is PRESUMPTIVE POSTIVE SARS-CoV-2 nucleic acids MAY BE PRESENT.   A presumptive positive result was obtained on the submitted specimen  and confirmed on repeat testing.  While 2019 novel coronavirus  (SARS-CoV-2) nucleic acids may be present in the  submitted sample  additional confirmatory testing may be necessary for epidemiological  and / or clinical management purposes  to differentiate between  SARS-CoV-2 and other Sarbecovirus currently known to infect humans.  If clinically indicated additional testing with an alternate test  methodology [phone]) is advised. The SARS-CoV-2 RNA is generally  detectable in upper and lower respiratory sp ecimens during the acute  phase of infection. The expected result is Negative. Fact Sheet for Patients:  StrictlyIdeas.no Fact Sheet for Healthcare Providers: BankingDealers.co.za This test is not yet approved or cleared by the [place] FDA and has been authorized for detection and/or diagnosis of SARS-CoV-2 by FDA under an Emergency Use Authorization (EUA).  This EUA will remain in effect (meaning this test can be used) for the duration of the COVID-19 declaration under Section 564(b)(1) of the Act, 21 U.S.C. section 360bbb-3(b)(1), unless the authorization is terminated or revoked sooner. Performed at [facility], [place] [address], [place], [place] [address]   Culture, blood (routine x 2)     Status: Abnormal   Collection Time: 06/10/19  5:28 PM   Specimen: Left Antecubital; Blood  Result Value Ref Range Status   Specimen Description   Final    LEFT ANTECUBITAL Performed at [facility] [address] [place], [place] [address]    Special Requests   Final    BOTTLES DRAWN AEROBIC AND ANAEROBIC Blood Culture adequate volume Performed at [facility] [address] [place], [place] [address]    Culture  Setup Time   Final    GRAM NEGATIVE RODS IN BOTH AEROBIC AND ANAEROBIC BOTTLES CRITICAL RESULT CALLED TO, READ BACK BY AND VERIFIED WITH: [place] [phone] FCP Performed at [facility], [place] [address], [place],  [address]    Culture ENTEROBACTER  CLOACAE (A)  Final   Report Status 06/13/2019 FINAL  Final   Organism ID, Bacteria ENTEROBACTER CLOACAE  Final      Susceptibility   Enterobacter cloacae - MIC*    CEFAZOLIN >=64 RESISTANT Resistant     CEFEPIME 2 SENSITIVE Sensitive     CEFTAZIDIME >=64 RESISTANT Resistant     CEFTRIAXONE >=64 RESISTANT Resistant     CIPROFLOXACIN <=0.25 SENSITIVE Sensitive     GENTAMICIN <=1 SENSITIVE Sensitive     IMIPENEM <=0.25 SENSITIVE Sensitive     TRIMETH/SULFA <=20 SENSITIVE Sensitive     PIP/TAZO >=128 RESISTANT Resistant     * ENTEROBACTER CLOACAE  Blood Culture ID Panel (Reflexed)     Status: Abnormal   Collection Time: 06/10/19  5:28 PM  Result Value Ref Range Status  Enterococcus species NOT DETECTED NOT DETECTED Final   Listeria monocytogenes NOT DETECTED NOT DETECTED Final   Staphylococcus species NOT DETECTED NOT DETECTED Final   Staphylococcus aureus (BCID) NOT DETECTED NOT DETECTED Final   Streptococcus species NOT DETECTED NOT DETECTED Final   Streptococcus agalactiae NOT DETECTED NOT DETECTED Final   Streptococcus pneumoniae NOT DETECTED NOT DETECTED Final   Streptococcus pyogenes NOT DETECTED NOT DETECTED Final   Acinetobacter baumannii NOT DETECTED NOT DETECTED Final   Enterobacteriaceae species DETECTED (A) NOT DETECTED Final    Comment: Enterobacteriaceae represent a large family of gram-negative bacteria, not a single organism. CRITICAL RESULT CALLED TO, READ BACK BY AND VERIFIED WITH: [place] [phone] FCP    Enterobacter cloacae complex DETECTED (A) NOT DETECTED Final    Comment: CRITICAL RESULT CALLED TO, READ BACK BY AND VERIFIED WITH: [place] [phone] FCP    Escherichia coli NOT DETECTED NOT DETECTED Final   Klebsiella oxytoca NOT DETECTED NOT DETECTED Final   Klebsiella pneumoniae NOT DETECTED NOT DETECTED Final   Proteus species NOT DETECTED NOT DETECTED Final   Serratia marcescens NOT DETECTED NOT DETECTED Final   Carbapenem  resistance NOT DETECTED NOT DETECTED Final   Haemophilus influenzae NOT DETECTED NOT DETECTED Final   Neisseria meningitidis NOT DETECTED NOT DETECTED Final   Pseudomonas aeruginosa NOT DETECTED NOT DETECTED Final   Candida albicans NOT DETECTED NOT DETECTED Final   Candida glabrata NOT DETECTED NOT DETECTED Final   Candida krusei NOT DETECTED NOT DETECTED Final   Candida parapsilosis NOT DETECTED NOT DETECTED Final   Candida tropicalis NOT DETECTED NOT DETECTED Final    Comment: Performed at [facility], [place] [address] [place], [place] [address]  Culture, blood (routine x 2)     Status: Abnormal   Collection Time: 06/10/19  5:33 PM   Specimen: BLOOD LEFT HAND  Result Value Ref Range Status   Specimen Description   Final    BLOOD LEFT HAND Performed at [facility] [address] [place], [place] [address]    Special Requests   Final    BOTTLES DRAWN AEROBIC AND ANAEROBIC Blood Culture adequate volume Performed at [facility] [address] [place], [place] [address]    Culture  Setup Time   Final    GRAM NEGATIVE RODS IN BOTH AEROBIC AND ANAEROBIC BOTTLES CRITICAL VALUE NOTED.  VALUE IS CONSISTENT WITH PREVIOUSLY REPORTED AND CALLED VALUE.    Culture (A)  Final    ENTEROBACTER CLOACAE SUSCEPTIBILITIES PERFORMED ON PREVIOUS CULTURE WITHIN THE LAST 5 DAYS. Performed at [facility], [place] [address], [place], [place] [address]    Report Status 06/13/2019 FINAL  Final  MRSA PCR Screening     Status: None   Collection Time: 06/10/19 10:46 PM   Specimen: Nasal Mucosa; Nasopharyngeal  Result Value Ref Range Status   MRSA by PCR NEGATIVE NEGATIVE Final    Comment:        The GeneXpert MRSA Assay (FDA approved for NASAL specimens only), is one component of a comprehensive MRSA colonization surveillance program. It is not intended to diagnose MRSA infection nor to guide or monitor treatment for MRSA infections. Performed  at [facility], [place] [address] [place], [place] [address]   Urine culture     Status: Abnormal   Collection Time: 06/11/19 12:00 AM   Specimen: Urine, Random  Result Value Ref Range Status   Specimen Description  Final    URINE, RANDOM Performed at [facility], [place] [address], [place], [place] [address]    Special Requests   Final    NONE Performed at [facility], [place] [address], [place], [place] [address]    Culture (A)  Final    <10,000 COLONIES/mL INSIGNIFICANT GROWTH Performed at [facility] [address] [place], [place] [address]    Report Status 06/12/2019 FINAL  Final  Culture, blood (routine x 2)     Status: None (Preliminary result)   Collection Time: 06/15/19  4:38 PM   Specimen: BLOOD RIGHT ARM  Result Value Ref Range Status   Specimen Description   Final    BLOOD RIGHT ARM Performed at [facility] [address], [place], [place] [address]    Special Requests   Final    BOTTLES DRAWN AEROBIC ONLY Blood Culture adequate volume Performed at [facility] [address], [place], [place] [address]    Culture   Final    NO GROWTH < 12 HOURS Performed at [facility] [address] [place], [place] [address]    Report Status PENDING  Incomplete  Culture, blood (routine x 2)     Status: None (Preliminary result)   Collection Time: 06/15/19  4:38 PM   Specimen: BLOOD RIGHT HAND  Result Value Ref Range Status   Specimen Description   Final    BLOOD RIGHT HAND Performed at [facility] [address] [place], [place] [address]    Special Requests   Final    BOTTLES DRAWN AEROBIC ONLY Blood Culture results may not be optimal due to an inadequate volume of blood received in culture bottles Performed at [facility] [address] [place], [place] [address]    Culture   Final    NO GROWTH < 12  HOURS Performed at [facility] [address], [place],  [address]    Report Status PENDING  Incomplete     Patient was seen and examined on the day of discharge and was found to be in stable condition. Time coordinating discharge: 25 minutes including assessment and coordination of care, as well as examination of the patient.   SIGNED:  Dessa Phi, DO [facility] [email or web address] 06/16/2019, 11:20 AM

## 2019-06-17 ENCOUNTER — Inpatient Hospital Stay (HOSPITAL_COMMUNITY): Payer: Medicare Other

## 2019-06-17 DIAGNOSIS — Z515 Encounter for palliative care: Secondary | ICD-10-CM

## 2019-06-17 DIAGNOSIS — Z7189 Other specified counseling: Secondary | ICD-10-CM

## 2019-06-17 LAB — BASIC METABOLIC PANEL
Anion gap: 13 (ref 5–15)
BUN: 31 mg/dL — ABNORMAL HIGH (ref 8–23)
CO2: 23 mmol/L (ref 22–32)
Calcium: 7.2 mg/dL — ABNORMAL LOW (ref 8.9–10.3)
Chloride: 93 mmol/L — ABNORMAL LOW (ref 98–111)
Creatinine, Ser: 1.84 mg/dL — ABNORMAL HIGH (ref 0.61–1.24)
GFR calc Af Amer: 41 mL/min — ABNORMAL LOW (ref >60)
GFR calc non Af Amer: 35 mL/min — ABNORMAL LOW (ref >60)
Glucose, Bld: 129 mg/dL — ABNORMAL HIGH (ref 70–99)
Potassium: 4.7 mmol/L (ref 3.5–5.1)
Sodium: 129 mmol/L — ABNORMAL LOW (ref 135–145)

## 2019-06-17 LAB — BPAM RBC
Blood Product Expiration Date: 202008212359
Blood Product Expiration Date: 202008242359
ISSUE DATE / TIME: 202007301048
ISSUE DATE / TIME: 202008010952
Unit Type and Rh: 6200
Unit Type and Rh: 6200

## 2019-06-17 LAB — CBC
HCT: 28.9 % — ABNORMAL LOW (ref 39.0–52.0)
Hemoglobin: 9 g/dL — ABNORMAL LOW (ref 13.0–17.0)
MCH: 29.9 pg (ref 26.0–34.0)
MCHC: 31.1 g/dL (ref 30.0–36.0)
MCV: 96 fL (ref 80.0–100.0)
Platelets: 128 10*3/uL — ABNORMAL LOW (ref 150–400)
RBC: 3.01 MIL/uL — ABNORMAL LOW (ref 4.22–5.81)
RDW: 15.8 % — ABNORMAL HIGH (ref 11.5–15.5)
WBC: 9.7 10*3/uL (ref 4.0–10.5)
nRBC: 0.3 % — ABNORMAL HIGH (ref 0.0–0.2)

## 2019-06-17 LAB — TYPE AND SCREEN
ABO/RH(D): A POS
Antibody Screen: NEGATIVE
Unit division: 0
Unit division: 0

## 2019-06-17 LAB — SARS CORONAVIRUS 2 BY RT PCR (HOSPITAL ORDER, PERFORMED IN ~~LOC~~ HOSPITAL LAB): SARS Coronavirus 2: NEGATIVE

## 2019-06-17 LAB — URINE CULTURE: Culture: NO GROWTH

## 2019-06-17 MED ORDER — SODIUM CHLORIDE 0.9 % IV BOLUS
500.0000 mL | Freq: Once | INTRAVENOUS | Status: AC
  Administered 2019-06-17: 500 mL via INTRAVENOUS

## 2019-06-17 MED ORDER — HYDROCORTISONE NA SUCCINATE PF 100 MG IJ SOLR
50.0000 mg | Freq: Four times a day (QID) | INTRAMUSCULAR | Status: DC
  Administered 2019-06-17 – 2019-06-19 (×9): 50 mg via INTRAVENOUS
  Filled 2019-06-17 (×9): qty 2

## 2019-06-17 MED ORDER — SODIUM CHLORIDE 0.9 % IV SOLN
2.0000 g | Freq: Three times a day (TID) | INTRAVENOUS | Status: DC
  Administered 2019-06-17: 11:00:00 2 g via INTRAVENOUS
  Filled 2019-06-17 (×2): qty 2

## 2019-06-17 MED ORDER — ALPRAZOLAM 0.5 MG PO TABS
0.5000 mg | ORAL_TABLET | Freq: Two times a day (BID) | ORAL | Status: DC | PRN
  Administered 2019-06-17 – 2019-06-19 (×3): 0.5 mg via ORAL
  Filled 2019-06-17 (×3): qty 1

## 2019-06-17 MED ORDER — SODIUM CHLORIDE 0.9 % IV BOLUS
1000.0000 mL | Freq: Once | INTRAVENOUS | Status: AC
  Administered 2019-06-17: 13:00:00 1000 mL via INTRAVENOUS

## 2019-06-17 MED ORDER — TRAMADOL HCL 50 MG PO TABS
50.0000 mg | ORAL_TABLET | Freq: Once | ORAL | Status: AC
  Administered 2019-06-17: 03:00:00 50 mg via ORAL
  Filled 2019-06-17: qty 1

## 2019-06-17 MED ORDER — SODIUM CHLORIDE 0.9 % IV SOLN
2.0000 g | Freq: Two times a day (BID) | INTRAVENOUS | Status: DC
  Administered 2019-06-17 – 2019-06-18 (×2): 2 g via INTRAVENOUS
  Filled 2019-06-17 (×3): qty 2

## 2019-06-17 MED ORDER — SODIUM CHLORIDE 0.9 % IV BOLUS
1000.0000 mL | Freq: Once | INTRAVENOUS | Status: AC
  Administered 2019-06-17: 09:00:00 1000 mL via INTRAVENOUS

## 2019-06-17 MED ORDER — SODIUM CHLORIDE 0.9 % IV SOLN
INTRAVENOUS | Status: DC
  Administered 2019-06-17 – 2019-06-18 (×2): via INTRAVENOUS

## 2019-06-17 NOTE — Progress Notes (Addendum)
Pharmacy Antibiotic Note  [name] is a 73 y.o. male admitted on 06/10/2019 with sepsis.  Pharmacy has been consulted for cefepime dosing.  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  He was on cefepime then transitioned to PO cipro on 7/30.  Now febrile and escalating back to Cefepime.  Repeat BCx 7/31 NGx2 days, new BCx sent today, 7/31 UCx in progress WBC remains WNL at 9.7 Tmax 101.9  SCr 0.96   Plan: Cefepime 2 gm IV q8h F/u renal fxn, culture data  Height: 5\' 6"  (167.6 cm) Weight: 196 lb 6.9 oz (89.1 kg) IBW/kg (Calculated) : 63.8  Temp (24hrs), Avg:99.8 F (37.7 C), Min:97.8 F (36.6 C), Max:101.9 F (38.8 C)  Recent Labs  Lab 06/10/19 1728  06/10/19 1928 06/11/19 0556 06/12/19 0819 06/13/19 0251 06/14/19 0525 06/15/19 0429 06/16/19 0733 06/17/19 0257  WBC  --    < >  --  7.5 10.6* 11.3* 9.7 7.6 8.4 9.7  CREATININE  --   --   --  0.79 0.84 0.94 0.94 0.96  --   --   LATICACIDVEN 3.1*  --  1.7  --   --   --   --   --   --   --    < > = values in this interval not displayed.    Estimated Creatinine Clearance: 70.6 mL/min (by C-G formula based on SCr of 0.96 mg/dL).    Allergies  Allergen Reactions  . Amlodipine Swelling   Antimicrobials this admission: 7/26 cefepime >> 7/30  8/2>> 7/30 Cipro>> 8/2 7/26 vancomycin >> 7/27 7/26 flagyl x 1 dose Dose adjustments this admission:  Microbiology results: 7/26 MRSA PCR neg 7/27 UCx (after abx): insig growth F 7/26 BCx2:BCID  2/4 anaerobic bottles + enterobacter cloacae complex, no KPC, Sens cefepime, cipro, gent, imi, septra 7/26 Covid neg 7/31 BCx2: ngtd 7/31 Ucx: ip  Thank you for allowing pharmacy to be a part of this patient's care.  Eudelia Bunch, Pharm.D [phone] 06/17/2019 7:42 AM   Addendum: SCr drawn after consult done has increased. SCr 0.96>>1.84. CrCl ~ 37 ml/min  Plan: Cefepime 2 gm IV q12h F/u renal fxn  Eudelia Bunch,  Pharm.D [phone] 06/17/2019 2:07 PM

## 2019-06-17 NOTE — Progress Notes (Addendum)
PROGRESS NOTE    QUINTON VOTH  TFT:[id number] DOB: 07-08-1946 DOA: 06/10/2019 PCP: [name], MD     Brief Narrative:  [name] a 73 y.o.malewith medical history significant formetastatic bladder cancer, COPD with chronic respiratory failure on 6 L supplemental O2 via [place], CAD s/p PCI with DES, chronic diastolic CHF, hypertension, hyperlipidemia, anemia of chronic disease, anxiety, and OSA on BiPAP whopresents to the ED for evaluation of dyspnea.  Patient was recently admitted from 05/26/2019-06/02/2019 at which time he was treated for hypoxic respiratory failure secondary to COPD exacerbation. He was also found to have sepsis due to E. coli bacteremia felt secondary to necrotic bladder tumor.  Patient was feeling well until today when he developed new onset shortness of breath when he got up to go outside of his house. He had noticed some wheezing as well. He states that he felt his lungs were getting tight and he denies using home nebulizer treatment and time therefore called EMS. He denies any chest pain, palpitations, dysuria. He has abdominal pain from his bladder tumor. He reports swelling in both of his legs which he feels is stable.  Per ED documentation patient was on his home 6 L O2 via [place] with O2 saturation in the 80s on EMS arrival. He was placed on BiPAP with relief. When taken off BiPAP he again became very short of breath. He was also noted to have a temperature 102 Fahrenheit. He was subsequently brought to the ED for further evaluation.  Patient was seen by hospital service admitted to SDU and treated for acute on chronic respiratory failure with COPD exacerbation, sepsis with bacteremia gram-negative rods, and elevated troponins with no interventional recommendations by cardiology.   New events last 24 hours / Subjective: He was doing well and supposed to discharge home yesterday afternoon. However, discharge was canceled due to fever 101. He  states that he had a bad night. This morning, he has continued to spike fevers, tachycardic, tachypenic with low BP 83/52.   Assessment & Plan:   Principal Problem:   Acute on chronic respiratory failure (HCC) Active Problems:   Malignant neoplasm of bladder (HCC)   Anxiety   Essential hypertension   End stage COPD (HCC)   OSA treated with BiPAP   Chronic diastolic CHF (congestive heart failure) (HCC)   Anemia of chronic disease   Sepsis ([place])   Acute on chronic respiratory failuredue to exacerbation of COPD -Off BiPAP this morning, back on his baseline 6 L nasal cannula O2. He may need to go back on BiPAP as his work of breathing is worse this morning. RT in room and giving breathing treatment  -Due to new fevers and negative work up so far, repeat COVID test today   Sepsis secondary to Enterobacter bacteremia -Recent E. coli bacteremia now presenting with fever, leukocytosis, tachypnea, tachycardia. Urinalysis negative, however obtained after patient received antibiotics -Blood culture 7/26 sensitive to cefepime, Cipro -He was transitioned to Cipro in anticipation of discharge, however having fevers now. Repeat blood cultures 7/31 negative. Repeat urine culture pending  -Broaden antibiotics back to Cefepime   Hypotension -Hold metoprolol and lasix. Give 542ml IVF bolus and repeat BP   Elevated troponin in setting of CADs/p PCI w/ DES in 2008 -Suspect demand injury from respiratory failure and sepsis as above. Per EDP discussion with cardiology he is not aninterventional candidate. 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 Status post radiation therapy, neurosurgery and  chemotherapy per patient. Follows with palliative care.   Anemia of chronic disease -Transfused total 2 unit packed red blood cell this admission -Hemoglobin 9.0 today   Hypertension -Metoprolol on hold today   Hyperlipidemia -Continue simvastatin  Anxiety -Continue home Xanax as needed  OSA -Continue BiPAP nightly       DVT prophylaxis: SCD Code Status: DNR Family Communication: Spoke with wife over the phone, asked her to visit patient in hospital today  Disposition Plan: Pending further stabilization. Clinically deteriorated overnight. Consult palliative care   Consultants:   Palliative care   Procedures:   None  Antimicrobials:  Anti-infectives (From admission, onward)   Start     Dose/Rate Route Frequency Ordered Stop   06/17/19 0800  ceFEPIme (MAXIPIME) 2 g in sodium chloride 0.9 % 100 mL IVPB     2 g 200 mL/hr over 30 Minutes Intravenous Every 8 hours 06/17/19 0742     06/16/19 0000  ciprofloxacin (CIPRO) 500 MG tablet     500 mg Oral 2 times daily 06/16/19 1117 06/23/19 2359   06/14/19 0800  ciprofloxacin (CIPRO) tablet 500 mg  Status:  Discontinued     500 mg Oral 2 times daily 06/14/19 0748 06/17/19 0711   06/12/19 0300  vancomycin (VANCOCIN) 1,250 mg in sodium chloride 0.9 % 250 mL IVPB  Status:  Discontinued     1,250 mg 166.7 mL/hr over 90 Minutes Intravenous Daily 06/11/19 0020 06/11/19 1605   06/11/19 0400  ceFEPIme (MAXIPIME) 2 g in sodium chloride 0.9 % 100 mL IVPB  Status:  Discontinued     2 g 200 mL/hr over 30 Minutes Intravenous Every 8 hours 06/11/19 0020 06/14/19 0748   06/11/19 0015  vancomycin (VANCOCIN) 1,750 mg in sodium chloride 0.9 % 500 mL IVPB     1,750 mg 250 mL/hr over 120 Minutes Intravenous  Once 06/11/19 0008 06/11/19 0223   06/10/19 2015  metroNIDAZOLE (FLAGYL) IVPB 500 mg     500 mg 100 mL/hr over 60 Minutes Intravenous  Once 06/10/19 2005 06/10/19 2224   06/10/19 2015  vancomycin (VANCOCIN) IVPB 1000 mg/200 mL premix   Status:  Discontinued     1,000 mg 200 mL/hr over 60 Minutes Intravenous  Once 06/10/19 2005 06/11/19 0008   06/10/19 2015  ceFEPIme (MAXIPIME) 2 g in sodium chloride 0.9 % 100 mL IVPB     2 g 200 mL/hr over 30 Minutes Intravenous  Once 06/10/19 2005 06/10/19 2049       Objective: Vitals:   06/17/19 0433 06/17/19 0633 06/17/19 0749 06/17/19 0802  BP:   (!) 83/52   Pulse:   (!) 136   Resp:   (!) 22   Temp:  (!) 101.5 F (38.6 C) 99.3 F (37.4 C) 99.2 F (37.3 C)  TempSrc:  Oral Oral Oral  SpO2:   100% 99%  Weight: 89.1 kg     Height:        Intake/Output Summary (Last 24 hours) at 06/17/2019 0853 Last data filed at 06/16/2019 2200 Gross per 24 hour  Intake 300 ml  Output 1150 ml  Net -850 ml   Filed Weights   06/15/19 0434 06/16/19 0500 06/17/19 0433  Weight: 88.2 kg 89.1 kg 89.1 kg    Examination: General exam: Appears calm and comfortable  Respiratory  system: Diminished breath sounds bilaterally, no wheezing, tachypenic, increased work of breathing today  Cardiovascular system: S1 & S2 heard, tachycardic regular rhythm. No JVD, murmurs, rubs, gallops or clicks. +bilateral pedal edema. Gastrointestinal system: Abdomen is nondistended, soft and nontender. No organomegaly or masses felt. Normal bowel sounds heard. Central nervous system: Alert and oriented. No focal neurological deficits. Extremities: Symmetric  Skin: No rashes, lesions or ulcers Psychiatry: Judgement and insight appear normal. Mood & affect appropriate.    Data Reviewed: I have personally reviewed following labs and imaging studies  CBC: Recent Labs  Lab 06/10/19 1904  06/12/19 0819 06/13/19 0251 06/14/19 0525 06/14/19 1500 06/15/19 0429 06/16/19 0733 06/16/19 1509 06/17/19 0257  WBC 16.0*   < > 10.6* 11.3* 9.7  --  7.6 8.4  --  9.7  NEUTROABS 14.8*  --  9.3* 9.8*  --   --   --   --   --   --   HGB 8.0*   < > 9.2* 8.2* 7.7* 8.7* 8.1* 8.0* 8.5* 9.0*  HCT 24.6*   < > 28.2* 25.9* 23.5* 26.6*  25.3* 25.3* 26.1* 28.9*  MCV 95.0   < > 93.7 93.8 94.4  --  94.4 95.8  --  96.0  PLT 138*   < > 135* 121* 117*  --  120* 132*  --  128*   < > = values in this interval not displayed.   Basic Metabolic Panel: Recent Labs  Lab 06/11/19 0556 06/12/19 0819 06/13/19 0251 06/14/19 0525 06/15/19 0429  NA 132* 131* 130* 130* 128*  K 4.7 4.3 3.9 3.7 3.8  CL 97* 95* 94* 94* 90*  CO2 25 26 26 27 29   GLUCOSE 291* 192* 175* 197* 163*  BUN 24* 24* 24* 23 22  CREATININE 0.79 0.84 0.94 0.94 0.96  CALCIUM 7.8* 8.1* 7.8* 7.9* 8.0*   GFR: Estimated Creatinine Clearance: 70.6 mL/min (by C-G formula based on SCr of 0.96 mg/dL). Liver Function Tests: No results for input(s): AST, ALT, ALKPHOS, BILITOT, PROT, ALBUMIN in the last 168 hours. No results for input(s): LIPASE, AMYLASE in the last 168 hours. No results for input(s): AMMONIA in the last 168 hours. Coagulation Profile: No results for input(s): INR, PROTIME in the last 168 hours. Cardiac Enzymes: No results for input(s): CKTOTAL, CKMB, CKMBINDEX, TROPONINI in the last 168 hours. BNP (last 3 results) No results for input(s): PROBNP in the last 8760 hours. HbA1C: No results for input(s): HGBA1C in the last 72 hours. CBG: No results for input(s): GLUCAP in the last 168 hours. Lipid Profile: No results for input(s): CHOL, HDL, LDLCALC, TRIG, CHOLHDL, LDLDIRECT in the last 72 hours. Thyroid Function Tests: No results for input(s): TSH, T4TOTAL, FREET4, T3FREE, THYROIDAB in the last 72 hours. Anemia Panel: No results for input(s): VITAMINB12, FOLATE, FERRITIN, TIBC, IRON, RETICCTPCT in the last 72 hours. Sepsis Labs: Recent Labs  Lab 06/10/19 1728 06/10/19 1928  LATICACIDVEN 3.1* 1.7    Recent Results (from the past 240 hour(s))  SARS Coronavirus 2 (CEPHEID- Performed in [facility] hospital lab), Hosp Order     Status: None   Collection Time: 06/10/19  5:28 PM   Specimen: Nasopharyngeal Swab  Result Value Ref Range Status   SARS  Coronavirus 2 NEGATIVE NEGATIVE Final    Comment: (NOTE) If result is NEGATIVE SARS-CoV-2 target nucleic acids are NOT DETECTED. The SARS-CoV-2 RNA is generally detectable in upper and lower  respiratory specimens during the acute phase of infection. The lowest  concentration of  SARS-CoV-2 viral copies this assay can detect is 250  copies / mL. A negative result does not preclude SARS-CoV-2 infection  and should not be used as the sole basis for treatment or other  patient management decisions.  A negative result may occur with  improper specimen collection / handling, submission of specimen other  than nasopharyngeal swab, presence of viral mutation(s) within the  areas targeted by this assay, and inadequate number of viral copies  (<250 copies / mL). A negative result must be combined with clinical  observations, patient history, and epidemiological information. If result is POSITIVE SARS-CoV-2 target nucleic acids are DETECTED. The SARS-CoV-2 RNA is generally detectable in upper and lower  respiratory specimens dur ing the acute phase of infection.  Positive  results are indicative of active infection with SARS-CoV-2.  Clinical  correlation with patient history and other diagnostic information is  necessary to determine patient infection status.  Positive results do  not rule out bacterial infection or co-infection with other viruses. If result is PRESUMPTIVE POSTIVE SARS-CoV-2 nucleic acids MAY BE PRESENT.   A presumptive positive result was obtained on the submitted specimen  and confirmed on repeat testing.  While 2019 novel coronavirus  (SARS-CoV-2) nucleic acids may be present in the submitted sample  additional confirmatory testing may be necessary for epidemiological  and / or clinical management purposes  to differentiate between  SARS-CoV-2 and other Sarbecovirus currently known to infect humans.  If clinically indicated additional testing with an alternate test   methodology [phone]) is advised. The SARS-CoV-2 RNA is generally  detectable in upper and lower respiratory sp ecimens during the acute  phase of infection. The expected result is Negative. Fact Sheet for Patients:  StrictlyIdeas.no Fact Sheet for Healthcare Providers: BankingDealers.co.za This test is not yet approved or cleared by the [place] FDA and has been authorized for detection and/or diagnosis of SARS-CoV-2 by FDA under an Emergency Use Authorization (EUA).  This EUA will remain in effect (meaning this test can be used) for the duration of the COVID-19 declaration under Section 564(b)(1) of the Act, 21 U.S.C. section 360bbb-3(b)(1), unless the authorization is terminated or revoked sooner. Performed at [facility], [place] [address] [place], [place] [address]   Culture, blood (routine x 2)     Status: Abnormal   Collection Time: 06/10/19  5:28 PM   Specimen: Left Antecubital; Blood  Result Value Ref Range Status   Specimen Description   Final    LEFT ANTECUBITAL Performed at [facility] [address] [place], [place] [address]    Special Requests   Final    BOTTLES DRAWN AEROBIC AND ANAEROBIC Blood Culture adequate volume Performed at [facility] [address] [place], [place] [address]    Culture  Setup Time   Final    GRAM NEGATIVE RODS IN BOTH AEROBIC AND ANAEROBIC BOTTLES CRITICAL RESULT CALLED TO, READ BACK BY AND VERIFIED WITH: Jenison [id number] FCP Performed at [facility], [place] [address], [place], [place] [address]    Culture ENTEROBACTER CLOACAE (A)  Final   Report Status 06/13/2019 FINAL  Final   Organism ID, Bacteria ENTEROBACTER CLOACAE  Final      Susceptibility   Enterobacter cloacae - MIC*    CEFAZOLIN >=64 RESISTANT Resistant     CEFEPIME 2 SENSITIVE Sensitive     CEFTAZIDIME >=64 RESISTANT Resistant      CEFTRIAXONE >=64 RESISTANT Resistant  CIPROFLOXACIN <=0.25 SENSITIVE Sensitive     GENTAMICIN <=1 SENSITIVE Sensitive     IMIPENEM <=0.25 SENSITIVE Sensitive     TRIMETH/SULFA <=20 SENSITIVE Sensitive     PIP/TAZO >=128 RESISTANT Resistant     * ENTEROBACTER CLOACAE  Blood Culture ID Panel (Reflexed)     Status: Abnormal   Collection Time: 06/10/19  5:28 PM  Result Value Ref Range Status   Enterococcus species NOT DETECTED NOT DETECTED Final   Listeria monocytogenes NOT DETECTED NOT DETECTED Final   Staphylococcus species NOT DETECTED NOT DETECTED Final   Staphylococcus aureus (BCID) NOT DETECTED NOT DETECTED Final   Streptococcus species NOT DETECTED NOT DETECTED Final   Streptococcus agalactiae NOT DETECTED NOT DETECTED Final   Streptococcus pneumoniae NOT DETECTED NOT DETECTED Final   Streptococcus pyogenes NOT DETECTED NOT DETECTED Final   Acinetobacter baumannii NOT DETECTED NOT DETECTED Final   Enterobacteriaceae species DETECTED (A) NOT DETECTED Final    Comment: Enterobacteriaceae represent a large family of gram-negative bacteria, not a single organism. CRITICAL RESULT CALLED TO, READ BACK BY AND VERIFIED WITH: [place] [phone] FCP    Enterobacter cloacae complex DETECTED (A) NOT DETECTED Final    Comment: CRITICAL RESULT CALLED TO, READ BACK BY AND VERIFIED WITH: [place] [phone] FCP    Escherichia coli NOT DETECTED NOT DETECTED Final   Klebsiella oxytoca NOT DETECTED NOT DETECTED Final   Klebsiella pneumoniae NOT DETECTED NOT DETECTED Final   Proteus species NOT DETECTED NOT DETECTED Final   Serratia marcescens NOT DETECTED NOT DETECTED Final   Carbapenem resistance NOT DETECTED NOT DETECTED Final   Haemophilus influenzae NOT DETECTED NOT DETECTED Final   Neisseria meningitidis NOT DETECTED NOT DETECTED Final   Pseudomonas aeruginosa NOT DETECTED NOT DETECTED Final   Candida albicans NOT DETECTED NOT DETECTED Final   Candida glabrata NOT  DETECTED NOT DETECTED Final   Candida krusei NOT DETECTED NOT DETECTED Final   Candida parapsilosis NOT DETECTED NOT DETECTED Final   Candida tropicalis NOT DETECTED NOT DETECTED Final    Comment: Performed at [facility], [place] [address], [place], [place] [address]  Culture, blood (routine x 2)     Status: Abnormal   Collection Time: 06/10/19  5:33 PM   Specimen: BLOOD LEFT HAND  Result Value Ref Range Status   Specimen Description   Final    BLOOD LEFT HAND Performed at [facility] [address], [place], [place] [address]    Special Requests   Final    BOTTLES DRAWN AEROBIC AND ANAEROBIC Blood Culture adequate volume Performed at [facility] [address], [place], [place] [address]    Culture  Setup Time   Final    GRAM NEGATIVE RODS IN BOTH AEROBIC AND ANAEROBIC BOTTLES CRITICAL VALUE NOTED.  VALUE IS CONSISTENT WITH PREVIOUSLY REPORTED AND CALLED VALUE.    Culture (A)  Final    ENTEROBACTER CLOACAE SUSCEPTIBILITIES PERFORMED ON PREVIOUS CULTURE WITHIN THE LAST 5 DAYS. Performed at [facility], [place] [address] [place], [place] [address]    Report Status 06/13/2019 FINAL  Final  MRSA PCR Screening     Status: None   Collection Time: 06/10/19 10:46 PM   Specimen: Nasal Mucosa; Nasopharyngeal  Result Value Ref Range Status   MRSA by PCR NEGATIVE NEGATIVE Final    Comment:        The GeneXpert MRSA Assay (FDA approved for NASAL specimens only), is one component of  a comprehensive MRSA colonization surveillance program. It is not intended to diagnose MRSA infection nor to guide or monitor treatment for MRSA infections. Performed at [facility], [place] [address], [place], [place] [address]   Urine culture     Status: Abnormal   Collection Time: 06/11/19 12:00 AM   Specimen: Urine, Random  Result Value Ref Range Status   Specimen Description   Final    URINE, RANDOM Performed at [facility] [address], [place], [place] [address]    Special Requests   Final    NONE Performed at [facility], [place] [address], [place], [place] [address]    Culture (A)  Final    <10,000 COLONIES/mL INSIGNIFICANT GROWTH Performed at [facility] [address] [place], [place] [address]    Report Status 06/12/2019 FINAL  Final  Culture, blood (routine x 2)     Status: None (Preliminary result)   Collection Time: 06/15/19  4:38 PM   Specimen: BLOOD RIGHT ARM  Result Value Ref Range Status   Specimen Description   Final    BLOOD RIGHT ARM Performed at [facility] [address] [place], [place] [address]    Special Requests   Final    BOTTLES DRAWN AEROBIC ONLY Blood Culture adequate volume Performed at [facility] [address], [place], [place] [address]    Culture   Final    NO GROWTH 2 DAYS Performed at [facility] [address] [place], [place] [address]    Report Status PENDING  Incomplete  Culture, blood (routine x 2)     Status: None (Preliminary result)   Collection Time: 06/15/19  4:38 PM   Specimen: BLOOD RIGHT HAND  Result Value Ref Range Status   Specimen Description   Final    BLOOD RIGHT HAND Performed at [facility] [address] [place], [place] [address]    Special Requests   Final    BOTTLES DRAWN AEROBIC ONLY Blood Culture results may not be optimal due to an inadequate volume of blood received in culture bottles Performed at [facility] [address] [place], [place] [address]    Culture   Final    NO GROWTH 2 DAYS Performed at [facility] [address] [place],  [address]    Report Status PENDING  Incomplete      Radiology Studies: Dg Chest Port 1 View  Result Date: 06/15/2019 CLINICAL DATA:  Fever EXAM: PORTABLE CHEST 1 VIEW COMPARISON:  06/13/2019.  Chest CT 06/10/2019. FINDINGS: There  is hyperinflation of the lungs compatible with COPD. Bilateral pulmonary nodules/masses compatible with metastases as seen on prior imaging. No acute confluent airspace opacities or effusions. Heart is normal size. IMPRESSION: COPD.  Stable pulmonary metastases. No acute findings. Electronically Signed   By: Rolm Baptise M.D.   On: 06/15/2019 19:52      Scheduled Meds: . budesonide  0.25 mg Nebulization BID  . chlorhexidine  15 mL Mouth Rinse BID  . Chlorhexidine Gluconate Cloth  6 each Topical Daily  . furosemide  40 mg Oral Daily  . ipratropium-albuterol  3 mL Nebulization Q4H  . mouth rinse  15 mL Mouth Rinse q12n4p  . metoprolol tartrate  25 mg Oral BID  . pantoprazole  40 mg Oral Daily  . simvastatin  20 mg Oral q1800  . sodium chloride flush  10-40 mL Intracatheter  Q12H  . sodium chloride flush  3 mL Intravenous Q12H  . vitamin B-12  1,000 mcg Oral Daily   Continuous Infusions: . ceFEPime (MAXIPIME) IV       LOS: 7 days      Time spent: 45 minutes   Dessa Phi, DO [facility] [email or web address] 06/17/2019, 8:53 AM

## 2019-06-17 NOTE — Progress Notes (Signed)
Pt. Continues to be hypotensive s/p 2.5 liter boluses.  Pt. Asymptomatic.  MD updated on patients condition.  RN will continue to monitor.

## 2019-06-17 NOTE — Progress Notes (Signed)
  PROGRESS NOTE  Patient evaluated for the third time today. Wife is at bedside. He reports feeling much improved since this morning. He is making urine now on IVF. HR 80s, improved and no longer tachycardic. RR 15, work of breathing improved since this morning. He is wearing ted hose and instructed to keep legs elevated. BP during my exam was 91/54 around 5:45pm. He has been afebrile since this morning. Overall, he appears to be better, but we will closely monitor in stepdown unit. If he becomes much more hypotensive, we could try another IVF bolus vs PCCM consult with pressor.    Dessa Phi, DO [facility] [email or web address] 06/17/2019, 5:53 PM

## 2019-06-18 LAB — COMPREHENSIVE METABOLIC PANEL
ALT: 109 U/L — ABNORMAL HIGH (ref 0–44)
AST: 89 U/L — ABNORMAL HIGH (ref 15–41)
Albumin: 1.8 g/dL — ABNORMAL LOW (ref 3.5–5.0)
Alkaline Phosphatase: 261 U/L — ABNORMAL HIGH (ref 38–126)
Anion gap: 12 (ref 5–15)
BUN: 29 mg/dL — ABNORMAL HIGH (ref 8–23)
CO2: 21 mmol/L — ABNORMAL LOW (ref 22–32)
Calcium: 6.9 mg/dL — ABNORMAL LOW (ref 8.9–10.3)
Chloride: 97 mmol/L — ABNORMAL LOW (ref 98–111)
Creatinine, Ser: 1.13 mg/dL (ref 0.61–1.24)
GFR calc Af Amer: >60 mL/min (ref >60)
GFR calc non Af Amer: >60 mL/min (ref >60)
Glucose, Bld: 242 mg/dL — ABNORMAL HIGH (ref 70–99)
Potassium: 4.4 mmol/L (ref 3.5–5.1)
Sodium: 130 mmol/L — ABNORMAL LOW (ref 135–145)
Total Bilirubin: 1 mg/dL (ref 0.3–1.2)
Total Protein: 5 g/dL — ABNORMAL LOW (ref 6.5–8.1)

## 2019-06-18 LAB — CBC
HCT: 21.5 % — ABNORMAL LOW (ref 39.0–52.0)
Hemoglobin: 6.9 g/dL — CL (ref 13.0–17.0)
MCH: 31.1 pg (ref 26.0–34.0)
MCHC: 32.1 g/dL (ref 30.0–36.0)
MCV: 96.8 fL (ref 80.0–100.0)
Platelets: 91 10*3/uL — ABNORMAL LOW (ref 150–400)
RBC: 2.22 MIL/uL — ABNORMAL LOW (ref 4.22–5.81)
RDW: 16 % — ABNORMAL HIGH (ref 11.5–15.5)
WBC: 7.6 10*3/uL (ref 4.0–10.5)
nRBC: 0 % (ref 0.0–0.2)

## 2019-06-18 LAB — GLUCOSE, CAPILLARY
Glucose-Capillary: 221 mg/dL — ABNORMAL HIGH (ref 70–99)
Glucose-Capillary: 268 mg/dL — ABNORMAL HIGH (ref 70–99)
Glucose-Capillary: 295 mg/dL — ABNORMAL HIGH (ref 70–99)
Glucose-Capillary: 210 mg/dL — ABNORMAL HIGH (ref 70–99)

## 2019-06-18 LAB — HEMOGLOBIN AND HEMATOCRIT, BLOOD
HCT: 31.1 % — ABNORMAL LOW (ref 39.0–52.0)
Hemoglobin: 9.7 g/dL — ABNORMAL LOW (ref 13.0–17.0)

## 2019-06-18 LAB — PREPARE RBC (CROSSMATCH)

## 2019-06-18 MED ORDER — SODIUM CHLORIDE 0.9% IV SOLUTION
Freq: Once | INTRAVENOUS | Status: AC
  Administered 2019-06-18: 15:00:00 via INTRAVENOUS

## 2019-06-18 MED ORDER — SODIUM CHLORIDE 0.9 % IV SOLN
2.0000 g | Freq: Three times a day (TID) | INTRAVENOUS | Status: DC
  Administered 2019-06-18 – 2019-06-19 (×3): 2 g via INTRAVENOUS
  Filled 2019-06-18 (×4): qty 2

## 2019-06-18 MED ORDER — SODIUM CHLORIDE 0.9 % IV BOLUS
1000.0000 mL | Freq: Once | INTRAVENOUS | Status: AC
  Administered 2019-06-18: 01:00:00 1000 mL via INTRAVENOUS

## 2019-06-18 MED ORDER — MORPHINE SULFATE (PF) 2 MG/ML IV SOLN
2.0000 mg | INTRAVENOUS | Status: DC | PRN
  Administered 2019-06-18 – 2019-06-19 (×4): 2 mg via INTRAVENOUS
  Filled 2019-06-18 (×4): qty 1

## 2019-06-18 MED ORDER — INSULIN ASPART 100 UNIT/ML ~~LOC~~ SOLN
0.0000 [IU] | Freq: Three times a day (TID) | SUBCUTANEOUS | Status: DC
  Administered 2019-06-18: 5 [IU] via SUBCUTANEOUS
  Administered 2019-06-18: 09:00:00 3 [IU] via SUBCUTANEOUS
  Administered 2019-06-18: 12:00:00 5 [IU] via SUBCUTANEOUS
  Administered 2019-06-19: 09:00:00 2 [IU] via SUBCUTANEOUS
  Administered 2019-06-19: 13:00:00 3 [IU] via SUBCUTANEOUS

## 2019-06-18 MED ORDER — ALBUMIN HUMAN 25 % IV SOLN
12.5000 g | Freq: Once | INTRAVENOUS | Status: AC
  Administered 2019-06-18: 03:00:00 12.5 g via INTRAVENOUS
  Filled 2019-06-18: qty 50

## 2019-06-18 NOTE — Progress Notes (Signed)
Patient BP reading in the 70's /45 MAP 55.  NP on call notified , new order bolus 1000 ml infusing. Will continue to monitor.

## 2019-06-18 NOTE — Progress Notes (Addendum)
Pharmacy Antibiotic Note  [name] is a 73 y.o. male presented to the ED on 06/10/2019 with c/o SOB. Patient's currently on cefepime for Ecoli, enterobacter, and clostridium perfringens bacteremia.  Today, 06/18/2019: - day #8 abx - afeb, wbc wnl - scr down 1.13 (crcl~61)  Plan: - increased cefepime to 2gm IV q8h for renal function - f/u renal function and cx _____________________________________  Height: 5\' 6"  (167.6 cm) Weight: 203 lb 7.8 oz (92.3 kg) IBW/kg (Calculated) : 63.8  Temp (24hrs), Avg:96.8 F (36 C), Min:94.2 F (34.6 C), Max:98 F (36.7 C)  Recent Labs  Lab 06/13/19 0251 06/14/19 0525 06/15/19 0429 06/16/19 0733 06/17/19 0257 06/17/19 1027 06/18/19 0134 06/18/19 0822  WBC 11.3* 9.7 7.6 8.4 9.7  --   --  7.6  CREATININE 0.94 0.94 0.96  --   --  1.84* 1.13  --     Estimated Creatinine Clearance: 61 mL/min (by C-G formula based on SCr of 1.13 mg/dL).    Allergies  Allergen Reactions  . Amlodipine Swelling    Antimicrobials this admission: 7/26 cefepime >> 7/30  8/2>> 7/30 Cipro>> 8/2 7/26 vancomycin >> 7/27 7/26 flagyl x 1 dose  Dose adjustments this admission: 8/2 cefepime 2 gm q8> q12h  Microbiology results: 7/26 MRSA PCR neg 7/27 UCx (after abx): insig growth F 7/26 BCx2:BCID  2/4 anaerobic bottles + enterobacter cloacae complex, no KPC, Sens cefepime, cipro, gent, imi, septra 7/26 Covid neg 7/31 BCx2: ngtd 7/31 Ucx: neg FINAL  Thank you for allowing pharmacy to be a part of this patient's care.  Lynelle Doctor 06/18/2019 1:22 PM

## 2019-06-18 NOTE — Progress Notes (Addendum)
PROGRESS NOTE    [name]  HUD:[id number] DOB: [birth year] DOA: 06/10/2019 PCP: [name], MD     Brief Narrative:  [name] a 73 y.o.malewith medical history significant formetastatic bladder cancer, COPD with chronic respiratory failure on 6 L supplemental O2 via [place], CAD s/p PCI with DES, chronic diastolic CHF, hypertension, hyperlipidemia, anemia of chronic disease, anxiety, and OSA on BiPAP whopresents to the ED for evaluation of dyspnea.  Patient was recently admitted from 05/26/2019-06/02/2019 at which time he was treated for hypoxic respiratory failure secondary to COPD exacerbation. He was also found to have sepsis due to E. coli bacteremia felt secondary to necrotic bladder tumor.  Patient was feeling well until today when he developed new onset shortness of breath when he got up to go outside of his house. He had noticed some wheezing as well. He states that he felt his lungs were getting tight and he denies using home nebulizer treatment and time therefore called EMS. He denies any chest pain, palpitations, dysuria. He has abdominal pain from his bladder tumor. He reports swelling in both of his legs which he feels is stable.  Per ED documentation patient was on his home 6 L O2 via [place] with O2 saturation in the 80s on EMS arrival. He was placed on BiPAP with relief. When taken off BiPAP he again became very short of breath. He was also noted to have a temperature 102 Fahrenheit. He was subsequently brought to the ED for further evaluation.  Patient was seen by hospital service admitted to SDU and treated for acute on chronic respiratory failure with COPD exacerbation, sepsis with bacteremia gram-negative rods, and elevated troponins with no interventional recommendations by cardiology.   He was doing well and supposed to discharge home 8/1 afternoon. Work up showed new pneumonia, antibiotics broadened back to cefepime. Repeat COVID negative. Patient  received IVF boluses for hypotension.   New events last 24 hours / Subjective: Feeling much better today. Denies worsening SOB, afebrile over last 24 hours. Asking if he can go home.   Assessment & Plan:   Principal Problem:   Acute on chronic respiratory failure (HCC) Active Problems:   Malignant neoplasm of bladder (HCC)   Anxiety   Essential hypertension   End stage COPD (HCC)   OSA treated with BiPAP   Chronic diastolic CHF (congestive heart failure) (HCC)   Anemia of chronic disease   Sepsis ([place])   Acute on chronic respiratory failuredue to exacerbation of COPD -On 6L [place] O2 at baseline. BiPAP prn   Severe sepsis secondary to HCAP, not POA, with hypotension  -Due to continued fevers 8/1-8/2, CXR obtained which revealed patchy consolidation bases which could be developing pneumonia. Antibiotics back to IV cefepime. Repeat COVID negative.  -Holding metoprolol and lasix due to hypotension -IVF -Solucortef added  -BP improved today   AKI -Resolved with IVF   Sepsis secondary to Enterobacter bacteremia  -Recent E. coli bacteremia now presenting with fever, leukocytosis, tachypnea, tachycardia. Urinalysis negative, however obtained after patient received antibiotics. Sepsis POA -Blood culture 7/26 sensitive to cefepime, Cipro -He was transitioned to Cipro in anticipation of discharge, however having fevers now. Repeat blood cultures 7/31 negative. Repeat urine culture 7/31 negative.  -Resolved   Elevated troponin in setting of CADs/p PCI w/ DES in 2008 -Suspect demand injury from respiratory failure and sepsis as above. Per EDP discussion with cardiology he is not aninterventional candidate. 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 Status post radiation therapy, neurosurgery and chemotherapy per patient. Follows with palliative care.   Anemia of chronic disease and symptomatic anemia  -Transfused total 2 unit packed red blood cell this admission -Hemoglobin 6.9 today -Transfuse 2 units pRBC today   Hypertension -Metoprolol on hold today   Hyperlipidemia -Continue simvastatin  Anxiety -Continue home Xanax as needed  OSA -Continue BiPAP nightly  Hyponatremia -Stable        DVT prophylaxis: SCD Code Status: DNR Family Communication: Wife at bedside  Disposition Plan: Pending further stabilization.   Consultants:   Palliative care   Procedures:   None  Antimicrobials:  Anti-infectives (From admission, onward)   Start     Dose/Rate Route Frequency Ordered Stop   06/17/19 2200  ceFEPIme (MAXIPIME) 2 g in sodium chloride 0.9 % 100 mL IVPB     2 g 200 mL/hr over 30 Minutes Intravenous Every 12 hours 06/17/19 1408     06/17/19 0800  ceFEPIme (MAXIPIME) 2 g in sodium chloride 0.9 % 100 mL IVPB  Status:  Discontinued     2 g 200 mL/hr over 30 Minutes Intravenous Every 8 hours 06/17/19 0742 06/17/19 1408   06/16/19 0000  ciprofloxacin (CIPRO) 500 MG tablet     500 mg Oral 2 times daily 06/16/19 1117 06/23/19 2359   06/14/19 0800  ciprofloxacin (CIPRO) tablet 500 mg  Status:  Discontinued     500 mg Oral 2 times daily 06/14/19 0748 06/17/19 0711   06/12/19 0300  vancomycin (VANCOCIN) 1,250 mg in sodium chloride 0.9 % 250 mL IVPB  Status:  Discontinued     1,250 mg 166.7 mL/hr over 90 Minutes Intravenous Daily 06/11/19 0020 06/11/19 1605   06/11/19 0400  ceFEPIme (MAXIPIME) 2 g in sodium chloride 0.9 % 100 mL IVPB  Status:  Discontinued     2 g 200 mL/hr over 30 Minutes Intravenous Every 8 hours 06/11/19 0020 06/14/19 0748   06/11/19 0015  vancomycin (VANCOCIN) 1,750 mg in sodium chloride  0.9 % 500 mL IVPB     1,750 mg 250 mL/hr over 120 Minutes Intravenous  Once 06/11/19 0008 06/11/19 0223   06/10/19 2015  metroNIDAZOLE (FLAGYL) IVPB 500 mg     500 mg 100 mL/hr over 60 Minutes Intravenous  Once 06/10/19 2005 06/10/19 2224   06/10/19 2015  vancomycin (VANCOCIN) IVPB 1000 mg/200 mL premix  Status:  Discontinued     1,000 mg 200 mL/hr over 60 Minutes Intravenous  Once 06/10/19 2005 06/11/19 0008   06/10/19 2015  ceFEPIme (MAXIPIME) 2 g in sodium chloride 0.9 % 100 mL IVPB     2 g 200 mL/hr over 30 Minutes Intravenous  Once 06/10/19 2005 06/10/19 2049       Objective: Vitals:   06/18/19 0737 06/18/19 0800 06/18/19 0810 06/18/19 0828  BP: 110/64 (!) 161/101    Pulse: 73 75    Resp: 17 16    Temp:   (!) 94.5 F (34.7 C)   TempSrc:   Oral   SpO2: 100% 99%  100%  Weight:      Height:        Intake/Output Summary (Last 24 hours) at 06/18/2019 0853 Last data filed at 06/18/2019 0800 Gross per 24 hour  Intake  4650.88 ml  Output 2325 ml  Net 2325.88 ml   Filed Weights   06/16/19 0500 06/17/19 0433 06/18/19 0653  Weight: 89.1 kg 89.1 kg 92.3 kg    Examination: General exam: Appears calm and comfortable  Respiratory system: Diminished breath sounds, crackles bases L>R. 6L [place] O2 without acute distress  Cardiovascular system: S1 & S2 heard, RRR. No JVD, murmurs, rubs, gallops or clicks. +pedal edema, ted hose in place  Gastrointestinal system: Abdomen is nondistended, soft and nontender. No organomegaly or masses felt.  Central nervous system: Alert and oriented. No focal neurological deficits. Extremities: Symmetric  Skin: No rashes, lesions or ulcers Psychiatry: Judgement and insight appear normal. Mood & affect appropriate.    Data Reviewed: I have personally reviewed following labs and imaging studies  CBC: Recent Labs  Lab 06/12/19 0819 06/13/19 0251 06/14/19 0525  06/15/19 0429 06/16/19 0733 06/16/19 1509 06/17/19 0257 06/18/19 0822  WBC 10.6*  11.3* 9.7  --  7.6 8.4  --  9.7 7.6  NEUTROABS 9.3* 9.8*  --   --   --   --   --   --   --   HGB 9.2* 8.2* 7.7*   < > 8.1* 8.0* 8.5* 9.0* 6.9*  HCT 28.2* 25.9* 23.5*   < > 25.3* 25.3* 26.1* 28.9* 21.5*  MCV 93.7 93.8 94.4  --  94.4 95.8  --  96.0 96.8  PLT 135* 121* 117*  --  120* 132*  --  128* 91*   < > = values in this interval not displayed.   Basic Metabolic Panel: Recent Labs  Lab 06/13/19 0251 06/14/19 0525 06/15/19 0429 06/17/19 1027 06/18/19 0134  NA 130* 130* 128* 129* 130*  K 3.9 3.7 3.8 4.7 4.4  CL 94* 94* 90* 93* 97*  CO2 26 27 29 23  21*  GLUCOSE 175* 197* 163* 129* 242*  BUN 24* 23 22 31* 29*  CREATININE 0.94 0.94 0.96 1.84* 1.13  CALCIUM 7.8* 7.9* 8.0* 7.2* 6.9*   GFR: Estimated Creatinine Clearance: 61 mL/min (by C-G formula based on SCr of 1.13 mg/dL). Liver Function Tests: Recent Labs  Lab 06/18/19 0134  AST 89*  ALT 109*  ALKPHOS 261*  BILITOT 1.0  PROT 5.0*  ALBUMIN 1.8*   No results for input(s): LIPASE, AMYLASE in the last 168 hours. No results for input(s): AMMONIA in the last 168 hours. Coagulation Profile: No results for input(s): INR, PROTIME in the last 168 hours. Cardiac Enzymes: No results for input(s): CKTOTAL, CKMB, CKMBINDEX, TROPONINI in the last 168 hours. BNP (last 3 results) No results for input(s): PROBNP in the last 8760 hours. HbA1C: No results for input(s): HGBA1C in the last 72 hours. CBG: No results for input(s): GLUCAP in the last 168 hours. Lipid Profile: No results for input(s): CHOL, HDL, LDLCALC, TRIG, CHOLHDL, LDLDIRECT in the last 72 hours. Thyroid Function Tests: No results for input(s): TSH, T4TOTAL, FREET4, T3FREE, THYROIDAB in the last 72 hours. Anemia Panel: No results for input(s): VITAMINB12, FOLATE, FERRITIN, TIBC, IRON, RETICCTPCT in the last 72 hours. Sepsis Labs: No results for input(s): PROCALCITON, LATICACIDVEN in the last 168 hours.  Recent Results (from the past 240 hour(s))  SARS Coronavirus 2  (CEPHEID- Performed in [place] hospital lab), Hosp Order     Status: None   Collection Time: 06/10/19  5:28 PM   Specimen: Nasopharyngeal Swab  Result Value Ref Range Status   SARS Coronavirus 2 NEGATIVE NEGATIVE Final    Comment: (NOTE) If result is  NEGATIVE SARS-CoV-2 target nucleic acids are NOT DETECTED. The SARS-CoV-2 RNA is generally detectable in upper and lower  respiratory specimens during the acute phase of infection. The lowest  concentration of SARS-CoV-2 viral copies this assay can detect is 250  copies / mL. A negative result does not preclude SARS-CoV-2 infection  and should not be used as the sole basis for treatment or other  patient management decisions.  A negative result may occur with  improper specimen collection / handling, submission of specimen other  than nasopharyngeal swab, presence of viral mutation(s) within the  areas targeted by this assay, and inadequate number of viral copies  (<250 copies / mL). A negative result must be combined with clinical  observations, patient history, and epidemiological information. If result is POSITIVE SARS-CoV-2 target nucleic acids are DETECTED. The SARS-CoV-2 RNA is generally detectable in upper and lower  respiratory specimens dur ing the acute phase of infection.  Positive  results are indicative of active infection with SARS-CoV-2.  Clinical  correlation with patient history and other diagnostic information is  necessary to determine patient infection status.  Positive results do  not rule out bacterial infection or co-infection with other viruses. If result is PRESUMPTIVE POSTIVE SARS-CoV-2 nucleic acids MAY BE PRESENT.   A presumptive positive result was obtained on the submitted specimen  and confirmed on repeat testing.  While 2019 novel coronavirus  (SARS-CoV-2) nucleic acids may be present in the submitted sample  additional confirmatory testing may be necessary for epidemiological  and / or clinical  management purposes  to differentiate between  SARS-CoV-2 and other Sarbecovirus currently known to infect humans.  If clinically indicated additional testing with an alternate test  methodology [phone]) is advised. The SARS-CoV-2 RNA is generally  detectable in upper and lower respiratory sp ecimens during the acute  phase of infection. The expected result is Negative. Fact Sheet for Patients:  StrictlyIdeas.no Fact Sheet for Healthcare Providers: BankingDealers.co.za This test is not yet approved or cleared by the [place] FDA and has been authorized for detection and/or diagnosis of SARS-CoV-2 by FDA under an Emergency Use Authorization (EUA).  This EUA will remain in effect (meaning this test can be used) for the duration of the COVID-19 declaration under Section 564(b)(1) of the Act, 21 U.S.C. section 360bbb-3(b)(1), unless the authorization is terminated or revoked sooner. Performed at [facility], [place] [address] [place], [place] [address]   Culture, blood (routine x 2)     Status: Abnormal   Collection Time: 06/10/19  5:28 PM   Specimen: Left Antecubital; Blood  Result Value Ref Range Status   Specimen Description   Final    LEFT ANTECUBITAL Performed at [facility] [address], [place], [place] [address]    Special Requests   Final    BOTTLES DRAWN AEROBIC AND ANAEROBIC Blood Culture adequate volume Performed at [facility] [address], [place], [place] [address]    Culture  Setup Time   Final    GRAM NEGATIVE RODS IN BOTH AEROBIC AND ANAEROBIC BOTTLES CRITICAL RESULT CALLED TO, READ BACK BY AND VERIFIED WITH: [place] [phone] FCP Performed at [facility], [place] [address], [place], [place] [address]    Culture ENTEROBACTER CLOACAE (A)  Final   Report Status 06/13/2019 FINAL  Final   Organism ID, Bacteria ENTEROBACTER CLOACAE   Final      Susceptibility   Enterobacter cloacae - MIC*  CEFAZOLIN >=64 RESISTANT Resistant     CEFEPIME 2 SENSITIVE Sensitive     CEFTAZIDIME >=64 RESISTANT Resistant     CEFTRIAXONE >=64 RESISTANT Resistant     CIPROFLOXACIN <=0.25 SENSITIVE Sensitive     GENTAMICIN <=1 SENSITIVE Sensitive     IMIPENEM <=0.25 SENSITIVE Sensitive     TRIMETH/SULFA <=20 SENSITIVE Sensitive     PIP/TAZO >=128 RESISTANT Resistant     * ENTEROBACTER CLOACAE  Blood Culture ID Panel (Reflexed)     Status: Abnormal   Collection Time: 06/10/19  5:28 PM  Result Value Ref Range Status   Enterococcus species NOT DETECTED NOT DETECTED Final   Listeria monocytogenes NOT DETECTED NOT DETECTED Final   Staphylococcus species NOT DETECTED NOT DETECTED Final   Staphylococcus aureus (BCID) NOT DETECTED NOT DETECTED Final   Streptococcus species NOT DETECTED NOT DETECTED Final   Streptococcus agalactiae NOT DETECTED NOT DETECTED Final   Streptococcus pneumoniae NOT DETECTED NOT DETECTED Final   Streptococcus pyogenes NOT DETECTED NOT DETECTED Final   Acinetobacter baumannii NOT DETECTED NOT DETECTED Final   Enterobacteriaceae species DETECTED (A) NOT DETECTED Final    Comment: Enterobacteriaceae represent a large family of gram-negative bacteria, not a single organism. CRITICAL RESULT CALLED TO, READ BACK BY AND VERIFIED WITH: [place] [phone] FCP    Enterobacter cloacae complex DETECTED (A) NOT DETECTED Final    Comment: CRITICAL RESULT CALLED TO, READ BACK BY AND VERIFIED WITH: [place] [phone] FCP    Escherichia coli NOT DETECTED NOT DETECTED Final   Klebsiella oxytoca NOT DETECTED NOT DETECTED Final   Klebsiella pneumoniae NOT DETECTED NOT DETECTED Final   Proteus species NOT DETECTED NOT DETECTED Final   Serratia marcescens NOT DETECTED NOT DETECTED Final   Carbapenem resistance NOT DETECTED NOT DETECTED Final   Haemophilus influenzae NOT DETECTED NOT DETECTED Final   Neisseria  meningitidis NOT DETECTED NOT DETECTED Final   Pseudomonas aeruginosa NOT DETECTED NOT DETECTED Final   Candida albicans NOT DETECTED NOT DETECTED Final   Candida glabrata NOT DETECTED NOT DETECTED Final   Candida krusei NOT DETECTED NOT DETECTED Final   Candida parapsilosis NOT DETECTED NOT DETECTED Final   Candida tropicalis NOT DETECTED NOT DETECTED Final    Comment: Performed at [facility], [place] [address], [place], [place] [address]  Culture, blood (routine x 2)     Status: Abnormal   Collection Time: 06/10/19  5:33 PM   Specimen: BLOOD LEFT HAND  Result Value Ref Range Status   Specimen Description   Final    BLOOD LEFT HAND Performed at [facility] [address], [place], [place] [address]    Special Requests   Final    BOTTLES DRAWN AEROBIC AND ANAEROBIC Blood Culture adequate volume Performed at [facility] [address], [place], [place] [address]    Culture  Setup Time   Final    GRAM NEGATIVE RODS IN BOTH AEROBIC AND ANAEROBIC BOTTLES CRITICAL VALUE NOTED.  VALUE IS CONSISTENT WITH PREVIOUSLY REPORTED AND CALLED VALUE.    Culture (A)  Final    ENTEROBACTER CLOACAE SUSCEPTIBILITIES PERFORMED ON PREVIOUS CULTURE WITHIN THE LAST 5 DAYS. Performed at [facility], [place] [address] [place], [place] [address]    Report Status 06/13/2019 FINAL  Final  MRSA PCR Screening     Status: None   Collection Time: 06/10/19 10:46 PM   Specimen: Nasal Mucosa; Nasopharyngeal  Result Value Ref Range Status  MRSA by PCR NEGATIVE NEGATIVE Final    Comment:        The GeneXpert MRSA Assay (FDA approved for NASAL specimens only), is one component of a comprehensive MRSA colonization surveillance program. It is not intended to diagnose MRSA infection nor to guide or monitor treatment for MRSA infections. Performed at [facility], [place] [address] [place], [place] [address]   Urine culture     Status:  Abnormal   Collection Time: 06/11/19 12:00 AM   Specimen: Urine, Random  Result Value Ref Range Status   Specimen Description   Final    URINE, RANDOM Performed at [facility] [address] [place], [place] [address]    Special Requests   Final    NONE Performed at [facility], [place] [address] [place], [place] [address]    Culture (A)  Final    <10,000 COLONIES/mL INSIGNIFICANT GROWTH Performed at [place] [address], [place], [place] [address]    Report Status 06/12/2019 FINAL  Final  Culture, Urine     Status: None   Collection Time: 06/15/19  4:04 PM   Specimen: Urine, Clean Catch  Result Value Ref Range Status   Specimen Description   Final    URINE, CLEAN CATCH Performed at [facility], [place] [address] [place], [place] [address]    Special Requests   Final    NONE Performed at [facility], [place] [address], [place], [place] [address]    Culture   Final    NO GROWTH Performed at [facility], [place] [address], [place], [place] [address]    Report Status 06/17/2019 FINAL  Final  Culture, blood (routine x 2)     Status: None (Preliminary result)   Collection Time: 06/15/19  4:38 PM   Specimen: BLOOD RIGHT ARM  Result Value Ref Range Status   Specimen Description   Final    BLOOD RIGHT ARM Performed at [place] [address], [place], [place] [address]    Special Requests   Final    BOTTLES DRAWN AEROBIC ONLY Blood Culture adequate volume Performed at [facility] [address], [place], [place] [address]    Culture   Final    NO GROWTH 2 DAYS Performed at [facility] [address] [place], [place] [address]    Report Status PENDING  Incomplete  Culture, blood (routine x 2)     Status: None (Preliminary result)   Collection Time: 06/15/19  4:38 PM   Specimen: BLOOD RIGHT HAND  Result Value Ref Range Status    Specimen Description   Final    BLOOD RIGHT HAND Performed at [place] [address], [place], [place] [address]    Special Requests   Final    BOTTLES DRAWN AEROBIC ONLY Blood Culture results may not be optimal due to an inadequate volume of blood received in culture bottles Performed at [place] [address], [place],  [address]    Culture   Final    NO GROWTH 2 DAYS Performed at [facility] [address] [place],  [address]    Report Status PENDING  Incomplete  SARS Coronavirus 2 [facility] order, Performed in [facility] hospital lab) Nasopharyngeal Nasopharyngeal Swab     Status: None   Collection Time: 06/17/19  9:30 AM   Specimen: Nasopharyngeal Swab  Result Value Ref Range Status   SARS Coronavirus 2 NEGATIVE NEGATIVE Final    Comment: (NOTE) If result is NEGATIVE SARS-CoV-2 target nucleic acids are NOT DETECTED. The SARS-CoV-2 RNA is generally detectable in upper and lower  respiratory specimens during the acute phase of infection. The lowest  concentration of SARS-CoV-2 viral copies this assay can detect is 250  copies / mL. A negative result does not preclude SARS-CoV-2 infection  and should not be used as the sole basis for treatment or other  patient management decisions.  A negative result may occur with  improper specimen collection / handling, submission of specimen other  than nasopharyngeal swab, presence of viral mutation(s) within the  areas targeted by this assay, and inadequate number of viral copies  (<250 copies / mL). A negative result must be combined with clinical  observations, patient history, and epidemiological information. If result is POSITIVE SARS-CoV-2 target nucleic acids are DETECTED. The SARS-CoV-2 RNA is generally detectable in upper and lower  respiratory specimens dur ing the acute phase of infection.  Positive  results are indicative of active infection with  SARS-CoV-2.  Clinical  correlation with patient history and other diagnostic information is  necessary to determine patient infection status.  Positive results do  not rule out bacterial infection or co-infection with other viruses. If result is PRESUMPTIVE POSTIVE SARS-CoV-2 nucleic acids MAY BE PRESENT.   A presumptive positive result was obtained on the submitted specimen  and confirmed on repeat testing.  While 2019 novel coronavirus  (SARS-CoV-2) nucleic acids may be present in the submitted sample  additional confirmatory testing may be necessary for epidemiological  and / or clinical management purposes  to differentiate between  SARS-CoV-2 and other Sarbecovirus currently known to infect humans.  If clinically indicated additional testing with an alternate test  methodology [phone]) is advised. The SARS-CoV-2 RNA is generally  detectable in upper and lower respiratory sp ecimens during the acute  phase of infection. The expected result is Negative. Fact Sheet for Patients:  StrictlyIdeas.no Fact Sheet for Healthcare Providers: BankingDealers.co.za This test is not yet approved or cleared by the [place] FDA and has been authorized for detection and/or diagnosis of SARS-CoV-2 by FDA under an Emergency Use Authorization (EUA).  This EUA will remain in effect (meaning this test can be used) for the duration of the COVID-19 declaration under Section 564(b)(1) of the Act, 21 U.S.C. section 360bbb-3(b)(1), unless the authorization is terminated or revoked sooner. Performed at [facility], [place] [address], [place], [place] [address]       Radiology Studies: Dg Chest Port 1 View  Result Date: 06/17/2019 CLINICAL DATA:  Hypoxia and respiratory failure EXAM: PORTABLE CHEST 1 VIEW COMPARISON:  June 15, 2019 FINDINGS: The mediastinal contour and cardiac silhouette are stable. Bilateral pulmonary nodules and masses  are stable. Patchy opacities are identified in bilateral lung bases. There is no pulmonary edema or pleural effusion. Bony structures are stable. IMPRESSION: Patchy consolidations identified in both lung bases which may be due to atelectasis but developing pneumonias are not excluded. Stable bilateral pulmonary nodules and masses. Electronically Signed   By: Abelardo Diesel M.D.   On: 06/17/2019 10:31      Scheduled Meds: . sodium chloride   Intravenous Once  . budesonide  0.25 mg Nebulization BID  . chlorhexidine  15 mL Mouth Rinse BID  . Chlorhexidine Gluconate Cloth  6 each Topical Daily  . hydrocortisone sod succinate (SOLU-CORTEF) inj  50 mg Intravenous  Q6H  . insulin aspart  0-9 Units Subcutaneous TID WC  . ipratropium-albuterol  3 mL Nebulization Q4H  . mouth rinse  15 mL Mouth Rinse q12n4p  . pantoprazole  40 mg Oral Daily  . simvastatin  20 mg Oral q1800  . sodium chloride flush  10-40 mL Intracatheter Q12H  . sodium chloride flush  3 mL Intravenous Q12H  . vitamin B-12  1,000 mcg Oral Daily   Continuous Infusions: . sodium chloride 100 mL/hr at 06/18/19 0800  . ceFEPime (MAXIPIME) IV Stopped (06/17/19 2150)     LOS: 8 days      Time spent: 35 minutes   Dessa Phi, DO [facility] [email or web address] 06/18/2019, 8:53 AM

## 2019-06-18 NOTE — Progress Notes (Signed)
Occupational Therapy Treatment Patient Details Name: [name] MRN: [id number] DOB: 04-25-1946 Today's Date: 06/18/2019    History of present illness 73 y o man admitted with SOB and sepsis. He was recently admitted 7/11 - 7/18 for respiratory failure and sepsis 2* e coli bacteremia in necrotic bladder tumor.  PMH:  metastatic bladder CA, COPD on 6 liters 02 at baseline, CAD, CHF, HTN anxiety and OSA   OT comments  Pt sitting EOB with legs down.  Noted BLE edema.  Encouraged pt elevate legs and pt stated he would do this when his blood came. Wife present. Pts BP 107/54   Follow Up Recommendations  Supervision/Assistance - 24 hour    Equipment Recommendations  None recommended by OT(pt feels his toilet is fine)    Recommendations for Other Services      Precautions / Restrictions Precautions Precautions: Fall       Mobility Bed Mobility               General bed mobility comments: pt sitting EOB  Transfers Overall transfer level: Needs assistance Equipment used: 1 person hand held assist Transfers: Sit to/from Stand Sit to Stand: Min assist         General transfer comment: Min A for safety.  Pt does report he is weaker than normal. Wife is aware.  Educated pt on safety with ADL activty with feeling weaker than normal    Balance Overall balance assessment: Needs assistance Sitting-balance support: No upper extremity supported Sitting balance-Leahy Scale: Good     Standing balance support: Single extremity supported Standing balance-Leahy Scale: Fair                             ADL either performed or assessed with clinical judgement   ADL Overall ADL's : Needs assistance/impaired Eating/Feeding: Set up;Sitting   Grooming: Wash/dry face;Wash/dry hands;Sitting;Set up               Lower Body Dressing: Maximal assistance;Sit to/from stand;Cueing for safety Lower Body Dressing Details (indicate cue type and reason): wife does A with  ADL activity     Toileting- Clothing Manipulation and Hygiene: Sit to/from stand;Moderate assistance Toileting - Clothing Manipulation Details (indicate cue type and reason): stand with urinal       General ADL Comments: wife does A with ADL activity.  Educated pt on sitting in shower on tub seat.  Feel thiswould be safer option for pt as he does fatigue quickly     Vision Patient Visual Report: No change from baseline            Cognition Arousal/Alertness: Awake/alert Behavior During Therapy: WFL for tasks assessed/performed Overall Cognitive Status: Within Functional Limits for tasks assessed                                                     Pertinent Vitals/ Pain       Pain Assessment: No/denies pain     Prior Functioning/Environment              Frequency  Min 2X/week        Progress Toward Goals  OT Goals(current goals can now be found in the care plan section)  Progress towards OT goals: Progressing toward goals     Plan Discharge  plan remains appropriate       AM-PAC OT "6 Clicks" Daily Activity     Outcome Measure   Help from another person eating meals?: None Help from another person taking care of personal grooming?: A Little Help from another person toileting, which includes using toliet, bedpan, or urinal?: A Lot Help from another person bathing (including washing, rinsing, drying)?: A Lot Help from another person to put on and taking off regular upper body clothing?: A Lot Help from another person to put on and taking off regular lower body clothing?: A Lot 6 Click Score: 15    End of Session Equipment Utilized During Treatment: Rolling walker;Oxygen  OT Visit Diagnosis: Unsteadiness on feet (R26.81)   Activity Tolerance Patient limited by fatigue   Patient Left in chair;with call bell/phone within reach   Nurse Communication          Time: 1610-1630 OT Time Calculation (min): 20 min  Charges: OT  General Charges $OT Visit: 1 Visit OT Treatments $Self Care/Home Management : 8-22 mins  Kari Baars, [place] [phone] Office- [phone]      Rykar Lebleu, Edwena Felty D 06/18/2019, 5:16 PM

## 2019-06-18 NOTE — Progress Notes (Signed)
Daily Progress Note   Patient Name: [name]       Date: 06/18/2019 DOB: 07-26-1946  Age: 73 y.o. MRN#: [id number] Attending Physician: Dessa Phi, DO Primary Care Physician: [name], MD Admit Date: 06/10/2019  Reason for Consultation/Follow-up: Establishing goals of care  Subjective: I saw and examined [name] today.  His wife was also present at the bedside.  He reports feeling much better today, and he takes this is a sign that God wants him to continue fighting.  We talked again about goal of living as well as possible for as long as possible but also staying in his own home and dying in his own home when that time comes.  I was briefly able to review how I thought that hospice could support him in these goals, however, he was not open to discussion and stated that he may be open to hospice "when the time comes.  But not now."  Length of Stay: 8  Current Medications: Scheduled Meds:  . sodium chloride   Intravenous Once  . budesonide  0.25 mg Nebulization BID  . chlorhexidine  15 mL Mouth Rinse BID  . Chlorhexidine Gluconate Cloth  6 each Topical Daily  . hydrocortisone sod succinate (SOLU-CORTEF) inj  50 mg Intravenous Q6H  . insulin aspart  0-9 Units Subcutaneous TID WC  . ipratropium-albuterol  3 mL Nebulization Q4H  . mouth rinse  15 mL Mouth Rinse q12n4p  . pantoprazole  40 mg Oral Daily  . simvastatin  20 mg Oral q1800  . sodium chloride flush  10-40 mL Intracatheter Q12H  . sodium chloride flush  3 mL Intravenous Q12H  . vitamin B-12  1,000 mcg Oral Daily    Continuous Infusions: . sodium chloride Stopped (06/18/19 1210)  . ceFEPime (MAXIPIME) IV      PRN Meds: acetaminophen **OR** acetaminophen, albuterol, ALPRAZolam, ipratropium-albuterol,  methocarbamol, ondansetron **OR** ondansetron (ZOFRAN) IV, polyethylene glycol, senna-docusate, sodium chloride flush, traMADol  Physical Exam     General: Alert, awake, in no acute distress. Sitting on the side of the bed. Heart: Regular rate and rhythm. No murmur appreciated. Lungs: Increased work of breathing Abdomen: Soft, nontender, nondistended, positive bowel sounds.  Ext: No significant edema Skin: Warm and dry Neuro: Grossly intact, nonfocal.      Vital Signs: BP 121/62  Pulse 82   Temp (!) 97.3 F (36.3 C)   Resp 19   Ht 5\' 6"  (1.676 m)   Wt 92.3 kg   SpO2 100%   BMI 32.84 kg/m  SpO2: SpO2: 100 % O2 Device: O2 Device: High Flow Nasal Cannula O2 Flow Rate: O2 Flow Rate (L/min): 6 L/min  Intake/output summary:   Intake/Output Summary (Last 24 hours) at 06/18/2019 1525 Last data filed at 06/18/2019 1400 Gross per 24 hour  Intake 4147.2 ml  Output 3125 ml  Net 1022.2 ml   LBM: Last BM Date: 06/17/19 Baseline Weight: Weight: 86.2 kg Most recent weight: Weight: 92.3 kg       Palliative Assessment/Data:    Flowsheet Rows     Most Recent Value  Intake Tab  Referral Department  Hospitalist  Unit at Time of Referral  ICU  Palliative Care Primary Diagnosis  Cancer  Date Notified  06/16/19  Palliative Care Type  Return patient Palliative Care  Reason for referral  Clarify Goals of Care  Date of Admission  06/10/19  Date first seen by Palliative Care  06/17/19  # of days Palliative referral response time  1 Day(s)  # of days IP prior to Palliative referral  6  Clinical Assessment  Psychosocial & Spiritual Assessment  Palliative Care Outcomes  Patient/Family meeting held?  Yes  Who was at the meeting?  Patient, wife  Palliative Care Outcomes  Clarified goals of care      Patient Active Problem List   Diagnosis Date Noted  . Acute on chronic respiratory failure ([place]) 06/10/2019  . Sepsis ([place]) 05/27/2019  . COPD exacerbation ([place]) 05/27/2019  .  Shortness of breath   . Symptomatic anemia 05/06/2019  . Anemia of chronic disease   . OSA (obstructive sleep apnea)   . SBO (small bowel obstruction) ([place]) 03/27/2019  . Nausea vomiting and diarrhea   . Weakness   . SOB (shortness of breath)   . Elevated troponin   . Palliative care by specialist   . Goals of care, counseling/discussion   . Abdominal pain   . GI bleed 02/18/2019  . DNR (do not resuscitate) 12/21/2018  . Anemia 12/14/2018  . Liver metastasis ([place]) 07/13/2018  . Acute blood loss anemia 06/07/2018  . Rectal bleeding 05/13/2018  . Chronic diastolic CHF (congestive heart failure) ([place]) 05/13/2018  . Lower GI bleed 05/13/2018  . Class 2 obesity with body mass index (BMI) of 37.0 to 37.9 in adult 01/10/2018  . 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 Acute on chronic diastolic CHF (congestive heart failure) ([place]) 01/19/2017  . Chronic respiratory failure with hypoxia and hypercapnia ([place]) 10/13/2016  . OSA treated with BiPAP 10/13/2016  . Macrocytic anemia 09/24/2016  . COPD with acute exacerbation ([place]) 09/24/2016  . Acute bronchitis 02/03/2016  . Ex-smoker 10/31/2015  . Dilated cardiomyopathy ([place])   . Pulmonary hypertension ([place])   . Acute on chronic respiratory failure with hypoxia and hypercapnia ([place]) 10/20/2015  . 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 LBP (low back pain) 03/24/2011  . Malignant neoplasm of bladder ([place]) 09/22/2010  . HEMATURIA UNSPECIFIED 05/14/2010  . Osteoarthritis 03/28/2010  . Coronary atherosclerosis of native coronary artery 12/19/2007  . Acute sinusitis 12/19/2007  . End stage COPD ([place]) 12/19/2007  . COLONIC POLYPS 12/18/2007  . HYPERCHOLESTEROLEMIA 12/18/2007  . Anxiety 12/18/2007  . Essential hypertension 12/18/2007  . GASTRITIS 12/18/2007  . 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 Acute on chronic respiratory failure ([place]) 06/10/2019  . Sepsis ([place]) 05/27/2019  . COPD exacerbation ([place]) 05/27/2019  . Shortness of breath   . Symptomatic anemia 05/06/2019  . Anemia of chronic disease   . OSA (obstructive sleep apnea)   . SBO (small bowel obstruction) ([place]) 03/27/2019  . Nausea vomiting and diarrhea   . Weakness   . SOB (shortness of breath)   . Elevated troponin   . Palliative care by specialist   . Goals of care, counseling/discussion   . Abdominal pain   . GI bleed 02/18/2019  . DNR (do not resuscitate) 12/21/2018  . Anemia 12/14/2018  . Liver metastasis ([place]) 07/13/2018  . Acute blood loss anemia 06/07/2018  . Rectal bleeding 05/13/2018  . Chronic diastolic CHF (congestive heart failure) ([place]) 05/13/2018  . Lower GI bleed 05/13/2018  . Class 2 obesity with body mass index (BMI) of 37.0 to 37.9 in adult 01/10/2018  . 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 Acute on chronic diastolic CHF (congestive heart failure) ([place]) 01/19/2017  . Chronic respiratory failure with hypoxia and hypercapnia ([place]) 10/13/2016  . OSA treated with BiPAP 10/13/2016  . Macrocytic anemia 09/24/2016  . COPD with acute exacerbation ([place]) 09/24/2016  . Acute bronchitis 02/03/2016  . Ex-smoker 10/31/2015  . Dilated cardiomyopathy ([place])   . Pulmonary hypertension ([place])   . Acute on chronic respiratory failure with hypoxia and hypercapnia ([place]) 10/20/2015  . 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 LBP (low back pain) 03/24/2011  . Malignant neoplasm of bladder ([place]) 09/22/2010  . HEMATURIA UNSPECIFIED 05/14/2010  . Osteoarthritis 03/28/2010  . Coronary atherosclerosis of native coronary artery 12/19/2007  . Acute sinusitis 12/19/2007  . End stage COPD ([place]) 12/19/2007  . COLONIC POLYPS 12/18/2007  . HYPERCHOLESTEROLEMIA 12/18/2007  .  Anxiety 12/18/2007  . Essential hypertension 12/18/2007  . GASTRITIS 12/18/2007  . Impaired fasting glucose 12/18/2007    Recommendations/Plan:  DNR/DNI but desires continuation of current care until time of cardiac or respiratory arrest.  Not open to consideration for hospice at this time.  He has been followed by outpatient palliative care and I recommend this continue on discharge.  Code Status:    Code Status Orders  (From admission, onward)         Start     Ordered   05/27/19 0355  Do not attempt resuscitation (DNR)  Continuous    Question Answer Comment  In the event of cardiac or respiratory ARREST Do not call a "code blue"   In the event of cardiac or respiratory ARREST Do not perform Intubation, CPR, defibrillation or ACLS   In the event of cardiac or respiratory ARREST Use medication by any route, position, wound care, and other measures to relive pain and suffering. May use oxygen, suction and manual treatment of airway obstruction as needed for comfort.      05/27/19 0354         Prognosis:   Guarded  Discharge Planning:  To be determined- Likely home with Palliative Services  Care plan was discussed with patient  Thank you for allowing the Palliative Medicine Team to assist in the care of this patient.   Time In: 0935 Time Out: 1005 Total Time 30 Prolonged Time Billed No  Greater than 50%  of this time was spent counseling and coordinating care related to the above assessment and plan.  Micheline Rough, MD  Please contact Palliative Medicine Team phone at [phone] for questions and concerns.

## 2019-06-18 NOTE — Consult Note (Signed)
Consultation Note Date: 06/18/2019   Patient Name: [name]  DOB: [birth year]  MRN: [id number]  Age / Sex: 73 y.o., male  PCP: [name], MD Referring Physician: Dessa Phi, DO  Reason for Consultation: Establishing goals of care  HPI/Patient Profile: 73 y.o. male   admitted on 06/10/2019  [name] is a 73 y.o. 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  He is well-known to palliative medicine team from prior encounters.  Palliative consulted for goals of care.  Clinical Assessment and Goals of Care: Palliative care consult received.  Reviewed chart including personal review of pertinent labs and imaging.  I know [name] well from prior encounters.  On arrival to the room, [name] was sitting on the side of the bed and appeared short of breath.  His wife is also present.  Prior to my even reintroducing myself, [name] stated "so she sent you to try to talk me into giving up, huh?"  We then reviewed his clinical course as well as prior expressed wish to be in his own home and remain in his own home up until the time of his death.  He reports that this is still his goal, however, he does not feel that God is "done with me yet."  We discussed continued decline he has had in his nutrition, cognition, and functional status.  His wife seems very open to discussion but defers to him to do all the talking.  [name] reports that he is not opposed to hospice services, however, he needs to reach a point where he feels that that is what God is leading him to decide.  At this point, he said that he feels God is telling him that he still  has more time and is going to improve.  He is not interested in a further discussion of hospice at this point in time, however, he is open to further palliative care follow-up once he returns to his home.  NEXT OF KIN  lives at home with [place].   SUMMARY OF RECOMMENDATIONS    DNR DNI -Patient reports his goal is to return home with continuation of home based palliative care. Patient doesn't want hospice at this time, nor does he want to discuss hospice services.  - Continue current mode of care.   Code Status/Advance Care Planning:  DNR  Palliative Prophylaxis:   Delirium Protocol  Psycho-social/Spiritual:   Desire for further Chaplaincy support:yes  Additional Recommendations: Education on Hospice  Prognosis:   Unable to determine  Discharge Planning: Home with Palliative Services      Primary Diagnoses: Present on Admission: . Acute on chronic respiratory failure ([place]) . Chronic diastolic CHF (congestive heart failure) ([place]) . End stage COPD ([place]) . Essential hypertension . Malignant neoplasm of bladder (  HCC) . OSA treated with BiPAP . Anxiety . Anemia of chronic disease . Sepsis ([place])   I have reviewed the medical record, interviewed the patient and family, and examined the patient. The following aspects are pertinent.  Past Medical History:  Diagnosis Date  . Anxiety   . Bladder cancer ([place]) 2011  . Borderline diabetes mellitus   . CAD (coronary artery disease)   . Cigarette smoker   . COPD (chronic obstructive pulmonary disease) ([place])   . DJD (degenerative joint disease)   . Emphysema of lung ([place])   . Epistaxis   . History of colonic polyps 2004   hyperplastic   . History of sinusitis   . Hypercholesteremia   . Hypertension   . Obesity    Social History   Socioeconomic History  . Marital status: Married    Spouse name: [name]  . Number of children: 2  . Years of education: Not on file  . Highest education level: Not on  file  Occupational History  . Occupation: Retired  Scientific laboratory technician  . Financial resource strain: Not hard at all  . Food insecurity    Worry: Never true    Inability: Never true  . Transportation needs    Medical: No    Non-medical: No  Tobacco Use  . Smoking status: Former Smoker    Packs/day: 2.00    Years: 45.00    Pack years: 90.00    Types: Cigarettes    Quit date: 10/17/2015    Years since quitting: 3.6  . Smokeless tobacco: Never Used  Substance and Sexual Activity  . Alcohol use: Yes    Alcohol/week: 0.0 standard drinks    Comment: glass of wine at night  . Drug use: No  . Sexual activity: Not on file  Lifestyle  . Physical activity    Days per week: 0 days    Minutes per session: 0 min  . Stress: Not at all  Relationships  . Social connections    Talks on phone: More than three times a week    Gets together: More than three times a week    Attends religious service: 1 to 4 times per year    Active member of club or organization: Yes    Attends meetings of clubs or organizations: Not on file    Relationship status: Married  Other Topics Concern  . Not on file  Social History Narrative   **Patient happily married for 54 years. Stated no problems in community or home life.   Family History  Problem Relation Age of Onset  . Heart disease Mother   . Heart disease Brother   . Cancer Brother   . Aneurysm Brother    Scheduled Meds: . sodium chloride   Intravenous Once  . budesonide  0.25 mg Nebulization BID  . chlorhexidine  15 mL Mouth Rinse BID  . Chlorhexidine Gluconate Cloth  6 each Topical Daily  . hydrocortisone sod succinate (SOLU-CORTEF) inj  50 mg Intravenous Q6H  . insulin aspart  0-9 Units Subcutaneous TID WC  . ipratropium-albuterol  3 mL Nebulization Q4H  . mouth rinse  15 mL Mouth Rinse q12n4p  . pantoprazole  40 mg Oral Daily  . simvastatin  20 mg Oral q1800  . sodium chloride flush  10-40 mL Intracatheter Q12H  . sodium chloride flush  3 mL  Intravenous Q12H  . vitamin B-12  1,000 mcg Oral Daily   Continuous Infusions: . sodium chloride Stopped (06/18/19 1210)  .  ceFEPime (MAXIPIME) IV     PRN Meds:.acetaminophen **OR** acetaminophen, albuterol, ALPRAZolam, ipratropium-albuterol, methocarbamol, ondansetron **OR** ondansetron (ZOFRAN) IV, polyethylene glycol, senna-docusate, sodium chloride flush, traMADol Medications Prior to Admission:  Prior to Admission medications   Medication Sig Start Date End Date Taking? Authorizing Provider  Albuterol Sulfate (PROAIR RESPICLICK) 841 (90 Base) MCG/ACT AEPB Inhale 2 puffs into the lungs every 6 (six) hours as needed (shortness of breath). 10/26/18   Noralee Space, MD  ALPRAZolam Duanne Moron) 0.5 MG tablet Take 1 tablet (0.5 mg total) by mouth at bedtime. 10/26/18   Noralee Space, MD  Ascorbic Acid (VITAMIN C) 1000 MG tablet Take 1,000 mg by mouth daily.    [provider]  budesonide (PULMICORT) 0.25 MG/2ML nebulizer solution Take 2 mLs (0.25 mg total) by nebulization 2 (two) times daily. 02/12/19   Garner Nash, DO  cholecalciferol (VITAMIN D) 400 units TABS tablet Take 400 Units by mouth at bedtime.    [provider]  Flaxseed, Linseed, (FLAX SEEDS PO) Take 1 capsule by mouth daily.    [provider]  fluticasone (FLONASE) 50 MCG/ACT nasal spray Place 2 sprays into both nostrils daily as needed for allergies or rhinitis.  09/21/18   [provider]  furosemide (LASIX) 40 MG tablet Take 40 mg by mouth daily as needed for fluid.     [provider]  guaiFENesin (MUCINEX) 600 MG 12 hr tablet Take 1,200 mg by mouth 2 (two) times daily.    [provider]  ipratropium-albuterol (DUONEB) 0.5-2.5 (3) MG/3ML SOLN Take 3 mLs by nebulization every 4 (four) hours as needed. Patient taking differently: Take 3 mLs by nebulization See admin instructions. Take every 4 hours. Take additional doses every 4 hours as needed for shortness of breath.  02/12/19   Icard, Octavio Graves, DO  methocarbamol (ROBAXIN) 500 MG tablet Take 1,000 mg by mouth at bedtime.  03/07/18   [provider]  multivitamin-lutein (OCUVITE-LUTEIN) CAPS capsule Take 1 capsule by mouth 2 (two) times a day.     [provider]  nitroGLYCERIN (NITROSTAT) 0.4 MG SL tablet Place 1 tablet (0.4 mg total) under the tongue every 5 (five) minutes as needed for chest pain. 11/19/15   Richardson Dopp T, PA-C  ondansetron (ZOFRAN-ODT) 8 MG disintegrating tablet Take 8 mg by mouth every 8 (eight) hours as needed for nausea/vomiting. 04/02/19   [provider]  pantoprazole (PROTONIX) 40 MG tablet Take 40 mg by mouth daily. 03/03/19   [provider]  Polyethyl Glycol-Propyl Glycol (SYSTANE OP) Apply 1 drop to eye every 6 (six) hours as needed (dry eyes).     [provider]  simvastatin (ZOCOR) 20 MG tablet Take 1 tablet (20 mg total) by mouth daily at 6 PM. 11/19/15   Kathlen Mody, Nicki Reaper T, PA-C  sodium chloride (OCEAN) 0.65 % SOLN nasal spray Place 1 spray into both nostrils as needed for congestion. Patient taking differently: Place 1 spray into both nostrils daily as needed for congestion.  10/29/15   Cherene Altes, MD  traMADol (ULTRAM) 50 MG tablet Take 1 tablet (50 mg total) by mouth every 6 (six) hours as needed for moderate pain. 03/30/19   Eugenie Filler, MD  vitamin B-12 (CYANOCOBALAMIN) 1000 MCG tablet Take 1,000 mcg by mouth daily.    [provider]   Allergies  Allergen Reactions  . Amlodipine Swelling   Review of Systems +shortness of breath.  Physical Exam Awake alert Sitting by the edge of  the bed Trace edema LE Abdomen distended S 1 S 2  Decreased air movement.   Vital Signs: BP 121/62   Pulse 82   Temp (!) 97.3 F (36.3 C)   Resp 19   Ht 5\' 6"  (1.676 m)   Wt 92.3 kg   SpO2 100%   BMI 32.84 kg/m  Pain Scale: 0-10   Pain Score: 0-No pain   SpO2: SpO2: 100 % O2 Device:SpO2: 100 % O2 Flow Rate: .O2  Flow Rate (L/min): 6 L/min  IO: Intake/output summary:   Intake/Output Summary (Last 24 hours) at 06/18/2019 1519 Last data filed at 06/18/2019 1400 Gross per 24 hour  Intake 4147.2 ml  Output 3125 ml  Net 1022.2 ml    LBM: Last BM Date: 06/17/19 Baseline Weight: Weight: 86.2 kg Most recent weight: Weight: 92.3 kg     Palliative Assessment/Data:  PPS 40% Flowsheet Rows     Most Recent Value  Intake Tab  Referral Department  Hospitalist  Unit at Time of Referral  ICU  Palliative Care Primary Diagnosis  Cancer  Date Notified  06/16/19  Palliative Care Type  Return patient Palliative Care  Reason for referral  Clarify Goals of Care  Date of Admission  06/10/19  Date first seen by Palliative Care  06/17/19  # of days Palliative referral response time  1 Day(s)  # of days IP prior to Palliative referral  6  Clinical Assessment  Psychosocial & Spiritual Assessment  Palliative Care Outcomes  Patient/Family meeting held?  Yes  Who was at the meeting?  Patient, wife  Palliative Care Outcomes  Clarified goals of care      Time In:  1600 Time Out:1700 Time Total : 60 min    Greater than 50%  of this time was spent counseling and coordinating care related to the above assessment and plan.  Signed by: Micheline Rough, MD  Please contact Palliative Medicine Team phone at [phone] for questions and concerns.  For individual provider: See Shea Evans

## 2019-06-19 LAB — BPAM RBC
Blood Product Expiration Date: 202008242359
Blood Product Expiration Date: 202008252359
ISSUE DATE / TIME: 202008031158
ISSUE DATE / TIME: 202008031638
Unit Type and Rh: 6200
Unit Type and Rh: 6200

## 2019-06-19 LAB — BASIC METABOLIC PANEL
Anion gap: 8 (ref 5–15)
BUN: 31 mg/dL — ABNORMAL HIGH (ref 8–23)
CO2: 21 mmol/L — ABNORMAL LOW (ref 22–32)
Calcium: 7.9 mg/dL — ABNORMAL LOW (ref 8.9–10.3)
Chloride: 103 mmol/L (ref 98–111)
Creatinine, Ser: 0.83 mg/dL (ref 0.61–1.24)
GFR calc Af Amer: >60 mL/min (ref >60)
GFR calc non Af Amer: >60 mL/min (ref >60)
Glucose, Bld: 219 mg/dL — ABNORMAL HIGH (ref 70–99)
Potassium: 4.7 mmol/L (ref 3.5–5.1)
Sodium: 132 mmol/L — ABNORMAL LOW (ref 135–145)

## 2019-06-19 LAB — TYPE AND SCREEN
ABO/RH(D): A POS
Antibody Screen: NEGATIVE
Unit division: 0
Unit division: 0

## 2019-06-19 LAB — GLUCOSE, CAPILLARY
Glucose-Capillary: 198 mg/dL — ABNORMAL HIGH (ref 70–99)
Glucose-Capillary: 240 mg/dL — ABNORMAL HIGH (ref 70–99)

## 2019-06-19 LAB — CBC
HCT: 35.9 % — ABNORMAL LOW (ref 39.0–52.0)
Hemoglobin: 11.5 g/dL — ABNORMAL LOW (ref 13.0–17.0)
MCH: 30.3 pg (ref 26.0–34.0)
MCHC: 32 g/dL (ref 30.0–36.0)
MCV: 94.7 fL (ref 80.0–100.0)
Platelets: 130 10*3/uL — ABNORMAL LOW (ref 150–400)
RBC: 3.79 MIL/uL — ABNORMAL LOW (ref 4.22–5.81)
RDW: 16 % — ABNORMAL HIGH (ref 11.5–15.5)
WBC: 9.3 10*3/uL (ref 4.0–10.5)
nRBC: 0.2 % (ref 0.0–0.2)

## 2019-06-19 MED ORDER — OXYCODONE HCL 5 MG PO TABS
5.0000 mg | ORAL_TABLET | Freq: Once | ORAL | Status: AC | PRN
  Administered 2019-06-19: 11:00:00 5 mg via ORAL
  Filled 2019-06-19: qty 1

## 2019-06-19 MED ORDER — CIPROFLOXACIN HCL 500 MG PO TABS: 500.0000 mg | ORAL_TABLET | Freq: Two times a day (BID) | ORAL | 0 refills | Status: AC

## 2019-06-19 MED ORDER — CEFDINIR 300 MG PO CAPS: 300.0000 mg | ORAL_CAPSULE | Freq: Two times a day (BID) | ORAL | 0 refills | Status: AC

## 2019-06-19 NOTE — Discharge Summary (Addendum)
Physician Discharge Summary  [name] IEP:[id number] DOB: 09-Feb-1946 DOA: 06/10/2019  PCP: [name], MD  Admit date: 06/10/2019 Discharge date: 06/19/2019  Admitted From: Home Disposition:  Home  Recommendations for Outpatient Follow-up:  1. Follow up with PCP in 1 week 2. Follow up with Outpatient Palliative Care services 3. Please obtain CBC in 1 week   Discharge Condition: Stable CODE STATUS: DNR  Diet recommendation: Heart healthy   Brief/Interim Summary: Andrew Kozar Wickeris a 73 y.o.malewith medical history significant formetastatic bladder cancer, COPD with chronic respiratory failure on 6 L supplemental O2 via [place], CAD s/p PCI with DES, chronic diastolic CHF, hypertension, hyperlipidemia, anemia of chronic disease, anxiety, and OSA on BiPAP whopresents to the ED for evaluation of dyspnea.  Patient was recently admitted from 05/26/2019-06/02/2019 at which time he was treated for hypoxic respiratory failure secondary to COPD exacerbation. He was also found to have sepsis due to E. coli bacteremia felt secondary to necrotic bladder tumor.  Patient was feeling well until today when he developed new onset shortness of breath when he got up to go outside of his house. He had noticed some wheezing as well. He states that he felt his lungs were getting tight and he denies using home nebulizer treatment and time therefore called EMS. He denies any chest pain, palpitations, dysuria. He has abdominal pain from his bladder tumor. He reports swelling in both of his legs which he feels is stable.  Per ED documentation patient was on his home 6 L O2 via [place] with O2 saturation in the 80s on EMS arrival. He was placed on BiPAP with relief. When taken off BiPAP he again became very short of breath. He was also noted to have a temperature 102 Fahrenheit. He was subsequently brought to the ED for further evaluation.  Patient was seen by hospital service admitted to SDU and  treated for acute on chronic respiratory failure with COPD exacerbation, sepsis with bacteremia gram-negative rods,and elevated troponins with no interventional recommendations by cardiology.   He was doing well and supposed to discharge home 8/1 afternoon. Work up showed new pneumonia, antibiotics broadened back to cefepime. Repeat COVID negative. Patient received IVF boluses for hypotension.   On day of discharge, his BP had remained stable > 24 hours, breathing back to his baseline level, afebrile > 48 hours. Patient was feeling much better and eager to go home. Wife at bedside with agreement.   Discharge Diagnoses:  Principal Problem:   Acute on chronic respiratory failure (HCC) Active Problems:   Malignant neoplasm of bladder (HCC)   Anxiety   Essential hypertension   End stage COPD (HCC)   OSA treated with BiPAP   Chronic diastolic CHF (congestive heart failure) (HCC)   Anemia of chronic disease   Sepsis ([place])    Acute on chronic respiratory failuredue to exacerbation of COPD -On 6L [place] O2 at baseline. BiPAP prn   Severe sepsis secondary to HCAP, not POA, with hypotension  -Due to continued fevers 8/1-8/2, CXR obtained which revealed patchy consolidation bases which could be developing pneumonia. Antibiotics back to IV cefepime. Repeat COVID negative.  -BP improved  -Cefepime --> Omnicef for discharge home   AKI -Resolved with IVF   Sepsis secondary to Enterobacter bacteremia  -Recent E. coli bacteremia now presenting with fever, leukocytosis, tachypnea, tachycardia. Urinalysis negative, however obtained after patient received antibiotics. Sepsis POA -Blood culture 7/26 sensitive to cefepime, Cipro -He was transitioned to Cipro in anticipation of discharge, however having fevers now.  Repeat blood cultures 7/31 negative. Repeat urine culture 7/31 negative.  -Resolved. Complete cipro   Elevated troponin in setting of CADs/p PCI w/ DES in 2008 -Suspectdemand  injuryfrom respiratory failure and sepsis as above. Per EDP discussion with cardiology he is not aninterventional candidate. 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 Status post radiation therapy, neurosurgery and chemotherapy per patient. Follows with palliative care.   Anemia of chronic disease and symptomatic anemia  -Transfused total 4 unit packed red blood cell this admission -Hemoglobin 11.5 today   Hypertension -Resume metoprolol   Hyperlipidemia -Continue simvastatin  Anxiety -Continue home Xanax as needed  OSA -Continue BiPAP nightly  Hyponatremia -Stable 132 today    Discharge Instructions  Discharge Instructions    (HEART FAILURE PATIENTS) Call MD:  Anytime you have any of the following symptoms: 1) 3 pound weight gain in 24 hours or 5 pounds in 1 week 2) shortness of breath, with or without a dry hacking cough 3) swelling in the hands, feet or stomach 4) if you have to sleep on extra pillows at night in order to breathe.   Complete by: As directed    Call MD for:  difficulty breathing, headache or visual disturbances   Complete by: As directed    Call MD for:  extreme fatigue   Complete by: As directed    Call MD for:  persistant dizziness or light-headedness   Complete by: As directed    Call MD for:  persistant nausea and vomiting   Complete by: As directed    Call MD for:  severe uncontrolled pain   Complete by: As directed    Call MD for:  temperature >100.4   Complete by: As directed    Diet - low sodium heart healthy   Complete by: As directed    Discharge instructions   Complete by: As directed    You were cared for by a  hospitalist during your hospital stay. If you have any questions about your discharge medications or the care you received while you were in the hospital after you are discharged, you can call the unit and ask to speak with the hospitalist on call if the hospitalist that took care of you is not available. Once you are discharged, your primary care physician will handle any further medical issues. Please note that NO REFILLS for any discharge medications will be authorized once you are discharged, as it is imperative that you return to your primary care physician (or establish a relationship with a primary care physician if you do not have one) for your aftercare needs so that they can reassess your need for medications and monitor your lab values.   Increase activity slowly   Complete by: As directed      Allergies as of 06/19/2019      Reactions   Amlodipine Swelling      Medication List    STOP taking these medications   predniSONE 10 MG tablet Commonly known as: DELTASONE     TAKE these medications   Albuterol Sulfate 108 (90 Base) MCG/ACT Aepb Commonly known as: ProAir RespiClick Inhale 2 puffs into the lungs every 6 (six) hours as needed (shortness of breath).   ALPRAZolam 0.5 MG tablet Commonly  known as: XANAX Take 1 tablet (0.5 mg total) by mouth at bedtime.   budesonide 0.25 MG/2ML nebulizer solution Commonly known as: PULMICORT Take 2 mLs (0.25 mg total) by nebulization 2 (two) times daily.   cefdinir 300 MG capsule Commonly known as: OMNICEF Take 1 capsule (300 mg total) by mouth 2 (two) times daily for 5 days.   cholecalciferol 10 MCG (400 UNIT) Tabs tablet Commonly known as: VITAMIN D3 Take 400 Units by mouth at bedtime.   ciprofloxacin 500 MG tablet Commonly known as: CIPRO Take 1 tablet (500 mg total) by mouth 2 (two) times daily for 5 days.   FLAX SEEDS PO Take 1 capsule by mouth daily.   fluticasone 50 MCG/ACT nasal spray Commonly known as: FLONASE Place 2  sprays into both nostrils daily as needed for allergies or rhinitis.   furosemide 40 MG tablet Commonly known as: LASIX Take 40 mg by mouth daily as needed for fluid.   guaiFENesin 600 MG 12 hr tablet Commonly known as: MUCINEX Take 1,200 mg by mouth 2 (two) times daily.   ipratropium-albuterol 0.5-2.5 (3) MG/3ML Soln Commonly known as: DUONEB Take 3 mLs by nebulization every 4 (four) hours as needed. What changed:   when to take this  additional instructions   methocarbamol 500 MG tablet Commonly known as: ROBAXIN Take 1,000 mg by mouth at bedtime.   metoprolol tartrate 25 MG tablet Commonly known as: LOPRESSOR Take 25 mg by mouth 2 (two) times a day.   multivitamin-lutein Caps capsule Take 1 capsule by mouth 2 (two) times a day.   nitroGLYCERIN 0.4 MG SL tablet Commonly known as: NITROSTAT Place 1 tablet (0.4 mg total) under the tongue every 5 (five) minutes as needed for chest pain.   ondansetron 8 MG disintegrating tablet Commonly known as: ZOFRAN-ODT Take 8 mg by mouth every 8 (eight) hours as needed for nausea/vomiting.   pantoprazole 40 MG tablet Commonly known as: PROTONIX Take 40 mg by mouth daily.   simvastatin 20 MG tablet Commonly known as: ZOCOR Take 1 tablet (20 mg total) by mouth daily at 6 PM.   sodium chloride 0.65 % Soln nasal spray Commonly known as: OCEAN Place 1 spray into both nostrils as needed for congestion. What changed: when to take this   SYSTANE OP Apply 1 drop to eye every 6 (six) hours as needed (dry eyes).   traMADol 50 MG tablet Commonly known as: ULTRAM Take 1 tablet (50 mg total) by mouth every 6 (six) hours as needed for moderate pain.   vitamin B-12 1000 MCG tablet Commonly known as: CYANOCOBALAMIN Take 1,000 mcg by mouth daily.   vitamin C 1000 MG tablet Take 1,000 mg by mouth daily.      Follow-up Information    [name], MD. Schedule an appointment as soon as possible for a visit.   Specialty: Internal  Medicine Contact information: [place] [address] [phone]          Allergies  Allergen Reactions  . Amlodipine Swelling    Consultations:  Palliative care    Procedures/Studies: Ct Angio Chest Pe W And/or Wo Contrast  Result Date: 06/10/2019 CLINICAL DATA:  Shortness of breath, fever. 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 EXAM: CT ANGIOGRAPHY CHEST WITH CONTRAST TECHNIQUE: Multidetector CT imaging of the chest was performed using the standard protocol during bolus administration of intravenous contrast. Multiplanar CT image reconstructions and MIPs were obtained to evaluate the vascular anatomy. CONTRAST:  144mL  OMNIPAQUE IOHEXOL 350 MG/ML SOLN COMPARISON:  Chest radiograph dated 06/10/2019. CTA chest dated 05/27/2019. FINDINGS: Motion degraded images. Cardiovascular: Satisfactory opacification of the bilateral pulmonary arteries to the segmental level. No evidence pulmonary embolism. No evidence of thoracic aortic aneurysm or dissection. Atherosclerotic calcifications of the aortic arch. The heart is normal in size.  No pericardial effusion. 3 vessel coronary atherosclerosis. Mediastinum/Nodes: No suspicious mediastinal lymphadenopathy. Suspected 18 mm right isthmic thyroid nodule (series 4/image 11), not well visualized on the prior study, although of questionable clinical significance in this patient. Lungs/Pleura: Multiple bilateral pulmonary metastases, unchanged from recent prior, although better evaluated on the current study due to reduced motion degradation. Index lesions are as follows: --21 mm right upper lobe nodule (series 6/image 39), previously measured as 19 mm --24 mm superior segment left lower lobe nodule (series 6/image 55), previously measured as 24 mm --27 mm central right lower lobe nodule (series 6/image 109), previously measured as 35 mm Extensive centrilobular and paraseptal emphysematous changes,  upper lobe predominant. No focal consolidation. No pleural effusion or pneumothorax. Upper Abdomen: Embolization coils within the left hepatic lobe with adjacent incompletely visualized treated left hepatic lobe lesion (series 4/image 88). Additional poorly evaluated lesion in the central hepatic dome (series 4/image 78). Cholelithiasis. 6.5 cm right renal cyst. The superior aspect of the known large right abdominal mass is incompletely visualized (series 4/image 111). Musculoskeletal: Degenerative changes of the visualized thoracolumbar spine. Review of the MIP images confirms the above findings. IMPRESSION: No evidence of pulmonary embolism. Bilateral pulmonary metastases, better evaluate than on the recent prior, likely grossly unchanged. Large right abdominal mass is incompletely visualized. Hepatic metastases, poorly evaluated. Additional ancillary findings as above. Aortic Atherosclerosis (ICD10-I70.0) and Emphysema (ICD10-J43.9). Electronically Signed   By: Julian Hy M.D.   On: 06/10/2019 21:10    Dg Chest Port 1 View  Result Date: 06/17/2019 CLINICAL DATA:  Hypoxia and respiratory failure EXAM: PORTABLE CHEST 1 VIEW COMPARISON:  June 15, 2019 FINDINGS: The mediastinal contour and cardiac silhouette are stable. Bilateral pulmonary nodules and masses are stable. Patchy opacities are identified in bilateral lung bases. There is no pulmonary edema or pleural effusion. Bony structures are stable. IMPRESSION: Patchy consolidations identified in both lung bases which may be due to atelectasis but developing pneumonias are not excluded. Stable bilateral pulmonary nodules and masses. Electronically Signed   By: Abelardo Diesel M.D.   On: 06/17/2019 10:31   Dg Chest Port 1 View  Result Date: 06/15/2019 CLINICAL DATA:  Fever EXAM: PORTABLE CHEST 1 VIEW COMPARISON:  06/13/2019.  Chest CT 06/10/2019. FINDINGS: There is hyperinflation of the lungs compatible with COPD. Bilateral pulmonary nodules/masses  compatible with metastases as seen on prior imaging. No acute confluent airspace opacities or effusions. Heart is normal size. IMPRESSION: COPD.  Stable pulmonary metastases. No acute findings. Electronically Signed   By: Rolm Baptise M.D.   On: 06/15/2019 19:52   Dg Chest Port 1 View  Result Date: 06/13/2019 CLINICAL DATA:  Shortness of breath and fever. History of metastatic sarcoma. EXAM: PORTABLE CHEST 1 VIEW COMPARISON:  Multiple previous recent chest x-rays and chest CTs. FINDINGS: The cardiac silhouette, mediastinal and hilar contours are within normal limits and stable. Stable advanced emphysematous changes and pulmonary scarring. Stable pulmonary metastatic lesions. Stable basilar compressive atelectasis and vascular crowding. No pleural effusions. IMPRESSION: Stable emphysematous changes, pulmonary scarring and metastatic pulmonary nodules. No acute overlying pulmonary process. Electronically Signed   By: Marijo Sanes M.D.   On: 06/13/2019 08:13  Dg Chest Port 1 View  Result Date: 06/10/2019 CLINICAL DATA:  Shortness of breath, fever, history bladder cancer, emphysema, COPD, former smoker, coronary artery disease, hypertension EXAM: PORTABLE CHEST 1 VIEW COMPARISON:  Portable exam 1723 hours compared to 05/26/2019 and correlated with CT chest of 05/27/2019 FINDINGS: Normal heart size, mediastinal contours, and pulmonary vascularity. Atherosclerotic calcification aorta. Emphysematous and minimal bronchitic changes consistent with COPD. BILATERAL pulmonary nodules consistent with metastases. Bibasilar atelectasis versus fibrosis unchanged. No acute infiltrate, pleural effusion, or pneumothorax. No acute osseous findings. IMPRESSION: Changes of COPD with bibasilar chronic interstitial lung disease/fibrosis versus atelectasis. BILATERAL pulmonary metastases. No acute abnormalities. Electronically Signed   By: Lavonia Dana M.D.   On: 06/10/2019 18:15     Discharge Exam: Vitals:   06/19/19 0903  06/19/19 1200  BP: 127/76   Pulse: 97   Resp: (!) 27   Temp:  (!) 97.5 F (36.4 C)  SpO2: 97%      General: Pt is alert, awake, not in acute distress Cardiovascular: RRR, S1/S2 +, no rubs, no gallops Respiratory: Diminished breath sounds bilaterally, in no acute respiratory distress Abdominal: Soft, NT, ND, bowel sounds + Extremities: +1 bilateral edema, no cyanosis    The results of significant diagnostics from this hospitalization (including imaging, microbiology, ancillary and laboratory) are listed below for reference.     Microbiology: Recent Results (from the past 240 hour(s))  SARS Coronavirus 2 (CEPHEID- Performed in [facility] hospital lab), Hosp Order     Status: None   Collection Time: 06/10/19  5:28 PM   Specimen: Nasopharyngeal Swab  Result Value Ref Range Status   SARS Coronavirus 2 NEGATIVE NEGATIVE Final    Comment: (NOTE) If result is NEGATIVE SARS-CoV-2 target nucleic acids are NOT DETECTED. The SARS-CoV-2 RNA is generally detectable in upper and lower  respiratory specimens during the acute phase of infection. The lowest  concentration of SARS-CoV-2 viral copies this assay can detect is 250  copies / mL. A negative result does not preclude SARS-CoV-2 infection  and should not be used as the sole basis for treatment or other  patient management decisions.  A negative result may occur with  improper specimen collection / handling, submission of specimen other  than nasopharyngeal swab, presence of viral mutation(s) within the  areas targeted by this assay, and inadequate number of viral copies  (<250 copies / mL). A negative result must be combined with clinical  observations, patient history, and epidemiological information. If result is POSITIVE SARS-CoV-2 target nucleic acids are DETECTED. The SARS-CoV-2 RNA is generally detectable in upper and lower  respiratory specimens dur ing the acute phase of infection.  Positive  results are indicative of  active infection with SARS-CoV-2.  Clinical  correlation with patient history and other diagnostic information is  necessary to determine patient infection status.  Positive results do  not rule out bacterial infection or co-infection with other viruses. If result is PRESUMPTIVE POSTIVE SARS-CoV-2 nucleic acids MAY BE PRESENT.   A presumptive positive result was obtained on the submitted specimen  and confirmed on repeat testing.  While 2019 novel coronavirus  (SARS-CoV-2) nucleic acids may be present in the submitted sample  additional confirmatory testing may be necessary for epidemiological  and / or clinical management purposes  to differentiate between  SARS-CoV-2 and other Sarbecovirus currently known to infect humans.  If clinically indicated additional testing with an alternate test  methodology [phone]) is advised. The SARS-CoV-2 RNA is generally  detectable in upper and lower  respiratory sp ecimens during the acute  phase of infection. The expected result is Negative. Fact Sheet for Patients:  StrictlyIdeas.no Fact Sheet for Healthcare Providers: BankingDealers.co.za This test is not yet approved or cleared by the [place] FDA and has been authorized for detection and/or diagnosis of SARS-CoV-2 by FDA under an Emergency Use Authorization (EUA).  This EUA will remain in effect (meaning this test can be used) for the duration of the COVID-19 declaration under Section 564(b)(1) of the Act, 21 U.S.C. section 360bbb-3(b)(1), unless the authorization is terminated or revoked sooner. Performed at [facility], [place] [address], [place], [place] [address]   Culture, blood (routine x 2)     Status: Abnormal   Collection Time: 06/10/19  5:28 PM   Specimen: Left Antecubital; Blood  Result Value Ref Range Status   Specimen Description   Final    LEFT ANTECUBITAL Performed at [facility]  [address] [place], [place] [address]    Special Requests   Final    BOTTLES DRAWN AEROBIC AND ANAEROBIC Blood Culture adequate volume Performed at [facility] [address] [place], [place] [address]    Culture  Setup Time   Final    GRAM NEGATIVE RODS IN BOTH AEROBIC AND ANAEROBIC BOTTLES CRITICAL RESULT CALLED TO, READ BACK BY AND VERIFIED WITH: [place] [id number] FCP Performed at [facility], [place] [address], [place], [place] [address]    Culture ENTEROBACTER CLOACAE (A)  Final   Report Status 06/13/2019 FINAL  Final   Organism ID, Bacteria ENTEROBACTER CLOACAE  Final      Susceptibility   Enterobacter cloacae - MIC*    CEFAZOLIN >=64 RESISTANT Resistant     CEFEPIME 2 SENSITIVE Sensitive     CEFTAZIDIME >=64 RESISTANT Resistant     CEFTRIAXONE >=64 RESISTANT Resistant     CIPROFLOXACIN <=0.25 SENSITIVE Sensitive     GENTAMICIN <=1 SENSITIVE Sensitive     IMIPENEM <=0.25 SENSITIVE Sensitive     TRIMETH/SULFA <=20 SENSITIVE Sensitive     PIP/TAZO >=128 RESISTANT Resistant     * ENTEROBACTER CLOACAE  Blood Culture ID Panel (Reflexed)     Status: Abnormal   Collection Time: 06/10/19  5:28 PM  Result Value Ref Range Status   Enterococcus species NOT DETECTED NOT DETECTED Final   Listeria monocytogenes NOT DETECTED NOT DETECTED Final   Staphylococcus species NOT DETECTED NOT DETECTED Final   Staphylococcus aureus (BCID) NOT DETECTED NOT DETECTED Final   Streptococcus species NOT DETECTED NOT DETECTED Final   Streptococcus agalactiae NOT DETECTED NOT DETECTED Final   Streptococcus pneumoniae NOT DETECTED NOT DETECTED Final   Streptococcus pyogenes NOT DETECTED NOT DETECTED Final   Acinetobacter baumannii NOT DETECTED NOT DETECTED Final   Enterobacteriaceae species DETECTED (A) NOT DETECTED Final    Comment: Enterobacteriaceae represent a large family of gram-negative bacteria, not a single organism. CRITICAL RESULT CALLED TO, READ BACK BY  AND VERIFIED WITH: [place] [id number] FCP    Enterobacter cloacae complex DETECTED (A) NOT DETECTED Final    Comment: CRITICAL RESULT CALLED TO, READ BACK BY AND VERIFIED WITH: [place] [id number] FCP    Escherichia coli NOT DETECTED NOT DETECTED Final   Klebsiella oxytoca NOT DETECTED NOT DETECTED Final   Klebsiella pneumoniae NOT DETECTED NOT DETECTED Final   Proteus species NOT DETECTED NOT DETECTED Final   Serratia marcescens NOT DETECTED NOT DETECTED Final   Carbapenem resistance NOT  DETECTED NOT DETECTED Final   Haemophilus influenzae NOT DETECTED NOT DETECTED Final   Neisseria meningitidis NOT DETECTED NOT DETECTED Final   Pseudomonas aeruginosa NOT DETECTED NOT DETECTED Final   Candida albicans NOT DETECTED NOT DETECTED Final   Candida glabrata NOT DETECTED NOT DETECTED Final   Candida krusei NOT DETECTED NOT DETECTED Final   Candida parapsilosis NOT DETECTED NOT DETECTED Final   Candida tropicalis NOT DETECTED NOT DETECTED Final    Comment: Performed at [facility], [place] [address], [place], [place] [address]  Culture, blood (routine x 2)     Status: Abnormal   Collection Time: 06/10/19  5:33 PM   Specimen: BLOOD LEFT HAND  Result Value Ref Range Status   Specimen Description   Final    BLOOD LEFT HAND Performed at [facility] [address], [place], [place] [address]    Special Requests   Final    BOTTLES DRAWN AEROBIC AND ANAEROBIC Blood Culture adequate volume Performed at [facility] [address] [place], [place] [address]    Culture  Setup Time   Final    GRAM NEGATIVE RODS IN BOTH AEROBIC AND ANAEROBIC BOTTLES CRITICAL VALUE NOTED.  VALUE IS CONSISTENT WITH PREVIOUSLY REPORTED AND CALLED VALUE.    Culture (A)  Final    ENTEROBACTER CLOACAE SUSCEPTIBILITIES PERFORMED ON PREVIOUS CULTURE WITHIN THE LAST 5 DAYS. Performed at [facility], [place] [address], [place], [place] [address]     Report Status 06/13/2019 FINAL  Final  MRSA PCR Screening     Status: None   Collection Time: 06/10/19 10:46 PM   Specimen: Nasal Mucosa; Nasopharyngeal  Result Value Ref Range Status   MRSA by PCR NEGATIVE NEGATIVE Final    Comment:        The GeneXpert MRSA Assay (FDA approved for NASAL specimens only), is one component of a comprehensive MRSA colonization surveillance program. It is not intended to diagnose MRSA infection nor to guide or monitor treatment for MRSA infections. Performed at [facility], [place] [address] [place], [place] [address]   Urine culture     Status: Abnormal   Collection Time: 06/11/19 12:00 AM   Specimen: Urine, Random  Result Value Ref Range Status   Specimen Description   Final    URINE, RANDOM Performed at [facility] [address] [place], [place] [address]    Special Requests   Final    NONE Performed at [facility], [place] [address], [place], [place] [address]    Culture (A)  Final    <10,000 COLONIES/mL INSIGNIFICANT GROWTH Performed at [facility] [address], [place], [place] [address]    Report Status 06/12/2019 FINAL  Final  Culture, Urine     Status: None   Collection Time: 06/15/19  4:04 PM   Specimen: Urine, Clean Catch  Result Value Ref Range Status   Specimen Description   Final    URINE, CLEAN CATCH Performed at [facility], [place] [address], [place], [place] [address]    Special Requests   Final    NONE Performed at [facility], [place] [address], [place], [place] [address]    Culture   Final    NO GROWTH Performed at [facility], [place] [address] [place], [place] [address]    Report Status 06/17/2019 FINAL  Final  Culture, blood (routine x 2)  Status: None (Preliminary result)   Collection Time: 06/15/19  4:38 PM   Specimen: BLOOD RIGHT ARM  Result Value Ref Range Status   Specimen Description   Final     BLOOD RIGHT ARM Performed at [place] [address], [place], [place] [address]    Special Requests   Final    BOTTLES DRAWN AEROBIC ONLY Blood Culture adequate volume Performed at [place] [address], [place], [place] [address]    Culture   Final    NO GROWTH 4 DAYS Performed at [facility], [place] [address] [place], [place] [address]    Report Status PENDING  Incomplete  Culture, blood (routine x 2)     Status: None (Preliminary result)   Collection Time: 06/15/19  4:38 PM   Specimen: BLOOD RIGHT HAND  Result Value Ref Range Status   Specimen Description   Final    BLOOD RIGHT HAND Performed at [facility] [facility] [address], [place], [place] [address]    Special Requests   Final    BOTTLES DRAWN AEROBIC ONLY Blood Culture results may not be optimal due to an inadequate volume of blood received in culture bottles Performed at [facility] [address], [place], [place] [address]    Culture   Final    NO GROWTH 4 DAYS Performed at [facility], [place] [address], [place],  [address]    Report Status PENDING  Incomplete  SARS Coronavirus 2 [facility] order, Performed in [facility] hospital lab) Nasopharyngeal Nasopharyngeal Swab     Status: None   Collection Time: 06/17/19  9:30 AM   Specimen: Nasopharyngeal Swab  Result Value Ref Range Status   SARS Coronavirus 2 NEGATIVE NEGATIVE Final    Comment: (NOTE) If result is NEGATIVE SARS-CoV-2 target nucleic acids are NOT DETECTED. The SARS-CoV-2 RNA is generally detectable in upper and lower  respiratory specimens during the acute phase of infection. The lowest  concentration of SARS-CoV-2 viral copies this assay can detect is 250  copies / mL. A negative result does not preclude SARS-CoV-2 infection  and should not be used as the sole basis for treatment or other  patient management decisions.  A negative result may  occur with  improper specimen collection / handling, submission of specimen other  than nasopharyngeal swab, presence of viral mutation(s) within the  areas targeted by this assay, and inadequate number of viral copies  (<250 copies / mL). A negative result must be combined with clinical  observations, patient history, and epidemiological information. If result is POSITIVE SARS-CoV-2 target nucleic acids are DETECTED. The SARS-CoV-2 RNA is generally detectable in upper and lower  respiratory specimens dur ing the acute phase of infection.  Positive  results are indicative of active infection with SARS-CoV-2.  Clinical  correlation with patient history and other diagnostic information is  necessary to determine patient infection status.  Positive results do  not rule out bacterial infection or co-infection with other viruses. If result is PRESUMPTIVE POSTIVE SARS-CoV-2 nucleic acids MAY BE PRESENT.   A presumptive positive result was obtained on the submitted specimen  and confirmed on repeat testing.  While 2019 novel coronavirus  (SARS-CoV-2) nucleic acids may be present in the submitted sample  additional confirmatory testing may be necessary for epidemiological  and / or clinical management purposes  to differentiate between  SARS-CoV-2 and other Sarbecovirus currently known to infect humans.  If clinically indicated  additional testing with an alternate test  methodology [phone]) is advised. The SARS-CoV-2 RNA is generally  detectable in upper and lower respiratory sp ecimens during the acute  phase of infection. The expected result is Negative. Fact Sheet for Patients:  StrictlyIdeas.no Fact Sheet for Healthcare Providers: BankingDealers.co.za This test is not yet approved or cleared by the [place] FDA and has been authorized for detection and/or diagnosis of SARS-CoV-2 by FDA under an Emergency Use Authorization (EUA).  This  EUA will remain in effect (meaning this test can be used) for the duration of the COVID-19 declaration under Section 564(b)(1) of the Act, 21 U.S.C. section 360bbb-3(b)(1), unless the authorization is terminated or revoked sooner. Performed at [facility], [place] [address], [place], [place] [address]      Labs: BNP (last 3 results) Recent Labs    05/06/19 0101 05/26/19 2327 06/10/19 1727  BNP 330.7* 186.8* 193.7*   Basic Metabolic Panel: Recent Labs  Lab 06/14/19 0525 06/15/19 0429 06/17/19 1027 06/18/19 0134 06/19/19 0543  NA 130* 128* 129* 130* 132*  K 3.7 3.8 4.7 4.4 4.7  CL 94* 90* 93* 97* 103  CO2 27 29 23  21* 21*  GLUCOSE 197* 163* 129* 242* 219*  BUN 23 22 31* 29* 31*  CREATININE 0.94 0.96 1.84* 1.13 0.83  CALCIUM 7.9* 8.0* 7.2* 6.9* 7.9*   Liver Function Tests: Recent Labs  Lab 06/18/19 0134  AST 89*  ALT 109*  ALKPHOS 261*  BILITOT 1.0  PROT 5.0*  ALBUMIN 1.8*   No results for input(s): LIPASE, AMYLASE in the last 168 hours. No results for input(s): AMMONIA in the last 168 hours. CBC: Recent Labs  Lab 06/13/19 0251  06/15/19 0429 06/16/19 0733 06/16/19 1509 06/17/19 0257 06/18/19 0822 06/18/19 1935 06/19/19 0543  WBC 11.3*   < > 7.6 8.4  --  9.7 7.6  --  9.3  NEUTROABS 9.8*  --   --   --   --   --   --   --   --   HGB 8.2*   < > 8.1* 8.0* 8.5* 9.0* 6.9* 9.7* 11.5*  HCT 25.9*   < > 25.3* 25.3* 26.1* 28.9* 21.5* 31.1* 35.9*  MCV 93.8   < > 94.4 95.8  --  96.0 96.8  --  94.7  PLT 121*   < > 120* 132*  --  128* 91*  --  130*   < > = values in this interval not displayed.   Cardiac Enzymes: No results for input(s): CKTOTAL, CKMB, CKMBINDEX, TROPONINI in the last 168 hours. BNP: Invalid input(s): POCBNP CBG: Recent Labs  Lab 06/18/19 1137 06/18/19 1634 06/18/19 2111 06/19/19 0815 06/19/19 1207  GLUCAP 268* 295* 210* 198* 240*   D-Dimer No results for input(s): DDIMER in the last 72 hours. Hgb A1c No results for  input(s): HGBA1C in the last 72 hours. Lipid Profile No results for input(s): CHOL, HDL, LDLCALC, TRIG, CHOLHDL, LDLDIRECT in the last 72 hours. Thyroid function studies No results for input(s): TSH, T4TOTAL, T3FREE, THYROIDAB in the last 72 hours.  Invalid input(s): FREET3 Anemia work up No results for input(s): VITAMINB12, FOLATE, FERRITIN, TIBC, IRON, RETICCTPCT in the last 72 hours. Urinalysis    Component Value Date/Time   COLORURINE YELLOW 06/15/2019 [place] 06/15/2019 1604   LABSPEC 1.015 06/15/2019 1604   PHURINE 6.0 06/15/2019 [place] 06/15/2019 [place] 06/30/2010 [place] 06/15/2019 1604  BILIRUBINUR NEGATIVE 06/15/2019 [place] 06/15/2019 1604   PROTEINUR 100 (A) 06/15/2019 1604   UROBILINOGEN 0.2 06/30/2010 1050   NITRITE NEGATIVE 06/15/2019 1604   LEUKOCYTESUR NEGATIVE 06/15/2019 1604   Sepsis Labs Invalid input(s): PROCALCITONIN,  WBC,  LACTICIDVEN Microbiology Recent Results (from the past 240 hour(s))  SARS Coronavirus 2 (CEPHEID- Performed in [place] hospital lab), Hosp Order     Status: None   Collection Time: 06/10/19  5:28 PM   Specimen: Nasopharyngeal Swab  Result Value Ref Range Status   SARS Coronavirus 2 NEGATIVE NEGATIVE Final    Comment: (NOTE) If result is NEGATIVE SARS-CoV-2 target nucleic acids are NOT DETECTED. The SARS-CoV-2 RNA is generally detectable in upper and lower  respiratory specimens during the acute phase of infection. The lowest  concentration of SARS-CoV-2 viral copies this assay can detect is 250  copies / mL. A negative result does not preclude SARS-CoV-2 infection  and should not be used as the sole basis for treatment or other  patient management decisions.  A negative result may occur with  improper specimen collection / handling, submission of specimen other  than nasopharyngeal swab, presence of viral mutation(s) within the  areas  targeted by this assay, and inadequate number of viral copies  (<250 copies / mL). A negative result must be combined with clinical  observations, patient history, and epidemiological information. If result is POSITIVE SARS-CoV-2 target nucleic acids are DETECTED. The SARS-CoV-2 RNA is generally detectable in upper and lower  respiratory specimens dur ing the acute phase of infection.  Positive  results are indicative of active infection with SARS-CoV-2.  Clinical  correlation with patient history and other diagnostic information is  necessary to determine patient infection status.  Positive results do  not rule out bacterial infection or co-infection with other viruses. If result is PRESUMPTIVE POSTIVE SARS-CoV-2 nucleic acids MAY BE PRESENT.   A presumptive positive result was obtained on the submitted specimen  and confirmed on repeat testing.  While 2019 novel coronavirus  (SARS-CoV-2) nucleic acids may be present in the submitted sample  additional confirmatory testing may be necessary for epidemiological  and / or clinical management purposes  to differentiate between  SARS-CoV-2 and other Sarbecovirus currently known to infect humans.  If clinically indicated additional testing with an alternate test  methodology [phone]) is advised. The SARS-CoV-2 RNA is generally  detectable in upper and lower respiratory sp ecimens during the acute  phase of infection. The expected result is Negative. Fact Sheet for Patients:  StrictlyIdeas.no Fact Sheet for Healthcare Providers: BankingDealers.co.za This test is not yet approved or cleared by the [place] FDA and has been authorized for detection and/or diagnosis of SARS-CoV-2 by FDA under an Emergency Use Authorization (EUA).  This EUA will remain in effect (meaning this test can be used) for the duration of the COVID-19 declaration under Section 564(b)(1) of the Act, 21 U.S.C. section  360bbb-3(b)(1), unless the authorization is terminated or revoked sooner. Performed at [facility], [place] [address] [place], [place] [address]   Culture, blood (routine x 2)     Status: Abnormal   Collection Time: 06/10/19  5:28 PM   Specimen: Left Antecubital; Blood  Result Value Ref Range Status   Specimen Description   Final    LEFT ANTECUBITAL Performed at [facility] [address] [place], [place] [address]    Special Requests   Final    BOTTLES DRAWN AEROBIC AND ANAEROBIC Blood  Culture adequate volume Performed at [place] [address] [place], [place] [address]    Culture  Setup Time   Final    GRAM NEGATIVE RODS IN BOTH AEROBIC AND ANAEROBIC BOTTLES CRITICAL RESULT CALLED TO, READ BACK BY AND VERIFIED WITH: [place] [id number] FCP Performed at [facility], [place] [address] [place], [place] [address]    Culture ENTEROBACTER CLOACAE (A)  Final   Report Status 06/13/2019 FINAL  Final   Organism ID, Bacteria ENTEROBACTER CLOACAE  Final      Susceptibility   Enterobacter cloacae - MIC*    CEFAZOLIN >=64 RESISTANT Resistant     CEFEPIME 2 SENSITIVE Sensitive     CEFTAZIDIME >=64 RESISTANT Resistant     CEFTRIAXONE >=64 RESISTANT Resistant     CIPROFLOXACIN <=0.25 SENSITIVE Sensitive     GENTAMICIN <=1 SENSITIVE Sensitive     IMIPENEM <=0.25 SENSITIVE Sensitive     TRIMETH/SULFA <=20 SENSITIVE Sensitive     PIP/TAZO >=128 RESISTANT Resistant     * ENTEROBACTER CLOACAE  Blood Culture ID Panel (Reflexed)     Status: Abnormal   Collection Time: 06/10/19  5:28 PM  Result Value Ref Range Status   Enterococcus species NOT DETECTED NOT DETECTED Final   Listeria monocytogenes NOT DETECTED NOT DETECTED Final   Staphylococcus species NOT DETECTED NOT DETECTED Final   Staphylococcus aureus (BCID) NOT DETECTED NOT DETECTED Final   Streptococcus species NOT DETECTED NOT DETECTED Final    Streptococcus agalactiae NOT DETECTED NOT DETECTED Final   Streptococcus pneumoniae NOT DETECTED NOT DETECTED Final   Streptococcus pyogenes NOT DETECTED NOT DETECTED Final   Acinetobacter baumannii NOT DETECTED NOT DETECTED Final   Enterobacteriaceae species DETECTED (A) NOT DETECTED Final    Comment: Enterobacteriaceae represent a large family of gram-negative bacteria, not a single organism. CRITICAL RESULT CALLED TO, READ BACK BY AND VERIFIED WITH: [place] [phone] [id number] FCP    Enterobacter cloacae complex DETECTED (A) NOT DETECTED Final    Comment: CRITICAL RESULT CALLED TO, READ BACK BY AND VERIFIED WITH: [place] [phone] FCP    Escherichia coli NOT DETECTED NOT DETECTED Final   Klebsiella oxytoca NOT DETECTED NOT DETECTED Final   Klebsiella pneumoniae NOT DETECTED NOT DETECTED Final   Proteus species NOT DETECTED NOT DETECTED Final   Serratia marcescens NOT DETECTED NOT DETECTED Final   Carbapenem resistance NOT DETECTED NOT DETECTED Final   Haemophilus influenzae NOT DETECTED NOT DETECTED Final   Neisseria meningitidis NOT DETECTED NOT DETECTED Final   Pseudomonas aeruginosa NOT DETECTED NOT DETECTED Final   Candida albicans NOT DETECTED NOT DETECTED Final   Candida glabrata NOT DETECTED NOT DETECTED Final   Candida krusei NOT DETECTED NOT DETECTED Final   Candida parapsilosis NOT DETECTED NOT DETECTED Final   Candida tropicalis NOT DETECTED NOT DETECTED Final    Comment: Performed at [facility], [place] [address], [place], [place] [address]  Culture, blood (routine x 2)     Status: Abnormal   Collection Time: 06/10/19  5:33 PM   Specimen: BLOOD LEFT HAND  Result Value Ref Range Status   Specimen Description   Final    BLOOD LEFT HAND Performed at [facility] [address] [place], [place] [address]    Special Requests   Final    BOTTLES DRAWN AEROBIC AND ANAEROBIC Blood Culture adequate volume Performed at [facility] [address], [place],  [address]  Culture  Setup Time   Final    GRAM NEGATIVE RODS IN BOTH AEROBIC AND ANAEROBIC BOTTLES CRITICAL VALUE NOTED.  VALUE IS CONSISTENT WITH PREVIOUSLY REPORTED AND CALLED VALUE.    Culture (A)  Final    ENTEROBACTER CLOACAE SUSCEPTIBILITIES PERFORMED ON PREVIOUS CULTURE WITHIN THE LAST 5 DAYS. Performed at [facility], [place] [address], [place], [place] [address]    Report Status 06/13/2019 FINAL  Final  MRSA PCR Screening     Status: None   Collection Time: 06/10/19 10:46 PM   Specimen: Nasal Mucosa; Nasopharyngeal  Result Value Ref Range Status   MRSA by PCR NEGATIVE NEGATIVE Final    Comment:        The GeneXpert MRSA Assay (FDA approved for NASAL specimens only), is one component of a comprehensive MRSA colonization surveillance program. It is not intended to diagnose MRSA infection nor to guide or monitor treatment for MRSA infections. Performed at [facility], [place] [address], [place], [place] [address]   Urine culture     Status: Abnormal   Collection Time: 06/11/19 12:00 AM   Specimen: Urine, Random  Result Value Ref Range Status   Specimen Description   Final    URINE, RANDOM Performed at [place] [address] [place], [place] [address]    Special Requests   Final    NONE Performed at [facility], [place] [address], [place], [place] [address]    Culture (A)  Final    <10,000 COLONIES/mL INSIGNIFICANT GROWTH Performed at [place] [address], [place], [place] [address]    Report Status 06/12/2019 FINAL  Final  Culture, Urine     Status: None   Collection Time: 06/15/19  4:04 PM   Specimen: Urine, Clean Catch  Result Value Ref Range Status   Specimen Description   Final    URINE, CLEAN CATCH Performed at [facility], [place] [address] [place], [place] [address]    Special Requests   Final     NONE Performed at [facility], [place] [address], [place], [place] [address]    Culture   Final    NO GROWTH Performed at [facility], [place] [address], [place], [place] [address]    Report Status 06/17/2019 FINAL  Final  Culture, blood (routine x 2)     Status: None (Preliminary result)   Collection Time: 06/15/19  4:38 PM   Specimen: BLOOD RIGHT ARM  Result Value Ref Range Status   Specimen Description   Final    BLOOD RIGHT ARM Performed at [place] [address] [place], [place] [address]    Special Requests   Final    BOTTLES DRAWN AEROBIC ONLY Blood Culture adequate volume Performed at [place] [address] [place], [place] [address]    Culture   Final    NO GROWTH 4 DAYS Performed at [facility], [place] [address] [place], [place] [address]    Report Status PENDING  Incomplete  Culture, blood (routine x 2)     Status: None (Preliminary result)   Collection Time: 06/15/19  4:38 PM   Specimen: BLOOD RIGHT HAND  Result Value Ref Range Status   Specimen Description   Final    BLOOD RIGHT HAND Performed at [place] [address] [place],  [address]    Special Requests   Final  BOTTLES DRAWN AEROBIC ONLY Blood Culture results may not be optimal due to an inadequate volume of blood received in culture bottles Performed at [facility], [place] [address], [place], [place] [address]    Culture   Final    NO GROWTH 4 DAYS Performed at [facility], [place] [address], [place], [place] [address]    Report Status PENDING  Incomplete  SARS Coronavirus 2 [facility] order, Performed in [facility] hospital lab) Nasopharyngeal Nasopharyngeal Swab     Status: None   Collection Time: 06/17/19  9:30 AM   Specimen: Nasopharyngeal Swab  Result Value Ref Range Status   SARS Coronavirus 2 NEGATIVE NEGATIVE Final    Comment: (NOTE) If result is  NEGATIVE SARS-CoV-2 target nucleic acids are NOT DETECTED. The SARS-CoV-2 RNA is generally detectable in upper and lower  respiratory specimens during the acute phase of infection. The lowest  concentration of SARS-CoV-2 viral copies this assay can detect is 250  copies / mL. A negative result does not preclude SARS-CoV-2 infection  and should not be used as the sole basis for treatment or other  patient management decisions.  A negative result may occur with  improper specimen collection / handling, submission of specimen other  than nasopharyngeal swab, presence of viral mutation(s) within the  areas targeted by this assay, and inadequate number of viral copies  (<250 copies / mL). A negative result must be combined with clinical  observations, patient history, and epidemiological information. If result is POSITIVE SARS-CoV-2 target nucleic acids are DETECTED. The SARS-CoV-2 RNA is generally detectable in upper and lower  respiratory specimens dur ing the acute phase of infection.  Positive  results are indicative of active infection with SARS-CoV-2.  Clinical  correlation with patient history and other diagnostic information is  necessary to determine patient infection status.  Positive results do  not rule out bacterial infection or co-infection with other viruses. If result is PRESUMPTIVE POSTIVE SARS-CoV-2 nucleic acids MAY BE PRESENT.   A presumptive positive result was obtained on the submitted specimen  and confirmed on repeat testing.  While 2019 novel coronavirus  (SARS-CoV-2) nucleic acids may be present in the submitted sample  additional confirmatory testing may be necessary for epidemiological  and / or clinical management purposes  to differentiate between  SARS-CoV-2 and other Sarbecovirus currently known to infect humans.  If clinically indicated additional testing with an alternate test  methodology [phone]) is advised. The SARS-CoV-2 RNA is generally  detectable  in upper and lower respiratory sp ecimens during the acute  phase of infection. The expected result is Negative. Fact Sheet for Patients:  StrictlyIdeas.no Fact Sheet for Healthcare Providers: BankingDealers.co.za This test is not yet approved or cleared by the [place] FDA and has been authorized for detection and/or diagnosis of SARS-CoV-2 by FDA under an Emergency Use Authorization (EUA).  This EUA will remain in effect (meaning this test can be used) for the duration of the COVID-19 declaration under Section 564(b)(1) of the Act, 21 U.S.C. section 360bbb-3(b)(1), unless the authorization is terminated or revoked sooner. Performed at [facility], [place] [address], [place], [place] [address]      Patient was seen and examined on the day of discharge and was found to be in stable condition. Time coordinating discharge: 25 minutes including assessment and coordination of care, as well as examination of the patient.   SIGNED:  Dessa Phi, DO [facility] [email or web address] 06/19/2019, 1:33 PM

## 2019-06-20 ENCOUNTER — Telehealth: Payer: Self-pay | Admitting: *Deleted

## 2019-06-20 ENCOUNTER — Other Ambulatory Visit: Payer: Medicare Other | Admitting: *Deleted

## 2019-06-20 ENCOUNTER — Other Ambulatory Visit: Payer: Self-pay

## 2019-06-20 DIAGNOSIS — Z515 Encounter for palliative care: Secondary | ICD-10-CM

## 2019-06-20 LAB — CULTURE, BLOOD (ROUTINE X 2)
Culture: NO GROWTH
Culture: NO GROWTH
Special Requests: ADEQUATE

## 2019-06-20 NOTE — Progress Notes (Signed)
COMMUNITY PALLIATIVE CARE RN NOTE  PATIENT NAME: [name] DOB: 1946/04/16 MRN: [id number]  PRIMARY CARE PROVIDER: [name], MD  RESPONSIBLE PARTY: Cheri Kearns (wife) Acct ID - Guarantor Home Phone Work Phone Relationship Acct Type  [id number] [name]  Self P/F     [address], [place], [place] [address]   Covid-19 Pre-screening Negative  PLAN OF CARE and INTERVENTION:  1. ADVANCE CARE PLANNING/GOALS OF CARE: Goal is to remain at home with his wife. He says that he does not want to go back to the hospital 2. PATIENT/CAREGIVER EDUCATION: Pain management, Safe Transfers, Hospice Education 3. DISEASE STATUS: Received a call from patient's wife, [name], this am to advise that patient was discharged from the hospital yesterday and she would like a visit. Upon arrival, patient lying back in a recliner with his bi-pap machine on. He is grayish in appearance and very weak. Speech minimal and difficult to understand at times. When he returned home from the hospital yesterday evening, wife states that it took several family members to get him inside the house. He has had to sleep in his recliner chair as this is where he feels most comfortable. Recommended a Lift chair. She was able to get him to transfer to the bedside commode x 2 last pm, but it took a lot of effort and his legs almost gave out. They have now started using the urinal. His hands are jerky and he is having difficulty just holding a glass of water. He is experiencing pain across his abdomen with any movement and when coughing, as evidence by grimacing and moaning when this occurs.  He is taking PRN Tramadol and Tylenol to help. His cough is productive with yellow phlegm noted. He was diagnosed with Pneumonia prior to leaving the hospital. Wife recently increased his oxygen from 6L to 7L during transfers because of his sats dropping. I advised that while at rest that she could turn it back down to 6L since his sats  are currently 97%. Wheezing noted with exhalation bilaterally. He did a breathing treatment while I was present. His intake is poor. He ate a small container of sherbet today and a few sips of water. He states he doesn't have an appetite. Edema noted to his L hand and bilateral lower extremities, L>R. I discussed hospice again as I feel they will be able to offer additional support to both patient and wife. Patient still says is not ready to transition to hospice care. Lovena Le, CMA from Dr. Hoover Brunette office called while I was there. I was able to speak with her to make aware of his current condition and she will pass this information on to his NP and have a telehealth visit arranged with patient this week. At this point, I do not foresee him being able to make it in to get his labs drawn d/t his significant weakness and inability to walk. He had been going every week for labs since his Hgb has the tendency to drop very rapidly. His daughter is coming over this evening to offer additional personal care assistance. Will continue to monitor.   HISTORY OF PRESENT ILLNESS:  This is a 73 yo male who resides at home with his wife. He has had several hospitalizations over the past few months. Most recent hospitalization was 06/10/19 to 06/19/19 d/t increased dyspnea and fever. He was supposed to discharge on 8/1, but was found to have pneumonia. He is currently on oral antibiotics at home, Cipro and  Cefdinir. Next visit scheduled for tomorrow.  CODE STATUS: DNR ADVANCED DIRECTIVES: Y  MOST FORM: no PPS: 30%   PHYSICAL EXAM:   VITALS: Today's Vitals   06/20/19 1135  BP: 124/67  Pulse: 97  Resp: 18  Temp: 97.6 F (36.4 C)  TempSrc: Temporal  SpO2: 97%  PainSc: 6   PainLoc: Abdomen    LUNGS: expiratory wheezes bilaterally CARDIAC: Cor RRR EXTREMITIES: 4+ and pitting edema to bilateral lower extremities, swelling noted to L hand also SKIN: Grayish complexion, large bruise noted from L wrist to his  elbow, scattered bruising to bilateral arms/hands  NEURO: Alert and oriented x 3, minimally engaging, flat affect, drowsy, progressive weakness, now non-ambulatory   (Duration of visit and documentation 75 minutes)    [name], RN BSN

## 2019-06-20 NOTE — Telephone Encounter (Signed)
Received a call from patient's wife [name], after my visit with patient earlier today. [name] says that she is now ready to pursue hospice services for patient. I contacted Dr. Hoover Brunette office and spoke with Lovena Le, CMA to request a verbal order for a hospice consult from MD. Hospice consult obtained and Dr. Ardeth Perfect agrees to be the attending MD of record for hospice care. This information given to hospice medical director, Dr. Konrad Dolores, who will send patient referral to our hospice referral center. Wife appreciative.

## 2019-06-21 ENCOUNTER — Telehealth: Payer: Self-pay | Admitting: *Deleted

## 2019-06-21 NOTE — Telephone Encounter (Signed)
Received a phone call from patient's wife, [name], stating that patient is experiencing severe abdominal pain. He is currently taking Tramadol 50 mg, 2 tabs po every 6 hours around the clock, but wife does not feel this is adequately controlling patient's pain at this point. Contacted Dr. Hoover Brunette office and spoke with his CMA, Lovena Le. She advised that she spoke with MD and that he is prescribing Hydrocodone for patient to see if this is more effective and she will contact patient's wife to make her aware of this. Patient has a visit scheduled tomorrow, 06/22/19 with the hospice Admissions RN at 10:30a.

## 2019-06-22 DIAGNOSIS — J449 Chronic obstructive pulmonary disease, unspecified: Secondary | ICD-10-CM | POA: Diagnosis not present

## 2019-06-22 DIAGNOSIS — D630 Anemia in neoplastic disease: Secondary | ICD-10-CM | POA: Diagnosis not present

## 2019-06-22 DIAGNOSIS — I10 Essential (primary) hypertension: Secondary | ICD-10-CM | POA: Diagnosis not present

## 2019-06-22 DIAGNOSIS — C787 Secondary malignant neoplasm of liver and intrahepatic bile duct: Secondary | ICD-10-CM | POA: Diagnosis not present

## 2019-06-22 DIAGNOSIS — K219 Gastro-esophageal reflux disease without esophagitis: Secondary | ICD-10-CM | POA: Diagnosis not present

## 2019-06-22 DIAGNOSIS — I2510 Atherosclerotic heart disease of native coronary artery without angina pectoris: Secondary | ICD-10-CM | POA: Diagnosis not present

## 2019-06-22 DIAGNOSIS — C7800 Secondary malignant neoplasm of unspecified lung: Secondary | ICD-10-CM | POA: Diagnosis not present

## 2019-06-22 DIAGNOSIS — C679 Malignant neoplasm of bladder, unspecified: Secondary | ICD-10-CM | POA: Diagnosis not present

## 2019-06-23 DIAGNOSIS — I10 Essential (primary) hypertension: Secondary | ICD-10-CM | POA: Diagnosis not present

## 2019-06-23 DIAGNOSIS — C679 Malignant neoplasm of bladder, unspecified: Secondary | ICD-10-CM | POA: Diagnosis not present

## 2019-06-23 DIAGNOSIS — I2510 Atherosclerotic heart disease of native coronary artery without angina pectoris: Secondary | ICD-10-CM | POA: Diagnosis not present

## 2019-06-23 DIAGNOSIS — C787 Secondary malignant neoplasm of liver and intrahepatic bile duct: Secondary | ICD-10-CM | POA: Diagnosis not present

## 2019-06-23 DIAGNOSIS — J449 Chronic obstructive pulmonary disease, unspecified: Secondary | ICD-10-CM | POA: Diagnosis not present

## 2019-06-23 DIAGNOSIS — C7800 Secondary malignant neoplasm of unspecified lung: Secondary | ICD-10-CM | POA: Diagnosis not present

## 2019-06-24 DIAGNOSIS — C7800 Secondary malignant neoplasm of unspecified lung: Secondary | ICD-10-CM | POA: Diagnosis not present

## 2019-06-24 DIAGNOSIS — J449 Chronic obstructive pulmonary disease, unspecified: Secondary | ICD-10-CM | POA: Diagnosis not present

## 2019-06-24 DIAGNOSIS — C787 Secondary malignant neoplasm of liver and intrahepatic bile duct: Secondary | ICD-10-CM | POA: Diagnosis not present

## 2019-06-24 DIAGNOSIS — C679 Malignant neoplasm of bladder, unspecified: Secondary | ICD-10-CM | POA: Diagnosis not present

## 2019-06-24 DIAGNOSIS — I2510 Atherosclerotic heart disease of native coronary artery without angina pectoris: Secondary | ICD-10-CM | POA: Diagnosis not present

## 2019-06-24 DIAGNOSIS — I10 Essential (primary) hypertension: Secondary | ICD-10-CM | POA: Diagnosis not present

## 2019-06-25 ENCOUNTER — Telehealth: Payer: Self-pay | Admitting: *Deleted

## 2019-06-25 DIAGNOSIS — C679 Malignant neoplasm of bladder, unspecified: Secondary | ICD-10-CM | POA: Diagnosis not present

## 2019-06-25 DIAGNOSIS — I10 Essential (primary) hypertension: Secondary | ICD-10-CM | POA: Diagnosis not present

## 2019-06-25 DIAGNOSIS — C787 Secondary malignant neoplasm of liver and intrahepatic bile duct: Secondary | ICD-10-CM | POA: Diagnosis not present

## 2019-06-25 DIAGNOSIS — J449 Chronic obstructive pulmonary disease, unspecified: Secondary | ICD-10-CM | POA: Diagnosis not present

## 2019-06-25 DIAGNOSIS — I2510 Atherosclerotic heart disease of native coronary artery without angina pectoris: Secondary | ICD-10-CM | POA: Diagnosis not present

## 2019-06-25 DIAGNOSIS — C7800 Secondary malignant neoplasm of unspecified lung: Secondary | ICD-10-CM | POA: Diagnosis not present

## 2019-06-25 NOTE — Telephone Encounter (Signed)
Contacted and spoke with patient's wife, [name], for follow up. Patient was admitted to hospice services on 06/22/19 and is transferring to [facility] today.

## 2019-06-26 DIAGNOSIS — C679 Malignant neoplasm of bladder, unspecified: Secondary | ICD-10-CM | POA: Diagnosis not present

## 2019-06-26 DIAGNOSIS — C787 Secondary malignant neoplasm of liver and intrahepatic bile duct: Secondary | ICD-10-CM | POA: Diagnosis not present

## 2019-06-26 DIAGNOSIS — I10 Essential (primary) hypertension: Secondary | ICD-10-CM | POA: Diagnosis not present

## 2019-06-26 DIAGNOSIS — I2510 Atherosclerotic heart disease of native coronary artery without angina pectoris: Secondary | ICD-10-CM | POA: Diagnosis not present

## 2019-06-26 DIAGNOSIS — C7800 Secondary malignant neoplasm of unspecified lung: Secondary | ICD-10-CM | POA: Diagnosis not present

## 2019-06-26 DIAGNOSIS — J449 Chronic obstructive pulmonary disease, unspecified: Secondary | ICD-10-CM | POA: Diagnosis not present

## 2019-07-05 ENCOUNTER — Telehealth: Payer: Self-pay | Admitting: Pulmonary Disease

## 2019-07-05 NOTE — Telephone Encounter (Signed)
Called and spoke to pt's wife, [name]. She wanted to inform Dr. Valeta Harms of [name] passing away on 2019/07/17.   Will send to Dr. Valeta Harms as an Juluis Rainier.

## 2019-07-06 NOTE — Telephone Encounter (Signed)
PCCM:  Thank you for letting me know. We should inform medical records as well.   Garner Nash, DO [facility] 07/06/2019 9:30 AM

## 2019-07-09 ENCOUNTER — Telehealth: Payer: Self-pay | Admitting: Pulmonary Disease

## 2019-07-09 NOTE — Telephone Encounter (Signed)
07/09/19 Dr. Valeta Harms said this patient passed away on June 28, 2019 at their home. PWR

## 2019-07-09 NOTE — Telephone Encounter (Signed)
Called Medical records, spoke with [place].  Information of Patient passing given.  Larena Glassman stated she would note in chart. Nothing further needed at this time.

## 2019-07-17 DEATH — deceased

## 2020-11-21 IMAGING — DX PORTABLE CHEST - 1 VIEW
1 series · 1 of 1 positions shown · non-contrast
Comparison: 05/06/2019

CLINICAL DATA: Shortness of breath and fever for several hours

EXAM:
PORTABLE CHEST 1 VIEW

[chest ap]
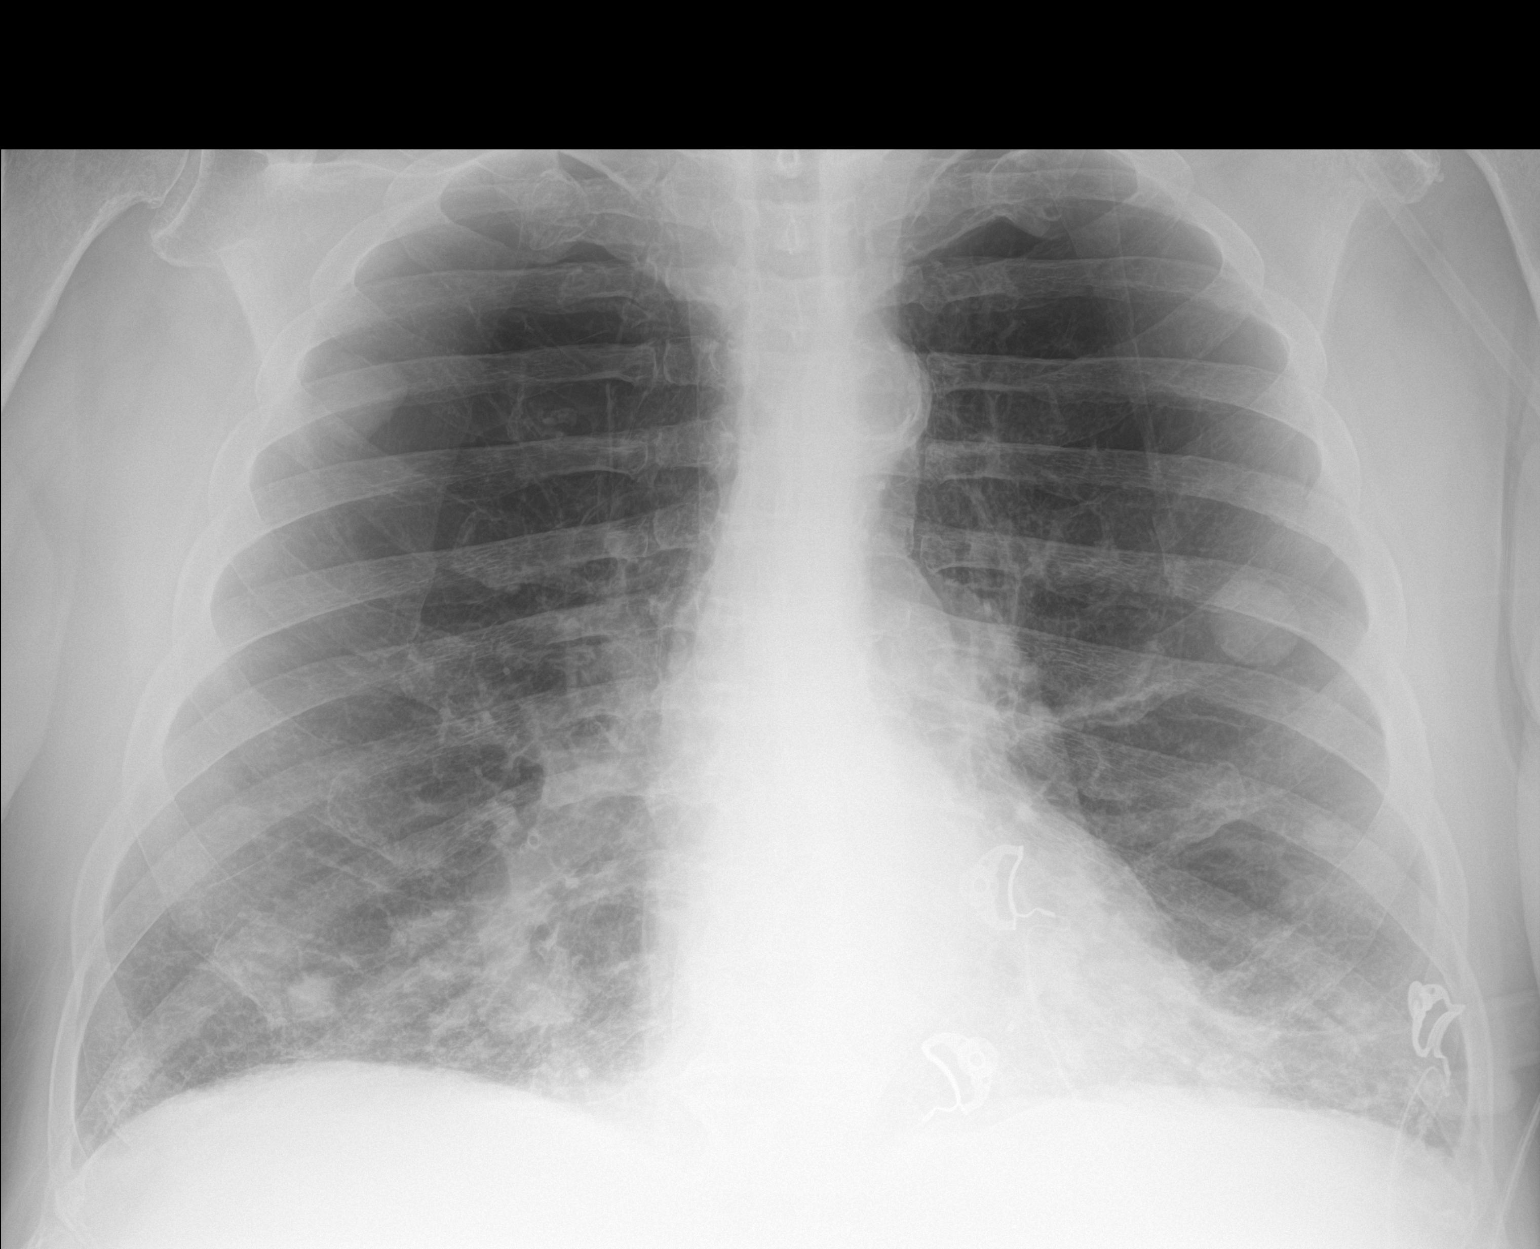

[1 of 1 positions shown; findings below may reference images not displayed]

FINDINGS: Cardiac shadow is stable. Aortic calcifications are seen. The lungs
are well aerated bilaterally with emphysematous changes and crowding
of the markings in the bases. Multiple bilateral pulmonary nodules
are seen. Slight increased density in the bases is noted when
compare with the prior exam likely representing some superimposed
atelectatic change.
IMPRESSION: Multifocal pulmonary nodules consistent with metastatic disease.

Mild increased atelectasis in the bases superimposed over changes of
COPD.

## 2024-08-06 NOTE — Progress Notes (Signed)
 SABRA
